# Patient Record
Sex: Male | Born: 1940 | Race: White | Hispanic: No | Marital: Married | Smoking: Former smoker
Health system: Southern US, Community
[De-identification: ages and names within clinical notes are randomized; demographics above are authoritative.]

## PROBLEM LIST (undated history)

## (undated) DIAGNOSIS — N183 Chronic kidney disease, stage 3 unspecified: Secondary | ICD-10-CM

## (undated) DIAGNOSIS — R519 Headache, unspecified: Secondary | ICD-10-CM

## (undated) DIAGNOSIS — M792 Neuralgia and neuritis, unspecified: Secondary | ICD-10-CM

## (undated) DIAGNOSIS — E785 Hyperlipidemia, unspecified: Secondary | ICD-10-CM

## (undated) DIAGNOSIS — I714 Abdominal aortic aneurysm, without rupture, unspecified: Secondary | ICD-10-CM

## (undated) DIAGNOSIS — I10 Essential (primary) hypertension: Secondary | ICD-10-CM

## (undated) DIAGNOSIS — C3490 Malignant neoplasm of unspecified part of unspecified bronchus or lung: Secondary | ICD-10-CM

## (undated) DIAGNOSIS — G44229 Chronic tension-type headache, not intractable: Secondary | ICD-10-CM

## (undated) DIAGNOSIS — R0600 Dyspnea, unspecified: Secondary | ICD-10-CM

## (undated) DIAGNOSIS — I723 Aneurysm of iliac artery: Secondary | ICD-10-CM

## (undated) DIAGNOSIS — Z951 Presence of aortocoronary bypass graft: Secondary | ICD-10-CM

## (undated) DIAGNOSIS — I729 Aneurysm of unspecified site: Secondary | ICD-10-CM

## (undated) DIAGNOSIS — E7800 Pure hypercholesterolemia, unspecified: Secondary | ICD-10-CM

## (undated) DIAGNOSIS — Z950 Presence of cardiac pacemaker: Secondary | ICD-10-CM

## (undated) DIAGNOSIS — S42402A Unspecified fracture of lower end of left humerus, initial encounter for closed fracture: Secondary | ICD-10-CM

## (undated) DIAGNOSIS — E538 Deficiency of other specified B group vitamins: Secondary | ICD-10-CM

## (undated) DIAGNOSIS — D649 Anemia, unspecified: Secondary | ICD-10-CM

## (undated) DIAGNOSIS — M1990 Unspecified osteoarthritis, unspecified site: Secondary | ICD-10-CM

## (undated) DIAGNOSIS — I441 Atrioventricular block, second degree: Secondary | ICD-10-CM

## (undated) DIAGNOSIS — I639 Cerebral infarction, unspecified: Secondary | ICD-10-CM

## (undated) DIAGNOSIS — M5136 Other intervertebral disc degeneration, lumbar region: Secondary | ICD-10-CM

## (undated) DIAGNOSIS — I519 Heart disease, unspecified: Secondary | ICD-10-CM

## (undated) DIAGNOSIS — Z860101 Personal history of adenomatous and serrated colon polyps: Secondary | ICD-10-CM

## (undated) DIAGNOSIS — I25810 Atherosclerosis of coronary artery bypass graft(s) without angina pectoris: Secondary | ICD-10-CM

## (undated) DIAGNOSIS — Z86010 Personal history of colonic polyps: Secondary | ICD-10-CM

## (undated) DIAGNOSIS — G459 Transient cerebral ischemic attack, unspecified: Secondary | ICD-10-CM

## (undated) DIAGNOSIS — M51369 Other intervertebral disc degeneration, lumbar region without mention of lumbar back pain or lower extremity pain: Secondary | ICD-10-CM

## (undated) DIAGNOSIS — I495 Sick sinus syndrome: Secondary | ICD-10-CM

## (undated) DIAGNOSIS — I2510 Atherosclerotic heart disease of native coronary artery without angina pectoris: Secondary | ICD-10-CM

## (undated) DIAGNOSIS — F419 Anxiety disorder, unspecified: Secondary | ICD-10-CM

## (undated) DIAGNOSIS — I38 Endocarditis, valve unspecified: Secondary | ICD-10-CM

## (undated) DIAGNOSIS — J449 Chronic obstructive pulmonary disease, unspecified: Secondary | ICD-10-CM

## (undated) DIAGNOSIS — Z872 Personal history of diseases of the skin and subcutaneous tissue: Secondary | ICD-10-CM

## (undated) DIAGNOSIS — I700 Atherosclerosis of aorta: Secondary | ICD-10-CM

## (undated) DIAGNOSIS — G3184 Mild cognitive impairment, so stated: Secondary | ICD-10-CM

## (undated) DIAGNOSIS — N261 Atrophy of kidney (terminal): Secondary | ICD-10-CM

## (undated) DIAGNOSIS — K219 Gastro-esophageal reflux disease without esophagitis: Secondary | ICD-10-CM

## (undated) DIAGNOSIS — Z902 Acquired absence of lung [part of]: Secondary | ICD-10-CM

## (undated) DIAGNOSIS — R51 Headache: Secondary | ICD-10-CM

## (undated) DIAGNOSIS — M4306 Spondylolysis, lumbar region: Secondary | ICD-10-CM

## (undated) DIAGNOSIS — C61 Malignant neoplasm of prostate: Secondary | ICD-10-CM

## (undated) DIAGNOSIS — Q383 Other congenital malformations of tongue: Secondary | ICD-10-CM

## (undated) DIAGNOSIS — G5710 Meralgia paresthetica, unspecified lower limb: Secondary | ICD-10-CM

## (undated) DIAGNOSIS — Z8719 Personal history of other diseases of the digestive system: Secondary | ICD-10-CM

## (undated) DIAGNOSIS — M5412 Radiculopathy, cervical region: Secondary | ICD-10-CM

## (undated) DIAGNOSIS — N200 Calculus of kidney: Secondary | ICD-10-CM

## (undated) HISTORY — DX: Anemia, unspecified: D64.9

## (undated) HISTORY — DX: Headache: R51

## (undated) HISTORY — PX: OTHER SURGICAL HISTORY: SHX169

## (undated) HISTORY — DX: Other intervertebral disc degeneration, lumbar region without mention of lumbar back pain or lower extremity pain: M51.369

## (undated) HISTORY — DX: Pure hypercholesterolemia, unspecified: E78.00

## (undated) HISTORY — DX: Neuralgia and neuritis, unspecified: M79.2

## (undated) HISTORY — DX: Atrioventricular block, second degree: I44.1

## (undated) HISTORY — PX: POSTERIOR LUMBAR FUSION: SHX6036

## (undated) HISTORY — PX: EYE SURGERY: SHX253

## (undated) HISTORY — DX: Gastro-esophageal reflux disease without esophagitis: K21.9

## (undated) HISTORY — PX: COLONOSCOPY: SHX174

## (undated) HISTORY — PX: CORONARY ARTERY BYPASS GRAFT: SHX141

## (undated) HISTORY — DX: Headache, unspecified: R51.9

## (undated) HISTORY — DX: Sick sinus syndrome: I49.5

## (undated) HISTORY — DX: Transient cerebral ischemic attack, unspecified: G45.9

## (undated) HISTORY — DX: Radiculopathy, cervical region: M54.12

## (undated) HISTORY — DX: Aneurysm of iliac artery: I72.3

## (undated) HISTORY — DX: Malignant neoplasm of unspecified part of unspecified bronchus or lung: C34.90

## (undated) HISTORY — DX: Atrophy of kidney (terminal): N26.1

## (undated) HISTORY — DX: Abdominal aortic aneurysm, without rupture: I71.4

## (undated) HISTORY — DX: Anxiety disorder, unspecified: F41.9

## (undated) HISTORY — PX: JOINT REPLACEMENT: SHX530

## (undated) HISTORY — DX: Malignant neoplasm of prostate: C61

## (undated) HISTORY — PX: PACEMAKER INSERTION: SHX728

## (undated) HISTORY — DX: Hyperlipidemia, unspecified: E78.5

## (undated) HISTORY — PX: PROSTATECTOMY: SHX69

## (undated) HISTORY — DX: Unspecified osteoarthritis, unspecified site: M19.90

## (undated) HISTORY — PX: INSERT / REPLACE / REMOVE PACEMAKER: SUR710

## (undated) HISTORY — DX: Meralgia paresthetica, unspecified lower limb: G57.10

## (undated) HISTORY — DX: Abdominal aortic aneurysm, without rupture, unspecified: I71.40

## (undated) HISTORY — PX: KNEE ARTHROSCOPY: SUR90

## (undated) HISTORY — PX: CATARACT EXTRACTION: SUR2

## (undated) HISTORY — PX: POLYPECTOMY: SHX149

## (undated) HISTORY — PX: TOTAL KNEE ARTHROPLASTY: SHX125

## (undated) HISTORY — DX: Heart disease, unspecified: I51.9

## (undated) HISTORY — DX: Cerebral infarction, unspecified: I63.9

## (undated) HISTORY — PX: TOTAL SHOULDER REPLACEMENT: SUR1217

## (undated) HISTORY — DX: Other intervertebral disc degeneration, lumbar region: M51.36

---

## 2004-04-29 ENCOUNTER — Other Ambulatory Visit: Payer: Self-pay

## 2004-05-01 DIAGNOSIS — Z951 Presence of aortocoronary bypass graft: Secondary | ICD-10-CM

## 2004-05-01 HISTORY — PX: CORONARY ARTERY BYPASS GRAFT: SHX141

## 2004-05-01 HISTORY — DX: Presence of aortocoronary bypass graft: Z95.1

## 2005-09-23 ENCOUNTER — Ambulatory Visit: Payer: Self-pay | Admitting: Internal Medicine

## 2006-11-03 ENCOUNTER — Ambulatory Visit: Payer: Self-pay | Admitting: Internal Medicine

## 2007-01-30 ENCOUNTER — Ambulatory Visit: Payer: Self-pay | Admitting: General Practice

## 2007-02-09 ENCOUNTER — Inpatient Hospital Stay: Payer: Self-pay | Admitting: General Practice

## 2007-02-13 ENCOUNTER — Encounter: Payer: Self-pay | Admitting: Internal Medicine

## 2007-03-06 ENCOUNTER — Encounter: Payer: Self-pay | Admitting: General Practice

## 2007-03-09 ENCOUNTER — Encounter: Payer: Self-pay | Admitting: General Practice

## 2007-03-09 ENCOUNTER — Encounter: Payer: Self-pay | Admitting: Internal Medicine

## 2007-04-09 ENCOUNTER — Encounter: Payer: Self-pay | Admitting: General Practice

## 2007-05-09 ENCOUNTER — Encounter: Payer: Self-pay | Admitting: General Practice

## 2007-06-09 ENCOUNTER — Encounter: Payer: Self-pay | Admitting: General Practice

## 2007-07-07 ENCOUNTER — Emergency Department: Payer: Self-pay | Admitting: Emergency Medicine

## 2008-01-26 ENCOUNTER — Emergency Department: Payer: Self-pay | Admitting: Unknown Physician Specialty

## 2008-02-01 ENCOUNTER — Ambulatory Visit: Payer: Self-pay | Admitting: General Practice

## 2008-02-20 ENCOUNTER — Encounter: Payer: Self-pay | Admitting: General Practice

## 2008-03-08 ENCOUNTER — Encounter: Payer: Self-pay | Admitting: General Practice

## 2008-04-23 ENCOUNTER — Ambulatory Visit: Payer: Self-pay | Admitting: Internal Medicine

## 2008-06-26 ENCOUNTER — Ambulatory Visit: Payer: Self-pay | Admitting: Unknown Physician Specialty

## 2009-09-17 ENCOUNTER — Ambulatory Visit: Payer: Self-pay | Admitting: Internal Medicine

## 2010-01-02 ENCOUNTER — Emergency Department: Payer: Self-pay | Admitting: Emergency Medicine

## 2010-01-14 ENCOUNTER — Ambulatory Visit: Payer: Self-pay | Admitting: Internal Medicine

## 2010-03-18 ENCOUNTER — Ambulatory Visit: Payer: Self-pay | Admitting: Internal Medicine

## 2010-10-26 ENCOUNTER — Inpatient Hospital Stay (HOSPITAL_COMMUNITY)
Admission: RE | Admit: 2010-10-26 | Discharge: 2010-10-27 | Payer: Self-pay | Source: Home / Self Care | Attending: Urology | Admitting: Urology

## 2010-10-26 ENCOUNTER — Encounter (INDEPENDENT_AMBULATORY_CARE_PROVIDER_SITE_OTHER): Payer: Self-pay | Admitting: Urology

## 2011-01-18 LAB — ABO/RH: ABO/RH(D): O POS

## 2011-01-18 LAB — BASIC METABOLIC PANEL
BUN: 20 mg/dL (ref 6–23)
CO2: 24 mEq/L (ref 19–32)
CO2: 25 mEq/L (ref 19–32)
Calcium: 8.9 mg/dL (ref 8.4–10.5)
Chloride: 103 mEq/L (ref 96–112)
Chloride: 106 mEq/L (ref 96–112)
Creatinine, Ser: 1.98 mg/dL — ABNORMAL HIGH (ref 0.4–1.5)
Creatinine, Ser: 2.02 mg/dL — ABNORMAL HIGH (ref 0.4–1.5)
GFR calc non Af Amer: 33 mL/min — ABNORMAL LOW (ref >60)
GFR calc non Af Amer: 36 mL/min — ABNORMAL LOW (ref >60)
Glucose, Bld: 107 mg/dL — ABNORMAL HIGH (ref 70–99)
Glucose, Bld: 169 mg/dL — ABNORMAL HIGH (ref 70–99)
Potassium: 4.3 mEq/L (ref 3.5–5.1)
Sodium: 138 mEq/L (ref 135–145)
Sodium: 140 mEq/L (ref 135–145)

## 2011-01-18 LAB — HEMOGLOBIN AND HEMATOCRIT, BLOOD
HCT: 33.5 % — ABNORMAL LOW (ref 39.0–52.0)
Hemoglobin: 12.6 g/dL — ABNORMAL LOW (ref 13.0–17.0)

## 2011-01-18 LAB — TYPE AND SCREEN
ABO/RH(D): O POS
Antibody Screen: NEGATIVE

## 2011-01-19 LAB — BASIC METABOLIC PANEL
CO2: 24 mEq/L (ref 19–32)
Calcium: 9.5 mg/dL (ref 8.4–10.5)
Chloride: 107 mEq/L (ref 96–112)
GFR calc Af Amer: 55 mL/min — ABNORMAL LOW (ref >60)
Sodium: 141 mEq/L (ref 135–145)

## 2011-01-19 LAB — CBC
MCH: 29.6 pg (ref 26.0–34.0)
RDW: 13.9 % (ref 11.5–15.5)

## 2011-01-19 LAB — SURGICAL PCR SCREEN
MRSA, PCR: NEGATIVE
Staphylococcus aureus: NEGATIVE

## 2011-04-22 ENCOUNTER — Ambulatory Visit: Payer: Self-pay | Admitting: Vascular Surgery

## 2011-07-15 ENCOUNTER — Ambulatory Visit: Payer: Self-pay | Admitting: General Practice

## 2011-07-29 ENCOUNTER — Inpatient Hospital Stay: Payer: Self-pay | Admitting: General Practice

## 2011-07-30 LAB — PATHOLOGY REPORT

## 2012-03-07 ENCOUNTER — Ambulatory Visit: Payer: Self-pay | Admitting: General Practice

## 2012-04-07 DIAGNOSIS — M5412 Radiculopathy, cervical region: Secondary | ICD-10-CM | POA: Insufficient documentation

## 2012-11-08 HISTORY — PX: LUNG REMOVAL, PARTIAL: SHX233

## 2012-11-16 ENCOUNTER — Ambulatory Visit: Payer: Self-pay | Admitting: Internal Medicine

## 2012-11-27 ENCOUNTER — Ambulatory Visit: Payer: Self-pay | Admitting: Nephrology

## 2012-12-09 HISTORY — PX: PACEMAKER INSERTION: SHX728

## 2012-12-20 ENCOUNTER — Ambulatory Visit: Payer: Self-pay | Admitting: Cardiology

## 2012-12-20 LAB — BASIC METABOLIC PANEL
BUN: 25 mg/dL — ABNORMAL HIGH (ref 7–18)
Chloride: 109 mmol/L — ABNORMAL HIGH (ref 98–107)
Co2: 26 mmol/L (ref 21–32)
Creatinine: 1.93 mg/dL — ABNORMAL HIGH (ref 0.60–1.30)
EGFR (Non-African Amer.): 34 — ABNORMAL LOW
Glucose: 92 mg/dL (ref 65–99)
Osmolality: 287 (ref 275–301)
Potassium: 4.7 mmol/L (ref 3.5–5.1)
Sodium: 142 mmol/L (ref 136–145)

## 2012-12-20 LAB — CBC WITH DIFFERENTIAL/PLATELET
Basophil %: 0.4 %
Eosinophil %: 0.7 %
MCHC: 33.3 g/dL (ref 32.0–36.0)
MCV: 91 fL (ref 80–100)
Monocyte #: 0.5 x10 3/mm (ref 0.2–1.0)
Neutrophil #: 5.9 10*3/uL (ref 1.4–6.5)
Platelet: 194 10*3/uL (ref 150–440)

## 2012-12-20 LAB — PROTIME-INR: Prothrombin Time: 12.6 secs (ref 11.5–14.7)

## 2012-12-26 ENCOUNTER — Ambulatory Visit: Payer: Self-pay | Admitting: Cardiology

## 2013-01-09 ENCOUNTER — Ambulatory Visit: Payer: Self-pay | Admitting: Internal Medicine

## 2013-01-10 ENCOUNTER — Ambulatory Visit: Payer: Self-pay | Admitting: Cardiothoracic Surgery

## 2013-01-16 ENCOUNTER — Ambulatory Visit: Payer: Self-pay | Admitting: Cardiothoracic Surgery

## 2013-01-17 ENCOUNTER — Ambulatory Visit: Payer: Self-pay | Admitting: Cardiothoracic Surgery

## 2013-01-17 LAB — PROTIME-INR: Prothrombin Time: 13.2 secs (ref 11.5–14.7)

## 2013-01-17 LAB — PLATELET COUNT: Platelet: 174 10*3/uL (ref 150–440)

## 2013-01-17 LAB — CREATININE, SERUM
EGFR (African American): 40 — ABNORMAL LOW
EGFR (Non-African Amer.): 35 — ABNORMAL LOW

## 2013-01-25 ENCOUNTER — Ambulatory Visit: Payer: Self-pay

## 2013-01-25 LAB — CBC WITH DIFFERENTIAL/PLATELET
Basophil %: 0.3 %
Eosinophil #: 0.1 10*3/uL (ref 0.0–0.7)
Eosinophil %: 1.5 %
HCT: 39.1 % — ABNORMAL LOW (ref 40.0–52.0)
HGB: 13 g/dL (ref 13.0–18.0)
MCH: 29.8 pg (ref 26.0–34.0)
MCHC: 33.3 g/dL (ref 32.0–36.0)
Monocyte #: 0.5 x10 3/mm (ref 0.2–1.0)
Neutrophil #: 5 10*3/uL (ref 1.4–6.5)
Platelet: 140 10*3/uL — ABNORMAL LOW (ref 150–440)
RDW: 14.1 % (ref 11.5–14.5)
WBC: 6.7 10*3/uL (ref 3.8–10.6)

## 2013-01-25 LAB — COMPREHENSIVE METABOLIC PANEL
Alkaline Phosphatase: 64 U/L (ref 50–136)
Bilirubin,Total: 0.4 mg/dL (ref 0.2–1.0)
Calcium, Total: 8.9 mg/dL (ref 8.5–10.1)
Co2: 28 mmol/L (ref 21–32)
Creatinine: 1.6 mg/dL — ABNORMAL HIGH (ref 0.60–1.30)
EGFR (African American): 49 — ABNORMAL LOW
Glucose: 115 mg/dL — ABNORMAL HIGH (ref 65–99)
Potassium: 4.4 mmol/L (ref 3.5–5.1)
SGPT (ALT): 24 U/L (ref 12–78)
Sodium: 139 mmol/L (ref 136–145)
Total Protein: 7.1 g/dL (ref 6.4–8.2)

## 2013-01-25 LAB — PROTIME-INR: Prothrombin Time: 12.9 secs (ref 11.5–14.7)

## 2013-01-25 LAB — APTT: Activated PTT: 28.1 secs (ref 23.6–35.9)

## 2013-01-31 ENCOUNTER — Inpatient Hospital Stay: Payer: Self-pay

## 2013-01-31 DIAGNOSIS — C3491 Malignant neoplasm of unspecified part of right bronchus or lung: Secondary | ICD-10-CM

## 2013-01-31 HISTORY — PX: LOBECTOMY: SHX5089

## 2013-01-31 HISTORY — PX: LUNG LOBECTOMY: SHX167

## 2013-01-31 HISTORY — DX: Malignant neoplasm of unspecified part of right bronchus or lung: C34.91

## 2013-01-31 LAB — CBC WITH DIFFERENTIAL/PLATELET
Eosinophil #: 0 10*3/uL (ref 0.0–0.7)
HCT: 36.2 % — ABNORMAL LOW (ref 40.0–52.0)
HGB: 11.9 g/dL — ABNORMAL LOW (ref 13.0–18.0)
RDW: 13.7 % (ref 11.5–14.5)
WBC: 13.8 10*3/uL — ABNORMAL HIGH (ref 3.8–10.6)

## 2013-02-01 LAB — CBC WITH DIFFERENTIAL/PLATELET
Basophil #: 0 10*3/uL (ref 0.0–0.1)
Eosinophil %: 0.2 %
HCT: 35.3 % — ABNORMAL LOW (ref 40.0–52.0)
Lymphocyte %: 12.7 %
MCH: 29.6 pg (ref 26.0–34.0)
MCHC: 34 g/dL (ref 32.0–36.0)
Neutrophil #: 6.9 10*3/uL — ABNORMAL HIGH (ref 1.4–6.5)
RBC: 4.05 10*6/uL — ABNORMAL LOW (ref 4.40–5.90)

## 2013-02-01 LAB — BASIC METABOLIC PANEL
Co2: 22 mmol/L (ref 21–32)
EGFR (Non-African Amer.): 41 — ABNORMAL LOW
Osmolality: 280 (ref 275–301)
Sodium: 138 mmol/L (ref 136–145)

## 2013-02-02 LAB — BASIC METABOLIC PANEL
Anion Gap: 6 — ABNORMAL LOW (ref 7–16)
BUN: 17 mg/dL (ref 7–18)
BUN: 18 mg/dL (ref 7–18)
Calcium, Total: 8.4 mg/dL — ABNORMAL LOW (ref 8.5–10.1)
Chloride: 106 mmol/L (ref 98–107)
Chloride: 105 mmol/L (ref 98–107)
Co2: 23 mmol/L (ref 21–32)
Creatinine: 1.62 mg/dL — ABNORMAL HIGH (ref 0.60–1.30)
EGFR (Non-African Amer.): 42 — ABNORMAL LOW
EGFR (Non-African Amer.): 45 — ABNORMAL LOW
Potassium: 4.2 mmol/L (ref 3.5–5.1)

## 2013-02-02 LAB — CBC WITH DIFFERENTIAL/PLATELET
Eosinophil #: 0.1 10*3/uL (ref 0.0–0.7)
HCT: 35.1 % — ABNORMAL LOW (ref 40.0–52.0)
MCH: 29.5 pg (ref 26.0–34.0)
Neutrophil %: 78.2 %
RDW: 13.9 % (ref 11.5–14.5)
WBC: 11.1 10*3/uL — ABNORMAL HIGH (ref 3.8–10.6)

## 2013-02-02 LAB — MAGNESIUM: Magnesium: 1.6 mg/dL — ABNORMAL LOW

## 2013-02-03 LAB — CBC WITH DIFFERENTIAL/PLATELET
Basophil #: 0 10*3/uL (ref 0.0–0.1)
Basophil %: 0.3 %
HCT: 33.9 % — ABNORMAL LOW (ref 40.0–52.0)
HGB: 11.4 g/dL — ABNORMAL LOW (ref 13.0–18.0)
MCH: 29.4 pg (ref 26.0–34.0)
MCHC: 33.6 g/dL (ref 32.0–36.0)
MCV: 88 fL (ref 80–100)
Monocyte #: 0.7 x10 3/mm (ref 0.2–1.0)
Neutrophil %: 79.3 %
Platelet: 146 10*3/uL — ABNORMAL LOW (ref 150–440)
RBC: 3.87 10*6/uL — ABNORMAL LOW (ref 4.40–5.90)
RDW: 13.9 % (ref 11.5–14.5)

## 2013-02-03 LAB — BASIC METABOLIC PANEL
Anion Gap: 6 — ABNORMAL LOW (ref 7–16)
BUN: 19 mg/dL — ABNORMAL HIGH (ref 7–18)
Calcium, Total: 8.4 mg/dL — ABNORMAL LOW (ref 8.5–10.1)
Creatinine: 1.42 mg/dL — ABNORMAL HIGH (ref 0.60–1.30)
Glucose: 109 mg/dL — ABNORMAL HIGH (ref 65–99)
Osmolality: 277 (ref 275–301)
Potassium: 4.1 mmol/L (ref 3.5–5.1)

## 2013-02-05 LAB — BASIC METABOLIC PANEL
Anion Gap: 6 — ABNORMAL LOW (ref 7–16)
Chloride: 106 mmol/L (ref 98–107)
Co2: 27 mmol/L (ref 21–32)
EGFR (Non-African Amer.): 56 — ABNORMAL LOW
Glucose: 105 mg/dL — ABNORMAL HIGH (ref 65–99)
Potassium: 3.5 mmol/L (ref 3.5–5.1)
Sodium: 139 mmol/L (ref 136–145)

## 2013-02-06 ENCOUNTER — Ambulatory Visit: Payer: Self-pay | Admitting: Cardiothoracic Surgery

## 2013-02-06 ENCOUNTER — Ambulatory Visit: Payer: Self-pay | Admitting: Oncology

## 2013-02-06 LAB — BASIC METABOLIC PANEL
BUN: 16 mg/dL (ref 7–18)
Chloride: 105 mmol/L (ref 98–107)
Creatinine: 1.26 mg/dL (ref 0.60–1.30)
EGFR (African American): >60
Glucose: 110 mg/dL — ABNORMAL HIGH (ref 65–99)
Osmolality: 279 (ref 275–301)
Potassium: 3.9 mmol/L (ref 3.5–5.1)

## 2013-02-06 LAB — CBC WITH DIFFERENTIAL/PLATELET
Basophil #: 0 10*3/uL (ref 0.0–0.1)
Basophil %: 0.5 %
HCT: 33.1 % — ABNORMAL LOW (ref 40.0–52.0)
HGB: 11.3 g/dL — ABNORMAL LOW (ref 13.0–18.0)
Lymphocyte %: 17.7 %
MCH: 29.9 pg (ref 26.0–34.0)
MCV: 88 fL (ref 80–100)
Monocyte #: 0.7 x10 3/mm (ref 0.2–1.0)
Monocyte %: 9.1 %
Neutrophil %: 69.9 %
Platelet: 189 10*3/uL (ref 150–440)
RDW: 14.1 % (ref 11.5–14.5)
WBC: 7.4 10*3/uL (ref 3.8–10.6)

## 2013-02-06 LAB — MAGNESIUM: Magnesium: 2 mg/dL

## 2013-03-08 ENCOUNTER — Ambulatory Visit: Payer: Self-pay | Admitting: Cardiothoracic Surgery

## 2013-04-23 LAB — COMPREHENSIVE METABOLIC PANEL
Anion Gap: 7 (ref 7–16)
Chloride: 107 mmol/L (ref 98–107)
Co2: 26 mmol/L (ref 21–32)
Creatinine: 1.74 mg/dL — ABNORMAL HIGH (ref 0.60–1.30)
Glucose: 88 mg/dL (ref 65–99)
Osmolality: 284 (ref 275–301)
Potassium: 4.2 mmol/L (ref 3.5–5.1)
Sodium: 140 mmol/L (ref 136–145)
Total Protein: 6.9 g/dL (ref 6.4–8.2)

## 2013-04-23 LAB — CBC
HCT: 38.1 % — ABNORMAL LOW (ref 40.0–52.0)
HGB: 12.7 g/dL — ABNORMAL LOW (ref 13.0–18.0)
MCH: 29.2 pg (ref 26.0–34.0)
MCHC: 33.3 g/dL (ref 32.0–36.0)
WBC: 6.9 10*3/uL (ref 3.8–10.6)

## 2013-04-23 LAB — TROPONIN I: Troponin-I: <0.02 ng/mL

## 2013-04-23 LAB — CK TOTAL AND CKMB (NOT AT ARMC)
CK, Total: 68 U/L (ref 35–232)
CK-MB: 3.2 ng/mL (ref 0.5–3.6)

## 2013-04-24 ENCOUNTER — Observation Stay: Payer: Self-pay | Admitting: Internal Medicine

## 2013-04-24 LAB — CK TOTAL AND CKMB (NOT AT ARMC)
CK, Total: 54 U/L (ref 35–232)
CK-MB: 2.1 ng/mL (ref 0.5–3.6)

## 2013-04-24 LAB — TROPONIN I: Troponin-I: <0.02 ng/mL

## 2013-06-27 ENCOUNTER — Ambulatory Visit: Payer: Self-pay | Admitting: Oncology

## 2013-06-27 LAB — COMPREHENSIVE METABOLIC PANEL
Alkaline Phosphatase: 61 U/L (ref 50–136)
Anion Gap: 3 — ABNORMAL LOW (ref 7–16)
BUN: 29 mg/dL — ABNORMAL HIGH (ref 7–18)
Calcium, Total: 9.2 mg/dL (ref 8.5–10.1)
Creatinine: 1.74 mg/dL — ABNORMAL HIGH (ref 0.60–1.30)
EGFR (African American): 44 — ABNORMAL LOW
Glucose: 74 mg/dL (ref 65–99)
SGOT(AST): 19 U/L (ref 15–37)
SGPT (ALT): 28 U/L (ref 12–78)
Sodium: 140 mmol/L (ref 136–145)

## 2013-06-27 LAB — CBC CANCER CENTER
Basophil #: 0 x10 3/mm (ref 0.0–0.1)
HCT: 38.5 % — ABNORMAL LOW (ref 40.0–52.0)
MCH: 30.5 pg (ref 26.0–34.0)
MCHC: 34.4 g/dL (ref 32.0–36.0)
Monocyte #: 0.6 x10 3/mm (ref 0.2–1.0)
Monocyte %: 11.5 %
Platelet: 139 x10 3/mm — ABNORMAL LOW (ref 150–440)
RBC: 4.35 10*6/uL — ABNORMAL LOW (ref 4.40–5.90)
RDW: 15 % — ABNORMAL HIGH (ref 11.5–14.5)
WBC: 5.2 x10 3/mm (ref 3.8–10.6)

## 2013-07-09 ENCOUNTER — Ambulatory Visit: Payer: Self-pay | Admitting: Oncology

## 2013-09-24 ENCOUNTER — Ambulatory Visit: Payer: Self-pay | Admitting: Oncology

## 2013-09-26 ENCOUNTER — Ambulatory Visit: Payer: Self-pay | Admitting: Oncology

## 2013-10-08 ENCOUNTER — Ambulatory Visit: Payer: Self-pay | Admitting: Oncology

## 2014-02-07 ENCOUNTER — Ambulatory Visit: Payer: Self-pay | Admitting: Unknown Physician Specialty

## 2014-02-08 LAB — PATHOLOGY REPORT

## 2014-03-26 ENCOUNTER — Ambulatory Visit: Payer: Self-pay | Admitting: Oncology

## 2014-03-27 LAB — CBC CANCER CENTER
Basophil #: 0 x10 3/mm (ref 0.0–0.1)
Basophil %: 0.4 %
Eosinophil #: 0.1 x10 3/mm (ref 0.0–0.7)
Eosinophil %: 1.9 %
HCT: 39.5 % — ABNORMAL LOW (ref 40.0–52.0)
HGB: 13.4 g/dL (ref 13.0–18.0)
Lymphocyte #: 1.3 x10 3/mm (ref 1.0–3.6)
Lymphocyte %: 26 %
MCH: 31 pg (ref 26.0–34.0)
MCHC: 34 g/dL (ref 32.0–36.0)
MCV: 91 fL (ref 80–100)
Monocyte #: 0.5 x10 3/mm (ref 0.2–1.0)
Monocyte %: 9.2 %
Neutrophil #: 3.2 x10 3/mm (ref 1.4–6.5)
Neutrophil %: 62.5 %
Platelet: 143 x10 3/mm — ABNORMAL LOW (ref 150–440)
RBC: 4.34 10*6/uL — ABNORMAL LOW (ref 4.40–5.90)
RDW: 13.7 % (ref 11.5–14.5)
WBC: 5.1 x10 3/mm (ref 3.8–10.6)

## 2014-03-27 LAB — COMPREHENSIVE METABOLIC PANEL
ALBUMIN: 3.6 g/dL (ref 3.4–5.0)
AST: 17 U/L (ref 15–37)
Alkaline Phosphatase: 63 U/L
Anion Gap: 6 — ABNORMAL LOW (ref 7–16)
BILIRUBIN TOTAL: 0.5 mg/dL (ref 0.2–1.0)
BUN: 20 mg/dL — AB (ref 7–18)
CHLORIDE: 107 mmol/L (ref 98–107)
Calcium, Total: 9.1 mg/dL (ref 8.5–10.1)
Co2: 30 mmol/L (ref 21–32)
Creatinine: 1.66 mg/dL — ABNORMAL HIGH (ref 0.60–1.30)
EGFR (African American): 47 — ABNORMAL LOW
EGFR (Non-African Amer.): 40 — ABNORMAL LOW
GLUCOSE: 102 mg/dL — AB (ref 65–99)
Osmolality: 288 (ref 275–301)
Potassium: 4.4 mmol/L (ref 3.5–5.1)
SGPT (ALT): 27 U/L (ref 12–78)
Sodium: 143 mmol/L (ref 136–145)
Total Protein: 6.7 g/dL (ref 6.4–8.2)

## 2014-04-08 ENCOUNTER — Ambulatory Visit: Payer: Self-pay | Admitting: Oncology

## 2014-08-01 DIAGNOSIS — M7530 Calcific tendinitis of unspecified shoulder: Secondary | ICD-10-CM | POA: Insufficient documentation

## 2014-10-09 ENCOUNTER — Ambulatory Visit: Payer: Self-pay | Admitting: Oncology

## 2014-10-09 LAB — COMPREHENSIVE METABOLIC PANEL
Albumin: 3.6 g/dL (ref 3.4–5.0)
Alkaline Phosphatase: 60 U/L
Anion Gap: 8 (ref 7–16)
BILIRUBIN TOTAL: 0.5 mg/dL (ref 0.2–1.0)
BUN: 21 mg/dL — ABNORMAL HIGH (ref 7–18)
CALCIUM: 9.1 mg/dL (ref 8.5–10.1)
CHLORIDE: 105 mmol/L (ref 98–107)
CO2: 29 mmol/L (ref 21–32)
Creatinine: 1.54 mg/dL — ABNORMAL HIGH (ref 0.60–1.30)
EGFR (Non-African Amer.): 47 — ABNORMAL LOW
GFR CALC AF AMER: 57 — AB
Glucose: 79 mg/dL (ref 65–99)
Osmolality: 285 (ref 275–301)
POTASSIUM: 4.5 mmol/L (ref 3.5–5.1)
SGOT(AST): 16 U/L (ref 15–37)
SGPT (ALT): 26 U/L
Sodium: 142 mmol/L (ref 136–145)
TOTAL PROTEIN: 6.6 g/dL (ref 6.4–8.2)

## 2014-10-09 LAB — CBC CANCER CENTER
BASOS PCT: 0.3 %
Basophil #: 0 x10 3/mm (ref 0.0–0.1)
Eosinophil #: 0.1 x10 3/mm (ref 0.0–0.7)
Eosinophil %: 2.3 %
HCT: 41.6 % (ref 40.0–52.0)
HGB: 13.7 g/dL (ref 13.0–18.0)
LYMPHS ABS: 1.2 x10 3/mm (ref 1.0–3.6)
LYMPHS PCT: 21.6 %
MCH: 30.8 pg (ref 26.0–34.0)
MCHC: 32.9 g/dL (ref 32.0–36.0)
MCV: 94 fL (ref 80–100)
Monocyte #: 0.6 x10 3/mm (ref 0.2–1.0)
Monocyte %: 10 %
NEUTROS ABS: 3.7 x10 3/mm (ref 1.4–6.5)
Neutrophil %: 65.8 %
Platelet: 147 x10 3/mm — ABNORMAL LOW (ref 150–440)
RBC: 4.44 10*6/uL (ref 4.40–5.90)
RDW: 13.8 % (ref 11.5–14.5)
WBC: 5.6 x10 3/mm (ref 3.8–10.6)

## 2014-10-16 ENCOUNTER — Ambulatory Visit: Payer: Self-pay | Admitting: Ophthalmology

## 2014-10-24 ENCOUNTER — Ambulatory Visit: Payer: Self-pay | Admitting: Oncology

## 2014-10-25 ENCOUNTER — Emergency Department: Payer: Self-pay | Admitting: Emergency Medicine

## 2014-10-25 LAB — CBC WITH DIFFERENTIAL/PLATELET
Basophil #: 0 10*3/uL (ref 0.0–0.1)
Basophil %: 0.3 %
EOS ABS: 0.2 10*3/uL (ref 0.0–0.7)
Eosinophil %: 2.7 %
HCT: 38.8 % — AB (ref 40.0–52.0)
HGB: 12.8 g/dL — AB (ref 13.0–18.0)
LYMPHS ABS: 0.9 10*3/uL — AB (ref 1.0–3.6)
Lymphocyte %: 16.3 %
MCH: 31.2 pg (ref 26.0–34.0)
MCHC: 33 g/dL (ref 32.0–36.0)
MCV: 95 fL (ref 80–100)
MONOS PCT: 8.8 %
Monocyte #: 0.5 x10 3/mm (ref 0.2–1.0)
Neutrophil #: 4.2 10*3/uL (ref 1.4–6.5)
Neutrophil %: 71.9 %
Platelet: 153 10*3/uL (ref 150–440)
RBC: 4.11 10*6/uL — ABNORMAL LOW (ref 4.40–5.90)
RDW: 13.5 % (ref 11.5–14.5)
WBC: 5.8 10*3/uL (ref 3.8–10.6)

## 2014-10-25 LAB — URINALYSIS, COMPLETE
BLOOD: NEGATIVE
Bacteria: NONE SEEN
Bilirubin,UR: NEGATIVE
Glucose,UR: NEGATIVE mg/dL (ref 0–75)
Ketone: NEGATIVE
Leukocyte Esterase: NEGATIVE
NITRITE: NEGATIVE
PROTEIN: NEGATIVE
Ph: 5 (ref 4.5–8.0)
SPECIFIC GRAVITY: 1.015 (ref 1.003–1.030)
Squamous Epithelial: 1
WBC UR: <1 /HPF (ref 0–5)

## 2014-10-25 LAB — COMPREHENSIVE METABOLIC PANEL
ALBUMIN: 3.2 g/dL — AB (ref 3.4–5.0)
Alkaline Phosphatase: 68 U/L
Anion Gap: 6 — ABNORMAL LOW (ref 7–16)
BILIRUBIN TOTAL: 0.3 mg/dL (ref 0.2–1.0)
BUN: 20 mg/dL — AB (ref 7–18)
CO2: 28 mmol/L (ref 21–32)
CREATININE: 1.64 mg/dL — AB (ref 0.60–1.30)
Calcium, Total: 8.6 mg/dL (ref 8.5–10.1)
Chloride: 108 mmol/L — ABNORMAL HIGH (ref 98–107)
EGFR (Non-African Amer.): 44 — ABNORMAL LOW
GFR CALC AF AMER: 53 — AB
Glucose: 108 mg/dL — ABNORMAL HIGH (ref 65–99)
Osmolality: 286 (ref 275–301)
Potassium: 4.1 mmol/L (ref 3.5–5.1)
SGOT(AST): 21 U/L (ref 15–37)
SGPT (ALT): 24 U/L
Sodium: 142 mmol/L (ref 136–145)
Total Protein: 6.2 g/dL — ABNORMAL LOW (ref 6.4–8.2)

## 2014-10-25 LAB — PROTIME-INR
INR: 1
Prothrombin Time: 13.1 secs (ref 11.5–14.7)

## 2014-10-25 LAB — TROPONIN I: Troponin-I: <0.02 ng/mL

## 2014-10-25 LAB — APTT: ACTIVATED PTT: 27.8 s (ref 23.6–35.9)

## 2014-11-04 ENCOUNTER — Ambulatory Visit: Payer: Self-pay | Admitting: Internal Medicine

## 2014-11-08 ENCOUNTER — Ambulatory Visit: Payer: Self-pay | Admitting: Oncology

## 2014-11-21 DIAGNOSIS — G939 Disorder of brain, unspecified: Secondary | ICD-10-CM | POA: Insufficient documentation

## 2014-11-21 DIAGNOSIS — I6782 Cerebral ischemia: Secondary | ICD-10-CM | POA: Insufficient documentation

## 2014-12-04 ENCOUNTER — Ambulatory Visit: Payer: Self-pay | Admitting: Ophthalmology

## 2014-12-12 ENCOUNTER — Encounter: Payer: Self-pay | Admitting: Neurology

## 2015-01-07 ENCOUNTER — Encounter
Admit: 2015-01-07 | Disposition: A | Discharge status: Home / Self Care | Payer: Self-pay | Attending: Neurology | Admitting: Neurology

## 2015-01-15 DIAGNOSIS — Z86010 Personal history of colonic polyps: Secondary | ICD-10-CM | POA: Insufficient documentation

## 2015-02-07 ENCOUNTER — Encounter
Admit: 2015-02-07 | Disposition: A | Discharge status: Home / Self Care | Payer: Self-pay | Attending: Neurology | Admitting: Neurology

## 2015-02-28 NOTE — H&P (Signed)
PATIENT NAME:  Rick Mcbride, Rick Mcbride MR#:  [id number] DATE OF BIRTH:  August 07, 1941  DATE OF ADMISSION:  04/24/2013  PRIMARY CARE PHYSICIAN: Fult[place] Reek.   REFERRING PHYSICIAN: Dr. Dahlia Client.   CHIEF COMPLAINT: Right arm pain.   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   The patient states had heavy work around the house today. Went to take a shovel. Started to experience heaviness in the right arm which has gradually worsened. At the same time also started to have some heaviness or numbness in the right leg. The patient came out of the shovel,  continued to have this weakness. Concerning this, EMS was called and he was brought to the Emergency Department. Work-up in the Emergency Department was with CT head without contrast, which was unremarkable. The patient has a history of CVA, which was found incidentally. Denies having any chest pain, palpitations, nausea, vomiting. Symptoms were completely resolved by the time the patient came to the Emergency Department.   PAST MEDICAL HISTORY: 1.  Osteoarthritis days.  2.  Anxiety disorder.  3.  Coronary artery disease, status post CABG.  4.  Biatrial lead replacements.  5.  Radical prostatectomy.  6.  Chronic kidney disease.  7.  Degenerative disk disease, with lumbar radiculopathy.  8.  Hyperlipidemia.  9.  Hypertension.  10.  Tension headaches.  11.   Meralgia paresthetica.   ALLERGIES: No known drug allergies.   HOME MEDICATIONS: 1.  Ambien 10 mg, once a day at bedtime.  2.  Vitamin B6, 1 tablet once daily.  3.  Tylenol 650 mg every 4 hours as needed.  4.  Trazodone 100 mg at bedtime.  5.  Demerol 50 mg every 6 hours as needed.  6.  Spectravite 1  tablet once a day.  7.  Refresh, 1 drop as needed.  8.  Protecta Vision 1 tablet 2 times a day.  9.  MiraLAX 17 grams as needed.  10.  Plavix 75 mg once a day.  11. Elavil 20 mg once a day.  12.  Niaspan ER 500 mg once a day.  13.  Mucinex 600 mg every 12 hours.  14.  Lisinopril 5 mg once a day.  15.  Cetirizine 10 mg once a day.  16.  Carisoprodol 250 mg 4 times a day as needed.  17.  Calcium 600, with vitamin D, 1 tablet once a day.  18.  Atorvastatin 40 mg once a day.   SOCIAL HISTORY: Smoked heavily in the past, 1 pack a day. Quit in 2013. Denies drinking alcohol or using illicit drugs. He is married. Lives with his wife.   FAMILY HISTORY: Father had a CVA, and died from a MI.   REVIEW OF SYSTEMS:  CONSTITUTIONAL: No generalized weakness, weight loss. EYES: No change in vision.  ENT: No change in hearing. No tinnitus.  RESPIRATORY: No cough, shortness of breath.  CARDIOVASCULAR: No chest pain, palpitations.  GASTROINTESTINAL: No nausea, vomiting, abdominal pain.  GENITOURINARY: No dysuria or hematuria.  SKIN: No rash or lesions.  HEMATOLOGIC: No easy bruising or bleeding.  MUSCULOSKELETAL: Has severe osteoarthritis.   NEUROLOGIC: No weakness or numbness at this time, however experienced right arm weakness and right arm heaviness.   PHYSICAL EXAMINATION: This is a  well-built,  well-nourished, age-appropriate male laying down in the bed, not in distress.  VITAL SIGNS: Temperature 98.7, pulse 85, blood pressure 138/72, respiratory rate of 18, oxygen saturations 100% on room air.  HEENT: Head normocephalic, atraumatic.  EYES: No scleral icterus. Conjunctivae normal. Pupils equal and reactive to light. Extraocular movements are intact. Mucous membranes moist.  NECK: Supple. No lymphadenopathy. No JVD. No carotid bruit. No thyromegaly.  CHEST: Has no focal tenderness. Bilaterally clear to auscultation.  HEART: S1, S2 regular. No murmurs are heard. No pedal edema. Pulses 2+.   ABDOMEN: Bowel sounds present. Soft, nontender, nondistended No hepatosplenomegaly.  SKIN: No rash or lesions.  MUSCULOSKELETAL: Has decreased range of motion in the left shoulder.  NEUROLOGIC: The patient is alert, oriented to place, person and time. Cranial nerves  II-XII intact. Motor 5/5 in upper and lower extremities.   LABS: CMP: BUN 28, creatinine of 1.74. The rest of the values are within normal limits.   CT head without contrast: Stable changes. Diffuse atrophy in the periventricular and subcortical white matter, consistent with chronic microvascular disease.   CBC is completely within normal limits.   EKG, 12-lead: Normal sinus rhythm, with no ST-T wave abnormalities.   ASSESSMENT AND PLAN: Rick Mcbride is a 74 year old male who comes to the Emergency Department with right arm weakness and heaviness.   1.  Transient ischemic attack: CT head shows chronic microvascular changes. Will do further workup considering the patient's risk factors and previous history of cerebrovascular accident. Will obtain MRI of the brain, echocardiogram and carotid Dopplers. The patient is on Plavix. Returns to add aspirin to his regimen. If workup is negative, the patient could be discharged home.  2. Hypertension: Currently well-controlled. Continue with home medications.   3.  Chronic kidney disease: patient's baseline. Will need close follow-up.  4.  Keep the patient on deep vein thrombosis prophylaxis with Lovenox.   Time spent: Forty-five minutes.    ____________________________ Monica Becton, MD pv:dm D: 04/24/2013 06:45:45 ET T: 04/24/2013 08:24:53 ET JOB#: [id number]  cc: Monica Becton, MD, <Dictator> Leonie Douglas. Doy Hutching, MD Monica Becton MD ELECTRONICALLY SIGNED 04/28/2013 14:34

## 2015-02-28 NOTE — Discharge Summary (Signed)
PATIENT NAME:  Rick Mcbride, Rick Mcbride MR#:  [id number] DATE OF BIRTH:  02/24/1941  DATE OF ADMISSION:  01/31/2013 DATE OF DISCHARGE:  02/06/2013  ADMITTING DIAGNOSIS:  Right lower lobe mass.   DISCHARGE DIAGNOSIS:  Squamous cell carcinoma of the lung.   HOSPITAL COURSE:  The patient is a 74 year old gentleman who was recently identified as having a right lower lobe mass highly suspicious for malignancy. He was brought to the operating room on January 31, 2013 at which time he underwent a right thoracotomy and right lower lobectomy. The final pathology did reveal a squamous cell carcinoma of the lung with no evidence of distant disease. The final stage was T1b N0 M0. The patient did well overnight in the Intensive Care Unit after surgery and he was subsequently transferred to the floor. His postoperative course was essentially unremarkable and he had his chest tubes removed on the day prior to his discharge. At the time of discharge, his wounds were healing as expected. He had no complicating features of his postoperative course. At the time of discharge, his medications included tramadol for pain as well as all of his usual home medications. These will be listed separately in his discharge summary.   The patient was scheduled to follow up with Dr. Genevive Bi in 1 week. He will have a chest x-ray made at that time.    ____________________________ Lew Dawes Genevive Bi, MD teo:si D: 02/15/2013 15:20:32 ET T: 02/15/2013 16:19:41 ET JOB#: [id number]  cc: Christia Reading E. Genevive Bi, MD, <Dictator> Louis Matte MD ELECTRONICALLY SIGNED 02/20/2013 11:45

## 2015-02-28 NOTE — Op Note (Signed)
PATIENT NAME:  Rick Mcbride, SIEFRING MR#:  [id number] DATE OF BIRTH:  01-26-1941  DATE OF PROCEDURE:  01/31/2013  SURGEON:  [place]  ASSISTANT:  Bronson Ing, MD  PREOPERATIVE DIAGNOSIS: Right lower lobe mass.  POSTOPERATIVE DIAGNOSIS:  Right lower lobe mass (frozen section consistent with squamous cell carcinoma).   OPERATIONS PERFORMED: 1.  Preoperative bronchoscopy to assess endobronchial anatomy.  2.  Right thoracotomy with right lower lobectomy.   INDICATIONS FOR PROCEDURE: The patient is a 74 year old gentleman with a recently discovered right lower lobe mass. This was PET positive and an attempt at a biopsy was unsuccessful. He was offered the above-named procedure after undergoing extensive evaluation. He gave his informed consent.   DESCRIPTION OF PROCEDURE: The patient was brought to the operating suite and placed in the supine position. General endotracheal anesthesia was given with a double-lumen tube. Preoperative bronchoscopy was carried out. This was normal to the subsegmental levels bilaterally. The patient was then turned for a right thoracotomy. All pressure points were carefully padded. The patient was prepped and draped in the usual sterile fashion. A posterolateral 5th interspace thoracotomy was performed. The latissimus muscle was divided but the serratus was spared. The chest was entered. There were some adhesions present throughout the chest and these were taken down using electrocautery. The inferior pulmonary ligament was freed up to the level of the inferior pulmonary vein. At that point we could then see that there was a palpable mass measuring approximately 2.5 cm in the lower lobe. This was wedged and sent for frozen section. Frozen section was consistent with squamous cell carcinoma. We elected to proceed on with a right lower lobectomy. The fissure between the lower lobe and the middle and upper lobes was incomplete. We began by identifying the pulmonary artery in the  depths of the fissure and then used a stapling device to complete the fissure posteriorly between the upper and lower lobes and anteriorly between the upper and middle lobes. Care was taken to avoid injury to any of the other vascular structures. The pulmonary artery consisted of 2 large branches. The superior segmental branch quickly bifurcated as did the basilar branch. These were taken separately with vascular staplers. The inferior pulmonary vein was encircled and also taken with a vascular stapler. The only remaining structure at this point was the bronchus. Because of its short length, it immediately bifurcated into the superior segmental bronchus and the basilar bronchi and these were taken separately, again with an endoscopic stapler. The middle lobe and upper lobe were nearly completely fused. We checked the bronchial stumps at 30 cm of water pressure and there was no air leak. There was some air leak along the fissures. 4 mL of ProGEL were used on these areas. The chest was drained with an angled and a straight 28-French chest tube brought out through separate stab wounds inferiorly. The subcarinal space was opened and a single lymph node was available. The pretracheal space was opened and despite our attempting to find any lymph nodes, there were none that could be identified. The chest was then closed in standard fashion. #2 Vicryl paracostal sutures approximated the ribs. The latissimus muscle was approximated with #2 Vicryl, the subcutaneous tissues with 2-0 Vicryl and the skin with skin clips. The prior access port was closed with 0 Vicryl with 2-0 Vicryl and nylon. The patient tolerated the procedure well, was awakened from general endotracheal anesthesia and was transported to the recovery room in stable condition.    ____________________________  Azure Barrales E. Genevive Bi, MD teo:ce D: 01/31/2013 18:30:04 ET T: 01/31/2013 18:49:08 ET JOB#: [id number]  cc: Christia Reading E. Genevive Bi, MD, <Dictator>  Dr. Fulton Reek  Dr. Nehemiah Massed, cardiology   Louis Matte MD ELECTRONICALLY SIGNED 02/13/2013 14:06

## 2015-02-28 NOTE — Op Note (Signed)
PATIENT NAME:  Rick Mcbride, Rick Mcbride MR#:  [id number] DATE OF BIRTH:  08/11/1941  DATE OF PROCEDURE:  12/26/2012  PRIMARY CARE PHYSICIAN:  [place]  PREPROCEDURE DIAGNOSIS:  Mobitz II second-degree AV block.  PROCEDURE:  Dual-chamber pacemaker implantation.  POSTPROCEDURE DIAGNOSIS:  Atrial sensing with ventricular pacing.  INDICATIONS:  The patient is a 74 year old gentleman who recently experienced a brief syncopal episode.  Holter monitor episodes of Mobitz II second-degree AV block.  Procedure, risks, benefits and alternatives of permanent pacemaker implantation were explained to the patient and informed written consent was obtained.  He was brought to the operating room in a fasting state. The left pectoral region was prepped and draped in the usual sterile manner. Anesthesia was obtained with 1% Xylocaine locally. A 6 cm incision was performed over the left pectoral region. Pacemaker pocket was generated by electrocautery and blunt dissection.  [employer]onic ventricular [phone]) and atrial ([id number]) leads were positioned at the right ventricular apex and right atrial appendage under fluoroscopic guidance. After proper thresholds were obtained, the leads were sutured in place. The pacemaker pocket was irrigated with gentamine solution. The leads were then connected to a dual chamber rate responsive pacemaker generator (Medtronic Adapta [id number]) and positioned into the pocket. The pocket was closed with 2-0 and 4-0 Vicryl, respectively. Steri-Strips and pressure dressing were applied.   ____________________________ Isaias Cowman, MD ap:ce D: 12/26/2012 13:36:00 ET T: 12/26/2012 14:00:37 ET JOB#: [id number]  cc: Isaias Cowman, MD, <Dictator> Isaias Cowman MD ELECTRONICALLY SIGNED 01/02/2013 15:00

## 2015-02-28 NOTE — Consult Note (Signed)
   Present Illness 74 y/o male with hx of CAD- s/p CABG, HTN,Hypelipidemia, Hx of AAA, Recent pacemaker for second degree Av Block - underwent Thoracotomy and Rt lower lobe resection for lung mass earlier today/\. C/o Chills and pain in Rt shoulder. Otherwise alert and awake and does not appear to be in distress    No Known Allergies:   Case History and Physical Exam:  Past Medical Health Coronary Artery Disease, Hypertension, Cancer, hyperlipidemia   Past Surgical History Coronary Artery Bypass Graft  pacemaker   HEENT PERLA   Chest/Lungs Clear  Rt chest tube. No air leak   Cardiovascular No Murmurs or Gallops   Abdomen Benign   Skin Warm   Nursing/Ancillary Notes: **Vital Signs.:   26-Mar-14 18:52  Vital Signs Type Admission  Pulse Pulse 74  Respirations Respirations 8  Systolic BP Systolic BP 97  Diastolic BP (mmHg) Diastolic BP (mmHg) 63  Mean BP 74  Pulse Ox % Pulse Ox % 100  Pulse Ox Activity Level  At rest  Oxygen Delivery 2L  Pulse Ox Heart Rate 74   XRay:    26-Mar-14 15:56, Chest Portable Single View  Chest Portable Single View   PRELIMINARY REPORT    The following is a PRELIMINARY Radiology report.  A final report will follow pending radiologist verification.      REASON FOR EXAM:    assess for pleural effusion  COMMENTS:       PROCEDURE: DXR- DXR PORTABLE CHEST SINGLE VIEW  - Jan 31 2013  3:56PM     RESULT:     Findings: Chest tube is identified within the right lung apex and right   lung base. There is no evidence of an appreciable pneumothorax. No focal   regions of consolidation or focal infiltrates appreciated. The cardiac   silhouette and visualized bony skeleton is unremarkable. There is volume   loss within the right hemithorax. Skin staples are identified on the   right.     IMPRESSION:  Post surgical changes in the right hemithorax without     evidence of acute cardiopulmonary abnormalities. There does not appear to   be an appreciable  pneumothorax.      Thank you for this opportunity to contribute to the care of your patient.         Dictated By: Mikki Santee, M.D., MD    Impression S/p Rt thoracotomy and Rt lower lobe resection for Lung mass. Pt is doing well post-op Coninue pain management, O2  Chest tube management as per Dr. Faith Rogue HTN: Hold Lisinopril at this time -Can restart in am -BP permitting  CKD: Continue to monitor renal function CAD- s/p CABG -Can resume Plavix,Aspirin and statins in am   Electronic Signatures: Tracie Harrier (MD)  (Signed 26-Mar-14 19:15)  Authored: General Aspect/Present Illness, Allergies, History and Physical Exam, Vital Signs, Radiology, Impression/Plan   Last Updated: 26-Mar-14 19:15 by Tracie Harrier (MD)

## 2015-04-04 ENCOUNTER — Ambulatory Visit
Admission: RE | Admit: 2015-04-04 | Discharge: 2015-04-04 | Disposition: A | Discharge status: Home / Self Care | Payer: PPO | Source: Ambulatory Visit | Attending: Oncology | Admitting: Oncology

## 2015-04-04 ENCOUNTER — Other Ambulatory Visit: Payer: Self-pay | Admitting: *Deleted

## 2015-04-04 ENCOUNTER — Ambulatory Visit
Admission: RE | Admit: 2015-04-04 | Discharge: 2015-04-04 | Disposition: A | Discharge status: Home / Self Care | Payer: PPO | Source: Ambulatory Visit | Attending: Family Medicine | Admitting: Family Medicine

## 2015-04-04 DIAGNOSIS — J439 Emphysema, unspecified: Secondary | ICD-10-CM | POA: Diagnosis not present

## 2015-04-04 DIAGNOSIS — J984 Other disorders of lung: Secondary | ICD-10-CM | POA: Diagnosis not present

## 2015-04-04 DIAGNOSIS — C3490 Malignant neoplasm of unspecified part of unspecified bronchus or lung: Secondary | ICD-10-CM

## 2015-04-04 DIAGNOSIS — Z08 Encounter for follow-up examination after completed treatment for malignant neoplasm: Secondary | ICD-10-CM | POA: Diagnosis present

## 2015-04-09 ENCOUNTER — Inpatient Hospital Stay (HOSPITAL_BASED_OUTPATIENT_CLINIC_OR_DEPARTMENT_OTHER): Payer: PPO | Admitting: Oncology

## 2015-04-09 ENCOUNTER — Inpatient Hospital Stay: Payer: PPO | Attending: Oncology

## 2015-04-09 ENCOUNTER — Encounter: Payer: Self-pay | Admitting: Oncology

## 2015-04-09 VITALS — BP 146/84 | HR 80 | Temp 95.2°F | Wt 202.6 lb

## 2015-04-09 DIAGNOSIS — C3431 Malignant neoplasm of lower lobe, right bronchus or lung: Secondary | ICD-10-CM

## 2015-04-09 DIAGNOSIS — Z79899 Other long term (current) drug therapy: Secondary | ICD-10-CM | POA: Insufficient documentation

## 2015-04-09 DIAGNOSIS — Z8546 Personal history of malignant neoplasm of prostate: Secondary | ICD-10-CM | POA: Diagnosis not present

## 2015-04-09 DIAGNOSIS — C3490 Malignant neoplasm of unspecified part of unspecified bronchus or lung: Secondary | ICD-10-CM

## 2015-04-09 LAB — COMPREHENSIVE METABOLIC PANEL
ALT: 19 U/L (ref 17–63)
AST: 19 U/L (ref 15–41)
Albumin: 3.9 g/dL (ref 3.5–5.0)
Alkaline Phosphatase: 53 U/L (ref 38–126)
Anion gap: 4 — ABNORMAL LOW (ref 5–15)
BUN: 25 mg/dL — AB (ref 6–20)
CO2: 28 mmol/L (ref 22–32)
CREATININE: 1.64 mg/dL — AB (ref 0.61–1.24)
Calcium: 9 mg/dL (ref 8.9–10.3)
Chloride: 104 mmol/L (ref 101–111)
GFR calc Af Amer: 46 mL/min — ABNORMAL LOW (ref >60)
GFR calc non Af Amer: 40 mL/min — ABNORMAL LOW (ref >60)
GLUCOSE: 121 mg/dL — AB (ref 65–99)
Potassium: 4.1 mmol/L (ref 3.5–5.1)
SODIUM: 136 mmol/L (ref 135–145)
TOTAL PROTEIN: 6.5 g/dL (ref 6.5–8.1)
Total Bilirubin: 0.7 mg/dL (ref 0.3–1.2)

## 2015-04-09 LAB — CBC WITH DIFFERENTIAL/PLATELET
BASOS ABS: 0 10*3/uL (ref 0–0.1)
Basophils Relative: 1 %
EOS ABS: 0.1 10*3/uL (ref 0–0.7)
Eosinophils Relative: 1 %
HCT: 41.3 % (ref 40.0–52.0)
Hemoglobin: 13.6 g/dL (ref 13.0–18.0)
LYMPHS ABS: 1.2 10*3/uL (ref 1.0–3.6)
LYMPHS PCT: 19 %
MCH: 30.8 pg (ref 26.0–34.0)
MCHC: 33 g/dL (ref 32.0–36.0)
MCV: 93.2 fL (ref 80.0–100.0)
MONOS PCT: 8 %
Monocytes Absolute: 0.5 10*3/uL (ref 0.2–1.0)
NEUTROS ABS: 4.5 10*3/uL (ref 1.4–6.5)
NEUTROS PCT: 71 %
Platelets: 136 10*3/uL — ABNORMAL LOW (ref 150–440)
RBC: 4.43 MIL/uL (ref 4.40–5.90)
RDW: 14 % (ref 11.5–14.5)
WBC: 6.2 10*3/uL (ref 3.8–10.6)

## 2015-04-09 NOTE — Progress Notes (Signed)
Patient former smoker.  Does have living will.

## 2015-04-12 ENCOUNTER — Encounter: Payer: Self-pay | Admitting: Oncology

## 2015-04-12 DIAGNOSIS — I10 Essential (primary) hypertension: Secondary | ICD-10-CM | POA: Insufficient documentation

## 2015-04-12 DIAGNOSIS — N261 Atrophy of kidney (terminal): Secondary | ICD-10-CM | POA: Insufficient documentation

## 2015-04-12 DIAGNOSIS — C3490 Malignant neoplasm of unspecified part of unspecified bronchus or lung: Secondary | ICD-10-CM | POA: Insufficient documentation

## 2015-04-12 DIAGNOSIS — D649 Anemia, unspecified: Secondary | ICD-10-CM | POA: Insufficient documentation

## 2015-04-12 DIAGNOSIS — C61 Malignant neoplasm of prostate: Secondary | ICD-10-CM | POA: Insufficient documentation

## 2015-04-12 DIAGNOSIS — K219 Gastro-esophageal reflux disease without esophagitis: Secondary | ICD-10-CM | POA: Insufficient documentation

## 2015-04-12 NOTE — Progress Notes (Signed)
[place] @ [facility] [phone]  Fax:([phone]) [place]: [birth year]  MR#: [id number]  UMP#:[id number]  Patient Care Team: Idelle Crouch, MD as PCP - General (Internal Medicine)  CHIEF COMPLAINT:  Chief Complaint  Patient presents with  . 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  All the margins are clear No blood vessel invasion     Cancer of lung   04/12/2015 Initial Diagnosis Cancer of lung    No flowsheet data found.  INTERVAL HISTORY: 74 year old gentleman in the history of carcinoma of right lower lobe lung status post resection came today for a follow-up.  Since last evaluation patient had developed right-sided stroke from which he is recovering. No cough or shortness of breath appetite remains stable  REVIEW OF SYSTEMS:   GENERAL:  Feels good.  Active.  No fevers, sweats or weight loss. PERFORMANCE STATUS (ECOG):  01 HEENT:  No visual changes, runny nose, sore throat, mouth sores or tenderness. Lungs: No shortness of breath or cough.  No hemoptysis. Cardiac:  No chest pain, palpitations, orthopnea, or PND. GI:  No nausea, vomiting, diarrhea, constipation, melena or hematochezia. GU:  No urgency, frequency, dysuria, or hematuria. Musculoskeletal:  No back pain.  No joint pain.  No muscle tenderness. Extremities:  No pain or swelling. Skin:  No rashes or skin changes. Neuro:  Patient developed stroke and was admitted in hospital from which patient is recovering Endocrine:  No diabetes, thyroid issues, hot flashes or night sweats. Psych:  No mood changes, depression or anxiety. Pain:  No focal pain. Review of systems:  All other systems reviewed and found to be negative. As per HPI. Otherwise, a complete review of systems is negatve.  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  All the margins are clear No blood vessel invasion ADVANCED DIRECTIVES:  Patient does have advanced health care directive  HEALTH MAINTENANCE: History  Substance Use Topics  . Smoking status: Former Research scientist (life sciences)  . Smokeless tobacco: Not on file  . Alcohol Use: Not on file      No Known Allergies  Current Outpatient Prescriptions  Medication Sig Dispense Refill  . atorvastatin (LIPITOR) 40 MG tablet Take by mouth.    . carisoprodol (SOMA) 350 MG tablet Take by mouth.    . clopidogrel (PLAVIX) 75 MG tablet Take by mouth.    Marland Kitchen lisinopril (PRINIVIL,ZESTRIL) 5 MG tablet Take by mouth.    . niacin (NIASPAN) 500 MG CR tablet Take by mouth.    Marland Kitchen omeprazole (PRILOSEC) 20 MG capsule Take by mouth.    . traMADol-acetaminophen (ULTRACET) 37.5-325 MG per tablet TAKE ONE TABLET EVERY 4 HOURS AS NEEDED FOR PAIN    . traZODone (DESYREL) 100 MG tablet Take by mouth.    . zolpidem (AMBIEN) 10 MG tablet Take by mouth.    Marland Kitchen aspirin EC 81 MG tablet Take by mouth.    . calcium citrate-vitamin D (CITRACAL+D) 315-200 MG-UNIT per tablet Take by mouth.    . clotrimazole-betamethasone (LOTRISONE) lotion Apply topically.    . Cyanocobalamin 1000 MCG TBCR Take by mouth.    . ferrous sulfate (SLOW FE) 160 (50 FE) MG TBCR SR tablet Take by mouth.    . Multiple Vitamins-Minerals (CENTRUM SILVER ULTRA MENS) TABS Take by mouth.    Marland Kitchen  Multiple Vitamins-Minerals (PRESERVISION/LUTEIN) CAPS Take by mouth.    . Omega-3 Fatty Acids (FISH OIL) 1200 MG CAPS Take by mouth.    . polyethylene glycol (MIRALAX / GLYCOLAX) packet Take by mouth.     No current facility-administered medications for this visit.  1 Significant History/PMH:   Right Shoulder Hemiarthroplasty: 28-Jul-2011   PVD - Peripheral Vascular Disease:    Hypertension:    tension headaches:    meralgia parensthetica:    hyperlipidemia:    degenerative disk disease with lumbar radiculopathy:    atrophic kidney:     atherosclerotic cardivascular disease:    arthritis:    anxiety disorder:    Cardiac Catherization: Jun 2006   MI - Myocardial Infarct: Jun 2006   Radical Prostatectomy: Dec 2011   knee surgery left knee: 2000   bilateral knee replacements: 09-Feb-2007   CABG: Jun 2006  Preventive Screening:  Has patient had any of the following test? Colonscopy  Prostate Exam   Last Colonoscopy: 2002   Last Prostate Exam: 2013   Smoking History: Smoking History quit in 2012; smoked occasional pipe; started smoking age 17-smoked 3/4 pack. day until 2012.  PFSH: Comments: Sister  had breast cancer..  Another sister had thyroid cancer.  Social History: negative alcohol, negative tobacco   OBJECTIVE:  Filed Vitals:   04/09/15 1157  BP: 146/84  Pulse: 80  Temp: 95.2 F (35.1 C)     There is no height on file to calculate BMI.    ECOG FS:1 - Symptomatic but completely ambulatory  PHYSICAL EXAM:General  status: Performance status is good.  Patient has not lost significant weight HEENT: No evidence of stomatitis. Sclera and conjunctivae :: No jaundice.   pale looking. Lungs: Air  entry equal on both sides.  No rhonchi.  No rales.  Cardiac: Heart sounds are normal.  No pericardial rub.  No murmur. Lymphatic system: Cervical, axillary, inguinal, lymph nodes not palpable GI: Abdomen is soft.  No ascites.  Liver spleen not palpable.  No tenderness.  Bowel sounds are within normal limit Lower extremity: No edema Neurological system: Higher functions, cranial nerves intact no evidence of peripheral neuropathy. Skin: No rash.  No ecchymosis.Marland Kitchen   LAB RESULTS:  Appointment on 04/09/2015  Component Date Value Ref Range Status  . WBC 04/09/2015 6.2  3.8 - 10.6 K/uL Final  . RBC 04/09/2015 4.43  4.40 - 5.90 MIL/uL Final  . Hemoglobin 04/09/2015 13.6  13.0 - 18.0 g/dL Final  . HCT 04/09/2015 41.3  40.0 - 52.0 % Final  . MCV 04/09/2015 93.2  80.0 - 100.0 fL Final  . MCH 04/09/2015 30.8   26.0 - 34.0 pg Final  . MCHC 04/09/2015 33.0  32.0 - 36.0 g/dL Final  . RDW 04/09/2015 14.0  11.5 - 14.5 % Final  . Platelets 04/09/2015 136* 150 - 440 K/uL Final  . Neutrophils Relative % 04/09/2015 71   Final  . Neutro Abs 04/09/2015 4.5  1.4 - 6.5 K/uL Final  . Lymphocytes Relative 04/09/2015 19   Final  . Lymphs Abs 04/09/2015 1.2  1.0 - 3.6 K/uL Final  . Monocytes Relative 04/09/2015 8   Final  . Monocytes Absolute 04/09/2015 0.5  0.2 - 1.0 K/uL Final  . Eosinophils Relative 04/09/2015 1   Final  . Eosinophils Absolute 04/09/2015 0.1  0 - 0.7 K/uL Final  . Basophils Relative 04/09/2015 1   Final  . Basophils Absolute 04/09/2015 0.0  0 - 0.1 K/uL Final  . Sodium 04/09/2015 136  135 - 145 mmol/L Final  . Potassium 04/09/2015 4.1  3.5 - 5.1 mmol/L Final  . Chloride 04/09/2015 104  101 - 111 mmol/L Final  . CO2 04/09/2015 28  22 - 32 mmol/L Final  . Glucose, Bld 04/09/2015 121* 65 - 99 mg/dL Final  . BUN 04/09/2015 25* 6 - 20 mg/dL Final  . Creatinine, Ser 04/09/2015 1.64* 0.61 - 1.24 mg/dL Final  . Calcium 04/09/2015 9.0  8.9 - 10.3 mg/dL Final  . Total Protein 04/09/2015 6.5  6.5 - 8.1 g/dL Final  . Albumin 04/09/2015 3.9  3.5 - 5.0 g/dL Final  . AST 04/09/2015 19  15 - 41 U/L Final  . ALT 04/09/2015 19  17 - 63 U/L Final  . Alkaline Phosphatase 04/09/2015 53  38 - 126 U/L Final  . Total Bilirubin 04/09/2015 0.7  0.3 - 1.2 mg/dL Final  . GFR calc non Af Amer 04/09/2015 40* >60 mL/min Final  . GFR calc Af Amer 04/09/2015 46* >60 mL/min Final   Comment: (NOTE) The eGFR has been calculated using the CKD EPI equation. This calculation has not been validated in all clinical situations. eGFR's persistently <60 mL/min signify possible Chronic Kidney Disease.   . Anion gap 04/09/2015 4* 5 - 15 Final    No results found for: PSA  STUDIES: Dg Chest 2 View  04/04/2015   CLINICAL DATA:  History of lung carcinoma  EXAM: CHEST  2 VIEW  COMPARISON:  Chest radiograph Mar 26, 2014;  PET-CT October 24, 2014  FINDINGS: There is underlying emphysematous change. There is scarring in the right base. There is no edema or consolidation. Heart size and pulmonary vascularity within normal limits. Pacemaker leads are attached to the right atrium and right ventricle. Patient is status post coronary artery bypass grafting. No adenopathy. There is a total shoulder replacement on the right. No blastic or lytic bone lesions are identified.  IMPRESSION: Scarring right base. Underlying emphysema. No edema or consolidation. No mass or adenopathy appreciable.   Electronically Signed   By: Lowella Grip III M.D.   On: 04/04/2015 10:22    ASSESSMENT: Carcinoma of lungs right lower lobe stage I status post resection and there is no evidence of recurrent or progressive disease on clinical examination  MEDICAL DECISION MAKING:  Lab data has been reviewed. Recent episode of stroke with mild neurological deficit Cancer of  prostate being followed by urologist Patient expressed understanding and was in agreement with this plan. He also understands that He can call clinic at any time with any questions, concerns, or complaints.    Cancer of lung   Staging form: Lung, AJCC 7th Edition     Clinical: T1, N0, M0 - Signed by Forest Gleason, MD on 04/12/2015   Forest Gleason, MD   04/12/2015 4:22 PM

## 2015-04-17 ENCOUNTER — Ambulatory Visit (INDEPENDENT_AMBULATORY_CARE_PROVIDER_SITE_OTHER): Payer: PPO | Admitting: Neurology

## 2015-04-17 ENCOUNTER — Encounter: Payer: Self-pay | Admitting: Neurology

## 2015-04-17 VITALS — BP 100/65 | HR 74 | Ht 71.0 in | Wt 204.8 lb

## 2015-04-17 DIAGNOSIS — I6782 Cerebral ischemia: Secondary | ICD-10-CM

## 2015-04-17 DIAGNOSIS — G939 Disorder of brain, unspecified: Secondary | ICD-10-CM

## 2015-04-17 NOTE — Patient Instructions (Signed)
Stroke Prevention Some medical conditions and behaviors are associated with an increased chance of having a stroke. You may prevent a stroke by making healthy choices and managing medical conditions. HOW CAN I REDUCE MY RISK OF HAVING A STROKE?   Stay physically active. Get at least 30 minutes of activity on most or all days.  Do not smoke. It may also be helpful to avoid exposure to secondhand smoke.  Limit alcohol use. Moderate alcohol use is considered to be:  No more than 2 drinks per day for men.  No more than 1 drink per day for nonpregnant women.  Eat healthy foods. This involves:  Eating 5 or more servings of fruits and vegetables a day.  Making dietary changes that address high blood pressure (hypertension), high cholesterol, diabetes, or obesity.  Manage your cholesterol levels.  Making food choices that are high in fiber and low in saturated fat, trans fat, and cholesterol may control cholesterol levels.  Take any prescribed medicines to control cholesterol as directed by your health care provider.  Manage your diabetes.  Controlling your carbohydrate and sugar intake is recommended to manage diabetes.  Take any prescribed medicines to control diabetes as directed by your health care provider.  Control your hypertension.  Making food choices that are low in salt (sodium), saturated fat, trans fat, and cholesterol is recommended to manage hypertension.  Take any prescribed medicines to control hypertension as directed by your health care provider.  Maintain a healthy weight.  Reducing calorie intake and making food choices that are low in sodium, saturated fat, trans fat, and cholesterol are recommended to manage weight.  Stop drug abuse.  Avoid taking birth control pills.  Talk to your health care provider about the risks of taking birth control pills if you are over 35 years old, smoke, get migraines, or have ever had a blood clot.  Get evaluated for sleep  disorders (sleep apnea).  Talk to your health care provider about getting a sleep evaluation if you snore a lot or have excessive sleepiness.  Take medicines only as directed by your health care provider.  For some people, aspirin or blood thinners (anticoagulants) are helpful in reducing the risk of forming abnormal blood clots that can lead to stroke. If you have the irregular heart rhythm of atrial fibrillation, you should be on a blood thinner unless there is a good reason you cannot take them.  Understand all your medicine instructions.  Make sure that other conditions (such as anemia or atherosclerosis) are addressed. SEEK IMMEDIATE MEDICAL CARE IF:   You have sudden weakness or numbness of the face, arm, or leg, especially on one side of the body.  Your face or eyelid droops to one side.  You have sudden confusion.  You have trouble speaking (aphasia) or understanding.  You have sudden trouble seeing in one or both eyes.  You have sudden trouble walking.  You have dizziness.  You have a loss of balance or coordination.  You have a sudden, severe headache with no known cause.  You have new chest pain or an irregular heartbeat. Any of these symptoms may represent a serious problem that is an emergency. Do not wait to see if the symptoms will go away. Get medical help at once. Call your local emergency services (911 in [place] Do not drive yourself to the hospital. Document Released: 12/02/2004 Document Revised: 03/11/2014 Document Reviewed: 04/27/2013 ExitCare Patient Information 2015 [employer]. This information is not intended to replace advice given   to you by your health care provider. Make sure you discuss any questions you have with your health care provider.  

## 2015-04-17 NOTE — Progress Notes (Signed)
Reason for visit: Confusion  Referring physician: Dr. Lorenda Hatchet Rick Mcbride. is a 74 y.o. male  History of present illness:  Rick Mcbride is a 74 year old right-handed white male with a history of onset of confusion that occurred on 10/25/2014. The patient was at the hospital visiting his son. The patient had sudden onset of confusion around 9 AM associated with some reports of dizziness. The patient went directly down to the emergency room, and blood work was done along with a CT scan and an EKG. He was unable to have a MRI study secondary to a cardiac pacemaker. The patient was not admitted to the hospital. He underwent a stroke workup that included a 2-D echocardiogram and a carotid Doppler study, and he was placed on aspirin therapy. I do not have the results of the actual workup, but the family indicates that the studies were unremarkable. The CT scan did not show evidence of a stroke. He had a CT scan as an outpatient as well that once again did not define a stroke episode. He was seen by a neurologist in the [place], [place] area. He was told that he likely had a small stroke event that was not apparent on the CT. Since December 2015, the patient has gradually improved with the memory, but he is not quite back to his usual cognitive baseline. The patient was seen by speech therapy for cognitive evaluation as well. The patient is back to driving this point, he was unable to do taxes this year. He still has some mild short-term memory issues. 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 He denies any visual changes. During the episode of confusion, he had some hoarseness of the voice, no definite aphasia or dysarthria. He denies any visual changes or visual loss. He is sent to this office for an evaluation. He does have evidence of a vitamin B12 deficiency, his most recent level of 282 was noted in April  2016.  Past Medical History  Diagnosis Date  . Heart disease   . GERD (gastroesophageal reflux disease)   . Hyperlipidemia   . Sleep apnea   . Hypertension   . Neuralgia   . Hypercholesteremia   . Sinoatrial node dysfunction   . DDD (degenerative disc disease), lumbar   . Headache   . Meralgia paresthetica   . Atrophic kidney   . Osteoarthritis   . Anxiety   . Anemia   . Iliac aneurysm   . Iliac aneurysm   . Second degree AV block   . Prostate cancer   . Lung cancer   . AAA (abdominal aortic aneurysm)   . Lung cancer   . TIA (transient ischemic attack)   . Cervical radiculopathy   . Stroke     Past Surgical History  Procedure Laterality Date  . Coronary atherosclerosis of autologous vein bypass graft    . Coronary artery bypass graft    . Knee arthroscopy    . Prostatectomy    . Total shoulder replacement    . Colonoscopy    . Polypectomy    . Pacemaker insertion    . Lobectomy    . Total knee arthroplasty Bilateral   . Cataract extraction      Family History  Problem Relation Age of Onset  . Heart attack Mother   . Heart attack Father   . Breast cancer Sister   .  Asthma Sister     Social history:  reports that he quit smoking about 11 years ago. He does not have any smokeless tobacco history on file. He reports that he does not drink alcohol or use illicit drugs.  Medications:  Prior to Admission medications   Medication Sig Start Date End Date Taking? Authorizing Provider  aspirin EC 81 MG tablet Take by mouth.   Yes Historical Provider, MD  atorvastatin (LIPITOR) 40 MG tablet Take by mouth. 10/30/14  Yes Historical Provider, MD  calcium citrate-vitamin D (CITRACAL+D) 315-200 MG-UNIT per tablet Take by mouth.   Yes Historical Provider, MD  carisoprodol (SOMA) 350 MG tablet Take by mouth. 10/30/14  Yes Historical Provider, MD  cetirizine (ZYRTEC) 10 MG tablet Take 10 mg by mouth daily.   Yes Historical Provider, MD  clopidogrel (PLAVIX) 75 MG tablet  Take by mouth. 10/30/14  Yes Historical Provider, MD  clotrimazole-betamethasone (LOTRISONE) lotion Apply topically.   Yes Historical Provider, MD  Cyanocobalamin 1000 MCG TBCR Take by mouth.   Yes Historical Provider, MD  ferrous sulfate (SLOW FE) 160 (50 FE) MG TBCR SR tablet Take by mouth.   Yes Historical Provider, MD  lisinopril (PRINIVIL,ZESTRIL) 5 MG tablet Take by mouth. 10/30/14  Yes Historical Provider, MD  Multiple Vitamins-Minerals (CENTRUM SILVER ULTRA MENS) TABS Take by mouth.   Yes Historical Provider, MD  Multiple Vitamins-Minerals (PRESERVISION/LUTEIN) CAPS Take by mouth.   Yes Historical Provider, MD  niacin (NIASPAN) 500 MG CR tablet Take by mouth. 10/30/14  Yes Historical Provider, MD  Omega-3 Fatty Acids (FISH OIL) 1200 MG CAPS Take by mouth.   Yes Historical Provider, MD  omeprazole (PRILOSEC) 20 MG capsule Take by mouth. 10/30/14  Yes Historical Provider, MD  polyethylene glycol (MIRALAX / GLYCOLAX) packet Take by mouth.   Yes Historical Provider, MD  traMADol-acetaminophen (ULTRACET) 37.5-325 MG per tablet TAKE ONE TABLET EVERY 4 HOURS AS NEEDED FOR PAIN 01/12/15  Yes Historical Provider, MD  traZODone (DESYREL) 100 MG tablet Take by mouth. 10/30/14  Yes Historical Provider, MD  zolpidem (AMBIEN) 10 MG tablet Take by mouth. 10/30/14  Yes Historical Provider, MD     No Known Allergies  ROS:  Out of a complete 14 system review of symptoms, the patient complains only of the following symptoms, and all other reviewed systems are negative.  Fatigue Snoring Easy bruising, easy bleeding Memory loss, confusion, numbness, weakness  Blood pressure 100/65, pulse 74, height '5\' 11"'$  (1.803 m), weight 204 lb 12.8 oz (92.897 kg).  Physical Exam  General: The patient is alert and cooperative at the time of the examination. The patient is moderately obese.  Eyes: Pupils are equal, round, and reactive to light. Discs are flat bilaterally.  Neck: The neck is supple, no carotid  bruits are noted.  Respiratory: The respiratory examination is clear.  Cardiovascular: The cardiovascular examination reveals a regular rate and rhythm, no obvious murmurs or rubs are noted.  Skin: Extremities are without significant edema.  Neurologic Exam  Mental status: The patient is alert and oriented x 3 at the time of the examination. The patient has apparent normal recent and remote memory, with an apparently normal attention span and concentration ability. Mini-Mental Status Examination done today shows a total score 27/30. The patient is able to name 8 animals in 30 seconds.  Cranial nerves: Facial symmetry is present. There is good sensation of the face to pinprick and soft touch bilaterally. The strength of the facial muscles and the muscles to head  turning and shoulder shrug are normal bilaterally. Speech is well enunciated, no aphasia or dysarthria is noted. Extraocular movements are full. Visual fields are full. The tongue is midline, and the patient has symmetric elevation of the soft palate. No obvious hearing deficits are noted.  Motor: The motor testing reveals 5 over 5 strength of all 4 extremities. Good symmetric motor tone is noted throughout.  Sensory: Sensory testing is intact to pinprick, soft touch, vibration sensation, and position sense on all 4 extremities, with except that there is a stocking pattern pinprick sensory deficit one half way up the legs below the knees bilaterally. No evidence of extinction is noted.  Coordination: Cerebellar testing reveals good finger-nose-finger and heel-to-shin bilaterally.  Gait and station: Gait is normal. Tandem gait is slightly unsteady. Romberg is negative. No drift is seen.  Reflexes: Deep tendon reflexes are symmetric and normal bilaterally. Toes are downgoing bilaterally.   Assessment/Plan:  1. Acute confusional episode  2. Vitamin B12 deficiency  The patient had a sudden onset event of confusion with gradual  improvement of memory over weeks or months following the event in December 2015. The patient did likely sustain a small stroke event that was never seen by CT scan evaluation. Small strokes affecting the medial dorsal thalamic nucleus may result in sudden memory changes. He will remain on aspirin at this point. I will try to obtain the results of the 2-D echocardiogram, carotid Doppler study, and CT brain scan evaluations done at [facility]. The patient will follow-up through this office if needed.  Jill Alexanders MD 04/17/2015 7:31 PM  [facility] [address] [place], [place] [address]  Phone [phone] Fax [phone]

## 2015-04-21 ENCOUNTER — Telehealth: Payer: Self-pay | Admitting: Neurology

## 2015-04-21 NOTE — Telephone Encounter (Signed)
I received a report of the CT of the brain done. This shows stable atrophy and chronic white matter disease, no acute changes were seen. The CT was done on 18th of December 2015. No report of the 2-D echocardiogram or carotid Doppler study was available. The study was done at [facility].

## 2015-05-27 ENCOUNTER — Encounter: Payer: Self-pay | Admitting: Physical Therapy

## 2015-05-27 ENCOUNTER — Ambulatory Visit: Payer: PPO | Attending: Internal Medicine | Admitting: Physical Therapy

## 2015-05-27 DIAGNOSIS — M6281 Muscle weakness (generalized): Secondary | ICD-10-CM | POA: Insufficient documentation

## 2015-05-27 DIAGNOSIS — M25512 Pain in left shoulder: Secondary | ICD-10-CM | POA: Diagnosis present

## 2015-05-28 NOTE — Therapy (Signed)
[facility] [address] [place], [address] Phone: [phone]   Fax:  [phone]  Physical Therapy Evaluation  Patient Details  Name: Rick Mcbride. MRN: [id number] Date of Birth: [birth year] Referring Provider:  Idelle Crouch, MD  Encounter Date: 05/27/2015      PT End of Session - 05/28/15 1100    Visit Number 1   Number of Visits 12   Date for PT Re-Evaluation 07/09/15   Authorization Type 1   Authorization Time Period 10   PT Start Time 1010   PT Stop Time 1100   PT Time Calculation (min) 50 min   Activity Tolerance Patient tolerated treatment well   Behavior During Therapy [facility] for tasks assessed/performed      Past Medical History  Diagnosis Date  . Heart disease   . GERD (gastroesophageal reflux disease)   . Hyperlipidemia   . Sleep apnea   . Hypertension   . Neuralgia   . Hypercholesteremia   . Sinoatrial node dysfunction   . DDD (degenerative disc disease), lumbar   . Headache   . Meralgia paresthetica   . Atrophic kidney   . Osteoarthritis   . Anxiety   . Anemia   . Iliac aneurysm   . Iliac aneurysm   . Second degree AV block   . Prostate cancer   . Lung cancer   . AAA (abdominal aortic aneurysm)   . Lung cancer   . TIA (transient ischemic attack)   . Cervical radiculopathy   . Stroke     Past Surgical History  Procedure Laterality Date  . Coronary atherosclerosis of autologous vein bypass graft    . Coronary artery bypass graft    . Knee arthroscopy    . Prostatectomy    . Total shoulder replacement    . Colonoscopy    . Polypectomy    . Pacemaker insertion    . Lobectomy    . Total knee arthroplasty Bilateral   . Cataract extraction      There were no vitals filed for this visit.  Visit Diagnosis:  Pain in left shoulder - Plan: PT plan of care cert/re-cert  Muscle weakness - Plan: PT plan of care cert/re-cert      Subjective Assessment -  05/27/15 1020    Subjective Patient reports he is having pain in left shoulder that is affecting his ability to sleep on left side and he cannot use his left arm like he would like to.    Pertinent History Patient reports history of left shoulder pain for ~ 2 years and has had to wait on treatment due to personal issues that prevented him being able to have treatemnt. He is anticipating  TSA in his future.    Limitations Lifting;House hold activities   Diagnostic tests X rays: "bone on bone" left shoulder per patient report    Patient Stated Goals Patient would like to decrease pain in left sholder and be able to use left arm with less difficulty and preparing for future TSA   Currently in Pain? Yes   Pain Score 0-No pain  increased pain with movement with elevation can go up to 10/10    Pain Location Shoulder   Pain Orientation Left   Pain Descriptors / Indicators Aching  sharp with movement   Pain Type Chronic pain   Pain Onset More than a month ago   Pain Frequency Constant  constant aching with intermittnet  pain sharp stabbing with movement    Aggravating Factors  movement, reaching, lifting   Pain Relieving Factors rest, medication   Multiple Pain Sites No            [facility] PT Assessment - 05/27/15 1033    Assessment   Medical Diagnosis M25.512 acute pain of left shoulder   Onset Date/Surgical Date 11/08/14   Hand Dominance Right   Next MD Visit unkown   Prior Therapy none   Precautions   Precautions None   Restrictions   Weight Bearing Restrictions No   Balance Screen   Has the patient fallen in the past 6 months No   Has the patient had a decrease in activity level because of a fear of falling?  No   Is the patient reluctant to leave their home because of a fear of falling?  No   Home [employer] residence   Living Arrangements Spouse/significant other   Home Access Level entry   Home Layout One level   Prior Function   Level of  [place]   Overall Cognitive Status Within Functional Limits for tasks assessed   Attention Focused   Memory Appears intact      Objective: AROM: left shoulder WFL's all planes of motion with pain with all movement Strength: grossly shoulder flexion, abduction, extension and rotations strong without pain at neutral positions, decreased motor control of periscapular muscles QuickDash: 40% self perceived disability      PT Education - 05/27/15 1100    Education provided Yes   Education Details Patient instructed in isometric exercises for left shoulder and lower trapezius control exercise with demonstration, verbal cuing and handout given   Person(s) Educated Patient   Methods Explanation;Demonstration;Verbal cues;Handout   Comprehension Verbalized understanding;Returned demonstration;Verbal cues required             PT Long Term Goals - 05/27/15 1100    PT LONG TERM GOAL #1   Title Patient will demonstrate improvement in functional use of left shoulder/UE with improved QuickDash score of 30% or better by 07/09/2015   Baseline current quick Dash score 40% impairment   Status New   PT LONG TERM GOAL #2   Title Patient will demonstrate independent home exericses and improved control of scapular musculature without cuing by 07/09/2015   Baseline limited to no knowledge of appropriate exercises to perform to improve strength/scontrol left shoulder   Status New               Plan - 05/27/15 1100    Clinical Impression Statement Patient is a 76 right hand dominant male who presents with chronic left shoulder pain that is limiting his daily personal care, household and community tasks. He is limited by pain primarily and demonstrates weakness in left shoulder stabilitzers and shoulder musculature. He has no knowledge of appropriate pain control strategies and progression of exercises to improve strength and functional use of left shoulder as he  prepares for future total shoulder replacement.    Pt will benefit from skilled therapeutic intervention in order to improve on the following deficits Decreased strength;Pain;Impaired UE functional use   Rehab Potential Fair   Clinical Impairments Affecting Rehab Potential (-) chronic condition, pain  (+) motivated   PT Frequency 2x / week   PT Duration 6 weeks   PT Treatment/Interventions Patient/family education;Moist Heat;Electrical Stimulation;Therapeutic exercise;Cryotherapy;Manual techniques   PT Next Visit Plan pain control, progressive strengthening and motor control exercises left  UE/shoulder   PT Home Exercise Plan scapular control exercise, isometric shoulder exercises at door   Consulted and Agree with Plan of Care Patient          G-Codes - 06/14/2015 1100    Functional Assessment Tool Used QuickDash, pain scale, clinical judgment   Functional Limitation Carrying, moving and handling objects   Carrying, Moving and Handling Objects Current Status (P1980) At least 40 percent but less than 60 percent impaired, limited or restricted   Carrying, Moving and Handling Objects Goal Status (I2179) At least 20 percent but less than 40 percent impaired, limited or restricted       Problem List Patient Active Problem List   Diagnosis Date Noted  . Absolute anemia 04/12/2015  . Atrophic kidney 04/12/2015  . Essential (primary) hypertension 04/12/2015  . Acid reflux 04/12/2015  . Cancer of lung 04/12/2015  . CA of prostate 04/12/2015  . H/O adenomatous polyp of colon 01/15/2015  . Temporary cerebral vascular dysfunction 11/21/2014  . Calcific shoulder tendinitis 08/01/2014  . Cervical nerve root disorder 04/07/2012    [name] PT 05/28/2015, 4:50 PM  [facility] [address], [place], [address] Phone: [phone]   Fax:  [phone]

## 2015-06-03 DIAGNOSIS — I10 Essential (primary) hypertension: Secondary | ICD-10-CM | POA: Insufficient documentation

## 2015-06-05 ENCOUNTER — Encounter: Payer: Self-pay | Admitting: Physical Therapy

## 2015-06-05 ENCOUNTER — Ambulatory Visit: Payer: PPO | Admitting: Physical Therapy

## 2015-06-05 DIAGNOSIS — M25512 Pain in left shoulder: Secondary | ICD-10-CM

## 2015-06-05 DIAGNOSIS — M6281 Muscle weakness (generalized): Secondary | ICD-10-CM

## 2015-06-06 NOTE — Therapy (Signed)
[facility] [address] [place], [address] Phone: [phone]   Fax:  [phone]  Physical Therapy Treatment  Patient Details  Name: Rick Mcbride. MRN: [id number] Date of Birth: Nov 04, 1941 Referring Provider:  Idelle Crouch, MD  Encounter Date: 06/05/2015      PT End of Session - 06/05/15 1715    Visit Number 2   Number of Visits 12   Date for PT Re-Evaluation 07/09/15   Authorization Type 2   Authorization Time Period 10   PT Start Time 1632   PT Stop Time 4010   PT Time Calculation (min) 43 min   Activity Tolerance Patient tolerated treatment well;Patient limited by pain   Behavior During Therapy [facility] for tasks assessed/performed      Past Medical History  Diagnosis Date  . Heart disease   . GERD (gastroesophageal reflux disease)   . Hyperlipidemia   . Sleep apnea   . Hypertension   . Neuralgia   . Hypercholesteremia   . Sinoatrial node dysfunction   . DDD (degenerative disc disease), lumbar   . Headache   . Meralgia paresthetica   . Atrophic kidney   . Osteoarthritis   . Anxiety   . Anemia   . Iliac aneurysm   . Iliac aneurysm   . Second degree AV block   . Prostate cancer   . Lung cancer   . AAA (abdominal aortic aneurysm)   . Lung cancer   . TIA (transient ischemic attack)   . Cervical radiculopathy   . Stroke     Past Surgical History  Procedure Laterality Date  . Coronary atherosclerosis of autologous vein bypass graft    . Coronary artery bypass graft    . Knee arthroscopy    . Prostatectomy    . Total shoulder replacement    . Colonoscopy    . Polypectomy    . Pacemaker insertion    . Lobectomy    . Total knee arthroplasty Bilateral   . Cataract extraction      There were no vitals filed for this visit.  Visit Diagnosis:  Pain in left shoulder  Muscle weakness      Subjective Assessment - 06/05/15 1633    Subjective Patient reports he is  still having pain in left shoulder that is affecting his ability to sleep on left side and he cannot use his left arm like he would like to due to pain. He is exercising as instructed with isometrics and scapular adduction with pain.Marland Kitchen He is scheduling an evaluation with the surgeon regarding possible surgery on shoulder in the near future.    Limitations Lifting;House hold activities   Patient Stated Goals Patient would like to decrease pain in left sholder and be able to use left arm with less difficulty and preparing for future TSA   Currently in Pain? Yes   Pain Score 0-No pain  increased pain with movement   Pain Location Shoulder   Pain Orientation Left   Pain Descriptors / Indicators Aching;Other (Comment)  sharp pain   Pain Type Chronic pain   Pain Onset More than a month ago   Pain Frequency Constant   Multiple Pain Sites No     Objective: Treatment: Exercise: supine lying AAROM left shoulder with rhythmic stabilization at 90 degrees elevation x 3 sets 10 reps, ER/IR at side, plane of scapula 3 sets x 10 reps, side lying ER with assistance and verbal cuing  for short arc pain free or mild pain in left shoulder, instructed in home exercises to perform with handout reviewed  Moist heat:  x 12 min. Applied with moist heat pack to left shoulder with patient seated in chair at end of session    Patient response to treatment: demonstrated good technique with verbal and tactile cuing for correct alignment of left shoulder for side lying exercises and supine stabilization, no adverse reaction to moist heat noted          PT Education - 06/05/15 1645    Education provided Yes   Education Details re assessed home exercises and added side lying ER and supine rhythmic stabilization exercise (rotation with arm at 90 degrees elevation) to home program   Person(s) Educated Patient   Methods Explanation;Demonstration;Verbal cues;Handout   Comprehension Verbalized understanding;Returned  demonstration;Verbal cues required             PT Long Term Goals - 05/27/15 1100    PT LONG TERM GOAL #1   Title Patient will demonstrate improvement in functional use of left shoulder/UE with improved QuickDash score of 30% or better by 07/09/2015   Baseline current quick Dash score 40% impairment   Status New   PT LONG TERM GOAL #2   Title Patient will demonstrate independent home exericses and improved control of scapular musculature without cuing by 07/09/2015   Baseline limited to no knowledge of appropriate exercises to perform to improve strength/scontrol left shoulder   Status New               Plan - 06/05/15 1715    Clinical Impression Statement Patient continues with primary limiting factor of pain in left shoulder limiting progression of exercise and limiting ability to perform ADL's with left UE without difficulty. He is going to see surgeon regarding possible TSA and will continue with physical therapy to address pain and weakness in order to strengthen stabilizers.   Pt will benefit from skilled therapeutic intervention in order to improve on the following deficits Decreased strength;Pain;Impaired UE functional use   Rehab Potential Fair   Clinical Impairments Affecting Rehab Potential (-) chronic condition, pain  (+) motivated   PT Frequency 2x / week   PT Duration 6 weeks   PT Treatment/Interventions Patient/family education;Moist Heat;Electrical Stimulation;Therapeutic exercise;Cryotherapy;Manual techniques   PT Next Visit Plan pain control, progressive strengthening and motor control exercises left UE/shoulder   PT Home Exercise Plan added ER and stabilization in supine         Problem List Patient Active Problem List   Diagnosis Date Noted  . Absolute anemia 04/12/2015  . Atrophic kidney 04/12/2015  . Essential (primary) hypertension 04/12/2015  . Acid reflux 04/12/2015  . Cancer of lung 04/12/2015  . CA of prostate 04/12/2015  . H/O adenomatous  polyp of colon 01/15/2015  . Temporary cerebral vascular dysfunction 11/21/2014  . Calcific shoulder tendinitis 08/01/2014  . Cervical nerve root disorder 04/07/2012    [name] PT 06/06/2015, 9:22 AM  [place] [address] [place], [address] Phone: [phone]   Fax:  [phone]

## 2015-06-10 ENCOUNTER — Encounter: Payer: PPO | Admitting: Physical Therapy

## 2015-06-10 ENCOUNTER — Ambulatory Visit: Payer: PPO | Attending: Internal Medicine | Admitting: Physical Therapy

## 2015-06-10 DIAGNOSIS — M6281 Muscle weakness (generalized): Secondary | ICD-10-CM | POA: Diagnosis present

## 2015-06-10 DIAGNOSIS — M25512 Pain in left shoulder: Secondary | ICD-10-CM | POA: Diagnosis present

## 2015-06-10 DIAGNOSIS — M19019 Primary osteoarthritis, unspecified shoulder: Secondary | ICD-10-CM | POA: Insufficient documentation

## 2015-06-11 NOTE — Therapy (Signed)
[facility] [address] [place], [address] Phone: [phone]   Fax:  [phone]  Physical Therapy Treatment  Patient Details  Name: Rick Mcbride. MRN: [id number] Date of Birth: 07/29/1941 Referring Provider:  Idelle Crouch, MD  Encounter Date: 06/10/2015      PT End of Session - 06/10/15 1715    Visit Number 3   Number of Visits 12   Date for PT Re-Evaluation 07/09/15   Authorization Type 3   Authorization Time Period 10   PT Start Time 1633   PT Stop Time 1710   PT Time Calculation (min) 37 min   Activity Tolerance Patient tolerated treatment well;Patient limited by pain   Behavior During Therapy [facility] for tasks assessed/performed      Past Medical History  Diagnosis Date  . Heart disease   . GERD (gastroesophageal reflux disease)   . Hyperlipidemia   . Sleep apnea   . Hypertension   . Neuralgia   . Hypercholesteremia   . Sinoatrial node dysfunction   . DDD (degenerative disc disease), lumbar   . Headache   . Meralgia paresthetica   . Atrophic kidney   . Osteoarthritis   . Anxiety   . Anemia   . Iliac aneurysm   . Iliac aneurysm   . Second degree AV block   . Prostate cancer   . Lung cancer   . AAA (abdominal aortic aneurysm)   . Lung cancer   . TIA (transient ischemic attack)   . Cervical radiculopathy   . Stroke     Past Surgical History  Procedure Laterality Date  . Coronary atherosclerosis of autologous vein bypass graft    . Coronary artery bypass graft    . Knee arthroscopy    . Prostatectomy    . Total shoulder replacement    . Colonoscopy    . Polypectomy    . Pacemaker insertion    . Lobectomy    . Total knee arthroplasty Bilateral   . Cataract extraction      There were no vitals filed for this visit.  Visit Diagnosis:  Pain in left shoulder  Muscle weakness      Subjective Assessment - 06/10/15 1642    Subjective still with left shoulder  pain with problem with sleeping   Limitations Lifting;House hold activities   Currently in Pain? Yes   Pain Score --  increases to a 16/60 with certain movements           [facility] Adult PT Treatment/Exercise - 06/10/15 1644    Exercises   Exercises Other Exercises: (2 sets): supine lying scapular adduction with manual resistance x 10, RS at 90 degrees flexion x 10, RS IR/ER with arm close to trunk light resistance 2 x 10, AAROM flexion/extension of elbow 1 x 10 with noted increased popping in left shoulder, forward elevation with assistance short arc x 10,  Sitting: chest press with graded manual resistance light resistance given 2 x 5 reps, scapular rows 2 x 5 reps with graded MR      Patient response to treatment: required guidance, verbal cuing and graded resistance to complete all exercises without exacerbation of pain.            PT Education - 06/10/15 1644    Education provided Yes   Education Details educated in exercises for stabilization in supine   Person(s) Educated Patient   Methods Explanation;Verbal cues   Comprehension Verbalized  understanding;Returned demonstration;Verbal cues required             PT Long Term Goals - 05/27/15 1100    PT LONG TERM GOAL #1   Title Patient will demonstrate improvement in functional use of left shoulder/UE with improved QuickDash score of 30% or better by 07/09/2015   Baseline current quick Dash score 40% impairment   Status New   PT LONG TERM GOAL #2   Title Patient will demonstrate independent home exericses and improved control of scapular musculature without cuing by 07/09/2015   Baseline limited to no knowledge of appropriate exercises to perform to improve strength/scontrol left shoulder   Status New               Plan - 06/10/15 1715    Clinical Impression Statement Patient progressing with exercises with guidance and verbal cuing.    Pt will benefit from skilled therapeutic intervention in order to improve  on the following deficits Decreased strength;Pain;Impaired UE functional use   Rehab Potential Fair   PT Frequency 2x / week   PT Duration 6 weeks   PT Treatment/Interventions Patient/family education;Moist Heat;Electrical Stimulation;Therapeutic exercise;Cryotherapy;Manual techniques   PT Next Visit Plan pain control, progressive strengthening and motor control exercises left UE/shoulder        Problem List Patient Active Problem List   Diagnosis Date Noted  . Absolute anemia 04/12/2015  . Atrophic kidney 04/12/2015  . Essential (primary) hypertension 04/12/2015  . Acid reflux 04/12/2015  . Cancer of lung 04/12/2015  . CA of prostate 04/12/2015  . H/O adenomatous polyp of colon 01/15/2015  . Temporary cerebral vascular dysfunction 11/21/2014  . Calcific shoulder tendinitis 08/01/2014  . Cervical nerve root disorder 04/07/2012    [name] PT 06/11/2015, 7:51 PM  [place] [facility] [address] [place], [address] Phone: [phone]   Fax:  [phone]

## 2015-06-12 ENCOUNTER — Encounter: Payer: PPO | Admitting: Physical Therapy

## 2015-06-12 ENCOUNTER — Ambulatory Visit: Payer: PPO | Admitting: Physical Therapy

## 2015-06-12 DIAGNOSIS — M6281 Muscle weakness (generalized): Secondary | ICD-10-CM

## 2015-06-12 DIAGNOSIS — M25512 Pain in left shoulder: Secondary | ICD-10-CM

## 2015-06-12 NOTE — Therapy (Signed)
[facility] [address], [place], [address] Phone: [phone]   Fax:  [phone]  Physical Therapy Treatment  Patient Details  Name: Rick Mcbride. MRN: [id number] Date of Birth: 1941-07-08 Referring Provider:  Idelle Crouch, MD  Encounter Date: 06/12/2015      PT End of Session - 06/12/15 1715    Visit Number 4   Number of Visits 12   Date for PT Re-Evaluation 07/09/15   Authorization Type 4   Authorization Time Period 10   PT Start Time 1633   PT Stop Time 1710   PT Time Calculation (min) 37 min   Activity Tolerance Patient tolerated treatment well;Patient limited by pain   Behavior During Therapy [facility] for tasks assessed/performed      Past Medical History  Diagnosis Date  . Heart disease   . GERD (gastroesophageal reflux disease)   . Hyperlipidemia   . Sleep apnea   . Hypertension   . Neuralgia   . Hypercholesteremia   . Sinoatrial node dysfunction   . DDD (degenerative disc disease), lumbar   . Headache   . Meralgia paresthetica   . Atrophic kidney   . Osteoarthritis   . Anxiety   . Anemia   . Iliac aneurysm   . Iliac aneurysm   . Second degree AV block   . Prostate cancer   . Lung cancer   . AAA (abdominal aortic aneurysm)   . Lung cancer   . TIA (transient ischemic attack)   . Cervical radiculopathy   . Stroke     Past Surgical History  Procedure Laterality Date  . Coronary atherosclerosis of autologous vein bypass graft    . Coronary artery bypass graft    . Knee arthroscopy    . Prostatectomy    . Total shoulder replacement    . Colonoscopy    . Polypectomy    . Pacemaker insertion    . Lobectomy    . Total knee arthroplasty Bilateral   . Cataract extraction      There were no vitals filed for this visit.  Visit Diagnosis:  Pain in left shoulder  Muscle weakness      Subjective Assessment - 06/12/15 1634    Subjective Patient reports being sore  today. He is exercising at home as instructed with 2# weight   Patient Stated Goals Patient would like to decrease pain in left sholder and be able to use left arm with less difficulty and preparing for future TSA   Currently in Pain? Yes   Pain Score 2    Pain Location Shoulder   Pain Orientation Left   Pain Descriptors / Indicators Aching   Pain Type Chronic pain   Pain Onset More than a month ago   Pain Frequency Constant   Multiple Pain Sites No           [facility] Adult PT Treatment/Exercise - 06/12/15 1638    Exercises   Exercises Other Exercises: (2 sets): supine lying scapular adduction with manual resistance x 10, RS at 90 degrees flexion x 10, RS IR/ER with arm close to trunk light resistance 2 x 10, forward elevation with assistance short arc x 10, 2 positions  Sitting: chest press with graded manual resistance light resistance given 2 x 5 reps, scapular rows 2 x 5 reps with graded MR       Patient response to treatment: required guidance, verbal cuing and graded resistance to  complete all exercises without exacerbation of pain. Patient demonstrated painful motion between 60 and 90 degrees of elevation         PT Education - 06/12/15 1715    Education provided Yes   Education Details continue with home exercises, limit ROM and weight exercises   Person(s) Educated Patient   Methods Explanation   Comprehension Verbalized understanding             PT Long Term Goals - 05/27/15 1100    PT LONG TERM GOAL #1   Title Patient will demonstrate improvement in functional use of left shoulder/UE with improved QuickDash score of 30% or better by 07/09/2015   Baseline current quick Dash score 40% impairment   Status New   PT LONG TERM GOAL #2   Title Patient will demonstrate independent home exericses and improved control of scapular musculature without cuing by 07/09/2015   Baseline limited to no knowledge of appropriate exercises to perform to improve strength/scontrol  left shoulder   Status New               Plan - 06/12/15 1715    Clinical Impression Statement Patient was able to perform exercises with assistance and verbal cuing to decrease pain and modifiy ROM to avoid increased pain in shoulder.    Pt will benefit from skilled therapeutic intervention in order to improve on the following deficits Decreased strength;Pain;Impaired UE functional use   Rehab Potential Fair   Clinical Impairments Affecting Rehab Potential (-) chronic condition, pain  (+) motivated   PT Frequency 2x / week   PT Duration 6 weeks   PT Treatment/Interventions Patient/family education;Moist Heat;Electrical Stimulation;Therapeutic exercise;Cryotherapy;Manual techniques   PT Next Visit Plan pain control, progressive strengthening and motor control exercises left UE/shoulder        Problem List Patient Active Problem List   Diagnosis Date Noted  . Absolute anemia 04/12/2015  . Atrophic kidney 04/12/2015  . Essential (primary) hypertension 04/12/2015  . Acid reflux 04/12/2015  . Cancer of lung 04/12/2015  . CA of prostate 04/12/2015  . H/O adenomatous polyp of colon 01/15/2015  . Temporary cerebral vascular dysfunction 11/21/2014  . Calcific shoulder tendinitis 08/01/2014  . Cervical nerve root disorder 04/07/2012    [name] PT 06/12/2015, 8:58 PM  [place] [facility] [address] [place], [address] Phone: [phone]   Fax:  [phone]

## 2015-06-13 ENCOUNTER — Other Ambulatory Visit: Payer: Self-pay | Admitting: Surgery

## 2015-06-13 DIAGNOSIS — M25512 Pain in left shoulder: Secondary | ICD-10-CM

## 2015-06-13 DIAGNOSIS — M19012 Primary osteoarthritis, left shoulder: Secondary | ICD-10-CM

## 2015-06-17 ENCOUNTER — Ambulatory Visit: Payer: PPO | Admitting: Physical Therapy

## 2015-06-17 ENCOUNTER — Encounter: Payer: PPO | Admitting: Physical Therapy

## 2015-06-17 ENCOUNTER — Encounter: Payer: Self-pay | Admitting: Physical Therapy

## 2015-06-17 DIAGNOSIS — M6281 Muscle weakness (generalized): Secondary | ICD-10-CM

## 2015-06-17 DIAGNOSIS — M25512 Pain in left shoulder: Secondary | ICD-10-CM | POA: Diagnosis not present

## 2015-06-17 NOTE — Therapy (Signed)
[place] [facility] [address], [place], [address] Phone: [phone]   Fax:  [phone]  Physical Therapy Treatment  Patient Details  Name: Rick Mcbride. MRN: [id number] Date of Birth: [birth year] Referring Provider:  Idelle Crouch, MD  Encounter Date: 06/17/2015      PT End of Session - 06/17/15 1522    Visit Number 5   Number of Visits 12   Date for PT Re-Evaluation 07/09/15   Authorization Type 5   Authorization Time Period 10   PT Start Time 1435   PT Stop Time 1515   PT Time Calculation (min) 40 min   Activity Tolerance Patient tolerated treatment well   Behavior During Therapy [facility] for tasks assessed/performed      Past Medical History  Diagnosis Date  . Heart disease   . GERD (gastroesophageal reflux disease)   . Hyperlipidemia   . Sleep apnea   . Hypertension   . Neuralgia   . Hypercholesteremia   . Sinoatrial node dysfunction   . DDD (degenerative disc disease), lumbar   . Headache   . Meralgia paresthetica   . Atrophic kidney   . Osteoarthritis   . Anxiety   . Anemia   . Iliac aneurysm   . Iliac aneurysm   . Second degree AV block   . Prostate cancer   . Lung cancer   . AAA (abdominal aortic aneurysm)   . Lung cancer   . TIA (transient ischemic attack)   . Cervical radiculopathy   . Stroke     Past Surgical History  Procedure Laterality Date  . Coronary atherosclerosis of autologous vein bypass graft    . Coronary artery bypass graft    . Knee arthroscopy    . Prostatectomy    . Total shoulder replacement    . Colonoscopy    . Polypectomy    . Pacemaker insertion    . Lobectomy    . Total knee arthroplasty Bilateral   . Cataract extraction      There were no vitals filed for this visit.  Visit Diagnosis:  Pain in left shoulder  Muscle weakness      Subjective Assessment - 06/17/15 1437    Subjective Patient is still having pain in left shoulder. He  is exercising at home as instructed. He is noticing less popping in shoulder and still has pain with certain movements (elevates to a 10/10). He is still having difficulty with sleeping due to pain as well.    Limitations Lifting;House hold activities   Patient Stated Goals Patient would like to decrease pain in left sholder and be able to use left arm with less difficulty and preparing for future TSA   Currently in Pain? Yes   Pain Score 2    Pain Location Shoulder   Pain Orientation Left   Pain Descriptors / Indicators Aching   Pain Type Chronic pain   Pain Onset More than a month ago             [facility] Adult PT Treatment/Exercise - 06/17/15 1439    Exercises   Exercises Other Exercises   Other Exercises         Modalities: moist heat therapy: unbilled   (2 sets): supine lying scapular adduction with manual resistance x 10, RS at 90 degrees flexion x 10, RS IR/ER with arm close to trunk light resistance 2 x 10, forward elevation with assistance short arc x 10,  2 positions  Sitting: chest press with graded manual resistance light resistance given 2 x 5 reps, scapular rows 2 x 5 reps with graded MR   Moist heat applied to left shoulder x 10 min with patient seated following exercises      Patient response to treatment: required guidance, verbal cuing and graded resistance to complete all exercises without exacerbation of pain. Patient demonstrated painful motion between 60 and 90 degrees of elevation             PT Education - 06/17/15 1515    Education provided Yes   Education Details instructed to exercise both UE's for improved control of left UE   Person(s) Educated Patient   Methods Explanation;Demonstration;Verbal cues   Comprehension Verbalized understanding;Returned demonstration;Verbal cues required             PT Long Term Goals - 05/27/15 1100    PT LONG TERM GOAL #1   Title Patient will demonstrate improvement in functional use of left  shoulder/UE with improved QuickDash score of 30% or better by 07/09/2015   Baseline current quick Dash score 40% impairment   Status New   PT LONG TERM GOAL #2   Title Patient will demonstrate independent home exericses and improved control of scapular musculature without cuing by 07/09/2015   Baseline limited to no knowledge of appropriate exercises to perform to improve strength/scontrol left shoulder   Status New               Plan - 06/17/15 1523    Clinical Impression Statement Patient demonstrates improved control and strength with left UE/shoulder without incresed pain during treatment session. He does have increased pain with pushing activities in sitting and elevation/depression exercises in sitting. He is going for CT scan this week and will then follow up with MD regarding possible surgery.    Pt will benefit from skilled therapeutic intervention in order to improve on the following deficits Decreased strength;Pain;Impaired UE functional use   Rehab Potential Fair   PT Frequency 2x / week   PT Duration 6 weeks   PT Treatment/Interventions Patient/family education;Moist Heat;Electrical Stimulation;Therapeutic exercise;Cryotherapy;Manual techniques   PT Next Visit Plan pain control, progressive strengthening and motor control exercises left UE/shoulder        Problem List Patient Active Problem List   Diagnosis Date Noted  . Absolute anemia 04/12/2015  . Atrophic kidney 04/12/2015  . Essential (primary) hypertension 04/12/2015  . Acid reflux 04/12/2015  . Cancer of lung 04/12/2015  . CA of prostate 04/12/2015  . H/O adenomatous polyp of colon 01/15/2015  . Temporary cerebral vascular dysfunction 11/21/2014  . Calcific shoulder tendinitis 08/01/2014  . Cervical nerve root disorder 04/07/2012    [name] PT 06/17/2015, 3:26 PM  [place] [facility] [address], [place], [address] Phone:  [phone]   Fax:  [phone]

## 2015-06-19 ENCOUNTER — Ambulatory Visit
Admission: RE | Admit: 2015-06-19 | Discharge: 2015-06-19 | Disposition: A | Discharge status: Home / Self Care | Payer: PPO | Source: Ambulatory Visit | Attending: Surgery | Admitting: Surgery

## 2015-06-19 ENCOUNTER — Encounter: Payer: PPO | Admitting: Physical Therapy

## 2015-06-19 DIAGNOSIS — M25512 Pain in left shoulder: Secondary | ICD-10-CM

## 2015-06-19 DIAGNOSIS — M19012 Primary osteoarthritis, left shoulder: Secondary | ICD-10-CM

## 2015-06-23 ENCOUNTER — Ambulatory Visit
Admission: RE | Admit: 2015-06-23 | Discharge: 2015-06-23 | Disposition: A | Discharge status: Home / Self Care | Payer: PPO | Source: Ambulatory Visit | Attending: Surgery | Admitting: Surgery

## 2015-06-23 ENCOUNTER — Encounter: Payer: PPO | Admitting: Physical Therapy

## 2015-06-23 DIAGNOSIS — M25512 Pain in left shoulder: Secondary | ICD-10-CM | POA: Diagnosis present

## 2015-06-23 MED ORDER — IOHEXOL 300 MG/ML  SOLN: 50.0000 mL | Freq: Once | INTRAMUSCULAR | Status: DC | PRN

## 2015-06-24 ENCOUNTER — Encounter: Payer: Self-pay | Admitting: Physical Therapy

## 2015-06-24 ENCOUNTER — Ambulatory Visit: Payer: PPO | Admitting: Physical Therapy

## 2015-06-24 DIAGNOSIS — M25512 Pain in left shoulder: Secondary | ICD-10-CM

## 2015-06-24 DIAGNOSIS — M6281 Muscle weakness (generalized): Secondary | ICD-10-CM

## 2015-06-25 NOTE — Therapy (Signed)
[place] [facility] [address] [place], [address] Phone: [phone]   Fax:  [phone]  Physical Therapy Treatment  Patient Details  Name: Rick Mcbride. MRN: [id number] Date of Birth: [birth year] Referring Provider:  Idelle Crouch, MD  Encounter Date: 06/24/2015      PT End of Session - 06/24/15 1648    Visit Number 6   Number of Visits 12   Date for PT Re-Evaluation 07/09/15   Authorization Type 6   Authorization Time Period 10   PT Start Time 1645   PT Stop Time 1720   PT Time Calculation (min) 35 min   Activity Tolerance Tolerated treatment well   Behavior During Therapy [facility] for tasks performed      Past Medical History  Diagnosis Date  . Heart disease   . GERD (gastroesophageal reflux disease)   . Hyperlipidemia   . Sleep apnea   . Hypertension   . Neuralgia   . Hypercholesteremia   . Sinoatrial node dysfunction   . DDD (degenerative disc disease), lumbar   . Headache   . Meralgia paresthetica   . Atrophic kidney   . Osteoarthritis   . Anxiety   . Anemia   . Iliac aneurysm   . Iliac aneurysm   . Second degree AV block   . Prostate cancer   . Lung cancer   . AAA (abdominal aortic aneurysm)   . Lung cancer   . TIA (transient ischemic attack)   . Cervical radiculopathy   . Stroke     Past Surgical History  Procedure Laterality Date  . Coronary atherosclerosis of autologous vein bypass graft    . Coronary artery bypass graft    . Knee arthroscopy    . Prostatectomy    . Total shoulder replacement    . Colonoscopy    . Polypectomy    . Pacemaker insertion    . Lobectomy    . Total knee arthroplasty Bilateral   . Cataract extraction      There were no vitals filed for this visit.  Visit Diagnosis:  Pain in left shoulder  Muscle weakness      Subjective Assessment - 06/24/15 1641    Subjective Patient is still having pain in left shoulder. He is exercising at  home as instructed. Had CT scan of left shoulder yesterday. He will follow up with MD this Friday to discuss surgery. He reports he is sore today in left shoulder because he had to stop his anti inflammatory medication prior to CT scan x 5 days.    Patient Stated Goals Patient would like to decrease pain in left sholder and be able to use left arm with less difficulty and preparing for future TSA   Currently in Pain? Yes   Pain Score 5    Pain Location Shoulder   Pain Orientation Left   Pain Descriptors / Indicators Aching   Pain Type Chronic pain   Pain Onset More than a month ago          [facility] Adult PT Treatment/Exercise - 06/24/15 1643    Exercises   Exercises Other Exercises   Other Exercises  Supine lying AAROM left shoulder with rhythmic stabilization at 90 degrees for forward elevation and extension x 3 sets, IR and ER with left UE at side in neutral position 3 sets x 10 reps, AAROM ER off trunk with guided motion in short arc with increased motion each  repetition with monitoring for pain, sitting isometric exercises forIR, ER, forward elevation all in neurtral positions x 10 reps each with 5 second holds followed by AAROM forward elevation to forehead (hands clasped) x 5 reps with verbal cuing for correct shoulder alignment and monitoring for pain    Moist heat applied to left shoulder x 10 min. Following exercises with patient seated in chair  Patient response to treatment: patient reported significant decreased in soreness in left shoulder and did not have any pain in shoulder at end of session with forward elevation in sitting with AAROM, He required verbal and tactile cuing to perform exercises with correct alignment of left shoulder and with monitoring for pain throughout session             PT Education - 06/24/15 1645    Education provided Yes   Education Details Reviewed home exercises: 3x/day, isometric exercises against door frame. every other day he is exercising  with weights in supine lying position with chest press and side lying and supine lying no weight for ER of shoulder.   Person(s) Educated Patient   Methods Explanation   Comprehension Verbalized understanding             PT Long Term Goals - 05/27/15 1100    PT LONG TERM GOAL #1   Title Patient will demonstrate improvement in functional use of left shoulder/UE with improved QuickDash score of 30% or better by 07/09/2015   Baseline current quick Dash score 40% impairment   Status New   PT LONG TERM GOAL #2   Title Patient will demonstrate independent home exericses and improved control of scapular musculature without cuing by 07/09/2015   Baseline limited to no knowledge of appropriate exercises to perform to improve strength/scontrol left shoulder   Status New               Plan - 06/24/15 1730    Clinical Impression Statement Patient demonstrated decreased pain and improved strength in left shoulder following session of isometric and rhythmic stabilization exercises. Much improved per patient report folllowing treatment.    Pt will benefit from skilled therapeutic intervention in order to improve on the following deficits Decreased strength;Pain;Impaired UE functional use   Rehab Potential Fair   PT Frequency 2x / week   PT Duration 6 weeks   PT Treatment/Interventions Patient/family education;Moist Heat;Electrical Stimulation;Therapeutic exercise;Cryotherapy;Manual techniques   PT Next Visit Plan pain control, progressive strengthening and motor control exercises left UE/shoulder        Problem List Patient Active Problem List   Diagnosis Date Noted  . Absolute anemia 04/12/2015  . Atrophic kidney 04/12/2015  . Essential (primary) hypertension 04/12/2015  . Acid reflux 04/12/2015  . Cancer of lung 04/12/2015  . CA of prostate 04/12/2015  . H/O adenomatous polyp of colon 01/15/2015  . Temporary cerebral vascular dysfunction 11/21/2014  . Calcific shoulder  tendinitis 08/01/2014  . Cervical nerve root disorder 04/07/2012    [name] PT 06/25/2015, 1:55 PM  [place] [facility] [address], [place], [address] Phone: [phone]   Fax:  [phone]

## 2015-06-26 ENCOUNTER — Encounter: Payer: PPO | Admitting: Physical Therapy

## 2015-06-30 ENCOUNTER — Encounter: Payer: PPO | Admitting: Physical Therapy

## 2015-07-01 ENCOUNTER — Encounter: Payer: Self-pay | Admitting: Physical Therapy

## 2015-07-01 ENCOUNTER — Ambulatory Visit: Payer: PPO | Admitting: Physical Therapy

## 2015-07-01 DIAGNOSIS — M6281 Muscle weakness (generalized): Secondary | ICD-10-CM

## 2015-07-01 DIAGNOSIS — M25512 Pain in left shoulder: Secondary | ICD-10-CM

## 2015-07-02 NOTE — Therapy (Signed)
[place] [facility] [address], [place], [address] Phone: [phone]   Fax:  [phone]  Physical Therapy Treatment/Discharge Summary  Patient Details  Name: Rick Mcbride. MRN: [id number] Date of Birth: [birth year] Referring Provider:  Idelle Crouch, MD  Encounter Date: 07/01/2015   Patient began physical therapy treatment 05/27/2015 and has attended 7 session through 07/01/2015 He has achieved goal of independence with home exercises and is currently preparing to have total shoulder arthroplasty next week 08/08/2015 Plan: discharge from physical therapy due to having surgery      PT End of Session - 07/01/15 1715    Visit Number 7   Number of Visits 12   Date for PT Re-Evaluation 07/09/15   Authorization Type 7   Authorization Time Period 10   PT Start Time 1623   PT Stop Time 1710   PT Time Calculation (min) 47 min   Activity Tolerance Patient tolerated treatment well   Behavior During Therapy [facility] for tasks assessed/performed      Past Medical History  Diagnosis Date  . Heart disease   . GERD (gastroesophageal reflux disease)   . Hyperlipidemia   . Sleep apnea   . Hypertension   . Neuralgia   . Hypercholesteremia   . Sinoatrial node dysfunction   . DDD (degenerative disc disease), lumbar   . Headache   . Meralgia paresthetica   . Atrophic kidney   . Osteoarthritis   . Anxiety   . Anemia   . Iliac aneurysm   . Iliac aneurysm   . Second degree AV block   . Prostate cancer   . Lung cancer   . AAA (abdominal aortic aneurysm)   . Lung cancer   . TIA (transient ischemic attack)   . Cervical radiculopathy   . Stroke     Past Surgical History  Procedure Laterality Date  . Coronary atherosclerosis of autologous vein bypass graft    . Coronary artery bypass graft    . Knee arthroscopy    . Prostatectomy    . Total shoulder replacement    . Colonoscopy    . Polypectomy    .  Pacemaker insertion    . Lobectomy    . Total knee arthroplasty Bilateral   . Cataract extraction      There were no vitals filed for this visit.  Visit Diagnosis:  Pain in left shoulder  Muscle weakness      Subjective Assessment - 07/01/15 1625    Subjective Patient reports he was sore following last session. Patient reports he is having pain in left shoulder that is affecting his ability to sleep on left side and he cannot use his left arm like he would like to. He is scheduled for TSA next week 07/08/2015.  Pain in shoulder is mild and constant 2-4/10     objective:  AROM: left shoulder WFL's all planes of motion with increased pain with ROM above shoulder level Strength/control: improved scapular control/strength as compared to initial assessment, gross strength in left shoulder at least 4-/5 all major muscle groups with pain in left shoulder       [facility] Adult PT Treatment/Exercise - 07/01/15 1657    Exercises   Exercises Other Exercises   Other Exercises   Supine lying AAROM left shoulder with rhythmic stabilization at 90 degrees for forward elevation and extension x 3 sets, IR and ER with left UE at side in neutral position 3 sets  x 10 reps, resistive (moderate manual) ER off trunk with guided motion in short arc with increased motion each repetition with monitoring for pain, sitting isometric exercises for IR, ER, forward elevation all in neurtral positions x 10 reps each with 5 second holds with cuing for correct shoulder alignment and monitoring for pain     moist heat applied to left shoulder x 15 min. Following exercise with patient seated in chair; no adverse effects noted with moist heat Patient response to treatment: able to tolerate increased intensity of exercise today with resistive exercises, continues with pain at primary limiting factor, required guidance and verbal cuing to complete exercises with good alignment of shoulder          PT Education -  07/15/2015 1700    Education provided Yes   Education Details Reviewed home exercises to be done as he prepares for surgery 08/08/2015 TSA left shoulder   Person(s) Educated Patient   Methods Explanation   Comprehension Verbalized understanding             PT Long Term Goals - 07-15-15 1722    PT LONG TERM GOAL #1   Title Patient will demonstrate improvement in functional use of left shoulder/UE with improved QuickDash score of 30% or better by 07/09/2015   Baseline current quick Dash score 40% impairment   Status Not Met   PT LONG TERM GOAL #2   Title Patient will demonstrate independent home exericses and improved control of scapular musculature without cuing by 07/09/2015   Baseline limited to no knowledge of appropriate exercises to perform to improve strength/scontrol left shoulder   Status Achieved               Plan - 15-Jul-2015 1715    Clinical Impression Statement Patient demonstrates good understanding of home exercises as he prepares for surgery (left TSA). He continues with pain and difficulty with sleeping. His current impairment level remains 40% due to continued pain in his shoulder. He should progress well following TSA.   Rehab Potential Fair   PT Frequency 2x / week   PT Duration 6 weeks   PT Treatment/Interventions Patient/family education;Moist Heat;Electrical Stimulation;Therapeutic exercise;Cryotherapy;Manual techniques          G-Codes - 07/15/2015 1727    Functional Assessment Tool Used QuickDash, pain scale, clinical judgment   Functional Limitation Carrying, moving and handling objects   Carrying, Moving and Handling Objects Goal Status ([id number]) At least 20 percent but less than 40 percent impaired, limited or restricted   Carrying, Moving and Handling Objects Discharge Status [phone]) At least 40 percent but less than 60 percent impaired, limited or restricted      Problem List Patient Active Problem List   Diagnosis Date Noted  . Absolute anemia  04/12/2015  . Atrophic kidney 04/12/2015  . Essential (primary) hypertension 04/12/2015  . Acid reflux 04/12/2015  . Cancer of lung 04/12/2015  . CA of prostate 04/12/2015  . H/O adenomatous polyp of colon 01/15/2015  . Temporary cerebral vascular dysfunction 11/21/2014  . Calcific shoulder tendinitis 08/01/2014  . Cervical nerve root disorder 04/07/2012    [name] PT 07/02/2015, 4:48 PM  [place] [facility] [address] [place], [address] Phone: [phone]   Fax:  [phone]

## 2015-07-03 ENCOUNTER — Encounter
Admission: RE | Admit: 2015-07-03 | Discharge: 2015-07-03 | Disposition: A | Discharge status: Home / Self Care | Payer: PPO | Source: Ambulatory Visit | Attending: Surgery | Admitting: Surgery

## 2015-07-03 ENCOUNTER — Encounter: Payer: PPO | Admitting: Physical Therapy

## 2015-07-03 DIAGNOSIS — Z01812 Encounter for preprocedural laboratory examination: Secondary | ICD-10-CM | POA: Insufficient documentation

## 2015-07-03 HISTORY — DX: Atherosclerotic heart disease of native coronary artery without angina pectoris: I25.10

## 2015-07-03 HISTORY — DX: Presence of cardiac pacemaker: Z95.0

## 2015-07-03 HISTORY — DX: Abdominal aortic aneurysm, without rupture: I71.4

## 2015-07-03 LAB — CBC
HCT: 42.7 % (ref 40.0–52.0)
Hemoglobin: 14.2 g/dL (ref 13.0–18.0)
MCH: 30.6 pg (ref 26.0–34.0)
MCHC: 33.3 g/dL (ref 32.0–36.0)
MCV: 91.8 fL (ref 80.0–100.0)
PLATELETS: 139 10*3/uL — AB (ref 150–440)
RBC: 4.65 MIL/uL (ref 4.40–5.90)
RDW: 13.2 % (ref 11.5–14.5)
WBC: 6.7 10*3/uL (ref 3.8–10.6)

## 2015-07-03 LAB — URINALYSIS COMPLETE WITH MICROSCOPIC (ARMC ONLY)
BACTERIA UA: NONE SEEN
Bilirubin Urine: NEGATIVE
GLUCOSE, UA: NEGATIVE mg/dL
HGB URINE DIPSTICK: NEGATIVE
Ketones, ur: NEGATIVE mg/dL
Leukocytes, UA: NEGATIVE
Nitrite: NEGATIVE
PROTEIN: NEGATIVE mg/dL
SQUAMOUS EPITHELIAL / LPF: NONE SEEN
Specific Gravity, Urine: 1.017 (ref 1.005–1.030)
pH: 7 (ref 5.0–8.0)

## 2015-07-03 LAB — TYPE AND SCREEN
ABO/RH(D): O POS
Antibody Screen: NEGATIVE

## 2015-07-03 LAB — APTT: APTT: 28 s (ref 24–36)

## 2015-07-03 LAB — BASIC METABOLIC PANEL
ANION GAP: 6 (ref 5–15)
BUN: 21 mg/dL — AB (ref 6–20)
CO2: 29 mmol/L (ref 22–32)
Calcium: 9.6 mg/dL (ref 8.9–10.3)
Chloride: 105 mmol/L (ref 101–111)
Creatinine, Ser: 1.72 mg/dL — ABNORMAL HIGH (ref 0.61–1.24)
GFR calc Af Amer: 43 mL/min — ABNORMAL LOW (ref >60)
GFR, EST NON AFRICAN AMERICAN: 37 mL/min — AB (ref >60)
Glucose, Bld: 88 mg/dL (ref 65–99)
POTASSIUM: 4.7 mmol/L (ref 3.5–5.1)
SODIUM: 140 mmol/L (ref 135–145)

## 2015-07-03 LAB — PROTIME-INR
INR: 1
PROTHROMBIN TIME: 13.4 s (ref 11.4–15.0)

## 2015-07-03 LAB — SURGICAL PCR SCREEN
MRSA, PCR: NEGATIVE
STAPHYLOCOCCUS AUREUS: NEGATIVE

## 2015-07-03 LAB — SEDIMENTATION RATE: SED RATE: 7 mm/h (ref 0–20)

## 2015-07-03 NOTE — Patient Instructions (Signed)
  Your procedure is scheduled TI:[id number] 1, 2016 (Thursday) Report to Day Surgery.[facility]) To find out your arrival time please call [phone] between 1PM - 3PM on(Wednesday) August 31, 2016x  Remember: Instructions that are not followed completely may result in serious medical risk, up to and including death, or upon the discretion of your surgeon and anesthesiologist your surgery may need to be rescheduled.    __x__ 1. Do not eat food or drink liquids after midnight. No gum chewing or hard candies.     ____ 2. No Alcohol for 24 hours before or after surgery.   ____ 3. Bring all medications with you on the day of surgery if instructed.    __x__ 4. Notify your doctor if there is any change in your medical condition     (cold, fever, infections).     Do not wear jewelry, make-up, hairpins, clips or nail polish.  Do not wear lotions, powders, or perfumes. You may wear deodorant.  Do not shave 48 hours prior to surgery. Men may shave face and neck.  Do not bring valuables to the hospital.    [facility] [place] is not responsible for any belongings or valuables.               Contacts, dentures or bridgework may not be worn into surgery.  Leave your suitcase in the car. After surgery it may be brought to your room.  For patients admitted to the hospital, discharge time is determined by your                treatment team.   Patients discharged the day of surgery will not be allowed to drive home.   Please read over the following fact sheets that you were given:   MRSA Information and Surgical Site Infection Prevention   ____ Take these medicines the morning of surgery with A SIP OF WATER:    1. Lisinopril  2. Omeprazole  3.   4.  5.  6.  ____ Fleet Enema (as directed)   _x___ Use CHG Soap as directed  ____ Use inhalers on the day of surgery  ____ Stop metformin 2 days prior to surgery    ____ Take 1/2 of usual insulin dose the night before surgery and none on the  morning of surgery.   _x___ Stop Coumadin/Plavix/aspirin on (ASK DR. KOWALSKI ABOUT STOPPING PLAVIX AND ASPIRIN)  ____ Stop Anti-inflammatories on    __x __ Stop supplements until after surgery. (STOP FISH OIL AND VITAMIN B-12 NOW)   ____ Bring C-Pap to the hospital.

## 2015-07-04 LAB — ABO/RH: ABO/RH(D): O POS

## 2015-07-05 LAB — URINE CULTURE: CULTURE: NO GROWTH

## 2015-07-08 ENCOUNTER — Encounter: Payer: PPO | Admitting: Physical Therapy

## 2015-07-10 ENCOUNTER — Inpatient Hospital Stay: Payer: PPO | Admitting: Anesthesiology

## 2015-07-10 ENCOUNTER — Inpatient Hospital Stay
Admission: RE | Admit: 2015-07-10 | Discharge: 2015-07-11 | DRG: 483 | Disposition: A | Discharge status: Home Health | Payer: PPO | Source: Ambulatory Visit | Attending: Surgery | Admitting: Surgery

## 2015-07-10 ENCOUNTER — Encounter
Admission: RE | Disposition: A | Discharge status: Home Health | Payer: Self-pay | Source: Ambulatory Visit | Attending: Surgery

## 2015-07-10 ENCOUNTER — Encounter: Payer: Self-pay | Admitting: *Deleted

## 2015-07-10 ENCOUNTER — Inpatient Hospital Stay: Payer: PPO

## 2015-07-10 DIAGNOSIS — I714 Abdominal aortic aneurysm, without rupture: Secondary | ICD-10-CM | POA: Diagnosis present

## 2015-07-10 DIAGNOSIS — Z85118 Personal history of other malignant neoplasm of bronchus and lung: Secondary | ICD-10-CM

## 2015-07-10 DIAGNOSIS — Z7902 Long term (current) use of antithrombotics/antiplatelets: Secondary | ICD-10-CM

## 2015-07-10 DIAGNOSIS — Z96612 Presence of left artificial shoulder joint: Secondary | ICD-10-CM

## 2015-07-10 DIAGNOSIS — Z950 Presence of cardiac pacemaker: Secondary | ICD-10-CM | POA: Diagnosis not present

## 2015-07-10 DIAGNOSIS — M19012 Primary osteoarthritis, left shoulder: Secondary | ICD-10-CM | POA: Diagnosis present

## 2015-07-10 DIAGNOSIS — Z96611 Presence of right artificial shoulder joint: Secondary | ICD-10-CM | POA: Diagnosis not present

## 2015-07-10 DIAGNOSIS — G4730 Sleep apnea, unspecified: Secondary | ICD-10-CM | POA: Diagnosis present

## 2015-07-10 DIAGNOSIS — K219 Gastro-esophageal reflux disease without esophagitis: Secondary | ICD-10-CM | POA: Diagnosis present

## 2015-07-10 DIAGNOSIS — Z96619 Presence of unspecified artificial shoulder joint: Secondary | ICD-10-CM

## 2015-07-10 DIAGNOSIS — I2510 Atherosclerotic heart disease of native coronary artery without angina pectoris: Secondary | ICD-10-CM | POA: Diagnosis present

## 2015-07-10 DIAGNOSIS — I441 Atrioventricular block, second degree: Secondary | ICD-10-CM | POA: Diagnosis present

## 2015-07-10 DIAGNOSIS — Z7982 Long term (current) use of aspirin: Secondary | ICD-10-CM

## 2015-07-10 DIAGNOSIS — F419 Anxiety disorder, unspecified: Secondary | ICD-10-CM | POA: Diagnosis present

## 2015-07-10 DIAGNOSIS — I10 Essential (primary) hypertension: Secondary | ICD-10-CM | POA: Diagnosis present

## 2015-07-10 DIAGNOSIS — E785 Hyperlipidemia, unspecified: Secondary | ICD-10-CM | POA: Diagnosis present

## 2015-07-10 DIAGNOSIS — I739 Peripheral vascular disease, unspecified: Secondary | ICD-10-CM | POA: Diagnosis present

## 2015-07-10 DIAGNOSIS — I495 Sick sinus syndrome: Secondary | ICD-10-CM | POA: Diagnosis present

## 2015-07-10 DIAGNOSIS — D649 Anemia, unspecified: Secondary | ICD-10-CM | POA: Diagnosis present

## 2015-07-10 DIAGNOSIS — Z8546 Personal history of malignant neoplasm of prostate: Secondary | ICD-10-CM | POA: Diagnosis not present

## 2015-07-10 DIAGNOSIS — E780 Pure hypercholesterolemia: Secondary | ICD-10-CM | POA: Diagnosis present

## 2015-07-10 DIAGNOSIS — Z8673 Personal history of transient ischemic attack (TIA), and cerebral infarction without residual deficits: Secondary | ICD-10-CM

## 2015-07-10 DIAGNOSIS — M5412 Radiculopathy, cervical region: Secondary | ICD-10-CM | POA: Diagnosis present

## 2015-07-10 DIAGNOSIS — N261 Atrophy of kidney (terminal): Secondary | ICD-10-CM | POA: Diagnosis present

## 2015-07-10 DIAGNOSIS — Z791 Long term (current) use of non-steroidal anti-inflammatories (NSAID): Secondary | ICD-10-CM

## 2015-07-10 DIAGNOSIS — Z86010 Personal history of colonic polyps: Secondary | ICD-10-CM | POA: Diagnosis not present

## 2015-07-10 DIAGNOSIS — M5136 Other intervertebral disc degeneration, lumbar region: Secondary | ICD-10-CM | POA: Diagnosis present

## 2015-07-10 DIAGNOSIS — Z79899 Other long term (current) drug therapy: Secondary | ICD-10-CM | POA: Diagnosis not present

## 2015-07-10 HISTORY — PX: TOTAL SHOULDER ARTHROPLASTY: SHX126

## 2015-07-10 SURGERY — ARTHROPLASTY, SHOULDER, TOTAL
Anesthesia: General | Laterality: Left

## 2015-07-10 MED ORDER — SODIUM CHLORIDE 0.9 % IV SOLN
INTRAVENOUS | Status: DC
  Administered 2015-07-10: 20:00:00 via INTRAVENOUS

## 2015-07-10 MED ORDER — HYDROMORPHONE HCL 1 MG/ML IJ SOLN
0.5000 mg | INTRAMUSCULAR | Status: DC | PRN
  Administered 2015-07-10 (×2): 0.5 mg via INTRAVENOUS

## 2015-07-10 MED ORDER — HYDROMORPHONE HCL 1 MG/ML IJ SOLN
INTRAMUSCULAR | Status: AC
  Administered 2015-07-10: 0.25 mg via INTRAVENOUS
  Filled 2015-07-10: qty 1

## 2015-07-10 MED ORDER — FENTANYL CITRATE (PF) 100 MCG/2ML IJ SOLN
25.0000 ug | INTRAMUSCULAR | Status: DC | PRN
  Administered 2015-07-10 (×4): 25 ug via INTRAVENOUS

## 2015-07-10 MED ORDER — OCUVITE-LUTEIN PO CAPS
1.0000 | ORAL_CAPSULE | Freq: Two times a day (BID) | ORAL | Status: DC
  Administered 2015-07-10 – 2015-07-11 (×2): 1 via ORAL
  Filled 2015-07-10 (×2): qty 1

## 2015-07-10 MED ORDER — LORATADINE 10 MG PO TABS
10.0000 mg | ORAL_TABLET | Freq: Every day | ORAL | Status: DC
  Administered 2015-07-11: 10 mg via ORAL
  Filled 2015-07-10: qty 1

## 2015-07-10 MED ORDER — ONDANSETRON HCL 4 MG/2ML IJ SOLN
INTRAMUSCULAR | Status: DC | PRN
  Administered 2015-07-10: 4 mg via INTRAVENOUS

## 2015-07-10 MED ORDER — NEOSTIGMINE METHYLSULFATE 10 MG/10ML IV SOLN
INTRAVENOUS | Status: DC | PRN
  Administered 2015-07-10: 3 mg via INTRAVENOUS

## 2015-07-10 MED ORDER — DEXAMETHASONE SODIUM PHOSPHATE 4 MG/ML IJ SOLN
INTRAMUSCULAR | Status: DC | PRN
  Administered 2015-07-10: 10 mg via INTRAVENOUS

## 2015-07-10 MED ORDER — HYDROMORPHONE HCL 1 MG/ML IJ SOLN
0.2500 mg | INTRAMUSCULAR | Status: AC | PRN
  Administered 2015-07-10 (×4): 0.25 mg via INTRAVENOUS

## 2015-07-10 MED ORDER — LACTATED RINGERS IV SOLN
INTRAVENOUS | Status: DC
  Administered 2015-07-10 (×2): via INTRAVENOUS

## 2015-07-10 MED ORDER — FENTANYL CITRATE (PF) 100 MCG/2ML IJ SOLN
INTRAMUSCULAR | Status: AC
  Administered 2015-07-10: 25 ug via INTRAVENOUS
  Filled 2015-07-10: qty 2

## 2015-07-10 MED ORDER — DIPHENHYDRAMINE HCL 12.5 MG/5ML PO ELIX: 12.5000 mg | ORAL_SOLUTION | ORAL | Status: DC | PRN

## 2015-07-10 MED ORDER — ONDANSETRON HCL 4 MG PO TABS: 4.0000 mg | ORAL_TABLET | Freq: Four times a day (QID) | ORAL | Status: DC | PRN

## 2015-07-10 MED ORDER — METOCLOPRAMIDE HCL 5 MG/ML IJ SOLN: 5.0000 mg | Freq: Three times a day (TID) | INTRAMUSCULAR | Status: DC | PRN

## 2015-07-10 MED ORDER — ENOXAPARIN SODIUM 30 MG/0.3ML ~~LOC~~ SOLN
30.0000 mg | Freq: Two times a day (BID) | SUBCUTANEOUS | Status: DC
  Administered 2015-07-11: 30 mg via SUBCUTANEOUS
  Filled 2015-07-10: qty 0.3

## 2015-07-10 MED ORDER — BUPIVACAINE-EPINEPHRINE (PF) 0.5% -1:200000 IJ SOLN
INTRAMUSCULAR | Status: DC | PRN
  Administered 2015-07-10: 30 mL

## 2015-07-10 MED ORDER — KCL IN DEXTROSE-NACL 20-5-0.9 MEQ/L-%-% IV SOLN
INTRAVENOUS | Status: DC
  Filled 2015-07-10 (×2): qty 1000

## 2015-07-10 MED ORDER — BUPIVACAINE LIPOSOME 1.3 % IJ SUSP
INTRAMUSCULAR | Status: AC
  Filled 2015-07-10: qty 20

## 2015-07-10 MED ORDER — HYDROMORPHONE HCL 1 MG/ML IJ SOLN: 0.2500 mg | INTRAMUSCULAR | Status: DC | PRN

## 2015-07-10 MED ORDER — ROCURONIUM BROMIDE 100 MG/10ML IV SOLN
INTRAVENOUS | Status: DC | PRN
  Administered 2015-07-10: 20 mg via INTRAVENOUS
  Administered 2015-07-10: 10 mg via INTRAVENOUS
  Administered 2015-07-10: 5 mg via INTRAVENOUS
  Administered 2015-07-10: 40 mg via INTRAVENOUS

## 2015-07-10 MED ORDER — METOCLOPRAMIDE HCL 10 MG PO TABS: 5.0000 mg | ORAL_TABLET | Freq: Three times a day (TID) | ORAL | Status: DC | PRN

## 2015-07-10 MED ORDER — MIDAZOLAM HCL 2 MG/2ML IJ SOLN
INTRAMUSCULAR | Status: DC | PRN
  Administered 2015-07-10: 2 mg via INTRAVENOUS

## 2015-07-10 MED ORDER — ONDANSETRON HCL 4 MG/2ML IJ SOLN: 4.0000 mg | Freq: Four times a day (QID) | INTRAMUSCULAR | Status: DC | PRN

## 2015-07-10 MED ORDER — MAGNESIUM HYDROXIDE 400 MG/5ML PO SUSP: 30.0000 mL | Freq: Every day | ORAL | Status: DC | PRN

## 2015-07-10 MED ORDER — ONDANSETRON HCL 4 MG/2ML IJ SOLN: 4.0000 mg | Freq: Once | INTRAMUSCULAR | Status: DC | PRN

## 2015-07-10 MED ORDER — FENTANYL CITRATE (PF) 100 MCG/2ML IJ SOLN
INTRAMUSCULAR | Status: DC | PRN
  Administered 2015-07-10: 100 ug via INTRAVENOUS

## 2015-07-10 MED ORDER — FERROUS SULFATE 325 (65 FE) MG PO TABS
325.0000 mg | ORAL_TABLET | Freq: Two times a day (BID) | ORAL | Status: DC
  Administered 2015-07-11: 325 mg via ORAL
  Filled 2015-07-10: qty 1

## 2015-07-10 MED ORDER — HYDROMORPHONE HCL 1 MG/ML IJ SOLN
0.2500 mg | INTRAMUSCULAR | Status: AC
  Administered 2015-07-10 (×8): 0.25 mg via INTRAVENOUS

## 2015-07-10 MED ORDER — BISACODYL 10 MG RE SUPP: 10.0000 mg | Freq: Every day | RECTAL | Status: DC | PRN

## 2015-07-10 MED ORDER — PHENYLEPHRINE HCL 10 MG/ML IJ SOLN
INTRAMUSCULAR | Status: AC
  Filled 2015-07-10: qty 1

## 2015-07-10 MED ORDER — CEFAZOLIN SODIUM-DEXTROSE 2-3 GM-% IV SOLR
2.0000 g | Freq: Four times a day (QID) | INTRAVENOUS | Status: AC
  Administered 2015-07-10 – 2015-07-11 (×3): 2 g via INTRAVENOUS
  Filled 2015-07-10 (×3): qty 50

## 2015-07-10 MED ORDER — NIACIN ER (ANTIHYPERLIPIDEMIC) 500 MG PO TBCR
500.0000 mg | EXTENDED_RELEASE_TABLET | Freq: Every day | ORAL | Status: DC
  Administered 2015-07-11: 500 mg via ORAL
  Filled 2015-07-10 (×2): qty 1

## 2015-07-10 MED ORDER — NEOMYCIN-POLYMYXIN B GU 40-200000 IR SOLN
Status: AC
  Filled 2015-07-10: qty 20

## 2015-07-10 MED ORDER — SODIUM CHLORIDE 0.9 % IJ SOLN
INTRAMUSCULAR | Status: AC
  Filled 2015-07-10: qty 50

## 2015-07-10 MED ORDER — ADULT MULTIVITAMIN W/MINERALS CH
1.0000 | ORAL_TABLET | Freq: Every day | ORAL | Status: DC
  Administered 2015-07-11: 1 via ORAL
  Filled 2015-07-10 (×2): qty 1

## 2015-07-10 MED ORDER — LISINOPRIL 5 MG PO TABS
5.0000 mg | ORAL_TABLET | Freq: Every day | ORAL | Status: DC
  Administered 2015-07-11: 5 mg via ORAL
  Filled 2015-07-10: qty 1

## 2015-07-10 MED ORDER — KETOROLAC TROMETHAMINE 30 MG/ML IJ SOLN
INTRAMUSCULAR | Status: DC | PRN
  Administered 2015-07-10: 30 mg via INTRAVENOUS

## 2015-07-10 MED ORDER — OMEGA-3-ACID ETHYL ESTERS 1 G PO CAPS
1.0000 g | ORAL_CAPSULE | Freq: Every day | ORAL | Status: DC
  Administered 2015-07-11: 1 g via ORAL
  Filled 2015-07-10: qty 1

## 2015-07-10 MED ORDER — BUPIVACAINE-EPINEPHRINE (PF) 0.5% -1:200000 IJ SOLN
INTRAMUSCULAR | Status: AC
  Filled 2015-07-10: qty 30

## 2015-07-10 MED ORDER — GLYCOPYRROLATE 0.2 MG/ML IJ SOLN
INTRAMUSCULAR | Status: DC | PRN
  Administered 2015-07-10: 0.6 mg via INTRAVENOUS

## 2015-07-10 MED ORDER — ATORVASTATIN CALCIUM 20 MG PO TABS
40.0000 mg | ORAL_TABLET | Freq: Every day | ORAL | Status: DC
  Administered 2015-07-10: 40 mg via ORAL
  Filled 2015-07-10 (×2): qty 1

## 2015-07-10 MED ORDER — SODIUM CHLORIDE 0.9 % IV SOLN
10000.0000 ug | INTRAVENOUS | Status: DC | PRN
  Administered 2015-07-10 (×2): .1 ug via INTRAVENOUS

## 2015-07-10 MED ORDER — TRAZODONE HCL 100 MG PO TABS
100.0000 mg | ORAL_TABLET | Freq: Every day | ORAL | Status: DC
  Administered 2015-07-10: 100 mg via ORAL
  Filled 2015-07-10: qty 1

## 2015-07-10 MED ORDER — NEOMYCIN-POLYMYXIN B GU 40-200000 IR SOLN
Status: DC | PRN
  Administered 2015-07-10: 16 mL

## 2015-07-10 MED ORDER — CLOPIDOGREL BISULFATE 75 MG PO TABS
75.0000 mg | ORAL_TABLET | Freq: Every day | ORAL | Status: DC
  Administered 2015-07-11: 75 mg via ORAL
  Filled 2015-07-10: qty 1

## 2015-07-10 MED ORDER — BUPIVACAINE LIPOSOME 1.3 % IJ SUSP
INTRAMUSCULAR | Status: DC | PRN
  Administered 2015-07-10: 20 mL

## 2015-07-10 MED ORDER — TRANEXAMIC ACID 1000 MG/10ML IV SOLN
INTRAVENOUS | Status: AC
  Filled 2015-07-10: qty 10

## 2015-07-10 MED ORDER — HYDROMORPHONE HCL 1 MG/ML IJ SOLN: 0.5000 mg | INTRAMUSCULAR | Status: DC | PRN

## 2015-07-10 MED ORDER — DOCUSATE SODIUM 100 MG PO CAPS
100.0000 mg | ORAL_CAPSULE | Freq: Two times a day (BID) | ORAL | Status: DC
Start: 2015-07-10 — End: 2015-07-11
  Administered 2015-07-10 – 2015-07-11 (×2): 100 mg via ORAL
  Filled 2015-07-10 (×2): qty 1

## 2015-07-10 MED ORDER — VITAMIN B-12 1000 MCG PO TABS
1000.0000 ug | ORAL_TABLET | Freq: Every day | ORAL | Status: DC
  Administered 2015-07-11: 1000 ug via ORAL
  Filled 2015-07-10: qty 1

## 2015-07-10 MED ORDER — OXYCODONE HCL 5 MG PO TABS
5.0000 mg | ORAL_TABLET | ORAL | Status: DC | PRN
  Administered 2015-07-10 – 2015-07-11 (×5): 5 mg via ORAL
  Filled 2015-07-10 (×5): qty 1

## 2015-07-10 MED ORDER — ZOLPIDEM TARTRATE 5 MG PO TABS
5.0000 mg | ORAL_TABLET | Freq: Every day | ORAL | Status: DC
  Administered 2015-07-10: 5 mg via ORAL
  Filled 2015-07-10: qty 1

## 2015-07-10 MED ORDER — CEFAZOLIN SODIUM-DEXTROSE 2-3 GM-% IV SOLR
INTRAVENOUS | Status: AC
  Filled 2015-07-10: qty 50

## 2015-07-10 MED ORDER — PANTOPRAZOLE SODIUM 40 MG PO TBEC
40.0000 mg | DELAYED_RELEASE_TABLET | Freq: Two times a day (BID) | ORAL | Status: DC
  Administered 2015-07-10 – 2015-07-11 (×2): 40 mg via ORAL
  Filled 2015-07-10 (×2): qty 1

## 2015-07-10 MED ORDER — PHENYLEPHRINE HCL 10 MG/ML IJ SOLN
INTRAMUSCULAR | Status: DC | PRN
  Administered 2015-07-10: 200 ug via INTRAVENOUS
  Administered 2015-07-10: 100 ug via INTRAVENOUS

## 2015-07-10 MED ORDER — ZOLPIDEM TARTRATE 10 MG PO TABS: 5.0000 mg | ORAL_TABLET | Freq: Every day | ORAL | Status: DC

## 2015-07-10 MED ORDER — FLEET ENEMA 7-19 GM/118ML RE ENEM: 1.0000 | ENEMA | Freq: Once | RECTAL | Status: DC | PRN

## 2015-07-10 MED ORDER — CEFAZOLIN SODIUM-DEXTROSE 2-3 GM-% IV SOLR
2.0000 g | Freq: Once | INTRAVENOUS | Status: AC
  Administered 2015-07-10: 2 g via INTRAVENOUS

## 2015-07-10 MED ORDER — ACETAMINOPHEN 325 MG PO TABS: 650.0000 mg | ORAL_TABLET | Freq: Four times a day (QID) | ORAL | Status: DC | PRN

## 2015-07-10 MED ORDER — HYDROMORPHONE HCL 1 MG/ML IJ SOLN
INTRAMUSCULAR | Status: AC
  Filled 2015-07-10: qty 1

## 2015-07-10 MED ORDER — TRANEXAMIC ACID 1000 MG/10ML IV SOLN
INTRAVENOUS | Status: DC | PRN
  Administered 2015-07-10: 1000 mg

## 2015-07-10 MED ORDER — LIDOCAINE HCL (CARDIAC) 20 MG/ML IV SOLN
INTRAVENOUS | Status: DC | PRN
  Administered 2015-07-10: 30 mg via INTRAVENOUS

## 2015-07-10 MED ORDER — PROPOFOL 10 MG/ML IV BOLUS
INTRAVENOUS | Status: DC | PRN
  Administered 2015-07-10: 20 mg via INTRAVENOUS
  Administered 2015-07-10: 150 mg via INTRAVENOUS

## 2015-07-10 MED ORDER — DONEPEZIL HCL 5 MG PO TABS
5.0000 mg | ORAL_TABLET | Freq: Every day | ORAL | Status: DC
  Administered 2015-07-10: 5 mg via ORAL
  Filled 2015-07-10: qty 1

## 2015-07-10 MED ORDER — ACETAMINOPHEN 650 MG RE SUPP: 650.0000 mg | Freq: Four times a day (QID) | RECTAL | Status: DC | PRN

## 2015-07-10 MED ORDER — ASPIRIN EC 81 MG PO TBEC
81.0000 mg | DELAYED_RELEASE_TABLET | Freq: Every day | ORAL | Status: DC
  Administered 2015-07-11: 81 mg via ORAL
  Filled 2015-07-10: qty 1

## 2015-07-10 MED ORDER — CALCIUM CARBONATE-VITAMIN D 500-200 MG-UNIT PO TABS
ORAL_TABLET | Freq: Every day | ORAL | Status: DC
  Administered 2015-07-11: 1 via ORAL
  Filled 2015-07-10: qty 1

## 2015-07-10 SURGICAL SUPPLY — 68 items
BAG DECANTER FOR FLEXI CONT (MISCELLANEOUS) ×2 IMPLANT
BLADE SAGITTAL WIDE XTHICK NO (BLADE) ×2 IMPLANT
BONE CEMENT PALACOSE (Orthopedic Implant) ×2 IMPLANT
BOWL CEMENT MIX W/ADAPTER (MISCELLANEOUS) ×2 IMPLANT
CANISTER SUCT 1200ML W/VALVE (MISCELLANEOUS) ×2 IMPLANT
CANISTER SUCT 3000ML PPV (MISCELLANEOUS) ×4 IMPLANT
CAPT SHLDR TOTAL 2 ×2 IMPLANT
CATH TRAY METER 16FR LF (MISCELLANEOUS) ×2 IMPLANT
CEMENT BONE PALACOSE (Orthopedic Implant) ×1 IMPLANT
CHLORAPREP W/TINT 26ML (MISCELLANEOUS) ×4 IMPLANT
DRAPE FLUOR MINI C-ARM 54X84 (DRAPES) ×2 IMPLANT
DRAPE IMP U-DRAPE 54X76 (DRAPES) ×4 IMPLANT
DRAPE INCISE IOBAN 66X45 STRL (DRAPES) ×4 IMPLANT
DRAPE INCISE IOBAN 66X60 STRL (DRAPES) ×2 IMPLANT
DRAPE SHEET LG 3/4 BI-LAMINATE (DRAPES) ×4 IMPLANT
DRAPE TABLE BACK 80X90 (DRAPES) ×2 IMPLANT
DRSG OPSITE POSTOP 4X8 (GAUZE/BANDAGES/DRESSINGS) ×2 IMPLANT
ELECT CAUTERY BLADE 6.4 (BLADE) ×2 IMPLANT
GAUZE PACK 2X3YD (MISCELLANEOUS) ×2 IMPLANT
GLOVE BIO SURGEON STRL SZ7.5 (GLOVE) ×4 IMPLANT
GLOVE BIO SURGEON STRL SZ8 (GLOVE) ×4 IMPLANT
GLOVE BIOGEL PI IND STRL 8 (GLOVE) ×1 IMPLANT
GLOVE BIOGEL PI INDICATOR 8 (GLOVE) ×1
GLOVE INDICATOR 8.0 STRL GRN (GLOVE) ×2 IMPLANT
GOWN STRL REUS W/ TWL LRG LVL3 (GOWN DISPOSABLE) ×2 IMPLANT
GOWN STRL REUS W/ TWL XL LVL3 (GOWN DISPOSABLE) ×1 IMPLANT
GOWN STRL REUS W/TWL LRG LVL3 (GOWN DISPOSABLE) ×2
GOWN STRL REUS W/TWL XL LVL3 (GOWN DISPOSABLE) ×1
HANDPIECE SUCTION TUBG SURGILV (MISCELLANEOUS) ×2 IMPLANT
HOOD PEEL AWAY FACE SHEILD DIS (HOOD) ×6 IMPLANT
KIT STABILIZATION SHOULDER (MISCELLANEOUS) ×2 IMPLANT
MASK FACE SPIDER DISP (MASK) ×2 IMPLANT
NDL MAYO CATGUT SZ1 (NEEDLE) ×2
NDL MAYO CATGUT SZ5 (NEEDLE) ×1
NDL SAFETY 22GX1.5 (NEEDLE) ×2 IMPLANT
NDL SUT 5 .5 CRC TPR PNT MAYO (NEEDLE) ×1 IMPLANT
NEEDLE 18GX1X1/2 (RX/OR ONLY) (NEEDLE) ×2 IMPLANT
NEEDLE HYPO 25X1 1.5 SAFETY (NEEDLE) ×2 IMPLANT
NEEDLE MAYO CATGUT SZ1 (NEEDLE) ×1 IMPLANT
NEEDLE MAYO CATGUT SZ4 (NEEDLE) ×2 IMPLANT
NEEDLE SPNL 20GX3.5 QUINCKE YW (NEEDLE) ×2 IMPLANT
NS IRRIG 1000ML POUR BTL (IV SOLUTION) ×2 IMPLANT
PACK ARTHROSCOPY SHOULDER (MISCELLANEOUS) ×2 IMPLANT
PIN HUMERAL STMN 3.2MMX9IN (INSTRUMENTS) ×2 IMPLANT
SLING ULTRA II M (MISCELLANEOUS) ×2 IMPLANT
SOL .9 NS 3000ML IRR  AL (IV SOLUTION) ×1
SOL .9 NS 3000ML IRR UROMATIC (IV SOLUTION) ×1 IMPLANT
SPONGE LAP 18X18 5 PK (GAUZE/BANDAGES/DRESSINGS) ×2 IMPLANT
STAPLER SKIN PROX 35W (STAPLE) ×2 IMPLANT
STEM MINI LONG 14MM 83MM (Stem) IMPLANT
STRAP SAFETY BODY (MISCELLANEOUS) ×2 IMPLANT
SUT ETHIBOND #5 BRAIDED 30INL (SUTURE) ×6 IMPLANT
SUT ETHIBOND 0 MO6 C/R (SUTURE) ×2 IMPLANT
SUT ETHIBOND NAB CT1 #1 30IN (SUTURE) ×2 IMPLANT
SUT FIBERWIRE #2 38 BLUE 1/2 (SUTURE) ×2
SUT FIBERWIRE #2 38 T-5 BLUE (SUTURE) ×2
SUT STEEL 5 (SUTURE) IMPLANT
SUT TICRON 2-0 30IN 311381 (SUTURE) ×10 IMPLANT
SUT VIC AB 0 CT1 36 (SUTURE) ×4 IMPLANT
SUT VIC AB 2-0 CT1 27 (SUTURE) ×2
SUT VIC AB 2-0 CT1 TAPERPNT 27 (SUTURE) ×2 IMPLANT
SUTURE FIBERWR #2 38 BLUE 1/2 (SUTURE) ×1 IMPLANT
SUTURE FIBERWR #2 38 T-5 BLUE (SUTURE) ×1 IMPLANT
SYR 30ML LL (SYRINGE) ×4 IMPLANT
SYR TB 1ML LUER SLIP (SYRINGE) ×2 IMPLANT
SYRINGE 10CC LL (SYRINGE) ×2 IMPLANT
SYRINGE IRR TOOMEY STRL 70CC (SYRINGE) ×2 IMPLANT
TUBE CONNECTING 6X3/16 (MISCELLANEOUS) ×2 IMPLANT

## 2015-07-10 NOTE — H&P (Signed)
Paper H&P to be scanned into permanent record. H&P reviewed. No changes. 

## 2015-07-10 NOTE — Progress Notes (Signed)
ANTICOAGULATION CONSULT NOTE - Initial Consult  Pharmacy Consult for Lovenox Indication: DVT  No Known Allergies  Patient Measurements: Height: '5\' 11"'$  (180.3 cm) Weight: 200 lb (90.719 kg) IBW/kg (Calculated) : 75.3 Heparin Dosing Weight:   Vital Signs: Temp: 98.6 F (37 C) (09/01 1711) Temp Source: Oral (09/01 1711) BP: 117/69 mmHg (09/01 1711) Pulse Rate: 88 (09/01 1711)  Labs: No results for input(s): HGB, HCT, PLT, APTT, LABPROT, INR, HEPARINUNFRC, CREATININE, CKTOTAL, CKMB, TROPONINI in the last 72 hours.  Estimated Creatinine Clearance: 43.4 mL/min (by C-G formula based on Cr of 1.72).   Medical History: Past Medical History  Diagnosis Date  . Heart disease   . GERD (gastroesophageal reflux disease)   . Hyperlipidemia   . Sleep apnea   . Hypertension   . Neuralgia   . Hypercholesteremia   . Sinoatrial node dysfunction   . DDD (degenerative disc disease), lumbar   . Headache   . Meralgia paresthetica   . Atrophic kidney   . Osteoarthritis   . Anxiety   . Anemia   . Iliac aneurysm   . Iliac aneurysm   . Second degree AV block   . Prostate cancer   . Lung cancer   . AAA (abdominal aortic aneurysm)   . Lung cancer   . TIA (transient ischemic attack)   . Cervical radiculopathy   . Stroke   . Coronary artery disease   . Presence of permanent cardiac pacemaker   . AAA (abdominal aortic aneurysm) without rupture     Medications:  Prescriptions prior to admission  Medication Sig Dispense Refill Last Dose  . aspirin EC 81 MG tablet Take 81 mg by mouth daily.    Past Week at Unknown time  . atorvastatin (LIPITOR) 40 MG tablet Take 40 mg by mouth daily at 6 PM.    07/09/2015 at Unknown time  . Calcium Carb-Cholecalciferol (CALCIUM 600 + D PO) Take 1 tablet by mouth daily.   07/09/2015 at Unknown time  . cetirizine (ZYRTEC) 10 MG tablet Take 10 mg by mouth daily.   Past Month at Unknown time  . clopidogrel (PLAVIX) 75 MG tablet Take 75 mg by mouth daily.     08/01/2014  . cyanocobalamin 1000 MCG tablet Take 100 mcg by mouth daily.     Marland Kitchen donepezil (ARICEPT) 5 MG tablet Take 5 mg by mouth at bedtime.   07/09/2015 at Unknown time  . ferrous sulfate (SLOW FE) 160 (50 FE) MG TBCR SR tablet Take 1 tablet by mouth 2 (two) times daily.    Past Week at Unknown time  . lisinopril (PRINIVIL,ZESTRIL) 5 MG tablet Take 5 mg by mouth daily.    07/10/2015 at Unknown time  . Multiple Vitamins-Minerals (CENTRUM SILVER ULTRA MENS) TABS Take 1 tablet by mouth daily.    Taking  . Multiple Vitamins-Minerals (PRESERVISION/LUTEIN) CAPS Take 1 capsule by mouth 2 (two) times daily.    Past Week at Unknown time  . niacin (NIASPAN) 500 MG CR tablet Take 500 mg by mouth daily.    Taking  . Omega-3 Fatty Acids (FISH OIL) 1200 MG CAPS Take 2 capsules by mouth 2 (two) times daily.    Past Week at Unknown time  . omeprazole (PRILOSEC) 20 MG capsule Take 20 mg by mouth daily.    07/09/2015 at Unknown time  . traMADol-acetaminophen (ULTRACET) 37.5-325 MG per tablet 1 tablet every 6 (six) hours as needed for moderate pain or severe pain.    07/09/2015 at Unknown time  .  traZODone (DESYREL) 100 MG tablet Take 100 mg by mouth at bedtime.    07/09/2015 at Unknown time  . vitamin B-12 (CYANOCOBALAMIN) 1000 MCG tablet Take 1,000 mcg by mouth daily.   Past Week at Unknown time  . zolpidem (AMBIEN) 10 MG tablet Take 10 mg by mouth at bedtime.    07/09/2015 at Unknown time    Assessment: S/P total shoulder arthroplasty CrCl = 43.4 ml/min  Goal of Therapy:  DVT prophylaxis   Plan:  Lovenox 40 mg SQ Q24H originally ordered.   Will adjust dose to Lovenox 30 mg SQ Q12H to start 9/2 @ 8:00.  Tarry Fountain D 07/10/2015,5:38 PM

## 2015-07-10 NOTE — Transfer of Care (Signed)
Immediate Anesthesia Transfer of Care Note  Patient: Rick Mcbride.  Procedure(s) Performed: Procedure(s): TOTAL SHOULDER ARTHROPLASTY (Left)  Patient Location: PACU  Anesthesia Type:General  Level of Consciousness: awake, oriented and patient cooperative  Airway & Oxygen Therapy: Patient Spontanous Breathing and Patient connected to face mask oxygen  Post-op Assessment: Report given to RN and Post -op Vital signs reviewed and stable  Post vital signs: Reviewed and stable  Last Vitals:  Filed Vitals:   07/10/15 1453  BP:   Pulse: 80  Temp: 37 C  Resp: 23    Complications: No apparent anesthesia complications

## 2015-07-10 NOTE — Anesthesia Procedure Notes (Signed)
Procedure Name: Intubation Date/Time: 07/10/2015 11:50 AM Performed by: Doreen Salvage Pre-anesthesia Checklist: Patient identified, Patient being monitored, Timeout performed, Emergency Drugs available and Suction available Patient Re-evaluated:Patient Re-evaluated prior to inductionOxygen Delivery Method: Circle system utilized Preoxygenation: Pre-oxygenation with 100% oxygen Intubation Type: IV induction Ventilation: Mask ventilation without difficulty Laryngoscope Size: Mac and 3 Grade View: Grade I Tube type: Oral Tube size: 7.5 mm Number of attempts: 1 Airway Equipment and Method: Stylet Placement Confirmation: ETT inserted through vocal cords under direct vision,  positive ETCO2 and breath sounds checked- equal and bilateral Secured at: 23 cm Tube secured with: Tape Dental Injury: Teeth and Oropharynx as per pre-operative assessment

## 2015-07-10 NOTE — Progress Notes (Signed)
Polar care applied.

## 2015-07-10 NOTE — Op Note (Signed)
07/10/2015  2:41 PM  Patient:   Rick Mcbride.  Pre-Op Diagnosis:   Degenerative joint disease, left shoulder.  Post-Op Diagnosis:   Same.  Procedure:   Left total shoulder arthroplasty.  Surgeon:   Pascal Lux, MD  Assistant:   Cameron Proud, PA-C  Anesthesia:   General endotracheal intubation and anesthesia.  Findings:   As above. The rotator cuff was in satisfactory condition.  Complications:   None  EBL:  150 cc  Fluids:   900 cc crystalloid  UOP:   50 cc  TT:   None  Drains:   None  Closure:   Staples  Implants:   Biomet Comprehensive system with a pressfit #15 mini-humeral stem, a 54 x 24 mm humeral head, and a medium cemented glenoid component with a Regenerex post.  Brief Clinical Note:   The patient is a 74 year old male with a 1+ year history of gradually worsening left shoulder pain. His symptoms have progressed despite medications, activity modification, etc. 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 The latter study also showed no evidence of significant rotator cuff tears. He is status post a prior right shoulder hemiarthroplasty, from which he has done quite well. The patient presents at this time for a left total shoulder arthroplasty.  Procedure:   The patient underwent placement of an interscalene block by the anesthesiologist in the preoperative holding area before he was brought into the operating room and lain in the supine position on the OR table. The patient then underwent general endotracheal intubation and anesthesia before being repositioned in the beach chair position using the beach chair positioner. A Foley catheter was inserted. The left shoulder and upper extremity were prepped with ChloraPrep solution before being draped sterilely. Preoperative antibiotics were administered. A standard anterior approach to the shoulder was  made through an approximately 4-5 inch incision. The incision was carried down through the subcutaneous tissues to expose the deltopectoral fascia. The interval between the deltoid and pectoralis muscles was identified and this plane developed, retracting the cephalic vein laterally with the deltoid muscle. The conjoined tendon was identified. The lateral margin was dissected and the Kolbel self-retraining retractor inserted. The "three sisters" were identified and cauterized. Bursal tissues were removed to improve visualization. The subscapularis tendon was released from its attachment to the lesser tuberosity 1 cm proximal to its insertion and several tagging sutures placed. The inferior capsule was released with care after identifying and protecting the axillary nerve. The proximal humeral cut was made at approximately 30 of retroversion using the extra-medullary guide. The humeral head piece was taken to the back table where it was measured and found to be optimally replicated by a 54 x 24 mm humeral head component.  Attention was directed to the glenoid. The labrum was debrided circumferentially before the center of the glenoid was marked with electrocautery. The small and medium sizers were positioned and it was elected to proceed with a medium glenoid component. The guidewire was drilled into the glenoid neck using the appropriate guide. After verifying its position, the glenoid was lightly reamed with the butterfly reamer before the centralizing Regenerex post reamer was used. The small peripheral peg guide was positioned and each of the three pegs drilled sequentially, leaving the preceding drill bit in place so as to minimize shifting of the guide. The bony surfaces were prepared for cementing by irrigating them thoroughly with bacitracin saline  solution using the jet lavage system, then packing the glenoid with a Neo-Synephrine soaked sponge. Meanwhile, cement was being mixed on the back table. When it  was ready, some cement was injected into each of the three peg holes using a Toomey syringe and additional cement applied to the posterior aspect of the glenoid component. The component was impacted into place and the excess cement was removed using a Soil scientist. Pressure was maintained on the glenoid until the cement hardened.  Attention was directed to the humeral side. The humeral canal was reamed sequentially beginning with the end-cutting reamer then progressing from a 4 mm reamer up to a 14 mm reamer. This provided excellent circumferential chatter. The canal was broached beginning with a #10 broach and progressing to a #14 broach. This was left in place and a trial reduction performed using the 54 x 24 mm trial humeral head. The arm demonstrated excellent range of motion as the hand could be brought across the chest to the opposite shoulder and brought to the top of the patient's head and to the patient's ear. The shoulder remained stable throughout this range of motion, and was stable with abduction and external rotation. The joint was dislocated and the trial components removed. The permanent #14 mini-stem was impacted into place with care taken to maintain the appropriate version. However, this was deemed to be loose, so the #15 mini humeral stem was selected and impacted into place with good stability. Again, care was taken to be sure that it was placed in approximately 30 of retroversion. The permanent 54 x 24 mm humeral head with the standard taper set in the "A" position was put together on the back table and impacted into place. The Morse taper locking mechanism was verified using manual distraction. The shoulder was relocated and again placed through a range of motion with the findings as described above.  The wound was copiously irrigated with bacitracin saline solution using the jet lavage system before a total of 20 cc of Exparel diluted out to 60 cc with normal saline and 30 cc of 0.5%  Sensorcaine with epinephrine was injected into the pericapsular and peri-incisional tissues to help with postoperative analgesia. The subscapularis tendon was reapproximated using #2 FiberWire interrupted sutures. The deltopectoral interval was closed using 2-0 Vicryl interrupted sutures before the subcutaneous tissues also were closed using 2-0 Vicryl interrupted sutures. The skin was closed using staples. Prior to closing the skin, 1 g of transexemic acid in 10 cc of normal saline was injected intra-articularly to help with postoperative bleeding. A sterile occlusive dressing was applied to the wound before the arm was placed into a shoulder immobilizer with an abduction pillow. A polar pack system also was applied to the shoulder. The patient was then transferred back to a hospital bed before being awakened, extubated, and returned to the recovery room in satisfactory condition after tolerating the procedure well.

## 2015-07-10 NOTE — Discharge Instructions (Signed)
Shoulder Joint Replacement, Care After Refer to this sheet in the next few weeks. These instructions provide you with information on caring for yourself after your procedure. Your health care provider may also give you more specific instructions. Your treatment has been planned according to current medical practices, but problems sometimes occur. Call your health care provider if you have any problems or questions after your procedure. WHAT TO EXPECT AFTER THE PROCEDURE After your procedure, your arm and shoulder will typically be stiff and bruised. This will improve over time. HOME CARE INSTRUCTIONS   You may resume your normal diet and activities as directed by your surgeon.  You should regain full use of your shoulder in 6 weeks.  Your arm will be in a sling. You will need to wear this for 4-6 weeks after surgery.  Wear the sling every night for at least the first month, or as instructed by your surgeon.  Do not use your arm to push yourself up in bed or from a chair. This requires too much muscle.  Follow the program of home exercises suggested. Do the exercises 4-5 times a day for a month or as directed.  Try not to overuse your shoulder. Overusing the shoulder is easy to do if this is the first time you have been pain free in a long time. Early overuse of the shoulder may result in later problems.  Do not lift anything heavier than a cup of coffee for the first 6 weeks after surgery.  Ask for help. Your health care provider may be able to suggest a clinic or agency for this if you do not have home support.  Do not participate in contact sports or do any heavy lifting (more than 10 lb [4.5 kg]) for at least 6 months, or as directed.  Apply ice to the injured area for the first 2 days after surgery:  Put ice in a plastic bag.  Place a towel between your skin and the bag.  Leave the ice on for 20 minutes, 2-3 times a day.  Change dressings if necessary or as directed.  Only  take over-the-counter or prescription medicines for pain, discomfort, or fever as directed by your health care provider.  Keep all follow-up appointments as directed. SEEK MEDICAL CARE IF:  You have redness, swelling, or increasing pain in the wound.  You see pus coming from the wound.  You have a fever.  You notice a bad smell coming from the wound or dressing.  The edges of the wound break open after sutures or staples have been removed.  You have increasing pain with movement of the shoulder. SEEK IMMEDIATE MEDICAL CARE IF:   You develop a rash.  You have chest pain or shortness of breath.  You have any reaction or side effects to medicine given. MAKE SURE YOU:  Understand these instructions.  Will watch your condition.  Will get help right away if you are not doing well or get worse. Document Released: 05/14/2005 Document Revised: 10/30/2013 Document Reviewed: 05/24/2013 [facility] Patient Information 2015 [place], [place]. This information is not intended to replace advice given to you by your health care provider. Make sure you discuss any questions you have with your health care provider.

## 2015-07-10 NOTE — Anesthesia Preprocedure Evaluation (Addendum)
Anesthesia Evaluation  Patient identified by MRN, date of birth, ID band Patient awake    Reviewed: Allergy & Precautions, NPO status , Patient's Chart, lab work & pertinent test results  History of Anesthesia Complications (+) PONV  Airway Mallampati: II  TM Distance: >3 FB Neck ROM: Full    Dental  (+) Upper Dentures, Lower Dentures   Pulmonary sleep apnea , former smoker,  breath sounds clear to auscultation Decreased breath sounds of right lung secondary to R lower lobectomy  + decreased breath sounds      Cardiovascular hypertension, + CAD and + Peripheral Vascular Disease Normal cardiovascular exam+ dysrhythmias + pacemaker     Neuro/Psych  Headaches, Anxiety Cervical nerve root disorder, neuralgia... Patient has paresthesias bilaterally on the ulnar aspect of both hands for the last several years TIA Neuromuscular disease CVA    GI/Hepatic GERD-  Medicated and Controlled,Colon polyp   Endo/Other  negative endocrine ROS  Renal/GU Renal diseaseAtrophic kidney  negative genitourinary   Musculoskeletal  (+) Arthritis -, Osteoarthritis,    Abdominal Normal abdominal exam  (+)   Peds negative pediatric ROS (+)  Hematology  (+) anemia ,   Anesthesia Other Findings Prostate CA,  Lung CA,   AAA,  Iliac aneurism  Reproductive/Obstetrics                           Anesthesia Physical Anesthesia Plan  ASA: IV  Anesthesia Plan: General   Post-op Pain Management:    Induction: Intravenous  Airway Management Planned: Oral ETT  Additional Equipment:   Intra-op Plan:   Post-operative Plan: Extubation in OR  Informed Consent: I have reviewed the patients History and Physical, chart, labs and discussed the procedure including the risks, benefits and alternatives for the proposed anesthesia with the patient or authorized representative who has indicated his/her understanding and acceptance.    Dental advisory given  Plan Discussed with: CRNA and Surgeon  Anesthesia Plan Comments: (Discussed with patient that we will not do interscalene block on this patient secondary to his previous lung CA and lobectomy)       Anesthesia Quick Evaluation

## 2015-07-11 ENCOUNTER — Encounter: Payer: Self-pay | Admitting: Surgery

## 2015-07-11 LAB — CBC WITH DIFFERENTIAL/PLATELET
BASOS PCT: 0 %
Basophils Absolute: 0 10*3/uL (ref 0–0.1)
EOS ABS: 0 10*3/uL (ref 0–0.7)
EOS PCT: 0 %
HCT: 38.3 % — ABNORMAL LOW (ref 40.0–52.0)
Hemoglobin: 12.7 g/dL — ABNORMAL LOW (ref 13.0–18.0)
Lymphocytes Relative: 8 %
Lymphs Abs: 0.8 10*3/uL — ABNORMAL LOW (ref 1.0–3.6)
MCH: 30.2 pg (ref 26.0–34.0)
MCHC: 33.1 g/dL (ref 32.0–36.0)
MCV: 91.2 fL (ref 80.0–100.0)
MONO ABS: 0.8 10*3/uL (ref 0.2–1.0)
MONOS PCT: 8 %
Neutro Abs: 9.1 10*3/uL — ABNORMAL HIGH (ref 1.4–6.5)
Neutrophils Relative %: 84 %
PLATELETS: 133 10*3/uL — AB (ref 150–440)
RBC: 4.2 MIL/uL — ABNORMAL LOW (ref 4.40–5.90)
RDW: 13.4 % (ref 11.5–14.5)
WBC: 10.7 10*3/uL — ABNORMAL HIGH (ref 3.8–10.6)

## 2015-07-11 LAB — BASIC METABOLIC PANEL
Anion gap: 6 (ref 5–15)
BUN: 26 mg/dL — ABNORMAL HIGH (ref 6–20)
CALCIUM: 8.9 mg/dL (ref 8.9–10.3)
CO2: 27 mmol/L (ref 22–32)
CREATININE: 1.76 mg/dL — AB (ref 0.61–1.24)
Chloride: 105 mmol/L (ref 101–111)
GFR calc Af Amer: 42 mL/min — ABNORMAL LOW (ref >60)
GFR calc non Af Amer: 36 mL/min — ABNORMAL LOW (ref >60)
Glucose, Bld: 113 mg/dL — ABNORMAL HIGH (ref 65–99)
Potassium: 4.6 mmol/L (ref 3.5–5.1)
Sodium: 138 mmol/L (ref 135–145)

## 2015-07-11 MED ORDER — OXYCODONE HCL 5 MG PO TABS: 5.0000 mg | ORAL_TABLET | ORAL | Status: DC | PRN

## 2015-07-11 NOTE — Progress Notes (Signed)
  Subjective: 1 Day Post-Op Procedure(s) (LRB): TOTAL SHOULDER ARTHROPLASTY (Left) Patient reports pain as mild.   Patient is well, and has had no acute complaints or problems Plan is to go Home after hospital stay. Negative for chest pain and shortness of breath Fever: no Gastrointestinal:Negative for nausea and vomiting  Objective: Vital signs in last 24 hours: Temp:  [97.6 F (36.4 C)-98.6 F (37 C)] 98.1 F (36.7 C) (09/02 0434) Pulse Rate:  [32-140] 63 (09/02 0434) Resp:  [0-27] 18 (09/02 0434) BP: (101-154)/(53-101) 106/57 mmHg (09/02 0434) SpO2:  [96 %-100 %] 97 % (09/02 0434) FiO2 (%):  [28 %] 28 % (09/01 1709) Weight:  [90.719 kg (200 lb)] 90.719 kg (200 lb) (09/01 0927)  Intake/Output from previous day:  Intake/Output Summary (Last 24 hours) at 07/11/15 0800 Last data filed at 07/11/15 9798  Gross per 24 hour  Intake   2297 ml  Output    675 ml  Net   1622 ml    Intake/Output this shift:    Labs:  Recent Labs  07/11/15 0441  HGB 12.7*    Recent Labs  07/11/15 0441  WBC 10.7*  RBC 4.20*  HCT 38.3*  PLT 133*    Recent Labs  07/11/15 0441  NA 138  K 4.6  CL 105  CO2 27  BUN 26*  CREATININE 1.76*  GLUCOSE 113*  CALCIUM 8.9   No results for input(s): LABPT, INR in the last 72 hours.   EXAM General - Patient is Alert, Appropriate and Oriented Extremity - Neurologically intact ABD soft Intact pulses distally Dorsiflexion/Plantar flexion intact Incision: dressing C/D/I No cellulitis present Dressing/Incision - clean, dry, no drainage Motor Function - intact, moving foot and toes well on exam.   Past Medical History  Diagnosis Date  . Heart disease   . GERD (gastroesophageal reflux disease)   . Hyperlipidemia   . Sleep apnea   . Hypertension   . Neuralgia   . Hypercholesteremia   . Sinoatrial node dysfunction   . DDD (degenerative disc disease), lumbar   . Headache   . Meralgia paresthetica   . Atrophic kidney   .  Osteoarthritis   . Anxiety   . Anemia   . Iliac aneurysm   . Iliac aneurysm   . Second degree AV block   . Prostate cancer   . Lung cancer   . AAA (abdominal aortic aneurysm)   . Lung cancer   . TIA (transient ischemic attack)   . Cervical radiculopathy   . Stroke   . Coronary artery disease   . Presence of permanent cardiac pacemaker   . AAA (abdominal aortic aneurysm) without rupture     Assessment/Plan: 1 Day Post-Op Procedure(s) (LRB): TOTAL SHOULDER ARTHROPLASTY (Left) Active Problems:   Status post total shoulder replacement  Estimated body mass index is 27.91 kg/(m^2) as calculated from the following:   Height as of this encounter: '5\' 11"'$  (1.803 m).   Weight as of this encounter: 90.719 kg (200 lb). Advance diet Up with therapy D/C IV fluids   Foley removed today. Pt is doing well with no complaints. Neurovascular intact to the left upper extremity. Plan on discharge home with home health PT today pending urinating, PT session and tolerating PO intake following d/c of IV fluids.  DVT Prophylaxis - Aspirin, Lovenox, Foot Pumps, TED hose and Plavix Non-weightbearing to left upper extremity.  Raquel Elihue Ebert, PA-C [facility] 07/11/2015, 8:00 AM

## 2015-07-11 NOTE — Care Management Note (Signed)
Case Management Note  Patient Details  Name: Rick Mcbride. MRN: [id number] Date of Birth: 12-18-1940  Subjective/Objective:                  Met with patient and his wife to discuss discharge planning. Patient eager to return home today. They has selected [facility] for home health PT. They deny any other RNCM needs.  Action/Plan: Referral called to [place]. No further RNCM needs.   Expected Discharge Date:  07/12/15               Expected Discharge Plan:     In-House Referral:     Discharge planning Services  CM Consult  Post Acute Care Choice:  Home Health Choice offered to:  Patient, Spouse  DME Arranged:  N/A DME Agency:     HH Arranged:  PT [place] Agency:  [place]  Status of Service:  Completed, signed off  Medicare Important Message Given:    Date Medicare IM Given:    Medicare IM give by:    Date Additional Medicare IM Given:    Additional Medicare Important Message give by:     If discussed at [place] of Stay Meetings, dates discussed:    Additional Comments:  Marshell Garfinkel, RN 07/11/2015, 9:40 AM

## 2015-07-11 NOTE — Progress Notes (Signed)
PT DISCHARGED HOME WITH POLAR CARE. I/S ON USE OF POLAR CARE, AND USE OF IMMOBILIZER. VERBALIZED UNDERSTANDING  OF D/C INSTRUCTIONS INCLUDING OXYCODONE ADM. LEFT VIA W/C WITH SPOUSE

## 2015-07-11 NOTE — Discharge Summary (Signed)
Physician Discharge Summary  Patient ID: Rick Mcbride. MRN: [id number] DOB/AGE: 12/12/1940 74 y.o.  Admit date: 07/10/2015 Discharge date: 07/11/2015  Admission Diagnoses:  OSTEOARTHRITIS LEFT SHOULDER Degenerative joint disease, left shoulder  Discharge Diagnoses: Patient Active Problem List   Diagnosis Date Noted  . Status post total shoulder replacement 07/10/2015  . Absolute anemia 04/12/2015  . Atrophic kidney 04/12/2015  . Essential (primary) hypertension 04/12/2015  . Acid reflux 04/12/2015  . Cancer of lung 04/12/2015  . CA of prostate 04/12/2015  . H/O adenomatous polyp of colon 01/15/2015  . Temporary cerebral vascular dysfunction 11/21/2014  . Calcific shoulder tendinitis 08/01/2014  . Cervical nerve root disorder 04/07/2012    Past Medical History  Diagnosis Date  . Heart disease   . GERD (gastroesophageal reflux disease)   . Hyperlipidemia   . Sleep apnea   . Hypertension   . Neuralgia   . Hypercholesteremia   . Sinoatrial node dysfunction   . DDD (degenerative disc disease), lumbar   . Headache   . Meralgia paresthetica   . Atrophic kidney   . Osteoarthritis   . Anxiety   . Anemia   . Iliac aneurysm   . Iliac aneurysm   . Second degree AV block   . Prostate cancer   . Lung cancer   . AAA (abdominal aortic aneurysm)   . Lung cancer   . TIA (transient ischemic attack)   . Cervical radiculopathy   . Stroke   . Coronary artery disease   . Presence of permanent cardiac pacemaker   . AAA (abdominal aortic aneurysm) without rupture      Transfusion: None   Consultants (if any):  None  Discharged Condition: Improved  Hospital Course: [name]. is an 74 y.o. male who was admitted 07/10/2015 with a diagnosis of Degenerative joint disease, left shoulder and went to the operating room on 07/10/2015 and underwent the above named procedures.    Surgeries: Procedure(s): TOTAL SHOULDER ARTHROPLASTY on 07/10/2015 Patient tolerated the  surgery well. Taken to PACU where she was stabilized and then transferred to the orthopedic floor.  Started on Lovenox 30 mg q 12 hrs. Foot pumps applied bilaterally at 80 mm. Heels elevated on bed with rolled towels. No evidence of DVT. Negative Homan. Physical therapy started on day #1 for gait training and transfer. OT started day #1 for ADL and assisted devices. Pt also taking Plavix 75 mg daily and ASA 81 mg daily.  Patients IV and Foley were d/c on POD1  Implants: [employer] Comprehensive system with a pressfit #15 mini-humeral stem, a 54 x 24 mm humeral head, and a medium cemented glenoid component with a Regenerex post.  He was given perioperative antibiotics:  Anti-infectives    Start     Dose/Rate Route Frequency Ordered Stop   07/10/15 1800  ceFAZolin (ANCEF) IVPB 2 g/50 mL premix     2 g 100 mL/hr over 30 Minutes Intravenous Every 6 hours 07/10/15 1708 07/11/15 0704   07/10/15 0837  ceFAZolin (ANCEF) 2-3 GM-% IVPB SOLR    Comments:  HENRY, LOIS: cabinet override      07/10/15 0837 07/10/15 2044   07/10/15 0330  ceFAZolin (ANCEF) IVPB 2 g/50 mL premix     2 g 100 mL/hr over 30 Minutes Intravenous  Once 07/10/15 0318 07/10/15 1157    .  He was given sequential compression devices, early ambulation, and Lovenox, ASA and Plavix for DVT prophylaxis.  He benefited maximally from the hospital stay  and there were no complications.    Recent vital signs:  Filed Vitals:   07/11/15 0434  BP: 106/57  Pulse: 63  Temp: 98.1 F (36.7 C)  Resp: 18    Recent laboratory studies:  Lab Results  Component Value Date   HGB 12.7* 07/11/2015   HGB 14.2 07/03/2015   HGB 13.6 04/09/2015   Lab Results  Component Value Date   WBC 10.7* 07/11/2015   PLT 133* 07/11/2015   Lab Results  Component Value Date   INR 1.00 07/03/2015   Lab Results  Component Value Date   NA 138 07/11/2015   K 4.6 07/11/2015   CL 105 07/11/2015   CO2 27 07/11/2015   BUN 26* 07/11/2015   CREATININE  1.76* 07/11/2015   GLUCOSE 113* 07/11/2015    Discharge Medications:     Medication List    TAKE these medications        aspirin EC 81 MG tablet  Take 81 mg by mouth daily.     atorvastatin 40 MG tablet  Commonly known as:  LIPITOR  Take 40 mg by mouth daily at 6 PM.     CALCIUM 600 + D PO  Take 1 tablet by mouth daily.     CENTRUM SILVER ULTRA MENS Tabs  Take 1 tablet by mouth daily.     PRESERVISION/LUTEIN Caps  Take 1 capsule by mouth 2 (two) times daily.     cetirizine 10 MG tablet  Commonly known as:  ZYRTEC  Take 10 mg by mouth daily.     clopidogrel 75 MG tablet  Commonly known as:  PLAVIX  Take 75 mg by mouth daily.     donepezil 5 MG tablet  Commonly known as:  ARICEPT  Take 5 mg by mouth at bedtime.     ferrous sulfate 160 (50 FE) MG Tbcr SR tablet  Commonly known as:  SLOW FE  Take 1 tablet by mouth 2 (two) times daily.     Fish Oil 1200 MG Caps  Take 2 capsules by mouth 2 (two) times daily.     lisinopril 5 MG tablet  Commonly known as:  PRINIVIL,ZESTRIL  Take 5 mg by mouth daily.     niacin 500 MG CR tablet  Commonly known as:  NIASPAN  Take 500 mg by mouth daily.     omeprazole 20 MG capsule  Commonly known as:  PRILOSEC  Take 20 mg by mouth daily.     oxyCODONE 5 MG immediate release tablet  Commonly known as:  Oxy IR/ROXICODONE  Take 1-2 tablets (5-10 mg total) by mouth every 4 (four) hours as needed for breakthrough pain.     traMADol-acetaminophen 37.5-325 MG per tablet  Commonly known as:  ULTRACET  1 tablet every 6 (six) hours as needed for moderate pain or severe pain.     traZODone 100 MG tablet  Commonly known as:  DESYREL  Take 100 mg by mouth at bedtime.     vitamin B-12 1000 MCG tablet  Commonly known as:  CYANOCOBALAMIN  Take 1,000 mcg by mouth daily.     cyanocobalamin 1000 MCG tablet  Take 100 mcg by mouth daily.     zolpidem 10 MG tablet  Commonly known as:  AMBIEN  Take 10 mg by mouth at bedtime.         Diagnostic Studies: Dg Arthro Shoulder Left  06/23/2015   CLINICAL DATA:  Left shoulder pain.  Limited range of motion.  EXAM: ARTHROGRAM OF  THE LEFT SHOULDER  FLUOROSCOPY TIME:  Radiation Exposure Index (as provided by the fluoroscopic device): 1.2 mGy  COMPARISON:  None.  FINDINGS: The risks and benefits of the procedure were discussed with the patient, and written informed consent was obtained. The patient stated no history of allergy to contrast media. A formal timeout procedure was performed with the patient according to departmental protocol.  The patient was placed supine on the fluoroscopy table and the left glenohumeral joint was identified under fluoroscopy. The skin overlying the left glenohumeral joint was subsequently cleaned with Chloraprep and a sterile drape was placed over the area of interest. 5 ml 1% Lidocaine was used to anesthetize the skin around the needle insertion site.  A 22 gauge spinal needle was inserted into the left glenohumeral joint under fluoroscopy. Position was confirmed with injection of less than 41m of iodinated contrast mixture (7 mL Omnipaque 300 and 20 mL sterile saline) under fluoroscopy.  12 ml of diluted contrast mixture mixture was subsequently injected into the left glenohumeral joint.  The needle was removed and hemostasis was achieved. The patient was subsequently transferred to MRI for imaging.  IMPRESSION: Successful left shoulder arthrogram prior to CT.   Electronically Signed   By: HKathreen Devoid  On: 06/23/2015 11:29   Dg Shoulder Left Port  07/10/2015   CLINICAL DATA:  Status post left shoulder replacement today. Postoperative film.  EXAM: LEFT SHOULDER - 1 VIEW  COMPARISON:  CT left shoulder 06/23/2015.  FINDINGS: Left shoulder arthroplasty is now in place. No fracture or other acute bony or joint abnormality is identified. Mild acromioclavicular degenerative change is seen. Surgical staples are noted.  IMPRESSION: Left shoulder place without evidence  of complication.   Electronically Signed   By: TInge RiseM.D.   On: 07/10/2015 16:15    Disposition: Pt has benefited greatly from his hospital stay.  Pt is stable and ready for discharge today pending urinating without the Foley, d/v of IV fluids and tolerating po intake and PT session.       Follow-up Information    Follow up with JCorky Mull MD In 10 days.   Specialty:  Surgery   Why:  For staple removal, For wound re-check   Contact information:   [phone]       Signed: JJudson RochPA-C 07/11/2015, 8:12 AM

## 2015-07-11 NOTE — Evaluation (Signed)
Physical Therapy Evaluation Patient Details Name: Rick Mcbride. MRN: [id number] DOB: 06-04-1941 Today's Date: 07/11/2015   History of Present Illness  Pt here with L  total shoulder replacement, had R hemi previously so feels relatively comfortable  Clinical Impression  Pt is able to ambulate well w/o AD and after some minimal warm up becomes confident and he and his wife report that he is not too far from his baseline.  He shows good safety and ability to negotiate up/down stairs.      Follow Up Recommendations Home health PT (when able to )    Equipment Recommendations       Recommendations for Other Services       Precautions / Restrictions Precautions Precautions: Fall      Mobility  Bed Mobility Overal bed mobility: Modified Independent             General bed mobility comments: pt does use rails to get to sitting EOB  Transfers Overall transfer level: Independent Equipment used: None             General transfer comment: Pt with some initial limited standing confidence, but once standing a few minutes he feels fine  Ambulation/Gait Ambulation/Gait assistance: Modified independent (Device/Increase time) Ambulation Distance (Feet): 300 Feet Assistive device: None       General Gait Details: Pt with some initial hesitancy and slower gait but ultimately warms up quickly and becomes comfortable and confident with ambualtion getting close to his baseline.   Stairs Stairs: Yes Stairs assistance: Min guard Stair Management:  (simulated wall) Number of Stairs: 4 General stair comments: Pt initially hesitant on first steps, but does do well and shows abillity to go up recioprocally.  No LOBs and generally good safety awareness.   Wheelchair Mobility    Modified Rankin (Stroke Patients Only)       Balance                                             Pertinent Vitals/Pain Pain Assessment: 0-10 Pain Score: 3  Pain Location:  surrounding L shoulder, incision site is "not too bad"    Home Living Family/patient expects to be discharged to:: Private residence Living Arrangements: Spouse/significant other Available Help at Discharge: Family Type of Home: House Home Access: Stairs to enter   [employer] of Steps: 2          Prior Function Level of Independence: Independent         Comments: Pt going to the gym, running errands, etc     Hand Dominance        Extremity/Trunk Assessment   Upper Extremity Assessment: LUE deficits/detail       LUE Deficits / Details: R shoulder with some limitations from previous sx but functional in limited elevation - L shoulder in immobilizer, NT   Lower Extremity Assessment: Overall WFL for tasks assessed         Communication   Communication: No difficulties  Cognition Arousal/Alertness: Suspect due to medications Behavior During Therapy: WFL for tasks assessed/performed                        General Comments      Exercises        Assessment/Plan    PT Assessment    PT Diagnosis Generalized weakness;Difficulty walking  PT Problem List    PT Treatment Interventions     PT Goals (Current goals can be found in the Care Plan section) Acute Rehab PT Goals Patient Stated Goal: "I want to go home ASAP" PT Goal Formulation: With patient/family Time For Goal Achievement: 07/18/15 Potential to Achieve Goals: Good    Frequency     Barriers to discharge        Co-evaluation               End of Session Equipment Utilized During Treatment: Gait belt Activity Tolerance: Patient tolerated treatment well Patient left: with bed alarm set           Time: 3202-3343 PT Time Calculation (min) (ACUTE ONLY): 21 min   Charges:   PT Evaluation $Initial PT Evaluation Tier I: 1 Procedure     PT G Codes:       Rick Mcbride, PT, DPT [phone]  Rick Mcbride 07/11/2015, 10:51 AM

## 2015-07-11 NOTE — Progress Notes (Signed)
Clinical Social Worker (CSW) received SNF consult. PT is recommending home health. RN Case Manager aware of above. Please reconsult if future social work needs arise. CSW signing off.   Ivah Girardot Morgan, LCSWA [phone] 

## 2015-07-15 ENCOUNTER — Encounter: Payer: Self-pay | Admitting: Surgery

## 2015-07-15 LAB — SURGICAL PATHOLOGY

## 2015-07-15 NOTE — Anesthesia Postprocedure Evaluation (Signed)
  Anesthesia Post-op Note  Patient: Rick Mcbride.  Procedure(s) Performed: Procedure(s): TOTAL SHOULDER ARTHROPLASTY (Left)  Anesthesia type:General  Patient location: PACU  Post pain: Pain level controlled  Post assessment: Post-op Vital signs reviewed, Patient's Cardiovascular Status Stable, Respiratory Function Stable, Patent Airway and No signs of Nausea or vomiting  Post vital signs: Reviewed and stable  Last Vitals:  Filed Vitals:   07/11/15 0914  BP: 96/49  Pulse: 73  Temp: 36.8 C  Resp: 18    Level of consciousness: awake, alert  and patient cooperative  Complications: No apparent anesthesia complications

## 2015-07-24 ENCOUNTER — Ambulatory Visit: Payer: PPO | Attending: Internal Medicine | Admitting: Physical Therapy

## 2015-07-24 ENCOUNTER — Encounter: Payer: Self-pay | Admitting: Physical Therapy

## 2015-07-24 DIAGNOSIS — M6281 Muscle weakness (generalized): Secondary | ICD-10-CM

## 2015-07-24 DIAGNOSIS — M25512 Pain in left shoulder: Secondary | ICD-10-CM | POA: Diagnosis present

## 2015-07-25 NOTE — Therapy (Signed)
[facility] [address] [place], [address] Phone: [phone]   Fax:  [phone]  Physical Therapy Evaluation  Patient Details  Name: Rick Mcbride. MRN: [id number] Date of Birth: Nov 25, 1940 Referring Provider:  [name], MD  Encounter Date: 07/24/2015      PT End of Session - 07/24/15 1652    Visit Number 1   Number of Visits 16   Date for PT Re-Evaluation 09/18/15   Authorization Type 1   Authorization Time Period 10   PT Start Time 1600   PT Stop Time 1645   PT Time Calculation (min) 45 min   Activity Tolerance Patient tolerated treatment well   Behavior During Therapy [facility] for tasks assessed/performed      Past Medical History  Diagnosis Date  . Heart disease   . GERD (gastroesophageal reflux disease)   . Hyperlipidemia   . Sleep apnea   . Hypertension   . Neuralgia   . Hypercholesteremia   . Sinoatrial node dysfunction   . DDD (degenerative disc disease), lumbar   . Headache   . Meralgia paresthetica   . Atrophic kidney   . Osteoarthritis   . Anxiety   . Anemia   . Iliac aneurysm   . Iliac aneurysm   . Second degree AV block   . Prostate cancer   . Lung cancer   . AAA (abdominal aortic aneurysm)   . Lung cancer   . TIA (transient ischemic attack)   . Cervical radiculopathy   . Stroke   . Coronary artery disease   . Presence of permanent cardiac pacemaker   . AAA (abdominal aortic aneurysm) without rupture     Past Surgical History  Procedure Laterality Date  . Coronary atherosclerosis of autologous vein bypass graft    . Coronary artery bypass graft    . Knee arthroscopy    . Prostatectomy    . Total shoulder replacement    . Colonoscopy    . Polypectomy    . Pacemaker insertion    . Lobectomy    . Total knee arthroplasty Bilateral   . Cataract extraction    . Joint replacement    . Eye surgery Bilateral     cataract extraction  . Total shoulder  arthroplasty Left 07/10/2015    Procedure: TOTAL SHOULDER ARTHROPLASTY;  Surgeon: [name], MD;  Location: [facility] ORS;  Service: Orthopedics;  Laterality: Left;    There were no vitals filed for this visit.  Visit Diagnosis:  Pain in left shoulder  Muscle weakness      Subjective Assessment - 07/24/15 1619    Subjective Patient reports he is controlling pain with Tramadol and is exercising at home as instructed.    Pertinent History Patient reports history of left shoulder pain for ~ 2 years and has had to wait on treatment due to personal issues that prevented him being able to have treatemnt. He underwent TSA 07/10/2015 and had HHPT x 1 week and is now refered to out patient PT.     Limitations Lifting;House hold activities   Patient Stated Goals Patient would like to decrease pain in left sholder and be able to use left arm with less difficulty and return to using left UE fully without difficulty for personal care and household chores   Currently in Pain? Yes   Pain Score 2    Pain Location Shoulder   Pain Orientation Left   Pain  Descriptors / Indicators Aching   Pain Type Surgical pain   Pain Onset 1 to 4 weeks ago   Multiple Pain Sites No            [facility] PT Assessment - 07/24/15 1648    Assessment   Medical Diagnosis s/p left TSA    Onset Date/Surgical Date 07/10/15   Hand Dominance Right   Next MD Visit 08/2015   Prior Therapy yes, pre surgery out patient PT and post surgery with home health PT   Precautions   Precautions Shoulder   Type of Shoulder Precautions lifting and ROM limitations per protocol, no active motion flexion, rotations x 4 weeks   Shoulder Interventions Shoulder sling/immobilizer   Restrictions   Weight Bearing Restrictions No   Other Position/Activity Restrictions --  no lifting , no active IR   Balance Screen   Has the patient fallen in the past 6 months No   Has the patient had a decrease in activity level because of a fear of falling?  No    Is the patient reluctant to leave their home because of a fear of falling?  No   Home [employer] residence   Living Arrangements Spouse/significant other   Prior Function   Level of [place] Retired     Objective: Observation: patient with sling in place left UE on arrival, well healing incision left shoulder AAROM: left shoulder in supine lying forward elevation to 90 degrees, ER to 10 degrees with arm at side, elbow, wrist and and hand WNL's  Right shoulder 0-150 degrees forward elevation, rotations and abduction WFL's  Treatment:  AAROM: (performed in supine lying position with left UE supported ) left shoulder forward elevation (in scapular plane) to 90 with repetitions guidance of therapist, ER AAROM to neutral degrees at side, scapular control in supine for retraction with tactile cues x 10 reps  Patient response to treatment: improved flexibility with decreased soreness with repetition of forward elevation and scapular retraction, requires assistance to perform all exercises with good shoulder /UE alignment        PT Education - 07/24/15 1651    Education provided Yes   Education Details home exercises and limits for ER/IR and flexion within tolerance (no active motion x 4 weeks), continue with pain control modalities/medication as instructed by MD   Person(s) Educated Patient   Methods Explanation   Comprehension Verbalized understanding             PT Long Term Goals - 07/24/15 1653    PT LONG TERM GOAL #1   Title Patient will demonstrate improvement in functional use of left shoulder/UE with improved QuickDash score of 50% or better by 08/23/2015   Baseline current quick Dash score 73% impairment   Status New   PT LONG TERM GOAL #2   Title Patient will demonstrate independent home exericses and improved control of scapular musculature without cuing by 09/18/2015   Baseline limited to no knowledge of  appropriate exercises to perform to improve strength/scontrol left shoulder s/p TSA   Status New   PT LONG TERM GOAL #3   Title Patient will demonstrate improved function with minimal to no limitations with left UE as demonstrated by quick Dash score of 30% or less by 09/18/2015   Baseline QuickDash score = 73%   Status New   PT LONG TERM GOAL #4   Title Patient will improve AROM left shoulder to 140 degrees flexion or  better by 09/18/2015 for improved overhead motion for dressing upper body, reaching into cabinets, hair care   Baseline AAROM 90 degrees forward elevation   Status New               Plan - August 08, 2015 1700    Clinical Impression Statement Patient is a 74 year old right hand dominant male who presents s/p left TSA 07/10/2015. He is limited in all personal care and household chores and has limitations of ROM and strength left UE. He should respond well to physical therapy intervention in order to progress exercises per protocol s/p TSA and regain full functional use of left UE. He has limited knowledge of apporpoirate progression of exercises to achieve goals.    Pt will benefit from skilled therapeutic intervention in order to improve on the following deficits Decreased strength;Pain;Decreased range of motion;Impaired UE functional use   Rehab Potential Good   Clinical Impairments Affecting Rehab Potential (+) acute condition s/p surgery, motivated, family support    PT Frequency 2x / week   PT Duration 8 weeks   PT Treatment/Interventions Therapeutic exercise;Moist Heat;Patient/family education;Manual techniques   PT Next Visit Plan pain control, progressive strengthening and motor control exercises left UE/shoulder per protocol guidelines   Consulted and Agree with Plan of Care Patient          G-Codes - 08/08/15 1720    Functional Assessment Tool Used QuickDash, pain scale, clinical judgment   Functional Limitation Carrying, moving and handling objects   Carrying,  Moving and Handling Objects Current Status (Q6834) At least 60 percent but less than 80 percent impaired, limited or restricted   Carrying, Moving and Handling Objects Goal Status (H9622) At least 20 percent but less than 40 percent impaired, limited or restricted       Problem List Patient Active Problem List   Diagnosis Date Noted  . Status post total shoulder replacement 07/10/2015  . Absolute anemia 04/12/2015  . Atrophic kidney 04/12/2015  . Essential (primary) hypertension 04/12/2015  . Acid reflux 04/12/2015  . Cancer of lung 04/12/2015  . CA of prostate 04/12/2015  . H/O adenomatous polyp of colon 01/15/2015  . Temporary cerebral vascular dysfunction 11/21/2014  . Calcific shoulder tendinitis 08/01/2014  . Cervical nerve root disorder 04/07/2012    [name] PT 07/25/2015, 3:52 PM  [place] [facility] [address] [place], [address] Phone: [phone]   Fax:  [phone]

## 2015-07-29 ENCOUNTER — Encounter: Payer: Self-pay | Admitting: Physical Therapy

## 2015-07-29 ENCOUNTER — Ambulatory Visit: Payer: PPO | Admitting: Physical Therapy

## 2015-07-29 DIAGNOSIS — M6281 Muscle weakness (generalized): Secondary | ICD-10-CM

## 2015-07-29 DIAGNOSIS — M25512 Pain in left shoulder: Secondary | ICD-10-CM | POA: Diagnosis not present

## 2015-07-29 NOTE — Patient Instructions (Signed)
Patient supine lying: performed assisted exercises for left shoulder PROM forward elevation, ER within

## 2015-07-29 NOTE — Therapy (Signed)
[place] [address] [place], [address] Phone: [phone]   Fax:  [phone]  Physical Therapy Treatment  Patient Details  Name: Rick Mcbride. MRN: [id number] Date of Birth: 05/27/1941 Referring Provider:  [name], MD  Encounter Date: 07/29/2015      PT End of Session - 07/29/15 1651    Visit Number 2   Number of Visits 16   Date for PT Re-Evaluation 09/18/15   Authorization Type 2   Authorization Time Period 10   PT Start Time 1613   PT Stop Time 1640   PT Time Calculation (min) 27 min   Activity Tolerance Patient tolerated treatment well   Behavior During Therapy [facility] for tasks assessed/performed      Past Medical History  Diagnosis Date  . Heart disease   . GERD (gastroesophageal reflux disease)   . Hyperlipidemia   . Sleep apnea   . Hypertension   . Neuralgia   . Hypercholesteremia   . Sinoatrial node dysfunction   . DDD (degenerative disc disease), lumbar   . Headache   . Meralgia paresthetica   . Atrophic kidney   . Osteoarthritis   . Anxiety   . Anemia   . Iliac aneurysm   . Iliac aneurysm   . Second degree AV block   . Prostate cancer   . Lung cancer   . AAA (abdominal aortic aneurysm)   . Lung cancer   . TIA (transient ischemic attack)   . Cervical radiculopathy   . Stroke   . Coronary artery disease   . Presence of permanent cardiac pacemaker   . AAA (abdominal aortic aneurysm) without rupture     Past Surgical History  Procedure Laterality Date  . Coronary atherosclerosis of autologous vein bypass graft    . Coronary artery bypass graft    . Knee arthroscopy    . Prostatectomy    . Total shoulder replacement    . Colonoscopy    . Polypectomy    . Pacemaker insertion    . Lobectomy    . Total knee arthroplasty Bilateral   . Cataract extraction    . Joint replacement    . Eye surgery Bilateral     cataract extraction  . Total shoulder  arthroplasty Left 07/10/2015    Procedure: TOTAL SHOULDER ARTHROPLASTY;  Surgeon: [name], MD;  Location: [facility] ORS;  Service: Orthopedics;  Laterality: Left;    There were no vitals filed for this visit.  Visit Diagnosis:  Pain in left shoulder  Muscle weakness      Subjective Assessment - 07/29/15 1618    Subjective Patient reports he was a little sore following last sessioin. He is exercising at home 1x/day and actively moving elbow to hand more often while sitting in chair.    Patient Stated Goals Patient would like to decrease pain in left sholder and be able to use left arm with less difficulty and return to using left UE fully without difficulty for personal care and household chores   Currently in Pain? Yes   Pain Score 2    Pain Location Shoulder   Pain Orientation Left   Pain Descriptors / Indicators Aching;Sore   Pain Type Surgical pain   Pain Frequency Intermittent   Multiple Pain Sites No      Objective: Wearing sling on arrival, repositioned so it supported elbow better       [facility] Adult PT Treatment/Exercise -  07/29/15 1657    Exercises   Exercises Other Exercises   Other Exercises  supine lying: PROM/AAROM left shoulder forward elevation to 90, ER to 10 degrees in scapular plane 2-3 sets of 5-10 reps to limit of motion within boundaries of protocol, scapular adduction with tactile and verbal cuing 4 sets of 5 reps AAROM/AROM with guidance of therapist       Patient response to treatment: mild discomfort at end range of forward elevation, required verbal/tactile cues to perform  All exercises and to keep within protocol guidelines            PT Education - 07/29/15 1650    Education provided Yes   Education Details instructed to continue with pendulums and pain control with ice, active elbow/wrist/hand exercises   Person(s) Educated Patient   Methods Explanation   Comprehension Verbalized understanding             PT Long Term Goals -  07/24/15 1653    PT LONG TERM GOAL #1   Title Patient will demonstrate improvement in functional use of left shoulder/UE with improved QuickDash score of 50% or better by 08/23/2015   Baseline current quick Dash score 73% impairment   Status New   PT LONG TERM GOAL #2   Title Patient will demonstrate independent home exericses and improved control of scapular musculature without cuing by 09/18/2015   Baseline limited to no knowledge of appropriate exercises to perform to improve strength/scontrol left shoulder s/p TSA   Status New   PT LONG TERM GOAL #3   Title Patient will demonstrate improved funciton with minimal to no limitations with left UE as demonstrated by quick Dash score of 30% or less by 09/18/2015   Baseline QuickDash score = 73%   Status New   PT LONG TERM GOAL #4   Title Patient will improve AROM left shoulder to 140 degrees flexion or better by 09/18/2015 for improved overhead motion for dressing upper body, reaching into cabinets, hair care   Baseline AAROM 90 degrees forward elevation   Status New               Plan - 07/29/15 1652    Clinical Impression Statement Patient progressing well with improving ROM into forward elevation and ER with mild discomfort. Requires assistance for all exercises to keep within protocol guidelines   Pt will benefit from skilled therapeutic intervention in order to improve on the following deficits Decreased strength;Pain;Decreased range of motion;Impaired UE functional use   Rehab Potential Good   PT Frequency 2x / week   PT Duration 8 weeks   PT Treatment/Interventions Therapeutic exercise;Moist Heat;Patient/family education;Manual techniques        Problem List Patient Active Problem List   Diagnosis Date Noted  . Status post total shoulder replacement 07/10/2015  . Absolute anemia 04/12/2015  . Atrophic kidney 04/12/2015  . Essential (primary) hypertension 04/12/2015  . Acid reflux 04/12/2015  . Cancer of lung  04/12/2015  . CA of prostate 04/12/2015  . H/O adenomatous polyp of colon 01/15/2015  . Temporary cerebral vascular dysfunction 11/21/2014  . Calcific shoulder tendinitis 08/01/2014  . Cervical nerve root disorder 04/07/2012    [name] PT 07/29/2015, 7:09 PM  [place] [facility] [address] [place], [address] Phone: [phone]   Fax:  [phone]

## 2015-07-31 ENCOUNTER — Encounter: Payer: Self-pay | Admitting: Physical Therapy

## 2015-07-31 ENCOUNTER — Ambulatory Visit: Payer: PPO | Admitting: Physical Therapy

## 2015-07-31 DIAGNOSIS — M25512 Pain in left shoulder: Secondary | ICD-10-CM

## 2015-07-31 DIAGNOSIS — M6281 Muscle weakness (generalized): Secondary | ICD-10-CM

## 2015-07-31 NOTE — Therapy (Signed)
[place] [facility] [address] [place], [address] Phone: [phone]   Fax:  [phone]  Physical Therapy Treatment  Patient Details  Name: Rick Mcbride. MRN: [id number] Date of Birth: [birth year] Referring Provider:  [name], MD  Encounter Date: 07/31/2015      PT End of Session - 07/31/15 1634    Visit Number 3   Number of Visits 16   Date for PT Re-Evaluation 09/18/15   Authorization Type 3   Authorization Time Period 10   PT Start Time 1603   PT Stop Time 1630   PT Time Calculation (min) 27 min   Activity Tolerance Patient tolerated treatment well   Behavior During Therapy [facility] for tasks assessed/performed      Past Medical History  Diagnosis Date  . Heart disease   . GERD (gastroesophageal reflux disease)   . Hyperlipidemia   . Sleep apnea   . Hypertension   . Neuralgia   . Hypercholesteremia   . Sinoatrial node dysfunction   . DDD (degenerative disc disease), lumbar   . Headache   . Meralgia paresthetica   . Atrophic kidney   . Osteoarthritis   . Anxiety   . Anemia   . Iliac aneurysm   . Iliac aneurysm   . Second degree AV block   . Prostate cancer   . Lung cancer   . AAA (abdominal aortic aneurysm)   . Lung cancer   . TIA (transient ischemic attack)   . Cervical radiculopathy   . Stroke   . Coronary artery disease   . Presence of permanent cardiac pacemaker   . AAA (abdominal aortic aneurysm) without rupture     Past Surgical History  Procedure Laterality Date  . Coronary atherosclerosis of autologous vein bypass graft    . Coronary artery bypass graft    . Knee arthroscopy    . Prostatectomy    . Total shoulder replacement    . Colonoscopy    . Polypectomy    . Pacemaker insertion    . Lobectomy    . Total knee arthroplasty Bilateral   . Cataract extraction    . Joint replacement    . Eye surgery Bilateral     cataract extraction  . Total shoulder  arthroplasty Left 07/10/2015    Procedure: TOTAL SHOULDER ARTHROPLASTY;  Surgeon: [name], MD;  Location: [facility] ORS;  Service: Orthopedics;  Laterality: Left;    There were no vitals filed for this visit.  Visit Diagnosis:  Pain in left shoulder  Muscle weakness      Subjective Assessment - 07/31/15 1604    Subjective Patient reports he has pain in the morning on waking (2/10) and he is controlling pain with medication. He reports he did well following the previous session and did not have any problems. He feels is is able to move more also.    Patient Stated Goals Patient would like to decrease pain in left sholder and be able to use left arm with less difficulty and return to using left UE fully without difficulty for personal care and household chores   Currently in Pain? No/denies   Multiple Pain Sites No        Objective: Observation: patient arrived in clinic with sling in place       [facility] Adult PT Treatment/Exercise - 07/31/15 1607    Exercises   Exercises Other Exercises   Other Exercises  supine lying:  PT performed PROM/AAROM left shoulder forward elevation to 90, ER to 10 degrees in scapular plane 2-3 sets of 5-10 reps to limit of motion within boundaries of protocol, scapular adduction with tactile and verbal cuing 4 sets of 5 reps AAROM/AROM with guidance of therapist      Patient response to treatment: improved AROM forward elevation 90 to 95 degrees, required assistance to raise letf UE due to boundaries and limits of protocol, improved flexibility with each repetition          PT Education - 07/31/15 1630    Education provided Yes   Education Details Patient was instructed in protocol and next progression of exercises and progress made so far   Person(s) Educated Patient   Methods Explanation   Comprehension Verbalized understanding             PT Long Term Goals - 07/24/15 1653    PT LONG TERM GOAL #1   Title Patient will demonstrate  improvement in functional use of left shoulder/UE with improved QuickDash score of 50% or better by 08/23/2015   Baseline current quick Dash score 73% impairment   Status New   PT LONG TERM GOAL #2   Title Patient will demonstrate independent home exericses and improved control of scapular musculature without cuing by 09/18/2015   Baseline limited to no knowledge of appropriate exercises to perform to improve strength/scontrol left shoulder s/p TSA   Status New   PT LONG TERM GOAL #3   Title Patient will demonstrate improved funciton with minimal to no limitations with left UE as demonstrated by quick Dash score of 30% or less by 09/18/2015   Baseline QuickDash score = 73%   Status New   PT LONG TERM GOAL #4   Title Patient will improve AROM left shoulder to 140 degrees flexion or better by 09/18/2015 for improved overhead motion for dressing upper body, reaching into cabinets, hair care   Baseline AAROM 90 degrees forward elevation   Status New               Plan - 07/31/15 1645    Clinical Impression Statement Patient is progressing with increased forward elevation to 95 degrees and decreased end range discomfort. He is aware of precautions and boundaries for using his left arm. He continues to require assistance to perform exercises correctly.    Pt will benefit from skilled therapeutic intervention in order to improve on the following deficits Decreased strength;Pain;Decreased range of motion;Impaired UE functional use   Rehab Potential Good   PT Frequency 2x / week   PT Duration 8 weeks   PT Treatment/Interventions Therapeutic exercise;Moist Heat;Patient/family education;Manual techniques   PT Next Visit Plan pain control, progressive strengthening and motor control exercises left UE/shoulder per protocol guidelines        Problem List Patient Active Problem List   Diagnosis Date Noted  . Status post total shoulder replacement 07/10/2015  . Absolute anemia 04/12/2015   . Atrophic kidney 04/12/2015  . Essential (primary) hypertension 04/12/2015  . Acid reflux 04/12/2015  . Cancer of lung 04/12/2015  . CA of prostate 04/12/2015  . H/O adenomatous polyp of colon 01/15/2015  . Temporary cerebral vascular dysfunction 11/21/2014  . Calcific shoulder tendinitis 08/01/2014  . Cervical nerve root disorder 04/07/2012    [name] PT 07/31/2015, 10:30 PM  [place] [facility] [address], [place], [address] Phone: [phone]   Fax:  [phone]

## 2015-08-04 ENCOUNTER — Encounter: Payer: Self-pay | Admitting: Physical Therapy

## 2015-08-04 ENCOUNTER — Ambulatory Visit: Payer: PPO | Admitting: Physical Therapy

## 2015-08-04 DIAGNOSIS — M25512 Pain in left shoulder: Secondary | ICD-10-CM

## 2015-08-04 DIAGNOSIS — M6281 Muscle weakness (generalized): Secondary | ICD-10-CM

## 2015-08-04 NOTE — Therapy (Signed)
[place] [facility] [address] [place], [address] Phone: [phone]   Fax:  [phone]  Physical Therapy Treatment  Patient Details  Name: Rick Mcbride. MRN: [id number] Date of Birth: 12-Feb-1941 Referring Zarek Relph:  [name], MD  Encounter Date: 08/04/2015      PT End of Session - 08/04/15 1700    Visit Number 4   Number of Visits 16   Date for PT Re-Evaluation 09/18/15   Authorization Type 4   Authorization Time Period 10   PT Start Time 1614   PT Stop Time 1645   PT Time Calculation (min) 31 min   Activity Tolerance Patient tolerated treatment well   Behavior During Therapy [facility] for tasks assessed/performed      Past Medical History  Diagnosis Date  . Heart disease   . GERD (gastroesophageal reflux disease)   . Hyperlipidemia   . Sleep apnea   . Hypertension   . Neuralgia   . Hypercholesteremia   . Sinoatrial node dysfunction   . DDD (degenerative disc disease), lumbar   . Headache   . Meralgia paresthetica   . Atrophic kidney   . Osteoarthritis   . Anxiety   . Anemia   . Iliac aneurysm   . Iliac aneurysm   . Second degree AV block   . Prostate cancer   . Lung cancer   . AAA (abdominal aortic aneurysm)   . Lung cancer   . TIA (transient ischemic attack)   . Cervical radiculopathy   . Stroke   . Coronary artery disease   . Presence of permanent cardiac pacemaker   . AAA (abdominal aortic aneurysm) without rupture     Past Surgical History  Procedure Laterality Date  . Coronary atherosclerosis of autologous vein bypass graft    . Coronary artery bypass graft    . Knee arthroscopy    . Prostatectomy    . Total shoulder replacement    . Colonoscopy    . Polypectomy    . Pacemaker insertion    . Lobectomy    . Total knee arthroplasty Bilateral   . Cataract extraction    . Joint replacement    . Eye surgery Bilateral     cataract extraction  . Total shoulder  arthroplasty Left 07/10/2015    Procedure: TOTAL SHOULDER ARTHROPLASTY;  Surgeon: [name], MD;  Location: [facility] ORS;  Service: Orthopedics;  Laterality: Left;    There were no vitals filed for this visit.  Visit Diagnosis:  Pain in left shoulder  Muscle weakness      Subjective Assessment - 08/04/15 1616    Subjective Patient reports he has pain in the morning on waking (2/10) and he is controlling pain with medication. He reports he did well following the previous session and did not have any problems. He feels is is able to move more also.    Patient Stated Goals Patient would like to decrease pain in left sholder and be able to use left arm with less difficulty and return to using left UE fully without difficulty for personal care and household chores   Currently in Pain? No/denies           Objective: Observation: sling in place left UE on arrival to PT cllinic, left shoulder with well healing incision, mild swelling in shoulder present       [facility] Adult PT Treatment/Exercise - 08/04/15 1618    Exercises  Exercises Other Exercises   Other Exercises  supine lyingsupine lying: PT performed PROM/AAROM left shoulder forward elevation to 100/110, ER 10-30 degrees in scapular plane 2-3 sets of 5-10 reps to limit of motion within boundaries of protocol, scapular adduction with tactile and verbal cuing 3 sets of 10 reps AAROM/AROM with guidance of therapist        Patient response to treatment: improved AAROM forward elevation 90 to 110 degrees, required assistance to raise letf UE due to boundaries and limits of protocol, improved flexibility with each repetition, improving scapular control noted with assistance and repetition, no increased pain in shoulder reported following session         PT Education - 08/04/15 1630    Education Details discussed progress and protocol and next visit exercises and goal for ROM as outlined in protocol   Person(s) Educated Patient    Methods Explanation   Comprehension Verbalized understanding             PT Long Term Goals - 07/24/15 1653    PT LONG TERM GOAL #1   Title Patient will demonstrate improvement in functional use of left shoulder/UE with improved QuickDash score of 50% or better by 08/23/2015   Baseline current quick Dash score 73% impairment   Status New   PT LONG TERM GOAL #2   Title Patient will demonstrate independent home exericses and improved control of scapular musculature without cuing by 09/18/2015   Baseline limited to no knowledge of appropriate exercises to perform to improve strength/scontrol left shoulder s/p TSA   Status New   PT LONG TERM GOAL #3   Title Patient will demonstrate improved funciton with minimal to no limitations with left UE as demonstrated by quick Dash score of 30% or less by 09/18/2015   Baseline QuickDash score = 73%   Status New   PT LONG TERM GOAL #4   Title Patient will improve AROM left shoulder to 140 degrees flexion or better by 09/18/2015 for improved overhead motion for dressing upper body, reaching into cabinets, hair care   Baseline AAROM 90 degrees forward elevation   Status New               Plan - 08/04/15 1700    Clinical Impression Statement Improved ROM to 110 degrees forward elevation, progressing with AAROM left UE with mild discomfort at end ranges of motion. Overall is progressing well through protocol s/p TSA.    Pt will benefit from skilled therapeutic intervention in order to improve on the following deficits Decreased strength;Pain;Decreased range of motion;Impaired UE functional use   Rehab Potential Good   PT Frequency 2x / week   PT Duration 8 weeks   PT Treatment/Interventions Therapeutic exercise;Moist Heat;Patient/family education;Manual techniques   PT Next Visit Plan pain control, progressive strengthening and motor control exercises left UE/shoulder per protocol guidelines        Problem List Patient Active Problem  List   Diagnosis Date Noted  . Status post total shoulder replacement 07/10/2015  . Absolute anemia 04/12/2015  . Atrophic kidney 04/12/2015  . Essential (primary) hypertension 04/12/2015  . Acid reflux 04/12/2015  . Cancer of lung 04/12/2015  . CA of prostate 04/12/2015  . H/O adenomatous polyp of colon 01/15/2015  . Temporary cerebral vascular dysfunction 11/21/2014  . Calcific shoulder tendinitis 08/01/2014  . Cervical nerve root disorder 04/07/2012    [name] PT 08/05/2015, 5:26 PM  [place] [facility] [address]  [place], [place], [address] Phone: [phone]   Fax:  [phone]

## 2015-08-06 ENCOUNTER — Encounter: Payer: Self-pay | Admitting: Physical Therapy

## 2015-08-06 ENCOUNTER — Ambulatory Visit: Payer: PPO | Admitting: Physical Therapy

## 2015-08-06 DIAGNOSIS — M25512 Pain in left shoulder: Secondary | ICD-10-CM | POA: Diagnosis not present

## 2015-08-06 DIAGNOSIS — M6281 Muscle weakness (generalized): Secondary | ICD-10-CM

## 2015-08-07 NOTE — Therapy (Signed)
[facility] [address] [place], [address] Phone: [phone]   Fax:  [phone]  Physical Therapy Treatment  Patient Details  Name: Rick Mcbride. MRN: [id number] Date of Birth: [birth year] Referring Provider:  [name], MD  Encounter Date: 08/06/2015      PT End of Session - 08/07/15 1313    Visit Number 5   Number of Visits 16   Date for PT Re-Evaluation 09/18/15   Authorization Type 5   Authorization Time Period 10   PT Start Time 1630   PT Stop Time 1700   PT Time Calculation (min) 30 min   Activity Tolerance Patient tolerated treatment well   Behavior During Therapy [facility] for tasks assessed/performed      Past Medical History  Diagnosis Date  . Heart disease   . GERD (gastroesophageal reflux disease)   . Hyperlipidemia   . Sleep apnea   . Hypertension   . Neuralgia   . Hypercholesteremia   . Sinoatrial node dysfunction   . DDD (degenerative disc disease), lumbar   . Headache   . Meralgia paresthetica   . Atrophic kidney   . Osteoarthritis   . Anxiety   . Anemia   . Iliac aneurysm   . Iliac aneurysm   . Second degree AV block   . Prostate cancer   . Lung cancer   . AAA (abdominal aortic aneurysm)   . Lung cancer   . TIA (transient ischemic attack)   . Cervical radiculopathy   . Stroke   . Coronary artery disease   . Presence of permanent cardiac pacemaker   . AAA (abdominal aortic aneurysm) without rupture     Past Surgical History  Procedure Laterality Date  . Coronary atherosclerosis of autologous vein bypass graft    . Coronary artery bypass graft    . Knee arthroscopy    . Prostatectomy    . Total shoulder replacement    . Colonoscopy    . Polypectomy    . Pacemaker insertion    . Lobectomy    . Total knee arthroplasty Bilateral   . Cataract extraction    . Joint replacement    . Eye surgery Bilateral     cataract extraction  . Total shoulder  arthroplasty Left 07/10/2015    Procedure: TOTAL SHOULDER ARTHROPLASTY;  Surgeon: [name], MD;  Location: [facility] ORS;  Service: Orthopedics;  Laterality: Left;    There were no vitals filed for this visit.  Visit Diagnosis:  Pain in left shoulder  Muscle weakness      Subjective Assessment - 08/06/15 1637    Subjective no pain reported since previous session, going without sling in the home.    Patient Stated Goals Patient would like to decrease pain in left sholder and be able to use left arm with less difficulty and return to using left UE fully without difficulty for personal care and household chores   Currently in Pain? Yes   Pain Score 2   has not taken any pain pill today   Pain Location Arm   Pain Orientation Left   Pain Descriptors / Indicators Aching   Pain Type Surgical pain   Pain Onset More than a month ago   Pain Frequency Intermittent        Objective: Observation: patient arrived in clinic with sling in place Palpation: + spasms and decreased soft tissue elasticity along upper trapezius and into cervical  spine bilaterally        [facility] Adult PT Treatment/Exercise - 08/06/15 1638    Exercises   Exercises Other Exercises   Other Exercises  supine lying: PT performed PROM/AAROM left shoulder forward elevation to 100/110, ER 10-30 degrees in scapular plane 2-3 sets of 5-10 reps to limit of motion within boundaries of protocol, scapular adduction with tactile and verbal cuing 3 sets of 10 reps, holding stabilization with contact guard of therapist at 90 degrees elevation and neutral ER/IR 3 sets x 5 reps, 5 second holds, standing AAROM with left UE on ball and cuing of therapist with tactile and verbal cues to keep shoulder back and down and control motion at shoulder: 3 sets x 10 reps AAROM/AROM with guidance of therapist     Manual therapy: STM cervical spine and along upper trapezius with patient supine lying pre (supine lying)  exercise and post  exercise  Patient response to treatment: Patient was able to perform all exercises with only mild discomfort at end range forward elevation and demonstrated decreased spasms and improved soft tissue elasticity in cervical spine and upper trapezius with STM          PT Education - 08/06/15 1700    Education provided Yes   Education Details reviewed protocol and progression of ROM and precautions of no lifting anything heavier than coffee cup   Person(s) Educated Patient   Methods Explanation   Comprehension Verbalized understanding             PT Long Term Goals - 07/24/15 1653    PT LONG TERM GOAL #1   Title Patient will demonstrate improvement in functional use of left shoulder/UE with improved QuickDash score of 50% or better by 08/23/2015   Baseline current quick Dash score 73% impairment   Status New   PT LONG TERM GOAL #2   Title Patient will demonstrate independent home exericses and improved control of scapular musculature without cuing by 09/18/2015   Baseline limited to no knowledge of appropriate exercises to perform to improve strength/scontrol left shoulder s/p TSA   Status New   PT LONG TERM GOAL #3   Title Patient will demonstrate improved funciton with minimal to no limitations with left UE as demonstrated by quick Dash score of 30% or less by 09/18/2015   Baseline QuickDash score = 73%   Status New   PT LONG TERM GOAL #4   Title Patient will improve AROM left shoulder to 140 degrees flexion or better by 09/18/2015 for improved overhead motion for dressing upper body, reaching into cabinets, hair care   Baseline AAROM 90 degrees forward elevation   Status New               Plan - 08/06/15 1700    Clinical Impression Statement Improved AAROM to 110 degrees forward elevation in scapular plane with decreased discomfort at end range. Progressing with protocol.    Pt will benefit from skilled therapeutic intervention in order to improve on the following  deficits Decreased strength;Pain;Decreased range of motion;Impaired UE functional use   Rehab Potential Good   PT Frequency 2x / week   PT Duration 8 weeks   PT Treatment/Interventions Therapeutic exercise;Moist Heat;Patient/family education;Manual techniques   PT Next Visit Plan pain control, progressive strengthening and motor control exercises left UE/shoulder per protocol guidelines        Problem List Patient Active Problem List   Diagnosis Date Noted  . Status post total shoulder replacement 07/10/2015  .  Absolute anemia 04/12/2015  . Atrophic kidney 04/12/2015  . Essential (primary) hypertension 04/12/2015  . Acid reflux 04/12/2015  . Cancer of lung 04/12/2015  . CA of prostate 04/12/2015  . H/O adenomatous polyp of colon 01/15/2015  . Temporary cerebral vascular dysfunction 11/21/2014  . Calcific shoulder tendinitis 08/01/2014  . Cervical nerve root disorder 04/07/2012    [name] PT 08/07/2015, 1:31 PM  [place] [facility] [address] [place], [address] Phone: [phone]   Fax:  [phone]

## 2015-08-11 ENCOUNTER — Ambulatory Visit: Payer: PPO | Attending: Internal Medicine | Admitting: Physical Therapy

## 2015-08-11 ENCOUNTER — Encounter: Payer: Self-pay | Admitting: Physical Therapy

## 2015-08-11 DIAGNOSIS — M25512 Pain in left shoulder: Secondary | ICD-10-CM

## 2015-08-11 DIAGNOSIS — M6281 Muscle weakness (generalized): Secondary | ICD-10-CM | POA: Insufficient documentation

## 2015-08-11 NOTE — Therapy (Signed)
[facility] [address], [place], [address] Phone: [phone]   Fax:  [phone]  Physical Therapy Treatment  Patient Details  Name: Rick Mcbride. MRN: [id number] Date of Birth: 1941-06-17 Referring Provider:  [name], MD  Encounter Date: 08/11/2015      PT End of Session - 08/11/15 1742    Visit Number 6   Number of Visits 16   Date for PT Re-Evaluation 09/18/15   Authorization Type 6   Authorization Time Period 10   PT Start Time 1616   PT Stop Time 1650   PT Time Calculation (min) 34 min   Activity Tolerance Patient tolerated treatment well   Behavior During Therapy [facility] for tasks assessed/performed      Past Medical History  Diagnosis Date  . Heart disease   . GERD (gastroesophageal reflux disease)   . Hyperlipidemia   . Sleep apnea   . Hypertension   . Neuralgia   . Hypercholesteremia   . Sinoatrial node dysfunction (HCC)   . DDD (degenerative disc disease), lumbar   . Headache   . Meralgia paresthetica   . Atrophic kidney   . Osteoarthritis   . Anxiety   . Anemia   . Iliac aneurysm ([place])   . Iliac aneurysm ([place])   . Second degree AV block   . Prostate cancer ([place])   . Lung cancer ([place])   . AAA (abdominal aortic aneurysm) ([place])   . Lung cancer ([place])   . TIA (transient ischemic attack)   . Cervical radiculopathy   . Stroke ([place])   . Coronary artery disease   . Presence of permanent cardiac pacemaker   . AAA (abdominal aortic aneurysm) without rupture [facility])     Past Surgical History  Procedure Laterality Date  . Coronary atherosclerosis of autologous vein bypass graft    . Coronary artery bypass graft    . Knee arthroscopy    . Prostatectomy    . Total shoulder replacement    . Colonoscopy    . Polypectomy    . Pacemaker insertion    . Lobectomy    . Total knee arthroplasty Bilateral   . Cataract extraction    . Joint replacement    . Eye surgery  Bilateral     cataract extraction  . Total shoulder arthroplasty Left 07/10/2015    Procedure: TOTAL SHOULDER ARTHROPLASTY;  Surgeon: [name], MD;  Location: [facility] ORS;  Service: Orthopedics;  Laterality: Left;    There were no vitals filed for this visit.  Visit Diagnosis:  Pain in left shoulder  Muscle weakness      Subjective Assessment - 08/11/15 1618    Subjective Patient reports he is still having pain in left shoulder and he is going without sling at home with sitting and sleeping.    Patient Stated Goals Patient would like to decrease pain in left sholder and be able to use left arm with less difficulty and return to using left UE fully without difficulty for personal care and household chores   Currently in Pain? Yes   Pain Score 2    Pain Location Shoulder   Pain Orientation Left   Pain Descriptors / Indicators Aching   Pain Type Surgical pain   Pain Onset More than a month ago  surgery 07/10/2015   Multiple Pain Sites No       Objective: Observation; patient arrived in clinic with sling in place  left UE at side of trunk Palpation: + spasms along upper trapezius left, and increased crepitus along left scapula initially during scapular adduction        [facility] Adult PT Treatment/Exercise - 08/11/15 1620    Exercises   Exercises Other Exercises   Other Exercises  supine lying lying: PT performed PROM/AAROM left shoulder forward elevation to 100/118, ER 10-30 degrees in scapular plane 2-3 sets of 5-10 reps to limit of motion within boundaries of protocol, scapular adduction with tactile and verbal cuing 3 sets of 10 reps, holding stabilization with contact guard of therapist at 90 degrees elevation and neutral ER/IR 3 sets x 5 reps, 5 second holds, standing AAROM with left UE on ball and cuing of therapist with tactile and verbal cues to keep shoulder back and down and control motion at shoulder: 3 sets x 10 reps AAROM/AROM with guidance of therapist        Manual therapy: STM performed by therapist for superficial release of upper trapezius in conjunction with exercises with patient supine lying: to decrease Trp and muscles tone/spasms to allow decreased hiking of shoulder with elevation of shoulder    Patient response to treatment: improved AAROM forward elevation 100 to 118 degrees, required assistance to raise letf UE due to boundaries and limits of protocol, improved flexibility with each repetition, improving scapular control noted with assistance and repetition,Decreased pain to 0/10 in left shoulder following treatment         PT Education - 08/11/15 1645    Education provided Yes   Education Details educated patient in performing scapular control adduction exerciess prior ot performing forward elevation exercises to avoid incresaed shoulder discomfort/pain   Person(s) Educated Patient   Methods Explanation;Demonstration;Verbal cues;Tactile cues   Comprehension Verbalized understanding;Returned demonstration;Verbal cues required;Tactile cues required             PT Long Term Goals - 07/24/15 1653    PT LONG TERM GOAL #1   Title Patient will demonstrate improvement in functional use of left shoulder/UE with improved QuickDash score of 50% or better by 08/23/2015   Baseline current quick Dash score 73% impairment   Status New   PT LONG TERM GOAL #2   Title Patient will demonstrate independent home exericses and improved control of scapular musculature without cuing by 09/18/2015   Baseline limited to no knowledge of appropriate exercises to perform to improve strength/scontrol left shoulder s/p TSA   Status New   PT LONG TERM GOAL #3   Title Patient will demonstrate improved funciton with minimal to no limitations with left UE as demonstrated by quick Dash score of 30% or less by 09/18/2015   Baseline QuickDash score = 73%   Status New   PT LONG TERM GOAL #4   Title Patient will improve AROM left shoulder to 140  degrees flexion or better by 09/18/2015 for improved overhead motion for dressing upper body, reaching into cabinets, hair care   Baseline AAROM 90 degrees forward elevation   Status New               Plan - 08/11/15 1744    Clinical Impression Statement Patient has achieved 118 degrees of shoulder elevation and is having less soreness in left shoulder. He reported no pain/soreness at end of session today. He is going to contact MD regarding wearing sling to sleep.    Pt will benefit from skilled therapeutic intervention in order to improve on the following deficits Decreased strength;Pain;Decreased range of motion;Impaired UE  functional use   Rehab Potential Good   PT Frequency 2x / week   PT Duration 8 weeks   PT Treatment/Interventions Therapeutic exercise;Moist Heat;Patient/family education;Manual techniques   PT Next Visit Plan pain control, progressive strengthening and motor control exercises left UE/shoulder per protocol guidelines        Problem List Patient Active Problem List   Diagnosis Date Noted  . Status post total shoulder replacement 07/10/2015  . Absolute anemia 04/12/2015  . Atrophic kidney 04/12/2015  . Essential (primary) hypertension 04/12/2015  . Acid reflux 04/12/2015  . Cancer of lung ([place]) 04/12/2015  . CA of prostate ([place]) 04/12/2015  . H/O adenomatous polyp of colon 01/15/2015  . Temporary cerebral vascular dysfunction 11/21/2014  . Calcific shoulder tendinitis 08/01/2014  . Cervical nerve root disorder 04/07/2012    [name] PT 08/11/2015, 5:46 PM  [facility] [address] [place], [address] Phone: [phone]   Fax:  [phone]

## 2015-08-13 ENCOUNTER — Ambulatory Visit: Payer: PPO | Admitting: Physical Therapy

## 2015-08-13 ENCOUNTER — Encounter: Payer: Self-pay | Admitting: Physical Therapy

## 2015-08-13 DIAGNOSIS — M6281 Muscle weakness (generalized): Secondary | ICD-10-CM

## 2015-08-13 DIAGNOSIS — M25512 Pain in left shoulder: Secondary | ICD-10-CM

## 2015-08-14 ENCOUNTER — Encounter: Payer: PPO | Admitting: Physical Therapy

## 2015-08-14 NOTE — Therapy (Signed)
[facility] [address], [place], [address] Phone: [phone]   Fax:  [phone]  Physical Therapy Treatment  Patient Details  Name: Rick Mcbride. MRN: [id number] Date of Birth: 1941/05/23 Referring Provider:  [name], MD  Encounter Date: 08/13/2015      PT End of Session - 08/13/15 1723    Visit Number 7   Number of Visits 16   Date for PT Re-Evaluation 09/18/15   Authorization Type 7   Authorization Time Period 10   PT Start Time 1713   PT Stop Time 1740   PT Time Calculation (min) 27 min   Activity Tolerance Patient tolerated treatment well   Behavior During Therapy [facility] for tasks assessed/performed      Past Medical History  Diagnosis Date  . Heart disease   . GERD (gastroesophageal reflux disease)   . Hyperlipidemia   . Sleep apnea   . Hypertension   . Neuralgia   . Hypercholesteremia   . Sinoatrial node dysfunction (HCC)   . DDD (degenerative disc disease), lumbar   . Headache   . Meralgia paresthetica   . Atrophic kidney   . Osteoarthritis   . Anxiety   . Anemia   . Iliac aneurysm ([place])   . Iliac aneurysm ([place])   . Second degree AV block   . Prostate cancer ([place])   . Lung cancer ([place])   . AAA (abdominal aortic aneurysm) ([place])   . Lung cancer ([place])   . TIA (transient ischemic attack)   . Cervical radiculopathy   . Stroke ([place])   . Coronary artery disease   . Presence of permanent cardiac pacemaker   . AAA (abdominal aortic aneurysm) without rupture [facility])     Past Surgical History  Procedure Laterality Date  . Coronary atherosclerosis of autologous vein bypass graft    . Coronary artery bypass graft    . Knee arthroscopy    . Prostatectomy    . Total shoulder replacement    . Colonoscopy    . Polypectomy    . Pacemaker insertion    . Lobectomy    . Total knee arthroplasty Bilateral   . Cataract extraction    . Joint replacement    . Eye surgery  Bilateral     cataract extraction  . Total shoulder arthroplasty Left 07/10/2015    Procedure: TOTAL SHOULDER ARTHROPLASTY;  Surgeon: [name], MD;  Location: [facility] ORS;  Service: Orthopedics;  Laterality: Left;    There were no vitals filed for this visit.  Visit Diagnosis:  Pain in left shoulder  Muscle weakness      Subjective Assessment - 08/13/15 1721    Subjective Spoke with MD and is ok to sleep without the sling. No problems reported from previous sesion. a little sore in shoulder on arrival. He is still using pain medication for pain control.    Patient Stated Goals Patient would like to decrease pain in left sholder and be able to use left arm with less difficulty and return to using left UE fully without difficulty for personal care and household chores   Currently in Pain? Yes   Pain Score 1   less than 2/10 and sore only   Pain Location Shoulder   Pain Orientation Left   Pain Descriptors / Indicators Aching   Pain Type Surgical pain   Pain Onset Other (comment)  surgical 07/10/2015   Multiple Pain Sites No  Objective: Palpation: left shoulder/upper trapezius with + spasms  Observation: wearing sling on left UE on arrival, Mild hiking of left shoulder noted AAROM: left shoulder in supine lying: forward elevation 118 degrees, ER to 30 abduction to 45 (all in plane of scapula)        [facility] Adult PT Treatment/Exercise - 08/13/15 1723    Exercises   Exercises Other Exercises   Other Exercises  supine lying lying: PT performed PROM/AAROM left shoulder forward elevation to118, ER 10-30 degrees in scapular plane 2-3 sets of 5-10 reps to limit of motion within boundaries of protocol, scapular adduction with tactile and verbal cuing 3 sets of 10 reps, holding stabilization with contact guard of therapist at 90 degrees elevation and neutral ER/IR 3 sets x 5 reps, 5 second holds, standing AAROM with left UE on ball and cuing of therapist with tactile and verbal cues to  keep shoulder back and down and control motion at shoulder: 3 sets x 10 reps AAROM/AROM with guidance of therapist       Manual therapy: STM performed by therapist for superficial release of upper trapezius and lateral pectoral muscle in conjunction with exercises with patient supine lying: to decrease Trp and muscles tone/spasms to allow decreased hiking of shoulder with elevation of shoulder and decreased pain end range    Patient response to treatment: improved AAROM forward elevation to 118 degrees with decreased pain/pull at end range and improved following scapular adduction and pectoral release, required assistance to raise letf UE due to boundaries and limits of protocol, improved flexibility with each repetition, improving scapular control noted with assistance and repetition,Decreased pain to 0/10 in left shoulder following treatment              PT Education - 08/13/15 1735    Education provided Yes   Education Details Re inforced home exercises and precautions for s/p 5-6 weeks post op TSA with verbal cues and demonstration   Person(s) Educated Patient   Methods Explanation;Demonstration;Verbal cues   Comprehension Verbalized understanding;Returned demonstration;Verbal cues required             PT Long Term Goals - 07/24/15 1653    PT LONG TERM GOAL #1   Title Patient will demonstrate improvement in functional use of left shoulder/UE with improved QuickDash score of 50% or better by 08/23/2015   Baseline current quick Dash score 73% impairment   Status New   PT LONG TERM GOAL #2   Title Patient will demonstrate independent home exericses and improved control of scapular musculature without cuing by 09/18/2015   Baseline limited to no knowledge of appropriate exercises to perform to improve strength/scontrol left shoulder s/p TSA   Status New   PT LONG TERM GOAL #3   Title Patient will demonstrate improved funciton with minimal to no limitations with left  UE as demonstrated by quick Dash score of 30% or less by 09/18/2015   Baseline QuickDash score = 73%   Status New   PT LONG TERM GOAL #4   Title Patient will improve AROM left shoulder to 140 degrees flexion or better by 09/18/2015 for improved overhead motion for dressing upper body, reaching into cabinets, hair care   Baseline AAROM 90 degrees forward elevation   Status New               Plan - 08/13/15 1740    Clinical Impression Statement Patient continues to progress with decreasing pain and improving ROM within guidelines of protocol. He is aware  of precautions and is compliant with home program.    Pt will benefit from skilled therapeutic intervention in order to improve on the following deficits Decreased strength;Pain;Decreased range of motion;Impaired UE functional use   Rehab Potential Good   PT Frequency 2x / week   PT Duration 8 weeks   PT Treatment/Interventions Therapeutic exercise;Moist Heat;Patient/family education;Manual techniques   PT Next Visit Plan pain control, progressive strengthening and motor control exercises left UE/shoulder per protocol guidelines        Problem List Patient Active Problem List   Diagnosis Date Noted  . Status post total shoulder replacement 07/10/2015  . Absolute anemia 04/12/2015  . Atrophic kidney 04/12/2015  . Essential (primary) hypertension 04/12/2015  . Acid reflux 04/12/2015  . Cancer of lung ([place]) 04/12/2015  . CA of prostate ([place]) 04/12/2015  . H/O adenomatous polyp of colon 01/15/2015  . Temporary cerebral vascular dysfunction 11/21/2014  . Calcific shoulder tendinitis 08/01/2014  . Cervical nerve root disorder 04/07/2012    [name] PT 08/14/2015, 4:40 PM  [facility] [address], [place], [address] Phone: [phone]   Fax:  [phone]

## 2015-08-18 ENCOUNTER — Ambulatory Visit: Payer: PPO | Admitting: Physical Therapy

## 2015-08-18 ENCOUNTER — Encounter: Payer: Self-pay | Admitting: Physical Therapy

## 2015-08-18 DIAGNOSIS — M6281 Muscle weakness (generalized): Secondary | ICD-10-CM

## 2015-08-18 DIAGNOSIS — M25512 Pain in left shoulder: Secondary | ICD-10-CM

## 2015-08-18 NOTE — Therapy (Signed)
[facility] [address] [place], [address] Phone: [phone]   Fax:  [phone]  Physical Therapy Treatment  Patient Details  Name: Rick Mcbride. MRN: [id number] Date of Birth: 1941-08-28 Referring Provider:  [name], MD  Encounter Date: 08/18/2015      PT End of Session - 08/18/15 1625    Visit Number 8   Number of Visits 16   Date for PT Re-Evaluation 09/18/15   Authorization Type 8   Authorization Time Period 10   PT Start Time 1615   PT Stop Time 1645   PT Time Calculation (min) 30 min   Activity Tolerance Patient tolerated treatment well   Behavior During Therapy [facility] for tasks assessed/performed      Past Medical History  Diagnosis Date  . Heart disease   . GERD (gastroesophageal reflux disease)   . Hyperlipidemia   . Sleep apnea   . Hypertension   . Neuralgia   . Hypercholesteremia   . Sinoatrial node dysfunction (HCC)   . DDD (degenerative disc disease), lumbar   . Headache   . Meralgia paresthetica   . Atrophic kidney   . Osteoarthritis   . Anxiety   . Anemia   . 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 Second degree AV block   . Prostate cancer ([place])   . Lung cancer ([place])   . AAA (abdominal aortic aneurysm) ([place])   . Lung cancer ([place])   . TIA (transient ischemic attack)   . Cervical radiculopathy   . Stroke ([place])   . Coronary artery disease   . Presence of permanent cardiac pacemaker   . AAA (abdominal aortic aneurysm) without rupture [facility])     Past Surgical History  Procedure Laterality Date  . Coronary atherosclerosis of autologous vein bypass graft    . Coronary artery bypass graft    . Knee arthroscopy    . Prostatectomy    . Total shoulder replacement    . Colonoscopy    . Polypectomy    . Pacemaker insertion    . Lobectomy    . Total knee arthroplasty Bilateral   . Cataract extraction    . Joint replacement    . Eye surgery  Bilateral     cataract extraction  . Total shoulder arthroplasty Left 07/10/2015    Procedure: TOTAL SHOULDER ARTHROPLASTY;  Surgeon: [name], MD;  Location: [facility] ORS;  Service: Orthopedics;  Laterality: Left;    There were no vitals filed for this visit.  Visit Diagnosis:  Pain in left shoulder  Muscle weakness      Subjective Assessment - 08/18/15 1624    Subjective Patient reports he is doing well without increased pain.    Patient Stated Goals Patient would like to decrease pain in left sholder and be able to use left arm with less difficulty and return to using left UE fully without difficulty for personal care and household chores   Currently in Pain? No/denies        Objective: Palpation: left shoulder/upper trapezius with + spasms  Observation: wearing sling on left UE on arrival, Mild hiking of left shoulder noted AAROM: left shoulder in supine lying: forward elevation 118 degrees, ER to 30 abduction to 45 (all in plane of scapula)        [facility] Adult PT Treatment/Exercise - 08/18/15 1625    Exercises   Exercises Other Exercises  Other Exercises  supine lying lying: PT performed PROM/AAROM left shoulder forward elevation to125/130, ER 30 degrees in scapular plane 2-3 sets of 5-10 reps to limit of motion within boundaries of protocol, scapular adduction with tactile and verbal cuing 2 sets of 10 reps, holding stabilization with contact guard of therapist at 90 degrees elevation and neutral ER/IR 3 sets x 5 reps, 5 second holds, standing AAROM with left UE on ball and cuing of therapist with tactile and verbal cues to keep shoulder back and down and control motion at shoulder: 3 sets x 10 reps, Instructed patient in AAROM for forward elevation in sitting and supine lying x 5 reps using right UE for assistance and 5 reps only to not fatigue and to perform with proper alignment,  elbow flexion and extension performed with light manual resistance through ROM with patient  supine lying 1 x 10     STM performed along upper trapezius muscles and posterior aspect left shoulder in conjunction with ROM exercises to assist with decreased hiking left shoulder during exercises   Patient response to treatment: decreased hiking left shoulder with sitting/standing exercises with tactile and verbal cuing, improved performance with repetition, patient returned demonstration of home exercises for AAROM with good technique and minimal cuing       PT Long Term Goals - 07/24/15 1653    PT LONG TERM GOAL #1   Title Patient will demonstrate improvement in functional use of left shoulder/UE with improved QuickDash score of 50% or better by 08/23/2015   Baseline current quick Dash score 73% impairment   Status New   PT LONG TERM GOAL #2   Title Patient will demonstrate independent home exericses and improved control of scapular musculature without cuing by 09/18/2015   Baseline limited to no knowledge of appropriate exercises to perform to improve strength/scontrol left shoulder s/p TSA   Status New   PT LONG TERM GOAL #3   Title Patient will demonstrate improved funciton with minimal to no limitations with left UE as demonstrated by quick Dash score of 30% or less by 09/18/2015   Baseline QuickDash score = 73%   Status New   PT LONG TERM GOAL #4   Title Patient will improve AROM left shoulder to 140 degrees flexion or better by 09/18/2015 for improved overhead motion for dressing upper body, reaching into cabinets, hair care   Baseline AAROM 90 degrees forward elevation   Status New               Plan - 08/18/15 1646    Clinical Impression Statement Patient is progressing with incresaed AAROM/PROM left shoulder to 130 degrees forward elevation in supine and able to perform AAROM to 90 in sitting with good shoulder alignment and minimal/no hiking of left shoulder.    Pt will benefit from skilled therapeutic intervention in order to improve on the following  deficits Decreased strength;Pain;Decreased range of motion;Impaired UE functional use   Rehab Potential Good   PT Frequency 2x / week   PT Duration 8 weeks   PT Treatment/Interventions Therapeutic exercise;Moist Heat;Patient/family education;Manual techniques   PT Next Visit Plan pain control, progressive strengthening and motor control exercises left UE/shoulder per protocol guidelines        Problem List Patient Active Problem List   Diagnosis Date Noted  . Status post total shoulder replacement 07/10/2015  . Absolute anemia 04/12/2015  . Atrophic kidney 04/12/2015  . Essential (primary) hypertension 04/12/2015  . Acid reflux 04/12/2015  . Cancer of  lung ([place]) 04/12/2015  . CA of prostate ([place]) 04/12/2015  . H/O adenomatous polyp of colon 01/15/2015  . Temporary cerebral vascular dysfunction 11/21/2014  . Calcific shoulder tendinitis 08/01/2014  . Cervical nerve root disorder 04/07/2012    [name] PT 08/18/2015, 4:58 PM  [facility] [address], [place], [address] Phone: [phone]   Fax:  [phone]

## 2015-08-20 ENCOUNTER — Encounter: Payer: Self-pay | Admitting: Physical Therapy

## 2015-08-20 ENCOUNTER — Ambulatory Visit: Payer: PPO | Admitting: Physical Therapy

## 2015-08-20 DIAGNOSIS — M6281 Muscle weakness (generalized): Secondary | ICD-10-CM

## 2015-08-20 DIAGNOSIS — M25512 Pain in left shoulder: Secondary | ICD-10-CM

## 2015-08-20 NOTE — Therapy (Signed)
[facility] [address] [place], [address] Phone: [phone]   Fax:  [phone]  Physical Therapy Treatment  Patient Details  Name: Rick Mcbride. MRN: [id number] Date of Birth: 01-Mar-1941 Referring Provider:  [name], MD  Encounter Date: 08/20/2015      PT End of Session - 08/20/15 1616    Visit Number 9   Number of Visits 16   Date for PT Re-Evaluation 09/18/15   Authorization Type 9   Authorization Time Period 10   PT Start Time 1613   PT Stop Time 1643   PT Time Calculation (min) 30 min   Activity Tolerance Patient tolerated treatment well   Behavior During Therapy [facility] for tasks assessed/performed      Past Medical History  Diagnosis Date  . Heart disease   . GERD (gastroesophageal reflux disease)   . Hyperlipidemia   . Sleep apnea   . Hypertension   . Neuralgia   . Hypercholesteremia   . Sinoatrial node dysfunction (HCC)   . DDD (degenerative disc disease), lumbar   . Headache   . Meralgia paresthetica   . Atrophic kidney   . Osteoarthritis   . Anxiety   . Anemia   . 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 Second degree AV block   . Prostate cancer ([place])   . Lung cancer ([place])   . AAA (abdominal aortic aneurysm) ([place])   . Lung cancer ([place])   . TIA (transient ischemic attack)   . Cervical radiculopathy   . Stroke ([place])   . Coronary artery disease   . Presence of permanent cardiac pacemaker   . AAA (abdominal aortic aneurysm) without rupture [facility])     Past Surgical History  Procedure Laterality Date  . Coronary atherosclerosis of autologous vein bypass graft    . Coronary artery bypass graft    . Knee arthroscopy    . Prostatectomy    . Total shoulder replacement    . Colonoscopy    . Polypectomy    . Pacemaker insertion    . Lobectomy    . Total knee arthroplasty Bilateral   . Cataract extraction    . Joint replacement    . Eye surgery  Bilateral     cataract extraction  . Total shoulder arthroplasty Left 07/10/2015    Procedure: TOTAL SHOULDER ARTHROPLASTY;  Surgeon: [name], MD;  Location: [facility] ORS;  Service: Orthopedics;  Laterality: Left;    There were no vitals filed for this visit.  Visit Diagnosis:  Pain in left shoulder  Muscle weakness      Subjective Assessment - 08/20/15 1616    Subjective Patient reports he is doing well without increased pain.    Patient Stated Goals Patient would like to decrease pain in left sholder and be able to use left arm with less difficulty and return to using left UE fully without difficulty for personal care and household chores   Currently in Pain? No/denies        Objective: Palpation: left shoulder/upper trapezius with + spasms; decreased as compared to previous session Observation: wearing sling on left UE on arrival, Mild hiking of left shoulder noted AAROM: left shoulder in supine lying: forward elevation 130 degrees, ER to 30 abduction to 45 (all in plane of scapula)          [facility] Adult PT Treatment/Exercise - 08/20/15 1616    Exercises  Exercises Other Exercises   Other Exercises  supine lying lying: PT performed PROM/AAROM left shoulder forward elevation to125/130, ER 30 degrees in scapular plane 2-3 sets of 5-10 reps to limit of motion within boundaries of protocol, scapular adduction with tactile and verbal cuing 2 sets of 10 reps, holding stabilization with contact guard of therapist at 90 degrees elevation and neutral ER/IR 3 sets x 5 reps, 5 second holds, standing AAROM with left UE on ball and cuing of therapist with tactile and verbal cues to keep shoulder back and down and control motion at shoulder: 3 sets x 10 reps, Instructed patient in AAROM for forward elevation in sitting and supine lying x 5 reps using right UE for assistance and 5 reps only to not fatigue and to perform with proper alignment, elbow flexion and extension performed with light  manual resistance through ROM with patient supine lying 1 x 10      STM performed along upper trapezius muscles and posterior aspect left shoulder in conjunction with ROM exercises to assist with decreased hiking left shoulder during exercises   Patient response to treatment: decreased hiking left shoulder with sitting/standing exercises with tactile and verbal cuing, improved performance with repetition, patient returned demonstration of home exercises for AAROM with good technique and minimal cuing, mild discomfort reported at end range flexion in supine lying             PT Education - 08/20/15 1833    Education provided Yes   Education Details re inforced precautions and progression of exercises for week 6-8 s/p TSA   Person(s) Educated Patient   Methods Explanation   Comprehension Verbalized understanding             PT Long Term Goals - 07/24/15 1653    PT LONG TERM GOAL #1   Title Patient will demonstrate improvement in functional use of left shoulder/UE with improved QuickDash score of 50% or better by 08/23/2015   Baseline current quick Dash score 73% impairment   Status New   PT LONG TERM GOAL #2   Title Patient will demonstrate independent home exericses and improved control of scapular musculature without cuing by 09/18/2015   Baseline limited to no knowledge of appropriate exercises to perform to improve strength/scontrol left shoulder s/p TSA   Status New   PT LONG TERM GOAL #3   Title Patient will demonstrate improved funciton with minimal to no limitations with left UE as demonstrated by quick Dash score of 30% or less by 09/18/2015   Baseline QuickDash score = 73%   Status New   PT LONG TERM GOAL #4   Title Patient will improve AROM left shoulder to 140 degrees flexion or better by 09/18/2015 for improved overhead motion for dressing upper body, reaching into cabinets, hair care   Baseline AAROM 90 degrees forward elevation   Status New                Plan - 08/20/15 1617    Clinical Impression Statement Patient is progressing with inreased AAROM left shoulder to 130 degrees forward elevation, 90 abduction, ER to 45 in scapular plane. He is observing precautions for lifting and actively moving left UE and is still in sling if outside the home. He is progressing well through protocol and should continue to progress well with guidance of physical therapy for appropriate exercise progression.   Pt will benefit from skilled therapeutic intervention in order to improve on the following deficits Decreased strength;Pain;Decreased range of  motion;Impaired UE functional use   PT Frequency 2x / week   PT Duration 8 weeks   PT Treatment/Interventions Therapeutic exercise;Moist Heat;Patient/family education;Manual techniques   PT Next Visit Plan pain control, progressive strengthening and motor control exercises left UE/shoulder per protocol guidelines        Problem List Patient Active Problem List   Diagnosis Date Noted  . Status post total shoulder replacement 07/10/2015  . Absolute anemia 04/12/2015  . Atrophic kidney 04/12/2015  . Essential (primary) hypertension 04/12/2015  . Acid reflux 04/12/2015  . Cancer of lung ([place]) 04/12/2015  . CA of prostate ([place]) 04/12/2015  . H/O adenomatous polyp of colon 01/15/2015  . Temporary cerebral vascular dysfunction 11/21/2014  . Calcific shoulder tendinitis 08/01/2014  . Cervical nerve root disorder 04/07/2012    [name] PT 08/21/2015, 9:32 AM  [facility] [address], [place], [address] Phone: [phone]   Fax:  [phone]

## 2015-08-22 DIAGNOSIS — Z9889 Other specified postprocedural states: Secondary | ICD-10-CM | POA: Insufficient documentation

## 2015-08-25 ENCOUNTER — Encounter: Payer: Self-pay | Admitting: Physical Therapy

## 2015-08-25 ENCOUNTER — Ambulatory Visit: Payer: PPO | Admitting: Physical Therapy

## 2015-08-25 DIAGNOSIS — M6281 Muscle weakness (generalized): Secondary | ICD-10-CM

## 2015-08-25 DIAGNOSIS — M25512 Pain in left shoulder: Secondary | ICD-10-CM | POA: Diagnosis not present

## 2015-08-25 NOTE — Therapy (Signed)
[facility] [address], [place], [address] Phone: [phone]   Fax:  [phone]  Physical Therapy Treatment  Patient Details  Name: Rick Mcbride. MRN: [id number] Date of Birth: 03-14-1941  Referring Provider: [name], MD  Encounter Date: 08/25/2015      PT End of Session - 08/25/15 1616    Visit Number 10   Number of Visits 16   Date for PT Re-Evaluation 09/18/15   Authorization Type 10   Authorization Time Period 10   PT Start Time 1611   PT Stop Time 1643   PT Time Calculation (min) 32 min   Activity Tolerance Patient tolerated treatment well   Behavior During Therapy [facility] for tasks assessed/performed      Past Medical History  Diagnosis Date  . Heart disease   . GERD (gastroesophageal reflux disease)   . Hyperlipidemia   . Sleep apnea   . Hypertension   . Neuralgia   . Hypercholesteremia   . Sinoatrial node dysfunction (HCC)   . DDD (degenerative disc disease), lumbar   . Headache   . Meralgia paresthetica   . Atrophic kidney   . Osteoarthritis   . Anxiety   . Anemia   . 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 Second degree AV block   . Prostate cancer ([place])   . Lung cancer ([place])   . AAA (abdominal aortic aneurysm) ([place])   . Lung cancer ([place])   . TIA (transient ischemic attack)   . Cervical radiculopathy   . Stroke ([place])   . Coronary artery disease   . Presence of permanent cardiac pacemaker   . AAA (abdominal aortic aneurysm) without rupture [facility])     Past Surgical History  Procedure Laterality Date  . Coronary atherosclerosis of autologous vein bypass graft    . Coronary artery bypass graft    . Knee arthroscopy    . Prostatectomy    . Total shoulder replacement    . Colonoscopy    . Polypectomy    . Pacemaker insertion    . Lobectomy    . Total knee arthroplasty Bilateral   . Cataract extraction    . Joint replacement    . Eye surgery  Bilateral     cataract extraction  . Total shoulder arthroplasty Left 07/10/2015    Procedure: TOTAL SHOULDER ARTHROPLASTY;  Surgeon: Rick Mull, MD;  Location: [facility] ORS;  Service: Orthopedics;  Laterality: Left;    There were no vitals filed for this visit.  Visit Diagnosis:  Pain in left shoulder  Muscle weakness      Subjective Assessment - 08/25/15 1611    Subjective Patient reports being seen by Dr. Roland Mcbride last week with good report and is conitnue with therapy and wearing sling outside in public.    Limitations Lifting;House hold activities   Patient Stated Goals Patient would like to decrease pain in left sholder and be able to use left arm with less difficulty and return to using left UE fully without difficulty for personal care and household chores   Pain Score --  2/10 aching   Pain Location Shoulder   Pain Orientation Left   Pain Descriptors / Indicators Aching    Pain Type Surgical pain   Pain Onset Other (comment)  surgical 07/10/2015   Pain Relieving Factors rest, medication   Effect of Pain on Daily Activities limited due to surgery and limitation  of protocol   Multiple Pain Sites No      Objective: Palpation: mild tenderness with spasms noted left upper trapezius muscle, decreased as compared to previous session AAROM: left shoulder forward elevation in supine lying: 130 degrees       [facility] Adult PT Treatment/Exercise - 08/25/15 1615    Exercises   Exercises Other Exercises: all performed with guidance, verbal/tactile cueing of physical therapist:    Other Exercises  shoulder forward elevation to125/130, ER 45 degrees in scapular plane 2-3 sets of 5-10 reps to limit of motion within boundaries of protocol, scapular adduction with tactile and verbal cuing 2 sets of 10 reps, holding stabilization with contact guard of therapist at 90 degrees elevation and neutral ER/IR 3 sets x 5 reps, 5 second holds, standing AAROM with left UE on ball and cuing of therapist  with tactile and verbal cues to keep shoulder back and down and control motion at shoulder: 3 sets x 10 reps,  AAROM for forward elevation in sitting and supine lying x 5 reps using right UE for assistance and 5 reps only to not fatigue and to perform with proper alignment, elbow flexion and extension performed with light manual resistance through ROM with patient supine lying 1 x 10, isometric flexion and ER performed in sitting with manual graded resistance of therapist and verbal cues x 10 reps      STM performed to left upper trapezius in conjunction with exercises to decreased spasms and prevent hiking of shoulder with exercises  Patient response to treatment: improved AAROM with decreased pain and improved flexibility noted in left shoulder with exercises and guidance of therapist. He required verbal cuing and tactile cues to perform with correct alignment of shoulder with standing and supine exercises. Good demonstration of exercises performed with repetition             PT Education - 08/25/15 1752    Education provided Yes   Education Details Re inforced home exercises for AAROM in sitting and supine lying    Person(s) Educated Patient   Methods Explanation;Verbal cues   Comprehension Verbalized understanding;Returned demonstration;Verbal cues required             PT Long Term Goals - 08/25/15 1756    PT LONG TERM GOAL #1   Title Patient will demonstrate improvement in functional use of left shoulder/UE with improved QuickDash score of 50% or better by 08/23/2015   Baseline current quick Dash score 73% impairment  (08/25/2015 55%)   Status Partially Met   PT LONG TERM GOAL #2   Title Patient will demonstrate independent home exericses and improved control of scapular musculature without cuing by 09/18/2015   Baseline limited to no knowledge of appropriate exercises to perform to improve strength/scontrol left shoulder s/p TSA   Status On-going   PT LONG TERM GOAL #3    Title Patient will demonstrate improved funciton with minimal to no limitations with left UE as demonstrated by quick Dash score of 30% or less by 09/18/2015   Baseline QuickDash score = 73%   Status On-going   PT LONG TERM GOAL #4   Title Patient will improve AROM left shoulder to 140 degrees flexion or better by 09/18/2015 for improved overhead motion for dressing upper body, reaching into cabinets, hair care   Baseline AAROM 90 degrees forward elevation   Status On-going               Plan - 08/25/15 1753    Clinical  Impression Statement Patient is progressing well towards goals with improving AAROM with decreasing pain. His shoulder AAROM forward elevation is 130, ER 45 degrees abduction 90 degrees. He is observing his precautions for s/p TSA x 6-8 weeks. His current impairment is 50% based on function, pain level, clinical judgment and he is progressing towards less than 20% mprovment by discharge from physical therapy .    Pt will benefit from skilled therapeutic intervention in order to improve on the following deficits Decreased strength;Pain;Decreased range of motion;Impaired UE functional use   Rehab Potential Good   PT Frequency 2x / week   PT Duration 8 weeks   PT Treatment/Interventions Therapeutic exercise;Moist Heat;Patient/family education;Manual techniques   PT Next Visit Plan pain control, progressive strengthening and motor control exercises left UE/shoulder per protocol guidelines          G-Codes - September 02, 2015 1757    Functional Assessment Tool Used QuickDash, pain scale, clinical judgment, ROM and strength deficits   Functional Limitation Carrying, moving and handling objects   Carrying, Moving and Handling Objects Current Status (A6825) At least 40 percent but less than 60 percent impaired, limited or restricted   Carrying, Moving and Handling Objects Goal Status (R4935) At least 20 percent but less than 40 percent impaired, limited or restricted      Problem  List Patient Active Problem List   Diagnosis Date Noted  . Status post total shoulder replacement 07/10/2015  . Absolute anemia 04/12/2015  . Atrophic kidney 04/12/2015  . Essential (primary) hypertension 04/12/2015  . Acid reflux 04/12/2015  . Cancer of lung ([place]) 04/12/2015  . CA of prostate ([place]) 04/12/2015  . H/O adenomatous polyp of colon 01/15/2015  . Temporary cerebral vascular dysfunction 11/21/2014  . Calcific shoulder tendinitis 08/01/2014  . Cervical nerve root disorder 04/07/2012    [name] PT 09-02-15, 6:01 PM  [place] [facility] [address] [place], [address] Phone: [phone]   Fax:  [phone]  Name: Quintez Maselli. MRN: [id number] Date of Birth: 1941/03/11

## 2015-08-27 ENCOUNTER — Ambulatory Visit: Payer: PPO | Admitting: Physical Therapy

## 2015-08-27 ENCOUNTER — Encounter: Payer: Self-pay | Admitting: Physical Therapy

## 2015-08-27 DIAGNOSIS — M25512 Pain in left shoulder: Secondary | ICD-10-CM | POA: Diagnosis not present

## 2015-08-27 DIAGNOSIS — M6281 Muscle weakness (generalized): Secondary | ICD-10-CM

## 2015-08-27 NOTE — Therapy (Signed)
[facility] [address], [place], [address] Phone: [phone]   Fax:  [phone]  Physical Therapy Treatment  Patient Details  Name: Rick Mcbride. MRN: [id number] Date of Birth: [birth year] Referring Provider: [name], MD  Encounter Date: 08/27/2015      PT End of Session - 08/27/15 1653    Visit Number 11   Number of Visits 16   Date for PT Re-Evaluation 09/18/15   Authorization Type 11   Authorization Time Period 20   PT Start Time 1623   PT Stop Time 1653   PT Time Calculation (min) 30 min   Activity Tolerance Patient tolerated treatment well   Behavior During Therapy [facility] for tasks assessed/performed      Past Medical History  Diagnosis Date  . Heart disease   . GERD (gastroesophageal reflux disease)   . Hyperlipidemia   . Sleep apnea   . Hypertension   . Neuralgia   . Hypercholesteremia   . Sinoatrial node dysfunction (HCC)   . DDD (degenerative disc disease), lumbar   . Headache   . Meralgia paresthetica   . Atrophic kidney   . Osteoarthritis   . Anxiety   . Anemia   . 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 Second degree AV block   . Prostate cancer ([place])   . Lung cancer ([place])   . AAA (abdominal aortic aneurysm) ([place])   . Lung cancer ([place])   . TIA (transient ischemic attack)   . Cervical radiculopathy   . Stroke ([place])   . Coronary artery disease   . Presence of permanent cardiac pacemaker   . AAA (abdominal aortic aneurysm) without rupture [facility])     Past Surgical History  Procedure Laterality Date  . Coronary atherosclerosis of autologous vein bypass graft    . Coronary artery bypass graft    . Knee arthroscopy    . Prostatectomy    . Total shoulder replacement    . Colonoscopy    . Polypectomy    . Pacemaker insertion    . Lobectomy    . Total knee arthroplasty Bilateral   . Cataract extraction    . Joint replacement    . Eye surgery  Bilateral     cataract extraction  . Total shoulder arthroplasty Left 07/10/2015    Procedure: TOTAL SHOULDER ARTHROPLASTY;  Surgeon: [name], MD;  Location: [facility] ORS;  Service: Orthopedics;  Laterality: Left;    There were no vitals filed for this visit.  Visit Diagnosis:  Pain in left shoulder  Muscle weakness      Subjective Assessment - 08/27/15 1632    Subjective Patient reports no increased symptoms left shoulder. He feels he is steadily improving and without complaints.    Patient Stated Goals Patient would like to decrease pain in left sholder and be able to use left arm with less difficulty and return to using left UE fully without difficulty for personal care and household chores   Currently in Pain? No/denies             [facility] Adult PT Treatment/Exercise - 08/27/15 1632    Exercises   Exercises Other Exercises   Other Exercises  all performed with guidance, verbal/tactile cueing of physical therapist:shoulder forward elevation to130, ER 45 degrees in scapular plane 2-3 sets of 5-10 reps to limit of motion within boundaries of protocol, scapular adduction with tactile and verbal  cuing 1 x 10 reps, rhythmic stabilization with contact guard of therapist at 90 degrees elevation and neutral ER/IR 3 sets x 5 reps, 5 second holds, standing AAROM with left UE on ball and cuing of therapist with verbal cues to keep shoulder back and down and control motion at shoulder: 3 sets x 10 reps, elbow flexion and extension performed with light manual resistance through ROM with patient supine lying 1 x 10, isometric flexion and ER performed in sitting with manual graded resistance of therapist and verbal cues x 10, side lying AAROM ER x 10 reps        Patient response to treatment: improved AAROM with decreased pain and improved flexibility noted in left shoulder with exercises and guidance of therapist with verbal cuing and tactile cues to perform with correct alignment of shoulder with  standing and supine exercises. Good demonstration of exercises performed with repetition         PT Education - 08/27/15 2252    Education provided Yes   Education Details Patient was instruted in proper control, alignment of left shoulder with all exercises   Person(s) Educated Patient   Methods Explanation;Demonstration;Verbal cues   Comprehension Verbalized understanding;Returned demonstration;Verbal cues required             PT Long Term Goals - 08/25/15 1756    PT LONG TERM GOAL #1   Title Patient will demonstrate improvement in functional use of left shoulder/UE with improved QuickDash score of 50% or better by 08/23/2015   Baseline current quick Dash score 73% impairment  (08/25/2015 55%)   Status Partially Met   PT LONG TERM GOAL #2   Title Patient will demonstrate independent home exericses and improved control of scapular musculature without cuing by 09/18/2015   Baseline limited to no knowledge of appropriate exercises to perform to improve strength/scontrol left shoulder s/p TSA   Status On-going   PT LONG TERM GOAL #3   Title Patient will demonstrate improved funciton with minimal to no limitations with left UE as demonstrated by quick Dash score of 30% or less by 09/18/2015   Baseline QuickDash score = 73%   Status On-going   PT LONG TERM GOAL #4   Title Patient will improve AROM left shoulder to 140 degrees flexion or better by 09/18/2015 for improved overhead motion for dressing upper body, reaching into cabinets, hair care   Baseline AAROM 90 degrees forward elevation   Status On-going               Plan - 08/27/15 1700    Clinical Impression Statement Patient is progressing well towards goals with improving with steady progress s/p TSA. He continues with weakness and requires cuing to perform exercises with proper technique and good alignment.    Pt will benefit from skilled therapeutic intervention in order to improve on the following deficits  Decreased strength;Pain;Decreased range of motion;Impaired UE functional use   Rehab Potential Good   PT Frequency 2x / week   PT Duration 8 weeks   PT Treatment/Interventions Therapeutic exercise;Moist Heat;Patient/family education;Manual techniques   PT Next Visit Plan pain control, progressive strengthening and motor control exercises left UE/shoulder per protocol guidelines        Problem List Patient Active Problem List   Diagnosis Date Noted  . Status post total shoulder replacement 07/10/2015  . Absolute anemia 04/12/2015  . Atrophic kidney 04/12/2015  . Essential (primary) hypertension 04/12/2015  . Acid reflux 04/12/2015  . Cancer of lung ([place]) 04/12/2015  .  CA of prostate ([place]) 04/12/2015  . H/O adenomatous polyp of colon 01/15/2015  . Temporary cerebral vascular dysfunction 11/21/2014  . Calcific shoulder tendinitis 08/01/2014  . Cervical nerve root disorder 04/07/2012    [name] PT 08/28/2015, 8:01 PM  [facility] [address] [place], [address] Phone: [phone]   Fax:  [phone]  Name: Rick Mcbride. MRN: [id number] Date of Birth: 1941-07-04

## 2015-09-01 ENCOUNTER — Encounter: Payer: PPO | Admitting: Physical Therapy

## 2015-09-03 ENCOUNTER — Ambulatory Visit: Payer: PPO | Admitting: Physical Therapy

## 2015-09-03 ENCOUNTER — Encounter: Payer: Self-pay | Admitting: Physical Therapy

## 2015-09-03 DIAGNOSIS — M6281 Muscle weakness (generalized): Secondary | ICD-10-CM

## 2015-09-03 DIAGNOSIS — M25512 Pain in left shoulder: Secondary | ICD-10-CM

## 2015-09-03 NOTE — Therapy (Signed)
[facility] [address] [place], [address] Phone: [phone]   Fax:  [phone]  Physical Therapy Treatment  Patient Details  Name: Rick Mcbride. MRN: [id number] Date of Birth: 01-Feb-1941 Referring Provider: Milagros Evener, MD  Encounter Date: 09/03/2015      PT End of Session - 09/03/15 1730    Visit Number 12   Number of Visits 16   Date for PT Re-Evaluation 09/18/15   Authorization Type 12   Authorization Time Period 20   PT Start Time 1613   PT Stop Time 1645   PT Time Calculation (min) 32 min   Activity Tolerance Patient tolerated treatment well   Behavior During Therapy [facility] for tasks assessed/performed      Past Medical History  Diagnosis Date  . Heart disease   . GERD (gastroesophageal reflux disease)   . Hyperlipidemia   . Sleep apnea   . Hypertension   . Neuralgia   . Hypercholesteremia   . Sinoatrial node dysfunction (HCC)   . DDD (degenerative disc disease), lumbar   . Headache   . Meralgia paresthetica   . Atrophic kidney   . Osteoarthritis   . Anxiety   . Anemia   . 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 Second degree AV block   . Prostate cancer ([place])   . Lung cancer ([place])   . AAA (abdominal aortic aneurysm) ([place])   . Lung cancer ([place])   . TIA (transient ischemic attack)   . Cervical radiculopathy   . Stroke ([place])   . Coronary artery disease   . Presence of permanent cardiac pacemaker   . AAA (abdominal aortic aneurysm) without rupture [facility])     Past Surgical History  Procedure Laterality Date  . Coronary atherosclerosis of autologous vein bypass graft    . Coronary artery bypass graft    . Knee arthroscopy    . Prostatectomy    . Total shoulder replacement    . Colonoscopy    . Polypectomy    . Pacemaker insertion    . Lobectomy    . Total knee arthroplasty Bilateral   . Cataract extraction    . Joint replacement    . Eye surgery  Bilateral     cataract extraction  . Total shoulder arthroplasty Left 07/10/2015    Procedure: TOTAL SHOULDER ARTHROPLASTY;  Surgeon: [name], MD;  Location: [facility] ORS;  Service: Orthopedics;  Laterality: Left;    There were no vitals filed for this visit.  Visit Diagnosis:  Pain in left shoulder  Muscle weakness      Subjective Assessment - 09/03/15 1614    Subjective Patient reports he is getting stronger with exercises in supine position and able to cross arm in front with less difficulty. He continues with weakness and some soreness at limits of ROM.    Patient Stated Goals Patient would like to decrease pain in left sholder and be able to use left arm with less difficulty and return to using left UE fully without difficulty for personal care and household chores   Currently in Pain? No/denies   Pain Score --  soreness/pain into upper arm not in joint anymore   Multiple Pain Sites No       Objective: AAROM: left shoulder (supine lying in scapular plane): forward elevation: 0-135, ER 0-45, IR across trunk Strength: at least 3/5 within available ranges left shoulder flexion, rotations; good  control of periscapular muscles in supine and standing Palpation; mild decreased soft tissue elasticity in lats, pectoral and upper trapezius muscles left shoulder girdle        [facility] Adult PT Treatment/Exercise - 09/03/15 1616    Exercises   Exercises Other Exercises   Other Exercises  all performed with guidance, verbal/tactile cueing of physical therapist:shoulder forward elevation to135, ER 45 degrees in scapular plane 2-3 sets of 5-10 reps to limit of motion within boundaries of protocol, scapular adduction with tactile and verbal cuing 1 x 10 reps, rhythmic stabilization with contact guard of therapist at 90 degrees elevation and neutral ER/IR 3 sets x 5 reps, 5 second holds, elbow flexion/extension with manual resistance (light) x 10 reps,  standing AAROM with left UE on ball and  cuing of therapist with verbal cues to keep shoulder back and down and control motion at shoulder: 3 sets x 15 reps, elbow flexion and extension performed with light manual resistance through ROM with patient supine lying 1 x 10, isometric flexion and ER performed in sitting with manual graded resistance of therapist and verbal cues x 10, side lying AAROM ER x 10 reps        Patient response to treatment: improved AAROM without increased pain and improved flexibility noted in left shoulder with exercises and guidance of therapist with verbal cuing and tactile cues to perform with correct alignment of shoulder with standing and supine exercises. Improved control with exercises performed with repetition           PT Education - 09/03/15 2239    Education provided Yes   Education Details continue with home program for [facility] and do not perform AROM until good alignment and control are achieved in left shoulder   Person(s) Educated Patient   Methods Explanation;Demonstration;Verbal cues   Comprehension Verbalized understanding;Returned demonstration;Verbal cues required             PT Long Term Goals - 08/25/15 1756    PT LONG TERM GOAL #1   Title Patient will demonstrate improvement in functional use of left shoulder/UE with improved QuickDash score of 50% or better by 08/23/2015   Baseline current quick Dash score 73% impairment  (08/25/2015 55%)   Status Partially Met   PT LONG TERM GOAL #2   Title Patient will demonstrate independent home exericses and improved control of scapular musculature without cuing by 09/18/2015   Baseline limited to no knowledge of appropriate exercises to perform to improve strength/scontrol left shoulder s/p TSA   Status On-going   PT LONG TERM GOAL #3   Title Patient will demonstrate improved funciton with minimal to no limitations with left UE as demonstrated by quick Dash score of 30% or less by 09/18/2015   Baseline QuickDash score = 73%   Status  On-going   PT LONG TERM GOAL #4   Title Patient will improve AROM left shoulder to 140 degrees flexion or better by 09/18/2015 for improved overhead motion for dressing upper body, reaching into cabinets, hair care   Baseline AAROM 90 degrees forward elevation   Status On-going               Plan - 09/03/15 1700    Clinical Impression Statement Patient is progressing well with improving ROM and scapular control as demostrated during treatment session with tactile and verbal cuing required for good shoulder alignment and to prevent hiking left shoulder with standing and supine exercises.    Pt will benefit from skilled therapeutic intervention  in order to improve on the following deficits Decreased strength;Pain;Decreased range of motion;Impaired UE functional use   Rehab Potential Good   PT Frequency 2x / week   PT Duration 8 weeks   PT Treatment/Interventions Therapeutic exercise;Moist Heat;Patient/family education;Manual techniques   PT Next Visit Plan pain control, progressive strengthening and motor control exercises left UE/shoulder per protocol guidelines        Problem List Patient Active Problem List   Diagnosis Date Noted  . Status post total shoulder replacement 07/10/2015  . Absolute anemia 04/12/2015  . Atrophic kidney 04/12/2015  . Essential (primary) hypertension 04/12/2015  . Acid reflux 04/12/2015  . Cancer of lung ([place]) 04/12/2015  . CA of prostate ([place]) 04/12/2015  . H/O adenomatous polyp of colon 01/15/2015  . Temporary cerebral vascular dysfunction 11/21/2014  . Calcific shoulder tendinitis 08/01/2014  . Cervical nerve root disorder 04/07/2012    [name] PT 09/04/2015, 3:54 PM  [facility] [address] [place], [address] Phone: [phone]   Fax:  [phone]  Name: Timmie Dugue. MRN: [id number] Date of Birth: [birth year]

## 2015-09-08 ENCOUNTER — Encounter: Payer: PPO | Admitting: Physical Therapy

## 2015-09-10 ENCOUNTER — Ambulatory Visit: Payer: PPO | Attending: Internal Medicine | Admitting: Physical Therapy

## 2015-09-10 ENCOUNTER — Encounter: Payer: Self-pay | Admitting: Physical Therapy

## 2015-09-10 DIAGNOSIS — M25512 Pain in left shoulder: Secondary | ICD-10-CM | POA: Diagnosis present

## 2015-09-10 DIAGNOSIS — M6281 Muscle weakness (generalized): Secondary | ICD-10-CM | POA: Diagnosis present

## 2015-09-10 NOTE — Therapy (Signed)
[facility] [address] [place], [address] Phone: [phone]   Fax:  [phone]  Physical Therapy Treatment  Patient Details  Name: Rick Mcbride. MRN: [id number] Date of Birth: [birth year] Referring Provider: Milagros Evener, MD  Encounter Date: 09/10/2015      PT End of Session - 09/10/15 1603    Visit Number 13   Number of Visits 16   Date for PT Re-Evaluation 09/18/15   Authorization Type 13   Authorization Time Period 20   PT Start Time 1603   PT Stop Time 1645   PT Time Calculation (min) 42 min   Activity Tolerance Patient tolerated treatment well   Behavior During Therapy [facility] for tasks assessed/performed      Past Medical History  Diagnosis Date  . Heart disease   . GERD (gastroesophageal reflux disease)   . Hyperlipidemia   . Sleep apnea   . Hypertension   . Neuralgia   . Hypercholesteremia   . Sinoatrial node dysfunction (HCC)   . DDD (degenerative disc disease), lumbar   . Headache   . Meralgia paresthetica   . Atrophic kidney   . Osteoarthritis   . Anxiety   . Anemia   . 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 Second degree AV block   . Prostate cancer ([place])   . Lung cancer ([place])   . AAA (abdominal aortic aneurysm) ([place])   . Lung cancer ([place])   . TIA (transient ischemic attack)   . Cervical radiculopathy   . Stroke ([place])   . Coronary artery disease   . Presence of permanent cardiac pacemaker   . AAA (abdominal aortic aneurysm) without rupture [facility])     Past Surgical History  Procedure Laterality Date  . Coronary atherosclerosis of autologous vein bypass graft    . Coronary artery bypass graft    . Knee arthroscopy    . Prostatectomy    . Total shoulder replacement    . Colonoscopy    . Polypectomy    . Pacemaker insertion    . Lobectomy    . Total knee arthroplasty Bilateral   . Cataract extraction    . Joint replacement    . Eye surgery  Bilateral     cataract extraction  . Total shoulder arthroplasty Left 07/10/2015    Procedure: TOTAL SHOULDER ARTHROPLASTY;  Surgeon: [name], MD;  Location: [facility] ORS;  Service: Orthopedics;  Laterality: Left;    There were no vitals filed for this visit.  Visit Diagnosis:  Pain in left shoulder  Muscle weakness      Subjective Assessment - 09/10/15 1609    Subjective Patient reports he is getting stronger with exercises in supine position and able to cross arm in front with less difficulty. He continues with weakness and some soreness at limits of ROM. He is using the arm more normally now. He is progressing well.    Patient Stated Goals Patient would like to decrease pain in left sholder and be able to use left arm with less difficulty and return to using left UE fully without difficulty for personal care and household chores   Currently in Pain? No/denies   Pain Score --  1-2 pain level with soreness and upper arm soreness   Pain Location Shoulder   Pain Orientation Left   Pain Descriptors / Indicators Sore   Pain Onset Other (comment)  surgery 07/10/2015  Pain Frequency Intermittent         Objective: AAROM: left shoulder (supine lying in scapular plane): forward elevation: 0-130, ER 0-60, IR across trunk Strength: at least 3/5 within available ranges left shoulder flexion, rotations; good control of periscapular muscles in supine and standing Palpation; mild decreased soft tissue elasticity in lats, pectoral and upper trapezius muscles left shoulder girdle        [facility] Adult PT Treatment/Exercise - 09/10/15 1613    Exercises   Exercises Other Exercises   Other Exercises  all performed with guidance, verbal/tactile cuing of physical therapist:shoulder forward elevation to130, ER 45 degrees in scapular plane 2-3 sets of 5-10 reps to limit of motion within boundaries of protocol, scapular adduction with tactile and verbal cuing 1 x 10 reps, rhythmic stabilization with  contact guard of therapist at 90 degrees elevation and neutral ER/IR 3 sets x 10 reps, 5 second holds, standing scapular rows with yellow tubing x 10 reps with demonstration and cuing of therapist  to keep shoulder back and down and control motion at shoulder:  elbow flexion and extension performed with light manual resistance of resistive band through ROM with patient in standing, isometric flexion at wall performed with assistance of therapist and verbal cues x 10, side lying AROM ER x  With towel between arm and trunk x 10 reps      STM performed to left upper trapezius muscle and latissimus muscle with patient supine lying in conjunction with exercises.     Patient response to treatment: improved AAROM without increased pain and improved flexibility noted in left shoulder with exercises and guidance of therapist with verbal cuing and tactile cues to perform with correct alignment of shoulder with standing and supine exercises. Improved control with exercises performed with repetition. Improved flexibility with forward elevation and rotations following STM to lats and upper trapezius muscle.         PT Long Term Goals - 08/25/15 1756    PT LONG TERM GOAL #1   Title Patient will demonstrate improvement in functional use of left shoulder/UE with improved QuickDash score of 50% or better by 08/23/2015   Baseline current quick Dash score 73% impairment  (08/25/2015 55%)   Status Partially Met   PT LONG TERM GOAL #2   Title Patient will demonstrate independent home exericses and improved control of scapular musculature without cuing by 09/18/2015   Baseline limited to no knowledge of appropriate exercises to perform to improve strength/scontrol left shoulder s/p TSA   Status On-going   PT LONG TERM GOAL #3   Title Patient will demonstrate improved funciton with minimal to no limitations with left UE as demonstrated by quick Dash score of 30% or less by 09/18/2015   Baseline QuickDash score =  73%   Status On-going   PT LONG TERM GOAL #4   Title Patient will improve AROM left shoulder to 140 degrees flexion or better by 09/18/2015 for improved overhead motion for dressing upper body, reaching into cabinets, hair care   Baseline AAROM 90 degrees forward elevation   Status On-going               Plan - 09/10/15 1752    Clinical Impression Statement Patient continues to demonstrate improvement in ROM and strength in left UE and improved functional use of left UE at home with personal care and light household tasks. He continues with weakness and muscle spasms and limited ROM in left shoulder and will benefit from continued physical  therpay intervention to achieve goals .    Pt will benefit from skilled therapeutic intervention in order to improve on the following deficits Decreased strength;Pain;Decreased range of motion;Impaired UE functional use   Rehab Potential Good   PT Frequency 2x / week   PT Duration 8 weeks   PT Treatment/Interventions Therapeutic exercise;Moist Heat;Patient/family education;Manual techniques   PT Next Visit Plan pain control, progressive strengthening and motor control exercises left UE/shoulder per protocol guidelines        Problem List Patient Active Problem List   Diagnosis Date Noted  . Status post total shoulder replacement 07/10/2015  . Absolute anemia 04/12/2015  . Atrophic kidney 04/12/2015  . Essential (primary) hypertension 04/12/2015  . Acid reflux 04/12/2015  . Cancer of lung ([place]) 04/12/2015  . CA of prostate ([place]) 04/12/2015  . H/O adenomatous polyp of colon 01/15/2015  . Temporary cerebral vascular dysfunction 11/21/2014  . Calcific shoulder tendinitis 08/01/2014  . Cervical nerve root disorder 04/07/2012    [name] PT 09/10/2015, 5:57 PM  [facility] [address] [place], [address] Phone: [phone]   Fax:  [phone]  Name: Rick Mcbride. MRN: [id number] Date of Birth: Dec 20, 1940

## 2015-09-15 ENCOUNTER — Encounter: Payer: PPO | Admitting: Physical Therapy

## 2015-09-17 ENCOUNTER — Ambulatory Visit: Payer: PPO | Admitting: Physical Therapy

## 2015-09-17 ENCOUNTER — Encounter: Payer: Self-pay | Admitting: Physical Therapy

## 2015-09-17 DIAGNOSIS — M25512 Pain in left shoulder: Secondary | ICD-10-CM | POA: Diagnosis not present

## 2015-09-17 DIAGNOSIS — M6281 Muscle weakness (generalized): Secondary | ICD-10-CM

## 2015-09-18 NOTE — Therapy (Signed)
[facility] [address], [place], [address] Phone: [phone]   Fax:  [phone]  Physical Therapy Treatment  Patient Details  Name: Rick Mcbride. MRN: [id number] Date of Birth: February 15, 1941 No Data Recorded  Encounter Date: 09/17/2015      PT End of Session - 09/17/15 1715    Visit Number 14   Number of Visits 16   Date for PT Re-Evaluation 09/18/15   Authorization Type 14   Authorization Time Period 20   PT Start Time 1615   PT Stop Time 1700   PT Time Calculation (min) 45 min   Activity Tolerance Patient tolerated treatment well   Behavior During Therapy WFL for tasks assessed/performed      Past Medical History  Diagnosis Date  . Heart disease   . GERD (gastroesophageal reflux disease)   . Hyperlipidemia   . Sleep apnea   . Hypertension   . Neuralgia   . Hypercholesteremia   . Sinoatrial node dysfunction (HCC)   . DDD (degenerative disc disease), lumbar   . Headache   . Meralgia paresthetica   . Atrophic kidney   . Osteoarthritis   . Anxiety   . Anemia   . 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 Second degree AV block   . Prostate cancer ([place])   . Lung cancer ([place])   . AAA (abdominal aortic aneurysm) ([place])   . Lung cancer ([place])   . TIA (transient ischemic attack)   . Cervical radiculopathy   . Stroke ([place])   . Coronary artery disease   . Presence of permanent cardiac pacemaker   . AAA (abdominal aortic aneurysm) without rupture [facility])     Past Surgical History  Procedure Laterality Date  . Coronary atherosclerosis of autologous vein bypass graft    . Coronary artery bypass graft    . Knee arthroscopy    . Prostatectomy    . Total shoulder replacement    . Colonoscopy    . Polypectomy    . Pacemaker insertion    . Lobectomy    . Total knee arthroplasty Bilateral   . Cataract extraction    . Joint replacement    . Eye surgery Bilateral     cataract  extraction  . Total shoulder arthroplasty Left 07/10/2015    Procedure: TOTAL SHOULDER ARTHROPLASTY;  Surgeon: [name], MD;  Location: [facility] ORS;  Service: Orthopedics;  Laterality: Left;    There were no vitals filed for this visit.  Visit Diagnosis:  Muscle weakness  Pain in left shoulder      Subjective Assessment - 09/17/15 1628    Subjective Paitent reports he is stiff in left shoulder and is progressing well with personal care activities and light activities around the home. He is now able to raise arm further with less stiffness. He is still weak and cannot perform all activities without difficulty and feels he needs to continue with therapy for proper guidance and instruction to progress to full function.   Limitations Lifting;House hold activities   Patient Stated Goals Patient would like to decrease pain in left sholder and be able to use left arm with less difficulty and return to using left UE fully without difficulty for personal care and household chores   Currently in Pain? No/denies          Objective: AAROM: left shoulder (supine lying in scapular plane): forward elevation: 0-140, ER  0-60, IR across trunk Strength: at least 3/5 within available ranges left shoulder flexion, rotations; good control of periscapular muscles in supine and standing Palpation; mild decreased soft tissue elasticity in lats, pectoral and upper trapezius muscles left shoulder girdle       [facility] Adult PT Treatment/Exercise - 09/17/15 1630    Exercises   Exercises Other Exercises   Other Exercises  all performed with guidance, verbal/tactile cuing of physical therapist: supine lying: shoulder forward elevation to140, ER 60 degrees in scapular plane 2-3 sets of 5-10 reps to limit of motion within boundaries of protocol, scapular adduction with tactile and verbal cuing 1 x 10 reps, rhythmic stabilization with contact guard of therapist at 90 degrees elevation and neutral ER/IR 3 sets x 10  reps, 5 second holds, standing scapular rows with green tubing x 10 reps, elbow flexion and extension performed with 2# weight in hand x 15 reps with guidance to perform within limits not to add strain to elbow,  Side lying ER with 2# weight and towel along trunk 10 reps, standing at wall forward elevation with towel, within pain free range x 10 reps, OMEGA: 10# with scapular rows x 10 reps, single arm with 5# x 10 reps, lat pull downs with 10# straight arm x 10 reps,        STM performed to left upper trapezius muscle and latissimus muscle with patient supine lying in conjunction with exercises.     Patient response to treatment: improved AAROM without increased pain and improved flexibility noted in left shoulder with exercises and guidance of therapist with verbal cuing and tactile cues to perform with correct alignment of shoulder with standing and supine exercises. Improved control with exercises performed with repetition. Improved flexibility with forward elevation and rotations following STM to lats and upper trapezius muscle. Verbalized good understanding of exercises to perform for home.           PT Education - 09/17/15 1503    Education provided Yes   Education Details progress with exercises using 1-2# weights for forward elevation in supine lying and continue with AROM in sitting and standing   Person(s) Educated Patient   Methods Explanation;Demonstration;Verbal cues   Comprehension Verbalized understanding;Returned demonstration;Verbal cues required             PT Long Term Goals - 09/17/15 1545    PT LONG TERM GOAL #1   Title Patient will demonstrate improvement in functional use of left shoulder/UE with improved QuickDash score of 50% or better by 08/23/2015   Baseline current quick Dash score 73% impairment  (08/25/2015 55%)   Status Achieved   PT LONG TERM GOAL #2   Title Patient will demonstrate independent home exericses and improved control of scapular  musculature without cuing by 09/18/2015   Baseline limited to no knowledge of appropriate exercises to perform to improve strength/scontrol left shoulder s/p TSA   Status Achieved   PT LONG TERM GOAL #3   Title Patient will demonstrate improved funciton with minimal to no limitations with left UE as demonstrated by quick Dash score of 30% or less by 09/18/2015   Baseline QuickDash score = 73%   Status On-going   PT LONG TERM GOAL #4   Title Patient will improve AROM left shoulder to 140 degrees flexion or better by 09/18/2015 for improved overhead motion for dressing upper body, reaching into cabinets, hair care   Baseline AAROM 90 degrees forward elevation   Status On-going  Plan - 09/17/15 1710    Clinical Impression Statement Patient is progressing well with exercises and ROM/strength s/p TSA. He is able to perform exercises for strength and ROM and will decrease physical therapy session to every other week.    Pt will benefit from skilled therapeutic intervention in order to improve on the following deficits Decreased strength;Pain;Decreased range of motion;Impaired UE functional use   Rehab Potential Good   PT Frequency 2x / week   PT Duration 8 weeks   PT Treatment/Interventions Therapeutic exercise;Moist Heat;Patient/family education;Manual techniques   PT Next Visit Plan pain control, progressive strengthening and motor control exercises left UE/shoulder per protocol guidelines        Problem List Patient Active Problem List   Diagnosis Date Noted  . Status post total shoulder replacement 07/10/2015  . Absolute anemia 04/12/2015  . Atrophic kidney 04/12/2015  . Essential (primary) hypertension 04/12/2015  . Acid reflux 04/12/2015  . Cancer of lung ([place]) 04/12/2015  . CA of prostate ([place]) 04/12/2015  . H/O adenomatous polyp of colon 01/15/2015  . Temporary cerebral vascular dysfunction 11/21/2014  . Calcific shoulder tendinitis 08/01/2014  . Cervical  nerve root disorder 04/07/2012    [name] PT 09/18/2015, 4:13 PM  [facility] [address], [place], [address] Phone: [phone]   Fax:  [phone]  Name: Rick Mcbride. MRN: [id number] Date of Birth: 30-Nov-1940

## 2015-09-24 ENCOUNTER — Ambulatory Visit: Payer: PPO | Admitting: Physical Therapy

## 2015-10-01 ENCOUNTER — Encounter: Payer: Self-pay | Admitting: Physical Therapy

## 2015-10-01 ENCOUNTER — Ambulatory Visit: Payer: PPO | Admitting: Physical Therapy

## 2015-10-01 DIAGNOSIS — M6281 Muscle weakness (generalized): Secondary | ICD-10-CM

## 2015-10-02 NOTE — Therapy (Addendum)
[place] [facility] [address] [place], [address] Phone: [phone]   Fax:  [phone]  Physical Therapy Discharge Summary  Patient Details  Name: Rick Mcbride. MRN: [id number] Date of Birth: [birth year] Referring Provider: [name], MD  Encounter Date: 10/01/2015  Patient began physical therapy on 07/24/2015 and has attended  14 visits through 09/17/2015. He has achieved all goals for improving ROM and strength for left UE  S/p TSA 07/10/15 and is ready for discharge to independent home program.  Past Medical History  Diagnosis Date  . Heart disease   . GERD (gastroesophageal reflux disease)   . Hyperlipidemia   . Sleep apnea   . Hypertension   . Neuralgia   . Hypercholesteremia   . Sinoatrial node dysfunction (HCC)   . DDD (degenerative disc disease), lumbar   . Headache   . Meralgia paresthetica   . Atrophic kidney   . Osteoarthritis   . Anxiety   . Anemia   . 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 Second degree AV block   . Prostate cancer ([place])   . Lung cancer ([place])   . AAA (abdominal aortic aneurysm) ([place])   . Lung cancer ([place])   . TIA (transient ischemic attack)   . Cervical radiculopathy   . Stroke ([place])   . Coronary artery disease   . Presence of permanent cardiac pacemaker   . AAA (abdominal aortic aneurysm) without rupture [facility])     Past Surgical History  Procedure Laterality Date  . Coronary atherosclerosis of autologous vein bypass graft    . Coronary artery bypass graft    . Knee arthroscopy    . Prostatectomy    . Total shoulder replacement    . Colonoscopy    . Polypectomy    . Pacemaker insertion    . Lobectomy    . Total knee arthroplasty Bilateral   . Cataract extraction    . Joint replacement    . Eye surgery Bilateral     cataract extraction  . Total shoulder arthroplasty Left 07/10/2015    Procedure: TOTAL SHOULDER ARTHROPLASTY;  Surgeon: [name], MD;  Location: [facility] ORS;  Service: Orthopedics;  Laterality: Left;    There were no vitals filed for this visit.  Visit Diagnosis:  Muscle weakness of left upper extremity      Subjective Assessment - 10/01/15 1410    Subjective Patient reports he is doing well and knows his exercises and feels he can continue with exercises on own and continue to improve with functional use left shoulder.    Limitations Lifting;House hold activities   Patient Stated Goals Patient would like to decrease pain in left sholder and be able to use left arm with less difficulty and return to using left UE fully without difficulty for personal care and household chores   Currently in Pain? No/denies   Pain Score --  mild soreness left upper arm/shoulder   Pain Onset Other (comment)  surgery TSA 07/10/15       Outcome measure; QuickDash: 12% self perceived impairment with left UE  Left shoulder: 140 degrees of AAROM forward elevation, 60 degrees of ER and active forward elevation of 120 degrees sitting. His strength is at least 3+/5 in left shoulder forward elevation, rotations.  Palpation: mild decreased soft tissue elasticity left latissimus muscles and subscapularis left shoulder, improved with self ROM exercises  PT Long Term Goals - 10/01/15 1430    PT LONG TERM GOAL #1   Title Patient will demonstrate improvement in functional use of left shoulder/UE with improved QuickDash score of 50% or better by 08/23/2015   Baseline current quick Dash score 73% impairment  (08/25/2015 55%)   Status Achieved   PT LONG TERM GOAL #2   Title Patient will demonstrate independent home exericses and improved control of scapular musculature without cuing by 09/18/2015   Baseline limited to no knowledge of appropriate exercises to perform to improve strength/scontrol left shoulder s/p TSA   Status Achieved   PT LONG TERM GOAL #3   Title Patient will demonstrate improved funciton with minimal to no  limitations with left UE as demonstrated by quick Dash score of 30% or less by 09/18/2015   Baseline QuickDash score = 73% (current impairment level 10/01/15 12%)   Status Achieved   PT LONG TERM GOAL #4   Title Patient will improve AROM left shoulder to 140 degrees flexion or better by 09/18/2015 for improved overhead motion for dressing upper body, reaching into cabinets, hair care   Baseline AAROM 90 degrees forward elevation (current AAROM 140, sitting AROM 120)   Status Achieved               Plan - 10/01/15 1420    Clinical Impression Statement Patient has achieved goals and feels he is ready to dishcare to independent home program and self management. He has up to 140 degrees of AAROM forward elevation, 60 degrees of ER and active forward elevation of 120 degrees sitting. His strength is at least 3+/5 in left shoulder forward elevation, rotations. His current impairment level is 15% or better based on ROM, Quick Dash score and strength and he should continue to imrpove with funciton, strength and ROM with home program and self management.   Pt will benefit from skilled therapeutic intervention in order to improve on the following deficits Decreased strength;Pain;Decreased range of motion;Impaired UE functional use   Rehab Potential Good   PT Frequency 2x / week   PT Duration 8 weeks   PT Next Visit Plan discharge from physical therapy    Consulted and Agree with Plan of Care Patient       Problem List Patient Active Problem List   Diagnosis Date Noted  . Status post total shoulder replacement 07/10/2015  . Absolute anemia 04/12/2015  . Atrophic kidney 04/12/2015  . Essential (primary) hypertension 04/12/2015  . Acid reflux 04/12/2015  . Cancer of lung ([place]) 04/12/2015  . CA of prostate ([place]) 04/12/2015  . H/O adenomatous polyp of colon 01/15/2015  . Temporary cerebral vascular dysfunction 11/21/2014  . Calcific shoulder tendinitis 08/01/2014  . Cervical nerve root  disorder 04/07/2012    [name] PT 10/02/2015, 7:42 AM  [place] [address], [place], [address] Phone: [phone]   Fax:  [phone]  Name: Rick Mcbride. MRN: [id number] Date of Birth: 09-Sep-1941

## 2015-10-07 ENCOUNTER — Ambulatory Visit
Admission: RE | Admit: 2015-10-07 | Discharge: 2015-10-07 | Disposition: A | Discharge status: Home / Self Care | Payer: PPO | Source: Ambulatory Visit | Attending: Oncology | Admitting: Oncology

## 2015-10-07 ENCOUNTER — Telehealth: Payer: Self-pay | Admitting: *Deleted

## 2015-10-07 DIAGNOSIS — J439 Emphysema, unspecified: Secondary | ICD-10-CM | POA: Diagnosis not present

## 2015-10-07 DIAGNOSIS — C3491 Malignant neoplasm of unspecified part of right bronchus or lung: Secondary | ICD-10-CM

## 2015-10-07 NOTE — Telephone Encounter (Signed)
Asking if he is to have a CXR before his appt Thursday

## 2015-10-07 NOTE — Telephone Encounter (Signed)
Order entered and patient notified.States will go for it before appt

## 2015-10-08 ENCOUNTER — Encounter: Payer: PPO | Admitting: Physical Therapy

## 2015-10-09 ENCOUNTER — Inpatient Hospital Stay: Payer: PPO | Attending: Oncology

## 2015-10-09 ENCOUNTER — Inpatient Hospital Stay (HOSPITAL_BASED_OUTPATIENT_CLINIC_OR_DEPARTMENT_OTHER): Payer: PPO | Admitting: Oncology

## 2015-10-09 ENCOUNTER — Encounter: Payer: Self-pay | Admitting: Oncology

## 2015-10-09 VITALS — BP 124/78 | HR 69 | Temp 99.3°F | Wt 221.8 lb

## 2015-10-09 DIAGNOSIS — Z872 Personal history of diseases of the skin and subcutaneous tissue: Secondary | ICD-10-CM | POA: Insufficient documentation

## 2015-10-09 DIAGNOSIS — Z87891 Personal history of nicotine dependence: Secondary | ICD-10-CM | POA: Insufficient documentation

## 2015-10-09 DIAGNOSIS — C3490 Malignant neoplasm of unspecified part of unspecified bronchus or lung: Secondary | ICD-10-CM

## 2015-10-09 DIAGNOSIS — Z7982 Long term (current) use of aspirin: Secondary | ICD-10-CM | POA: Diagnosis not present

## 2015-10-09 DIAGNOSIS — C61 Malignant neoplasm of prostate: Secondary | ICD-10-CM | POA: Insufficient documentation

## 2015-10-09 DIAGNOSIS — I495 Sick sinus syndrome: Secondary | ICD-10-CM | POA: Insufficient documentation

## 2015-10-09 DIAGNOSIS — J439 Emphysema, unspecified: Secondary | ICD-10-CM | POA: Insufficient documentation

## 2015-10-09 DIAGNOSIS — Z79899 Other long term (current) drug therapy: Secondary | ICD-10-CM | POA: Insufficient documentation

## 2015-10-09 DIAGNOSIS — C3431 Malignant neoplasm of lower lobe, right bronchus or lung: Secondary | ICD-10-CM | POA: Diagnosis present

## 2015-10-09 DIAGNOSIS — Z950 Presence of cardiac pacemaker: Secondary | ICD-10-CM | POA: Insufficient documentation

## 2015-10-09 DIAGNOSIS — J449 Chronic obstructive pulmonary disease, unspecified: Secondary | ICD-10-CM | POA: Insufficient documentation

## 2015-10-09 DIAGNOSIS — E7800 Pure hypercholesterolemia, unspecified: Secondary | ICD-10-CM | POA: Insufficient documentation

## 2015-10-09 LAB — CBC WITH DIFFERENTIAL/PLATELET
BASOS ABS: 0 10*3/uL (ref 0–0.1)
BASOS PCT: 1 %
EOS ABS: 0.1 10*3/uL (ref 0–0.7)
Eosinophils Relative: 3 %
HEMATOCRIT: 39.6 % — AB (ref 40.0–52.0)
HEMOGLOBIN: 13.2 g/dL (ref 13.0–18.0)
Lymphocytes Relative: 29 %
Lymphs Abs: 1.3 10*3/uL (ref 1.0–3.6)
MCH: 29.2 pg (ref 26.0–34.0)
MCHC: 33.2 g/dL (ref 32.0–36.0)
MCV: 88 fL (ref 80.0–100.0)
MONO ABS: 0.5 10*3/uL (ref 0.2–1.0)
Monocytes Relative: 12 %
NEUTROS ABS: 2.4 10*3/uL (ref 1.4–6.5)
NEUTROS PCT: 55 %
Platelets: 177 10*3/uL (ref 150–440)
RBC: 4.5 MIL/uL (ref 4.40–5.90)
RDW: 15 % — AB (ref 11.5–14.5)
WBC: 4.4 10*3/uL (ref 3.8–10.6)

## 2015-10-09 LAB — COMPREHENSIVE METABOLIC PANEL
ALBUMIN: 3.6 g/dL (ref 3.5–5.0)
ALT: 27 U/L (ref 17–63)
ANION GAP: 8 (ref 5–15)
AST: 45 U/L — AB (ref 15–41)
Alkaline Phosphatase: 74 U/L (ref 38–126)
BILIRUBIN TOTAL: 0.6 mg/dL (ref 0.3–1.2)
BUN: 19 mg/dL (ref 6–20)
CO2: 29 mmol/L (ref 22–32)
Calcium: 8.9 mg/dL (ref 8.9–10.3)
Chloride: 102 mmol/L (ref 101–111)
Creatinine, Ser: 1.59 mg/dL — ABNORMAL HIGH (ref 0.61–1.24)
GFR calc Af Amer: 48 mL/min — ABNORMAL LOW (ref >60)
GFR calc non Af Amer: 41 mL/min — ABNORMAL LOW (ref >60)
GLUCOSE: 107 mg/dL — AB (ref 65–99)
POTASSIUM: 4.3 mmol/L (ref 3.5–5.1)
SODIUM: 139 mmol/L (ref 135–145)
TOTAL PROTEIN: 6.4 g/dL — AB (ref 6.5–8.1)

## 2015-10-10 ENCOUNTER — Encounter: Payer: Self-pay | Admitting: Oncology

## 2015-10-10 NOTE — Progress Notes (Signed)
[place] [facility] [phone]  Fax:([phone] [place] OB: 1941-07-27  MR#: [id number]  JYN#:[id number]  Patient Care Team: Idelle Crouch, MD as PCP - General (Internal Medicine)  CHIEF COMPLAINT:  Chief Complaint  Patient presents with  . 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  All the margins are clear No blood vessel invasion     Cancer of lung ([place])   04/12/2015 Initial Diagnosis Cancer of lung    Oncology Flowsheet 07/10/2015  dexamethasone (DECADRON) IJ -  ondansetron (ZOFRAN) IV -    INTERVAL HISTORY: 74 year old gentleman in the history of carcinoma of right lower lobe lung status post resection came today for a follow-up.  Since last evaluation patient had developed right-sided stroke from which he is recovering. No cough or shortness of breath appetite remains stable  Patient is here for ongoing evaluation and follow-up regarding carcinoma of lung. Patient had a left shoulder surgery done. Pain has improved.  Had a chest x-ray which has been reviewed independently there is no evidence of recurrent or progressive disease  REVIEW OF SYSTEMS:   GENERAL:  Feels good.  Active.  No fevers, sweats or weight loss. PERFORMANCE STATUS (ECOG):  01 HEENT:  No visual changes, runny nose, sore throat, mouth sores or tenderness. Lungs: No shortness of breath or cough.  No hemoptysis. Cardiac:  No chest pain, palpitations, orthopnea, or PND. GI:  No nausea, vomiting, diarrhea, constipation, melena or hematochezia. GU:  No urgency, frequency, dysuria, or hematuria. Musculoskeletal:  No back pain.  No joint pain.  No muscle tenderness. Extremities:  No pain or swelling. Skin:  No rashes or skin changes. Neuro:  Patient developed stroke and was admitted in hospital from which patient is recovering Endocrine:  No diabetes, thyroid  issues, hot flashes or night sweats. Psych:  No mood changes, depression or anxiety. Pain:  No focal pain. Review of systems:  All other systems reviewed and found to be negative. As per HPI. Otherwise, a complete review of systems is negatve.  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  All the margins are clear No blood vessel invasion ADVANCED DIRECTIVES:  Patient does have advanced health care directive  HEALTH MAINTENANCE: Social History  Substance Use Topics  . Smoking status: Former Smoker -- 1.50 packs/day for 45 years    Quit date: 04/07/2004  . Smokeless tobacco: None  . Alcohol Use: No      No Known Allergies  Current Outpatient Prescriptions  Medication Sig Dispense Refill  . aspirin EC 81 MG tablet Take 81 mg by mouth daily.     Marland Kitchen atorvastatin (LIPITOR) 40 MG tablet Take 40 mg by mouth daily at 6 PM.     . Calcium Carb-Cholecalciferol (CALCIUM 600 + D PO) Take 1 tablet by mouth daily.    . cetirizine (ZYRTEC) 10 MG tablet Take 10 mg by mouth daily.    . clopidogrel (PLAVIX) 75 MG tablet Take 75 mg by mouth daily.     . cyanocobalamin 1000 MCG tablet Take 100 mcg by mouth daily.    Marland Kitchen donepezil (ARICEPT) 5 MG tablet Take 5 mg by mouth at bedtime.    . ferrous sulfate (SLOW FE) 160 (50 FE) MG TBCR SR tablet Take 1 tablet by mouth 2 (  two) times daily.     Marland Kitchen lisinopril (PRINIVIL,ZESTRIL) 5 MG tablet Take 5 mg by mouth daily.     . Multiple Vitamins-Minerals (CENTRUM SILVER ULTRA MENS) TABS Take 1 tablet by mouth daily.     . Multiple Vitamins-Minerals (PRESERVISION/LUTEIN) CAPS Take 1 capsule by mouth 2 (two) times daily.     . niacin (NIASPAN) 500 MG CR tablet Take 500 mg by mouth daily.     . Omega-3 Fatty Acids (FISH OIL) 1200 MG CAPS Take 2 capsules by mouth 2 (two) times daily.     Marland Kitchen omeprazole (PRILOSEC) 20 MG capsule Take 20 mg by mouth daily.     Marland Kitchen oxyCODONE (OXY IR/ROXICODONE) 5 MG  immediate release tablet Take 1-2 tablets (5-10 mg total) by mouth every 4 (four) hours as needed for breakthrough pain. 60 tablet 0  . traMADol-acetaminophen (ULTRACET) 37.5-325 MG per tablet 1 tablet every 6 (six) hours as needed for moderate pain or severe pain.     . traZODone (DESYREL) 100 MG tablet Take 100 mg by mouth at bedtime.     . vitamin B-12 (CYANOCOBALAMIN) 1000 MCG tablet Take 1,000 mcg by mouth daily.    Marland Kitchen zolpidem (AMBIEN) 10 MG tablet Take 10 mg by mouth at bedtime.      No current facility-administered medications for this visit.  1 Significant History/PMH:   Right Shoulder Hemiarthroplasty: 28-Jul-2011   PVD - Peripheral Vascular Disease:    Hypertension:    tension headaches:    meralgia parensthetica:    hyperlipidemia:    degenerative disk disease with lumbar radiculopathy:    atrophic kidney:    atherosclerotic cardivascular disease:    arthritis:    anxiety disorder:    Cardiac Catherization: Jun 2006   MI - Myocardial Infarct: Jun 2006   Radical Prostatectomy: Dec 2011   knee surgery left knee: 2000   bilateral knee replacements: 09-Feb-2007   CABG: Jun 2006  Preventive Screening:  Has patient had any of the following test? Colonscopy  Prostate Exam   Last Colonoscopy: 2002   Last Prostate Exam: 2013   Smoking History: Smoking History quit in 2012; smoked occasional pipe; started smoking age 75-smoked 3/4 pack. day until 2012.  PFSH: Comments: Sister  had breast cancer..  Another sister had thyroid cancer.  Social History: negative alcohol, negative tobacco   OBJECTIVE:  Filed Vitals:   10/09/15 1430  BP: 124/78  Pulse: 69  Temp: 99.3 F (37.4 C)     Body mass index is 30.95 kg/(m^2).    ECOG FS:1 - Symptomatic but completely ambulatory  PHYSICAL EXAM:General  status: Performance status is good.  Patient has not lost significant weight HEENT: No evidence of stomatitis. Sclera and conjunctivae :: No jaundice.   pale  looking. Lungs: Air  entry equal on both sides.  No rhonchi.  No rales.  Cardiac: Heart sounds are normal.  No pericardial rub.  No murmur. Lymphatic system: Cervical, axillary, inguinal, lymph nodes not palpable GI: Abdomen is soft.  No ascites.  Liver spleen not palpable.  No tenderness.  Bowel sounds are within normal limit Lower extremity: No edema Neurological system: Higher functions, cranial nerves intact no evidence of peripheral neuropathy. Skin: No rash.  No ecchymosis.Marland Kitchen   LAB RESULTS:  Appointment on 10/09/2015  Component Date Value Ref Range Status  . WBC 10/09/2015 4.4  3.8 - 10.6 K/uL Final  . RBC 10/09/2015 4.50  4.40 - 5.90 MIL/uL Final  . Hemoglobin 10/09/2015 13.2  13.0 - 18.0  g/dL Final  . HCT 10/09/2015 39.6* 40.0 - 52.0 % Final  . MCV 10/09/2015 88.0  80.0 - 100.0 fL Final  . MCH 10/09/2015 29.2  26.0 - 34.0 pg Final  . MCHC 10/09/2015 33.2  32.0 - 36.0 g/dL Final  . RDW 10/09/2015 15.0* 11.5 - 14.5 % Final  . Platelets 10/09/2015 177  150 - 440 K/uL Final  . Neutrophils Relative % 10/09/2015 55   Final  . Neutro Abs 10/09/2015 2.4  1.4 - 6.5 K/uL Final  . Lymphocytes Relative 10/09/2015 29   Final  . Lymphs Abs 10/09/2015 1.3  1.0 - 3.6 K/uL Final  . Monocytes Relative 10/09/2015 12   Final  . Monocytes Absolute 10/09/2015 0.5  0.2 - 1.0 K/uL Final  . Eosinophils Relative 10/09/2015 3   Final  . Eosinophils Absolute 10/09/2015 0.1  0 - 0.7 K/uL Final  . Basophils Relative 10/09/2015 1   Final  . Basophils Absolute 10/09/2015 0.0  0 - 0.1 K/uL Final  . Sodium 10/09/2015 139  135 - 145 mmol/L Final  . Potassium 10/09/2015 4.3  3.5 - 5.1 mmol/L Final  . Chloride 10/09/2015 102  101 - 111 mmol/L Final  . CO2 10/09/2015 29  22 - 32 mmol/L Final  . Glucose, Bld 10/09/2015 107* 65 - 99 mg/dL Final  . BUN 10/09/2015 19  6 - 20 mg/dL Final  . Creatinine, Ser 10/09/2015 1.59* 0.61 - 1.24 mg/dL Final  . Calcium 10/09/2015 8.9  8.9 - 10.3 mg/dL Final  . Total Protein  10/09/2015 6.4* 6.5 - 8.1 g/dL Final  . Albumin 10/09/2015 3.6  3.5 - 5.0 g/dL Final  . AST 10/09/2015 45* 15 - 41 U/L Final  . ALT 10/09/2015 27  17 - 63 U/L Final  . Alkaline Phosphatase 10/09/2015 74  38 - 126 U/L Final  . Total Bilirubin 10/09/2015 0.6  0.3 - 1.2 mg/dL Final  . GFR calc non Af Amer 10/09/2015 41* >60 mL/min Final  . GFR calc Af Amer 10/09/2015 48* >60 mL/min Final   Comment: (NOTE) The eGFR has been calculated using the CKD EPI equation. This calculation has not been validated in all clinical situations. eGFR's persistently <60 mL/min signify possible Chronic Kidney Disease.   . Anion gap 10/09/2015 8  5 - 15 Final      STUDIES: Dg Chest 2 View  10/07/2015  CLINICAL DATA:  Status post right thoracotomy for squamous cell carcinoma 1 year ago. Followup. EXAM: CHEST  2 VIEW COMPARISON:  Apr 04, 2015 FINDINGS: The heart size and mediastinal contours are stable. Cardiac pacemaker is unchanged. The heart size is enlarged. The aorta is tortuous. Chronic scar is identified in the right lung base unchanged. There is no focal infiltrate, pulmonary edema, or pleural effusion. The lungs are hyperinflated. No pulmonary mass is identified. The visualized skeletal structures are stable. IMPRESSION: No active cardiopulmonary disease. Underlying emphysema and stable right lung base scar. Electronically Signed   By: Abelardo Diesel M.D.   On: 10/07/2015 17:30    ASSESSMENT: Carcinoma of lungs right lower lobe stage I status post resection and there is no evidence of recurrent or progressive disease on clinical examination Chest x-rays been reviewed independently.  There is no active cardiopulmonary disease.  Underlying emphysema.  No evidence of any progressive metastases. MEDICAL DECISION MAKING:  Lab data has been reviewed. Recent  left   shoulder surgery Cancer of  prostate being followed by urologist    Cancer of lung  Staging form: Lung, AJCC 7th Edition     Clinical: T1,  N0, M0 - Signed by Forest Gleason, MD on 04/12/2015   Forest Gleason, MD   10/10/2015 5:28 PM

## 2015-10-15 ENCOUNTER — Encounter: Payer: PPO | Admitting: Physical Therapy

## 2015-10-17 ENCOUNTER — Encounter: Payer: Self-pay | Admitting: *Deleted

## 2015-10-20 ENCOUNTER — Encounter: Payer: Self-pay | Admitting: *Deleted

## 2015-10-20 ENCOUNTER — Ambulatory Visit
Admission: RE | Admit: 2015-10-20 | Discharge: 2015-10-20 | Disposition: A | Discharge status: Home / Self Care | Payer: PPO | Source: Ambulatory Visit | Attending: Unknown Physician Specialty | Admitting: Unknown Physician Specialty

## 2015-10-20 ENCOUNTER — Ambulatory Visit: Payer: PPO | Admitting: Anesthesiology

## 2015-10-20 ENCOUNTER — Encounter
Admission: RE | Disposition: A | Discharge status: Home / Self Care | Payer: Self-pay | Source: Ambulatory Visit | Attending: Unknown Physician Specialty

## 2015-10-20 DIAGNOSIS — K219 Gastro-esophageal reflux disease without esophagitis: Secondary | ICD-10-CM | POA: Diagnosis not present

## 2015-10-20 DIAGNOSIS — E785 Hyperlipidemia, unspecified: Secondary | ICD-10-CM | POA: Diagnosis not present

## 2015-10-20 DIAGNOSIS — I129 Hypertensive chronic kidney disease with stage 1 through stage 4 chronic kidney disease, or unspecified chronic kidney disease: Secondary | ICD-10-CM | POA: Diagnosis not present

## 2015-10-20 DIAGNOSIS — D649 Anemia, unspecified: Secondary | ICD-10-CM | POA: Insufficient documentation

## 2015-10-20 DIAGNOSIS — I2510 Atherosclerotic heart disease of native coronary artery without angina pectoris: Secondary | ICD-10-CM | POA: Insufficient documentation

## 2015-10-20 DIAGNOSIS — Z86010 Personal history of colonic polyps: Secondary | ICD-10-CM | POA: Diagnosis present

## 2015-10-20 DIAGNOSIS — G4730 Sleep apnea, unspecified: Secondary | ICD-10-CM | POA: Diagnosis not present

## 2015-10-20 DIAGNOSIS — F419 Anxiety disorder, unspecified: Secondary | ICD-10-CM | POA: Insufficient documentation

## 2015-10-20 DIAGNOSIS — N183 Chronic kidney disease, stage 3 (moderate): Secondary | ICD-10-CM | POA: Insufficient documentation

## 2015-10-20 DIAGNOSIS — Z8546 Personal history of malignant neoplasm of prostate: Secondary | ICD-10-CM | POA: Diagnosis not present

## 2015-10-20 DIAGNOSIS — K5730 Diverticulosis of large intestine without perforation or abscess without bleeding: Secondary | ICD-10-CM | POA: Insufficient documentation

## 2015-10-20 DIAGNOSIS — Z950 Presence of cardiac pacemaker: Secondary | ICD-10-CM | POA: Diagnosis not present

## 2015-10-20 DIAGNOSIS — Z1211 Encounter for screening for malignant neoplasm of colon: Secondary | ICD-10-CM | POA: Diagnosis present

## 2015-10-20 DIAGNOSIS — D123 Benign neoplasm of transverse colon: Secondary | ICD-10-CM | POA: Diagnosis not present

## 2015-10-20 DIAGNOSIS — K640 First degree hemorrhoids: Secondary | ICD-10-CM | POA: Insufficient documentation

## 2015-10-20 DIAGNOSIS — M1990 Unspecified osteoarthritis, unspecified site: Secondary | ICD-10-CM | POA: Insufficient documentation

## 2015-10-20 DIAGNOSIS — Z79899 Other long term (current) drug therapy: Secondary | ICD-10-CM | POA: Insufficient documentation

## 2015-10-20 DIAGNOSIS — Z902 Acquired absence of lung [part of]: Secondary | ICD-10-CM | POA: Insufficient documentation

## 2015-10-20 DIAGNOSIS — M792 Neuralgia and neuritis, unspecified: Secondary | ICD-10-CM | POA: Insufficient documentation

## 2015-10-20 DIAGNOSIS — Z951 Presence of aortocoronary bypass graft: Secondary | ICD-10-CM | POA: Insufficient documentation

## 2015-10-20 DIAGNOSIS — Z85118 Personal history of other malignant neoplasm of bronchus and lung: Secondary | ICD-10-CM | POA: Insufficient documentation

## 2015-10-20 DIAGNOSIS — J449 Chronic obstructive pulmonary disease, unspecified: Secondary | ICD-10-CM | POA: Diagnosis not present

## 2015-10-20 DIAGNOSIS — D122 Benign neoplasm of ascending colon: Secondary | ICD-10-CM | POA: Insufficient documentation

## 2015-10-20 DIAGNOSIS — Z87891 Personal history of nicotine dependence: Secondary | ICD-10-CM | POA: Diagnosis not present

## 2015-10-20 DIAGNOSIS — I714 Abdominal aortic aneurysm, without rupture: Secondary | ICD-10-CM | POA: Diagnosis not present

## 2015-10-20 DIAGNOSIS — Z8673 Personal history of transient ischemic attack (TIA), and cerebral infarction without residual deficits: Secondary | ICD-10-CM | POA: Insufficient documentation

## 2015-10-20 DIAGNOSIS — Z8249 Family history of ischemic heart disease and other diseases of the circulatory system: Secondary | ICD-10-CM | POA: Diagnosis not present

## 2015-10-20 DIAGNOSIS — Z7982 Long term (current) use of aspirin: Secondary | ICD-10-CM | POA: Insufficient documentation

## 2015-10-20 DIAGNOSIS — Z803 Family history of malignant neoplasm of breast: Secondary | ICD-10-CM | POA: Insufficient documentation

## 2015-10-20 HISTORY — DX: Chronic kidney disease, stage 3 (moderate): N18.3

## 2015-10-20 HISTORY — DX: Other intervertebral disc degeneration, lumbar region: M51.36

## 2015-10-20 HISTORY — DX: Personal history of colonic polyps: Z86.010

## 2015-10-20 HISTORY — DX: Spondylolysis, lumbar region: M43.06

## 2015-10-20 HISTORY — DX: Atherosclerosis of coronary artery bypass graft(s) without angina pectoris: I25.810

## 2015-10-20 HISTORY — DX: Personal history of adenomatous and serrated colon polyps: Z86.0101

## 2015-10-20 HISTORY — DX: Chronic kidney disease, stage 3 unspecified: N18.30

## 2015-10-20 HISTORY — DX: Other intervertebral disc degeneration, lumbar region without mention of lumbar back pain or lower extremity pain: M51.369

## 2015-10-20 HISTORY — DX: Chronic tension-type headache, not intractable: G44.229

## 2015-10-20 HISTORY — DX: Personal history of diseases of the skin and subcutaneous tissue: Z87.2

## 2015-10-20 HISTORY — PX: COLONOSCOPY WITH PROPOFOL: SHX5780

## 2015-10-20 HISTORY — DX: Personal history of other diseases of the digestive system: Z87.19

## 2015-10-20 HISTORY — DX: Unspecified fracture of lower end of left humerus, initial encounter for closed fracture: S42.402A

## 2015-10-20 HISTORY — DX: Chronic obstructive pulmonary disease, unspecified: J44.9

## 2015-10-20 SURGERY — COLONOSCOPY WITH PROPOFOL
Anesthesia: General

## 2015-10-20 MED ORDER — SODIUM CHLORIDE 0.9 % IV SOLN
INTRAVENOUS | Status: DC
  Administered 2015-10-20: 13:00:00 via INTRAVENOUS

## 2015-10-20 MED ORDER — PIPERACILLIN-TAZOBACTAM 3.375 G IVPB 30 MIN
3.3750 g | Freq: Once | INTRAVENOUS | Status: AC
Start: 2015-10-20 — End: 2015-10-20
  Administered 2015-10-20: 3.375 g via INTRAVENOUS
  Filled 2015-10-20: qty 50

## 2015-10-20 MED ORDER — SODIUM CHLORIDE 0.9 % IV SOLN: INTRAVENOUS | Status: DC

## 2015-10-20 MED ORDER — LIDOCAINE HCL (PF) 2 % IJ SOLN
INTRAMUSCULAR | Status: DC | PRN
  Administered 2015-10-20: 50 mg

## 2015-10-20 MED ORDER — PROPOFOL 500 MG/50ML IV EMUL
INTRAVENOUS | Status: DC | PRN
  Administered 2015-10-20: 100 ug/kg/min via INTRAVENOUS

## 2015-10-20 MED ORDER — PHENYLEPHRINE HCL 10 MG/ML IJ SOLN
INTRAMUSCULAR | Status: DC | PRN
  Administered 2015-10-20 (×2): 100 ug via INTRAVENOUS

## 2015-10-20 MED ORDER — FENTANYL CITRATE (PF) 100 MCG/2ML IJ SOLN
INTRAMUSCULAR | Status: DC | PRN
  Administered 2015-10-20: 50 ug via INTRAVENOUS

## 2015-10-20 MED ORDER — PROPOFOL 10 MG/ML IV BOLUS
INTRAVENOUS | Status: DC | PRN
  Administered 2015-10-20: 30 mg via INTRAVENOUS
  Administered 2015-10-20: 20 mg via INTRAVENOUS

## 2015-10-20 NOTE — Transfer of Care (Signed)
Immediate Anesthesia Transfer of Care Note  Patient: Rick Mcbride.  Procedure(s) Performed: Procedure(s): COLONOSCOPY WITH PROPOFOL (N/A)  Patient Location: PACU  Anesthesia Type:General  Level of Consciousness: sedated  Airway & Oxygen Therapy: Patient Spontanous Breathing and Patient connected to nasal cannula oxygen  Post-op Assessment: Report given to RN and Post -op Vital signs reviewed and stable  Post vital signs: Reviewed and stable  Last Vitals:  Filed Vitals:   10/20/15 1230  BP: 162/117  Pulse: 78  Temp: 37.3 C  Resp: 20    Complications: No apparent anesthesia complications

## 2015-10-20 NOTE — Anesthesia Preprocedure Evaluation (Addendum)
Anesthesia Evaluation  Patient identified by MRN, date of birth, ID band Patient awake    Reviewed: Allergy & Precautions, H&P , NPO status , Patient's Chart, lab work & pertinent test results  History of Anesthesia Complications Negative for: history of anesthetic complications  Airway Mallampati: III  TM Distance: >3 FB Neck ROM: full    Dental  (+) Poor Dentition, Missing, Upper Dentures, Lower Dentures   Pulmonary neg shortness of breath, sleep apnea , COPD, former smoker,    Pulmonary exam normal breath sounds clear to auscultation       Cardiovascular Exercise Tolerance: Good hypertension, (-) angina+ CAD, + CABG, + Peripheral Vascular Disease and +CHF (EF 40%)  (-) DOE Normal cardiovascular exam+ dysrhythmias + pacemaker + Valvular Problems/Murmurs MR  Rhythm:regular Rate:Normal     Neuro/Psych  Headaches, PSYCHIATRIC DISORDERS Anxiety TIA Neuromuscular disease CVA    GI/Hepatic Neg liver ROS, GERD  Controlled,  Endo/Other  negative endocrine ROS  Renal/GU Renal disease  negative genitourinary   Musculoskeletal  (+) Arthritis ,   Abdominal   Peds  Hematology negative hematology ROS (+)   Anesthesia Other Findings Past Medical History:   Heart disease                                                GERD (gastroesophageal reflux disease)                       Hyperlipidemia                                               Sleep apnea                                                  Hypertension                                                 Neuralgia                                                    Hypercholesteremia                                           Sinoatrial node dysfunction (HCC)                            DDD (degenerative disc disease), lumbar                      Meralgia paresthetica  Osteoarthritis                                               Anxiety                                                       Anemia                                                       Iliac aneurysm (HCC)                                         Iliac aneurysm (HCC)                                         Prostate cancer ([place])                                        Lung cancer ([place])                                            AAA (abdominal aortic aneurysm) ([place])                        Lung cancer ([place])                                            TIA (transient ischemic attack)                              Cervical radiculopathy                                       Stroke [facility])                                                 Coronary artery disease                                      Presence of permanent cardiac pacemaker                      AAA (abdominal aortic aneurysm) without ruptur*  Chronic airway obstruction (HCC)                             Atrophic kidney                                              Chronic kidney disease (CKD), stage III (moder*                Comment:followed by Dr. Johnny Bridge   Headache                                                     Chronic tension headaches                                    Coronary atherosclerosis of autologous vein by*              Degenerative disc disease, lumbar                              Comment:with lumbar radiculopathy   H/O adenomatous polyp of colon                               H/O hemorrhoids                                              H/O urticaria                                                Iliac aneurysm ([place])                                           Comment:followed by Dr. 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 Nehemiah Massed  Past Surgical History:   coronary atherosclerosis of autologous vein by*               KNEE ARTHROSCOPY                                              PROSTATECTOMY                                                 TOTAL SHOULDER REPLACEMENT                                    COLONOSCOPY                                                   POLYPECTOMY                                                   PACEMAKER INSERTION                                           LOBECTOMY                                        01/31/13        Comment:RLL w/squamous cell carcinoma lobectomy   TOTAL KNEE ARTHROPLASTY                         Bilateral              CATARACT EXTRACTION                                           JOINT REPLACEMENT                                             EYE SURGERY                                     Bilateral  Comment:cataract extraction   TOTAL SHOULDER ARTHROPLASTY                     Left 07/10/2015       Comment:Procedure: TOTAL SHOULDER ARTHROPLASTY;                Surgeon: [name], MD;  Location: [facility] ORS;              Service: Orthopedics;  Laterality: Left;   CORONARY ARTERY BYPASS GRAFT                                    Comment:triple   COLONOSCOPY                                                   PACEMAKER INSERTION                              12/2012         Comment:Dual chanber pacemaker generator  BMI    Body Mass Index   30.83 kg/m 2    Patient has cardiac clearance for this procedure.    Reproductive/Obstetrics negative OB ROS                            Anesthesia Physical Anesthesia Plan  ASA: IV  Anesthesia Plan: General   Post-op Pain Management:    Induction:   Airway Management Planned:   Additional Equipment:   Intra-op Plan:   Post-operative Plan:   Informed Consent: I have  reviewed the patients History and Physical, chart, labs and discussed the procedure including the risks, benefits and alternatives for the proposed anesthesia with the patient or authorized representative who has indicated his/her understanding and acceptance.   Dental Advisory Given  Plan Discussed with: Anesthesiologist, CRNA and Surgeon  Anesthesia Plan Comments:         Anesthesia Quick Evaluation

## 2015-10-20 NOTE — Op Note (Signed)
[facility] Gastroenterology Patient Name: Rick Mcbride Procedure Date: 10/20/2015 1:11 PM MRN: [id number] Account #: [id number] Date of Birth: Nov 13, 1940 Admit Type: Outpatient Age: 74 Room: [facility] ENDO ROOM 1 Gender: Male Note Status: Finalized Procedure:         Colonoscopy Indications:       High risk colon cancer surveillance: Personal history of                     colonic polyps Providers:         Manya Silvas, MD Referring MD:      Leonie Douglas. Doy Hutching, MD (Referring MD) Medicines:         Propofol per Anesthesia Complications:     No immediate complications. Procedure:         Pre-Anesthesia Assessment:                    - After reviewing the risks and benefits, the patient was                     deemed in satisfactory condition to undergo the procedure.                    After obtaining informed consent, the colonoscope was                     passed under direct vision. Throughout the procedure, the                     patient's blood pressure, pulse, and oxygen saturations                     were monitored continuously. The Colonoscope was                     introduced through the anus and advanced to the the cecum,                     identified by appendiceal orifice and ileocecal valve. The                     colonoscopy was performed without difficulty. The patient                     tolerated the procedure well. The quality of the bowel                     preparation was good. Findings:      A diminutive polyp was found in the proximal ascending colon. The polyp       was sessile. The polyp was removed with a cold snare. Resection and       retrieval were complete.      Three sessile polyps were found in the transverse colon. The polyps were       diminutive in size. These polyps were removed with a jumbo cold forceps.       Resection and retrieval were complete.      A small polyp was found in the transverse colon. The polyp was  sessile.       The polyp was removed with a hot snare. Resection and retrieval were       complete.      A few small-mouthed diverticula were found in the sigmoid colon.      Internal hemorrhoids  were found during endoscopy. The hemorrhoids were       small and Grade I (internal hemorrhoids that do not prolapse). Impression:        - One diminutive polyp in the proximal ascending colon.                     Resected and retrieved.                    - Three diminutive polyps in the transverse colon.                     Resected and retrieved.                    - One small polyp in the transverse colon. Resected and                     retrieved.                    - Diverticulosis in the sigmoid colon.                    - Internal hemorrhoids. Recommendation:    - Await pathology results. Manya Silvas, MD 10/20/2015 1:59:01 PM This report has been signed electronically. Number of Addenda: 0 Note Initiated On: 10/20/2015 1:11 PM Scope Withdrawal Time: 0 hours 26 minutes 54 seconds  Total Procedure Duration: 0 hours 38 minutes 1 second       [facility]

## 2015-10-20 NOTE — Anesthesia Postprocedure Evaluation (Signed)
Anesthesia Post Note  Patient: Rick Mcbride.  Procedure(s) Performed: Procedure(s) (LRB): COLONOSCOPY WITH PROPOFOL (N/A)  Patient location during evaluation: Endoscopy Anesthesia Type: General Level of consciousness: awake and alert Pain management: pain level controlled Vital Signs Assessment: post-procedure vital signs reviewed and stable Respiratory status: spontaneous breathing, nonlabored ventilation, respiratory function stable and patient connected to nasal cannula oxygen Cardiovascular status: blood pressure returned to baseline and stable Postop Assessment: no signs of nausea or vomiting Anesthetic complications: no    Last Vitals:  Filed Vitals:   10/20/15 1420 10/20/15 1430  BP: 124/78 123/64  Pulse: 63   Temp:    Resp: 15     Last Pain: There were no vitals filed for this visit.               Precious Haws Miran Kautzman

## 2015-10-20 NOTE — H&P (Signed)
Primary Care Physician:  Idelle Crouch, MD Primary Gastroenterologist:  Dr. Vira Agar  Pre-Procedure History & Physical: HPI:  Rick Mcbride. is a 75 y.o. male is here for an colonoscopy.   Past Medical History  Diagnosis Date  . Heart disease   . GERD (gastroesophageal reflux disease)   . Hyperlipidemia   . Sleep apnea   . Hypertension   . Neuralgia   . Hypercholesteremia   . Sinoatrial node dysfunction (HCC)   . DDD (degenerative disc disease), lumbar   . Meralgia paresthetica   . Osteoarthritis   . Anxiety   . Anemia   . Iliac aneurysm ([place])   . Iliac aneurysm ([place])   . Prostate cancer ([place])   . Lung cancer ([place])   . AAA (abdominal aortic aneurysm) ([place])   . Lung cancer ([place])   . TIA (transient ischemic attack)   . Cervical radiculopathy   . Stroke ([place])   . Coronary artery disease   . Presence of permanent cardiac pacemaker   . AAA (abdominal aortic aneurysm) without rupture ([place])   . Chronic airway obstruction ([place])   . Atrophic kidney   . Chronic kidney disease (CKD), stage III (moderate)     followed by Dr. Johnny Bridge  . Headache   . Chronic tension headaches   . Coronary atherosclerosis of autologous vein bypass graft   . Degenerative disc disease, lumbar     with lumbar radiculopathy  . H/O adenomatous polyp of colon   . H/O hemorrhoids   . H/O urticaria   . Iliac aneurysm ([place])     followed by Dr. Lucky Cowboy  . Elbow fracture, left   . Meralgia paresthetica   . Osteoarthritis     s/p L knee surgery  . Pars defect of lumbar spine     L5 bilat w/anteriolisthesis  . Second degree AV block     Followed by Dr. Nehemiah Massed    Past Surgical History  Procedure Laterality Date  . Coronary atherosclerosis of autologous vein bypass graft    . Knee arthroscopy    . Prostatectomy    . Total shoulder replacement    . Colonoscopy    . Polypectomy    . Pacemaker insertion    . Lobectomy  01/31/13    RLL w/squamous cell carcinoma lobectomy  . Total knee  arthroplasty Bilateral   . Cataract extraction    . Joint replacement    . Eye surgery Bilateral     cataract extraction  . Total shoulder arthroplasty Left 07/10/2015    Procedure: TOTAL SHOULDER ARTHROPLASTY;  Surgeon: [name], MD;  Location: [facility] ORS;  Service: Orthopedics;  Laterality: Left;  . Coronary artery bypass graft      triple  . Colonoscopy    . Pacemaker insertion  12/2012    Dual chanber pacemaker generator    Prior to Admission medications   Medication Sig Start Date End Date Taking? Authorizing Provider  aspirin EC 81 MG tablet Take 81 mg by mouth daily.    Yes Historical Provider, MD  carisoprodol (SOMA) 350 MG tablet Take 350 mg by mouth 4 (four) times daily as needed for muscle spasms.   Yes Historical Provider, MD  clopidogrel (PLAVIX) 75 MG tablet Take 75 mg by mouth daily.  10/30/14  Yes Historical Provider, MD  clotrimazole-betamethasone (LOTRISONE) cream Apply 1 application topically 2 (two) times daily.   Yes Historical Provider, MD  clotrimazole-betamethasone (LOTRISONE) lotion Apply topically 2 (two) times daily as  needed.   Yes Historical Provider, MD  docusate sodium (COLACE) 100 MG capsule Take 100 mg by mouth daily.   Yes Historical Provider, MD  atorvastatin (LIPITOR) 40 MG tablet Take 40 mg by mouth daily at 6 PM.  10/30/14   Historical Provider, MD  Calcium Carb-Cholecalciferol (CALCIUM 600 + D PO) Take 1 tablet by mouth daily.    Historical Provider, MD  cetirizine (ZYRTEC) 10 MG tablet Take 10 mg by mouth daily.    Historical Provider, MD  cyanocobalamin 1000 MCG tablet Take 100 mcg by mouth daily.    Historical Provider, MD  donepezil (ARICEPT) 5 MG tablet Take 5 mg by mouth at bedtime.    Historical Provider, MD  ferrous sulfate (SLOW FE) 160 (50 FE) MG TBCR SR tablet Take 1 tablet by mouth 2 (two) times daily.     Historical Provider, MD  lisinopril (PRINIVIL,ZESTRIL) 5 MG tablet Take 5 mg by mouth daily.  10/30/14   Historical Provider, MD   Multiple Vitamins-Minerals (CENTRUM SILVER ULTRA MENS) TABS Take 1 tablet by mouth daily.     Historical Provider, MD  Multiple Vitamins-Minerals (PRESERVISION/LUTEIN) CAPS Take 1 capsule by mouth 2 (two) times daily.     Historical Provider, MD  niacin (NIASPAN) 500 MG CR tablet Take 500 mg by mouth daily.  10/30/14   Historical Provider, MD  Omega-3 Fatty Acids (FISH OIL) 1200 MG CAPS Take 2 capsules by mouth 2 (two) times daily.     Historical Provider, MD  omeprazole (PRILOSEC) 20 MG capsule Take 20 mg by mouth daily.  10/30/14   Historical Provider, MD  oxyCODONE (OXY IR/ROXICODONE) 5 MG immediate release tablet Take 1-2 tablets (5-10 mg total) by mouth every 4 (four) hours as needed for breakthrough pain. 07/11/15   Lattie Corns, PA-C  traMADol-acetaminophen (ULTRACET) 37.5-325 MG per tablet 1 tablet every 6 (six) hours as needed for moderate pain or severe pain.  01/12/15   Historical Provider, MD  traZODone (DESYREL) 100 MG tablet Take 100 mg by mouth at bedtime.  10/30/14   Historical Provider, MD  vitamin B-12 (CYANOCOBALAMIN) 1000 MCG tablet Take 1,000 mcg by mouth daily.    Historical Provider, MD  zolpidem (AMBIEN) 10 MG tablet Take 10 mg by mouth at bedtime.  10/30/14   Historical Provider, MD    Allergies as of 08/21/2015  . (No Known Allergies)    Family History  Problem Relation Age of Onset  . Heart attack Mother   . Heart attack Father   . Breast cancer Sister   . Asthma Sister     Social History   Social History  . Marital Status: Married    Spouse Name: N/A  . Number of Children: 2  . Years of Education: 16   Occupational History  . retired     pt was an Chief Financial Officer   Social History Main Topics  . Smoking status: Former Smoker -- 1.50 packs/day for 45 years    Quit date: 04/07/2004  . Smokeless tobacco: Never Used  . Alcohol Use: No  . Drug Use: No  . Sexual Activity: Not on file   Other Topics Concern  . Not on file   Social History Narrative    Patient drinks 4-5 cups of caffeine daily.   Patient is right handed.    Review of Systems: See HPI, otherwise negative ROS  Physical Exam: BP 162/117 mmHg  Pulse 78  Temp(Src) 99.2 F (37.3 C) (Tympanic)  Resp 20  Ht 5'  11" (1.803 m)  Wt 100.245 kg (221 lb)  BMI 30.84 kg/m2  SpO2 99% General:   Alert,  pleasant and cooperative in NAD Head:  Normocephalic and atraumatic. Neck:  Supple; no masses or thyromegaly. Lungs:  Clear throughout to auscultation.    Heart:  Regular rate and rhythm. Abdomen:  Soft, nontender and nondistended. Normal bowel sounds, without guarding, and without rebound.   Neurologic:  Alert and  oriented x4;  grossly normal neurologically.  Impression/Plan: Rick Mcbride. is here for an colonoscopy to be performed for follow up previous large polyp removed piecemeal  Risks, benefits, limitations, and alternatives regarding  colonoscopy have been reviewed with the patient.  Questions have been answered.  All parties agreeable.   Gaylyn Cheers, MD  10/20/2015, 1:10 PM

## 2015-10-22 ENCOUNTER — Encounter: Payer: PPO | Admitting: Physical Therapy

## 2015-10-22 ENCOUNTER — Encounter: Payer: Self-pay | Admitting: Unknown Physician Specialty

## 2015-10-22 LAB — SURGICAL PATHOLOGY

## 2015-11-18 DIAGNOSIS — G459 Transient cerebral ischemic attack, unspecified: Secondary | ICD-10-CM | POA: Diagnosis not present

## 2015-11-18 DIAGNOSIS — I639 Cerebral infarction, unspecified: Secondary | ICD-10-CM | POA: Diagnosis not present

## 2015-11-18 DIAGNOSIS — I714 Abdominal aortic aneurysm, without rupture: Secondary | ICD-10-CM | POA: Diagnosis not present

## 2015-11-25 DIAGNOSIS — R05 Cough: Secondary | ICD-10-CM | POA: Diagnosis not present

## 2015-12-30 DIAGNOSIS — N183 Chronic kidney disease, stage 3 (moderate): Secondary | ICD-10-CM | POA: Diagnosis not present

## 2015-12-30 DIAGNOSIS — I25810 Atherosclerosis of coronary artery bypass graft(s) without angina pectoris: Secondary | ICD-10-CM | POA: Diagnosis not present

## 2015-12-30 DIAGNOSIS — I495 Sick sinus syndrome: Secondary | ICD-10-CM | POA: Diagnosis not present

## 2015-12-30 DIAGNOSIS — I519 Heart disease, unspecified: Secondary | ICD-10-CM | POA: Diagnosis not present

## 2015-12-30 DIAGNOSIS — E782 Mixed hyperlipidemia: Secondary | ICD-10-CM | POA: Diagnosis not present

## 2015-12-30 DIAGNOSIS — E7800 Pure hypercholesterolemia, unspecified: Secondary | ICD-10-CM | POA: Diagnosis not present

## 2015-12-30 DIAGNOSIS — K219 Gastro-esophageal reflux disease without esophagitis: Secondary | ICD-10-CM | POA: Diagnosis not present

## 2015-12-30 DIAGNOSIS — I10 Essential (primary) hypertension: Secondary | ICD-10-CM | POA: Diagnosis not present

## 2015-12-30 DIAGNOSIS — I441 Atrioventricular block, second degree: Secondary | ICD-10-CM | POA: Diagnosis not present

## 2016-01-06 DIAGNOSIS — M5137 Other intervertebral disc degeneration, lumbosacral region: Secondary | ICD-10-CM | POA: Diagnosis not present

## 2016-01-06 DIAGNOSIS — M5416 Radiculopathy, lumbar region: Secondary | ICD-10-CM | POA: Diagnosis not present

## 2016-01-06 DIAGNOSIS — Z7982 Long term (current) use of aspirin: Secondary | ICD-10-CM | POA: Diagnosis not present

## 2016-01-06 DIAGNOSIS — Z79899 Other long term (current) drug therapy: Secondary | ICD-10-CM | POA: Diagnosis not present

## 2016-01-08 DIAGNOSIS — J300 Vasomotor rhinitis: Secondary | ICD-10-CM | POA: Diagnosis not present

## 2016-01-08 DIAGNOSIS — G629 Polyneuropathy, unspecified: Secondary | ICD-10-CM | POA: Diagnosis not present

## 2016-01-08 DIAGNOSIS — M5137 Other intervertebral disc degeneration, lumbosacral region: Secondary | ICD-10-CM | POA: Diagnosis not present

## 2016-01-20 DIAGNOSIS — L02821 Furuncle of head [any part, except face]: Secondary | ICD-10-CM | POA: Diagnosis not present

## 2016-02-05 DIAGNOSIS — J069 Acute upper respiratory infection, unspecified: Secondary | ICD-10-CM | POA: Diagnosis not present

## 2016-02-09 DIAGNOSIS — H353132 Nonexudative age-related macular degeneration, bilateral, intermediate dry stage: Secondary | ICD-10-CM | POA: Diagnosis not present

## 2016-03-19 DIAGNOSIS — N2581 Secondary hyperparathyroidism of renal origin: Secondary | ICD-10-CM | POA: Diagnosis not present

## 2016-03-19 DIAGNOSIS — I129 Hypertensive chronic kidney disease with stage 1 through stage 4 chronic kidney disease, or unspecified chronic kidney disease: Secondary | ICD-10-CM | POA: Diagnosis not present

## 2016-03-19 DIAGNOSIS — D631 Anemia in chronic kidney disease: Secondary | ICD-10-CM | POA: Diagnosis not present

## 2016-03-19 DIAGNOSIS — N183 Chronic kidney disease, stage 3 (moderate): Secondary | ICD-10-CM | POA: Diagnosis not present

## 2016-03-19 DIAGNOSIS — N2889 Other specified disorders of kidney and ureter: Secondary | ICD-10-CM | POA: Diagnosis not present

## 2016-03-26 DIAGNOSIS — R809 Proteinuria, unspecified: Secondary | ICD-10-CM | POA: Diagnosis not present

## 2016-03-26 DIAGNOSIS — I129 Hypertensive chronic kidney disease with stage 1 through stage 4 chronic kidney disease, or unspecified chronic kidney disease: Secondary | ICD-10-CM | POA: Diagnosis not present

## 2016-03-26 DIAGNOSIS — N2581 Secondary hyperparathyroidism of renal origin: Secondary | ICD-10-CM | POA: Diagnosis not present

## 2016-03-26 DIAGNOSIS — N183 Chronic kidney disease, stage 3 (moderate): Secondary | ICD-10-CM | POA: Diagnosis not present

## 2016-04-23 DIAGNOSIS — Z125 Encounter for screening for malignant neoplasm of prostate: Secondary | ICD-10-CM | POA: Diagnosis not present

## 2016-04-23 DIAGNOSIS — R7309 Other abnormal glucose: Secondary | ICD-10-CM | POA: Diagnosis not present

## 2016-04-23 DIAGNOSIS — E782 Mixed hyperlipidemia: Secondary | ICD-10-CM | POA: Diagnosis not present

## 2016-04-23 DIAGNOSIS — Z79899 Other long term (current) drug therapy: Secondary | ICD-10-CM | POA: Diagnosis not present

## 2016-04-27 DIAGNOSIS — D6489 Other specified anemias: Secondary | ICD-10-CM | POA: Diagnosis not present

## 2016-04-27 DIAGNOSIS — I10 Essential (primary) hypertension: Secondary | ICD-10-CM | POA: Diagnosis not present

## 2016-04-27 DIAGNOSIS — R739 Hyperglycemia, unspecified: Secondary | ICD-10-CM | POA: Diagnosis not present

## 2016-04-27 DIAGNOSIS — E782 Mixed hyperlipidemia: Secondary | ICD-10-CM | POA: Diagnosis not present

## 2016-04-27 DIAGNOSIS — N183 Chronic kidney disease, stage 3 (moderate): Secondary | ICD-10-CM | POA: Diagnosis not present

## 2016-04-27 DIAGNOSIS — G894 Chronic pain syndrome: Secondary | ICD-10-CM | POA: Diagnosis not present

## 2016-04-27 DIAGNOSIS — Z79899 Other long term (current) drug therapy: Secondary | ICD-10-CM | POA: Diagnosis not present

## 2016-04-27 DIAGNOSIS — R413 Other amnesia: Secondary | ICD-10-CM | POA: Diagnosis not present

## 2016-05-19 ENCOUNTER — Other Ambulatory Visit: Payer: Self-pay | Admitting: Internal Medicine

## 2016-05-19 DIAGNOSIS — M5440 Lumbago with sciatica, unspecified side: Secondary | ICD-10-CM

## 2016-05-21 DIAGNOSIS — I639 Cerebral infarction, unspecified: Secondary | ICD-10-CM | POA: Diagnosis not present

## 2016-05-21 DIAGNOSIS — I714 Abdominal aortic aneurysm, without rupture: Secondary | ICD-10-CM | POA: Diagnosis not present

## 2016-05-21 DIAGNOSIS — G459 Transient cerebral ischemic attack, unspecified: Secondary | ICD-10-CM | POA: Diagnosis not present

## 2016-06-04 ENCOUNTER — Ambulatory Visit
Admission: RE | Admit: 2016-06-04 | Discharge: 2016-06-04 | Disposition: A | Discharge status: Home / Self Care | Payer: PPO | Source: Ambulatory Visit | Attending: Internal Medicine | Admitting: Internal Medicine

## 2016-06-04 DIAGNOSIS — M5136 Other intervertebral disc degeneration, lumbar region: Secondary | ICD-10-CM | POA: Diagnosis not present

## 2016-06-04 DIAGNOSIS — M5440 Lumbago with sciatica, unspecified side: Secondary | ICD-10-CM | POA: Insufficient documentation

## 2016-06-04 DIAGNOSIS — I714 Abdominal aortic aneurysm, without rupture: Secondary | ICD-10-CM | POA: Insufficient documentation

## 2016-06-29 DIAGNOSIS — I495 Sick sinus syndrome: Secondary | ICD-10-CM | POA: Diagnosis not present

## 2016-06-29 DIAGNOSIS — K219 Gastro-esophageal reflux disease without esophagitis: Secondary | ICD-10-CM | POA: Diagnosis not present

## 2016-06-29 DIAGNOSIS — E782 Mixed hyperlipidemia: Secondary | ICD-10-CM | POA: Diagnosis not present

## 2016-06-29 DIAGNOSIS — I10 Essential (primary) hypertension: Secondary | ICD-10-CM | POA: Diagnosis not present

## 2016-06-29 DIAGNOSIS — I441 Atrioventricular block, second degree: Secondary | ICD-10-CM | POA: Diagnosis not present

## 2016-06-29 DIAGNOSIS — I519 Heart disease, unspecified: Secondary | ICD-10-CM | POA: Diagnosis not present

## 2016-06-29 DIAGNOSIS — I25810 Atherosclerosis of coronary artery bypass graft(s) without angina pectoris: Secondary | ICD-10-CM | POA: Diagnosis not present

## 2016-06-29 DIAGNOSIS — N183 Chronic kidney disease, stage 3 (moderate): Secondary | ICD-10-CM | POA: Diagnosis not present

## 2016-07-02 DIAGNOSIS — M5137 Other intervertebral disc degeneration, lumbosacral region: Secondary | ICD-10-CM | POA: Diagnosis not present

## 2016-07-02 DIAGNOSIS — M5136 Other intervertebral disc degeneration, lumbar region: Secondary | ICD-10-CM | POA: Diagnosis not present

## 2016-07-02 DIAGNOSIS — M545 Low back pain: Secondary | ICD-10-CM | POA: Diagnosis not present

## 2016-07-02 DIAGNOSIS — M5416 Radiculopathy, lumbar region: Secondary | ICD-10-CM | POA: Diagnosis not present

## 2016-07-08 DIAGNOSIS — I442 Atrioventricular block, complete: Secondary | ICD-10-CM | POA: Diagnosis not present

## 2016-07-23 DIAGNOSIS — M5136 Other intervertebral disc degeneration, lumbar region: Secondary | ICD-10-CM | POA: Diagnosis not present

## 2016-07-23 DIAGNOSIS — M4806 Spinal stenosis, lumbar region: Secondary | ICD-10-CM | POA: Diagnosis not present

## 2016-07-23 DIAGNOSIS — M5416 Radiculopathy, lumbar region: Secondary | ICD-10-CM | POA: Diagnosis not present

## 2016-08-09 DIAGNOSIS — H43813 Vitreous degeneration, bilateral: Secondary | ICD-10-CM | POA: Diagnosis not present

## 2016-08-09 DIAGNOSIS — H353132 Nonexudative age-related macular degeneration, bilateral, intermediate dry stage: Secondary | ICD-10-CM | POA: Diagnosis not present

## 2016-08-13 DIAGNOSIS — M5136 Other intervertebral disc degeneration, lumbar region: Secondary | ICD-10-CM | POA: Diagnosis not present

## 2016-08-13 DIAGNOSIS — M48062 Spinal stenosis, lumbar region with neurogenic claudication: Secondary | ICD-10-CM | POA: Diagnosis not present

## 2016-08-13 DIAGNOSIS — M5416 Radiculopathy, lumbar region: Secondary | ICD-10-CM | POA: Diagnosis not present

## 2016-08-23 DIAGNOSIS — B354 Tinea corporis: Secondary | ICD-10-CM | POA: Diagnosis not present

## 2016-08-23 DIAGNOSIS — D225 Melanocytic nevi of trunk: Secondary | ICD-10-CM | POA: Diagnosis not present

## 2016-08-23 DIAGNOSIS — X32XXXA Exposure to sunlight, initial encounter: Secondary | ICD-10-CM | POA: Diagnosis not present

## 2016-08-23 DIAGNOSIS — Z85828 Personal history of other malignant neoplasm of skin: Secondary | ICD-10-CM | POA: Diagnosis not present

## 2016-08-23 DIAGNOSIS — B351 Tinea unguium: Secondary | ICD-10-CM | POA: Diagnosis not present

## 2016-08-23 DIAGNOSIS — L570 Actinic keratosis: Secondary | ICD-10-CM | POA: Diagnosis not present

## 2016-09-03 DIAGNOSIS — M5416 Radiculopathy, lumbar region: Secondary | ICD-10-CM | POA: Diagnosis not present

## 2016-09-03 DIAGNOSIS — M5136 Other intervertebral disc degeneration, lumbar region: Secondary | ICD-10-CM | POA: Diagnosis not present

## 2016-09-03 DIAGNOSIS — M48062 Spinal stenosis, lumbar region with neurogenic claudication: Secondary | ICD-10-CM | POA: Diagnosis not present

## 2016-09-29 DIAGNOSIS — N183 Chronic kidney disease, stage 3 (moderate): Secondary | ICD-10-CM | POA: Diagnosis not present

## 2016-09-29 DIAGNOSIS — N2581 Secondary hyperparathyroidism of renal origin: Secondary | ICD-10-CM | POA: Diagnosis not present

## 2016-09-29 DIAGNOSIS — N2889 Other specified disorders of kidney and ureter: Secondary | ICD-10-CM | POA: Diagnosis not present

## 2016-09-29 DIAGNOSIS — I129 Hypertensive chronic kidney disease with stage 1 through stage 4 chronic kidney disease, or unspecified chronic kidney disease: Secondary | ICD-10-CM | POA: Diagnosis not present

## 2016-09-29 DIAGNOSIS — D631 Anemia in chronic kidney disease: Secondary | ICD-10-CM | POA: Diagnosis not present

## 2016-10-05 DIAGNOSIS — R809 Proteinuria, unspecified: Secondary | ICD-10-CM | POA: Diagnosis not present

## 2016-10-05 DIAGNOSIS — I129 Hypertensive chronic kidney disease with stage 1 through stage 4 chronic kidney disease, or unspecified chronic kidney disease: Secondary | ICD-10-CM | POA: Diagnosis not present

## 2016-10-05 DIAGNOSIS — N2581 Secondary hyperparathyroidism of renal origin: Secondary | ICD-10-CM | POA: Diagnosis not present

## 2016-10-05 DIAGNOSIS — N183 Chronic kidney disease, stage 3 (moderate): Secondary | ICD-10-CM | POA: Diagnosis not present

## 2016-10-06 ENCOUNTER — Ambulatory Visit
Admission: RE | Admit: 2016-10-06 | Discharge: 2016-10-06 | Disposition: A | Discharge status: Home / Self Care | Payer: PPO | Source: Ambulatory Visit | Attending: Oncology | Admitting: Oncology

## 2016-10-06 ENCOUNTER — Other Ambulatory Visit: Payer: Self-pay | Admitting: *Deleted

## 2016-10-06 DIAGNOSIS — Z951 Presence of aortocoronary bypass graft: Secondary | ICD-10-CM | POA: Diagnosis not present

## 2016-10-06 DIAGNOSIS — Z85118 Personal history of other malignant neoplasm of bronchus and lung: Secondary | ICD-10-CM

## 2016-10-06 DIAGNOSIS — Z950 Presence of cardiac pacemaker: Secondary | ICD-10-CM | POA: Diagnosis not present

## 2016-10-06 DIAGNOSIS — C3490 Malignant neoplasm of unspecified part of unspecified bronchus or lung: Secondary | ICD-10-CM

## 2016-10-10 NOTE — Progress Notes (Signed)
[facility] day:  10/11/2016  Chief Complaint: Rick Mcbride. is a 75 y.o. 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  HPI:  The patient underwent robotic prostatectomy on 10/26/2010 at [facility] in [place], [place].  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  Tumor was limited to a few small foci involving both lobes.  Surgical margins were negative.  Pathologic stage was T2cNx.  PSA was < 0.01 on 04/23/2016.  Following Dr. Alinda Money, urologist, at [facility] in [place].  He has yearly physicals and CXR.  He states that Dr. Doy Hutching found a spot on the lung.  Chest CT on 11/16/2012 revealed a 1.4 x 1.9 cm subpleural spiculated nodular density.  PET scan on 01/16/2013 revealed abnormal localization within the right lower lobe concerning for underlying malignancy.  There was no mediastinal or hilar abnormal uptake to suggest metastatic disease  He underwent right lower lobe wedge resection on 01/31/2013 by Dr Nestor Lewandowsky.  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 The subcarinal lymph node was negative for malignancy.  Right lower lobe lobectomy revealed a focal residual squamous cell carcinoma (0.1 cm) adjacent to the staple line.  There was no pleural invasion. There was no lymphovascular invasion.  There were 3 lobar lymph nodes negative for malignancy. Pathologic stage was T1bN0.  Colonoscopy on 02/07/2014 revealed a hepatic flexure polyp.  Pathology noted a tubular adenoma negative for high-grade dysplasia or malignancy.  Colonoscopy on 10/20/2015 revealed multiple polyps in the transverse colon and proximal ascending colon. There were 2 tubular adenomas and 3 hyperplastic polyps. All polyps were negative for high-grade dysplasia or malignancy.   Next colonoscopy is planned in 2019 by Dr. Vira Agar.   He underwent left shoulder arthroplasty on 07/10/2015 by Dr. Milagros Evener for degenerative joint disease.  Left humeral head resection revealed osteoarthrosis, trilineage hematopoiesis, and no evidence of malignancy. Shoulder is doing well.  He has had a partial replacement on right.  PET scan on 10/24/2014 revealed right lower lobe surgical changes without evidence of hypermetabolic recurrent or metastatic disease. The lingula nodule been present and similar back to 09/24/2013.  He had a 4.5 x 4.3 cm infrarenal abdominal aortic aneurysm extension into the right common iliac artery unchanged.  CXR on 10/06/2016 revealed no active disease was pleural and parenchymal pulmonary scarring on the right.  Dr. Lucky Cowboy is monitoring the aneurysm.  Patient was last seen by Dr. Oliva Bustard on 10/09/2015.  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  He had presented with confusion.  He notes difficulty with his short term memory.  Symptomatically, he feels good except for his back.  He notes that the bottom of his vertebra are 30 out of alignment. He was referred to Dr. Arnoldo Morale, a neurosurgeon, in [place].  He went to Dr. Nila Nephew.  He received 3 injections without improvement. He denies any respiratory symptoms.   Past Medical History:  Diagnosis Date  . AAA (abdominal aortic aneurysm) ([place])   . AAA (abdominal aortic aneurysm) without rupture ([place])   . Anemia   . Anxiety   . Atrophic kidney   . Cervical radiculopathy   . Chronic airway obstruction ([place])   . Chronic kidney disease (CKD), stage III (moderate)    followed by Dr. Johnny Bridge  .  Chronic tension headaches   . Coronary artery disease   . Coronary atherosclerosis of autologous vein bypass graft   . DDD (degenerative disc disease), lumbar   . Degenerative disc disease, lumbar    with lumbar radiculopathy  . Elbow fracture, left   . GERD (gastroesophageal reflux disease)    . H/O adenomatous polyp of colon   . H/O hemorrhoids   . H/O urticaria   . Headache   . Heart disease   . Hypercholesteremia   . Hyperlipidemia   . 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 Lucky Cowboy  . Lung cancer ([place])   . Lung cancer ([place])   . Meralgia paresthetica   . Meralgia paresthetica   . Neuralgia   . Osteoarthritis   . Osteoarthritis    s/p L knee surgery  . 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 Presence of permanent cardiac pacemaker   . Prostate cancer ([place])   . Second degree AV block    Followed by Dr. Nehemiah Massed  . Sinoatrial node dysfunction (H[facility])   . Stroke ([place])   . TIA (transient ischemic attack)     Past Surgical History:  Procedure Laterality Date  . CATARACT EXTRACTION    . COLONOSCOPY    . COLONOSCOPY    . COLONOSCOPY WITH PROPOFOL N/A 10/20/2015   Procedure: COLONOSCOPY WITH PROPOFOL;  Surgeon: Manya Silvas, MD;  Location: [facility] ENDOSCOPY;  Service: Endoscopy;  Laterality: N/A;  . CORONARY ARTERY BYPASS GRAFT     triple  . coronary atherosclerosis of autologous vein bypass graft    . EYE SURGERY Bilateral    cataract extraction  . JOINT REPLACEMENT    . KNEE ARTHROSCOPY    . LOBECTOMY  01/31/13   RLL w/squamous cell carcinoma lobectomy  . LUNG REMOVAL, PARTIAL  2014   right lower lobe  . PACEMAKER INSERTION    . PACEMAKER INSERTION  12/2012   Dual chanber pacemaker generator  . POLYPECTOMY    . PROSTATECTOMY    . TOTAL KNEE ARTHROPLASTY Bilateral   . TOTAL SHOULDER ARTHROPLASTY Left 07/10/2015   Procedure: TOTAL SHOULDER ARTHROPLASTY;  Surgeon: [name], MD;  Location: [facility] ORS;  Service: Orthopedics;  Laterality: Left;  . TOTAL SHOULDER REPLACEMENT      Family History  Problem Relation Age of Onset  . Heart attack Mother   . Heart attack Father   . Breast cancer Sister   . Asthma Sister     Social History:  reports that he quit smoking about 12 years ago. He  has a 67.50 pack-year smoking history. He has never used smokeless tobacco. He reports that he does not drink alcohol or use drugs.  He has a handicapped son.  He lives in [place]. The patient is accompanied by his wife, [name], today.  Allergies: No Known Allergies  Current Medications: Current Outpatient Prescriptions  Medication Sig Dispense Refill  . aspirin EC 81 MG tablet Take 81 mg by mouth daily.     Marland Kitchen atorvastatin (LIPITOR) 40 MG tablet Take 40 mg by mouth daily at 6 PM.     . Calcium Carb-Cholecalciferol (CALCIUM 600 + D PO) Take 1 tablet by mouth daily.    . carisoprodol (SOMA) 350 MG tablet Take 350 mg by mouth 4 (four) times daily as needed for muscle spasms.    . cetirizine (ZYRTEC) 10 MG tablet Take 10  mg by mouth daily.    . clopidogrel (PLAVIX) 75 MG tablet Take 75 mg by mouth daily.     . clotrimazole-betamethasone (LOTRISONE) cream Apply 1 application topically 2 (two) times daily.    Marland Kitchen docusate sodium (COLACE) 100 MG capsule Take 100 mg by mouth daily.    Marland Kitchen donepezil (ARICEPT) 5 MG tablet Take 5 mg by mouth at bedtime.    . ferrous sulfate (SLOW FE) 160 (50 FE) MG TBCR SR tablet Take 1 tablet by mouth 2 (two) times daily.     Marland Kitchen lisinopril (PRINIVIL,ZESTRIL) 5 MG tablet Take 5 mg by mouth daily.     . Multiple Vitamins-Minerals (CENTRUM SILVER ULTRA MENS) TABS Take 1 tablet by mouth daily.     . Multiple Vitamins-Minerals (PRESERVISION/LUTEIN) CAPS Take 1 capsule by mouth 2 (two) times daily.     . niacin (NIASPAN) 500 MG CR tablet Take 500 mg by mouth daily.     . Omega-3 Fatty Acids (FISH OIL) 1200 MG CAPS Take 2 capsules by mouth 2 (two) times daily.     Marland Kitchen omeprazole (PRILOSEC) 20 MG capsule Take 20 mg by mouth daily.     . traMADol-acetaminophen (ULTRACET) 37.5-325 MG per tablet 1 tablet every 6 (six) hours as needed for moderate pain or severe pain.     . vitamin B-12 (CYANOCOBALAMIN) 1000 MCG tablet Take 1,000 mcg by mouth daily.    Marland Kitchen zolpidem (AMBIEN) 10 MG tablet Take  10 mg by mouth at bedtime.      No current facility-administered medications for this visit.     Review of Systems:  GENERAL:  Feels good except for back.  No fevers, sweats or weight loss. PERFORMANCE STATUS (ECOG):  1 HEENT:  Sinus issues.  No visual changes, runny nose, sore throat, mouth sores or tenderness. Lungs: No shortness of breath or cough.  No hemoptysis. Cardiac:  No chest pain, palpitations, orthopnea, or PND. GI:  No nausea, vomiting, diarrhea, constipation, melena or hematochezia. GU:  No urgency, frequency, dysuria, or hematuria. Musculoskeletal:  Back issues (see HPI).  No joint pain.  No muscle tenderness. Extremities:  No pain or swelling. Skin:  No rashes or skin changes. Neuro:  No headache, numbness or weakness, balance or coordination issues. Endocrine:  No diabetes, thyroid issues, hot flashes or night sweats. Psych:  No mood changes, depression or anxiety. Pain:  Back pain. Review of systems:  All other systems reviewed and found to be negative.  Physical Exam: Blood pressure 128/81, pulse 62, temperature 98.4 F (36.9 C), temperature source Tympanic, resp. rate 18, height _0  (1.803 m), weight 235 lb 3.7 oz (106.7 kg). GENERAL:  Well developed, well nourished, gentleman sitting comfortably in the exam room in no acute distress. MENTAL STATUS:  Alert and oriented to person, place and time. HEAD:  Short graying hair.  Male pattern baldness.  Normocephalic, atraumatic, face symmetric, no Cushingoid features. EYES:  Glasses.  Hazel eyes.  Pupils equal round and reactive to light and accomodation.  No conjunctivitis or scleral icterus. ENT:  Oropharynx clear without lesion.  Tongue normal.  Dentures.  Mucous membranes moist.  RESPIRATORY:  Clear to auscultation without rales, wheezes or rhonchi. CARDIOVASCULAR:  Regular rate and rhythm without murmur, rub or gallop. ABDOMEN:  Soft, non-tender, with active bowel sounds, and no hepatosplenomegaly.  No  masses. SKIN:  Fungal nails.  No rashes, ulcers or lesions. EXTREMITIES: No edema, no skin discoloration or tenderness.  No palpable cords. LYMPH NODES: No palpable cervical,  supraclavicular, axillary or inguinal adenopathy  NEUROLOGICAL: Unremarkable. PSYCH:  Appropriate.   Orders Only on 10/11/2016  Component Date Value Ref Range Status  . WBC 10/11/2016 6.3  3.8 - 10.6 K/uL Final  . RBC 10/11/2016 4.79  4.40 - 5.90 MIL/uL Final  . Hemoglobin 10/11/2016 14.2  13.0 - 18.0 g/dL Final  . HCT 10/11/2016 42.3  40.0 - 52.0 % Final  . MCV 10/11/2016 88.3  80.0 - 100.0 fL Final  . MCH 10/11/2016 29.6  26.0 - 34.0 pg Final  . MCHC 10/11/2016 33.5  32.0 - 36.0 g/dL Final  . RDW 10/11/2016 14.3  11.5 - 14.5 % Final  . Platelets 10/11/2016 177  150 - 440 K/uL Final  . Neutrophils Relative % 10/11/2016 67  % Final  . Neutro Abs 10/11/2016 4.3  1.4 - 6.5 K/uL Final  . Lymphocytes Relative 10/11/2016 21  % Final  . Lymphs Abs 10/11/2016 1.3  1.0 - 3.6 K/uL Final  . Monocytes Relative 10/11/2016 10  % Final  . Monocytes Absolute 10/11/2016 0.6  0.2 - 1.0 K/uL Final  . Eosinophils Relative 10/11/2016 2  % Final  . Eosinophils Absolute 10/11/2016 0.1  0 - 0.7 K/uL Final  . Basophils Relative 10/11/2016 0  % Final  . Basophils Absolute 10/11/2016 0.0  0 - 0.1 K/uL Final  . Sodium 10/11/2016 139  135 - 145 mmol/L Final  . Potassium 10/11/2016 4.6  3.5 - 5.1 mmol/L Final  . Chloride 10/11/2016 104  101 - 111 mmol/L Final  . CO2 10/11/2016 29  22 - 32 mmol/L Final  . Glucose, Bld 10/11/2016 81  65 - 99 mg/dL Final  . BUN 10/11/2016 22* 6 - 20 mg/dL Final  . Creatinine, Ser 10/11/2016 1.77* 0.61 - 1.24 mg/dL Final  . Calcium 10/11/2016 9.4  8.9 - 10.3 mg/dL Final  . Total Protein 10/11/2016 6.9  6.5 - 8.1 g/dL Final  . Albumin 10/11/2016 4.0  3.5 - 5.0 g/dL Final  . AST 10/11/2016 26  15 - 41 U/L Final  . ALT 10/11/2016 30  17 - 63 U/L Final  . Alkaline Phosphatase 10/11/2016 69  38 - 126 U/L  Final  . Total Bilirubin 10/11/2016 0.7  0.3 - 1.2 mg/dL Final  . GFR calc non Af Amer 10/11/2016 36* >60 mL/min Final  . GFR calc Af Amer 10/11/2016 42* >60 mL/min Final   Comment: (NOTE) The eGFR has been calculated using the CKD EPI equation. This calculation has not been validated in all clinical situations. eGFR's persistently <60 mL/min signify possible Chronic Kidney Disease.   Georgiann Hahn gap 10/11/2016 6  5 - 15 Final    Assessment:  Rick Mcbride. is a 75 y.o. male with stage I right lower lobe lung cancer (2014) and a history of prostate cancer (2011).    Chest CT on 11/16/2012 revealed a 1.4 x 1.9 cm subpleural spiculated nodular density.  PET scan on 01/16/2013 revealed abnormal localization within the right lower lobe concerning for underlying malignancy.  There was no mediastinal or hilar abnormal uptake to suggest metastatic disease  He underwent right lower lobe wedge resection on 01/31/2013.  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 The subcarinal lymph node was negative for malignancy.  Right lower lobe lobectomy revealed a focal residual squamous cell carcinoma (0.1 cm) adjacent to the staple line.  There was no pleural invasion. There was no lymphovascular invasion.  There were 3  lobar lymph nodes negative for malignancy. Pathologic stage was T1bN0.  PET scan on 10/24/2014 revealed right lower lobe surgical changes without evidence of hypermetabolic recurrent or metastatic disease. The lingula nodule been present and similar back to 09/24/2013.  He had a 4.5 x 4.3 cm infrarenal abdominal aortic aneurysm extension into the right common iliac artery unchanged.  CXR on 10/06/2016 revealed no active disease was pleural and parenchymal pulmonary scarring on the right.  Dr. Lucky Cowboy is monitoring the aneurysm.  He has a history of prostate cancer.  He underwent robotic prostatectomy on 10/26/2010 at [facility] in  [place], [place].  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  Tumor was limited to a few small foci involving both lobes.  Surgical margins were negative.  Pathologic stage was T2cNx.  PSA was < 0.01 on 04/23/2016.  He is followed by Dr. Alinda Money, urologist, at [facility] in [place].  Colonoscopy on 10/20/2015 revealed multiple polyps in the transverse colon and proximal ascending colon. There were 2 tubular adenomas and 3 hyperplastic polyps. All polyps were negative for high-grade dysplasia or malignancy.  Next colonoscopy is planned in 2019.   He underwent left shoulder arthroplasty on 07/10/2015 by Dr. Milagros Evener for degenerative joint disease.  Left humeral head resection revealed osteoarthrosis, trilineage hematopoiesis, and no evidence of malignancy.   Symptomatically, he feels good except for his back.  He has seen neurosurgery.  He has issues with short term memory following his CVA.  He denies any respiratory symptoms.  Plan: 1.  Discuss diagnosis, staging and management of lung cancer. Discuss recent CXR.  Discuss follow-up chest CT next year. 2.  Discuss prostate cancer and ongoing follow-up with urology. 3.  Labs today:  CBC with diff, CMP. 3.  Chest CT without contrast on 10/07/2017 5.  RTC after CT scan for MD assessment, labs (CBC with diff, CMP, CEA), and review of scan.   Lequita Asal, MD  10/11/2016, 2:49 PM

## 2016-10-11 ENCOUNTER — Ambulatory Visit: Payer: PPO | Admitting: Oncology

## 2016-10-11 ENCOUNTER — Inpatient Hospital Stay (HOSPITAL_BASED_OUTPATIENT_CLINIC_OR_DEPARTMENT_OTHER): Payer: PPO | Admitting: Hematology and Oncology

## 2016-10-11 ENCOUNTER — Encounter: Payer: Self-pay | Admitting: Hematology and Oncology

## 2016-10-11 ENCOUNTER — Other Ambulatory Visit: Payer: Self-pay

## 2016-10-11 ENCOUNTER — Inpatient Hospital Stay: Payer: PPO | Attending: Hematology and Oncology

## 2016-10-11 VITALS — BP 128/81 | HR 62 | Temp 98.4°F | Resp 18 | Ht 71.0 in | Wt 235.2 lb

## 2016-10-11 DIAGNOSIS — J439 Emphysema, unspecified: Secondary | ICD-10-CM | POA: Insufficient documentation

## 2016-10-11 DIAGNOSIS — Z8546 Personal history of malignant neoplasm of prostate: Secondary | ICD-10-CM

## 2016-10-11 DIAGNOSIS — I714 Abdominal aortic aneurysm, without rupture: Secondary | ICD-10-CM | POA: Diagnosis not present

## 2016-10-11 DIAGNOSIS — E7800 Pure hypercholesterolemia, unspecified: Secondary | ICD-10-CM | POA: Insufficient documentation

## 2016-10-11 DIAGNOSIS — N183 Chronic kidney disease, stage 3 (moderate): Secondary | ICD-10-CM | POA: Insufficient documentation

## 2016-10-11 DIAGNOSIS — I2510 Atherosclerotic heart disease of native coronary artery without angina pectoris: Secondary | ICD-10-CM | POA: Insufficient documentation

## 2016-10-11 DIAGNOSIS — Z87891 Personal history of nicotine dependence: Secondary | ICD-10-CM | POA: Insufficient documentation

## 2016-10-11 DIAGNOSIS — Z79899 Other long term (current) drug therapy: Secondary | ICD-10-CM | POA: Diagnosis not present

## 2016-10-11 DIAGNOSIS — Z950 Presence of cardiac pacemaker: Secondary | ICD-10-CM | POA: Insufficient documentation

## 2016-10-11 DIAGNOSIS — F419 Anxiety disorder, unspecified: Secondary | ICD-10-CM | POA: Diagnosis not present

## 2016-10-11 DIAGNOSIS — Z96653 Presence of artificial knee joint, bilateral: Secondary | ICD-10-CM | POA: Insufficient documentation

## 2016-10-11 DIAGNOSIS — Z85118 Personal history of other malignant neoplasm of bronchus and lung: Secondary | ICD-10-CM

## 2016-10-11 DIAGNOSIS — C3490 Malignant neoplasm of unspecified part of unspecified bronchus or lung: Secondary | ICD-10-CM

## 2016-10-11 DIAGNOSIS — K219 Gastro-esophageal reflux disease without esophagitis: Secondary | ICD-10-CM | POA: Insufficient documentation

## 2016-10-11 DIAGNOSIS — Z8673 Personal history of transient ischemic attack (TIA), and cerebral infarction without residual deficits: Secondary | ICD-10-CM | POA: Insufficient documentation

## 2016-10-11 DIAGNOSIS — E785 Hyperlipidemia, unspecified: Secondary | ICD-10-CM | POA: Diagnosis not present

## 2016-10-11 DIAGNOSIS — Z7982 Long term (current) use of aspirin: Secondary | ICD-10-CM | POA: Insufficient documentation

## 2016-10-11 DIAGNOSIS — C61 Malignant neoplasm of prostate: Secondary | ICD-10-CM

## 2016-10-11 LAB — CBC WITH DIFFERENTIAL/PLATELET
Basophils Absolute: 0 10*3/uL (ref 0–0.1)
Basophils Relative: 0 %
Eosinophils Absolute: 0.1 10*3/uL (ref 0–0.7)
Eosinophils Relative: 2 %
HCT: 42.3 % (ref 40.0–52.0)
Hemoglobin: 14.2 g/dL (ref 13.0–18.0)
Lymphocytes Relative: 21 %
Lymphs Abs: 1.3 10*3/uL (ref 1.0–3.6)
MCH: 29.6 pg (ref 26.0–34.0)
MCHC: 33.5 g/dL (ref 32.0–36.0)
MCV: 88.3 fL (ref 80.0–100.0)
Monocytes Absolute: 0.6 10*3/uL (ref 0.2–1.0)
Monocytes Relative: 10 %
Neutro Abs: 4.3 10*3/uL (ref 1.4–6.5)
Neutrophils Relative %: 67 %
Platelets: 177 10*3/uL (ref 150–440)
RBC: 4.79 MIL/uL (ref 4.40–5.90)
RDW: 14.3 % (ref 11.5–14.5)
WBC: 6.3 10*3/uL (ref 3.8–10.6)

## 2016-10-11 LAB — COMPREHENSIVE METABOLIC PANEL
ALT: 30 U/L (ref 17–63)
AST: 26 U/L (ref 15–41)
Albumin: 4 g/dL (ref 3.5–5.0)
Alkaline Phosphatase: 69 U/L (ref 38–126)
Anion gap: 6 (ref 5–15)
BUN: 22 mg/dL — ABNORMAL HIGH (ref 6–20)
CO2: 29 mmol/L (ref 22–32)
Calcium: 9.4 mg/dL (ref 8.9–10.3)
Chloride: 104 mmol/L (ref 101–111)
Creatinine, Ser: 1.77 mg/dL — ABNORMAL HIGH (ref 0.61–1.24)
GFR calc Af Amer: 42 mL/min — ABNORMAL LOW (ref >60)
GFR calc non Af Amer: 36 mL/min — ABNORMAL LOW (ref >60)
Glucose, Bld: 81 mg/dL (ref 65–99)
Potassium: 4.6 mmol/L (ref 3.5–5.1)
Sodium: 139 mmol/L (ref 135–145)
Total Bilirubin: 0.7 mg/dL (ref 0.3–1.2)
Total Protein: 6.9 g/dL (ref 6.5–8.1)

## 2016-10-11 NOTE — Progress Notes (Signed)
Same breathing as usual. Does feel like he has sinus drainage.. No concerns with lung cancer and surgery.  He has prostate cancer and it is checked with his urologist in [place].

## 2016-10-18 DIAGNOSIS — M5137 Other intervertebral disc degeneration, lumbosacral region: Secondary | ICD-10-CM | POA: Diagnosis not present

## 2016-10-22 DIAGNOSIS — M545 Low back pain: Secondary | ICD-10-CM | POA: Diagnosis not present

## 2016-10-22 DIAGNOSIS — M5416 Radiculopathy, lumbar region: Secondary | ICD-10-CM | POA: Diagnosis not present

## 2016-10-22 DIAGNOSIS — M4317 Spondylolisthesis, lumbosacral region: Secondary | ICD-10-CM | POA: Diagnosis not present

## 2016-10-22 DIAGNOSIS — I10 Essential (primary) hypertension: Secondary | ICD-10-CM | POA: Diagnosis not present

## 2016-11-03 ENCOUNTER — Other Ambulatory Visit: Payer: Self-pay | Admitting: Neurosurgery

## 2016-11-03 DIAGNOSIS — E782 Mixed hyperlipidemia: Secondary | ICD-10-CM | POA: Diagnosis not present

## 2016-11-03 DIAGNOSIS — I10 Essential (primary) hypertension: Secondary | ICD-10-CM | POA: Diagnosis not present

## 2016-11-03 DIAGNOSIS — M4316 Spondylolisthesis, lumbar region: Secondary | ICD-10-CM

## 2016-11-03 DIAGNOSIS — R739 Hyperglycemia, unspecified: Secondary | ICD-10-CM | POA: Diagnosis not present

## 2016-11-03 DIAGNOSIS — Z79899 Other long term (current) drug therapy: Secondary | ICD-10-CM | POA: Diagnosis not present

## 2016-11-10 DIAGNOSIS — N183 Chronic kidney disease, stage 3 (moderate): Secondary | ICD-10-CM | POA: Diagnosis not present

## 2016-11-10 DIAGNOSIS — R739 Hyperglycemia, unspecified: Secondary | ICD-10-CM | POA: Diagnosis not present

## 2016-11-10 DIAGNOSIS — K219 Gastro-esophageal reflux disease without esophagitis: Secondary | ICD-10-CM | POA: Diagnosis not present

## 2016-11-10 DIAGNOSIS — G894 Chronic pain syndrome: Secondary | ICD-10-CM | POA: Diagnosis not present

## 2016-11-10 DIAGNOSIS — Z79899 Other long term (current) drug therapy: Secondary | ICD-10-CM | POA: Diagnosis not present

## 2016-11-10 DIAGNOSIS — Z0000 Encounter for general adult medical examination without abnormal findings: Secondary | ICD-10-CM | POA: Diagnosis not present

## 2016-11-10 DIAGNOSIS — I10 Essential (primary) hypertension: Secondary | ICD-10-CM | POA: Diagnosis not present

## 2016-11-10 DIAGNOSIS — E782 Mixed hyperlipidemia: Secondary | ICD-10-CM | POA: Diagnosis not present

## 2016-11-10 DIAGNOSIS — C3490 Malignant neoplasm of unspecified part of unspecified bronchus or lung: Secondary | ICD-10-CM | POA: Diagnosis not present

## 2016-11-10 DIAGNOSIS — M5137 Other intervertebral disc degeneration, lumbosacral region: Secondary | ICD-10-CM | POA: Diagnosis not present

## 2016-11-10 DIAGNOSIS — I739 Peripheral vascular disease, unspecified: Secondary | ICD-10-CM | POA: Diagnosis not present

## 2016-11-16 ENCOUNTER — Ambulatory Visit
Admission: RE | Admit: 2016-11-16 | Discharge: 2016-11-16 | Disposition: A | Discharge status: Home / Self Care | Payer: PPO | Source: Ambulatory Visit | Attending: Neurosurgery | Admitting: Neurosurgery

## 2016-11-16 DIAGNOSIS — M4316 Spondylolisthesis, lumbar region: Secondary | ICD-10-CM

## 2016-11-16 DIAGNOSIS — M48061 Spinal stenosis, lumbar region without neurogenic claudication: Secondary | ICD-10-CM | POA: Diagnosis not present

## 2016-11-16 MED ORDER — IOPAMIDOL (ISOVUE-M 200) INJECTION 41%
15.0000 mL | Freq: Once | INTRAMUSCULAR | Status: AC
  Administered 2016-11-16: 15 mL via INTRATHECAL

## 2016-11-16 MED ORDER — DIAZEPAM 5 MG PO TABS
5.0000 mg | ORAL_TABLET | Freq: Once | ORAL | Status: AC
  Administered 2016-11-16: 5 mg via ORAL

## 2016-11-16 NOTE — Progress Notes (Signed)
Patient states he has been off Plavix for at least the past five days and Tramadol for at least the past two days.  jkl

## 2016-11-16 NOTE — Discharge Instructions (Signed)
Myelogram Discharge Instructions  1. Go home and rest quietly for the next 24 hours.  It is important to lie flat for the next 24 hours.  Get up only to go to the restroom.  You may lie in the bed or on a couch on your back, your stomach, your left side or your right side.  You may have one pillow under your head.  You may have pillows between your knees while you are on your side or under your knees while you are on your back.  2. DO NOT drive today.  Recline the seat as far back as it will go, while still wearing your seat belt, on the way home.  3. You may get up to go to the bathroom as needed.  You may sit up for 10 minutes to eat.  You may resume your normal diet and medications unless otherwise indicated.  Drink lots of extra fluids today and tomorrow.  4. The incidence of headache, nausea, or vomiting is about 5% (one in 20 patients).  If you develop a headache, lie flat and drink plenty of fluids until the headache goes away.  Caffeinated beverages may be helpful.  If you develop severe nausea and vomiting or a headache that does not go away with flat bed rest, call [phone].  5. You may resume normal activities after your 24 hours of bed rest is over; however, do not exert yourself strongly or do any heavy lifting tomorrow. If when you get up you have a headache when standing, go back to bed and force fluids for another 24 hours.  6. Call your physician for a follow-up appointment.  The results of your myelogram will be sent directly to your physician by the following day.  7. If you have any questions or if complications develop after you arrive home, please call [phone].  Discharge instructions have been explained to the patient.  The patient, or the person responsible for the patient, fully understands these instructions.       MAY RESUME PLAVIX TODAY.   May resume Tramadol on Jan. 10, 2018, after 10:30 am.

## 2016-11-19 ENCOUNTER — Telehealth: Payer: Self-pay

## 2016-11-19 NOTE — Telephone Encounter (Signed)
I spoke with patient after he had a myelogram here 11/16/16.  He states he stayed in bed like he "was supposed to" and denies any headache.  jkl

## 2016-11-22 ENCOUNTER — Other Ambulatory Visit (INDEPENDENT_AMBULATORY_CARE_PROVIDER_SITE_OTHER): Payer: Self-pay | Admitting: Vascular Surgery

## 2016-11-22 DIAGNOSIS — I714 Abdominal aortic aneurysm, without rupture, unspecified: Secondary | ICD-10-CM

## 2016-11-23 ENCOUNTER — Ambulatory Visit (INDEPENDENT_AMBULATORY_CARE_PROVIDER_SITE_OTHER): Payer: PPO | Admitting: Vascular Surgery

## 2016-11-23 ENCOUNTER — Encounter (INDEPENDENT_AMBULATORY_CARE_PROVIDER_SITE_OTHER): Payer: Self-pay | Admitting: Vascular Surgery

## 2016-11-23 ENCOUNTER — Ambulatory Visit (INDEPENDENT_AMBULATORY_CARE_PROVIDER_SITE_OTHER): Payer: PPO

## 2016-11-23 VITALS — BP 129/88 | HR 70 | Resp 16 | Ht 71.0 in | Wt 233.0 lb

## 2016-11-23 DIAGNOSIS — I714 Abdominal aortic aneurysm, without rupture, unspecified: Secondary | ICD-10-CM | POA: Insufficient documentation

## 2016-11-23 DIAGNOSIS — I10 Essential (primary) hypertension: Secondary | ICD-10-CM | POA: Diagnosis not present

## 2016-11-23 DIAGNOSIS — E7800 Pure hypercholesterolemia, unspecified: Secondary | ICD-10-CM | POA: Diagnosis not present

## 2016-11-23 NOTE — Assessment & Plan Note (Signed)
Recommend: The patient has an abdominal aortic aneurysm that is 5.0 cm by duplex scan and based on this study it appears that it is suitable for endovascular treatment. This has also grown significantly by about 7 mm since a duplex 6 months ago.  The patient is otherwise in reasonable health though he has bad back issues and is going to be back surgery in the future as well. It is unclear how much of a rolling aneurysm could be playing in his back pain..   Therefore, the patient should undergo endovascular repair of the AAA to prevent future leathal rupture.   Patient will require CT angiography of the abdomen and pelvis in order to appropriately plan repair of the AAA.  The risks and benefits as well as the alternative therapies was discussed in detail with the patient. All questions were answered. The patient agrees to move forward the AAA repair and therefore with CT scan.  The patient will follow up with me in the office after the CT scan to review the study.

## 2016-11-23 NOTE — Progress Notes (Signed)
MRN : [id number]  Rick Mcbride. is a 76 y.o. (03/26/1941) male who presents with chief complaint of  Chief Complaint  Patient presents with  . Re-evaluation    Follow up ultrasound  .  History of Present Illness: Patient returns today in follow up of His abdominal aortic aneurysm. He has been having more back pain and is scheduled to see a neurosurgeon tomorrow for further evaluation of this. The pain has been chronic and gradually worsening, without an acute exacerbation. He reports no signs of peripheral embolization and no abdominal pain. His aortic duplex today suggests significant increase in the size of abdominal aortic aneurysm now measuring 5.3 cm infrarenal diameter. This was previously 4.6 cm 6 months ago.  Current Outpatient Prescriptions  Medication Sig Dispense Refill  . aspirin EC 81 MG tablet Take 81 mg by mouth daily.     Marland Kitchen atorvastatin (LIPITOR) 40 MG tablet Take 40 mg by mouth daily at 6 PM.     . Calcium Carb-Cholecalciferol (CALCIUM 600 + D PO) Take 1 tablet by mouth daily.    . carisoprodol (SOMA) 350 MG tablet Take 350 mg by mouth 4 (four) times daily as needed for muscle spasms.    . cetirizine (ZYRTEC) 10 MG tablet Take 10 mg by mouth daily.    . clopidogrel (PLAVIX) 75 MG tablet Take 75 mg by mouth daily.     . clotrimazole-betamethasone (LOTRISONE) cream Apply 1 application topically 2 (two) times daily.    . cyclobenzaprine (FLEXERIL) 10 MG tablet Take 10 mg by mouth 3 (three) times daily as needed for muscle spasms.    Marland Kitchen docusate sodium (COLACE) 100 MG capsule Take 100 mg by mouth daily.    Marland Kitchen donepezil (ARICEPT) 5 MG tablet Take 5 mg by mouth at bedtime.    . ferrous sulfate (SLOW FE) 160 (50 FE) MG TBCR SR tablet Take 1 tablet by mouth 2 (two) times daily.     Marland Kitchen lisinopril (PRINIVIL,ZESTRIL) 5 MG tablet Take 5 mg by mouth daily.     . Multiple Vitamins-Minerals (CENTRUM SILVER ULTRA MENS) TABS Take 1 tablet by mouth daily.     . Multiple  Vitamins-Minerals (PRESERVISION/LUTEIN) CAPS Take 1 capsule by mouth 2 (two) times daily.     . niacin (NIASPAN) 500 MG CR tablet Take 500 mg by mouth daily.     . Omega-3 Fatty Acids (FISH OIL) 1200 MG CAPS Take 2 capsules by mouth 2 (two) times daily.     Marland Kitchen omeprazole (PRILOSEC) 20 MG capsule Take 20 mg by mouth daily.     . traMADol-acetaminophen (ULTRACET) 37.5-325 MG per tablet 1 tablet every 6 (six) hours as needed for moderate pain or severe pain.     . vitamin B-12 (CYANOCOBALAMIN) 1000 MCG tablet Take 1,000 mcg by mouth daily.    Marland Kitchen zolpidem (AMBIEN) 10 MG tablet Take 10 mg by mouth at bedtime.      No current facility-administered medications for this visit.     Past Medical History:  Diagnosis Date  . AAA (abdominal aortic aneurysm) ([place])   . AAA (abdominal aortic aneurysm) without rupture ([place])   . Anemia   . Anxiety   . Atrophic kidney   . Cervical radiculopathy   . Chronic airway obstruction ([employer])   . Chronic kidney disease (CKD), stage III (moderate)    followed by Dr. Johnny Bridge  . Chronic tension headaches   . Coronary artery disease   . Coronary atherosclerosis of autologous vein  bypass graft   . DDD (degenerative disc disease), lumbar   . Degenerative disc disease, lumbar    with lumbar radiculopathy  . Elbow fracture, left   . GERD (gastroesophageal reflux disease)   . H/O adenomatous polyp of colon   . H/O hemorrhoids   . H/O urticaria   . Headache   . Heart disease   . Hypercholesteremia   . Hyperlipidemia   . 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 Lucky Cowboy  . Lung cancer ([place])   . Lung cancer ([place])   . Meralgia paresthetica   . Meralgia paresthetica   . Neuralgia   . Osteoarthritis   . Osteoarthritis    s/p L knee surgery  . 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 Presence of permanent cardiac pacemaker   . Prostate cancer ([place])   . Second degree AV block    Followed by Dr.  Nehemiah Massed  . Sinoatrial node dysfunction ([facility])   . Stroke ([place])   . TIA (transient ischemic attack)     Past Surgical History:  Procedure Laterality Date  . CATARACT EXTRACTION    . COLONOSCOPY    . COLONOSCOPY    . COLONOSCOPY WITH PROPOFOL N/A 10/20/2015   Procedure: COLONOSCOPY WITH PROPOFOL;  Surgeon: Manya Silvas, MD;  Location: [facility]OSCOPY;  Service: Endoscopy;  Laterality: N/A;  . CORONARY ARTERY BYPASS GRAFT     triple  . coronary atherosclerosis of autologous vein bypass graft    . EYE SURGERY Bilateral    cataract extraction  . JOINT REPLACEMENT    . KNEE ARTHROSCOPY    . LOBECTOMY  01/31/13   RLL w/squamous cell carcinoma lobectomy  . LUNG REMOVAL, PARTIAL  2014   right lower lobe  . PACEMAKER INSERTION    . PACEMAKER INSERTION  12/2012   Dual chanber pacemaker generator  . POLYPECTOMY    . PROSTATECTOMY    . TOTAL KNEE ARTHROPLASTY Bilateral   . TOTAL SHOULDER ARTHROPLASTY Left 07/10/2015   Procedure: TOTAL SHOULDER ARTHROPLASTY;  Surgeon: [name], MD;  Location: [facility] ORS;  Service: Orthopedics;  Laterality: Left;  . TOTAL SHOULDER REPLACEMENT      Social History Social History  Substance Use Topics  . Smoking status: Former Smoker    Packs/day: 1.50    Years: 45.00    Quit date: 04/07/2004  . Smokeless tobacco: Never Used  . Alcohol use No   Married, wife accompanies him today   Family History Family History  Problem Relation Age of Onset  . Heart attack Mother   . Heart attack Father   . Breast cancer Sister   . Asthma Sister    No history of bleeding disorders or clotting disorders  No Known Allergies   REVIEW OF SYSTEMS (Negative unless checked)  Constitutional: [] Weight loss  [] Fever  [] Chills Cardiac: [] Chest pain   [] Chest pressure   [] Palpitations   [] Shortness of breath when laying flat   [] Shortness of breath at rest   [] Shortness of breath with exertion. Vascular:  [] Pain in legs with walking   [] Pain in legs at rest    [] Pain in legs when laying flat   [] Claudication   [] Pain in feet when walking  [] Pain in feet at rest  [] Pain in feet when laying flat   [] History of DVT   [] Phlebitis   [] Swelling in legs   [] Varicose veins   []   Non-healing ulcers Pulmonary:   [] Uses home oxygen   [] Productive cough   [] Hemoptysis   [] Wheeze  [] COPD   [] Asthma Neurologic:  [] Dizziness  [] Blackouts   [] Seizures   [] History of stroke   [] History of TIA  [] Aphasia   [] Temporary blindness   [] Dysphagia   [] Weakness or numbness in arms   [] Weakness or numbness in legs Musculoskeletal:  [x] Arthritis   [] Joint swelling   [x] Joint pain   [x] Low back pain Hematologic:  [] Easy bruising  [] Easy bleeding   [] Hypercoagulable state   [] Anemic   Gastrointestinal:  [] Blood in stool   [] Vomiting blood  [] Gastroesophageal reflux/heartburn   [] Abdominal pain Genitourinary:  [] Chronic kidney disease   [] Difficult urination  [] Frequent urination  [] Burning with urination   [] Hematuria Skin:  [] Rashes   [] Ulcers   [] Wounds Psychological:  [] History of anxiety   []  History of major depression.  Physical Examination  BP 129/88 (BP Location: Right Arm)   Pulse 70   Resp 16   Ht 5' 11"  (1.803 m)   Wt 233 lb (105.7 kg)   BMI 32.50 kg/m  Gen:  WD/WN, NAD Head: [place]/AT, No temporalis wasting. Ear/Nose/Throat: Hearing grossly intact, nares w/o erythema or drainage, trachea midline Eyes: Conjunctiva clear. Sclera non-icteric Neck: Supple.  No JVD.  Pulmonary:  Good air movement, no use of accessory muscles.  Cardiac: RRR, normal S1, S2 Vascular:  Vessel Right Left  Radial Palpable Palpable                                   Gastrointestinal: soft, non-tender/non-distended. Aortic impulse is enlarged Musculoskeletal: M/S 5/5 throughout.  No deformity or atrophy.  Neurologic: Sensation grossly intact in extremities.  Symmetrical.  Speech is fluent.  Psychiatric: Judgment intact, Mood & affect appropriate for pt's clinical  situation. Dermatologic: No rashes or ulcers noted.  No cellulitis or open wounds. Lymph : No Cervical, Axillary, or Inguinal lymphadenopathy.      Labs Recent Results (from the past 2160 hour(s))  CBC with Differential     Status: None   Collection Time: 10/11/16  1:50 PM  Result Value Ref Range   WBC 6.3 3.8 - 10.6 K/uL   RBC 4.79 4.40 - 5.90 MIL/uL   Hemoglobin 14.2 13.0 - 18.0 g/dL   HCT 42.3 40.0 - 52.0 %   MCV 88.3 80.0 - 100.0 fL   MCH 29.6 26.0 - 34.0 pg   MCHC 33.5 32.0 - 36.0 g/dL   RDW 14.3 11.5 - 14.5 %   Platelets 177 150 - 440 K/uL   Neutrophils Relative % 67 %   Neutro Abs 4.3 1.4 - 6.5 K/uL   Lymphocytes Relative 21 %   Lymphs Abs 1.3 1.0 - 3.6 K/uL   Monocytes Relative 10 %   Monocytes Absolute 0.6 0.2 - 1.0 K/uL   Eosinophils Relative 2 %   Eosinophils Absolute 0.1 0 - 0.7 K/uL   Basophils Relative 0 %   Basophils Absolute 0.0 0 - 0.1 K/uL  Comprehensive metabolic panel     Status: Abnormal   Collection Time: 10/11/16  1:50 PM  Result Value Ref Range   Sodium 139 135 - 145 mmol/L   Potassium 4.6 3.5 - 5.1 mmol/L   Chloride 104 101 - 111 mmol/L   CO2 29 22 - 32 mmol/L   Glucose, Bld 81 65 - 99 mg/dL   BUN 22 (H) 6 - 20 mg/dL  Creatinine, Ser 1.77 (H) 0.61 - 1.24 mg/dL   Calcium 9.4 8.9 - 10.3 mg/dL   Total Protein 6.9 6.5 - 8.1 g/dL   Albumin 4.0 3.5 - 5.0 g/dL   AST 26 15 - 41 U/L   ALT 30 17 - 63 U/L   Alkaline Phosphatase 69 38 - 126 U/L   Total Bilirubin 0.7 0.3 - 1.2 mg/dL   GFR calc non Af Amer 36 (L) >60 mL/min   GFR calc Af Amer 42 (L) >60 mL/min    Comment: (NOTE) The eGFR has been calculated using the CKD EPI equation. This calculation has not been validated in all clinical situations. eGFR's persistently <60 mL/min signify possible Chronic Kidney Disease.    Anion gap 6 5 - 15    Radiology Ct Lumbar Spine W Contrast  Result Date: 11/16/2016 CLINICAL DATA:  Low back pain. BILATERAL leg pain. Symptoms worse with standing and  walking. EXAM: LUMBAR MYELOGRAM FLUOROSCOPY TIME:  1 minutes and 27 seconds corresponding to a Dose Area Product of 525 Gy*m2 PROCEDURE: After thorough discussion of risks and benefits of the procedure including bleeding, infection, injury to nerves, blood vessels, adjacent structures as well as headache and CSF leak, written and oral informed consent was obtained. Consent was obtained by Dr. Rolla Flatten. Time out form was completed. Patient was positioned prone on the fluoroscopy table. Local anesthesia was provided with 1% lidocaine without epinephrine after prepped and draped in the usual sterile fashion. Puncture was performed at L3 using a 3 1/2 inch 22-gauge spinal needle via midline approach. Using a single pass through the dura, the needle was placed within the thecal sac, with return of clear CSF. 15 mL of Isovue-M 200 was injected into the thecal sac, with normal opacification of the nerve roots and cauda equina consistent with free flow within the subarachnoid space. I personally performed the lumbar puncture and administered the intrathecal contrast. I also personally supervised acquisition of the myelogram images. TECHNIQUE: Contiguous axial images were obtained through the Lumbar spine after the intrathecal infusion of infusion. Coronal and sagittal reconstructions were obtained of the axial image sets. COMPARISON:  Lumbar MRI 02/01/2008.  CT lumbar spine 06/04/2016. FINDINGS: LUMBAR MYELOGRAM FINDINGS: Good opacification lumbar subarachnoid space. Minimal subdural contrast injection site, slight mixed injection. With patient prone for myelography, there is 14 mm anterolisthesis L5 on S1. Severe disc space narrowing at this level. No S1 nerve root cut off. BILATERAL L5 nerve root effacement secondary to the grade 2 slip at L5-S1. Asymmetric loss of interspace height at L2-3 with moderate to severe stenosis and LEFT greater than RIGHT L3 nerve root impingement. Standing flexion extension radiographs  were performed. In upright neutral, the anterolisthesis at L5-S1 is 15 mm. In extension, the anterolisthesis at L5-S1 also measures 15 mm. In flexion, the L5-S1 anterolisthesis increases to 18 mm. Retrolisthesis at L1-2 and L2-3 is compensatory. With patient standing, the 4 mm retrolisthesis at L2-3 is stable between extension and flexion. Similarly, 6 mm retrolisthesis L1-2 the is stable between flexion extension. Standing AP view demonstrates mild degenerative scoliosis convex LEFT in the lower lumbar region and convex RIGHT in the upper lumbar region. CT LUMBAR MYELOGRAM FINDINGS: Segmentation: Normal. Alignment: Grade 2 anterolisthesis L5-S1 of 12 mm with patient recumbent. 3 mm retrolisthesis at L2-3 and 4 mm retrolisthesis L1-2 are compensatory. Vertebrae: No worrisome osseous lesion.Subchondral cyst formation and endplate sclerosis are observed multiple levels, probably worst at L4-5 and L5-S1. Conus medullaris: Normal in size. Slightly low termination  upper L2. Paraspinal tissues: Renal cystic disease with chunky calcification on the LEFT. This appearance is not typical of benign cystic disease, and CT abdomen pelvis with contrast recommended for further evaluation. Abdominal aortic aneurysm approximately 5 mm transverse diameter at the L3-4 level. Pacemaker. Disc levels: L1-L2: 4 mm retrolisthesis. Annular bulge. Facet arthropathy. No definite impingement. L2-L3: 3 mm retrolisthesis. Osseous ridging. Annular bulge. Posterior element hypertrophy. LEFT greater than RIGHT subarticular zone narrowing compresses the BILATERAL L3 nerve roots. Endplate spurring laterally could affect the LEFT L2 nerve root. L3-L4: Trace retrolisthesis. Annular bulge. Facet arthropathy. No impingement. L4-L5: Asymmetric loss of interspace height on the RIGHT. Advanced disc space narrowing. Osseous ridging with annular bulge. Facet arthropathy. No subarticular zone narrowing. BILATERAL foraminal narrowing could affect either L4  nerve root. Incidental intradural fatty infiltration of the filum terminale/focal lipoma 2 x 3 mm, non worrisome. L5-S1: BILATERAL L5 pars defects. Facet arthropathy is superimposed. 12 mm anterolisthesis. Severe disc space narrowing throughout. Mild stenosis. No subarticular zone narrowing but severe BILATERAL foraminal narrowing affects both L5 nerve roots. 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 Dynamic instability with patient standing, up to 18 mm anterolisthesis in flexion. Moderate stenosis at L2-3, LEFT greater than RIGHT, affecting the L3 nerve roots. CT LUMBAR MYELOGRAM IMPRESSION: At L5-S1, grade 2 anterolisthesis is accompanied by severe foraminal narrowing. BILATERAL L5 nerve root impingement is observed. No central canal stenosis or definite subarticular zone narrowing. Multifactorial stenosis at L2-3 related to 3 mm retrolisthesis, osseous ridging, and posterior element hypertrophy. LEFT greater than RIGHT subarticular zone narrowing affect the L3 nerve roots. 5 cm abdominal aortic aneurysm. Recommend followup by abdomen and pelvis CTA in 3-6 months, and vascular surgery referral/consultation if not already obtained. This recommendation follows ACR consensus guidelines: White Paper of the ACR Incidental Findings Committee II on Vascular Findings. J Am Coll Radiol 2013; 10:789-794. Complicated LEFT renal cystic disease. CT abdomen pelvis with contrast recommended for further evaluation. These results will be called to the ordering clinician or representative by the Radiologist Assistant, and communication documented in the PACS or zVision Dashboard. Electronically Signed   By: Staci Righter M.D.   On: 11/16/2016 12:28   Dg Myelography Lumbar Inj Lumbosacral  Result Date: 11/16/2016 CLINICAL DATA:  Low back pain. BILATERAL leg pain. Symptoms worse with standing and walking. EXAM: LUMBAR MYELOGRAM FLUOROSCOPY TIME:  1 minutes and 27  seconds corresponding to a Dose Area Product of 525 Gy*m2 PROCEDURE: After thorough discussion of risks and benefits of the procedure including bleeding, infection, injury to nerves, blood vessels, adjacent structures as well as headache and CSF leak, written and oral informed consent was obtained. Consent was obtained by Dr. Rolla Flatten. Time out form was completed. Patient was positioned prone on the fluoroscopy table. Local anesthesia was provided with 1% lidocaine without epinephrine after prepped and draped in the usual sterile fashion. Puncture was performed at L3 using a 3 1/2 inch 22-gauge spinal needle via midline approach. Using a single pass through the dura, the needle was placed within the thecal sac, with return of clear CSF. 15 mL of Isovue-M 200 was injected into the thecal sac, with normal opacification of the nerve roots and cauda equina consistent with free flow within the subarachnoid space. I personally performed the lumbar puncture and administered the intrathecal contrast. I also personally supervised acquisition of the myelogram images. TECHNIQUE: Contiguous axial images were obtained through the Lumbar spine after the intrathecal infusion of infusion. Coronal  and sagittal reconstructions were obtained of the axial image sets. COMPARISON:  Lumbar MRI 02/01/2008.  CT lumbar spine 06/04/2016. FINDINGS: LUMBAR MYELOGRAM FINDINGS: Good opacification lumbar subarachnoid space. Minimal subdural contrast injection site, slight mixed injection. With patient prone for myelography, there is 14 mm anterolisthesis L5 on S1. Severe disc space narrowing at this level. No S1 nerve root cut off. BILATERAL L5 nerve root effacement secondary to the grade 2 slip at L5-S1. Asymmetric loss of interspace height at L2-3 with moderate to severe stenosis and LEFT greater than RIGHT L3 nerve root impingement. Standing flexion extension radiographs were performed. In upright neutral, the anterolisthesis at L5-S1 is 15  mm. In extension, the anterolisthesis at L5-S1 also measures 15 mm. In flexion, the L5-S1 anterolisthesis increases to 18 mm. Retrolisthesis at L1-2 and L2-3 is compensatory. With patient standing, the 4 mm retrolisthesis at L2-3 is stable between extension and flexion. Similarly, 6 mm retrolisthesis L1-2 the is stable between flexion extension. Standing AP view demonstrates mild degenerative scoliosis convex LEFT in the lower lumbar region and convex RIGHT in the upper lumbar region. CT LUMBAR MYELOGRAM FINDINGS: Segmentation: Normal. Alignment: Grade 2 anterolisthesis L5-S1 of 12 mm with patient recumbent. 3 mm retrolisthesis at L2-3 and 4 mm retrolisthesis L1-2 are compensatory. Vertebrae: No worrisome osseous lesion.Subchondral cyst formation and endplate sclerosis are observed multiple levels, probably worst at L4-5 and L5-S1. Conus medullaris: Normal in size. Slightly low termination upper L2. Paraspinal tissues: Renal cystic disease with chunky calcification on the LEFT. This appearance is not typical of benign cystic disease, and CT abdomen pelvis with contrast recommended for further evaluation. Abdominal aortic aneurysm approximately 5 mm transverse diameter at the L3-4 level. Pacemaker. Disc levels: L1-L2: 4 mm retrolisthesis. Annular bulge. Facet arthropathy. No definite impingement. L2-L3: 3 mm retrolisthesis. Osseous ridging. Annular bulge. Posterior element hypertrophy. LEFT greater than RIGHT subarticular zone narrowing compresses the BILATERAL L3 nerve roots. Endplate spurring laterally could affect the LEFT L2 nerve root. L3-L4: Trace retrolisthesis. Annular bulge. Facet arthropathy. No impingement. L4-L5: Asymmetric loss of interspace height on the RIGHT. Advanced disc space narrowing. Osseous ridging with annular bulge. Facet arthropathy. No subarticular zone narrowing. BILATERAL foraminal narrowing could affect either L4 nerve root. Incidental intradural fatty infiltration of the filum  terminale/focal lipoma 2 x 3 mm, non worrisome. L5-S1: BILATERAL L5 pars defects. Facet arthropathy is superimposed. 12 mm anterolisthesis. Severe disc space narrowing throughout. Mild stenosis. No subarticular zone narrowing but severe BILATERAL foraminal narrowing affects both L5 nerve roots. 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 Dynamic instability with patient standing, up to 18 mm anterolisthesis in flexion. Moderate stenosis at L2-3, LEFT greater than RIGHT, affecting the L3 nerve roots. CT LUMBAR MYELOGRAM IMPRESSION: At L5-S1, grade 2 anterolisthesis is accompanied by severe foraminal narrowing. BILATERAL L5 nerve root impingement is observed. No central canal stenosis or definite subarticular zone narrowing. Multifactorial stenosis at L2-3 related to 3 mm retrolisthesis, osseous ridging, and posterior element hypertrophy. LEFT greater than RIGHT subarticular zone narrowing affect the L3 nerve roots. 5 cm abdominal aortic aneurysm. Recommend followup by abdomen and pelvis CTA in 3-6 months, and vascular surgery referral/consultation if not already obtained. This recommendation follows ACR consensus guidelines: White Paper of the ACR Incidental Findings Committee II on Vascular Findings. J Am Coll Radiol 2013; 10:789-794. Complicated LEFT renal cystic disease. CT abdomen pelvis with contrast recommended for further evaluation. These results will be called to the ordering clinician or representative by the Radiologist Assistant,  and communication documented in the PACS or zVision Dashboard. Electronically Signed   By: Staci Righter M.D.   On: 11/16/2016 12:28    Assessment/Plan  Pure hypercholesterolemia lipid control important in reducing the progression of atherosclerotic disease. Continue statin therapy   Benign essential HTN blood pressure control important in reducing the progression of atherosclerotic disease as well as  aneurysmal degeneration. On appropriate oral medications.   AAA (abdominal aortic aneurysm) without rupture ([facility]) Recommend: The patient has an abdominal aortic aneurysm that is 5.0 cm by duplex scan and based on this study it appears that it is suitable for endovascular treatment. This has also grown significantly by about 7 mm since a duplex 6 months ago.  The patient is otherwise in reasonable health though he has bad back issues and is going to be back surgery in the future as well. It is unclear how much of a rolling aneurysm could be playing in his back pain..   Therefore, the patient should undergo endovascular repair of the AAA to prevent future leathal rupture.   Patient will require CT angiography of the abdomen and pelvis in order to appropriately plan repair of the AAA.  The risks and benefits as well as the alternative therapies was discussed in detail with the patient. All questions were answered. The patient agrees to move forward the AAA repair and therefore with CT scan.  The patient will follow up with me in the office after the CT scan to review the study.    [name], MD  11/23/2016 9:44 AM    This note was created with Dragon medical transcription system.  Any errors from dictation are purely unintentional

## 2016-11-23 NOTE — Patient Instructions (Signed)
Abdominal Aortic Aneurysm Blood pumps away from the heart through tubes (blood vessels) called arteries. Aneurysms are weak or damaged places in the wall of an artery. It bulges out like a balloon. An abdominal aortic aneurysm happens in the main artery of the body (aorta). It can burst or tear, causing bleeding inside the body. This is an emergency. It needs treatment right away. What are the causes? The exact cause is unknown. Things that could cause this problem include:  Fat and other substances building up in the lining of a tube.  Swelling of the walls of a blood vessel.  Certain tissue diseases.  Belly (abdominal) trauma.  An infection in the main artery of the body.  What increases the risk? There are things that make it more likely for you to have an aneurysm. These include:  Being over the age of 76 years old.  Having high blood pressure (hypertension).  Being a male.  Being white.  Being very overweight (obese).  Having a family history of aneurysm.  Using tobacco products.  What are the signs or symptoms? Symptoms depend on the size of the aneurysm and how fast it grows. There may not be symptoms. If symptoms occur, they can include:  Pain (belly, side, lower back, or groin).  Feeling full after eating a small amount of food.  Feeling sick to your stomach (nauseous), throwing up (vomiting), or both.  Feeling a lump in your belly that feels like it is beating (pulsating).  Feeling like you will pass out (faint).  How is this treated?  Medicine to control blood pressure and pain.  Imaging tests to see if the aneurysm gets bigger.  Surgery. How is this prevented? To lessen your chance of getting this condition:  Stop smoking. Stop chewing tobacco.  Limit or avoid alcohol.  Keep your blood pressure, blood sugar, and cholesterol within normal limits.  Eat less salt.  Eat foods low in saturated fats and cholesterol. These are found in animal and  whole dairy products.  Eat more fiber. Fiber is found in whole grains, vegetables, and fruits.  Keep a healthy weight.  Stay active and exercise often.  This information is not intended to replace advice given to you by your health care provider. Make sure you discuss any questions you have with your health care provider. Document Released: 02/19/2013 Document Revised: 04/01/2016 Document Reviewed: 11/24/2012 Elsevier Interactive Patient Education  2017 [employer]  

## 2016-11-23 NOTE — Assessment & Plan Note (Addendum)
blood pressure control important in reducing the progression of atherosclerotic disease as well as aneurysmal degeneration. On appropriate oral medications.

## 2016-11-23 NOTE — Assessment & Plan Note (Signed)
lipid control important in reducing the progression of atherosclerotic disease. Continue statin therapy  

## 2016-11-29 DIAGNOSIS — M5417 Radiculopathy, lumbosacral region: Secondary | ICD-10-CM | POA: Diagnosis not present

## 2016-11-29 DIAGNOSIS — Z6832 Body mass index (BMI) 32.0-32.9, adult: Secondary | ICD-10-CM | POA: Diagnosis not present

## 2016-11-29 DIAGNOSIS — M4317 Spondylolisthesis, lumbosacral region: Secondary | ICD-10-CM | POA: Diagnosis not present

## 2016-11-29 DIAGNOSIS — R030 Elevated blood-pressure reading, without diagnosis of hypertension: Secondary | ICD-10-CM | POA: Diagnosis not present

## 2016-12-02 ENCOUNTER — Ambulatory Visit: Admission: RE | Admit: 2016-12-02 | Payer: PPO | Source: Ambulatory Visit

## 2016-12-09 ENCOUNTER — Ambulatory Visit
Admission: RE | Admit: 2016-12-09 | Discharge: 2016-12-09 | Disposition: A | Discharge status: Home / Self Care | Payer: PPO | Source: Ambulatory Visit | Attending: Vascular Surgery | Admitting: Vascular Surgery

## 2016-12-09 DIAGNOSIS — I714 Abdominal aortic aneurysm, without rupture, unspecified: Secondary | ICD-10-CM

## 2016-12-09 DIAGNOSIS — N200 Calculus of kidney: Secondary | ICD-10-CM | POA: Diagnosis not present

## 2016-12-09 LAB — POCT I-STAT CREATININE: CREATININE: 1.9 mg/dL — AB (ref 0.61–1.24)

## 2016-12-09 MED ORDER — IOPAMIDOL (ISOVUE-370) INJECTION 76%
80.0000 mL | Freq: Once | INTRAVENOUS | Status: AC | PRN
  Administered 2016-12-09: 80 mL via INTRAVENOUS

## 2016-12-21 ENCOUNTER — Ambulatory Visit (INDEPENDENT_AMBULATORY_CARE_PROVIDER_SITE_OTHER): Payer: PPO | Admitting: Vascular Surgery

## 2016-12-21 ENCOUNTER — Encounter (INDEPENDENT_AMBULATORY_CARE_PROVIDER_SITE_OTHER): Payer: Self-pay | Admitting: Vascular Surgery

## 2016-12-21 VITALS — BP 137/88 | HR 74 | Resp 16 | Ht 70.0 in | Wt 234.0 lb

## 2016-12-21 DIAGNOSIS — I714 Abdominal aortic aneurysm, without rupture, unspecified: Secondary | ICD-10-CM

## 2016-12-21 DIAGNOSIS — E7800 Pure hypercholesterolemia, unspecified: Secondary | ICD-10-CM

## 2016-12-21 DIAGNOSIS — I10 Essential (primary) hypertension: Secondary | ICD-10-CM

## 2016-12-21 NOTE — Progress Notes (Signed)
MRN : [id number]  Rick Mcbride. is a 76 y.o. (1941-02-19) male who presents with chief complaint of  Chief Complaint  Patient presents with  . Re-evaluation    CT results  .  History of Present Illness: Patient returns today in follow up of His abdominal aortic aneurysm. He continues to have severe low back pain and is awaiting having his aneurysm repaired before he can address his back surgery. He has had a CT scan which I have independently reviewed since his last visit. This demonstrates an approximately 5.2 cm infrarenal abdominal aortic aneurysm. 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 This traverses down into the right hypogastric artery. The left common iliac artery is mildly enlarged but not as large as the right. There is no evidence of extravasation or rupture.         Current Outpatient Prescriptions  Medication Sig Dispense Refill  . aspirin EC 81 MG tablet Take 81 mg by mouth daily.     Marland Kitchen atorvastatin (LIPITOR) 40 MG tablet Take 40 mg by mouth daily at 6 PM.     . Calcium Carb-Cholecalciferol (CALCIUM 600 + D PO) Take 1 tablet by mouth daily.    . carisoprodol (SOMA) 350 MG tablet Take 350 mg by mouth 4 (four) times daily as needed for muscle spasms.    . cetirizine (ZYRTEC) 10 MG tablet Take 10 mg by mouth daily.    . clopidogrel (PLAVIX) 75 MG tablet Take 75 mg by mouth daily.     . clotrimazole-betamethasone (LOTRISONE) cream Apply 1 application topically 2 (two) times daily.    . cyclobenzaprine (FLEXERIL) 10 MG tablet Take 10 mg by mouth 3 (three) times daily as needed for muscle spasms.    Marland Kitchen docusate sodium (COLACE) 100 MG capsule Take 100 mg by mouth daily.    Marland Kitchen donepezil (ARICEPT) 5 MG tablet Take 5 mg by mouth at bedtime.    . ferrous sulfate (SLOW FE) 160 (50 FE) MG TBCR SR tablet Take 1 tablet by mouth 2 (two) times daily.     Marland Kitchen lisinopril  (PRINIVIL,ZESTRIL) 5 MG tablet Take 5 mg by mouth daily.     . Multiple Vitamins-Minerals (CENTRUM SILVER ULTRA MENS) TABS Take 1 tablet by mouth daily.     . Multiple Vitamins-Minerals (PRESERVISION/LUTEIN) CAPS Take 1 capsule by mouth 2 (two) times daily.     . niacin (NIASPAN) 500 MG CR tablet Take 500 mg by mouth daily.     . Omega-3 Fatty Acids (FISH OIL) 1200 MG CAPS Take 2 capsules by mouth 2 (two) times daily.     Marland Kitchen omeprazole (PRILOSEC) 20 MG capsule Take 20 mg by mouth daily.     . traMADol-acetaminophen (ULTRACET) 37.5-325 MG per tablet 1 tablet every 6 (six) hours as needed for moderate pain or severe pain.     . vitamin B-12 (CYANOCOBALAMIN) 1000 MCG tablet Take 1,000 mcg by mouth daily.    Marland Kitchen zolpidem (AMBIEN) 10 MG tablet Take 10 mg by mouth at bedtime.      No current facility-administered medications for this visit.         Past Medical History:  Diagnosis Date  . AAA (abdominal aortic aneurysm) ([place])   . AAA (abdominal aortic aneurysm) without rupture ([place])   . Anemia   . Anxiety   . Atrophic kidney   . Cervical radiculopathy   . 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 Chronic kidney disease (CKD), stage III (moderate)    followed by Dr. Johnny Bridge  . Chronic tension headaches   . Coronary artery disease   . Coronary atherosclerosis of autologous vein bypass graft   . DDD (degenerative disc disease), lumbar   . Degenerative disc disease, lumbar    with lumbar radiculopathy  . Elbow fracture, left   . GERD (gastroesophageal reflux disease)   . H/O adenomatous polyp of colon   . H/O hemorrhoids   . H/O urticaria   . Headache   . Heart disease   . Hypercholesteremia   . Hyperlipidemia   . 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 Lucky Cowboy  . Lung cancer ([place])   . Lung cancer ([place])   . Meralgia paresthetica   . Meralgia paresthetica   . Neuralgia   . Osteoarthritis    . Osteoarthritis    s/p L knee surgery  . 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 Presence of permanent cardiac pacemaker   . Prostate cancer ([place])   . Second degree AV block    Followed by Dr. Nehemiah Massed  . Sinoatrial node dysfunction ([facility])   . Stroke ([place])   . TIA (transient ischemic attack)          Past Surgical History:  Procedure Laterality Date  . CATARACT EXTRACTION    . COLONOSCOPY    . COLONOSCOPY    . COLONOSCOPY WITH PROPOFOL N/A 10/20/2015   Procedure: COLONOSCOPY WITH PROPOFOL;  Surgeon: Manya Silvas, MD;  Location: [facility] ENDOSCOPY;  Service: Endoscopy;  Laterality: N/A;  . CORONARY ARTERY BYPASS GRAFT     triple  . coronary atherosclerosis of autologous vein bypass graft    . EYE SURGERY Bilateral    cataract extraction  . JOINT REPLACEMENT    . KNEE ARTHROSCOPY    . LOBECTOMY  01/31/13   RLL w/squamous cell carcinoma lobectomy  . LUNG REMOVAL, PARTIAL  2014   right lower lobe  . PACEMAKER INSERTION    . PACEMAKER INSERTION  12/2012   Dual chanber pacemaker generator  . POLYPECTOMY    . PROSTATECTOMY    . TOTAL KNEE ARTHROPLASTY Bilateral   . TOTAL SHOULDER ARTHROPLASTY Left 07/10/2015   Procedure: TOTAL SHOULDER ARTHROPLASTY;  Surgeon: [name], MD;  Location: [facility] ORS;  Service: Orthopedics;  Laterality: Left;  . TOTAL SHOULDER REPLACEMENT      Social History      Social History  Substance Use Topics  . Smoking status: Former Smoker    Packs/day: 1.50    Years: 45.00    Quit date: 04/07/2004  . Smokeless tobacco: Never Used  . Alcohol use No   Married, wife accompanies him today   Family History      Family History  Problem Relation Age of Onset  . Heart attack Mother   . Heart attack Father   . Breast cancer Sister   . Asthma Sister    No history of bleeding disorders or clotting disorders  No Known Allergies   REVIEW OF SYSTEMS (Negative  unless checked)  Constitutional: [] Weight loss  [] Fever  [] Chills Cardiac: [] Chest pain   [] Chest pressure   [] Palpitations   [] Shortness of breath when laying flat   [] Shortness of breath at rest   [] Shortness of breath with exertion. Vascular:  [] Pain in legs with walking   []   Pain in legs at rest   [] Pain in legs when laying flat   [] Claudication   [] Pain in feet when walking  [] Pain in feet at rest  [] Pain in feet when laying flat   [] History of DVT   [] Phlebitis   [] Swelling in legs   [] Varicose veins   [] Non-healing ulcers Pulmonary:   [] Uses home oxygen   [] Productive cough   [] Hemoptysis   [] Wheeze  [] COPD   [] Asthma Neurologic:  [] Dizziness  [] Blackouts   [] Seizures   [] History of stroke   [] History of TIA  [] Aphasia   [] Temporary blindness   [] Dysphagia   [] Weakness or numbness in arms   [] Weakness or numbness in legs Musculoskeletal:  [x] Arthritis   [] Joint swelling   [x] Joint pain   [x] Low back pain Hematologic:  [] Easy bruising  [] Easy bleeding   [] Hypercoagulable state   [] Anemic   Gastrointestinal:  [] Blood in stool   [] Vomiting blood  [] Gastroesophageal reflux/heartburn   [] Abdominal pain Genitourinary:  [] Chronic kidney disease   [] Difficult urination  [] Frequent urination  [] Burning with urination   [] Hematuria Skin:  [] Rashes   [] Ulcers   [] Wounds Psychological:  [] History of anxiety   []  History of major depression.  Physical Examination  BP 129/88 (BP Location: Right Arm)   Pulse 70   Resp 16   Ht 5' 11"  (1.803 m)   Wt 233 lb (105.7 kg)   BMI 32.50 kg/m  Gen:  WD/WN, NAD Head: Export/AT, No temporalis wasting. Ear/Nose/Throat: Hearing grossly intact, nares w/o erythema or drainage, trachea midline Eyes: Conjunctiva clear. Sclera non-icteric Neck: Supple.  No JVD.  Pulmonary:  Good air movement, no use of accessory muscles.  Cardiac: RRR, normal S1, S2 Vascular:  Vessel Right Left  Radial Palpable Palpable                                    Gastrointestinal: soft, non-tender/non-distended. Aortic impulse is enlarged Musculoskeletal: M/S 5/5 throughout.  No deformity or atrophy.  Neurologic: Sensation grossly intact in extremities.  Symmetrical.  Speech is fluent.  Psychiatric: Judgment intact, Mood & affect appropriate for pt's clinical situation. Dermatologic: No rashes or ulcers noted.  No cellulitis or open wounds. Lymph : No Cervical, Axillary, or Inguinal lymphadenopathy.       Labs Recent Results (from the past 2160 hour(s))  CBC with Differential     Status: None   Collection Time: 10/11/16  1:50 PM  Result Value Ref Range   WBC 6.3 3.8 - 10.6 K/uL   RBC 4.79 4.40 - 5.90 MIL/uL   Hemoglobin 14.2 13.0 - 18.0 g/dL   HCT 42.3 40.0 - 52.0 %   MCV 88.3 80.0 - 100.0 fL   MCH 29.6 26.0 - 34.0 pg   MCHC 33.5 32.0 - 36.0 g/dL   RDW 14.3 11.5 - 14.5 %   Platelets 177 150 - 440 K/uL   Neutrophils Relative % 67 %   Neutro Abs 4.3 1.4 - 6.5 K/uL   Lymphocytes Relative 21 %   Lymphs Abs 1.3 1.0 - 3.6 K/uL   Monocytes Relative 10 %   Monocytes Absolute 0.6 0.2 - 1.0 K/uL   Eosinophils Relative 2 %   Eosinophils Absolute 0.1 0 - 0.7 K/uL   Basophils Relative 0 %   Basophils Absolute 0.0 0 - 0.1 K/uL  Comprehensive metabolic panel     Status: Abnormal   Collection Time: 10/11/16  1:50 PM  Result Value Ref Range   Sodium 139 135 - 145 mmol/L   Potassium 4.6 3.5 - 5.1 mmol/L   Chloride 104 101 - 111 mmol/L   CO2 29 22 - 32 mmol/L   Glucose, Bld 81 65 - 99 mg/dL   BUN 22 (H) 6 - 20 mg/dL   Creatinine, Ser 1.77 (H) 0.61 - 1.24 mg/dL   Calcium 9.4 8.9 - 10.3 mg/dL   Total Protein 6.9 6.5 - 8.1 g/dL   Albumin 4.0 3.5 - 5.0 g/dL   AST 26 15 - 41 U/L   ALT 30 17 - 63 U/L   Alkaline Phosphatase 69 38 - 126 U/L   Total Bilirubin 0.7 0.3 - 1.2 mg/dL   GFR calc non Af Amer 36 (L) >60 mL/min   GFR calc Af Amer 42 (L) >60 mL/min    Comment: (NOTE) The eGFR has been calculated using the CKD EPI equation. This  calculation has not been validated in all clinical situations. eGFR's persistently <60 mL/min signify possible Chronic Kidney Disease.    Anion gap 6 5 - 15  I-STAT creatinine     Status: Abnormal   Collection Time: 12/09/16  1:59 PM  Result Value Ref Range   Creatinine, Ser 1.90 (H) 0.61 - 1.24 mg/dL    Radiology Ct Angio Abdomen Pelvis  W &/or Wo Contrast  Result Date: 12/10/2016 CLINICAL DATA:  Abdominal aortic aneurysm follow-up EXAM: CTA ABDOMEN AND PELVIS wITHOUT AND WITH CONTRAST TECHNIQUE: Multidetector CT imaging of the abdomen and pelvis was performed using the standard protocol during bolus administration of intravenous contrast. Multiplanar reconstructed images and MIPs were obtained and reviewed to evaluate the vascular anatomy. CONTRAST:  80 cc Isovue 370 COMPARISON:  11/27/2012 FINDINGS: VASCULAR Aorta: An abdominal aortic aneurysm is re- demonstrated. Maximal AP and transverse diameters are 5.2 and 5.1 cm. Previously, maximal diameter was 4.0 cm. There is chronic mural thrombus within the aneurysmal segment. The infrarenal neck is normal in caliber. The aneurysm extends into the right common iliac artery. Celiac: Patent.  Accessory left gastric artery anatomy. SMA: Patent. Renals: Single left renal artery is patent and in the expected takeoff position. There are 2 right renal arteries which are both patent. The accessory right renal artery is at the level of the mid right kidney. IMA: Patent. The origin of the IMA occurs through the chronic mural thrombus. It is diminutive. Inflow: Right common iliac artery is aneurysmal as a continuation of the aortic aneurysm. Maximal diameter is 4.0 cm. This occurs in the proximal right common iliac artery. The distal right common iliac artery, at the origin of the internal iliac artery is also aneurysmal with a maximal diameter of 4.0 cm. The right internal and external iliac artery lumens are patent. The right external iliac artery is normal in  caliber but somewhat tortuous. Left common iliac artery is only slightly ectatic with a maximal diameter of 15 mm. The lumen is patent. Left external iliac artery is patent. Left internal iliac artery is ectatic and patent with a maximal diameter of 1.2 cm. Proximal Outflow: The visualized femoral arteries are grossly patent. There is a small amount of smooth plaque in the proximal left superficial femoral artery. Veins: Portal, hepatic, splenic, renal veins are patent. Iliac venous system is non-opacified. Review of the MIP images confirms the above findings. NON-VASCULAR Lower chest: Scattered subsegmental atelectasis. Hepatobiliary: Mild diffuse hepatic steatosis.  Normal gallbladder. Pancreas: Unremarkable. Spleen: There are 2 nonspecific hypodensities in the spleen not previously visualized.  Previous imaging was noncontrast which may obscure the lesions. Adrenals/Urinary Tract: There are simple cysts in the kidneys. There is a 10 mm calculus in the upper pole of the left kidney. Vascular calcification in the central right kidney is noted. Atrophy of the kidneys. Stomach/Bowel: Stomach and duodenum are unremarkable. No evidence of small-bowel obstruction. No obvious mass in the colon. The appendix is normal. Lymphatic: No evidence of abnormal retroperitoneal adenopathy. Reproductive: Bladder is decompressed. Prostate is apparently absent. Other: No free-fluid. Musculoskeletal: No vertebral compression deformity. L5 pars defects with grade 1 L5-S1 spondylolisthesis. Superimposed degenerative changes are present. IMPRESSION: VASCULAR Abdominal aortic aneurysm is increased from a maximal diameter of 4 cm to 5.2 cm. There is aneurysmal dilatation of the right common iliac artery which measures up to 4.0 cm. The left common iliac artery is normal in caliber. NON-VASCULAR There are 2 new hypodensities in the spleen which are nonspecific. The patient has a history of malignancy or is at risk for malignancy, consider  PET-CT. Left nephrolithiasis. Electronically Signed   By: Marybelle Killings M.D.   On: 12/10/2016 11:07      Assessment/Plan   Pure hypercholesterolemia lipid control important in reducing the progression of atherosclerotic disease. Continue statin therapy   Benign essential HTN blood pressure control important in reducing the progression of atherosclerotic disease as well as aneurysmal degeneration. On appropriate oral medications.  AAA (abdominal aortic aneurysm) without rupture [facility]) He has had a CT scan which I have independently reviewed since his last visit. This demonstrates an approximately 5.2 cm infrarenal abdominal aortic aneurysm. 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 This traverses down into the right hypogastric artery. The left common iliac artery is mildly enlarged but not as large as the right. There is no evidence of extravasation or rupture. We had a long discussion today about the options for treatment. His aneurysm will be amenable to endovascular repair, but will require a preliminary coil embolization of the right hypogastric artery secondary to the aneurysm extending throughout the right common iliac artery and down into the hypogastric artery which would not allow successful sealing of the stent graft and normal tissue without embolization of the right hypogastric artery. His left hypogastric artery appears reasonably normal, so I do not think that there will be a major downfall to coil embolization of the right hypogastric artery. We would plan to perform this next week, and then either the week after or 2 weeks after the coil embolization he would have his endovascular aneurysm repair. I have discussed both procedures in detail including the risks and benefits. I discussed the differences between endovascular therapy and open therapy. He and his wife were strongly desirous of an  endovascular repair. All questions were answered and he agrees with our plan of care.    [name], MD  12/21/2016 11:02 AM    This note was created with Dragon medical transcription system.  Any errors from dictation are purely unintentional

## 2016-12-21 NOTE — Patient Instructions (Signed)
Abdominal Aortic Aneurysm Endograft Repair Abdominal aortic aneurysm endograft repair is a surgery to fix an aortic aneurysm in the abdominal area. An aneurysm is a weak or damaged part of an artery wall that bulges out from the normal force of blood pumping through the body. An abdominal aortic aneurysm is an aneurysm that happens in the lower part of the aorta, which is the main artery of the body. The repair is often done if the aneurysm gets so large that it might burst (rupture). A ruptured aneurysm would cause bleeding inside the body that could put a person's life in danger. Before that happens, this procedure is needed to fix the problem. The procedure may also be done if the aneurysm causes symptoms such as pain in the back, abdomen, or side. In this procedure, a tube made of fabric and metal mesh (endograft or stent-graft) is placed in the weak part of the aorta to repair it. Tell a health care provider about:  Any allergies you have.  All medicines you are taking, including vitamins, herbs, eye drops, creams, and over-the-counter medicines.  Any problems you or family members have had with anesthetic medicines.  Any blood disorders you have.  Any surgeries you have had.  Any medical conditions you have.  Whether you are pregnant or may be pregnant. What are the risks? Generally, this is a safe procedure. However, problems may occur, including:  Infection of the graft or incision area.  Bleeding during the procedure or from the incision site.  Allergic reactions to medicines.  Damage to other structures or organs.  Blood leaking out around the endograft.  The endograft moving from where it was placed during surgery.  Blood flow through the graft becoming blocked.  Blood clots.  Kidney problems.  Blood flow to the legs becoming blocked (rare).  Rupture of the aorta even after the endograft repair is a success (rare). What happens before the procedure? Staying  hydrated  Follow instructions from your health care provider about hydration, which may include:  Up to 2 hours before the procedure - you may continue to drink clear liquids, such as water, clear fruit juice, black coffee, and plain tea. Eating and drinking restrictions  Follow instructions from your health care provider about eating and drinking, which may include:  8 hours before the procedure - stop eating heavy meals or foods such as meat, fried foods, or fatty foods.  6 hours before the procedure - stop eating light meals or foods, such as toast or cereal.  6 hours before the procedure - stop drinking milk or drinks that contain milk.  2 hours before the procedure - stop drinking clear liquids. Medicines  Ask your health care provider about:  Changing or stopping your regular medicines. This is especially important if you are taking diabetes medicines or blood thinners.  Taking medicines such as aspirin and ibuprofen. These medicines can thin your blood. Do not take these medicines before your procedure if your health care provider instructs you not to.  You may be given antibiotic medicine to help prevent infection. General instructions  You may need to have blood tests, a test to check heart rhythm (electrocardiogram, or ECG), or a test to check blood flow (angiogram) before the surgery.  Imaging tests will be done to check the size and location of the aneurysm. These tests could include an ultrasound, a CT scan, or an MRI.  Do not use any products that contain nicotine or tobacco-such as cigarettes and e-cigarettes-for  as long as possible before the surgery. If you need help quitting, ask your health care provider.  Ask your health care provider how your surgical site will be marked or identified.  Plan to have someone take you home from the hospital or clinic. What happens during the procedure?  To reduce your risk of infection:  Your health care team will wash or  sanitize their hands.  Your skin will be washed with soap.  Hair may be removed from the surgical area.  An IV tube will be inserted into one of your veins.  You will be given one or more of the following:  A medicine to help you relax (sedative).  A medicine to numb the area (local anesthetic).  A medicine to make you fall asleep (general anesthetic).  A medicine that is injected into an area of your body to numb everything below the injection site (regional anesthetic).  During the surgery:  Small incisions or a puncture will be made on one or both sides of the groin. Long, thin tubes (catheters) will be passed through the opening, put into the artery in your thigh, and moved up into the aneurysm in the aorta.  The health care provider will use live X-ray pictures to guide the endograft through the catheterto the place where the aneurysm is.  The endograft will be released to seal off the aneurysm and to line the aorta. It will keep blood from flowing into the aneurysm and will help keep it from rupturing. The endograft will stay in place and will not be taken out.  X-rays will be used to check where the endograft is placed and to make sure that it is where it should be.  The catheter will be taken out, and the incision will be closed with stitches (sutures). The procedure may vary among health care providers and hospitals. What happens after the procedure?  Your blood pressure, heart rate, breathing rate, and blood oxygen level will be monitored until the medicines you were given have worn off.  You will need to lie flat for a number of hours. Bending your legs can cause them to bleed and swell.  You will then be urged to get up and move around a number of times each day and to slowly become more active.  You will be given medicines to control pain.  Certain tests may be done after your procedure to check how well the endograft is working and to check its placement.  Do  not drive for 24 hours if you received a sedative. This information is not intended to replace advice given to you by your health care provider. Make sure you discuss any questions you have with your health care provider. Document Released: 03/13/2009 Document Revised: 05/14/2016 Document Reviewed: 01/19/2016 Elsevier Interactive Patient Education  2017 [employer].

## 2016-12-21 NOTE — Assessment & Plan Note (Signed)
He has had a CT scan which I have independently reviewed since his last visit. This demonstrates an approximately 5.2 cm infrarenal abdominal aortic aneurysm. 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 This traverses down into the right hypogastric artery. The left common iliac artery is mildly enlarged but not as large as the right. There is no evidence of extravasation or rupture. We had a long discussion today about the options for treatment. His aneurysm will be amenable to endovascular repair, but will require a preliminary coil embolization of the right hypogastric artery secondary to the aneurysm extending throughout the right common iliac artery and down into the hypogastric artery which would not allow successful sealing of the stent graft and normal tissue without embolization of the right hypogastric artery. His left hypogastric artery appears reasonably normal, so I do not think that there will be a major downfall to coil embolization of the right hypogastric artery. We would plan to perform this next week, and then either the week after or 2 weeks after the coil embolization he would have his endovascular aneurysm repair. I have discussed both procedures in detail including the risks and benefits. I discussed the differences between endovascular therapy and open therapy. He and his wife were strongly desirous of an endovascular repair. All questions were answered and he agrees with our plan of care.

## 2016-12-23 ENCOUNTER — Encounter
Admission: RE | Admit: 2016-12-23 | Discharge: 2016-12-23 | Disposition: A | Discharge status: Home / Self Care | Payer: PPO | Source: Ambulatory Visit | Attending: Vascular Surgery | Admitting: Vascular Surgery

## 2016-12-23 ENCOUNTER — Other Ambulatory Visit (INDEPENDENT_AMBULATORY_CARE_PROVIDER_SITE_OTHER): Payer: Self-pay

## 2016-12-23 ENCOUNTER — Encounter (INDEPENDENT_AMBULATORY_CARE_PROVIDER_SITE_OTHER): Payer: Self-pay

## 2016-12-23 DIAGNOSIS — R9431 Abnormal electrocardiogram [ECG] [EKG]: Secondary | ICD-10-CM | POA: Insufficient documentation

## 2016-12-23 DIAGNOSIS — I252 Old myocardial infarction: Secondary | ICD-10-CM | POA: Diagnosis not present

## 2016-12-23 DIAGNOSIS — Z01812 Encounter for preprocedural laboratory examination: Secondary | ICD-10-CM | POA: Diagnosis not present

## 2016-12-23 DIAGNOSIS — Z01810 Encounter for preprocedural cardiovascular examination: Secondary | ICD-10-CM | POA: Diagnosis not present

## 2016-12-23 DIAGNOSIS — I714 Abdominal aortic aneurysm, without rupture, unspecified: Secondary | ICD-10-CM

## 2016-12-23 DIAGNOSIS — Z01818 Encounter for other preprocedural examination: Secondary | ICD-10-CM | POA: Diagnosis not present

## 2016-12-23 HISTORY — DX: Aneurysm of unspecified site: I72.9

## 2016-12-23 LAB — BASIC METABOLIC PANEL
ANION GAP: 6 (ref 5–15)
BUN: 19 mg/dL (ref 6–20)
CO2: 28 mmol/L (ref 22–32)
Calcium: 9.1 mg/dL (ref 8.9–10.3)
Chloride: 106 mmol/L (ref 101–111)
Creatinine, Ser: 1.63 mg/dL — ABNORMAL HIGH (ref 0.61–1.24)
GFR calc Af Amer: 46 mL/min — ABNORMAL LOW (ref >60)
GFR, EST NON AFRICAN AMERICAN: 39 mL/min — AB (ref >60)
Glucose, Bld: 87 mg/dL (ref 65–99)
POTASSIUM: 4.4 mmol/L (ref 3.5–5.1)
SODIUM: 140 mmol/L (ref 135–145)

## 2016-12-23 LAB — URINALYSIS, COMPLETE (UACMP) WITH MICROSCOPIC
Bilirubin Urine: NEGATIVE
Glucose, UA: NEGATIVE mg/dL
Hgb urine dipstick: NEGATIVE
Ketones, ur: NEGATIVE mg/dL
LEUKOCYTES UA: NEGATIVE
NITRITE: NEGATIVE
Protein, ur: NEGATIVE mg/dL
SPECIFIC GRAVITY, URINE: 1.023 (ref 1.005–1.030)
pH: 6 (ref 5.0–8.0)

## 2016-12-23 LAB — SURGICAL PCR SCREEN
MRSA, PCR: NEGATIVE
Staphylococcus aureus: NEGATIVE

## 2016-12-23 LAB — CBC
HEMATOCRIT: 42.2 % (ref 40.0–52.0)
HEMOGLOBIN: 14 g/dL (ref 13.0–18.0)
MCH: 29.4 pg (ref 26.0–34.0)
MCHC: 33.2 g/dL (ref 32.0–36.0)
MCV: 88.5 fL (ref 80.0–100.0)
Platelets: 177 10*3/uL (ref 150–440)
RBC: 4.77 MIL/uL (ref 4.40–5.90)
RDW: 14.5 % (ref 11.5–14.5)
WBC: 5.9 10*3/uL (ref 3.8–10.6)

## 2016-12-23 LAB — PROTIME-INR
INR: 1.01
Prothrombin Time: 13.3 seconds (ref 11.4–15.2)

## 2016-12-23 LAB — APTT: APTT: 30 s (ref 24–36)

## 2016-12-23 NOTE — Patient Instructions (Addendum)
  Your procedure is scheduled on: 12/27/16 Mon. '@7'$ :30am Report to [place] 1st floor  medical mall [facility] Entrance)  Remember: Instructions that are not followed completely may result in serious medical risk, up to and including death, or upon the discretion of your surgeon and anesthesiologist your surgery may need to be rescheduled.    _x___ 1. Do not eat food or drink liquids after midnight. No gum chewing or hard candies.     __x__ 2. No Alcohol for 24 hours before or after surgery.   __x__3. No Smoking for 24 prior to surgery.   ____  4. Bring all medications with you on the day of surgery if instructed.    __x__ 5. Notify your doctor if there is any change in your medical condition     (cold, fever, infections).     Do not wear jewelry, make-up, hairpins, clips or nail polish.  Do not wear lotions, powders, or perfumes. You may wear deodorant.  Do not shave 48 hours prior to surgery. Men may shave face and neck.  Do not bring valuables to the hospital.    [facility] is not responsible for any belongings or valuables.               Contacts, dentures or bridgework may not be worn into surgery.  Leave your suitcase in the car. After surgery it may be brought to your room.  For patients admitted to the hospital, discharge time is determined by your treatment team.   Patients discharged the day of surgery will not be allowed to drive home.  You will need someone to drive you home and stay with you the night of your procedure.    Please read over the following fact sheets that you were given:   [facility] Preparing for Surgery and or MRSA Information   _x___ Take these medicines the morning of surgery with A SIP OF WATER:    1. omeprazole (PRILOSEC)   2.lisinopril (PRINIVIL  3.  4.  5.  6.  ____Fleets enema or Magnesium Citrate as directed.   _x___ Use CHG Soap or sage wipes as directed on instruction sheet   ____ Use inhalers on the day of surgery and  bring to hospital day of surgery  ____ Stop metformin 2 days prior to surgery    ____ Take 1/2 of usual insulin dose the night before surgery and none on the morning of           surgery.   _x___  Do not stop aspirin or plavix for upcoming surgery on 12/27/16. Check with the nurses in [place] regarding the need to stop Plavix and or Aspirin for surgery on 01/05/17  x__ Stop Anti-inflammatories such as Advil, Aleve, Ibuprofen, Motrin, Naproxen,          Naprosyn, Goodies powders or aspirin products. Ok to take Tylenol.   _x___ Stop supplements until after surgery.  Stop fish oil today  ____ Bring C-Pap to the hospital.

## 2016-12-23 NOTE — Pre-Procedure Instructions (Signed)
Called anesthesia with EKG report, cardiac clearance requested.  Has appointment for cardiac clearance on 12/24/16 @ 12:45 with Dr Nehemiah Massed.  Dr Bunnie Domino office informed of need for clearance.

## 2016-12-24 DIAGNOSIS — I25810 Atherosclerosis of coronary artery bypass graft(s) without angina pectoris: Secondary | ICD-10-CM | POA: Diagnosis not present

## 2016-12-24 DIAGNOSIS — Z01818 Encounter for other preprocedural examination: Secondary | ICD-10-CM | POA: Diagnosis not present

## 2016-12-24 DIAGNOSIS — E782 Mixed hyperlipidemia: Secondary | ICD-10-CM | POA: Diagnosis not present

## 2016-12-24 DIAGNOSIS — I10 Essential (primary) hypertension: Secondary | ICD-10-CM | POA: Diagnosis not present

## 2016-12-24 DIAGNOSIS — I495 Sick sinus syndrome: Secondary | ICD-10-CM | POA: Diagnosis not present

## 2016-12-26 ENCOUNTER — Encounter: Payer: Self-pay | Admitting: Hematology and Oncology

## 2016-12-27 ENCOUNTER — Encounter
Admission: RE | Disposition: A | Discharge status: Home / Self Care | Payer: Self-pay | Source: Ambulatory Visit | Attending: Vascular Surgery

## 2016-12-27 ENCOUNTER — Ambulatory Visit
Admission: RE | Admit: 2016-12-27 | Discharge: 2016-12-27 | Disposition: A | Discharge status: Home / Self Care | Payer: PPO | Source: Ambulatory Visit | Attending: Vascular Surgery | Admitting: Vascular Surgery

## 2016-12-27 DIAGNOSIS — Z9079 Acquired absence of other genital organ(s): Secondary | ICD-10-CM | POA: Insufficient documentation

## 2016-12-27 DIAGNOSIS — Z87891 Personal history of nicotine dependence: Secondary | ICD-10-CM | POA: Diagnosis not present

## 2016-12-27 DIAGNOSIS — Z902 Acquired absence of lung [part of]: Secondary | ICD-10-CM | POA: Insufficient documentation

## 2016-12-27 DIAGNOSIS — E7800 Pure hypercholesterolemia, unspecified: Secondary | ICD-10-CM | POA: Diagnosis not present

## 2016-12-27 DIAGNOSIS — Z950 Presence of cardiac pacemaker: Secondary | ICD-10-CM | POA: Insufficient documentation

## 2016-12-27 DIAGNOSIS — I441 Atrioventricular block, second degree: Secondary | ICD-10-CM | POA: Insufficient documentation

## 2016-12-27 DIAGNOSIS — G5710 Meralgia paresthetica, unspecified lower limb: Secondary | ICD-10-CM | POA: Insufficient documentation

## 2016-12-27 DIAGNOSIS — J449 Chronic obstructive pulmonary disease, unspecified: Secondary | ICD-10-CM | POA: Insufficient documentation

## 2016-12-27 DIAGNOSIS — N261 Atrophy of kidney (terminal): Secondary | ICD-10-CM | POA: Diagnosis not present

## 2016-12-27 DIAGNOSIS — Z8249 Family history of ischemic heart disease and other diseases of the circulatory system: Secondary | ICD-10-CM | POA: Insufficient documentation

## 2016-12-27 DIAGNOSIS — Z8673 Personal history of transient ischemic attack (TIA), and cerebral infarction without residual deficits: Secondary | ICD-10-CM | POA: Insufficient documentation

## 2016-12-27 DIAGNOSIS — Z96653 Presence of artificial knee joint, bilateral: Secondary | ICD-10-CM | POA: Diagnosis not present

## 2016-12-27 DIAGNOSIS — Z85118 Personal history of other malignant neoplasm of bronchus and lung: Secondary | ICD-10-CM | POA: Diagnosis not present

## 2016-12-27 DIAGNOSIS — Z8546 Personal history of malignant neoplasm of prostate: Secondary | ICD-10-CM | POA: Insufficient documentation

## 2016-12-27 DIAGNOSIS — Z96612 Presence of left artificial shoulder joint: Secondary | ICD-10-CM | POA: Diagnosis not present

## 2016-12-27 DIAGNOSIS — I714 Abdominal aortic aneurysm, without rupture, unspecified: Secondary | ICD-10-CM

## 2016-12-27 DIAGNOSIS — I429 Cardiomyopathy, unspecified: Secondary | ICD-10-CM | POA: Diagnosis not present

## 2016-12-27 DIAGNOSIS — I723 Aneurysm of iliac artery: Secondary | ICD-10-CM | POA: Diagnosis not present

## 2016-12-27 DIAGNOSIS — Z7902 Long term (current) use of antithrombotics/antiplatelets: Secondary | ICD-10-CM | POA: Diagnosis not present

## 2016-12-27 DIAGNOSIS — I728 Aneurysm of other specified arteries: Secondary | ICD-10-CM | POA: Diagnosis not present

## 2016-12-27 DIAGNOSIS — E785 Hyperlipidemia, unspecified: Secondary | ICD-10-CM | POA: Insufficient documentation

## 2016-12-27 DIAGNOSIS — Z803 Family history of malignant neoplasm of breast: Secondary | ICD-10-CM | POA: Insufficient documentation

## 2016-12-27 DIAGNOSIS — N183 Chronic kidney disease, stage 3 (moderate): Secondary | ICD-10-CM | POA: Diagnosis not present

## 2016-12-27 DIAGNOSIS — K219 Gastro-esophageal reflux disease without esophagitis: Secondary | ICD-10-CM | POA: Diagnosis not present

## 2016-12-27 DIAGNOSIS — Z9842 Cataract extraction status, left eye: Secondary | ICD-10-CM | POA: Diagnosis not present

## 2016-12-27 DIAGNOSIS — Z9841 Cataract extraction status, right eye: Secondary | ICD-10-CM | POA: Insufficient documentation

## 2016-12-27 DIAGNOSIS — I129 Hypertensive chronic kidney disease with stage 1 through stage 4 chronic kidney disease, or unspecified chronic kidney disease: Secondary | ICD-10-CM | POA: Diagnosis not present

## 2016-12-27 DIAGNOSIS — M1990 Unspecified osteoarthritis, unspecified site: Secondary | ICD-10-CM | POA: Insufficient documentation

## 2016-12-27 DIAGNOSIS — Z86010 Personal history of colonic polyps: Secondary | ICD-10-CM | POA: Diagnosis not present

## 2016-12-27 DIAGNOSIS — Z825 Family history of asthma and other chronic lower respiratory diseases: Secondary | ICD-10-CM | POA: Insufficient documentation

## 2016-12-27 DIAGNOSIS — Z7982 Long term (current) use of aspirin: Secondary | ICD-10-CM | POA: Insufficient documentation

## 2016-12-27 HISTORY — PX: EMBOLIZATION: CATH118239

## 2016-12-27 SURGERY — EMBOLIZATION
Anesthesia: Moderate Sedation | Laterality: Right

## 2016-12-27 MED ORDER — CEFAZOLIN IN D5W 1 GM/50ML IV SOLN
INTRAVENOUS | Status: AC
  Filled 2016-12-27: qty 50

## 2016-12-27 MED ORDER — CEFAZOLIN IN D5W 1 GM/50ML IV SOLN: 1.0000 g | Freq: Once | INTRAVENOUS | Status: DC

## 2016-12-27 MED ORDER — SODIUM CHLORIDE 0.9 % IV SOLN: 500.0000 mL | Freq: Once | INTRAVENOUS | Status: DC | PRN

## 2016-12-27 MED ORDER — GUAIFENESIN-DM 100-10 MG/5ML PO SYRP: 15.0000 mL | ORAL_SOLUTION | ORAL | Status: DC | PRN

## 2016-12-27 MED ORDER — LIDOCAINE-EPINEPHRINE (PF) 2 %-1:200000 IJ SOLN
INTRAMUSCULAR | Status: AC
  Filled 2016-12-27: qty 20

## 2016-12-27 MED ORDER — METOPROLOL TARTRATE 5 MG/5ML IV SOLN: 2.0000 mg | INTRAVENOUS | Status: DC | PRN

## 2016-12-27 MED ORDER — CEFAZOLIN IN D5W 1 GM/50ML IV SOLN
1.0000 g | Freq: Once | INTRAVENOUS | Status: AC
  Administered 2016-12-27: 1 g via INTRAVENOUS

## 2016-12-27 MED ORDER — OXYCODONE-ACETAMINOPHEN 5-325 MG PO TABS: 1.0000 | ORAL_TABLET | ORAL | Status: DC | PRN

## 2016-12-27 MED ORDER — HEPARIN SODIUM (PORCINE) 1000 UNIT/ML IJ SOLN
INTRAMUSCULAR | Status: AC
  Filled 2016-12-27: qty 1

## 2016-12-27 MED ORDER — FENTANYL CITRATE (PF) 100 MCG/2ML IJ SOLN
INTRAMUSCULAR | Status: DC | PRN
  Administered 2016-12-27: 50 ug via INTRAVENOUS
  Administered 2016-12-27: 25 ug via INTRAVENOUS
  Administered 2016-12-27: 50 ug via INTRAVENOUS
  Administered 2016-12-27: 25 ug via INTRAVENOUS

## 2016-12-27 MED ORDER — MIDAZOLAM HCL 2 MG/2ML IJ SOLN
INTRAMUSCULAR | Status: DC | PRN
  Administered 2016-12-27 (×2): 1 mg via INTRAVENOUS
  Administered 2016-12-27: 2 mg via INTRAVENOUS
  Administered 2016-12-27 (×2): 1 mg via INTRAVENOUS

## 2016-12-27 MED ORDER — MIDAZOLAM HCL 2 MG/2ML IJ SOLN
INTRAMUSCULAR | Status: AC
  Filled 2016-12-27: qty 2

## 2016-12-27 MED ORDER — SODIUM CHLORIDE 0.9 % IV SOLN: INTRAVENOUS | Status: DC

## 2016-12-27 MED ORDER — MIDAZOLAM HCL 5 MG/5ML IJ SOLN
INTRAMUSCULAR | Status: AC
  Filled 2016-12-27: qty 5

## 2016-12-27 MED ORDER — ONDANSETRON HCL 4 MG/2ML IJ SOLN: 4.0000 mg | Freq: Four times a day (QID) | INTRAMUSCULAR | Status: DC | PRN

## 2016-12-27 MED ORDER — LABETALOL HCL 5 MG/ML IV SOLN: 10.0000 mg | INTRAVENOUS | Status: DC | PRN

## 2016-12-27 MED ORDER — PHENOL 1.4 % MT LIQD
1.0000 | OROMUCOSAL | Status: DC | PRN
  Filled 2016-12-27: qty 177

## 2016-12-27 MED ORDER — HEPARIN SODIUM (PORCINE) 1000 UNIT/ML IJ SOLN
INTRAMUSCULAR | Status: DC | PRN
  Administered 2016-12-27: 3000 [IU] via INTRAVENOUS

## 2016-12-27 MED ORDER — FENTANYL CITRATE (PF) 100 MCG/2ML IJ SOLN
INTRAMUSCULAR | Status: AC
  Filled 2016-12-27: qty 2

## 2016-12-27 MED ORDER — IOPAMIDOL (ISOVUE-300) INJECTION 61%
INTRAVENOUS | Status: DC | PRN
  Administered 2016-12-27: 60 mL via INTRAVENOUS

## 2016-12-27 MED ORDER — HYDRALAZINE HCL 20 MG/ML IJ SOLN: 5.0000 mg | INTRAMUSCULAR | Status: DC | PRN

## 2016-12-27 MED ORDER — HEPARIN (PORCINE) IN NACL 2-0.9 UNIT/ML-% IJ SOLN
INTRAMUSCULAR | Status: AC
  Filled 2016-12-27: qty 1000

## 2016-12-27 MED ORDER — ACETAMINOPHEN 325 MG PO TABS
325.0000 mg | ORAL_TABLET | ORAL | Status: DC | PRN
Start: 2016-12-27 — End: 2016-12-27

## 2016-12-27 MED ORDER — ACETAMINOPHEN 325 MG RE SUPP
325.0000 mg | RECTAL | Status: DC | PRN
  Filled 2016-12-27: qty 2

## 2016-12-27 MED ORDER — HYDROMORPHONE HCL 1 MG/ML IJ SOLN: 0.5000 mg | INTRAMUSCULAR | Status: DC | PRN

## 2016-12-27 SURGICAL SUPPLY — 15 items
CATH KA2 5FR 65CM (CATHETERS) ×4 IMPLANT
CATH LANTERN 025 115CM 45TIP (MISCELLANEOUS) ×2 IMPLANT
COIL 400 COMPLEX SOFT 10X35CM (Vascular Products) ×2 IMPLANT
COIL 400 COMPLEX STD 10X35CM (Vascular Products) ×4 IMPLANT
COIL 400 COMPLEX STD 7X25CM (Vascular Products) ×2 IMPLANT
COIL 400 COMPLEX STD 8X25CM (Vascular Products) ×2 IMPLANT
COIL 400 COMPLEX STD 9X30CM (Vascular Products) ×2 IMPLANT
DEVICE PRESTO INFLATION (MISCELLANEOUS) ×2 IMPLANT
DEVICE STARCLOSE SE CLOSURE (Vascular Products) ×2 IMPLANT
GLIDEWIRE ADV .035X180CM (WIRE) ×4 IMPLANT
HANDLE DETACHMENT COIL (MISCELLANEOUS) ×2 IMPLANT
PACK ANGIOGRAPHY (CUSTOM PROCEDURE TRAY) ×2 IMPLANT
SHEATH BRITE TIP 5FRX11 (SHEATH) ×2 IMPLANT
TOWEL OR 17X26 4PK STRL BLUE (TOWEL DISPOSABLE) ×2 IMPLANT
WIRE J 3MM .035X145CM (WIRE) ×2 IMPLANT

## 2016-12-27 NOTE — Pre-Procedure Instructions (Signed)
CLEARANCE BY DR [place] RISK 12/24/16

## 2016-12-27 NOTE — Op Note (Signed)
Rick Mcbride Percutaneous Study/Intervention Procedural Note   12/27/2016  Surgeon(s): [employer]  Assistants: none  Pre-operative Diagnosis: 1. 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 Ultrasound guidance for vascular access left femoral artery 2. Catheter placement into tertiary right hypogastric artery branches from left femoral approach 3. Aortogram and selective angiogram of the right hypogastric artery 4. Coil embolization of the right hypogastric artery using 6 Penumbra Ruby coils 5. 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 On the left side, the left common iliac artery was mildly aneurysmal but not extending down to the hypogastric artery. Coil embolization of the right hypogastric artery is necessary to allow successful stent graft placement. The patient is brought in for angiography for further evaluation and potential treatment. Risks and benefits are discussed and informed consent is obtained  Procedure: The patient was identified and appropriate procedural time out was performed. The patient was then placed supine on the table and prepped and draped in the usual sterile fashion.Moderate conscious sedation was administered during a face to face encounter with the patient throughout the procedure with my supervision of the RN administering medicines and monitoring the patient's vital signs, pulse oximetry, telemetry and mental status throughout from the  start of the procedure until the patient was taken to the recovery room.  Ultrasound was used to evaluate the left  common femoral artery. It was patent . A digital ultrasound image was acquired. A Seldinger needle was used to access the left  common femoral artery under direct ultrasound guidance and a permanent image was performed. A 0.035 J wire was advanced without resistance and a 5Fr sheath was placed. Pigtail catheter was placed into the aorta and an AP aortogram was performed. This demonstrated patent renal arteries with aneurysmal disease of the aorta and bilateral iliac arteries worse on the right extending down to the right hypogastric artery. The patient was given 3000 units of intravenous heparin during the procedure.  I then crossed the aortic bifurcation with the pigtail catheter.  I then advanced into the right hypogastric artery with an Advantage wire and a Kumpe catheter.  Selective imaging was performed in the right hypogastric artery and I advanced the artery beyond its initial branch. There were then 4-5 secondary branches and I advanced into the largest of these with a microcatheter and advanced beyond its branches and what would be a tertiary hypogastric artery branch. We gained good purchase and then slowly pulled back the microcatheter and began treatment starting in the secondary hypogastric artery branch that was the largest. I then proceeded with coil embolization.  Initially, I used one 7 and one 8 mm coil and began packing the secondary branch of the hypogastric artery and back into the primary right hypogastric artery just beyond its primary bifurcation into the larger branches. Then, I used one 9 mm coil and three 10 mm coils until the primary right hypogastric artery proximal to the initial branch about 2 cm beyond the origin of the hypogastric artery was successfully occluded on completion imaging with the catheter at the origin of the right hypogastric artery.  I  elected to  terminate the procedure. The diagnostic catheter was removed. StarClose closure device was deployed in usual fashion with excellent hemostatic result. The patient was taken to the recovery room in stable condition having tolerated the procedure well.     Findings: Successful occlusion of the right hypogastric artery with expected aortoiliac aneurysms.  Disposition: Patient was taken to the recovery room in stable condition having tolerated the procedure well.  Complications: None  Rick Pain, MD 12/27/2016 9:43 AM   This note was created with Dragon Medical transcription system. Any errors in dictation are purely unintentional.

## 2016-12-27 NOTE — H&P (Signed)
[facility] History & Physical Update  The patient was interviewed and re-examined.  The patient's previous History and Physical has been reviewed and is unchanged.  There is no change in the plan of care. We plan to proceed with the scheduled procedure.  [name], MD  12/27/2016, 8:13 AM

## 2016-12-27 NOTE — Discharge Instructions (Signed)

## 2016-12-27 NOTE — Progress Notes (Signed)
Procedure done, pt tolerated well. Vitals remained stable throughout entire procedure.will transfer pt to recovery to recover prior to discharge.

## 2016-12-27 NOTE — Progress Notes (Signed)
Pt here today for embolization per Dr Lucky Cowboy. Vitals stable, will monitor vitals as well as etco2 throughout entire procedure.

## 2016-12-28 ENCOUNTER — Encounter: Payer: Self-pay | Admitting: Vascular Surgery

## 2017-01-04 DIAGNOSIS — I442 Atrioventricular block, complete: Secondary | ICD-10-CM | POA: Diagnosis not present

## 2017-01-05 ENCOUNTER — Ambulatory Visit: Payer: PPO

## 2017-01-05 ENCOUNTER — Inpatient Hospital Stay
Admission: RE | Admit: 2017-01-05 | Discharge: 2017-01-06 | DRG: 269 | Disposition: A | Discharge status: Home / Self Care | Payer: PPO | Source: Ambulatory Visit | Attending: Vascular Surgery | Admitting: Vascular Surgery

## 2017-01-05 ENCOUNTER — Encounter
Admission: RE | Disposition: A | Discharge status: Home / Self Care | Payer: Self-pay | Source: Ambulatory Visit | Attending: Vascular Surgery

## 2017-01-05 ENCOUNTER — Ambulatory Visit: Payer: PPO | Admitting: Registered Nurse

## 2017-01-05 DIAGNOSIS — D649 Anemia, unspecified: Secondary | ICD-10-CM | POA: Diagnosis present

## 2017-01-05 DIAGNOSIS — N183 Chronic kidney disease, stage 3 (moderate): Secondary | ICD-10-CM | POA: Diagnosis present

## 2017-01-05 DIAGNOSIS — I25810 Atherosclerosis of coronary artery bypass graft(s) without angina pectoris: Secondary | ICD-10-CM | POA: Diagnosis not present

## 2017-01-05 DIAGNOSIS — Z7982 Long term (current) use of aspirin: Secondary | ICD-10-CM

## 2017-01-05 DIAGNOSIS — Z85118 Personal history of other malignant neoplasm of bronchus and lung: Secondary | ICD-10-CM

## 2017-01-05 DIAGNOSIS — Z8673 Personal history of transient ischemic attack (TIA), and cerebral infarction without residual deficits: Secondary | ICD-10-CM

## 2017-01-05 DIAGNOSIS — J449 Chronic obstructive pulmonary disease, unspecified: Secondary | ICD-10-CM | POA: Diagnosis not present

## 2017-01-05 DIAGNOSIS — E785 Hyperlipidemia, unspecified: Secondary | ICD-10-CM | POA: Diagnosis not present

## 2017-01-05 DIAGNOSIS — Z902 Acquired absence of lung [part of]: Secondary | ICD-10-CM | POA: Diagnosis not present

## 2017-01-05 DIAGNOSIS — E7800 Pure hypercholesterolemia, unspecified: Secondary | ICD-10-CM | POA: Diagnosis not present

## 2017-01-05 DIAGNOSIS — Z96612 Presence of left artificial shoulder joint: Secondary | ICD-10-CM | POA: Diagnosis not present

## 2017-01-05 DIAGNOSIS — Z7902 Long term (current) use of antithrombotics/antiplatelets: Secondary | ICD-10-CM

## 2017-01-05 DIAGNOSIS — Z8679 Personal history of other diseases of the circulatory system: Secondary | ICD-10-CM | POA: Diagnosis not present

## 2017-01-05 DIAGNOSIS — Z8249 Family history of ischemic heart disease and other diseases of the circulatory system: Secondary | ICD-10-CM | POA: Diagnosis not present

## 2017-01-05 DIAGNOSIS — Z9841 Cataract extraction status, right eye: Secondary | ICD-10-CM | POA: Diagnosis not present

## 2017-01-05 DIAGNOSIS — Z87891 Personal history of nicotine dependence: Secondary | ICD-10-CM

## 2017-01-05 DIAGNOSIS — Z8546 Personal history of malignant neoplasm of prostate: Secondary | ICD-10-CM | POA: Diagnosis not present

## 2017-01-05 DIAGNOSIS — I714 Abdominal aortic aneurysm, without rupture, unspecified: Secondary | ICD-10-CM

## 2017-01-05 DIAGNOSIS — Z9842 Cataract extraction status, left eye: Secondary | ICD-10-CM | POA: Diagnosis not present

## 2017-01-05 DIAGNOSIS — K219 Gastro-esophageal reflux disease without esophagitis: Secondary | ICD-10-CM | POA: Diagnosis present

## 2017-01-05 DIAGNOSIS — Z950 Presence of cardiac pacemaker: Secondary | ICD-10-CM | POA: Diagnosis not present

## 2017-01-05 DIAGNOSIS — I739 Peripheral vascular disease, unspecified: Secondary | ICD-10-CM | POA: Diagnosis not present

## 2017-01-05 DIAGNOSIS — Z79899 Other long term (current) drug therapy: Secondary | ICD-10-CM | POA: Diagnosis not present

## 2017-01-05 HISTORY — PX: ENDOVASCULAR REPAIR/STENT GRAFT: CATH118280

## 2017-01-05 LAB — MRSA PCR SCREENING: MRSA by PCR: NEGATIVE

## 2017-01-05 LAB — GLUCOSE, CAPILLARY: GLUCOSE-CAPILLARY: 92 mg/dL (ref 65–99)

## 2017-01-05 SURGERY — ENDOVASCULAR REPAIR/STENT GRAFT
Anesthesia: General

## 2017-01-05 MED ORDER — SODIUM CHLORIDE 0.9 % IJ SOLN
INTRAMUSCULAR | Status: AC
  Filled 2017-01-05: qty 10

## 2017-01-05 MED ORDER — SUCCINYLCHOLINE CHLORIDE 20 MG/ML IJ SOLN
INTRAMUSCULAR | Status: AC
  Filled 2017-01-05: qty 1

## 2017-01-05 MED ORDER — SEVOFLURANE IN SOLN
RESPIRATORY_TRACT | Status: AC
  Filled 2017-01-05: qty 250

## 2017-01-05 MED ORDER — HEPARIN SODIUM (PORCINE) 1000 UNIT/ML IJ SOLN
INTRAMUSCULAR | Status: DC | PRN
  Administered 2017-01-05: 5000 [IU] via INTRAVENOUS

## 2017-01-05 MED ORDER — ACETAMINOPHEN 650 MG RE SUPP: 325.0000 mg | RECTAL | Status: DC | PRN

## 2017-01-05 MED ORDER — ADULT MULTIVITAMIN W/MINERALS CH
1.0000 | ORAL_TABLET | Freq: Every day | ORAL | Status: DC
Start: 2017-01-05 — End: 2017-01-06
  Administered 2017-01-06: 1 via ORAL
  Filled 2017-01-05 (×3): qty 1

## 2017-01-05 MED ORDER — LORATADINE 10 MG PO TABS
10.0000 mg | ORAL_TABLET | Freq: Every day | ORAL | Status: DC
  Administered 2017-01-05 – 2017-01-06 (×2): 10 mg via ORAL
  Filled 2017-01-05 (×2): qty 1

## 2017-01-05 MED ORDER — PHENYLEPHRINE HCL 10 MG/ML IJ SOLN
INTRAMUSCULAR | Status: AC
Start: 2017-01-05 — End: 2017-01-05
  Filled 2017-01-05: qty 1

## 2017-01-05 MED ORDER — MIDAZOLAM HCL 2 MG/2ML IJ SOLN
INTRAMUSCULAR | Status: AC
  Filled 2017-01-05: qty 2

## 2017-01-05 MED ORDER — ZOLPIDEM TARTRATE 5 MG PO TABS
5.0000 mg | ORAL_TABLET | Freq: Every day | ORAL | Status: DC
  Administered 2017-01-05: 5 mg via ORAL
  Filled 2017-01-05: qty 1

## 2017-01-05 MED ORDER — GUAIFENESIN-DM 100-10 MG/5ML PO SYRP: 15.0000 mL | ORAL_SOLUTION | ORAL | Status: DC | PRN

## 2017-01-05 MED ORDER — SODIUM CHLORIDE 0.9 % IV SOLN: INTRAVENOUS | Status: DC

## 2017-01-05 MED ORDER — DONEPEZIL HCL 5 MG PO TABS
5.0000 mg | ORAL_TABLET | Freq: Every day | ORAL | Status: DC
  Administered 2017-01-05: 5 mg via ORAL
  Filled 2017-01-05: qty 1

## 2017-01-05 MED ORDER — SUGAMMADEX SODIUM 200 MG/2ML IV SOLN
INTRAVENOUS | Status: DC | PRN
  Administered 2017-01-05: 200 mg via INTRAVENOUS

## 2017-01-05 MED ORDER — HEPARIN SODIUM (PORCINE) 1000 UNIT/ML IJ SOLN
INTRAMUSCULAR | Status: AC
  Filled 2017-01-05: qty 1

## 2017-01-05 MED ORDER — ATORVASTATIN CALCIUM 20 MG PO TABS
40.0000 mg | ORAL_TABLET | Freq: Every day | ORAL | Status: DC
Start: 2017-01-05 — End: 2017-01-06
  Administered 2017-01-05: 40 mg via ORAL
  Filled 2017-01-05 (×2): qty 2

## 2017-01-05 MED ORDER — CYCLOBENZAPRINE HCL 10 MG PO TABS: 10.0000 mg | ORAL_TABLET | Freq: Three times a day (TID) | ORAL | Status: DC | PRN

## 2017-01-05 MED ORDER — CEFAZOLIN SODIUM-DEXTROSE 2-4 GM/100ML-% IV SOLN: 2.0000 g | Freq: Once | INTRAVENOUS | Status: DC

## 2017-01-05 MED ORDER — ROCURONIUM BROMIDE 50 MG/5ML IV SOLN
INTRAVENOUS | Status: AC
  Filled 2017-01-05: qty 1

## 2017-01-05 MED ORDER — SODIUM CHLORIDE 0.9 % IV SOLN: 500.0000 mL | Freq: Once | INTRAVENOUS | Status: DC | PRN

## 2017-01-05 MED ORDER — DOPAMINE-DEXTROSE 3.2-5 MG/ML-% IV SOLN: 3.0000 ug/kg/min | INTRAVENOUS | Status: DC

## 2017-01-05 MED ORDER — LABETALOL HCL 5 MG/ML IV SOLN: 10.0000 mg | INTRAVENOUS | Status: DC | PRN

## 2017-01-05 MED ORDER — NIACIN ER 500 MG PO TBCR
500.0000 mg | EXTENDED_RELEASE_TABLET | Freq: Every day | ORAL | Status: DC
  Administered 2017-01-05 – 2017-01-06 (×2): 500 mg via ORAL
  Filled 2017-01-05 (×2): qty 1

## 2017-01-05 MED ORDER — OXYCODONE-ACETAMINOPHEN 5-325 MG PO TABS
1.0000 | ORAL_TABLET | ORAL | Status: DC | PRN
  Administered 2017-01-05: 1 via ORAL
  Administered 2017-01-05: 2 via ORAL
  Administered 2017-01-05: 1 via ORAL
  Filled 2017-01-05: qty 2
  Filled 2017-01-05 (×2): qty 1

## 2017-01-05 MED ORDER — MAGNESIUM SULFATE 2 GM/50ML IV SOLN: 2.0000 g | Freq: Every day | INTRAVENOUS | Status: DC | PRN

## 2017-01-05 MED ORDER — DOCUSATE SODIUM 100 MG PO CAPS
100.0000 mg | ORAL_CAPSULE | Freq: Every day | ORAL | Status: DC
  Administered 2017-01-06: 100 mg via ORAL
  Filled 2017-01-05: qty 1

## 2017-01-05 MED ORDER — IOPAMIDOL (ISOVUE-300) INJECTION 61%
INTRAVENOUS | Status: DC | PRN
  Administered 2017-01-05: 50 mL via INTRAVENOUS

## 2017-01-05 MED ORDER — CEFAZOLIN SODIUM 1 G IJ SOLR
INTRAMUSCULAR | Status: AC
  Filled 2017-01-05: qty 20

## 2017-01-05 MED ORDER — ONDANSETRON HCL 4 MG/2ML IJ SOLN
INTRAMUSCULAR | Status: DC | PRN
  Administered 2017-01-05: 4 mg via INTRAVENOUS

## 2017-01-05 MED ORDER — SODIUM CHLORIDE 0.9 % IV SOLN
INTRAVENOUS | Status: DC
  Administered 2017-01-05: 07:00:00 via INTRAVENOUS

## 2017-01-05 MED ORDER — FAMOTIDINE IN NACL 20-0.9 MG/50ML-% IV SOLN
20.0000 mg | Freq: Two times a day (BID) | INTRAVENOUS | Status: DC
  Administered 2017-01-05 (×2): 20 mg via INTRAVENOUS
  Filled 2017-01-05 (×2): qty 50

## 2017-01-05 MED ORDER — OCUVITE-LUTEIN PO CAPS
1.0000 | ORAL_CAPSULE | Freq: Two times a day (BID) | ORAL | Status: DC
  Administered 2017-01-05 – 2017-01-06 (×2): 1 via ORAL
  Filled 2017-01-05 (×2): qty 1

## 2017-01-05 MED ORDER — PANTOPRAZOLE SODIUM 40 MG PO TBEC
40.0000 mg | DELAYED_RELEASE_TABLET | Freq: Every day | ORAL | Status: DC
  Administered 2017-01-06: 40 mg via ORAL
  Filled 2017-01-05: qty 1

## 2017-01-05 MED ORDER — ONDANSETRON HCL 4 MG/2ML IJ SOLN
INTRAMUSCULAR | Status: AC
  Filled 2017-01-05: qty 2

## 2017-01-05 MED ORDER — ALUM & MAG HYDROXIDE-SIMETH 200-200-20 MG/5ML PO SUSP: 15.0000 mL | ORAL | Status: DC | PRN

## 2017-01-05 MED ORDER — CEFAZOLIN SODIUM 1 G IJ SOLR
INTRAMUSCULAR | Status: DC | PRN
  Administered 2017-01-05: 2 g via INTRAMUSCULAR

## 2017-01-05 MED ORDER — METOPROLOL TARTRATE 5 MG/5ML IV SOLN: 2.0000 mg | INTRAVENOUS | Status: DC | PRN

## 2017-01-05 MED ORDER — ROCURONIUM BROMIDE 100 MG/10ML IV SOLN
INTRAVENOUS | Status: DC | PRN
  Administered 2017-01-05: 20 mg via INTRAVENOUS
  Administered 2017-01-05: 40 mg via INTRAVENOUS

## 2017-01-05 MED ORDER — PHENYLEPHRINE HCL 10 MG/ML IJ SOLN
INTRAMUSCULAR | Status: DC | PRN
  Administered 2017-01-05 (×2): 100 ug via INTRAVENOUS

## 2017-01-05 MED ORDER — FENTANYL CITRATE (PF) 100 MCG/2ML IJ SOLN
25.0000 ug | INTRAMUSCULAR | Status: DC | PRN
  Administered 2017-01-05: 25 ug via INTRAVENOUS

## 2017-01-05 MED ORDER — FENTANYL CITRATE (PF) 100 MCG/2ML IJ SOLN
INTRAMUSCULAR | Status: AC
  Filled 2017-01-05: qty 2

## 2017-01-05 MED ORDER — OMEGA-3-ACID ETHYL ESTERS 1 G PO CAPS
2.0000 g | ORAL_CAPSULE | Freq: Every day | ORAL | Status: DC
  Administered 2017-01-05 – 2017-01-06 (×2): 2 g via ORAL
  Filled 2017-01-05 (×2): qty 2

## 2017-01-05 MED ORDER — HYDRALAZINE HCL 20 MG/ML IJ SOLN: 5.0000 mg | INTRAMUSCULAR | Status: DC | PRN

## 2017-01-05 MED ORDER — PROPOFOL 10 MG/ML IV BOLUS
INTRAVENOUS | Status: AC
  Filled 2017-01-05: qty 20

## 2017-01-05 MED ORDER — ACETAMINOPHEN 325 MG PO TABS: 325.0000 mg | ORAL_TABLET | ORAL | Status: DC | PRN

## 2017-01-05 MED ORDER — EPHEDRINE SULFATE 50 MG/ML IJ SOLN
INTRAMUSCULAR | Status: DC | PRN
  Administered 2017-01-05: 10 mg via INTRAVENOUS

## 2017-01-05 MED ORDER — FERROUS SULFATE 325 (65 FE) MG PO TABS
325.0000 mg | ORAL_TABLET | Freq: Two times a day (BID) | ORAL | Status: DC
  Administered 2017-01-06: 325 mg via ORAL
  Filled 2017-01-05: qty 1

## 2017-01-05 MED ORDER — SODIUM CHLORIDE 0.9 % IV SOLN
INTRAVENOUS | Status: DC | PRN
  Administered 2017-01-05: 20 ug/min via INTRAVENOUS

## 2017-01-05 MED ORDER — EPHEDRINE SULFATE 50 MG/ML IJ SOLN
INTRAMUSCULAR | Status: AC
  Filled 2017-01-05: qty 1

## 2017-01-05 MED ORDER — PROPOFOL 10 MG/ML IV BOLUS
INTRAVENOUS | Status: DC | PRN
  Administered 2017-01-05: 150 mg via INTRAVENOUS

## 2017-01-05 MED ORDER — CLOPIDOGREL BISULFATE 75 MG PO TABS
75.0000 mg | ORAL_TABLET | Freq: Every day | ORAL | Status: DC
Start: 2017-01-05 — End: 2017-01-06
  Administered 2017-01-05 – 2017-01-06 (×2): 75 mg via ORAL
  Filled 2017-01-05 (×2): qty 1

## 2017-01-05 MED ORDER — LIDOCAINE HCL (CARDIAC) 20 MG/ML IV SOLN
INTRAVENOUS | Status: DC | PRN
  Administered 2017-01-05: 80 mg via INTRAVENOUS

## 2017-01-05 MED ORDER — DOCUSATE SODIUM 100 MG PO CAPS
100.0000 mg | ORAL_CAPSULE | Freq: Every day | ORAL | Status: DC
  Administered 2017-01-05 – 2017-01-06 (×2): 100 mg via ORAL
  Filled 2017-01-05 (×2): qty 1

## 2017-01-05 MED ORDER — FENTANYL CITRATE (PF) 100 MCG/2ML IJ SOLN
INTRAMUSCULAR | Status: DC | PRN
  Administered 2017-01-05: 50 ug via INTRAVENOUS
  Administered 2017-01-05: 100 ug via INTRAVENOUS

## 2017-01-05 MED ORDER — LISINOPRIL 5 MG PO TABS
5.0000 mg | ORAL_TABLET | Freq: Every day | ORAL | Status: DC
Start: 2017-01-06 — End: 2017-01-06
  Administered 2017-01-06: 5 mg via ORAL
  Filled 2017-01-05: qty 1

## 2017-01-05 MED ORDER — NITROGLYCERIN IN D5W 200-5 MCG/ML-% IV SOLN: 5.0000 ug/min | INTRAVENOUS | Status: DC

## 2017-01-05 MED ORDER — ONDANSETRON HCL 4 MG/2ML IJ SOLN: 4.0000 mg | Freq: Once | INTRAMUSCULAR | Status: DC | PRN

## 2017-01-05 MED ORDER — LIDOCAINE HCL (PF) 2 % IJ SOLN
INTRAMUSCULAR | Status: AC
Start: 2017-01-05 — End: 2017-01-05
  Filled 2017-01-05: qty 2

## 2017-01-05 MED ORDER — MORPHINE SULFATE (PF) 4 MG/ML IV SOLN
2.0000 mg | INTRAVENOUS | Status: DC | PRN
  Administered 2017-01-05: 4 mg via INTRAVENOUS
  Filled 2017-01-05: qty 1

## 2017-01-05 MED ORDER — MIDAZOLAM HCL 2 MG/2ML IJ SOLN
INTRAMUSCULAR | Status: DC | PRN
  Administered 2017-01-05: 1 mg via INTRAVENOUS

## 2017-01-05 MED ORDER — CEFAZOLIN SODIUM-DEXTROSE 2-3 GM-% IV SOLR
2.0000 g | Freq: Once | INTRAVENOUS | Status: DC
  Filled 2017-01-05: qty 50

## 2017-01-05 MED ORDER — ONDANSETRON HCL 4 MG/2ML IJ SOLN: 4.0000 mg | Freq: Four times a day (QID) | INTRAMUSCULAR | Status: DC | PRN

## 2017-01-05 MED ORDER — DEXTROSE 5 % IV SOLN
1.5000 g | Freq: Two times a day (BID) | INTRAVENOUS | Status: AC
  Administered 2017-01-05 – 2017-01-06 (×2): 1.5 g via INTRAVENOUS
  Filled 2017-01-05 (×2): qty 1.5

## 2017-01-05 MED ORDER — CARISOPRODOL 350 MG PO TABS
350.0000 mg | ORAL_TABLET | Freq: Four times a day (QID) | ORAL | Status: DC
  Administered 2017-01-05 – 2017-01-06 (×4): 350 mg via ORAL
  Filled 2017-01-05 (×4): qty 1

## 2017-01-05 MED ORDER — TRAMADOL-ACETAMINOPHEN 37.5-325 MG PO TABS: 1.0000 | ORAL_TABLET | Freq: Four times a day (QID) | ORAL | Status: DC | PRN

## 2017-01-05 MED ORDER — VITAMIN B-12 1000 MCG PO TABS
1000.0000 ug | ORAL_TABLET | Freq: Every day | ORAL | Status: DC
  Administered 2017-01-05 – 2017-01-06 (×2): 1000 ug via ORAL
  Filled 2017-01-05 (×2): qty 1

## 2017-01-05 MED ORDER — SUGAMMADEX SODIUM 200 MG/2ML IV SOLN
INTRAVENOUS | Status: AC
  Filled 2017-01-05: qty 2

## 2017-01-05 MED ORDER — ASPIRIN EC 81 MG PO TBEC
81.0000 mg | DELAYED_RELEASE_TABLET | Freq: Every day | ORAL | Status: DC
  Administered 2017-01-05 – 2017-01-06 (×2): 81 mg via ORAL
  Filled 2017-01-05 (×2): qty 1

## 2017-01-05 MED ORDER — PHENOL 1.4 % MT LIQD: 1.0000 | OROMUCOSAL | Status: DC | PRN

## 2017-01-05 MED ORDER — POTASSIUM CHLORIDE CRYS ER 20 MEQ PO TBCR: 20.0000 meq | EXTENDED_RELEASE_TABLET | Freq: Every day | ORAL | Status: DC | PRN

## 2017-01-05 SURGICAL SUPPLY — 49 items
BLADE SURG 15 STRL LF DISP TIS (BLADE) ×1 IMPLANT
BLADE SURG 15 STRL SS (BLADE) ×1
BLADE SURG SZ11 CARB STEEL (BLADE) ×2 IMPLANT
BRUSH SCRUB 4% CHG (MISCELLANEOUS) ×2 IMPLANT
CATH ACCU-VU SIZ PIG 5F 70CM (CATHETERS) ×2 IMPLANT
CATH BALLN CODA 9X100X32 (BALLOONS) ×2 IMPLANT
CATH BEACON 5.038 65CM KMP-01 (CATHETERS) ×2 IMPLANT
DERMABOND ADVANCED (GAUZE/BANDAGES/DRESSINGS) ×1
DERMABOND ADVANCED .7 DNX12 (GAUZE/BANDAGES/DRESSINGS) ×1 IMPLANT
DEVICE CLOSURE PERCLS PRGLD 6F (VASCULAR PRODUCTS) ×6 IMPLANT
DEVICE PRESTO INFLATION (MISCELLANEOUS) ×2 IMPLANT
DEVICE SAFEGUARD 24CM (GAUZE/BANDAGES/DRESSINGS) ×2 IMPLANT
DEVICE TORQUE (MISCELLANEOUS) ×2 IMPLANT
DRYSEAL FLEXSHEATH 12FR 33CM (SHEATH) ×1
DRYSEAL FLEXSHEATH 16FR 33CM (SHEATH) ×1
ELECT CAUTERY BLADE 6.4 (BLADE) ×2 IMPLANT
ELECT REM PT RETURN 9FT ADLT (ELECTROSURGICAL) ×2
ELECTRODE REM PT RTRN 9FT ADLT (ELECTROSURGICAL) ×1 IMPLANT
EXCLUDER TNK LEG 26MX12X18 (Endovascular Graft) ×1 IMPLANT
EXCLUDER TRUNK LEG 26MX12X18 (Endovascular Graft) ×2 IMPLANT
GLIDEWIRE STIFF .35X180X3 HYDR (WIRE) ×2 IMPLANT
GLOVE BIO SURGEON STRL SZ7 (GLOVE) ×4 IMPLANT
GLOVE BIO SURGEON STRL SZ8 (GLOVE) ×2 IMPLANT
GOWN STRL REUS W/ TWL LRG LVL3 (GOWN DISPOSABLE) ×2 IMPLANT
GOWN STRL REUS W/ TWL XL LVL3 (GOWN DISPOSABLE) ×2 IMPLANT
GOWN STRL REUS W/TWL LRG LVL3 (GOWN DISPOSABLE) ×2
GOWN STRL REUS W/TWL XL LVL3 (GOWN DISPOSABLE) ×2
GRAFT EXCLUDER LEG 12X10 (Endovascular Graft) ×2 IMPLANT
LEG CONTRALATERAL 16X16X13.5 (Endovascular Graft) ×1 IMPLANT
NEEDLE ENTRY 21GA 7CM ECHOTIP (NEEDLE) ×2 IMPLANT
PACK ANGIOGRAPHY (CUSTOM PROCEDURE TRAY) ×2 IMPLANT
PACK BASIN MAJOR ARMC (MISCELLANEOUS) ×2 IMPLANT
PERCLOSE PROGLIDE 6F (VASCULAR PRODUCTS) ×12
SET INTRO CAPELLA COAXIAL (SET/KITS/TRAYS/PACK) ×2 IMPLANT
SHEATH 9FRX11 (SHEATH) ×4 IMPLANT
SHEATH BRITE TIP 6FRX11 (SHEATH) ×4 IMPLANT
SHEATH DRYSEAL FLEX 12FR 33CM (SHEATH) ×1 IMPLANT
SHEATH DRYSEAL FLEX 16FR 33CM (SHEATH) ×1 IMPLANT
SPONGE XRAY 4X4 16PLY STRL (MISCELLANEOUS) ×8 IMPLANT
STENT GRAFT CONTRALAT 16X13.5 (Endovascular Graft) ×1 IMPLANT
SUT MNCRL 4-0 (SUTURE) ×1
SUT MNCRL 4-0 27XMFL (SUTURE) ×1
SUT PROLENE 6 0 BV (SUTURE) ×2 IMPLANT
SUTURE MNCRL 4-0 27XMF (SUTURE) ×1 IMPLANT
SYR 20CC LL (SYRINGE) ×2 IMPLANT
TOWEL OR 17X26 4PK STRL BLUE (TOWEL DISPOSABLE) ×2 IMPLANT
TUBING CONTRAST HIGH PRESS 72 (TUBING) ×2 IMPLANT
WIRE AMPLATZ SSTIFF .035X260CM (WIRE) ×4 IMPLANT
WIRE J 3MM .035X145CM (WIRE) ×4 IMPLANT

## 2017-01-05 NOTE — Op Note (Addendum)
OPERATIVE NOTE   PROCEDURE: 1. US guidance for vascular access, bilateral femoral arteries 2. Catheter placement into aorta from bilateral femoral approaches 3. Placement of a 26 mm x 18 cm length Gore Excluder Endoprosthesis main body right with a 16 mm diameter by 14 cm length left contralateral limb 4. Placement of a 12 mm diameter by 10 cm length right iliac extension limb into the right external iliac artery 5. ProGlide closure devices bilateral femoral arteries  PRE-OPERATIVE DIAGNOSIS: AAA  POST-OPERATIVE DIAGNOSIS: same  SURGEON: [name], MD   ANESTHESIA: Gen.  ASSISTANT: Jamesetta So, MD  ESTIMATED BLOOD LOSS: 100 cc  FINDING(S): 1.  AAA  SPECIMEN(S):  none  INDICATIONS:   Rick Mcbride. is a 76 y.o. male who presents with a 5 cm infrarenal abdominal aortic aneurysm. The anatomy was suitable for endovascular repair.  Risks and benefits of repair in an endovascular fashion were discussed and informed consent was obtained.  DESCRIPTION: After obtaining full informed written consent, the patient was brought back to the operating room and placed supine upon the operating table.  The patient received IV antibiotics prior to induction.  After obtaining adequate anesthesia, the patient was prepped and draped in the standard fashion for endovascular AAA repair.  We then began by gaining access to both femoral arteries with US guidance.  The femoral arteries were found to be patent and accessed without difficulty with a needle under ultrasound guidance without difficulty on each side and permanent images were recorded.  We then placed 2 proglide devices on each side in a pre-close fashion and placed 8 French sheaths. The patient was then given 5000 units of intravenous heparin. The Pigtail catheter was placed into the aorta from the right side. Using this image, we selected a 26 mm diameter 18 cm length Main body device on the right.  Over a stiff wire, an 16 French  sheath was placed. The main body was then placed through the 16 French sheath on the right. A Kumpe catheter was placed up the left side and a magnified image at the renal arteries was performed. The main body was then deployed just below the lowest renal artery. The Kumpe catheter was used to cannulate the contralateral gate without difficulty and successful cannulation was confirmed by twirling the pigtail catheter in the main body. We then placed a stiff wire and a retrograde arteriogram was performed through the left femoral sheath. We upsized to the 12 [place] sheath on the left for the contralateral limb and a 16 mm diameter by 14 cm length left iliac limb was selected and deployed. The main body deployment was then completed. Based off the angiographic findings, extension limbs were necessary.  He is aneurysmal disease extended to the iliac bifurcation on the right and we had already performed coil embolization right hypogastric artery. To cover his aneurysmal disease extension to the right external iliac artery was required. A 12 mm diameter by 10 cm length right iliac extension limb was deployed to the mid right external iliac artery. All junction points and seals zones were treated with the compliant balloon. The pigtail catheter was then replaced and a completion angiogram was performed.  No Endoleak was detected on completion angiography. The renal arteries were found to be widely patent. The left hypogastric artery was found to be widely patent. At this point we elected to terminate the procedure. We secured the pro glide devices for hemostasis on the femoral arteries. The skin incision was closed  with a 4-0 Monocryl. Dermabond and pressure dressing were placed. The patient was taken to the recovery room in stable condition having tolerated the procedure well.  COMPLICATIONS: none  CONDITION: stable  [name]  01/05/2017, 10:05 AM   This note was created with Dragon Medical transcription  system. Any errors in dictation are purely unintentional.

## 2017-01-05 NOTE — Anesthesia Procedure Notes (Signed)
Procedure Name: Intubation Date/Time: 01/05/2017 8:00 AM Performed by: Hedda Slade Pre-anesthesia Checklist: Patient identified, Patient being monitored, Timeout performed, Emergency Drugs available and Suction available Patient Re-evaluated:Patient Re-evaluated prior to inductionOxygen Delivery Method: Circle system utilized Preoxygenation: Pre-oxygenation with 100% oxygen Intubation Type: IV induction Ventilation: Mask ventilation without difficulty Laryngoscope Size: Mac and 4 Grade View: Grade I Tube type: Oral Tube size: 7.5 mm Number of attempts: 1 Airway Equipment and Method: Stylet Placement Confirmation: ETT inserted through vocal cords under direct vision,  positive ETCO2 and breath sounds checked- equal and bilateral Secured at: 22 cm Tube secured with: Tape Dental Injury: Teeth and Oropharynx as per pre-operative assessment

## 2017-01-05 NOTE — Transfer of Care (Signed)
Immediate Anesthesia Transfer of Care Note  Patient: Rick Mcbride.  Procedure(s) Performed: Procedure(s): Endovascular Repair/Stent Graft (N/A)  Patient Location: PACU  Anesthesia Type:General  Level of Consciousness: awake  Airway & Oxygen Therapy: Patient Spontanous Breathing and Patient connected to face mask oxygen  Post-op Assessment: Report given to RN and Post -op Vital signs reviewed and stable  Post vital signs: Reviewed and stable  Last Vitals:  Vitals:   01/05/17 0637  BP: (!) 143/81  Pulse: 80  Resp: 14  Temp: 36.9 C    Last Pain:  Vitals:   01/05/17 0637  PainSc: 7          Complications: No apparent anesthesia complications

## 2017-01-05 NOTE — Anesthesia Preprocedure Evaluation (Signed)
Anesthesia Evaluation  Patient identified by MRN, date of birth, ID band Patient awake    Reviewed: Allergy & Precautions, H&P , NPO status , Patient's Chart, lab work & pertinent test results, reviewed documented beta blocker date and time   History of Anesthesia Complications Negative for: history of anesthetic complications  Airway Mallampati: II  TM Distance: >3 FB Neck ROM: full    Dental  (+) Edentulous Upper, Edentulous Lower, Upper Dentures, Lower Dentures   Pulmonary neg shortness of breath, neg sleep apnea, COPD, neg recent URI, former smoker,           Cardiovascular Exercise Tolerance: Good hypertension, On Medications (-) angina+ CAD, + CABG and + Peripheral Vascular Disease  (-) Past MI and (-) Cardiac Stents + dysrhythmias + pacemaker (-) Valvular Problems/Murmurs     Neuro/Psych neg Seizures TIA Neuromuscular disease CVA, No Residual Symptoms negative psych ROS   GI/Hepatic Neg liver ROS, GERD  ,  Endo/Other  negative endocrine ROS  Renal/GU CRFRenal disease  negative genitourinary   Musculoskeletal   Abdominal   Peds  Hematology  (+) anemia ,   Anesthesia Other Findings Past Medical History: No date: AAA (abdominal aortic aneurysm) (HCC) No date: AAA (abdominal aortic aneurysm) without ruptur* No date: Anemia No date: Aneurysm ([place])     Comment: abd aortic No date: Anxiety No date: Atrophic kidney No date: Cervical radiculopathy No date: Chronic airway obstruction (HCC) No date: Chronic kidney disease (CKD), stage III ([place]er*     Comment: followed by Dr. Johnny Bridge No date: Chronic tension headaches No date: Coronary artery disease No date: Coronary atherosclerosis of autologous vein by* No date: DDD (degenerative disc disease), lumbar No date: Degenerative disc disease, lumbar     Comment: with lumbar radiculopathy No date: Elbow fracture, left No date: GERD (gastroesophageal reflux  disease) No date: H/O adenomatous polyp of colon No date: H/O hemorrhoids No date: H/O urticaria No date: Headache No date: Heart disease No date: Hypercholesteremia No date: Hyperlipidemia No date: Iliac aneurysm (HCC) No date: Iliac aneurysm (HCC) No date: Iliac aneurysm ([place])     Comment: followed by Dr. 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 Nehemiah Massed No date: Sinoatrial node dysfunction ([place]) No date: Stroke ([place]) No date: TIA (transient ischemic attack)   Reproductive/Obstetrics negative OB ROS                             Anesthesia Physical Anesthesia Plan  ASA: III  Anesthesia Plan: General   Post-op Pain Management:    Induction:   Airway Management Planned:   Additional Equipment:   Intra-op Plan:   Post-operative Plan:   Informed Consent: I have reviewed the patients History and Physical, chart, labs and discussed the procedure including the risks, benefits and alternatives for the proposed anesthesia with the patient or authorized representative who has indicated his/her understanding and acceptance.   Dental Advisory Given  Plan Discussed with: Anesthesiologist, CRNA and Surgeon  Anesthesia Plan Comments:         Anesthesia Quick Evaluation

## 2017-01-05 NOTE — Progress Notes (Signed)
Paged Dolores Hoose regarding patient heart rate in the 40's and what appears to be 2nd degree heart block at times. He is not concerned since patient is not dizzy and blood pressure wnl. No additional orders given.

## 2017-01-05 NOTE — H&P (Signed)
[facility] History & Physical Update  The patient was interviewed and re-examined.  The patient's previous History and Physical has been reviewed and is unchanged.  There is no change in the plan of care. We plan to proceed with the scheduled procedure.  [name], MD  01/05/2017, 7:37 AM

## 2017-01-05 NOTE — Anesthesia Post-op Follow-up Note (Cosign Needed)
Anesthesia QCDR form completed.        

## 2017-01-05 NOTE — Progress Notes (Signed)
Paged Dr Lucky Cowboy regarding no orders on how to manage PAD's. States that they will be removed tomorrow and to assess site every q2. He will place orders.

## 2017-01-06 ENCOUNTER — Encounter: Payer: Self-pay | Admitting: Vascular Surgery

## 2017-01-06 LAB — PREPARE RBC (CROSSMATCH)

## 2017-01-06 LAB — BASIC METABOLIC PANEL
Anion gap: 8 (ref 5–15)
BUN: 19 mg/dL (ref 6–20)
CHLORIDE: 106 mmol/L (ref 101–111)
CO2: 26 mmol/L (ref 22–32)
CREATININE: 2.1 mg/dL — AB (ref 0.61–1.24)
Calcium: 8.6 mg/dL — ABNORMAL LOW (ref 8.9–10.3)
GFR calc non Af Amer: 29 mL/min — ABNORMAL LOW (ref >60)
GFR, EST AFRICAN AMERICAN: 34 mL/min — AB (ref >60)
GLUCOSE: 101 mg/dL — AB (ref 65–99)
Potassium: 4 mmol/L (ref 3.5–5.1)
Sodium: 140 mmol/L (ref 135–145)

## 2017-01-06 LAB — CBC
HEMATOCRIT: 34.8 % — AB (ref 40.0–52.0)
HEMOGLOBIN: 12.1 g/dL — AB (ref 13.0–18.0)
MCH: 30.4 pg (ref 26.0–34.0)
MCHC: 34.6 g/dL (ref 32.0–36.0)
MCV: 87.7 fL (ref 80.0–100.0)
Platelets: 121 10*3/uL — ABNORMAL LOW (ref 150–440)
RBC: 3.97 MIL/uL — ABNORMAL LOW (ref 4.40–5.90)
RDW: 14.5 % (ref 11.5–14.5)
WBC: 6.5 10*3/uL (ref 3.8–10.6)

## 2017-01-06 MED ORDER — OXYCODONE-ACETAMINOPHEN 5-325 MG PO TABS: 1.0000 | ORAL_TABLET | ORAL | 0 refills | Status: DC | PRN

## 2017-01-06 MED ORDER — FAMOTIDINE 20 MG PO TABS: 20.0000 mg | ORAL_TABLET | Freq: Two times a day (BID) | ORAL | Status: DC

## 2017-01-06 NOTE — Anesthesia Postprocedure Evaluation (Signed)
Anesthesia Post Note  Patient: Rick Mcbride.  Procedure(s) Performed: Procedure(s) (LRB): Endovascular Repair/Stent Graft (N/A)  Patient location during evaluation: ICU Anesthesia Type: General Level of consciousness: awake and alert Pain management: pain level controlled Vital Signs Assessment: post-procedure vital signs reviewed and stable Respiratory status: spontaneous breathing, nonlabored ventilation, respiratory function stable and patient connected to nasal cannula oxygen Cardiovascular status: blood pressure returned to baseline and stable Postop Assessment: no signs of nausea or vomiting Anesthetic complications: no     Last Vitals:  Vitals:   01/06/17 0500 01/06/17 0600  BP: 115/70 127/67  Pulse: 83 74  Resp: 15 11  Temp:      Last Pain:  Vitals:   01/06/17 0300  TempSrc: Oral  PainSc: 3                  Alison Stalling

## 2017-01-06 NOTE — Progress Notes (Signed)
CONCERNING: IV to Oral Route Change Policy  RECOMMENDATION: This patient is receiving famotidine by the intravenous route.  Based on criteria approved by the Pharmacy and Therapeutics Committee, the intravenous medication(s) is/are being converted to the equivalent oral dose form(s).   DESCRIPTION: These criteria include:  The patient is eating (either orally or via tube) and/or has been taking other orally administered medications for a least 24 hours  The patient has no evidence of active gastrointestinal bleeding or impaired GI absorption (gastrectomy, short bowel, patient on TNA or NPO).  If you have questions about this conversion, please contact the Pharmacy Department  '[]'$   [phone] )  [facility] '[x]'$   [phone] )  [facility] '[]'$   [phone] )  [facility] '[]'$   [phone] )  [facility] '[]'$   [phone] )  [place], [facility] 01/06/2017 8:52 AM

## 2017-01-06 NOTE — Progress Notes (Addendum)
Patient sitting up in chair. Dr. Lucky Cowboy aware that BP was in 140's and after medication given systolic in the 84'X. Patient asymptomatic. Right and left groin remain 1 with dressings removed by Dr. Lucky Cowboy. Patient assisted with getting him dressed. Patient does have urine leakage from his penis, this is normal for him per pt and wife. Discharge instructions given to patient and wife. All questions answered. No further needs. D/C via w/c with NT at side. Information verbalized back.  Wife aware of BP and what signs and symptoms require medical attention. D/C at 1155.  Wife states patient had on nice black coat when he arrived at cath lab.  Wife and Richarda Osmond Control and instrumentation engineer) check with cath lab and NT, RN, and wife double checked room with no coat found. Wife given meal card and number to patient relations.

## 2017-01-06 NOTE — Progress Notes (Signed)
[facility]  Daily Progress Note   Subjective  - 1 Day Post-Op  Doing well.  Had back pain which is chronic.  No major events overnight.  Good UOP.  Cr bumped slightly.    Objective Vitals:   01/06/17 0600 01/06/17 0700 01/06/17 0800 01/06/17 0841  BP: 127/67 128/74 (!) 149/72 (!) 149/72  Pulse: 74 73 75   Resp: '11 14 14   '$ Temp:      TempSrc:      SpO2: 99% 97% 100%   Weight:      Height:        Intake/Output Summary (Last 24 hours) at 01/06/17 1020 Last data filed at 01/06/17 1018  Gross per 24 hour  Intake              700 ml  Output             1400 ml  Net             -700 ml    PULM  CTAB CV  RRR VASC  Access sites C/D/I. Feet warm.  Laboratory CBC    Component Value Date/Time   WBC 6.5 01/06/2017 0550   HGB 12.1 (L) 01/06/2017 0550   HGB 12.8 (L) 10/25/2014 1101   HCT 34.8 (L) 01/06/2017 0550   HCT 38.8 (L) 10/25/2014 1101   PLT 121 (L) 01/06/2017 0550   PLT 153 10/25/2014 1101    BMET    Component Value Date/Time   NA 140 01/06/2017 0550   NA 142 10/25/2014 1101   K 4.0 01/06/2017 0550   K 4.1 10/25/2014 1101   CL 106 01/06/2017 0550   CL 108 (H) 10/25/2014 1101   CO2 26 01/06/2017 0550   CO2 28 10/25/2014 1101   GLUCOSE 101 (H) 01/06/2017 0550   GLUCOSE 108 (H) 10/25/2014 1101   BUN 19 01/06/2017 0550   BUN 20 (H) 10/25/2014 1101   CREATININE 2.10 (H) 01/06/2017 0550   CREATININE 1.64 (H) 10/25/2014 1101   CALCIUM 8.6 (L) 01/06/2017 0550   CALCIUM 8.6 10/25/2014 1101   GFRNONAA 29 (L) 01/06/2017 0550   GFRNONAA 44 (L) 10/25/2014 1101   GFRNONAA 40 (L) 03/27/2014 1052   GFRAA 34 (L) 01/06/2017 0550   GFRAA 53 (L) 10/25/2014 1101   GFRAA 47 (L) 03/27/2014 1052    Assessment/Planning: POD #1 s/p EVAR   Doing well  Plan discharge today.  RTC in 2-3 weeks.    [name]  01/06/2017, 10:20 AM

## 2017-01-06 NOTE — Discharge Summary (Signed)
[facility]    Discharge Summary    Patient ID:  Rick Mcbride. MRN: [id number] DOB/AGE: 07/11/1941 76 y.o.  Admit date: 01/05/2017 Discharge date: 01/06/2017 Date of Surgery: 01/05/2017 Surgeon: Surgeon(s): Algernon Huxley, MD  Admission Diagnosis: AAA (abdominal aortic aneurysm) without rupture [facility]) [I71.4]  Discharge Diagnoses:  AAA (abdominal aortic aneurysm) without rupture [facility]) [I71.4]  Secondary Diagnoses: Past Medical History:  Diagnosis Date  . AAA (abdominal aortic aneurysm) ([place])   . AAA (abdominal aortic aneurysm) without rupture ([place])   . Anemia   . Aneurysm ([place])    abd aortic  . Anxiety   . Atrophic kidney   . Cervical radiculopathy   . Chronic airway obstruction ([place])   . Chronic kidney disease (CKD), stage III (moderate)    followed by Dr. Johnny Bridge  . Chronic tension headaches   . Coronary artery disease   . Coronary atherosclerosis of autologous vein bypass graft   . DDD (degenerative disc disease), lumbar   . Degenerative disc disease, lumbar    with lumbar radiculopathy  . Elbow fracture, left   . GERD (gastroesophageal reflux disease)   . H/O adenomatous polyp of colon   . H/O hemorrhoids   . H/O urticaria   . Headache   . Heart disease   . Hypercholesteremia   . Hyperlipidemia   . Iliac aneurysm ([place])   . Iliac aneurysm ([place])   . Iliac aneurysm ([place])    followed by Dr. Lucky Cowboy  . Lung cancer ([place])   . Lung cancer ([place])   . Meralgia paresthetica   . Meralgia paresthetica   . Neuralgia   . Osteoarthritis   . Osteoarthritis    s/p L knee surgery  . 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 Presence of permanent cardiac pacemaker   . Prostate cancer ([place])   . Second degree AV block    Followed by Dr. Nehemiah Massed  . Sinoatrial node dysfunction (HCC)   . Stroke ([place])   . TIA (transient ischemic attack)     Procedure(s): Endovascular Repair/Stent Graft  Discharged Condition: good  HPI:   Patient with >5 cm AAA.  Here for elective repair  Hospital Course:  Rick Mcbride. is a 76 y.o. male is S/P Neither Procedure(s): Endovascular Repair/Stent Graft Extubated: POD # 0 Physical exam: feet warm, groins C/D/I Post-op wounds clean, dry, intact or healing well Pt. Ambulating, voiding and taking PO diet without difficulty. Pt pain controlled with PO pain meds. Labs as below Complications:none  Consults:    Significant Diagnostic Studies: CBC Lab Results  Component Value Date   WBC 6.5 01/06/2017   HGB 12.1 (L) 01/06/2017   HCT 34.8 (L) 01/06/2017   MCV 87.7 01/06/2017   PLT 121 (L) 01/06/2017    BMET    Component Value Date/Time   NA 140 01/06/2017 0550   NA 142 10/25/2014 1101   K 4.0 01/06/2017 0550   K 4.1 10/25/2014 1101   CL 106 01/06/2017 0550   CL 108 (H) 10/25/2014 1101   CO2 26 01/06/2017 0550   CO2 28 10/25/2014 1101   GLUCOSE 101 (H) 01/06/2017 0550   GLUCOSE 108 (H) 10/25/2014 1101   BUN 19 01/06/2017 0550   BUN 20 (H) 10/25/2014 1101   CREATININE 2.10 (H) 01/06/2017 0550   CREATININE 1.64 (H) 10/25/2014 1101   CALCIUM 8.6 (L) 01/06/2017 0550   CALCIUM 8.6 10/25/2014 1101   GFRNONAA 29 (L)  01/06/2017 0550   GFRNONAA 44 (L) 10/25/2014 1101   GFRNONAA 40 (L) 03/27/2014 1052   GFRAA 34 (L) 01/06/2017 0550   GFRAA 53 (L) 10/25/2014 1101   GFRAA 47 (L) 03/27/2014 1052   COAG Lab Results  Component Value Date   INR 1.01 12/23/2016   INR 1.00 07/03/2015   INR 1.0 10/25/2014     Disposition:  Discharge to :Home Discharge Instructions    Call MD for:  redness, tenderness, or signs of infection (pain, swelling, bleeding, redness, odor or green/yellow discharge around incision site)    Complete by:  As directed    Call MD for:  severe or increased pain, loss or decreased feeling  in affected limb(s)    Complete by:  As directed    Call MD for:  temperature >100.5    Complete by:  As directed    Driving Restrictions    Complete  by:  As directed    No driving for 48 hours   Lifting restrictions    Complete by:  As directed    No lifting for 48 hours   No dressing needed    Complete by:  As directed    Replace only if drainage present   Resume previous diet    Complete by:  As directed      Allergies as of 01/06/2017   No Known Allergies     Medication List    TAKE these medications   aspirin EC 81 MG tablet Take 81 mg by mouth daily.   atorvastatin 40 MG tablet Commonly known as:  LIPITOR Take 40 mg by mouth daily at 6 PM.   CALCIUM 600 + D PO Take 1 tablet by mouth daily.   carisoprodol 350 MG tablet Commonly known as:  SOMA Take 350 mg by mouth 4 (four) times daily as needed for muscle spasms.   CENTRUM SILVER ULTRA MENS Tabs Take 1 tablet by mouth daily.   PRESERVISION/LUTEIN Caps Take 1 capsule by mouth 2 (two) times daily.   cetirizine 10 MG tablet Commonly known as:  ZYRTEC Take 10 mg by mouth daily.   clopidogrel 75 MG tablet Commonly known as:  PLAVIX Take 75 mg by mouth daily.   clotrimazole-betamethasone cream Commonly known as:  LOTRISONE Apply 1 application topically 2 (two) times daily.   cyclobenzaprine 10 MG tablet Commonly known as:  FLEXERIL Take 10 mg by mouth 3 (three) times daily as needed for muscle spasms.   docusate sodium 100 MG capsule Commonly known as:  COLACE Take 100 mg by mouth daily.   donepezil 5 MG tablet Commonly known as:  ARICEPT Take 5 mg by mouth at bedtime.   ferrous sulfate 160 (50 Fe) MG Tbcr SR tablet Commonly known as:  SLOW FE Take 1 tablet by mouth 2 (two) times daily.   Fish Oil 1200 MG Caps Take 2 capsules by mouth 2 (two) times daily.   lisinopril 5 MG tablet Commonly known as:  PRINIVIL,ZESTRIL Take 5 mg by mouth daily.   niacin 500 MG CR tablet Commonly known as:  NIASPAN Take 500 mg by mouth daily.   omeprazole 20 MG capsule Commonly known as:  PRILOSEC Take 20 mg by mouth daily.   oxyCODONE-acetaminophen 5-325  MG tablet Commonly known as:  PERCOCET/ROXICET Take 1-2 tablets by mouth every 4 (four) hours as needed for moderate pain.   traMADol-acetaminophen 37.5-325 MG tablet Commonly known as:  ULTRACET 1 tablet every 6 (six) hours as needed for moderate  pain or severe pain.   vitamin B-12 1000 MCG tablet Commonly known as:  CYANOCOBALAMIN Take 1,000 mcg by mouth daily.   zolpidem 10 MG tablet Commonly known as:  AMBIEN Take 10 mg by mouth at bedtime.      Verbal and written Discharge instructions given to the patient. Wound care per Discharge AVS Follow-up Information    [name], MD Follow up in 3 week(s).   Specialties:  Vascular Surgery, Radiology, Interventional Cardiology Why:  with [facility] Contact information: [address] [place] [address] [phone]           Signed: [name], MD  01/06/2017, 10:30 AM

## 2017-01-07 LAB — TYPE AND SCREEN
ABO/RH(D): O POS
ANTIBODY SCREEN: NEGATIVE
UNIT DIVISION: 0
Unit division: 0

## 2017-01-07 LAB — BPAM RBC
BLOOD PRODUCT EXPIRATION DATE: 201803272359
Blood Product Expiration Date: 201803272359
Unit Type and Rh: 5100
Unit Type and Rh: 5100

## 2017-01-26 ENCOUNTER — Observation Stay
Admission: EM | Admit: 2017-01-26 | Discharge: 2017-01-28 | Disposition: A | Discharge status: Home / Self Care | Payer: PPO | Attending: Internal Medicine | Admitting: Internal Medicine

## 2017-01-26 ENCOUNTER — Emergency Department: Payer: PPO

## 2017-01-26 DIAGNOSIS — K219 Gastro-esophageal reflux disease without esophagitis: Secondary | ICD-10-CM | POA: Diagnosis not present

## 2017-01-26 DIAGNOSIS — N390 Urinary tract infection, site not specified: Principal | ICD-10-CM | POA: Diagnosis present

## 2017-01-26 DIAGNOSIS — E7800 Pure hypercholesterolemia, unspecified: Secondary | ICD-10-CM | POA: Insufficient documentation

## 2017-01-26 DIAGNOSIS — J449 Chronic obstructive pulmonary disease, unspecified: Secondary | ICD-10-CM | POA: Diagnosis not present

## 2017-01-26 DIAGNOSIS — Z85118 Personal history of other malignant neoplasm of bronchus and lung: Secondary | ICD-10-CM | POA: Insufficient documentation

## 2017-01-26 DIAGNOSIS — N261 Atrophy of kidney (terminal): Secondary | ICD-10-CM | POA: Insufficient documentation

## 2017-01-26 DIAGNOSIS — Z9849 Cataract extraction status, unspecified eye: Secondary | ICD-10-CM | POA: Insufficient documentation

## 2017-01-26 DIAGNOSIS — R778 Other specified abnormalities of plasma proteins: Secondary | ICD-10-CM | POA: Diagnosis not present

## 2017-01-26 DIAGNOSIS — I495 Sick sinus syndrome: Secondary | ICD-10-CM | POA: Insufficient documentation

## 2017-01-26 DIAGNOSIS — N183 Chronic kidney disease, stage 3 (moderate): Secondary | ICD-10-CM | POA: Diagnosis not present

## 2017-01-26 DIAGNOSIS — I441 Atrioventricular block, second degree: Secondary | ICD-10-CM | POA: Insufficient documentation

## 2017-01-26 DIAGNOSIS — B9620 Unspecified Escherichia coli [E. coli] as the cause of diseases classified elsewhere: Secondary | ICD-10-CM | POA: Diagnosis not present

## 2017-01-26 DIAGNOSIS — M5116 Intervertebral disc disorders with radiculopathy, lumbar region: Secondary | ICD-10-CM | POA: Insufficient documentation

## 2017-01-26 DIAGNOSIS — N179 Acute kidney failure, unspecified: Secondary | ICD-10-CM | POA: Insufficient documentation

## 2017-01-26 DIAGNOSIS — L509 Urticaria, unspecified: Secondary | ICD-10-CM | POA: Diagnosis not present

## 2017-01-26 DIAGNOSIS — G8929 Other chronic pain: Secondary | ICD-10-CM | POA: Insufficient documentation

## 2017-01-26 DIAGNOSIS — Z803 Family history of malignant neoplasm of breast: Secondary | ICD-10-CM | POA: Insufficient documentation

## 2017-01-26 DIAGNOSIS — N3289 Other specified disorders of bladder: Secondary | ICD-10-CM | POA: Insufficient documentation

## 2017-01-26 DIAGNOSIS — Z96653 Presence of artificial knee joint, bilateral: Secondary | ICD-10-CM | POA: Insufficient documentation

## 2017-01-26 DIAGNOSIS — I2510 Atherosclerotic heart disease of native coronary artery without angina pectoris: Secondary | ICD-10-CM | POA: Insufficient documentation

## 2017-01-26 DIAGNOSIS — Z8249 Family history of ischemic heart disease and other diseases of the circulatory system: Secondary | ICD-10-CM | POA: Insufficient documentation

## 2017-01-26 DIAGNOSIS — Z96612 Presence of left artificial shoulder joint: Secondary | ICD-10-CM | POA: Insufficient documentation

## 2017-01-26 DIAGNOSIS — Z8673 Personal history of transient ischemic attack (TIA), and cerebral infarction without residual deficits: Secondary | ICD-10-CM | POA: Insufficient documentation

## 2017-01-26 DIAGNOSIS — Z86010 Personal history of colonic polyps: Secondary | ICD-10-CM | POA: Diagnosis not present

## 2017-01-26 DIAGNOSIS — I723 Aneurysm of iliac artery: Secondary | ICD-10-CM | POA: Insufficient documentation

## 2017-01-26 DIAGNOSIS — A498 Other bacterial infections of unspecified site: Secondary | ICD-10-CM

## 2017-01-26 DIAGNOSIS — I10 Essential (primary) hypertension: Secondary | ICD-10-CM

## 2017-01-26 DIAGNOSIS — Z7982 Long term (current) use of aspirin: Secondary | ICD-10-CM | POA: Insufficient documentation

## 2017-01-26 DIAGNOSIS — N189 Chronic kidney disease, unspecified: Secondary | ICD-10-CM

## 2017-01-26 DIAGNOSIS — Z87891 Personal history of nicotine dependence: Secondary | ICD-10-CM | POA: Insufficient documentation

## 2017-01-26 DIAGNOSIS — R7989 Other specified abnormal findings of blood chemistry: Secondary | ICD-10-CM

## 2017-01-26 DIAGNOSIS — F419 Anxiety disorder, unspecified: Secondary | ICD-10-CM | POA: Insufficient documentation

## 2017-01-26 DIAGNOSIS — R531 Weakness: Secondary | ICD-10-CM | POA: Insufficient documentation

## 2017-01-26 DIAGNOSIS — Z950 Presence of cardiac pacemaker: Secondary | ICD-10-CM | POA: Diagnosis not present

## 2017-01-26 DIAGNOSIS — N289 Disorder of kidney and ureter, unspecified: Secondary | ICD-10-CM | POA: Diagnosis not present

## 2017-01-26 DIAGNOSIS — E785 Hyperlipidemia, unspecified: Secondary | ICD-10-CM | POA: Insufficient documentation

## 2017-01-26 DIAGNOSIS — D649 Anemia, unspecified: Secondary | ICD-10-CM | POA: Insufficient documentation

## 2017-01-26 DIAGNOSIS — E860 Dehydration: Secondary | ICD-10-CM | POA: Diagnosis not present

## 2017-01-26 DIAGNOSIS — G44229 Chronic tension-type headache, not intractable: Secondary | ICD-10-CM | POA: Insufficient documentation

## 2017-01-26 DIAGNOSIS — G5710 Meralgia paresthetica, unspecified lower limb: Secondary | ICD-10-CM | POA: Insufficient documentation

## 2017-01-26 DIAGNOSIS — I129 Hypertensive chronic kidney disease with stage 1 through stage 4 chronic kidney disease, or unspecified chronic kidney disease: Secondary | ICD-10-CM | POA: Diagnosis not present

## 2017-01-26 DIAGNOSIS — Z8546 Personal history of malignant neoplasm of prostate: Secondary | ICD-10-CM | POA: Insufficient documentation

## 2017-01-26 DIAGNOSIS — I714 Abdominal aortic aneurysm, without rupture: Secondary | ICD-10-CM | POA: Insufficient documentation

## 2017-01-26 DIAGNOSIS — I700 Atherosclerosis of aorta: Secondary | ICD-10-CM | POA: Insufficient documentation

## 2017-01-26 DIAGNOSIS — N281 Cyst of kidney, acquired: Secondary | ICD-10-CM | POA: Insufficient documentation

## 2017-01-26 DIAGNOSIS — N400 Enlarged prostate without lower urinary tract symptoms: Secondary | ICD-10-CM | POA: Insufficient documentation

## 2017-01-26 DIAGNOSIS — M1990 Unspecified osteoarthritis, unspecified site: Secondary | ICD-10-CM | POA: Diagnosis not present

## 2017-01-26 DIAGNOSIS — Z7902 Long term (current) use of antithrombotics/antiplatelets: Secondary | ICD-10-CM | POA: Insufficient documentation

## 2017-01-26 DIAGNOSIS — I25810 Atherosclerosis of coronary artery bypass graft(s) without angina pectoris: Secondary | ICD-10-CM | POA: Insufficient documentation

## 2017-01-26 DIAGNOSIS — Z8679 Personal history of other diseases of the circulatory system: Secondary | ICD-10-CM | POA: Insufficient documentation

## 2017-01-26 DIAGNOSIS — M7530 Calcific tendinitis of unspecified shoulder: Secondary | ICD-10-CM | POA: Insufficient documentation

## 2017-01-26 DIAGNOSIS — M5412 Radiculopathy, cervical region: Secondary | ICD-10-CM | POA: Insufficient documentation

## 2017-01-26 DIAGNOSIS — Z79899 Other long term (current) drug therapy: Secondary | ICD-10-CM | POA: Insufficient documentation

## 2017-01-26 DIAGNOSIS — Z825 Family history of asthma and other chronic lower respiratory diseases: Secondary | ICD-10-CM | POA: Insufficient documentation

## 2017-01-26 DIAGNOSIS — N19 Unspecified kidney failure: Secondary | ICD-10-CM

## 2017-01-26 LAB — BASIC METABOLIC PANEL
ANION GAP: 9 (ref 5–15)
BUN: 43 mg/dL — ABNORMAL HIGH (ref 6–20)
CALCIUM: 9.3 mg/dL (ref 8.9–10.3)
CO2: 20 mmol/L — ABNORMAL LOW (ref 22–32)
Chloride: 107 mmol/L (ref 101–111)
Creatinine, Ser: 2.82 mg/dL — ABNORMAL HIGH (ref 0.61–1.24)
GFR calc Af Amer: 23 mL/min — ABNORMAL LOW (ref >60)
GFR, EST NON AFRICAN AMERICAN: 20 mL/min — AB (ref >60)
GLUCOSE: 153 mg/dL — AB (ref 65–99)
Potassium: 4.3 mmol/L (ref 3.5–5.1)
Sodium: 136 mmol/L (ref 135–145)

## 2017-01-26 LAB — CBC
HCT: 36.9 % — ABNORMAL LOW (ref 40.0–52.0)
HEMOGLOBIN: 12.6 g/dL — AB (ref 13.0–18.0)
MCH: 29.7 pg (ref 26.0–34.0)
MCHC: 34.1 g/dL (ref 32.0–36.0)
MCV: 87.1 fL (ref 80.0–100.0)
Platelets: 171 10*3/uL (ref 150–440)
RBC: 4.23 MIL/uL — AB (ref 4.40–5.90)
RDW: 14.7 % — ABNORMAL HIGH (ref 11.5–14.5)
WBC: 9.2 10*3/uL (ref 3.8–10.6)

## 2017-01-26 LAB — URINALYSIS, COMPLETE (UACMP) WITH MICROSCOPIC
BACTERIA UA: NONE SEEN
Bilirubin Urine: NEGATIVE
GLUCOSE, UA: NEGATIVE mg/dL
HGB URINE DIPSTICK: NEGATIVE
Ketones, ur: NEGATIVE mg/dL
NITRITE: NEGATIVE
PH: 5 (ref 5.0–8.0)
Protein, ur: 100 mg/dL — AB
Specific Gravity, Urine: 1.025 (ref 1.005–1.030)
Squamous Epithelial / LPF: NONE SEEN

## 2017-01-26 LAB — TROPONIN I: TROPONIN I: 0.03 ng/mL — AB (ref <0.03)

## 2017-01-26 MED ORDER — NIACIN ER (ANTIHYPERLIPIDEMIC) 500 MG PO TBCR
500.0000 mg | EXTENDED_RELEASE_TABLET | Freq: Every day | ORAL | Status: DC
  Administered 2017-01-27 – 2017-01-28 (×2): 500 mg via ORAL
  Filled 2017-01-26 (×4): qty 1

## 2017-01-26 MED ORDER — ACETAMINOPHEN 650 MG RE SUPP: 650.0000 mg | Freq: Four times a day (QID) | RECTAL | Status: DC | PRN

## 2017-01-26 MED ORDER — SODIUM CHLORIDE 0.9 % IV BOLUS (SEPSIS): 1000.0000 mL | Freq: Once | INTRAVENOUS | Status: DC

## 2017-01-26 MED ORDER — CEFTRIAXONE SODIUM 1 G IJ SOLR
1.0000 g | INTRAMUSCULAR | Status: DC
  Administered 2017-01-27: 1 g via INTRAVENOUS
  Filled 2017-01-26: qty 0
  Filled 2017-01-26: qty 10

## 2017-01-26 MED ORDER — ASPIRIN EC 81 MG PO TBEC
81.0000 mg | DELAYED_RELEASE_TABLET | Freq: Every day | ORAL | Status: DC
  Administered 2017-01-27 – 2017-01-28 (×2): 81 mg via ORAL
  Filled 2017-01-26 (×2): qty 1

## 2017-01-26 MED ORDER — CLOPIDOGREL BISULFATE 75 MG PO TABS
75.0000 mg | ORAL_TABLET | Freq: Every day | ORAL | Status: DC
  Administered 2017-01-27 – 2017-01-28 (×2): 75 mg via ORAL
  Filled 2017-01-26 (×2): qty 1

## 2017-01-26 MED ORDER — CARISOPRODOL 350 MG PO TABS
350.0000 mg | ORAL_TABLET | Freq: Four times a day (QID) | ORAL | Status: DC | PRN
  Administered 2017-01-27: 350 mg via ORAL
  Filled 2017-01-26: qty 1

## 2017-01-26 MED ORDER — SODIUM CHLORIDE 0.9 % IV BOLUS (SEPSIS)
1000.0000 mL | Freq: Once | INTRAVENOUS | Status: AC
  Administered 2017-01-26: 1000 mL via INTRAVENOUS

## 2017-01-26 MED ORDER — CYCLOBENZAPRINE HCL 10 MG PO TABS
10.0000 mg | ORAL_TABLET | Freq: Every day | ORAL | Status: DC
  Administered 2017-01-26 – 2017-01-27 (×2): 10 mg via ORAL
  Filled 2017-01-26 (×2): qty 1

## 2017-01-26 MED ORDER — DONEPEZIL HCL 5 MG PO TABS
5.0000 mg | ORAL_TABLET | Freq: Every day | ORAL | Status: DC
  Administered 2017-01-26 – 2017-01-27 (×2): 5 mg via ORAL
  Filled 2017-01-26 (×2): qty 1

## 2017-01-26 MED ORDER — DOCUSATE SODIUM 100 MG PO CAPS
100.0000 mg | ORAL_CAPSULE | Freq: Every day | ORAL | Status: DC
  Administered 2017-01-27 – 2017-01-28 (×2): 100 mg via ORAL
  Filled 2017-01-26 (×2): qty 1

## 2017-01-26 MED ORDER — DEXTROSE 5 % IV SOLN: 1.0000 g | Freq: Once | INTRAVENOUS | Status: DC

## 2017-01-26 MED ORDER — ATORVASTATIN CALCIUM 20 MG PO TABS
40.0000 mg | ORAL_TABLET | Freq: Every day | ORAL | Status: DC
  Administered 2017-01-26 – 2017-01-27 (×2): 40 mg via ORAL
  Filled 2017-01-26 (×2): qty 2

## 2017-01-26 MED ORDER — LISINOPRIL 5 MG PO TABS: 5.0000 mg | ORAL_TABLET | Freq: Every day | ORAL | Status: DC

## 2017-01-26 MED ORDER — PANTOPRAZOLE SODIUM 40 MG PO TBEC
40.0000 mg | DELAYED_RELEASE_TABLET | Freq: Every day | ORAL | Status: DC
  Administered 2017-01-27 – 2017-01-28 (×2): 40 mg via ORAL
  Filled 2017-01-26 (×2): qty 1

## 2017-01-26 MED ORDER — ACETAMINOPHEN 325 MG PO TABS: 650.0000 mg | ORAL_TABLET | Freq: Four times a day (QID) | ORAL | Status: DC | PRN

## 2017-01-26 MED ORDER — OCUVITE-LUTEIN PO CAPS
1.0000 | ORAL_CAPSULE | Freq: Two times a day (BID) | ORAL | Status: DC
  Administered 2017-01-26 – 2017-01-28 (×4): 1 via ORAL
  Filled 2017-01-26 (×4): qty 1

## 2017-01-26 MED ORDER — LORATADINE 10 MG PO TABS
10.0000 mg | ORAL_TABLET | Freq: Every day | ORAL | Status: DC
  Administered 2017-01-27 – 2017-01-28 (×2): 10 mg via ORAL
  Filled 2017-01-26 (×2): qty 1

## 2017-01-26 MED ORDER — CEFTRIAXONE SODIUM-DEXTROSE 1-3.74 GM-% IV SOLR
1.0000 g | Freq: Once | INTRAVENOUS | Status: AC
  Administered 2017-01-26: 1 g via INTRAVENOUS
  Filled 2017-01-26: qty 50

## 2017-01-26 MED ORDER — ZOLPIDEM TARTRATE 5 MG PO TABS
5.0000 mg | ORAL_TABLET | Freq: Every day | ORAL | Status: DC
  Administered 2017-01-26 – 2017-01-27 (×2): 5 mg via ORAL
  Filled 2017-01-26 (×2): qty 1

## 2017-01-26 MED ORDER — HEPARIN SODIUM (PORCINE) 5000 UNIT/ML IJ SOLN
5000.0000 [IU] | Freq: Three times a day (TID) | INTRAMUSCULAR | Status: DC
  Administered 2017-01-26 – 2017-01-28 (×5): 5000 [IU] via SUBCUTANEOUS
  Filled 2017-01-26 (×5): qty 1

## 2017-01-26 MED ORDER — ADULT MULTIVITAMIN W/MINERALS CH
1.0000 | ORAL_TABLET | Freq: Every day | ORAL | Status: DC
  Administered 2017-01-27 – 2017-01-28 (×2): 1 via ORAL
  Filled 2017-01-26 (×2): qty 1

## 2017-01-26 MED ORDER — SODIUM CHLORIDE 0.9 % IV SOLN
INTRAVENOUS | Status: DC
  Administered 2017-01-26 – 2017-01-27 (×2): via INTRAVENOUS

## 2017-01-26 MED ORDER — OXYCODONE-ACETAMINOPHEN 5-325 MG PO TABS
1.0000 | ORAL_TABLET | ORAL | Status: DC | PRN
  Administered 2017-01-26: 20:00:00 1 via ORAL
  Administered 2017-01-27 – 2017-01-28 (×6): 2 via ORAL
  Filled 2017-01-26 (×2): qty 2
  Filled 2017-01-26: qty 1
  Filled 2017-01-26 (×4): qty 2

## 2017-01-26 MED ORDER — ONDANSETRON HCL 4 MG/2ML IJ SOLN: 4.0000 mg | Freq: Four times a day (QID) | INTRAMUSCULAR | Status: DC | PRN

## 2017-01-26 MED ORDER — DEXTROSE 5 % IV SOLN: 1.0000 g | INTRAVENOUS | Status: DC

## 2017-01-26 MED ORDER — ONDANSETRON HCL 4 MG PO TABS: 4.0000 mg | ORAL_TABLET | Freq: Four times a day (QID) | ORAL | Status: DC | PRN

## 2017-01-26 NOTE — ED Notes (Signed)
Pt taken to XRAY

## 2017-01-26 NOTE — H&P (Signed)
Rick Mcbride NAME: Rick Mcbride    MR#:  [id number]  DATE OF BIRTH:  Oct 29, 1941  DATE OF ADMISSION:  01/26/2017  PRIMARY CARE PHYSICIAN: Idelle Crouch, MD   REQUESTING/REFERRING PHYSICIAN: Dr. Harvest Dark  CHIEF COMPLAINT:   Chief Complaint  Patient presents with  . Weakness    HISTORY OF PRESENT ILLNESS:  Rick Mcbride  is a 76 y.o. male with a known history of Abdominal aortic aneurysm status post recent repair, chronic kidney disease stage III, coronary artery disease status post bypass, degenerative disc disease with chronic back pain, history of GERD, prostate cancer, osteoarthritis who presented to the hospital due to generalized weakness and difficulty ambulating. Patient just 2 weeks ago had abdominal aortic aneurysm repair and has been doing well but over the past 2-3 days he has been having significant weakness and difficulty ambulating. He does have chronic back pain and degenerative disc disease in his back for which he needs neurosurgical intervention but that is to be planned as outpatient.  As per the wife patient has had significant weakness and could not even get up from his chair today and therefore came to the ER for further evaluation. Patient was noted to be in acute on chronic kidney injury and also noted to have a urinary tract infection hospitalist services were contacted further treatment and evaluation.  PAST MEDICAL HISTORY:   Past Medical History:  Diagnosis Date  . AAA (abdominal aortic aneurysm) ([place])   . AAA (abdominal aortic aneurysm) without rupture ([place])   . Anemia   . Aneurysm ([place])    abd aortic  . Anxiety   . Atrophic kidney   . Cervical radiculopathy   . Chronic airway obstruction ([place])   . Chronic kidney disease (CKD), stage III (moderate)    followed by Dr. Johnny Bridge  . Chronic tension headaches   . Coronary artery disease   . Coronary atherosclerosis of autologous vein bypass  graft   . DDD (degenerative disc disease), lumbar   . Degenerative disc disease, lumbar    with lumbar radiculopathy  . Elbow fracture, left   . GERD (gastroesophageal reflux disease)   . H/O adenomatous polyp of colon   . H/O hemorrhoids   . H/O urticaria   . Headache   . Heart disease   . Hypercholesteremia   . Hyperlipidemia   . 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 Lucky Cowboy  . Lung cancer ([place])   . Lung cancer ([place])   . Meralgia paresthetica   . Meralgia paresthetica   . Neuralgia   . Osteoarthritis   . Osteoarthritis    s/p L knee surgery  . 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 Presence of permanent cardiac pacemaker   . Prostate cancer ([place])   . Second degree AV block    Followed by Dr. Nehemiah Massed  . Sinoatrial node dysfunction (HCC)   . Stroke ([place])   . TIA (transient ischemic attack)     PAST SURGICAL HISTORY:   Past Surgical History:  Procedure Laterality Date  . CATARACT EXTRACTION    . COLONOSCOPY    . COLONOSCOPY    . COLONOSCOPY WITH PROPOFOL N/A 10/20/2015   Procedure: COLONOSCOPY WITH PROPOFOL;  Surgeon: Manya Silvas, MD;  Location: [facility] ENDOSCOPY;  Service: Endoscopy;  Laterality: N/A;  . CORONARY ARTERY BYPASS GRAFT  triple  . coronary atherosclerosis of autologous vein bypass graft    . EMBOLIZATION Right 12/27/2016   Procedure: Embolization;  Surgeon: Algernon Huxley, MD;  Location: [place] CV LAB;  Service: Cardiovascular;  Laterality: Right;  . ENDOVASCULAR REPAIR/STENT GRAFT N/A 01/05/2017   Procedure: Endovascular Repair/Stent Graft;  Surgeon: Algernon Huxley, MD;  Location: [place] CV LAB;  Service: Cardiovascular;  Laterality: N/A;  . EYE SURGERY Bilateral    cataract extraction  . JOINT REPLACEMENT     shoulder and knees  . KNEE ARTHROSCOPY    . LOBECTOMY  01/31/13   RLL w/squamous cell carcinoma lobectomy  . LUNG REMOVAL, PARTIAL  2014   right lower lobe   . PACEMAKER INSERTION    . PACEMAKER INSERTION  12/2012   Dual chanber pacemaker generator  . POLYPECTOMY    . PROSTATECTOMY    . TOTAL KNEE ARTHROPLASTY Bilateral   . TOTAL SHOULDER ARTHROPLASTY Left 07/10/2015   Procedure: TOTAL SHOULDER ARTHROPLASTY;  Surgeon: [name], MD;  Location: [facility] ORS;  Service: Orthopedics;  Laterality: Left;  . TOTAL SHOULDER REPLACEMENT      SOCIAL HISTORY:   Social History  Substance Use Topics  . Smoking status: Former Smoker    Packs/day: 1.50    Years: 45.00    Quit date: 04/07/2004  . Smokeless tobacco: Never Used  . Alcohol use No    FAMILY HISTORY:   Family History  Problem Relation Age of Onset  . Heart attack Mother   . Heart attack Father   . Breast cancer Sister   . Asthma Sister     DRUG ALLERGIES:  No Known Allergies  REVIEW OF SYSTEMS:   Review of Systems  Constitutional: Negative for fever and weight loss.  HENT: Negative for congestion, nosebleeds and tinnitus.   Eyes: Negative for blurred vision, double vision and redness.  Respiratory: Negative for cough, hemoptysis and shortness of breath.   Cardiovascular: Negative for chest pain, orthopnea, leg swelling and PND.  Gastrointestinal: Negative for abdominal pain, diarrhea, melena, nausea and vomiting.  Genitourinary: Negative for dysuria, hematuria and urgency.  Musculoskeletal: Negative for falls and joint pain.  Neurological: Positive for weakness. Negative for dizziness, tingling, sensory change, focal weakness, seizures and headaches.  Endo/Heme/Allergies: Negative for polydipsia. Does not bruise/bleed easily.  Psychiatric/Behavioral: Negative for depression and memory loss. The patient is not nervous/anxious.     MEDICATIONS AT HOME:   Prior to Admission medications   Medication Sig Start Date End Date Taking? Authorizing Provider  aspirin EC 81 MG tablet Take 81 mg by mouth daily.    Yes Historical Provider, MD  atorvastatin (LIPITOR) 40 MG tablet Take  40 mg by mouth daily at 6 PM.  10/30/14  Yes Historical Provider, MD  Calcium Carb-Cholecalciferol (CALCIUM 600 + D PO) Take 1 tablet by mouth daily.   Yes Historical Provider, MD  carisoprodol (SOMA) 350 MG tablet Take 350 mg by mouth 4 (four) times daily as needed for muscle spasms.   Yes Historical Provider, MD  cetirizine (ZYRTEC) 10 MG tablet Take 10 mg by mouth daily.   Yes Historical Provider, MD  clopidogrel (PLAVIX) 75 MG tablet Take 75 mg by mouth daily.  10/30/14  Yes Historical Provider, MD  clotrimazole-betamethasone (LOTRISONE) cream Apply 1 application topically 2 (two) times daily.   Yes Historical Provider, MD  cyclobenzaprine (FLEXERIL) 10 MG tablet Take 10 mg by mouth at bedtime.    Yes Historical Provider, MD  docusate sodium (COLACE)  100 MG capsule Take 100 mg by mouth daily.   Yes Historical Provider, MD  donepezil (ARICEPT) 5 MG tablet Take 5 mg by mouth at bedtime.   Yes Historical Provider, MD  ferrous sulfate (SLOW FE) 160 (50 FE) MG TBCR SR tablet Take 1 tablet by mouth 2 (two) times daily.    Yes Historical Provider, MD  lisinopril (PRINIVIL,ZESTRIL) 5 MG tablet Take 5 mg by mouth daily.  10/30/14  Yes Historical Provider, MD  Multiple Vitamins-Minerals (CENTRUM SILVER ULTRA MENS) TABS Take 1 tablet by mouth daily.    Yes Historical Provider, MD  Multiple Vitamins-Minerals (PRESERVISION/LUTEIN) CAPS Take 1 capsule by mouth 2 (two) times daily.    Yes Historical Provider, MD  niacin (NIASPAN) 500 MG CR tablet Take 500 mg by mouth daily.  10/30/14  Yes Historical Provider, MD  Omega-3 Fatty Acids (FISH OIL) 1200 MG CAPS Take 2 capsules by mouth daily.    Yes Historical Provider, MD  omeprazole (PRILOSEC) 20 MG capsule Take 20 mg by mouth daily.  10/30/14  Yes Historical Provider, MD  oxyCODONE-acetaminophen (PERCOCET/ROXICET) 5-325 MG tablet Take 1-2 tablets by mouth every 4 (four) hours as needed for moderate pain. 01/06/17  Yes Algernon Huxley, MD  traMADol-acetaminophen  (ULTRACET) 37.5-325 MG per tablet 1 tablet every 6 (six) hours as needed for moderate pain or severe pain.  01/12/15  Yes Historical Provider, MD  vitamin B-12 (CYANOCOBALAMIN) 1000 MCG tablet Take 1,000 mcg by mouth daily.   Yes Historical Provider, MD  zolpidem (AMBIEN) 10 MG tablet Take 10 mg by mouth at bedtime.  10/30/14  Yes Historical Provider, MD      VITAL SIGNS:  Blood pressure 118/72, pulse 68, temperature 98 F (36.7 C), temperature source Oral, resp. rate 15, height '5\' 11"'$  (1.803 m), weight 104.3 kg (230 lb), SpO2 100 %.  PHYSICAL EXAMINATION:  Physical Exam  GENERAL:  76 y.o.-year-old patient lying in the bed with no acute distress.  EYES: Pupils equal, round, reactive to light and accommodation. No scleral icterus. Extraocular muscles intact.  HEENT: Head atraumatic, normocephalic. Oropharynx and nasopharynx clear. No oropharyngeal erythema, moist oral mucosa  NECK:  Supple, no jugular venous distention. No thyroid enlargement, no tenderness.  LUNGS: Normal breath sounds bilaterally, no wheezing, rales, rhonchi. No use of accessory muscles of respiration.  CARDIOVASCULAR: S1, S2 RRR. No murmurs, rubs, gallops, clicks.  ABDOMEN: Soft, nontender, nondistended. Bowel sounds present. No organomegaly or mass.  EXTREMITIES: No pedal edema, cyanosis, or clubbing. + 2 pedal & radial pulses b/l.   NEUROLOGIC: Cranial nerves II through XII are intact. No focal Motor or sensory deficits appreciated b/l. Globally weak. PSYCHIATRIC: The patient is alert and oriented x 3. Good affect.  SKIN: No obvious rash, lesion, or ulcer.   LABORATORY PANEL:   CBC  Recent Labs Lab 01/26/17 1012  WBC 9.2  HGB 12.6*  HCT 36.9*  PLT 171   ------------------------------------------------------------------------------------------------------------------  Chemistries   Recent Labs Lab 01/26/17 1012  NA 136  K 4.3  CL 107  CO2 20*  GLUCOSE 153*  BUN 43*  CREATININE 2.82*  CALCIUM 9.3    ------------------------------------------------------------------------------------------------------------------  Cardiac Enzymes  Recent Labs Lab 01/26/17 1012  TROPONINI 0.03*   ------------------------------------------------------------------------------------------------------------------  RADIOLOGY:  Dg Chest 2 View  Result Date: 01/26/2017 CLINICAL DATA:  Lower extremity weakness.  Cardiac arrhythmia. EXAM: CHEST  2 VIEW COMPARISON:  October 06, 2016 FINDINGS: There is postoperative change on the right with volume loss the right base. There is scarring  in the right base with chronic blunting of the right costophrenic angle. There is milder scarring in the left base. There is no edema or consolidation. Heart is upper normal in size with pulmonary vascularity within normal limits. Pacemaker leads are attached to the right atrium and right ventricle, unchanged. Patient is status post coronary artery bypass grafting. There is atherosclerotic calcification in the aorta. No adenopathy. There are total shoulder replacements bilaterally. IMPRESSION: Postoperative change with scarring and volume loss the right. Slight scarring left base. No edema or consolidation. Stable cardiac silhouette. There is aortic atherosclerosis. No change in pacemaker lead positioning. Electronically Signed   By: Lowella Grip III M.D.   On: 01/26/2017 10:57     IMPRESSION AND PLAN:   76 year old male with past medical history of Abdominal aortic aneurysm status post recent repair, chronic kidney disease stage III, coronary artery disease status post bypass, degenerative disc disease with chronic back pain, history of GERD, prostate cancer, osteoarthritis presented to the hospital due to generalized weakness and difficulty walking.  1. Generalized weakness/difficulty walking-etiology unclear suspected to be underlying UTI with underlying chronic back pain and degenerative disc disease. -We'll treat UTI with  IV ceftriaxone, we'll get a physical therapy evaluation to assess mobility.  2. UTI - IV Ceftriaxone and follow urine cultures.   3. Acute on chronic renal failure-patient's baseline creatinine is around 1.8 1.9. Currently elevated 2.8. -We'll hydrate with IV fluids and follow BUN/creatinine.  4. Essential hypertension-continue lisinopril  5. History of peripheral vascular disease-continue aspirin, Plavix, statin.  6. Dementia-continue Aricept.  All the records are reviewed and case discussed with ED provider. Management plans discussed with the patient, family and they are in agreement.  CODE STATUS: Full code  TOTAL TIME TAKING CARE OF THIS PATIENT: 40 minutes.    Henreitta Leber M.D on 01/26/2017 at 3:13 PM  Between 7am to 6pm - Pager - [phone]  After 6pm go to [email or web address] - password [email or web address] [facility]  Office  [phone]  CC: Primary care physician; Idelle Crouch, MD

## 2017-01-26 NOTE — ED Provider Notes (Signed)
[facility] Emergency Department Provider Note  Time seen: 10:33 AM  I have reviewed the triage vital signs and the nursing notes.   HISTORY  Chief Complaint Weakness    HPI Rick Mcbride Rick Mcbride. is a 76 y.o. male with multiple medical problems including recent AAA repair 2 weeks ago, chronic back pain, presents to the emergency department with generalized weakness. According to the patient he had been doing well since his intravascular AAA repair 2 weeks ago, however for the past 3 days he has felt extremely weak this has progressively worsened, today the patient felt he could not get out of bed due to generalized fatigue and weakness. Denies any focal deficits. Denies any fever, nausea, vomiting, diarrhea, dysuria, hematuria, cough, congestion. Denies headache.  Past Medical History:  Diagnosis Date  . AAA (abdominal aortic aneurysm) ([place])   . AAA (abdominal aortic aneurysm) without rupture ([place])   . Anemia   . Aneurysm ([place])    abd aortic  . Anxiety   . Atrophic kidney   . Cervical radiculopathy   . Chronic airway obstruction ([place])   . Chronic kidney disease (CKD), stage III (moderate)    followed by Dr. Johnny Bridge  . Chronic tension headaches   . Coronary artery disease   . Coronary atherosclerosis of autologous vein bypass graft   . DDD (degenerative disc disease), lumbar   . Degenerative disc disease, lumbar    with lumbar radiculopathy  . Elbow fracture, left   . GERD (gastroesophageal reflux disease)   . H/O adenomatous polyp of colon   . H/O hemorrhoids   . H/O urticaria   . Headache   . Heart disease   . Hypercholesteremia   . Hyperlipidemia   . 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 Lucky Cowboy  . Lung cancer ([place])   . Lung cancer ([place])   . Meralgia paresthetica   . Meralgia paresthetica   . Neuralgia   . Osteoarthritis   . Osteoarthritis    s/p L knee surgery  . 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 Presence of permanent cardiac pacemaker   . Prostate cancer ([place])   . Second degree AV block    Followed by Dr. Nehemiah Massed  . Sinoatrial node dysfunction (HCC)   . Stroke ([place])   . TIA (transient ischemic attack)     Patient Active Problem List   Diagnosis Date Noted  . AAA (abdominal aortic aneurysm) without rupture ([place]) 11/23/2016  . Chronic obstructive pulmonary disease ([place]) 10/09/2015  . Personal history of diseases of skin or subcutaneous tissue 10/09/2015  . Malignant neoplasm of prostate ([place]) 10/09/2015  . Pure hypercholesterolemia 10/09/2015  . Sinoatrial node dysfunction ([place]) 10/09/2015  . History of surgical procedure 08/22/2015  . Status post total shoulder replacement 07/10/2015  . Arthritis of shoulder region, degenerative 06/10/2015  . Benign essential HTN 06/03/2015  . Absolute anemia 04/12/2015  . Atrophic kidney 04/12/2015  . Essential (primary) hypertension 04/12/2015  . Acid reflux 04/12/2015  . Cancer of lung ([place]) 04/12/2015  . CA of prostate ([place]) 04/12/2015  . H/O adenomatous polyp of colon 01/15/2015  . Temporary cerebral vascular dysfunction 11/21/2014  . Calcific shoulder tendinitis 08/01/2014  . Cervical nerve root disorder 04/07/2012    Past Surgical History:  Procedure Laterality Date  . CATARACT EXTRACTION    . COLONOSCOPY    . COLONOSCOPY    .  COLONOSCOPY WITH PROPOFOL N/A 10/20/2015   Procedure: COLONOSCOPY WITH PROPOFOL;  Surgeon: Manya Silvas, MD;  Location: [facility] ENDOSCOPY;  Service: Endoscopy;  Laterality: N/A;  . CORONARY ARTERY BYPASS GRAFT     triple  . coronary atherosclerosis of autologous vein bypass graft    . EMBOLIZATION Right 12/27/2016   Procedure: Embolization;  Surgeon: Algernon Huxley, MD;  Location: [place] CV LAB;  Service: Cardiovascular;  Laterality: Right;  . ENDOVASCULAR REPAIR/STENT GRAFT N/A 01/05/2017   Procedure: Endovascular Repair/Stent Graft;  Surgeon: Algernon Huxley, MD;  Location: [place] CV LAB;  Service: Cardiovascular;  Laterality: N/A;  . EYE SURGERY Bilateral    cataract extraction  . JOINT REPLACEMENT     shoulder and knees  . KNEE ARTHROSCOPY    . LOBECTOMY  01/31/13   RLL w/squamous cell carcinoma lobectomy  . LUNG REMOVAL, PARTIAL  2014   right lower lobe  . PACEMAKER INSERTION    . PACEMAKER INSERTION  12/2012   Dual chanber pacemaker generator  . POLYPECTOMY    . PROSTATECTOMY    . TOTAL KNEE ARTHROPLASTY Bilateral   . TOTAL SHOULDER ARTHROPLASTY Left 07/10/2015   Procedure: TOTAL SHOULDER ARTHROPLASTY;  Surgeon: [name], MD;  Location: [facility] ORS;  Service: Orthopedics;  Laterality: Left;  . TOTAL SHOULDER REPLACEMENT      Prior to Admission medications   Medication Sig Start Date End Date Taking? Authorizing Provider  aspirin EC 81 MG tablet Take 81 mg by mouth daily.     Historical Provider, MD  atorvastatin (LIPITOR) 40 MG tablet Take 40 mg by mouth daily at 6 PM.  10/30/14   Historical Provider, MD  Calcium Carb-Cholecalciferol (CALCIUM 600 + D PO) Take 1 tablet by mouth daily.    Historical Provider, MD  carisoprodol (SOMA) 350 MG tablet Take 350 mg by mouth 4 (four) times daily as needed for muscle spasms.    Historical Provider, MD  cetirizine (ZYRTEC) 10 MG tablet Take 10 mg by mouth daily.    Historical Provider, MD  clopidogrel (PLAVIX) 75 MG tablet Take 75 mg by mouth daily.  10/30/14   Historical Provider, MD  clotrimazole-betamethasone (LOTRISONE) cream Apply 1 application topically 2 (two) times daily.    Historical Provider, MD  cyclobenzaprine (FLEXERIL) 10 MG tablet Take 10 mg by mouth 3 (three) times daily as needed for muscle spasms.    Historical Provider, MD  docusate sodium (COLACE) 100 MG capsule Take 100 mg by mouth daily.    Historical Provider, MD  donepezil (ARICEPT) 5 MG tablet Take 5 mg by mouth at bedtime.    Historical Provider, MD  ferrous sulfate (SLOW FE) 160 (50 FE) MG TBCR SR tablet Take  1 tablet by mouth 2 (two) times daily.     Historical Provider, MD  lisinopril (PRINIVIL,ZESTRIL) 5 MG tablet Take 5 mg by mouth daily.  10/30/14   Historical Provider, MD  Multiple Vitamins-Minerals (CENTRUM SILVER ULTRA MENS) TABS Take 1 tablet by mouth daily.     Historical Provider, MD  Multiple Vitamins-Minerals (PRESERVISION/LUTEIN) CAPS Take 1 capsule by mouth 2 (two) times daily.     Historical Provider, MD  niacin (NIASPAN) 500 MG CR tablet Take 500 mg by mouth daily.  10/30/14   Historical Provider, MD  Omega-3 Fatty Acids (FISH OIL) 1200 MG CAPS Take 2 capsules by mouth 2 (two) times daily.     Historical Provider, MD  omeprazole (PRILOSEC) 20 MG capsule Take 20 mg by mouth  daily.  10/30/14   Historical Provider, MD  oxyCODONE-acetaminophen (PERCOCET/ROXICET) 5-325 MG tablet Take 1-2 tablets by mouth every 4 (four) hours as needed for moderate pain. 01/06/17   Algernon Huxley, MD  traMADol-acetaminophen (ULTRACET) 37.5-325 MG per tablet 1 tablet every 6 (six) hours as needed for moderate pain or severe pain.  01/12/15   Historical Provider, MD  vitamin B-12 (CYANOCOBALAMIN) 1000 MCG tablet Take 1,000 mcg by mouth daily.    Historical Provider, MD  zolpidem (AMBIEN) 10 MG tablet Take 10 mg by mouth at bedtime.  10/30/14   Historical Provider, MD    No Known Allergies  Family History  Problem Relation Age of Onset  . Heart attack Mother   . Heart attack Father   . Breast cancer Sister   . Asthma Sister     Social History Social History  Substance Use Topics  . Smoking status: Former Smoker    Packs/day: 1.50    Years: 45.00    Quit date: 04/07/2004  . Smokeless tobacco: Never Used  . Alcohol use No    Review of Systems Constitutional: Negative for fever Cardiovascular: Negative for chest pain. Respiratory: Negative for shortness of breath. Gastrointestinal: Negative for abdominal pain, vomiting and diarrhea. Musculoskeletal: Negative for back pain. Neurological: Negative for  headaches, focal weakness or numbness. Positive for generalized weakness 10-point ROS otherwise negative.  ____________________________________________   PHYSICAL EXAM:  VITAL SIGNS: ED Triage Vitals [01/26/17 1011]  Enc Vitals Group     BP 117/65     Pulse Rate 95     Resp 18     Temp 98 F (36.7 C)     Temp Source Oral     SpO2 96 %     Weight 230 lb (104.3 kg)     Height '5\' 11"'$  (1.803 m)     Head Circumference      Peak Flow      Pain Score 5     Pain Loc      Pain Edu?      Excl. in [place]?     Constitutional: Alert and oriented. Well appearing and in no distress. Eyes: Normal exam ENT   Head: Normocephalic and atraumatic.   Mouth/Throat: Mucous membranes are moist. Cardiovascular: Normal rate, regular rhythm. No murmur Respiratory: Normal respiratory effort without tachypnea nor retractions. Breath sounds are clear  Gastrointestinal: Soft and nontender. No distention.  Musculoskeletal: Nontender with normal range of motion in all extremities. Intravascular insertion sites in bilateral femoral arteries appear well, no hematoma ecchymosis or tenderness. Neurologic:  Normal speech and language. No gross focal neurologic deficits  Skin:  Skin is warm, dry and intact.  Psychiatric: Mood and affect are normal.   ____________________________________________    EKG  EKG reviewed and interpreted by myself shows normal sinus rhythm at 97 bpm, narrow QRS, normal axis, normal intervals, nonspecific ST changes. No ST elevation.  ____________________________________________    RADIOLOGY  IMPRESSION: Postoperative change with scarring and volume loss the right. Slight scarring left base. No edema or consolidation. Stable cardiac silhouette. There is aortic atherosclerosis. No change in pacemaker lead positioning.  ____________________________________________   INITIAL IMPRESSION / ASSESSMENT AND PLAN / ED COURSE  Pertinent labs & imaging results that were  available during my care of the patient were reviewed by me and considered in my medical decision making (see chart for details).  Patient presents for generalized weakness 2 weeks after AAA repair. Negative review of systems, reassuring vitals. We will IV hydrate,  check labs, urinalysis, chest x-ray and closely monitor in the emergency department.  Labs are consistent with acute on chronic renal insufficiency. Urinalysis is resulted showing too numerous to count white blood cells. Consistent with likely urinary tract infection. I sent a urine culture. Given the acute on chronic renal insufficiency we will continue IV hydration for the patient. Patient feels he is too weak to go home, states he is too weak to get out of bed and feels like his legs are coming give out under him causing him to fall. Wife agrees that he is too weak to go home. Given his dehydration with acute on chronic renal insufficiency along with urinary tract infection we will continue to IV antibiotics, IV fluids and admitted to the hospital.   ____________________________________________   FINAL CLINICAL IMPRESSION(S) / ED DIAGNOSES  Generalized weakness Urinary tract infection Acute on chronic renal insufficiency   Harvest Dark, MD 01/26/17 1421

## 2017-01-26 NOTE — ED Triage Notes (Signed)
Pt had aortic repair the first of march and was doing well until the past week and is gotten so weak he is not able to ambulate.the patient is a/ox3.[place] Kitchen

## 2017-01-27 ENCOUNTER — Observation Stay: Payer: PPO

## 2017-01-27 DIAGNOSIS — R531 Weakness: Secondary | ICD-10-CM | POA: Diagnosis not present

## 2017-01-27 DIAGNOSIS — N390 Urinary tract infection, site not specified: Secondary | ICD-10-CM | POA: Diagnosis not present

## 2017-01-27 DIAGNOSIS — R748 Abnormal levels of other serum enzymes: Secondary | ICD-10-CM | POA: Diagnosis not present

## 2017-01-27 DIAGNOSIS — N19 Unspecified kidney failure: Secondary | ICD-10-CM | POA: Diagnosis not present

## 2017-01-27 DIAGNOSIS — N183 Chronic kidney disease, stage 3 (moderate): Secondary | ICD-10-CM | POA: Diagnosis not present

## 2017-01-27 DIAGNOSIS — I129 Hypertensive chronic kidney disease with stage 1 through stage 4 chronic kidney disease, or unspecified chronic kidney disease: Secondary | ICD-10-CM | POA: Diagnosis not present

## 2017-01-27 DIAGNOSIS — N179 Acute kidney failure, unspecified: Secondary | ICD-10-CM | POA: Diagnosis not present

## 2017-01-27 LAB — BASIC METABOLIC PANEL
ANION GAP: 6 (ref 5–15)
BUN: 34 mg/dL — ABNORMAL HIGH (ref 6–20)
CALCIUM: 8.5 mg/dL — AB (ref 8.9–10.3)
CO2: 23 mmol/L (ref 22–32)
Chloride: 108 mmol/L (ref 101–111)
Creatinine, Ser: 2.41 mg/dL — ABNORMAL HIGH (ref 0.61–1.24)
GFR, EST AFRICAN AMERICAN: 28 mL/min — AB (ref >60)
GFR, EST NON AFRICAN AMERICAN: 25 mL/min — AB (ref >60)
Glucose, Bld: 92 mg/dL (ref 65–99)
Potassium: 4.2 mmol/L (ref 3.5–5.1)
Sodium: 137 mmol/L (ref 135–145)

## 2017-01-27 LAB — CBC
HCT: 30.6 % — ABNORMAL LOW (ref 40.0–52.0)
HEMOGLOBIN: 10.2 g/dL — AB (ref 13.0–18.0)
MCH: 29.3 pg (ref 26.0–34.0)
MCHC: 33.3 g/dL (ref 32.0–36.0)
MCV: 87.9 fL (ref 80.0–100.0)
PLATELETS: 134 10*3/uL — AB (ref 150–440)
RBC: 3.48 MIL/uL — ABNORMAL LOW (ref 4.40–5.90)
RDW: 15 % — ABNORMAL HIGH (ref 11.5–14.5)
WBC: 4.6 10*3/uL (ref 3.8–10.6)

## 2017-01-27 MED ORDER — AMLODIPINE BESYLATE 5 MG PO TABS
5.0000 mg | ORAL_TABLET | Freq: Every day | ORAL | Status: DC
  Administered 2017-01-27 – 2017-01-28 (×2): 5 mg via ORAL
  Filled 2017-01-27 (×2): qty 1

## 2017-01-27 NOTE — Care Management (Signed)
Admitted to this facility with the diagnosis of UTI under observation status. Discharged from this facility 12/28/16 following a AAA 12/27/16. Lives with wife, Remo Lipps [phone]). Last seen Dr. Doy Hutching 3 months ago. Home Health per [facility] 8-10 years ago. [place] following knee replacement several years ago. No home oxygen. Wheelchair and rolling walker in the home. No falls. Good appetite. Prescriptions are filled at [facility]. Takes care of all basic activities of daily living himself, drives. Wife will transport Shelbie Ammons RN MSN CCM Care Management

## 2017-01-27 NOTE — Progress Notes (Signed)
[place] [facility] [place] NAME: Rick Mcbride    MR#:  [id number]  DATE OF BIRTH:  1941-01-08  SUBJECTIVE:  CHIEF COMPLAINT:   Chief Complaint  Patient presents with  . Weakness  The patient is 76 year old Caucasian male with past medical history significant for the history of recent abdominal aneurysm repair, chronic kidney disease stage III, coronary artery disease, status post bypass surgery, degenerative disease with chronic back pain, gastroesophageal reflux disease, prostate cancer, osteoarthritis, who presented to the hospital with complaints of weakness, difficulty ambulating. He was unable even to get up from his chair today and therefore was brought to emergency room for further evaluation and treatment. He was noted to have acute on chronic renal injury and urinary tract infection and admitted to the hospital. He feels much more improved today after IV fluid administration. His kidney function improved as well. Urine culture revealed more than 100,000 colony-forming units of Escherichia coli  Review of Systems  Constitutional: Negative for chills, fever and weight loss.  HENT: Negative for congestion.   Eyes: Negative for blurred vision and double vision.  Respiratory: Negative for cough, sputum production, shortness of breath and wheezing.   Cardiovascular: Negative for chest pain, palpitations, orthopnea, leg swelling and PND.  Gastrointestinal: Negative for abdominal pain, blood in stool, constipation, diarrhea, nausea and vomiting.  Genitourinary: Negative for dysuria, frequency, hematuria and urgency.  Musculoskeletal: Positive for back pain. Negative for falls.  Neurological: Negative for dizziness, tremors, focal weakness and headaches.  Endo/Heme/Allergies: Does not bruise/bleed easily.  Psychiatric/Behavioral: Negative for depression. The patient does not have insomnia.     VITAL SIGNS: Blood pressure 135/71, pulse  72, temperature 98.5 F (36.9 C), temperature source Oral, resp. rate 16, height '5\' 11"'$  (1.803 m), weight 105.2 kg (232 lb), SpO2 100 %.  PHYSICAL EXAMINATION:   GENERAL:  76 y.o.-year-old patient lying in the bed with no acute distress.  EYES: Pupils equal, round, reactive to light and accommodation. No scleral icterus. Extraocular muscles intact.  HEENT: Head atraumatic, normocephalic. Oropharynx and nasopharynx clear.  NECK:  Supple, no jugular venous distention. No thyroid enlargement, no tenderness.  LUNGS: Normal breath sounds bilaterally, no wheezing, rales,rhonchi or crepitation. No use of accessory muscles of respiration.  CARDIOVASCULAR: S1, S2 normal. No murmurs, rubs, or gallops.  ABDOMEN: Soft, nontender, nondistended. Bowel sounds present. No organomegaly or mass.  EXTREMITIES: No pedal edema, cyanosis, or clubbing.  NEUROLOGIC: Cranial nerves II through XII are intact. Muscle strength 5/5 in all extremities. Sensation intact. Gait not checked.  PSYCHIATRIC: The patient is alert and oriented x 3.  SKIN: No obvious rash, lesion, or ulcer.   ORDERS/RESULTS REVIEWED:   CBC  Recent Labs Lab 01/26/17 1012 01/27/17 0601  WBC 9.2 4.6  HGB 12.6* 10.2*  HCT 36.9* 30.6*  PLT 171 134*  MCV 87.1 87.9  MCH 29.7 29.3  MCHC 34.1 33.3  RDW 14.7* 15.0*   ------------------------------------------------------------------------------------------------------------------  Chemistries   Recent Labs Lab 01/26/17 1012 01/27/17 0601  NA 136 137  K 4.3 4.2  CL 107 108  CO2 20* 23  GLUCOSE 153* 92  BUN 43* 34*  CREATININE 2.82* 2.41*  CALCIUM 9.3 8.5*   ------------------------------------------------------------------------------------------------------------------ estimated creatinine clearance is 32.2 mL/min (A) (by C-G formula based on SCr of 2.41 mg/dL  (H)). ------------------------------------------------------------------------------------------------------------------ No results for input(s): TSH, T4TOTAL, T3FREE, THYROIDAB in the last 72 hours.  Invalid input(s): FREET3  Cardiac Enzymes  Recent Labs Lab 01/26/17 1012  TROPONINI 0.03*   ------------------------------------------------------------------------------------------------------------------ Invalid input(s): POCBNP ---------------------------------------------------------------------------------------------------------------  RADIOLOGY: Dg Chest 2 View  Result Date: 01/26/2017 CLINICAL DATA:  Lower extremity weakness.  Cardiac arrhythmia. EXAM: CHEST  2 VIEW COMPARISON:  October 06, 2016 FINDINGS: There is postoperative change on the right with volume loss the right base. There is scarring in the right base with chronic blunting of the right costophrenic angle. There is milder scarring in the left base. There is no edema or consolidation. Heart is upper normal in size with pulmonary vascularity within normal limits. Pacemaker leads are attached to the right atrium and right ventricle, unchanged. Patient is status post coronary artery bypass grafting. There is atherosclerotic calcification in the aorta. No adenopathy. There are total shoulder replacements bilaterally. IMPRESSION: Postoperative change with scarring and volume loss the right. Slight scarring left base. No edema or consolidation. Stable cardiac silhouette. There is aortic atherosclerosis. No change in pacemaker lead positioning. Electronically Signed   By: Lowella Grip III M.D.   On: 01/26/2017 10:57   US Renal  Result Date: 01/27/2017 CLINICAL DATA:  Renal failure. EXAM: RENAL / URINARY TRACT ULTRASOUND COMPLETE COMPARISON:  12/09/2016 FINDINGS: Right Kidney: Length: 12.6 cm. Echogenicity within normal limits. No mass or hydronephrosis visualized. Left Kidney: Length: 12.0 cm. Echogenicity within normal  limits. No mass or hydronephrosis visualized. Upper pole cyst is noted measuring 5 cm in maximum dimension. No internal septation or mural nodularity. Bladder: Appears normal for degree of bladder distention. IMPRESSION: 1. No hydronephrosis. 2. Left kidney cyst Electronically Signed   By: Kerby Moors M.D.   On: 01/27/2017 09:38    EKG:  Orders placed or performed during the hospital encounter of 01/26/17  . ED EKG  . ED EKG    ASSESSMENT AND PLAN:  Active Problems:   UTI (urinary tract infection)  #1. Urinary tract infection due to Escherichia coli, sensitivities are pending, continue Rocephin, awaiting for sensitivities, likely discharge home on Keflex tomorrow in the morning #2. Acute on chronic renal failure, improving with IV fluids, follow closely, nephrology consultation was obtained, discussed with nephrologist. Holding ACE inhibitor #3. Elevated troponin, likely demand ischemia, getting an echo #4. Generalized weakness, physical therapist, consultation is obtained, no PT follow-up was recommended Management plans discussed with the patient, family and they are in agreement. #5. Essential hypertension, now on Norvasc, follow blood pressure readings and advanced medications as needed, holding ACE inhibitor  DRUG ALLERGIES: No Known Allergies  CODE STATUS:     Code Status Orders        Start     Ordered   01/26/17 1732  Full code  Continuous     01/26/17 1731    Code Status History    Date Active Date Inactive Code Status Order ID Comments User Context   01/05/2017 11:11 AM 01/06/2017  3:06 PM Full Code [id number]  Algernon Huxley, MD Inpatient   12/27/2016  9:48 AM 12/27/2016  2:52 PM Full Code [id number]  Algernon Huxley, MD Inpatient   07/10/2015  5:08 PM 07/11/2015  1:18 PM Full Code [id number]  [name], MD Inpatient    Advance Directive Documentation     Most Recent Value  Type of Advance Directive  Healthcare Power of [place], Living will  Pre-existing out of facility DNR  order (yellow form or pink MOST form)  -  "MOST" Form in Place?  -      TOTAL TIME TAKING CARE OF THIS PATIENT: 40 minutes.  Discussed with patient's wife, nephrologist,  Dr. Rolly Pancake M.D on 01/27/2017 at 3:55 PM  Between 7am to 6pm - Pager - [phone]  After 6pm go to [email or web address] - password [email or web address] [facility]  Office  [phone]  CC: Primary care physician; Idelle Crouch, MD

## 2017-01-27 NOTE — Evaluation (Signed)
Physical Therapy Evaluation Patient Details Name: Rick Mcbride. MRN: [id number] DOB: [birth year] Today's Date: 01/27/2017   History of Present Illness  Pt admitted for UTI. Pt with complaints of weakness with difficulty ambulating. Pt with history including AAA s/p repair, CKD, CAD s/p bypass, prostate cancer, and chronic back pain. Pt reports he recently had vascular Sx recently to improve blood flow and he attributes that to his UTI.  Clinical Impression  Pt is a pleasant 76 year old male who was admitted for UTI with complaints of weakness. Pt performs bed mobility with cga, transfers with independence, and ambulation with mod independence able to hold onto IV pole. Pt demonstrates all bed mobility/transfers/ambulation at baseline level. Pt does not require any further PT needs at this time. Pt will be dc in house and does not require follow up. RN aware. Will dc current orders.       Follow Up Recommendations No PT follow up    Equipment Recommendations  None recommended by PT    Recommendations for Other Services       Precautions / Restrictions Precautions Precautions: Fall Restrictions Weight Bearing Restrictions: No      Mobility  Bed Mobility Overal bed mobility: Needs Assistance Bed Mobility: Supine to Sit     Supine to sit: Min guard     General bed mobility comments: slight assist for trunk support while transitioning to EOB. Once seated, able to sit with upright posture  Transfers Overall transfer level: Independent Equipment used: None             General transfer comment: transfers performed with upright posture. Slight post leaning once standing, however easily corrected with cues  Ambulation/Gait Ambulation/Gait assistance: Modified independent (Device/Increase time) Ambulation Distance (Feet): 200 Feet Assistive device:  (IV pole) Gait Pattern/deviations: Step-through pattern     General Gait Details: Pt ambulated around RN station  with reciprocal gait pattern and WNL gait speed. Pt able to perform mod DGI to assess balance. No fatigue or SOB symptoms noted with ambulation  Stairs            Wheelchair Mobility    Modified Rankin (Stroke Patients Only)       Balance Overall balance assessment: Independent                               Standardized Balance Assessment Standardized Balance Assessment : Dynamic Gait Index   Dynamic Gait Index Level Surface: Normal Change in Gait Speed: Normal Gait with Horizontal Head Turns: Normal Gait with Vertical Head Turns: Normal       Pertinent Vitals/Pain Pain Assessment: No/denies pain    Home Living Family/patient expects to be discharged to:: Private residence Living Arrangements: Spouse/significant other Available Help at Discharge: Family Type of Home: House Home Access: Stairs to enter Entrance Stairs-Rails: None Technical brewer of Steps: 3 Home Layout: One level Home Equipment: Environmental consultant - 2 wheels      Prior Function Level of Independence: Independent         Comments: has RW if needed, however has not recently needed RW for mobility     Hand Dominance        Extremity/Trunk Assessment   Upper Extremity Assessment Upper Extremity Assessment: Overall WFL for tasks assessed    Lower Extremity Assessment Lower Extremity Assessment: Overall WFL for tasks assessed       Communication   Communication: No difficulties  Cognition Arousal/Alertness: Awake/alert Behavior  During Therapy: WFL for tasks assessed/performed Overall Cognitive Status: Within Functional Limits for tasks assessed                      General Comments      Exercises     Assessment/Plan    PT Assessment Patent does not need any further PT services  PT Problem List         PT Treatment Interventions      PT Goals (Current goals can be found in the Care Plan section)  Acute Rehab PT Goals Patient Stated Goal: to go  home PT Goal Formulation: All assessment and education complete, DC therapy Time For Goal Achievement: 01/27/17 Potential to Achieve Goals: Good    Frequency     Barriers to discharge        Co-evaluation               End of Session Equipment Utilized During Treatment: Gait belt Activity Tolerance: Patient tolerated treatment well Patient left: in bed;with bed alarm set (With MD present) Nurse Communication: Mobility status PT Visit Diagnosis: Unsteadiness on feet (R26.81);Difficulty in walking, not elsewhere classified (R26.2)    Functional Assessment Tool Used: AM-PAC 6 Clicks Basic Mobility Functional Limitation: Mobility: Walking and moving around Mobility: Walking and Moving Around Current Status (Y0737): At least 1 percent but less than 20 percent impaired, limited or restricted Mobility: Walking and Moving Around Goal Status [phone]): At least 1 percent but less than 20 percent impaired, limited or restricted Mobility: Walking and Moving Around Discharge Status [phone]): At least 1 percent but less than 20 percent impaired, limited or restricted    Time: 6270-3500 PT Time Calculation (min) (ACUTE ONLY): 10 min   Charges:   PT Evaluation $PT Eval Low Complexity: 1 Procedure     PT G Codes:   PT G-Codes **NOT FOR INPATIENT CLASS** Functional Assessment Tool Used: AM-PAC 6 Clicks Basic Mobility Functional Limitation: Mobility: Walking and moving around Mobility: Walking and Moving Around Current Status (X3818): At least 1 percent but less than 20 percent impaired, limited or restricted Mobility: Walking and Moving Around Goal Status [phone]): At least 1 percent but less than 20 percent impaired, limited or restricted Mobility: Walking and Moving Around Discharge Status [phone]): At least 1 percent but less than 20 percent impaired, limited or restricted     Demara Lover 01/27/2017, 11:35 AM Greggory Stallion, PT, DPT [phone]

## 2017-01-27 NOTE — Progress Notes (Signed)
[facility]  ROUNDING NOTE   Subjective:   [name]. admitted to [facility] on 01/26/2017 for Dehydration [E86.0] Weakness [R53.1] Renal insufficiency [N28.9] Urinary tract infection without hematuria, site unspecified [N39.0]   Patient had AAA repair on 2/28 by Dr. Lucky Cowboy. Creatinine of 2.1 then. Admitted with 2.82, started on IV fluids and creatinine improved to 2.41.   Objective:  Vital signs in last 24 hours:  Temp:  [97.8 F (36.6 C)-98.5 F (36.9 C)] 98.5 F (36.9 C) (03/22 1217) Pulse Rate:  [71-105] 72 (03/22 1217) Resp:  [15-21] 16 (03/22 1217) BP: (118-170)/(71-85) 135/71 (03/22 1217) SpO2:  [94 %-100 %] 100 % (03/22 1217) Weight:  [105.2 kg (232 lb)] 105.2 kg (232 lb) (03/21 1734)  Weight change:  Filed Weights   01/26/17 1011 01/26/17 1734  Weight: 104.3 kg (230 lb) 105.2 kg (232 lb)    Intake/Output: I/O last 3 completed shifts: In: 692.5 [I.V.:642.5; IV Piggyback:50] Out: -    Intake/Output this shift:  Total I/O In: 1489.9 [P.O.:240; I.V.:1249.9] Out: 200 [Urine:200]  Physical Exam: General: NAD,   Head: Normocephalic, atraumatic. Moist oral mucosal membranes  Eyes: Anicteric, PERRL  Neck: Supple, trachea midline  Lungs:  Clear to auscultation  Heart: Regular rate and rhythm  Abdomen:  Soft, nontender,   Extremities: no peripheral edema.  Neurologic: Nonfocal, moving all four extremities  Skin: No lesions       Basic Metabolic Panel:  Recent Labs Lab 01/26/17 1012 01/27/17 0601  NA 136 137  K 4.3 4.2  CL 107 108  CO2 20* 23  GLUCOSE 153* 92  BUN 43* 34*  CREATININE 2.82* 2.41*  CALCIUM 9.3 8.5*    Liver Function Tests: No results for input(s): AST, ALT, ALKPHOS, BILITOT, PROT, ALBUMIN in the last 168 hours. No results for input(s): LIPASE, AMYLASE in the last 168 hours. No results for input(s): AMMONIA in the last 168 hours.  CBC:  Recent Labs Lab 01/26/17 1012 01/27/17 0601  WBC 9.2 4.6  HGB 12.6*  10.2*  HCT 36.9* 30.6*  MCV 87.1 87.9  PLT 171 134*    Cardiac Enzymes:  Recent Labs Lab 01/26/17 1012  TROPONINI 0.03*    BNP: Invalid input(s): POCBNP  CBG: No results for input(s): GLUCAP in the last 168 hours.  Microbiology: Results for orders placed or performed during the hospital encounter of 01/05/17  MRSA PCR Screening     Status: None   Collection Time: 01/05/17 11:36 AM  Result Value Ref Range Status   MRSA by PCR NEGATIVE NEGATIVE Final    Comment:        The GeneXpert MRSA Assay (FDA approved for NASAL specimens only), is one component of a comprehensive MRSA colonization surveillance program. It is not intended to diagnose MRSA infection nor to guide or monitor treatment for MRSA infections.     Coagulation Studies: No results for input(s): LABPROT, INR in the last 72 hours.  Urinalysis:  Recent Labs  01/26/17 1303  COLORURINE YELLOW*  LABSPEC 1.025  PHURINE 5.0  GLUCOSEU NEGATIVE  HGBUR NEGATIVE  BILIRUBINUR NEGATIVE  KETONESUR NEGATIVE  PROTEINUR 100*  NITRITE NEGATIVE  LEUKOCYTESUR LARGE*      Imaging: Dg Chest 2 View  Result Date: 01/26/2017 CLINICAL DATA:  Lower extremity weakness.  Cardiac arrhythmia. EXAM: CHEST  2 VIEW COMPARISON:  October 06, 2016 FINDINGS: There is postoperative change on the right with volume loss the right base. There is scarring in the right base with chronic blunting  of the right costophrenic angle. There is milder scarring in the left base. There is no edema or consolidation. Heart is upper normal in size with pulmonary vascularity within normal limits. Pacemaker leads are attached to the right atrium and right ventricle, unchanged. Patient is status post coronary artery bypass grafting. There is atherosclerotic calcification in the aorta. No adenopathy. There are total shoulder replacements bilaterally. IMPRESSION: Postoperative change with scarring and volume loss the right. Slight scarring left base. No  edema or consolidation. Stable cardiac silhouette. There is aortic atherosclerosis. No change in pacemaker lead positioning. Electronically Signed   By: Lowella Grip III M.D.   On: 01/26/2017 10:57   US Renal  Result Date: 01/27/2017 CLINICAL DATA:  Renal failure. EXAM: RENAL / URINARY TRACT ULTRASOUND COMPLETE COMPARISON:  12/09/2016 FINDINGS: Right Kidney: Length: 12.6 cm. Echogenicity within normal limits. No mass or hydronephrosis visualized. Left Kidney: Length: 12.0 cm. Echogenicity within normal limits. No mass or hydronephrosis visualized. Upper pole cyst is noted measuring 5 cm in maximum dimension. No internal septation or mural nodularity. Bladder: Appears normal for degree of bladder distention. IMPRESSION: 1. No hydronephrosis. 2. Left kidney cyst Electronically Signed   By: Kerby Moors M.D.   On: 01/27/2017 09:38     Medications:    . amLODipine  5 mg Oral Daily  . aspirin EC  81 mg Oral Daily  . atorvastatin  40 mg Oral q1800  . cefTRIAXone (ROCEPHIN) IVPB 1 gram/50 mL D5W  1 g Intravenous Q24H  . clopidogrel  75 mg Oral Daily  . cyclobenzaprine  10 mg Oral QHS  . docusate sodium  100 mg Oral Daily  . donepezil  5 mg Oral QHS  . heparin  5,000 Units Subcutaneous Q8H  . loratadine  10 mg Oral Daily  . multivitamin with minerals  1 tablet Oral Daily  . multivitamin-lutein  1 capsule Oral BID  . niacin  500 mg Oral Daily  . pantoprazole  40 mg Oral Daily  . sodium chloride  1,000 mL Intravenous Once  . zolpidem  5 mg Oral QHS   acetaminophen **OR** acetaminophen, carisoprodol, ondansetron **OR** ondansetron (ZOFRAN) IV, oxyCODONE-acetaminophen  Assessment/ Plan:  Mr. Aedyn Kempfer Axton Cihlar. is a 76 y.o. white male with hypertension, hyperlipidemia, benign prostatic hypertrophy, prostate cancer status post resection, coronary artery disease status post CABG, abdominal aortic aneurysm, osteoarthitis, CVA and meralgia parestheitca    1.Acute Renal Failrue on Chronic  Kidney Disease stage III with proteinuria: Chronic kidney disease secondary to long time use of nabumetone, a NSAID and vascular disease Acute renal failure secondary to recent surgery and prerenal azotemia.  Baseline creatinine of 2.1, GFR of 29 01/06/17 - holding lisinopril - Encourage PO intake  2. Hypertension: well controlled   - amlodipine  3. Urinary Tract Infection: empiric ceftriaxone.    LOS: 0 Altha Sweitzer 3/22/20181:35 PM

## 2017-01-28 ENCOUNTER — Observation Stay: Admit: 2017-01-28 | Payer: PPO

## 2017-01-28 ENCOUNTER — Encounter (INDEPENDENT_AMBULATORY_CARE_PROVIDER_SITE_OTHER): Payer: PPO | Admitting: Vascular Surgery

## 2017-01-28 DIAGNOSIS — R7989 Other specified abnormal findings of blood chemistry: Secondary | ICD-10-CM

## 2017-01-28 DIAGNOSIS — N189 Chronic kidney disease, unspecified: Secondary | ICD-10-CM

## 2017-01-28 DIAGNOSIS — N390 Urinary tract infection, site not specified: Secondary | ICD-10-CM | POA: Diagnosis not present

## 2017-01-28 DIAGNOSIS — R748 Abnormal levels of other serum enzymes: Secondary | ICD-10-CM | POA: Diagnosis not present

## 2017-01-28 DIAGNOSIS — R531 Weakness: Secondary | ICD-10-CM | POA: Diagnosis not present

## 2017-01-28 DIAGNOSIS — R778 Other specified abnormalities of plasma proteins: Secondary | ICD-10-CM

## 2017-01-28 DIAGNOSIS — A498 Other bacterial infections of unspecified site: Secondary | ICD-10-CM

## 2017-01-28 DIAGNOSIS — N179 Acute kidney failure, unspecified: Secondary | ICD-10-CM | POA: Diagnosis not present

## 2017-01-28 DIAGNOSIS — I10 Essential (primary) hypertension: Secondary | ICD-10-CM

## 2017-01-28 LAB — CBC
HCT: 33.5 % — ABNORMAL LOW (ref 40.0–52.0)
HEMOGLOBIN: 11.5 g/dL — AB (ref 13.0–18.0)
MCH: 29.8 pg (ref 26.0–34.0)
MCHC: 34.2 g/dL (ref 32.0–36.0)
MCV: 87.1 fL (ref 80.0–100.0)
Platelets: 147 10*3/uL — ABNORMAL LOW (ref 150–440)
RBC: 3.84 MIL/uL — ABNORMAL LOW (ref 4.40–5.90)
RDW: 14.6 % — ABNORMAL HIGH (ref 11.5–14.5)
WBC: 4.3 10*3/uL (ref 3.8–10.6)

## 2017-01-28 LAB — URINE CULTURE

## 2017-01-28 LAB — CREATININE, SERUM
Creatinine, Ser: 2.01 mg/dL — ABNORMAL HIGH (ref 0.61–1.24)
GFR calc Af Amer: 35 mL/min — ABNORMAL LOW (ref >60)
GFR, EST NON AFRICAN AMERICAN: 31 mL/min — AB (ref >60)

## 2017-01-28 MED ORDER — OXYCODONE-ACETAMINOPHEN 5-325 MG PO TABS: 1.0000 | ORAL_TABLET | ORAL | 0 refills | Status: DC | PRN

## 2017-01-28 MED ORDER — AMLODIPINE BESYLATE 5 MG PO TABS: 5.0000 mg | ORAL_TABLET | Freq: Every day | ORAL | 0 refills | Status: DC

## 2017-01-28 MED ORDER — CEPHALEXIN 500 MG PO CAPS: 500.0000 mg | ORAL_CAPSULE | Freq: Two times a day (BID) | ORAL | 0 refills | Status: DC

## 2017-01-28 NOTE — Progress Notes (Signed)
MD order received to discharge pt home today; verbally reviewed AVS with pt and pt's spouse; gave Rx to pt; no questions voiced at this time; pt discharged via wheelchair by auxillary to the visitor's entrance

## 2017-01-28 NOTE — Discharge Summary (Signed)
[facility] [place] NAME: Rick Mcbride    MR#:  [id number]  DATE OF BIRTH:  [birth year]  DATE OF ADMISSION:  01/26/2017 ADMITTING PHYSICIAN: Henreitta Leber, MD  DATE OF DISCHARGE: 01/28/2017 10:30 AM  PRIMARY CARE PHYSICIAN: SPARKS,JEFFREY D, MD     ADMISSION DIAGNOSIS:  Dehydration [E86.0] Weakness [R53.1] Renal insufficiency [N28.9] Urinary tract infection without hematuria, site unspecified [N39.0]  DISCHARGE DIAGNOSIS:  Principal Problem:   UTI (urinary tract infection) Active Problems:   Acute on chronic renal failure (HCC)   Escherichia coli (E. coli) infection   Elevated troponin   Generalized weakness   Essential hypertension   SECONDARY DIAGNOSIS:   Past Medical History:  Diagnosis Date  . AAA (abdominal aortic aneurysm) ([place])   . AAA (abdominal aortic aneurysm) without rupture ([place])   . Anemia   . Aneurysm ([place])    abd aortic  . Anxiety   . Atrophic kidney   . Cervical radiculopathy   . Chronic airway obstruction ([place])   . Chronic kidney disease (CKD), stage III (moderate)    followed by Dr. Johnny Bridge  . Chronic tension headaches   . Coronary artery disease   . Coronary atherosclerosis of autologous vein bypass graft   . DDD (degenerative disc disease), lumbar   . Degenerative disc disease, lumbar    with lumbar radiculopathy  . Elbow fracture, left   . GERD (gastroesophageal reflux disease)   . H/O adenomatous polyp of colon   . H/O hemorrhoids   . H/O urticaria   . Headache   . Heart disease   . Hypercholesteremia   . Hyperlipidemia   . 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 Lucky Cowboy  . Lung cancer ([place])   . Lung cancer ([place])   . Meralgia paresthetica   . Meralgia paresthetica   . Neuralgia   . Osteoarthritis   . Osteoarthritis    s/p L knee surgery  . 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 Presence of  permanent cardiac pacemaker   . Prostate cancer ([place])   . Second degree AV block    Followed by Dr. Nehemiah Massed  . Sinoatrial node dysfunction (HCC)   . Stroke ([place])   . TIA (transient ischemic attack)     .pro HOSPITAL COURSE:   The patient is 76 year old Caucasian male with past medical history significant for the history of recent abdominal aneurysm repair, chronic kidney disease stage III, coronary artery disease, status post bypass surgery, degenerative disease with chronic back pain, gastroesophageal reflux disease, prostate cancer, osteoarthritis, who presented to the hospital with complaints of weakness, difficulty ambulating. He was unable even to get up from his chair and therefore was brought to emergency room for further evaluation and treatment. He was noted to have acute on chronic renal injury and urinary tract infection and admitted to the hospital. He was initiated on Rocephin and IV fluids, improved clinically.  Urine culture revealed more than 100,000 colony-forming units of Escherichia coli, pansensitive. He was evaluated by physical therapist and recommended no physical therapist follow-up. He was seen by nephrologist, ACE inhibitor was stopped, patient was given IV fluids and his kidney function improved. He was recommended outpatient follow-up with primary care physician and nephrologist. Discussion by problem #1. Urinary tract infection due to Escherichia coli, pansensitive, discharge home on Keflex today to complete course #  2. Acute on chronic renal failure, improved with IV fluids, nephrology consultation is appreciated. Holding ACE inhibitor. Renal ultrasound was unremarkable. No hydronephrosis was noted.  #3. Elevated troponin, likely demand ischemia, may get an echo as outpatient, no chest pains were documented.  #4. Generalized weakness, physical therapist consultation was obtained, no PT follow-up was recommended #5. Essential hypertension, continue Norvasc, , holding  ACE inhibitor, may restart ACE inhibitor as per nephrology's recommendations #6. Recent abdominal aneurysm operation, patient is to make an appointment with Dr. Lucky Cowboy within next 1 week DISCHARGE CONDITIONS:   Stable  CONSULTS OBTAINED:  Treatment Team:  Lavonia Dana, MD  DRUG ALLERGIES:  No Known Allergies  DISCHARGE MEDICATIONS:   Discharge Medication List as of 01/28/2017 10:06 AM    START taking these medications   Details  amLODipine (NORVASC) 5 MG tablet Take 1 tablet (5 mg total) by mouth daily., Starting Sat 01/29/2017, Normal    cephALEXin (KEFLEX) 500 MG capsule Take 1 capsule (500 mg total) by mouth 2 (two) times daily., Starting Fri 01/28/2017, Normal      CONTINUE these medications which have CHANGED   Details  oxyCODONE-acetaminophen (PERCOCET/ROXICET) 5-325 MG tablet Take 1-2 tablets by mouth every 4 (four) hours as needed for moderate pain., Starting Fri 01/28/2017, Print      CONTINUE these medications which have NOT CHANGED   Details  aspirin EC 81 MG tablet Take 81 mg by mouth daily. , Until Discontinued, Historical Med    atorvastatin (LIPITOR) 40 MG tablet Take 40 mg by mouth daily at 6 PM. , Starting 10/30/2014, Until Discontinued, Historical Med    Calcium Carb-Cholecalciferol (CALCIUM 600 + D PO) Take 1 tablet by mouth daily., Until Discontinued, Historical Med    carisoprodol (SOMA) 350 MG tablet Take 350 mg by mouth 4 (four) times daily as needed for muscle spasms., Historical Med    cetirizine (ZYRTEC) 10 MG tablet Take 10 mg by mouth daily., Until Discontinued, Historical Med    clopidogrel (PLAVIX) 75 MG tablet Take 75 mg by mouth daily. , Starting 10/30/2014, Until Discontinued, Historical Med    clotrimazole-betamethasone (LOTRISONE) cream Apply 1 application topically 2 (two) times daily., Historical Med    cyclobenzaprine (FLEXERIL) 10 MG tablet Take 10 mg by mouth at bedtime. , Historical Med    docusate sodium (COLACE) 100 MG capsule Take  100 mg by mouth daily., Historical Med    donepezil (ARICEPT) 5 MG tablet Take 5 mg by mouth at bedtime., Until Discontinued, Historical Med    ferrous sulfate (SLOW FE) 160 (50 FE) MG TBCR SR tablet Take 1 tablet by mouth 2 (two) times daily. , Until Discontinued, Historical Med    Multiple Vitamins-Minerals (CENTRUM SILVER ULTRA MENS) TABS Take 1 tablet by mouth daily. , Until Discontinued, Historical Med    Multiple Vitamins-Minerals (PRESERVISION/LUTEIN) CAPS Take 1 capsule by mouth 2 (two) times daily. , Until Discontinued, Historical Med    niacin (NIASPAN) 500 MG CR tablet Take 500 mg by mouth daily. , Starting 10/30/2014, Until Discontinued, Historical Med    Omega-3 Fatty Acids (FISH OIL) 1200 MG CAPS Take 2 capsules by mouth daily. , Historical Med    omeprazole (PRILOSEC) 20 MG capsule Take 20 mg by mouth daily. , Starting 10/30/2014, Until Discontinued, Historical Med    traMADol-acetaminophen (ULTRACET) 37.5-325 MG per tablet 1 tablet every 6 (six) hours as needed for moderate pain or severe pain. , Starting 01/12/2015, Until Discontinued, Historical Med    vitamin B-12 (CYANOCOBALAMIN) 1000 MCG  tablet Take 1,000 mcg by mouth daily., Historical Med    zolpidem (AMBIEN) 10 MG tablet Take 10 mg by mouth at bedtime. , Starting 10/30/2014, Until Discontinued, Historical Med      STOP taking these medications     lisinopril (PRINIVIL,ZESTRIL) 5 MG tablet          DISCHARGE INSTRUCTIONS:    The patient is to follow-up with primary care physician as outpatient  If you experience worsening of your admission symptoms, develop shortness of breath, life threatening emergency, suicidal or homicidal thoughts you must seek medical attention immediately by calling 911 or calling your MD immediately  if symptoms less severe.  You Must read complete instructions/literature along with all the possible adverse reactions/side effects for all the Medicines you take and that have been  prescribed to you. Take any new Medicines after you have completely understood and accept all the possible adverse reactions/side effects.   Please note  You were cared for by a hospitalist during your hospital stay. If you have any questions about your discharge medications or the care you received while you were in the hospital after you are discharged, you can call the unit and asked to speak with the hospitalist on call if the hospitalist that took care of you is not available. Once you are discharged, your primary care physician will handle any further medical issues. Please note that NO REFILLS for any discharge medications will be authorized once you are discharged, as it is imperative that you return to your primary care physician (or establish a relationship with a primary care physician if you do not have one) for your aftercare needs so that they can reassess your need for medications and monitor your lab values.    Today   CHIEF COMPLAINT:   Chief Complaint  Patient presents with  . Weakness    HISTORY OF PRESENT ILLNESS:  Rick Mcbride  is a 76 y.o. male with a known history of recent abdominal aneurysm repair, chronic kidney disease stage III, coronary artery disease, status post bypass surgery, degenerative disease with chronic back pain, gastroesophageal reflux disease, prostate cancer, osteoarthritis, who presented to the hospital with complaints of weakness, difficulty ambulating. He was unable even to get up from his chair and therefore was brought to emergency room for further evaluation and treatment. He was noted to have acute on chronic renal injury and urinary tract infection and admitted to the hospital. He was initiated on Rocephin and IV fluids, improved clinically.  Urine culture revealed more than 100,000 colony-forming units of Escherichia coli, pansensitive. He was evaluated by physical therapist and recommended no physical therapist follow-up. He was seen by  nephrologist, ACE inhibitor was stopped, patient was given IV fluids and his kidney function improved. He was recommended outpatient follow-up with primary care physician and nephrologist. Discussion by problem #1. Urinary tract infection due to Escherichia coli, pansensitive, discharge home on Keflex today to complete course #2. Acute on chronic renal failure, improved with IV fluids, nephrology consultation is appreciated. Holding ACE inhibitor. Renal ultrasound was unremarkable. No hydronephrosis was noted.  #3. Elevated troponin, likely demand ischemia, may get an echo as outpatient, no chest pains were documented.  #4. Generalized weakness, physical therapist consultation was obtained, no PT follow-up was recommended #5. Essential hypertension, continue Norvasc, , holding ACE inhibitor, may restart ACE inhibitor as per nephrology's recommendations #6. Recent abdominal aneurysm operation, patient is to make an appointment with Dr. Lucky Cowboy within next 1 week   VITAL SIGNS:  Blood pressure 134/69, pulse 76, temperature 98.1 F (36.7 C), temperature source Oral, resp. rate 19, height '5\' 11"'$  (1.803 m), weight 105.2 kg (232 lb), SpO2 98 %.  I/O:   Intake/Output Summary (Last 24 hours) at 01/28/17 1654 Last data filed at 01/28/17 1046  Gross per 24 hour  Intake              390 ml  Output                0 ml  Net              390 ml    PHYSICAL EXAMINATION:  GENERAL:  76 y.o.-year-old patient lying in the bed with no acute distress.  EYES: Pupils equal, round, reactive to light and accommodation. No scleral icterus. Extraocular muscles intact.  HEENT: Head atraumatic, normocephalic. Oropharynx and nasopharynx clear.  NECK:  Supple, no jugular venous distention. No thyroid enlargement, no tenderness.  LUNGS: Normal breath sounds bilaterally, no wheezing, rales,rhonchi or crepitation. No use of accessory muscles of respiration.  CARDIOVASCULAR: S1, S2 normal. No murmurs, rubs, or gallops.    ABDOMEN: Soft, non-tender, non-distended. Bowel sounds present. No organomegaly or mass.  EXTREMITIES: No pedal edema, cyanosis, or clubbing.  NEUROLOGIC: Cranial nerves II through XII are intact. Muscle strength 5/5 in all extremities. Sensation intact. Gait not checked.  PSYCHIATRIC: The patient is alert and oriented x 3.  SKIN: No obvious rash, lesion, or ulcer.   DATA REVIEW:   CBC  Recent Labs Lab 01/28/17 0406  WBC 4.3  HGB 11.5*  HCT 33.5*  PLT 147*    Chemistries   Recent Labs Lab 01/27/17 0601 01/28/17 0406  NA 137  --   K 4.2  --   CL 108  --   CO2 23  --   GLUCOSE 92  --   BUN 34*  --   CREATININE 2.41* 2.01*  CALCIUM 8.5*  --     Cardiac Enzymes  Recent Labs Lab 01/26/17 1012  TROPONINI 0.03*    Microbiology Results  Results for orders placed or performed during the hospital encounter of 01/26/17  Urine culture     Status: Abnormal   Collection Time: 01/26/17  1:03 PM  Result Value Ref Range Status   Specimen Description URINE, RANDOM  Final   Special Requests NONE  Final   Culture >=100,000 COLONIES/mL ESCHERICHIA COLI (A)  Final   Report Status 01/28/2017 FINAL  Final   Organism ID, Bacteria ESCHERICHIA COLI (A)  Final      Susceptibility   Escherichia coli - MIC*    AMPICILLIN <=2 SENSITIVE Sensitive     CEFAZOLIN <=4 SENSITIVE Sensitive     CEFTRIAXONE <=1 SENSITIVE Sensitive     CIPROFLOXACIN <=0.25 SENSITIVE Sensitive     GENTAMICIN <=1 SENSITIVE Sensitive     IMIPENEM <=0.25 SENSITIVE Sensitive     NITROFURANTOIN <=16 SENSITIVE Sensitive     TRIMETH/SULFA <=20 SENSITIVE Sensitive     AMPICILLIN/SULBACTAM <=2 SENSITIVE Sensitive     PIP/TAZO <=4 SENSITIVE Sensitive     Extended ESBL NEGATIVE Sensitive     * >=100,000 COLONIES/mL ESCHERICHIA COLI    RADIOLOGY:  US Renal  Result Date: 01/27/2017 CLINICAL DATA:  Renal failure. EXAM: RENAL / URINARY TRACT ULTRASOUND COMPLETE COMPARISON:  12/09/2016 FINDINGS: Right Kidney: Length:  12.6 cm. Echogenicity within normal limits. No mass or hydronephrosis visualized. Left Kidney: Length: 12.0 cm. Echogenicity within normal limits. No mass or hydronephrosis visualized. Upper pole cyst is  noted measuring 5 cm in maximum dimension. No internal septation or mural nodularity. Bladder: Appears normal for degree of bladder distention. IMPRESSION: 1. No hydronephrosis. 2. Left kidney cyst Electronically Signed   By: Kerby Moors M.D.   On: 01/27/2017 09:38    EKG:   Orders placed or performed during the hospital encounter of 01/26/17  . ED EKG  . ED EKG      Management plans discussed with the patient, family and they are in agreement.  CODE STATUS:  Code Status History    Date Active Date Inactive Code Status Order ID Comments User Context   01/26/2017  5:31 PM 01/28/2017  3:19 PM Full Code [id number]  Henreitta Leber, MD Inpatient   01/05/2017 11:11 AM 01/06/2017  3:06 PM Full Code [id number]  Algernon Huxley, MD Inpatient   12/27/2016  9:48 AM 12/27/2016  2:52 PM Full Code [id number]  Algernon Huxley, MD Inpatient   07/10/2015  5:08 PM 07/11/2015  1:18 PM Full Code [id number]  [name], MD Inpatient    Advance Directive Documentation     Most Recent Value  Type of Advance Directive  Healthcare Power of [place], Living will  Pre-existing out of facility DNR order (yellow form or pink MOST form)  -  "MOST" Form in Place?  -      TOTAL TIME TAKING CARE OF THIS PATIENT: 40 minutes.    Theodoro Grist M.D on 01/28/2017 at 4:54 PM  Between 7am to 6pm - Pager - [phone]  After 6pm go to [email or web address] - password [email or web address] [facility]  Office  [phone]  CC: Primary care physician; Idelle Crouch, MD

## 2017-01-28 NOTE — Plan of Care (Signed)
Problem: Education: Goal: Knowledge of [facility] Education information/materials will improve Outcome: Progressing Possible d/c home today, POC reviewed with pt and wife.   Problem: Pain Managment: Goal: General experience of comfort will improve Outcome: Progressing Pt with c/o chronic back pain, percocet available prn.  Problem: Activity: Goal: Risk for activity intolerance will decrease Outcome: Progressing Pt independent, gait steady.

## 2017-01-28 NOTE — Care Management (Signed)
Discharge to home today per Dr. Ether Griffins. No follow-up needs identified. Wife will transport Rick Ammons RN MSN CCM Care Management

## 2017-01-31 MED FILL — Dextrose Inj 5%: INTRAVENOUS | Qty: 50 | Status: AC

## 2017-01-31 MED FILL — Ceftriaxone Sodium For Inj 10 GM: INTRAVENOUS | Qty: 1 | Status: AC

## 2017-02-01 ENCOUNTER — Encounter (INDEPENDENT_AMBULATORY_CARE_PROVIDER_SITE_OTHER): Payer: Self-pay | Admitting: Vascular Surgery

## 2017-02-01 ENCOUNTER — Ambulatory Visit (INDEPENDENT_AMBULATORY_CARE_PROVIDER_SITE_OTHER): Payer: PPO | Admitting: Vascular Surgery

## 2017-02-01 VITALS — BP 153/97 | HR 92 | Resp 17 | Ht 71.0 in | Wt 229.0 lb

## 2017-02-01 DIAGNOSIS — M545 Low back pain, unspecified: Secondary | ICD-10-CM | POA: Insufficient documentation

## 2017-02-01 DIAGNOSIS — I714 Abdominal aortic aneurysm, without rupture, unspecified: Secondary | ICD-10-CM

## 2017-02-01 DIAGNOSIS — G8929 Other chronic pain: Secondary | ICD-10-CM

## 2017-02-01 DIAGNOSIS — I10 Essential (primary) hypertension: Secondary | ICD-10-CM

## 2017-02-01 NOTE — Assessment & Plan Note (Signed)
He is now almost one month status post abdominal aortic aneurysm and iliac artery aneurysm repairs in an endovascular fashion. He is doing well. He would be cleared for any further surgery including back surgery 1 month after his surgery from me which would be next week. He will return in 3 months with a duplex for follow-up.

## 2017-02-01 NOTE — Assessment & Plan Note (Signed)
blood pressure control important in reducing the progression of atherosclerotic disease. On appropriate oral medications.  

## 2017-02-01 NOTE — Progress Notes (Signed)
Patient ID: Rick Mcbride., male   DOB: September 10, 1941, 76 y.o.   MRN: [id number]  Chief Complaint  Patient presents with  . Routine Post Op    AAA follow up    HPI Rick Mcbride. is a 76 y.o. male.  Patient returns almost 4 weeks status post endovascular abdominal aortic aneurysm repair. Did well initially and did not have any in-hospital problems. He did have a return admission for acute kidney injury with a UTI. The dye load may have contributed to his renal insufficiency, and having to dilate over a week's band certainly would put him at risk of that. His renal function improved and he was treated for his UTI successfully and he is back to his baseline. He is eager to have his back surgery.   Past Medical History:  Diagnosis Date  . AAA (abdominal aortic aneurysm) ([place])   . AAA (abdominal aortic aneurysm) without rupture ([place])   . Anemia   . Aneurysm ([place])    abd aortic  . Anxiety   . Atrophic kidney   . Cervical radiculopathy   . Chronic airway obstruction ([place])   . Chronic kidney disease (CKD), stage III (moderate)    followed by Dr. Johnny Mcbride  . Chronic tension headaches   . Coronary artery disease   . Coronary atherosclerosis of autologous vein bypass graft   . DDD (degenerative disc disease), lumbar   . Degenerative disc disease, lumbar    with lumbar radiculopathy  . Elbow fracture, left   . GERD (gastroesophageal reflux disease)   . H/O adenomatous polyp of colon   . H/O hemorrhoids   . H/O urticaria   . Headache   . Heart disease   . Hypercholesteremia   . Hyperlipidemia   . 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 Lucky Mcbride  . Lung cancer ([place])   . Lung cancer ([place])   . Meralgia paresthetica   . Meralgia paresthetica   . Neuralgia   . Osteoarthritis   . Osteoarthritis    s/p L knee surgery  . 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 Presence of permanent cardiac pacemaker   .  Prostate cancer ([place])   . Second degree AV block    Followed by Dr. Nehemiah Mcbride  . Sinoatrial node dysfunction (HCC)   . Stroke ([place])   . TIA (transient ischemic attack)     Past Surgical History:  Procedure Laterality Date  . CATARACT EXTRACTION    . COLONOSCOPY    . COLONOSCOPY    . COLONOSCOPY WITH PROPOFOL N/A 10/20/2015   Procedure: COLONOSCOPY WITH PROPOFOL;  Surgeon: Manya Silvas, MD;  Location[facility] ENDOSCOPY;  Service: Endoscopy;  Laterality: N/A;  . CORONARY ARTERY BYPASS GRAFT     triple  . coronary atherosclerosis of autologous vein bypass graft    . EMBOLIZATION Right 12/27/2016   Procedure: Embolization;  Surgeon: Algernon Huxley, MD;  Location: [facility] CV LAB;  Service: Cardiovascular;  Laterality: Right;  . ENDOVASCULAR REPAIR/STENT GRAFT N/A 01/05/2017   Procedure: Endovascular Repair/Stent Graft;  Surgeon: Algernon Huxley, MD;  Location: [facility] CV LAB;  Service: Cardiovascular;  Laterality: N/A;  . EYE SURGERY Bilateral    cataract extraction  . JOINT REPLACEMENT     shoulder and knees  . KNEE ARTHROSCOPY    . LOBECTOMY  01/31/13   RLL w/squamous  cell carcinoma lobectomy  . LUNG REMOVAL, PARTIAL  2014   right lower lobe  . PACEMAKER INSERTION    . PACEMAKER INSERTION  12/2012   Dual chanber pacemaker generator  . POLYPECTOMY    . PROSTATECTOMY    . TOTAL KNEE ARTHROPLASTY Bilateral   . TOTAL SHOULDER ARTHROPLASTY Left 07/10/2015   Procedure: TOTAL SHOULDER ARTHROPLASTY;  Surgeon: [name], MD;  Location: [facility] ORS;  Service: Orthopedics;  Laterality: Left;  . TOTAL SHOULDER REPLACEMENT        No Known Allergies  Current Outpatient Prescriptions  Medication Sig Dispense Refill  . amLODipine (NORVASC) 5 MG tablet Take 1 tablet (5 mg total) by mouth daily. 7 tablet 0  . aspirin EC 81 MG tablet Take 81 mg by mouth daily.     Marland Kitchen atorvastatin (LIPITOR) 40 MG tablet Take 40 mg by mouth daily at 6 PM.     . Calcium Carb-Cholecalciferol (CALCIUM 600 + D  PO) Take 1 tablet by mouth daily.    . carisoprodol (SOMA) 350 MG tablet Take 350 mg by mouth 4 (four) times daily as needed for muscle spasms.    . cephALEXin (KEFLEX) 500 MG capsule Take 1 capsule (500 mg total) by mouth 2 (two) times daily. 10 capsule 0  . cetirizine (ZYRTEC) 10 MG tablet Take 10 mg by mouth daily.    . clopidogrel (PLAVIX) 75 MG tablet Take 75 mg by mouth daily.     . clotrimazole-betamethasone (LOTRISONE) cream Apply 1 application topically 2 (two) times daily.    . cyclobenzaprine (FLEXERIL) 10 MG tablet Take 10 mg by mouth at bedtime.     . docusate sodium (COLACE) 100 MG capsule Take 100 mg by mouth daily.    Marland Kitchen donepezil (ARICEPT) 5 MG tablet Take 5 mg by mouth at bedtime.    . ferrous sulfate (SLOW FE) 160 (50 FE) MG TBCR SR tablet Take 1 tablet by mouth 2 (two) times daily.     Marland Kitchen lisinopril (PRINIVIL,ZESTRIL) 5 MG tablet     . Multiple Vitamins-Minerals (CENTRUM SILVER 50+MEN PO) Take by mouth.    . Multiple Vitamins-Minerals (CENTRUM SILVER ULTRA MENS) TABS Take 1 tablet by mouth daily.     . Multiple Vitamins-Minerals (PRESERVISION/LUTEIN) CAPS Take 1 capsule by mouth 2 (two) times daily.     . niacin (NIASPAN) 500 MG CR tablet Take 500 mg by mouth daily.     . Omega-3 Fatty Acids (FISH OIL) 1200 MG CAPS Take 2 capsules by mouth daily.     Marland Kitchen omeprazole (PRILOSEC) 20 MG capsule Take 20 mg by mouth daily.     Marland Kitchen oxyCODONE-acetaminophen (PERCOCET/ROXICET) 5-325 MG tablet Take 1-2 tablets by mouth every 4 (four) hours as needed for moderate pain. 15 tablet 0  . traMADol-acetaminophen (ULTRACET) 37.5-325 MG per tablet 1 tablet every 6 (six) hours as needed for moderate pain or severe pain.     . traZODone (DESYREL) 100 MG tablet Take by mouth.    . vitamin B-12 (CYANOCOBALAMIN) 1000 MCG tablet Take 1,000 mcg by mouth daily.    Marland Kitchen zolpidem (AMBIEN) 10 MG tablet Take 10 mg by mouth at bedtime.      No current facility-administered medications for this visit.          Physical Exam BP (!) 153/97 (BP Location: Right Arm)   Pulse 92   Resp 17   Ht '5\' 11"'$  (1.803 m)   Wt 229 lb (103.9 kg)   BMI 31.94 kg/m  Gen:  WD/WN, NAD Skin: incision C/D/I     Assessment/Plan:  Essential hypertension blood pressure control important in reducing the progression of atherosclerotic disease. On appropriate oral medications.   Low back pain He is now almost one month status post abdominal aortic aneurysm and iliac artery aneurysm repairs in an endovascular fashion. He is doing well. He would be cleared for any further surgery including back surgery 1 month after his surgery from me which would be next week. He will return in 3 months with a duplex for follow-up.  AAA (abdominal aortic aneurysm) without rupture [facility]) He is now almost one month status post abdominal aortic aneurysm and iliac artery aneurysm repairs in an endovascular fashion. He is doing well. He would be cleared for any further surgery including back surgery 1 month after his surgery from me which would be next week. He will return in 3 months with a duplex for follow-up.      [name] 02/01/2017, 11:16 AM   This note was created with Dragon medical transcription system.  Any errors from dictation are unintentional.

## 2017-02-02 ENCOUNTER — Telehealth (INDEPENDENT_AMBULATORY_CARE_PROVIDER_SITE_OTHER): Payer: Self-pay | Admitting: Vascular Surgery

## 2017-02-02 NOTE — Telephone Encounter (Signed)
Wife called to check on whether or not JD has sent the clearance for patient to have sgy. She states that JD told her he would send it yesterday and she does not understand why it has not been sent. I spoke to Mickel Baas and she had no knowledge of the patient or situation. I advised the patients wife that I would enter a message to have it researched. She states she would like to have it sent tomorrow. I informed her that JD is not in the office on Thursdays and that we are closed on Friday as it is a holiday for [place].

## 2017-02-07 NOTE — Telephone Encounter (Signed)
Patient's wife [name]) called again and left a message wanting to know if form has been filled out and faxed to Dr. Arnoldo Morale yet. [phone]

## 2017-02-08 ENCOUNTER — Other Ambulatory Visit: Payer: Self-pay | Admitting: Neurosurgery

## 2017-02-08 NOTE — Telephone Encounter (Signed)
All information was sent to the PCP office and patient notified.

## 2017-02-09 DIAGNOSIS — R8290 Unspecified abnormal findings in urine: Secondary | ICD-10-CM | POA: Diagnosis not present

## 2017-02-09 DIAGNOSIS — Z79899 Other long term (current) drug therapy: Secondary | ICD-10-CM | POA: Diagnosis not present

## 2017-02-15 DIAGNOSIS — H353132 Nonexudative age-related macular degeneration, bilateral, intermediate dry stage: Secondary | ICD-10-CM | POA: Diagnosis not present

## 2017-02-16 DIAGNOSIS — C3490 Malignant neoplasm of unspecified part of unspecified bronchus or lung: Secondary | ICD-10-CM | POA: Diagnosis not present

## 2017-02-16 DIAGNOSIS — N183 Chronic kidney disease, stage 3 (moderate): Secondary | ICD-10-CM | POA: Diagnosis not present

## 2017-02-16 DIAGNOSIS — I10 Essential (primary) hypertension: Secondary | ICD-10-CM | POA: Diagnosis not present

## 2017-02-16 DIAGNOSIS — Z79899 Other long term (current) drug therapy: Secondary | ICD-10-CM | POA: Diagnosis not present

## 2017-02-16 DIAGNOSIS — N390 Urinary tract infection, site not specified: Secondary | ICD-10-CM | POA: Diagnosis not present

## 2017-02-16 DIAGNOSIS — E782 Mixed hyperlipidemia: Secondary | ICD-10-CM | POA: Diagnosis not present

## 2017-02-23 ENCOUNTER — Encounter (INDEPENDENT_AMBULATORY_CARE_PROVIDER_SITE_OTHER): Payer: PPO | Admitting: Vascular Surgery

## 2017-02-23 ENCOUNTER — Other Ambulatory Visit (INDEPENDENT_AMBULATORY_CARE_PROVIDER_SITE_OTHER): Payer: PPO

## 2017-03-02 DIAGNOSIS — N183 Chronic kidney disease, stage 3 (moderate): Secondary | ICD-10-CM | POA: Diagnosis not present

## 2017-03-02 DIAGNOSIS — R809 Proteinuria, unspecified: Secondary | ICD-10-CM | POA: Diagnosis not present

## 2017-03-02 DIAGNOSIS — I129 Hypertensive chronic kidney disease with stage 1 through stage 4 chronic kidney disease, or unspecified chronic kidney disease: Secondary | ICD-10-CM | POA: Diagnosis not present

## 2017-03-02 DIAGNOSIS — N179 Acute kidney failure, unspecified: Secondary | ICD-10-CM | POA: Diagnosis not present

## 2017-03-02 DIAGNOSIS — N390 Urinary tract infection, site not specified: Secondary | ICD-10-CM | POA: Diagnosis not present

## 2017-03-02 DIAGNOSIS — Z79899 Other long term (current) drug therapy: Secondary | ICD-10-CM | POA: Diagnosis not present

## 2017-03-03 ENCOUNTER — Encounter (HOSPITAL_COMMUNITY): Payer: Self-pay

## 2017-03-03 ENCOUNTER — Encounter (HOSPITAL_COMMUNITY)
Admission: RE | Admit: 2017-03-03 | Discharge: 2017-03-03 | Disposition: A | Discharge status: Home / Self Care | Payer: PPO | Source: Ambulatory Visit | Attending: Neurosurgery | Admitting: Neurosurgery

## 2017-03-03 DIAGNOSIS — M5412 Radiculopathy, cervical region: Secondary | ICD-10-CM | POA: Insufficient documentation

## 2017-03-03 DIAGNOSIS — I10 Essential (primary) hypertension: Secondary | ICD-10-CM | POA: Diagnosis not present

## 2017-03-03 DIAGNOSIS — C3490 Malignant neoplasm of unspecified part of unspecified bronchus or lung: Secondary | ICD-10-CM | POA: Diagnosis not present

## 2017-03-03 DIAGNOSIS — N261 Atrophy of kidney (terminal): Secondary | ICD-10-CM | POA: Insufficient documentation

## 2017-03-03 DIAGNOSIS — K219 Gastro-esophageal reflux disease without esophagitis: Secondary | ICD-10-CM | POA: Diagnosis not present

## 2017-03-03 DIAGNOSIS — E7800 Pure hypercholesterolemia, unspecified: Secondary | ICD-10-CM | POA: Diagnosis not present

## 2017-03-03 DIAGNOSIS — D649 Anemia, unspecified: Secondary | ICD-10-CM | POA: Insufficient documentation

## 2017-03-03 DIAGNOSIS — I495 Sick sinus syndrome: Secondary | ICD-10-CM | POA: Insufficient documentation

## 2017-03-03 DIAGNOSIS — C61 Malignant neoplasm of prostate: Secondary | ICD-10-CM | POA: Insufficient documentation

## 2017-03-03 DIAGNOSIS — G939 Disorder of brain, unspecified: Secondary | ICD-10-CM | POA: Diagnosis not present

## 2017-03-03 DIAGNOSIS — M545 Low back pain: Secondary | ICD-10-CM | POA: Insufficient documentation

## 2017-03-03 DIAGNOSIS — Z01812 Encounter for preprocedural laboratory examination: Secondary | ICD-10-CM | POA: Diagnosis not present

## 2017-03-03 DIAGNOSIS — M4317 Spondylolisthesis, lumbosacral region: Secondary | ICD-10-CM | POA: Diagnosis not present

## 2017-03-03 DIAGNOSIS — Z87440 Personal history of urinary (tract) infections: Secondary | ICD-10-CM | POA: Diagnosis not present

## 2017-03-03 DIAGNOSIS — J449 Chronic obstructive pulmonary disease, unspecified: Secondary | ICD-10-CM | POA: Diagnosis not present

## 2017-03-03 DIAGNOSIS — N179 Acute kidney failure, unspecified: Secondary | ICD-10-CM | POA: Insufficient documentation

## 2017-03-03 DIAGNOSIS — M7530 Calcific tendinitis of unspecified shoulder: Secondary | ICD-10-CM | POA: Insufficient documentation

## 2017-03-03 DIAGNOSIS — M19019 Primary osteoarthritis, unspecified shoulder: Secondary | ICD-10-CM | POA: Insufficient documentation

## 2017-03-03 HISTORY — DX: Dyspnea, unspecified: R06.00

## 2017-03-03 HISTORY — DX: Acquired absence of lung (part of): Z90.2

## 2017-03-03 LAB — TYPE AND SCREEN
ABO/RH(D): O POS
ANTIBODY SCREEN: NEGATIVE

## 2017-03-03 LAB — SURGICAL PCR SCREEN
MRSA, PCR: NEGATIVE
Staphylococcus aureus: NEGATIVE

## 2017-03-03 LAB — ABO/RH: ABO/RH(D): O POS

## 2017-03-03 NOTE — Progress Notes (Addendum)
PCP - Fulton Reek  Cardiologist - Serafina Royals  Chest x-ray - 01/26/17 EKG - 01/26/17 Stress Test - 06/16/15 ECHO - 06/16/15 Cardiac Cath - 01/05/17   Faxed patient's cardiologist requesting records as well as Plavix and aspirin instructions.  Called Dr. Arnoldo Morale office because patient had a question regarding a brace fitting.  Patient was confused about needing a brace and fitting.  Spoke with Lexine Baton from Dr. Wynonia Sours I notified her that we did not have a clearance note from Dr. Nehemiah Massed and missing Aspirin and Plavix instructions.  She stated that she would fax over the information that she had from the patient's cardiologist office  Patient has a pacemaker devce     Patient denies shortness of breath, fever, cough and chest pain at PAT appointment   Patient verbalized understanding of instructions that was given to them at the PAT appointment. Patient expressed that there were no further questions.  Patient was also instructed that they will need to review over the PAT instructions again at home before the surgery.

## 2017-03-03 NOTE — Pre-Procedure Instructions (Signed)
Rick T Carrithers Jr.  03/03/2017      [facility] - [place], [place] - [address] [address] Phone: [phone] Fax: [phone]    Your procedure is scheduled on May 9th at 1:46  Report to [place] at 1145 A.M.  Call this number if you have problems the morning of surgery:  [phone]   Remember:  Do not eat food or drink liquids after midnight.   Take these medicines the morning of surgery with A SIP OF WATER amLODipine (NORVASC), Chlorpheniramine Maleate, omeprazole (PRILOSEC), oxyCODONE-acetaminophen (PERCOCET/ROXICET) if needed,   7 days prior to surgery STOP taking any Aspirin, Aleve, Naproxen, Ibuprofen, Motrin, Advil, Goody's, BC's, all herbal medications, fish oil, and all vitamins  FOLLOW INSTRUCTIONS FROM YOUR PHYSICIAN ABOUT PLAVIX   Do not wear jewelry.  Do not wear lotions, powders, or cologne, or deoderant.   Men may shave face and neck.  Do not bring valuables to the hospital.  [facility] is not responsible for any belongings or valuables.  Contacts, dentures or bridgework may not be worn into surgery.  Leave your suitcase in the car.  After surgery it may be brought to your room.  For patients admitted to the hospital, discharge time will be determined by your treatment team.  Patients discharged the day of surgery will not be allowed to drive home.    Special instructions:   [facility]- Preparing For Surgery  Before surgery, you can play an important role. Because skin is not sterile, your skin needs to be as free of germs as possible. You can reduce the number of germs on your skin by washing with CHG (chlorahexidine gluconate) Soap before surgery.  CHG is an antiseptic cleaner which kills germs and bonds with the skin to continue killing germs even after washing.  Please do not use if you have an allergy to CHG or antibacterial soaps. If your skin becomes reddened/irritated stop  using the CHG.  Do not shave (including legs and underarms) for at least 48 hours prior to first CHG shower. It is OK to shave your face.  Please follow these instructions carefully.   1. Shower the NIGHT BEFORE SURGERY and the MORNING OF SURGERY with CHG.   2. If you chose to wash your hair, wash your hair first as usual with your normal shampoo.  3. After you shampoo, rinse your hair and body thoroughly to remove the shampoo.  4. Use CHG as you would any other liquid soap. You can apply CHG directly to the skin and wash gently with a scrungie or a clean washcloth.   5. Apply the CHG Soap to your body ONLY FROM THE NECK DOWN.  Do not use on open wounds or open sores. Avoid contact with your eyes, ears, mouth and genitals (private parts). Wash genitals (private parts) with your normal soap.  6. Wash thoroughly, paying special attention to the area where your surgery will be performed.  7. Thoroughly rinse your body with warm water from the neck down.  8. DO NOT shower/wash with your normal soap after using and rinsing off the CHG Soap.  9. Pat yourself dry with a CLEAN TOWEL.   10. Wear CLEAN PAJAMAS   11. Place CLEAN SHEETS on your bed the night of your first shower and DO NOT SLEEP WITH PETS.    Day of Surgery: Do not apply any deodorants/lotions. Please wear clean clothes to the hospital/surgery  center.      Please read over the following fact sheets that you were given.

## 2017-03-03 NOTE — Progress Notes (Signed)
Rick Mcbride from [employer] notified about upcoming surgery with date, arrival time, and surgery time

## 2017-03-04 DIAGNOSIS — G894 Chronic pain syndrome: Secondary | ICD-10-CM | POA: Diagnosis not present

## 2017-03-04 DIAGNOSIS — N183 Chronic kidney disease, stage 3 (moderate): Secondary | ICD-10-CM | POA: Diagnosis not present

## 2017-03-04 DIAGNOSIS — I10 Essential (primary) hypertension: Secondary | ICD-10-CM | POA: Diagnosis not present

## 2017-03-07 NOTE — Progress Notes (Addendum)
Anesthesia Chart Review: Patient is a 76 year old male scheduled for L5-S1 PLIF on 03/16/17 by Dr. Arnoldo Morale.  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 Nestor Lewandowsky), dyspnea, chronic tension headaches, bilateral TKA '08, right shoulder hemiarthroplasty 07/29/11, left total shoulder arthroplasty 07/10/15. Hospitalization 01/26/17-01/28/17 for acute on chronic kidney disease in the setting of E.coli UTI and dehydration and recent EVAR AAA. Troponin minimally elevated at 0.03, felt likely demand ischemia, consider out-patient echo. No chest pain. (It looks like Cr trends were ~ 1.6-1.9 prior to EVAR, and was 2.1 after EVAR and peaked at 2.82 during 01/26/17 hospitalization.)  - PCP is Dr. Fulton Reek, last visit 03/04/17 for follow-up HTN and CKD3. Lisinopril had been discontinued, amlodipine resumed. He had labs repeated on 03/02/17 which showed Cr 2.2, slightly down from 2.3. He felt patient was okay to proceed with surgery. Patient denied CP and SOB. - Vascular surgeon is Dr. [name], last visit 02/01/17. He cleared patient for back surgery from a vascular standpoint. - Cardiologist is Dr. Serafina Royals, last visit 12/24/16 for follow-up and pre-operative evaluation prior to EVAR AAA which was done on 01/05/17. He also signed a letter of cardiac clearance for this procedure. He did not specifically address ASA/Plavix instructions, but for shoulder surgery in 2016 he recommended holding Plavix for 7 days and ASA for 3 days prior to surgery and for EVAR in 2018 holding Plavix for 3 days. Nicki to follow-up with Dr. Alveria Apley office and Dr. Arnoldo Morale clarify. (Update: Per Nicki at Dr. Arnoldo Morale' office, patient to  hold ASA 7 days and Plavix 5 days prior to surgery. She has notified patient.) He had normal PPM function as of 01/04/17. - Nephrologist is Dr. Lavonia Dana. - Neurologist Dr. Gurney Maxin.  Meds include amlodipine, aspirin 81 mg, Lipitor, Plavix, Aricept, ferrous sulfate, fish oil, Prilosec, Percocet, Ultracet, Ambien.  BP 133/62   Pulse 77   Temp 36.9 C   Resp 20   Ht '5\' 11"'$  (1.803 m)   Wt 232 lb (105.2 kg)   SpO2 100%   BMI 32.36 kg/m   EKG 01/26/17: SR with first degree AV block. ST/T wave abnormality, consider inferior ischemia, ST/T wave abnormality, consider anterolateral ischemia. Interpreting phcardiologist Dr. Rockey Situ noted changes from prior tracing. (He had negative T wave in anterolateral and inferior leads on 01/26/17, inferolateral on 12/23/16, in V3-4 on 12/24/16 tracing from [facility], and more non-specific anterolateral and inferior T wave inversion on 11/04/14 tracing.)  Echo 06/26/15 ([facility]; Care Everywhere): INTERPRETATION NORMAL LEFT VENTRICULAR SYSTOLIC FUNCTION WITH AN ESTIMATED EF = 40 % NORMAL RIGHT VENTRICULAR SYSTOLIC FUNCTION MODERATE TRICUSPID AND MITRAL VALVE INSUFFICIENCY NO VALVULAR STENOSIS MILD BIATRIAL ENLARGEMENT MILD RV ENLARGEMENT PACER WIRE NOTED  Exercise Stress Test 06/26/15 [facility]): Conclusions: METS 10.4. MHR 153, which represents 104% of the maximal, age predicted HR. Indeterminate treadmill ECG due to abnormal baseline ECG changes.  (Dr. Nehemiah Massed wrote, "...patient has had a recent treadmill EKG with good exercise tolerance and no evidence of chest discomfort or heart failure type symptoms at peak stress. At the patient did have an indeterminate EKG due to baseline EKG changes but no current anginal symptoms." He cleared patient for shoulder surgery that was done on 9/0/16.)  Carotid U/S  11/05/14 ([facility]; as outlined by neurologist Dr. Melrose Nakayama 11/21/14): IMPRESSION: Less than 50% stenosis in the right and left internal carotid  arteries.   Renal U/S 01/27/17: IMPRESSION: 1. No hydronephrosis. 2. Left kidney cyst.  CXR 01/26/17: IMPRESSION: Postoperative change with scarring and volume loss the right. Slight scarring left base. No edema or consolidation. Stable cardiac silhouette. There is aortic atherosclerosis. No change in pacemaker lead positioning.  Labs from 03/02/17 noted in [facility]). WBC 6.4, H/H 12.6/38.6. Glucose 110. Sodium 142. Potassium 4.8. BUN/Cr 25, creatinine 2.2. AST 15, ALT 15. A1c 5.9 on 11/03/16. T&S done at PAT.   I reviewed available information with anesthesiologist Dr. Kalman Shan yesterday including history, recent hospitalizations, multiple EKGs, and recent physician visits. Patient has clearance notes/letters from cardiology, vascular surgery, and IM. He has not been re-evaluated by cardiology since his EVAR or acute on chronic KD hospitalizations, but was seen by his PCP on 03/04/17, and Dr. Doy Hutching felt patient was okay to proceed. If no acute changes then it is anticipated that he can proceed as planned. I did order a repeat EKG on the day of surgery, since last tracing tracing was done in the setting of acute illness. (Perioperative PPM programming RX form re-faxed to [facility]. I've asked nursing staff to follow-up.)  George Hugh [facility] Short Stay Center/Anesthesiology Phone [phone] 03/08/2017 3:13 PM

## 2017-03-16 ENCOUNTER — Inpatient Hospital Stay (HOSPITAL_COMMUNITY)
Admission: RE | Admit: 2017-03-16 | Discharge: 2017-03-19 | DRG: 460 | Disposition: A | Discharge status: Home / Self Care | Payer: PPO | Source: Ambulatory Visit | Attending: Neurosurgery | Admitting: Neurosurgery

## 2017-03-16 ENCOUNTER — Inpatient Hospital Stay (HOSPITAL_COMMUNITY): Payer: PPO

## 2017-03-16 ENCOUNTER — Inpatient Hospital Stay (HOSPITAL_COMMUNITY): Payer: PPO | Admitting: Emergency Medicine

## 2017-03-16 ENCOUNTER — Encounter (HOSPITAL_COMMUNITY): Payer: Self-pay | Admitting: *Deleted

## 2017-03-16 ENCOUNTER — Inpatient Hospital Stay (HOSPITAL_COMMUNITY)
Admission: RE | Disposition: A | Discharge status: Home / Self Care | Payer: Self-pay | Source: Ambulatory Visit | Attending: Neurosurgery

## 2017-03-16 ENCOUNTER — Inpatient Hospital Stay (HOSPITAL_COMMUNITY): Payer: PPO | Admitting: Vascular Surgery

## 2017-03-16 DIAGNOSIS — Z959 Presence of cardiac and vascular implant and graft, unspecified: Secondary | ICD-10-CM

## 2017-03-16 DIAGNOSIS — K08109 Complete loss of teeth, unspecified cause, unspecified class: Secondary | ICD-10-CM | POA: Diagnosis present

## 2017-03-16 DIAGNOSIS — E669 Obesity, unspecified: Secondary | ICD-10-CM | POA: Diagnosis not present

## 2017-03-16 DIAGNOSIS — E7800 Pure hypercholesterolemia, unspecified: Secondary | ICD-10-CM | POA: Diagnosis present

## 2017-03-16 DIAGNOSIS — Z87891 Personal history of nicotine dependence: Secondary | ICD-10-CM | POA: Diagnosis not present

## 2017-03-16 DIAGNOSIS — M4317 Spondylolisthesis, lumbosacral region: Secondary | ICD-10-CM | POA: Diagnosis present

## 2017-03-16 DIAGNOSIS — Z9841 Cataract extraction status, right eye: Secondary | ICD-10-CM

## 2017-03-16 DIAGNOSIS — Z7982 Long term (current) use of aspirin: Secondary | ICD-10-CM

## 2017-03-16 DIAGNOSIS — Z8546 Personal history of malignant neoplasm of prostate: Secondary | ICD-10-CM

## 2017-03-16 DIAGNOSIS — Z5189 Encounter for other specified aftercare: Secondary | ICD-10-CM | POA: Diagnosis not present

## 2017-03-16 DIAGNOSIS — E785 Hyperlipidemia, unspecified: Secondary | ICD-10-CM | POA: Diagnosis present

## 2017-03-16 DIAGNOSIS — M5137 Other intervertebral disc degeneration, lumbosacral region: Secondary | ICD-10-CM | POA: Diagnosis not present

## 2017-03-16 DIAGNOSIS — K219 Gastro-esophageal reflux disease without esophagitis: Secondary | ICD-10-CM | POA: Diagnosis not present

## 2017-03-16 DIAGNOSIS — Z9842 Cataract extraction status, left eye: Secondary | ICD-10-CM

## 2017-03-16 DIAGNOSIS — Z8673 Personal history of transient ischemic attack (TIA), and cerebral infarction without residual deficits: Secondary | ICD-10-CM | POA: Diagnosis not present

## 2017-03-16 DIAGNOSIS — E58 Dietary calcium deficiency: Secondary | ICD-10-CM | POA: Diagnosis not present

## 2017-03-16 DIAGNOSIS — M5116 Intervertebral disc disorders with radiculopathy, lumbar region: Secondary | ICD-10-CM | POA: Diagnosis present

## 2017-03-16 DIAGNOSIS — J449 Chronic obstructive pulmonary disease, unspecified: Secondary | ICD-10-CM | POA: Diagnosis not present

## 2017-03-16 DIAGNOSIS — M48061 Spinal stenosis, lumbar region without neurogenic claudication: Principal | ICD-10-CM | POA: Diagnosis present

## 2017-03-16 DIAGNOSIS — I10 Essential (primary) hypertension: Secondary | ICD-10-CM | POA: Diagnosis not present

## 2017-03-16 DIAGNOSIS — M549 Dorsalgia, unspecified: Secondary | ICD-10-CM | POA: Diagnosis not present

## 2017-03-16 DIAGNOSIS — N183 Chronic kidney disease, stage 3 (moderate): Secondary | ICD-10-CM | POA: Diagnosis not present

## 2017-03-16 DIAGNOSIS — E539 Vitamin B deficiency, unspecified: Secondary | ICD-10-CM | POA: Diagnosis not present

## 2017-03-16 DIAGNOSIS — Z981 Arthrodesis status: Secondary | ICD-10-CM | POA: Diagnosis not present

## 2017-03-16 DIAGNOSIS — E611 Iron deficiency: Secondary | ICD-10-CM | POA: Diagnosis not present

## 2017-03-16 DIAGNOSIS — Z79899 Other long term (current) drug therapy: Secondary | ICD-10-CM

## 2017-03-16 DIAGNOSIS — Z902 Acquired absence of lung [part of]: Secondary | ICD-10-CM | POA: Diagnosis not present

## 2017-03-16 DIAGNOSIS — Z96612 Presence of left artificial shoulder joint: Secondary | ICD-10-CM | POA: Diagnosis not present

## 2017-03-16 DIAGNOSIS — Z85118 Personal history of other malignant neoplasm of bronchus and lung: Secondary | ICD-10-CM

## 2017-03-16 DIAGNOSIS — I25810 Atherosclerosis of coronary artery bypass graft(s) without angina pectoris: Secondary | ICD-10-CM | POA: Diagnosis present

## 2017-03-16 DIAGNOSIS — Z950 Presence of cardiac pacemaker: Secondary | ICD-10-CM

## 2017-03-16 DIAGNOSIS — G4700 Insomnia, unspecified: Secondary | ICD-10-CM | POA: Diagnosis not present

## 2017-03-16 DIAGNOSIS — M9669 Fracture of other bone following insertion of orthopedic implant, joint prosthesis, or bone plate: Secondary | ICD-10-CM | POA: Diagnosis not present

## 2017-03-16 DIAGNOSIS — M4807 Spinal stenosis, lumbosacral region: Secondary | ICD-10-CM | POA: Diagnosis not present

## 2017-03-16 DIAGNOSIS — Z96653 Presence of artificial knee joint, bilateral: Secondary | ICD-10-CM | POA: Diagnosis present

## 2017-03-16 DIAGNOSIS — Z951 Presence of aortocoronary bypass graft: Secondary | ICD-10-CM

## 2017-03-16 DIAGNOSIS — Z419 Encounter for procedure for purposes other than remedying health state, unspecified: Secondary | ICD-10-CM

## 2017-03-16 DIAGNOSIS — Z6833 Body mass index (BMI) 33.0-33.9, adult: Secondary | ICD-10-CM

## 2017-03-16 DIAGNOSIS — M4320 Fusion of spine, site unspecified: Secondary | ICD-10-CM | POA: Diagnosis not present

## 2017-03-16 DIAGNOSIS — M62838 Other muscle spasm: Secondary | ICD-10-CM | POA: Diagnosis not present

## 2017-03-16 DIAGNOSIS — J302 Other seasonal allergic rhinitis: Secondary | ICD-10-CM | POA: Diagnosis not present

## 2017-03-16 DIAGNOSIS — R52 Pain, unspecified: Secondary | ICD-10-CM | POA: Diagnosis not present

## 2017-03-16 DIAGNOSIS — M545 Low back pain: Secondary | ICD-10-CM | POA: Diagnosis not present

## 2017-03-16 DIAGNOSIS — M6281 Muscle weakness (generalized): Secondary | ICD-10-CM | POA: Diagnosis not present

## 2017-03-16 DIAGNOSIS — M79606 Pain in leg, unspecified: Secondary | ICD-10-CM | POA: Diagnosis not present

## 2017-03-16 DIAGNOSIS — J209 Acute bronchitis, unspecified: Secondary | ICD-10-CM | POA: Diagnosis not present

## 2017-03-16 SURGERY — POSTERIOR LUMBAR FUSION 1 LEVEL
Anesthesia: General

## 2017-03-16 MED ORDER — 0.9 % SODIUM CHLORIDE (POUR BTL) OPTIME
TOPICAL | Status: DC | PRN
  Administered 2017-03-16: 1000 mL

## 2017-03-16 MED ORDER — DOCUSATE SODIUM 100 MG PO CAPS
100.0000 mg | ORAL_CAPSULE | Freq: Two times a day (BID) | ORAL | Status: DC
  Administered 2017-03-16 – 2017-03-19 (×6): 100 mg via ORAL
  Filled 2017-03-16 (×6): qty 1

## 2017-03-16 MED ORDER — NEOSTIGMINE METHYLSULFATE 10 MG/10ML IV SOLN
INTRAVENOUS | Status: DC | PRN
  Administered 2017-03-16: 4 mg via INTRAVENOUS

## 2017-03-16 MED ORDER — OXYCODONE HCL 5 MG PO TABS
5.0000 mg | ORAL_TABLET | Freq: Once | ORAL | Status: AC | PRN
  Administered 2017-03-16: 5 mg via ORAL

## 2017-03-16 MED ORDER — CEFAZOLIN SODIUM-DEXTROSE 2-4 GM/100ML-% IV SOLN
INTRAVENOUS | Status: AC
  Filled 2017-03-16: qty 100

## 2017-03-16 MED ORDER — VANCOMYCIN HCL 1000 MG IV SOLR
INTRAVENOUS | Status: AC
  Filled 2017-03-16: qty 1000

## 2017-03-16 MED ORDER — PROPOFOL 10 MG/ML IV BOLUS
INTRAVENOUS | Status: AC
  Filled 2017-03-16: qty 20

## 2017-03-16 MED ORDER — FERROUS SULFATE 325 (65 FE) MG PO TABS
325.0000 mg | ORAL_TABLET | Freq: Every day | ORAL | Status: DC
  Administered 2017-03-17 – 2017-03-19 (×3): 325 mg via ORAL
  Filled 2017-03-16 (×3): qty 1

## 2017-03-16 MED ORDER — PHENOL 1.4 % MT LIQD: 1.0000 | OROMUCOSAL | Status: DC | PRN

## 2017-03-16 MED ORDER — BACITRACIN ZINC 500 UNIT/GM EX OINT
TOPICAL_OINTMENT | CUTANEOUS | Status: DC | PRN
  Administered 2017-03-16: 1 via TOPICAL

## 2017-03-16 MED ORDER — DONEPEZIL HCL 5 MG PO TABS
5.0000 mg | ORAL_TABLET | Freq: Every day | ORAL | Status: DC
  Administered 2017-03-16 – 2017-03-18 (×3): 5 mg via ORAL
  Filled 2017-03-16 (×4): qty 1

## 2017-03-16 MED ORDER — TRAMADOL-ACETAMINOPHEN 37.5-325 MG PO TABS
1.0000 | ORAL_TABLET | ORAL | Status: DC | PRN
  Administered 2017-03-19: 1 via ORAL
  Filled 2017-03-16 (×2): qty 1

## 2017-03-16 MED ORDER — SURGIFOAM 100 EX MISC
CUTANEOUS | Status: DC | PRN
  Administered 2017-03-16: 15:00:00 via TOPICAL

## 2017-03-16 MED ORDER — VANCOMYCIN HCL 1000 MG IV SOLR
INTRAVENOUS | Status: DC | PRN
  Administered 2017-03-16: 1000 mg via TOPICAL

## 2017-03-16 MED ORDER — ACETAMINOPHEN 650 MG RE SUPP: 650.0000 mg | RECTAL | Status: DC | PRN

## 2017-03-16 MED ORDER — ATORVASTATIN CALCIUM 40 MG PO TABS
40.0000 mg | ORAL_TABLET | Freq: Every day | ORAL | Status: DC
  Administered 2017-03-16 – 2017-03-18 (×3): 40 mg via ORAL
  Filled 2017-03-16: qty 1
  Filled 2017-03-16: qty 2
  Filled 2017-03-16: qty 1

## 2017-03-16 MED ORDER — BACITRACIN ZINC 500 UNIT/GM EX OINT
TOPICAL_OINTMENT | CUTANEOUS | Status: AC
  Filled 2017-03-16: qty 28.35

## 2017-03-16 MED ORDER — LACTATED RINGERS IV SOLN
INTRAVENOUS | Status: DC | PRN
  Administered 2017-03-16: 12:00:00 via INTRAVENOUS

## 2017-03-16 MED ORDER — PROPOFOL 10 MG/ML IV BOLUS
INTRAVENOUS | Status: DC | PRN
  Administered 2017-03-16: 30 mg via INTRAVENOUS
  Administered 2017-03-16: 70 mg via INTRAVENOUS

## 2017-03-16 MED ORDER — ONDANSETRON HCL 4 MG/2ML IJ SOLN: 4.0000 mg | Freq: Once | INTRAMUSCULAR | Status: DC | PRN

## 2017-03-16 MED ORDER — ONDANSETRON HCL 4 MG/2ML IJ SOLN: 4.0000 mg | Freq: Four times a day (QID) | INTRAMUSCULAR | Status: DC | PRN

## 2017-03-16 MED ORDER — BUPIVACAINE-EPINEPHRINE (PF) 0.5% -1:200000 IJ SOLN
INTRAMUSCULAR | Status: AC
  Filled 2017-03-16: qty 30

## 2017-03-16 MED ORDER — PANTOPRAZOLE SODIUM 40 MG PO TBEC
40.0000 mg | DELAYED_RELEASE_TABLET | Freq: Every day | ORAL | Status: DC
  Administered 2017-03-17 – 2017-03-19 (×3): 40 mg via ORAL
  Filled 2017-03-16 (×3): qty 1

## 2017-03-16 MED ORDER — GLYCOPYRROLATE 0.2 MG/ML IJ SOLN
INTRAMUSCULAR | Status: DC | PRN
  Administered 2017-03-16: 0.6 mg via INTRAVENOUS

## 2017-03-16 MED ORDER — FENTANYL CITRATE (PF) 250 MCG/5ML IJ SOLN
INTRAMUSCULAR | Status: AC
  Filled 2017-03-16: qty 5

## 2017-03-16 MED ORDER — BUPIVACAINE-EPINEPHRINE (PF) 0.5% -1:200000 IJ SOLN
INTRAMUSCULAR | Status: DC | PRN
  Administered 2017-03-16: 10 mL

## 2017-03-16 MED ORDER — OXYCODONE-ACETAMINOPHEN 5-325 MG PO TABS
1.0000 | ORAL_TABLET | ORAL | Status: DC | PRN
  Administered 2017-03-16 – 2017-03-19 (×11): 2 via ORAL
  Filled 2017-03-16 (×11): qty 2

## 2017-03-16 MED ORDER — SODIUM CHLORIDE 0.9 % IR SOLN
Status: DC | PRN
  Administered 2017-03-16: 15:00:00

## 2017-03-16 MED ORDER — THROMBIN 5000 UNITS EX SOLR
CUTANEOUS | Status: AC
  Filled 2017-03-16: qty 5000

## 2017-03-16 MED ORDER — FENTANYL CITRATE (PF) 100 MCG/2ML IJ SOLN
INTRAMUSCULAR | Status: AC
  Filled 2017-03-16: qty 2

## 2017-03-16 MED ORDER — FENTANYL CITRATE (PF) 100 MCG/2ML IJ SOLN
25.0000 ug | INTRAMUSCULAR | Status: DC | PRN
  Administered 2017-03-16 (×2): 50 ug via INTRAVENOUS

## 2017-03-16 MED ORDER — SODIUM CHLORIDE 0.9 % IV SOLN
INTRAVENOUS | Status: DC
  Administered 2017-03-16: 13:00:00 via INTRAVENOUS

## 2017-03-16 MED ORDER — CEFAZOLIN SODIUM-DEXTROSE 2-4 GM/100ML-% IV SOLN
2.0000 g | INTRAVENOUS | Status: AC
  Administered 2017-03-16 (×2): 2 g via INTRAVENOUS

## 2017-03-16 MED ORDER — BUPIVACAINE LIPOSOME 1.3 % IJ SUSP
20.0000 mL | INTRAMUSCULAR | Status: AC
  Filled 2017-03-16: qty 20

## 2017-03-16 MED ORDER — CYCLOBENZAPRINE HCL 10 MG PO TABS
10.0000 mg | ORAL_TABLET | Freq: Three times a day (TID) | ORAL | Status: DC | PRN
  Administered 2017-03-16 – 2017-03-19 (×4): 10 mg via ORAL
  Filled 2017-03-16 (×4): qty 1

## 2017-03-16 MED ORDER — THROMBIN 20000 UNITS EX SOLR
CUTANEOUS | Status: AC
  Filled 2017-03-16: qty 20000

## 2017-03-16 MED ORDER — OXYCODONE HCL 5 MG PO TABS
ORAL_TABLET | ORAL | Status: AC
  Filled 2017-03-16: qty 1

## 2017-03-16 MED ORDER — ACETAMINOPHEN 325 MG PO TABS: 650.0000 mg | ORAL_TABLET | ORAL | Status: DC | PRN

## 2017-03-16 MED ORDER — CHLORHEXIDINE GLUCONATE CLOTH 2 % EX PADS: 6.0000 | MEDICATED_PAD | Freq: Once | CUTANEOUS | Status: DC

## 2017-03-16 MED ORDER — ADULT MULTIVITAMIN W/MINERALS CH
1.0000 | ORAL_TABLET | Freq: Every day | ORAL | Status: DC
  Administered 2017-03-17 – 2017-03-19 (×4): 1 via ORAL
  Filled 2017-03-16 (×3): qty 1

## 2017-03-16 MED ORDER — ONDANSETRON HCL 4 MG PO TABS: 4.0000 mg | ORAL_TABLET | Freq: Four times a day (QID) | ORAL | Status: DC | PRN

## 2017-03-16 MED ORDER — ZOLPIDEM TARTRATE 5 MG PO TABS
5.0000 mg | ORAL_TABLET | Freq: Every day | ORAL | Status: DC
Start: 2017-03-16 — End: 2017-03-19
  Administered 2017-03-17 – 2017-03-18 (×2): 5 mg via ORAL
  Filled 2017-03-16 (×2): qty 1

## 2017-03-16 MED ORDER — OXYCODONE HCL 5 MG/5ML PO SOLN: 5.0000 mg | Freq: Once | ORAL | Status: AC | PRN

## 2017-03-16 MED ORDER — SODIUM CHLORIDE 0.9% FLUSH: 3.0000 mL | INTRAVENOUS | Status: DC | PRN

## 2017-03-16 MED ORDER — THROMBIN 5000 UNITS EX SOLR
OROMUCOSAL | Status: DC | PRN
  Administered 2017-03-16: 15:00:00 via TOPICAL

## 2017-03-16 MED ORDER — ONDANSETRON HCL 4 MG/2ML IJ SOLN
INTRAMUSCULAR | Status: DC | PRN
  Administered 2017-03-16: 4 mg via INTRAVENOUS

## 2017-03-16 MED ORDER — SODIUM CHLORIDE 0.9 % IV SOLN
Freq: Once | INTRAVENOUS | Status: AC
  Administered 2017-03-16: 13:00:00 via INTRAVENOUS

## 2017-03-16 MED ORDER — FENTANYL CITRATE (PF) 100 MCG/2ML IJ SOLN
INTRAMUSCULAR | Status: DC | PRN
  Administered 2017-03-16 (×4): 50 ug via INTRAVENOUS
  Administered 2017-03-16 (×2): 100 ug via INTRAVENOUS
  Administered 2017-03-16 (×2): 50 ug via INTRAVENOUS

## 2017-03-16 MED ORDER — AMLODIPINE BESYLATE 5 MG PO TABS
5.0000 mg | ORAL_TABLET | Freq: Every day | ORAL | Status: DC
Start: 2017-03-17 — End: 2017-03-19
  Administered 2017-03-17 – 2017-03-19 (×4): 5 mg via ORAL
  Filled 2017-03-16 (×3): qty 1

## 2017-03-16 MED ORDER — ROCURONIUM BROMIDE 10 MG/ML (PF) SYRINGE
PREFILLED_SYRINGE | INTRAVENOUS | Status: DC | PRN
  Administered 2017-03-16: 70 mg via INTRAVENOUS
  Administered 2017-03-16: 10 mg via INTRAVENOUS

## 2017-03-16 MED ORDER — SODIUM CHLORIDE 0.9 % IV SOLN: 250.0000 mL | INTRAVENOUS | Status: DC

## 2017-03-16 MED ORDER — MENTHOL 3 MG MT LOZG: 1.0000 | LOZENGE | OROMUCOSAL | Status: DC | PRN

## 2017-03-16 MED ORDER — LIDOCAINE 2% (20 MG/ML) 5 ML SYRINGE
INTRAMUSCULAR | Status: DC | PRN
  Administered 2017-03-16: 60 mg via INTRAVENOUS

## 2017-03-16 MED ORDER — MORPHINE SULFATE (PF) 4 MG/ML IV SOLN
4.0000 mg | INTRAVENOUS | Status: DC | PRN
  Administered 2017-03-16 – 2017-03-17 (×2): 4 mg via INTRAVENOUS
  Filled 2017-03-16 (×2): qty 1

## 2017-03-16 MED ORDER — BISACODYL 10 MG RE SUPP: 10.0000 mg | Freq: Every day | RECTAL | Status: DC | PRN

## 2017-03-16 MED ORDER — CEFAZOLIN SODIUM-DEXTROSE 2-4 GM/100ML-% IV SOLN
2.0000 g | Freq: Three times a day (TID) | INTRAVENOUS | Status: AC
  Administered 2017-03-16 – 2017-03-17 (×2): 2 g via INTRAVENOUS
  Filled 2017-03-16 (×2): qty 100

## 2017-03-16 MED ORDER — SODIUM CHLORIDE 0.9% FLUSH
3.0000 mL | Freq: Two times a day (BID) | INTRAVENOUS | Status: DC
  Administered 2017-03-17 – 2017-03-18 (×3): 3 mL via INTRAVENOUS

## 2017-03-16 SURGICAL SUPPLY — 64 items
BAG DECANTER FOR FLEXI CONT (MISCELLANEOUS) ×2 IMPLANT
BENZOIN TINCTURE PRP APPL 2/3 (GAUZE/BANDAGES/DRESSINGS) ×2 IMPLANT
BLADE CLIPPER SURG (BLADE) IMPLANT
BUR MATCHSTICK NEURO 3.0 LAGG (BURR) ×2 IMPLANT
BUR PRECISION FLUTE 6.0 (BURR) ×2 IMPLANT
CANISTER SUCT 3000ML PPV (MISCELLANEOUS) ×2 IMPLANT
CAP REVERE LOCKING (Cap) ×8 IMPLANT
CARTRIDGE OIL MAESTRO DRILL (MISCELLANEOUS) ×1 IMPLANT
CONT SPEC 4OZ CLIKSEAL STRL BL (MISCELLANEOUS) ×2 IMPLANT
COVER BACK TABLE 60X90IN (DRAPES) ×2 IMPLANT
DIFFUSER DRILL AIR PNEUMATIC (MISCELLANEOUS) ×2 IMPLANT
DRAPE C-ARM 42X72 X-RAY (DRAPES) ×8 IMPLANT
DRAPE HALF SHEET 40X57 (DRAPES) ×6 IMPLANT
DRAPE LAPAROTOMY 100X72X124 (DRAPES) ×2 IMPLANT
DRAPE POUCH INSTRU U-SHP 10X18 (DRAPES) ×2 IMPLANT
DRAPE SURG 17X23 STRL (DRAPES) ×8 IMPLANT
ELECT BLADE 4.0 EZ CLEAN MEGAD (MISCELLANEOUS) ×2
ELECT REM PT RETURN 9FT ADLT (ELECTROSURGICAL) ×2
ELECTRODE BLDE 4.0 EZ CLN MEGD (MISCELLANEOUS) ×1 IMPLANT
ELECTRODE REM PT RTRN 9FT ADLT (ELECTROSURGICAL) ×1 IMPLANT
EVACUATOR 1/8 PVC DRAIN (DRAIN) IMPLANT
GAUZE SPONGE 4X4 12PLY STRL (GAUZE/BANDAGES/DRESSINGS) ×2 IMPLANT
GAUZE SPONGE 4X4 16PLY XRAY LF (GAUZE/BANDAGES/DRESSINGS) IMPLANT
GLOVE BIO SURGEON STRL SZ8 (GLOVE) ×4 IMPLANT
GLOVE BIO SURGEON STRL SZ8.5 (GLOVE) ×4 IMPLANT
GLOVE BIOGEL PI IND STRL 7.0 (GLOVE) ×1 IMPLANT
GLOVE BIOGEL PI IND STRL 7.5 (GLOVE) ×1 IMPLANT
GLOVE BIOGEL PI INDICATOR 7.0 (GLOVE) ×1
GLOVE BIOGEL PI INDICATOR 7.5 (GLOVE) ×1
GLOVE EXAM NITRILE LRG STRL (GLOVE) IMPLANT
GLOVE EXAM NITRILE XL STR (GLOVE) IMPLANT
GLOVE EXAM NITRILE XS STR PU (GLOVE) IMPLANT
GLOVE SURG SS PI 7.0 STRL IVOR (GLOVE) ×4 IMPLANT
GOWN STRL REUS W/ TWL LRG LVL3 (GOWN DISPOSABLE) ×1 IMPLANT
GOWN STRL REUS W/ TWL XL LVL3 (GOWN DISPOSABLE) ×2 IMPLANT
GOWN STRL REUS W/TWL 2XL LVL3 (GOWN DISPOSABLE) IMPLANT
GOWN STRL REUS W/TWL LRG LVL3 (GOWN DISPOSABLE) ×1
GOWN STRL REUS W/TWL XL LVL3 (GOWN DISPOSABLE) ×2
KIT BASIN OR (CUSTOM PROCEDURE TRAY) ×2 IMPLANT
KIT ROOM TURNOVER OR (KITS) ×2 IMPLANT
NEEDLE HYPO 21X1.5 SAFETY (NEEDLE) ×2 IMPLANT
NEEDLE HYPO 22GX1.5 SAFETY (NEEDLE) ×2 IMPLANT
NS IRRIG 1000ML POUR BTL (IV SOLUTION) ×2 IMPLANT
OIL CARTRIDGE MAESTRO DRILL (MISCELLANEOUS) ×2
PACK LAMINECTOMY NEURO (CUSTOM PROCEDURE TRAY) ×2 IMPLANT
PAD ARMBOARD 7.5X6 YLW CONV (MISCELLANEOUS) ×10 IMPLANT
PATTIES SURGICAL .5 X1 (DISPOSABLE) IMPLANT
PATTIES SURGICAL 1X1 (DISPOSABLE) ×2 IMPLANT
ROD REVERE 6.35 40MM (Rod) ×4 IMPLANT
SCREW 6.5X60 (Screw) ×4 IMPLANT
SCREW 7.5X45MM (Screw) ×2 IMPLANT
SCREW REVERE 6.35 7.5X40 (Screw) ×2 IMPLANT
SPONGE LAP 4X18 X RAY DECT (DISPOSABLE) IMPLANT
SPONGE NEURO XRAY DETECT 1X3 (DISPOSABLE) IMPLANT
SPONGE SURGIFOAM ABS GEL 100 (HEMOSTASIS) ×2 IMPLANT
STRIP CLOSURE SKIN 1/2X4 (GAUZE/BANDAGES/DRESSINGS) ×2 IMPLANT
SUT VIC AB 1 CT1 18XBRD ANBCTR (SUTURE) ×2 IMPLANT
SUT VIC AB 1 CT1 8-18 (SUTURE) ×2
SUT VIC AB 2-0 CP2 18 (SUTURE) ×4 IMPLANT
TAPE CLOTH SURG 4X10 WHT LF (GAUZE/BANDAGES/DRESSINGS) ×2 IMPLANT
TOWEL GREEN STERILE (TOWEL DISPOSABLE) ×2 IMPLANT
TOWEL GREEN STERILE FF (TOWEL DISPOSABLE) ×2 IMPLANT
TRAY FOLEY W/METER SILVER 16FR (SET/KITS/TRAYS/PACK) ×2 IMPLANT
WATER STERILE IRR 1000ML POUR (IV SOLUTION) ×2 IMPLANT

## 2017-03-16 NOTE — Progress Notes (Signed)
[employer] rep travis contacted about patients surgery for today at 140  [phone]

## 2017-03-16 NOTE — Anesthesia Postprocedure Evaluation (Signed)
Anesthesia Post Note  Patient: Maze T Lytle Malburg.  Procedure(s) Performed: Procedure(s) (LRB): POSTERIOR LUMBAR INTERBODY FUSION, INTERBODY PROSTHESIS, POSTERIOR LATERAL ARTHRODESIS, POSTERIOR NON-SEGMENTAL INSTRUMENTATION LUMBAR FIVE- SACRAL ONE (N/A)  Patient location during evaluation: PACU Anesthesia Type: General Level of consciousness: awake and alert Pain management: pain level controlled Vital Signs Assessment: post-procedure vital signs reviewed and stable Respiratory status: spontaneous breathing, nonlabored ventilation, respiratory function stable and patient connected to nasal cannula oxygen Cardiovascular status: blood pressure returned to baseline and stable Postop Assessment: no signs of nausea or vomiting Anesthetic complications: no       Last Vitals:  Vitals:   03/16/17 2000 03/16/17 2041  BP: (!) 144/84 (!) 149/79  Pulse: 82 91  Resp: 14 17  Temp: 36.5 C 36.6 C    Last Pain:  Vitals:   03/16/17 2128  TempSrc:   PainSc: [place]

## 2017-03-16 NOTE — Progress Notes (Signed)
Patient ID: Rick Mcbride., male   DOB: Oct 07, 1941, 76 y.o.   MRN: [id number] Subjective:  The patient is alert and pleasant. He is in no apparent distress.  Objective: Vital signs in last 24 hours: Temp:  [97.5 F (36.4 C)-98 F (36.7 C)] 97.5 F (36.4 C) (05/09 1905) Pulse Rate:  [72-90] 90 (05/09 1905) Resp:  [10-20] 10 (05/09 1905) BP: (130-164)/(77-99) 130/77 (05/09 1905) SpO2:  [100 %] 100 % (05/09 1905)  Intake/Output from previous day: No intake/output data recorded. Intake/Output this shift: No intake/output data recorded.  Physical exam the patient is alert and pleasant. His strength is grossly normal and spinal gastrocnemius and dorsiflexors.  Lab Results: No results for input(s): WBC, HGB, HCT, PLT in the last 72 hours. BMET No results for input(s): NA, K, CL, CO2, GLUCOSE, BUN, CREATININE, CALCIUM in the last 72 hours.  Studies/Results: Dg Lumbar Spine 1 View  Result Date: 03/16/2017 CLINICAL DATA:  L5-S1 posterior fusion. EXAM: LUMBAR SPINE - 1 VIEW COMPARISON:  CT scan of November 16, 2016. FINDINGS: Single intraoperative cross-table lateral projection of the lumbar spine demonstrates a surgical probe directed toward the posterior elements of L5. IMPRESSION: Surgical localization as described above. Electronically Signed   By: Marijo Conception, M.D.   On: 03/16/2017 16:01    Assessment/Plan: The patient is doing well. I spoke with his family.  LOS: 0 days     Sunny Aguon D 03/16/2017, 7:17 PM

## 2017-03-16 NOTE — H&P (Signed)
Subjective: The patient is a 76 year old white male who has complained of back, buttock and leg pain consistent with neurogenic claudication. He has failed medical management and was worked up with lumbar x-rays in the lumbar MRI. This demonstrated the patient had an L5-S1 spondylolisthesis with spinal stenosis. I discussed the various treatment options with the patient including surgery. He has weighed the risks, benefits, and alternatives to surgery and decided to proceed with an L5-S1 decompression, instrumentation, and fusion.   Past Medical History:  Diagnosis Date  . AAA (abdominal aortic aneurysm) ([place])   . AAA (abdominal aortic aneurysm) without rupture ([place])   . Anemia   . Aneurysm ([place])    abd aortic  . Anxiety   . Atrophic kidney   . Cervical radiculopathy   . Chronic airway obstruction (HCC)    not aware of this  . Chronic kidney disease (CKD), stage III (moderate)    followed by Dr. Johnny Bridge  . Chronic tension headaches   . Coronary artery disease   . Coronary atherosclerosis of autologous vein bypass graft   . DDD (degenerative disc disease), lumbar   . Degenerative disc disease, lumbar    with lumbar radiculopathy  . Dyspnea   . Elbow fracture, left   . GERD (gastroesophageal reflux disease)   . H/O adenomatous polyp of colon   . H/O hemorrhoids   . H/O urticaria   . Headache   . Heart disease   . Hypercholesteremia   . Hyperlipidemia   . 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 Lucky Cowboy  . Lung cancer ([place])   . Lung cancer ([place])   . Meralgia paresthetica   . Meralgia paresthetica   . Neuralgia   . Osteoarthritis   . Osteoarthritis    s/p L knee surgery  . 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 Presence of permanent cardiac pacemaker   . Prostate cancer ([place])   . Second degree AV block    Followed by Dr. Nehemiah Massed  . Sinoatrial node dysfunction (HCC)   . Status post partial lobectomy of  lung    bottom right   . Stroke ([place])   . TIA (transient ischemic attack)     Past Surgical History:  Procedure Laterality Date  . CATARACT EXTRACTION    . COLONOSCOPY    . COLONOSCOPY    . COLONOSCOPY WITH PROPOFOL N/A 10/20/2015   Procedure: COLONOSCOPY WITH PROPOFOL;  Surgeon: Manya Silvas, MD;  Location: [facility] ENDOSCOPY;  Service: Endoscopy;  Laterality: N/A;  . CORONARY ARTERY BYPASS GRAFT     triple  . coronary atherosclerosis of autologous vein bypass graft    . EMBOLIZATION Right 12/27/2016   Procedure: Embolization;  Surgeon: Algernon Huxley, MD;  Location: [place] CV LAB;  Service: Cardiovascular;  Laterality: Right;  . ENDOVASCULAR REPAIR/STENT GRAFT N/A 01/05/2017   Procedure: Endovascular Repair/Stent Graft;  Surgeon: Algernon Huxley, MD;  Location: [place] CV LAB;  Service: Cardiovascular;  Laterality: N/A;  . EYE SURGERY Bilateral    cataract extraction  . JOINT REPLACEMENT     shoulder and knees  . KNEE ARTHROSCOPY    . LOBECTOMY  01/31/13   RLL w/squamous cell carcinoma lobectomy  . LUNG REMOVAL, PARTIAL  2014   right lower lobe  . PACEMAKER INSERTION    . PACEMAKER INSERTION  12/2012   Dual chanber pacemaker generator  .  partial shoulder replacement Right   . POLYPECTOMY    . PROSTATECTOMY    . TOTAL KNEE ARTHROPLASTY Bilateral   . TOTAL SHOULDER ARTHROPLASTY Left 07/10/2015   Procedure: TOTAL SHOULDER ARTHROPLASTY;  Surgeon: [name], MD;  Location: [facility] ORS;  Service: Orthopedics;  Laterality: Left;  . TOTAL SHOULDER REPLACEMENT      Allergies  Allergen Reactions  . No Known Allergies     Social History  Substance Use Topics  . Smoking status: Former Smoker    Packs/day: 1.50    Years: 45.00    Quit date: 04/07/2004  . Smokeless tobacco: Never Used  . Alcohol use No    Family History  Problem Relation Age of Onset  . Heart attack Mother   . Heart attack Father   . Breast cancer Sister   . Asthma Sister    Prior to Admission  medications   Medication Sig Start Date End Date Taking? Authorizing Provider  amLODipine (NORVASC) 5 MG tablet Take 1 tablet (5 mg total) by mouth daily. 01/29/17  Yes Theodoro Grist, MD  aspirin EC 81 MG tablet Take 81 mg by mouth daily.    Yes [provider]  atorvastatin (LIPITOR) 40 MG tablet Take 40 mg by mouth at bedtime.  10/30/14  Yes [provider]  Calcium Carb-Cholecalciferol (CALCIUM 600 + D PO) Take 1 tablet by mouth daily.   Yes [provider]  Chlorpheniramine Maleate (ALLERGY RELIEF PO) Take 1 tablet by mouth daily.   Yes [provider]  clopidogrel (PLAVIX) 75 MG tablet Take 75 mg by mouth daily.  10/30/14  Yes [provider]  docusate sodium (COLACE) 100 MG capsule Take 100 mg by mouth daily.   Yes [provider]  donepezil (ARICEPT) 5 MG tablet Take 5 mg by mouth at bedtime.   Yes [provider]  ferrous sulfate (SLOW FE) 160 (50 FE) MG TBCR SR tablet Take 1 tablet by mouth daily.    Yes [provider]  Multiple Vitamins-Minerals (CENTRUM SILVER ULTRA MENS) TABS Take 1 tablet by mouth daily.    Yes [provider]  Multiple Vitamins-Minerals (PRESERVISION/LUTEIN) CAPS Take 1 capsule by mouth 2 (two) times daily.    Yes [provider]  Omega-3 Fatty Acids (FISH OIL) 1200 MG CAPS Take 2 capsules by mouth daily.    Yes [provider]  omeprazole (PRILOSEC) 20 MG capsule Take 20 mg by mouth daily.  10/30/14  Yes [provider]  oxyCODONE-acetaminophen (PERCOCET/ROXICET) 5-325 MG tablet Take 1-2 tablets by mouth every 4 (four) hours as needed for moderate pain. Patient taking differently: Take 1 tablet by mouth every 4 (four) hours as needed for moderate pain.  01/28/17  Yes Theodoro Grist, MD  traMADol-acetaminophen (ULTRACET) 37.5-325 MG per tablet Take 1 tablet by mouth every 4 (four) hours as needed for moderate pain or severe pain.  01/12/15  Yes [provider]  vitamin B-12 (CYANOCOBALAMIN) 1000 MCG tablet Take 1,000 mcg by mouth daily.   Yes [provider]  zolpidem (AMBIEN) 10 MG tablet Take 10 mg by mouth at bedtime.  10/30/14  Yes [provider]  cephALEXin (KEFLEX) 500 MG capsule Take 1 capsule (500 mg total) by mouth 2 (two) times daily. Patient not taking: Reported on 02/28/2017 01/28/17   Theodoro Grist, MD  zolpidem (AMBIEN) 5 MG tablet Take 5 mg by mouth at bedtime.    [provider]     Review of Systems  Positive ROS: As above  All other systems have been reviewed and were otherwise negative with the exception of those mentioned in the HPI and as above.  Objective: Vital signs in last 24 hours: Temp:  [98 F (36.7 C)] 98 F (36.7 C) (05/09 1202) Pulse Rate:  [72] 72 (05/09 1202) Resp:  [20] 20 (05/09 1202) BP: (164)/(99) 164/99 (05/09 1202) SpO2:  [100 %] 100 % (05/09 1202)  General Appearance: Alert Head: Normocephalic, without obvious abnormality, atraumatic Eyes: PERRL, conjunctiva/corneas clear, EOM's intact,    Ears: Normal  Throat: Normal  Neck: Supple, Back: unremarkable Lungs: Clear to auscultation bilaterally, respirations unlabored Heart: Regular rate and rhythm, no murmur, rub or gallop Abdomen: Soft, non-tender Extremities: Extremities normal, atraumatic, no cyanosis or edema Skin: unremarkable  NEUROLOGIC:   Mental status: alert and oriented,Motor Exam - grossly normal Sensory Exam - grossly normal Reflexes:  Coordination - grossly normal Gait - grossly normal Balance - grossly normal Cranial Nerves: I: smell Not tested  II: visual acuity  OS: Normal  OD: Normal   II: visual fields Full to confrontation  II: pupils Equal, round, reactive to light  III,VII: ptosis None  III,IV,VI: extraocular muscles  Full ROM  V: mastication Normal  V: facial light touch sensation  Normal  V,VII: corneal reflex  Present  VII: facial muscle function - upper  Normal   VII: facial muscle function - lower Normal  VIII: hearing Not tested  IX: soft palate elevation  Normal  IX,X: gag reflex Present  XI: trapezius strength  5/5  XI: sternocleidomastoid strength 5/5  XI: neck flexion strength  5/5  XII: tongue strength  Normal    Data Review Lab Results  Component Value Date   WBC 4.3 01/28/2017   HGB 11.5 (L) 01/28/2017   HCT 33.5 (L) 01/28/2017   MCV 87.1 01/28/2017   PLT 147 (L) 01/28/2017   Lab Results  Component Value Date   NA 137 01/27/2017   K 4.2 01/27/2017   CL 108 01/27/2017   CO2 23 01/27/2017   BUN 34 (H) 01/27/2017   CREATININE 2.01 (H) 01/28/2017   GLUCOSE 92 01/27/2017   Lab Results  Component Value Date   INR 1.01 12/23/2016    Assessment/Plan: L5-S1 spondylolisthesis, spinal stenosis, degenerative disc disease, lumbago, lumbar radiculopathy, neurogenic claudication: I have discussed the situation with the patient and his family. I have reviewed his imaging size with him and pointed out the abnormalities. We have discussed the various treatment options including surgery. I have described the surgical treatment option of an L5-S1 decompression, instrumentation, and fusion. I have shown them surgical models. We have discussed the risks, benefits, alternatives, expected postoperative course, and likelihood of achieving her goals with surgery. I have answered all the patient's questions. He has decided to proceed with surgery.   Chino Sardo D 03/16/2017 2:20 PM

## 2017-03-16 NOTE — Progress Notes (Signed)
Last CBC and BMET in [place] done on 03/02/17.

## 2017-03-16 NOTE — Anesthesia Procedure Notes (Signed)
Procedure Name: Intubation Date/Time: 03/16/2017 2:40 PM Performed by: Everlean Cherry A Pre-anesthesia Checklist: Patient identified, Emergency Drugs available, Suction available and Patient being monitored Patient Re-evaluated:Patient Re-evaluated prior to inductionOxygen Delivery Method: Circle system utilized Preoxygenation: Pre-oxygenation with 100% oxygen Intubation Type: IV induction Ventilation: Mask ventilation without difficulty and Oral airway inserted - appropriate to patient size Laryngoscope Size: Sabra Heck and 2 Grade View: Grade I Tube type: Oral Tube size: 7.5 mm Number of attempts: 1 Airway Equipment and Method: Stylet Placement Confirmation: ETT inserted through vocal cords under direct vision,  breath sounds checked- equal and bilateral and positive ETCO2 Secured at: 23 cm Tube secured with: Tape Dental Injury: Teeth and Oropharynx as per pre-operative assessment

## 2017-03-16 NOTE — Progress Notes (Signed)
Orthopedic Tech Progress Note Patient Details:  Rick Mcbride 30-Aug-1941 [id number] Patient has brace Patient ID: Rick Eth., male   DOB: Aug 26, 1941, 76 y.o.   MRN: [id number]   Rick Mcbride 03/16/2017, 10:24 PM

## 2017-03-16 NOTE — Op Note (Signed)
Brief history: Patient is a 76 year old white male who has complained of back, buttock and leg pain consistent with neurogenic claudication. He has failed medical management. He was worked up with a lumbar myelo CT and x-rays. This demonstrated multilevel degenerative changes. He had a spondylolisthesis at L5-S1 with severe foraminal stenosis. I discussed the various treatment options with the patient including surgery. He has weighed the risks, benefits, and alternatives to surgery and decided proceed with an L5-S1 decompression, instrumentation, and fusion.  Preoperative diagnosis: L5-S1 spondylolisthesis, Degenerative disc disease, spinal stenosis compressing both the L5 and the S1 nerve roots; lumbago; lumbar radiculopathy  Postoperative diagnosis: The same   Procedure: L5 Gill procedure/laminectomy/foraminotomies to decompress the bilateral L5 and S1 nerve roots(the work required to do this was in addition to the work required to do the posterior lumbar interbody fusion because of the patient's spinal stenosis, facet arthropathy. Etc. requiring a wide decompression of the nerve roots.); posterior nonsegmental instrumentation from L5 to S1 with globus titanium pedicle screws and rods; posterior lateral arthrodesis at L5-S1 with local morselized autograft bone and Kinnex bone graft extender.  Surgeon: Dr. Earle Gell  Asst.: Dr. Cyndy Freeze  Anesthesia: Gen. endotracheal  Estimated blood loss: 200 mL  Drains: None  Complications: None  Description of procedure: The patient was brought to the operating room by the anesthesia team. General endotracheal anesthesia was induced. The patient was turned to the prone position on the Wilson frame. The patient's lumbosacral region was then prepared with Betadine scrub and Betadine solution. Sterile drapes were applied.  I then injected the area to be incised with Marcaine with epinephrine solution. I then used the scalpel to make a linear midline incision  over the L5-S1 interspace. I then used electrocautery to perform a bilateral subperiosteal dissection exposing the spinous process and lamina of L4, L5 and the upper sacrum. We then obtained intraoperative radiograph to confirm our location. We then inserted the Verstrac retractor to provide exposure.  I began the decompression by incising the interspinous ligament at L45 and L5-S1. Used Advertising account executive punches to remove the L5 spinous process and the L5 lamina and facet.We then used the Kerrison punches to remove the ligamentum flavum at L4-5 and L5-S1. We performed wide foraminotomies about the bilateral L5-S1 nerve roots completing the decompression.  We inspected the intervertebral disc at L5-S1. It was a significant spondylolisthesis and no way to perform a posterior lumbar and by fusion.  We now turned attention to the instrumentation. Under fluoroscopic guidance we cannulated the bilateral L5 and S1 pedicles with the bone probe. We then removed the bone probe. We then tapped the pedicle with a 5.5 millimeter tap. We then removed the tap. We probed inside the tapped pedicle with a ball probe to rule out cortical breaches. We then inserted a 6.5 x 50 and 7.5 x 45 and 40 millimeter pedicle screw into the L5 and S1 pedicles bilaterally under fluoroscopic guidance. The left L5 pedicle cracked and we had to remove the inferior aspect of it. We then connected the unilateral pedicle screws with a lordotic rod. We compressed the construct and secured the rod in place with the caps. We then tightened the caps appropriately. This completed the instrumentation from L5-S1. We inspected the bilateral L5 and S1 nerve roots and noted they were well decompressed.  We now turned our attention to the posterior lateral arthrodesis at L5-S1 bilaterally. We used the high-speed drill to decorticate the remainder of the facets,transverse process at L5-S1 bilaterally.  We then applied a combination of local  morselized autograft bone and Kinnex bone graft extender over these decorticated posterior lateral structures. This completed the posterior lateral arthrodesis.  We then obtained hemostasis using bipolar electrocautery. We irrigated the wound out with bacitracin solution. We inspected the thecal sac and nerve roots and noted they were well decompressed. We then removed the retractor. We placed vancomycin powder in the wound. We reapproximated patient's thoracolumbar fascia with interrupted #1 Vicryl suture. We reapproximated patient's subcutaneous tissue with interrupted 2-0 Vicryl suture. The reapproximated patient's skin with Steri-Strips and benzoin. The wound was then coated with bacitracin ointment. A sterile dressing was applied. The drapes were removed. The patient was subsequently returned to the supine position where they were extubated by the anesthesia team. He was then transported to the post anesthesia care unit in stable condition. All sponge instrument and needle counts were reportedly correct at the end of this case.

## 2017-03-16 NOTE — Transfer of Care (Signed)
Immediate Anesthesia Transfer of Care Note  Patient: Rick Mcbride.  Procedure(s) Performed: Procedure(s) with comments: POSTERIOR LUMBAR INTERBODY FUSION, INTERBODY PROSTHESIS, POSTERIOR LATERAL ARTHRODESIS, POSTERIOR NON-SEGMENTAL INSTRUMENTATION LUMBAR FIVE- SACRAL ONE (N/A) - POSTERIOR LUMBAR INTERBODY FUSION, INTERBODY PROSTHESIS, POSTERIOR LATERAL ARTHRODESIS, POSTERIOR NON-SEGMENTAL INSTRUMENTATION LUMBAR 5- SACRAL 1  Patient Location: PACU  Anesthesia Type:General  Level of Consciousness: awake, alert  and oriented  Airway & Oxygen Therapy: Patient Spontanous Breathing and Patient connected to nasal cannula oxygen  Post-op Assessment: Report given to RN and Post -op Vital signs reviewed and stable  Post vital signs: Reviewed and stable  Last Vitals:  Vitals:   03/16/17 1202 03/16/17 1905  BP: (!) 164/99   Pulse: 72   Resp: 20   Temp: 36.7 C (P) 36.4 C    Last Pain:  Vitals:   03/16/17 1226  TempSrc:   PainSc: 7       Patients Stated Pain Goal: 5 (09/81/19 1478)  Complications: No apparent anesthesia complications

## 2017-03-16 NOTE — Anesthesia Preprocedure Evaluation (Signed)
Anesthesia Evaluation  Patient identified by MRN, date of birth, ID band Patient awake    Reviewed: Allergy & Precautions, NPO status , Patient's Chart, lab work & pertinent test results  Airway Mallampati: II  TM Distance: >3 FB     Dental  (+) Edentulous Upper, Edentulous Lower   Pulmonary former smoker,    breath sounds clear to auscultation       Cardiovascular  Rhythm:Regular Rate:Normal     Neuro/Psych    GI/Hepatic   Endo/Other    Renal/GU      Musculoskeletal   Abdominal (+) + obese,   Peds  Hematology   Anesthesia Other Findings   Reproductive/Obstetrics                             Anesthesia Physical Anesthesia Plan  ASA: III  Anesthesia Plan: General   Post-op Pain Management:    Induction: Intravenous  Airway Management Planned: Oral ETT  Additional Equipment:   Intra-op Plan:   Post-operative Plan: Extubation in OR  Informed Consent: I have reviewed the patients History and Physical, chart, labs and discussed the procedure including the risks, benefits and alternatives for the proposed anesthesia with the patient or authorized representative who has indicated his/her understanding and acceptance.     Plan Discussed with:   Anesthesia Plan Comments:         Anesthesia Quick Evaluation

## 2017-03-17 LAB — CBC
HEMATOCRIT: 32.2 % — AB (ref 39.0–52.0)
Hemoglobin: 10.3 g/dL — ABNORMAL LOW (ref 13.0–17.0)
MCH: 28.2 pg (ref 26.0–34.0)
MCHC: 32 g/dL (ref 30.0–36.0)
MCV: 88.2 fL (ref 78.0–100.0)
Platelets: 163 10*3/uL (ref 150–400)
RBC: 3.65 MIL/uL — ABNORMAL LOW (ref 4.22–5.81)
RDW: 14.4 % (ref 11.5–15.5)
WBC: 7.2 10*3/uL (ref 4.0–10.5)

## 2017-03-17 LAB — BASIC METABOLIC PANEL
ANION GAP: 6 (ref 5–15)
BUN: 20 mg/dL (ref 6–20)
CHLORIDE: 107 mmol/L (ref 101–111)
CO2: 25 mmol/L (ref 22–32)
CREATININE: 2.09 mg/dL — AB (ref 0.61–1.24)
Calcium: 8.5 mg/dL — ABNORMAL LOW (ref 8.9–10.3)
GFR calc non Af Amer: 29 mL/min — ABNORMAL LOW (ref >60)
GFR, EST AFRICAN AMERICAN: 34 mL/min — AB (ref >60)
Glucose, Bld: 106 mg/dL — ABNORMAL HIGH (ref 65–99)
Potassium: 4.4 mmol/L (ref 3.5–5.1)
Sodium: 138 mmol/L (ref 135–145)

## 2017-03-17 MED ORDER — GABAPENTIN 600 MG PO TABS
300.0000 mg | ORAL_TABLET | Freq: Three times a day (TID) | ORAL | Status: DC
  Administered 2017-03-17 – 2017-03-19 (×6): 300 mg via ORAL
  Filled 2017-03-17 (×6): qty 1

## 2017-03-17 MED ORDER — DEXAMETHASONE SODIUM PHOSPHATE 10 MG/ML IJ SOLN
10.0000 mg | Freq: Four times a day (QID) | INTRAMUSCULAR | Status: AC
  Administered 2017-03-17 – 2017-03-18 (×4): 10 mg via INTRAVENOUS
  Filled 2017-03-17 (×4): qty 1

## 2017-03-17 MED FILL — Sodium Chloride IV Soln 0.9%: INTRAVENOUS | Qty: 1000 | Status: AC

## 2017-03-17 MED FILL — Heparin Sodium (Porcine) Inj 1000 Unit/ML: INTRAMUSCULAR | Qty: 30 | Status: AC

## 2017-03-17 NOTE — Evaluation (Signed)
Physical Therapy Evaluation Patient Details Name: Rick Mcbride. MRN: [id number] DOB: 1941/05/01 Today's Date: 03/17/2017   History of Present Illness  76 y.o. M S/P L5-S1 PLIF. PMH including recent UTI, AAA repair, CKD, CAD w/ bypass, prostate cancer, and chronic back pain. Pt reports he recently had vascular Sx recently to improve blood flow and he attributes that to his UTI.  Clinical Impression  Pt presents to PT with decreased tolerance for functional mobility due to pain, decreased strength and decreased range of motion from above procedure.  He was educated on back precautions and practicing frequent mobility as a means to control pain. This session was limited due to pt's reported severe R leg pain.  He required +2 Mod A to transfer sit > stand and was unable to take steps to the chair. Pt and wife seemed concerned about pt's bladder control and recent hospitalization for UTI.  PTA, he was independent with all mobility and self care, and lives with his wife who is available 24/7.  Recommending SNF in order to increase pt's activity tolerance to maximize return to PLOF.  Will continue to follow acutely    Follow Up Recommendations SNF;Supervision/Assistance - 24 hour    Equipment Recommendations  None recommended by PT    Recommendations for Other Services OT consult     Precautions / Restrictions Precautions Precautions: Back;Fall Precaution Booklet Issued: Yes (comment) Precaution Comments: Pt educated on back precautions and handout reviewed. Required Braces or Orthoses: Spinal Brace Spinal Brace: Lumbar corset;Applied in sitting position Restrictions Weight Bearing Restrictions: No      Mobility  Bed Mobility Overal bed mobility: Needs Assistance Bed Mobility: Rolling;Sidelying to Sit;Sit to Sidelying Rolling: Min guard Sidelying to sit: Mod assist     Sit to sidelying: Mod assist General bed mobility comments: Pt used bedrail and heavy cueing for rolling.   Heavy cueing for S/L > sit to push with bottom arm and physical assist required to elevate trunk.  With sit > S/L, pt required heavy cueing to maintain precautions and physical assist to elevate legs into bed.  Heavy cueing then required to position pt higher in bed and roll to supine without twisting.  Transfers Overall transfer level: Needs assistance Equipment used: Rolling walker (2 wheeled) Transfers: Sit to/from Stand Sit to Stand: Mod assist;+2 physical assistance;From elevated surface         General transfer comment: Pt requiring encouragement and cueing for pursed lip breathing.  +2 Mod A to stand with heavy cueing for upright posture and WB through both legs.  Pt with L lateral lean and reports of severe pain in R leg with standing.  Two standing trials completed, first ~30 seconds and second ~20 seconds.  Ambulation/Gait             General Gait Details: pt unable  Stairs            Wheelchair Mobility    Modified Rankin (Stroke Patients Only)       Balance Overall balance assessment: Needs assistance Sitting-balance support: Feet supported;Single extremity supported Sitting balance-Leahy Scale: Fair Sitting balance - Comments: Pt reports he needs to hold bedrail in sitting due to pain but able to maintain balance without support.   Standing balance support: Bilateral upper extremity supported Standing balance-Leahy Scale: Poor Standing balance comment: L lateral lean due to R LE pain. +2 Mod A required to maintain standing.  Pertinent Vitals/Pain Pain Assessment: Faces Faces Pain Scale: Hurts whole lot Pain Location: R leg worse than back/incision site Pain Descriptors / Indicators: Aching;Sharp;Operative site guarding Pain Intervention(s): Limited activity within patient's tolerance;Monitored during session;Repositioned;Relaxation    Home Living Family/patient expects to be discharged to:: Private  residence Living Arrangements: Spouse/significant other Available Help at Discharge: Family;Available 24 hours/day Type of Home: House Home Access: Level entry     Home Layout: One level Home Equipment: Walker - 2 wheels;Shower seat - built in;Wheelchair - manual;Grab bars - tub/shower;Grab bars - toilet      Prior Function Level of Independence: Independent         Comments: Pt ambulated w/o AD PTA     Hand Dominance        Extremity/Trunk Assessment   Upper Extremity Assessment Upper Extremity Assessment: Overall WFL for tasks assessed    Lower Extremity Assessment Lower Extremity Assessment: Generalized weakness;RLE deficits/detail RLE Deficits / Details: Pt reports severe pain in R leg and reduced ability to control RLE: Unable to fully assess due to pain (Simultaneous filing. User may not have seen previous data.) RLE Coordination: decreased gross motor    Cervical / Trunk Assessment Cervical / Trunk Assessment: Normal  Communication   Communication: No difficulties  Cognition Arousal/Alertness: Awake/alert Behavior During Therapy: Anxious Overall Cognitive Status: Within Functional Limits for tasks assessed                                        General Comments General comments (skin integrity, edema, etc.): Wife present this session.  Pt and wife seem concerned with pt's urinary incontinence.    Exercises     Assessment/Plan    PT Assessment Patient needs continued PT services  PT Problem List Decreased strength;Decreased activity tolerance;Decreased balance;Decreased mobility;Decreased knowledge of use of DME;Decreased safety awareness;Decreased knowledge of precautions;Pain       PT Treatment Interventions DME instruction;Gait training;Functional mobility training;Therapeutic activities;Balance training;Patient/family education    PT Goals (Current goals can be found in the Care Plan section)  Acute Rehab PT Goals Patient  Stated Goal: pt did not state a goal PT Goal Formulation: With patient/family Time For Goal Achievement: 03/31/17 Potential to Achieve Goals: Good    Frequency Min 5X/week   Barriers to discharge        Co-evaluation               AM-PAC PT "6 Clicks" Daily Activity  Outcome Measure Difficulty turning over in bed (including adjusting bedclothes, sheets and blankets)?: Total Difficulty moving from lying on back to sitting on the side of the bed? : Total Difficulty sitting down on and standing up from a chair with arms (e.g., wheelchair, bedside commode, etc,.)?: Total Help needed moving to and from a bed to chair (including a wheelchair)?: Total Help needed walking in hospital room?: Total Help needed climbing 3-5 steps with a railing? : Total 6 Click Score: 6    End of Session Equipment Utilized During Treatment: Gait belt;Back brace Activity Tolerance: Patient limited by pain Patient left: in bed;with call bell/phone within reach;with family/visitor present Nurse Communication: Mobility status PT Visit Diagnosis: Other abnormalities of gait and mobility (R26.89);Muscle weakness (generalized) (M62.81);Pain Pain - Right/Left: Right Pain - part of body: Leg (Back)    Time: 3976-7341 PT Time Calculation (min) (ACUTE ONLY): 50 min   Charges:   PT Evaluation $PT Eval Moderate Complexity: 1  Procedure PT Treatments $Gait Training: 8-22 mins $Therapeutic Activity: 8-22 mins   PT G Codes:        Gaetano Net SPT  Gaetano Net 03/17/2017, 9:56 AM

## 2017-03-17 NOTE — Progress Notes (Signed)
Patient is having difficulty with holding urine. Dribbles mostly when standing. Request to have condom cath to protect dignity. Condom cath applied at this time.

## 2017-03-17 NOTE — Progress Notes (Signed)
Pt received alert, verbal with no noted distress. Surgical gauze dressing site dry and intact. Pt oriented to room. Safety measures in place. Call bell within reach. Family at bedside. Will continue to monitor.

## 2017-03-17 NOTE — Progress Notes (Signed)
Patient is complaining of severe pain radiating from his right hip to feet. Patient stated this is new. Support provided, pain medication given and repositioned as needed. Patient decline ambulating due to severe pain. Awaiting MD to assess.

## 2017-03-17 NOTE — Progress Notes (Signed)
Patient is transferred from unit 3C06 to unit 5C06 at this time. Alert and in stable condition. Report given to receiving nurse Eustaquio Boyden, RN with all questions answered. Transported via bed with wife and all belongings at side.

## 2017-03-17 NOTE — Progress Notes (Signed)
Patient ID: Rick Mcbride., male   DOB: 1941/01/24, 76 y.o.   MRN: [id number] Subjective:  The patient is alert and pleasant. His wife is at the bedside. He complains of right leg pain which occurs when he sits or stands. He is perfectly comfortable in bed. His back is probably sore.  Objective: Vital signs in last 24 hours: Temp:  [97.5 F (36.4 C)-99.5 F (37.5 C)] 99.5 F (37.5 C) (05/10 1228) Pulse Rate:  [75-91] 75 (05/10 1228) Resp:  [10-18] 18 (05/10 1228) BP: (122-153)/(62-84) 126/72 (05/10 1228) SpO2:  [97 %-100 %] 100 % (05/10 1228)  Intake/Output from previous day: 05/09 0701 - 05/10 0700 In: 1900 [I.V.:1900] Out: 950 [Urine:810; Blood:140] Intake/Output this shift: Total I/O In: 240 [P.O.:240] Out: -   Physical exam the patient is alert and oriented 3. His strength is grossly normal his bowel gastrocnemius and right EHL. He has some slight left EHL weakness.  Lab Results:  Recent Labs  03/17/17 0321  WBC 7.2  HGB 10.3*  HCT 32.2*  PLT 163   BMET  Recent Labs  03/17/17 0321  NA 138  K 4.4  CL 107  CO2 25  GLUCOSE 106*  BUN 20  CREATININE 2.09*  CALCIUM 8.5*    Studies/Results: Dg Lumbar Spine Complete  Result Date: 03/16/2017 CLINICAL DATA:  L5-S1 PLIF EXAM: DG C-ARM 61-120 MIN; LUMBAR SPINE - COMPLETE 4+ VIEW COMPARISON:  03/16/2017 FINDINGS: A posterior retractor is in place. Transpedicular screws have been placed at L5 and S1. Partially imaged vascular stents and coils. IMPRESSION: Intraoperative images during posterior fusion of L5-S1. Electronically Signed   By: Nolon Nations M.D.   On: 03/16/2017 19:55   Dg Lumbar Spine 1 View  Result Date: 03/16/2017 CLINICAL DATA:  L5-S1 posterior fusion. EXAM: LUMBAR SPINE - 1 VIEW COMPARISON:  CT scan of November 16, 2016. FINDINGS: Single intraoperative cross-table lateral projection of the lumbar spine demonstrates a surgical probe directed toward the posterior elements of L5. IMPRESSION: Surgical  localization as described above. Electronically Signed   By: Marijo Conception, M.D.   On: 03/16/2017 16:01   Dg C-arm 1-60 Min  Result Date: 03/16/2017 CLINICAL DATA:  L5-S1 PLIF EXAM: DG C-ARM 61-120 MIN; LUMBAR SPINE - COMPLETE 4+ VIEW COMPARISON:  03/16/2017 FINDINGS: A posterior retractor is in place. Transpedicular screws have been placed at L5 and S1. Partially imaged vascular stents and coils. IMPRESSION: Intraoperative images during posterior fusion of L5-S1. Electronically Signed   By: Nolon Nations M.D.   On: 03/16/2017 19:55    Assessment/Plan: Postop day #1: I will add Neurontin and Decadron to see we can help him with his radicular symptoms. We will continue to attempt to mobilize him with physical therapy.  LOS: 1 day     Blaize Nipper D 03/17/2017, 2:15 PM

## 2017-03-18 NOTE — Progress Notes (Signed)
Patient ID: Rick Mcbride., male   DOB: 28-Aug-1941, 76 y.o.   MRN: [id number] Subjective:  The patient is alert and pleasant. His wife is at the bedside. He looks and feels "much better".  Objective: Vital signs in last 24 hours: Temp:  [97.9 F (36.6 C)-99.5 F (37.5 C)] 97.9 F (36.6 C) (05/11 0501) Pulse Rate:  [72-82] 72 (05/11 0501) Resp:  [18] 18 (05/11 0501) BP: (110-137)/(57-91) 113/91 (05/11 0501) SpO2:  [94 %-100 %] 94 % (05/11 0501) Weight:  [109.9 kg (242 lb 3.2 oz)] 109.9 kg (242 lb 3.2 oz) (05/10 1648)  Intake/Output from previous day: 05/10 0701 - 05/11 0700 In: 240 [P.O.:240] Out: 250 [Urine:250] Intake/Output this shift: No intake/output data recorded.  Physical exam patient is alert and pleasant. His strength is normal except his left EHL is 4+ over 5.  Lab Results:  Recent Labs  03/17/17 0321  WBC 7.2  HGB 10.3*  HCT 32.2*  PLT 163   BMET  Recent Labs  03/17/17 0321  NA 138  K 4.4  CL 107  CO2 25  GLUCOSE 106*  BUN 20  CREATININE 2.09*  CALCIUM 8.5*    Studies/Results: Dg Lumbar Spine Complete  Result Date: 03/16/2017 CLINICAL DATA:  L5-S1 PLIF EXAM: DG C-ARM 61-120 MIN; LUMBAR SPINE - COMPLETE 4+ VIEW COMPARISON:  03/16/2017 FINDINGS: A posterior retractor is in place. Transpedicular screws have been placed at L5 and S1. Partially imaged vascular stents and coils. IMPRESSION: Intraoperative images during posterior fusion of L5-S1. Electronically Signed   By: Nolon Nations M.D.   On: 03/16/2017 19:55   Dg Lumbar Spine 1 View  Result Date: 03/16/2017 CLINICAL DATA:  L5-S1 posterior fusion. EXAM: LUMBAR SPINE - 1 VIEW COMPARISON:  CT scan of November 16, 2016. FINDINGS: Single intraoperative cross-table lateral projection of the lumbar spine demonstrates a surgical probe directed toward the posterior elements of L5. IMPRESSION: Surgical localization as described above. Electronically Signed   By: Marijo Conception, M.D.   On: 03/16/2017  16:01   Dg C-arm 1-60 Min  Result Date: 03/16/2017 CLINICAL DATA:  L5-S1 PLIF EXAM: DG C-ARM 61-120 MIN; LUMBAR SPINE - COMPLETE 4+ VIEW COMPARISON:  03/16/2017 FINDINGS: A posterior retractor is in place. Transpedicular screws have been placed at L5 and S1. Partially imaged vascular stents and coils. IMPRESSION: Intraoperative images during posterior fusion of L5-S1. Electronically Signed   By: Nolon Nations M.D.   On: 03/16/2017 19:55    Assessment/Plan: Postop day #2: The patient is doing much better today. Arrangements are being made for him to go to a skilled nursing facility. I gave the patient and his wife discharge instructions and answered all their questions. We will see how he does when he mobilizes today.  LOS: 2 days     Gorge Almanza D 03/18/2017, 7:54 AM

## 2017-03-18 NOTE — Clinical Social Work Note (Signed)
Clinical Social Work Assessment  Patient Details  Name: Rick Mcbride. MRN: [id number] Date of Birth: Sep 03, 1941  Date of referral:  03/18/17               Reason for consult:  Facility Placement                Permission sought to share information with:  Facility Sport and exercise psychologist, Family Supports Permission granted to share information::  Yes, Verbal Permission Granted  Name::     Rick Mcbride::  SNFs  Relationship::  SPouse  Contact Information:  [phone]  Housing/Transportation Living arrangements for the past 2 months:  Single Family Home Source of Information:  Patient, Spouse Patient Interpreter Needed:  None Criminal Activity/Legal Involvement Pertinent to Current Situation/Hospitalization:  No - Comment as needed Significant Relationships:  Spouse, Adult Children Lives with:  Spouse Do you feel safe going back to the place where you live?  No Need for family participation in patient care:  No (Coment)  Care giving concerns:  CSW received consult for possible SNF placement at time of discharge. CSW spoke with patient and patient's spouse regarding PT recommendation of SNF placement at time of discharge. Patient reported that patient's spouse is currently unable to care for patient at their home given patient's current physical needs and fall risk. Patient expressed understanding of PT recommendation and is agreeable to SNF placement at time of discharge. CSW to continue to follow and assist with discharge planning needs.   Social Worker assessment / plan:  CSW spoke with patient concerning possibility of rehab at [facility] before returning home.  Employment status:  Retired Nurse, adult PT Recommendations:  [place] / Referral to community resources:  [place]  Patient/Family's Response to care:  Patient recognizes need for rehab before returning home and is agreeable to a SNF in [place]. Patient reported preference for [facility].  Patient/Family's Understanding of and Emotional Response to Diagnosis, Current Treatment, and Prognosis:  Patient/family is realistic regarding therapy needs and expressed being hopeful for SNF placement. Patient expressed understanding of CSW role and discharge process as well as his medical condition. He reports being in a lot of pain. No questions/concerns about plan or treatment.    Emotional Assessment Appearance:  Appears stated age Attitude/Demeanor/Rapport:  Other (Appropriate) Affect (typically observed):  Accepting, Appropriate Orientation:  Oriented to Self, Oriented to Situation, Oriented to Place, Oriented to  Time Alcohol / Substance use:  Not Applicable Psych involvement (Current and /or in the community):  No (Comment)  Discharge Needs  Concerns to be addressed:  Care Coordination Readmission within the last 30 days:  No Current discharge risk:  None Barriers to Discharge:  Continued Medical Work up   [employer], [place] 03/18/2017, 8:56 AM

## 2017-03-18 NOTE — Care Management Note (Signed)
Case Management Note  Patient Details  Name: Rick Mcbride. MRN: [id number] Date of Birth: [birth year]  Subjective/Objective:     Pt underwent: POSTERIOR LUMBAR INTERBODY FUSION, INTERBODY PROSTHESIS, POSTERIOR LATERAL ARTHRODESIS, POSTERIOR NON-SEGMENTAL INSTRUMENTATION LUMBAR FIVE- SACRAL ONE. He is from home with his spouse.                Action/Plan: PT recommending SNF. CSW following and plan is for [facility] when medically ready. No further needs per CM.   Expected Discharge Date:                  Expected Discharge Plan:  Skilled Nursing Facility  In-House Referral:  Clinical Social Work  Discharge planning Services     Post Acute Care Choice:    Choice offered to:     DME Arranged:    DME Agency:     HH Arranged:    [place] Agency:     Status of Service:  In process, will continue to follow  If discussed at Long Length of Stay Meetings, dates discussed:    Additional Comments:  Pollie Friar, RN 03/18/2017, 1:12 PM

## 2017-03-18 NOTE — Progress Notes (Signed)
Physical Therapy Treatment Patient Details Name: Rick Mcbride. MRN: [id number] DOB: [birth year] Today's Date: 03/18/2017    History of Present Illness 76 y.o. M S/P L5-S1 PLIF. PMH including recent UTI, AAA repair, CKD, CAD w/ bypass, prostate cancer, and chronic back pain. Pt reports he recently had vascular Sx recently to improve blood flow and he attributes that to his UTI.    PT Comments    Pt progressing towards physical therapy goals. Was able to perform transfers and ambulation with +2 assist this session - initially for physical assist and later in session for safety/chair follow. Pt with continued difficulty with RLE control, and noted slight foot drop this session. Discussed d/c planning with pt and wife at length at end of session and wife reports concern with the amount of physical assistance required at this time. PT recommendation for continued therapy at the SNF level remains appropriate at this time. Will continue to follow and progress as able per POC.    Follow Up Recommendations  SNF;Supervision/Assistance - 24 hour     Equipment Recommendations  None recommended by PT    Recommendations for Other Services       Precautions / Restrictions Precautions Precautions: Back;Fall Precaution Booklet Issued: Yes (comment) Precaution Comments: Pt educated on back precautions and handout reviewed. Required Braces or Orthoses: Spinal Brace Spinal Brace: Lumbar corset;Applied in sitting position Restrictions Weight Bearing Restrictions: No    Mobility  Bed Mobility Overal bed mobility: Needs Assistance Bed Mobility: Rolling;Sidelying to Sit Rolling: Supervision Sidelying to sit: Min guard       General bed mobility comments: HOB slightly elevated and railing utilized. Pt was able to transition to EOB with proper log roll technique and close guard for safety and trunk support as pt achieved full sitting.   Transfers Overall transfer level: Needs  assistance Equipment used: Rolling walker (2 wheeled) Transfers: Sit to/from Stand Sit to Stand: Min assist;+2 physical assistance;+2 safety/equipment         General transfer comment: Increased time required to get in proper position at EOB before initiating sit<>stand. VC's for hand placement on seated surface for safety. Initially pt required +2 assist to power-up to full standing position, but on second attempt later in session pt was able to complete with +2 for safety only.   Ambulation/Gait Ambulation/Gait assistance: Min assist;+2 safety/equipment Ambulation Distance (Feet): 50 Feet (x2) Assistive device: Rolling walker (2 wheeled) Gait Pattern/deviations: Step-to pattern;Step-through pattern;Decreased stride length;Trunk flexed;Narrow base of support Gait velocity: Decreased Gait velocity interpretation: Below normal speed for age/gender General Gait Details: Pre-gait activity initially prior to stepping away from the bed. Pt with difficulty weight shifting and coordiantion of advancement of LE's initially. VC's for improved posture, sequencing, and general safety with the RW. Pt with narrow BOS and difficulty advancing LE's due to knee buckle. Slight foot drop noted. Chair follow utilized for safety.    Stairs            Wheelchair Mobility    Modified Rankin (Stroke Patients Only)       Balance Overall balance assessment: Needs assistance Sitting-balance support: Feet supported;Single extremity supported Sitting balance-Leahy Scale: Fair     Standing balance support: Bilateral upper extremity supported Standing balance-Leahy Scale: Poor Standing balance comment: L lateral lean due to R LE pain.                             Cognition Arousal/Alertness: Awake/alert Behavior During  Therapy: Anxious Overall Cognitive Status: Within Functional Limits for tasks assessed                                        Exercises      General  Comments        Pertinent Vitals/Pain Pain Assessment: 0-10 Pain Score: 6  Pain Location: R leg worse than back/incision site Pain Descriptors / Indicators: Aching;Sharp;Operative site guarding Pain Intervention(s): Limited activity within patient's tolerance;Monitored during session;Repositioned    Home Living                      Prior Function            PT Goals (current goals can now be found in the care plan section) Acute Rehab PT Goals Patient Stated Goal: pt did not state a goal PT Goal Formulation: With patient/family Time For Goal Achievement: 03/31/17 Potential to Achieve Goals: Good Progress towards PT goals: Progressing toward goals    Frequency    Min 5X/week      PT Plan Current plan remains appropriate    Co-evaluation              AM-PAC PT "6 Clicks" Daily Activity  Outcome Measure  Difficulty turning over in bed (including adjusting bedclothes, sheets and blankets)?: Total Difficulty moving from lying on back to sitting on the side of the bed? : Total Difficulty sitting down on and standing up from a chair with arms (e.g., wheelchair, bedside commode, etc,.)?: Total Help needed moving to and from a bed to chair (including a wheelchair)?: Total Help needed walking in hospital room?: Total Help needed climbing 3-5 steps with a railing? : Total 6 Click Score: 6    End of Session Equipment Utilized During Treatment: Gait belt;Back brace Activity Tolerance: Patient limited by pain Patient left: in chair;with call bell/phone within reach;with chair alarm set;with family/visitor present Nurse Communication: Mobility status PT Visit Diagnosis: Other abnormalities of gait and mobility (R26.89);Muscle weakness (generalized) (M62.81);Pain Pain - Right/Left: Right Pain - part of body: Leg (and back)     Time: 0355-9741 PT Time Calculation (min) (ACUTE ONLY): 53 min  Charges:  $Gait Training: 23-37 mins $Neuromuscular Re-education:  23-37 mins                    G Codes:       Rolinda Roan, PT, DPT Acute Rehabilitation Services Pager: [place] 03/18/2017, 12:12 PM

## 2017-03-18 NOTE — NC FL2 (Signed)
[place] LEVEL OF CARE SCREENING TOOL     IDENTIFICATION  Patient Name: Rick Mcbride. Birthdate: 1940/12/07 Sex: male Admission Date (Current Location): 03/16/2017  [facility] and [place] Number:  Engineering geologist and Address:  [facility] [place], [place] [address] [place], [place] [address]      Provider Number: [id number]  Attending Physician Name and Address:  Newman Pies, MD  Relative Name and Phone Number:  [name] spouse [phone]    Current Level of Care: Hospital Recommended Level of Care: [place] Prior Approval Number:    Date Approved/Denied:   PASRR Number: [id number]  Discharge Plan: SNF    Current Diagnoses: Patient Active Problem List   Diagnosis Date Noted  . Spondylolisthesis of lumbosacral region 03/16/2017  . Low back pain 02/01/2017  . Escherichia coli (E. coli) infection 01/28/2017  . Acute on chronic renal failure ([place]) 01/28/2017  . Elevated troponin 01/28/2017  . Generalized weakness 01/28/2017  . Essential hypertension 01/28/2017  . UTI (urinary tract infection) 01/26/2017  . AAA (abdominal aortic aneurysm) without rupture ([place]) 11/23/2016  . Chronic obstructive pulmonary disease ([place]) 10/09/2015  . Personal history of diseases of skin or subcutaneous tissue 10/09/2015  . Malignant neoplasm of prostate ([place]) 10/09/2015  . Pure hypercholesterolemia 10/09/2015  . Sinoatrial node dysfunction ([place]) 10/09/2015  . History of surgical procedure 08/22/2015  . Status post total shoulder replacement 07/10/2015  . Arthritis of shoulder region, degenerative 06/10/2015  . Benign essential HTN 06/03/2015  . Absolute anemia 04/12/2015  . Atrophic kidney 04/12/2015  . Essential (primary) hypertension 04/12/2015  . Acid reflux 04/12/2015  . Cancer of lung ([place]) 04/12/2015  . CA of prostate ([place]) 04/12/2015  . H/O adenomatous polyp of colon 01/15/2015  . Temporary cerebral vascular dysfunction  11/21/2014  . Calcific shoulder tendinitis 08/01/2014  . Cervical nerve root disorder 04/07/2012    Orientation RESPIRATION BLADDER Height & Weight     Self, Time, Situation, Place  Normal Continent Weight: 242 lb 3.2 oz (109.9 kg) Height:  '5\' 11"'$  (180.3 cm)  BEHAVIORAL SYMPTOMS/MOOD NEUROLOGICAL BOWEL NUTRITION STATUS      Continent Diet (please see DC summary)  AMBULATORY STATUS COMMUNICATION OF NEEDS Skin   Extensive Assist Verbally Surgical wounds (closed incision on back)                       Personal Care Assistance Level of Assistance  Bathing, Feeding, Dressing Bathing Assistance: Maximum assistance Feeding assistance: Independent Dressing Assistance: Maximum assistance     Functional Limitations Info             SPECIAL CARE FACTORS FREQUENCY  PT (By licensed PT)     PT Frequency: 5x/week              Contractures      Additional Factors Info  Code Status, Allergies, Psychotropic Code Status Info: Full Allergies Info: NKA Psychotropic Info: Ambien         Current Medications (03/18/2017):  This is the current hospital active medication list Current Facility-Administered Medications  Medication Dose Route Frequency Provider Last Rate Last Dose  . 0.9 %  sodium chloride infusion   Intravenous Continuous Albertha Ghee, MD 10 mL/hr at 03/16/17 1231    . 0.9 %  sodium chloride infusion  250 mL Intravenous Continuous Newman Pies, MD      . acetaminophen (TYLENOL) tablet 650 mg  650 mg Oral Q4H PRN Newman Pies, MD  Or  . acetaminophen (TYLENOL) suppository 650 mg  650 mg Rectal Q4H PRN Newman Pies, MD      . amLODipine (NORVASC) tablet 5 mg  5 mg Oral Daily Newman Pies, MD   5 mg at 03/18/17 0855  . atorvastatin (LIPITOR) tablet 40 mg  40 mg Oral QHS Newman Pies, MD   40 mg at 03/17/17 2207  . bisacodyl (DULCOLAX) suppository 10 mg  10 mg Rectal Daily PRN Newman Pies, MD      . cyclobenzaprine (FLEXERIL) tablet  10 mg  10 mg Oral TID PRN Newman Pies, MD   10 mg at 03/17/17 0914  . docusate sodium (COLACE) capsule 100 mg  100 mg Oral BID Newman Pies, MD   100 mg at 03/18/17 0855  . donepezil (ARICEPT) tablet 5 mg  5 mg Oral QHS Newman Pies, MD   5 mg at 03/17/17 2207  . ferrous sulfate tablet 325 mg  325 mg Oral Q breakfast Newman Pies, MD   325 mg at 03/18/17 0854  . gabapentin (NEURONTIN) tablet 300 mg  300 mg Oral TID Newman Pies, MD   300 mg at 03/18/17 0854  . menthol-cetylpyridinium (CEPACOL) lozenge 3 mg  1 lozenge Oral PRN Newman Pies, MD       Or  . phenol [facility]) mouth spray 1 spray  1 spray Mouth/Throat PRN Newman Pies, MD      . morphine 4 MG/ML injection 4 mg  4 mg Intravenous Q2H PRN Newman Pies, MD   4 mg at 03/17/17 1251  . multivitamin with minerals tablet 1 tablet  1 tablet Oral Daily Newman Pies, MD   1 tablet at 03/18/17 0855  . ondansetron (ZOFRAN) tablet 4 mg  4 mg Oral Q6H PRN Newman Pies, MD       Or  . ondansetron [facility]) injection 4 mg  4 mg Intravenous Q6H PRN Newman Pies, MD      . oxyCODONE-acetaminophen (PERCOCET/ROXICET) 5-325 MG per tablet 1-2 tablet  1-2 tablet Oral Q4H PRN Newman Pies, MD   2 tablet at 03/18/17 0531  . pantoprazole (PROTONIX) EC tablet 40 mg  40 mg Oral Daily Newman Pies, MD   40 mg at 03/18/17 0854  . sodium chloride flush (NS) 0.9 % injection 3 mL  3 mL Intravenous Q12H Newman Pies, MD   3 mL at 03/18/17 0018  . sodium chloride flush (NS) 0.9 % injection 3 mL  3 mL Intravenous PRN Newman Pies, MD      . traMADol-acetaminophen [facility]) 37.5-325 MG per tablet 1 tablet  1 tablet Oral Q4H PRN Newman Pies, MD      . zolpidem Lorrin Mais) tablet 5 mg  5 mg Oral QHS Newman Pies, MD   5 mg at 03/17/17 2207     Discharge Medications: Please see discharge summary for a list of discharge medications.  Relevant Imaging Results:  Relevant Lab Results:   Additional  Information SNN: [id number]  Estanislado Emms, LCSW

## 2017-03-19 DIAGNOSIS — M4317 Spondylolisthesis, lumbosacral region: Secondary | ICD-10-CM | POA: Diagnosis not present

## 2017-03-19 DIAGNOSIS — J302 Other seasonal allergic rhinitis: Secondary | ICD-10-CM | POA: Diagnosis not present

## 2017-03-19 DIAGNOSIS — R52 Pain, unspecified: Secondary | ICD-10-CM | POA: Diagnosis not present

## 2017-03-19 DIAGNOSIS — E539 Vitamin B deficiency, unspecified: Secondary | ICD-10-CM | POA: Diagnosis not present

## 2017-03-19 DIAGNOSIS — M62838 Other muscle spasm: Secondary | ICD-10-CM | POA: Diagnosis not present

## 2017-03-19 DIAGNOSIS — E58 Dietary calcium deficiency: Secondary | ICD-10-CM | POA: Diagnosis not present

## 2017-03-19 DIAGNOSIS — E611 Iron deficiency: Secondary | ICD-10-CM | POA: Diagnosis not present

## 2017-03-19 DIAGNOSIS — J209 Acute bronchitis, unspecified: Secondary | ICD-10-CM | POA: Diagnosis not present

## 2017-03-19 DIAGNOSIS — K219 Gastro-esophageal reflux disease without esophagitis: Secondary | ICD-10-CM | POA: Diagnosis not present

## 2017-03-19 DIAGNOSIS — E785 Hyperlipidemia, unspecified: Secondary | ICD-10-CM | POA: Diagnosis not present

## 2017-03-19 DIAGNOSIS — M6281 Muscle weakness (generalized): Secondary | ICD-10-CM | POA: Diagnosis not present

## 2017-03-19 DIAGNOSIS — I10 Essential (primary) hypertension: Secondary | ICD-10-CM | POA: Diagnosis not present

## 2017-03-19 DIAGNOSIS — G4700 Insomnia, unspecified: Secondary | ICD-10-CM | POA: Diagnosis not present

## 2017-03-19 DIAGNOSIS — M549 Dorsalgia, unspecified: Secondary | ICD-10-CM | POA: Diagnosis not present

## 2017-03-19 DIAGNOSIS — M4320 Fusion of spine, site unspecified: Secondary | ICD-10-CM | POA: Diagnosis not present

## 2017-03-19 DIAGNOSIS — Z5189 Encounter for other specified aftercare: Secondary | ICD-10-CM | POA: Diagnosis not present

## 2017-03-19 MED ORDER — CYCLOBENZAPRINE HCL 10 MG PO TABS: 10.0000 mg | ORAL_TABLET | Freq: Three times a day (TID) | ORAL | 0 refills | Status: DC | PRN

## 2017-03-19 MED ORDER — GABAPENTIN 600 MG PO TABS: 300.0000 mg | ORAL_TABLET | Freq: Three times a day (TID) | ORAL | Status: DC

## 2017-03-19 MED ORDER — OXYCODONE-ACETAMINOPHEN 7.5-325 MG PO TABS: ORAL_TABLET | ORAL | 0 refills | Status: DC

## 2017-03-19 NOTE — Progress Notes (Signed)
Patient left with all belongings via [place], with no complaints.  Call report to facility attempted.

## 2017-03-19 NOTE — Progress Notes (Addendum)
LCSW covering for the weekend:  Discharge to SNF: [facility]  Patient will be discharged today per MD. Patient will go to facility by EMS in which LCSW has arranged. Patient going to room 714  RN to call report to:  [phone],  Ask for RN 700 hall Call and spoke with Broadus John with [facility] and notified of discharge.  Family to be updated regarding plan.  [name] contacted: spouse via phone. Insurance ha also been called and notified of discharge to begin authorization for placement.  Lane Hacker, MSW Clinical Social Work: [employer] Coverage for :  [phone]

## 2017-03-19 NOTE — Discharge Summary (Signed)
Physician Discharge Summary  Patient ID: Rick Mcbride. MRN: [id number] DOB/AGE: 02/06/1941 76 y.o.  Admit date: 03/16/2017 Discharge date: 03/19/2017  Admission Diagnoses:  Spondylolisthesis of lumbosacral region  Discharge Diagnoses:  Same Active Problems:   Spondylolisthesis of lumbosacral region   Discharged Condition: Stable  Hospital Course:  Rick Mcbride. is a 76 y.o. male who was admitted for the below procedure. There were no post operative complications. At time of discharge, pain was well controlled, ambulating with Pt/OT, tolerating po, voiding normal. Ready for discharge to SNF.  Treatments: Surgery - L5 Gill procedure/laminectomy/foraminotomies to decompress the bilateral L5 and S1 nerve roots(the work required to do this was in addition to the work required to do the posterior lumbar interbody fusion because of the patient's spinal stenosis, facet arthropathy. Etc. requiring a wide decompression of the nerve roots.); posterior nonsegmental instrumentation from L5 to S1 with globus titanium pedicle screws and rods; posterior lateral arthrodesis at L5-S1 with local morselized autograft bone and Kinnex bone graft extender.  Discharge Exam: Blood pressure 128/61, pulse 63, temperature 97.6 F (36.4 C), temperature source Oral, resp. rate 18, height 5\' 11"  (1.803 m), weight 109.9 kg (242 lb 3.2 oz), SpO2 100 %. Awake, alert, oriented Speech fluent, appropriate CN grossly intact 5/5 BUE/BLE with exception of left RHL 4+/5 Wound c/d/i  Disposition: SNF  Discharge Instructions    Call MD for:  difficulty breathing, headache or visual disturbances    Complete by:  As directed    Call MD for:  persistant dizziness or light-headedness    Complete by:  As directed    Call MD for:  redness, tenderness, or signs of infection (pain, swelling, redness, odor or green/yellow discharge around incision site)    Complete by:  As directed    Call MD for:  severe  uncontrolled pain    Complete by:  As directed    Call MD for:  temperature >100.4    Complete by:  As directed    Diet general    Complete by:  As directed    Driving Restrictions    Complete by:  As directed    Do not drive until given clearance.   Increase activity slowly    Complete by:  As directed    Lifting restrictions    Complete by:  As directed    Do not lift anything >10lbs. Avoid bending and twisting in awkward positions. Avoid bending at the back.     Allergies as of 03/19/2017      Reactions   No Known Allergies       Medication List    STOP taking these medications   oxyCODONE-acetaminophen 5-325 MG tablet Commonly known as:  PERCOCET/ROXICET Replaced by:  oxyCODONE-acetaminophen 7.5-325 MG tablet   traMADol-acetaminophen 37.5-325 MG tablet Commonly known as:  ULTRACET     TAKE these medications   ALLERGY RELIEF PO Take 1 tablet by mouth daily.   amLODipine 5 MG tablet Commonly known as:  NORVASC Take 1 tablet (5 mg total) by mouth daily.   aspirin EC 81 MG tablet Take 81 mg by mouth daily.   atorvastatin 40 MG tablet Commonly known as:  LIPITOR Take 40 mg by mouth at bedtime.   CALCIUM 600 + D PO Take 1 tablet by mouth daily.   CENTRUM SILVER ULTRA MENS Tabs Take 1 tablet by mouth daily.   PRESERVISION/LUTEIN Caps Take 1 capsule by mouth 2 (two) times daily.   clopidogrel 75 MG tablet  Commonly known as:  PLAVIX Take 75 mg by mouth daily.   cyclobenzaprine 10 MG tablet Commonly known as:  FLEXERIL Take 1 tablet (10 mg total) by mouth 3 (three) times daily as needed for muscle spasms.   docusate sodium 100 MG capsule Commonly known as:  COLACE Take 100 mg by mouth daily.   donepezil 5 MG tablet Commonly known as:  ARICEPT Take 5 mg by mouth at bedtime.   ferrous sulfate 160 (50 Fe) MG Tbcr SR tablet Commonly known as:  SLOW FE Take 1 tablet by mouth daily.   Fish Oil 1200 MG Caps Take 2 capsules by mouth daily.   omeprazole  20 MG capsule Commonly known as:  PRILOSEC Take 20 mg by mouth daily.   oxyCODONE-acetaminophen 7.5-325 MG tablet Commonly known as:  PERCOCET Take 1 tablet by mouth every 4 hours as needed for pain. Replaces:  oxyCODONE-acetaminophen 5-325 MG tablet   vitamin B-12 1000 MCG tablet Commonly known as:  CYANOCOBALAMIN Take 1,000 mcg by mouth daily.   zolpidem 10 MG tablet Commonly known as:  AMBIEN Take 10 mg by mouth at bedtime.      Follow-up Information    Tressie Stalker, MD Follow up.   Specialty:  Neurosurgery Why:  Call to schedule your first post operative visit Contact information: [address] [place] [address] [phone]           Signed: Alyson Ingles 03/19/2017, 8:54 AM

## 2017-03-19 NOTE — Progress Notes (Signed)
Pt seen and examined.  No issues overnight. Pain well controlled. Ambulating with minimal difficulties.  EXAM: Temp:  [97.3 F (36.3 C)-98.3 F (36.8 C)] 97.6 F (36.4 C) (05/12 0437) Pulse Rate:  [63-84] 63 (05/12 0437) Resp:  [16-18] 18 (05/12 0437) BP: (103-160)/(59-76) 128/61 (05/12 0437) SpO2:  [96 %-100 %] 100 % (05/12 0437) Intake/Output      05/11 0701 - 05/12 0700 05/12 0701 - 05/13 0700   P.O. 100    I.V. (mL/kg)     Total Intake(mL/kg) 100 (0.9)    Urine (mL/kg/hr) 1400 (0.5)    Total Output 1400     Net -1300           Awake and alert Follows commands throughout Full strength  Stable. Cleared for discharge. Dispo planning for SNF Continue current care Discharge orders in place & signed should bed be available today

## 2017-03-19 NOTE — Clinical Social Work Placement (Signed)
   CLINICAL SOCIAL WORK PLACEMENT  NOTE  Date:  03/19/2017  Patient Details  Name: Rick Mcbride. MRN: [id number] Date of Birth: 06-09-1941  Clinical Social Work is seeking post-discharge placement for this patient at the [place] level of care (*CSW will initial, date and re-position this form in  chart as items are completed):  Yes   Patient/family provided with [facility] Work Department's list of facilities offering this level of care within the geographic area requested by the patient (or if unable, by the patient's family).  Yes   Patient/family informed of their freedom to choose among providers that offer the needed level of care, that participate in Medicare, Medicaid or managed care program needed by the patient, have an available bed and are willing to accept the patient.  Yes   Patient/family informed of [facility]'s ownership interest in [facility] and [facility], as well as of the fact that they are under no obligation to receive care at these facilities.  PASRR submitted to EDS on       PASRR number received on       Existing PASRR number confirmed on 03/18/17     FL2 transmitted to all facilities in geographic area requested by pt/family on 03/18/17     FL2 transmitted to all facilities within larger geographic area on       Patient informed that his/her managed care company has contracts with or will negotiate with certain facilities, including the following:        Yes   Patient/family informed of bed offers received.  Patient chooses bed at [facility]     Physician recommends and patient chooses bed at [facility] [place] [facility]    Patient to be transferred to [facility] [place] on 03/19/17.  Patient to be transferred to facility by EMS     Patient family notified on 03/19/17 of transfer.  Name of family member notified:  family     PHYSICIAN Please sign FL2     Additional Comment:     _______________________________________________ Lilly [place], LCSW 03/19/2017, 9:23 AM

## 2017-03-19 NOTE — Discharge Instructions (Signed)
Spinal Fusion, Care After °These instructions give you information about caring for yourself after your procedure. Your doctor may also give you more specific instructions. Call your doctor if you have any problems or questions after your procedure. °Follow these instructions at home: °Medicines  °· Take over-the-counter and prescription medicines only as told by your doctor. These include any medicines for pain. °· Do not drive for 24 hours if you received a sedative. °· Do not drive or use heavy machinery while taking prescription pain medicine. °· If you were prescribed an antibiotic medicine, take it as told by your doctor. Do not stop taking the antibiotic even if you start to feel better. °Surgical Cut (Incision) Care  °· Follow instructions from your doctor about how to take care of your surgical cut. Make sure you: °¨ Wash your hands with soap and water before you change your bandage (dressing). If you cannot use soap and water, use hand sanitizer. °¨ Change your bandage as told by your doctor. °¨ Leave stitches (sutures), skin glue, or skin tape (adhesive) strips in place. They may need to stay in place for 2 weeks or longer. If tape strips get loose and curl up, you may trim the loose edges. Do not remove tape strips completely unless your doctor says it is okay. °· Keep your surgical cut clean and dry. Do not take baths, swim, or use a hot tub until your doctor says it is okay. °· Check your surgical cut and the area around it every day for: °¨ Redness. °¨ Swelling. °¨ Fluid. °Physical Activity  °· Return to your normal activities as told by your doctor. Ask your doctor what activities are safe for you. Rest and protect your back as much as you can. °· Follow instructions from your doctor about how to move. Use good posture to help your spine heal. °· Do not lift anything that is heavier than 8 lb (3.6 kg) or as told by your doctor until he or she says that it is safe. Do not lift anything over your  head. °· Do not twist or bend at the waist until your doctor says it is okay. °· Avoid pushing or pulling motions. °· Do not sit or lie down in the same position for long periods of time. °· Do not start to exercise until your doctor says it is okay. Ask your doctor what kinds of exercise you can do to make your back stronger. °General instructions  °· If you were given a brace, use it as told by your doctor. °· Wear compression stockings as told by your doctor. °· Do not use tobacco products. These include cigarettes, chewing tobacco, or e-cigarettes. If you need help quitting, ask your doctor. °· Keep all follow-up visits as told by your doctor. This is important. This includes any visits with your physical therapist, if this applies. °Contact a doctor if: °· Your pain gets worse. °· Your medicine does not help your pain. °· Your legs or feet become painful or swollen. °· Your surgical cut is red, swollen, or painful. °· You have fluid, blood, or pus coming from your surgical cut. °· You feel sick to your stomach (nauseous). °· You throw up (vomit). °· Your have weakness or loss of feeling (numbness) in your legs that is new or getting worse. °· You have a fever. °· You have trouble controlling when you pee (urinate) or poop (have a bowel movement). °Get help right away if: °· Your pain is very   bad. °· You have chest pain. °· You have trouble breathing. °· You start to have a cough. °These symptoms may be an emergency. Do not wait to see if the symptoms will go away. Get medical help right away. Call your local emergency services (911 in the [place] Do not drive yourself to the hospital. °This information is not intended to replace advice given to you by your health care provider. Make sure you discuss any questions you have with your health care provider. °Document Released: 02/18/2011 Document Revised: 06/22/2016 Document Reviewed: 04/09/2015 °Elsevier Interactive Patient Education © 2017 [employer] ° °

## 2017-03-19 NOTE — Progress Notes (Signed)
Attempt call report to [facility].

## 2017-03-22 DIAGNOSIS — Z8739 Personal history of other diseases of the musculoskeletal system and connective tissue: Secondary | ICD-10-CM | POA: Diagnosis not present

## 2017-03-22 DIAGNOSIS — K5900 Constipation, unspecified: Secondary | ICD-10-CM | POA: Diagnosis not present

## 2017-03-22 DIAGNOSIS — I10 Essential (primary) hypertension: Secondary | ICD-10-CM | POA: Diagnosis not present

## 2017-03-22 DIAGNOSIS — E785 Hyperlipidemia, unspecified: Secondary | ICD-10-CM | POA: Diagnosis not present

## 2017-03-22 DIAGNOSIS — Z8673 Personal history of transient ischemic attack (TIA), and cerebral infarction without residual deficits: Secondary | ICD-10-CM | POA: Diagnosis not present

## 2017-03-22 DIAGNOSIS — G8918 Other acute postprocedural pain: Secondary | ICD-10-CM | POA: Diagnosis not present

## 2017-03-26 ENCOUNTER — Encounter: Payer: Self-pay | Admitting: Emergency Medicine

## 2017-03-26 ENCOUNTER — Emergency Department: Payer: PPO

## 2017-03-26 ENCOUNTER — Emergency Department
Admission: EM | Admit: 2017-03-26 | Discharge: 2017-03-26 | Disposition: A | Discharge status: Home / Self Care | Payer: PPO | Attending: Emergency Medicine | Admitting: Emergency Medicine

## 2017-03-26 DIAGNOSIS — G459 Transient cerebral ischemic attack, unspecified: Secondary | ICD-10-CM | POA: Diagnosis not present

## 2017-03-26 DIAGNOSIS — I2510 Atherosclerotic heart disease of native coronary artery without angina pectoris: Secondary | ICD-10-CM | POA: Diagnosis not present

## 2017-03-26 DIAGNOSIS — Z85118 Personal history of other malignant neoplasm of bronchus and lung: Secondary | ICD-10-CM | POA: Diagnosis not present

## 2017-03-26 DIAGNOSIS — R509 Fever, unspecified: Secondary | ICD-10-CM | POA: Diagnosis not present

## 2017-03-26 DIAGNOSIS — Z7982 Long term (current) use of aspirin: Secondary | ICD-10-CM | POA: Insufficient documentation

## 2017-03-26 DIAGNOSIS — R05 Cough: Secondary | ICD-10-CM | POA: Diagnosis not present

## 2017-03-26 DIAGNOSIS — J449 Chronic obstructive pulmonary disease, unspecified: Secondary | ICD-10-CM | POA: Diagnosis not present

## 2017-03-26 DIAGNOSIS — F05 Delirium due to known physiological condition: Secondary | ICD-10-CM | POA: Diagnosis not present

## 2017-03-26 DIAGNOSIS — R4182 Altered mental status, unspecified: Secondary | ICD-10-CM | POA: Diagnosis not present

## 2017-03-26 DIAGNOSIS — N183 Chronic kidney disease, stage 3 (moderate): Secondary | ICD-10-CM | POA: Insufficient documentation

## 2017-03-26 DIAGNOSIS — Z743 Need for continuous supervision: Secondary | ICD-10-CM | POA: Diagnosis not present

## 2017-03-26 DIAGNOSIS — Z79899 Other long term (current) drug therapy: Secondary | ICD-10-CM | POA: Insufficient documentation

## 2017-03-26 DIAGNOSIS — R0600 Dyspnea, unspecified: Secondary | ICD-10-CM | POA: Insufficient documentation

## 2017-03-26 DIAGNOSIS — Z87891 Personal history of nicotine dependence: Secondary | ICD-10-CM | POA: Diagnosis not present

## 2017-03-26 DIAGNOSIS — S0990XA Unspecified injury of head, initial encounter: Secondary | ICD-10-CM | POA: Diagnosis not present

## 2017-03-26 DIAGNOSIS — I129 Hypertensive chronic kidney disease with stage 1 through stage 4 chronic kidney disease, or unspecified chronic kidney disease: Secondary | ICD-10-CM | POA: Insufficient documentation

## 2017-03-26 LAB — COMPREHENSIVE METABOLIC PANEL
ALT: 13 U/L — AB (ref 17–63)
AST: 19 U/L (ref 15–41)
Albumin: 3.4 g/dL — ABNORMAL LOW (ref 3.5–5.0)
Alkaline Phosphatase: 60 U/L (ref 38–126)
Anion gap: 10 (ref 5–15)
BUN: 30 mg/dL — AB (ref 6–20)
CHLORIDE: 101 mmol/L (ref 101–111)
CO2: 26 mmol/L (ref 22–32)
CREATININE: 2.14 mg/dL — AB (ref 0.61–1.24)
Calcium: 8.9 mg/dL (ref 8.9–10.3)
GFR calc Af Amer: 33 mL/min — ABNORMAL LOW (ref >60)
GFR calc non Af Amer: 28 mL/min — ABNORMAL LOW (ref >60)
Glucose, Bld: 124 mg/dL — ABNORMAL HIGH (ref 65–99)
POTASSIUM: 4.1 mmol/L (ref 3.5–5.1)
SODIUM: 137 mmol/L (ref 135–145)
Total Bilirubin: 0.9 mg/dL (ref 0.3–1.2)
Total Protein: 6.6 g/dL (ref 6.5–8.1)

## 2017-03-26 LAB — URINALYSIS, COMPLETE (UACMP) WITH MICROSCOPIC
Bacteria, UA: NONE SEEN
Bilirubin Urine: NEGATIVE
Glucose, UA: NEGATIVE mg/dL
HGB URINE DIPSTICK: NEGATIVE
Ketones, ur: NEGATIVE mg/dL
Leukocytes, UA: NEGATIVE
NITRITE: NEGATIVE
PH: 6 (ref 5.0–8.0)
Protein, ur: NEGATIVE mg/dL
SPECIFIC GRAVITY, URINE: 1.011 (ref 1.005–1.030)

## 2017-03-26 LAB — CBC WITH DIFFERENTIAL/PLATELET
BASOS PCT: 0 %
Basophils Absolute: 0 10*3/uL (ref 0–0.1)
EOS ABS: 0.1 10*3/uL (ref 0–0.7)
Eosinophils Relative: 1 %
HEMATOCRIT: 33.5 % — AB (ref 40.0–52.0)
HEMOGLOBIN: 11.3 g/dL — AB (ref 13.0–18.0)
LYMPHS ABS: 0.7 10*3/uL — AB (ref 1.0–3.6)
Lymphocytes Relative: 8 %
MCH: 29.1 pg (ref 26.0–34.0)
MCHC: 33.8 g/dL (ref 32.0–36.0)
MCV: 86.1 fL (ref 80.0–100.0)
MONOS PCT: 9 %
Monocytes Absolute: 0.9 10*3/uL (ref 0.2–1.0)
NEUTROS ABS: 8.2 10*3/uL — AB (ref 1.4–6.5)
NEUTROS PCT: 82 %
Platelets: 223 10*3/uL (ref 150–440)
RBC: 3.89 MIL/uL — AB (ref 4.40–5.90)
RDW: 14.7 % — ABNORMAL HIGH (ref 11.5–14.5)
WBC: 10 10*3/uL (ref 3.8–10.6)

## 2017-03-26 LAB — PROTIME-INR
INR: 0.94
PROTHROMBIN TIME: 12.6 s (ref 11.4–15.2)

## 2017-03-26 LAB — PROCALCITONIN

## 2017-03-26 LAB — LACTIC ACID, PLASMA: LACTIC ACID, VENOUS: 1.3 mmol/L (ref 0.5–1.9)

## 2017-03-26 LAB — BRAIN NATRIURETIC PEPTIDE: B NATRIURETIC PEPTIDE 5: 572 pg/mL — AB (ref 0.0–100.0)

## 2017-03-26 LAB — TROPONIN I
TROPONIN I: 0.03 ng/mL — AB (ref <0.03)
Troponin I: 0.03 ng/mL (ref <0.03)

## 2017-03-26 LAB — LIPASE, BLOOD: Lipase: 18 U/L (ref 11–51)

## 2017-03-26 MED ORDER — OXYCODONE-ACETAMINOPHEN 5-325 MG PO TABS
2.0000 | ORAL_TABLET | Freq: Once | ORAL | Status: AC
  Administered 2017-03-26: 2 via ORAL
  Filled 2017-03-26: qty 2

## 2017-03-26 MED ORDER — SODIUM CHLORIDE 0.9 % IV BOLUS (SEPSIS)
500.0000 mL | Freq: Once | INTRAVENOUS | Status: AC
  Administered 2017-03-26: 500 mL via INTRAVENOUS

## 2017-03-26 NOTE — ED Notes (Signed)
Dining services called for tray.

## 2017-03-26 NOTE — ED Provider Notes (Signed)
[facility] Provider Note   CSN: [id number] Arrival date & time: 03/26/17  0510     History   Chief Complaint Chief Complaint  Patient presents with  . Altered Mental Status    HPI Vuong T Mavryk Pino. is a 76 y.o. male.  HPI  Past Medical History:  Diagnosis Date  . AAA (abdominal aortic aneurysm) ([place])   . AAA (abdominal aortic aneurysm) without rupture ([place])   . Anemia   . Aneurysm ([place])    abd aortic  . Anxiety   . Atrophic kidney   . Cervical radiculopathy   . Chronic airway obstruction (HCC)    not aware of this  . Chronic kidney disease (CKD), stage III (moderate)    followed by Dr. Johnny Bridge  . Chronic tension headaches   . Coronary artery disease   . Coronary atherosclerosis of autologous vein bypass graft   . DDD (degenerative disc disease), lumbar   . Degenerative disc disease, lumbar    with lumbar radiculopathy  . Dyspnea   . Elbow fracture, left   . GERD (gastroesophageal reflux disease)   . H/O adenomatous polyp of colon   . H/O hemorrhoids   . H/O urticaria   . Headache   . Heart disease   . Hypercholesteremia   . Hyperlipidemia   . 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 Lucky Cowboy  . Lung cancer ([place])   . Lung cancer ([place])   . Meralgia paresthetica   . Meralgia paresthetica   . Neuralgia   . Osteoarthritis   . Osteoarthritis    s/p L knee surgery  . 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 Presence of permanent cardiac pacemaker   . Prostate cancer ([place])   . Second degree AV block    Followed by Dr. Nehemiah Massed  . Sinoatrial node dysfunction (HCC)   . Status post partial lobectomy of lung    bottom right   . Stroke ([place])   . TIA (transient ischemic attack)     Patient Active Problem List   Diagnosis Date Noted  . Spondylolisthesis of lumbosacral region 03/16/2017  . Low back pain 02/01/2017  . Escherichia coli (E. coli) infection 01/28/2017  .  Acute on chronic renal failure ([place]) 01/28/2017  . Elevated troponin 01/28/2017  . Generalized weakness 01/28/2017  . Essential hypertension 01/28/2017  . UTI (urinary tract infection) 01/26/2017  . AAA (abdominal aortic aneurysm) without rupture ([place]) 11/23/2016  . Chronic obstructive pulmonary disease ([place]) 10/09/2015  . Personal history of diseases of skin or subcutaneous tissue 10/09/2015  . Malignant neoplasm of prostate ([place]) 10/09/2015  . Pure hypercholesterolemia 10/09/2015  . Sinoatrial node dysfunction ([place]) 10/09/2015  . History of surgical procedure 08/22/2015  . Status post total shoulder replacement 07/10/2015  . Arthritis of shoulder region, degenerative 06/10/2015  . Benign essential HTN 06/03/2015  . Absolute anemia 04/12/2015  . Atrophic kidney 04/12/2015  . Essential (primary) hypertension 04/12/2015  . Acid reflux 04/12/2015  . Cancer of lung ([place]) 04/12/2015  . CA of prostate ([place]) 04/12/2015  . H/O adenomatous polyp of colon 01/15/2015  . Temporary cerebral vascular dysfunction 11/21/2014  . Calcific shoulder tendinitis 08/01/2014  . Cervical nerve root disorder 04/07/2012    Past Surgical History:  Procedure Laterality Date  . CATARACT EXTRACTION    . COLONOSCOPY    . COLONOSCOPY    . COLONOSCOPY WITH PROPOFOL  N/A 10/20/2015   Procedure: COLONOSCOPY WITH PROPOFOL;  Surgeon: Manya Silvas, MD;  Location: [facility] ENDOSCOPY;  Service: Endoscopy;  Laterality: N/A;  . CORONARY ARTERY BYPASS GRAFT     triple  . coronary atherosclerosis of autologous vein bypass graft    . EMBOLIZATION Right 12/27/2016   Procedure: Embolization;  Surgeon: Algernon Huxley, MD;  Location: [facility] CV LAB;  Service: Cardiovascular;  Laterality: Right;  . ENDOVASCULAR REPAIR/STENT GRAFT N/A 01/05/2017   Procedure: Endovascular Repair/Stent Graft;  Surgeon: Algernon Huxley, MD;  Location: [facility];  Service: Cardiovascular;  Laterality: N/A;  . EYE SURGERY Bilateral     cataract extraction  . JOINT REPLACEMENT     shoulder and knees  . KNEE ARTHROSCOPY    . LOBECTOMY  01/31/13   RLL w/squamous cell carcinoma lobectomy  . LUNG REMOVAL, PARTIAL  2014   right lower lobe  . PACEMAKER INSERTION    . PACEMAKER INSERTION  12/2012   Dual chanber pacemaker generator  . partial shoulder replacement Right   . POLYPECTOMY    . PROSTATECTOMY    . TOTAL KNEE ARTHROPLASTY Bilateral   . TOTAL SHOULDER ARTHROPLASTY Left 07/10/2015   Procedure: TOTAL SHOULDER ARTHROPLASTY;  Surgeon: [name], MD;  Location: [facility] ORS;  Service: Orthopedics;  Laterality: Left;  . TOTAL SHOULDER REPLACEMENT         Home Medications    Prior to Admission medications   Medication Sig Start Date End Date Taking? Authorizing Provider  amLODipine (NORVASC) 5 MG tablet Take 1 tablet (5 mg total) by mouth daily. 01/29/17  Yes Theodoro Grist, MD  amoxicillin (AMOXIL) 500 MG tablet Take 500 mg by mouth 2 (two) times daily. 03/25/17 03/31/17 Yes [provider]  aspirin EC 81 MG tablet Take 81 mg by mouth daily.    Yes [provider]  atorvastatin (LIPITOR) 40 MG tablet Take 40 mg by mouth at bedtime.  10/30/14  Yes [provider]  bisacodyl (DULCOLAX) 10 MG suppository Place 10 mg rectally daily as needed for moderate constipation.   Yes [provider]  Calcium Carbonate-Vitamin D3 (CALCIUM 600-D) 600-400 MG-UNIT TABS Take 1 tablet by mouth daily.   Yes [provider]  cetirizine (ZYRTEC) 10 MG tablet Take 10 mg by mouth daily.   Yes [provider]  clopidogrel (PLAVIX) 75 MG tablet Take 75 mg by mouth daily.  10/30/14  Yes [provider]  cyclobenzaprine (FLEXERIL) 10 MG tablet Take 1 tablet (10 mg total) by mouth 3 (three) times daily as needed for muscle spasms. 03/19/17  Yes Costella, Vista Mink, PA-C  docusate sodium (COLACE) 100 MG capsule Take 100 mg by mouth daily.   Yes [provider]  donepezil (ARICEPT) 5 MG  tablet Take 5 mg by mouth at bedtime.   Yes [provider]  ferrous sulfate (SLOW FE) 160 (50 FE) MG TBCR SR tablet Take 1 tablet by mouth daily.    Yes [provider]  Multiple Vitamin (MULTIVITAMIN WITH MINERALS) TABS tablet Take 1 tablet by mouth 2 (two) times daily.   Yes [provider]  Multiple Vitamins-Minerals (PRESERVISION/LUTEIN) CAPS Take 1 capsule by mouth 2 (two) times daily.    Yes [provider]  Omega-3 Fatty Acids (FISH OIL) 1200 MG CAPS Take 2 capsules by mouth daily.    Yes [provider]  omeprazole (PRILOSEC) 20 MG capsule Take 20 mg by mouth daily.  10/30/14  Yes [provider]  oxyCODONE (OXYCONTIN) 15 mg 12 hr tablet Take 15 mg by mouth every 12 (twelve) hours.   Yes [provider]  oxyCODONE-acetaminophen (PERCOCET) 7.5-325 MG tablet Take 1 tablet by mouth every 4 hours as needed for pain. 03/19/17  Yes Costella, Vista Mink, PA-C  predniSONE (DELTASONE) 20 MG tablet Take 40 mg by mouth daily with breakfast.   Yes [provider]  senna-docusate (SENOKOT-S) 8.6-50 MG tablet Take 2 tablets by mouth daily.   Yes [provider]  vitamin B-12 (CYANOCOBALAMIN) 1000 MCG tablet Take 1,000 mcg by mouth daily.   Yes [provider]  zolpidem (AMBIEN) 10 MG tablet Take 10 mg by mouth at bedtime.  10/30/14  Yes [provider]    Family History Family History  Problem Relation Age of Onset  . Heart attack Mother   . Heart attack Father   . Breast cancer Sister   . Asthma Sister     Social History Social History  Substance Use Topics  . Smoking status: Former Smoker    Packs/day: 1.50    Years: 45.00    Quit date: 04/07/2004  . Smokeless tobacco: Never Used  . Alcohol use No     Allergies   Patient has no known allergies.   Review of Systems Review of Systems   Physical Exam Updated Vital Signs BP 111/62   Pulse 83   Temp 98.3 F (36.8 C) (Oral)   Resp  19   Ht '5\' 11"'$  (1.803 m)   Wt 242 lb (109.8 kg)   SpO2 97%   BMI 33.75 kg/m   Physical Exam   ED Treatments / Results  Labs (all labs ordered are listed, but only abnormal results are displayed) Labs Reviewed  COMPREHENSIVE METABOLIC PANEL - Abnormal; Notable for the following:       Result Value   Glucose, Bld 124 (*)    BUN 30 (*)    Creatinine, Ser 2.14 (*)    Albumin 3.4 (*)    ALT 13 (*)    GFR calc non Af Amer 28 (*)    GFR calc Af Amer 33 (*)    All other components within normal limits  BRAIN NATRIURETIC PEPTIDE - Abnormal; Notable for the following:    B Natriuretic Peptide 572.0 (*)    All other components within normal limits  TROPONIN I - Abnormal; Notable for the following:    Troponin I 0.03 (*)    All other components within normal limits  CBC WITH DIFFERENTIAL/PLATELET - Abnormal; Notable for the following:    RBC 3.89 (*)    Hemoglobin 11.3 (*)    HCT 33.5 (*)    RDW 14.7 (*)    Neutro Abs 8.2 (*)    Lymphs Abs 0.7 (*)    All other components within normal limits  URINALYSIS, COMPLETE (UACMP) WITH MICROSCOPIC - Abnormal; Notable for the following:    Color, Urine YELLOW (*)    APPearance CLEAR (*)    Squamous Epithelial / LPF 0-5 (*)    All other components within normal limits  TROPONIN I - Abnormal; Notable for the following:    Troponin I 0.03 (*)    All other components within normal limits  CULTURE, BLOOD (ROUTINE X 2)  CULTURE, BLOOD (ROUTINE X 2)  URINE CULTURE  LACTIC ACID, PLASMA  LIPASE, BLOOD  PROCALCITONIN  PROTIME-INR    EKG  EKG Interpretation None       Radiology Ct Head Wo Contrast  Result Date:  03/26/2017 CLINICAL DATA:  Fall, confusion.  Possible TIA. EXAM: CT HEAD WITHOUT CONTRAST TECHNIQUE: Contiguous axial images were obtained from the base of the skull through the vertex without intravenous contrast. COMPARISON:  Head CTA 11/04/2014 FINDINGS: Brain: Generalized atrophy, stable from prior exam. Chronic small vessel  ischemia with mild progression. Unchanged small focal right parietal encephalomalacia with adjacent calcification. Remote lacunar infarct of left basal ganglia. Remote lacunar infarcts in the right cerebellum. No hemorrhage or evidence of acute ischemia. No mass effect or midline shift. Vascular: Atherosclerosis of skullbase vasculature without hyperdense vessel or abnormal calcification. Skull: No skull fracture or focal lesion. Sinuses/Orbits: Trace fluid in left side of sphenoid sinus. Remaining paranasal sinuses are well-aerated. Mastoid air cells are clear. Visualized orbits are unremarkable. Post bilateral cataract resection. Other: None. IMPRESSION: 1.  No acute intracranial abnormality. 2. Stable atrophy. Mild progression of chronic small vessel ischemia. Remote lacunar infarcts in left basal ganglia and right cerebellum. Electronically Signed   By: Jeb Levering M.D.   On: 03/26/2017 06:29   Dg Chest Port 1 View  Result Date: 03/26/2017 CLINICAL DATA:  Cough and fever EXAM: PORTABLE CHEST 1 VIEW COMPARISON:  Chest radiograph 01/26/2017 FINDINGS: There are median sternotomy wires and CABG markers again seen. There is a left chest wall AICD with leads in unchanged position. The cardiomediastinal silhouette remains enlarged. There is a small right pleural effusion and associated atelectasis. No focal consolidation. No pulmonary edema. IMPRESSION: Unchanged cardiomegaly with small right pleural effusion and associated atelectasis. Electronically Signed   By: Ulyses Jarred M.D.   On: 03/26/2017 05:58    Procedures Procedures (including critical care time)  Medications Ordered in ED Medications  sodium chloride 0.9 % bolus 500 mL (0 mLs Intravenous Stopped 03/26/17 0707)  oxyCODONE-acetaminophen (PERCOCET/ROXICET) 5-325 MG per tablet 2 tablet (2 tablets Oral Given 03/26/17 0743)     Initial Impression / Assessment and Plan / ED Course  I have reviewed the triage vital signs and the nursing  notes.  Pertinent labs & imaging results that were available during my care of the patient were reviewed by me and considered in my medical decision making (see chart for details).  Clinical Course as of Mar 26 1046  Sat Mar 26, 2017  2130 The patient has an unchanged chest x-ray from prior.  He has a slight elevation of BNP but is in no acute distress at this time and a chest x-ray was reassuring, I think of anything he needs some fluids because of some constipation since the surgery and some decreased oral intake.  His CT head is unremarkable and his labs are all reassuring with a baseline creatinine at this time.  I did the patient and his wife also about the troponin is 0.03.  We agreed on the plan for a second troponin and discharge with outpatient follow-up if it is unremarkable.At this time we are awaiting the results of the patient's urinalysis, repeat troponin, and pro-calcitonin which I suspect will be normal.  I am transferring emergency department care to Dr. Cinda Quest who will follow up on these results and discharge or otherwise disposition as appropriate.  [CF]  0730 Urinalysis is unremarkable with no evidence of acute infection  [CF]    Clinical Course User Index [CF] Hinda Kehr, MD    Patient's troponin at 10:30 which is when it was finally done is unchanged. Urinalysis is normal Signed. [employer] notices this note above 2 lines are the only part of the chart that I'm  responsible for the rest of it is Dr. Karma Greaser for some reason I cannot open a new note.  Final Clinical Impressions(s) / ED Diagnoses   Final diagnoses:  Altered mental status, unspecified altered mental status type    New Prescriptions New Prescriptions   No medications on file     Nena Polio, MD 03/26/17 1049

## 2017-03-26 NOTE — ED Notes (Signed)
Pt resting quietly on stretcher. Respirations equal and unlabored.  Family at bedside. Plan of care discussed with family.

## 2017-03-26 NOTE — ED Notes (Signed)
MD Karma Greaser made aware of pt's troponin 0.03.

## 2017-03-26 NOTE — Discharge Instructions (Signed)
As we discussed, your workup today was reassuring.  Though we do not know exactly what is causing your symptoms, it appears that you have no emergent medical condition at this time and that you are safe to go home and follow up as recommended in this paperwork.  There is no evidence to suggest you suffered from a TIA or stroke, and you have no evidence of an acute bacterial infection.  Please continue with all of your current medications as scheduled (including your morning medications).  Please return immediately to the Emergency Department if you develop any new or worsening symptoms that concern you.

## 2017-03-26 NOTE — ED Notes (Signed)
EDP discussing DC plans.

## 2017-03-26 NOTE — ED Provider Notes (Signed)
[facility] Emergency Department Provider Note  ____________________________________________   First MD Initiated Contact with Patient 03/26/17 [id number]     (approximate)  I have reviewed the triage vital signs and the nursing notes.   HISTORY  Chief Complaint Altered Mental Status    HPI Rick Mcbride. is a 77 y.o. male who is currently at [facility] for rehabilitation after lumbar spine surgery.  He presents by EMS for evaluation of acute onset confusion.  He does have a history of TIA and is on Plavix.  He was normal at 2 AM according to the facility but just prior to arrival he was found sitting on the floor.  He reports that he got up but felt dizzy and lowered himself to the ground.  Apparently he had some confusion about the situation and there was concern at the facility that he may be having a TIA.  EMS was called and found that he had a temperature of 100.4 oral.  He received 300 mL of normal saline by IV in route to the hospital.  Upon arrival to the emergency department he is alert and oriented 3 and states that his only complaint is the low back pain that has been consistent since his surgery.  He is in no acute distress and denies chest pain, shortness of breath, nausea, vomiting, abdominal pain, dysuria.  He was recently started on amoxicillin and prednisone for a cough that has developed over the last few days.  His symptoms were reportedly acute in onset, moderate, and nothing in particular made it better or worse but he is now at his baseline.   Past Medical History:  Diagnosis Date  . AAA (abdominal aortic aneurysm) ([place])   . AAA (abdominal aortic aneurysm) without rupture ([place])   . Anemia   . Aneurysm ([place])    abd aortic  . Anxiety   . Atrophic kidney   . Cervical radiculopathy   . Chronic airway obstruction (HCC)    not aware of this  . Chronic kidney disease (CKD), stage III (moderate)    followed by Dr. Johnny Bridge  . Chronic  tension headaches   . Coronary artery disease   . Coronary atherosclerosis of autologous vein bypass graft   . DDD (degenerative disc disease), lumbar   . Degenerative disc disease, lumbar    with lumbar radiculopathy  . Dyspnea   . Elbow fracture, left   . GERD (gastroesophageal reflux disease)   . H/O adenomatous polyp of colon   . H/O hemorrhoids   . H/O urticaria   . Headache   . Heart disease   . Hypercholesteremia   . Hyperlipidemia   . 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 Lucky Cowboy  . Lung cancer ([place])   . Lung cancer ([place])   . Meralgia paresthetica   . Meralgia paresthetica   . Neuralgia   . Osteoarthritis   . Osteoarthritis    s/p L knee surgery  . 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 Presence of permanent cardiac pacemaker   . Prostate cancer ([place])   . Second degree AV block    Followed by Dr. Nehemiah Massed  . Sinoatrial node dysfunction (HCC)   . Status post partial lobectomy of lung    bottom right   . Stroke ([place])   . TIA (transient ischemic attack)     Patient Active  Problem List   Diagnosis Date Noted  . Spondylolisthesis of lumbosacral region 03/16/2017  . Low back pain 02/01/2017  . Escherichia coli (E. coli) infection 01/28/2017  . Acute on chronic renal failure ([place]) 01/28/2017  . Elevated troponin 01/28/2017  . Generalized weakness 01/28/2017  . Essential hypertension 01/28/2017  . UTI (urinary tract infection) 01/26/2017  . AAA (abdominal aortic aneurysm) without rupture ([place]) 11/23/2016  . Chronic obstructive pulmonary disease ([place]) 10/09/2015  . Personal history of diseases of skin or subcutaneous tissue 10/09/2015  . Malignant neoplasm of prostate ([place]) 10/09/2015  . Pure hypercholesterolemia 10/09/2015  . Sinoatrial node dysfunction ([place]) 10/09/2015  . History of surgical procedure 08/22/2015  . Status post total shoulder replacement 07/10/2015  . Arthritis of  shoulder region, degenerative 06/10/2015  . Benign essential HTN 06/03/2015  . Absolute anemia 04/12/2015  . Atrophic kidney 04/12/2015  . Essential (primary) hypertension 04/12/2015  . Acid reflux 04/12/2015  . Cancer of lung ([place]) 04/12/2015  . CA of prostate ([place]) 04/12/2015  . H/O adenomatous polyp of colon 01/15/2015  . Temporary cerebral vascular dysfunction 11/21/2014  . Calcific shoulder tendinitis 08/01/2014  . Cervical nerve root disorder 04/07/2012    Past Surgical History:  Procedure Laterality Date  . CATARACT EXTRACTION    . COLONOSCOPY    . COLONOSCOPY    . COLONOSCOPY WITH PROPOFOL N/A 10/20/2015   Procedure: COLONOSCOPY WITH PROPOFOL;  Surgeon: Manya Silvas, MD;  [facility];  Service: Endoscopy;  Laterality: N/A;  . CORONARY ARTERY BYPASS GRAFT     triple  . coronary atherosclerosis of autologous vein bypass graft    . EMBOLIZATION Right 12/27/2016   Procedure: Embolization;  Surgeon: Algernon Huxley, MD;  Location: [facility];  Service: Cardiovascular;  Laterality: Right;  . ENDOVASCULAR REPAIR/STENT GRAFT N/A 01/05/2017   Procedure: Endovascular Repair/Stent Graft;  Surgeon: Algernon Huxley, MD;  Location: [facility];  Service: Cardiovascular;  Laterality: N/A;  . EYE SURGERY Bilateral    cataract extraction  . JOINT REPLACEMENT     shoulder and knees  . KNEE ARTHROSCOPY    . LOBECTOMY  01/31/13   RLL w/squamous cell carcinoma lobectomy  . LUNG REMOVAL, PARTIAL  2014   right lower lobe  . PACEMAKER INSERTION    . PACEMAKER INSERTION  12/2012   Dual chanber pacemaker generator  . partial shoulder replacement Right   . POLYPECTOMY    . PROSTATECTOMY    . TOTAL KNEE ARTHROPLASTY Bilateral   . TOTAL SHOULDER ARTHROPLASTY Left 07/10/2015   Procedure: TOTAL SHOULDER ARTHROPLASTY;  Surgeon: [name], MD;  Location: [facility] ORS;  Service: Orthopedics;  Laterality: Left;  . TOTAL SHOULDER REPLACEMENT      Prior to Admission  medications   Medication Sig Start Date End Date Taking? Authorizing Provider  amLODipine (NORVASC) 5 MG tablet Take 1 tablet (5 mg total) by mouth daily. 01/29/17  Yes Theodoro Grist, MD  amoxicillin (AMOXIL) 500 MG tablet Take 500 mg by mouth 2 (two) times daily. 03/25/17 03/31/17 Yes [provider]  aspirin EC 81 MG tablet Take 81 mg by mouth daily.    Yes [provider]  atorvastatin (LIPITOR) 40 MG tablet Take 40 mg by mouth at bedtime.  10/30/14  Yes [provider]  bisacodyl (DULCOLAX) 10 MG suppository Place 10 mg rectally daily as needed for moderate constipation.   Yes [provider]  Calcium Carbonate-Vitamin D3 (CALCIUM 600-D) 600-400 MG-UNIT TABS Take 1 tablet  by mouth daily.   Yes [provider]  cetirizine (ZYRTEC) 10 MG tablet Take 10 mg by mouth daily.   Yes [provider]  clopidogrel (PLAVIX) 75 MG tablet Take 75 mg by mouth daily.  10/30/14  Yes [provider]  cyclobenzaprine (FLEXERIL) 10 MG tablet Take 1 tablet (10 mg total) by mouth 3 (three) times daily as needed for muscle spasms. 03/19/17  Yes Costella, Vista Mink, PA-C  docusate sodium (COLACE) 100 MG capsule Take 100 mg by mouth daily.   Yes [provider]  donepezil (ARICEPT) 5 MG tablet Take 5 mg by mouth at bedtime.   Yes [provider]  ferrous sulfate (SLOW FE) 160 (50 FE) MG TBCR SR tablet Take 1 tablet by mouth daily.    Yes [provider]  Multiple Vitamin (MULTIVITAMIN WITH MINERALS) TABS tablet Take 1 tablet by mouth 2 (two) times daily.   Yes [provider]  Multiple Vitamins-Minerals (PRESERVISION/LUTEIN) CAPS Take 1 capsule by mouth 2 (two) times daily.    Yes [provider]  Omega-3 Fatty Acids (FISH OIL) 1200 MG CAPS Take 2 capsules by mouth daily.    Yes [provider]  omeprazole (PRILOSEC) 20 MG capsule Take 20 mg by mouth daily.  10/30/14  Yes [provider]  oxyCODONE  (OXYCONTIN) 15 mg 12 hr tablet Take 15 mg by mouth every 12 (twelve) hours.   Yes [provider]  oxyCODONE-acetaminophen (PERCOCET) 7.5-325 MG tablet Take 1 tablet by mouth every 4 hours as needed for pain. 03/19/17  Yes Costella, Vista Mink, PA-C  predniSONE (DELTASONE) 20 MG tablet Take 40 mg by mouth daily with breakfast.   Yes [provider]  senna-docusate (SENOKOT-S) 8.6-50 MG tablet Take 2 tablets by mouth daily.   Yes [provider]  vitamin B-12 (CYANOCOBALAMIN) 1000 MCG tablet Take 1,000 mcg by mouth daily.   Yes [provider]  zolpidem (AMBIEN) 10 MG tablet Take 10 mg by mouth at bedtime.  10/30/14  Yes [provider]    Allergies Patient has no known allergies.  Family History  Problem Relation Age of Onset  . Heart attack Mother   . Heart attack Father   . Breast cancer Sister   . Asthma Sister     Social History Social History  Substance Use Topics  . Smoking status: Former Smoker    Packs/day: 1.50    Years: 45.00    Quit date: 04/07/2004  . Smokeless tobacco: Never Used  . Alcohol use No    Review of Systems Constitutional: No fever/chills although EMS reports a temperature of 100.4 Eyes: No visual changes. ENT: No sore throat. Cardiovascular: Denies chest pain. Respiratory: Denies shortness of breath, recent cough and recently started on amoxicillin and prednisone Gastrointestinal: No abdominal pain.  No nausea, no vomiting.  No diarrhea.  No constipation. Genitourinary: Negative for dysuria. Musculoskeletal: Negative for neck pain.  Lower back pain since his lumbar surgery Integumentary: Negative for rash. Neurological: Negative for headaches, focal weakness or numbness.   ____________________________________________   PHYSICAL EXAM:  VITAL SIGNS: ED Triage Vitals [03/26/17 0515]  Enc Vitals Group     BP      Pulse      Resp      Temp      Temp src      SpO2      Weight 242 lb (109.8 kg)      Height '5\' 11"'$  (1.803 m)  Head Circumference      [facility]      Pain Score 3     Pain Loc      Pain Edu?      Excl. in [place]?     Constitutional: Alert and oriented. Well appearing and in no acute distress. Eyes: Conjunctivae are normal.  Head: Atraumatic. Nose: No congestion/rhinnorhea. Mouth/Throat: Mucous membranes are moist. Neck: No stridor.  No meningeal signs.   Cardiovascular: Normal rate, regular rhythm. Good peripheral circulation. Grossly normal heart sounds. Respiratory: Normal respiratory effort.  No retractions. Lungs CTAB. Gastrointestinal: Soft and nontender. No distention.  Musculoskeletal: Well-appearing surgical wounds on his lumbar spine.  No surrounding erythema, induration, or fluctuance to suggest either cellulitis or an abscess.  No lower extremity tenderness nor edema. No gross deformities of extremities. Neurologic:  Normal speech and language. No gross focal neurologic deficits are appreciated.  He has no gross cranial nerve deficits, no dysmetria, equal grip strength bilaterally, and normal upper and lower muscle strength, although doing leg raises causes him some low back discomfort.  He is alert and oriented 3. Skin:  Skin is warm, dry and intact. No rash noted. Psychiatric: Mood and affect are normal. Speech and behavior are normal.   ____________________________________________   LABS (all labs ordered are listed, but only abnormal results are displayed)  Labs Reviewed  COMPREHENSIVE METABOLIC PANEL - Abnormal; Notable for the following:       Result Value   Glucose, Bld 124 (*)    BUN 30 (*)    Creatinine, Ser 2.14 (*)    Albumin 3.4 (*)    ALT 13 (*)    GFR calc non Af Amer 28 (*)    GFR calc Af Amer 33 (*)    All other components within normal limits  BRAIN NATRIURETIC PEPTIDE - Abnormal; Notable for the following:    B Natriuretic Peptide 572.0 (*)    All other components within normal limits  TROPONIN I - Abnormal; Notable for the  following:    Troponin I 0.03 (*)    All other components within normal limits  CBC WITH DIFFERENTIAL/PLATELET - Abnormal; Notable for the following:    RBC 3.89 (*)    Hemoglobin 11.3 (*)    HCT 33.5 (*)    RDW 14.7 (*)    Neutro Abs 8.2 (*)    Lymphs Abs 0.7 (*)    All other components within normal limits  URINALYSIS, COMPLETE (UACMP) WITH MICROSCOPIC - Abnormal; Notable for the following:    Color, Urine YELLOW (*)    APPearance CLEAR (*)    Squamous Epithelial / LPF 0-5 (*)    All other components within normal limits  CULTURE, BLOOD (ROUTINE X 2)  CULTURE, BLOOD (ROUTINE X 2)  URINE CULTURE  LACTIC ACID, PLASMA  LIPASE, BLOOD  PROCALCITONIN  PROTIME-INR  TROPONIN I   ____________________________________________  EKG  ED ECG REPORT I, Janyla Biscoe, the attending physician, personally viewed and interpreted this ECG.  Date: 03/26/2017 EKG Time: 5:19 AM Rate: 93 Rhythm: Patient has a pacemaker but also has some native beats QRS Axis: Right axis deviation Intervals: Prolonged PR interval at 270 ms ST/T Wave abnormalities: Non-specific ST segment / T-wave changes, but no evidence of acute ischemia. Conduction Disturbances: none Narrative Interpretation: unremarkable  ____________________________________________  RADIOLOGY   Ct Head Wo Contrast  Result Date: 03/26/2017 CLINICAL DATA:  Fall, confusion.  Possible TIA. EXAM: CT HEAD WITHOUT CONTRAST TECHNIQUE: Contiguous axial images were obtained from the  base of the skull through the vertex without intravenous contrast. COMPARISON:  Head CTA 11/04/2014 FINDINGS: Brain: Generalized atrophy, stable from prior exam. Chronic small vessel ischemia with mild progression. Unchanged small focal right parietal encephalomalacia with adjacent calcification. Remote lacunar infarct of left basal ganglia. Remote lacunar infarcts in the right cerebellum. No hemorrhage or evidence of acute ischemia. No mass effect or midline shift.  Vascular: Atherosclerosis of skullbase vasculature without hyperdense vessel or abnormal calcification. Skull: No skull fracture or focal lesion. Sinuses/Orbits: Trace fluid in left side of sphenoid sinus. Remaining paranasal sinuses are well-aerated. Mastoid air cells are clear. Visualized orbits are unremarkable. Post bilateral cataract resection. Other: None. IMPRESSION: 1.  No acute intracranial abnormality. 2. Stable atrophy. Mild progression of chronic small vessel ischemia. Remote lacunar infarcts in left basal ganglia and right cerebellum. Electronically Signed   By: Jeb Levering M.D.   On: 03/26/2017 06:29   Dg Chest Port 1 View  Result Date: 03/26/2017 CLINICAL DATA:  Cough and fever EXAM: PORTABLE CHEST 1 VIEW COMPARISON:  Chest radiograph 01/26/2017 FINDINGS: There are median sternotomy wires and CABG markers again seen. There is a left chest wall AICD with leads in unchanged position. The cardiomediastinal silhouette remains enlarged. There is a small right pleural effusion and associated atelectasis. No focal consolidation. No pulmonary edema. IMPRESSION: Unchanged cardiomegaly with small right pleural effusion and associated atelectasis. Electronically Signed   By: Ulyses Jarred M.D.   On: 03/26/2017 05:58    ____________________________________________   PROCEDURES  Critical Care performed: No   Procedure(s) performed:   Procedures   ____________________________________________   INITIAL IMPRESSION / ASSESSMENT AND PLAN / ED COURSE  Pertinent labs & imaging results that were available during my care of the patient were reviewed by me and considered in my medical decision making (see chart for details).  The patient reportedly had a fever of 100.4 by EMS but he is afebrile for [place] for treatment.  He is already on antibiotics for a cough and we will check a chest x-ray.  I will obtain a CT head without contrast given his history of TIA but I think this is unlikely.  He is  neurologically intact.  He may be experiencing some mild delirium due to the prednisone, his recent cough, medication changes, or simply due to the fact that [facility] is at [facility] and recovering from spinal surgery.  His surgical wounds are well appearing with no evidence of surrounding infection and an appropriate level of tenderness to palpation.  I will evaluate him broadly for evidence of infection or sepsis but I do think this is unlikely and will not give him any empiric antibiotics until we receive some laboratory results.  The patient is in agreement with this plan and is in no distress at this time.   Clinical Course as of Mar 26 732  Sat Mar 26, 2017  7741 The patient has an unchanged chest x-ray from prior.  He has a slight elevation of BNP but is in no acute distress at this time and a chest x-ray was reassuring, I think of anything he needs some fluids because of some constipation since the surgery and some decreased oral intake.  His CT head is unremarkable and his labs are all reassuring with a baseline creatinine at this time.  I did the patient and his wife also about the troponin is 0.03.  We agreed on the plan for a second troponin and discharge with outpatient follow-up if it is unremarkable.At  this time we are awaiting the results of the patient's urinalysis, repeat troponin, and pro-calcitonin which I suspect will be normal.  I am transferring emergency department care to Dr. Cinda Quest who will follow up on these results and discharge or otherwise disposition as appropriate.  [CF]  0730 Urinalysis is unremarkable with no evidence of acute infection  [CF]    Clinical Course User Index [CF] Hinda Kehr, MD    ____________________________________________  FINAL CLINICAL IMPRESSION(S) / ED DIAGNOSES  Final diagnoses:  Altered mental status, unspecified altered mental status type     MEDICATIONS GIVEN DURING THIS VISIT:  Medications  sodium chloride 0.9 % bolus 500 mL (0  mLs Intravenous Stopped 03/26/17 0707)     NEW OUTPATIENT MEDICATIONS STARTED DURING THIS VISIT:  New Prescriptions   No medications on file    Modified Medications   No medications on file    Discontinued Medications   CALCIUM CARB-CHOLECALCIFEROL (CALCIUM 600 + D PO)    Take 1 tablet by mouth daily.   CHLORPHENIRAMINE MALEATE (ALLERGY RELIEF PO)    Take 1 tablet by mouth daily.   MULTIPLE VITAMINS-MINERALS (CENTRUM SILVER ULTRA MENS) TABS    Take 1 tablet by mouth daily.      Note:  This document was prepared using Dragon voice recognition software and may include unintentional dictation errors.    Hinda Kehr, MD 03/26/17 [phone]

## 2017-03-26 NOTE — ED Notes (Signed)
Updated family on transportation status. Informed them that we do not know what time EMS will arrive for transport.

## 2017-03-26 NOTE — ED Triage Notes (Signed)
Patient brought in by ems from [facility]. Per facility patient became confused this morning. Patient has a history of TIA. Last know well 02:00 this am. Per ems patient has recently started on amoxicillin and prednisone for wheezing. Temperature by ems 100.4, bp 150/80, hr 90 and fsbs 142.

## 2017-03-27 LAB — URINE CULTURE

## 2017-03-29 DIAGNOSIS — N183 Chronic kidney disease, stage 3 (moderate): Secondary | ICD-10-CM | POA: Diagnosis not present

## 2017-03-29 DIAGNOSIS — Z8739 Personal history of other diseases of the musculoskeletal system and connective tissue: Secondary | ICD-10-CM | POA: Diagnosis not present

## 2017-03-29 DIAGNOSIS — I10 Essential (primary) hypertension: Secondary | ICD-10-CM | POA: Diagnosis not present

## 2017-03-29 DIAGNOSIS — E785 Hyperlipidemia, unspecified: Secondary | ICD-10-CM | POA: Diagnosis not present

## 2017-03-29 DIAGNOSIS — G8918 Other acute postprocedural pain: Secondary | ICD-10-CM | POA: Diagnosis not present

## 2017-03-29 DIAGNOSIS — K5900 Constipation, unspecified: Secondary | ICD-10-CM | POA: Diagnosis not present

## 2017-03-29 DIAGNOSIS — Z8673 Personal history of transient ischemic attack (TIA), and cerebral infarction without residual deficits: Secondary | ICD-10-CM | POA: Diagnosis not present

## 2017-03-31 LAB — CULTURE, BLOOD (ROUTINE X 2)
CULTURE: NO GROWTH
CULTURE: NO GROWTH
SPECIAL REQUESTS: ADEQUATE

## 2017-04-05 DIAGNOSIS — M4317 Spondylolisthesis, lumbosacral region: Secondary | ICD-10-CM | POA: Diagnosis not present

## 2017-04-12 ENCOUNTER — Encounter: Payer: Self-pay | Admitting: Physical Therapy

## 2017-04-12 ENCOUNTER — Ambulatory Visit: Payer: PPO | Attending: Neurosurgery | Admitting: Physical Therapy

## 2017-04-12 DIAGNOSIS — M545 Low back pain, unspecified: Secondary | ICD-10-CM

## 2017-04-12 DIAGNOSIS — M6281 Muscle weakness (generalized): Secondary | ICD-10-CM | POA: Diagnosis not present

## 2017-04-12 DIAGNOSIS — R262 Difficulty in walking, not elsewhere classified: Secondary | ICD-10-CM

## 2017-04-13 ENCOUNTER — Ambulatory Visit: Payer: PPO | Admitting: Physical Therapy

## 2017-04-13 NOTE — Therapy (Signed)
[facility] [address] [place], [address] Phone: [phone]   Fax:  [phone]  Physical Therapy Evaluation  Patient Details  Name: Rick Mcbride. MRN: [id number] Date of Birth: 03/29/1941 Referring Provider: Ophelia Charter MD  Encounter Date: 04/12/2017      PT End of Session - 04/12/17 1130    Visit Number 1   Number of Visits 16   Date for PT Re-Evaluation 06/07/17   Authorization Type 1   Authorization Time Period 10 (G code)   PT Start Time 1037   PT Stop Time 1130   PT Time Calculation (min) 53 min   Activity Tolerance Patient tolerated treatment well   Behavior During Therapy [facility] for tasks assessed/performed      Past Medical History:  Diagnosis Date  . AAA (abdominal aortic aneurysm) ([place])   . AAA (abdominal aortic aneurysm) without rupture ([place])   . Anemia   . Aneurysm ([place])    abd aortic  . Anxiety   . Atrophic kidney   . Cervical radiculopathy   . Chronic airway obstruction (HCC)    not aware of this  . Chronic kidney disease (CKD), stage III (moderate)    followed by Dr. Johnny Bridge  . Chronic tension headaches   . Coronary artery disease   . Coronary atherosclerosis of autologous vein bypass graft   . DDD (degenerative disc disease), lumbar   . Degenerative disc disease, lumbar    with lumbar radiculopathy  . Dyspnea   . Elbow fracture, left   . GERD (gastroesophageal reflux disease)   . H/O adenomatous polyp of colon   . H/O hemorrhoids   . H/O urticaria   . Headache   . Heart disease   . Hypercholesteremia   . Hyperlipidemia   . 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 Lucky Cowboy  . Lung cancer ([place])   . Lung cancer ([place])   . Meralgia paresthetica   . Meralgia paresthetica   . Neuralgia   . Osteoarthritis   . Osteoarthritis    s/p L knee surgery  . 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 Presence of permanent cardiac pacemaker   . Prostate cancer ([place])   . Second degree AV block    Followed by Dr. Nehemiah Massed  . Sinoatrial node dysfunction (HCC)   . Status post partial lobectomy of lung    bottom right   . Stroke ([place])   . TIA (transient ischemic attack)     Past Surgical History:  Procedure Laterality Date  . CATARACT EXTRACTION    . COLONOSCOPY    . COLONOSCOPY    . COLONOSCOPY WITH PROPOFOL N/A 10/20/2015   Procedure: COLONOSCOPY WITH PROPOFOL;  Surgeon: Manya Silvas, MD;  Location: [facility] ENDOSCOPY;  Service: Endoscopy;  Laterality: N/A;  . CORONARY ARTERY BYPASS GRAFT     triple  . coronary atherosclerosis of autologous vein bypass graft    . EMBOLIZATION Right 12/27/2016   Procedure: Embolization;  Surgeon: Algernon Huxley, MD;  Location: [place] CV LAB;  Service: Cardiovascular;  Laterality: Right;  . ENDOVASCULAR REPAIR/STENT GRAFT N/A 01/05/2017   Procedure: Endovascular Repair/Stent Graft;  Surgeon: Algernon Huxley, MD;  Location: [place] LAB;  Service: Cardiovascular;  Laterality: N/A;  . EYE SURGERY Bilateral    cataract extraction  . JOINT REPLACEMENT  shoulder and knees  . KNEE ARTHROSCOPY    . LOBECTOMY  01/31/13   RLL w/squamous cell carcinoma lobectomy  . LUNG REMOVAL, PARTIAL  2014   right lower lobe  . PACEMAKER INSERTION    . PACEMAKER INSERTION  12/2012   Dual chanber pacemaker generator  . partial shoulder replacement Right   . POLYPECTOMY    . PROSTATECTOMY    . TOTAL KNEE ARTHROPLASTY Bilateral   . TOTAL SHOULDER ARTHROPLASTY Left 07/10/2015   Procedure: TOTAL SHOULDER ARTHROPLASTY;  Surgeon: [name], MD;  Location: [facility] ORS;  Service: Orthopedics;  Laterality: Left;  . TOTAL SHOULDER REPLACEMENT      There were no vitals filed for this visit.       Subjective Assessment - 04/12/17 1130    Subjective Patient reports he is s/p lumbar surgery 03/16/2017 with limitations of ROM, decreased function with ADLs,  difficulty with walking, decreased strength.   Pertinent History Patient has had back pain for years with multiple episodes of exacerbation of symptoms. He reports that most recent episode began summer 2017 following painting a 6' fence and the pain got progressively worse with weakness in LEs. He then has MRI and then surgery 03/16/2017. He has history of TIA with residual short term memory dysfunction and bilateral shoulder surgeries, bilateral TKR and aortic anuerysm and pacemaker.    Limitations Sitting;Lifting;Walking;Standing;House hold activities   How long can you sit comfortably? >30 min.   How long can you stand comfortably? no longer than 10 min   How long can you walk comfortably? walking with rolling walker short distances   Patient Stated Goals improve strength, walk without AD, return to prior level of function   Currently in Pain? Yes   Pain Score 4    Pain Location Back   Pain Orientation Lower   Pain Descriptors / Indicators Aching   Pain Type Acute pain;Surgical pain  03/16/2017   Pain Onset 1 to 4 weeks ago  03/16/2017   Pain Frequency Intermittent   Aggravating Factors  standing, walking   Pain Relieving Factors rest, medication   Effect of Pain on Daily Activities limited due to recent surgery, precautions            [facility] PT Assessment - 04/12/17 1047      Assessment   Medical Diagnosis Spondylolithesis, lumbosacral region:  s/p lumbar fusion 03/16/17   Referring Provider Ophelia Charter MD   Onset Date/Surgical Date 03/16/17   Hand Dominance Right   Next MD Visit unknown   Prior Therapy yes; skilled therapy x 2 weeks post op     Precautions   Precautions Back;Fall   Type of back Precautions lifting and ROM limitations per protocol    --   Required Braces or Orthoses Spinal Brace   Spinal Brace Lumbar corset;Applied in standing position     Restrictions   Weight Bearing Restrictions No   Other Position/Activity Restrictions      Balance Screen   Has  the patient fallen in the past 6 months No   Has the patient had a decrease in activity level because of a fear of falling?  No   Is the patient reluctant to leave their home because of a fear of falling?  No     Home Environment   Living Environment Private residence   Living Arrangements Spouse/significant other   Type of [place] Access Level entry   [place] One level  [place] - 2 wheels;Wheelchair - manual;Grab bars - toilet;Grab bars - tub/shower;Toilet riser;Shower seat - built in;Cane - single point     Prior Function   Level of Independence Independent   Vocation Retired   Leisure exercise, gardening, watch TV, family     Cognition   Overall Cognitive Status Within Functional Limits for tasks assessed   Memory Impaired   Memory Impairment Decreased short term memory   Decreased Short Term Memory Verbal complex     Observation/Other Assessments   Observations ambulating with rolling walker into clinic   Skin Integrity incision well healed lumbar spine   Modified [place] 58%     Sensation   Light Touch Appears Intact  patient reports numbness in left LE, improving since surgery     Posture/Postural Control   Posture/Postural Control Postural limitations; guarded posture with spasms in lumbar spine; limited assessment due to recent surgery     ROM and strength assessments deferred at this time due to patient not having back brace on (left it at home)   Objective measurements completed on examination: See above findings.          PT Education - 04/12/17 1126    Education provided Yes   Education Details POC, exercises for LE's, posture, use of back brace   Person(s) Educated Patient   Methods Explanation;Demonstration;Verbal cues;Handout   Comprehension Verbalized understanding;Returned demonstration;Verbal cues required             PT Long Term Goals - 04/12/17 1132      PT LONG TERM GOAL #1   Title  Patient  will demonstrate improved function with daily tasks and decreased back pain as indicated by MODI score of 40% or better by 05/09/2017    Baseline MODI 58%   Status New     PT LONG TERM GOAL #2   Title  Patient will demonstrate improved function with progressing towards prior level of funciton with daily tasks and decreased back pain as indicated by MODI score of 20% or better by 06/07/2017    Baseline MDI 58%   Status New     PT LONG TERM GOAL #3   Title Patient will demonstrate improved posture awareness and pain control strategies to allow patient to sit/stand for >30 min. by 05/22/2017   Baseline unable to sit or stand >10 - 30 min. without increased pain   Status New     PT LONG TERM GOAL #4   Title Patient will be independent with home program for posture awareness, pain control, progressive exercises to allow patient to transition to self management once discharged from physical therapy 06/07/2017   Baseline limited knowledge of exercises and progrression without guidance and instruction   Status New                Plan - 04/12/17 1130    Clinical Impression Statement Patient is a 76 year old right hand dominant male who presents s/p spinal surgery with fusion with difficulty walking, decreased strength in core, LE's and decreased function with daily task He has MODI score of 58% indicating moderate to severe self perceived disabilty. He has limited knoweldge of appropriate exercises, progression and will benefit from physical therapy intervention in order to return to prior level of function.    History and Personal Factors relevant to plan of care: long history of back pain with episodes throughout his life. history of TIA 2016, bilateral TKA, bilateral TSA, aortic anuerysm, pacemaker and other comorbidities which  will contribute to length of healing time.    Clinical Presentation Evolving   Clinical Presentation due to: s/p spinal fusion with numbness in left LE, weakness    Clinical Decision Making Moderate   Rehab Potential Good   Clinical Impairments Affecting Rehab Potential (+) acute condition s/p surgery, motivated, family support (-)co morbidities; shoulder surgeries, TKR bilateral, aortic anuerysm, pacemaker, CA   PT Frequency 2x / week   PT Duration 8 weeks   PT Treatment/Interventions Therapeutic exercise;Moist Heat;Patient/family education;Manual techniques;Gait training   PT Next Visit Plan progressive exercise, manual STM as needed   PT Home Exercise Plan ROM LE's seated ball rolling under foot, hip adduction, glute sets   Consulted and Agree with Plan of Care Patient      Patient will benefit from skilled therapeutic intervention in order to improve the following deficits and impairments:  Decreased strength, Decreased activity tolerance, Impaired perceived functional ability, Pain, Increased muscle spasms, Decreased endurance, Decreased range of motion, Difficulty walking  Visit Diagnosis: Muscle weakness (generalized) - Plan: PT plan of care cert/re-cert  Difficulty in walking, not elsewhere classified - Plan: PT plan of care cert/re-cert  Acute low back pain without sciatica, unspecified back pain laterality - Plan: PT plan of care cert/re-cert      G-Codes - 09/03/24 1130    Functional Assessment Tool Used (Outpatient Only) MODI, pain, strenth, ROM, clinical judgment   Functional Limitation Mobility: Walking and moving around   Mobility: Walking and Moving Around Current Status (D6644) At least 40 percent but less than 60 percent impaired, limited or restricted   Mobility: Walking and Moving Around Goal Status (I3474) At least 1 percent but less than 20 percent impaired, limited or restricted       Problem List Patient Active Problem List   Diagnosis Date Noted  . Spondylolisthesis of lumbosacral region 03/16/2017  . Low back pain 02/01/2017  . Escherichia coli (E. coli) infection 01/28/2017  . Acute on chronic renal failure ([place])  01/28/2017  . Elevated troponin 01/28/2017  . Generalized weakness 01/28/2017  . Essential hypertension 01/28/2017  . UTI (urinary tract infection) 01/26/2017  . AAA (abdominal aortic aneurysm) without rupture ([place]) 11/23/2016  . Chronic obstructive pulmonary disease ([place]) 10/09/2015  . Personal history of diseases of skin or subcutaneous tissue 10/09/2015  . Malignant neoplasm of prostate ([place]) 10/09/2015  . Pure hypercholesterolemia 10/09/2015  . Sinoatrial node dysfunction ([place]) 10/09/2015  . History of surgical procedure 08/22/2015  . Status post total shoulder replacement 07/10/2015  . Arthritis of shoulder region, degenerative 06/10/2015  . Benign essential HTN 06/03/2015  . Absolute anemia 04/12/2015  . Atrophic kidney 04/12/2015  . Essential (primary) hypertension 04/12/2015  . Acid reflux 04/12/2015  . Cancer of lung ([place]) 04/12/2015  . CA of prostate ([place]) 04/12/2015  . H/O adenomatous polyp of colon 01/15/2015  . Temporary cerebral vascular dysfunction 11/21/2014  . Calcific shoulder tendinitis 08/01/2014  . Cervical nerve root disorder 04/07/2012    [name] PT 04/13/2017, 5:25 PM  [facility] [address], [place], [address] Phone: [phone]   Fax:  [phone]  Name: Rick Mcbride. MRN: [id number] Date of Birth: Oct 16, 1941

## 2017-04-18 ENCOUNTER — Ambulatory Visit: Payer: PPO | Admitting: Physical Therapy

## 2017-04-18 ENCOUNTER — Encounter: Payer: Self-pay | Admitting: Physical Therapy

## 2017-04-18 ENCOUNTER — Encounter: Payer: PPO | Admitting: Physical Therapy

## 2017-04-18 DIAGNOSIS — I10 Essential (primary) hypertension: Secondary | ICD-10-CM | POA: Diagnosis not present

## 2017-04-18 DIAGNOSIS — Z79899 Other long term (current) drug therapy: Secondary | ICD-10-CM | POA: Diagnosis not present

## 2017-04-18 DIAGNOSIS — R262 Difficulty in walking, not elsewhere classified: Secondary | ICD-10-CM

## 2017-04-18 DIAGNOSIS — M5137 Other intervertebral disc degeneration, lumbosacral region: Secondary | ICD-10-CM | POA: Diagnosis not present

## 2017-04-18 DIAGNOSIS — N183 Chronic kidney disease, stage 3 (moderate): Secondary | ICD-10-CM | POA: Diagnosis not present

## 2017-04-18 DIAGNOSIS — R0602 Shortness of breath: Secondary | ICD-10-CM | POA: Diagnosis not present

## 2017-04-18 DIAGNOSIS — M545 Low back pain, unspecified: Secondary | ICD-10-CM

## 2017-04-18 DIAGNOSIS — M6281 Muscle weakness (generalized): Secondary | ICD-10-CM

## 2017-04-18 DIAGNOSIS — Z125 Encounter for screening for malignant neoplasm of prostate: Secondary | ICD-10-CM | POA: Diagnosis not present

## 2017-04-18 DIAGNOSIS — E782 Mixed hyperlipidemia: Secondary | ICD-10-CM | POA: Diagnosis not present

## 2017-04-18 DIAGNOSIS — R739 Hyperglycemia, unspecified: Secondary | ICD-10-CM | POA: Diagnosis not present

## 2017-04-18 NOTE — Therapy (Signed)
[facility] [address] [place], [address] Phone: [phone]   Fax:  [phone]  Physical Therapy Treatment  Patient Details  Name: Rick Mcbride. MRN: [id number] Date of Birth: [birth year] Referring Provider: Ophelia Charter MD  Encounter Date: 04/18/2017      PT End of Session - 04/18/17 0908    Visit Number 2   Number of Visits 16   Date for PT Re-Evaluation 06/07/17   Authorization Type 2   Authorization Time Period 10 (G code)   PT Start Time 0902   PT Stop Time 0945   PT Time Calculation (min) 43 min   Activity Tolerance Patient tolerated treatment well   Behavior During Therapy [facility] for tasks assessed/performed      Past Medical History:  Diagnosis Date  . AAA (abdominal aortic aneurysm) ([place])   . AAA (abdominal aortic aneurysm) without rupture ([place])   . Anemia   . Aneurysm ([place])    abd aortic  . Anxiety   . Atrophic kidney   . Cervical radiculopathy   . Chronic airway obstruction (HCC)    not aware of this  . Chronic kidney disease (CKD), stage III (moderate)    followed by Dr. Johnny Bridge  . Chronic tension headaches   . Coronary artery disease   . Coronary atherosclerosis of autologous vein bypass graft   . DDD (degenerative disc disease), lumbar   . Degenerative disc disease, lumbar    with lumbar radiculopathy  . Dyspnea   . Elbow fracture, left   . GERD (gastroesophageal reflux disease)   . H/O adenomatous polyp of colon   . H/O hemorrhoids   . H/O urticaria   . Headache   . Heart disease   . Hypercholesteremia   . Hyperlipidemia   . 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 Lucky Cowboy  . Lung cancer ([place])   . Lung cancer ([place])   . Meralgia paresthetica   . Meralgia paresthetica   . Neuralgia   . Osteoarthritis   . Osteoarthritis    s/p L knee surgery  . 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 Presence of permanent cardiac pacemaker   . Prostate cancer ([place])   . Second degree AV block    Followed by Dr. Nehemiah Massed  . Sinoatrial node dysfunction (HCC)   . Status post partial lobectomy of lung    bottom right   . Stroke ([place])   . TIA (transient ischemic attack)     Past Surgical History:  Procedure Laterality Date  . CATARACT EXTRACTION    . COLONOSCOPY    . COLONOSCOPY    . COLONOSCOPY WITH PROPOFOL N/A 10/20/2015   Procedure: COLONOSCOPY WITH PROPOFOL;  Surgeon: Manya Silvas, MD;  Location: [facility] ENDOSCOPY;  Service: Endoscopy;  Laterality: N/A;  . CORONARY ARTERY BYPASS GRAFT     triple  . coronary atherosclerosis of autologous vein bypass graft    . EMBOLIZATION Right 12/27/2016   Procedure: Embolization;  Surgeon: Algernon Huxley, MD;  Location: [place] CV LAB;  Service: Cardiovascular;  Laterality: Right;  . ENDOVASCULAR REPAIR/STENT GRAFT N/A 01/05/2017   Procedure: Endovascular Repair/Stent Graft;  Surgeon: Algernon Huxley, MD;  Location: [place] CV LAB;  Service: Cardiovascular;  Laterality: N/A;  . EYE SURGERY Bilateral    cataract extraction  . JOINT REPLACEMENT  shoulder and knees  . KNEE ARTHROSCOPY    . LOBECTOMY  01/31/13   RLL w/squamous cell carcinoma lobectomy  . LUNG REMOVAL, PARTIAL  2014   right lower lobe  . PACEMAKER INSERTION    . PACEMAKER INSERTION  12/2012   Dual chanber pacemaker generator  . partial shoulder replacement Right   . POLYPECTOMY    . PROSTATECTOMY    . TOTAL KNEE ARTHROPLASTY Bilateral   . TOTAL SHOULDER ARTHROPLASTY Left 07/10/2015   Procedure: TOTAL SHOULDER ARTHROPLASTY;  Surgeon: [name], MD;  Location: [facility] ORS;  Service: Orthopedics;  Laterality: Left;  . TOTAL SHOULDER REPLACEMENT      There were no vitals filed for this visit.      Subjective Assessment - 04/18/17 0905    Subjective Patient reports he is controlling pain with less medication and he is improving with strength and endurance.     Pertinent History Patient has had back pain for years with multiple episodes of exacerbation of symptoms. He reports that most recent episode began summer 2017 following painting a 6' fence and the pain got progressively worse with weakness in LEs. He then has MRI and then surgery 03/16/2017. He has history of TIA with residueal short term memory diysfunction and bilateral shoulder surgeries, bilateral TKR and aortic anuerysm and pacemaker.    Limitations Sitting;Lifting;Walking;Standing;House hold activities   How long can you sit comfortably? >30 min.   How long can you stand comfortably? no longer than 10 min   How long can you walk comfortably? walking with rolling walker short distances   Patient Stated Goals improve strength, walk without AD, return to prior level of function   Currently in Pain? Yes   Pain Score 4   on medication and with moving (no pain with resting and lying in bed)    Pain Location Back   Pain Descriptors / Indicators Aching   Pain Type Acute pain  03/16/2017   Pain Onset More than a month ago  03/16/2017   Pain Frequency Intermittent     Objective: Gait; ambulating independently with rolling walker (front wheeled) and back brace support in place; steppage gait left LE; verbally cued patient to slow pace to improve motor control left LE  Treatment: Therapeutic exercise: patient performed with VC, tactile cues and demonstration of therapist: back brace in place throughout session: Walking x 3 min. With walker; (attempted 36min. And had to rest after 3) with close supervision of therapist with cuing to control left LE placement and slow cadence to improve endurance Balance system for weight shifting forward/back and side to side x 2 min. Each with UE support for balance and with VC and close supervision for safety patient verbalized feeling tired/fatigued following exercise Sitting: on treatment table with back unsupported back brace in place for all exercises Roll ball  under each foot with 3# cuff weight at ankle 30 reps each Knee extension through partial ROM x 15 Green resistive band knee flexion x 20 reps each LE  Patient response to treatment: patient demonstrated improved technique with exercises with moderate cuing, verbal and demonstration for correct alignment.  Improved motor control with repetition and cuing; fatigue limited exercises performed and he required rest breaks between exercises of walking, standing.            PT Education - 04/18/17 0907    Education provided Yes   Education Details HEP stabilization exercises; modified exercises due to decreased endurance   Person(s) Educated Patient  Methods Explanation;Demonstration;Verbal cues   Comprehension Returned demonstration;Verbalized understanding;Verbal cues required             PT Long Term Goals - 04/12/17 1132      PT LONG TERM GOAL #1   Title  Patient will demonstrate improved function with daily tasks and decreased back pain as indicated by MODI score of 40% or better by 05/09/2017    Baseline MODI 58%   Status New     PT LONG TERM GOAL #2   Title  Patient will demonstrate improved function with progressing towards prior level of funciton with daily tasks and decreased back pain as indicated by MODI score of 20% or better by 06/07/2017    Baseline MDI 58%   Status New     PT LONG TERM GOAL #3   Title Patient will demonstrate improved posture awareness and pain control strategies to allow patient to sit/stand for >30 min. by 05/22/2017   Baseline unable to sit or stand >10 - 30 min. without increased pain   Status New     PT LONG TERM GOAL #4   Title Patient will be independent with home program for posture awareness, pain control, progressive exercises to allow patient to transition to self management once discharged from physical therapy 06/07/2017   Baseline limited knowledge of exercises and progrression without guidance and instruction   Status New                Plan - 04/18/17 0950    Clinical Impression Statement Patient demonstrated decreased endurance with exercises and required short rest periods between exercises and following 3 minutes of walking. He will benefit from continued physical therapy intervention to address strength and endurance to improve ambulation for home and in community.    Rehab Potential Good   Clinical Impairments Affecting Rehab Potential (+) acute condition s/p surgery, motivated, family support (-)co morbidities; shoulder surgeries, TKR bilateral, aortic anuerysm, pacemaker, CA   PT Frequency 2x / week   PT Duration 8 weeks   PT Treatment/Interventions Therapeutic exercise;Moist Heat;Patient/family education;Manual techniques;Gait training   PT Next Visit Plan progressive exercise, manual STM as needed   PT Home Exercise Plan ROM LE's seated ball rolling under foot, hip adduction, glute sets      Patient will benefit from skilled therapeutic intervention in order to improve the following deficits and impairments:  Decreased strength, Decreased activity tolerance, Impaired perceived functional ability, Pain, Increased muscle spasms, Decreased endurance, Decreased range of motion, Difficulty walking  Visit Diagnosis: Muscle weakness (generalized)  Difficulty in walking, not elsewhere classified  Acute low back pain without sciatica, unspecified back pain laterality     Problem List Patient Active Problem List   Diagnosis Date Noted  . Spondylolisthesis of lumbosacral region 03/16/2017  . Low back pain 02/01/2017  . Escherichia coli (E. coli) infection 01/28/2017  . Acute on chronic renal failure ([place]) 01/28/2017  . Elevated troponin 01/28/2017  . Generalized weakness 01/28/2017  . Essential hypertension 01/28/2017  . UTI (urinary tract infection) 01/26/2017  . AAA (abdominal aortic aneurysm) without rupture ([place]) 11/23/2016  . Chronic obstructive pulmonary disease ([place]) 10/09/2015  .  Personal history of diseases of skin or subcutaneous tissue 10/09/2015  . Malignant neoplasm of prostate ([place]) 10/09/2015  . Pure hypercholesterolemia 10/09/2015  . Sinoatrial node dysfunction ([place]) 10/09/2015  . History of surgical procedure 08/22/2015  . Status post total shoulder replacement 07/10/2015  . Arthritis of shoulder region, degenerative 06/10/2015  . Benign essential HTN 06/03/2015  .  Absolute anemia 04/12/2015  . Atrophic kidney 04/12/2015  . Essential (primary) hypertension 04/12/2015  . Acid reflux 04/12/2015  . Cancer of lung ([place]) 04/12/2015  . CA of prostate ([place]) 04/12/2015  . H/O adenomatous polyp of colon 01/15/2015  . Temporary cerebral vascular dysfunction 11/21/2014  . Calcific shoulder tendinitis 08/01/2014  . Cervical nerve root disorder 04/07/2012    [name] PT 04/18/2017, 12:50 PM  [facility] [address] [place], [address] Phone: [phone]   Fax:  [phone]  Name: Rick Mcbride. MRN: [id number] Date of Birth: 1941-07-13

## 2017-04-19 ENCOUNTER — Ambulatory Visit: Payer: PPO | Admitting: Physical Therapy

## 2017-04-20 ENCOUNTER — Encounter: Payer: Self-pay | Admitting: Physical Therapy

## 2017-04-20 ENCOUNTER — Encounter: Payer: PPO | Admitting: Physical Therapy

## 2017-04-20 ENCOUNTER — Ambulatory Visit: Payer: PPO | Admitting: Physical Therapy

## 2017-04-20 DIAGNOSIS — M6281 Muscle weakness (generalized): Secondary | ICD-10-CM

## 2017-04-20 DIAGNOSIS — M545 Low back pain, unspecified: Secondary | ICD-10-CM

## 2017-04-20 DIAGNOSIS — R262 Difficulty in walking, not elsewhere classified: Secondary | ICD-10-CM

## 2017-04-21 ENCOUNTER — Encounter: Payer: PPO | Admitting: Physical Therapy

## 2017-04-21 DIAGNOSIS — I10 Essential (primary) hypertension: Secondary | ICD-10-CM | POA: Diagnosis not present

## 2017-04-21 DIAGNOSIS — I441 Atrioventricular block, second degree: Secondary | ICD-10-CM | POA: Diagnosis not present

## 2017-04-21 DIAGNOSIS — I714 Abdominal aortic aneurysm, without rupture: Secondary | ICD-10-CM | POA: Diagnosis not present

## 2017-04-21 DIAGNOSIS — I25810 Atherosclerosis of coronary artery bypass graft(s) without angina pectoris: Secondary | ICD-10-CM | POA: Diagnosis not present

## 2017-04-21 NOTE — Therapy (Signed)
[facility] [address] [place], [address] Phone: [phone]   Fax:  [phone]  Physical Therapy Treatment  Patient Details  Name: Rick Mcbride. MRN: [id number] Date of Birth: 09-Dec-1940 Referring Provider: Ophelia Charter MD  Encounter Date: 04/20/2017      PT End of Session - 04/20/17 1400    Visit Number 3   Number of Visits 16   Date for PT Re-Evaluation 06/07/17   Authorization Type 3   Authorization Time Period 10 (G code)   PT Start Time 1302   PT Stop Time 1345   PT Time Calculation (min) 43 min   Activity Tolerance Patient tolerated treatment well   Behavior During Therapy [facility] for tasks assessed/performed      Past Medical History:  Diagnosis Date  . AAA (abdominal aortic aneurysm) ([place])   . AAA (abdominal aortic aneurysm) without rupture ([place])   . Anemia   . Aneurysm ([place])    abd aortic  . Anxiety   . Atrophic kidney   . Cervical radiculopathy   . Chronic airway obstruction (HCC)    not aware of this  . Chronic kidney disease (CKD), stage III (moderate)    followed by Dr. Johnny Bridge  . Chronic tension headaches   . Coronary artery disease   . Coronary atherosclerosis of autologous vein bypass graft   . DDD (degenerative disc disease), lumbar   . Degenerative disc disease, lumbar    with lumbar radiculopathy  . Dyspnea   . Elbow fracture, left   . GERD (gastroesophageal reflux disease)   . H/O adenomatous polyp of colon   . H/O hemorrhoids   . H/O urticaria   . Headache   . Heart disease   . Hypercholesteremia   . Hyperlipidemia   . 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 Lucky Cowboy  . Lung cancer ([place])   . Lung cancer ([place])   . Meralgia paresthetica   . Meralgia paresthetica   . Neuralgia   . Osteoarthritis   . Osteoarthritis    s/p L knee surgery  . 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 Presence of permanent cardiac pacemaker   . Prostate cancer ([place])   . Second degree AV block    Followed by Dr. Nehemiah Massed  . Sinoatrial node dysfunction (HCC)   . Status post partial lobectomy of lung    bottom right   . Stroke ([place])   . TIA (transient ischemic attack)     Past Surgical History:  Procedure Laterality Date  . CATARACT EXTRACTION    . COLONOSCOPY    . COLONOSCOPY    . COLONOSCOPY WITH PROPOFOL N/A 10/20/2015   Procedure: COLONOSCOPY WITH PROPOFOL;  Surgeon: Manya Silvas, MD;  Location: [facility] ENDOSCOPY;  Service: Endoscopy;  Laterality: N/A;  . CORONARY ARTERY BYPASS GRAFT     triple  . coronary atherosclerosis of autologous vein bypass graft    . EMBOLIZATION Right 12/27/2016   Procedure: Embolization;  Surgeon: Algernon Huxley, MD;  Location: [facility];  Service: Cardiovascular;  Laterality: Right;  . ENDOVASCULAR REPAIR/STENT GRAFT N/A 01/05/2017   Procedure: Endovascular Repair/Stent Graft;  Surgeon: Algernon Huxley, MD;  Location: [facility];  Service: Cardiovascular;  Laterality: N/A;  . EYE SURGERY Bilateral    cataract extraction  . JOINT REPLACEMENT  shoulder and knees  . KNEE ARTHROSCOPY    . LOBECTOMY  01/31/13   RLL w/squamous cell carcinoma lobectomy  . LUNG REMOVAL, PARTIAL  2014   right lower lobe  . PACEMAKER INSERTION    . PACEMAKER INSERTION  12/2012   Dual chanber pacemaker generator  . partial shoulder replacement Right   . POLYPECTOMY    . PROSTATECTOMY    . TOTAL KNEE ARTHROPLASTY Bilateral   . TOTAL SHOULDER ARTHROPLASTY Left 07/10/2015   Procedure: TOTAL SHOULDER ARTHROPLASTY;  Surgeon: [name], MD;  Location: [facility] ORS;  Service: Orthopedics;  Laterality: Left;  . TOTAL SHOULDER REPLACEMENT      There were no vitals filed for this visit.      Subjective Assessment - 04/20/17 1306    Subjective Patient reports he is sore from previous session and is still having increased soreness in mid back above surgery.    Pertinent History Patient has had back pain for years with multiple episodes of exacerbation of symptoms. He reports that most recent episode began summer 2017 following painting a 6' fence and the pain got progressively worse with weakness in LEs. He then has MRI and then surgery 03/16/2017. He has history of TIA with residueal short term memory diysfunction and bilateral shoulder surgeries, bilateral TKR and aortic anuerysm and pacemaker.    Limitations Sitting;Lifting;Walking;Standing;House hold activities   How long can you sit comfortably? >30 min.   How long can you stand comfortably? no longer than 10 min   How long can you walk comfortably? walking with rolling walker short distances   Patient Stated Goals improve strength, walk without AD, return to prior level of function   Currently in Pain? Yes   Pain Score 3    Pain Location Back   Pain Orientation Lower   Pain Descriptors / Indicators Aching   Pain Type Acute pain;Surgical pain  03/16/2017   Pain Onset More than a month ago  03/16/2017   Pain Frequency Intermittent      Objective: Gait; ambulating independently with rolling walker (front wheeled) and back brace support in place; steppage gait left LE; verbally cued patient to slow pace to improve motor control left LE  Treatment: Therapeutic exercise: patient performed with VC, tactile cues and demonstration of therapist: back brace in place throughout session: Balance system for weight shifting forward/back and side to side x 2 min. Each with UE support for balance and with VC and close supervision for safety  Sitting: in chair with back supported and back brace in place for all exercises (moist heat applied to mid/upper back to assist with pain, soreness; no adverse reactions noted) Roll ball under each foot  30 reps each Knee extension through partial ROM 2 x 15 Green resistive band knee flexion  2 x 15 reps each LE Hip abduction with resistive band x 15 reps both and 15  reps single leg with guided ROM Ankle DF/PF and eversion each LE with alternating feet 15 reps each with VC and demonstration; difficulty with left foot eversion     Patient response to treatment: Patient demonstrated improved technique and motor control with exercises with moderate assistance, verbal cues. No increased back pain reported throughout session.            PT Education - 04/20/17 1345    Education provided Yes   Education Details HEP added ankle exercises    Person(s) Educated Patient   Methods Explanation;Demonstration;Verbal cues;Handout   Comprehension Verbalized understanding;Returned demonstration;Verbal cues  required             PT Long Term Goals - 04/12/17 1132      PT LONG TERM GOAL #1   Title  Patient will demonstrate improved function with daily tasks and decreased back pain as indicated by MODI score of 40% or better by 05/09/2017    Baseline MODI 58%   Status New     PT LONG TERM GOAL #2   Title  Patient will demonstrate improved function with progressing towards prior level of funciton with daily tasks and decreased back pain as indicated by MODI score of 20% or better by 06/07/2017    Baseline MDI 58%   Status New     PT LONG TERM GOAL #3   Title Patient will demonstrate improved posture awareness and pain control strategies to allow patient to sit/stand for >30 min. by 05/22/2017   Baseline unable to sit or stand >10 - 30 min. without increased pain   Status New     PT LONG TERM GOAL #4   Title Patient will be independent with home program for posture awareness, pain control, progressive exercises to allow patient to transition to self management once discharged from physical therapy 06/07/2017   Baseline limited knowledge of exercises and progrression without guidance and instruction   Status New               Plan - 04/20/17 1345    Clinical Impression Statement Patient continues with weakness in core and left LE which limits  walking and exercise progression. He was able to perform exercise without increased pain today and reported decreased soreness in back with moist heat. He will benefit from continued physical therapy intervention to achieve goals.    Rehab Potential Good   Clinical Impairments Affecting Rehab Potential (+) acute condition s/p surgery, motivated, family support (-)co morbidities; shoulder surgeries, TKR bilateral, aortic anuerysm, pacemaker, CA   PT Frequency 2x / week   PT Duration 8 weeks   PT Treatment/Interventions Therapeutic exercise;Moist Heat;Patient/family education;Manual techniques;Gait training   PT Next Visit Plan progressive exercise, manual STM as needed   PT Home Exercise Plan ROM LE's seated ball rolling under foot, hip adduction, glute sets      Patient will benefit from skilled therapeutic intervention in order to improve the following deficits and impairments:  Decreased strength, Decreased activity tolerance, Impaired perceived functional ability, Pain, Increased muscle spasms, Decreased endurance, Decreased range of motion, Difficulty walking  Visit Diagnosis: Muscle weakness (generalized)  Difficulty in walking, not elsewhere classified  Acute low back pain without sciatica, unspecified back pain laterality     Problem List Patient Active Problem List   Diagnosis Date Noted  . Spondylolisthesis of lumbosacral region 03/16/2017  . Low back pain 02/01/2017  . Escherichia coli (E. coli) infection 01/28/2017  . Acute on chronic renal failure ([place]) 01/28/2017  . Elevated troponin 01/28/2017  . Generalized weakness 01/28/2017  . Essential hypertension 01/28/2017  . UTI (urinary tract infection) 01/26/2017  . AAA (abdominal aortic aneurysm) without rupture ([place]) 11/23/2016  . Chronic obstructive pulmonary disease ([place]) 10/09/2015  . Personal history of diseases of skin or subcutaneous tissue 10/09/2015  . Malignant neoplasm of prostate ([place]) 10/09/2015  . Pure  hypercholesterolemia 10/09/2015  . Sinoatrial node dysfunction ([place]) 10/09/2015  . History of surgical procedure 08/22/2015  . Status post total shoulder replacement 07/10/2015  . Arthritis of shoulder region, degenerative 06/10/2015  . Benign essential HTN 06/03/2015  . Absolute anemia 04/12/2015  .  Atrophic kidney 04/12/2015  . Essential (primary) hypertension 04/12/2015  . Acid reflux 04/12/2015  . Cancer of lung ([place]) 04/12/2015  . CA of prostate ([place]) 04/12/2015  . H/O adenomatous polyp of colon 01/15/2015  . Temporary cerebral vascular dysfunction 11/21/2014  . Calcific shoulder tendinitis 08/01/2014  . Cervical nerve root disorder 04/07/2012    Rick Mcbride PT 04/21/2017, 3:33 PM  [facility] [address], [place], [address] Phone: [phone]   Fax:  [phone]  Name: Rick Mcbride. MRN: [id number] Date of Birth: 1941-04-07

## 2017-04-25 ENCOUNTER — Encounter: Payer: PPO | Admitting: Physical Therapy

## 2017-04-26 ENCOUNTER — Encounter: Payer: Self-pay | Admitting: Physical Therapy

## 2017-04-26 ENCOUNTER — Ambulatory Visit: Payer: PPO | Admitting: Physical Therapy

## 2017-04-26 DIAGNOSIS — M6281 Muscle weakness (generalized): Secondary | ICD-10-CM

## 2017-04-26 DIAGNOSIS — R262 Difficulty in walking, not elsewhere classified: Secondary | ICD-10-CM

## 2017-04-26 DIAGNOSIS — M545 Low back pain, unspecified: Secondary | ICD-10-CM

## 2017-04-26 NOTE — Therapy (Signed)
Rick Mcbride [address], [place], [address] Phone: [phone]   Fax:  [phone]  Physical Therapy Treatment  Patient Details  Name: Rick Mcbride. MRN: [id number] Date of Birth: [birth year] Referring Provider: Ophelia Charter MD  Encounter Date: 04/26/2017      PT End of Session - 04/26/17 1558    Visit Number 4   Number of Visits 16   Date for PT Re-Evaluation 06/07/17   Authorization Type 4   Authorization Time Period 10 (G code)   PT Start Time 1500   PT Stop Time 1540   PT Time Calculation (min) 40 min   Activity Tolerance Patient tolerated treatment well   Behavior During Therapy Rick Mcbride for tasks assessed/performed      Past Medical History:  Diagnosis Date  . AAA (abdominal aortic aneurysm) ([place])   . AAA (abdominal aortic aneurysm) without rupture ([place])   . Anemia   . Aneurysm ([place])    abd aortic  . Anxiety   . Atrophic kidney   . Cervical radiculopathy   . Chronic airway obstruction (HCC)    not aware of this  . Chronic kidney disease (CKD), stage III (moderate)    followed by Dr. Johnny Bridge  . Chronic tension headaches   . Coronary artery disease   . Coronary atherosclerosis of autologous vein bypass graft   . DDD (degenerative disc disease), lumbar   . Degenerative disc disease, lumbar    with lumbar radiculopathy  . Dyspnea   . Elbow fracture, left   . GERD (gastroesophageal reflux disease)   . H/O adenomatous polyp of colon   . H/O hemorrhoids   . H/O urticaria   . Headache   . Heart disease   . Hypercholesteremia   . Hyperlipidemia   . 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 Lucky Cowboy  . Lung cancer ([place])   . Lung cancer ([place])   . Meralgia paresthetica   . Meralgia paresthetica   . Neuralgia   . Osteoarthritis   . Osteoarthritis    s/p L knee surgery  . 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 Presence of permanent cardiac pacemaker   . Prostate cancer ([place])   . Second degree AV block    Followed by Dr. Nehemiah Massed  . Sinoatrial node dysfunction (HCC)   . Status post partial lobectomy of lung    bottom right   . Stroke ([place])   . TIA (transient ischemic attack)     Past Surgical History:  Procedure Laterality Date  . CATARACT EXTRACTION    . COLONOSCOPY    . COLONOSCOPY    . COLONOSCOPY WITH PROPOFOL N/A 10/20/2015   Procedure: COLONOSCOPY WITH PROPOFOL;  Surgeon: Manya Silvas, MD;  Location: [facility];  Service: Endoscopy;  Laterality: N/A;  . CORONARY ARTERY BYPASS GRAFT     triple  . coronary atherosclerosis of autologous vein bypass graft    . EMBOLIZATION Right 12/27/2016   Procedure: Embolization;  Surgeon: Algernon Huxley, MD;  Location: [facility];  Service: Cardiovascular;  Laterality: Right;  . ENDOVASCULAR REPAIR/STENT GRAFT N/A 01/05/2017   Procedure: Endovascular Repair/Stent Graft;  Surgeon: Algernon Huxley, MD;  Location: [facility];  Service: Cardiovascular;  Laterality: N/A;  . EYE SURGERY Bilateral    cataract extraction  . JOINT REPLACEMENT  shoulder and knees  . KNEE ARTHROSCOPY    . LOBECTOMY  01/31/13   RLL w/squamous cell carcinoma lobectomy  . LUNG REMOVAL, PARTIAL  2014   right lower lobe  . PACEMAKER INSERTION    . PACEMAKER INSERTION  12/2012   Dual chanber pacemaker generator  . partial shoulder replacement Right   . POLYPECTOMY    . PROSTATECTOMY    . TOTAL KNEE ARTHROPLASTY Bilateral   . TOTAL SHOULDER ARTHROPLASTY Left 07/10/2015   Procedure: TOTAL SHOULDER ARTHROPLASTY;  Surgeon: [name], MD;  Location: [facility] ORS;  Service: Orthopedics;  Laterality: Left;  . TOTAL SHOULDER REPLACEMENT      There were no vitals filed for this visit.      Subjective Assessment - 04/26/17 1503    Subjective Patient reports he is 2-3/10 back pain today and had increased pain in his left leg last evening following increased  activity during the day   Pertinent History Patient has had back pain for years with multiple episodes of exacerbation of symptoms. He reports that most recent episode began summer 2017 following painting a 6' fence and the pain got progressively worse with weakness in LEs. He then has MRI and then surgery 03/16/2017. He has history of TIA with residueal short term memory diysfunction and bilateral shoulder surgeries, bilateral TKR and aortic anuerysm and pacemaker.    Limitations Sitting;Lifting;Walking;Standing;House hold activities   How long can you sit comfortably? >30 min.   How long can you stand comfortably? no longer than 10 min   How long can you walk comfortably? walking with rolling walker short distances   Patient Stated Goals improve strength, walk without AD, return to prior level of function   Currently in Pain? Yes   Pain Score 3    Pain Location Back   Pain Descriptors / Indicators Aching   Pain Type Acute pain;Surgical pain  03/16/2017   Pain Onset More than a month ago  03/16/2017   Pain Frequency Intermittent      Objective: Gait; ambulating independently with rollator and back brace support in place; verbally cued patient to slow pace to improve motor control left LE  Treatment: Therapeutic exercise: patient performed with VC, tactile cues and demonstration of therapist: back brace in place throughout session: Sitting: in chair, balance pad on chair to increase height with back supported and back brace in place for all exercises  Rocker board under both feet: forward and back and side to side x 25 reps Knee extension through partial ROM with 2# weight on ankles 2 x 15 Red resistive band knee flexion  2 x 15 reps each LE Hip abduction with resistive band x 15 reps both and 15 reps single leg with guided ROM Resistive DF alternating each foot with red resistive band 3 x 5 reps with fatigue noted left foot Standing weight shift onto balance stone x 10 each LE with using UE  for support/balance and close supervision of therapist for safety UE exercises seated in chaire with red resistive band: with assistance of therapist palloff press forward x 15 Bilateral scapular retraction x 20 Single arm row x 15 reps each UE   Patient response to treatment: Patient demonstrated improved technique and motor control with exercises with moderate cuing and assistance of therapist for correct technique and alignment. Decreased endurance with standing exercises due to decreased sensation in feet.       PT Education - 04/26/17 1532    Education provided Yes   Education  Details HEP added single arm row, bilateral scapular retraciton with red band, palloff press red band   Person(s) Educated Patient   Methods Explanation;Demonstration;Verbal cues;Handout   Comprehension Verbalized understanding;Returned demonstration;Verbal cues required             PT Long Term Goals - 04/12/17 1132      PT LONG TERM GOAL #1   Title  Patient will demonstrate improved function with daily tasks and decreased back pain as indicated by MODI score of 40% or better by 05/09/2017    Baseline MODI 58%   Status New     PT LONG TERM GOAL #2   Title  Patient will demonstrate improved function with progressing towards prior level of funciton with daily tasks and decreased back pain as indicated by MODI score of 20% or better by 06/07/2017    Baseline MDI 58%   Status New     PT LONG TERM GOAL #3   Title Patient will demonstrate improved posture awareness and pain control strategies to allow patient to sit/stand for >30 min. by 05/22/2017   Baseline unable to sit or stand >10 - 30 min. without increased pain   Status New     PT LONG TERM GOAL #4   Title Patient will be independent with home program for posture awareness, pain control, progressive exercises to allow patient to transition to self management once discharged from physical therapy 06/07/2017   Baseline limited knowledge of exercises  and progrression without guidance and instruction   Status New               Plan - 04/26/17 1559    Clinical Impression Statement Patient demonstrated improved endurance and strength with exercises as demonstrated by less assistance and rest periods during exercise sessions. He continues with limitations with ambulation and decreased strength and should continue to improve with additional physical therapy intervention.    Rehab Potential Good   Clinical Impairments Affecting Rehab Potential (+) acute condition s/p surgery, motivated, family support (-)co morbidities; shoulder surgeries, TKR bilateral, aortic anuerysm, pacemaker, CA   PT Frequency 2x / week   PT Duration 8 weeks   PT Treatment/Interventions Therapeutic exercise;Moist Heat;Patient/family education;Manual techniques;Gait training   PT Next Visit Plan progressive exercise, manual STM as needed   PT Home Exercise Plan ROM LE's seated ball rolling under foot, hip adduction, glute sets      Patient will benefit from skilled therapeutic intervention in order to improve the following deficits and impairments:  Decreased strength, Decreased activity tolerance, Impaired perceived functional ability, Pain, Increased muscle spasms, Decreased endurance, Decreased range of motion, Difficulty walking  Visit Diagnosis: Muscle weakness (generalized)  Difficulty in walking, not elsewhere classified  Acute low back pain without sciatica, unspecified back pain laterality     Problem List Patient Active Problem List   Diagnosis Date Noted  . Spondylolisthesis of lumbosacral region 03/16/2017  . Low back pain 02/01/2017  . Escherichia coli (E. coli) infection 01/28/2017  . Acute on chronic renal failure ([place]) 01/28/2017  . Elevated troponin 01/28/2017  . Generalized weakness 01/28/2017  . Essential hypertension 01/28/2017  . UTI (urinary tract infection) 01/26/2017  . AAA (abdominal aortic aneurysm) without rupture ([place])  11/23/2016  . Chronic obstructive pulmonary disease ([place]) 10/09/2015  . Personal history of diseases of skin or subcutaneous tissue 10/09/2015  . Malignant neoplasm of prostate ([place]) 10/09/2015  . Pure hypercholesterolemia 10/09/2015  . Sinoatrial node dysfunction ([place]) 10/09/2015  . History of surgical procedure 08/22/2015  .  Status post total shoulder replacement 07/10/2015  . Arthritis of shoulder region, degenerative 06/10/2015  . Benign essential HTN 06/03/2015  . Absolute anemia 04/12/2015  . Atrophic kidney 04/12/2015  . Essential (primary) hypertension 04/12/2015  . Acid reflux 04/12/2015  . Cancer of lung ([place]) 04/12/2015  . CA of prostate ([place]) 04/12/2015  . H/O adenomatous polyp of colon 01/15/2015  . Temporary cerebral vascular dysfunction 11/21/2014  . Calcific shoulder tendinitis 08/01/2014  . Cervical nerve root disorder 04/07/2012    Rick Mcbride PT 04/26/2017, 4:10 PM  [facility] [address] [place], [address] Phone: [phone]   Fax:  [phone]  Name: Rick Mcbride. MRN: [id number] Date of Birth: 11-24-1940

## 2017-04-27 ENCOUNTER — Encounter: Payer: PPO | Admitting: Physical Therapy

## 2017-04-28 ENCOUNTER — Ambulatory Visit: Payer: PPO | Admitting: Physical Therapy

## 2017-04-28 ENCOUNTER — Encounter: Payer: Self-pay | Admitting: Physical Therapy

## 2017-04-28 DIAGNOSIS — M6281 Muscle weakness (generalized): Secondary | ICD-10-CM

## 2017-04-28 DIAGNOSIS — M545 Low back pain, unspecified: Secondary | ICD-10-CM

## 2017-04-28 DIAGNOSIS — R262 Difficulty in walking, not elsewhere classified: Secondary | ICD-10-CM

## 2017-04-28 NOTE — Therapy (Signed)
[facility] [address], [place], [address] Phone: [phone]   Fax:  [phone]  Physical Therapy Treatment  Patient Details  Name: Rick Mcbride. MRN: [id number] Date of Birth: [birth year] Referring Provider: Ophelia Charter MD  Encounter Date: 04/28/2017      PT End of Session - 04/28/17 1506    Visit Number 5   Number of Visits 16   Date for PT Re-Evaluation 06/07/17   Authorization Type 5   Authorization Time Period 10 (G code)   PT Start Time 1458   PT Stop Time 1530   PT Time Calculation (min) 32 min   Activity Tolerance Patient tolerated treatment well   Behavior During Therapy [facility] for tasks assessed/performed      Past Medical History:  Diagnosis Date  . AAA (abdominal aortic aneurysm) ([place])   . AAA (abdominal aortic aneurysm) without rupture ([place])   . Anemia   . Aneurysm ([place])    abd aortic  . Anxiety   . Atrophic kidney   . Cervical radiculopathy   . Chronic airway obstruction (HCC)    not aware of this  . Chronic kidney disease (CKD), stage III (moderate)    followed by Dr. Johnny Bridge  . Chronic tension headaches   . Coronary artery disease   . Coronary atherosclerosis of autologous vein bypass graft   . DDD (degenerative disc disease), lumbar   . Degenerative disc disease, lumbar    with lumbar radiculopathy  . Dyspnea   . Elbow fracture, left   . GERD (gastroesophageal reflux disease)   . H/O adenomatous polyp of colon   . H/O hemorrhoids   . H/O urticaria   . Headache   . Heart disease   . Hypercholesteremia   . Hyperlipidemia   . 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 Lucky Cowboy  . Lung cancer ([place])   . Lung cancer ([place])   . Meralgia paresthetica   . Meralgia paresthetica   . Neuralgia   . Osteoarthritis   . Osteoarthritis    s/p L knee surgery  . 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 Presence of permanent cardiac pacemaker   . Prostate cancer ([place])   . Second degree AV block    Followed by Dr. Nehemiah Massed  . Sinoatrial node dysfunction (HCC)   . Status post partial lobectomy of lung    bottom right   . Stroke ([place])   . TIA (transient ischemic attack)     Past Surgical History:  Procedure Laterality Date  . CATARACT EXTRACTION    . COLONOSCOPY    . COLONOSCOPY    . COLONOSCOPY WITH PROPOFOL N/A 10/20/2015   Procedure: COLONOSCOPY WITH PROPOFOL;  Surgeon: Manya Silvas, MD;  Location: [facility] ENDOSCOPY;  Service: Endoscopy;  Laterality: N/A;  . CORONARY ARTERY BYPASS GRAFT     triple  . coronary atherosclerosis of autologous vein bypass graft    . EMBOLIZATION Right 12/27/2016   Procedure: Embolization;  Surgeon: Algernon Huxley, MD;  Location: [facility];  Service: Cardiovascular;  Laterality: Right;  . ENDOVASCULAR REPAIR/STENT GRAFT N/A 01/05/2017   Procedure: Endovascular Repair/Stent Graft;  Surgeon: Algernon Huxley, MD;  Location: [facility];  Service: Cardiovascular;  Laterality: N/A;  . EYE SURGERY Bilateral    cataract extraction  . JOINT REPLACEMENT  shoulder and knees  . KNEE ARTHROSCOPY    . LOBECTOMY  01/31/13   RLL w/squamous cell carcinoma lobectomy  . LUNG REMOVAL, PARTIAL  2014   right lower lobe  . PACEMAKER INSERTION    . PACEMAKER INSERTION  12/2012   Dual chanber pacemaker generator  . partial shoulder replacement Right   . POLYPECTOMY    . PROSTATECTOMY    . TOTAL KNEE ARTHROPLASTY Bilateral   . TOTAL SHOULDER ARTHROPLASTY Left 07/10/2015   Procedure: TOTAL SHOULDER ARTHROPLASTY;  Surgeon: [name], MD;  Location: [facility] ORS;  Service: Orthopedics;  Laterality: Left;  . TOTAL SHOULDER REPLACEMENT      There were no vitals filed for this visit.      Subjective Assessment - 04/28/17 1502    Subjective Patient reports he is still having pain in left lower back and decreased sensation in both feet, about the same for  the past week.    Pertinent History Patient has had back pain for years with multiple episodes of exacerbation of symptoms. He reports that most recent episode began summer 2017 following painting a 6' fence and the pain got progressively worse with weakness in LEs. He then has MRI and then surgery 03/16/2017. He has history of TIA with residueal short term memory diysfunction and bilateral shoulder surgeries, bilateral TKR and aortic anuerysm and pacemaker.    Limitations Sitting;Lifting;Walking;Standing;House hold activities   How long can you sit comfortably? >30 min.   How long can you stand comfortably? no longer than 10 min   How long can you walk comfortably? walking with rolling walker short distances   Patient Stated Goals improve strength, walk without AD, return to prior level of function   Currently in Pain? Yes   Pain Score 2    Pain Location Back   Pain Orientation Lower   Pain Descriptors / Indicators Aching   Pain Type Acute pain;Surgical pain  03/16/2017   Pain Onset More than a month ago  03/16/2017   Pain Frequency Intermittent         Objective: Gait; ambulating independently with rollator and back brace support in place, more erect posture and increased cadence as compared to previous session  Treatment: Therapeutic exercise: patient performed with VC, tactile cues and demonstration of therapist: back brace in place throughout session: Sitting: in chair, with back supported andback brace in place for all exercises  Rocker board under both feet: forward and back and side to side x 2 min. Knee extension through partial ROM with 2# weight on ankles 2x 15 Red resistive band knee flexion 2 x 15reps each LE Hip abduction with green resistive band x 15 reps both and 15 reps single leg with guided ROM Resistive DF alternating each foot with red resistive band 2x 15 reps with  Mild fatigue noted left foot  UE exercises seated in chaire with green resistive band: with  assistance of therapist palloff press forward x 15 one time holding still isometric and 1x moving through motion (all UE exercises) Bilateral scapular retraction x 20 Single arm row x 15 reps each UE   Patient response to treatment: Patient demonstrated improved technique, motor control with good alignment with minimal VC and demonstration.           PT Education - 04/28/17 1505    Education provided Yes   Education Details HEP reassessed using green and blue resistive bands   Person(s) Educated Patient   Methods Explanation;Demonstration;Verbal cues   Comprehension Returned  demonstration;Verbalized understanding;Verbal cues required             PT Long Term Goals - 04/12/17 1132      PT LONG TERM GOAL #1   Title  Patient will demonstrate improved function with daily tasks and decreased back pain as indicated by MODI score of 40% or better by 05/09/2017    Baseline MODI 58%   Status New     PT LONG TERM GOAL #2   Title  Patient will demonstrate improved function with progressing towards prior level of funciton with daily tasks and decreased back pain as indicated by MODI score of 20% or better by 06/07/2017    Baseline MDI 58%   Status New     PT LONG TERM GOAL #3   Title Patient will demonstrate improved posture awareness and pain control strategies to allow patient to sit/stand for >30 min. by 05/22/2017   Baseline unable to sit or stand >10 - 30 min. without increased pain   Status New     PT LONG TERM GOAL #4   Title Patient will be independent with home program for posture awareness, pain control, progressive exercises to allow patient to transition to self management once discharged from physical therapy 06/07/2017   Baseline limited knowledge of exercises and progrression without guidance and instruction   Status New               Plan - 04/28/17 1506    Clinical Impression Statement patient demonstrates good progress towards goals. He is performing  exercises with increased resistance, repetitions and with improving endurance. He will benefit from additional physical therapy intervention to address limitations and achieve all goals.    Rehab Potential Good   Clinical Impairments Affecting Rehab Potential (+) acute condition s/p surgery, motivated, family support (-)co morbidities; shoulder surgeries, TKR bilateral, aortic anuerysm, pacemaker, CA   PT Frequency 2x / week   PT Duration 8 weeks   PT Treatment/Interventions Therapeutic exercise;Moist Heat;Patient/family education;Manual techniques;Gait training   PT Next Visit Plan progressive exercise, manual STM as needed   PT Home Exercise Plan ROM LE's seated ball rolling under foot, hip adduction, glute sets      Patient will benefit from skilled therapeutic intervention in order to improve the following deficits and impairments:  Decreased strength, Decreased activity tolerance, Impaired perceived functional ability, Pain, Increased muscle spasms, Decreased endurance, Decreased range of motion, Difficulty walking  Visit Diagnosis: Muscle weakness (generalized)  Difficulty in walking, not elsewhere classified  Acute low back pain without sciatica, unspecified back pain laterality     Problem List Patient Active Problem List   Diagnosis Date Noted  . Spondylolisthesis of lumbosacral region 03/16/2017  . Low back pain 02/01/2017  . Escherichia coli (E. coli) infection 01/28/2017  . Acute on chronic renal failure ([place]) 01/28/2017  . Elevated troponin 01/28/2017  . Generalized weakness 01/28/2017  . Essential hypertension 01/28/2017  . UTI (urinary tract infection) 01/26/2017  . AAA (abdominal aortic aneurysm) without rupture ([place]) 11/23/2016  . Chronic obstructive pulmonary disease ([place]) 10/09/2015  . Personal history of diseases of skin or subcutaneous tissue 10/09/2015  . Malignant neoplasm of prostate ([place]) 10/09/2015  . Pure hypercholesterolemia 10/09/2015  . Sinoatrial  node dysfunction ([place]) 10/09/2015  . History of surgical procedure 08/22/2015  . Status post total shoulder replacement 07/10/2015  . Arthritis of shoulder region, degenerative 06/10/2015  . Benign essential HTN 06/03/2015  . Absolute anemia 04/12/2015  . Atrophic kidney 04/12/2015  . Essential (primary)  hypertension 04/12/2015  . Acid reflux 04/12/2015  . Cancer of lung ([place]) 04/12/2015  . CA of prostate ([place]) 04/12/2015  . H/O adenomatous polyp of colon 01/15/2015  . Temporary cerebral vascular dysfunction 11/21/2014  . Calcific shoulder tendinitis 08/01/2014  . Cervical nerve root disorder 04/07/2012    Rick Mcbride PT 04/28/2017, 3:38 PM  [facility] [address] [place], [address] Phone: [phone]   Fax:  [phone]  Name: Rick Mcbride. MRN: [id number] Date of Birth: 11/28/1940

## 2017-05-02 ENCOUNTER — Encounter: Payer: PPO | Admitting: Physical Therapy

## 2017-05-03 ENCOUNTER — Encounter: Payer: Self-pay | Admitting: Physical Therapy

## 2017-05-03 ENCOUNTER — Ambulatory Visit: Payer: PPO | Admitting: Physical Therapy

## 2017-05-03 DIAGNOSIS — M545 Low back pain, unspecified: Secondary | ICD-10-CM

## 2017-05-03 DIAGNOSIS — R262 Difficulty in walking, not elsewhere classified: Secondary | ICD-10-CM

## 2017-05-03 DIAGNOSIS — M6281 Muscle weakness (generalized): Secondary | ICD-10-CM | POA: Diagnosis not present

## 2017-05-03 NOTE — Therapy (Signed)
[facility] [address], [place], [address] Phone: [phone]   Fax:  [phone]  Physical Therapy Treatment  Patient Details  Name: Rick Mcbride. MRN: [id number] Date of Birth: [birth year] Referring Provider: Ophelia Charter MD  Encounter Date: 05/03/2017      PT End of Session - 05/03/17 1545    Visit Number 5   Number of Visits 16   Date for PT Re-Evaluation 06/07/17   Authorization Type 5   Authorization Time Period 10 (G code)   PT Start Time 1501   PT Stop Time 1544   PT Time Calculation (min) 43 min   Activity Tolerance Patient tolerated treatment well   Behavior During Therapy [facility] for tasks assessed/performed      Past Medical History:  Diagnosis Date  . AAA (abdominal aortic aneurysm) ([place])   . AAA (abdominal aortic aneurysm) without rupture ([place])   . Anemia   . Aneurysm ([place])    abd aortic  . Anxiety   . Atrophic kidney   . Cervical radiculopathy   . Chronic airway obstruction (HCC)    not aware of this  . Chronic kidney disease (CKD), stage III (moderate)    followed by Dr. Johnny Bridge  . Chronic tension headaches   . Coronary artery disease   . Coronary atherosclerosis of autologous vein bypass graft   . DDD (degenerative disc disease), lumbar   . Degenerative disc disease, lumbar    with lumbar radiculopathy  . Dyspnea   . Elbow fracture, left   . GERD (gastroesophageal reflux disease)   . H/O adenomatous polyp of colon   . H/O hemorrhoids   . H/O urticaria   . Headache   . Heart disease   . Hypercholesteremia   . Hyperlipidemia   . 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 Lucky Cowboy  . Lung cancer ([place])   . Lung cancer ([place])   . Meralgia paresthetica   . Meralgia paresthetica   . Neuralgia   . Osteoarthritis   . Osteoarthritis    s/p L knee surgery  . 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 Presence of permanent cardiac pacemaker   . Prostate cancer ([place])   . Second degree AV block    Followed by Dr. Nehemiah Massed  . Sinoatrial node dysfunction (HCC)   . Status post partial lobectomy of lung    bottom right   . Stroke ([address])   . TIA (transient ischemic attack)     Past Surgical History:  Procedure Laterality Date  . CATARACT EXTRACTION    . COLONOSCOPY    . COLONOSCOPY    . COLONOSCOPY WITH PROPOFOL N/A 10/20/2015   Procedure: COLONOSCOPY WITH PROPOFOL;  Surgeon: Manya Silvas, MD;  Location: [facility] ENDOSCOPY;  Service: Endoscopy;  Laterality: N/A;  . CORONARY ARTERY BYPASS GRAFT     triple  . coronary atherosclerosis of autologous vein bypass graft    . EMBOLIZATION Right 12/27/2016   Procedure: Embolization;  Surgeon: Algernon Huxley, MD;  Location: [facility];  Service: Cardiovascular;  Laterality: Right;  . ENDOVASCULAR REPAIR/STENT GRAFT N/A 01/05/2017   Procedure: Endovascular Repair/Stent Graft;  Surgeon: Algernon Huxley, MD;  Location: [facility];  Service: Cardiovascular;  Laterality: N/A;  . EYE SURGERY Bilateral    cataract extraction  . JOINT REPLACEMENT  shoulder and knees  . KNEE ARTHROSCOPY    . LOBECTOMY  01/31/13   RLL w/squamous cell carcinoma lobectomy  . LUNG REMOVAL, PARTIAL  2014   right lower lobe  . PACEMAKER INSERTION    . PACEMAKER INSERTION  12/2012   Dual chanber pacemaker generator  . partial shoulder replacement Right   . POLYPECTOMY    . PROSTATECTOMY    . TOTAL KNEE ARTHROPLASTY Bilateral   . TOTAL SHOULDER ARTHROPLASTY Left 07/10/2015   Procedure: TOTAL SHOULDER ARTHROPLASTY;  Surgeon: [name], MD;  Location: [facility] ORS;  Service: Orthopedics;  Laterality: Left;  . TOTAL SHOULDER REPLACEMENT      There were no vitals filed for this visit.      Subjective Assessment - 05/03/17 1503    Subjective Patient reports he is still having left leg symptoms and his left leg is smaller than right.    Pertinent History  Patient has had back pain for years with multiple episodes of exacerbation of symptoms. He reports that most recent episode began summer 2017 following painting a 6' fence and the pain got progressively worse with weakness in LEs. He then has MRI and then surgery 03/16/2017. He has history of TIA with residueal short term memory diysfunction and bilateral shoulder surgeries, bilateral TKR and aortic anuerysm and pacemaker.    Limitations Sitting;Lifting;Walking;Standing;House hold activities   How long can you sit comfortably? >30 min.   How long can you stand comfortably? no longer than 10 min   How long can you walk comfortably? walking with rolling walker short distances   Patient Stated Goals improve strength, walk without AD, return to prior level of function   Currently in Pain? Yes   Pain Score 3    Pain Location Back   Pain Orientation Lower   Pain Descriptors / Indicators Aching   Pain Type Acute pain;Surgical pain  03/16/2017   Pain Onset More than a month ago  03/16/2017   Pain Frequency Intermittent        Objective: Gait; ambulating independently with rollatorand back brace support in place  Treatment: Therapeutic exercise: patient performed with VC, tactile cues and demonstration of therapist: back brace in place throughout session: Sitting: in chair, with back supported andback brace in place for all exercises  Knee extension through partial ROM with 3# weight on ankles2x 15 (one set tapping balance stones) green resistive band knee flexion 2 x 15reps each LE Hip abduction with green resistive band x 15 reps both and 15 reps single leg with guided ROM Resistive DF alternating each foot with red resistive band 2 x 15 reps with  Mild fatigue noted left foot during second set  UE exercises seated in chaire with green resistive band: with assistance of therapist palloff press forward (red and green band together) x 15 one time holding still isometric and 1x moving through  motion  Bilateral scapular retraction x 20 Single arm row x 15 reps each UE  Standing at counter for support: Walk side stepping x 1 min. With VC for hip, knee alignment and correct technique Standing raise LE and tap balance stones each 15x Toe raises 2 x 5 reps, heel raises 2 x 8 reps  Patient response to treatment: Patient with moderate fatigue following standing exercises and required close supervision and UE support for safety, balance. Improved motor control with repetition.              PT Education - 05/03/17 1520    Education  provided Yes   Education Details exercise instruction for correct technique, posture   Person(s) Educated Patient   Methods Explanation;Demonstration;Verbal cues   Comprehension Verbalized understanding;Returned demonstration;Verbal cues required             PT Long Term Goals - 04/12/17 1132      PT LONG TERM GOAL #1   Title  Patient will demonstrate improved function with daily tasks and decreased back pain as indicated by MODI score of 40% or better by 05/09/2017    Baseline MODI 58%   Status New     PT LONG TERM GOAL #2   Title  Patient will demonstrate improved function with progressing towards prior level of funciton with daily tasks and decreased back pain as indicated by MODI score of 20% or better by 06/07/2017    Baseline MDI 58%   Status New     PT LONG TERM GOAL #3   Title Patient will demonstrate improved posture awareness and pain control strategies to allow patient to sit/stand for >30 min. by 05/22/2017   Baseline unable to sit or stand >10 - 30 min. without increased pain   Status New     PT LONG TERM GOAL #4   Title Patient will be independent with home program for posture awareness, pain control, progressive exercises to allow patient to transition to self management once discharged from physical therapy 06/07/2017   Baseline limited knowledge of exercises and progrression without guidance and instruction   Status New                Plan - 05/03/17 1545    Clinical Impression Statement Patient demonstrated improved strength and endurance with exercises today. moderate fatigue noted at end of session due to recent surgery and continued healing along with other co morbidities. Patient demonstrates steady progress towards goals. Patient will benefit from continued physical therapy intervention to address limitations and achieve goals.    Rehab Potential Good   Clinical Impairments Affecting Rehab Potential (+) acute condition s/p surgery, motivated, family support (-)co morbidities; shoulder surgeries, TKR bilateral, aortic anuerysm, pacemaker, CA   PT Frequency 2x / week   PT Duration 8 weeks   PT Treatment/Interventions Therapeutic exercise;Moist Heat;Patient/family education;Manual techniques;Gait training   PT Next Visit Plan progressive exercise, manual STM as needed   PT Home Exercise Plan ROM LE's seated ball rolling under foot, hip adduction, glute sets, core stabilization with resistive bands for scapular rows, palloff press      Patient will benefit from skilled therapeutic intervention in order to improve the following deficits and impairments:  Decreased strength, Decreased activity tolerance, Impaired perceived functional ability, Pain, Increased muscle spasms, Decreased endurance, Decreased range of motion, Difficulty walking  Visit Diagnosis: Muscle weakness (generalized)  Difficulty in walking, not elsewhere classified  Acute low back pain without sciatica, unspecified back pain laterality     Problem List Patient Active Problem List   Diagnosis Date Noted  . Spondylolisthesis of lumbosacral region 03/16/2017  . Low back pain 02/01/2017  . Escherichia coli (E. coli) infection 01/28/2017  . Acute on chronic renal failure ([place]) 01/28/2017  . Elevated troponin 01/28/2017  . Generalized weakness 01/28/2017  . Essential hypertension 01/28/2017  . UTI (urinary tract infection)  01/26/2017  . AAA (abdominal aortic aneurysm) without rupture ([place]) 11/23/2016  . Chronic obstructive pulmonary disease ([place]) 10/09/2015  . Personal history of diseases of skin or subcutaneous tissue 10/09/2015  . Malignant neoplasm of prostate ([place]) 10/09/2015  . Pure hypercholesterolemia  10/09/2015  . Sinoatrial node dysfunction ([place]) 10/09/2015  . History of surgical procedure 08/22/2015  . Status post total shoulder replacement 07/10/2015  . Arthritis of shoulder region, degenerative 06/10/2015  . Benign essential HTN 06/03/2015  . Absolute anemia 04/12/2015  . Atrophic kidney 04/12/2015  . Essential (primary) hypertension 04/12/2015  . Acid reflux 04/12/2015  . Cancer of lung ([place]) 04/12/2015  . CA of prostate ([place]) 04/12/2015  . H/O adenomatous polyp of colon 01/15/2015  . Temporary cerebral vascular dysfunction 11/21/2014  . Calcific shoulder tendinitis 08/01/2014  . Cervical nerve root disorder 04/07/2012    [name] PT 05/03/2017, 6:33 PM  [facility] [address], [place], [address] Phone: [phone]   Fax:  [phone]  Name: Wen Merced. MRN: [id number] Date of Birth: 09-18-1941

## 2017-05-04 ENCOUNTER — Encounter: Payer: PPO | Admitting: Physical Therapy

## 2017-05-05 ENCOUNTER — Encounter: Payer: Self-pay | Admitting: Physical Therapy

## 2017-05-05 ENCOUNTER — Ambulatory Visit: Payer: PPO | Admitting: Physical Therapy

## 2017-05-05 DIAGNOSIS — M6281 Muscle weakness (generalized): Secondary | ICD-10-CM | POA: Diagnosis not present

## 2017-05-05 DIAGNOSIS — M545 Low back pain, unspecified: Secondary | ICD-10-CM

## 2017-05-05 DIAGNOSIS — R262 Difficulty in walking, not elsewhere classified: Secondary | ICD-10-CM

## 2017-05-05 NOTE — Therapy (Signed)
[facility] [address] [place], [address] Phone: [phone]   Fax:  [phone]  Physical Therapy Treatment  Patient Details  Name: Rick Mcbride. MRN: [id number] Date of Birth: 1940/11/11 Referring Provider: Ophelia Charter MD  Encounter Date: 05/05/2017      PT End of Session - 05/05/17 1631    Visit Number 6   Number of Visits 16   Date for PT Re-Evaluation 06/07/17   Authorization Type 6   Authorization Time Period 10 (G code)   PT Start Time 6834   PT Stop Time 1625   PT Time Calculation (min) 34 min   Activity Tolerance Patient tolerated treatment well   Behavior During Therapy [facility] for tasks assessed/performed      Past Medical History:  Diagnosis Date  . AAA (abdominal aortic aneurysm) ([place])   . AAA (abdominal aortic aneurysm) without rupture ([place])   . Anemia   . Aneurysm ([place])    abd aortic  . Anxiety   . Atrophic kidney   . Cervical radiculopathy   . Chronic airway obstruction (HCC)    not aware of this  . Chronic kidney disease (CKD), stage III (moderate)    followed by Dr. Johnny Bridge  . Chronic tension headaches   . Coronary artery disease   . Coronary atherosclerosis of autologous vein bypass graft   . DDD (degenerative disc disease), lumbar   . Degenerative disc disease, lumbar    with lumbar radiculopathy  . Dyspnea   . Elbow fracture, left   . GERD (gastroesophageal reflux disease)   . H/O adenomatous polyp of colon   . H/O hemorrhoids   . H/O urticaria   . Headache   . Heart disease   . Hypercholesteremia   . Hyperlipidemia   . 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 Lucky Cowboy  . Lung cancer ([place])   . Lung cancer ([place])   . Meralgia paresthetica   . Meralgia paresthetica   . Neuralgia   . Osteoarthritis   . Osteoarthritis    s/p L knee surgery  . 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 Presence of permanent cardiac pacemaker   . Prostate cancer ([place])   . Second degree AV block    Followed by Dr. Nehemiah Massed  . Sinoatrial node dysfunction (HCC)   . Status post partial lobectomy of lung    bottom right   . Stroke ([place])   . TIA (transient ischemic attack)     Past Surgical History:  Procedure Laterality Date  . CATARACT EXTRACTION    . COLONOSCOPY    . COLONOSCOPY    . COLONOSCOPY WITH PROPOFOL N/A 10/20/2015   Procedure: COLONOSCOPY WITH PROPOFOL;  Surgeon: Manya Silvas, MD;  Location: [facility] ENDOSCOPY;  Service: Endoscopy;  Laterality: N/A;  . CORONARY ARTERY BYPASS GRAFT     triple  . coronary atherosclerosis of autologous vein bypass graft    . EMBOLIZATION Right 12/27/2016   Procedure: Embolization;  Surgeon: Algernon Huxley, MD;  Location: [facility];  Service: Cardiovascular;  Laterality: Right;  . ENDOVASCULAR REPAIR/STENT GRAFT N/A 01/05/2017   Procedure: Endovascular Repair/Stent Graft;  Surgeon: Algernon Huxley, MD;  Location: [facility];  Service: Cardiovascular;  Laterality: N/A;  . EYE SURGERY Bilateral    cataract extraction  . JOINT REPLACEMENT  shoulder and knees  . KNEE ARTHROSCOPY    . LOBECTOMY  01/31/13   RLL w/squamous cell carcinoma lobectomy  . LUNG REMOVAL, PARTIAL  2014   right lower lobe  . PACEMAKER INSERTION    . PACEMAKER INSERTION  12/2012   Dual chanber pacemaker generator  . partial shoulder replacement Right   . POLYPECTOMY    . PROSTATECTOMY    . TOTAL KNEE ARTHROPLASTY Bilateral   . TOTAL SHOULDER ARTHROPLASTY Left 07/10/2015   Procedure: TOTAL SHOULDER ARTHROPLASTY;  Surgeon: [name], MD;  Location: [facility] ORS;  Service: Orthopedics;  Laterality: Left;  . TOTAL SHOULDER REPLACEMENT      There were no vitals filed for this visit.      Subjective Assessment - 05/05/17 1552    Subjective Patient reports weakness in left LE and is tired today and feeling soreness in lower back. He reports he has been  with increased soreness since following previous session.    Pertinent History Patient has had back pain for years with multiple episodes of exacerbation of symptoms. He reports that most recent episode began summer 2017 following painting a 6' fence and the pain got progressively worse with weakness in LEs. He then has MRI and then surgery 03/16/2017. He has history of TIA with residueal short term memory diysfunction and bilateral shoulder surgeries, bilateral TKR and aortic anuerysm and pacemaker.    Limitations Sitting;Lifting;Walking;Standing;House hold activities   How long can you sit comfortably? >30 min.   How long can you stand comfortably? no longer than 10 min   How long can you walk comfortably? walking with rolling walker short distances   Patient Stated Goals improve strength, walk without AD, return to prior level of function   Currently in Pain? No/denies  no pain on arrival, pain goes to 4-5/10 with movement   Pain Onset --  03/16/2017        Objective: Gait; ambulating independently with rollatorand back brace support in place, slow cadence, guarded posture  Treatment: Therapeutic exercise: patient performed with VC, tactile cues and demonstration of therapist: back brace in place throughout session: Sitting: in chair, with back supported andback brace in place for all exercises  Knee extension through partial ROM with 2# weight on ankles2x 15 (one set tapping balance stones) red resistive band knee flexion 2 x 10reps each LE Hip abduction with green resistive band x 10 reps both and 15 reps single leg with guided ROM red + green abduction x 12 with faigue Resistive DF alternating each foot with red resistive band 2 x 15reps with Mild fatigue noted left foot during second set  UE exercises seated in chair with greenresistive band: with assistance of therapist palloff press forward ( green band) x 15 one time holding still isometric and 1x moving through motion   Bilateral scapular retraction x 20 Single arm row x 15 reps each UE  Standing at counter for support: Walk side stepping x 1 min. With VC for hip, knee alignment and correct technique Standing raise LE and tap balance stones each 15x   Patient response to treatment: Patient required rest periods between exercises, able to complete with minimal VC and assistance. Patient with moderate fatigue following standing exercises and required close supervision and UE support for safety, balance. Improved motor control with repetition.          PT Education - 05/05/17 1630    Education provided Yes   Education Details exercise instruction for sequence, technique  Person(s) Educated Patient   Methods Explanation;Demonstration;Verbal cues   Comprehension Verbalized understanding;Returned demonstration;Verbal cues required             PT Long Term Goals - 04/12/17 1132      PT LONG TERM GOAL #1   Title  Patient will demonstrate improved function with daily tasks and decreased back pain as indicated by MODI score of 40% or better by 05/09/2017    Baseline MODI 58%   Status New     PT LONG TERM GOAL #2   Title  Patient will demonstrate improved function with progressing towards prior level of funciton with daily tasks and decreased back pain as indicated by MODI score of 20% or better by 06/07/2017    Baseline MDI 58%   Status New     PT LONG TERM GOAL #3   Title Patient will demonstrate improved posture awareness and pain control strategies to allow patient to sit/stand for >30 min. by 05/22/2017   Baseline unable to sit or stand >10 - 30 min. without increased pain   Status New     PT LONG TERM GOAL #4   Title Patient will be independent with home program for posture awareness, pain control, progressive exercises to allow patient to transition to self management once discharged from physical therapy 06/07/2017   Baseline limited knowledge of exercises and progrression without  guidance and instruction   Status New               Plan - 05/05/17 1630    Clinical Impression Statement Patient demonstrates improving strength and ability to walk with less difficulty. Patient demonstrates steady progress towards goals with improvement noted in ROM, strength in left LE with exercises. Improved gait pattern demonstrated following modification of exercise intensity and repetitions today.  Patient will benefit from continued physical therapy intervention to address limitations and achieve goals.    Rehab Potential Good   Clinical Impairments Affecting Rehab Potential (+) acute condition s/p surgery, motivated, family support (-)co morbidities; shoulder surgeries, TKR bilateral, aortic anuerysm, pacemaker, CA   PT Frequency 2x / week   PT Duration 8 weeks   PT Treatment/Interventions Therapeutic exercise;Moist Heat;Patient/family education;Manual techniques;Gait training   PT Next Visit Plan progressive exercise, manual STM as needed   PT Home Exercise Plan ROM LE's seated ball rolling under foot, hip adduction, glute sets, core stabilization with resistive bands for scapular rows, palloff press      Patient will benefit from skilled therapeutic intervention in order to improve the following deficits and impairments:  Decreased strength, Decreased activity tolerance, Impaired perceived functional ability, Pain, Increased muscle spasms, Decreased endurance, Decreased range of motion, Difficulty walking  Visit Diagnosis: Muscle weakness (generalized)  Difficulty in walking, not elsewhere classified  Acute low back pain without sciatica, unspecified back pain laterality     Problem List Patient Active Problem List   Diagnosis Date Noted  . Spondylolisthesis of lumbosacral region 03/16/2017  . Low back pain 02/01/2017  . Escherichia coli (E. coli) infection 01/28/2017  . Acute on chronic renal failure ([place]) 01/28/2017  . Elevated troponin 01/28/2017  .  Generalized weakness 01/28/2017  . Essential hypertension 01/28/2017  . UTI (urinary tract infection) 01/26/2017  . AAA (abdominal aortic aneurysm) without rupture ([place]) 11/23/2016  . Chronic obstructive pulmonary disease ([place]) 10/09/2015  . Personal history of diseases of skin or subcutaneous tissue 10/09/2015  . Malignant neoplasm of prostate ([place]) 10/09/2015  . Pure hypercholesterolemia 10/09/2015  . Sinoatrial node dysfunction (HCC)  10/09/2015  . History of surgical procedure 08/22/2015  . Status post total shoulder replacement 07/10/2015  . Arthritis of shoulder region, degenerative 06/10/2015  . Benign essential HTN 06/03/2015  . Absolute anemia 04/12/2015  . Atrophic kidney 04/12/2015  . Essential (primary) hypertension 04/12/2015  . Acid reflux 04/12/2015  . Cancer of lung ([place]) 04/12/2015  . CA of prostate ([place]) 04/12/2015  . H/O adenomatous polyp of colon 01/15/2015  . Temporary cerebral vascular dysfunction 11/21/2014  . Calcific shoulder tendinitis 08/01/2014  . Cervical nerve root disorder 04/07/2012    [name] PT 05/06/2017, 11:32 AM  [facility] [address], [place], [address] Phone: [phone]   Fax:  [phone]  Name: Rick Mcbride. MRN: [id number] Date of Birth: 01/09/1941

## 2017-05-06 ENCOUNTER — Ambulatory Visit (INDEPENDENT_AMBULATORY_CARE_PROVIDER_SITE_OTHER): Payer: PPO | Admitting: Vascular Surgery

## 2017-05-06 ENCOUNTER — Encounter (INDEPENDENT_AMBULATORY_CARE_PROVIDER_SITE_OTHER): Payer: Self-pay | Admitting: Vascular Surgery

## 2017-05-06 ENCOUNTER — Ambulatory Visit (INDEPENDENT_AMBULATORY_CARE_PROVIDER_SITE_OTHER): Payer: PPO

## 2017-05-06 VITALS — BP 136/88 | HR 81 | Resp 16 | Wt 224.0 lb

## 2017-05-06 DIAGNOSIS — E7800 Pure hypercholesterolemia, unspecified: Secondary | ICD-10-CM | POA: Diagnosis not present

## 2017-05-06 DIAGNOSIS — I714 Abdominal aortic aneurysm, without rupture, unspecified: Secondary | ICD-10-CM

## 2017-05-06 DIAGNOSIS — I10 Essential (primary) hypertension: Secondary | ICD-10-CM

## 2017-05-06 NOTE — Assessment & Plan Note (Signed)
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 Doing well after surgery. We discussed that over 95% of type II endoleak's are benign and do not require treatment. Plan to recheck in 6 months with duplex.

## 2017-05-06 NOTE — Progress Notes (Signed)
MRN : [id number]  Rick Mcbride. is a 76 y.o. (15-Dec-1940) male who presents with chief complaint of  Chief Complaint  Patient presents with  . Re-evaluation  .  History of Present Illness: Patient returns today in follow up of AAA. He is about 3-4 months status post endovascular repair of his aneurysm. He has also undergone back surgery and has rehabilitated from that reasonably well. He denies any aneurysm related symptoms. Specifically, the patient denies new back or abdominal pain, or signs of peripheral embolization. 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  Current Outpatient Prescriptions  Medication Sig Dispense Refill  . amLODipine (NORVASC) 5 MG tablet Take 1 tablet (5 mg total) by mouth daily. 7 tablet 0  . aspirin EC 81 MG tablet Take 81 mg by mouth daily.     Marland Kitchen atorvastatin (LIPITOR) 40 MG tablet Take 40 mg by mouth at bedtime.     . bisacodyl (DULCOLAX) 10 MG suppository Place 10 mg rectally daily as needed for moderate constipation.    . Calcium Carbonate-Vitamin D3 (CALCIUM 600-D) 600-400 MG-UNIT TABS Take 1 tablet by mouth daily.    . cetirizine (ZYRTEC) 10 MG tablet Take 10 mg by mouth daily.    . clopidogrel (PLAVIX) 75 MG tablet Take 75 mg by mouth daily.     . cyclobenzaprine (FLEXERIL) 10 MG tablet Take 1 tablet (10 mg total) by mouth 3 (three) times daily as needed for muscle spasms. 30 tablet 0  . docusate sodium (COLACE) 100 MG capsule Take 100 mg by mouth daily.    Marland Kitchen donepezil (ARICEPT) 5 MG tablet Take 5 mg by mouth at bedtime.    . ferrous sulfate (SLOW FE) 160 (50 FE) MG TBCR SR tablet Take 1 tablet by mouth daily.     . Multiple Vitamin (MULTIVITAMIN WITH MINERALS) TABS tablet Take 1 tablet by mouth 2 (two) times daily.    . Multiple  Vitamins-Minerals (PRESERVISION/LUTEIN) CAPS Take 1 capsule by mouth 2 (two) times daily.     . Omega-3 Fatty Acids (FISH OIL) 1200 MG CAPS Take 2 capsules by mouth daily.     Marland Kitchen omeprazole (PRILOSEC) 20 MG capsule Take 20 mg by mouth daily.     Marland Kitchen oxyCODONE (OXYCONTIN) 15 mg 12 hr tablet Take 15 mg by mouth every 12 (twelve) hours.    . predniSONE (DELTASONE) 20 MG tablet Take 40 mg by mouth daily with breakfast.    . senna-docusate (SENOKOT-S) 8.6-50 MG tablet Take 2 tablets by mouth daily.    . traMADol-acetaminophen (ULTRACET) 37.5-325 MG tablet     . vitamin B-12 (CYANOCOBALAMIN) 1000 MCG tablet Take 1,000 mcg by mouth daily.    Marland Kitchen zolpidem (AMBIEN) 10 MG tablet Take 10 mg by mouth at bedtime.     Marland Kitchen oxyCODONE-acetaminophen (PERCOCET) 7.5-325 MG tablet Take 1 tablet by mouth every 4 hours as needed for pain. (Patient not taking: Reported on 05/06/2017) 60 tablet 0   No current facility-administered medications for this visit.     Past Medical History:  Diagnosis Date  . AAA (abdominal aortic aneurysm) ([place])   . AAA (abdominal aortic aneurysm) without rupture ([place])   . Anemia   . Aneurysm ([place])    abd aortic  . Anxiety   .  Atrophic kidney   . Cervical radiculopathy   . Chronic airway obstruction (HCC)    not aware of this  . Chronic kidney disease (CKD), stage III (moderate)    followed by Dr. Johnny Bridge  . Chronic tension headaches   . Coronary artery disease   . Coronary atherosclerosis of autologous vein bypass graft   . DDD (degenerative disc disease), lumbar   . Degenerative disc disease, lumbar    with lumbar radiculopathy  . Dyspnea   . Elbow fracture, left   . GERD (gastroesophageal reflux disease)   . H/O adenomatous polyp of colon   . H/O hemorrhoids   . H/O urticaria   . Headache   . Heart disease   . Hypercholesteremia   . Hyperlipidemia   . 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 Lucky Cowboy  . Lung cancer ([place])   .  Lung cancer ([place])   . Meralgia paresthetica   . Meralgia paresthetica   . Neuralgia   . Osteoarthritis   . Osteoarthritis    s/p L knee surgery  . 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 Presence of permanent cardiac pacemaker   . Prostate cancer ([place])   . Second degree AV block    Followed by Dr. Nehemiah Massed  . Sinoatrial node dysfunction (HCC)   . Status post partial lobectomy of lung    bottom right   . Stroke ([place])   . TIA (transient ischemic attack)     Past Surgical History:  Procedure Laterality Date  . CATARACT EXTRACTION    . COLONOSCOPY    . COLONOSCOPY    . COLONOSCOPY WITH PROPOFOL N/A 10/20/2015   Procedure: COLONOSCOPY WITH PROPOFOL;  Surgeon: Manya Silvas, MD;  Location: [facility] ENDOSCOPY;  Service: Endoscopy;  Laterality: N/A;  . CORONARY ARTERY BYPASS GRAFT     triple  . coronary atherosclerosis of autologous vein bypass graft    . EMBOLIZATION Right 12/27/2016   Procedure: Embolization;  Surgeon: Algernon Huxley, MD;  Location: [place] CV LAB;  Service: Cardiovascular;  Laterality: Right;  . ENDOVASCULAR REPAIR/STENT GRAFT N/A 01/05/2017   Procedure: Endovascular Repair/Stent Graft;  Surgeon: Algernon Huxley, MD;  Location: [place] [facility];  Service: Cardiovascular;  Laterality: N/A;  . EYE SURGERY Bilateral    cataract extraction  . JOINT REPLACEMENT     shoulder and knees  . KNEE ARTHROSCOPY    . LOBECTOMY  01/31/13   RLL w/squamous cell carcinoma lobectomy  . LUNG REMOVAL, PARTIAL  2014   right lower lobe  . PACEMAKER INSERTION    . PACEMAKER INSERTION  12/2012   Dual chanber pacemaker generator  . partial shoulder replacement Right   . POLYPECTOMY    . PROSTATECTOMY    . TOTAL KNEE ARTHROPLASTY Bilateral   . TOTAL SHOULDER ARTHROPLASTY Left 07/10/2015   Procedure: TOTAL SHOULDER ARTHROPLASTY;  Surgeon: [name], MD;  Location: [facility] ORS;  Service: Orthopedics;  Laterality: Left;  . TOTAL SHOULDER REPLACEMENT      Social  History Social History  Substance Use Topics  . Smoking status: Former Smoker    Packs/day: 1.50    Years: 45.00    Quit date: 04/07/2004  . Smokeless tobacco: Never Used  . Alcohol use No    Family History Family History  Problem Relation Age of Onset  . Heart attack Mother   . Heart  attack Father   . Breast cancer Sister   . Asthma Sister     No Known Allergies   REVIEW OF SYSTEMS (Negative unless checked)  Constitutional: []Weight loss  []Fever  []Chills Cardiac: []Chest pain   []Chest pressure   []Palpitations   []Shortness of breath when laying flat   []Shortness of breath at rest   []Shortness of breath with exertion. Vascular:  []Pain in legs with walking   []Pain in legs at rest   []Pain in legs when laying flat   [x]Claudication   []Pain in feet when walking  []Pain in feet at rest  []Pain in feet when laying flat   []History of DVT   []Phlebitis   []Swelling in legs   []Varicose veins   []Non-healing ulcers Pulmonary:   []Uses home oxygen   []Productive cough   []Hemoptysis   []Wheeze  []COPD   []Asthma Neurologic:  []Dizziness  []Blackouts   []Seizures   []History of stroke   []History of TIA  []Aphasia   []Temporary blindness   []Dysphagia   []Weakness or numbness in arms   []Weakness or numbness in legs Musculoskeletal:  [x]Arthritis   []Joint swelling   []Joint pain   [x]Low back pain Hematologic:  []Easy bruising  []Easy bleeding   []Hypercoagulable state   []Anemic   Gastrointestinal:  []Blood in stool   []Vomiting blood  []Gastroesophageal reflux/heartburn   []Abdominal pain Genitourinary:  []Chronic kidney disease   []Difficult urination  [x]Frequent urination  []Burning with urination   []Hematuria Skin:  []Rashes   []Ulcers   []Wounds Psychological:  []History of anxiety   [] History of major depression.  Physical Examination  BP 136/88   Pulse 81   Resp 16   Wt 101.6 kg (224 lb)   BMI 31.24 kg/m  Gen:  WD/WN, NAD Head: [place]/AT, No temporalis  wasting. Ear/Nose/Throat: Hearing grossly intact, nares w/o erythema or drainage, trachea midline Eyes: Conjunctiva clear. Sclera non-icteric Neck: Supple.  No JVD.  Pulmonary:  Good air movement, no use of accessory muscles.  Cardiac: RRR, normal S1, S2 Vascular:  Vessel Right Left  Radial Palpable Palpable                                   Gastrointestinal: Wearing a large back brace  Musculoskeletal: M/S 5/5 throughout.  No deformity or atrophy.  Neurologic: Sensation grossly intact in extremities.  Symmetrical.  Speech is fluent.  Psychiatric: Judgment intact, Mood & affect appropriate for pt's clinical situation. Dermatologic: No rashes or ulcers noted.  No cellulitis or open wounds.       Labs Recent Results (from the past 2160 hour(s))  Type and screen     Status: None   Collection Time: 03/03/17  1:40 PM  Result Value Ref Range   ABO/RH(D) O POS    Antibody Screen NEG    Sample Expiration 03/17/2017    Extend sample reason NO TRANSFUSIONS OR PREGNANCY IN THE PAST 3 MONTHS   ABO/Rh     Status: None   Collection Time: 03/03/17  1:40 PM  Result Value Ref Range   ABO/RH(D) O POS   Surgical pcr screen     Status: None   Collection Time: 03/03/17  2:37 PM  Result Value Ref Range   MRSA, PCR NEGATIVE NEGATIVE   Staphylococcus aureus NEGATIVE NEGATIVE    Comment:        The Xpert  SA Assay (FDA approved for NASAL specimens in patients over 59 years of age), is one component of a comprehensive surveillance program.  Test performance has been validated by [facility] for patients greater than or equal to 75 year old. It is not intended to diagnose infection nor to guide or monitor treatment.   CBC     Status: Abnormal   Collection Time: 03/17/17  3:21 AM  Result Value Ref Range   WBC 7.2 4.0 - 10.5 K/uL   RBC 3.65 (L) 4.22 - 5.81 MIL/uL   Hemoglobin 10.3 (L) 13.0 - 17.0 g/dL   HCT 32.2 (L) 39.0 - 52.0 %   MCV 88.2 78.0 - 100.0 fL   MCH 28.2 26.0 - 34.0  pg   MCHC 32.0 30.0 - 36.0 g/dL   RDW 14.4 11.5 - 15.5 %   Platelets 163 150 - 400 K/uL  Basic Metabolic Panel     Status: Abnormal   Collection Time: 03/17/17  3:21 AM  Result Value Ref Range   Sodium 138 135 - 145 mmol/L   Potassium 4.4 3.5 - 5.1 mmol/L   Chloride 107 101 - 111 mmol/L   CO2 25 22 - 32 mmol/L   Glucose, Bld 106 (H) 65 - 99 mg/dL   BUN 20 6 - 20 mg/dL   Creatinine, Ser 2.09 (H) 0.61 - 1.24 mg/dL   Calcium 8.5 (L) 8.9 - 10.3 mg/dL   GFR calc non Af Amer 29 (L) >60 mL/min   GFR calc Af Amer 34 (L) >60 mL/min    Comment: (NOTE) The eGFR has been calculated using the CKD EPI equation. This calculation has not been validated in all clinical situations. eGFR's persistently <60 mL/min signify possible Chronic Kidney Disease.    Anion gap 6 5 - 15  Lactic acid, plasma     Status: None   Collection Time: 03/26/17  5:19 AM  Result Value Ref Range   Lactic Acid, Venous 1.3 0.5 - 1.9 mmol/L  Comprehensive metabolic panel     Status: Abnormal   Collection Time: 03/26/17  5:19 AM  Result Value Ref Range   Sodium 137 135 - 145 mmol/L   Potassium 4.1 3.5 - 5.1 mmol/L   Chloride 101 101 - 111 mmol/L   CO2 26 22 - 32 mmol/L   Glucose, Bld 124 (H) 65 - 99 mg/dL   BUN 30 (H) 6 - 20 mg/dL   Creatinine, Ser 2.14 (H) 0.61 - 1.24 mg/dL   Calcium 8.9 8.9 - 10.3 mg/dL   Total Protein 6.6 6.5 - 8.1 g/dL   Albumin 3.4 (L) 3.5 - 5.0 g/dL   AST 19 15 - 41 U/L   ALT 13 (L) 17 - 63 U/L   Alkaline Phosphatase 60 38 - 126 U/L   Total Bilirubin 0.9 0.3 - 1.2 mg/dL   GFR calc non Af Amer 28 (L) >60 mL/min   GFR calc Af Amer 33 (L) >60 mL/min    Comment: (NOTE) The eGFR has been calculated using the CKD EPI equation. This calculation has not been validated in all clinical situations. eGFR's persistently <60 mL/min signify possible Chronic Kidney Disease.    Anion gap 10 5 - 15  Lipase, blood     Status: None   Collection Time: 03/26/17  5:19 AM  Result Value Ref Range   Lipase 18  11 - 51 U/L  Brain natriuretic peptide - IF patient is dyspneic     Status: Abnormal   Collection Time:  03/26/17  5:19 AM  Result Value Ref Range   B Natriuretic Peptide 572.0 (H) 0.0 - 100.0 pg/mL  Troponin I     Status: Abnormal   Collection Time: 03/26/17  5:19 AM  Result Value Ref Range   Troponin I 0.03 (HH) <0.03 ng/mL    Comment: CRITICAL RESULT CALLED TO, READ BACK BY AND VERIFIED WITH REBECCA LYNN 03/26/17 0628 SGD   CBC WITH DIFFERENTIAL     Status: Abnormal   Collection Time: 03/26/17  5:19 AM  Result Value Ref Range   WBC 10.0 3.8 - 10.6 K/uL   RBC 3.89 (L) 4.40 - 5.90 MIL/uL   Hemoglobin 11.3 (L) 13.0 - 18.0 g/dL   HCT 33.5 (L) 40.0 - 52.0 %   MCV 86.1 80.0 - 100.0 fL   MCH 29.1 26.0 - 34.0 pg   MCHC 33.8 32.0 - 36.0 g/dL   RDW 14.7 (H) 11.5 - 14.5 %   Platelets 223 150 - 440 K/uL   Neutrophils Relative % 82 %   Neutro Abs 8.2 (H) 1.4 - 6.5 K/uL   Lymphocytes Relative 8 %   Lymphs Abs 0.7 (L) 1.0 - 3.6 K/uL   Monocytes Relative 9 %   Monocytes Absolute 0.9 0.2 - 1.0 K/uL   Eosinophils Relative 1 %   Eosinophils Absolute 0.1 0 - 0.7 K/uL   Basophils Relative 0 %   Basophils Absolute 0.0 0 - 0.1 K/uL  Procalcitonin     Status: None   Collection Time: 03/26/17  5:19 AM  Result Value Ref Range   Procalcitonin <0.10 ng/mL    Comment:        Interpretation: PCT (Procalcitonin) <= 0.5 ng/mL: Systemic infection (sepsis) is not likely. Local bacterial infection is possible. (NOTE)         ICU PCT Algorithm               Non ICU PCT Algorithm    ----------------------------     ------------------------------         PCT < 0.25 ng/mL                 PCT < 0.1 ng/mL     Stopping of antibiotics            Stopping of antibiotics       strongly encouraged.               strongly encouraged.    ----------------------------     ------------------------------       PCT level decrease by               PCT < 0.25 ng/mL       >= 80% from peak PCT       OR PCT 0.25 - 0.5  ng/mL          Stopping of antibiotics                                             encouraged.     Stopping of antibiotics           encouraged.    ----------------------------     ------------------------------       PCT level decrease by              PCT >= 0.25 ng/mL       < 80% from peak PCT  AND PCT >= 0.5 ng/mL            Continuin g antibiotics                                              encouraged.       Continuing antibiotics            encouraged.    ----------------------------     ------------------------------     PCT level increase compared          PCT > 0.5 ng/mL         with peak PCT AND          PCT >= 0.5 ng/mL             Escalation of antibiotics                                          strongly encouraged.      Escalation of antibiotics        strongly encouraged.   Protime-INR     Status: None   Collection Time: 03/26/17  5:19 AM  Result Value Ref Range   Prothrombin Time 12.6 11.4 - 15.2 seconds   INR 0.94   Blood Culture (routine x 2)     Status: None   Collection Time: 03/26/17  5:19 AM  Result Value Ref Range   Specimen Description BLOOD LEFT ANTECUBITAL    Special Requests      BOTTLES DRAWN AEROBIC AND ANAEROBIC Blood Culture results may not be optimal due to an excessive volume of blood received in culture bottles   Culture NO GROWTH 5 DAYS    Report Status 03/31/2017 FINAL   Urinalysis, Complete w Microscopic     Status: Abnormal   Collection Time: 03/26/17  5:19 AM  Result Value Ref Range   Color, Urine YELLOW (A) YELLOW   APPearance CLEAR (A) CLEAR   Specific Gravity, Urine 1.011 1.005 - 1.030   pH 6.0 5.0 - 8.0   Glucose, UA NEGATIVE NEGATIVE mg/dL   Hgb urine dipstick NEGATIVE NEGATIVE   Bilirubin Urine NEGATIVE NEGATIVE   Ketones, ur NEGATIVE NEGATIVE mg/dL   Protein, ur NEGATIVE NEGATIVE mg/dL   Nitrite NEGATIVE NEGATIVE   Leukocytes, UA NEGATIVE NEGATIVE   RBC / HPF 0-5 0 - 5 RBC/hpf   WBC, UA 0-5 0 - 5 WBC/hpf   Bacteria,  UA NONE SEEN NONE SEEN   Squamous Epithelial / LPF 0-5 (A) NONE SEEN   Mucous PRESENT   Urine culture     Status: Abnormal   Collection Time: 03/26/17  5:19 AM  Result Value Ref Range   Specimen Description URINE, RANDOM    Special Requests NONE    Culture MULTIPLE SPECIES PRESENT, SUGGEST RECOLLECTION (A)    Report Status 03/27/2017 FINAL   Blood Culture (routine x 2)     Status: None   Collection Time: 03/26/17  5:20 AM  Result Value Ref Range   Specimen Description BLOOD RIGHT ANTECUBITAL    Special Requests      BOTTLES DRAWN AEROBIC AND ANAEROBIC Blood Culture adequate volume   Culture NO GROWTH 5 DAYS    Report Status 03/31/2017 FINAL   Troponin I     Status: Abnormal  Collection Time: 03/26/17  9:03 AM  Result Value Ref Range   Troponin I 0.03 (HH) <0.03 ng/mL    Comment: CRITICAL VALUE NOTED. VALUE IS CONSISTENT WITH PREVIOUSLY REPORTED/CALLED VALUE [facility]    Radiology No results found.    Assessment/Plan  Benign essential HTN blood pressure control important in reducing the progression of atherosclerotic disease and aneurysmal disease. On appropriate oral medications.   AAA (abdominal aortic aneurysm) without rupture (HCC)  His duplex today shows a patent stent graft with what appears to be a small type II endoleak. 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 Doing well after surgery. We discussed that over 95% of type II endoleak's are benign and do not require treatment. Plan to recheck in 6 months with duplex.    [name], MD  05/06/2017 10:21 AM    This note was created with Dragon medical transcription system.  Any errors from dictation are purely unintentional

## 2017-05-06 NOTE — Assessment & Plan Note (Signed)
blood pressure control important in reducing the progression of atherosclerotic disease and aneurysmal disease. On appropriate oral medications.  

## 2017-05-06 NOTE — Patient Instructions (Signed)
Abdominal Aortic Aneurysm Blood pumps away from the heart through tubes (blood vessels) called arteries. Aneurysms are weak or damaged places in the wall of an artery. It bulges out like a balloon. An abdominal aortic aneurysm happens in the main artery of the body (aorta). It can burst or tear, causing bleeding inside the body. This is an emergency. It needs treatment right away. What are the causes? The exact cause is unknown. Things that could cause this problem include:  Fat and other substances building up in the lining of a tube.  Swelling of the walls of a blood vessel.  Certain tissue diseases.  Belly (abdominal) trauma.  An infection in the main artery of the body.  What increases the risk? There are things that make it more likely for you to have an aneurysm. These include:  Being over the age of 76 years old.  Having high blood pressure (hypertension).  Being a male.  Being white.  Being very overweight (obese).  Having a family history of aneurysm.  Using tobacco products.  What are the signs or symptoms? Symptoms depend on the size of the aneurysm and how fast it grows. There may not be symptoms. If symptoms occur, they can include:  Pain (belly, side, lower back, or groin).  Feeling full after eating a small amount of food.  Feeling sick to your stomach (nauseous), throwing up (vomiting), or both.  Feeling a lump in your belly that feels like it is beating (pulsating).  Feeling like you will pass out (faint).  How is this treated?  Medicine to control blood pressure and pain.  Imaging tests to see if the aneurysm gets bigger.  Surgery. How is this prevented? To lessen your chance of getting this condition:  Stop smoking. Stop chewing tobacco.  Limit or avoid alcohol.  Keep your blood pressure, blood sugar, and cholesterol within normal limits.  Eat less salt.  Eat foods low in saturated fats and cholesterol. These are found in animal and  whole dairy products.  Eat more fiber. Fiber is found in whole grains, vegetables, and fruits.  Keep a healthy weight.  Stay active and exercise often.  This information is not intended to replace advice given to you by your health care provider. Make sure you discuss any questions you have with your health care provider. Document Released: 02/19/2013 Document Revised: 04/01/2016 Document Reviewed: 11/24/2012 Elsevier Interactive Patient Education  2017 [employer]  

## 2017-05-10 ENCOUNTER — Ambulatory Visit: Payer: PPO | Attending: Neurosurgery | Admitting: Physical Therapy

## 2017-05-10 ENCOUNTER — Encounter: Payer: Self-pay | Admitting: Physical Therapy

## 2017-05-10 DIAGNOSIS — M545 Low back pain, unspecified: Secondary | ICD-10-CM

## 2017-05-10 DIAGNOSIS — R262 Difficulty in walking, not elsewhere classified: Secondary | ICD-10-CM

## 2017-05-10 DIAGNOSIS — M6281 Muscle weakness (generalized): Secondary | ICD-10-CM

## 2017-05-10 NOTE — Therapy (Signed)
[facility] [address], [place], [address] Phone: [phone]   Fax:  [phone]  Physical Therapy Treatment  Patient Details  Name: Rick Mcbride. MRN: [id number] Date of Birth: 02-20-1941 Referring Provider: Ophelia Charter MD  Encounter Date: 05/10/2017      PT End of Session - 05/10/17 1622    Visit Number 7   Number of Visits 16   Date for PT Re-Evaluation 06/07/17   Authorization Type 7   Authorization Time Period 10 (G code)   PT Start Time 2482   PT Stop Time 1555   PT Time Calculation (min) 40 min   Activity Tolerance Patient tolerated treatment well   Behavior During Therapy [facility] for tasks assessed/performed      Past Medical History:  Diagnosis Date  . AAA (abdominal aortic aneurysm) ([place])   . AAA (abdominal aortic aneurysm) without rupture ([place])   . Anemia   . Aneurysm ([place])    abd aortic  . Anxiety   . Atrophic kidney   . Cervical radiculopathy   . Chronic airway obstruction (HCC)    not aware of this  . Chronic kidney disease (CKD), stage III (moderate)    followed by Dr. Johnny Bridge  . Chronic tension headaches   . Coronary artery disease   . Coronary atherosclerosis of autologous vein bypass graft   . DDD (degenerative disc disease), lumbar   . Degenerative disc disease, lumbar    with lumbar radiculopathy  . Dyspnea   . Elbow fracture, left   . GERD (gastroesophageal reflux disease)   . H/O adenomatous polyp of colon   . H/O hemorrhoids   . H/O urticaria   . Headache   . Heart disease   . Hypercholesteremia   . Hyperlipidemia   . 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 Lucky Cowboy  . Lung cancer ([place])   . Lung cancer ([place])   . Meralgia paresthetica   . Meralgia paresthetica   . Neuralgia   . Osteoarthritis   . Osteoarthritis    s/p L knee surgery  . 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 Presence of permanent cardiac pacemaker   . Prostate cancer ([place])   . Second degree AV block    Followed by Dr. Nehemiah Massed  . Sinoatrial node dysfunction (HCC)   . Status post partial lobectomy of lung    bottom right   . Stroke ([place])   . TIA (transient ischemic attack)     Past Surgical History:  Procedure Laterality Date  . CATARACT EXTRACTION    . COLONOSCOPY    . COLONOSCOPY    . COLONOSCOPY WITH PROPOFOL N/A 10/20/2015   Procedure: COLONOSCOPY WITH PROPOFOL;  Surgeon: Manya Silvas, MD;  [facility] END[facility];  Service: Endoscopy;  Laterality: N/A;  . CORONARY ARTERY BYPASS GRAFT     triple  . coronary atherosclerosis of autologous vein bypass graft    . EMBOLIZATION Right 12/27/2016   Procedure: Embolization;  Surgeon: Algernon Huxley, MD;  Location: [place] [facility];  Service: Cardiovascular;  Laterality: Right;  . ENDOVASCULAR REPAIR/STENT GRAFT N/A 01/05/2017   Procedure: Endovascular Repair/Stent Graft;  Surgeon: Algernon Huxley, MD;  Location: [place] [facility];  Service: Cardiovascular;  Laterality: N/A;  . EYE SURGERY Bilateral    cataract extraction  . JOINT REPLACEMENT  shoulder and knees  . KNEE ARTHROSCOPY    . LOBECTOMY  01/31/13   RLL w/squamous cell carcinoma lobectomy  . LUNG REMOVAL, PARTIAL  2014   right lower lobe  . PACEMAKER INSERTION    . PACEMAKER INSERTION  12/2012   Dual chanber pacemaker generator  . partial shoulder replacement Right   . POLYPECTOMY    . PROSTATECTOMY    . TOTAL KNEE ARTHROPLASTY Bilateral   . TOTAL SHOULDER ARTHROPLASTY Left 07/10/2015   Procedure: TOTAL SHOULDER ARTHROPLASTY;  Surgeon: [name], MD;  Location: [facility] ORS;  Service: Orthopedics;  Laterality: Left;  . TOTAL SHOULDER REPLACEMENT      There were no vitals filed for this visit.      Subjective Assessment - 05/10/17 1523    Subjective Patient with lower back pain today and fatigued from doing a bit much over the weekend (birthday party).     Pertinent History Patient has had back pain for years with multiple episodes of exacerbation of symptoms. He reports that most recent episode began summer 2017 following painting a 6' fence and the pain got progressively worse with weakness in LEs. He then has MRI and then surgery 03/16/2017. He has history of TIA with residueal short term memory diysfunction and bilateral shoulder surgeries, bilateral TKR and aortic anuerysm and pacemaker.    Limitations Sitting;Lifting;Walking;Standing;House hold activities   How long can you sit comfortably? >30 min.   How long can you stand comfortably? no longer than 10 min   How long can you walk comfortably? walking with rolling walker short distances   Patient Stated Goals improve strength, walk without AD, return to prior level of function   Currently in Pain? Yes   Pain Score 5    Pain Location Back   Pain Orientation Lower;Left   Pain Descriptors / Indicators Tiring   Pain Type Acute pain;Surgical pain  03/16/2017   Pain Onset More than a month ago  03/16/2017   Pain Frequency Intermittent          Objective: Gait; ambulating independently with rollatorand back brace support in place, slow cadence, guarded posture  Treatment: Therapeutic exercise: patient performed with VC, tactile cues and demonstration of therapist: back brace in place throughout session: Sitting: in chair, with back supported andback brace in place for all exercises  Knee extension through partial ROM with 21/2# weight on ankles2x 15 (one set tapping balance stones) greenresistive band knee flexion 2 x 15reps each LE Hip abduction with green + red resistive band x 10 reps both and 15 reps green band  single leg with guided ROM Resistive DF alternating each foot with red resistive band 1 x 15reps with Mild fatigue noted left foot during second set  UE exercises seated in chair with resistive band: with assistance of therapist palloff press forward ( green +red   band)x 15  Bilateral scapular retraction x 20 green + red band Single arm row x 15 reps each UE green + red band  Standing at counter for support: Walk side stepping x 1 min. With VC for hip, knee alignment and correct technique, on airex balance beam, contact guard for safety and using UE support on counter for balance, safety Step ups onto airex balance pad leading 10x with each LE with VC and contact guard for safety, UE's on counter for support/balance   Patient response to treatment: Patient improved motor control, strength and endurance with repetition. He required minimal rest periods between exercises, sets. Moderate fatigue  reported following all exercises.         PT Education - 05/10/17 1540    Education provided Yes   Education Details exercise instruciton, technique, sequencing   Person(s) Educated Patient   Methods Explanation;Demonstration;Verbal cues   Comprehension Returned demonstration;Verbal cues required;Verbalized understanding             PT Long Term Goals - 04/12/17 1132      PT LONG TERM GOAL #1   Title  Patient will demonstrate improved function with daily tasks and decreased back pain as indicated by MODI score of 40% or better by 05/09/2017    Baseline MODI 58%   Status New     PT LONG TERM GOAL #2   Title  Patient will demonstrate improved function with progressing towards prior level of funciton with daily tasks and decreased back pain as indicated by MODI score of 20% or better by 06/07/2017    Baseline MDI 58%   Status New     PT LONG TERM GOAL #3   Title Patient will demonstrate improved posture awareness and pain control strategies to allow patient to sit/stand for >30 min. by 05/22/2017   Baseline unable to sit or stand >10 - 30 min. without increased pain   Status New     PT LONG TERM GOAL #4   Title Patient will be independent with home program for posture awareness, pain control, progressive exercises to allow patient to transition  to self management once discharged from physical therapy 06/07/2017   Baseline limited knowledge of exercises and progrression without guidance and instruction   Status New               Plan - 05/10/17 1623    Clinical Impression Statement Patient demonstrates improving endurance, strength with increased intensity and duration of exercises with minimal rest periods between sets of exercises. Patient demonstrates steady progress towards goals with improvement noted in ROM, strength, endurance. He continues with weakness and difficulty with walking, requires rollator for support. Patient will benefit from continued physical therapy intervention to address limitations and achieve goals.    Rehab Potential Good   Clinical Impairments Affecting Rehab Potential (+) acute condition s/p surgery, motivated, family support (-)co morbidities; shoulder surgeries, TKR bilateral, aortic anuerysm, pacemaker, CA   PT Frequency 2x / week   PT Duration 8 weeks   PT Treatment/Interventions Therapeutic exercise;Moist Heat;Patient/family education;Manual techniques;Gait training   PT Next Visit Plan progressive exercise, manual STM as needed   PT Home Exercise Plan ROM LE's seated ball rolling under foot, hip adduction, glute sets, core stabilization with resistive bands for scapular rows, palloff press      Patient will benefit from skilled therapeutic intervention in order to improve the following deficits and impairments:  Decreased strength, Decreased activity tolerance, Impaired perceived functional ability, Pain, Increased muscle spasms, Decreased endurance, Decreased range of motion, Difficulty walking  Visit Diagnosis: Muscle weakness (generalized)  Difficulty in walking, not elsewhere classified  Acute low back pain without sciatica, unspecified back pain laterality     Problem List Patient Active Problem List   Diagnosis Date Noted  . Spondylolisthesis of lumbosacral region 03/16/2017   . Low back pain 02/01/2017  . Escherichia coli (E. coli) infection 01/28/2017  . Acute on chronic renal failure ([place]) 01/28/2017  . Elevated troponin 01/28/2017  . Generalized weakness 01/28/2017  . Essential hypertension 01/28/2017  . UTI (urinary tract infection) 01/26/2017  . AAA (abdominal aortic aneurysm) without rupture ([place]) 11/23/2016  .  Chronic obstructive pulmonary disease ([place]) 10/09/2015  . Personal history of diseases of skin or subcutaneous tissue 10/09/2015  . Malignant neoplasm of prostate ([place]) 10/09/2015  . Pure hypercholesterolemia 10/09/2015  . Sinoatrial node dysfunction ([place]) 10/09/2015  . History of surgical procedure 08/22/2015  . Status post total shoulder replacement 07/10/2015  . Arthritis of shoulder region, degenerative 06/10/2015  . Benign essential HTN 06/03/2015  . Absolute anemia 04/12/2015  . Atrophic kidney 04/12/2015  . Essential (primary) hypertension 04/12/2015  . Acid reflux 04/12/2015  . Cancer of lung ([place]) 04/12/2015  . CA of prostate ([place]) 04/12/2015  . H/O adenomatous polyp of colon 01/15/2015  . Temporary cerebral vascular dysfunction 11/21/2014  . Calcific shoulder tendinitis 08/01/2014  . Cervical nerve root disorder 04/07/2012    Rick Mcbride PT 05/10/2017, 4:31 PM  [facility] [address], [place], [address] Phone: [phone]   Fax:  [phone]  Name: Rick Mcbride. MRN: [id number] Date of Birth: Mar 08, 1941

## 2017-05-12 ENCOUNTER — Encounter: Payer: Self-pay | Admitting: Physical Therapy

## 2017-05-12 ENCOUNTER — Ambulatory Visit: Payer: PPO | Admitting: Physical Therapy

## 2017-05-12 DIAGNOSIS — M545 Low back pain, unspecified: Secondary | ICD-10-CM

## 2017-05-12 DIAGNOSIS — R262 Difficulty in walking, not elsewhere classified: Secondary | ICD-10-CM

## 2017-05-12 DIAGNOSIS — M6281 Muscle weakness (generalized): Secondary | ICD-10-CM | POA: Diagnosis not present

## 2017-05-12 NOTE — Therapy (Signed)
[facility] [address] [place], [address] Phone: [phone]   Fax:  [phone]  Physical Therapy Treatment  Patient Details  Name: Rick Mcbride. MRN: [id number] Date of Birth: May 17, 1941 Referring Provider: Ophelia Charter MD  Encounter Date: 05/12/2017      PT End of Session - 05/12/17 1551    Visit Number 8   Number of Visits 16   Date for PT Re-Evaluation 06/07/17   Authorization Type 8   Authorization Time Period 10 (G code)   PT Start Time 1508   PT Stop Time 1550   PT Time Calculation (min) 42 min   Activity Tolerance Patient tolerated treatment well   Behavior During Therapy [facility] for tasks assessed/performed      Past Medical History:  Diagnosis Date  . AAA (abdominal aortic aneurysm) ([place])   . AAA (abdominal aortic aneurysm) without rupture ([place])   . Anemia   . Aneurysm ([place])    abd aortic  . Anxiety   . Atrophic kidney   . Cervical radiculopathy   . Chronic airway obstruction (HCC)    not aware of this  . Chronic kidney disease (CKD), stage III (moderate)    followed by Dr. Johnny Bridge  . Chronic tension headaches   . Coronary artery disease   . Coronary atherosclerosis of autologous vein bypass graft   . DDD (degenerative disc disease), lumbar   . Degenerative disc disease, lumbar    with lumbar radiculopathy  . Dyspnea   . Elbow fracture, left   . GERD (gastroesophageal reflux disease)   . H/O adenomatous polyp of colon   . H/O hemorrhoids   . H/O urticaria   . Headache   . Heart disease   . Hypercholesteremia   . Hyperlipidemia   . 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 Lucky Cowboy  . Lung cancer ([place])   . Lung cancer ([place])   . Meralgia paresthetica   . Meralgia paresthetica   . Neuralgia   . Osteoarthritis   . Osteoarthritis    s/p L knee surgery  . 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 Presence of permanent cardiac pacemaker   . Prostate cancer ([place])   . Second degree AV block    Followed by Dr. Nehemiah Massed  . Sinoatrial node dysfunction (HCC)   . Status post partial lobectomy of lung    bottom right   . Stroke ([place])   . TIA (transient ischemic attack)     Past Surgical History:  Procedure Laterality Date  . CATARACT EXTRACTION    . COLONOSCOPY    . COLONOSCOPY    . COLONOSCOPY WITH PROPOFOL N/A 10/20/2015   Procedure: COLONOSCOPY WITH PROPOFOL;  Surgeon: Manya Silvas, MD;  Location: [facility] ENDOSCOPY;  Service: Endoscopy;  Laterality: N/A;  . CORONARY ARTERY BYPASS GRAFT     triple  . coronary atherosclerosis of autologous vein bypass graft    . EMBOLIZATION Right 12/27/2016   Procedure: Embolization;  Surgeon: Algernon Huxley, MD;  Location: [place];  Service: Cardiovascular;  Laterality: Right;  . ENDOVASCULAR REPAIR/STENT GRAFT N/A 01/05/2017   Procedure: Endovascular Repair/Stent Graft;  Surgeon: Algernon Huxley, MD;  Location: [place] [facility];  Service: Cardiovascular;  Laterality: N/A;  . EYE SURGERY Bilateral    cataract extraction  . JOINT REPLACEMENT  shoulder and knees  . KNEE ARTHROSCOPY    . LOBECTOMY  01/31/13   RLL w/squamous cell carcinoma lobectomy  . LUNG REMOVAL, PARTIAL  2014   right lower lobe  . PACEMAKER INSERTION    . PACEMAKER INSERTION  12/2012   Dual chanber pacemaker generator  . partial shoulder replacement Right   . POLYPECTOMY    . PROSTATECTOMY    . TOTAL KNEE ARTHROPLASTY Bilateral   . TOTAL SHOULDER ARTHROPLASTY Left 07/10/2015   Procedure: TOTAL SHOULDER ARTHROPLASTY;  Surgeon: [name], MD;  Location: [facility] ORS;  Service: Orthopedics;  Laterality: Left;  . TOTAL SHOULDER REPLACEMENT      There were no vitals filed for this visit.      Subjective Assessment - 05/12/17 1509    Subjective Patient reports he had tightness in his left leg last night. He reports improvement in the numbness in his feet as  well. He is ambulating with walker for confidence and safety and is not using back brace at home   Pertinent History Patient has had back pain for years with multiple episodes of exacerbation of symptoms. He reports that most recent episode began summer 2017 following painting a 6' fence and the pain got progressively worse with weakness in LEs. He then has MRI and then surgery 03/16/2017. He has history of TIA with residueal short term memory diysfunction and bilateral shoulder surgeries, bilateral TKR and aortic anuerysm and pacemaker.    Limitations Sitting;Lifting;Walking;Standing;House hold activities   How long can you sit comfortably? >30 min.   How long can you stand comfortably? no longer than 10 min   How long can you walk comfortably? walking with rolling walker short distances   Patient Stated Goals improve strength, walk without AD, return to prior level of function   Currently in Pain? Yes   Pain Score 3    Pain Location Back   Pain Orientation Lower;Left   Pain Descriptors / Indicators Sore   Pain Type Acute pain;Surgical pain  03/16/2017   Pain Onset More than a month ago  03/16/2017   Pain Frequency Intermittent       Objective: Gait; ambulating independently with rollatorand back brace support in place, more erect posture and improved cadence as compared to previous session Strength left LE hip flexion 4-/5, knee extension, flexion 4-/5, ankle DF 3+/5   Treatment: Therapeutic exercise: patient performed with VC, tactile cues and demonstration of therapist: back brace in place throughout session: Sitting: in chair, with back supported andback brace in place for all exercises Rocker board forward and back and side to side   Knee extension through partial ROM with 21/2# weight on ankles2x 15  greenresistive band knee flexion 2 x 15reps each LE Hip abduction with green + red resistive band x 10reps both  Resistive DF alternating each foot with green resistive band 1 x  20reps with mild fatigue noted left foot   UE exercises seated in chair with resistive band: with assistance of therapist palloff press forward ( green +red  band)x 15  Bilateral scapular retraction x 20 green + red band Single arm row x 15 reps each UE green + red band  Standing at counter for support: Walk side stepping x 1 1/2 min. with VC for hip, knee alignment and correct technique, on airex balance beam, contact guard for safety and using UE support on counter for balance, safety Step ups forward and lateral onto airex balance beam leading 10x with each LE with VC  and contact guard for safety, UE's on counter for support/balance  Patient response to treatment: Patient demonstrated improved technique and improved motor control with moderate VC and repeated demonstration. Patient demonstrated improved endurance with minimal rest periods between exercises. Moderate fatigue reported following all exercises.         PT Education - 05/12/17 1520    Education provided Yes   Education Details instruction in proper technqiue and alignment with exercises   Person(s) Educated Patient   Methods Explanation;Demonstration;Verbal cues   Comprehension Verbalized understanding;Returned demonstration             PT Long Term Goals - 04/12/17 1132      PT LONG TERM GOAL #1   Title  Patient will demonstrate improved function with daily tasks and decreased back pain as indicated by MODI score of 40% or better by 05/09/2017    Baseline MODI 58%   Status New     PT LONG TERM GOAL #2   Title  Patient will demonstrate improved function with progressing towards prior level of funciton with daily tasks and decreased back pain as indicated by MODI score of 20% or better by 06/07/2017    Baseline MDI 58%   Status New     PT LONG TERM GOAL #3   Title Patient will demonstrate improved posture awareness and pain control strategies to allow patient to sit/stand for >30 min. by 05/22/2017    Baseline unable to sit or stand >10 - 30 min. without increased pain   Status New     PT LONG TERM GOAL #4   Title Patient will be independent with home program for posture awareness, pain control, progressive exercises to allow patient to transition to self management once discharged from physical therapy 06/07/2017   Baseline limited knowledge of exercises and progrression without guidance and instruction   Status New               Plan - 05/12/17 1555    Clinical Impression Statement Patient demonstrates improving strength and endurance with current treatment. Patient demonstrates steady progress towards goals with improvement noted in  strength, endurance. Improved gait pattern demonstrated with improved posture and cadence and ability to control left LE, foot DF to clear foot.. Patient will benefit from continued physical therapy intervention to address limitations and achieve goals.    Rehab Potential Good   Clinical Impairments Affecting Rehab Potential (+) acute condition s/p surgery, motivated, family support (-)co morbidities; shoulder surgeries, TKR bilateral, aortic anuerysm, pacemaker, CA   PT Frequency 2x / week   PT Duration 8 weeks   PT Treatment/Interventions Therapeutic exercise;Moist Heat;Patient/family education;Manual techniques;Gait training   PT Next Visit Plan progressive exercise, manual STM as needed   PT Home Exercise Plan ROM LE's seated ball rolling under foot, hip adduction, glute sets, core stabilization with resistive bands for scapular rows, palloff press      Patient will benefit from skilled therapeutic intervention in order to improve the following deficits and impairments:  Decreased strength, Decreased activity tolerance, Impaired perceived functional ability, Pain, Increased muscle spasms, Decreased endurance, Decreased range of motion, Difficulty walking  Visit Diagnosis: Muscle weakness (generalized)  Difficulty in walking, not elsewhere  classified  Acute low back pain without sciatica, unspecified back pain laterality     Problem List Patient Active Problem List   Diagnosis Date Noted  . Spondylolisthesis of lumbosacral region 03/16/2017  . Low back pain 02/01/2017  . Escherichia coli (E. coli) infection 01/28/2017  . Acute  on chronic renal failure ([place]) 01/28/2017  . Elevated troponin 01/28/2017  . Generalized weakness 01/28/2017  . Essential hypertension 01/28/2017  . UTI (urinary tract infection) 01/26/2017  . AAA (abdominal aortic aneurysm) without rupture ([place]) 11/23/2016  . Chronic obstructive pulmonary disease ([place]) 10/09/2015  . Personal history of diseases of skin or subcutaneous tissue 10/09/2015  . Malignant neoplasm of prostate ([place]) 10/09/2015  . Pure hypercholesterolemia 10/09/2015  . Sinoatrial node dysfunction ([place]) 10/09/2015  . History of surgical procedure 08/22/2015  . Status post total shoulder replacement 07/10/2015  . Arthritis of shoulder region, degenerative 06/10/2015  . Benign essential HTN 06/03/2015  . Absolute anemia 04/12/2015  . Atrophic kidney 04/12/2015  . Essential (primary) hypertension 04/12/2015  . Acid reflux 04/12/2015  . Cancer of lung ([place]) 04/12/2015  . CA of prostate ([place]) 04/12/2015  . H/O adenomatous polyp of colon 01/15/2015  . Temporary cerebral vascular dysfunction 11/21/2014  . Calcific shoulder tendinitis 08/01/2014  . Cervical nerve root disorder 04/07/2012    [name] PT 05/13/2017, 8:54 AM  [facility] [address] [place], [address] Phone: [phone]   Fax:  [phone]  Name: Corion Sherrod. MRN: [id number] Date of Birth: 1941-05-01

## 2017-05-17 ENCOUNTER — Ambulatory Visit: Payer: PPO | Admitting: Physical Therapy

## 2017-05-17 ENCOUNTER — Encounter: Payer: Self-pay | Admitting: Physical Therapy

## 2017-05-17 DIAGNOSIS — M545 Low back pain, unspecified: Secondary | ICD-10-CM

## 2017-05-17 DIAGNOSIS — R262 Difficulty in walking, not elsewhere classified: Secondary | ICD-10-CM

## 2017-05-17 DIAGNOSIS — M6281 Muscle weakness (generalized): Secondary | ICD-10-CM | POA: Diagnosis not present

## 2017-05-17 NOTE — Therapy (Signed)
[facility] [address], [place], [address] Phone: [phone]   Fax:  [phone]  Physical Therapy Treatment  Patient Details  Name: Rick Mcbride. MRN: [id number] Date of Birth: 1941/04/28 Referring Provider: Ophelia Charter MD  Encounter Date: 05/17/2017      PT End of Session - 05/17/17 1600    Visit Number 9   Number of Visits 16   Date for PT Re-Evaluation 06/07/17   Authorization Type 9   Authorization Time Period 10 (G code)   PT Start Time 1508   PT Stop Time 1555   PT Time Calculation (min) 47 min   Activity Tolerance Patient tolerated treatment well   Behavior During Therapy [facility] for tasks assessed/performed      Past Medical History:  Diagnosis Date  . AAA (abdominal aortic aneurysm) ([place])   . AAA (abdominal aortic aneurysm) without rupture ([place])   . Anemia   . Aneurysm ([place])    abd aortic  . Anxiety   . Atrophic kidney   . Cervical radiculopathy   . Chronic airway obstruction (HCC)    not aware of this  . Chronic kidney disease (CKD), stage III (moderate)    followed by Dr. Johnny Bridge  . Chronic tension headaches   . Coronary artery disease   . Coronary atherosclerosis of autologous vein bypass graft   . DDD (degenerative disc disease), lumbar   . Degenerative disc disease, lumbar    with lumbar radiculopathy  . Dyspnea   . Elbow fracture, left   . GERD (gastroesophageal reflux disease)   . H/O adenomatous polyp of colon   . H/O hemorrhoids   . H/O urticaria   . Headache   . Heart disease   . Hypercholesteremia   . Hyperlipidemia   . 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 Lucky Cowboy  . Lung cancer ([place])   . Lung cancer ([place])   . Meralgia paresthetica   . Meralgia paresthetica   . Neuralgia   . Osteoarthritis   . Osteoarthritis    s/p L knee surgery  . 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 Presence of permanent cardiac pacemaker   . Prostate cancer ([place])   . Second degree AV block    Followed by Dr. Nehemiah Massed  . Sinoatrial node dysfunction (HCC)   . Status post partial lobectomy of lung    bottom right   . Stroke ([place])   . TIA (transient ischemic attack)     Past Surgical History:  Procedure Laterality Date  . CATARACT EXTRACTION    . COLONOSCOPY    . COLONOSCOPY    . COLONOSCOPY WITH PROPOFOL N/A 10/20/2015   Procedure: COLONOSCOPY WITH PROPOFOL;  Surgeon: Manya Silvas, MD;  Location: [facility] ENDOSCOPY;  Service: Endoscopy;  Laterality: N/A;  . CORONARY ARTERY BYPASS GRAFT     triple  . coronary atherosclerosis of autologous vein bypass graft    . EMBOLIZATION Right 12/27/2016   Procedure: Embolization;  Surgeon: Algernon Huxley, MD;  Location: [place] CV [facility];  Service: Cardiovascular;  Laterality: Right;  . ENDOVASCULAR REPAIR/STENT GRAFT N/A 01/05/2017   Procedure: Endovascular Repair/Stent Graft;  Surgeon: Algernon Huxley, MD;  Location: [place] CV LAB;  Service: Cardiovascular;  Laterality: N/A;  . EYE SURGERY Bilateral    cataract extraction  . JOINT REPLACEMENT  shoulder and knees  . KNEE ARTHROSCOPY    . LOBECTOMY  01/31/13   RLL w/squamous cell carcinoma lobectomy  . LUNG REMOVAL, PARTIAL  2014   right lower lobe  . PACEMAKER INSERTION    . PACEMAKER INSERTION  12/2012   Dual chanber pacemaker generator  . partial shoulder replacement Right   . POLYPECTOMY    . PROSTATECTOMY    . TOTAL KNEE ARTHROPLASTY Bilateral   . TOTAL SHOULDER ARTHROPLASTY Left 07/10/2015   Procedure: TOTAL SHOULDER ARTHROPLASTY;  Surgeon: [name], MD;  Location: [facility] ORS;  Service: Orthopedics;  Laterality: Left;  . TOTAL SHOULDER REPLACEMENT      There were no vitals filed for this visit.      Subjective Assessment - 05/17/17 1517    Subjective Patient reports soreness in left calf following previous session and is still a little sore today. wife requests a  look at incision.    Pertinent History Patient has had back pain for years with multiple episodes of exacerbation of symptoms. He reports that most recent episode began summer 2017 following painting a 6' fence and the pain got progressively worse with weakness in LEs. He then has MRI and then surgery 03/16/2017. He has history of TIA with residueal short term memory diysfunction and bilateral shoulder surgeries, bilateral TKR and aortic anuerysm and pacemaker.    Limitations Sitting;Lifting;Walking;Standing;House hold activities   How long can you sit comfortably? >30 min.   How long can you stand comfortably? no longer than 10 min   How long can you walk comfortably? walking with rolling walker short distances   Patient Stated Goals improve strength, walk without AD, return to prior level of function   Currently in Pain? Yes   Pain Score 3    Pain Location Back   Pain Orientation Lower;Left   Pain Descriptors / Indicators Sore   Pain Type Acute pain;Surgical pain  03/16/2017   Pain Onset More than a month ago  03/16/2017   Pain Frequency Intermittent      Objective: Gait; ambulating independently with rollatorand back brace support in place, more erect posture and improved cadence as compared to previous session Observation: lower back incision well healed  Treatment: Therapeutic exercise: patient performed with VC, tactile cues and demonstration of therapist: back brace in place throughout session: Sitting: in chair, with back supported andback brace in place for all exercises Rocker board forward and back and side to side   Knee extension through partial ROM with 2# weight on ankles2x 15  greenresistive band knee flexion 2 x 20reps each LE Hip abduction with green + redresistive band x 10reps both  Resistive DF alternating each foot with green resistive band 3x 10reps with moderate fatigue noted left foot   UE exercises seated in chair with resistive band: with assistance  of therapist palloff press forward ( green +red band)x 20  Bilateral scapular retraction x 20 green + red band Single arm row x 10 reps each UE doubled green + red band Straight arm pull downs with green band x 20 Standing at counter for support: Walk side stepping x 1 1/2 min. with VC for hip, knee alignment and correct technique, on airex balance beam, contact guard for safety and using UE support on counter for balance, safety Step ups forward onto airex balance beam leading 15x with each LE with VC and contact guard for safety, UE's on counter for support/balance Instructed in use of SPC for balance during ambulation: patient performed  with close supervision x 1 20' with VC for proper sequence and turning with cane  Patient response to treatment: patient demonstrated improved technique with exercises with minimal VC and repeated demonstration for correct alignment. Improved motor control with repetition and cuing. Improved gait pattern with SPC following instruction and demonstration. Moderate fatigue reported with all exercises.      PT Education - 05/17/17 1521    Education provided Yes   Education Details instruct in proper technique and alignment with exercises   Methods Explanation;Demonstration;Verbal cues   Comprehension Verbalized understanding;Returned demonstration;Verbal cues required             PT Long Term Goals - 04/12/17 1132      PT LONG TERM GOAL #1   Title  Patient will demonstrate improved function with daily tasks and decreased back pain as indicated by MODI score of 40% or better by 05/09/2017    Baseline MODI 58%   Status New     PT LONG TERM GOAL #2   Title  Patient will demonstrate improved function with progressing towards prior level of funciton with daily tasks and decreased back pain as indicated by MODI score of 20% or better by 06/07/2017    Baseline MDI 58%   Status New     PT LONG TERM GOAL #3   Title Patient will demonstrate improved  posture awareness and pain control strategies to allow patient to sit/stand for >30 min. by 05/22/2017   Baseline unable to sit or stand >10 - 30 min. without increased pain   Status New     PT LONG TERM GOAL #4   Title Patient will be independent with home program for posture awareness, pain control, progressive exercises to allow patient to transition to self management once discharged from physical therapy 06/07/2017   Baseline limited knowledge of exercises and progrression without guidance and instruction   Status New               Plan - 05/17/17 1600    Clinical Impression Statement Patient demonstrates improvement with ambulation and is now able to use cane for balance for short distances in clinic. He is improving strength and endurance as demonstrates during exercises. Patient demonstrates steady progress towards goals with improvement noted in ROM, strength, endurance. Patient will benefit from continued physical therapy intervention to address limitations and achieve goals.    Rehab Potential Good   Clinical Impairments Affecting Rehab Potential (+) acute condition s/p surgery, motivated, family support (-)co morbidities; shoulder surgeries, TKR bilateral, aortic anuerysm, pacemaker, CA   PT Frequency 2x / week   PT Duration 8 weeks   PT Treatment/Interventions Therapeutic exercise;Moist Heat;Patient/family education;Manual techniques;Gait training   PT Next Visit Plan progressive exercise, manual STM as needed   PT Home Exercise Plan ROM LE's seated ball rolling under foot, hip adduction, glute sets, core stabilization with resistive bands for scapular rows, palloff press      Patient will benefit from skilled therapeutic intervention in order to improve the following deficits and impairments:  Decreased strength, Decreased activity tolerance, Impaired perceived functional ability, Pain, Increased muscle spasms, Decreased endurance, Decreased range of motion, Difficulty  walking  Visit Diagnosis: Muscle weakness (generalized)  Difficulty in walking, not elsewhere classified  Acute low back pain without sciatica, unspecified back pain laterality     Problem List Patient Active Problem List   Diagnosis Date Noted  . Spondylolisthesis of lumbosacral region 03/16/2017  . Low back pain 02/01/2017  . Escherichia coli (E.  coli) infection 01/28/2017  . Acute on chronic renal failure ([place]) 01/28/2017  . Elevated troponin 01/28/2017  . Generalized weakness 01/28/2017  . Essential hypertension 01/28/2017  . UTI (urinary tract infection) 01/26/2017  . AAA (abdominal aortic aneurysm) without rupture ([place]) 11/23/2016  . Chronic obstructive pulmonary disease ([place]) 10/09/2015  . Personal history of diseases of skin or subcutaneous tissue 10/09/2015  . Malignant neoplasm of prostate ([place]) 10/09/2015  . Pure hypercholesterolemia 10/09/2015  . Sinoatrial node dysfunction ([place]) 10/09/2015  . History of surgical procedure 08/22/2015  . Status post total shoulder replacement 07/10/2015  . Arthritis of shoulder region, degenerative 06/10/2015  . Benign essential HTN 06/03/2015  . Absolute anemia 04/12/2015  . Atrophic kidney 04/12/2015  . Essential (primary) hypertension 04/12/2015  . Acid reflux 04/12/2015  . Cancer of lung ([place]) 04/12/2015  . CA of prostate ([place]) 04/12/2015  . H/O adenomatous polyp of colon 01/15/2015  . Temporary cerebral vascular dysfunction 11/21/2014  . Calcific shoulder tendinitis 08/01/2014  . Cervical nerve root disorder 04/07/2012    [name] PT 05/18/2017, 7:58 AM  [facility] [address] [place], [address] Phone: [phone]   Fax:  [phone]  Name: Rick Mcbride. MRN: [id number] Date of Birth: 02-Jun-1941

## 2017-05-19 ENCOUNTER — Encounter: Payer: Self-pay | Admitting: Physical Therapy

## 2017-05-19 ENCOUNTER — Ambulatory Visit: Payer: PPO | Admitting: Physical Therapy

## 2017-05-19 DIAGNOSIS — M6281 Muscle weakness (generalized): Secondary | ICD-10-CM

## 2017-05-19 DIAGNOSIS — M545 Low back pain, unspecified: Secondary | ICD-10-CM

## 2017-05-19 DIAGNOSIS — R262 Difficulty in walking, not elsewhere classified: Secondary | ICD-10-CM

## 2017-05-19 NOTE — Therapy (Signed)
[facility] [address] [place], [address] Phone: [phone]   Fax:  [phone]  Physical Therapy Treatment  Patient Details  Name: Rick Mcbride. MRN: [id number] Date of Birth: [birth year] Referring Provider: Ophelia Charter MD  Encounter Date: 05/19/2017      PT End of Session - 05/19/17 1548    Visit Number 10   Number of Visits 16   Date for PT Re-Evaluation 06/07/17   Authorization Type 10   Authorization Time Period 10 (G code)   PT Start Time 1515   PT Stop Time 1548   PT Time Calculation (min) 33 min   Activity Tolerance Patient tolerated treatment well   Behavior During Therapy [facility] for tasks assessed/performed      Past Medical History:  Diagnosis Date  . AAA (abdominal aortic aneurysm) ([place])   . AAA (abdominal aortic aneurysm) without rupture ([place])   . Anemia   . Aneurysm ([place])    abd aortic  . Anxiety   . Atrophic kidney   . Cervical radiculopathy   . Chronic airway obstruction (HCC)    not aware of this  . Chronic kidney disease (CKD), stage III (moderate)    followed by Dr. Johnny Bridge  . Chronic tension headaches   . Coronary artery disease   . Coronary atherosclerosis of autologous vein bypass graft   . DDD (degenerative disc disease), lumbar   . Degenerative disc disease, lumbar    with lumbar radiculopathy  . Dyspnea   . Elbow fracture, left   . GERD (gastroesophageal reflux disease)   . H/O adenomatous polyp of colon   . H/O hemorrhoids   . H/O urticaria   . Headache   . Heart disease   . Hypercholesteremia   . Hyperlipidemia   . 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 Lucky Cowboy  . Lung cancer ([place])   . Lung cancer ([place])   . Meralgia paresthetica   . Meralgia paresthetica   . Neuralgia   . Osteoarthritis   . Osteoarthritis    s/p L knee surgery  . 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 Presence of permanent cardiac pacemaker   . Prostate cancer ([place])   . Second degree AV block    Followed by Dr. Nehemiah Massed  . Sinoatrial node dysfunction (HCC)   . Status post partial lobectomy of lung    bottom right   . Stroke ([place])   . TIA (transient ischemic attack)     Past Surgical History:  Procedure Laterality Date  . CATARACT EXTRACTION    . COLONOSCOPY    . COLONOSCOPY    . COLONOSCOPY WITH PROPOFOL N/A 10/20/2015   Procedure: COLONOSCOPY WITH PROPOFOL;  Surgeon: Manya Silvas, MD;  Location: [facility] ENDOSCOPY;  Service: Endoscopy;  Laterality: N/A;  . CORONARY ARTERY BYPASS GRAFT     triple  . coronary atherosclerosis of autologous vein bypass graft    . EMBOLIZATION Right 12/27/2016   Procedure: Embolization;  Surgeon: Algernon Huxley, MD;  Location: [facility] [facility] LAB;  Service: Cardiovascular;  Laterality: Right;  . ENDOVASCULAR REPAIR/STENT GRAFT N/A 01/05/2017   Procedure: Endovascular Repair/Stent Graft;  Surgeon: Algernon Huxley, MD;  Location: [facility] CV LAB;  Service: Cardiovascular;  Laterality: N/A;  . EYE SURGERY Bilateral    cataract extraction  . JOINT REPLACEMENT  shoulder and knees  . KNEE ARTHROSCOPY    . LOBECTOMY  01/31/13   RLL w/squamous cell carcinoma lobectomy  . LUNG REMOVAL, PARTIAL  2014   right lower lobe  . PACEMAKER INSERTION    . PACEMAKER INSERTION  12/2012   Dual chanber pacemaker generator  . partial shoulder replacement Right   . POLYPECTOMY    . PROSTATECTOMY    . TOTAL KNEE ARTHROPLASTY Bilateral   . TOTAL SHOULDER ARTHROPLASTY Left 07/10/2015   Procedure: TOTAL SHOULDER ARTHROPLASTY;  Surgeon: [name], MD;  Location: [facility] ORS;  Service: Orthopedics;  Laterality: Left;  . TOTAL SHOULDER REPLACEMENT      There were no vitals filed for this visit.      Subjective Assessment - 05/19/17 1518    Subjective Aching in lower back today. He reports he is using cane at home now.    Pertinent History Patient has had back pain  for years with multiple episodes of exacerbation of symptoms. He reports that most recent episode began summer 2017 following painting a 6' fence and the pain got progressively worse with weakness in LEs. He then has MRI and then surgery 03/16/2017. He has history of TIA with residueal short term memory diysfunction and bilateral shoulder surgeries, bilateral TKR and aortic anuerysm and pacemaker.    Limitations Sitting;Lifting;Walking;Standing;House hold activities   How long can you sit comfortably? >30 min.   How long can you stand comfortably? no longer than 10 min   How long can you walk comfortably? walking with rolling walker short distances   Patient Stated Goals improve strength, walk without AD, return to prior level of function   Currently in Pain? Yes   Pain Score 3    Pain Location Back   Pain Orientation Lower   Pain Descriptors / Indicators Aching   Pain Type Acute pain;Surgical pain  03/16/2017   Pain Onset More than a month ago  03/16/2017       Objective: Gait; ambulating independently with rollatorand back brace support in place, more erect posture and improved cadence as compared to previous session Observation: lower back incision well healed  Treatment: Therapeutic exercise: patient performed with VC, tactile cues and demonstration of therapist: Sitting: in chair, with back supported and moist heat applied to lower back during all sitting exercises: 15 min. (unbillled) no adverse reaction noted; goal: pain control Rocker board forward and back and side to side  Knee extension through partial ROM with 3# weight on ankles2x 15  greenresistive band knee flexion 2 x 20reps each LE Hip abduction with green + redresistive band x 10reps both  Resistive DF alternating each foot with greenresistive band 3x 10reps with moderate fatigue noted left foot   UE exercises seated in chair with resistive band: with assistance of therapist palloff press forward ( green  +red band)x 20  Bilateral scapular retraction x 20 green + red band Single arm row x 10 reps each UE doubled green + red band Straight arm pull downs with green band x 20  Standing at counter for support: close supervision and contact guard required for safety with all exercises Walk side stepping x 1 min. with VC for hip, knee alignment and correct technique, on airex balance beam, contact guard for safety and using UE support on counter for balance, safety Step ups forward onto airex balance padleading 15x with each LE with VC and contact guard for safety, UE's on counter for support/balance  Patient response to treatment: Patient demonstrated  improved technique with exercises with minimal VC and repetition. Moderate fatigue noted with all exercises.          PT Education - 2017-06-01 1600    Education provided Yes   Education Details instruction in proper technqiue and alignemnt with all exercises   Person(s) Educated Patient   Methods Explanation;Demonstration;Verbal cues   Comprehension Verbalized understanding;Returned demonstration;Verbal cues required             PT Long Term Goals - 04/12/17 1132      PT LONG TERM GOAL #1   Title  Patient will demonstrate improved function with daily tasks and decreased back pain as indicated by MODI score of 40% or better by 05/09/2017    Baseline MODI 58%   Status New     PT LONG TERM GOAL #2   Title  Patient will demonstrate improved function with progressing towards prior level of funciton with daily tasks and decreased back pain as indicated by MODI score of 20% or better by 06/07/2017    Baseline MDI 58%   Status New     PT LONG TERM GOAL #3   Title Patient will demonstrate improved posture awareness and pain control strategies to allow patient to sit/stand for >30 min. by 05/22/2017   Baseline unable to sit or stand >10 - 30 min. without increased pain   Status New     PT LONG TERM GOAL #4   Title Patient will be  independent with home program for posture awareness, pain control, progressive exercises to allow patient to transition to self management once discharged from physical therapy 06/07/2017   Baseline limited knowledge of exercises and progrression without guidance and instruction   Status New               Plan - June 01, 2017 1600    Clinical Impression Statement Patient demonstrates improvement with ambulation and demonstrated moderate fatigue with exercises. Patient demonstrates steady progress towards goals with improvement noted in strength, endurance. Patient will benefit from continued physical therapy intervention to address limitations and achieve goals.    Rehab Potential Good   Clinical Impairments Affecting Rehab Potential (+) acute condition s/p surgery, motivated, family support (-)co morbidities; shoulder surgeries, TKR bilateral, aortic anuerysm, pacemaker, CA   PT Frequency 2x / week   PT Duration 8 weeks   PT Treatment/Interventions Therapeutic exercise;Moist Heat;Patient/family education;Manual techniques;Gait training   PT Next Visit Plan progressive exercise, manual STM as needed   PT Home Exercise Plan ROM LE's seated ball rolling under foot, hip adduction, glute sets, core stabilization with resistive bands for scapular rows, palloff press      Patient will benefit from skilled therapeutic intervention in order to improve the following deficits and impairments:  Decreased strength, Decreased activity tolerance, Impaired perceived functional ability, Pain, Increased muscle spasms, Decreased endurance, Decreased range of motion, Difficulty walking  Visit Diagnosis: Muscle weakness (generalized)  Difficulty in walking, not elsewhere classified  Acute low back pain without sciatica, unspecified back pain laterality       G-Codes - 06/01/2017 1600    Functional Assessment Tool Used (Outpatient Only) MODI, pain, strenth, ROM, clinical judgment   Functional Limitation  Mobility: Walking and moving around   Mobility: Walking and Moving Around Current Status (F7902) At least 40 percent but less than 60 percent impaired, limited or restricted   Mobility: Walking and Moving Around Goal Status (I0973) At least 1 percent but less than 20 percent impaired, limited or restricted  Problem List Patient Active Problem List   Diagnosis Date Noted  . Spondylolisthesis of lumbosacral region 03/16/2017  . Low back pain 02/01/2017  . Escherichia coli (E. coli) infection 01/28/2017  . Acute on chronic renal failure ([place]) 01/28/2017  . Elevated troponin 01/28/2017  . Generalized weakness 01/28/2017  . Essential hypertension 01/28/2017  . UTI (urinary tract infection) 01/26/2017  . AAA (abdominal aortic aneurysm) without rupture ([place]) 11/23/2016  . Chronic obstructive pulmonary disease ([place]) 10/09/2015  . Personal history of diseases of skin or subcutaneous tissue 10/09/2015  . Malignant neoplasm of prostate ([place]) 10/09/2015  . Pure hypercholesterolemia 10/09/2015  . Sinoatrial node dysfunction ([place]) 10/09/2015  . History of surgical procedure 08/22/2015  . Status post total shoulder replacement 07/10/2015  . Arthritis of shoulder region, degenerative 06/10/2015  . Benign essential HTN 06/03/2015  . Absolute anemia 04/12/2015  . Atrophic kidney 04/12/2015  . Essential (primary) hypertension 04/12/2015  . Acid reflux 04/12/2015  . Cancer of lung ([place]) 04/12/2015  . CA of prostate ([place]) 04/12/2015  . H/O adenomatous polyp of colon 01/15/2015  . Temporary cerebral vascular dysfunction 11/21/2014  . Calcific shoulder tendinitis 08/01/2014  . Cervical nerve root disorder 04/07/2012    [name] PT 05/20/2017, 10:51 AM  [facility] [address], [place], [address] Phone: [phone]   Fax:  [phone]  Name: Rick Mcbride. MRN: [id number] Date of Birth: 07-27-1941

## 2017-05-24 ENCOUNTER — Ambulatory Visit: Payer: PPO | Admitting: Physical Therapy

## 2017-05-24 DIAGNOSIS — S52022A Displaced fracture of olecranon process without intraarticular extension of left ulna, initial encounter for closed fracture: Secondary | ICD-10-CM | POA: Diagnosis not present

## 2017-05-24 DIAGNOSIS — S52122A Displaced fracture of head of left radius, initial encounter for closed fracture: Secondary | ICD-10-CM | POA: Diagnosis not present

## 2017-05-24 DIAGNOSIS — I2510 Atherosclerotic heart disease of native coronary artery without angina pectoris: Secondary | ICD-10-CM | POA: Diagnosis not present

## 2017-05-24 DIAGNOSIS — I10 Essential (primary) hypertension: Secondary | ICD-10-CM | POA: Diagnosis not present

## 2017-05-24 DIAGNOSIS — Z9660 Presence of unspecified orthopedic joint implant: Secondary | ICD-10-CM | POA: Diagnosis not present

## 2017-05-24 DIAGNOSIS — Z8673 Personal history of transient ischemic attack (TIA), and cerebral infarction without residual deficits: Secondary | ICD-10-CM | POA: Diagnosis not present

## 2017-05-24 DIAGNOSIS — E782 Mixed hyperlipidemia: Secondary | ICD-10-CM | POA: Diagnosis not present

## 2017-05-24 DIAGNOSIS — Z79899 Other long term (current) drug therapy: Secondary | ICD-10-CM | POA: Diagnosis not present

## 2017-05-24 DIAGNOSIS — N183 Chronic kidney disease, stage 3 (moderate): Secondary | ICD-10-CM | POA: Diagnosis not present

## 2017-05-24 DIAGNOSIS — D649 Anemia, unspecified: Secondary | ICD-10-CM | POA: Diagnosis not present

## 2017-05-24 DIAGNOSIS — W19XXXA Unspecified fall, initial encounter: Secondary | ICD-10-CM | POA: Diagnosis not present

## 2017-05-24 DIAGNOSIS — S42402A Unspecified fracture of lower end of left humerus, initial encounter for closed fracture: Secondary | ICD-10-CM | POA: Diagnosis not present

## 2017-05-24 DIAGNOSIS — R739 Hyperglycemia, unspecified: Secondary | ICD-10-CM | POA: Diagnosis not present

## 2017-05-26 ENCOUNTER — Encounter: Payer: Self-pay | Admitting: Physical Therapy

## 2017-05-26 ENCOUNTER — Ambulatory Visit: Payer: PPO | Admitting: Physical Therapy

## 2017-05-26 DIAGNOSIS — M545 Low back pain, unspecified: Secondary | ICD-10-CM

## 2017-05-26 DIAGNOSIS — R262 Difficulty in walking, not elsewhere classified: Secondary | ICD-10-CM

## 2017-05-26 DIAGNOSIS — M6281 Muscle weakness (generalized): Secondary | ICD-10-CM

## 2017-05-26 NOTE — Therapy (Signed)
[facility] [address], [place], [address] Phone: [phone]   Fax:  [phone]  Physical Therapy Treatment  Patient Details  Name: Rick Mcbride. MRN: [id number] Date of Birth: 1940/12/04 Referring Provider: Ophelia Charter MD  Encounter Date: 05/26/2017      PT End of Session - 05/26/17 1624    Visit Number 11   Number of Visits 16   Date for PT Re-Evaluation 06/07/17   Authorization Type 11   Authorization Time Period 20 (G code)   PT Start Time 1512   PT Stop Time 1605   PT Time Calculation (min) 53 min   Activity Tolerance Patient tolerated treatment well   Behavior During Therapy [facility] for tasks assessed/performed      Past Medical History:  Diagnosis Date  . AAA (abdominal aortic aneurysm) ([place])   . AAA (abdominal aortic aneurysm) without rupture ([place])   . Anemia   . Aneurysm ([place])    abd aortic  . Anxiety   . Atrophic kidney   . Cervical radiculopathy   . Chronic airway obstruction (HCC)    not aware of this  . Chronic kidney disease (CKD), stage III (moderate)    followed by Dr. Johnny Bridge  . Chronic tension headaches   . Coronary artery disease   . Coronary atherosclerosis of autologous vein bypass graft   . DDD (degenerative disc disease), lumbar   . Degenerative disc disease, lumbar    with lumbar radiculopathy  . Dyspnea   . Elbow fracture, left   . GERD (gastroesophageal reflux disease)   . H/O adenomatous polyp of colon   . H/O hemorrhoids   . H/O urticaria   . Headache   . Heart disease   . Hypercholesteremia   . Hyperlipidemia   . 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 Lucky Cowboy  . Lung cancer ([place])   . Lung cancer ([place])   . Meralgia paresthetica   . Meralgia paresthetica   . Neuralgia   . Osteoarthritis   . Osteoarthritis    s/p L knee surgery  . 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 Presence of permanent cardiac pacemaker   . Prostate cancer ([place])   . Second degree AV block    Followed by Dr. Nehemiah Massed  . Sinoatrial node dysfunction (HCC)   . Status post partial lobectomy of lung    bottom right   . Stroke ([place])   . TIA (transient ischemic attack)     Past Surgical History:  Procedure Laterality Date  . CATARACT EXTRACTION    . COLONOSCOPY    . COLONOSCOPY    . COLONOSCOPY WITH PROPOFOL N/A 10/20/2015   Procedure: COLONOSCOPY WITH PROPOFOL;  Surgeon: Manya Silvas, MD;  Location: [facility] ENDOSCOPY;  Service: Endoscopy;  Laterality: N/A;  . CORONARY ARTERY BYPASS GRAFT     triple  . coronary atherosclerosis of autologous vein bypass graft    . EMBOLIZATION Right 12/27/2016   Procedure: Embolization;  Surgeon: Algernon Huxley, MD;  Location: [place] CV LAB;  Service: Cardiovascular;  Laterality: Right;  . ENDOVASCULAR REPAIR/STENT GRAFT N/A 01/05/2017   Procedure: Endovascular Repair/Stent Graft;  Surgeon: Algernon Huxley, MD;  Location: [place] CV LAB;  Service: Cardiovascular;  Laterality: N/A;  . EYE SURGERY Bilateral    cataract extraction  . JOINT REPLACEMENT  shoulder and knees  . KNEE ARTHROSCOPY    . LOBECTOMY  01/31/13   RLL w/squamous cell carcinoma lobectomy  . LUNG REMOVAL, PARTIAL  2014   right lower lobe  . PACEMAKER INSERTION    . PACEMAKER INSERTION  12/2012   Dual chanber pacemaker generator  . partial shoulder replacement Right   . POLYPECTOMY    . PROSTATECTOMY    . TOTAL KNEE ARTHROPLASTY Bilateral   . TOTAL SHOULDER ARTHROPLASTY Left 07/10/2015   Procedure: TOTAL SHOULDER ARTHROPLASTY;  Surgeon: [name], MD;  Location: [facility] ORS;  Service: Orthopedics;  Laterality: Left;  . TOTAL SHOULDER REPLACEMENT      There were no vitals filed for this visit.      Subjective Assessment - 05/26/17 1515    Subjective Patient and wife report that on 05/24/2017 patient had a near fall while  patient was seated in seat of rolling  walker (wilfe was pushing him), into hospital down a ramp when going to visit a sick friend at the hospital [facility]. He fell back and hit his left arm/elbow and right hand abrasion. He was seen in the ER and released with soft cast placed on left UE.    Pertinent History Patient has had back pain for years with multiple episodes of exacerbation of symptoms. He reports that most recent episode began summer 2017 following painting a 6' fence and the pain got progressively worse with weakness in LEs. He then has MRI and then surgery 03/16/2017. He has history of TIA with residueal short term memory diysfunction and bilateral shoulder surgeries, bilateral TKR and aortic anuerysm and pacemaker.    Limitations Sitting;Lifting;Walking;Standing;House hold activities   How long can you sit comfortably? >30 min.   How long can you stand comfortably? no longer than 10 min   How long can you walk comfortably? walking with rolling walker short distances   Diagnostic tests X rays of left UE see report   Patient Stated Goals improve strength, walk without AD, return to prior level of function   Currently in Pain? No/denies   Pain Onset --  03/16/2017     Objective:  Patient arrived in clinic with sling and swath left UE with soft cast brace on left UE, ambulating with rolling walker   Treatment: Therapeutic exercise: patient performed with VC, tactile cues and demonstration of therapist: Sitting: in chair, with back supported and moist heat applied to lower back during all sitting exercises: 15 min. (unbillled) no adverse reaction noted; goal: pain control Rocker board forward and back and side to side  Knee extension through partial ROM with 3# weight on ankles2x 15 greenresistive band knee flexion 2 x 20reps each LE Hip abduction with green + redresistive band x 10reps both  Resistive DF alternating each foot with green and redresistive bands 3x 10reps with moderatefatigue noted left  foot  Patient response to treatment: patient demonstrated improved technique with exercises with minimal VC for correct alignment.          PT Education - 05/26/17 1623    Education provided Yes   Education Details re positioned sling and swath for patient   Person(s) Educated Patient   Methods Explanation   Comprehension Verbalized understanding             PT Long Term Goals - 04/12/17 1132      PT LONG TERM GOAL #1   Title  Patient will demonstrate improved function with daily tasks and decreased back pain as  indicated by MODI score of 40% or better by 05/09/2017    Baseline MODI 58%   Status New     PT LONG TERM GOAL #2   Title  Patient will demonstrate improved function with progressing towards prior level of funciton with daily tasks and decreased back pain as indicated by MODI score of 20% or better by 06/07/2017    Baseline MDI 58%   Status New     PT LONG TERM GOAL #3   Title Patient will demonstrate improved posture awareness and pain control strategies to allow patient to sit/stand for >30 min. by 05/22/2017   Baseline unable to sit or stand >10 - 30 min. without increased pain   Status New     PT LONG TERM GOAL #4   Title Patient will be independent with home program for posture awareness, pain control, progressive exercises to allow patient to transition to self management once discharged from physical therapy 06/07/2017   Baseline limited knowledge of exercises and progrression without guidance and instruction   Status New               Plan - 05/26/17 1624    Clinical Impression Statement Session was taken up largely with history of recent fall 05/26/2017 and then with limited exercises focused on LE strengthening in sitting due to sling and swath on left UE that limited balance and standing exercises.    Rehab Potential Good   Clinical Impairments Affecting Rehab Potential (+) acute condition s/p surgery, motivated, family support (-)co morbidities;  shoulder surgeries, TKR bilateral, aortic anuerysm, pacemaker, CA   PT Frequency 2x / week   PT Duration 8 weeks   PT Treatment/Interventions Therapeutic exercise;Moist Heat;Patient/family education;Manual techniques;Gait training   PT Next Visit Plan progressive exercise, manual STM as needed   PT Home Exercise Plan ROM LE's seated ball rolling under foot, hip adduction, glute sets, core stabilization with resistive bands for scapular rows, palloff press      Patient will benefit from skilled therapeutic intervention in order to improve the following deficits and impairments:  Decreased strength, Decreased activity tolerance, Impaired perceived functional ability, Pain, Increased muscle spasms, Decreased endurance, Decreased range of motion, Difficulty walking  Visit Diagnosis: Muscle weakness (generalized)  Difficulty in walking, not elsewhere classified  Acute low back pain without sciatica, unspecified back pain laterality     Problem List Patient Active Problem List   Diagnosis Date Noted  . Spondylolisthesis of lumbosacral region 03/16/2017  . Low back pain 02/01/2017  . Escherichia coli (E. coli) infection 01/28/2017  . Acute on chronic renal failure ([place]) 01/28/2017  . Elevated troponin 01/28/2017  . Generalized weakness 01/28/2017  . Essential hypertension 01/28/2017  . UTI (urinary tract infection) 01/26/2017  . AAA (abdominal aortic aneurysm) without rupture ([place]) 11/23/2016  . Chronic obstructive pulmonary disease ([place]) 10/09/2015  . Personal history of diseases of skin or subcutaneous tissue 10/09/2015  . Malignant neoplasm of prostate ([place]) 10/09/2015  . Pure hypercholesterolemia 10/09/2015  . Sinoatrial node dysfunction ([place]) 10/09/2015  . History of surgical procedure 08/22/2015  . Status post total shoulder replacement 07/10/2015  . Arthritis of shoulder region, degenerative 06/10/2015  . Benign essential HTN 06/03/2015  . Absolute anemia 04/12/2015  .  Atrophic kidney 04/12/2015  . Essential (primary) hypertension 04/12/2015  . Acid reflux 04/12/2015  . Cancer of lung ([place]) 04/12/2015  . CA of prostate ([place]) 04/12/2015  . H/O adenomatous polyp of colon 01/15/2015  . Temporary cerebral vascular dysfunction 11/21/2014  .  Calcific shoulder tendinitis 08/01/2014  . Cervical nerve root disorder 04/07/2012    [name] PT 05/27/2017, 7:24 AM  [facility] [address] [place], [address] Phone: [phone]   Fax:  [phone]  Name: Rick Mcbride. MRN: [id number] Date of Birth: 11/09/1940

## 2017-05-27 DIAGNOSIS — M25522 Pain in left elbow: Secondary | ICD-10-CM | POA: Diagnosis not present

## 2017-05-27 DIAGNOSIS — S52022A Displaced fracture of olecranon process without intraarticular extension of left ulna, initial encounter for closed fracture: Secondary | ICD-10-CM | POA: Diagnosis not present

## 2017-05-31 ENCOUNTER — Ambulatory Visit: Payer: PPO | Admitting: Physical Therapy

## 2017-05-31 ENCOUNTER — Encounter: Payer: Self-pay | Admitting: Physical Therapy

## 2017-05-31 DIAGNOSIS — M545 Low back pain, unspecified: Secondary | ICD-10-CM

## 2017-05-31 DIAGNOSIS — M6281 Muscle weakness (generalized): Secondary | ICD-10-CM

## 2017-05-31 DIAGNOSIS — R262 Difficulty in walking, not elsewhere classified: Secondary | ICD-10-CM

## 2017-05-31 NOTE — Therapy (Signed)
[facility] [address] [place], [address] Phone: [phone]   Fax:  [phone]  Physical Therapy Treatment  Patient Details  Name: Rick Mcbride. MRN: [id number] Date of Birth: 04-14-1941 Referring Provider: Ophelia Charter MD  Encounter Date: 05/31/2017      PT End of Session - 05/31/17 1531    Visit Number 12   Number of Visits 16   Date for PT Re-Evaluation 06/07/17   Authorization Type 12   Authorization Time Period 20 (G code)   PT Start Time 1517   PT Stop Time 1603   PT Time Calculation (min) 46 min   Activity Tolerance Patient tolerated treatment well   Behavior During Therapy [facility] for tasks assessed/performed      Past Medical History:  Diagnosis Date  . AAA (abdominal aortic aneurysm) ([place])   . AAA (abdominal aortic aneurysm) without rupture ([place])   . Anemia   . Aneurysm ([place])    abd aortic  . Anxiety   . Atrophic kidney   . Cervical radiculopathy   . Chronic airway obstruction (HCC)    not aware of this  . Chronic kidney disease (CKD), stage III (moderate)    followed by Dr. Johnny Bridge  . Chronic tension headaches   . Coronary artery disease   . Coronary atherosclerosis of autologous vein bypass graft   . DDD (degenerative disc disease), lumbar   . Degenerative disc disease, lumbar    with lumbar radiculopathy  . Dyspnea   . Elbow fracture, left   . GERD (gastroesophageal reflux disease)   . H/O adenomatous polyp of colon   . H/O hemorrhoids   . H/O urticaria   . Headache   . Heart disease   . Hypercholesteremia   . Hyperlipidemia   . 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 Lucky Cowboy  . Lung cancer ([place])   . Lung cancer ([place])   . Meralgia paresthetica   . Meralgia paresthetica   . Neuralgia   . Osteoarthritis   . Osteoarthritis    s/p L knee surgery  . 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 Presence of permanent cardiac pacemaker   . Prostate cancer ([place])   . Second degree AV block    Followed by Dr. Nehemiah Massed  . Sinoatrial node dysfunction (HCC)   . Status post partial lobectomy of lung    bottom right   . Stroke ([place])   . TIA (transient ischemic attack)     Past Surgical History:  Procedure Laterality Date  . CATARACT EXTRACTION    . COLONOSCOPY    . COLONOSCOPY    . COLONOSCOPY WITH PROPOFOL N/A 10/20/2015   Procedure: COLONOSCOPY WITH PROPOFOL;  Surgeon: Manya Silvas, MD;  Location: [facility] ENDOSCOPY;  Service: Endoscopy;  Laterality: N/A;  . CORONARY ARTERY BYPASS GRAFT     triple  . coronary atherosclerosis of autologous vein bypass graft    . EMBOLIZATION Right 12/27/2016   Procedure: Embolization;  Surgeon: Algernon Huxley, MD;  Location: [facility] CV LAB;  Service: Cardiovascular;  Laterality: Right;  . ENDOVASCULAR REPAIR/STENT GRAFT N/A 01/05/2017   Procedure: Endovascular Repair/Stent Graft;  Surgeon: Algernon Huxley, MD;  Location: [facility] LAB;  Service: Cardiovascular;  Laterality: N/A;  . EYE SURGERY Bilateral    cataract extraction  . JOINT REPLACEMENT  shoulder and knees  . KNEE ARTHROSCOPY    . LOBECTOMY  01/31/13   RLL w/squamous cell carcinoma lobectomy  . LUNG REMOVAL, PARTIAL  2014   right lower lobe  . PACEMAKER INSERTION    . PACEMAKER INSERTION  12/2012   Dual chanber pacemaker generator  . partial shoulder replacement Right   . POLYPECTOMY    . PROSTATECTOMY    . TOTAL KNEE ARTHROPLASTY Bilateral   . TOTAL SHOULDER ARTHROPLASTY Left 07/10/2015   Procedure: TOTAL SHOULDER ARTHROPLASTY;  Surgeon: [name], MD;  Location: [facility] ORS;  Service: Orthopedics;  Laterality: Left;  . TOTAL SHOULDER REPLACEMENT      There were no vitals filed for this visit.      Subjective Assessment - 05/31/17 1524    Subjective Patient reports he is being followed by [name] for fracture of left elbow and is seeing continued improvement in  left LE.    Pertinent History Patient has had back pain for years with multiple episodes of exacerbation of symptoms. He reports that most recent episode began summer 2017 following painting a 6' fence and the pain got progressively worse with weakness in LEs. He then has MRI and then surgery 03/16/2017. He has history of TIA with residueal short term memory diysfunction and bilateral shoulder surgeries, bilateral TKR and aortic anuerysm and pacemaker.    Limitations Sitting;Lifting;Walking;Standing;House hold activities   How long can you sit comfortably? >30 min.   How long can you stand comfortably? no longer than 10 min   How long can you walk comfortably? walking with rolling walker short distances   Diagnostic tests X rays of left UE see report   Patient Stated Goals improve strength, walk without AD, return to prior level of function   Currently in Pain? No/denies  soreness in lower back near incision   Pain Onset --  03/16/2017        Objective:  Patient arrived in clinic with sling and swath left UE with soft cast brace on left UE, ambulating with rolling walker   Treatment: Therapeutic exercise: patient performed with VC, tactile cues and demonstration of therapist: Sitting: in chair, with back supported   Knee extension ROM with 3# weight on ankles2x 15 green+ red resistive band knee flexion 2 x 25reps each LE Hip abduction with green + redresistive band x 10reps both  Hip flexion x 15 with 3# weight on thighs, 25x without weight Standing step ups onto 4" step leading 2 x 5 reps with each LE, close supervision, contact guard x 1 for safety  PF off of balance stones in sitting with 3# weights on thighs Resistive DF alternating each foot with green resistive band 2 x 15reps with moderatefatigue noted left foot  Patient response to treatment: Patient demonstrated improved technique with repetition and VC. Mild fatigue noted with all exercises. No increased pain reported  throughout exercises.            PT Education - 05/31/17 1529    Education provided Yes   Education Details instruction in exercises for LE's   Person(s) Educated Patient   Methods Explanation;Demonstration;Verbal cues   Comprehension Verbalized understanding;Returned demonstration;Verbal cues required             PT Long Term Goals - 04/12/17 1132      PT LONG TERM GOAL #1   Title  Patient will demonstrate improved function with daily tasks and decreased back pain as indicated by MODI score of 40% or better  by 05/09/2017    Baseline MODI 58%   Status New     PT LONG TERM GOAL #2   Title  Patient will demonstrate improved function with progressing towards prior level of funciton with daily tasks and decreased back pain as indicated by MODI score of 20% or better by 06/07/2017    Baseline MDI 58%   Status New     PT LONG TERM GOAL #3   Title Patient will demonstrate improved posture awareness and pain control strategies to allow patient to sit/stand for >30 min. by 05/22/2017   Baseline unable to sit or stand >10 - 30 min. without increased pain   Status New     PT LONG TERM GOAL #4   Title Patient will be independent with home program for posture awareness, pain control, progressive exercises to allow patient to transition to self management once discharged from physical therapy 06/07/2017   Baseline limited knowledge of exercises and progrression without guidance and instruction   Status New               Plan - 05/31/17 1533    Clinical Impression Statement Patient demonstrated improvement with technique and endurance with exercises. He continues with limitations with ambulation, strength and endurance s/p back surgery and will benefit from additional physical therapy intervention.    Rehab Potential Good   Clinical Impairments Affecting Rehab Potential (+) acute condition s/p surgery, motivated, family support (-)co morbidities; shoulder surgeries, TKR  bilateral, aortic anuerysm, pacemaker, CA   PT Frequency 2x / week   PT Duration 8 weeks   PT Treatment/Interventions Therapeutic exercise;Moist Heat;Patient/family education;Manual techniques;Gait training   PT Next Visit Plan progressive exercise, manual STM as needed   PT Home Exercise Plan ROM LE's seated ball rolling under foot, hip adduction, glute sets, core stabilization with resistive bands for scapular rows, palloff press      Patient will benefit from skilled therapeutic intervention in order to improve the following deficits and impairments:  Decreased strength, Decreased activity tolerance, Impaired perceived functional ability, Pain, Increased muscle spasms, Decreased endurance, Decreased range of motion, Difficulty walking  Visit Diagnosis: Muscle weakness (generalized)  Difficulty in walking, not elsewhere classified  Acute low back pain without sciatica, unspecified back pain laterality     Problem List Patient Active Problem List   Diagnosis Date Noted  . Spondylolisthesis of lumbosacral region 03/16/2017  . Low back pain 02/01/2017  . Escherichia coli (E. coli) infection 01/28/2017  . Acute on chronic renal failure ([place]) 01/28/2017  . Elevated troponin 01/28/2017  . Generalized weakness 01/28/2017  . Essential hypertension 01/28/2017  . UTI (urinary tract infection) 01/26/2017  . AAA (abdominal aortic aneurysm) without rupture ([place]) 11/23/2016  . Chronic obstructive pulmonary disease ([place]) 10/09/2015  . Personal history of diseases of skin or subcutaneous tissue 10/09/2015  . Malignant neoplasm of prostate ([place]) 10/09/2015  . Pure hypercholesterolemia 10/09/2015  . Sinoatrial node dysfunction ([place]) 10/09/2015  . History of surgical procedure 08/22/2015  . Status post total shoulder replacement 07/10/2015  . Arthritis of shoulder region, degenerative 06/10/2015  . Benign essential HTN 06/03/2015  . Absolute anemia 04/12/2015  . Atrophic kidney 04/12/2015   . Essential (primary) hypertension 04/12/2015  . Acid reflux 04/12/2015  . Cancer of lung ([place]) 04/12/2015  . CA of prostate ([place]) 04/12/2015  . H/O adenomatous polyp of colon 01/15/2015  . Temporary cerebral vascular dysfunction 11/21/2014  . Calcific shoulder tendinitis 08/01/2014  . Cervical nerve root disorder 04/07/2012  [name] PT 05/31/2017, 4:10 PM  [facility] [address], [place], [address] Phone: [phone]   Fax:  [phone]  Name: Rick Mcbride. MRN: [id number] Date of Birth: [birth year]

## 2017-06-02 ENCOUNTER — Ambulatory Visit: Payer: PPO | Admitting: Physical Therapy

## 2017-06-02 ENCOUNTER — Encounter: Payer: Self-pay | Admitting: Physical Therapy

## 2017-06-02 DIAGNOSIS — R262 Difficulty in walking, not elsewhere classified: Secondary | ICD-10-CM

## 2017-06-02 DIAGNOSIS — M545 Low back pain, unspecified: Secondary | ICD-10-CM

## 2017-06-02 DIAGNOSIS — M6281 Muscle weakness (generalized): Secondary | ICD-10-CM

## 2017-06-02 NOTE — Therapy (Signed)
[facility] [address], [place], [address] Phone: [phone]   Fax:  [phone]  Physical Therapy Treatment  Patient Details  Name: Rick Mcbride. MRN: [id number] Date of Birth: 04/06/1941 Referring Provider: Ophelia Charter MD  Encounter Date: 06/02/2017      PT End of Session - 06/02/17 1000    Visit Number 13   Number of Visits 16   Date for PT Re-Evaluation 06/07/17   Authorization Type 13   Authorization Time Period 20 (G code)   PT Start Time 0903   PT Stop Time 0945   PT Time Calculation (min) 42 min   Activity Tolerance Patient tolerated treatment well   Behavior During Therapy [facility] for tasks assessed/performed      Past Medical History:  Diagnosis Date  . AAA (abdominal aortic aneurysm) ([place])   . AAA (abdominal aortic aneurysm) without rupture ([place])   . Anemia   . Aneurysm ([place])    abd aortic  . Anxiety   . Atrophic kidney   . Cervical radiculopathy   . Chronic airway obstruction (HCC)    not aware of this  . Chronic kidney disease (CKD), stage III (moderate)    followed by Dr. Johnny Bridge  . Chronic tension headaches   . Coronary artery disease   . Coronary atherosclerosis of autologous vein bypass graft   . DDD (degenerative disc disease), lumbar   . Degenerative disc disease, lumbar    with lumbar radiculopathy  . Dyspnea   . Elbow fracture, left   . GERD (gastroesophageal reflux disease)   . H/O adenomatous polyp of colon   . H/O hemorrhoids   . H/O urticaria   . Headache   . Heart disease   . Hypercholesteremia   . Hyperlipidemia   . 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 Lucky Cowboy  . Lung cancer ([place])   . Lung cancer ([place])   . Meralgia paresthetica   . Meralgia paresthetica   . Neuralgia   . Osteoarthritis   . Osteoarthritis    s/p L knee surgery  . 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 Presence of permanent cardiac pacemaker   . Prostate cancer ([place])   . Second degree AV block    Followed by Dr. Nehemiah Massed  . Sinoatrial node dysfunction (HCC)   . Status post partial lobectomy of lung    bottom right   . Stroke ([place])   . TIA (transient ischemic attack)     Past Surgical History:  Procedure Laterality Date  . CATARACT EXTRACTION    . COLONOSCOPY    . COLONOSCOPY    . COLONOSCOPY WITH PROPOFOL N/A 10/20/2015   Procedure: COLONOSCOPY WITH PROPOFOL;  Surgeon: Manya Silvas, MD;  Location[facility] ENDOSCOPY;  Service: Endoscopy;  Laterality: N/A;  . CORONARY ARTERY BYPASS GRAFT     triple  . coronary atherosclerosis of autologous vein bypass graft    . EMBOLIZATION Right 12/27/2016   Procedure: Embolization;  Surgeon: Algernon Huxley, MD;  Location: [facility];  Service: Cardiovascular;  Laterality: Right;  . ENDOVASCULAR REPAIR/STENT GRAFT N/A 01/05/2017   Procedure: Endovascular Repair/Stent Graft;  Surgeon: Algernon Huxley, MD;  Location: [facility];  Service: Cardiovascular;  Laterality: N/A;  . EYE SURGERY Bilateral    cataract extraction  . JOINT REPLACEMENT  shoulder and knees  . KNEE ARTHROSCOPY    . LOBECTOMY  01/31/13   RLL w/squamous cell carcinoma lobectomy  . LUNG REMOVAL, PARTIAL  2014   right lower lobe  . PACEMAKER INSERTION    . PACEMAKER INSERTION  12/2012   Dual chanber pacemaker generator  . partial shoulder replacement Right   . POLYPECTOMY    . PROSTATECTOMY    . TOTAL KNEE ARTHROPLASTY Bilateral   . TOTAL SHOULDER ARTHROPLASTY Left 07/10/2015   Procedure: TOTAL SHOULDER ARTHROPLASTY;  Surgeon: [name], MD;  Location: [facility] ORS;  Service: Orthopedics;  Laterality: Left;  . TOTAL SHOULDER REPLACEMENT      There were no vitals filed for this visit.      Subjective Assessment - 06/02/17 0909    Subjective Patient reports he is doing better with legs feeling stronger and not having as much swelling. He is also keeping  arm supported on pillows as instructed to control swelling in left hand. He continues with soreness in lower back, unchavged from  s/p surgery.   Pertinent History Patient has had back pain for years with multiple episodes of exacerbation of symptoms. He reports that most recent episode began summer 2017 following painting a 6' fence and the pain got progressively worse with weakness in LEs. He then has MRI and then surgery 03/16/2017. He has history of TIA with residueal short term memory diysfunction and bilateral shoulder surgeries, bilateral TKR and aortic anuerysm and pacemaker.    Limitations Sitting;Lifting;Walking;Standing;House hold activities   How long can you sit comfortably? >30 min.   How long can you stand comfortably? no longer than 10 min   How long can you walk comfortably? walking with rolling walker short distances   Diagnostic tests X rays of left UE see report   Patient Stated Goals improve strength, walk without AD, return to prior level of function   Currently in Pain? No/denies   Pain Onset --  03/16/2017        Objective: Patient arrived in clinic with sling left UE with soft cast with ace wrap in place on left UE, ambulating with SPC with good balance and gait pattern  Treatment: Therapeutic exercise: patient performed with VC, tactile cues and demonstration of therapist: Sitting: in chair, with back supported   Single arm row right with doubled red resistive band x 15 reps Single arm pull down right  with green resistive band x 20 reps Knee extension ROM with 3# weight on ankles2x 15 green+ red resistive band knee flexion 2 x 25reps each LE Hip abduction with green + redresistive band x 10reps both and single leg x 10 reps each Hip flexion x 15 with 3# weight on thighs Standing step ups onto 4" step leading 2 x 5 reps with each LE, close supervision, contact guard x 1 for safety, using right UE for support/balance  PF off of balance stones in sitting with  3# weights on thighs Resistive DF alternating each foot with green resistive band2 x 15reps with moderatefatigue noted left foot  Patient response to treatment: Patient demonstrated improved technique with repetition and VC. Mild fatigue noted with all exercises. No increased pain reported throughout session. Minimal rest periods required between exercises. SPO2/HR at end of session 98%/ 75 bpm         PT Education - 06/02/17 1000    Education provided Yes   Education Details reassessed home program to continue at home; without resistance in sitting   Person(s) Educated  Patient   Methods Explanation   Comprehension Verbalized understanding             PT Long Term Goals - 04/12/17 1132      PT LONG TERM GOAL #1   Title  Patient will demonstrate improved function with daily tasks and decreased back pain as indicated by MODI score of 40% or better by 05/09/2017    Baseline MODI 58%   Status New     PT LONG TERM GOAL #2   Title  Patient will demonstrate improved function with progressing towards prior level of funciton with daily tasks and decreased back pain as indicated by MODI score of 20% or better by 06/07/2017    Baseline MDI 58%   Status New     PT LONG TERM GOAL #3   Title Patient will demonstrate improved posture awareness and pain control strategies to allow patient to sit/stand for >30 min. by 05/22/2017   Baseline unable to sit or stand >10 - 30 min. without increased pain   Status New     PT LONG TERM GOAL #4   Title Patient will be independent with home program for posture awareness, pain control, progressive exercises to allow patient to transition to self management once discharged from physical therapy 06/07/2017   Baseline limited knowledge of exercises and progrression without guidance and instruction   Status New               Plan - 06/02/17 0949    Clinical Impression Statement Patient demonstrates improvement in strength and endurance with  exercises with minimal cuing and able to perform standing exercises with less difficulty. He continues iwih decreased strength and endurance for daily activities and has soreness in lower back as he continues to heal from surgery. He will benefit from continued physical therapy intervention to achieve goals.    Rehab Potential Good   Clinical Impairments Affecting Rehab Potential (+) acute condition s/p surgery, motivated, family support (-)co morbidities; shoulder surgeries, TKR bilateral, aortic anuerysm, pacemaker, CA   PT Frequency 2x / week   PT Duration 8 weeks   PT Treatment/Interventions Therapeutic exercise;Moist Heat;Patient/family education;Manual techniques;Gait training   PT Next Visit Plan progressive exercise; re assess MODI; re certification   PT Home Exercise Plan ROM LE's seated ball rolling under foot, hip adduction, glute sets, core stabilization with resistive bands for scapular row right UE only,       Patient will benefit from skilled therapeutic intervention in order to improve the following deficits and impairments:  Decreased strength, Decreased activity tolerance, Impaired perceived functional ability, Pain, Increased muscle spasms, Decreased endurance, Decreased range of motion, Difficulty walking  Visit Diagnosis: Muscle weakness (generalized)  Difficulty in walking, not elsewhere classified  Acute low back pain without sciatica, unspecified back pain laterality     Problem List Patient Active Problem List   Diagnosis Date Noted  . Spondylolisthesis of lumbosacral region 03/16/2017  . Low back pain 02/01/2017  . Escherichia coli (E. coli) infection 01/28/2017  . Acute on chronic renal failure ([place]) 01/28/2017  . Elevated troponin 01/28/2017  . Generalized weakness 01/28/2017  . Essential hypertension 01/28/2017  . UTI (urinary tract infection) 01/26/2017  . AAA (abdominal aortic aneurysm) without rupture ([place]) 11/23/2016  . Chronic obstructive pulmonary  disease ([place]) 10/09/2015  . Personal history of diseases of skin or subcutaneous tissue 10/09/2015  . Malignant neoplasm of prostate ([place]) 10/09/2015  . Pure hypercholesterolemia 10/09/2015  . Sinoatrial node dysfunction ([place]) 10/09/2015  .  History of surgical procedure 08/22/2015  . Status post total shoulder replacement 07/10/2015  . Arthritis of shoulder region, degenerative 06/10/2015  . Benign essential HTN 06/03/2015  . Absolute anemia 04/12/2015  . Atrophic kidney 04/12/2015  . Essential (primary) hypertension 04/12/2015  . Acid reflux 04/12/2015  . Cancer of lung ([place]) 04/12/2015  . CA of prostate ([place]) 04/12/2015  . H/O adenomatous polyp of colon 01/15/2015  . Temporary cerebral vascular dysfunction 11/21/2014  . Calcific shoulder tendinitis 08/01/2014  . Cervical nerve root disorder 04/07/2012    [name] PT 06/03/2017, 11:35 AM  [facility] [address], [place], [address] Phone: [phone]   Fax:  [phone]  Name: Jerrelle Michelsen. MRN: [id number] Date of Birth: 1941-02-03

## 2017-06-06 ENCOUNTER — Observation Stay
Admission: EM | Admit: 2017-06-06 | Discharge: 2017-06-08 | Disposition: A | Discharge status: Home / Self Care | Payer: PPO | Attending: Internal Medicine | Admitting: Internal Medicine

## 2017-06-06 DIAGNOSIS — J449 Chronic obstructive pulmonary disease, unspecified: Secondary | ICD-10-CM | POA: Diagnosis not present

## 2017-06-06 DIAGNOSIS — N183 Chronic kidney disease, stage 3 (moderate): Secondary | ICD-10-CM | POA: Diagnosis not present

## 2017-06-06 DIAGNOSIS — I2510 Atherosclerotic heart disease of native coronary artery without angina pectoris: Secondary | ICD-10-CM | POA: Insufficient documentation

## 2017-06-06 DIAGNOSIS — I714 Abdominal aortic aneurysm, without rupture: Secondary | ICD-10-CM | POA: Insufficient documentation

## 2017-06-06 DIAGNOSIS — M5136 Other intervertebral disc degeneration, lumbar region: Secondary | ICD-10-CM | POA: Insufficient documentation

## 2017-06-06 DIAGNOSIS — F419 Anxiety disorder, unspecified: Secondary | ICD-10-CM | POA: Diagnosis not present

## 2017-06-06 DIAGNOSIS — I129 Hypertensive chronic kidney disease with stage 1 through stage 4 chronic kidney disease, or unspecified chronic kidney disease: Secondary | ICD-10-CM | POA: Diagnosis not present

## 2017-06-06 DIAGNOSIS — Z950 Presence of cardiac pacemaker: Secondary | ICD-10-CM | POA: Diagnosis not present

## 2017-06-06 DIAGNOSIS — M5412 Radiculopathy, cervical region: Secondary | ICD-10-CM | POA: Insufficient documentation

## 2017-06-06 DIAGNOSIS — E7800 Pure hypercholesterolemia, unspecified: Secondary | ICD-10-CM | POA: Diagnosis not present

## 2017-06-06 DIAGNOSIS — Z7982 Long term (current) use of aspirin: Secondary | ICD-10-CM | POA: Diagnosis not present

## 2017-06-06 DIAGNOSIS — Z981 Arthrodesis status: Secondary | ICD-10-CM | POA: Insufficient documentation

## 2017-06-06 DIAGNOSIS — R531 Weakness: Secondary | ICD-10-CM

## 2017-06-06 DIAGNOSIS — Z8673 Personal history of transient ischemic attack (TIA), and cerebral infarction without residual deficits: Secondary | ICD-10-CM | POA: Diagnosis not present

## 2017-06-06 DIAGNOSIS — E860 Dehydration: Secondary | ICD-10-CM | POA: Insufficient documentation

## 2017-06-06 DIAGNOSIS — Z79899 Other long term (current) drug therapy: Secondary | ICD-10-CM | POA: Diagnosis not present

## 2017-06-06 DIAGNOSIS — S52022A Displaced fracture of olecranon process without intraarticular extension of left ulna, initial encounter for closed fracture: Secondary | ICD-10-CM | POA: Diagnosis not present

## 2017-06-06 DIAGNOSIS — Z7902 Long term (current) use of antithrombotics/antiplatelets: Secondary | ICD-10-CM | POA: Insufficient documentation

## 2017-06-06 DIAGNOSIS — K219 Gastro-esophageal reflux disease without esophagitis: Secondary | ICD-10-CM | POA: Insufficient documentation

## 2017-06-06 LAB — CBC
HCT: 37.6 % — ABNORMAL LOW (ref 40.0–52.0)
Hemoglobin: 12.9 g/dL — ABNORMAL LOW (ref 13.0–18.0)
MCH: 28.7 pg (ref 26.0–34.0)
MCHC: 34.4 g/dL (ref 32.0–36.0)
MCV: 83.3 fL (ref 80.0–100.0)
PLATELETS: 173 10*3/uL (ref 150–440)
RBC: 4.51 MIL/uL (ref 4.40–5.90)
RDW: 15.5 % — AB (ref 11.5–14.5)
WBC: 7.4 10*3/uL (ref 3.8–10.6)

## 2017-06-06 LAB — TROPONIN I

## 2017-06-06 LAB — BASIC METABOLIC PANEL
Anion gap: 10 (ref 5–15)
BUN: 33 mg/dL — AB (ref 6–20)
CALCIUM: 9.1 mg/dL (ref 8.9–10.3)
CO2: 23 mmol/L (ref 22–32)
CREATININE: 2.09 mg/dL — AB (ref 0.61–1.24)
Chloride: 107 mmol/L (ref 101–111)
GFR calc non Af Amer: 29 mL/min — ABNORMAL LOW (ref >60)
GFR, EST AFRICAN AMERICAN: 34 mL/min — AB (ref >60)
Glucose, Bld: 107 mg/dL — ABNORMAL HIGH (ref 65–99)
Potassium: 4.3 mmol/L (ref 3.5–5.1)
SODIUM: 140 mmol/L (ref 135–145)

## 2017-06-06 MED ORDER — SODIUM CHLORIDE 0.9 % IV BOLUS (SEPSIS)
500.0000 mL | Freq: Once | INTRAVENOUS | Status: AC
  Administered 2017-06-06: 500 mL via INTRAVENOUS

## 2017-06-06 NOTE — ED Provider Notes (Signed)
[facility] Emergency Department Provider Note   First MD Initiated Contact with Patient 06/06/17 2308     (approximate)  I have reviewed the triage vital signs and the nursing notes.   HISTORY  Chief Complaint Weakness    HPI Rick T Nilton Lave. is a 76 y.o. male with below list of chronic medical conditions presents to the emergency department with acute onset of generalized weakness with onset this afternoon. Patient denies any headache no nausea or vomiting no diarrhea or constipation. Patient's wife at bedside states that the patient had similar symptoms last time he had a urinary tract infection in addition she believes that he has not been consuming an adequate amount of water.   Past Medical History:  Diagnosis Date  . AAA (abdominal aortic aneurysm) ([place])   . AAA (abdominal aortic aneurysm) without rupture ([place])   . Anemia   . Aneurysm ([place])    abd aortic  . Anxiety   . Atrophic kidney   . Cervical radiculopathy   . Chronic airway obstruction (HCC)    not aware of this  . Chronic kidney disease (CKD), stage III (moderate)    followed by Dr. Johnny Bridge  . Chronic tension headaches   . Coronary artery disease   . Coronary atherosclerosis of autologous vein bypass graft   . DDD (degenerative disc disease), lumbar   . Degenerative disc disease, lumbar    with lumbar radiculopathy  . Dyspnea   . Elbow fracture, left   . GERD (gastroesophageal reflux disease)   . H/O adenomatous polyp of colon   . H/O hemorrhoids   . H/O urticaria   . Headache   . Heart disease   . Hypercholesteremia   . Hyperlipidemia   . 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 Lucky Cowboy  . Lung cancer ([place])   . Lung cancer ([place])   . Meralgia paresthetica   . Meralgia paresthetica   . Neuralgia   . Osteoarthritis   . Osteoarthritis    s/p L knee surgery  . 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  Presence of permanent cardiac pacemaker   . Prostate cancer ([place])   . Second degree AV block    Followed by Dr. Nehemiah Massed  . Sinoatrial node dysfunction (HCC)   . Status post partial lobectomy of lung    bottom right   . Stroke ([place])   . TIA (transient ischemic attack)     Patient Active Problem List   Diagnosis Date Noted  . Weakness 06/07/2017  . Spondylolisthesis of lumbosacral region 03/16/2017  . Low back pain 02/01/2017  . Escherichia coli (E. coli) infection 01/28/2017  . Acute on chronic renal failure ([place]) 01/28/2017  . Elevated troponin 01/28/2017  . Generalized weakness 01/28/2017  . Essential hypertension 01/28/2017  . UTI (urinary tract infection) 01/26/2017  . AAA (abdominal aortic aneurysm) without rupture ([place]) 11/23/2016  . Chronic obstructive pulmonary disease ([place]) 10/09/2015  . Personal history of diseases of skin or subcutaneous tissue 10/09/2015  . Malignant neoplasm of prostate ([place]) 10/09/2015  . Pure hypercholesterolemia 10/09/2015  . Sinoatrial node dysfunction ([place]) 10/09/2015  . History of surgical procedure 08/22/2015  . Status post total shoulder replacement 07/10/2015  . Arthritis of shoulder region, degenerative 06/10/2015  . Benign essential HTN 06/03/2015  . Absolute anemia 04/12/2015  . Atrophic kidney 04/12/2015  . Essential (primary) hypertension  04/12/2015  . Acid reflux 04/12/2015  . Cancer of lung ([place]) 04/12/2015  . CA of prostate ([place]) 04/12/2015  . H/O adenomatous polyp of colon 01/15/2015  . Temporary cerebral vascular dysfunction 11/21/2014  . Calcific shoulder tendinitis 08/01/2014  . Cervical nerve root disorder 04/07/2012    Past Surgical History:  Procedure Laterality Date  . CATARACT EXTRACTION    . COLONOSCOPY    . COLONOSCOPY    . COLONOSCOPY WITH PROPOFOL N/A 10/20/2015   Procedure: COLONOSCOPY WITH PROPOFOL;  Surgeon: Manya Silvas, MD;  Location: [facility] ENDOSCOPY;  Service: Endoscopy;  Laterality: N/A;  .  CORONARY ARTERY BYPASS GRAFT     triple  . coronary atherosclerosis of autologous vein bypass graft    . EMBOLIZATION Right 12/27/2016   Procedure: Embolization;  Surgeon: Algernon Huxley, MD;  Location: [facility] CV LAB;  Service: Cardiovascular;  Laterality: Right;  . ENDOVASCULAR REPAIR/STENT GRAFT N/A 01/05/2017   Procedure: Endovascular Repair/Stent Graft;  Surgeon: Algernon Huxley, MD;  Location: [place] [facility];  Service: Cardiovascular;  Laterality: N/A;  . EYE SURGERY Bilateral    cataract extraction  . JOINT REPLACEMENT     shoulder and knees  . KNEE ARTHROSCOPY    . LOBECTOMY  01/31/13   RLL w/squamous cell carcinoma lobectomy  . LUNG REMOVAL, PARTIAL  2014   right lower lobe  . PACEMAKER INSERTION    . PACEMAKER INSERTION  12/2012   Dual chanber pacemaker generator  . partial shoulder replacement Right   . POLYPECTOMY    . PROSTATECTOMY    . TOTAL KNEE ARTHROPLASTY Bilateral   . TOTAL SHOULDER ARTHROPLASTY Left 07/10/2015   Procedure: TOTAL SHOULDER ARTHROPLASTY;  Surgeon: [name], MD;  Location: [facility] ORS;  Service: Orthopedics;  Laterality: Left;  . TOTAL SHOULDER REPLACEMENT      Prior to Admission medications   Medication Sig Start Date End Date Taking? Authorizing Provider  amLODipine (NORVASC) 5 MG tablet Take 1 tablet (5 mg total) by mouth daily. 01/29/17  Yes Theodoro Grist, MD  aspirin EC 81 MG tablet Take 81 mg by mouth daily.    Yes [provider]  atorvastatin (LIPITOR) 40 MG tablet Take 40 mg by mouth at bedtime.  10/30/14  Yes [provider]  Calcium Carbonate-Vitamin D3 (CALCIUM 600-D) 600-400 MG-UNIT TABS Take 1 tablet by mouth daily.   Yes [provider]  carisoprodol (SOMA) 350 MG tablet Take 350 mg by mouth 4 (four) times daily as needed for muscle spasms.   Yes [provider]  cetirizine (ZYRTEC) 10 MG tablet Take 10 mg by mouth daily.   Yes [provider]  clopidogrel (PLAVIX) 75 MG tablet Take 75 mg  by mouth daily.  10/30/14  Yes [provider]  clotrimazole-betamethasone (LOTRISONE) cream Apply 1 application topically 2 (two) times daily as needed (rash).   Yes [provider]  docusate sodium (COLACE) 100 MG capsule Take 100 mg by mouth daily.   Yes [provider]  donepezil (ARICEPT) 5 MG tablet Take 5 mg by mouth at bedtime.   Yes [provider]  ferrous sulfate (SLOW FE) 160 (50 FE) MG TBCR SR tablet Take 1 tablet by mouth daily.    Yes [provider]  metoprolol succinate (TOPROL-XL) 25 MG 24 hr tablet Take 25 mg by mouth daily. 05/24/17  Yes [provider]  Multiple Vitamin (MULTIVITAMIN WITH MINERALS) TABS tablet Take 1 tablet by mouth 2 (two) times daily.  Yes [provider]  Multiple Vitamins-Minerals (PRESERVISION/LUTEIN) CAPS Take 1 capsule by mouth 2 (two) times daily.    Yes [provider]  niacin (NIASPAN) 500 MG CR tablet Take 500 mg by mouth at bedtime.   Yes [provider]  Omega-3 Fatty Acids (FISH OIL) 1200 MG CAPS Take 2 capsules by mouth 2 (two) times daily.    Yes [provider]  omeprazole (PRILOSEC) 20 MG capsule Take 20 mg by mouth daily.  10/30/14  Yes [provider]  oxyCODONE (OXY IR/ROXICODONE) 5 MG immediate release tablet Take 5 mg by mouth every 4 (four) hours as needed for severe pain.   Yes [provider]  polyethylene glycol (MIRALAX / GLYCOLAX) packet Take 17 g by mouth daily.   Yes [provider]  traMADol-acetaminophen (ULTRACET) 37.5-325 MG tablet Take 1 tablet by mouth 2 (two) times daily.  03/04/17  Yes [provider]  traMADol-acetaminophen (ULTRACET) 37.5-325 MG tablet Take 2 tablets by mouth every 6 (six) hours as needed for moderate pain.   Yes [provider]  vitamin B-12 (CYANOCOBALAMIN) 1000 MCG tablet Take 1,000 mcg by mouth daily.   Yes [provider]  zolpidem (AMBIEN) 10 MG tablet Take 10  mg by mouth at bedtime as needed for sleep.  10/30/14  Yes [provider]    Allergies No known drug allergies  Family History  Problem Relation Age of Onset  . Heart attack Mother   . Heart attack Father   . Breast cancer Sister   . Asthma Sister     Social History Social History  Substance Use Topics  . Smoking status: Former Smoker    Packs/day: 1.50    Years: 45.00    Quit date: 04/07/2004  . Smokeless tobacco: Never Used  . Alcohol use No    Review of Systems Constitutional: No fever/chills Eyes: No visual changes. ENT: No sore throat. Cardiovascular: Denies chest pain. Respiratory: Denies shortness of breath. Gastrointestinal: No abdominal pain.  No nausea, no vomiting.  No diarrhea.  No constipation. Genitourinary: Negative for dysuria. Musculoskeletal: Negative for neck pain.  Negative for back pain. Integumentary: Negative for rash. Neurological: Negative for headaches, focal weakness or numbness. Positive for generalized weakness   ____________________________________________   PHYSICAL EXAM:  VITAL SIGNS: ED Triage Vitals  Enc Vitals Group     BP 06/06/17 2244 (!) 146/96     Pulse Rate 06/06/17 2244 93     Resp 06/06/17 2244 (!) 29     Temp 06/06/17 2244 98.9 F (37.2 C)     Temp Source 06/06/17 2244 Oral     SpO2 06/06/17 2244 98 %     Weight 06/06/17 2245 104.3 kg (230 lb)     Height 06/06/17 2245 1.803 m (5\' 11" )     Head Circumference --      Peak Flow --      Pain Score --      Pain Loc --      Pain Edu? --      Excl. in [place]? --     Constitutional: Alert and oriented. Well appearing and in no acute distress. Eyes: Conjunctivae are normal. PERRL. EOMI. Head: Atraumatic. Mouth/Throat: Mucous membranes are moist.  Oropharynx non-erythematous. Neck: No stridor.  No meningeal signs.   Cardiovascular: Normal rate, regular rhythm. Good peripheral circulation. Grossly normal heart sounds. Respiratory: Normal respiratory effort.  No  retractions. Lungs CTAB. Gastrointestinal: Soft and nontender. No distention.  Musculoskeletal: No lower extremity  tenderness nor edema. No gross deformities of extremities. Neurologic:  Normal speech and language. No gross focal neurologic deficits are appreciated.  Skin:  Skin is warm, dry and intact. No rash noted. Psychiatric: Mood and affect are normal. Speech and behavior are normal.  ____________________________________________   LABS (all labs ordered are listed, but only abnormal results are displayed)  Labs Reviewed  BASIC METABOLIC PANEL - Abnormal; Notable for the following:       Result Value   Glucose, Bld 107 (*)    BUN 33 (*)    Creatinine, Ser 2.09 (*)    GFR calc non Af Amer 29 (*)    GFR calc Af Amer 34 (*)    All other components within normal limits  CBC - Abnormal; Notable for the following:    Hemoglobin 12.9 (*)    HCT 37.6 (*)    RDW 15.5 (*)    All other components within normal limits  URINALYSIS, COMPLETE (UACMP) WITH MICROSCOPIC - Abnormal; Notable for the following:    Color, Urine YELLOW (*)    APPearance CLEAR (*)    Squamous Epithelial / LPF 0-5 (*)    All other components within normal limits  TROPONIN I  TROPONIN I  TSH  HEMOGLOBIN A1C  CBG MONITORING, ED   ____________________________________________  EKG  ED ECG REPORT I, Pe Ell N Dijuan Sleeth, the attending physician, personally viewed and interpreted this ECG.   Date: 06/06/2017  EKG Time: 10:43 PM  Rate: 91  Rhythm: Normal sinus rhythm  Axis: Normal  Intervals: Normal  ST&T Change: None   Procedures   ____________________________________________   INITIAL IMPRESSION / ASSESSMENT AND PLAN / ED COURSE  Pertinent labs & imaging results that were available during my care of the patient were reviewed by me and considered in my medical decision making (see chart for details).  76 year old male presenting to the emergency department with acute onset of generalized weakness.  Patient unable to ambulate at this time laboratory data unremarkable. Given patient's inability to ambulate patient discussed with Dr. Marcille Blanco for hospital admission for further evaluation and management      ____________________________________________  FINAL CLINICAL IMPRESSION(S) / ED DIAGNOSES  Generalized weakness  MEDICATIONS GIVEN DURING THIS VISIT:  Medications  aspirin EC tablet 81 mg (not administered)  atorvastatin (LIPITOR) tablet 40 mg (not administered)  clopidogrel (PLAVIX) tablet 75 mg (not administered)  pantoprazole (PROTONIX) EC tablet 40 mg (not administered)  multivitamin-lutein (OCUVITE-LUTEIN) capsule 1 capsule (not administered)  zolpidem (AMBIEN) tablet 5 mg (not administered)  donepezil (ARICEPT) tablet 5 mg (not administered)  vitamin B-12 (CYANOCOBALAMIN) tablet 1,000 mcg (not administered)  amLODipine (NORVASC) tablet 5 mg (not administered)  cyclobenzaprine (FLEXERIL) tablet 10 mg (not administered)  bisacodyl (DULCOLAX) suppository 10 mg (not administered)  loratadine (CLARITIN) tablet 10 mg (not administered)  multivitamin with minerals tablet 1 tablet (not administered)  oxyCODONE (OXYCONTIN) 12 hr tablet 15 mg (not administered)  predniSONE (DELTASONE) tablet 40 mg (not administered)  senna-docusate (Senokot-S) tablet 2 tablet (not administered)  oxyCODONE-acetaminophen (PERCOCET/ROXICET) 5-325 MG per tablet 1-2 tablet (not administered)  heparin injection 5,000 Units (5,000 Units Subcutaneous Given 06/07/17 0500)  0.9 %  sodium chloride infusion ( Intravenous New Bag/Given 06/07/17 0447)  acetaminophen (TYLENOL) tablet 650 mg (not administered)    Or  acetaminophen (TYLENOL) suppository 650 mg (not administered)  ondansetron (ZOFRAN) tablet 4 mg (not administered)    Or  ondansetron (ZOFRAN) injection 4 mg (not administered)  docusate sodium (COLACE) capsule 100 mg (not  administered)  calcium-vitamin D (OSCAL WITH D) 500-200 MG-UNIT per tablet  1 tablet (not administered)  sodium chloride 0.9 % bolus 500 mL (0 mLs Intravenous Stopped 06/07/17 0129)     NEW OUTPATIENT MEDICATIONS STARTED DURING THIS VISIT:  Current Discharge Medication List      Current Discharge Medication List      Current Discharge Medication List    STOP taking these medications     bisacodyl (DULCOLAX) 10 MG suppository Comments:  Reason for Stopping:       cyclobenzaprine (FLEXERIL) 10 MG tablet Comments:  Reason for Stopping:       oxyCODONE (OXYCONTIN) 15 mg 12 hr tablet Comments:  Reason for Stopping:       predniSONE (DELTASONE) 20 MG tablet Comments:  Reason for Stopping:       senna-docusate (SENOKOT-S) 8.6-50 MG tablet Comments:  Reason for Stopping:           Note:  This document was prepared using Dragon voice recognition software and may include unintentional dictation errors.    Gregor Hams, MD 06/07/17 [phone]

## 2017-06-06 NOTE — ED Triage Notes (Signed)
Pt came in via [facility] from home with c/o weakness.  Pt has previous history of UTI with similar symptoms.  Pt has recent elbow break.  Pt also reports having more stress and having a lot of things to do in the past few weeks.  Wife reports that pt is not drinking as much fluids as he normally does either.  Pt went to [name] earlier and states that he felt fine before, but started to feel weaker afterwards.  Pt is A&Ox4, but wife reports some short term memory issues due to past stroke.

## 2017-06-07 ENCOUNTER — Ambulatory Visit: Payer: PPO | Admitting: Physical Therapy

## 2017-06-07 DIAGNOSIS — I10 Essential (primary) hypertension: Secondary | ICD-10-CM | POA: Diagnosis not present

## 2017-06-07 DIAGNOSIS — R531 Weakness: Secondary | ICD-10-CM | POA: Diagnosis not present

## 2017-06-07 DIAGNOSIS — I2510 Atherosclerotic heart disease of native coronary artery without angina pectoris: Secondary | ICD-10-CM | POA: Diagnosis not present

## 2017-06-07 DIAGNOSIS — I714 Abdominal aortic aneurysm, without rupture: Secondary | ICD-10-CM | POA: Diagnosis not present

## 2017-06-07 LAB — URINALYSIS, COMPLETE (UACMP) WITH MICROSCOPIC
Bacteria, UA: NONE SEEN
Bilirubin Urine: NEGATIVE
GLUCOSE, UA: NEGATIVE mg/dL
HGB URINE DIPSTICK: NEGATIVE
KETONES UR: NEGATIVE mg/dL
LEUKOCYTES UA: NEGATIVE
Nitrite: NEGATIVE
PH: 5 (ref 5.0–8.0)
Protein, ur: NEGATIVE mg/dL
Specific Gravity, Urine: 1.016 (ref 1.005–1.030)

## 2017-06-07 LAB — TSH: TSH: 3.077 u[IU]/mL (ref 0.350–4.500)

## 2017-06-07 LAB — TROPONIN I: Troponin I: <0.03 ng/mL (ref <0.03)

## 2017-06-07 MED ORDER — ACETAMINOPHEN 650 MG RE SUPP: 650.0000 mg | Freq: Four times a day (QID) | RECTAL | Status: DC | PRN

## 2017-06-07 MED ORDER — DOCUSATE SODIUM 100 MG PO CAPS
100.0000 mg | ORAL_CAPSULE | Freq: Two times a day (BID) | ORAL | Status: DC
  Administered 2017-06-07 – 2017-06-08 (×3): 100 mg via ORAL
  Filled 2017-06-07 (×3): qty 1

## 2017-06-07 MED ORDER — CLOPIDOGREL BISULFATE 75 MG PO TABS
75.0000 mg | ORAL_TABLET | Freq: Every day | ORAL | Status: DC
  Administered 2017-06-07 – 2017-06-08 (×2): 75 mg via ORAL
  Filled 2017-06-07 (×2): qty 1

## 2017-06-07 MED ORDER — OCUVITE-LUTEIN PO CAPS
1.0000 | ORAL_CAPSULE | Freq: Two times a day (BID) | ORAL | Status: DC
  Administered 2017-06-07 – 2017-06-08 (×3): 1 via ORAL
  Filled 2017-06-07 (×4): qty 1

## 2017-06-07 MED ORDER — ONDANSETRON HCL 4 MG PO TABS: 4.0000 mg | ORAL_TABLET | Freq: Four times a day (QID) | ORAL | Status: DC | PRN

## 2017-06-07 MED ORDER — PANTOPRAZOLE SODIUM 40 MG PO TBEC
40.0000 mg | DELAYED_RELEASE_TABLET | Freq: Every day | ORAL | Status: DC
  Administered 2017-06-07 – 2017-06-08 (×2): 40 mg via ORAL
  Filled 2017-06-07 (×2): qty 1

## 2017-06-07 MED ORDER — ADULT MULTIVITAMIN W/MINERALS CH
1.0000 | ORAL_TABLET | Freq: Two times a day (BID) | ORAL | Status: DC
  Administered 2017-06-07 – 2017-06-08 (×3): 1 via ORAL
  Filled 2017-06-07 (×3): qty 1

## 2017-06-07 MED ORDER — CALCIUM CARBONATE-VITAMIN D 500-200 MG-UNIT PO TABS
1.0000 | ORAL_TABLET | Freq: Every day | ORAL | Status: DC
  Administered 2017-06-07 – 2017-06-08 (×2): 1 via ORAL
  Filled 2017-06-07 (×2): qty 1

## 2017-06-07 MED ORDER — SENNOSIDES-DOCUSATE SODIUM 8.6-50 MG PO TABS
2.0000 | ORAL_TABLET | Freq: Every day | ORAL | Status: DC
  Administered 2017-06-07 – 2017-06-08 (×2): 2 via ORAL
  Filled 2017-06-07 (×2): qty 2

## 2017-06-07 MED ORDER — ONDANSETRON HCL 4 MG/2ML IJ SOLN: 4.0000 mg | Freq: Four times a day (QID) | INTRAMUSCULAR | Status: DC | PRN

## 2017-06-07 MED ORDER — VITAMIN B-12 1000 MCG PO TABS
1000.0000 ug | ORAL_TABLET | Freq: Every day | ORAL | Status: DC
  Administered 2017-06-07 – 2017-06-08 (×2): 1000 ug via ORAL
  Filled 2017-06-07 (×2): qty 1

## 2017-06-07 MED ORDER — DONEPEZIL HCL 5 MG PO TABS
5.0000 mg | ORAL_TABLET | Freq: Every day | ORAL | Status: DC
  Administered 2017-06-07: 5 mg via ORAL
  Filled 2017-06-07 (×2): qty 1

## 2017-06-07 MED ORDER — CYCLOBENZAPRINE HCL 10 MG PO TABS: 10.0000 mg | ORAL_TABLET | Freq: Three times a day (TID) | ORAL | Status: DC | PRN

## 2017-06-07 MED ORDER — SODIUM CHLORIDE 0.9 % IV SOLN
INTRAVENOUS | Status: AC
  Administered 2017-06-07: 05:00:00 via INTRAVENOUS

## 2017-06-07 MED ORDER — LORATADINE 10 MG PO TABS
10.0000 mg | ORAL_TABLET | Freq: Every day | ORAL | Status: DC
  Administered 2017-06-07 – 2017-06-08 (×2): 10 mg via ORAL
  Filled 2017-06-07 (×2): qty 1

## 2017-06-07 MED ORDER — HEPARIN SODIUM (PORCINE) 5000 UNIT/ML IJ SOLN
5000.0000 [IU] | Freq: Three times a day (TID) | INTRAMUSCULAR | Status: DC
  Administered 2017-06-07 – 2017-06-08 (×4): 5000 [IU] via SUBCUTANEOUS
  Filled 2017-06-07 (×4): qty 1

## 2017-06-07 MED ORDER — ASPIRIN EC 81 MG PO TBEC
81.0000 mg | DELAYED_RELEASE_TABLET | Freq: Every day | ORAL | Status: DC
  Administered 2017-06-07 – 2017-06-08 (×2): 81 mg via ORAL
  Filled 2017-06-07 (×2): qty 1

## 2017-06-07 MED ORDER — BISACODYL 10 MG RE SUPP: 10.0000 mg | Freq: Every day | RECTAL | Status: DC | PRN

## 2017-06-07 MED ORDER — ACETAMINOPHEN 325 MG PO TABS
650.0000 mg | ORAL_TABLET | Freq: Four times a day (QID) | ORAL | Status: DC | PRN
  Administered 2017-06-07: 650 mg via ORAL
  Filled 2017-06-07: qty 2

## 2017-06-07 MED ORDER — OXYCODONE HCL ER 15 MG PO T12A: 15.0000 mg | EXTENDED_RELEASE_TABLET | Freq: Two times a day (BID) | ORAL | Status: DC

## 2017-06-07 MED ORDER — PREDNISONE 20 MG PO TABS
40.0000 mg | ORAL_TABLET | Freq: Every day | ORAL | Status: DC
  Administered 2017-06-07: 40 mg via ORAL
  Filled 2017-06-07: qty 2

## 2017-06-07 MED ORDER — AMLODIPINE BESYLATE 5 MG PO TABS
5.0000 mg | ORAL_TABLET | Freq: Every day | ORAL | Status: DC
  Administered 2017-06-07 – 2017-06-08 (×2): 5 mg via ORAL
  Filled 2017-06-07 (×2): qty 1

## 2017-06-07 MED ORDER — ATORVASTATIN CALCIUM 20 MG PO TABS
40.0000 mg | ORAL_TABLET | Freq: Every day | ORAL | Status: DC
  Administered 2017-06-07: 40 mg via ORAL
  Filled 2017-06-07: qty 2

## 2017-06-07 MED ORDER — ZOLPIDEM TARTRATE 5 MG PO TABS
5.0000 mg | ORAL_TABLET | Freq: Every evening | ORAL | Status: DC | PRN
Start: 2017-06-07 — End: 2017-06-08
  Administered 2017-06-07: 5 mg via ORAL
  Filled 2017-06-07: qty 1

## 2017-06-07 MED ORDER — METOPROLOL SUCCINATE ER 25 MG PO TB24
25.0000 mg | ORAL_TABLET | Freq: Every day | ORAL | Status: DC
  Administered 2017-06-07 – 2017-06-08 (×2): 25 mg via ORAL
  Filled 2017-06-07 (×2): qty 1

## 2017-06-07 MED ORDER — OXYCODONE-ACETAMINOPHEN 5-325 MG PO TABS
1.0000 | ORAL_TABLET | Freq: Four times a day (QID) | ORAL | Status: DC | PRN
  Administered 2017-06-07: 1 via ORAL
  Administered 2017-06-07 – 2017-06-08 (×3): 2 via ORAL
  Filled 2017-06-07 (×3): qty 2
  Filled 2017-06-07: qty 1

## 2017-06-07 MED ORDER — ZOLPIDEM TARTRATE 5 MG PO TABS: 5.0000 mg | ORAL_TABLET | Freq: Every day | ORAL | Status: DC

## 2017-06-07 MED ORDER — CALCIUM CARBONATE-VITAMIN D3 600-400 MG-UNIT PO TABS: 1.0000 | ORAL_TABLET | Freq: Every day | ORAL | Status: DC

## 2017-06-07 NOTE — Plan of Care (Signed)
Problem: Safety: Goal: Ability to remain free from injury will improve Outcome: Progressing Pt able to ring for assistance. Bed alarm activated.  Problem: Pain Managment: Goal: General experience of comfort will improve Outcome: Progressing Pain control with oral pain medication  Problem: Skin Integrity: Goal: Risk for impaired skin integrity will decrease Outcome: Progressing Surgical dressing dry and intact.

## 2017-06-07 NOTE — Progress Notes (Signed)
Pt arrived to floor alert and oriented. Medicated for pain x1. Pt able to use urinal with wife assistance. Iv infusing without difficulty.

## 2017-06-07 NOTE — Evaluation (Signed)
Occupational Therapy Evaluation Patient Details Name: Rick Mcbride. MRN: [id number] DOB: Mar 31, 1941 Today's Date: 06/07/2017    History of Present Illness Pt is a 76 y.o. Male who was admitted to [facility] with weakness. Pt. PMHx includes: recent elbow fx, recent spinal fusion (03/2017), recent AAA repair (12/2016), recent embolization (12/2016), stroke, anemia, anxiety, cervical radiculopathy, degenerative disc disease, SA node dysfunction, second degree AV block, pacemaker, lung cancer, partial lobectomy, prostate cancer, UTI, CKD, CAD, and COPD.   Clinical Impression   Pt. is a 76 y.o. Male who has had multiple surgeries in the past 3 months, and has had a recent elbow fracture 2 weeks ago. Pt. Presents with limited ability to engage his LUE into ADL tasks. Pt.'s wife has been assisting pt. At home, and plans to continue to assist him with his ADL/self-care care needs. Pt. has A/E from previous orthopedic surgeries, and is aware of how to use them if needed. Pt. Is using his right hand during ADL tasks. Pt. Plans to return home with his wife. No additional OT services are indicated at this time. Pt. And wife are in agreement as they have a system, and equipment in place that works for them.    Follow Up Recommendations  No OT follow up    Equipment Recommendations       Recommendations for Other Services       Precautions / Restrictions Precautions Precautions: Fall;ICD/Pacemaker Restrictions Weight Bearing Restrictions: Yes LUE Weight Bearing: Non weight bearing                                                    ADL either performed or assessed with clinical judgement   ADL Overall ADL's : Needs assistance/impaired Eating/Feeding: Set up   Grooming: Set up   Upper Body Bathing: Minimal assistance   Lower Body Bathing: Moderate assistance   Upper Body Dressing : Minimal assistance   Lower Body Dressing: Moderate assistance                Functional mobility during ADLs: Min guard General ADL Comments: Pt. family reports pt. has equipment from previous surgeries.     Vision Baseline Vision/History: Wears glasses Patient Visual Report: No change from baseline       Perception     Praxis      Pertinent Vitals/Pain Pain Assessment: No/denies pain     Hand Dominance Right   Extremity/Trunk Assessment Upper Extremity Assessment Upper Extremity Assessment: Generalized weakness (RUE WFL.) LUE Deficits / Details: Left elbow cast LUE: Unable to fully assess due to immobilization           Communication Communication Communication: No difficulties   Cognition Arousal/Alertness: Awake/alert Behavior During Therapy: WFL for tasks assessed/performed Overall Cognitive Status: Within Functional Limits for tasks assessed                                     General Comments       Exercises     Shoulder Instructions      Home Living Family/patient expects to be discharged to:: Private residence Living Arrangements: Spouse/significant other Available Help at Discharge: Family;Available 24 hours/day Type of Home: House       Home Layout: One level     Bathroom  Shower/Tub: Occupational psychologist: Handicapped height     Home Equipment: Environmental consultant - 2 wheels;Walker - 4 wheels;Cane - single point;Shower seat - built in;Grab bars - toilet;Grab bars - tub/shower;Wheelchair - manual          Prior Functioning/Environment Level of Independence: Independent with assistive device(s)        Comments: Pt.'s wife has recently been assisting pt. with daily ADL care since recent elbow fracture, and multiple surgeries over past several months.        OT Problem List: Decreased strength;Decreased range of motion;Impaired UE functional use;Decreased activity tolerance      OT Treatment/Interventions:      OT Goals(Current goals can be found in the care plan section) Acute Rehab OT  Goals Patient Stated Goal: To return home OT Goal Formulation: With patient Potential to Achieve Goals: Good  OT Frequency:     Barriers to D/C:            Co-evaluation              AM-PAC PT "6 Clicks" Daily Activity     Outcome Measure Help from another person eating meals?: A Little Help from another person taking care of personal grooming?: A Little Help from another person toileting, which includes using toliet, bedpan, or urinal?: A Lot Help from another person bathing (including washing, rinsing, drying)?: A Lot Help from another person to put on and taking off regular upper body clothing?: A Little Help from another person to put on and taking off regular lower body clothing?: A Lot 6 Click Score: 15   End of Session    Activity Tolerance: Patient tolerated treatment well Patient left: in chair;with call bell/phone within reach;with chair alarm set  OT Visit Diagnosis: Muscle weakness (generalized) (M62.81)                Time: 3154-0086 OT Time Calculation (min): 24 min Charges:  OT General Charges $OT Visit: 1 Procedure OT Evaluation $OT Eval Moderate Complexity: 1 Procedure G-Codes: OT G-codes **NOT FOR INPATIENT CLASS** Functional Assessment Tool Used: Clinical judgement Functional Limitation: Self care Self Care Current Status (P6195): At least 40 percent but less than 60 percent impaired, limited or restricted Self Care Goal Status (K9326): At least 1 percent but less than 20 percent impaired, limited or restricted   Harrel Carina, MS, OTR/L   Harrel Carina, MS, OTR/L 06/07/2017, 3:53 PM

## 2017-06-07 NOTE — Progress Notes (Signed)
Patient alert and oriented. Complaining of moderate pain in lower back relieved by pain medication and repositioning. Patient states that he is not feeling as weak as before. Up in chair. Oriented to call bell system/  Deri Fuelling, RN

## 2017-06-07 NOTE — Evaluation (Signed)
Physical Therapy Evaluation Patient Details Name: Rick Mcbride. MRN: [id number] DOB: [birth year] Today's Date: 06/07/2017   History of Present Illness  Pt is a 76 y.o. M presented to ED 06/06/2017 with complaints of weakness; admitted due to weakness. PMH: recent elbow fx, recent spinal fusion (03/2017), recent AAA repair (12/2016), recent embolization (12/2016), stroke, anemia, anxiety, cervical radiculopathy, degenerative disc disease, SA node dysfunction, second degree AV block, pacemaker, lung cancer, partial lobectomy, prostate cancer, UTI, CKD, CAD, COPD.    Clinical Impression  Prior to admission pt reports in home ambulation with no AD with furniture near by to hold on to; pt reports independence with functional mobility and ADLs; pt reports use of AD for ambulation in public. Pt reports 24/7 availability from family upon discharge. Pt currently requires min assist for bed mobility and CGA with SPC for tranfers and short ambulation. Pt reports 3/10 lower back pain beginning and end of session (requesting pain meds; nursing notified). Pt would continue to benefit from PT to address balance, strength, and functional mobility deficits (see below). Recommended discharge to home with outpatient PT.    Follow Up Recommendations Outpatient PT    Equipment Recommendations       Recommendations for Other Services       Precautions / Restrictions Precautions Precautions: Fall;ICD/Pacemaker Restrictions Weight Bearing Restrictions: Yes LUE Weight Bearing: Non weight bearing (pt reports he is not supposed to put weight through his L elbow (assumed non-weightbearing this session))      Mobility  Bed Mobility Overal bed mobility: Needs Assistance Bed Mobility: Supine to Sit     Supine to sit: Min assist;HOB elevated     General bed mobility comments: pt requires min assist for trunk to perform supine to sit with HOB elevated  Transfers Overall transfer level: Needs  assistance Equipment used: Straight cane Transfers: Sit to/from Stand Sit to Stand: Min guard         General transfer comment: pt able to perform sit to stand with CGA and SPC for safety; vc's for B LE placement  Ambulation/Gait Ambulation/Gait assistance: Min guard Ambulation Distance (Feet): 20 Feet     Gait velocity: decreased   General Gait Details: pt able to ambulate 20 ft in room with CGA and SPC for safety; pt demonstrates with short shuffling gait stating that he is hesitant to take large steps due to uncertainty; pt demonstrates larger B step length end of session; vc's for use of [facility]  Stairs            Wheelchair Mobility    Modified Rankin (Stroke Patients Only)       Balance Overall balance assessment: Needs assistance Sitting-balance support: No upper extremity supported;Feet supported Sitting balance-Leahy Scale: Fair Sitting balance - Comments: pt able to perform sitting balance with no UE support with B LE supported with no overt loss of balance noted   Standing balance support: Single extremity supported Standing balance-Leahy Scale: Poor Standing balance comment: pt demonstrates mild unsteadiness with standing balance; pt able to momentarily lift cane off ground with CGA with no overt loss of balance noted                             Pertinent Vitals/Pain Pain Assessment: 0-10 Pain Score: 3  Pain Location: pt reports 3/10 low back pain beginning and end of session Pain Descriptors / Indicators: Aching;Sore Pain Intervention(s): Limited activity within patient's tolerance;Monitored during session;Repositioned;Patient requesting pain meds-RN  notified;RN gave pain meds during session  HR - 82 to 100 bpm throughout session via telemetry O2 - 92 to 99% on RA throughout session via telemetry    Home Living Family/patient expects to be discharged to:: Private residence Living Arrangements: Spouse/significant other Available Help at  Discharge: Family;Available 24 hours/day Type of Home: House Home Access:  (1/2 step to enter door)     Home Layout: One level Home Equipment: Silt - 2 wheels;Walker - 4 wheels;Cane - single point;Shower seat - built in;Grab bars - toilet;Grab bars - tub/shower;Wheelchair - manual      Prior Function Level of Independence: Independent with assistive device(s)         Comments: pt able to perform household ambulation with no AD with furniture to hold on to; requires AD for public ambulation     Hand Dominance        Extremity/Trunk Assessment   Upper Extremity Assessment Upper Extremity Assessment: Generalized weakness;LUE deficits/detail LUE Deficits / Details: unable to fully assess due to L elbow fracture/cast LUE: Unable to fully assess due to immobilization    Lower Extremity Assessment Lower Extremity Assessment: Generalized weakness;RLE deficits/detail RLE Deficits / Details: decreased sensation at level L4/L5 compared to L LE; per pt this is baseline since his LE and back surgeries RLE Sensation: decreased light touch    Cervical / Trunk Assessment Cervical / Trunk Assessment: Normal  Communication   Communication: No difficulties  Cognition Arousal/Alertness: Awake/alert Behavior During Therapy: WFL for tasks assessed/performed Overall Cognitive Status: Within Functional Limits for tasks assessed                                        General Comments      Exercises Total Joint Exercises Marching in Standing: AROM;Strengthening;Both;10 reps;Standing (small standing marches) Other Exercises Other Exercises: B LE weight shifts in standing x10 (5 each LE)   Assessment/Plan    PT Assessment Patient needs continued PT services  PT Problem List Decreased strength;Decreased activity tolerance;Decreased balance;Decreased mobility;Decreased knowledge of use of DME;Pain       PT Treatment Interventions DME instruction;Gait  training;Functional mobility training;Stair training;Therapeutic activities;Therapeutic exercise;Balance training;Patient/family education    PT Goals (Current goals can be found in the Care Plan section)  Acute Rehab PT Goals Patient Stated Goal: to increase strength and return home PT Goal Formulation: With patient/family Time For Goal Achievement: 06/21/17 Potential to Achieve Goals: Good    Frequency Min 2X/week   Barriers to discharge        Co-evaluation               AM-PAC PT "6 Clicks" Daily Activity  Outcome Measure Difficulty turning over in bed (including adjusting bedclothes, sheets and blankets)?: A Little Difficulty moving from lying on back to sitting on the side of the bed? : Total Difficulty sitting down on and standing up from a chair with arms (e.g., wheelchair, bedside commode, etc,.)?: A Little Help needed moving to and from a bed to chair (including a wheelchair)?: A Little Help needed walking in hospital room?: A Little Help needed climbing 3-5 steps with a railing? : A Lot 6 Click Score: 15    End of Session Equipment Utilized During Treatment: Gait belt Activity Tolerance: Patient tolerated treatment well Patient left: in chair;with call bell/phone within reach;with chair alarm set;with family/visitor present Nurse Communication: Mobility status;Patient requests pain meds PT Visit Diagnosis:  Unsteadiness on feet (R26.81);Other abnormalities of gait and mobility (R26.89);Muscle weakness (generalized) (M62.81)    Time: 6016-5800 PT Time Calculation (min) (ACUTE ONLY): 41 min   Charges:         PT G CodesLaqueta Carina, SPT 07/02/2017,11:03 AM [phone]

## 2017-06-07 NOTE — Progress Notes (Addendum)
[name] [place] NAME: Rick Mcbride    MR#:  [id number]  DATE OF BIRTH:  1941-02-19  SUBJECTIVE:  CHIEF COMPLAINT:   Chief Complaint  Patient presents with  . Weakness   - Recent back surgery 2 months ago, also had a recent left elbow fracture from trauma and in splint -Admitted with extreme weakness and inability to walk. Feels better this morning except for low back pain.  REVIEW OF SYSTEMS:  Review of Systems  Constitutional: Positive for malaise/fatigue. Negative for chills and fever.  HENT: Negative for congestion, ear discharge, hearing loss and nosebleeds.   Eyes: Negative for blurred vision and double vision.  Respiratory: Negative for cough, shortness of breath and wheezing.   Cardiovascular: Negative for chest pain, palpitations and leg swelling.  Gastrointestinal: Negative for abdominal pain, constipation, diarrhea, nausea and vomiting.  Genitourinary: Negative for dysuria.  Musculoskeletal: Positive for back pain, joint pain and myalgias.  Neurological: Positive for weakness. Negative for dizziness, speech change, focal weakness, seizures and headaches.    DRUG ALLERGIES:  No Known Allergies  VITALS:  Blood pressure 140/71, pulse 74, temperature 98.9 F (37.2 C), temperature source Oral, resp. rate 16, height 5\' 11"  (1.803 m), weight 104.8 kg (231 lb), SpO2 98 %.  PHYSICAL EXAMINATION:  Physical Exam  GENERAL:  76 y.o.-year-old Obese patient lying in the bed with no acute distress.  EYES: Pupils equal, round, reactive to light and accommodation. No scleral icterus. Extraocular muscles intact.  HEENT: Head atraumatic, normocephalic. Oropharynx and nasopharynx clear.  NECK:  Supple, no jugular venous distention. No thyroid enlargement, no tenderness.  LUNGS: Normal breath sounds bilaterally, no wheezing, rales,rhonchi or crepitation. No use of accessory muscles of respiration.  CARDIOVASCULAR: S1, S2 normal. No   rubs, or gallops. 2/6 systolic murmur present ABDOMEN: Soft, nontender, nondistended. Bowel sounds present. No organomegaly or mass.  EXTREMITIES: No pedal edema, cyanosis, or clubbing. Low back pain present NEUROLOGIC: Cranial nerves II through XII are intact. Muscle strength 5/5 in both upper extremities, 4/5 in lower extremities, limited due to back pain. Sensation intact. Gait not checked.  PSYCHIATRIC: The patient is alert and oriented x 3.  SKIN: No obvious rash, lesion, or ulcer.    LABORATORY PANEL:   CBC  Recent Labs Lab 06/06/17 2253  WBC 7.4  HGB 12.9*  HCT 37.6*  PLT 173   ------------------------------------------------------------------------------------------------------------------  Chemistries   Recent Labs Lab 06/06/17 2253  NA 140  K 4.3  CL 107  CO2 23  GLUCOSE 107*  BUN 33*  CREATININE 2.09*  CALCIUM 9.1   ------------------------------------------------------------------------------------------------------------------  Cardiac Enzymes  Recent Labs Lab 06/07/17 0150  TROPONINI <0.03   ------------------------------------------------------------------------------------------------------------------  RADIOLOGY:  No results found.  EKG:   Orders placed or performed during the hospital encounter of 06/06/17  . EKG 12-Lead  . EKG 12-Lead  . ED EKG  . ED EKG    ASSESSMENT AND PLAN:   76 year old male with multiple medical problems including recent endovascular AAA repair in this ear, spinal fusion in May 2018, lung cancer status post lobectomy, history of stroke, CAD, CK D and left elbow fracture status post repair 2 weeks ago presents to hospital secondary to extreme weakness.  #1 generalized weakness-secondary to dehydration. -Patient has had a lot going on this year especially in the last couple of months. He still recovering from his lower back surgery. Complained of significant pain last night and inability to move. -Urine analysis  negative  for infection. Continue IV fluids for today. -PT/OT consults today.  #2 spinal fusion surgery-continue physical therapy. -Pain management  #3 history of CVA-no residual neurological deficits at this time. Continue aspirin, Plavix and statin.  #4 hypertension-continue home medications. Patient on Norvasc and metoprolol  #5 GERD-Protonix  #6 DVT prophylaxis-on subcutaneous heparin   All the records are reviewed and case discussed with Care Management/Social Workerr. Management plans discussed with the patient, family and they are in agreement.  CODE STATUS: Full code  TOTAL TIME TAKING CARE OF THIS PATIENT: 38 minutes.   POSSIBLE D/C tomorrow, DEPENDING ON CLINICAL CONDITION.   Gladstone Lighter M.D on 06/07/2017 at 1:41 PM  Between 7am to 6pm - Pager - [phone]  After 6pm go to [email or web address] - password [email or web address] [facility]  Office  [phone]  CC: Primary care physician; Idelle Crouch, MD

## 2017-06-07 NOTE — Care Management (Signed)
Met with patient and his wife at bedside to discuss discharge planning. PT recommending OP PT. He is already active with OP PT at [facility] on [address]. No further needs identified.

## 2017-06-07 NOTE — Care Management Obs Status (Signed)
[place] NOTIFICATION   Patient Details  Name: Rick Mcbride. MRN: [id number] Date of Birth: [birth year]   Medicare Observation Status Notification Given:  Yes    Jolly Mango, RN 06/07/2017, 8:22 AM

## 2017-06-07 NOTE — H&P (Signed)
Rick Mcbride. is an 76 y.o. male.   Chief Complaint: Weakness HPI: The patient with past medical history of elbow fracture, recent spinal fusion, endovascular AAA repair, lung cancer status post lobectomy as well as stroke, coronary artery disease and chronic kidney disease presents to the emergency department due to weakness. The patient saw his orthopedic surgeon today and had been ambulating with his cane or walker when he abruptly felt too weak to stand. He denies acute pain. He admits to being more active than usual due to his doctor's appointments as well as traveling to visit hospitalized friends. Multiple attempts to ambulate in the emergency department were unsuccessful which prompted the emergency department staff to call the hospitalist service for admission.  Past Medical History:  Diagnosis Date  . AAA (abdominal aortic aneurysm) ([place])   . AAA (abdominal aortic aneurysm) without rupture ([place])   . Anemia   . Aneurysm ([place])    abd aortic  . Anxiety   . Atrophic kidney   . Cervical radiculopathy   . Chronic airway obstruction (HCC)    not aware of this  . Chronic kidney disease (CKD), stage III (moderate)    followed by Dr. Johnny Bridge  . Chronic tension headaches   . Coronary artery disease   . Coronary atherosclerosis of autologous vein bypass graft   . DDD (degenerative disc disease), lumbar   . Degenerative disc disease, lumbar    with lumbar radiculopathy  . Dyspnea   . Elbow fracture, left   . GERD (gastroesophageal reflux disease)   . H/O adenomatous polyp of colon   . H/O hemorrhoids   . H/O urticaria   . Headache   . Heart disease   . Hypercholesteremia   . Hyperlipidemia   . 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 Lucky Cowboy  . Lung cancer ([place])   . Lung cancer ([place])   . Meralgia paresthetica   . Meralgia paresthetica   . Neuralgia   . Osteoarthritis   . Osteoarthritis    s/p L knee surgery  . 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 Presence of permanent cardiac pacemaker   . Prostate cancer ([place])   . Second degree AV block    Followed by Dr. Nehemiah Massed  . Sinoatrial node dysfunction (HCC)   . Status post partial lobectomy of lung    bottom right   . Stroke ([place])   . TIA (transient ischemic attack)     Past Surgical History:  Procedure Laterality Date  . CATARACT EXTRACTION    . COLONOSCOPY    . COLONOSCOPY    . COLONOSCOPY WITH PROPOFOL N/A 10/20/2015   Procedure: COLONOSCOPY WITH PROPOFOL;  Surgeon: Manya Silvas, MD;  Location: [facility] ENDOSCOPY;  Service: Endoscopy;  Laterality: N/A;  . CORONARY ARTERY BYPASS GRAFT     triple  . coronary atherosclerosis of autologous vein bypass graft    . EMBOLIZATION Right 12/27/2016   Procedure: Embolization;  Surgeon: Algernon Huxley, MD;  Location: [facility] CV LAB;  Service: Cardiovascular;  Laterality: Right;  . ENDOVASCULAR REPAIR/STENT GRAFT N/A 01/05/2017   Procedure: Endovascular Repair/Stent Graft;  Surgeon: Algernon Huxley, MD;  Location: [facility] CV LAB;  Service: Cardiovascular;  Laterality: N/A;  . EYE SURGERY Bilateral    cataract extraction  . JOINT REPLACEMENT     shoulder and knees  . KNEE ARTHROSCOPY    .  LOBECTOMY  01/31/13   RLL w/squamous cell carcinoma lobectomy  . LUNG REMOVAL, PARTIAL  2014   right lower lobe  . PACEMAKER INSERTION    . PACEMAKER INSERTION  12/2012   Dual chanber pacemaker generator  . partial shoulder replacement Right   . POLYPECTOMY    . PROSTATECTOMY    . TOTAL KNEE ARTHROPLASTY Bilateral   . TOTAL SHOULDER ARTHROPLASTY Left 07/10/2015   Procedure: TOTAL SHOULDER ARTHROPLASTY;  Surgeon: [name], MD;  Location: [facility] ORS;  Service: Orthopedics;  Laterality: Left;  . TOTAL SHOULDER REPLACEMENT      Family History  Problem Relation Age of Onset  . Heart attack Mother   . Heart attack Father   . Breast cancer Sister   . Asthma Sister    Social History:  reports  that he quit smoking about 13 years ago. He has a 67.50 pack-year smoking history. He has never used smokeless tobacco. He reports that he does not drink alcohol or use drugs.  Allergies: No Known Allergies  Medications Prior to Admission  Medication Sig Dispense Refill  . amLODipine (NORVASC) 5 MG tablet Take 1 tablet (5 mg total) by mouth daily. 7 tablet 0  . aspirin EC 81 MG tablet Take 81 mg by mouth daily.     Marland Kitchen atorvastatin (LIPITOR) 40 MG tablet Take 40 mg by mouth at bedtime.     . Calcium Carbonate-Vitamin D3 (CALCIUM 600-D) 600-400 MG-UNIT TABS Take 1 tablet by mouth daily.    . carisoprodol (SOMA) 350 MG tablet Take 350 mg by mouth 4 (four) times daily as needed for muscle spasms.    . cetirizine (ZYRTEC) 10 MG tablet Take 10 mg by mouth daily.    . clopidogrel (PLAVIX) 75 MG tablet Take 75 mg by mouth daily.     . clotrimazole-betamethasone (LOTRISONE) cream Apply 1 application topically 2 (two) times daily as needed (rash).    Marland Kitchen docusate sodium (COLACE) 100 MG capsule Take 100 mg by mouth daily.    Marland Kitchen donepezil (ARICEPT) 5 MG tablet Take 5 mg by mouth at bedtime.    . ferrous sulfate (SLOW FE) 160 (50 FE) MG TBCR SR tablet Take 1 tablet by mouth daily.     . metoprolol succinate (TOPROL-XL) 25 MG 24 hr tablet Take 25 mg by mouth daily.    . Multiple Vitamin (MULTIVITAMIN WITH MINERALS) TABS tablet Take 1 tablet by mouth 2 (two) times daily.    . Multiple Vitamins-Minerals (PRESERVISION/LUTEIN) CAPS Take 1 capsule by mouth 2 (two) times daily.     . niacin (NIASPAN) 500 MG CR tablet Take 500 mg by mouth at bedtime.    . Omega-3 Fatty Acids (FISH OIL) 1200 MG CAPS Take 2 capsules by mouth 2 (two) times daily.     Marland Kitchen omeprazole (PRILOSEC) 20 MG capsule Take 20 mg by mouth daily.     Marland Kitchen oxyCODONE (OXY IR/ROXICODONE) 5 MG immediate release tablet Take 5 mg by mouth every 4 (four) hours as needed for severe pain.    . polyethylene glycol (MIRALAX / GLYCOLAX) packet Take 17 g by mouth  daily.    . traMADol-acetaminophen (ULTRACET) 37.5-325 MG tablet Take 1 tablet by mouth 2 (two) times daily.     . traMADol-acetaminophen (ULTRACET) 37.5-325 MG tablet Take 2 tablets by mouth every 6 (six) hours as needed for moderate pain.    . vitamin B-12 (CYANOCOBALAMIN) 1000 MCG tablet Take 1,000 mcg by mouth daily.    Marland Kitchen zolpidem (AMBIEN)  10 MG tablet Take 10 mg by mouth at bedtime as needed for sleep.       Results for orders placed or performed during the hospital encounter of 06/06/17 (from the past 48 hour(s))  Basic metabolic panel     Status: Abnormal   Collection Time: 06/06/17 10:53 PM  Result Value Ref Range   Sodium 140 135 - 145 mmol/L   Potassium 4.3 3.5 - 5.1 mmol/L   Chloride 107 101 - 111 mmol/L   CO2 23 22 - 32 mmol/L   Glucose, Bld 107 (H) 65 - 99 mg/dL   BUN 33 (H) 6 - 20 mg/dL   Creatinine, Ser 2.09 (H) 0.61 - 1.24 mg/dL   Calcium 9.1 8.9 - 10.3 mg/dL   GFR calc non Af Amer 29 (L) >60 mL/min   GFR calc Af Amer 34 (L) >60 mL/min    Comment: (NOTE) The eGFR has been calculated using the CKD EPI equation. This calculation has not been validated in all clinical situations. eGFR's persistently <60 mL/min signify possible Chronic Kidney Disease.    Anion gap 10 5 - 15  CBC     Status: Abnormal   Collection Time: 06/06/17 10:53 PM  Result Value Ref Range   WBC 7.4 3.8 - 10.6 K/uL   RBC 4.51 4.40 - 5.90 MIL/uL   Hemoglobin 12.9 (L) 13.0 - 18.0 g/dL   HCT 37.6 (L) 40.0 - 52.0 %   MCV 83.3 80.0 - 100.0 fL   MCH 28.7 26.0 - 34.0 pg   MCHC 34.4 32.0 - 36.0 g/dL   RDW 15.5 (H) 11.5 - 14.5 %   Platelets 173 150 - 440 K/uL  Troponin I     Status: None   Collection Time: 06/06/17 10:53 PM  Result Value Ref Range   Troponin I <0.03 <0.03 ng/mL  Urinalysis, Complete w Microscopic     Status: Abnormal   Collection Time: 06/06/17 10:54 PM  Result Value Ref Range   Color, Urine YELLOW (A) YELLOW   APPearance CLEAR (A) CLEAR   Specific Gravity, Urine 1.016 1.005 -  1.030   pH 5.0 5.0 - 8.0   Glucose, UA NEGATIVE NEGATIVE mg/dL   Hgb urine dipstick NEGATIVE NEGATIVE   Bilirubin Urine NEGATIVE NEGATIVE   Ketones, ur NEGATIVE NEGATIVE mg/dL   Protein, ur NEGATIVE NEGATIVE mg/dL   Nitrite NEGATIVE NEGATIVE   Leukocytes, UA NEGATIVE NEGATIVE   RBC / HPF 0-5 0 - 5 RBC/hpf   WBC, UA 0-5 0 - 5 WBC/hpf   Bacteria, UA NONE SEEN NONE SEEN   Squamous Epithelial / LPF 0-5 (A) NONE SEEN  Troponin I     Status: None   Collection Time: 06/07/17  1:50 AM  Result Value Ref Range   Troponin I <0.03 <0.03 ng/mL   No results found.  Review of Systems  Constitutional: Negative for chills and fever.  HENT: Negative for sore throat and tinnitus.   Eyes: Negative for blurred vision and redness.  Respiratory: Negative for cough and shortness of breath.   Cardiovascular: Negative for chest pain, palpitations, orthopnea and PND.  Gastrointestinal: Negative for abdominal pain, diarrhea, nausea and vomiting.  Genitourinary: Negative for dysuria, frequency and urgency.  Musculoskeletal: Positive for joint pain (left elbow). Negative for myalgias.  Skin: Negative for rash.       No lesions  Neurological: Negative for speech change, focal weakness and weakness.  Endo/Heme/Allergies: Does not bruise/bleed easily.       No temperature intolerance  Psychiatric/Behavioral: Negative for depression and suicidal ideas.    Blood pressure (!) 147/76, pulse 80, temperature 98.9 F (37.2 C), temperature source Oral, resp. rate 19, height 5' 11" (1.803 m), weight 104.3 kg (230 lb), SpO2 100 %. Physical Exam  Vitals reviewed. Constitutional: He is oriented to person, place, and time. He appears well-developed and well-nourished. No distress.  HENT:  Head: Normocephalic and atraumatic.  Mouth/Throat: Oropharynx is clear and moist.  Eyes: Pupils are equal, round, and reactive to light. Conjunctivae and EOM are normal. No scleral icterus.  Neck: Normal range of motion. Neck  supple. No JVD present. No tracheal deviation present. No thyromegaly present.  Cardiovascular: Normal rate, regular rhythm and normal heart sounds.  Exam reveals no gallop and no friction rub.   No murmur heard. Respiratory: Effort normal and breath sounds normal. No respiratory distress.  GI: Soft. Bowel sounds are normal. He exhibits no distension. There is no tenderness.  Genitourinary:  Genitourinary Comments: Deferred  Musculoskeletal: Normal range of motion. He exhibits no edema.  Lymphadenopathy:    He has no cervical adenopathy.  Neurological: He is alert and oriented to person, place, and time. No cranial nerve deficit.  Skin: Skin is warm and dry. No rash noted. No erythema.  Psychiatric: He has a normal mood and affect. His behavior is normal. Judgment and thought content normal.     Assessment/Plan This is a 76 year old male admitted for generalized weakness. 1. Weakness: The patient is unable to ambulate. PT/OT eval. Hydrate with intravenous fluid 2. Essential hypertension: Elevated; continue amlodipine 3. CAD: Stable; continue aspirin and Plavix. 4. AAA: Status post repair. Stable. 5. Elbow fracture: Immobilized; manage pain. I have tried to alter his narcotic regimen in order to improve his alertness and energy. 6. Hyperlipidemia: Continue statin therapy 7. Mild cognitive delay: Continue Aricept 8. DVT prophylaxis: Heparin 9. GI prophylaxis: Pantoprazole per home regimen The patient is a full code. Time spent on admission orders and patient care approximately 45 minutes  Harrie Foreman, MD 06/07/2017, 4:59 AM

## 2017-06-08 DIAGNOSIS — R531 Weakness: Secondary | ICD-10-CM | POA: Diagnosis not present

## 2017-06-08 DIAGNOSIS — I714 Abdominal aortic aneurysm, without rupture: Secondary | ICD-10-CM | POA: Diagnosis not present

## 2017-06-08 DIAGNOSIS — I2510 Atherosclerotic heart disease of native coronary artery without angina pectoris: Secondary | ICD-10-CM | POA: Diagnosis not present

## 2017-06-08 DIAGNOSIS — I10 Essential (primary) hypertension: Secondary | ICD-10-CM | POA: Diagnosis not present

## 2017-06-08 LAB — HEMOGLOBIN A1C
Hgb A1c MFr Bld: 5.5 % (ref 4.8–5.6)
Mean Plasma Glucose: 111 mg/dL

## 2017-06-08 NOTE — Discharge Planning (Signed)
Patient IV removed.  RN assessment and VS revealed stability for DC to home.  DC papers given, explained and educated.  No scripts needed at this time.  Informed of suggested FU appts and appts made.  Once ready, patient will be wheeld to front and family transporting home via car.

## 2017-06-08 NOTE — Discharge Summary (Signed)
[facility] [place] NAME: Rick Mcbride    MR#:  [id number]  DATE OF BIRTH:  Feb 14, 1941  DATE OF ADMISSION:  06/06/2017   ADMITTING PHYSICIAN: Harrie Foreman, MD  DATE OF DISCHARGE: 06/08/17  PRIMARY CARE PHYSICIAN: Idelle Crouch, MD   ADMISSION DIAGNOSIS:   Weakness  DISCHARGE DIAGNOSIS:   Active Problems:   Weakness   SECONDARY DIAGNOSIS:   Past Medical History:  Diagnosis Date  . AAA (abdominal aortic aneurysm) ([place])   . AAA (abdominal aortic aneurysm) without rupture ([place])   . Anemia   . Aneurysm ([place])    abd aortic  . Anxiety   . Atrophic kidney   . Cervical radiculopathy   . Chronic airway obstruction (HCC)    not aware of this  . Chronic kidney disease (CKD), stage III (moderate)    followed by Dr. Johnny Bridge  . Chronic tension headaches   . Coronary artery disease   . Coronary atherosclerosis of autologous vein bypass graft   . DDD (degenerative disc disease), lumbar   . Degenerative disc disease, lumbar    with lumbar radiculopathy  . Dyspnea   . Elbow fracture, left   . GERD (gastroesophageal reflux disease)   . H/O adenomatous polyp of colon   . H/O hemorrhoids   . H/O urticaria   . Headache   . Heart disease   . Hypercholesteremia   . Hyperlipidemia   . 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 Lucky Cowboy  . Lung cancer ([place])   . Lung cancer ([place])   . Meralgia paresthetica   . Meralgia paresthetica   . Neuralgia   . Osteoarthritis   . Osteoarthritis    s/p L knee surgery  . 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 Presence of permanent cardiac pacemaker   . Prostate cancer ([place])   . Second degree AV block    Followed by Dr. Nehemiah Massed  . Sinoatrial node dysfunction (HCC)   . Status post partial lobectomy of lung    bottom right   . Stroke ([place])   . TIA (transient ischemic attack)     HOSPITAL COURSE:   76 year old  male with multiple medical problems including recent endovascular AAA repair in this ear, spinal fusion in May 2018, lung cancer status post lobectomy, history of stroke, CAD, CKD and left elbow fracture status post repair 2 weeks ago presents to hospital secondary to extreme weakness.  #1 generalized weakness-secondary to dehydration. -Patient has had a lot going on this year especially in the last couple of months. He still recovering from his lower back surgery. Complained of significant pain and inability to move prior to admission. -Urine analysis negative for infection. Received IV fluids in the hospital -Worked with physical therapy and had significant improvement and is back to his baseline at this time  #2 spinal fusion surgery-continue physical therapy. Recommended outpatient physical therapy -Pain management-continue to take the pain medications from home  #3 history of CVA-no residual neurological deficits at this time. Continue aspirin, Plavix and statin.  #4 hypertension-continue home medications. Patient on Norvasc and metoprolol  #5 GERD-on Prilosec  Appears very well and would like to be discharged today  DISCHARGE CONDITIONS:   Guarded CONSULTS OBTAINED:   None  DRUG ALLERGIES:   No Known Allergies DISCHARGE MEDICATIONS:   Allergies as of 06/08/2017  No Known Allergies     Medication List    TAKE these medications   amLODipine 5 MG tablet Commonly known as:  NORVASC Take 1 tablet (5 mg total) by mouth daily.   aspirin EC 81 MG tablet Take 81 mg by mouth daily.   atorvastatin 40 MG tablet Commonly known as:  LIPITOR Take 40 mg by mouth at bedtime.   CALCIUM 600-D 600-400 MG-UNIT Tabs Generic drug:  Calcium Carbonate-Vitamin D3 Take 1 tablet by mouth daily.   carisoprodol 350 MG tablet Commonly known as:  SOMA Take 350 mg by mouth 4 (four) times daily as needed for muscle spasms.   cetirizine 10 MG tablet Commonly known as:  ZYRTEC Take 10  mg by mouth daily.   clopidogrel 75 MG tablet Commonly known as:  PLAVIX Take 75 mg by mouth daily.   clotrimazole-betamethasone cream Commonly known as:  LOTRISONE Apply 1 application topically 2 (two) times daily as needed (rash).   docusate sodium 100 MG capsule Commonly known as:  COLACE Take 100 mg by mouth daily.   donepezil 5 MG tablet Commonly known as:  ARICEPT Take 5 mg by mouth at bedtime.   ferrous sulfate 160 (50 Fe) MG Tbcr SR tablet Commonly known as:  SLOW FE Take 1 tablet by mouth daily.   Fish Oil 1200 MG Caps Take 2 capsules by mouth 2 (two) times daily.   metoprolol succinate 25 MG 24 hr tablet Commonly known as:  TOPROL-XL Take 25 mg by mouth daily.   multivitamin with minerals Tabs tablet Take 1 tablet by mouth 2 (two) times daily.   niacin 500 MG CR tablet Commonly known as:  NIASPAN Take 500 mg by mouth at bedtime.   omeprazole 20 MG capsule Commonly known as:  PRILOSEC Take 20 mg by mouth daily.   oxyCODONE 5 MG immediate release tablet Commonly known as:  Oxy IR/ROXICODONE Take 5 mg by mouth every 4 (four) hours as needed for severe pain.   polyethylene glycol packet Commonly known as:  MIRALAX / GLYCOLAX Take 17 g by mouth daily.   PRESERVISION/LUTEIN Caps Take 1 capsule by mouth 2 (two) times daily.   traMADol-acetaminophen 37.5-325 MG tablet Commonly known as:  ULTRACET Take 2 tablets by mouth every 6 (six) hours as needed for moderate pain.   traMADol-acetaminophen 37.5-325 MG tablet Commonly known as:  ULTRACET Take 1 tablet by mouth 2 (two) times daily.   vitamin B-12 1000 MCG tablet Commonly known as:  CYANOCOBALAMIN Take 1,000 mcg by mouth daily.   zolpidem 10 MG tablet Commonly known as:  AMBIEN Take 10 mg by mouth at bedtime as needed for sleep.        DISCHARGE INSTRUCTIONS:   1. Orthopedics follow-up in 1-2 weeks as prior schedule 2. PCP follow-up in 2-3 weeks  DIET:   Cardiac diet  ACTIVITY:    Activity as tolerated  OXYGEN:   Home Oxygen: No.  Oxygen Delivery: room air  DISCHARGE LOCATION:   home   If you experience worsening of your admission symptoms, develop shortness of breath, life threatening emergency, suicidal or homicidal thoughts you must seek medical attention immediately by calling 911 or calling your MD immediately  if symptoms less severe.  You Must read complete instructions/literature along with all the possible adverse reactions/side effects for all the Medicines you take and that have been prescribed to you. Take any new Medicines after you have completely understood and accpet all the possible adverse reactions/side effects.  Please note  You were cared for by a hospitalist during your hospital stay. If you have any questions about your discharge medications or the care you received while you were in the hospital after you are discharged, you can call the unit and asked to speak with the hospitalist on call if the hospitalist that took care of you is not available. Once you are discharged, your primary care physician will handle any further medical issues. Please note that NO REFILLS for any discharge medications will be authorized once you are discharged, as it is imperative that you return to your primary care physician (or establish a relationship with a primary care physician if you do not have one) for your aftercare needs so that they can reassess your need for medications and monitor your lab values.    On the day of Discharge:  VITAL SIGNS:   Blood pressure 138/68, pulse 68, temperature 99.1 F (37.3 C), temperature source Oral, resp. rate 19, height 5\' 11"  (1.803 m), weight 104.8 kg (231 lb), SpO2 97 %.  PHYSICAL EXAMINATION:    GENERAL:  76 y.o.-year-old Obese patient lying in the bed with no acute distress.  EYES: Pupils equal, round, reactive to light and accommodation. No scleral icterus. Extraocular muscles intact.  HEENT: Head  atraumatic, normocephalic. Oropharynx and nasopharynx clear.  NECK:  Supple, no jugular venous distention. No thyroid enlargement, no tenderness.  LUNGS: Normal breath sounds bilaterally, no wheezing, rales,rhonchi or crepitation. No use of accessory muscles of respiration.  CARDIOVASCULAR: S1, S2 normal. No  rubs, or gallops. 2/6 systolic murmur present ABDOMEN: Soft, nontender, nondistended. Bowel sounds present. No organomegaly or mass.  EXTREMITIES: No pedal edema, cyanosis, or clubbing. Low back pain present NEUROLOGIC: Cranial nerves II through XII are intact. Muscle strength 5/5 in both upper extremities, 5/5 in lower extremities, limited due to back pain but much improved today. Sensation intact. Gait not checked.  PSYCHIATRIC: The patient is alert and oriented x 3.  SKIN: No obvious rash, lesion, or ulcer.   DATA REVIEW:   CBC  Recent Labs Lab 06/06/17 2253  WBC 7.4  HGB 12.9*  HCT 37.6*  PLT 173    Chemistries   Recent Labs Lab 06/06/17 2253  NA 140  K 4.3  CL 107  CO2 23  GLUCOSE 107*  BUN 33*  CREATININE 2.09*  CALCIUM 9.1     Microbiology Results  Results for orders placed or performed during the hospital encounter of 03/26/17  Blood Culture (routine x 2)     Status: None   Collection Time: 03/26/17  5:19 AM  Result Value Ref Range Status   Specimen Description BLOOD LEFT ANTECUBITAL  Final   Special Requests   Final    BOTTLES DRAWN AEROBIC AND ANAEROBIC Blood Culture results may not be optimal due to an excessive volume of blood received in culture bottles   Culture NO GROWTH 5 DAYS  Final   Report Status 03/31/2017 FINAL  Final  Urine culture     Status: Abnormal   Collection Time: 03/26/17  5:19 AM  Result Value Ref Range Status   Specimen Description URINE, RANDOM  Final   Special Requests NONE  Final   Culture MULTIPLE SPECIES PRESENT, SUGGEST RECOLLECTION (A)  Final   Report Status 03/27/2017 FINAL  Final  Blood Culture (routine x 2)      Status: None   Collection Time: 03/26/17  5:20 AM  Result Value Ref Range Status   Specimen Description BLOOD RIGHT ANTECUBITAL  Final   Special Requests   Final    BOTTLES DRAWN AEROBIC AND ANAEROBIC Blood Culture adequate volume   Culture NO GROWTH 5 DAYS  Final   Report Status 03/31/2017 FINAL  Final    RADIOLOGY:  No results found.   Management plans discussed with the patient, family and they are in agreement.  CODE STATUS:     Code Status Orders        Start     Ordered   06/07/17 0433  Full code  Continuous     06/07/17 0432    Code Status History    Date Active Date Inactive Code Status Order ID Comments User Context   03/16/2017  7:30 PM 03/19/2017  5:56 PM Full Code [id number]  Newman Pies, MD Inpatient   01/26/2017  5:31 PM 01/28/2017  3:19 PM Full Code [id number]  Henreitta Leber, MD Inpatient   01/05/2017 11:11 AM 01/06/2017  3:06 PM Full Code [id number]  Algernon Huxley, MD Inpatient   12/27/2016  9:48 AM 12/27/2016  2:52 PM Full Code [id number]  Algernon Huxley, MD Inpatient   07/10/2015  5:08 PM 07/11/2015  1:18 PM Full Code [id number]  Poggi, Marshall Cork, MD Inpatient      TOTAL TIME TAKING CARE OF THIS PATIENT: 37 minutes.    Malon Siddall M.D on 06/08/2017 at 1:08 PM  Between 7am to 6pm - Pager - [phone]  After 6pm go to [email or web address] - Proofreader  [facility]  Office  [phone]  CC: Primary care physician; Idelle Crouch, MD   Note: This dictation was prepared with Dragon dictation along with smaller phrase technology. Any transcriptional errors that result from this process are unintentional.

## 2017-06-15 ENCOUNTER — Ambulatory Visit: Payer: PPO | Attending: Neurosurgery | Admitting: Physical Therapy

## 2017-06-15 ENCOUNTER — Encounter: Payer: Self-pay | Admitting: Physical Therapy

## 2017-06-15 DIAGNOSIS — M545 Low back pain, unspecified: Secondary | ICD-10-CM

## 2017-06-15 DIAGNOSIS — M6281 Muscle weakness (generalized): Secondary | ICD-10-CM | POA: Insufficient documentation

## 2017-06-15 DIAGNOSIS — M62830 Muscle spasm of back: Secondary | ICD-10-CM | POA: Insufficient documentation

## 2017-06-15 DIAGNOSIS — R262 Difficulty in walking, not elsewhere classified: Secondary | ICD-10-CM

## 2017-06-15 NOTE — Therapy (Signed)
[facility] [address] [place], [address] Phone: [phone]   Fax:  [phone]  Physical Therapy Treatment  Patient Details  Name: Rick Mcbride. MRN: [id number] Date of Birth: 1941/01/06 Referring Provider: Ophelia Charter MD  Encounter Date: 06/15/2017      PT End of Session - 06/15/17 1035    Visit Number 14   Number of Visits 16   Date for PT Re-Evaluation 07/13/17   Authorization Type 14   Authorization Time Period 20 (G code)   PT Start Time 1030   PT Stop Time 1115   PT Time Calculation (min) 45 min   Activity Tolerance Patient tolerated treatment well   Behavior During Therapy [facility] for tasks assessed/performed      Past Medical History:  Diagnosis Date  . AAA (abdominal aortic aneurysm) ([place])   . AAA (abdominal aortic aneurysm) without rupture ([place])   . Anemia   . Aneurysm ([place])    abd aortic  . Anxiety   . Atrophic kidney   . Cervical radiculopathy   . Chronic airway obstruction (HCC)    not aware of this  . Chronic kidney disease (CKD), stage III (moderate)    followed by Dr. Johnny Bridge  . Chronic tension headaches   . Coronary artery disease   . Coronary atherosclerosis of autologous vein bypass graft   . DDD (degenerative disc disease), lumbar   . Degenerative disc disease, lumbar    with lumbar radiculopathy  . Dyspnea   . Elbow fracture, left   . GERD (gastroesophageal reflux disease)   . H/O adenomatous polyp of colon   . H/O hemorrhoids   . H/O urticaria   . Headache   . Heart disease   . Hypercholesteremia   . Hyperlipidemia   . 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 Lucky Cowboy  . Lung cancer ([place])   . Lung cancer ([place])   . Meralgia paresthetica   . Meralgia paresthetica   . Neuralgia   . Osteoarthritis   . Osteoarthritis    s/p L knee surgery  . 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 Presence of permanent cardiac pacemaker   . Prostate cancer ([place])   . Second degree AV block    Followed by Dr. Nehemiah Massed  . Sinoatrial node dysfunction (HCC)   . Status post partial lobectomy of lung    bottom right   . Stroke ([place])   . TIA (transient ischemic attack)     Past Surgical History:  Procedure Laterality Date  . CATARACT EXTRACTION    . COLONOSCOPY    . COLONOSCOPY    . COLONOSCOPY WITH PROPOFOL N/A 10/20/2015   Procedure: COLONOSCOPY WITH PROPOFOL;  Surgeon: Manya Silvas, MD;  Location: [facility] ENDOSCOPY;  Service: Endoscopy;  Laterality: N/A;  . CORONARY ARTERY BYPASS GRAFT     triple  . coronary atherosclerosis of autologous vein bypass graft    . EMBOLIZATION Right 12/27/2016   Procedure: Embolization;  Surgeon: Algernon Huxley, MD;  Location: [facility] CV LAB;  Service: Cardiovascular;  Laterality: Right;  . ENDOVASCULAR REPAIR/STENT GRAFT N/A 01/05/2017   Procedure: Endovascular Repair/Stent Graft;  Surgeon: Algernon Huxley, MD;  Location: [facility] CV LAB;  Service: Cardiovascular;  Laterality: N/A;  . EYE SURGERY Bilateral    cataract extraction  . JOINT REPLACEMENT  shoulder and knees  . KNEE ARTHROSCOPY    . LOBECTOMY  01/31/13   RLL w/squamous cell carcinoma lobectomy  . LUNG REMOVAL, PARTIAL  2014   right lower lobe  . PACEMAKER INSERTION    . PACEMAKER INSERTION  12/2012   Dual chanber pacemaker generator  . partial shoulder replacement Right   . POLYPECTOMY    . PROSTATECTOMY    . TOTAL KNEE ARTHROPLASTY Bilateral   . TOTAL SHOULDER ARTHROPLASTY Left 07/10/2015   Procedure: TOTAL SHOULDER ARTHROPLASTY;  Surgeon: [name], MD;  Location: [facility] ORS;  Service: Orthopedics;  Laterality: Left;  . TOTAL SHOULDER REPLACEMENT      There were no vitals filed for this visit.      Subjective Assessment - 06/15/17 1032    Subjective Patient reports he is feeling better today and did have episode of dehydration and was hospitalized 06/07/2017. He is  feeling much better today.    Pertinent History Patient has had back pain for years with multiple episodes of exacerbation of symptoms. He reports that most recent episode began summer 2017 following painting a 6' fence and the pain got progressively worse with weakness in LEs. He then has MRI and then surgery 03/16/2017. He has history of TIA with residueal short term memory diysfunction and bilateral shoulder surgeries, bilateral TKR and aortic anuerysm and pacemaker.    Limitations Sitting;Lifting;Walking;Standing;House hold activities   How long can you sit comfortably? >30 min.   How long can you stand comfortably? no longer than 10 min   How long can you walk comfortably? walking with rolling walker short distances   Diagnostic tests X rays of left UE see report   Patient Stated Goals improve strength, walk without AD, return to prior level of function   Currently in Pain? Yes   Pain Score 2    Pain Location Arm   Pain Orientation Left   Pain Descriptors / Indicators Sore   Pain Type Acute pain  from 05/24/2017    Pain Onset --  03/16/2017   Pain Frequency Intermittent      Objective: Patient arrived in clinic with sling left UE with soft cast/splint with ace wrap in place on left UE, ambulating with SPC with good balance and gait pattern; back brace in place Outcome measures: MODI 40% (was 58%, significant improvement) Strength: left LE hip flexion 4-/5, knee extension 4/5, flexion 4/5, ankle DF 4-/5, right LE ankle DF 5/5, knee extension 5/5, flexion 5/5 hip flexion 4/5   Treatment: Therapeutic exercise: patient performed with VC, tactile cues and demonstration of therapist: Sitting: in chair, with back supported  Knee extension with 3# weights x 15 each LE Knee extension tap balance stones with heel x 25x Knee flexion with red resistive band 2 x 15 reps with assistance Single arm row right with doubled green resistive band x 15 reps Single arm pull down right  with green  resistive band x 20 reps Knee extension ROM with 3# weight on ankles2x 15 Hip abduction with green + redresistive band x 20reps both  Hip flexion x 15 with red resistive band Standing step ups onto 4" step leading 2 x 5 reps with each LE, close supervision, contact guard x 1 for safety, using right UE for support/balance  PF off of balance stones in sitting with 3# weights on ankles; 1 x 15 reps; no weight x 20 reps Resistive DF both feet with red resistive band1 x 20reps with mild fatigue noted left foot  Patient response to treatment: Patient demonstrated good technique with exercises with minimal VC and demonstration. Mild fatigue noted with standing exercises. SPO2/ HR 98%/ 70 bpm at end of session. No increased pain in back with exercises.           PT Education - 06/15/17 1034    Education provided Yes   Education Details instruction in proper technique, exercise, positioning   Person(s) Educated Patient   Methods Explanation;Demonstration;Verbal cues   Comprehension Verbalized understanding;Returned demonstration;Verbal cues required             PT Long Term Goals - 06/15/17 1200      PT LONG TERM GOAL #1   Title  Patient will demonstrate improved function with daily tasks and decreased back pain as indicated by MODI score of 40% or better    Baseline MODI 58%; 06/15/2017 much improved 40%   Status Achieved     PT LONG TERM GOAL #2   Title  Patient will demonstrate improved function with progressing towards prior level of funciton with daily tasks and decreased back pain as indicated by MODI score of 20% or better    Baseline MODI 58%   Status Revised   Target Date 07/13/17     PT LONG TERM GOAL #3   Title Patient will demonstrate improved posture awareness and pain control strategies to allow patient to stand for >30 min.    Baseline unable to stand >15 - 30 min. without increased pain in lower back   Status Revised   Target Date 07/13/17     PT LONG TERM  GOAL #4   Title Patient will be independent with home program for posture awareness, pain control, progressive exercises to allow patient to transition to self management once discharged from physical therapy    Baseline limited knowledge of exercises and progrression without guidance and instruction   Status Revised   Target Date 07/13/17               Plan - 06/15/17 1045    Clinical Impression Statement Patient demonstrates steady improvement with strength in LE's and continues with weakness and decreased endurance for standing and walking activties. He has had complications of fracture left elbow 05/24/17, dehydration with hospitalization 06/07/17 and continues with decreased endurance for standing and daily activities. He will benefit from continued physical therapy intervention to address limitations, strengthen core and LE's in order to improve function with daily tasks and community ambulation.    Rehab Potential Good   Clinical Impairments Affecting Rehab Potential (+) acute condition s/p surgery, motivated, family support (-)co morbidities; shoulder surgeries, TKR bilateral, aortic anuerysm, pacemaker, CA   PT Frequency 2x / week   PT Duration 8 weeks   PT Treatment/Interventions Therapeutic exercise;Moist Heat;Patient/family education;Manual techniques;Gait training   PT Next Visit Plan progressive exercise   PT Home Exercise Plan ROM LE's seated ball rolling under foot, hip adduction, glute sets, core stabilization with resistive bands for scapular row right UE only,       Patient will benefit from skilled therapeutic intervention in order to improve the following deficits and impairments:  Decreased strength, Decreased activity tolerance, Impaired perceived functional ability, Pain, Increased muscle spasms, Decreased endurance, Decreased range of motion, Difficulty walking  Visit Diagnosis: Muscle weakness (generalized) - Plan: PT plan of care cert/re-cert  Difficulty in  walking, not elsewhere classified - Plan: PT plan of care cert/re-cert  Acute low back pain without sciatica, unspecified back pain laterality - Plan: PT plan of  care cert/re-cert     Problem List Patient Active Problem List   Diagnosis Date Noted  . Weakness 06/07/2017  . Spondylolisthesis of lumbosacral region 03/16/2017  . Low back pain 02/01/2017  . Escherichia coli (E. coli) infection 01/28/2017  . Acute on chronic renal failure ([place]) 01/28/2017  . Elevated troponin 01/28/2017  . Generalized weakness 01/28/2017  . Essential hypertension 01/28/2017  . UTI (urinary tract infection) 01/26/2017  . AAA (abdominal aortic aneurysm) without rupture ([place]) 11/23/2016  . Chronic obstructive pulmonary disease ([place]) 10/09/2015  . Personal history of diseases of skin or subcutaneous tissue 10/09/2015  . Malignant neoplasm of prostate ([place]) 10/09/2015  . Pure hypercholesterolemia 10/09/2015  . Sinoatrial node dysfunction ([place]) 10/09/2015  . History of surgical procedure 08/22/2015  . Status post total shoulder replacement 07/10/2015  . Arthritis of shoulder region, degenerative 06/10/2015  . Benign essential HTN 06/03/2015  . Absolute anemia 04/12/2015  . Atrophic kidney 04/12/2015  . Essential (primary) hypertension 04/12/2015  . Acid reflux 04/12/2015  . Cancer of lung ([place]) 04/12/2015  . CA of prostate ([place]) 04/12/2015  . H/O adenomatous polyp of colon 01/15/2015  . Temporary cerebral vascular dysfunction 11/21/2014  . Calcific shoulder tendinitis 08/01/2014  . Cervical nerve root disorder 04/07/2012    [name] PT 06/16/2017, 5:08 PM  [facility] [address] [place], [address] Phone: [phone]   Fax:  [phone]  Name: Rick Mcbride. MRN: [id number] Date of Birth: [birth year]

## 2017-06-21 ENCOUNTER — Ambulatory Visit: Payer: PPO | Admitting: Physical Therapy

## 2017-06-21 ENCOUNTER — Encounter: Payer: Self-pay | Admitting: Physical Therapy

## 2017-06-21 DIAGNOSIS — M6281 Muscle weakness (generalized): Secondary | ICD-10-CM | POA: Diagnosis not present

## 2017-06-21 DIAGNOSIS — R262 Difficulty in walking, not elsewhere classified: Secondary | ICD-10-CM

## 2017-06-21 NOTE — Therapy (Signed)
[facility] [address] [place], [address] Phone: [phone]   Fax:  [phone]  Physical Therapy Treatment  Patient Details  Name: Rick Mcbride. MRN: [id number] Date of Birth: 03-Jan-1941 Referring Provider: Ophelia Charter MD  Encounter Date: 06/21/2017      PT End of Session - 06/21/17 1600    Visit Number 15   Number of Visits 16   Date for PT Re-Evaluation 07/13/17   Authorization Type 15   Authorization Time Period 20 (G code)   PT Start Time 1516   PT Stop Time 1600   PT Time Calculation (min) 44 min   Activity Tolerance Patient tolerated treatment well   Behavior During Therapy [facility] for tasks assessed/performed      Past Medical History:  Diagnosis Date  . AAA (abdominal aortic aneurysm) ([place])   . AAA (abdominal aortic aneurysm) without rupture ([place])   . Anemia   . Aneurysm ([place])    abd aortic  . Anxiety   . Atrophic kidney   . Cervical radiculopathy   . Chronic airway obstruction (HCC)    not aware of this  . Chronic kidney disease (CKD), stage III (moderate)    followed by Dr. Johnny Bridge  . Chronic tension headaches   . Coronary artery disease   . Coronary atherosclerosis of autologous vein bypass graft   . DDD (degenerative disc disease), lumbar   . Degenerative disc disease, lumbar    with lumbar radiculopathy  . Dyspnea   . Elbow fracture, left   . GERD (gastroesophageal reflux disease)   . H/O adenomatous polyp of colon   . H/O hemorrhoids   . H/O urticaria   . Headache   . Heart disease   . Hypercholesteremia   . Hyperlipidemia   . 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 Lucky Cowboy  . Lung cancer ([place])   . Lung cancer ([place])   . Meralgia paresthetica   . Meralgia paresthetica   . Neuralgia   . Osteoarthritis   . Osteoarthritis    s/p L knee surgery  . 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 Presence of permanent cardiac pacemaker   . Prostate cancer ([place])   . Second degree AV block    Followed by Dr. Nehemiah Massed  . Sinoatrial node dysfunction (HCC)   . Status post partial lobectomy of lung    bottom right   . Stroke ([place])   . TIA (transient ischemic attack)     Past Surgical History:  Procedure Laterality Date  . CATARACT EXTRACTION    . COLONOSCOPY    . COLONOSCOPY    . COLONOSCOPY WITH PROPOFOL N/A 10/20/2015   Procedure: COLONOSCOPY WITH PROPOFOL;  Surgeon: Manya Silvas, MD;  Location: [facility] ENDOSCOPY;  Service: Endoscopy;  Laterality: N/A;  . CORONARY ARTERY BYPASS GRAFT     triple  . coronary atherosclerosis of autologous vein bypass graft    . EMBOLIZATION Right 12/27/2016   Procedure: Embolization;  Surgeon: Algernon Huxley, MD;  Location: [facility];  Service: Cardiovascular;  Laterality: Right;  . ENDOVASCULAR REPAIR/STENT GRAFT N/A 01/05/2017   Procedure: Endovascular Repair/Stent Graft;  Surgeon: Algernon Huxley, MD;  Location: [facility];  Service: Cardiovascular;  Laterality: N/A;  . EYE SURGERY Bilateral    cataract extraction  . JOINT REPLACEMENT  shoulder and knees  . KNEE ARTHROSCOPY    . LOBECTOMY  01/31/13   RLL w/squamous cell carcinoma lobectomy  . LUNG REMOVAL, PARTIAL  2014   right lower lobe  . PACEMAKER INSERTION    . PACEMAKER INSERTION  12/2012   Dual chanber pacemaker generator  . partial shoulder replacement Right   . POLYPECTOMY    . PROSTATECTOMY    . TOTAL KNEE ARTHROPLASTY Bilateral   . TOTAL SHOULDER ARTHROPLASTY Left 07/10/2015   Procedure: TOTAL SHOULDER ARTHROPLASTY;  Surgeon: [name], MD;  Location: [facility] ORS;  Service: Orthopedics;  Laterality: Left;  . TOTAL SHOULDER REPLACEMENT      There were no vitals filed for this visit.      Subjective Assessment - 06/21/17 1520    Subjective Patient reports he is still feeling weak and noticed increased fatigue x 2 days and increased soreness and numbness  in both feet. this has resolved as of today    Pertinent History Patient has had back pain for years with multiple episodes of exacerbation of symptoms. He reports that most recent episode began summer 2017 following painting a 6' fence and the pain got progressively worse with weakness in LEs. He then has MRI and then surgery 03/16/2017. He has history of TIA with residueal short term memory diysfunction and bilateral shoulder surgeries, bilateral TKR and aortic anuerysm and pacemaker.    Limitations Sitting;Lifting;Walking;Standing;House hold activities   How long can you sit comfortably? >30 min.   How long can you stand comfortably? no longer than 10 min   How long can you walk comfortably? walking with rolling walker short distances   Diagnostic tests X rays of left UE see report   Patient Stated Goals improve strength, walk without AD, return to prior level of function   Currently in Pain? Yes   Pain Score 2    Pain Location Back   Pain Orientation Left   Pain Descriptors / Indicators Sore   Pain Type Acute pain   Pain Onset More than a month ago  03/16/2017   Pain Frequency Intermittent       Objective: Patient arrived in clinic with sling left UE with soft cast/splint with ace wrap in placeon left UE, ambulating with SPC with good balance and gait pattern; no back brace in use  Treatment: Therapeutic exercise: patient performed with VC, tactile cues and demonstration of therapist: Sitting: in chair, with back supported  Knee extension with 3# weights 2  x 15 each LE Knee flexion with green resistive band 2 x 15 reps with assistance Single arm row right with doubled green resistive band x 15 reps Single arm pull down right with green resistive band x 20 reps Hip abduction with green + redresistive band x 20reps both  Hip flexion x 15 actively Standing step ups onto 4" step leading 2 x 5 reps with each LE, close supervision, contact guard x 1 for safety, using right UE for  support/balance  PF off of balance stones in sitting with 6# weights on thighs; 2 x 15 reps Resistive DF both feet with red + green resistive band 1 x 20reps with mild fatigue noted left foot  Patient response to treatment: Patient demonstrated good technique with exercises with minimal VC and demonstration. Mild fatigue noted with standing exercises. SPO2/ HR 97%/ 68 bpm at end of session. No increased pain in back with exercises.           PT Education - 06/21/17 1528  Education provided Yes   Education Details instruction in proper technique, exercise, postioning   Person(s) Educated Patient   Methods Demonstration;Explanation;Verbal cues   Comprehension Verbalized understanding;Returned demonstration;Verbal cues required             PT Long Term Goals - 06/15/17 1200      PT LONG TERM GOAL #1   Title  Patient will demonstrate improved function with daily tasks and decreased back pain as indicated by MODI score of 40% or better    Baseline MODI 58%; 06/15/2017 much improved 40%   Status Achieved     PT LONG TERM GOAL #2   Title  Patient will demonstrate improved function with progressing towards prior level of funciton with daily tasks and decreased back pain as indicated by MODI score of 20% or better    Baseline MODI 58%   Status Revised   Target Date 07/13/17     PT LONG TERM GOAL #3   Title Patient will demonstrate improved posture awareness and pain control strategies to allow patient to stand for >30 min.    Baseline unable to stand >15 - 30 min. without increased pain in lower back   Status Revised   Target Date 07/13/17     PT LONG TERM GOAL #4   Title Patient will be independent with home program for posture awareness, pain control, progressive exercises to allow patient to transition to self management once discharged from physical therapy    Baseline limited knowledge of exercises and progrression without guidance and instruction   Status Revised    Target Date 07/13/17               Plan - 06/21/17 1600    Clinical Impression Statement Patient demonstrates steady iimprovement with strength  in LE's and conitnues with decresaed endurance as primary limiting factor to full function. He should continue to improve with additional physical therapy intervention.    Rehab Potential Good   Clinical Impairments Affecting Rehab Potential (+) acute condition s/p surgery, motivated, family support (-)co morbidities; shoulder surgeries, TKR bilateral, aortic anuerysm, pacemaker, CA   PT Frequency 2x / week   PT Duration 8 weeks   PT Treatment/Interventions Therapeutic exercise;Moist Heat;Patient/family education;Manual techniques;Gait training   PT Next Visit Plan progressive exercise   PT Home Exercise Plan ROM LE's seated ball rolling under foot, hip adduction, glute sets, core stabilization with resistive bands for scapular row right UE only,       Patient will benefit from skilled therapeutic intervention in order to improve the following deficits and impairments:  Decreased strength, Decreased activity tolerance, Impaired perceived functional ability, Pain, Increased muscle spasms, Decreased endurance, Decreased range of motion, Difficulty walking  Visit Diagnosis: Muscle weakness (generalized)  Difficulty in walking, not elsewhere classified     Problem List Patient Active Problem List   Diagnosis Date Noted  . Weakness 06/07/2017  . Spondylolisthesis of lumbosacral region 03/16/2017  . Low back pain 02/01/2017  . Escherichia coli (E. coli) infection 01/28/2017  . Acute on chronic renal failure ([place]) 01/28/2017  . Elevated troponin 01/28/2017  . Generalized weakness 01/28/2017  . Essential hypertension 01/28/2017  . UTI (urinary tract infection) 01/26/2017  . AAA (abdominal aortic aneurysm) without rupture ([place]) 11/23/2016  . Chronic obstructive pulmonary disease ([place]) 10/09/2015  . Personal history of diseases of skin  or subcutaneous tissue 10/09/2015  . Malignant neoplasm of prostate ([place]) 10/09/2015  . Pure hypercholesterolemia 10/09/2015  . Sinoatrial node dysfunction ([place]) 10/09/2015  .  History of surgical procedure 08/22/2015  . Status post total shoulder replacement 07/10/2015  . Arthritis of shoulder region, degenerative 06/10/2015  . Benign essential HTN 06/03/2015  . Absolute anemia 04/12/2015  . Atrophic kidney 04/12/2015  . Essential (primary) hypertension 04/12/2015  . Acid reflux 04/12/2015  . Cancer of lung ([place]) 04/12/2015  . CA of prostate ([place]) 04/12/2015  . H/O adenomatous polyp of colon 01/15/2015  . Temporary cerebral vascular dysfunction 11/21/2014  . Calcific shoulder tendinitis 08/01/2014  . Cervical nerve root disorder 04/07/2012    [name] PT 06/22/2017, 5:55 PM  [facility] [address] [place], [address] Phone: [phone]   Fax:  [phone]  Name: Rick Mcbride. MRN: [id number] Date of Birth: 05/14/1941

## 2017-06-23 ENCOUNTER — Encounter: Payer: Self-pay | Admitting: Physical Therapy

## 2017-06-23 ENCOUNTER — Ambulatory Visit: Payer: PPO | Admitting: Physical Therapy

## 2017-06-23 DIAGNOSIS — M6281 Muscle weakness (generalized): Secondary | ICD-10-CM

## 2017-06-23 DIAGNOSIS — M545 Low back pain, unspecified: Secondary | ICD-10-CM

## 2017-06-23 DIAGNOSIS — I442 Atrioventricular block, complete: Secondary | ICD-10-CM | POA: Diagnosis not present

## 2017-06-23 DIAGNOSIS — R262 Difficulty in walking, not elsewhere classified: Secondary | ICD-10-CM

## 2017-06-23 NOTE — Therapy (Signed)
[facility] [address], [place], [address] Phone: [phone]   Fax:  [phone]  Physical Therapy Treatment  Patient Details  Name: Rick Mcbride. MRN: [id number] Date of Birth: 1940-11-23 Referring Provider: Ophelia Charter MD  Encounter Date: 06/23/2017      PT End of Session - 06/23/17 1522    Visit Number 16   Number of Visits 28   Date for PT Re-Evaluation 07/13/17   Authorization Type 16   Authorization Time Period 20 (G code)   PT Start Time 0630   PT Stop Time 1503   PT Time Calculation (min) 30 min   Activity Tolerance Patient tolerated treatment well   Behavior During Therapy [facility] for tasks assessed/performed      Past Medical History:  Diagnosis Date  . AAA (abdominal aortic aneurysm) ([place])   . AAA (abdominal aortic aneurysm) without rupture ([place])   . Anemia   . Aneurysm ([place])    abd aortic  . Anxiety   . Atrophic kidney   . Cervical radiculopathy   . Chronic airway obstruction (HCC)    not aware of this  . Chronic kidney disease (CKD), stage III (moderate)    followed by Dr. Johnny Bridge  . Chronic tension headaches   . Coronary artery disease   . Coronary atherosclerosis of autologous vein bypass graft   . DDD (degenerative disc disease), lumbar   . Degenerative disc disease, lumbar    with lumbar radiculopathy  . Dyspnea   . Elbow fracture, left   . GERD (gastroesophageal reflux disease)   . H/O adenomatous polyp of colon   . H/O hemorrhoids   . H/O urticaria   . Headache   . Heart disease   . Hypercholesteremia   . Hyperlipidemia   . 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 Lucky Cowboy  . Lung cancer ([place])   . Lung cancer ([place])   . Meralgia paresthetica   . Meralgia paresthetica   . Neuralgia   . Osteoarthritis   . Osteoarthritis    s/p L knee surgery  . 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 Presence of permanent cardiac pacemaker   . Prostate cancer ([place])   . Second degree AV block    Followed by Dr. Nehemiah Massed  . Sinoatrial node dysfunction (HCC)   . Status post partial lobectomy of lung    bottom right   . Stroke ([place])   . TIA (transient ischemic attack)     Past Surgical History:  Procedure Laterality Date  . CATARACT EXTRACTION    . COLONOSCOPY    . COLONOSCOPY    . COLONOSCOPY WITH PROPOFOL N/A 10/20/2015   Procedure: COLONOSCOPY WITH PROPOFOL;  Surgeon: Manya Silvas, MD;  Location: [facility] ENDOSCOPY;  Service: Endoscopy;  Laterality: N/A;  . CORONARY ARTERY BYPASS GRAFT     triple  . coronary atherosclerosis of autologous vein bypass graft    . EMBOLIZATION Right 12/27/2016   Procedure: Embolization;  Surgeon: Algernon Huxley, MD;  Location: [facility] LAB;  Service: Cardiovascular;  Laterality: Right;  . ENDOVASCULAR REPAIR/STENT GRAFT N/A 01/05/2017   Procedure: Endovascular Repair/Stent Graft;  Surgeon: Algernon Huxley, MD;  Location: [facility];  Service: Cardiovascular;  Laterality: N/A;  . EYE SURGERY Bilateral    cataract extraction  . JOINT REPLACEMENT  shoulder and knees  . KNEE ARTHROSCOPY    . LOBECTOMY  01/31/13   RLL w/squamous cell carcinoma lobectomy  . LUNG REMOVAL, PARTIAL  2014   right lower lobe  . PACEMAKER INSERTION    . PACEMAKER INSERTION  12/2012   Dual chanber pacemaker generator  . partial shoulder replacement Right   . POLYPECTOMY    . PROSTATECTOMY    . TOTAL KNEE ARTHROPLASTY Bilateral   . TOTAL SHOULDER ARTHROPLASTY Left 07/10/2015   Procedure: TOTAL SHOULDER ARTHROPLASTY;  Surgeon: [name], MD;  Location: [facility] ORS;  Service: Orthopedics;  Laterality: Left;  . TOTAL SHOULDER REPLACEMENT      There were no vitals filed for this visit.      Subjective Assessment - 06/23/17 1438    Subjective Patient  reports soreness in left leg following previous session, better today and overall is noticing improving  strength in both legs and back with walking. He is working on building up his endurance.    Pertinent History Patient has had back pain for years with multiple episodes of exacerbation of symptoms. He reports that most recent episode began summer 2017 following painting a 6' fence and the pain got progressively worse with weakness in LEs. He then has MRI and then surgery 03/16/2017. He has history of TIA with residueal short term memory diysfunction and bilateral shoulder surgeries, bilateral TKR and aortic anuerysm and pacemaker.    Limitations Sitting;Lifting;Walking;Standing;House hold activities   How long can you sit comfortably? >30 min.   How long can you stand comfortably? no longer than 10 min   How long can you walk comfortably? walking with rolling walker short distances   Diagnostic tests X rays of left UE see report   Patient Stated Goals improve strength, walk without AD, return to prior level of function   Currently in Pain? No/denies   Pain Onset --  03/16/2017      Objective: Patient arrived in clinic with sling left UE with soft cast/splintwith ace wrap in placeon left UE, ambulating with SPC with good balance and gait pattern; no back brace in use  Treatment: Therapeutic exercise: patient performed with VC, tactile cues and demonstration of therapist: Sitting: in chair, with back supported and airex pad in chair Knee extension with 4# weights 2  x 15 each LE Knee flexion with green resistive band 1 x 15 reps with assistance Single arm row right with doubled greenresistive band 2 x 15 reps Single arm pull down right with green resistive band 2 x 20 reps Hip abduction with green + redresistive band x 20reps both  Hip flexion in standing x 5 reps each LE Side stepping along counter x 2 min.  Standing step ups onto 4" step leading 2 x 5 reps with each LE, close supervision, contact guard x 1 for safety, using right UE for support/balance  PF off of balance stones in  sitting with 6# weights on thighs; 1 x 25 reps Resistive DF both feetwith red+ green resistive band 1 x 20reps    Patient response to treatment: Patient demonstrated good technique with exercises with minimal VC and demonstration. Mild fatigue noted with standing exercises. SPO2/ HR 97%/ 67 bpm at end of session. No increased pain in back with exercises with modification of placing airex pad in chair for knee extension exercises.         PT Education - 06/23/17 1500    Education provided Yes   Education Details Instruction in proper technique,  intensity, alignment.    Person(s) Educated Patient   Methods Explanation;Demonstration;Verbal cues   Comprehension Verbalized understanding;Returned demonstration;Verbal cues required             PT Long Term Goals - 06/15/17 1200      PT LONG TERM GOAL #1   Title  Patient will demonstrate improved function with daily tasks and decreased back pain as indicated by MODI score of 40% or better    Baseline MODI 58%; 06/15/2017 much improved 40%   Status Achieved     PT LONG TERM GOAL #2   Title  Patient will demonstrate improved function with progressing towards prior level of funciton with daily tasks and decreased back pain as indicated by MODI score of 20% or better    Baseline MODI 58%   Status Revised   Target Date 07/13/17     PT LONG TERM GOAL #3   Title Patient will demonstrate improved posture awareness and pain control strategies to allow patient to stand for >30 min.    Baseline unable to stand >15 - 30 min. without increased pain in lower back   Status Revised   Target Date 07/13/17     PT LONG TERM GOAL #4   Title Patient will be independent with home program for posture awareness, pain control, progressive exercises to allow patient to transition to self management once discharged from physical therapy    Baseline limited knowledge of exercises and progrression without guidance and instruction   Status Revised    Target Date 07/13/17               Plan - 06/23/17 1510    Clinical Impression Statement Patient is progressing with goals with improvement noted with strength, endurance during exercises and with daily activities. He continues to require guidance, VC to perform exercises with good technique and will benefit from continued physical therapy intervention.    Rehab Potential Good   Clinical Impairments Affecting Rehab Potential (+) acute condition s/p surgery, motivated, family support (-)co morbidities; shoulder surgeries, TKR bilateral, aortic anuerysm, pacemaker, CA   PT Frequency 2x / week   PT Duration 8 weeks   PT Treatment/Interventions Therapeutic exercise;Moist Heat;Patient/family education;Manual techniques;Gait training   PT Next Visit Plan progressive exercise   PT Home Exercise Plan ROM LE's seated ball rolling under foot, hip adduction, glute sets, core stabilization with resistive bands for scapular row right UE only,       Patient will benefit from skilled therapeutic intervention in order to improve the following deficits and impairments:  Decreased strength, Decreased activity tolerance, Impaired perceived functional ability, Pain, Increased muscle spasms, Decreased endurance, Decreased range of motion, Difficulty walking  Visit Diagnosis: Muscle weakness (generalized)  Difficulty in walking, not elsewhere classified  Acute low back pain without sciatica, unspecified back pain laterality     Problem List Patient Active Problem List   Diagnosis Date Noted  . Weakness 06/07/2017  . Spondylolisthesis of lumbosacral region 03/16/2017  . Low back pain 02/01/2017  . Escherichia coli (E. coli) infection 01/28/2017  . Acute on chronic renal failure ([place]) 01/28/2017  . Elevated troponin 01/28/2017  . Generalized weakness 01/28/2017  . Essential hypertension 01/28/2017  . UTI (urinary tract infection) 01/26/2017  . AAA (abdominal aortic aneurysm) without rupture  ([place]) 11/23/2016  . Chronic obstructive pulmonary disease ([place]) 10/09/2015  . Personal history of diseases of skin or subcutaneous tissue 10/09/2015  . Malignant neoplasm of prostate ([place]) 10/09/2015  . Pure hypercholesterolemia 10/09/2015  .  Sinoatrial node dysfunction ([place]) 10/09/2015  . History of surgical procedure 08/22/2015  . Status post total shoulder replacement 07/10/2015  . Arthritis of shoulder region, degenerative 06/10/2015  . Benign essential HTN 06/03/2015  . Absolute anemia 04/12/2015  . Atrophic kidney 04/12/2015  . Essential (primary) hypertension 04/12/2015  . Acid reflux 04/12/2015  . Cancer of lung ([place]) 04/12/2015  . CA of prostate ([place]) 04/12/2015  . H/O adenomatous polyp of colon 01/15/2015  . Temporary cerebral vascular dysfunction 11/21/2014  . Calcific shoulder tendinitis 08/01/2014  . Cervical nerve root disorder 04/07/2012    Rick Mcbride PT 06/24/2017, 7:32 PM  [facility] [address], [place], [address] Phone: [phone]   Fax:  [phone]  Name: Rick Mcbride. MRN: [id number] Date of Birth: [birth year]

## 2017-06-24 DIAGNOSIS — S52022A Displaced fracture of olecranon process without intraarticular extension of left ulna, initial encounter for closed fracture: Secondary | ICD-10-CM | POA: Diagnosis not present

## 2017-06-28 ENCOUNTER — Ambulatory Visit: Payer: PPO | Admitting: Physical Therapy

## 2017-06-28 ENCOUNTER — Encounter: Payer: Self-pay | Admitting: Physical Therapy

## 2017-06-28 DIAGNOSIS — R262 Difficulty in walking, not elsewhere classified: Secondary | ICD-10-CM

## 2017-06-28 DIAGNOSIS — M545 Low back pain, unspecified: Secondary | ICD-10-CM

## 2017-06-28 DIAGNOSIS — M6281 Muscle weakness (generalized): Secondary | ICD-10-CM

## 2017-06-28 NOTE — Therapy (Signed)
[facility] [address] [place], [address] Phone: [phone]   Fax:  [phone]  Physical Therapy Treatment  Patient Details  Name: Rick Mcbride. MRN: [id number] Date of Birth: [birth year] Referring Provider: Ophelia Charter MD  Encounter Date: 06/28/2017      PT End of Session - 06/28/17 1521    Visit Number 17   Number of Visits 28   Date for PT Re-Evaluation 07/13/17   Authorization Type 17   Authorization Time Period 20 (G code)   PT Start Time 1516   PT Stop Time 1600   PT Time Calculation (min) 44 min   Activity Tolerance Patient tolerated treatment well   Behavior During Therapy [facility] for tasks assessed/performed      Past Medical History:  Diagnosis Date  . AAA (abdominal aortic aneurysm) ([place])   . AAA (abdominal aortic aneurysm) without rupture ([place])   . Anemia   . Aneurysm ([place])    abd aortic  . Anxiety   . Atrophic kidney   . Cervical radiculopathy   . Chronic airway obstruction (HCC)    not aware of this  . Chronic kidney disease (CKD), stage III (moderate)    followed by Dr. Johnny Bridge  . Chronic tension headaches   . Coronary artery disease   . Coronary atherosclerosis of autologous vein bypass graft   . DDD (degenerative disc disease), lumbar   . Degenerative disc disease, lumbar    with lumbar radiculopathy  . Dyspnea   . Elbow fracture, left   . GERD (gastroesophageal reflux disease)   . H/O adenomatous polyp of colon   . H/O hemorrhoids   . H/O urticaria   . Headache   . Heart disease   . Hypercholesteremia   . Hyperlipidemia   . 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 Lucky Cowboy  . Lung cancer ([place])   . Lung cancer ([place])   . Meralgia paresthetica   . Meralgia paresthetica   . Neuralgia   . Osteoarthritis   . Osteoarthritis    s/p L knee surgery  . 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 Presence of permanent cardiac pacemaker   . Prostate cancer ([place])   . Second degree AV block    Followed by Dr. Nehemiah Massed  . Sinoatrial node dysfunction (HCC)   . Status post partial lobectomy of lung    bottom right   . Stroke ([place])   . TIA (transient ischemic attack)     Past Surgical History:  Procedure Laterality Date  . CATARACT EXTRACTION    . COLONOSCOPY    . COLONOSCOPY    . COLONOSCOPY WITH PROPOFOL N/A 10/20/2015   Procedure: COLONOSCOPY WITH PROPOFOL;  Surgeon: Manya Silvas, MD;  Location[facility] ENDOSCOPY;  Service: Endoscopy;  Laterality: N/A;  . CORONARY ARTERY BYPASS GRAFT     triple  . coronary atherosclerosis of autologous vein bypass graft    . EMBOLIZATION Right 12/27/2016   Procedure: Embolization;  Surgeon: Algernon Huxley, MD;  Location: [place] [facility];  Service: Cardiovascular;  Laterality: Right;  . ENDOVASCULAR REPAIR/STENT GRAFT N/A 01/05/2017   Procedure: Endovascular Repair/Stent Graft;  Surgeon: Algernon Huxley, MD;  Location: [place] [facility];  Service: Cardiovascular;  Laterality: N/A;  . EYE SURGERY Bilateral    cataract extraction  . JOINT REPLACEMENT  shoulder and knees  . KNEE ARTHROSCOPY    . LOBECTOMY  01/31/13   RLL w/squamous cell carcinoma lobectomy  . LUNG REMOVAL, PARTIAL  2014   right lower lobe  . PACEMAKER INSERTION    . PACEMAKER INSERTION  12/2012   Dual chanber pacemaker generator  . partial shoulder replacement Right   . POLYPECTOMY    . PROSTATECTOMY    . TOTAL KNEE ARTHROPLASTY Bilateral   . TOTAL SHOULDER ARTHROPLASTY Left 07/10/2015   Procedure: TOTAL SHOULDER ARTHROPLASTY;  Surgeon: [name], MD;  Location: [facility] ORS;  Service: Orthopedics;  Laterality: Left;  . TOTAL SHOULDER REPLACEMENT      There were no vitals filed for this visit.      Subjective Assessment - 06/28/17 1518    Subjective Patient reports soreness in legs and is overall improving.    Pertinent History Patient has had back pain for years  with multiple episodes of exacerbation of symptoms. He reports that most recent episode began summer 2017 following painting a 6' fence and the pain got progressively worse with weakness in LEs. He then has MRI and then surgery 03/16/2017. He has history of TIA with residueal short term memory diysfunction and bilateral shoulder surgeries, bilateral TKR and aortic anuerysm and pacemaker.    Limitations Sitting;Lifting;Walking;Standing;House hold activities   How long can you sit comfortably? >30 min.   How long can you stand comfortably? no longer than 10 min   How long can you walk comfortably? walking with rolling walker short distances   Diagnostic tests X rays of left UE see report   Patient Stated Goals improve strength, walk without AD, return to prior level of function   Currently in Pain? No/denies   Pain Onset --  03/16/2017      Objective: Patient arrived in clinic with hinged braceon left UE, ambulating with SPC with good balance and gait pattern  Treatment: Therapeutic exercise: patient performed with VC, tactile cues and demonstration of therapist: Sitting: in chair, with back supported and airex pad in chair Knee extension with 4# weights 2 x 15 each LE Knee flexion with greenresistive band 2 x 15 reps with assistance Single arm row right with doubled greenresistive band 2 x 15 reps Single arm pull down right with green resistive band 2 x 20 reps Hip abduction with green + redresistive band x 20reps both  Hip flexion in standing with weight shift  x 5 reps each LE with tapping heel on balance beam (airex)  Side stepping along counter x 2 min.  Standing step ups onto 4" step leading 2 x 5 reps with each LE, close supervision, contact guard x 1 for safety, using right UE for support/balance  Standing tap step alternating feet and using right UE for support: 1 min. With VC PF off of balance stones in sitting with 8# weights on thighs; 2 x 15 reps Resistive DF both  feetwith red+ green resistive band 1 x 20reps   Patient response to treatment: Patient demonstrated good technique with exercises with minimal VC and demonstration. Patient demonstrated good technique with exercises with minimal VC and demonstration. Mild pain, fatigue with exercises in standing.         PT Education - 06/28/17 1521    Education provided Yes   Education Details Instruction in proper technique, intensity, alignment   Person(s) Educated Patient   Methods Explanation;Demonstration;Verbal cues   Comprehension Verbalized understanding;Returned demonstration;Verbal cues required  PT Long Term Goals - 06/15/17 1200      PT LONG TERM GOAL #1   Title  Patient will demonstrate improved function with daily tasks and decreased back pain as indicated by MODI score of 40% or better    Baseline MODI 58%; 06/15/2017 much improved 40%   Status Achieved     PT LONG TERM GOAL #2   Title  Patient will demonstrate improved function with progressing towards prior level of funciton with daily tasks and decreased back pain as indicated by MODI score of 20% or better    Baseline MODI 58%   Status Revised   Target Date 07/13/17     PT LONG TERM GOAL #3   Title Patient will demonstrate improved posture awareness and pain control strategies to allow patient to stand for >30 min.    Baseline unable to stand >15 - 30 min. without increased pain in lower back   Status Revised   Target Date 07/13/17     PT LONG TERM GOAL #4   Title Patient will be independent with home program for posture awareness, pain control, progressive exercises to allow patient to transition to self management once discharged from physical therapy    Baseline limited knowledge of exercises and progrression without guidance and instruction   Status Revised   Target Date 07/13/17               Plan - 06/28/17 1522    Clinical Impression Statement Patient is progressing with goals with  improvement noted with strength, endurance during exercises and with daily activties. He continues with weakness in LE's and should continue to improve with additional physical therapy intervention.    Rehab Potential Good   Clinical Impairments Affecting Rehab Potential (+) acute condition s/p surgery, motivated, family support (-)co morbidities; shoulder surgeries, TKR bilateral, aortic anuerysm, pacemaker, CA   PT Frequency 2x / week   PT Duration 8 weeks   PT Treatment/Interventions Therapeutic exercise;Moist Heat;Patient/family education;Manual techniques;Gait training   PT Next Visit Plan progressive exercise   PT Home Exercise Plan ROM LE's seated ball rolling under foot, hip adduction, glute sets, core stabilization with resistive bands for scapular row right UE only,       Patient will benefit from skilled therapeutic intervention in order to improve the following deficits and impairments:  Decreased strength, Decreased activity tolerance, Impaired perceived functional ability, Pain, Increased muscle spasms, Decreased endurance, Decreased range of motion, Difficulty walking  Visit Diagnosis: Muscle weakness (generalized)  Difficulty in walking, not elsewhere classified  Acute low back pain without sciatica, unspecified back pain laterality  Muscle weakness of left upper extremity     Problem List Patient Active Problem List   Diagnosis Date Noted  . Weakness 06/07/2017  . Spondylolisthesis of lumbosacral region 03/16/2017  . Low back pain 02/01/2017  . Escherichia coli (E. coli) infection 01/28/2017  . Acute on chronic renal failure ([place]) 01/28/2017  . Elevated troponin 01/28/2017  . Generalized weakness 01/28/2017  . Essential hypertension 01/28/2017  . UTI (urinary tract infection) 01/26/2017  . AAA (abdominal aortic aneurysm) without rupture ([place]) 11/23/2016  . Chronic obstructive pulmonary disease ([place]) 10/09/2015  . Personal history of diseases of skin or  subcutaneous tissue 10/09/2015  . Malignant neoplasm of prostate ([place]) 10/09/2015  . Pure hypercholesterolemia 10/09/2015  . Sinoatrial node dysfunction ([place]) 10/09/2015  . History of surgical procedure 08/22/2015  . Status post total shoulder replacement 07/10/2015  . Arthritis of shoulder region, degenerative 06/10/2015  .  Benign essential HTN 06/03/2015  . Absolute anemia 04/12/2015  . Atrophic kidney 04/12/2015  . Essential (primary) hypertension 04/12/2015  . Acid reflux 04/12/2015  . Cancer of lung ([place]) 04/12/2015  . CA of prostate ([place]) 04/12/2015  . H/O adenomatous polyp of colon 01/15/2015  . Temporary cerebral vascular dysfunction 11/21/2014  . Calcific shoulder tendinitis 08/01/2014  . Cervical nerve root disorder 04/07/2012    Rick Mcbride PT 06/29/2017, 5:19 PM  [facility] [address] [place], [address] Phone: [phone]   Fax:  [phone]  Name: Rick Mcbride. MRN: [id number] Date of Birth: 09-Oct-1941

## 2017-07-01 DIAGNOSIS — Z8546 Personal history of malignant neoplasm of prostate: Secondary | ICD-10-CM | POA: Diagnosis not present

## 2017-07-05 ENCOUNTER — Encounter: Payer: Self-pay | Admitting: Physical Therapy

## 2017-07-05 ENCOUNTER — Ambulatory Visit: Payer: PPO | Admitting: Physical Therapy

## 2017-07-05 DIAGNOSIS — M545 Low back pain, unspecified: Secondary | ICD-10-CM

## 2017-07-05 DIAGNOSIS — M6281 Muscle weakness (generalized): Secondary | ICD-10-CM | POA: Diagnosis not present

## 2017-07-05 DIAGNOSIS — R262 Difficulty in walking, not elsewhere classified: Secondary | ICD-10-CM

## 2017-07-05 NOTE — Therapy (Signed)
[facility] [address] [place], [address] Phone: [phone]   Fax:  [phone]  Physical Therapy Treatment  Patient Details  Name: Rick Mcbride. MRN: [id number] Date of Birth: [birth year] Referring Provider: Ophelia Charter MD  Encounter Date: 07/05/2017      PT End of Session - 07/05/17 1706    Visit Number 18   Number of Visits 28   Date for PT Re-Evaluation 07/13/17   Authorization Type 18   Authorization Time Period 20 (G code)   PT Start Time 1616   PT Stop Time 1646   PT Time Calculation (min) 30 min   Activity Tolerance Patient tolerated treatment well   Behavior During Therapy [facility] for tasks assessed/performed      Past Medical History:  Diagnosis Date  . AAA (abdominal aortic aneurysm) ([place])   . AAA (abdominal aortic aneurysm) without rupture ([place])   . Anemia   . Aneurysm ([place])    abd aortic  . Anxiety   . Atrophic kidney   . Cervical radiculopathy   . Chronic airway obstruction (HCC)    not aware of this  . Chronic kidney disease (CKD), stage III (moderate)    followed by Dr. Johnny Bridge  . Chronic tension headaches   . Coronary artery disease   . Coronary atherosclerosis of autologous vein bypass graft   . DDD (degenerative disc disease), lumbar   . Degenerative disc disease, lumbar    with lumbar radiculopathy  . Dyspnea   . Elbow fracture, left   . GERD (gastroesophageal reflux disease)   . H/O adenomatous polyp of colon   . H/O hemorrhoids   . H/O urticaria   . Headache   . Heart disease   . Hypercholesteremia   . Hyperlipidemia   . 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 Lucky Cowboy  . Lung cancer ([place])   . Lung cancer ([place])   . Meralgia paresthetica   . Meralgia paresthetica   . Neuralgia   . Osteoarthritis   . Osteoarthritis    s/p L knee surgery  . 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 Presence of permanent cardiac pacemaker   . Prostate cancer ([place])   . Second degree AV block    Followed by Dr. Nehemiah Massed  . Sinoatrial node dysfunction (HCC)   . Status post partial lobectomy of lung    bottom right   . Stroke ([place])   . TIA (transient ischemic attack)     Past Surgical History:  Procedure Laterality Date  . CATARACT EXTRACTION    . COLONOSCOPY    . COLONOSCOPY    . COLONOSCOPY WITH PROPOFOL N/A 10/20/2015   Procedure: COLONOSCOPY WITH PROPOFOL;  Surgeon: Manya Silvas, MD;  Location: [facility] ENDOSCOPY;  Service: Endoscopy;  Laterality: N/A;  . CORONARY ARTERY BYPASS GRAFT     triple  . coronary atherosclerosis of autologous vein bypass graft    . EMBOLIZATION Right 12/27/2016   Procedure: Embolization;  Surgeon: Algernon Huxley, MD;  Location: [facility];  Service: Cardiovascular;  Laterality: Right;  . ENDOVASCULAR REPAIR/STENT GRAFT N/A 01/05/2017   Procedure: Endovascular Repair/Stent Graft;  Surgeon: Algernon Huxley, MD;  Location: [facility];  Service: Cardiovascular;  Laterality: N/A;  . EYE SURGERY Bilateral    cataract extraction  . JOINT REPLACEMENT  shoulder and knees  . KNEE ARTHROSCOPY    . LOBECTOMY  01/31/13   RLL w/squamous cell carcinoma lobectomy  . LUNG REMOVAL, PARTIAL  2014   right lower lobe  . PACEMAKER INSERTION    . PACEMAKER INSERTION  12/2012   Dual chanber pacemaker generator  . partial shoulder replacement Right   . POLYPECTOMY    . PROSTATECTOMY    . TOTAL KNEE ARTHROPLASTY Bilateral   . TOTAL SHOULDER ARTHROPLASTY Left 07/10/2015   Procedure: TOTAL SHOULDER ARTHROPLASTY;  Surgeon: [name], MD;  Location: [facility] ORS;  Service: Orthopedics;  Laterality: Left;  . TOTAL SHOULDER REPLACEMENT      There were no vitals filed for this visit.      Subjective Assessment - 07/05/17 1617    Subjective legs improving with improvment in feeling in feet, less numblike feeling.   Pertinent History Patient has had back  pain for years with multiple episodes of exacerbation of symptoms. He reports that most recent episode began summer 2017 following painting a 6' fence and the pain got progressively worse with weakness in LEs. He then has MRI and then surgery 03/16/2017. He has history of TIA with residueal short term memory diysfunction and bilateral shoulder surgeries, bilateral TKR and aortic anuerysm and pacemaker.    Limitations Sitting;Lifting;Walking;Standing;House hold activities   How long can you sit comfortably? >30 min.   How long can you stand comfortably? no longer than 10 min   How long can you walk comfortably? walking with rolling walker short distances   Diagnostic tests X rays of left UE see report   Patient Stated Goals improve strength, walk without AD, return to prior level of function   Currently in Pain? No/denies   Pain Onset --  03/16/2017        Objective: Patient arrived in clinic with hinged braceon left UE, ambulating with SPC with good balance and gait pattern  Treatment: Therapeutic exercise: patient performed with VC, tactile cues and demonstration of therapist: Sitting: in chair, with back supported and pillow in chairto elevate patient  Knee extension with 3# weights 2 x 15 each LE Knee flexion with greenresistive band 2x 15 reps with assistance Single arm row right with doubled greenresistive band 2x 15 reps Single arm pull down right with green resistive band 2x 20 reps Right elbow extension and flexion x 15 reps with green resistive band and cuing of therapist Hip abduction with green + redresistive band x 20reps both  Hip flexion in standing with weight shift  x 5 reps each LE with tapping heel on balance beam (airex): modified hip flexion on left to perform through short arc to not activate hip hiking  PF off of balance stones in sitting with 6# weights on thighs; 2 x 15 reps Resistive DF both feetwith red+ green resistive band 1 x 20reps   Not  performed today due to patient going to mall to walk following PT session: (Side stepping along counter x 2 min.  Standing step ups onto 4" step leading 2 x 5 reps with each LE, close supervision, contact guard x 1 for safety, using right UE for support/balance; Standing tap step alternating feet and using right UE for support: 1 min. With VC)  Patient response to treatment: Patient performed exercises with mild fatigue at end of repetitions with all exercises. He required cuing to perform with correct technique with standing exercises to recruit the appropriate muscles ie hip flexors not hip hikers.  PT Education - 07/05/17 1705    Education provided Yes   Education Details exercise instruction for correct technique   Person(s) Educated Patient   Methods Explanation;Demonstration;Verbal cues   Comprehension Verbalized understanding;Returned demonstration;Verbal cues required             PT Long Term Goals - 06/15/17 1200      PT LONG TERM GOAL #1   Title  Patient will demonstrate improved function with daily tasks and decreased back pain as indicated by MODI score of 40% or better    Baseline MODI 58%; 06/15/2017 much improved 40%   Status Achieved     PT LONG TERM GOAL #2   Title  Patient will demonstrate improved function with progressing towards prior level of funciton with daily tasks and decreased back pain as indicated by MODI score of 20% or better    Baseline MODI 58%   Status Revised   Target Date 07/13/17     PT LONG TERM GOAL #3   Title Patient will demonstrate improved posture awareness and pain control strategies to allow patient to stand for >30 min.    Baseline unable to stand >15 - 30 min. without increased pain in lower back   Status Revised   Target Date 07/13/17     PT LONG TERM GOAL #4   Title Patient will be independent with home program for posture awareness, pain control, progressive exercises to allow patient to transition to self  management once discharged from physical therapy    Baseline limited knowledge of exercises and progrression without guidance and instruction   Status Revised   Target Date 07/13/17               Plan - 07/05/17 1706    Clinical Impression Statement Patient demonstrates steady improvement with strength in LE's and endurance. limited treatemnt session to strengthening as patient is going to walk in mall following session. He continues to require guidance and cuing to perform exercises with appropriate technique and intensity.   Rehab Potential Good   Clinical Impairments Affecting Rehab Potential (+) acute condition s/p surgery, motivated, family support (-)co morbidities; shoulder surgeries, TKR bilateral, aortic anuerysm, pacemaker, CA   PT Frequency 2x / week   PT Duration 8 weeks   PT Treatment/Interventions Therapeutic exercise;Moist Heat;Patient/family education;Manual techniques;Gait training   PT Next Visit Plan progressive exercise   PT Home Exercise Plan ROM LE's seated ball rolling under foot, hip adduction, glute sets, core stabilization with resistive bands for scapular row right UE only,       Patient will benefit from skilled therapeutic intervention in order to improve the following deficits and impairments:  Decreased strength, Decreased activity tolerance, Impaired perceived functional ability, Pain, Increased muscle spasms, Decreased endurance, Decreased range of motion, Difficulty walking  Visit Diagnosis: Muscle weakness (generalized)  Difficulty in walking, not elsewhere classified  Acute low back pain without sciatica, unspecified back pain laterality     Problem List Patient Active Problem List   Diagnosis Date Noted  . Weakness 06/07/2017  . Spondylolisthesis of lumbosacral region 03/16/2017  . Low back pain 02/01/2017  . Escherichia coli (E. coli) infection 01/28/2017  . Acute on chronic renal failure ([place]) 01/28/2017  . Elevated troponin  01/28/2017  . Generalized weakness 01/28/2017  . Essential hypertension 01/28/2017  . UTI (urinary tract infection) 01/26/2017  . AAA (abdominal aortic aneurysm) without rupture ([place]) 11/23/2016  . Chronic obstructive pulmonary disease ([place]) 10/09/2015  . Personal history of diseases of skin  or subcutaneous tissue 10/09/2015  . Malignant neoplasm of prostate ([place]) 10/09/2015  . Pure hypercholesterolemia 10/09/2015  . Sinoatrial node dysfunction ([place]) 10/09/2015  . History of surgical procedure 08/22/2015  . Status post total shoulder replacement 07/10/2015  . Arthritis of shoulder region, degenerative 06/10/2015  . Benign essential HTN 06/03/2015  . Absolute anemia 04/12/2015  . Atrophic kidney 04/12/2015  . Essential (primary) hypertension 04/12/2015  . Acid reflux 04/12/2015  . Cancer of lung ([place]) 04/12/2015  . CA of prostate ([place]) 04/12/2015  . H/O adenomatous polyp of colon 01/15/2015  . Temporary cerebral vascular dysfunction 11/21/2014  . Calcific shoulder tendinitis 08/01/2014  . Cervical nerve root disorder 04/07/2012    [name] PT 07/05/2017, 5:08 PM  [facility] [address], [place], [address] Phone: [phone]   Fax:  [phone]  Name: Rick Mcbride. MRN: [id number] Date of Birth: 1941/01/22

## 2017-07-06 DIAGNOSIS — N183 Chronic kidney disease, stage 3 (moderate): Secondary | ICD-10-CM | POA: Diagnosis not present

## 2017-07-06 DIAGNOSIS — Z0000 Encounter for general adult medical examination without abnormal findings: Secondary | ICD-10-CM | POA: Diagnosis not present

## 2017-07-06 DIAGNOSIS — E782 Mixed hyperlipidemia: Secondary | ICD-10-CM | POA: Diagnosis not present

## 2017-07-06 DIAGNOSIS — R739 Hyperglycemia, unspecified: Secondary | ICD-10-CM | POA: Diagnosis not present

## 2017-07-06 DIAGNOSIS — C3490 Malignant neoplasm of unspecified part of unspecified bronchus or lung: Secondary | ICD-10-CM | POA: Diagnosis not present

## 2017-07-06 DIAGNOSIS — I10 Essential (primary) hypertension: Secondary | ICD-10-CM | POA: Diagnosis not present

## 2017-07-06 DIAGNOSIS — Z79899 Other long term (current) drug therapy: Secondary | ICD-10-CM | POA: Diagnosis not present

## 2017-07-06 DIAGNOSIS — I714 Abdominal aortic aneurysm, without rupture: Secondary | ICD-10-CM | POA: Diagnosis not present

## 2017-07-07 ENCOUNTER — Encounter: Payer: Self-pay | Admitting: Physical Therapy

## 2017-07-07 ENCOUNTER — Ambulatory Visit: Payer: PPO | Admitting: Physical Therapy

## 2017-07-07 DIAGNOSIS — M6281 Muscle weakness (generalized): Secondary | ICD-10-CM

## 2017-07-07 DIAGNOSIS — M545 Low back pain, unspecified: Secondary | ICD-10-CM

## 2017-07-07 DIAGNOSIS — R262 Difficulty in walking, not elsewhere classified: Secondary | ICD-10-CM

## 2017-07-07 DIAGNOSIS — M62830 Muscle spasm of back: Secondary | ICD-10-CM

## 2017-07-07 NOTE — Therapy (Signed)
[place] [facility] [address], [place], [address] Phone: [phone]   Fax:  [phone]  Physical Therapy Treatment  Patient Details  Name: Rick Mcbride. MRN: [id number] Date of Birth: 1940-12-10 Referring Provider: Ophelia Charter MD  Encounter Date: 07/07/2017      PT End of Session - 07/07/17 1700    Visit Number 19   Number of Visits 28   Date for PT Re-Evaluation 07/13/17   Authorization Type 19   Authorization Time Period 20 (G code)   PT Start Time 1601   PT Stop Time 1645   PT Time Calculation (min) 44 min   Activity Tolerance Patient tolerated treatment well   Behavior During Therapy [facility] for tasks assessed/performed      Past Medical History:  Diagnosis Date  . AAA (abdominal aortic aneurysm) ([place])   . AAA (abdominal aortic aneurysm) without rupture ([place])   . Anemia   . Aneurysm ([place])    abd aortic  . Anxiety   . Atrophic kidney   . Cervical radiculopathy   . Chronic airway obstruction (HCC)    not aware of this  . Chronic kidney disease (CKD), stage III (moderate)    followed by Dr. Johnny Bridge  . Chronic tension headaches   . Coronary artery disease   . Coronary atherosclerosis of autologous vein bypass graft   . DDD (degenerative disc disease), lumbar   . Degenerative disc disease, lumbar    with lumbar radiculopathy  . Dyspnea   . Elbow fracture, left   . GERD (gastroesophageal reflux disease)   . H/O adenomatous polyp of colon   . H/O hemorrhoids   . H/O urticaria   . Headache   . Heart disease   . Hypercholesteremia   . Hyperlipidemia   . 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 Lucky Cowboy  . Lung cancer ([place])   . Lung cancer ([place])   . Meralgia paresthetica   . Meralgia paresthetica   . Neuralgia   . Osteoarthritis   . Osteoarthritis    s/p L knee surgery  . 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 Presence of permanent cardiac pacemaker   . Prostate cancer ([place])   . Second degree AV block    Followed by Dr. Nehemiah Massed  . Sinoatrial node dysfunction (HCC)   . Status post partial lobectomy of lung    bottom right   . Stroke ([place])   . TIA (transient ischemic attack)     Past Surgical History:  Procedure Laterality Date  . CATARACT EXTRACTION    . COLONOSCOPY    . COLONOSCOPY    . COLONOSCOPY WITH PROPOFOL N/A 10/20/2015   Procedure: COLONOSCOPY WITH PROPOFOL;  Surgeon: Manya Silvas, MD;  Location: [facility] ENDOSCOPY;  Service: Endoscopy;  Laterality: N/A;  . CORONARY ARTERY BYPASS GRAFT     triple  . coronary atherosclerosis of autologous vein bypass graft    . EMBOLIZATION Right 12/27/2016   Procedure: Embolization;  Surgeon: Algernon Huxley, MD;  Location: [place] CV [facility];  Service: Cardiovascular;  Laterality: Right;  . ENDOVASCULAR REPAIR/STENT GRAFT N/A 01/05/2017   Procedure: Endovascular Repair/Stent Graft;  Surgeon: Algernon Huxley, MD;  Location: [place];  Service: Cardiovascular;  Laterality: N/A;  . EYE SURGERY Bilateral    cataract extraction  . JOINT REPLACEMENT  shoulder and knees  . KNEE ARTHROSCOPY    . LOBECTOMY  01/31/13   RLL w/squamous cell carcinoma lobectomy  . LUNG REMOVAL, PARTIAL  2014   right lower lobe  . PACEMAKER INSERTION    . PACEMAKER INSERTION  12/2012   Dual chanber pacemaker generator  . partial shoulder replacement Right   . POLYPECTOMY    . PROSTATECTOMY    . TOTAL KNEE ARTHROPLASTY Bilateral   . TOTAL SHOULDER ARTHROPLASTY Left 07/10/2015   Procedure: TOTAL SHOULDER ARTHROPLASTY;  Surgeon: [name], MD;  Location: [facility] ORS;  Service: Orthopedics;  Laterality: Left;  . TOTAL SHOULDER REPLACEMENT      There were no vitals filed for this visit.      Subjective Assessment - 07/07/17 1616    Subjective spasms left side lumbar spine   Pertinent History Patient has had back pain for years with multiple episodes of  exacerbation of symptoms. He reports that most recent episode began summer 2017 following painting a 6' fence and the pain got progressively worse with weakness in LEs. He then has MRI and then surgery 03/16/2017. He has history of TIA with residueal short term memory diysfunction and bilateral shoulder surgeries, bilateral TKR and aortic anuerysm and pacemaker.    Limitations Sitting;Lifting;Walking;Standing;House hold activities   How long can you sit comfortably? >30 min.   How long can you stand comfortably? no longer than 10 min   How long can you walk comfortably? walking with rolling walker short distances   Diagnostic tests X rays of left UE see report   Patient Stated Goals improve strength, walk without AD, return to prior level of function   Currently in Pain? No/denies   Pain Onset --  03/16/2017         Objective: Patient arrived in clinic with hinged braceon left UE, ambulating with SPC with good balance and gait pattern  Treatment: Manual therapy: 10 min. STM performed with superficial techniques to lumbar paraspinal muscles with concentration left lower back with patient seated in chair; goal pain, spams  Therapeutic exercise: patient performed with VC, tactile cues and demonstration of therapist: Sitting: in chair, with back supported and pillow in chairto elevate patient  Knee extension with 4# weights 2 x 15 each LE Knee flexion with greenresistive band 2x 20 reps with assistance Single arm row right with doubled greenresistive band 2x 15 reps Single arm pull down right with green resistive band 2x 20 reps Right elbow extension and flexion x 20 reps with green resistive band and cuing of therapist Hip abduction with green + redresistive band x 20reps both  PF off of balance stones in sitting with 8# weights on thighs; 2x 15 reps Resistive DF both feetwith red+ green resistive band 1 x 25 reps   Side stepping along counter x 2 min.  Standing step ups  onto 7" balance stones  leading x 10 reps with each LE, close supervision, contact guard x 1 for safety, using right UE for support/balance; Standing tap step alternating feet and using right UE for support: x 10 reps With VC)  Patient response to treatment: Patient performed exercises with mild fatigue at end of repetitions with all exercises. He required close supervision for safety with standing exercises             PT Education - 07/07/17 1700    Education provided Yes   Education Details eercise instruction   Person(s) Educated Patient   Methods Explanation;Verbal cues   Comprehension  Verbalized understanding;Verbal cues required             PT Long Term Goals - 06/15/17 1200      PT LONG TERM GOAL #1   Title  Patient will demonstrate improved function with daily tasks and decreased back pain as indicated by MODI score of 40% or better    Baseline MODI 58%; 06/15/2017 much improved 40%   Status Achieved     PT LONG TERM GOAL #2   Title  Patient will demonstrate improved function with progressing towards prior level of funciton with daily tasks and decreased back pain as indicated by MODI score of 20% or better    Baseline MODI 58%   Status Revised   Target Date 07/13/17     PT LONG TERM GOAL #3   Title Patient will demonstrate improved posture awareness and pain control strategies to allow patient to stand for >30 min.    Baseline unable to stand >15 - 30 min. without increased pain in lower back   Status Revised   Target Date 07/13/17     PT LONG TERM GOAL #4   Title Patient will be independent with home program for posture awareness, pain control, progressive exercises to allow patient to transition to self management once discharged from physical therapy    Baseline limited knowledge of exercises and progrression without guidance and instruction   Status Revised   Target Date 07/13/17               Plan - 07/07/17 1700    Clinical Impression  Statement Patient required minimal cuing to perform exercises, improved soft tissue elasticty and decreased pain in lower back following STM. Patient will benefit form additional physical therapy intervention to achieve maximal functional return s/p back surgery.    Rehab Potential Good   Clinical Impairments Affecting Rehab Potential (+) acute condition s/p surgery, motivated, family support (-)co morbidities; shoulder surgeries, TKR bilateral, aortic anuerysm, pacemaker, CA   PT Frequency 2x / week   PT Duration 8 weeks   PT Treatment/Interventions Therapeutic exercise;Moist Heat;Patient/family education;Manual techniques;Gait training   PT Next Visit Plan progressive exercise   PT Home Exercise Plan ROM LE's seated ball rolling under foot, hip adduction, glute sets, core stabilization with resistive bands for scapular row right UE only,       Patient will benefit from skilled therapeutic intervention in order to improve the following deficits and impairments:  Decreased strength, Decreased activity tolerance, Impaired perceived functional ability, Pain, Increased muscle spasms, Decreased endurance, Decreased range of motion, Difficulty walking  Visit Diagnosis: Muscle weakness (generalized)  Difficulty in walking, not elsewhere classified  Acute low back pain without sciatica, unspecified back pain laterality  Muscle spasm of back     Problem List Patient Active Problem List   Diagnosis Date Noted  . Weakness 06/07/2017  . Spondylolisthesis of lumbosacral region 03/16/2017  . Low back pain 02/01/2017  . Escherichia coli (E. coli) infection 01/28/2017  . Acute on chronic renal failure ([place]) 01/28/2017  . Elevated troponin 01/28/2017  . Generalized weakness 01/28/2017  . Essential hypertension 01/28/2017  . UTI (urinary tract infection) 01/26/2017  . AAA (abdominal aortic aneurysm) without rupture ([place]) 11/23/2016  . Chronic obstructive pulmonary disease ([place]) 10/09/2015  .  Personal history of diseases of skin or subcutaneous tissue 10/09/2015  . Malignant neoplasm of prostate ([place]) 10/09/2015  . Pure hypercholesterolemia 10/09/2015  . Sinoatrial node dysfunction ([place]) 10/09/2015  . History of surgical procedure 08/22/2015  .  Status post total shoulder replacement 07/10/2015  . Arthritis of shoulder region, degenerative 06/10/2015  . Benign essential HTN 06/03/2015  . Absolute anemia 04/12/2015  . Atrophic kidney 04/12/2015  . Essential (primary) hypertension 04/12/2015  . Acid reflux 04/12/2015  . Cancer of lung ([place]) 04/12/2015  . CA of prostate ([place]) 04/12/2015  . H/O adenomatous polyp of colon 01/15/2015  . Temporary cerebral vascular dysfunction 11/21/2014  . Calcific shoulder tendinitis 08/01/2014  . Cervical nerve root disorder 04/07/2012    Rick Mcbride PT 07/08/2017, 10:14 AM  [place] [facility] [address], [place], [address] Phone: [phone]   Fax:  [phone]  Name: Rick Mcbride. MRN: [id number] Date of Birth: 1941/05/31

## 2017-07-12 ENCOUNTER — Ambulatory Visit: Payer: PPO | Admitting: Physical Therapy

## 2017-07-14 ENCOUNTER — Ambulatory Visit: Payer: PPO | Attending: Neurosurgery | Admitting: Physical Therapy

## 2017-07-14 ENCOUNTER — Encounter: Payer: Self-pay | Admitting: Physical Therapy

## 2017-07-14 DIAGNOSIS — R262 Difficulty in walking, not elsewhere classified: Secondary | ICD-10-CM | POA: Insufficient documentation

## 2017-07-14 DIAGNOSIS — M6281 Muscle weakness (generalized): Secondary | ICD-10-CM | POA: Diagnosis not present

## 2017-07-14 DIAGNOSIS — M545 Low back pain, unspecified: Secondary | ICD-10-CM

## 2017-07-14 DIAGNOSIS — M62830 Muscle spasm of back: Secondary | ICD-10-CM | POA: Diagnosis not present

## 2017-07-14 NOTE — Therapy (Signed)
[place] [address], [place], [address] Phone: [phone]   Fax:  [phone]  Physical Therapy Treatment  Patient Details  Name: Rick Mcbride. MRN: [id number] Date of Birth: [birth year] Referring Provider: Ophelia Charter MD  Encounter Date: 07/14/2017      PT End of Session - 07/14/17 1543    Visit Number 20   Number of Visits 28   Date for PT Re-Evaluation 08/11/17   Authorization Type 20   Authorization Time Period 20 (G code)   PT Start Time 1534   PT Stop Time 1615   PT Time Calculation (min) 41 min   Activity Tolerance Patient tolerated treatment well   Behavior During Therapy [facility] for tasks assessed/performed      Past Medical History:  Diagnosis Date  . AAA (abdominal aortic aneurysm) ([place])   . AAA (abdominal aortic aneurysm) without rupture ([place])   . Anemia   . Aneurysm ([place])    abd aortic  . Anxiety   . Atrophic kidney   . Cervical radiculopathy   . Chronic airway obstruction (HCC)    not aware of this  . Chronic kidney disease (CKD), stage III (moderate)    followed by Dr. Johnny Bridge  . Chronic tension headaches   . Coronary artery disease   . Coronary atherosclerosis of autologous vein bypass graft   . DDD (degenerative disc disease), lumbar   . Degenerative disc disease, lumbar    with lumbar radiculopathy  . Dyspnea   . Elbow fracture, left   . GERD (gastroesophageal reflux disease)   . H/O adenomatous polyp of colon   . H/O hemorrhoids   . H/O urticaria   . Headache   . Heart disease   . Hypercholesteremia   . Hyperlipidemia   . 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 Lucky Cowboy  . Lung cancer ([place])   . Lung cancer ([place])   . Meralgia paresthetica   . Meralgia paresthetica   . Neuralgia   . Osteoarthritis   . Osteoarthritis    s/p L knee surgery  . 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 Presence of permanent cardiac pacemaker   . Prostate cancer ([place])   . Second degree AV block    Followed by Dr. Nehemiah Massed  . Sinoatrial node dysfunction (HCC)   . Status post partial lobectomy of lung    bottom right   . Stroke ([place])   . TIA (transient ischemic attack)     Past Surgical History:  Procedure Laterality Date  . CATARACT EXTRACTION    . COLONOSCOPY    . COLONOSCOPY    . COLONOSCOPY WITH PROPOFOL N/A 10/20/2015   Procedure: COLONOSCOPY WITH PROPOFOL;  Surgeon: Manya Silvas, MD;  Location: [facility] ENDOSCOPY;  Service: Endoscopy;  Laterality: N/A;  . CORONARY ARTERY BYPASS GRAFT     triple  . coronary atherosclerosis of autologous vein bypass graft    . EMBOLIZATION Right 12/27/2016   Procedure: Embolization;  Surgeon: Algernon Huxley, MD;  Location: [facility];  Service: Cardiovascular;  Laterality: Right;  . ENDOVASCULAR REPAIR/STENT GRAFT N/A 01/05/2017   Procedure: Endovascular Repair/Stent Graft;  Surgeon: Algernon Huxley, MD;  Location: [place] [facility];  Service: Cardiovascular;  Laterality: N/A;  . EYE SURGERY Bilateral    cataract extraction  . JOINT REPLACEMENT  shoulder and knees  . KNEE ARTHROSCOPY    . LOBECTOMY  01/31/13   RLL w/squamous cell carcinoma lobectomy  . LUNG REMOVAL, PARTIAL  2014   right lower lobe  . PACEMAKER INSERTION    . PACEMAKER INSERTION  12/2012   Dual chanber pacemaker generator  . partial shoulder replacement Right   . POLYPECTOMY    . PROSTATECTOMY    . TOTAL KNEE ARTHROPLASTY Bilateral   . TOTAL SHOULDER ARTHROPLASTY Left 07/10/2015   Procedure: TOTAL SHOULDER ARTHROPLASTY;  Surgeon: [name], MD;  Location: [facility] ORS;  Service: Orthopedics;  Laterality: Left;  . TOTAL SHOULDER REPLACEMENT      There were no vitals filed for this visit.      Subjective Assessment - 07/14/17 1539    Subjective pain right side lumbar spine around to right hip   Pertinent History Patient has had back pain for years with  multiple episodes of exacerbation of symptoms. He reports that most recent episode began summer 2017 following painting a 6' fence and the pain got progressively worse with weakness in LEs. He then has MRI and then surgery 03/16/2017. He has history of TIA with residueal short term memory diysfunction and bilateral shoulder surgeries, bilateral TKR and aortic anuerysm and pacemaker.    Limitations Sitting;Lifting;Walking;Standing;House hold activities   How long can you sit comfortably? >30 min.   How long can you stand comfortably? no longer than 10 min   How long can you walk comfortably? walking with rolling walker short distances   Diagnostic tests X rays of left UE see report   Patient Stated Goals improve strength, walk without AD, return to prior level of function   Currently in Pain? Yes   Pain Score 4    Pain Location Back   Pain Orientation Right   Pain Descriptors / Indicators Aching;Sore   Pain Type Acute pain   Pain Onset More than a month ago  03/16/2017   Pain Frequency Intermittent         Objective: Patient is ambulating with and without SPC with slow cadence  Treatment: Manual therapy: 10 min. STM performed with superficial techniques to lumbar paraspinal muscles with concentration right lower back with patient seated in chair; goal pain, spams  Therapeutic exercise: patient performed with VC, tactile cues and demonstration of therapist: Sitting: in chair, with back supported and pillowin chairto elevate patient  Knee extension with 4# weights 2 x 15 each LE Knee flexion with greenresistive band 2x 20 reps with assistance Hip abduction with green + redresistive band x 20reps both  PF off of balance stones in sitting with 13# weights on thighs; 2x 20 reps Resistive DF both feetwith red+ green resistive band 2 x 2o reps  Single arm row right with doubled greenresistive band 2x 15 reps Single arm pull down right with green resistive band 2x 20  reps Right elbow extension and flexion x 20 reps with green resistive band and cuing of therapist  Standing step ups onto 7" balance stones  leading x 10 reps with each LE, close supervision, contact guard x 1 for safety, using right UE for support/balance; Standing tap step alternating feet and using right UE for support: x 10 reps With VC)  Patient response to treatment: Patient demonstrated improved technique with exercises and able to complete all exercises with minimal fatigue. Patient required close supervision for standing exercises         PT Education - 07/14/17 1542    Education  provided Yes   Education Details exercise instruction   Person(s) Educated Patient   Methods Explanation;Verbal cues   Comprehension Verbalized understanding             PT Long Term Goals - 07-17-2017 1620      PT LONG TERM GOAL #1   Title  Patient will demonstrate improved function with daily tasks and decreased back pain as indicated by MODI score of 40% or better    Baseline MODI 58%; 06/15/2017 much improved 40%   Status Achieved     PT LONG TERM GOAL #2   Title  Patient will demonstrate improved function with progressing towards prior level of funciton with daily tasks and decreased back pain as indicated by MODI score of 20% or better    Baseline MODI 58%; 07/13/17 30%   Status Revised   Target Date 08/11/17     PT LONG TERM GOAL #3   Title Patient will demonstrate improved posture awareness and pain control strategies to allow patient to stand for >30 min. and walk for longer distances with less rest periods   Baseline unable to stand >15 - 30 min. without increased pain in lower back   Status Revised   Target Date 08/11/17     PT LONG TERM GOAL #4   Title Patient will be independent with home program for posture awareness, pain control, progressive exercises to allow patient to transition to self management once discharged from physical therapy    Baseline limited knowledge of  exercises and progrression without guidance and instruction   Status Revised   Target Date 08/11/17               Plan - 07/17/2017 1616    Clinical Impression Statement Patient is progressing with strength and endurance with treatment. He continues with decreased endurance and strength in both LE's which limits his function in the community. He will benefit from continued physical therapy intervention to further improve strength and allow transition to home program and self managemnt to continue improvement once discharged from physical therapy.    Rehab Potential Good   Clinical Impairments Affecting Rehab Potential (+) acute condition s/p surgery, motivated, family support (-)co morbidities; shoulder surgeries, TKR bilateral, aortic anuerysm, pacemaker, CA   PT Frequency 1x / week   PT Duration 4 weeks   PT Treatment/Interventions Therapeutic exercise;Moist Heat;Patient/family education;Manual techniques;Gait training   PT Next Visit Plan progressive exercise   PT Home Exercise Plan ROM LE's seated ball rolling under foot, hip adduction, glute sets, core stabilization with resistive bands for scapular row right UE only,    Consulted and Agree with Plan of Care Patient      Patient will benefit from skilled therapeutic intervention in order to improve the following deficits and impairments:  Decreased strength, Decreased activity tolerance, Impaired perceived functional ability, Pain, Increased muscle spasms, Decreased endurance, Decreased range of motion, Difficulty walking  Visit Diagnosis: Acute low back pain without sciatica, unspecified back pain laterality - Plan: PT plan of care cert/re-cert  Difficulty in walking, not elsewhere classified - Plan: PT plan of care cert/re-cert  Muscle weakness (generalized) - Plan: PT plan of care cert/re-cert  Muscle spasm of back - Plan: PT plan of care cert/re-cert       G-Codes - 07/17/17 1700    Functional Assessment Tool Used  (Outpatient Only) MODI, pain, strenth, ROM, clinical judgment   Functional Limitation Mobility: Walking and moving around   Mobility: Walking and Moving Around Current Status (  [id number]) At least 20 percent but less than 40 percent impaired, limited or restricted   Mobility: Walking and Moving Around Goal Status [phone]) At least 1 percent but less than 20 percent impaired, limited or restricted      Problem List Patient Active Problem List   Diagnosis Date Noted  . Weakness 06/07/2017  . Spondylolisthesis of lumbosacral region 03/16/2017  . Low back pain 02/01/2017  . Escherichia coli (E. coli) infection 01/28/2017  . Acute on chronic renal failure ([place]) 01/28/2017  . Elevated troponin 01/28/2017  . Generalized weakness 01/28/2017  . Essential hypertension 01/28/2017  . UTI (urinary tract infection) 01/26/2017  . AAA (abdominal aortic aneurysm) without rupture ([place]) 11/23/2016  . Chronic obstructive pulmonary disease ([place]) 10/09/2015  . Personal history of diseases of skin or subcutaneous tissue 10/09/2015  . Malignant neoplasm of prostate ([place]) 10/09/2015  . Pure hypercholesterolemia 10/09/2015  . Sinoatrial node dysfunction ([place]) 10/09/2015  . History of surgical procedure 08/22/2015  . Status post total shoulder replacement 07/10/2015  . Arthritis of shoulder region, degenerative 06/10/2015  . Benign essential HTN 06/03/2015  . Absolute anemia 04/12/2015  . Atrophic kidney 04/12/2015  . Essential (primary) hypertension 04/12/2015  . Acid reflux 04/12/2015  . Cancer of lung ([place]) 04/12/2015  . CA of prostate ([place]) 04/12/2015  . H/O adenomatous polyp of colon 01/15/2015  . Temporary cerebral vascular dysfunction 11/21/2014  . Calcific shoulder tendinitis 08/01/2014  . Cervical nerve root disorder 04/07/2012    [name] PT 07/15/2017, 9:11 PM  [place] [facility] [address] [place], [address] Phone:  [phone]   Fax:  [phone]  Name: Rick Mcbride. MRN: [id number] Date of Birth: [birth year]

## 2017-07-19 ENCOUNTER — Encounter: Payer: PPO | Admitting: Physical Therapy

## 2017-07-21 ENCOUNTER — Ambulatory Visit: Payer: PPO | Admitting: Physical Therapy

## 2017-07-21 ENCOUNTER — Encounter: Payer: Self-pay | Admitting: Physical Therapy

## 2017-07-21 DIAGNOSIS — M62830 Muscle spasm of back: Secondary | ICD-10-CM

## 2017-07-21 DIAGNOSIS — R262 Difficulty in walking, not elsewhere classified: Secondary | ICD-10-CM

## 2017-07-21 DIAGNOSIS — M545 Low back pain, unspecified: Secondary | ICD-10-CM

## 2017-07-21 DIAGNOSIS — M6281 Muscle weakness (generalized): Secondary | ICD-10-CM

## 2017-07-21 NOTE — Therapy (Signed)
[facility] [address] [place], [address] Phone: [phone]   Fax:  [phone]  Physical Therapy Treatment  Patient Details  Name: Rick Mcbride. MRN: [id number] Date of Birth: 10/30/1941 Referring Provider: Ophelia Charter MD  Encounter Date: 07/21/2017      PT End of Session - 07/21/17 1556    Visit Number 21   Number of Visits 28   Date for PT Re-Evaluation 08/11/17   Authorization Type 21   Authorization Time Period 20 (G code)   PT Start Time 1502   PT Stop Time 1545   PT Time Calculation (min) 43 min   Activity Tolerance Patient tolerated treatment well   Behavior During Therapy [facility] for tasks assessed/performed      Past Medical History:  Diagnosis Date  . AAA (abdominal aortic aneurysm) ([place])   . AAA (abdominal aortic aneurysm) without rupture ([place])   . Anemia   . Aneurysm ([place])    abd aortic  . Anxiety   . Atrophic kidney   . Cervical radiculopathy   . Chronic airway obstruction (HCC)    not aware of this  . Chronic kidney disease (CKD), stage III (moderate)    followed by Dr. Johnny Bridge  . Chronic tension headaches   . Coronary artery disease   . Coronary atherosclerosis of autologous vein bypass graft   . DDD (degenerative disc disease), lumbar   . Degenerative disc disease, lumbar    with lumbar radiculopathy  . Dyspnea   . Elbow fracture, left   . GERD (gastroesophageal reflux disease)   . H/O adenomatous polyp of colon   . H/O hemorrhoids   . H/O urticaria   . Headache   . Heart disease   . Hypercholesteremia   . Hyperlipidemia   . 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 Lucky Cowboy  . Lung cancer ([place])   . Lung cancer ([place])   . Meralgia paresthetica   . Meralgia paresthetica   . Neuralgia   . Osteoarthritis   . Osteoarthritis    s/p L knee surgery  . 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 Presence of permanent cardiac pacemaker   . Prostate cancer ([place])   . Second degree AV block    Followed by Dr. Nehemiah Massed  . Sinoatrial node dysfunction (HCC)   . Status post partial lobectomy of lung    bottom right   . Stroke ([place])   . TIA (transient ischemic attack)     Past Surgical History:  Procedure Laterality Date  . CATARACT EXTRACTION    . COLONOSCOPY    . COLONOSCOPY    . COLONOSCOPY WITH PROPOFOL N/A 10/20/2015   Procedure: COLONOSCOPY WITH PROPOFOL;  Surgeon: Manya Silvas, MD;  Location[facility] ENDOSCOPY;  Service: Endoscopy;  Laterality: N/A;  . CORONARY ARTERY BYPASS GRAFT     triple  . coronary atherosclerosis of autologous vein bypass graft    . EMBOLIZATION Right 12/27/2016   Procedure: Embolization;  Surgeon: Algernon Huxley, MD;  Location: [place];  Service: Cardiovascular;  Laterality: Right;  . ENDOVASCULAR REPAIR/STENT GRAFT N/A 01/05/2017   Procedure: Endovascular Repair/Stent Graft;  Surgeon: Algernon Huxley, MD;  Location: [facility];  Service: Cardiovascular;  Laterality: N/A;  . EYE SURGERY Bilateral    cataract extraction  . JOINT REPLACEMENT  shoulder and knees  . KNEE ARTHROSCOPY    . LOBECTOMY  01/31/13   RLL w/squamous cell carcinoma lobectomy  . LUNG REMOVAL, PARTIAL  2014   right lower lobe  . PACEMAKER INSERTION    . PACEMAKER INSERTION  12/2012   Dual chanber pacemaker generator  . partial shoulder replacement Right   . POLYPECTOMY    . PROSTATECTOMY    . TOTAL KNEE ARTHROPLASTY Bilateral   . TOTAL SHOULDER ARTHROPLASTY Left 07/10/2015   Procedure: TOTAL SHOULDER ARTHROPLASTY;  Surgeon: [name], MD;  Location: [facility] ORS;  Service: Orthopedics;  Laterality: Left;  . TOTAL SHOULDER REPLACEMENT      There were no vitals filed for this visit.      Subjective Assessment - 07/21/17 1512    Subjective Patient reports he is not having right hip pain today and is having mid to upper back soreness   Pertinent History  Patient has had back pain for years with multiple episodes of exacerbation of symptoms. He reports that most recent episode began summer 2017 following painting a 6' fence and the pain got progressively worse with weakness in LEs. He then has MRI and then surgery 03/16/2017. He has history of TIA with residueal short term memory diysfunction and bilateral shoulder surgeries, bilateral TKR and aortic anuerysm and pacemaker.    Limitations Sitting;Lifting;Walking;Standing;House hold activities   How long can you sit comfortably? >30 min.   How long can you stand comfortably? no longer than 10 min   How long can you walk comfortably? walking with rolling walker short distances   Diagnostic tests X rays of left UE see report   Patient Stated Goals improve strength, walk without AD, return to prior level of function   Currently in Pain? Yes   Pain Score 4    Pain Location Back   Pain Orientation Mid;Upper   Pain Descriptors / Indicators Aching;Sore   Pain Type Acute pain   Pain Onset More than a month ago  03/16/2017   Pain Frequency Intermittent         Objective: Patient is ambulating with and without SPC with slow cadence Palpation: increased spasms along right lumbar paraspinal muscles  Treatment: Manual therapy: 10 min. STM performed with superficial techniques to lumbar paraspinal muscles with concentration right lower back with patient seated in chair; goal pain, spams  Therapeutic exercise: patient performed with VC, tactile cues and demonstration of therapist: Sitting: in chair, with back supported and pillowin chairto elevate patient  Knee extension with 3# weights 2 x 15 each LE Knee flexion with greenresistive band 2x 20reps with assistance Hip abduction with green + redresistive band x 20reps both  PF off of balance stones in sitting with 13# weights on thighs; 2x 20 reps Resistive DF both feetwith red+ green resistive band 2 x 20 reps  Single arm row right with  doubled greenresistive band 2x 15 reps Single arm pull down right with green resistive band 2x 20 reps Right elbow extension and flexion x 20reps with green resistive band and cuing of therapist  Standing step ups onto 7" balance stonesleading x 10reps with each LE, close supervision, contact guard x 1 for safety, using right UE for support/balance; Standing tap step alternating feet and using right UE for support: x 10 repsWith VC Walk forward and backwards along counter with close supervision using UE as needed for safety/balance x 1-2 min.  Patient response to treatment: Patient demonstrated improved technique with exercises and able to complete  all exercises with minimal fatigue. Patient required close supervision for standing exercises, improved balance/control with repetition. Improved soft tissue mobility with decreased spasms by 50% following STM.         PT Education - 07/21/17 1556    Education provided Yes   Education Details exercise insruction   Person(s) Educated Patient   Methods Explanation;Verbal cues   Comprehension Verbalized understanding;Verbal cues required             PT Long Term Goals - 07/14/17 1620      PT LONG TERM GOAL #1   Title  Patient will demonstrate improved function with daily tasks and decreased back pain as indicated by MODI score of 40% or better    Baseline MODI 58%; 06/15/2017 much improved 40%   Status Achieved     PT LONG TERM GOAL #2   Title  Patient will demonstrate improved function with progressing towards prior level of funciton with daily tasks and decreased back pain as indicated by MODI score of 20% or better    Baseline MODI 58%; 07/13/17 30%   Status Revised   Target Date 08/11/17     PT LONG TERM GOAL #3   Title Patient will demonstrate improved posture awareness and pain control strategies to allow patient to stand for >30 min. and walk for longer distances with less rest periods   Baseline unable to stand >15 -  30 min. without increased pain in lower back   Status Revised   Target Date 08/11/17     PT LONG TERM GOAL #4   Title Patient will be independent with home program for posture awareness, pain control, progressive exercises to allow patient to transition to self management once discharged from physical therapy    Baseline limited knowledge of exercises and progrression without guidance and instruction   Status Revised   Target Date 08/11/17               Plan - 07/21/17 1557    Clinical Impression Statement Patient is progressing well towards goals with decreasing pain in right hip and decreased spasms in back. Spasms may be due to walking more and having brace on left UE. symptoms should improve when brace is discontinued and gait pattern normalizes.    Rehab Potential Good   Clinical Impairments Affecting Rehab Potential (+) acute condition s/p surgery, motivated, family support (-)co morbidities; shoulder surgeries, TKR bilateral, aortic anuerysm, pacemaker, CA   PT Frequency 1x / week   PT Duration 4 weeks   PT Treatment/Interventions Therapeutic exercise;Moist Heat;Patient/family education;Manual techniques;Gait training   PT Next Visit Plan progressive exercise   PT Home Exercise Plan ROM LE's seated ball rolling under foot, hip adduction, glute sets, core stabilization with resistive bands for scapular row right UE only,       Patient will benefit from skilled therapeutic intervention in order to improve the following deficits and impairments:  Decreased strength, Decreased activity tolerance, Impaired perceived functional ability, Pain, Increased muscle spasms, Decreased endurance, Decreased range of motion, Difficulty walking  Visit Diagnosis: Acute low back pain without sciatica, unspecified back pain laterality  Difficulty in walking, not elsewhere classified  Muscle weakness (generalized)  Muscle spasm of back     Problem List Patient Active Problem List    Diagnosis Date Noted  . Weakness 06/07/2017  . Spondylolisthesis of lumbosacral region 03/16/2017  . Low back pain 02/01/2017  . Escherichia coli (E. coli) infection 01/28/2017  . Acute on chronic renal failure ([place]) 01/28/2017  .  Elevated troponin 01/28/2017  . Generalized weakness 01/28/2017  . Essential hypertension 01/28/2017  . UTI (urinary tract infection) 01/26/2017  . AAA (abdominal aortic aneurysm) without rupture ([place]) 11/23/2016  . Chronic obstructive pulmonary disease ([place]) 10/09/2015  . Personal history of diseases of skin or subcutaneous tissue 10/09/2015  . Malignant neoplasm of prostate ([place]) 10/09/2015  . Pure hypercholesterolemia 10/09/2015  . Sinoatrial node dysfunction ([place]) 10/09/2015  . History of surgical procedure 08/22/2015  . Status post total shoulder replacement 07/10/2015  . Arthritis of shoulder region, degenerative 06/10/2015  . Benign essential HTN 06/03/2015  . Absolute anemia 04/12/2015  . Atrophic kidney 04/12/2015  . Essential (primary) hypertension 04/12/2015  . Acid reflux 04/12/2015  . Cancer of lung ([place]) 04/12/2015  . CA of prostate ([place]) 04/12/2015  . H/O adenomatous polyp of colon 01/15/2015  . Temporary cerebral vascular dysfunction 11/21/2014  . Calcific shoulder tendinitis 08/01/2014  . Cervical nerve root disorder 04/07/2012    Rick Mcbride PT 07/22/2017, 10:13 AM  [facility] [address], [place], [address] Phone: [phone]   Fax:  [phone]  Name: Rick Mcbride. MRN: [id number] Date of Birth: July 09, 1941

## 2017-07-25 DIAGNOSIS — S52022A Displaced fracture of olecranon process without intraarticular extension of left ulna, initial encounter for closed fracture: Secondary | ICD-10-CM | POA: Diagnosis not present

## 2017-07-26 ENCOUNTER — Encounter: Payer: PPO | Admitting: Physical Therapy

## 2017-07-28 ENCOUNTER — Encounter: Payer: Self-pay | Admitting: Physical Therapy

## 2017-07-28 ENCOUNTER — Ambulatory Visit: Payer: PPO | Admitting: Physical Therapy

## 2017-07-28 DIAGNOSIS — M545 Low back pain, unspecified: Secondary | ICD-10-CM

## 2017-07-28 DIAGNOSIS — M6281 Muscle weakness (generalized): Secondary | ICD-10-CM

## 2017-07-28 DIAGNOSIS — R262 Difficulty in walking, not elsewhere classified: Secondary | ICD-10-CM

## 2017-07-29 NOTE — Therapy (Signed)
[facility] [address] [place], [address] Phone: [phone]   Fax:  [phone]  Physical Therapy Treatment  Patient Details  Name: Rick Mcbride. MRN: [id number] Date of Birth: Oct 19, 1941 Referring Provider: Ophelia Charter MD  Encounter Date: 07/28/2017      PT End of Session - 07/28/17 1540    Visit Number 22   Number of Visits 28   Date for PT Re-Evaluation 08/11/17   Authorization Type 22   Authorization Time Period 20 (G code)   PT Start Time 0539   PT Stop Time 1615   PT Time Calculation (min) 40 min   Activity Tolerance Patient tolerated treatment well   Behavior During Therapy [facility] for tasks assessed/performed      Past Medical History:  Diagnosis Date  . AAA (abdominal aortic aneurysm) ([place])   . AAA (abdominal aortic aneurysm) without rupture ([place])   . Anemia   . Aneurysm ([place])    abd aortic  . Anxiety   . Atrophic kidney   . Cervical radiculopathy   . Chronic airway obstruction (HCC)    not aware of this  . Chronic kidney disease (CKD), stage III (moderate)    followed by Dr. Johnny Bridge  . Chronic tension headaches   . Coronary artery disease   . Coronary atherosclerosis of autologous vein bypass graft   . DDD (degenerative disc disease), lumbar   . Degenerative disc disease, lumbar    with lumbar radiculopathy  . Dyspnea   . Elbow fracture, left   . GERD (gastroesophageal reflux disease)   . H/O adenomatous polyp of colon   . H/O hemorrhoids   . H/O urticaria   . Headache   . Heart disease   . Hypercholesteremia   . Hyperlipidemia   . 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 Lucky Cowboy  . Lung cancer ([place])   . Lung cancer ([place])   . Meralgia paresthetica   . Meralgia paresthetica   . Neuralgia   . Osteoarthritis   . Osteoarthritis    s/p L knee surgery  . 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 Presence of permanent cardiac pacemaker   . Prostate cancer ([place])   . Second degree AV block    Followed by Dr. Nehemiah Massed  . Sinoatrial node dysfunction (HCC)   . Status post partial lobectomy of lung    bottom right   . Stroke ([place])   . TIA (transient ischemic attack)     Past Surgical History:  Procedure Laterality Date  . CATARACT EXTRACTION    . COLONOSCOPY    . COLONOSCOPY    . COLONOSCOPY WITH PROPOFOL N/A 10/20/2015   Procedure: COLONOSCOPY WITH PROPOFOL;  Surgeon: Manya Silvas, MD;  Location: [facility] ENDOSCOPY;  Service: Endoscopy;  Laterality: N/A;  . CORONARY ARTERY BYPASS GRAFT     triple  . coronary atherosclerosis of autologous vein bypass graft    . EMBOLIZATION Right 12/27/2016   Procedure: Embolization;  Surgeon: Algernon Huxley, MD;  Location: [place] [facility];  Service: Cardiovascular;  Laterality: Right;  . ENDOVASCULAR REPAIR/STENT GRAFT N/A 01/05/2017   Procedure: Endovascular Repair/Stent Graft;  Surgeon: Algernon Huxley, MD;  Location: [place] [facility];  Service: Cardiovascular;  Laterality: N/A;  . EYE SURGERY Bilateral    cataract extraction  . JOINT REPLACEMENT  shoulder and knees  . KNEE ARTHROSCOPY    . LOBECTOMY  01/31/13   RLL w/squamous cell carcinoma lobectomy  . LUNG REMOVAL, PARTIAL  2014   right lower lobe  . PACEMAKER INSERTION    . PACEMAKER INSERTION  12/2012   Dual chanber pacemaker generator  . partial shoulder replacement Right   . POLYPECTOMY    . PROSTATECTOMY    . TOTAL KNEE ARTHROPLASTY Bilateral   . TOTAL SHOULDER ARTHROPLASTY Left 07/10/2015   Procedure: TOTAL SHOULDER ARTHROPLASTY;  Surgeon: [name], MD;  Location: [facility] ORS;  Service: Orthopedics;  Laterality: Left;  . TOTAL SHOULDER REPLACEMENT      There were no vitals filed for this visit.      Subjective Assessment - 07/28/17 1537    Subjective Patient is progressing well with improved strength and increased endurance with walking and moving about. He is  exercising at [employer] and stores.    Pertinent History Patient has had back pain for years with multiple episodes of exacerbation of symptoms. He reports that most recent episode began summer 2017 following painting a 6' fence and the pain got progressively worse with weakness in LEs. He then has MRI and then surgery 03/16/2017. He has history of TIA with residueal short term memory diysfunction and bilateral shoulder surgeries, bilateral TKR and aortic anuerysm and pacemaker.    Limitations Sitting;Lifting;Walking;Standing;House hold activities   How long can you sit comfortably? >30 min.   How long can you stand comfortably? no longer than 10 min   How long can you walk comfortably? walking with rolling walker short distances   Diagnostic tests X rays of left UE see report   Patient Stated Goals improve strength, walk without AD, return to prior level of function   Currently in Pain? Other (Comment)  mild soreness in back    Pain Onset --  03/16/2017      Objective: Patient is ambulating with and without SPC with slightly increased BOS, carrying SPC, no brace on left UE (removed this week)  Treatment: Therapeutic exercise: patient performed with VC, tactile cues and demonstration of therapist: Sitting: in chair, with back supported and pillowin chairto elevate patient  Knee extension with 5# weights 2 x 15 each LE Knee flexion with greenresistive band 2x 20reps with assistance Hip abduction with green + redresistive band x 20reps   PF off of balance stones in sitting with 20# weights on thighs; 2x 15reps Resistive DF both feetwith red+ green resistive band 2x 20reps  Bilateral scapular rows right with greenresistive band x 15 reps Right elbow extension and flexion x 20reps with green resistive band and cuing of therapist Left elbow extension AROM with hold at end range extension x 5 seconds  Standing tap step alternating feet and using right UE for support: x 10  repsWith VC Walk forward and backwards along counter with close supervision using UE as needed for safety/balance x 1-2 min. Standing hip extension and abduction with 2# weights on ankles 3 x 5 reps with UE support on counter for balance/safety  Patient response to treatment: patient demonstrated improved technique with exercises with minimal VC for correct alignment. He required UE support with standing exercises for safety. Improved motor control with repetition and cuing.             PT Education - 07/28/17 1539    Education provided Yes   Education Details exercise instruction   Person(s) Educated Patient   Methods Explanation;Verbal cues  Comprehension Verbalized understanding;Verbal cues required             PT Long Term Goals - 07/14/17 1620      PT LONG TERM GOAL #1   Title  Patient will demonstrate improved function with daily tasks and decreased back pain as indicated by MODI score of 40% or better    Baseline MODI 58%; 06/15/2017 much improved 40%   Status Achieved     PT LONG TERM GOAL #2   Title  Patient will demonstrate improved function with progressing towards prior level of funciton with daily tasks and decreased back pain as indicated by MODI score of 20% or better    Baseline MODI 58%; 07/13/17 30%   Status Revised   Target Date 08/11/17     PT LONG TERM GOAL #3   Title Patient will demonstrate improved posture awareness and pain control strategies to allow patient to stand for >30 min. and walk for longer distances with less rest periods   Baseline unable to stand >15 - 30 min. without increased pain in lower back   Status Revised   Target Date 08/11/17     PT LONG TERM GOAL #4   Title Patient will be independent with home program for posture awareness, pain control, progressive exercises to allow patient to transition to self management once discharged from physical therapy    Baseline limited knowledge of exercises and progrression without  guidance and instruction   Status Revised   Target Date 08/11/17               Plan - 07/28/17 1541    Clinical Impression Statement Patient demonstrates steady progress towards goals with improvement noted in ROM, strength, endurance. Improved gait pattern demonstrated with decreased use of AD. Patient will benefit from continued physical therapy intervention to address limitations and achieve goals.    Rehab Potential Good   Clinical Impairments Affecting Rehab Potential (+) acute condition s/p surgery, motivated, family support (-)co morbidities; shoulder surgeries, TKR bilateral, aortic anuerysm, pacemaker, CA   PT Frequency 1x / week   PT Duration 4 weeks   PT Treatment/Interventions Therapeutic exercise;Moist Heat;Patient/family education;Manual techniques;Gait training   PT Next Visit Plan progressive exercise   PT Home Exercise Plan ROM LE's seated ball rolling under foot, hip adduction, glute sets, core stabilization with resistive bands for scapular row right UE only,       Patient will benefit from skilled therapeutic intervention in order to improve the following deficits and impairments:  Decreased strength, Decreased activity tolerance, Impaired perceived functional ability, Pain, Increased muscle spasms, Decreased endurance, Decreased range of motion, Difficulty walking  Visit Diagnosis: Acute low back pain without sciatica, unspecified back pain laterality  Difficulty in walking, not elsewhere classified  Muscle weakness (generalized)     Problem List Patient Active Problem List   Diagnosis Date Noted  . Weakness 06/07/2017  . Spondylolisthesis of lumbosacral region 03/16/2017  . Low back pain 02/01/2017  . Escherichia coli (E. coli) infection 01/28/2017  . Acute on chronic renal failure ([place]) 01/28/2017  . Elevated troponin 01/28/2017  . Generalized weakness 01/28/2017  . Essential hypertension 01/28/2017  . UTI (urinary tract infection) 01/26/2017  .  AAA (abdominal aortic aneurysm) without rupture ([place]) 11/23/2016  . Chronic obstructive pulmonary disease ([place]) 10/09/2015  . Personal history of diseases of skin or subcutaneous tissue 10/09/2015  . Malignant neoplasm of prostate ([place]) 10/09/2015  . Pure hypercholesterolemia 10/09/2015  . Sinoatrial node dysfunction ([place]) 10/09/2015  .  History of surgical procedure 08/22/2015  . Status post total shoulder replacement 07/10/2015  . Arthritis of shoulder region, degenerative 06/10/2015  . Benign essential HTN 06/03/2015  . Absolute anemia 04/12/2015  . Atrophic kidney 04/12/2015  . Essential (primary) hypertension 04/12/2015  . Acid reflux 04/12/2015  . Cancer of lung ([place]) 04/12/2015  . CA of prostate ([place]) 04/12/2015  . H/O adenomatous polyp of colon 01/15/2015  . Temporary cerebral vascular dysfunction 11/21/2014  . Calcific shoulder tendinitis 08/01/2014  . Cervical nerve root disorder 04/07/2012    [name] PT 07/29/2017, 10:35 AM  [facility] [address], [place], [address] Phone: [phone]   Fax:  [phone]  Name: Rick Mcbride. MRN: [id number] Date of Birth: Nov 12, 1940

## 2017-08-02 ENCOUNTER — Encounter: Payer: PPO | Admitting: Physical Therapy

## 2017-08-04 ENCOUNTER — Encounter: Payer: Self-pay | Admitting: Physical Therapy

## 2017-08-04 ENCOUNTER — Ambulatory Visit: Payer: PPO | Admitting: Physical Therapy

## 2017-08-04 DIAGNOSIS — M545 Low back pain, unspecified: Secondary | ICD-10-CM

## 2017-08-04 DIAGNOSIS — R262 Difficulty in walking, not elsewhere classified: Secondary | ICD-10-CM

## 2017-08-04 DIAGNOSIS — M6281 Muscle weakness (generalized): Secondary | ICD-10-CM

## 2017-08-04 NOTE — Therapy (Signed)
[facility] [address], [place], [address] Phone: [phone]   Fax:  [phone]  Physical Therapy Treatment  Patient Details  Name: Rick Mcbride. MRN: [id number] Date of Birth: 01-05-1941 Referring Provider: Ophelia Charter MD  Encounter Date: 08/04/2017      PT End of Session - 08/04/17 1538    Visit Number 23   Number of Visits 28   Date for PT Re-Evaluation 08/11/17   Authorization Type 23   Authorization Time Period 20 (G code)   PT Start Time 1526   PT Stop Time 1606   PT Time Calculation (min) 40 min   Activity Tolerance Patient tolerated treatment well   Behavior During Therapy [facility] for tasks assessed/performed      Past Medical History:  Diagnosis Date  . AAA (abdominal aortic aneurysm) ([place])   . AAA (abdominal aortic aneurysm) without rupture ([place])   . Anemia   . Aneurysm ([place])    abd aortic  . Anxiety   . Atrophic kidney   . Cervical radiculopathy   . Chronic airway obstruction (HCC)    not aware of this  . Chronic kidney disease (CKD), stage III (moderate)    followed by Dr. Johnny Bridge  . Chronic tension headaches   . Coronary artery disease   . Coronary atherosclerosis of autologous vein bypass graft   . DDD (degenerative disc disease), lumbar   . Degenerative disc disease, lumbar    with lumbar radiculopathy  . Dyspnea   . Elbow fracture, left   . GERD (gastroesophageal reflux disease)   . H/O adenomatous polyp of colon   . H/O hemorrhoids   . H/O urticaria   . Headache   . Heart disease   . Hypercholesteremia   . Hyperlipidemia   . 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 Lucky Cowboy  . Lung cancer ([place])   . Lung cancer ([place])   . Meralgia paresthetica   . Meralgia paresthetica   . Neuralgia   . Osteoarthritis   . Osteoarthritis    s/p L knee surgery  . 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 Presence of permanent cardiac pacemaker   . Prostate cancer ([place])   . Second degree AV block    Followed by Dr. Nehemiah Massed  . Sinoatrial node dysfunction (HCC)   . Status post partial lobectomy of lung    bottom right   . Stroke ([place])   . TIA (transient ischemic attack)     Past Surgical History:  Procedure Laterality Date  . CATARACT EXTRACTION    . COLONOSCOPY    . COLONOSCOPY    . COLONOSCOPY WITH PROPOFOL N/A 10/20/2015   Procedure: COLONOSCOPY WITH PROPOFOL;  Surgeon: Manya Silvas, MD;  Location: [facility] ENDOSCOPY;  Service: Endoscopy;  Laterality: N/A;  . CORONARY ARTERY BYPASS GRAFT     triple  . coronary atherosclerosis of autologous vein bypass graft    . EMBOLIZATION Right 12/27/2016   Procedure: Embolization;  Surgeon: Algernon Huxley, MD;  Location: [place] CV LAB;  Service: Cardiovascular;  Laterality: Right;  . ENDOVASCULAR REPAIR/STENT GRAFT N/A 01/05/2017   Procedure: Endovascular Repair/Stent Graft;  Surgeon: Algernon Huxley, MD;  Location: [place] CV LAB;  Service: Cardiovascular;  Laterality: N/A;  . EYE SURGERY Bilateral    cataract extraction  . JOINT REPLACEMENT  shoulder and knees  . KNEE ARTHROSCOPY    . LOBECTOMY  01/31/13   RLL w/squamous cell carcinoma lobectomy  . LUNG REMOVAL, PARTIAL  2014   right lower lobe  . PACEMAKER INSERTION    . PACEMAKER INSERTION  12/2012   Dual chanber pacemaker generator  . partial shoulder replacement Right   . POLYPECTOMY    . PROSTATECTOMY    . TOTAL KNEE ARTHROPLASTY Bilateral   . TOTAL SHOULDER ARTHROPLASTY Left 07/10/2015   Procedure: TOTAL SHOULDER ARTHROPLASTY;  Surgeon: [name], MD;  Location: [facility] ORS;  Service: Orthopedics;  Laterality: Left;  . TOTAL SHOULDER REPLACEMENT      There were no vitals filed for this visit.      Subjective Assessment - 08/04/17 1527    Subjective Patient is progressing well with improved strength and using cane only outside the home for safety.    Pertinent  History Patient has had back pain for years with multiple episodes of exacerbation of symptoms. He reports that most recent episode began summer 2017 following painting a 6' fence and the pain got progressively worse with weakness in LEs. He then has MRI and then surgery 03/16/2017. He has history of TIA with residueal short term memory diysfunction and bilateral shoulder surgeries, bilateral TKR and aortic anuerysm and pacemaker.    Limitations Sitting;Lifting;Walking;Standing;House hold activities   How long can you sit comfortably? >30 min.   How long can you stand comfortably? no longer than 10 min   How long can you walk comfortably? walking with rolling walker short distances   Diagnostic tests X rays of left UE see report   Patient Stated Goals improve strength, walk without AD, return to prior level of function   Pain Onset --  03/16/2017        Objective: Patient is ambulating with and without SPC with slightly increased BOS, carrying SPC, good cadence  Treatment: Therapeutic exercise: patient performed with VC, tactile cues and demonstration of therapist: Sitting: in chair, with back supported and pillowin chairto elevate patient  Knee extension with 5# weights 2 x 15 each LE Knee flexion with greenresistive band 2x 20reps with assistance Hip abduction with green + redresistive band x 20reps   PF off of balance stones in sitting with 20# weights on thighs; 2x 15reps Resistive DF both feetwith red+ green resistive band 2x 20reps  Bilateral scapular rows right with greenresistive band x 15 reps Right elbow extension and flexion x 20reps with green resistive band; left UE with red resistive band and cuing of therapist Single arm row green resistive band 2 x 15 reps each UE  Walking with 3# dumbbells x 2 min. Walk side stepping along balance beam airex x 2 min.  Patient response to treatment: patient demonstrated improved technique with exercises with minimal  VC for correct alignment. He required UE support with standing exercises for safety. Improved motor control with repetition and cuing.         PT Education - 08/04/17 1535    Education provided Yes   Education Details exercise instruction   Person(s) Educated Patient   Methods Explanation;Verbal cues   Comprehension Verbalized understanding;Verbal cues required             PT Long Term Goals - 07/14/17 1620      PT LONG TERM GOAL #1   Title  Patient will demonstrate improved function with daily tasks and decreased back pain as indicated by MODI score of 40% or better  Baseline MODI 58%; 06/15/2017 much improved 40%   Status Achieved     PT LONG TERM GOAL #2   Title  Patient will demonstrate improved function with progressing towards prior level of funciton with daily tasks and decreased back pain as indicated by MODI score of 20% or better    Baseline MODI 58%; 07/13/17 30%   Status Revised   Target Date 08/11/17     PT LONG TERM GOAL #3   Title Patient will demonstrate improved posture awareness and pain control strategies to allow patient to stand for >30 min. and walk for longer distances with less rest periods   Baseline unable to stand >15 - 30 min. without increased pain in lower back   Status Revised   Target Date 08/11/17     PT LONG TERM GOAL #4   Title Patient will be independent with home program for posture awareness, pain control, progressive exercises to allow patient to transition to self management once discharged from physical therapy    Baseline limited knowledge of exercises and progrression without guidance and instruction   Status Revised   Target Date 08/11/17               Plan - 08/04/17 1630    Clinical Impression Statement Patient demonstrates steady improvement with goals with improvement in strength and endurance. He continues with limitations with prolonged standing and walking activities and will require continued physical therapy  intervention to achieve goals.   Rehab Potential Good   Clinical Impairments Affecting Rehab Potential (+) acute condition s/p surgery, motivated, family support (-)co morbidities; shoulder surgeries, TKR bilateral, aortic anuerysm, pacemaker, CA   PT Frequency 1x / week   PT Duration 4 weeks   PT Treatment/Interventions Therapeutic exercise;Moist Heat;Patient/family education;Manual techniques;Gait training   PT Next Visit Plan progressive exercise   PT Home Exercise Plan ROM LE's seated ball rolling under foot, hip adduction, glute sets, core stabilization with resistive bands for scapular row right UE only,       Patient will benefit from skilled therapeutic intervention in order to improve the following deficits and impairments:  Decreased strength, Decreased activity tolerance, Impaired perceived functional ability, Pain, Increased muscle spasms, Decreased endurance, Decreased range of motion, Difficulty walking  Visit Diagnosis: Acute low back pain without sciatica, unspecified back pain laterality  Difficulty in walking, not elsewhere classified  Muscle weakness (generalized)     Problem List Patient Active Problem List   Diagnosis Date Noted  . Weakness 06/07/2017  . Spondylolisthesis of lumbosacral region 03/16/2017  . Low back pain 02/01/2017  . Escherichia coli (E. coli) infection 01/28/2017  . Acute on chronic renal failure ([place]) 01/28/2017  . Elevated troponin 01/28/2017  . Generalized weakness 01/28/2017  . Essential hypertension 01/28/2017  . UTI (urinary tract infection) 01/26/2017  . AAA (abdominal aortic aneurysm) without rupture ([place]) 11/23/2016  . Chronic obstructive pulmonary disease ([place]) 10/09/2015  . Personal history of diseases of skin or subcutaneous tissue 10/09/2015  . Malignant neoplasm of prostate ([place]) 10/09/2015  . Pure hypercholesterolemia 10/09/2015  . Sinoatrial node dysfunction ([place]) 10/09/2015  . History of surgical procedure 08/22/2015   . Status post total shoulder replacement 07/10/2015  . Arthritis of shoulder region, degenerative 06/10/2015  . Benign essential HTN 06/03/2015  . Absolute anemia 04/12/2015  . Atrophic kidney 04/12/2015  . Essential (primary) hypertension 04/12/2015  . Acid reflux 04/12/2015  . Cancer of lung ([place]) 04/12/2015  . CA of prostate ([place]) 04/12/2015  . H/O adenomatous polyp  of colon 01/15/2015  . Temporary cerebral vascular dysfunction 11/21/2014  . Calcific shoulder tendinitis 08/01/2014  . Cervical nerve root disorder 04/07/2012    [name] PT 08/05/2017, 8:36 PM  [facility] [address] [place], [address] Phone: [phone]   Fax:  [phone]  Name: Rick Mcbride. MRN: [id number] Date of Birth: Feb 19, 1941

## 2017-08-09 ENCOUNTER — Encounter: Payer: PPO | Admitting: Physical Therapy

## 2017-08-09 DIAGNOSIS — N183 Chronic kidney disease, stage 3 (moderate): Secondary | ICD-10-CM | POA: Diagnosis not present

## 2017-08-09 DIAGNOSIS — I129 Hypertensive chronic kidney disease with stage 1 through stage 4 chronic kidney disease, or unspecified chronic kidney disease: Secondary | ICD-10-CM | POA: Diagnosis not present

## 2017-08-09 DIAGNOSIS — R809 Proteinuria, unspecified: Secondary | ICD-10-CM | POA: Diagnosis not present

## 2017-08-11 ENCOUNTER — Encounter: Payer: Self-pay | Admitting: Physical Therapy

## 2017-08-11 ENCOUNTER — Ambulatory Visit: Payer: PPO | Attending: Neurosurgery | Admitting: Physical Therapy

## 2017-08-11 DIAGNOSIS — M545 Low back pain, unspecified: Secondary | ICD-10-CM

## 2017-08-11 DIAGNOSIS — M6281 Muscle weakness (generalized): Secondary | ICD-10-CM

## 2017-08-11 DIAGNOSIS — R262 Difficulty in walking, not elsewhere classified: Secondary | ICD-10-CM | POA: Diagnosis not present

## 2017-08-11 NOTE — Therapy (Signed)
[facility] [address], [place], [address] Phone: [phone]   Fax:  [phone]  Physical Therapy Treatment/Discharge Summary  Patient Details  Name: Rick Mcbride. MRN: [id number] Date of Birth: 1941-05-17 Referring Provider: Ophelia Charter MD  Encounter Date: 08/11/2017   Patient began physical therapy on 04/12/2017 and has attended 24 sessions through 08/11/2017. He has achieved goals and is independent in home program for continued self management of pain/symptoms and exercises as instructed. Plan discharge from physical therapy at this time.        PT End of Session - 08/11/17 1544    Visit Number 24   Number of Visits 28   Date for PT Re-Evaluation 08/11/17   Authorization Type 24   Authorization Time Period 20 (G code)   PT Start Time 1530   PT Stop Time 1618   PT Time Calculation (min) 48 min   Activity Tolerance Patient tolerated treatment well   Behavior During Therapy WFL for tasks assessed/performed      Past Medical History:  Diagnosis Date  . AAA (abdominal aortic aneurysm) ([place])   . AAA (abdominal aortic aneurysm) without rupture ([place])   . Anemia   . Aneurysm ([place])    abd aortic  . Anxiety   . Atrophic kidney   . Cervical radiculopathy   . Chronic airway obstruction (HCC)    not aware of this  . Chronic kidney disease (CKD), stage III (moderate) (HCC)    followed by Dr. Johnny Bridge  . Chronic tension headaches   . Coronary artery disease   . Coronary atherosclerosis of autologous vein bypass graft   . DDD (degenerative disc disease), lumbar   . Degenerative disc disease, lumbar    with lumbar radiculopathy  . Dyspnea   . Elbow fracture, left   . GERD (gastroesophageal reflux disease)   . H/O adenomatous polyp of colon   . H/O hemorrhoids   . H/O urticaria   . Headache   . Heart disease   . Hypercholesteremia   . Hyperlipidemia   . 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 Lucky Cowboy  . Lung cancer ([place])   . Lung cancer ([place])   . Meralgia paresthetica   . Meralgia paresthetica   . Neuralgia   . Osteoarthritis   . Osteoarthritis    s/p L knee surgery  . 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 Presence of permanent cardiac pacemaker   . Prostate cancer ([place])   . Second degree AV block    Followed by Dr. Nehemiah Massed  . Sinoatrial node dysfunction (HCC)   . Status post partial lobectomy of lung    bottom right   . Stroke ([place])   . TIA (transient ischemic attack)     Past Surgical History:  Procedure Laterality Date  . CATARACT EXTRACTION    . COLONOSCOPY    . COLONOSCOPY    . COLONOSCOPY WITH PROPOFOL N/A 10/20/2015   Procedure: COLONOSCOPY WITH PROPOFOL;  Surgeon: Manya Silvas, MD;  Location: [facility] ENDOSCOPY;  Service: Endoscopy;  Laterality: N/A;  . CORONARY ARTERY BYPASS GRAFT     triple  . coronary atherosclerosis of autologous vein bypass graft    . EMBOLIZATION Right 12/27/2016   Procedure: Embolization;  Surgeon: Algernon Huxley, MD;  Location: [facility] LAB;  Service: Cardiovascular;  Laterality: Right;  . ENDOVASCULAR REPAIR/STENT GRAFT N/A 01/05/2017   Procedure: Endovascular Repair/Stent Graft;  Surgeon: Algernon Huxley, MD;  Location: [facility] LAB;  Service: Cardiovascular;  Laterality: N/A;  . EYE SURGERY Bilateral    cataract extraction  . JOINT REPLACEMENT     shoulder and knees  . KNEE ARTHROSCOPY    . LOBECTOMY  01/31/13   RLL w/squamous cell carcinoma lobectomy  . LUNG REMOVAL, PARTIAL  2014   right lower lobe  . PACEMAKER INSERTION    . PACEMAKER INSERTION  12/2012   Dual chanber pacemaker generator  . partial shoulder replacement Right   . POLYPECTOMY    . PROSTATECTOMY    . TOTAL KNEE ARTHROPLASTY Bilateral   . TOTAL SHOULDER ARTHROPLASTY Left 07/10/2015   Procedure: TOTAL SHOULDER ARTHROPLASTY;  Surgeon: [name], MD;  Location:  [facility] ORS;  Service: Orthopedics;  Laterality: Left;  . TOTAL SHOULDER REPLACEMENT      There were no vitals filed for this visit.      Subjective Assessment - 08/11/17 1539    Subjective Patient is progressing well with improved strength and using cane only outside the home for safety. He reports he is ready to continue indpendent home program and eager to return to gym and be able to walk in thr community.    Pertinent History Patient has had back pain for years with multiple episodes of exacerbation of symptoms. He reports that most recent episode began summer 2017 following painting a 6' fence and the pain got progressively worse with weakness in LEs. He then has MRI and then surgery 03/16/2017. He has history of TIA with residueal short term memory diysfunction and bilateral shoulder surgeries, bilateral TKR and aortic anuerysm and pacemaker.    Limitations Sitting;Lifting;Walking;Standing;House hold activities   How long can you sit comfortably? >30 min.   How long can you stand comfortably? no longer than 10 min   How long can you walk comfortably? walking with rolling walker short distances   Diagnostic tests X rays of left UE see report   Patient Stated Goals improve strength, walk without AD, return to prior level of function   Currently in Pain? Other (Comment)  lower back soreness and bilateral feet numbness    Pain Onset --  03/16/2017       Objective: Patient is ambulating with and without SPC with slightly increased BOS, carrying SPC, good cadence Outcome measures: 10Mw 1 m/s; TUG 13.4 seconds (both WFL for low fall risk and functional community ambulation) MODI 18% (significant improvement) Strength: both LE's hip flexion, knee extension, flexion, ankle DF at least 4/5, PF at least 4-/5  Treatment: Therapeutic exercise: patient performed with VC, tactile cues and demonstration of therapist: Sitting: in chair, with back supported and pillowin chairto elevate patient   Knee flexion with greenresistive band 2x 20reps with assistance Hip abduction with green + redresistive band x 20reps  Resistive DF both feetwith red+ green resistive band 2x 20reps  Bilateral scapular rowsright with greenresistive band x 15 reps Single arm row green resistive band 2 x 15 reps each UE  Walking with 3# dumbbells x 2 min. Walk side stepping along balance beam airex x 2 min.  10MW 10 seconds TUG 13.44 seconds  Re assessed home exercises with handout  Patient response to treatment: Patient demonstrated improved technique with all exercises with minimal to no cuing. Patient verbalized good understanding of home exercises. patient demonstrated improved technique with exercises with minimal VC for  correct alignment.         PT Education - 08/22/17 1542    Education provided Yes   Education Details home exercises   Person(s) Educated Patient   Methods Explanation;Demonstration;Verbal cues;Handout   Comprehension Verbalized understanding;Returned demonstration;Verbal cues required             PT Long Term Goals - 2017-08-22 1630      PT LONG TERM GOAL #1   Title  Patient will demonstrate improved function with daily tasks and decreased back pain as indicated by MODI score of 40% or better    Baseline MODI 58%; 06/15/2017 much improved 40%   Status Achieved     PT LONG TERM GOAL #2   Title  Patient will demonstrate improved function with progressing towards prior level of funciton with daily tasks and decreased back pain as indicated by MODI score of 20% or better    Baseline MODI 58%; 07/13/17 30%; 08/22/17 18%   Status Achieved     PT LONG TERM GOAL #3   Title Patient will demonstrate improved posture awareness and pain control strategies to allow patient to stand for >30 min. and walk for longer distances with less rest periods   Baseline unable to stand >15 - 30 min. without increased pain in lower back; able to walk for 30 for exercise with  minimal rest periods   Status Achieved     PT LONG TERM GOAL #4   Title Patient will be independent with home program for posture awareness, pain control, progressive exercises to allow patient to transition to self management once discharged from physical therapy    Baseline limited knowledge of exercises and progrression without guidance and instruction   Status Achieved               Plan - 08-22-17 1631    Clinical Impression Statement Patient has achieved goals and verbalizes good understanding of home exercises and safety awareness with walking in and outside the home. He continues to heal from back surgery and left elbow fracture and should continue to progress with self management and support of spouse.    Rehab Potential Good   Clinical Impairments Affecting Rehab Potential (+) acute condition s/p surgery, motivated, family support (-)co morbidities; shoulder surgeries, TKR bilateral, aortic anuerysm, pacemaker, CA   PT Frequency 1x / week   PT Duration 4 weeks   PT Treatment/Interventions Therapeutic exercise;Moist Heat;Patient/family education;Manual techniques;Gait training   PT Next Visit Plan discharge    PT Home Exercise Plan ROM LE's seated ball rolling under foot, hip adduction, glute sets, core stabilization with resistive bands for scapular row right UE only,    Consulted and Agree with Plan of Care Patient      Patient will benefit from skilled therapeutic intervention in order to improve the following deficits and impairments:  Decreased strength, Decreased activity tolerance, Impaired perceived functional ability, Pain, Increased muscle spasms, Decreased endurance, Decreased range of motion, Difficulty walking  Visit Diagnosis: Acute low back pain without sciatica, unspecified back pain laterality  Difficulty in walking, not elsewhere classified  Muscle weakness (generalized)       G-Codes - 2017-08-22 1620    Functional Assessment Tool Used (Outpatient  Only) MODI, pain, strenth, ROM, 10MW, TUG, clinical judgment   Functional Limitation Mobility: Walking and moving around   Mobility: Walking and Moving Around Goal Status [phone]) At least 1 percent but less than 20 percent impaired, limited or restricted   Mobility: Walking and Moving Around Discharge  Status [phone]) At least 1 percent but less than 20 percent impaired, limited or restricted      Problem List Patient Active Problem List   Diagnosis Date Noted  . Weakness 06/07/2017  . Spondylolisthesis of lumbosacral region 03/16/2017  . Low back pain 02/01/2017  . Escherichia coli (E. coli) infection 01/28/2017  . Acute on chronic renal failure ([place]) 01/28/2017  . Elevated troponin 01/28/2017  . Generalized weakness 01/28/2017  . Essential hypertension 01/28/2017  . UTI (urinary tract infection) 01/26/2017  . AAA (abdominal aortic aneurysm) without rupture ([place]) 11/23/2016  . Chronic obstructive pulmonary disease ([place]) 10/09/2015  . Personal history of diseases of skin or subcutaneous tissue 10/09/2015  . Malignant neoplasm of prostate ([place]) 10/09/2015  . Pure hypercholesterolemia 10/09/2015  . Sinoatrial node dysfunction ([place]) 10/09/2015  . History of surgical procedure 08/22/2015  . Status post total shoulder replacement 07/10/2015  . Arthritis of shoulder region, degenerative 06/10/2015  . Benign essential HTN 06/03/2015  . Absolute anemia 04/12/2015  . Atrophic kidney 04/12/2015  . Essential (primary) hypertension 04/12/2015  . Acid reflux 04/12/2015  . Cancer of lung ([place]) 04/12/2015  . CA of prostate ([place]) 04/12/2015  . H/O adenomatous polyp of colon 01/15/2015  . Temporary cerebral vascular dysfunction 11/21/2014  . Calcific shoulder tendinitis 08/01/2014  . Cervical nerve root disorder 04/07/2012    [name] PT 08/12/2017, 7:59 PM  [facility] [address], [place], [address] Phone:  [phone]   Fax:  [phone]  Name: Urbano Milhouse. MRN: [id number] Date of Birth: 1941/07/19

## 2017-08-16 ENCOUNTER — Encounter: Payer: PPO | Admitting: Physical Therapy

## 2017-08-16 DIAGNOSIS — R030 Elevated blood-pressure reading, without diagnosis of hypertension: Secondary | ICD-10-CM | POA: Diagnosis not present

## 2017-08-16 DIAGNOSIS — Z6832 Body mass index (BMI) 32.0-32.9, adult: Secondary | ICD-10-CM | POA: Diagnosis not present

## 2017-08-16 DIAGNOSIS — M4317 Spondylolisthesis, lumbosacral region: Secondary | ICD-10-CM | POA: Diagnosis not present

## 2017-08-18 ENCOUNTER — Ambulatory Visit: Payer: PPO | Admitting: Physical Therapy

## 2017-08-18 DIAGNOSIS — H353132 Nonexudative age-related macular degeneration, bilateral, intermediate dry stage: Secondary | ICD-10-CM | POA: Diagnosis not present

## 2017-08-22 DIAGNOSIS — D225 Melanocytic nevi of trunk: Secondary | ICD-10-CM | POA: Diagnosis not present

## 2017-08-22 DIAGNOSIS — Z85828 Personal history of other malignant neoplasm of skin: Secondary | ICD-10-CM | POA: Diagnosis not present

## 2017-08-22 DIAGNOSIS — L570 Actinic keratosis: Secondary | ICD-10-CM | POA: Diagnosis not present

## 2017-08-22 DIAGNOSIS — L820 Inflamed seborrheic keratosis: Secondary | ICD-10-CM | POA: Diagnosis not present

## 2017-08-22 DIAGNOSIS — L538 Other specified erythematous conditions: Secondary | ICD-10-CM | POA: Diagnosis not present

## 2017-08-22 DIAGNOSIS — D2272 Melanocytic nevi of left lower limb, including hip: Secondary | ICD-10-CM | POA: Diagnosis not present

## 2017-08-22 DIAGNOSIS — X32XXXA Exposure to sunlight, initial encounter: Secondary | ICD-10-CM | POA: Diagnosis not present

## 2017-08-22 DIAGNOSIS — D2261 Melanocytic nevi of right upper limb, including shoulder: Secondary | ICD-10-CM | POA: Diagnosis not present

## 2017-08-23 ENCOUNTER — Encounter: Payer: PPO | Admitting: Physical Therapy

## 2017-08-25 ENCOUNTER — Ambulatory Visit: Payer: PPO | Admitting: Physical Therapy

## 2017-08-30 ENCOUNTER — Encounter: Payer: PPO | Admitting: Physical Therapy

## 2017-09-01 ENCOUNTER — Ambulatory Visit: Payer: PPO | Admitting: Physical Therapy

## 2017-09-06 ENCOUNTER — Encounter: Payer: PPO | Admitting: Physical Therapy

## 2017-09-08 ENCOUNTER — Encounter: Payer: PPO | Admitting: Physical Therapy

## 2017-09-13 ENCOUNTER — Encounter: Payer: PPO | Admitting: Physical Therapy

## 2017-09-15 ENCOUNTER — Encounter: Payer: PPO | Admitting: Physical Therapy

## 2017-10-06 DIAGNOSIS — M5137 Other intervertebral disc degeneration, lumbosacral region: Secondary | ICD-10-CM | POA: Diagnosis not present

## 2017-10-06 DIAGNOSIS — R739 Hyperglycemia, unspecified: Secondary | ICD-10-CM | POA: Diagnosis not present

## 2017-10-06 DIAGNOSIS — E782 Mixed hyperlipidemia: Secondary | ICD-10-CM | POA: Diagnosis not present

## 2017-10-06 DIAGNOSIS — I10 Essential (primary) hypertension: Secondary | ICD-10-CM | POA: Diagnosis not present

## 2017-10-06 DIAGNOSIS — C3490 Malignant neoplasm of unspecified part of unspecified bronchus or lung: Secondary | ICD-10-CM | POA: Diagnosis not present

## 2017-10-06 DIAGNOSIS — N183 Chronic kidney disease, stage 3 (moderate): Secondary | ICD-10-CM | POA: Diagnosis not present

## 2017-10-06 DIAGNOSIS — Z79899 Other long term (current) drug therapy: Secondary | ICD-10-CM | POA: Diagnosis not present

## 2017-10-07 ENCOUNTER — Ambulatory Visit
Admission: RE | Admit: 2017-10-07 | Discharge: 2017-10-07 | Disposition: A | Discharge status: Home / Self Care | Payer: PPO | Source: Ambulatory Visit | Attending: Hematology and Oncology | Admitting: Hematology and Oncology

## 2017-10-07 DIAGNOSIS — Z902 Acquired absence of lung [part of]: Secondary | ICD-10-CM | POA: Diagnosis not present

## 2017-10-07 DIAGNOSIS — J432 Centrilobular emphysema: Secondary | ICD-10-CM | POA: Insufficient documentation

## 2017-10-07 DIAGNOSIS — C61 Malignant neoplasm of prostate: Secondary | ICD-10-CM

## 2017-10-07 DIAGNOSIS — R911 Solitary pulmonary nodule: Secondary | ICD-10-CM | POA: Diagnosis not present

## 2017-10-07 DIAGNOSIS — I700 Atherosclerosis of aorta: Secondary | ICD-10-CM | POA: Insufficient documentation

## 2017-10-07 DIAGNOSIS — C3490 Malignant neoplasm of unspecified part of unspecified bronchus or lung: Secondary | ICD-10-CM | POA: Diagnosis not present

## 2017-10-07 DIAGNOSIS — J438 Other emphysema: Secondary | ICD-10-CM | POA: Insufficient documentation

## 2017-10-07 DIAGNOSIS — I2510 Atherosclerotic heart disease of native coronary artery without angina pectoris: Secondary | ICD-10-CM | POA: Insufficient documentation

## 2017-10-11 ENCOUNTER — Other Ambulatory Visit: Payer: Self-pay

## 2017-10-11 ENCOUNTER — Inpatient Hospital Stay: Payer: PPO | Attending: Hematology and Oncology

## 2017-10-11 ENCOUNTER — Inpatient Hospital Stay (HOSPITAL_BASED_OUTPATIENT_CLINIC_OR_DEPARTMENT_OTHER): Payer: PPO | Admitting: Hematology and Oncology

## 2017-10-11 ENCOUNTER — Other Ambulatory Visit: Payer: PPO

## 2017-10-11 ENCOUNTER — Ambulatory Visit: Payer: PPO | Admitting: Hematology and Oncology

## 2017-10-11 VITALS — BP 143/88 | HR 74 | Temp 97.0°F | Resp 20 | Wt 232.1 lb

## 2017-10-11 DIAGNOSIS — I714 Abdominal aortic aneurysm, without rupture: Secondary | ICD-10-CM | POA: Diagnosis not present

## 2017-10-11 DIAGNOSIS — Z950 Presence of cardiac pacemaker: Secondary | ICD-10-CM | POA: Insufficient documentation

## 2017-10-11 DIAGNOSIS — Z8546 Personal history of malignant neoplasm of prostate: Secondary | ICD-10-CM | POA: Insufficient documentation

## 2017-10-11 DIAGNOSIS — M5412 Radiculopathy, cervical region: Secondary | ICD-10-CM | POA: Insufficient documentation

## 2017-10-11 DIAGNOSIS — Z7982 Long term (current) use of aspirin: Secondary | ICD-10-CM | POA: Diagnosis not present

## 2017-10-11 DIAGNOSIS — Z96612 Presence of left artificial shoulder joint: Secondary | ICD-10-CM | POA: Insufficient documentation

## 2017-10-11 DIAGNOSIS — E7800 Pure hypercholesterolemia, unspecified: Secondary | ICD-10-CM | POA: Insufficient documentation

## 2017-10-11 DIAGNOSIS — F419 Anxiety disorder, unspecified: Secondary | ICD-10-CM | POA: Diagnosis not present

## 2017-10-11 DIAGNOSIS — Z87891 Personal history of nicotine dependence: Secondary | ICD-10-CM | POA: Diagnosis not present

## 2017-10-11 DIAGNOSIS — K219 Gastro-esophageal reflux disease without esophagitis: Secondary | ICD-10-CM | POA: Insufficient documentation

## 2017-10-11 DIAGNOSIS — C61 Malignant neoplasm of prostate: Secondary | ICD-10-CM

## 2017-10-11 DIAGNOSIS — Z951 Presence of aortocoronary bypass graft: Secondary | ICD-10-CM | POA: Diagnosis not present

## 2017-10-11 DIAGNOSIS — N183 Chronic kidney disease, stage 3 (moderate): Secondary | ICD-10-CM | POA: Insufficient documentation

## 2017-10-11 DIAGNOSIS — Z8673 Personal history of transient ischemic attack (TIA), and cerebral infarction without residual deficits: Secondary | ICD-10-CM | POA: Insufficient documentation

## 2017-10-11 DIAGNOSIS — C3490 Malignant neoplasm of unspecified part of unspecified bronchus or lung: Secondary | ICD-10-CM

## 2017-10-11 DIAGNOSIS — I2510 Atherosclerotic heart disease of native coronary artery without angina pectoris: Secondary | ICD-10-CM | POA: Diagnosis not present

## 2017-10-11 DIAGNOSIS — C3431 Malignant neoplasm of lower lobe, right bronchus or lung: Secondary | ICD-10-CM

## 2017-10-11 DIAGNOSIS — Z79899 Other long term (current) drug therapy: Secondary | ICD-10-CM | POA: Diagnosis not present

## 2017-10-11 DIAGNOSIS — M5136 Other intervertebral disc degeneration, lumbar region: Secondary | ICD-10-CM | POA: Diagnosis not present

## 2017-10-11 DIAGNOSIS — J449 Chronic obstructive pulmonary disease, unspecified: Secondary | ICD-10-CM | POA: Insufficient documentation

## 2017-10-11 LAB — CBC WITH DIFFERENTIAL/PLATELET
Basophils Absolute: 0 10*3/uL (ref 0–0.1)
Basophils Relative: 1 %
Eosinophils Absolute: 0.1 10*3/uL (ref 0–0.7)
Eosinophils Relative: 2 %
HCT: 42.9 % (ref 40.0–52.0)
Hemoglobin: 14.4 g/dL (ref 13.0–18.0)
Lymphocytes Relative: 18 %
Lymphs Abs: 1.2 10*3/uL (ref 1.0–3.6)
MCH: 29.5 pg (ref 26.0–34.0)
MCHC: 33.5 g/dL (ref 32.0–36.0)
MCV: 87.8 fL (ref 80.0–100.0)
Monocytes Absolute: 0.6 10*3/uL (ref 0.2–1.0)
Monocytes Relative: 9 %
Neutro Abs: 4.7 10*3/uL (ref 1.4–6.5)
Neutrophils Relative %: 70 %
Platelets: 190 10*3/uL (ref 150–440)
RBC: 4.89 MIL/uL (ref 4.40–5.90)
RDW: 15.5 % — ABNORMAL HIGH (ref 11.5–14.5)
WBC: 6.7 10*3/uL (ref 3.8–10.6)

## 2017-10-11 LAB — COMPREHENSIVE METABOLIC PANEL
ALT: 20 U/L (ref 17–63)
AST: 23 U/L (ref 15–41)
Albumin: 4.2 g/dL (ref 3.5–5.0)
Alkaline Phosphatase: 73 U/L (ref 38–126)
Anion gap: 9 (ref 5–15)
BUN: 28 mg/dL — ABNORMAL HIGH (ref 6–20)
CO2: 27 mmol/L (ref 22–32)
Calcium: 9.6 mg/dL (ref 8.9–10.3)
Chloride: 102 mmol/L (ref 101–111)
Creatinine, Ser: 2.1 mg/dL — ABNORMAL HIGH (ref 0.61–1.24)
GFR calc Af Amer: 34 mL/min — ABNORMAL LOW (ref >60)
GFR calc non Af Amer: 29 mL/min — ABNORMAL LOW (ref >60)
Glucose, Bld: 113 mg/dL — ABNORMAL HIGH (ref 65–99)
Potassium: 4.5 mmol/L (ref 3.5–5.1)
Sodium: 138 mmol/L (ref 135–145)
Total Bilirubin: 0.7 mg/dL (ref 0.3–1.2)
Total Protein: 7.6 g/dL (ref 6.5–8.1)

## 2017-10-11 NOTE — Progress Notes (Signed)
[facility] day:  10/11/2017  Chief Complaint: Rolfe T Nafis Farnan. is a 76 y.o. male with stage I right lower lobe lung cancer (2014) and a history of prostate cancer (2011) who is seen for review of interval CT scan and 1 year assessment.  HPI:  The patient was last seen in the medical oncology clinic on 10/11/2016.  At that time, he was seen for initial assessment.  Symptomatically, he felt good except for his back.  He had seen neurosurgery.  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  He denied any respiratory symptoms.  Exam was unremarkable.  Chest CT was scheduled in 1 year.  He was admitted to [facility] from 06/06/2017 - 06/08/2017 with weakness and dehydration.  He was noted to have had a left elbow fracture status post repair 2 weeks prior.  He had also undergone posterior lumbar interbody fusion on 03/16/2017.  He was  Noted to be still recovering from his lower back surgery.  He complained of significant pain and inability to move prior to admission.  Urinalysis was negative for infection.  He received IV fluids in the hospital.  He worked with physical therapy and had significant improvement.  Chest CT on 10/07/2017 revealed right lower lobectomy.  There was no evidence for recurrent or metastatic disease. There was a 4 mm left lower lobe pulmonary nodule.  No follow-up was needed if patient was at low-risk.  Non-contrast chest CT was recommended in 12 months if patient was high-risk.  During the interim, patient has done well overall. He continues to have lower back pain. Patient has had back surgery (Dr. Arnoldo Morale) and vascular stent placement (Dr. Lucky Cowboy) since his last visit. Patient denies any increased shortness of breath. 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 Patient has no B symptoms or interval infections.  Patient eating well, with no demonstrated weight loss.   Colonoscopy is up to date. He states, "Dr. Doy Hutching keeps on [place] about  that".    Past Medical History:  Diagnosis Date  . AAA (abdominal aortic aneurysm) ([place])   . AAA (abdominal aortic aneurysm) without rupture ([place])   . Anemia   . Aneurysm ([place])    abd aortic  . Anxiety   . Atrophic kidney   . Cervical radiculopathy   . Chronic airway obstruction (HCC)    not aware of this  . Chronic kidney disease (CKD), stage III (moderate) (HCC)    followed by Dr. Johnny Bridge  . Chronic tension headaches   . Coronary artery disease   . Coronary atherosclerosis of autologous vein bypass graft   . DDD (degenerative disc disease), lumbar   . Degenerative disc disease, lumbar    with lumbar radiculopathy  . Dyspnea   . Elbow fracture, left   . GERD (gastroesophageal reflux disease)   . H/O adenomatous polyp of colon   . H/O hemorrhoids   . H/O urticaria   . Headache   . Heart disease   . Hypercholesteremia   . Hyperlipidemia   . 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 Lucky Cowboy  . Lung cancer ([place])   . Lung cancer ([place])   . Meralgia paresthetica   . Meralgia paresthetica   . Neuralgia   . Osteoarthritis   . Osteoarthritis    s/p L knee surgery  . 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 Presence of permanent cardiac pacemaker   . Prostate cancer ([place])   . Second degree AV block    Followed by Dr. Nehemiah Massed  . Sinoatrial node dysfunction (HCC)   . Status post partial lobectomy of lung    bottom right   . Stroke ([place])   . TIA (transient ischemic attack)     Past Surgical History:  Procedure Laterality Date  . CATARACT EXTRACTION    . COLONOSCOPY    . COLONOSCOPY    . COLONOSCOPY WITH PROPOFOL N/A 10/20/2015   Procedure: COLONOSCOPY WITH PROPOFOL;  Surgeon: Manya Silvas, MD;  Location[facility] ENDOSCOPY;  Service: Endoscopy;  Laterality: N/A;  . CORONARY ARTERY BYPASS GRAFT     triple  . coronary atherosclerosis of autologous vein bypass graft    . EMBOLIZATION Right 12/27/2016    Procedure: Embolization;  Surgeon: Algernon Huxley, MD;  Location: [facility] LAB;  Service: Cardiovascular;  Laterality: Right;  . ENDOVASCULAR REPAIR/STENT GRAFT N/A 01/05/2017   Procedure: Endovascular Repair/Stent Graft;  Surgeon: Algernon Huxley, MD;  Location: [facility];  Service: Cardiovascular;  Laterality: N/A;  . EYE SURGERY Bilateral    cataract extraction  . JOINT REPLACEMENT     shoulder and knees  . KNEE ARTHROSCOPY    . LOBECTOMY  01/31/13   RLL w/squamous cell carcinoma lobectomy  . LUNG REMOVAL, PARTIAL  2014   right lower lobe  . PACEMAKER INSERTION    . PACEMAKER INSERTION  12/2012   Dual chanber pacemaker generator  . partial shoulder replacement Right   . POLYPECTOMY    . PROSTATECTOMY    . TOTAL KNEE ARTHROPLASTY Bilateral   . TOTAL SHOULDER ARTHROPLASTY Left 07/10/2015   Procedure: TOTAL SHOULDER ARTHROPLASTY;  Surgeon: [name], MD;  Location: [facility] ORS;  Service: Orthopedics;  Laterality: Left;  . TOTAL SHOULDER REPLACEMENT      Family History  Problem Relation Age of Onset  . Heart attack Mother   . Heart attack Father   . Breast cancer Sister   . Asthma Sister     Social History:  reports that he quit smoking about 13 years ago. He has a 67.50 pack-year smoking history. he has never used smokeless tobacco. He reports that he does not drink alcohol or use drugs.  He has a handicapped son.  He lives in [place]. The patient is accompanied by his wife, [name], today.  Allergies: No Known Allergies  Current Medications: Current Outpatient Medications  Medication Sig Dispense Refill  . amLODipine (NORVASC) 5 MG tablet Take 1 tablet (5 mg total) by mouth daily. 7 tablet 0  . aspirin EC 81 MG tablet Take 81 mg by mouth daily.     Marland Kitchen atorvastatin (LIPITOR) 40 MG tablet Take 40 mg by mouth at bedtime.     . Calcium Carbonate-Vitamin D3 (CALCIUM 600-D) 600-400 MG-UNIT TABS Take 1 tablet by mouth daily.    . carisoprodol (SOMA) 350 MG tablet Take 350 mg  by mouth 4 (four) times daily as needed for muscle spasms.    . cetirizine (ZYRTEC) 10 MG tablet Take 10 mg by mouth daily.    . clopidogrel (PLAVIX) 75 MG tablet Take 75 mg by mouth daily.     . clotrimazole-betamethasone (LOTRISONE) cream Apply 1 application topically 2 (two) times daily as needed (rash).    Marland Kitchen docusate sodium (COLACE) 100 MG capsule Take 100 mg by mouth daily.    Marland Kitchen donepezil (ARICEPT) 5 MG  tablet Take 5 mg by mouth at bedtime.    . ferrous sulfate (SLOW FE) 160 (50 FE) MG TBCR SR tablet Take 1 tablet by mouth daily.     . metoprolol succinate (TOPROL-XL) 25 MG 24 hr tablet Take 25 mg by mouth daily.    . Multiple Vitamin (MULTIVITAMIN WITH MINERALS) TABS tablet Take 1 tablet by mouth 2 (two) times daily.    . Multiple Vitamins-Minerals (PRESERVISION/LUTEIN) CAPS Take 1 capsule by mouth 2 (two) times daily.     . Omega-3 Fatty Acids (FISH OIL) 1200 MG CAPS Take 2 capsules by mouth 2 (two) times daily.     Marland Kitchen omeprazole (PRILOSEC) 20 MG capsule Take 20 mg by mouth daily.     . traMADol-acetaminophen (ULTRACET) 37.5-325 MG tablet Take 1 tablet by mouth 2 (two) times daily.     . vitamin B-12 (CYANOCOBALAMIN) 1000 MCG tablet Take 1,000 mcg by mouth daily.    Marland Kitchen zolpidem (AMBIEN) 10 MG tablet Take 10 mg by mouth at bedtime as needed for sleep.      No current facility-administered medications for this visit.     Review of Systems:  GENERAL:  Feels good except for back.  No fevers, sweats or weight loss. Weight up 8 pounds.  PERFORMANCE STATUS (ECOG):  1 HEENT:  No visual changes, runny nose, sore throat, mouth sores or tenderness. Lungs: No shortness of breath or cough.  No hemoptysis. Cardiac:  No chest pain, palpitations, orthopnea, or PND. GI:  Eating well.  No nausea, vomiting, diarrhea, constipation, melena or hematochezia. GU:  No urgency, frequency, dysuria, or hematuria. Musculoskeletal:  Back issues.  No joint pain.  No muscle tenderness. Extremities:  No pain or  swelling. Skin:  No rashes or skin changes. Neuro:  Neuropathy in feet.  No headache, numbness or weakness, balance or coordination issues. Endocrine:  No diabetes, thyroid issues, hot flashes or night sweats. Psych:  No mood changes, depression or anxiety. Pain:  Back pain; 2/10. Review of systems:  All other systems reviewed and found to be negative.  Physical Exam: Blood pressure (!) 143/88, pulse 74, temperature (!) 97 F (36.1 C), temperature source Tympanic, resp. rate 20, weight 232 lb 2 oz (105.3 kg). GENERAL:  Well developed, well nourished, gentleman sitting comfortably in the exam room in no acute distress. MENTAL STATUS:  Alert and oriented to person, place and time. HEAD:  Short graying hair.  Male pattern baldness.  Normocephalic, atraumatic, face symmetric, no Cushingoid features. EYES:  Glasses.  Brown/hazel eyes.  Pupils equal round and reactive to light and accomodation.  No conjunctivitis or scleral icterus. ENT:  Oropharynx clear without lesion.  Tongue normal.  Dentures.  Mucous membranes moist.  RESPIRATORY:  Clear to auscultation without rales, wheezes or rhonchi. CARDIOVASCULAR:  Regular rate and rhythm without murmur, rub or gallop. ABDOMEN:  Soft, non-tender, with active bowel sounds, and no hepatosplenomegaly.  No masses. SKIN:  Fungal nails.  No rashes, ulcers or lesions. EXTREMITIES: No edema, no skin discoloration or tenderness.  No palpable cords. LYMPH NODES: No palpable cervical, supraclavicular, axillary or inguinal adenopathy  NEUROLOGICAL: Unremarkable. PSYCH:  Appropriate.   Appointment on 10/11/2017  Component Date Value Ref Range Status  . Sodium 10/11/2017 138  135 - 145 mmol/L Final  . Potassium 10/11/2017 4.5  3.5 - 5.1 mmol/L Final  . Chloride 10/11/2017 102  101 - 111 mmol/L Final  . CO2 10/11/2017 27  22 - 32 mmol/L Final  . Glucose, Bld 10/11/2017 113*  65 - 99 mg/dL Final  . BUN 10/11/2017 28* 6 - 20 mg/dL Final  . Creatinine, Ser  10/11/2017 2.10* 0.61 - 1.24 mg/dL Final  . Calcium 10/11/2017 9.6  8.9 - 10.3 mg/dL Final  . Total Protein 10/11/2017 7.6  6.5 - 8.1 g/dL Final  . Albumin 10/11/2017 4.2  3.5 - 5.0 g/dL Final  . AST 10/11/2017 23  15 - 41 U/L Final  . ALT 10/11/2017 20  17 - 63 U/L Final  . Alkaline Phosphatase 10/11/2017 73  38 - 126 U/L Final  . Total Bilirubin 10/11/2017 0.7  0.3 - 1.2 mg/dL Final  . GFR calc non Af Amer 10/11/2017 29* >60 mL/min Final  . GFR calc Af Amer 10/11/2017 34* >60 mL/min Final   Comment: (NOTE) The eGFR has been calculated using the CKD EPI equation. This calculation has not been validated in all clinical situations. eGFR's persistently <60 mL/min signify possible Chronic Kidney Disease.   . Anion gap 10/11/2017 9  5 - 15 Final  . WBC 10/11/2017 6.7  3.8 - 10.6 K/uL Final  . RBC 10/11/2017 4.89  4.40 - 5.90 MIL/uL Final  . Hemoglobin 10/11/2017 14.4  13.0 - 18.0 g/dL Final  . HCT 10/11/2017 42.9  40.0 - 52.0 % Final  . MCV 10/11/2017 87.8  80.0 - 100.0 fL Final  . MCH 10/11/2017 29.5  26.0 - 34.0 pg Final  . MCHC 10/11/2017 33.5  32.0 - 36.0 g/dL Final  . RDW 10/11/2017 15.5* 11.5 - 14.5 % Final  . Platelets 10/11/2017 190  150 - 440 K/uL Final  . Neutrophils Relative % 10/11/2017 70  % Final  . Neutro Abs 10/11/2017 4.7  1.4 - 6.5 K/uL Final  . Lymphocytes Relative 10/11/2017 18  % Final  . Lymphs Abs 10/11/2017 1.2  1.0 - 3.6 K/uL Final  . Monocytes Relative 10/11/2017 9  % Final  . Monocytes Absolute 10/11/2017 0.6  0.2 - 1.0 K/uL Final  . Eosinophils Relative 10/11/2017 2  % Final  . Eosinophils Absolute 10/11/2017 0.1  0 - 0.7 K/uL Final  . Basophils Relative 10/11/2017 1  % Final  . Basophils Absolute 10/11/2017 0.0  0 - 0.1 K/uL Final    Assessment:  Wisam T Ryer Asato. is a 76 y.o. male with stage I right lower lobe lung cancer (2014) and a history of prostate cancer (2011).    Chest CT on 11/16/2012 revealed a 1.4 x 1.9 cm subpleural spiculated nodular  density.  PET scan on 01/16/2013 revealed abnormal localization within the right lower lobe concerning for underlying malignancy.  There was no mediastinal or hilar abnormal uptake to suggest metastatic disease  He underwent right lower lobe wedge resection on 01/31/2013.  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 The subcarinal lymph node was negative for malignancy.  Right lower lobe lobectomy revealed a focal residual squamous cell carcinoma (0.1 cm) adjacent to the staple line.  There was no pleural invasion. There was no lymphovascular invasion.  There were 3 lobar lymph nodes negative for malignancy. Pathologic stage was T1bN0.  PET scan on 10/24/2014 revealed right lower lobe surgical changes without evidence of hypermetabolic recurrent or metastatic disease. The lingula nodule been present and similar back to 09/24/2013.  He had a 4.5 x 4.3 cm infrarenal abdominal aortic aneurysm extension into the right common iliac artery unchanged.  CXR on 10/06/2016 revealed no active disease was pleural and parenchymal pulmonary scarring on the  right.  Dr. Lucky Cowboy is monitoring the aneurysm.  Chest CT on 10/07/2017 revealed no evidence for recurrent or metastatic disease. There was a 4 mm left lower lobe pulmonary nodule.   He has a history of prostate cancer.  He underwent robotic prostatectomy on 10/26/2010 at [facility] in [place], [place].  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  Tumor was limited to a few small foci involving both lobes.  Surgical margins were negative.  Pathologic stage was T2cNx.  PSA was < 0.01 on 04/23/2016.  He is followed by Dr. Alinda Money, urologist, at [facility] in [place].  Colonoscopy on 10/20/2015 revealed multiple polyps in the transverse colon and proximal ascending colon. There were 2 tubular adenomas and 3 hyperplastic polyps. All polyps were negative  for high-grade dysplasia or malignancy.  Next colonoscopy is planned in 2019.   He underwent left shoulder arthroplasty on 07/10/2015 by Dr. Milagros Evener for degenerative joint disease.  Left humeral head resection revealed osteoarthrosis, trilineage hematopoiesis, and no evidence of malignancy.   Symptomatically, he feels good except for his back.  He is s/p surgery.  He has also had vascular stents placed.  He is scheduled to follow up with Dr. Lucky Cowboy next month.  He denies any respiratory symptoms. Exam is stable. Creatinine is elevated to 2.10.  Plan: 1.  Labs today:  CBC with diff, CMP, CEA. 2.  Review interval chest CT- no evidence of recurrent disease. 3.  Discuss follow-up chest CT next year. Chest CT without contrast on 10/06/2018 4.  RTC after CT scan for MD assessment, labs (CBC with diff, CMP, CEA), and review of scan.   Honor Loh, NP  10/11/2017, 12:15 PM   I saw and evaluated the patient, participating in the key portions of the service and reviewing pertinent diagnostic studies and records.  I reviewed the nurse practitioner's note and agree with the findings and the plan.  The assessment and plan were discussed with the patient. Several questions were asked by the patient and answered.   Nolon Stalls, MD 10/11/2017,12:15 PM

## 2017-10-11 NOTE — Progress Notes (Signed)
Patient here for one year follow up regarding lung cancer.  Patient had CT and is also here for results.  Patient former patient of Dr. Oliva Bustard.  Has seen Dr. Mike Gip one time last year.

## 2017-10-12 LAB — CEA: CEA: 2.1 ng/mL (ref 0.0–4.7)

## 2017-10-13 DIAGNOSIS — I495 Sick sinus syndrome: Secondary | ICD-10-CM | POA: Diagnosis not present

## 2017-10-13 DIAGNOSIS — I714 Abdominal aortic aneurysm, without rupture: Secondary | ICD-10-CM | POA: Diagnosis not present

## 2017-10-13 DIAGNOSIS — I10 Essential (primary) hypertension: Secondary | ICD-10-CM | POA: Diagnosis not present

## 2017-10-13 DIAGNOSIS — E782 Mixed hyperlipidemia: Secondary | ICD-10-CM | POA: Diagnosis not present

## 2017-10-13 DIAGNOSIS — N183 Chronic kidney disease, stage 3 (moderate): Secondary | ICD-10-CM | POA: Diagnosis not present

## 2017-10-13 DIAGNOSIS — I441 Atrioventricular block, second degree: Secondary | ICD-10-CM | POA: Diagnosis not present

## 2017-10-13 DIAGNOSIS — I25810 Atherosclerosis of coronary artery bypass graft(s) without angina pectoris: Secondary | ICD-10-CM | POA: Diagnosis not present

## 2017-10-23 ENCOUNTER — Encounter: Payer: Self-pay | Admitting: Hematology and Oncology

## 2017-11-04 ENCOUNTER — Ambulatory Visit (INDEPENDENT_AMBULATORY_CARE_PROVIDER_SITE_OTHER): Payer: PPO | Admitting: Vascular Surgery

## 2017-11-04 ENCOUNTER — Ambulatory Visit (INDEPENDENT_AMBULATORY_CARE_PROVIDER_SITE_OTHER): Payer: PPO

## 2017-11-04 ENCOUNTER — Encounter (INDEPENDENT_AMBULATORY_CARE_PROVIDER_SITE_OTHER): Payer: Self-pay | Admitting: Vascular Surgery

## 2017-11-04 VITALS — BP 148/80 | HR 64 | Resp 16 | Wt 235.4 lb

## 2017-11-04 DIAGNOSIS — I714 Abdominal aortic aneurysm, without rupture, unspecified: Secondary | ICD-10-CM

## 2017-11-04 DIAGNOSIS — E7800 Pure hypercholesterolemia, unspecified: Secondary | ICD-10-CM

## 2017-11-04 DIAGNOSIS — I10 Essential (primary) hypertension: Secondary | ICD-10-CM

## 2017-11-04 NOTE — Progress Notes (Signed)
Subjective:    Patient ID: Rick Mcbride., male    DOB: [birth year], 76 y.o.   MRN: [id number] Chief Complaint  Patient presents with  . Follow-up    41mo EVAR   The patient presents for AAA follow up. He is s/p endovascular AAA repair on 01/05/17. He underwent an aortic duplex which was notable for an abdominal aortic aneurysm measuring 5.0cm AP x 5.2cm transverse (previous 5.0cm x 5.2cm). Type 2 endoleak noted. He denies any symptoms such as back pain, pulsatile abdominal masses or thrombosis in his extremities. His hypertension is adequately controlled and he has remained abstinent of tobacco use.   Review of Systems  Constitutional: Negative.   HENT: Negative.   Eyes: Negative.   Respiratory: Negative.   Cardiovascular: Negative.   Gastrointestinal: Negative.   Endocrine: Negative.   Genitourinary: Negative.   Musculoskeletal: Negative.   Skin: Negative.   Allergic/Immunologic: Negative.   Neurological: Negative.   Hematological: Negative.   Psychiatric/Behavioral: Negative.       Objective:   Physical Exam  Constitutional: He is oriented to person, place, and time. He appears well-developed and well-nourished.  HENT:  Head: Normocephalic and atraumatic.  Eyes: Conjunctivae are normal. Pupils are equal, round, and reactive to light.  Neck: Normal range of motion.  Cardiovascular: Normal rate, regular rhythm, normal heart sounds and intact distal pulses.  Pulses:      Radial pulses are 2+ on the right side, and 2+ on the left side.       Dorsalis pedis pulses are 2+ on the right side, and 2+ on the left side.       Posterior tibial pulses are 2+ on the right side, and 2+ on the left side.  Pulmonary/Chest: Effort normal and breath sounds normal.  Musculoskeletal: Normal range of motion. He exhibits no edema.  Neurological: He is alert and oriented to person, place, and time.  Skin: Skin is warm and dry. He is not diaphoretic.  Psychiatric: He has a normal mood and  affect. His behavior is normal. Judgment and thought content normal.  Vitals reviewed.  BP (!) 148/80 (BP Location: Right Arm)   Pulse 64   Resp 16   Wt 235 lb 6.4 oz (106.8 kg)   BMI 32.83 kg/m   Past Medical History:  Diagnosis Date  . AAA (abdominal aortic aneurysm) ([place])   . AAA (abdominal aortic aneurysm) without rupture ([place])   . Anemia   . Aneurysm ([place])    abd aortic  . Anxiety   . Atrophic kidney   . Cervical radiculopathy   . Chronic airway obstruction (HCC)    not aware of this  . Chronic kidney disease (CKD), stage III (moderate) (HCC)    followed by Dr. Johnny Bridge  . Chronic tension headaches   . Coronary artery disease   . Coronary atherosclerosis of autologous vein bypass graft   . DDD (degenerative disc disease), lumbar   . Degenerative disc disease, lumbar    with lumbar radiculopathy  . Dyspnea   . Elbow fracture, left   . GERD (gastroesophageal reflux disease)   . H/O adenomatous polyp of colon   . H/O hemorrhoids   . H/O urticaria   . Headache   . Heart disease   . Hypercholesteremia   . Hyperlipidemia   . 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 Lucky Cowboy  . Lung cancer ([place])   .  Lung cancer ([place])   . Meralgia paresthetica   . Meralgia paresthetica   . Neuralgia   . Osteoarthritis   . Osteoarthritis    s/p L knee surgery  . 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 Presence of permanent cardiac pacemaker   . Prostate cancer ([place])   . Second degree AV block    Followed by Dr. Nehemiah Massed  . Sinoatrial node dysfunction (HCC)   . Status post partial lobectomy of lung    bottom right   . Stroke ([place])   . TIA (transient ischemic attack)    Social History   Socioeconomic History  . Marital status: Married    Spouse name: Not on file  . Number of children: 2  . Years of education: 52  . Highest education level: Not on file  Social Needs  . Financial resource strain: Not on  file  . Food insecurity - worry: Not on file  . Food insecurity - inability: Not on file  . Transportation needs - medical: Not on file  . Transportation needs - non-medical: Not on file  Occupational History  . 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 Smoking status: Former Smoker    Packs/day: 1.50    Years: 45.00    Pack years: 67.50    Last attempt to quit: 04/07/2004    Years since quitting: 13.5  . Smokeless tobacco: Never Used  Substance and Sexual Activity  . Alcohol use: No  . Drug use: No  . Sexual activity: Not on file  Other Topics Concern  . Not on file  Social History Narrative   Patient drinks 4-5 cups of caffeine daily.   Patient is right handed.   Past Surgical History:  Procedure Laterality Date  . CATARACT EXTRACTION    . COLONOSCOPY    . COLONOSCOPY    . COLONOSCOPY WITH PROPOFOL N/A 10/20/2015   Procedure: COLONOSCOPY WITH PROPOFOL;  Surgeon: Manya Silvas, MD;  Location: [facility] ENDOSCOPY;  Service: Endoscopy;  Laterality: N/A;  . CORONARY ARTERY BYPASS GRAFT     triple  . coronary atherosclerosis of autologous vein bypass graft    . EMBOLIZATION Right 12/27/2016   Procedure: Embolization;  Surgeon: Algernon Huxley, MD;  Location: [place] [facility];  Service: Cardiovascular;  Laterality: Right;  . ENDOVASCULAR REPAIR/STENT GRAFT N/A 01/05/2017   Procedure: Endovascular Repair/Stent Graft;  Surgeon: Algernon Huxley, MD;  Location: [place] [facility];  Service: Cardiovascular;  Laterality: N/A;  . EYE SURGERY Bilateral    cataract extraction  . JOINT REPLACEMENT     shoulder and knees  . KNEE ARTHROSCOPY    . LOBECTOMY  01/31/13   RLL w/squamous cell carcinoma lobectomy  . LUNG REMOVAL, PARTIAL  2014   right lower lobe  . PACEMAKER INSERTION    . PACEMAKER INSERTION  12/2012   Dual chanber pacemaker generator  . partial shoulder replacement Right   . POLYPECTOMY    . PROSTATECTOMY    . TOTAL KNEE ARTHROPLASTY Bilateral   .  TOTAL SHOULDER ARTHROPLASTY Left 07/10/2015   Procedure: TOTAL SHOULDER ARTHROPLASTY;  Surgeon: [name], MD;  Location: [facility] ORS;  Service: Orthopedics;  Laterality: Left;  . TOTAL SHOULDER REPLACEMENT     Family History  Problem Relation Age of Onset  . Heart attack Mother   . Heart attack Father   . Breast cancer Sister   . Asthma  Sister    No Known Allergies     Assessment & Plan:  The patient presents for AAA follow up. He is s/p endovascular AAA repair on 01/05/17. He underwent an aortic duplex which was notable for an abdominal aortic aneurysm measuring 5.0cm AP x 5.2cm transverse (previous 5.0cm x 5.2cm). Type 2 endoleak noted. He denies any symptoms such as back pain, pulsatile abdominal masses or thrombosis in his extremities. His hypertension is adequately controlled and he has remained abstinent of tobacco use.  1. AAA (abdominal aortic aneurysm) without rupture ([place]) -  Studies reviewed with patient. Duplex stable with type two endoleak, physical exam unremarkable. No surgery or intervention at this time. The patient has a stable asymptomatic abdominal aortic aneurysm. I have reviewed the natural history of abdominal aortic aneurysms and the need for continued surveillance with ultrasound or CT scan is mandatory.  I have discussed with the patient at length the risk factors for and pathogenesis of atherosclerotic disease and encouraged a healthy diet, regular exercise regimen and blood pressure / glucose control. The patient is to follow up in one year for a EVAR with ABI's. Patient was instructed to contact our office with problems in the interim such back pain, pulsatile abdominal masses or thrombosis in her extremities, extremity pain or development of ulcerations.    - VAS [place] ABI WITH/WO TBI; Future - VAS [place] EVAR DUPLEX; Future  2. Pure hypercholesterolemia - Stable Encouraged good control as its slows the progression of atherosclerotic disease  3. Essential  hypertension - Stable Encouraged good control as its slows the progression of atherosclerotic disease  Current Outpatient Medications on File Prior to Visit  Medication Sig Dispense Refill  . amLODipine (NORVASC) 5 MG tablet Take 1 tablet (5 mg total) by mouth daily. 7 tablet 0  . aspirin EC 81 MG tablet Take 81 mg by mouth daily.     Marland Kitchen atorvastatin (LIPITOR) 40 MG tablet Take 40 mg by mouth at bedtime.     . Calcium Carbonate-Vitamin D3 (CALCIUM 600-D) 600-400 MG-UNIT TABS Take 1 tablet by mouth daily.    . carisoprodol (SOMA) 350 MG tablet Take 350 mg by mouth 4 (four) times daily as needed for muscle spasms.    . cetirizine (ZYRTEC) 10 MG tablet Take 10 mg by mouth daily.    . clopidogrel (PLAVIX) 75 MG tablet Take 75 mg by mouth daily.     . clotrimazole-betamethasone (LOTRISONE) cream Apply 1 application topically 2 (two) times daily as needed (rash).    Marland Kitchen docusate sodium (COLACE) 100 MG capsule Take 100 mg by mouth daily.    Marland Kitchen donepezil (ARICEPT) 5 MG tablet Take 5 mg by mouth at bedtime.    . ferrous sulfate (SLOW FE) 160 (50 FE) MG TBCR SR tablet Take 1 tablet by mouth daily.     . metoprolol succinate (TOPROL-XL) 25 MG 24 hr tablet Take 25 mg by mouth daily.    . Multiple Vitamin (MULTIVITAMIN WITH MINERALS) TABS tablet Take 1 tablet by mouth 2 (two) times daily.    . Multiple Vitamins-Minerals (PRESERVISION/LUTEIN) CAPS Take 1 capsule by mouth 2 (two) times daily.     . Omega-3 Fatty Acids (FISH OIL) 1200 MG CAPS Take 2 capsules by mouth 2 (two) times daily.     Marland Kitchen omeprazole (PRILOSEC) 20 MG capsule Take 20 mg by mouth daily.     . traMADol-acetaminophen (ULTRACET) 37.5-325 MG tablet Take 1 tablet by mouth 2 (two) times daily.     Marland Kitchen  vitamin B-12 (CYANOCOBALAMIN) 1000 MCG tablet Take 1,000 mcg by mouth daily.    Marland Kitchen zolpidem (AMBIEN) 10 MG tablet Take 10 mg by mouth at bedtime as needed for sleep.      No current facility-administered medications on file prior to visit.     There  are no Patient Instructions on file for this visit. No Follow-up on file.   Normalee Sistare A Shemeika Starzyk, PA-C

## 2017-11-09 DIAGNOSIS — N183 Chronic kidney disease, stage 3 (moderate): Secondary | ICD-10-CM | POA: Diagnosis not present

## 2017-11-09 DIAGNOSIS — I129 Hypertensive chronic kidney disease with stage 1 through stage 4 chronic kidney disease, or unspecified chronic kidney disease: Secondary | ICD-10-CM | POA: Diagnosis not present

## 2017-11-09 DIAGNOSIS — R809 Proteinuria, unspecified: Secondary | ICD-10-CM | POA: Diagnosis not present

## 2017-11-09 DIAGNOSIS — N2581 Secondary hyperparathyroidism of renal origin: Secondary | ICD-10-CM | POA: Diagnosis not present

## 2017-12-20 DIAGNOSIS — I441 Atrioventricular block, second degree: Secondary | ICD-10-CM | POA: Diagnosis not present

## 2017-12-26 DIAGNOSIS — L218 Other seborrheic dermatitis: Secondary | ICD-10-CM | POA: Diagnosis not present

## 2017-12-26 DIAGNOSIS — L820 Inflamed seborrheic keratosis: Secondary | ICD-10-CM | POA: Diagnosis not present

## 2018-01-05 DIAGNOSIS — Z125 Encounter for screening for malignant neoplasm of prostate: Secondary | ICD-10-CM | POA: Diagnosis not present

## 2018-01-05 DIAGNOSIS — R739 Hyperglycemia, unspecified: Secondary | ICD-10-CM | POA: Diagnosis not present

## 2018-01-05 DIAGNOSIS — C3490 Malignant neoplasm of unspecified part of unspecified bronchus or lung: Secondary | ICD-10-CM | POA: Diagnosis not present

## 2018-01-05 DIAGNOSIS — I10 Essential (primary) hypertension: Secondary | ICD-10-CM | POA: Diagnosis not present

## 2018-01-05 DIAGNOSIS — I714 Abdominal aortic aneurysm, without rupture: Secondary | ICD-10-CM | POA: Diagnosis not present

## 2018-01-05 DIAGNOSIS — E7800 Pure hypercholesterolemia, unspecified: Secondary | ICD-10-CM | POA: Diagnosis not present

## 2018-01-05 DIAGNOSIS — Z0000 Encounter for general adult medical examination without abnormal findings: Secondary | ICD-10-CM | POA: Diagnosis not present

## 2018-01-05 DIAGNOSIS — Z79899 Other long term (current) drug therapy: Secondary | ICD-10-CM | POA: Diagnosis not present

## 2018-02-14 DIAGNOSIS — Z6832 Body mass index (BMI) 32.0-32.9, adult: Secondary | ICD-10-CM | POA: Diagnosis not present

## 2018-02-14 DIAGNOSIS — R030 Elevated blood-pressure reading, without diagnosis of hypertension: Secondary | ICD-10-CM | POA: Diagnosis not present

## 2018-02-14 DIAGNOSIS — M4317 Spondylolisthesis, lumbosacral region: Secondary | ICD-10-CM | POA: Diagnosis not present

## 2018-02-22 DIAGNOSIS — H6121 Impacted cerumen, right ear: Secondary | ICD-10-CM | POA: Diagnosis not present

## 2018-02-22 DIAGNOSIS — J300 Vasomotor rhinitis: Secondary | ICD-10-CM | POA: Diagnosis not present

## 2018-02-23 DIAGNOSIS — H353132 Nonexudative age-related macular degeneration, bilateral, intermediate dry stage: Secondary | ICD-10-CM | POA: Diagnosis not present

## 2018-03-21 DIAGNOSIS — J309 Allergic rhinitis, unspecified: Secondary | ICD-10-CM | POA: Diagnosis not present

## 2018-03-21 DIAGNOSIS — J300 Vasomotor rhinitis: Secondary | ICD-10-CM | POA: Diagnosis not present

## 2018-03-30 DIAGNOSIS — E7800 Pure hypercholesterolemia, unspecified: Secondary | ICD-10-CM | POA: Diagnosis not present

## 2018-03-30 DIAGNOSIS — Z79899 Other long term (current) drug therapy: Secondary | ICD-10-CM | POA: Diagnosis not present

## 2018-03-30 DIAGNOSIS — I10 Essential (primary) hypertension: Secondary | ICD-10-CM | POA: Diagnosis not present

## 2018-03-30 DIAGNOSIS — R739 Hyperglycemia, unspecified: Secondary | ICD-10-CM | POA: Diagnosis not present

## 2018-03-30 DIAGNOSIS — Z125 Encounter for screening for malignant neoplasm of prostate: Secondary | ICD-10-CM | POA: Diagnosis not present

## 2018-04-06 DIAGNOSIS — C3490 Malignant neoplasm of unspecified part of unspecified bronchus or lung: Secondary | ICD-10-CM | POA: Diagnosis not present

## 2018-04-06 DIAGNOSIS — I10 Essential (primary) hypertension: Secondary | ICD-10-CM | POA: Diagnosis not present

## 2018-04-06 DIAGNOSIS — R739 Hyperglycemia, unspecified: Secondary | ICD-10-CM | POA: Diagnosis not present

## 2018-04-06 DIAGNOSIS — N183 Chronic kidney disease, stage 3 (moderate): Secondary | ICD-10-CM | POA: Diagnosis not present

## 2018-04-06 DIAGNOSIS — Z0000 Encounter for general adult medical examination without abnormal findings: Secondary | ICD-10-CM | POA: Diagnosis not present

## 2018-04-06 DIAGNOSIS — Z79899 Other long term (current) drug therapy: Secondary | ICD-10-CM | POA: Diagnosis not present

## 2018-04-06 DIAGNOSIS — E782 Mixed hyperlipidemia: Secondary | ICD-10-CM | POA: Diagnosis not present

## 2018-04-10 DIAGNOSIS — E7800 Pure hypercholesterolemia, unspecified: Secondary | ICD-10-CM | POA: Diagnosis not present

## 2018-04-10 DIAGNOSIS — I714 Abdominal aortic aneurysm, without rupture: Secondary | ICD-10-CM | POA: Diagnosis not present

## 2018-04-10 DIAGNOSIS — I10 Essential (primary) hypertension: Secondary | ICD-10-CM | POA: Diagnosis not present

## 2018-04-10 DIAGNOSIS — I495 Sick sinus syndrome: Secondary | ICD-10-CM | POA: Diagnosis not present

## 2018-04-10 DIAGNOSIS — I25810 Atherosclerosis of coronary artery bypass graft(s) without angina pectoris: Secondary | ICD-10-CM | POA: Diagnosis not present

## 2018-05-15 DIAGNOSIS — N2581 Secondary hyperparathyroidism of renal origin: Secondary | ICD-10-CM | POA: Diagnosis not present

## 2018-05-15 DIAGNOSIS — I129 Hypertensive chronic kidney disease with stage 1 through stage 4 chronic kidney disease, or unspecified chronic kidney disease: Secondary | ICD-10-CM | POA: Diagnosis not present

## 2018-05-15 DIAGNOSIS — N183 Chronic kidney disease, stage 3 (moderate): Secondary | ICD-10-CM | POA: Diagnosis not present

## 2018-05-15 DIAGNOSIS — R809 Proteinuria, unspecified: Secondary | ICD-10-CM | POA: Diagnosis not present

## 2018-05-17 ENCOUNTER — Encounter: Payer: Self-pay | Admitting: Dietician

## 2018-05-17 ENCOUNTER — Encounter: Payer: PPO | Attending: Internal Medicine | Admitting: Dietician

## 2018-05-17 VITALS — Ht 71.0 in | Wt 232.1 lb

## 2018-05-17 DIAGNOSIS — Z713 Dietary counseling and surveillance: Secondary | ICD-10-CM | POA: Insufficient documentation

## 2018-05-17 DIAGNOSIS — E782 Mixed hyperlipidemia: Secondary | ICD-10-CM | POA: Insufficient documentation

## 2018-05-17 DIAGNOSIS — I10 Essential (primary) hypertension: Secondary | ICD-10-CM | POA: Diagnosis not present

## 2018-05-17 DIAGNOSIS — Z6832 Body mass index (BMI) 32.0-32.9, adult: Secondary | ICD-10-CM | POA: Insufficient documentation

## 2018-05-17 DIAGNOSIS — R739 Hyperglycemia, unspecified: Secondary | ICD-10-CM | POA: Insufficient documentation

## 2018-05-17 NOTE — Patient Instructions (Addendum)
   Frozen meal brands: Healthy choice, Eating well, Lean cuisine. Add extra servings of vegetables to the meal if you'd like  Guidelines for sodium in frozen meals: 600mg , less than 300mg  is ideal for all foods *per serving  When reading a nutrition label, try to choose foods that are 10g of fat or less per serving. Mostly coming from unsaturated fats Also choose foods lower in sodium and added sugar  Less than 10g of fat per serving is considered a low sugar option  High fiber foods: 4g or more per serving. Choose 100% whole grain when possible. Eats foods with the skins and include fruits and vegetables regularly  Continue to increase your daily fluid intake- great job!

## 2018-05-17 NOTE — Progress Notes (Signed)
Medical Nutrition Therapy: Visit start time: 1330  end time: 1430  Assessment:  Diagnosis: HTN, HLD Past medical history: CKD stage III, GERD, Anemia, Heart Disease, Lung Cancer Psychosocial issues/ stress concerns: h/o anxiety  Preferred learning method:  . Visual  Current weight: 232.1#  Height: 5\' 11"  Medications, supplements: Atorvastatin, Ca + Vit D, Fish oil, Vit B12, Iron, Omeprazole, Atorvastatin, MVI, see chart for full list  Progress and evaluation: Pt would like to know if his diet can be improved in order to lose weight and decrease chronic back pain. He has been less mobile in the past couple of years d/t multiple health complications which now limit his mobility and have increased his pain. He is not able to exercise like he used to. His wife plans and cooks meals though states she prefers not to cook. They often resort to frozen meals for dinner and may not have lunch. They do not fry foods and have cut out soft drinks and most snack foods. She does not add salt to meals when cooking and does buy some lower sodium food options but does not necessarily pay attention to sodium content in purchased prepared meals. Per pt, he does not always remember to drink fluids throughout the day which has resulted in two dehydration related hospital admissions. His current fluid goal is 16oz 4x/day.    Physical activity: gym twice per week for 1 hour; leg extensions and treadmill incline walking. Can't do upper body at this time d/t limited mobility  Dietary Intake:  Usual eating pattern includes 2 meals and 0-1 snacks per day. Dining out frequency: 3-6 meals per week.  Breakfast: Cereal (Mini Wheats and Special K mixed) with 2% milk + orange juice + banana, goes out every Wednesday with a friend (biscuit, eggs, sausage, toast) Snack: Nabs or Vanilla wafers occasionally Lunch: Typically skip. Sandwich on wheat bread (sandwich meat like chicken + cheese or banana + peanut butter) Snack: not  usually Supper: Grilled chicken, Wednesday nights salmon / [place] salad and bring home leftovers, liver, frozen meals ([place] "for two" meals), salads, Chinese frozen meals, brown rice, baked potatoes or sweet potatoes, eggs on Sunday nights with veggie sausage and slice of toast Snack: not usually Beverages: coffee, water, orange juice, tea with zero-calorie sweetener occasionally  Nutrition Care Education: Topics covered: dietary sources of fiber, how to identify high fiber/ low salt/ low fat/ low sugar foods using a food label, increasing fluid intake, portion control and the plate method, balance of physical activity and diet for weight reduction/ improved back pain, reducing portion sizes Basic nutrition: basic food groups, appropriate nutrient balance, appropriate meal and snack schedule, general nutrition guidelines    Weight control: benefits of weight control, determining reasonable weight goal, behavioral changes for weight loss Advanced nutrition: cooking techniques, dining out, food label reading Hypertension: identifying high sodium foods, identifying food sources of Calcium, potassium, magnesium Hyperlipidemia:  healthy and unhealthy fats, role of fiber food sources of folate, Vitamin B12 Other lifestyle changes: benefits of making changes, increasing motivation, readiness for change, identifying habits that need to change  Nutritional Diagnosis:  Superior-3.3 Overweight/obesity As related to decreased physical activity.  As evidenced by patient report of joint pain, back pain and limited upper body mobility limiting his ability to perform ADLs and exercise. NI-5.6.3 Inappropriate intake of fats (specify): saturated and trans As related to HLD.  As evidenced by frequent intake of pre-packaged frozen meals, breakfast meats and other high-fat meats/biscuits / TG 212.  Intervention: Discussion  as noted above. He and his wife will work on being more conscious of the types of foods they are  buying, particularly in regards to sodium and fat content. They will try to choose frozen meals that are more health-conscious and will also try to increase their daily intake of fruits and vegetables. His wife is planning on providing snacks like yogurt with fruit and re-starting their weekly salmon dinner, as he has started to select [place] salads instead of salmon for this meal. They are going to try sweet potatoes too.  Education Materials given:  . General diet guidelines for Cholesterol-lowering/ Heart health . Meet the Fats handout . Food lists/ Planning A Balanced Meal . Goals/ instructions  Learner/ who was taught:  . Patient  . Spouse/ partner: Wife  Level of understanding: Marland Kitchen Verbalizes/ demonstrates competency  Demonstrated degree of understanding via:   Teach back Learning barriers: . None: wife . Cognitive limitations: patient with h/o TIA with short-term memory loss  Willingness to learn/ readiness for change: . Acceptance, ready for change  Monitoring and Evaluation:  Dietary intake, exercise, lipid labs, and body weight      follow up: prn

## 2018-06-13 IMAGING — DX DG CHEST 1V PORT
1 series · 1 of 1 positions shown · non-contrast
Comparison: Chest radiograph 01/26/2017

CLINICAL DATA: Cough and fever

EXAM:
PORTABLE CHEST 1 VIEW

[chest ap]
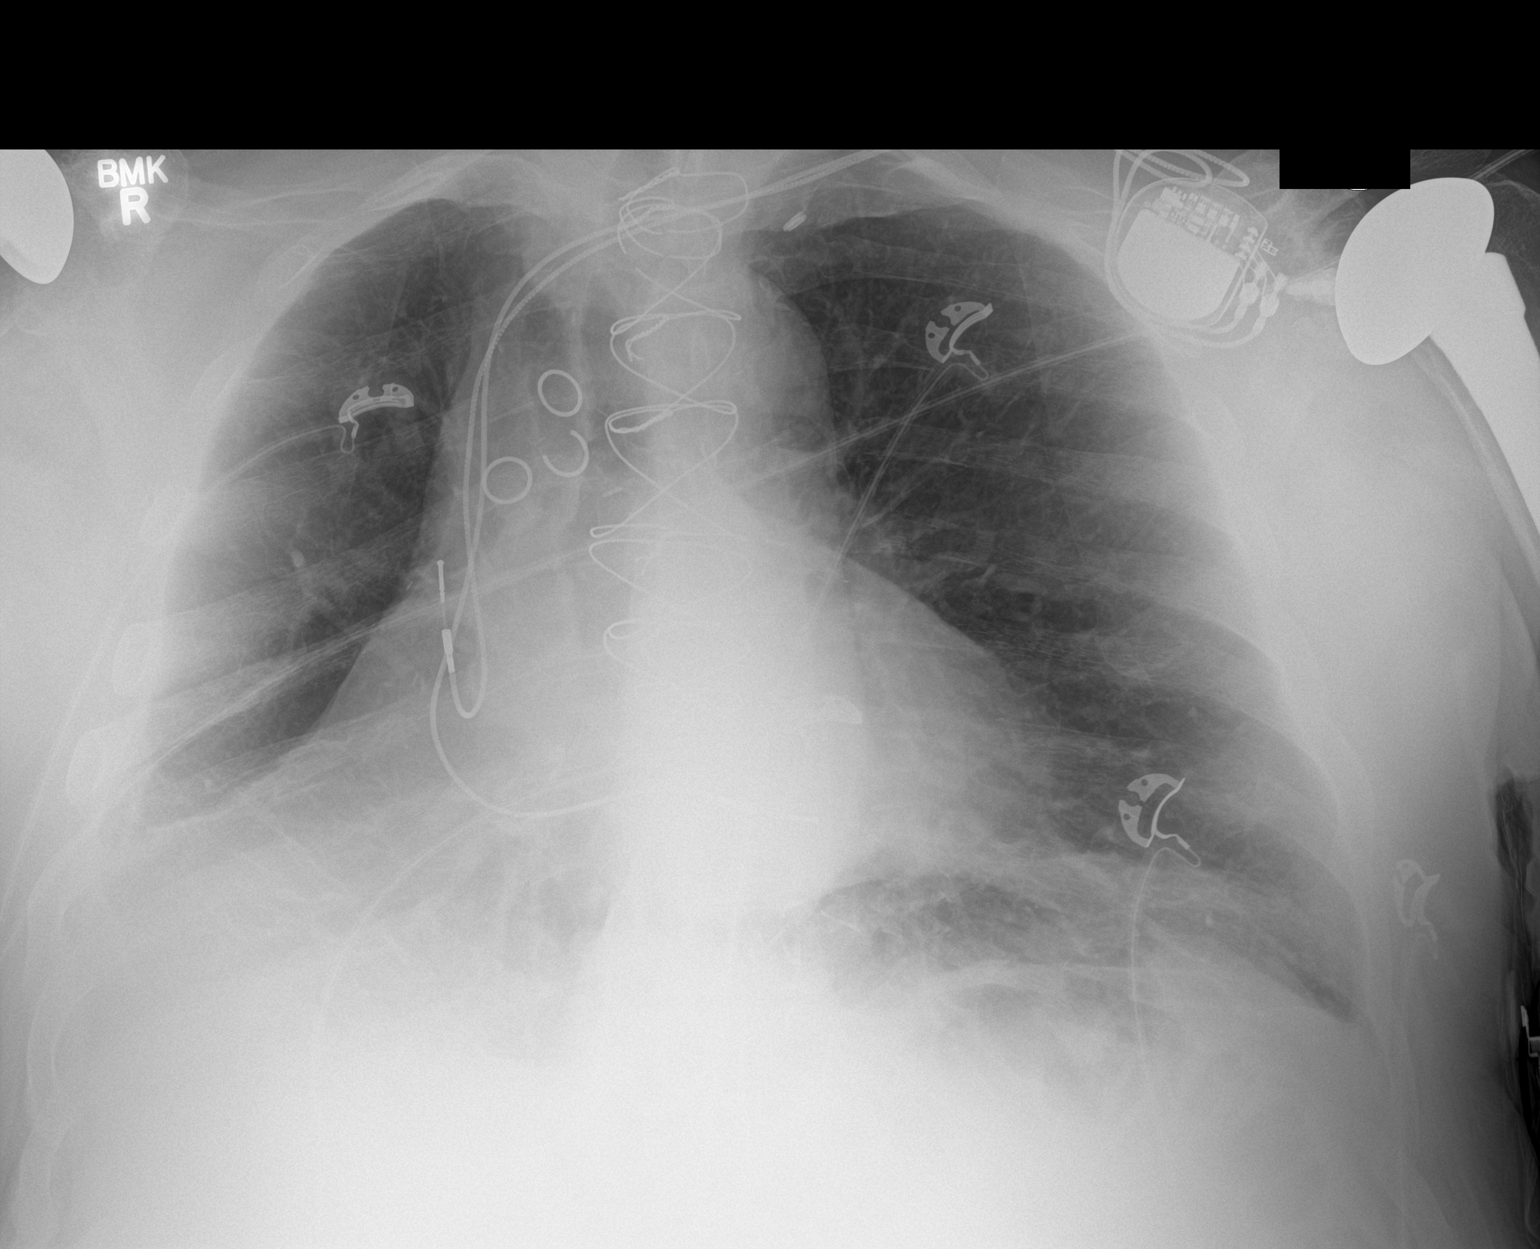

[1 of 1 positions shown; findings below may reference images not displayed]

FINDINGS: There are median sternotomy wires and CABG markers again seen. There
is a left chest wall AICD with leads in unchanged position. The
cardiomediastinal silhouette remains enlarged. There is a small
right pleural effusion and associated atelectasis. No focal
consolidation. No pulmonary edema.
IMPRESSION: Unchanged cardiomegaly with small right pleural effusion and
associated atelectasis.

## 2018-06-13 IMAGING — CT CT HEAD W/O CM
3 series · 15 of 47 positions shown, 18 images · non-contrast
Comparison: Head CTA 11/04/2014

CLINICAL DATA: Fall, confusion.  Possible TIA.

EXAM:
CT HEAD WITHOUT CONTRAST
TECHNIQUE: Contiguous axial images were obtained from the base of the skull
through the vertex without intravenous contrast.

[Series 3: head wo · axial · 0.44mm/px · z∈[-44,+81]mm · 9 of 31 slices shown, 12 images]
[im 3/31  brain]
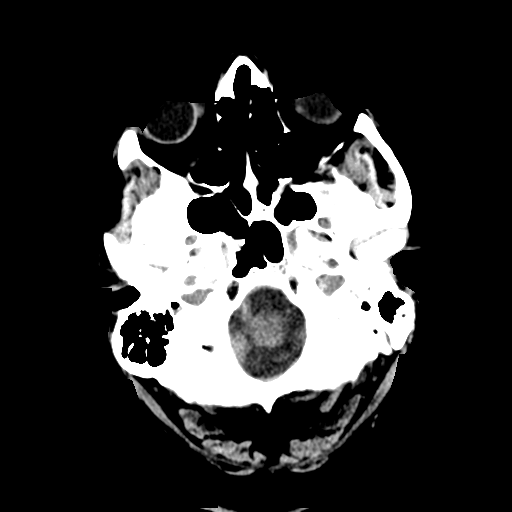
[im 3/31  bone]
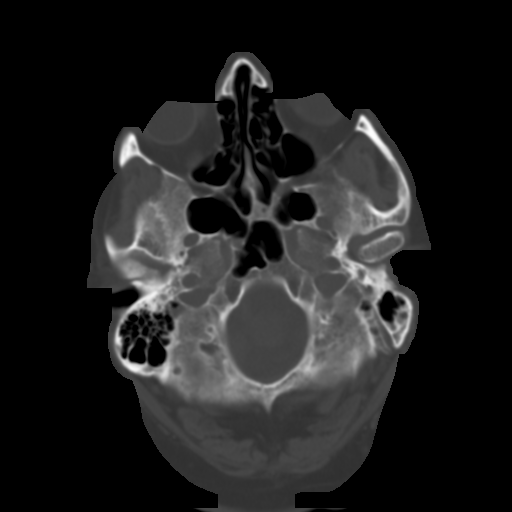
[im 6/31  brain]
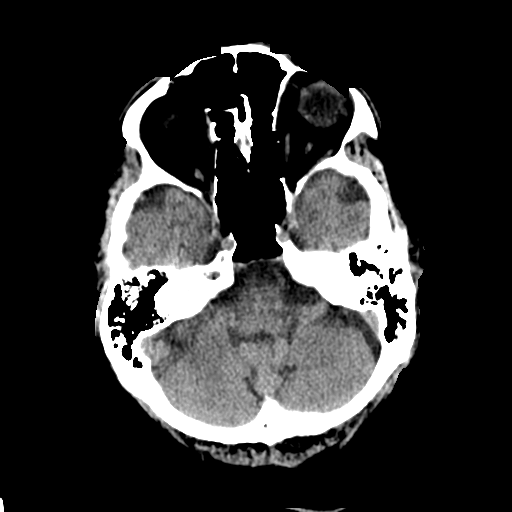
[im 9/31  brain]
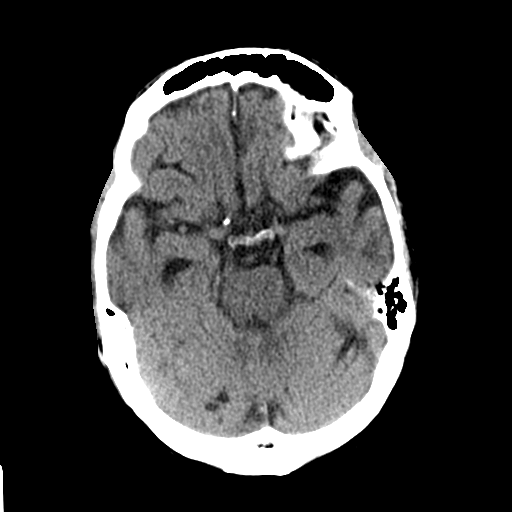
[im 12/31  brain]
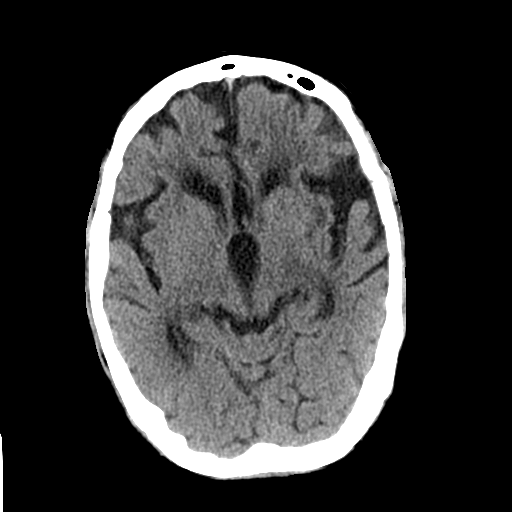
[im 16/31  brain]
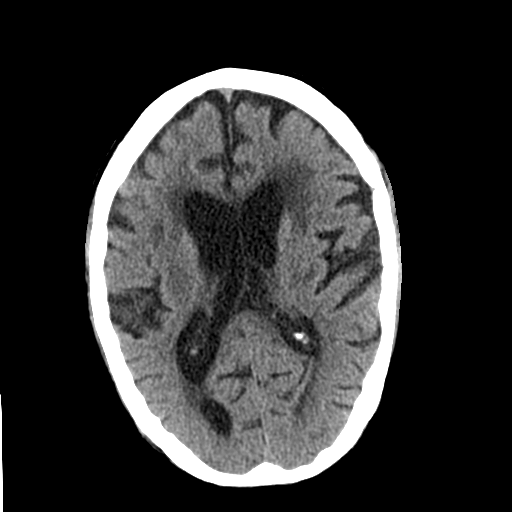
[im 16/31  bone]
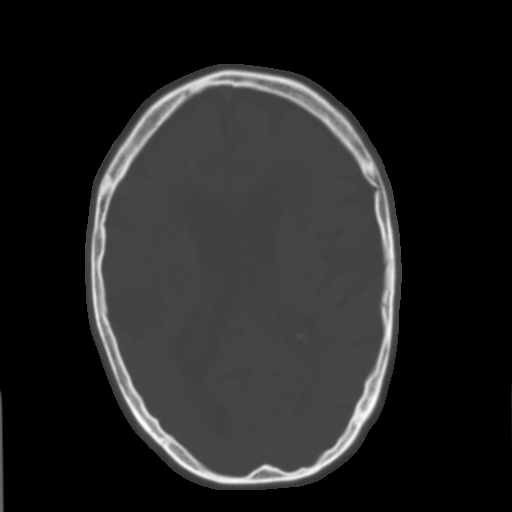
[im 19/31  brain]
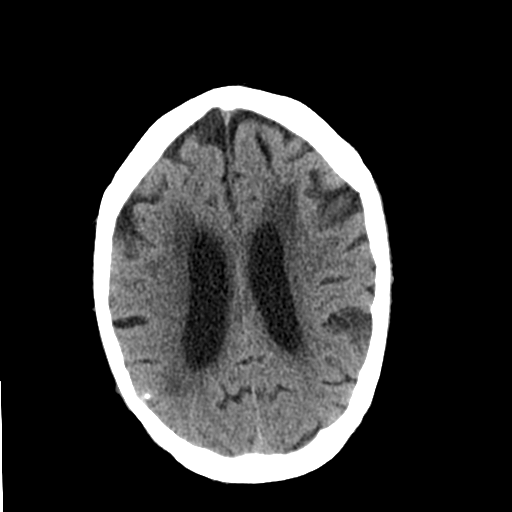
[im 22/31  brain]
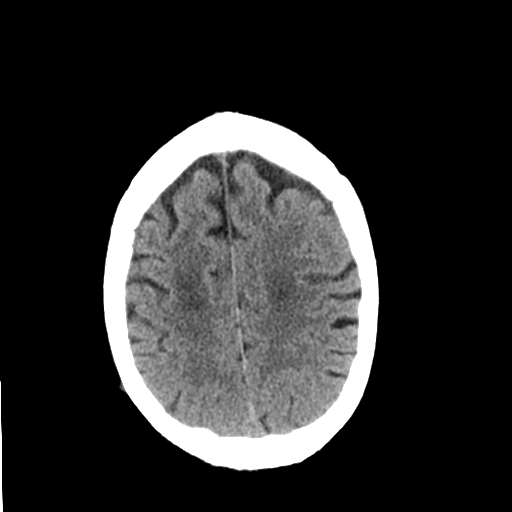
[im 25/31  brain]
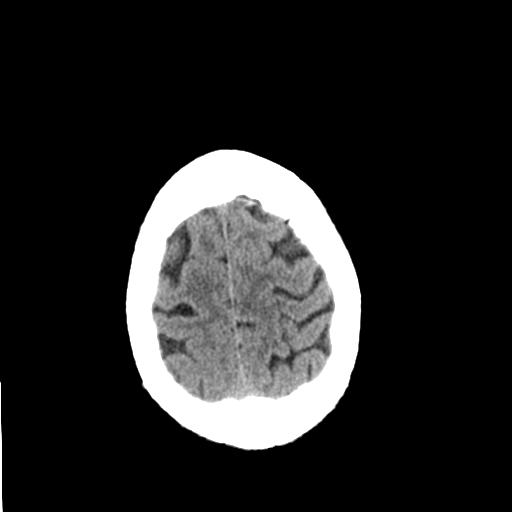
[im 28/31  brain]
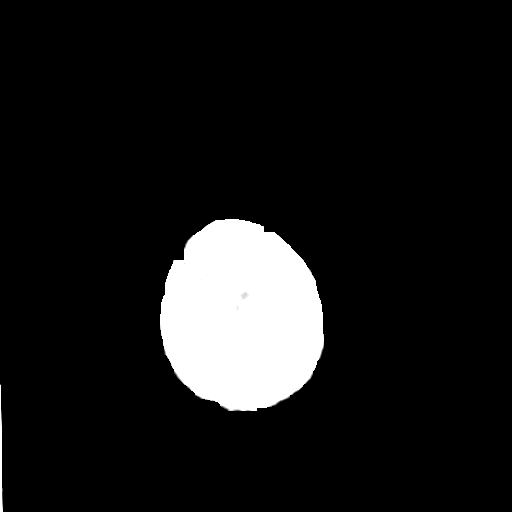
[im 28/31  bone]
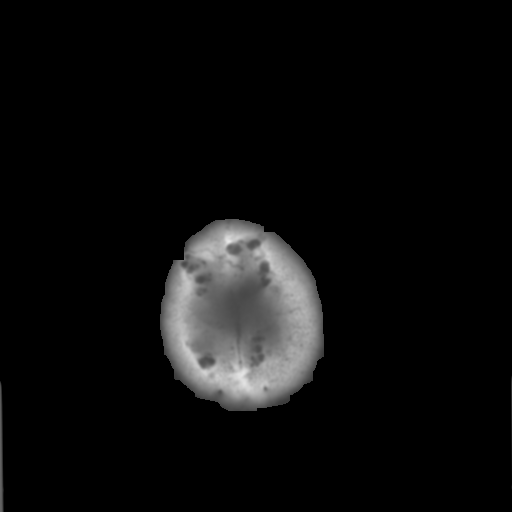

[Series 4: coronal soft tissue · coronal · 0.29mm/px · 3 of 62 slices shown]
[im 21/62  brain]
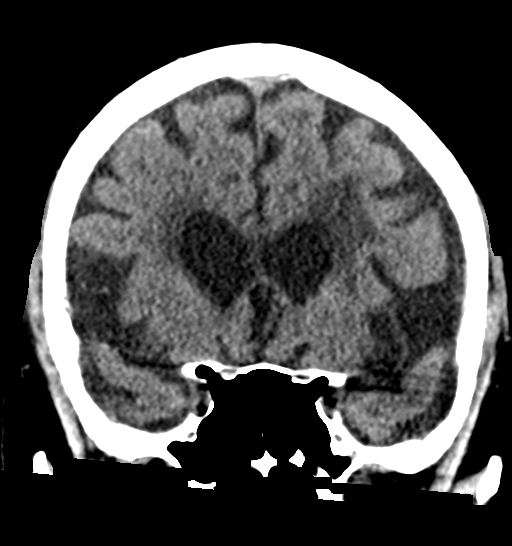
[im 28/62  brain]
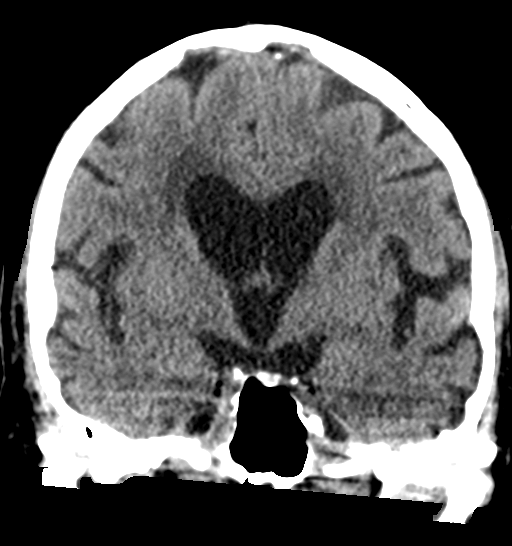
[im 34/62  brain]
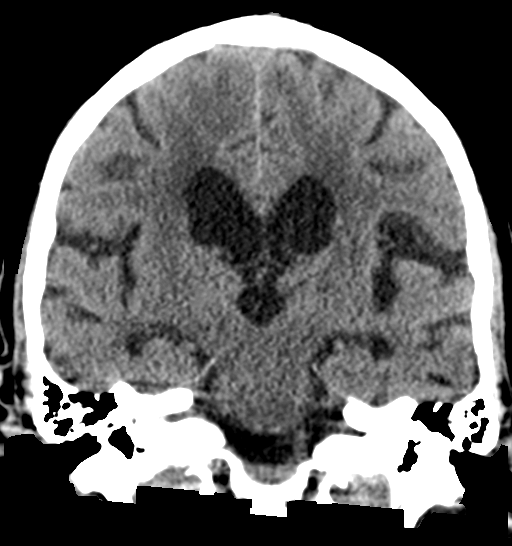

[Series 5: sagittal soft tissue · sagittal · 0.32mm/px · 3 of 50 slices shown]
[im 17/50  brain]
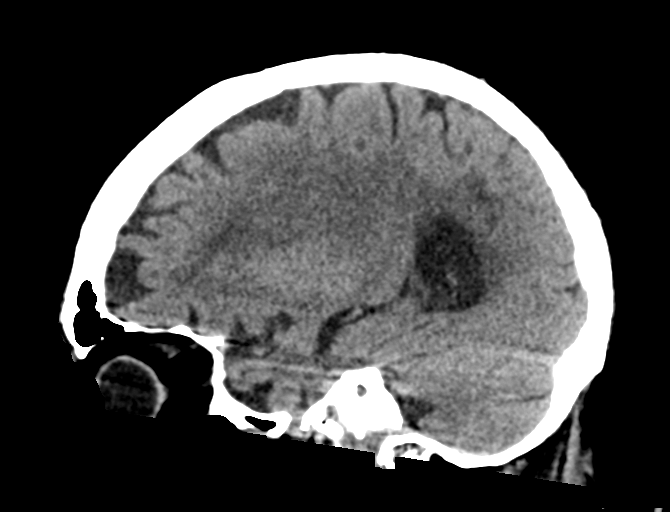
[im 25/50  brain]
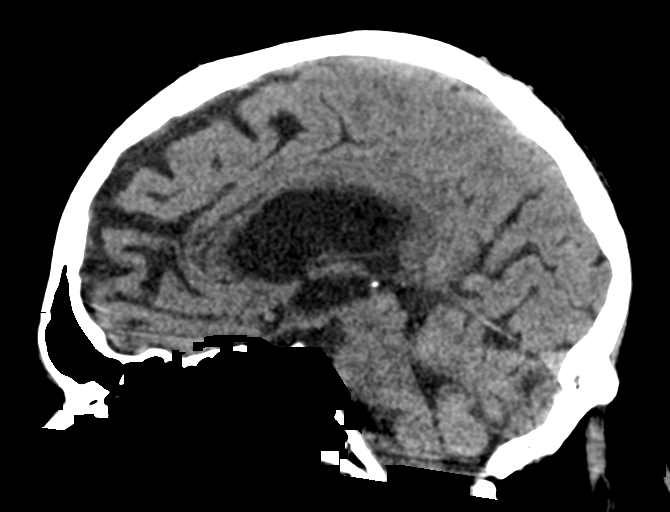
[im 33/50  brain]
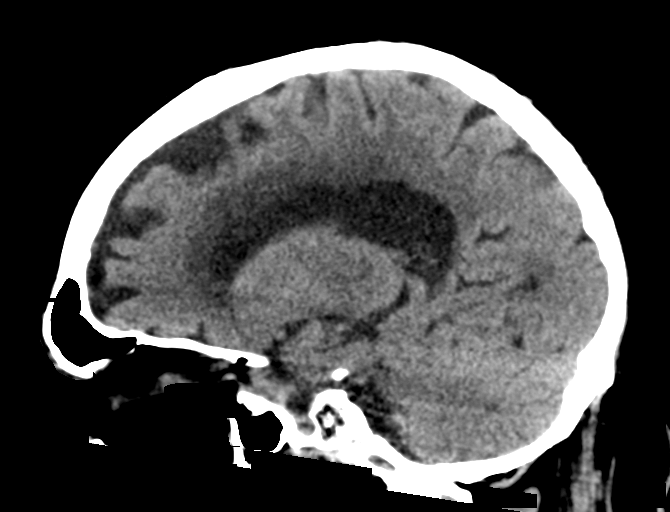

[15 of 47 positions shown; findings below may reference images not displayed]

FINDINGS: Brain: Generalized atrophy, stable from prior exam. Chronic small
vessel ischemia with mild progression. Unchanged small focal right
parietal encephalomalacia with adjacent calcification. Remote
lacunar infarct of left basal ganglia. Remote lacunar infarcts in
the right cerebellum. No hemorrhage or evidence of acute ischemia.
No mass effect or midline shift.

Vascular: Atherosclerosis of skullbase vasculature without
hyperdense vessel or abnormal calcification.

Skull: No skull fracture or focal lesion.

Sinuses/Orbits: Trace fluid in left side of sphenoid sinus.
Remaining paranasal sinuses are well-aerated. Mastoid air cells are
clear. Visualized orbits are unremarkable. Post bilateral cataract
resection.

Other: None.
IMPRESSION: 1.  No acute intracranial abnormality.
2. Stable atrophy. Mild progression of chronic small vessel
ischemia. Remote lacunar infarcts in left basal ganglia and right
cerebellum.

## 2018-06-27 DIAGNOSIS — I441 Atrioventricular block, second degree: Secondary | ICD-10-CM | POA: Diagnosis not present

## 2018-07-06 DIAGNOSIS — Z79899 Other long term (current) drug therapy: Secondary | ICD-10-CM | POA: Diagnosis not present

## 2018-07-06 DIAGNOSIS — R739 Hyperglycemia, unspecified: Secondary | ICD-10-CM | POA: Diagnosis not present

## 2018-07-11 DIAGNOSIS — Z8546 Personal history of malignant neoplasm of prostate: Secondary | ICD-10-CM | POA: Diagnosis not present

## 2018-07-13 DIAGNOSIS — N183 Chronic kidney disease, stage 3 (moderate): Secondary | ICD-10-CM | POA: Diagnosis not present

## 2018-07-13 DIAGNOSIS — I10 Essential (primary) hypertension: Secondary | ICD-10-CM | POA: Diagnosis not present

## 2018-07-13 DIAGNOSIS — D649 Anemia, unspecified: Secondary | ICD-10-CM | POA: Diagnosis not present

## 2018-07-13 DIAGNOSIS — Z79899 Other long term (current) drug therapy: Secondary | ICD-10-CM | POA: Diagnosis not present

## 2018-07-13 DIAGNOSIS — E782 Mixed hyperlipidemia: Secondary | ICD-10-CM | POA: Diagnosis not present

## 2018-07-13 DIAGNOSIS — R739 Hyperglycemia, unspecified: Secondary | ICD-10-CM | POA: Diagnosis not present

## 2018-07-17 DIAGNOSIS — J019 Acute sinusitis, unspecified: Secondary | ICD-10-CM | POA: Diagnosis not present

## 2018-08-15 DIAGNOSIS — M4317 Spondylolisthesis, lumbosacral region: Secondary | ICD-10-CM | POA: Diagnosis not present

## 2018-08-21 DIAGNOSIS — D2262 Melanocytic nevi of left upper limb, including shoulder: Secondary | ICD-10-CM | POA: Diagnosis not present

## 2018-08-21 DIAGNOSIS — L821 Other seborrheic keratosis: Secondary | ICD-10-CM | POA: Diagnosis not present

## 2018-08-21 DIAGNOSIS — L820 Inflamed seborrheic keratosis: Secondary | ICD-10-CM | POA: Diagnosis not present

## 2018-08-21 DIAGNOSIS — D485 Neoplasm of uncertain behavior of skin: Secondary | ICD-10-CM | POA: Diagnosis not present

## 2018-08-21 DIAGNOSIS — D2261 Melanocytic nevi of right upper limb, including shoulder: Secondary | ICD-10-CM | POA: Diagnosis not present

## 2018-08-21 DIAGNOSIS — Z85828 Personal history of other malignant neoplasm of skin: Secondary | ICD-10-CM | POA: Diagnosis not present

## 2018-08-21 DIAGNOSIS — D225 Melanocytic nevi of trunk: Secondary | ICD-10-CM | POA: Diagnosis not present

## 2018-08-21 DIAGNOSIS — D2272 Melanocytic nevi of left lower limb, including hip: Secondary | ICD-10-CM | POA: Diagnosis not present

## 2018-08-21 DIAGNOSIS — D2271 Melanocytic nevi of right lower limb, including hip: Secondary | ICD-10-CM | POA: Diagnosis not present

## 2018-08-21 DIAGNOSIS — L570 Actinic keratosis: Secondary | ICD-10-CM | POA: Diagnosis not present

## 2018-08-21 DIAGNOSIS — Z08 Encounter for follow-up examination after completed treatment for malignant neoplasm: Secondary | ICD-10-CM | POA: Diagnosis not present

## 2018-08-25 DIAGNOSIS — H353132 Nonexudative age-related macular degeneration, bilateral, intermediate dry stage: Secondary | ICD-10-CM | POA: Diagnosis not present

## 2018-09-01 DIAGNOSIS — L570 Actinic keratosis: Secondary | ICD-10-CM | POA: Diagnosis not present

## 2018-10-06 ENCOUNTER — Ambulatory Visit
Admission: RE | Admit: 2018-10-06 | Discharge: 2018-10-06 | Disposition: A | Discharge status: Home / Self Care | Payer: PPO | Source: Ambulatory Visit | Attending: Urgent Care | Admitting: Urgent Care

## 2018-10-06 ENCOUNTER — Ambulatory Visit: Payer: PPO

## 2018-10-06 DIAGNOSIS — C3490 Malignant neoplasm of unspecified part of unspecified bronchus or lung: Secondary | ICD-10-CM | POA: Diagnosis not present

## 2018-10-06 DIAGNOSIS — J984 Other disorders of lung: Secondary | ICD-10-CM | POA: Diagnosis not present

## 2018-10-10 DIAGNOSIS — I10 Essential (primary) hypertension: Secondary | ICD-10-CM | POA: Diagnosis not present

## 2018-10-10 DIAGNOSIS — Z79899 Other long term (current) drug therapy: Secondary | ICD-10-CM | POA: Diagnosis not present

## 2018-10-10 DIAGNOSIS — N183 Chronic kidney disease, stage 3 (moderate): Secondary | ICD-10-CM | POA: Diagnosis not present

## 2018-10-10 DIAGNOSIS — J40 Bronchitis, not specified as acute or chronic: Secondary | ICD-10-CM | POA: Diagnosis not present

## 2018-10-10 DIAGNOSIS — E782 Mixed hyperlipidemia: Secondary | ICD-10-CM | POA: Diagnosis not present

## 2018-10-10 DIAGNOSIS — R739 Hyperglycemia, unspecified: Secondary | ICD-10-CM | POA: Diagnosis not present

## 2018-10-11 ENCOUNTER — Other Ambulatory Visit: Payer: Self-pay

## 2018-10-11 DIAGNOSIS — C3490 Malignant neoplasm of unspecified part of unspecified bronchus or lung: Secondary | ICD-10-CM

## 2018-10-12 ENCOUNTER — Inpatient Hospital Stay (HOSPITAL_BASED_OUTPATIENT_CLINIC_OR_DEPARTMENT_OTHER): Payer: PPO | Admitting: Hematology and Oncology

## 2018-10-12 ENCOUNTER — Inpatient Hospital Stay: Payer: PPO | Attending: Hematology and Oncology

## 2018-10-12 ENCOUNTER — Encounter: Payer: Self-pay | Admitting: Hematology and Oncology

## 2018-10-12 VITALS — BP 126/81 | HR 67 | Temp 97.6°F | Resp 20 | Wt 227.0 lb

## 2018-10-12 DIAGNOSIS — Z87891 Personal history of nicotine dependence: Secondary | ICD-10-CM

## 2018-10-12 DIAGNOSIS — Z79899 Other long term (current) drug therapy: Secondary | ICD-10-CM | POA: Insufficient documentation

## 2018-10-12 DIAGNOSIS — Z8546 Personal history of malignant neoplasm of prostate: Secondary | ICD-10-CM | POA: Insufficient documentation

## 2018-10-12 DIAGNOSIS — Z902 Acquired absence of lung [part of]: Secondary | ICD-10-CM | POA: Insufficient documentation

## 2018-10-12 DIAGNOSIS — C3490 Malignant neoplasm of unspecified part of unspecified bronchus or lung: Secondary | ICD-10-CM

## 2018-10-12 DIAGNOSIS — D696 Thrombocytopenia, unspecified: Secondary | ICD-10-CM

## 2018-10-12 DIAGNOSIS — Z85118 Personal history of other malignant neoplasm of bronchus and lung: Secondary | ICD-10-CM | POA: Diagnosis not present

## 2018-10-12 LAB — CBC WITH DIFFERENTIAL/PLATELET
Abs Immature Granulocytes: 0.03 10*3/uL (ref 0.00–0.07)
Basophils Absolute: 0 10*3/uL (ref 0.0–0.1)
Basophils Relative: 0 %
Eosinophils Absolute: 0.1 10*3/uL (ref 0.0–0.5)
Eosinophils Relative: 2 %
HCT: 40.2 % (ref 39.0–52.0)
Hemoglobin: 13 g/dL (ref 13.0–17.0)
Immature Granulocytes: 0 %
Lymphocytes Relative: 12 %
Lymphs Abs: 0.9 10*3/uL (ref 0.7–4.0)
MCH: 29.3 pg (ref 26.0–34.0)
MCHC: 32.3 g/dL (ref 30.0–36.0)
MCV: 90.7 fL (ref 80.0–100.0)
Monocytes Absolute: 0.8 10*3/uL (ref 0.1–1.0)
Monocytes Relative: 10 %
Neutro Abs: 6.2 10*3/uL (ref 1.7–7.7)
Neutrophils Relative %: 76 %
Platelets: 140 10*3/uL — ABNORMAL LOW (ref 150–400)
RBC: 4.43 MIL/uL (ref 4.22–5.81)
RDW: 14 % (ref 11.5–15.5)
WBC: 8.1 10*3/uL (ref 4.0–10.5)
nRBC: 0 % (ref 0.0–0.2)

## 2018-10-12 LAB — COMPREHENSIVE METABOLIC PANEL
ALT: 16 U/L (ref 0–44)
AST: 16 U/L (ref 15–41)
Albumin: 3.8 g/dL (ref 3.5–5.0)
Alkaline Phosphatase: 63 U/L (ref 38–126)
Anion gap: 8 (ref 5–15)
BUN: 24 mg/dL — ABNORMAL HIGH (ref 8–23)
CO2: 22 mmol/L (ref 22–32)
Calcium: 8.8 mg/dL — ABNORMAL LOW (ref 8.9–10.3)
Chloride: 108 mmol/L (ref 98–111)
Creatinine, Ser: 2.03 mg/dL — ABNORMAL HIGH (ref 0.61–1.24)
GFR calc Af Amer: 36 mL/min — ABNORMAL LOW (ref >60)
GFR calc non Af Amer: 31 mL/min — ABNORMAL LOW (ref >60)
Glucose, Bld: 127 mg/dL — ABNORMAL HIGH (ref 70–99)
Potassium: 3.9 mmol/L (ref 3.5–5.1)
Sodium: 138 mmol/L (ref 135–145)
Total Bilirubin: 0.7 mg/dL (ref 0.3–1.2)
Total Protein: 6.7 g/dL (ref 6.5–8.1)

## 2018-10-12 NOTE — Progress Notes (Signed)
Rick Mcbride:  10/12/2018  Chief Complaint: Rick Mcbride. is a 77 y.o. male with stage I right lower lobe lung cancer (2014) and a history of prostate cancer (2011) who is seen for review of interval CT scan and 1 year assessment.  HPI:  The patient was last seen in the medical oncology clinic on 10/11/2017.  At that time, he felt good except for his back.  He was s/p surgery.  He had vascular stents placed.  He denied any respiratory symptoms. Exam was stable. Creatinine was elevated to 2.10.  Chest CT on 10/06/2018 revealed s/p right lower lobectomy. There were no findings to suggest locally recurrent or metastatic disease.  There was diffuse bronchial wall thickening with moderate centrilobular and paraseptal emphysema; imaging findings suggestive of underlying COPD.  There was aortic atherosclerosis, in addition to left main and 3 vessel coronary artery disease. He is s/p median sternotomy for CABG.  During the interim, he has done well.  He notes being seen in the walk in clinic on [place] for cough and congestion.  He was prescribed doxycycline, Mucinex, and tessalon perles.  He denies any fever.     Past Medical History:  Diagnosis Date  . AAA (abdominal aortic aneurysm) ([place])   . AAA (abdominal aortic aneurysm) without rupture ([place])   . Anemia   . Aneurysm ([place])    abd aortic  . Anxiety   . Atrophic kidney   . Cervical radiculopathy   . Chronic airway obstruction (HCC)    not aware of this  . Chronic kidney disease (CKD), stage III (moderate) (HCC)    followed by Dr. Johnny Bridge  . Chronic tension headaches   . Coronary artery disease   . Coronary atherosclerosis of autologous vein bypass graft   . DDD (degenerative disc disease), lumbar   . Degenerative disc disease, lumbar    with lumbar radiculopathy  . Dyspnea   . Elbow fracture, left   . GERD (gastroesophageal reflux disease)   . H/O adenomatous polyp of colon   . H/O  hemorrhoids   . H/O urticaria   . Headache   . Heart disease   . Hypercholesteremia   . Hyperlipidemia   . 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 Lucky Cowboy  . Lung cancer ([place])   . Lung cancer ([place])   . Meralgia paresthetica   . Meralgia paresthetica   . Neuralgia   . Osteoarthritis   . Osteoarthritis    s/p L knee surgery  . 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 Presence of permanent cardiac pacemaker   . Prostate cancer ([place])   . Second degree AV block    Followed by Dr. Nehemiah Massed  . Sinoatrial node dysfunction (HCC)   . Status post partial lobectomy of lung    bottom right   . Stroke ([place])   . TIA (transient ischemic attack)     Past Surgical History:  Procedure Laterality Date  . CATARACT EXTRACTION    . COLONOSCOPY    . COLONOSCOPY    . COLONOSCOPY WITH PROPOFOL N/A 10/20/2015   Procedure: COLONOSCOPY WITH PROPOFOL;  Surgeon: Manya Silvas, MD;  Location: [facility];  Service: Endoscopy;  Laterality: N/A;  . CORONARY ARTERY BYPASS GRAFT     triple  . coronary atherosclerosis of autologous vein bypass graft    .  EMBOLIZATION Right 12/27/2016   Procedure: Embolization;  Surgeon: Algernon Huxley, MD;  Location: [facility];  Service: Cardiovascular;  Laterality: Right;  . ENDOVASCULAR REPAIR/STENT GRAFT N/A 01/05/2017   Procedure: Endovascular Repair/Stent Graft;  Surgeon: Algernon Huxley, MD;  Location: [facility];  Service: Cardiovascular;  Laterality: N/A;  . EYE SURGERY Bilateral    cataract extraction  . JOINT REPLACEMENT     shoulder and knees  . KNEE ARTHROSCOPY    . LOBECTOMY  01/31/13   RLL w/squamous cell carcinoma lobectomy  . LUNG REMOVAL, PARTIAL  2014   right lower lobe  . PACEMAKER INSERTION    . PACEMAKER INSERTION  12/2012   Dual chanber pacemaker generator  . partial shoulder replacement Right   . POLYPECTOMY    . PROSTATECTOMY    . TOTAL KNEE ARTHROPLASTY  Bilateral   . TOTAL SHOULDER ARTHROPLASTY Left 07/10/2015   Procedure: TOTAL SHOULDER ARTHROPLASTY;  Surgeon: [name], MD;  Location: [facility];  Service: Orthopedics;  Laterality: Left;  . TOTAL SHOULDER REPLACEMENT      Family History  Problem Relation Age of Onset  . Heart attack Mother   . Heart attack Father   . Breast cancer Sister   . Asthma Sister     Social History:  reports that he quit smoking about 14 years ago. He has a 67.50 pack-year smoking history. He has never used smokeless tobacco. He reports that he does not drink alcohol or use drugs.  He has a handicapped son.  He lives in [place]. The patient is accompanied by his wife, Rick Mcbride, today.  Allergies: No Known Allergies  Current Medications: Current Outpatient Medications  Medication Sig Dispense Refill  . acetaminophen (TYLENOL) 500 MG tablet Take 500 mg by mouth every 6 (six) hours as needed.    Marland Kitchen amLODipine (NORVASC) 5 MG tablet Take 1 tablet (5 mg total) by mouth daily. 7 tablet 0  . aspirin EC 81 MG tablet Take 81 mg by mouth daily.     Marland Kitchen atorvastatin (LIPITOR) 40 MG tablet Take 40 mg by mouth at bedtime.     . benzonatate (TESSALON) 100 MG capsule Take by mouth 3 (three) times daily as needed for cough.    . Calcium Carbonate-Vitamin D3 (CALCIUM 600-D) 600-400 MG-UNIT TABS Take 1 tablet by mouth daily.    . carisoprodol (SOMA) 350 MG tablet Take 350 mg by mouth 4 (four) times daily as needed for muscle spasms.    . cetirizine (ZYRTEC) 10 MG tablet Take 10 mg by mouth daily.    . clopidogrel (PLAVIX) 75 MG tablet Take 75 mg by mouth daily.     . clotrimazole-betamethasone (LOTRISONE) cream Apply 1 application topically 2 (two) times daily as needed (rash).    Marland Kitchen docusate sodium (COLACE) 100 MG capsule Take 100 mg by mouth daily.    Marland Kitchen donepezil (ARICEPT) 5 MG tablet Take 5 mg by mouth at bedtime.    Marland Kitchen doxycycline (VIBRAMYCIN) 100 MG capsule     . ferrous sulfate (SLOW FE) 160 (50 FE) MG TBCR SR tablet Take 1  tablet by mouth daily.     . fluticasone (FLONASE) 50 MCG/ACT nasal spray USE 2 SPRAYS IN BOTH NOSTRILS ONCE DAILY    . guaiFENesin (MUCINEX) 600 MG 12 hr tablet Take by mouth 2 (two) times daily.    . Melatonin 10 MG TABS Take by mouth.    . metoprolol succinate (TOPROL-XL) 25 MG 24 hr tablet Take  25 mg by mouth daily.    . Multiple Vitamin (MULTIVITAMIN WITH MINERALS) TABS tablet Take 1 tablet by mouth 2 (two) times daily.    . Multiple Vitamins-Minerals (PRESERVISION/LUTEIN) CAPS Take 1 capsule by mouth 2 (two) times daily.     . Omega-3 Fatty Acids (FISH OIL) 1200 MG CAPS Take 2 capsules by mouth 2 (two) times daily.     Marland Kitchen omeprazole (PRILOSEC) 20 MG capsule Take 20 mg by mouth daily.     . vitamin B-12 (CYANOCOBALAMIN) 1000 MCG tablet Take 1,000 mcg by mouth daily.    Marland Kitchen zolpidem (AMBIEN) 10 MG tablet Take 10 mg by mouth at bedtime as needed for sleep.     . traMADol-acetaminophen (ULTRACET) 37.5-325 MG tablet Take 1 tablet by mouth 2 (two) times daily.      No current facility-administered medications for this visit.     Review of Systems:  GENERAL:  Feels "ok". No fevers, sweats.  Weight down 5 pounds. PERFORMANCE STATUS (ECOG):  1 HEENT:  Runny nose.  No visual changes, sore throat, mouth sores or tenderness. Lungs: No shortness of breath. Cough, improved.  No hemoptysis. Cardiac:  No chest pain, palpitations, orthopnea, or PND. GI:  No nausea, vomiting, diarrhea, constipation, melena or hematochezia. GU:  No urgency, frequency, dysuria, or hematuria. Musculoskeletal:  No back pain.  No joint pain.  No muscle tenderness. Extremities:  No pain or swelling. Skin:  No rashes or skin changes. Neuro:  No headache, numbness or weakness, balance or coordination issues. Endocrine:  No diabetes, thyroid issues, hot flashes or night sweats. Psych:  No mood changes, depression or anxiety. Pain:  No focal pain. Review of systems:  All other systems reviewed and found to be negative.    Physical Exam: Blood pressure 126/81, pulse 67, temperature 97.6 F (36.4 C), temperature source Tympanic, resp. rate 20, weight 227 lb (103 kg), SpO2 95 %. GENERAL:  Well developed, well nourished, gentleman sitting comfortably in the exam room in no acute distress. MENTAL STATUS:  Alert and oriented to person, place and time. HEAD:  Wearing a cap.  Short graying hair.  Male pattern baldness.  Normocephalic, atraumatic, face symmetric, no Cushingoid features. EYES:  Glasses.  Hazel eyes.  Pupils equal round and reactive to light and accomodation.  No conjunctivitis or scleral icterus. ENT:  Oropharynx clear without lesion.  Tongue normal.  Dentures.  Mucous membranes moist.  RESPIRATORY:  Coarse upper airway sounds.  No rales, wheezes or rhonchi. CARDIOVASCULAR:  Regular rate and rhythm without murmur, rub or gallop. ABDOMEN:  Soft, non-tender, with active bowel sounds, and no hepatosplenomegaly.  No masses. SKIN:  No rashes, ulcers or lesions. EXTREMITIES: No edema, no skin discoloration or tenderness.  No palpable cords. LYMPH NODES: No palpable cervical, supraclavicular, axillary or inguinal adenopathy  NEUROLOGICAL: Unremarkable. PSYCH:  Appropriate.    Appointment on 10/12/2018  Component Date Value Ref Range Status  . Sodium 10/12/2018 138  135 - 145 mmol/L Final  . Potassium 10/12/2018 3.9  3.5 - 5.1 mmol/L Final  . Chloride 10/12/2018 108  98 - 111 mmol/L Final  . CO2 10/12/2018 22  22 - 32 mmol/L Final  . Glucose, Bld 10/12/2018 127* 70 - 99 mg/dL Final  . BUN 10/12/2018 24* 8 - 23 mg/dL Final  . Creatinine, Ser 10/12/2018 2.03* 0.61 - 1.24 mg/dL Final  . Calcium 10/12/2018 8.8* 8.9 - 10.3 mg/dL Final  . Total Protein 10/12/2018 6.7  6.5 - 8.1 g/dL Final  . Albumin  10/12/2018 3.8  3.5 - 5.0 g/dL Final  . AST 10/12/2018 16  15 - 41 U/L Final  . ALT 10/12/2018 16  0 - 44 U/L Final  . Alkaline Phosphatase 10/12/2018 63  38 - 126 U/L Final  . Total Bilirubin 10/12/2018 0.7   0.3 - 1.2 mg/dL Final  . GFR calc non Af Amer 10/12/2018 31* >60 mL/min Final  . GFR calc Af Amer 10/12/2018 36* >60 mL/min Final  . Anion gap 10/12/2018 8  5 - 15 Final   Performed at [facility], [address] [place], [place] [address]  . WBC 10/12/2018 8.1  4.0 - 10.5 K/uL Final  . RBC 10/12/2018 4.43  4.22 - 5.81 MIL/uL Final  . Hemoglobin 10/12/2018 13.0  13.0 - 17.0 g/dL Final  . HCT 10/12/2018 40.2  39.0 - 52.0 % Final  . MCV 10/12/2018 90.7  80.0 - 100.0 fL Final  . MCH 10/12/2018 29.3  26.0 - 34.0 pg Final  . MCHC 10/12/2018 32.3  30.0 - 36.0 g/dL Final  . RDW 10/12/2018 14.0  11.5 - 15.5 % Final  . Platelets 10/12/2018 140* 150 - 400 K/uL Final  . nRBC 10/12/2018 0.0  0.0 - 0.2 % Final  . Neutrophils Relative % 10/12/2018 76  % Final  . Neutro Abs 10/12/2018 6.2  1.7 - 7.7 K/uL Final  . Lymphocytes Relative 10/12/2018 12  % Final  . Lymphs Abs 10/12/2018 0.9  0.7 - 4.0 K/uL Final  . Monocytes Relative 10/12/2018 10  % Final  . Monocytes Absolute 10/12/2018 0.8  0.1 - 1.0 K/uL Final  . Eosinophils Relative 10/12/2018 2  % Final  . Eosinophils Absolute 10/12/2018 0.1  0.0 - 0.5 K/uL Final  . Basophils Relative 10/12/2018 0  % Final  . Basophils Absolute 10/12/2018 0.0  0.0 - 0.1 K/uL Final  . Immature Granulocytes 10/12/2018 0  % Final  . Abs Immature Granulocytes 10/12/2018 0.03  0.00 - 0.07 K/uL Final   Performed at [facility], [address] [place], [place] [address]    Assessment:  Rick T Khiem Gargis. is a 77 y.o. male with stage I right lower lobe lung cancer (2014) and a history of prostate cancer (2011).    Chest CT on 11/16/2012 revealed a 1.4 x 1.9 cm subpleural spiculated nodular density.  PET scan on 01/16/2013 revealed abnormal localization within the right lower lobe concerning for underlying malignancy.  There was no mediastinal or hilar abnormal uptake to suggest metastatic disease  He underwent right lower lobe wedge resection on  01/31/2013.  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 The subcarinal lymph node was negative for malignancy.  Right lower lobe lobectomy revealed a focal residual squamous cell carcinoma (0.1 cm) adjacent to the staple line.  There was no pleural invasion. There was no lymphovascular invasion.  There were 3 lobar lymph nodes negative for malignancy. Pathologic stage was T1bN0.  PET scan on 10/24/2014 revealed right lower lobe surgical changes without evidence of hypermetabolic recurrent or metastatic disease. The lingula nodule been present and similar back to 09/24/2013.  He had a 4.5 x 4.3 cm infrarenal abdominal aortic aneurysm extension into the right common iliac artery unchanged.  CXR on 10/06/2016 revealed no active disease was pleural and parenchymal pulmonary scarring on the right.  Dr. Lucky Cowboy is monitoring the aneurysm.  Chest CT on 10/07/2017 revealed no evidence for recurrent or metastatic disease. There was a 4  mm left lower lobe pulmonary nodule.  Chest CT on 10/06/2018 revealed no findings to suggest locally recurrent or metastatic disease.   He has a history of prostate cancer.  He underwent robotic prostatectomy on 10/26/2010 at [facility] in [place], [place].  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  Tumor was limited to a few small foci involving both lobes.  Surgical margins were negative.  Pathologic stage was T2cNx.  PSA was < 0.01 on 04/23/2016.  He is followed by Dr. Alinda Money, urologist, at [facility] in [place].  Colonoscopy on 10/20/2015 revealed multiple polyps in the transverse colon and proximal ascending colon. There were 2 tubular adenomas and 3 hyperplastic polyps. All polyps were negative for high-grade dysplasia or malignancy.  Next colonoscopy is planned in 2019.   He underwent left shoulder arthroplasty on 07/10/2015 by Dr. Milagros Evener for  degenerative joint disease.  Left humeral head resection revealed osteoarthrosis, trilineage hematopoiesis, and no evidence of malignancy.   Symptomatically, he has an improving upper airway infection.  Exam reveals coarse upper airway sounds.  Platelet count is 140,000.  Plan: 1.  Labs today:  CBC with diff, CMP, CEA. 2.  Stage I right lower lobe lung cancer:  Clinically doing well.  Review interval chest CT.  Chest CT personally reviewed.  Agree with radiology interpretation.  CEA 2.5.  Discuss follow-up.  Patient feels comfortable following up with Dr. Doy Hutching as he is > 5 years since diagnosis.  Discuss plan for follow-up low dose chest CT yearly. 3.  Prostate cancer:  Patient notes PSA is not detectable.  Continue monitoring. 4.  Thrombocytopenia:  Platelet count 140,000.  Etiology likely secondary to recent illness.  Anticipate follow-up CBC in next month. 5.  RTC prn.   Lequita Asal, MD  10/12/2018, 2:21 PM

## 2018-10-12 NOTE — Progress Notes (Signed)
Pt in for follow up seen in walk in med clinic 10/10/18 for chest congestion and cough.

## 2018-10-13 LAB — CEA: CEA: 2.5 ng/mL (ref 0.0–4.7)

## 2018-10-16 DIAGNOSIS — E782 Mixed hyperlipidemia: Secondary | ICD-10-CM | POA: Diagnosis not present

## 2018-10-16 DIAGNOSIS — I441 Atrioventricular block, second degree: Secondary | ICD-10-CM | POA: Diagnosis not present

## 2018-10-16 DIAGNOSIS — I25810 Atherosclerosis of coronary artery bypass graft(s) without angina pectoris: Secondary | ICD-10-CM | POA: Diagnosis not present

## 2018-10-16 DIAGNOSIS — I714 Abdominal aortic aneurysm, without rupture: Secondary | ICD-10-CM | POA: Diagnosis not present

## 2018-10-17 DIAGNOSIS — N183 Chronic kidney disease, stage 3 (moderate): Secondary | ICD-10-CM | POA: Diagnosis not present

## 2018-10-17 DIAGNOSIS — C3490 Malignant neoplasm of unspecified part of unspecified bronchus or lung: Secondary | ICD-10-CM | POA: Diagnosis not present

## 2018-10-17 DIAGNOSIS — Z79899 Other long term (current) drug therapy: Secondary | ICD-10-CM | POA: Diagnosis not present

## 2018-10-17 DIAGNOSIS — E782 Mixed hyperlipidemia: Secondary | ICD-10-CM | POA: Diagnosis not present

## 2018-10-17 DIAGNOSIS — I10 Essential (primary) hypertension: Secondary | ICD-10-CM | POA: Diagnosis not present

## 2018-11-03 ENCOUNTER — Ambulatory Visit (INDEPENDENT_AMBULATORY_CARE_PROVIDER_SITE_OTHER): Payer: PPO

## 2018-11-03 ENCOUNTER — Ambulatory Visit (INDEPENDENT_AMBULATORY_CARE_PROVIDER_SITE_OTHER): Payer: PPO | Admitting: Vascular Surgery

## 2018-11-03 ENCOUNTER — Encounter (INDEPENDENT_AMBULATORY_CARE_PROVIDER_SITE_OTHER): Payer: PPO

## 2018-11-03 ENCOUNTER — Encounter (INDEPENDENT_AMBULATORY_CARE_PROVIDER_SITE_OTHER): Payer: Self-pay | Admitting: Vascular Surgery

## 2018-11-03 ENCOUNTER — Other Ambulatory Visit: Payer: Self-pay

## 2018-11-03 VITALS — BP 132/76 | HR 68 | Resp 16 | Ht 71.0 in | Wt 219.0 lb

## 2018-11-03 DIAGNOSIS — I714 Abdominal aortic aneurysm, without rupture, unspecified: Secondary | ICD-10-CM

## 2018-11-03 DIAGNOSIS — Z87891 Personal history of nicotine dependence: Secondary | ICD-10-CM

## 2018-11-03 DIAGNOSIS — M545 Low back pain, unspecified: Secondary | ICD-10-CM

## 2018-11-03 DIAGNOSIS — G8929 Other chronic pain: Secondary | ICD-10-CM

## 2018-11-03 DIAGNOSIS — I10 Essential (primary) hypertension: Secondary | ICD-10-CM

## 2018-11-03 NOTE — Assessment & Plan Note (Signed)
Has had back surgery after his aneurysm repair which may have stabilized his symptoms somewhat, but he still remains symptomatic

## 2018-11-03 NOTE — Patient Instructions (Signed)
Abdominal Aortic Aneurysm    An aneurysm is a bulge in one of the blood vessels that carry blood away from the heart (artery). It happens when blood pushes up against a weak or damaged place in the wall of an artery. An abdominal aortic aneurysm happens in the main artery of the body (aorta).  Some aneurysms may not cause problems. If it grows, it can burst or tear, causing bleeding inside the body. This is an emergency. It needs to be treated right away.  What are the causes?  The exact cause of this condition is not known.  What increases the risk?  The following may make you more likely to get this condition:   Being a male who is 60 years of age or older.   Being white (Caucasian).   Using tobacco.   Having a family history of aneurysms.   Having the following conditions:  ? Hardening of the arteries (arteriosclerosis).  ? Inflammation of the walls of an artery (arteritis).  ? Certain genetic conditions.  ? Being very overweight (obesity).  ? An infection in the wall of the aorta (infectious aortitis).  ? High cholesterol.  ? High blood pressure (hypertension).  What are the signs or symptoms?  Symptoms depend on the size of the aneurysm and how fast it is growing. Most grow slowly and do not cause any symptoms. If symptoms do occur, they may include:   Pain in the belly (abdomen), side, or back.   Feeling full after eating only small amounts of food.   Feeling a throbbing lump in the belly.  Symptoms that the aneurysm has burst (ruptured) include:   Sudden, very bad pain in the belly, side, or back.   Feeling sick to your stomach (nauseous).   Throwing up (vomiting).   Feeling light-headed or passing out.  How is this treated?  Treatment for this condition depends on:   The size of the aneurysm.   How fast it is growing.   Your age.   Your risk of having it burst.  If your aneurysm is smaller than 2 inches (5 cm), your doctor may manage it by:   Checking it often to see if it is getting bigger.  You may have an imaging test (ultrasound) to check it every 3-6 months, every year, or every few years.   Giving you medicines to:  ? Control blood pressure.  ? Treat pain.  ? Fight infection.  If your aneurysm is larger than 2 inches (5 cm), you may need surgery to fix it.  Follow these instructions at home:  Lifestyle   Do not use any products that have nicotine or tobacco in them. This includes cigarettes, e-cigarettes, and chewing tobacco. If you need help quitting, ask your doctor.   Get regular exercise. Ask your doctor what types of exercise are best for you.  Eating and drinking   Eat a heart-healthy diet. This includes eating plenty of:  ? Fresh fruits and vegetables.  ? Whole grains.  ? Low-fat (lean) protein.  ? Low-fat dairy products.   Avoid foods that are high in saturated fat and cholesterol. These foods include red meat and some dairy products.   Do not drink alcohol if:  ? Your doctor tells you not to drink.  ? You are pregnant, may be pregnant, or are planning to become pregnant.   If you drink alcohol:  ? Limit how much you use to:   0-1 drink a day for women.     0-2 drinks a day for men.  ? Be aware of how much alcohol is in your drink. In the [place] one drink equals any of these:   One typical bottle of beer (12 oz).   One-half glass of wine (5 oz).   One shot of hard liquor (1 oz).  General instructions   Take over-the-counter and prescription medicines only as told by your doctor.   Keep your blood pressure within normal limits. Ask your doctor what your blood pressure should be.   Have your blood sugar (glucose) level and cholesterol levels checked regularly. Keep your blood sugar level and cholesterol levels within normal limits.   Avoid heavy lifting and activities that take a lot of effort. Ask your doctor what activities are safe for you.   Keep all follow-up visits as told by your doctor. This is important.  ? Talk to your doctor about regular screenings to see if the  aneurysm is getting bigger.  Contact a doctor if you:   Have pain in your belly, side, or back.   Have a throbbing feeling in your belly.   Have a family history of aneurysms.  Get help right away if you:   Have sudden, bad pain in your belly, side, or back.   Feel sick to your stomach.   Throw up.   Have trouble pooping (constipation).   Have trouble peeing (urinating).   Feel light-headed.   Have a fast heart rate when you stand.   Have sweaty skin that is cold to the touch (clammy).   Have shortness of breath.   Have a fever.  These symptoms may be an emergency. Do not wait to see if the symptoms will go away. Get medical help right away. Call your local emergency services (911 in the [place] Do not drive yourself to the hospital.  Summary   An aneurysm is a bulge in one of the blood vessels that carry blood away from the heart (artery). Some aneurysms may not cause problems.   You may need to have yours checked often. If it grows, it can burst or tear. This causes bleeding inside the body. It needs to be treated right away.   Follow instructions from your doctor about healthy lifestyle changes.   Keep all follow-up visits as told by your doctor. This is important.  This information is not intended to replace advice given to you by your health care provider. Make sure you discuss any questions you have with your health care provider.  Document Released: 02/19/2013 Document Revised: 06/03/2018 Document Reviewed: 06/03/2018  Elsevier Interactive Patient Education  2019 [employer]

## 2018-11-03 NOTE — Progress Notes (Signed)
MRN : [id number]  Rick Mcbride. is a 77 y.o. (February 09, 1941) male who presents with chief complaint of  Chief Complaint  Patient presents with  . AAA  .  History of Present Illness: Patient returns today in follow up of his abdominal aortic aneurysm.  He is not quite 2 years status post endovascular abdominal aortic aneurysm repair.  He has done well from this and has had no major complications following his repair.  He has what appears to be a small type II endoleak which is stable and actually smaller than his last visit and his aneurysm sac remains the same at approximately 5.2 cm in maximal diameter.  Current Outpatient Medications  Medication Sig Dispense Refill  . acetaminophen (TYLENOL) 500 MG tablet Take 500 mg by mouth every 6 (six) hours as needed.    Marland Kitchen amLODipine (NORVASC) 5 MG tablet Take 1 tablet (5 mg total) by mouth daily. 7 tablet 0  . aspirin EC 81 MG tablet Take 81 mg by mouth daily.     Marland Kitchen atorvastatin (LIPITOR) 40 MG tablet Take 40 mg by mouth at bedtime.     . Calcium Carbonate-Vitamin D3 (CALCIUM 600-D) 600-400 MG-UNIT TABS Take 1 tablet by mouth daily.    . carisoprodol (SOMA) 350 MG tablet Take 350 mg by mouth 4 (four) times daily as needed for muscle spasms.    . cetirizine (ZYRTEC) 10 MG tablet Take 10 mg by mouth daily.    . clopidogrel (PLAVIX) 75 MG tablet Take 75 mg by mouth daily.     . clotrimazole-betamethasone (LOTRISONE) cream Apply 1 application topically 2 (two) times daily as needed (rash).    Marland Kitchen docusate sodium (COLACE) 100 MG capsule Take 100 mg by mouth daily.    Marland Kitchen donepezil (ARICEPT) 5 MG tablet Take 5 mg by mouth at bedtime.    Marland Kitchen ezetimibe (ZETIA) 10 MG tablet Take 10 mg by mouth daily.    . ferrous sulfate (SLOW FE) 160 (50 FE) MG TBCR SR tablet Take 1 tablet by mouth daily.     . fluticasone (FLONASE) 50 MCG/ACT nasal spray USE 2 SPRAYS IN BOTH NOSTRILS ONCE DAILY    . Melatonin 10 MG TABS Take by mouth.    . metoprolol succinate  (TOPROL-XL) 25 MG 24 hr tablet Take 25 mg by mouth daily.    . Multiple Vitamin (MULTIVITAMIN WITH MINERALS) TABS tablet Take 1 tablet by mouth 2 (two) times daily.    . Multiple Vitamins-Minerals (PRESERVISION/LUTEIN) CAPS Take 1 capsule by mouth 2 (two) times daily.     Marland Kitchen omeprazole (PRILOSEC) 20 MG capsule Take 20 mg by mouth daily.     . traMADol-acetaminophen (ULTRACET) 37.5-325 MG tablet Take 1 tablet by mouth 2 (two) times daily.     . vitamin B-12 (CYANOCOBALAMIN) 1000 MCG tablet Take 1,000 mcg by mouth daily.    Marland Kitchen zolpidem (AMBIEN) 10 MG tablet Take 10 mg by mouth at bedtime as needed for sleep.     Marland Kitchen guaiFENesin (MUCINEX) 600 MG 12 hr tablet Take by mouth 2 (two) times daily.     No current facility-administered medications for this visit.     Past Medical History:  Diagnosis Date  . AAA (abdominal aortic aneurysm) ([place])   . AAA (abdominal aortic aneurysm) without rupture ([place])   . Anemia   . Aneurysm ([place])    abd aortic  . Anxiety   . Atrophic kidney   . Cervical radiculopathy   . 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 Chronic kidney disease (CKD), stage III (moderate) (HCC)    followed by Dr. Johnny Bridge  . Chronic tension headaches   . Coronary artery disease   . Coronary atherosclerosis of autologous vein bypass graft   . DDD (degenerative disc disease), lumbar   . Degenerative disc disease, lumbar    with lumbar radiculopathy  . Dyspnea   . Elbow fracture, left   . GERD (gastroesophageal reflux disease)   . H/O adenomatous polyp of colon   . H/O hemorrhoids   . H/O urticaria   . Headache   . Heart disease   . Hypercholesteremia   . Hyperlipidemia   . 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 Lucky Cowboy  . Lung cancer ([place])   . Lung cancer ([place])   . Meralgia paresthetica   . Meralgia paresthetica   . Neuralgia   . Osteoarthritis   . Osteoarthritis    s/p L knee surgery  . 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 Presence of permanent cardiac pacemaker   . Prostate cancer ([place])   . Second degree AV block    Followed by Dr. Nehemiah Massed  . Sinoatrial node dysfunction (HCC)   . Status post partial lobectomy of lung    bottom right   . Stroke ([place])   . TIA (transient ischemic attack)     Past Surgical History:  Procedure Laterality Date  . CATARACT EXTRACTION    . COLONOSCOPY    . COLONOSCOPY    . COLONOSCOPY WITH PROPOFOL N/A 10/20/2015   Procedure: COLONOSCOPY WITH PROPOFOL;  Surgeon: Manya Silvas, MD;  Location: [facility] ENDOSCOPY;  Service: Endoscopy;  Laterality: N/A;  . CORONARY ARTERY BYPASS GRAFT     triple  . coronary atherosclerosis of autologous vein bypass graft    . EMBOLIZATION Right 12/27/2016   Procedure: Embolization;  Surgeon: Algernon Huxley, MD;  Location: [facility];  Service: Cardiovascular;  Laterality: Right;  . ENDOVASCULAR REPAIR/STENT GRAFT N/A 01/05/2017   Procedure: Endovascular Repair/Stent Graft;  Surgeon: Algernon Huxley, MD;  Location: [facility];  Service: Cardiovascular;  Laterality: N/A;  . EYE SURGERY Bilateral    cataract extraction  . JOINT REPLACEMENT     shoulder and knees  . KNEE ARTHROSCOPY    . LOBECTOMY  01/31/13   RLL w/squamous cell carcinoma lobectomy  . LUNG REMOVAL, PARTIAL  2014   right lower lobe  . PACEMAKER INSERTION    . PACEMAKER INSERTION  12/2012   Dual chanber pacemaker generator  . partial shoulder replacement Right   . POLYPECTOMY    . PROSTATECTOMY    . TOTAL KNEE ARTHROPLASTY Bilateral   . TOTAL SHOULDER ARTHROPLASTY Left 07/10/2015   Procedure: TOTAL SHOULDER ARTHROPLASTY;  Surgeon: [name], MD;  Location: [facility] ORS;  Service: Orthopedics;  Laterality: Left;  . TOTAL SHOULDER REPLACEMENT      Social History  Substance Use Topics  . Smoking status: Former Smoker    Packs/day: 1.50    Years: 45.00    Quit date: 04/07/2004  . Smokeless tobacco: Never Used  .  Alcohol use No    Family History      Family History  Problem Relation Age of Onset  . Heart attack Mother   . Heart attack Father   . Breast cancer Sister   .  Asthma Sister     No Known Allergies   REVIEW OF SYSTEMS (Negative unless checked)  Constitutional: [] ?Weight loss  [] ?Fever  [] ?Chills Cardiac: [] ?Chest pain   [] ?Chest pressure   [] ?Palpitations   [] ?Shortness of breath when laying flat   [] ?Shortness of breath at rest   [] ?Shortness of breath with exertion. Vascular:  [] ?Pain in legs with walking   [] ?Pain in legs at rest   [] ?Pain in legs when laying flat   [x] ?Claudication   [] ?Pain in feet when walking  [] ?Pain in feet at rest  [] ?Pain in feet when laying flat   [] ?History of DVT   [] ?Phlebitis   [] ?Swelling in legs   [] ?Varicose veins   [] ?Non-healing ulcers Pulmonary:   [] ?Uses home oxygen   [] ?Productive cough   [] ?Hemoptysis   [] ?Wheeze  [] ?COPD   [] ?Asthma Neurologic:  [] ?Dizziness  [] ?Blackouts   [] ?Seizures   [] ?History of stroke   [] ?History of TIA  [] ?Aphasia   [] ?Temporary blindness   [] ?Dysphagia   [] ?Weakness or numbness in arms   [] ?Weakness or numbness in legs Musculoskeletal:  [x] ?Arthritis   [] ?Joint swelling   [] ?Joint pain   [x] ?Low back pain Hematologic:  [] ?Easy bruising  [] ?Easy bleeding   [] ?Hypercoagulable state   [] ?Anemic   Gastrointestinal:  [] ?Blood in stool   [] ?Vomiting blood  [] ?Gastroesophageal reflux/heartburn   [] ?Abdominal pain Genitourinary:  [] ?Chronic kidney disease   [] ?Difficult urination  [x] ?Frequent urination  [] ?Burning with urination   [] ?Hematuria Skin:  [] ?Rashes   [] ?Ulcers   [] ?Wounds Psychological:  [] ?History of anxiety   [] ? History of major depression.   Physical Examination  BP 132/76 (BP Location: Right Arm, Patient Position: Sitting)   Pulse 68   Resp 16   Ht 5\' 11"  (1.803 m)   Wt 219 lb (99.3 kg)   BMI 30.54 kg/m  Gen:  WD/WN, NAD Head: [place]/AT, No temporalis wasting. Ear/Nose/Throat: Hearing  grossly intact, nares w/o erythema or drainage Eyes: Conjunctiva clear. Sclera non-icteric Neck: Supple.  Trachea midline Pulmonary:  Good air movement, no use of accessory muscles.  Cardiac: RRR, no JVD Vascular:  Vessel Right Left  Radial Palpable Palpable                          PT Palpable Palpable  DP Palpable Palpable   Gastrointestinal: soft, non-tender/non-distended.  Musculoskeletal: M/S 5/5 throughout.  No deformity or atrophy. No edema. Neurologic: Sensation grossly intact in extremities.  Symmetrical.  Speech is fluent.  Psychiatric: Judgment intact, Mood & affect appropriate for pt's clinical situation. Dermatologic: No rashes or ulcers noted.  No cellulitis or open wounds.       Labs Recent Results (from the past 2160 hour(s))  Comprehensive metabolic panel     Status: Abnormal   Collection Time: 10/12/18  1:41 PM  Result Value Ref Range   Sodium 138 135 - 145 mmol/L   Potassium 3.9 3.5 - 5.1 mmol/L   Chloride 108 98 - 111 mmol/L   CO2 22 22 - 32 mmol/L   Glucose, Bld 127 (H) 70 - 99 mg/dL   BUN 24 (H) 8 - 23 mg/dL   Creatinine, Ser 2.03 (H) 0.61 - 1.24 mg/dL   Calcium 8.8 (L) 8.9 - 10.3 mg/dL   Total Protein 6.7 6.5 - 8.1 g/dL   Albumin 3.8 3.5 - 5.0 g/dL   AST 16 15 - 41 U/L   ALT 16 0 - 44 U/L   Alkaline Phosphatase 63 38 -  126 U/L   Total Bilirubin 0.7 0.3 - 1.2 mg/dL   GFR calc non Af Amer 31 (L) >60 mL/min   GFR calc Af Amer 36 (L) >60 mL/min   Anion gap 8 5 - 15    Comment: Performed at [facility], [address] [place], [place] [address]  CEA     Status: None   Collection Time: 10/12/18  1:41 PM  Result Value Ref Range   CEA 2.5 0.0 - 4.7 ng/mL    Comment: (NOTE)                             Nonsmokers          <3.9                             Smokers             <5.6 [employer] Electrochemiluminescence Immunoassay (ECLIA) Values obtained with different assay methods or kits cannot be used interchangeably.  Results  cannot be interpreted as absolute evidence of the presence or absence of malignant disease. Performed At: [facility] [place], [place] [id number] Rush Farmer MD BZ:[id number]   CBC with Differential/Platelet     Status: Abnormal   Collection Time: 10/12/18  1:41 PM  Result Value Ref Range   WBC 8.1 4.0 - 10.5 K/uL   RBC 4.43 4.22 - 5.81 MIL/uL   Hemoglobin 13.0 13.0 - 17.0 g/dL   HCT 40.2 39.0 - 52.0 %   MCV 90.7 80.0 - 100.0 fL   MCH 29.3 26.0 - 34.0 pg   MCHC 32.3 30.0 - 36.0 g/dL   RDW 14.0 11.5 - 15.5 %   Platelets 140 (L) 150 - 400 K/uL   nRBC 0.0 0.0 - 0.2 %   Neutrophils Relative % 76 %   Neutro Abs 6.2 1.7 - 7.7 K/uL   Lymphocytes Relative 12 %   Lymphs Abs 0.9 0.7 - 4.0 K/uL   Monocytes Relative 10 %   Monocytes Absolute 0.8 0.1 - 1.0 K/uL   Eosinophils Relative 2 %   Eosinophils Absolute 0.1 0.0 - 0.5 K/uL   Basophils Relative 0 %   Basophils Absolute 0.0 0.0 - 0.1 K/uL   Immature Granulocytes 0 %   Abs Immature Granulocytes 0.03 0.00 - 0.07 K/uL    Comment: Performed at [facility], [address] [place], [place] [address]    Radiology Ct Chest Wo Contrast  Result Date: 10/06/2018 CLINICAL DATA:  77 year old male with history of lung cancer status post right lower lobectomy. Evaluate for recurrent or metastatic disease. EXAM: CT CHEST WITHOUT CONTRAST TECHNIQUE: Multidetector CT imaging of the chest was performed following the standard protocol without IV contrast. COMPARISON:  Chest CT 10/07/2017. FINDINGS: Cardiovascular: Heart size is borderline enlarged. There is no significant pericardial fluid, thickening or pericardial calcification. There is aortic atherosclerosis, as well as atherosclerosis of the great vessels of the mediastinum and the coronary arteries, including calcified atherosclerotic plaque in the left main, left anterior descending, left circumflex and right coronary arteries. Status post median sternotomy for  CABG. Mediastinum/Nodes: No pathologically enlarged mediastinal or hilar lymph nodes. Please note that accurate exclusion of hilar adenopathy is limited on noncontrast CT scans. Esophagus is unremarkable in appearance. No axillary lymphadenopathy. Lungs/Pleura: Status post right lower lobectomy. Compensatory hyperexpansion of the right middle and upper lobes. 4 mm  left lower lobe nodule (axial image 101 of series 3), unchanged dating back to 09/24/2013, definitively benign. No other larger more suspicious appearing pulmonary nodules or masses are noted. No acute consolidative airspace disease. No pleural effusions. Mild diffuse bronchial wall thickening with moderate centrilobular and paraseptal emphysema. Upper Abdomen: Aortic atherosclerosis. Musculoskeletal: Median sternotomy wires. Status post bilateral shoulder arthroplasty there are no aggressive appearing lytic or blastic lesions noted in the visualized portions of the skeleton. IMPRESSION: 1. Status post right lower lobectomy. No findings to suggest locally recurrent or metastatic disease. 2. Diffuse bronchial wall thickening with moderate centrilobular and paraseptal emphysema; imaging findings suggestive of underlying COPD. 3. Aortic atherosclerosis, in addition to left main and 3 vessel coronary artery disease. Status post median sternotomy for CABG. Aortic Atherosclerosis (ICD10-I70.0) and Emphysema (ICD10-J43.9). Electronically Signed   By: Vinnie Langton M.D.   On: 10/06/2018 13:35    Assessment/Plan  Low back pain Has had back surgery after his aneurysm repair which may have stabilized his symptoms somewhat, but he still remains symptomatic  Essential (primary) hypertension blood pressure control important in reducing the progression of atherosclerotic disease and aneurysmal degeneration. On appropriate oral medications.   AAA (abdominal aortic aneurysm) without rupture ([place]) He has what appears to be a small type II endoleak which is  stable and actually smaller than his last visit and his aneurysm sac remains the same at approximately 5.2 cm in maximal diameter. Overall doing well.  We will continue annual surveillance follow-ups with duplex at this point.  Should he have any issues or problems in the interim, he will contact us.    [name], MD  11/03/2018 9:50 AM    This note was created with Dragon medical transcription system.  Any errors from dictation are purely unintentional

## 2018-11-03 NOTE — Assessment & Plan Note (Signed)
blood pressure control important in reducing the progression of atherosclerotic disease and aneurysmal degeneration. On appropriate oral medications.  

## 2018-11-03 NOTE — Assessment & Plan Note (Signed)
He has what appears to be a small type II endoleak which is stable and actually smaller than his last visit and his aneurysm sac remains the same at approximately 5.2 cm in maximal diameter. Overall doing well.  We will continue annual surveillance follow-ups with duplex at this point.  Should he have any issues or problems in the interim, he will contact us.

## 2018-11-09 DIAGNOSIS — I129 Hypertensive chronic kidney disease with stage 1 through stage 4 chronic kidney disease, or unspecified chronic kidney disease: Secondary | ICD-10-CM | POA: Diagnosis not present

## 2018-11-09 DIAGNOSIS — N2581 Secondary hyperparathyroidism of renal origin: Secondary | ICD-10-CM | POA: Diagnosis not present

## 2018-11-09 DIAGNOSIS — N183 Chronic kidney disease, stage 3 (moderate): Secondary | ICD-10-CM | POA: Diagnosis not present

## 2018-11-09 DIAGNOSIS — D631 Anemia in chronic kidney disease: Secondary | ICD-10-CM | POA: Diagnosis not present

## 2018-11-13 DIAGNOSIS — N2581 Secondary hyperparathyroidism of renal origin: Secondary | ICD-10-CM | POA: Diagnosis not present

## 2018-11-13 DIAGNOSIS — I129 Hypertensive chronic kidney disease with stage 1 through stage 4 chronic kidney disease, or unspecified chronic kidney disease: Secondary | ICD-10-CM | POA: Diagnosis not present

## 2018-11-13 DIAGNOSIS — R809 Proteinuria, unspecified: Secondary | ICD-10-CM | POA: Diagnosis not present

## 2018-11-13 DIAGNOSIS — D631 Anemia in chronic kidney disease: Secondary | ICD-10-CM | POA: Diagnosis not present

## 2018-11-13 DIAGNOSIS — N183 Chronic kidney disease, stage 3 (moderate): Secondary | ICD-10-CM | POA: Diagnosis not present

## 2018-11-30 DIAGNOSIS — I129 Hypertensive chronic kidney disease with stage 1 through stage 4 chronic kidney disease, or unspecified chronic kidney disease: Secondary | ICD-10-CM | POA: Diagnosis not present

## 2018-11-30 DIAGNOSIS — N183 Chronic kidney disease, stage 3 (moderate): Secondary | ICD-10-CM | POA: Diagnosis not present

## 2018-11-30 DIAGNOSIS — N2889 Other specified disorders of kidney and ureter: Secondary | ICD-10-CM | POA: Diagnosis not present

## 2018-11-30 DIAGNOSIS — D631 Anemia in chronic kidney disease: Secondary | ICD-10-CM | POA: Diagnosis not present

## 2018-12-25 DIAGNOSIS — Z96611 Presence of right artificial shoulder joint: Secondary | ICD-10-CM | POA: Diagnosis not present

## 2018-12-25 DIAGNOSIS — M19011 Primary osteoarthritis, right shoulder: Secondary | ICD-10-CM | POA: Diagnosis not present

## 2018-12-28 DIAGNOSIS — I441 Atrioventricular block, second degree: Secondary | ICD-10-CM | POA: Diagnosis not present

## 2019-01-10 DIAGNOSIS — E782 Mixed hyperlipidemia: Secondary | ICD-10-CM | POA: Diagnosis not present

## 2019-01-10 DIAGNOSIS — Z79899 Other long term (current) drug therapy: Secondary | ICD-10-CM | POA: Diagnosis not present

## 2019-01-10 DIAGNOSIS — I10 Essential (primary) hypertension: Secondary | ICD-10-CM | POA: Diagnosis not present

## 2019-01-17 DIAGNOSIS — N183 Chronic kidney disease, stage 3 (moderate): Secondary | ICD-10-CM | POA: Diagnosis not present

## 2019-01-17 DIAGNOSIS — E782 Mixed hyperlipidemia: Secondary | ICD-10-CM | POA: Diagnosis not present

## 2019-01-17 DIAGNOSIS — I10 Essential (primary) hypertension: Secondary | ICD-10-CM | POA: Diagnosis not present

## 2019-01-17 DIAGNOSIS — I25810 Atherosclerosis of coronary artery bypass graft(s) without angina pectoris: Secondary | ICD-10-CM | POA: Diagnosis not present

## 2019-01-17 DIAGNOSIS — Z0000 Encounter for general adult medical examination without abnormal findings: Secondary | ICD-10-CM | POA: Diagnosis not present

## 2019-01-22 ENCOUNTER — Inpatient Hospital Stay: Admission: RE | Admit: 2019-01-22 | Payer: PPO | Source: Ambulatory Visit

## 2019-01-30 ENCOUNTER — Encounter: Admission: RE | Payer: Self-pay | Source: Home / Self Care

## 2019-01-30 ENCOUNTER — Inpatient Hospital Stay: Admission: RE | Admit: 2019-01-30 | Payer: PPO | Source: Home / Self Care | Admitting: Surgery

## 2019-01-30 SURGERY — ARTHROPLASTY, SHOULDER, TOTAL, REVERSE
Anesthesia: Choice | Laterality: Right

## 2019-02-13 DIAGNOSIS — Z7901 Long term (current) use of anticoagulants: Secondary | ICD-10-CM | POA: Diagnosis not present

## 2019-02-13 DIAGNOSIS — M5137 Other intervertebral disc degeneration, lumbosacral region: Secondary | ICD-10-CM | POA: Diagnosis not present

## 2019-02-13 DIAGNOSIS — Z86711 Personal history of pulmonary embolism: Secondary | ICD-10-CM | POA: Diagnosis not present

## 2019-02-19 DIAGNOSIS — N183 Chronic kidney disease, stage 3 (moderate): Secondary | ICD-10-CM | POA: Diagnosis not present

## 2019-02-19 DIAGNOSIS — Z951 Presence of aortocoronary bypass graft: Secondary | ICD-10-CM | POA: Diagnosis not present

## 2019-02-19 DIAGNOSIS — Z902 Acquired absence of lung [part of]: Secondary | ICD-10-CM | POA: Diagnosis not present

## 2019-02-19 DIAGNOSIS — Z79891 Long term (current) use of opiate analgesic: Secondary | ICD-10-CM | POA: Diagnosis not present

## 2019-02-19 DIAGNOSIS — Z8673 Personal history of transient ischemic attack (TIA), and cerebral infarction without residual deficits: Secondary | ICD-10-CM | POA: Diagnosis not present

## 2019-02-19 DIAGNOSIS — Z96612 Presence of left artificial shoulder joint: Secondary | ICD-10-CM | POA: Diagnosis not present

## 2019-02-19 DIAGNOSIS — I129 Hypertensive chronic kidney disease with stage 1 through stage 4 chronic kidney disease, or unspecified chronic kidney disease: Secondary | ICD-10-CM | POA: Diagnosis not present

## 2019-02-19 DIAGNOSIS — I714 Abdominal aortic aneurysm, without rupture: Secondary | ICD-10-CM | POA: Diagnosis not present

## 2019-02-19 DIAGNOSIS — Z7902 Long term (current) use of antithrombotics/antiplatelets: Secondary | ICD-10-CM | POA: Diagnosis not present

## 2019-02-19 DIAGNOSIS — K219 Gastro-esophageal reflux disease without esophagitis: Secondary | ICD-10-CM | POA: Diagnosis not present

## 2019-02-19 DIAGNOSIS — Z8546 Personal history of malignant neoplasm of prostate: Secondary | ICD-10-CM | POA: Diagnosis not present

## 2019-02-19 DIAGNOSIS — E782 Mixed hyperlipidemia: Secondary | ICD-10-CM | POA: Diagnosis not present

## 2019-02-19 DIAGNOSIS — Z96653 Presence of artificial knee joint, bilateral: Secondary | ICD-10-CM | POA: Diagnosis not present

## 2019-02-19 DIAGNOSIS — Z96611 Presence of right artificial shoulder joint: Secondary | ICD-10-CM | POA: Diagnosis not present

## 2019-02-19 DIAGNOSIS — I25729 Atherosclerosis of autologous artery coronary artery bypass graft(s) with unspecified angina pectoris: Secondary | ICD-10-CM | POA: Diagnosis not present

## 2019-02-19 DIAGNOSIS — M5137 Other intervertebral disc degeneration, lumbosacral region: Secondary | ICD-10-CM | POA: Diagnosis not present

## 2019-02-19 DIAGNOSIS — Z85118 Personal history of other malignant neoplasm of bronchus and lung: Secondary | ICD-10-CM | POA: Diagnosis not present

## 2019-03-06 DIAGNOSIS — N183 Chronic kidney disease, stage 3 (moderate): Secondary | ICD-10-CM | POA: Diagnosis not present

## 2019-03-06 DIAGNOSIS — I25729 Atherosclerosis of autologous artery coronary artery bypass graft(s) with unspecified angina pectoris: Secondary | ICD-10-CM | POA: Diagnosis not present

## 2019-03-06 DIAGNOSIS — K219 Gastro-esophageal reflux disease without esophagitis: Secondary | ICD-10-CM | POA: Diagnosis not present

## 2019-03-06 DIAGNOSIS — M5137 Other intervertebral disc degeneration, lumbosacral region: Secondary | ICD-10-CM | POA: Diagnosis not present

## 2019-03-06 DIAGNOSIS — I129 Hypertensive chronic kidney disease with stage 1 through stage 4 chronic kidney disease, or unspecified chronic kidney disease: Secondary | ICD-10-CM | POA: Diagnosis not present

## 2019-03-06 DIAGNOSIS — Z85118 Personal history of other malignant neoplasm of bronchus and lung: Secondary | ICD-10-CM | POA: Diagnosis not present

## 2019-03-06 DIAGNOSIS — E782 Mixed hyperlipidemia: Secondary | ICD-10-CM | POA: Diagnosis not present

## 2019-03-06 DIAGNOSIS — I714 Abdominal aortic aneurysm, without rupture: Secondary | ICD-10-CM | POA: Diagnosis not present

## 2019-03-15 DIAGNOSIS — Z7902 Long term (current) use of antithrombotics/antiplatelets: Secondary | ICD-10-CM | POA: Diagnosis not present

## 2019-03-15 DIAGNOSIS — I25729 Atherosclerosis of autologous artery coronary artery bypass graft(s) with unspecified angina pectoris: Secondary | ICD-10-CM | POA: Diagnosis not present

## 2019-03-15 DIAGNOSIS — Z96653 Presence of artificial knee joint, bilateral: Secondary | ICD-10-CM | POA: Diagnosis not present

## 2019-03-15 DIAGNOSIS — N183 Chronic kidney disease, stage 3 (moderate): Secondary | ICD-10-CM | POA: Diagnosis not present

## 2019-03-15 DIAGNOSIS — Z79891 Long term (current) use of opiate analgesic: Secondary | ICD-10-CM | POA: Diagnosis not present

## 2019-03-15 DIAGNOSIS — Z96611 Presence of right artificial shoulder joint: Secondary | ICD-10-CM | POA: Diagnosis not present

## 2019-03-15 DIAGNOSIS — M5137 Other intervertebral disc degeneration, lumbosacral region: Secondary | ICD-10-CM | POA: Diagnosis not present

## 2019-03-15 DIAGNOSIS — I714 Abdominal aortic aneurysm, without rupture: Secondary | ICD-10-CM | POA: Diagnosis not present

## 2019-03-15 DIAGNOSIS — Z85118 Personal history of other malignant neoplasm of bronchus and lung: Secondary | ICD-10-CM | POA: Diagnosis not present

## 2019-03-15 DIAGNOSIS — Z902 Acquired absence of lung [part of]: Secondary | ICD-10-CM | POA: Diagnosis not present

## 2019-03-15 DIAGNOSIS — Z8546 Personal history of malignant neoplasm of prostate: Secondary | ICD-10-CM | POA: Diagnosis not present

## 2019-03-15 DIAGNOSIS — Z8673 Personal history of transient ischemic attack (TIA), and cerebral infarction without residual deficits: Secondary | ICD-10-CM | POA: Diagnosis not present

## 2019-03-15 DIAGNOSIS — Z951 Presence of aortocoronary bypass graft: Secondary | ICD-10-CM | POA: Diagnosis not present

## 2019-03-15 DIAGNOSIS — Z96612 Presence of left artificial shoulder joint: Secondary | ICD-10-CM | POA: Diagnosis not present

## 2019-03-15 DIAGNOSIS — E782 Mixed hyperlipidemia: Secondary | ICD-10-CM | POA: Diagnosis not present

## 2019-03-15 DIAGNOSIS — I129 Hypertensive chronic kidney disease with stage 1 through stage 4 chronic kidney disease, or unspecified chronic kidney disease: Secondary | ICD-10-CM | POA: Diagnosis not present

## 2019-03-15 DIAGNOSIS — K219 Gastro-esophageal reflux disease without esophagitis: Secondary | ICD-10-CM | POA: Diagnosis not present

## 2019-04-09 DIAGNOSIS — E7800 Pure hypercholesterolemia, unspecified: Secondary | ICD-10-CM | POA: Diagnosis not present

## 2019-04-09 DIAGNOSIS — I714 Abdominal aortic aneurysm, without rupture: Secondary | ICD-10-CM | POA: Diagnosis not present

## 2019-04-09 DIAGNOSIS — I25810 Atherosclerosis of coronary artery bypass graft(s) without angina pectoris: Secondary | ICD-10-CM | POA: Diagnosis not present

## 2019-04-09 DIAGNOSIS — I10 Essential (primary) hypertension: Secondary | ICD-10-CM | POA: Diagnosis not present

## 2019-04-17 DIAGNOSIS — I441 Atrioventricular block, second degree: Secondary | ICD-10-CM | POA: Diagnosis not present

## 2019-04-19 DIAGNOSIS — R739 Hyperglycemia, unspecified: Secondary | ICD-10-CM | POA: Diagnosis not present

## 2019-04-19 DIAGNOSIS — R8290 Unspecified abnormal findings in urine: Secondary | ICD-10-CM | POA: Diagnosis not present

## 2019-04-19 DIAGNOSIS — Z125 Encounter for screening for malignant neoplasm of prostate: Secondary | ICD-10-CM | POA: Diagnosis not present

## 2019-04-19 DIAGNOSIS — I25810 Atherosclerosis of coronary artery bypass graft(s) without angina pectoris: Secondary | ICD-10-CM | POA: Diagnosis not present

## 2019-04-19 DIAGNOSIS — Z872 Personal history of diseases of the skin and subcutaneous tissue: Secondary | ICD-10-CM | POA: Diagnosis not present

## 2019-04-19 DIAGNOSIS — I10 Essential (primary) hypertension: Secondary | ICD-10-CM | POA: Diagnosis not present

## 2019-04-19 DIAGNOSIS — E782 Mixed hyperlipidemia: Secondary | ICD-10-CM | POA: Diagnosis not present

## 2019-04-19 DIAGNOSIS — N183 Chronic kidney disease, stage 3 (moderate): Secondary | ICD-10-CM | POA: Diagnosis not present

## 2019-04-19 DIAGNOSIS — Z79899 Other long term (current) drug therapy: Secondary | ICD-10-CM | POA: Diagnosis not present

## 2019-05-16 DIAGNOSIS — I129 Hypertensive chronic kidney disease with stage 1 through stage 4 chronic kidney disease, or unspecified chronic kidney disease: Secondary | ICD-10-CM | POA: Diagnosis not present

## 2019-05-16 DIAGNOSIS — N2581 Secondary hyperparathyroidism of renal origin: Secondary | ICD-10-CM | POA: Diagnosis not present

## 2019-05-16 DIAGNOSIS — N183 Chronic kidney disease, stage 3 (moderate): Secondary | ICD-10-CM | POA: Diagnosis not present

## 2019-05-16 DIAGNOSIS — N2889 Other specified disorders of kidney and ureter: Secondary | ICD-10-CM | POA: Diagnosis not present

## 2019-05-21 DIAGNOSIS — I129 Hypertensive chronic kidney disease with stage 1 through stage 4 chronic kidney disease, or unspecified chronic kidney disease: Secondary | ICD-10-CM | POA: Diagnosis not present

## 2019-05-21 DIAGNOSIS — N2581 Secondary hyperparathyroidism of renal origin: Secondary | ICD-10-CM | POA: Diagnosis not present

## 2019-05-21 DIAGNOSIS — N184 Chronic kidney disease, stage 4 (severe): Secondary | ICD-10-CM | POA: Diagnosis not present

## 2019-05-21 DIAGNOSIS — R809 Proteinuria, unspecified: Secondary | ICD-10-CM | POA: Diagnosis not present

## 2019-06-05 DIAGNOSIS — Z6829 Body mass index (BMI) 29.0-29.9, adult: Secondary | ICD-10-CM | POA: Diagnosis not present

## 2019-06-05 DIAGNOSIS — I10 Essential (primary) hypertension: Secondary | ICD-10-CM | POA: Diagnosis not present

## 2019-06-05 DIAGNOSIS — M4317 Spondylolisthesis, lumbosacral region: Secondary | ICD-10-CM | POA: Diagnosis not present

## 2019-06-12 DIAGNOSIS — H353132 Nonexudative age-related macular degeneration, bilateral, intermediate dry stage: Secondary | ICD-10-CM | POA: Diagnosis not present

## 2019-07-10 DIAGNOSIS — C61 Malignant neoplasm of prostate: Secondary | ICD-10-CM | POA: Diagnosis not present

## 2019-07-10 DIAGNOSIS — R8271 Bacteriuria: Secondary | ICD-10-CM | POA: Diagnosis not present

## 2019-07-10 DIAGNOSIS — R3121 Asymptomatic microscopic hematuria: Secondary | ICD-10-CM | POA: Diagnosis not present

## 2019-08-22 DIAGNOSIS — D2271 Melanocytic nevi of right lower limb, including hip: Secondary | ICD-10-CM | POA: Diagnosis not present

## 2019-08-22 DIAGNOSIS — Z85828 Personal history of other malignant neoplasm of skin: Secondary | ICD-10-CM | POA: Diagnosis not present

## 2019-08-22 DIAGNOSIS — D2272 Melanocytic nevi of left lower limb, including hip: Secondary | ICD-10-CM | POA: Diagnosis not present

## 2019-08-22 DIAGNOSIS — L821 Other seborrheic keratosis: Secondary | ICD-10-CM | POA: Diagnosis not present

## 2019-08-22 DIAGNOSIS — Z08 Encounter for follow-up examination after completed treatment for malignant neoplasm: Secondary | ICD-10-CM | POA: Diagnosis not present

## 2019-09-05 DIAGNOSIS — E782 Mixed hyperlipidemia: Secondary | ICD-10-CM | POA: Diagnosis not present

## 2019-09-05 DIAGNOSIS — G894 Chronic pain syndrome: Secondary | ICD-10-CM | POA: Diagnosis not present

## 2019-09-05 DIAGNOSIS — Z0000 Encounter for general adult medical examination without abnormal findings: Secondary | ICD-10-CM | POA: Diagnosis not present

## 2019-09-05 DIAGNOSIS — I714 Abdominal aortic aneurysm, without rupture: Secondary | ICD-10-CM | POA: Diagnosis not present

## 2019-09-05 DIAGNOSIS — I10 Essential (primary) hypertension: Secondary | ICD-10-CM | POA: Diagnosis not present

## 2019-09-05 DIAGNOSIS — N1832 Chronic kidney disease, stage 3b: Secondary | ICD-10-CM | POA: Diagnosis not present

## 2019-09-05 DIAGNOSIS — R739 Hyperglycemia, unspecified: Secondary | ICD-10-CM | POA: Diagnosis not present

## 2019-09-05 DIAGNOSIS — M5137 Other intervertebral disc degeneration, lumbosacral region: Secondary | ICD-10-CM | POA: Diagnosis not present

## 2019-09-05 DIAGNOSIS — C3490 Malignant neoplasm of unspecified part of unspecified bronchus or lung: Secondary | ICD-10-CM | POA: Diagnosis not present

## 2019-09-05 DIAGNOSIS — I25810 Atherosclerosis of coronary artery bypass graft(s) without angina pectoris: Secondary | ICD-10-CM | POA: Diagnosis not present

## 2019-09-05 DIAGNOSIS — Z79899 Other long term (current) drug therapy: Secondary | ICD-10-CM | POA: Diagnosis not present

## 2019-09-06 ENCOUNTER — Other Ambulatory Visit: Payer: Self-pay | Admitting: Internal Medicine

## 2019-09-06 DIAGNOSIS — C3490 Malignant neoplasm of unspecified part of unspecified bronchus or lung: Secondary | ICD-10-CM

## 2019-09-18 ENCOUNTER — Other Ambulatory Visit: Payer: Self-pay

## 2019-09-18 ENCOUNTER — Ambulatory Visit
Admission: RE | Admit: 2019-09-18 | Discharge: 2019-09-18 | Disposition: A | Discharge status: Home / Self Care | Payer: PPO | Source: Ambulatory Visit | Attending: Internal Medicine | Admitting: Internal Medicine

## 2019-09-18 DIAGNOSIS — C3490 Malignant neoplasm of unspecified part of unspecified bronchus or lung: Secondary | ICD-10-CM | POA: Diagnosis not present

## 2019-09-18 DIAGNOSIS — C3431 Malignant neoplasm of lower lobe, right bronchus or lung: Secondary | ICD-10-CM | POA: Diagnosis not present

## 2019-09-25 DIAGNOSIS — R001 Bradycardia, unspecified: Secondary | ICD-10-CM | POA: Diagnosis not present

## 2019-10-09 DIAGNOSIS — I10 Essential (primary) hypertension: Secondary | ICD-10-CM | POA: Diagnosis not present

## 2019-10-09 DIAGNOSIS — I712 Thoracic aortic aneurysm, without rupture, unspecified: Secondary | ICD-10-CM | POA: Insufficient documentation

## 2019-10-09 DIAGNOSIS — I441 Atrioventricular block, second degree: Secondary | ICD-10-CM | POA: Diagnosis not present

## 2019-10-09 DIAGNOSIS — I714 Abdominal aortic aneurysm, without rupture: Secondary | ICD-10-CM | POA: Diagnosis not present

## 2019-10-09 DIAGNOSIS — I25810 Atherosclerosis of coronary artery bypass graft(s) without angina pectoris: Secondary | ICD-10-CM | POA: Diagnosis not present

## 2019-10-09 DIAGNOSIS — E7800 Pure hypercholesterolemia, unspecified: Secondary | ICD-10-CM | POA: Diagnosis not present

## 2019-10-09 DIAGNOSIS — E782 Mixed hyperlipidemia: Secondary | ICD-10-CM | POA: Diagnosis not present

## 2019-10-18 ENCOUNTER — Encounter: Payer: Self-pay | Admitting: Anesthesiology

## 2019-10-18 ENCOUNTER — Telehealth: Payer: Self-pay

## 2019-10-18 ENCOUNTER — Ambulatory Visit: Payer: PPO | Attending: Anesthesiology | Admitting: Anesthesiology

## 2019-10-18 ENCOUNTER — Other Ambulatory Visit: Payer: Self-pay

## 2019-10-18 DIAGNOSIS — M961 Postlaminectomy syndrome, not elsewhere classified: Secondary | ICD-10-CM

## 2019-10-18 DIAGNOSIS — M79605 Pain in left leg: Secondary | ICD-10-CM | POA: Diagnosis not present

## 2019-10-18 DIAGNOSIS — R531 Weakness: Secondary | ICD-10-CM | POA: Diagnosis not present

## 2019-10-18 DIAGNOSIS — M48062 Spinal stenosis, lumbar region with neurogenic claudication: Secondary | ICD-10-CM | POA: Diagnosis not present

## 2019-10-18 DIAGNOSIS — M5432 Sciatica, left side: Secondary | ICD-10-CM

## 2019-10-18 DIAGNOSIS — M5136 Other intervertebral disc degeneration, lumbar region: Secondary | ICD-10-CM

## 2019-10-18 DIAGNOSIS — M25562 Pain in left knee: Secondary | ICD-10-CM

## 2019-10-18 DIAGNOSIS — R29898 Other symptoms and signs involving the musculoskeletal system: Secondary | ICD-10-CM

## 2019-10-18 NOTE — Progress Notes (Signed)
Virtual Visit via Telephone Note  I connected with Rick Mcbride. on 10/18/19 at 11:45 AM EST by telephone and verified that I am speaking with the correct person using two identifiers.  Location: Patient: Home Provider: Pain clinic   I discussed the limitations, risks, security and privacy concerns of performing an evaluation and management service by telephone and the availability of in person appointments. I also discussed with the patient that there may be a patient responsible charge related to this service. The patient expressed understanding and agreed to proceed.   History of Present Illness: I spoke with [name] today for his virtual conference via telephone as he was unable to do the video component.  He is a new patient previously unknown to me with a longstanding history of multiple medical problems and chronic low back pain status post a lumbar fusion.  Prior to the fusion he had lower extremity pain with sciatica-like symptoms and had previous epidurals for that.  Following the surgery he has had recurrence of low back pain and recurrent left lower extremity pain greater than right lower extremity pain.  The pain can be quite troubling with associated weakness and spasming.  He gets numbness affecting the left greater than right leg but both legs frequently get weak.  He has difficulty standing for any prolonged period of time and this causes worsening of his symptoms.  He has to sit down and gets pain relief from that.  In the past he has been on opioid medications with side effects and he does not like these and desires not to proceed with opioid management if possible.  He does have tramadol and he takes this sparingly.  He is due to see Dr. Nehemiah Massed for a stress test here in the next week and then Dr. Arnoldo Morale in January for evaluation of his low back pain and repeat spinal films to confirm the status of his fusion.  He describes a pain in the low back that is gnawing aching  with associated radiation into the hips buttocks and low back and posterior legs with numbness and tingling affecting the left greater than right leg with associated weakness that is chronically present.  His bowel and bladder function have been relatively stable with no significant changes there.  He is taking Plavix secondary to severe peripheral vascular disease and coronary artery disease.  Social history.  As per chart  Family history: As per chart  Review of systems: Heart: No angina or current shortness of breath Pulmonary: No wheezing but occasional cough and dyspnea on exertion Musculoskeletal: As above GI: No hematemesis or hematochezia or diarrhea    Observations/Objective: Current Outpatient Medications:  .  acetaminophen (TYLENOL) 500 MG tablet, Take 1,000 mg by mouth 2 (two) times daily. , Disp: , Rfl:  .  amLODipine (NORVASC) 5 MG tablet, Take 1 tablet (5 mg total) by mouth daily. (Patient not taking: Reported on 01/17/2019), Disp: 7 tablet, Rfl: 0 .  aspirin EC 81 MG tablet, Take 81 mg by mouth daily. , Disp: , Rfl:  .  atorvastatin (LIPITOR) 20 MG tablet, Take 40 mg by mouth at bedtime., Disp: , Rfl:  .  Calcium Carbonate-Vitamin D3 (CALCIUM 600-D) 600-400 MG-UNIT TABS, Take 1 tablet by mouth daily at 12 noon. , Disp: , Rfl:  .  carisoprodol (SOMA) 350 MG tablet, Take 350 mg by mouth every 6 (six) hours as needed for muscle spasms. , Disp: , Rfl:  .  Chlorpheniramine Maleate (CHLORPHEN SR) 12  MG TBCR, Take 12 mg by mouth daily., Disp: , Rfl:  .  clopidogrel (PLAVIX) 75 MG tablet, Take 75 mg by mouth daily. , Disp: , Rfl:  .  donepezil (ARICEPT) 5 MG tablet, Take 5 mg by mouth at bedtime., Disp: , Rfl:  .  ezetimibe (ZETIA) 10 MG tablet, Take 10 mg by mouth daily., Disp: , Rfl:  .  ferrous sulfate (SLOW FE) 160 (50 FE) MG TBCR SR tablet, Take 1 tablet by mouth daily. , Disp: , Rfl:  .  fluticasone (FLONASE) 50 MCG/ACT nasal spray, Place 2 sprays into both nostrils 2 (two)  times daily. , Disp: , Rfl:  .  losartan (COZAAR) 100 MG tablet, Take 100 mg by mouth daily., Disp: , Rfl:  .  Melatonin 10 MG TABS, Take 10 mg by mouth at bedtime. , Disp: , Rfl:  .  metoprolol succinate (TOPROL-XL) 25 MG 24 hr tablet, Take 25 mg by mouth daily., Disp: , Rfl:  .  Multiple Vitamin (MULTIVITAMIN WITH MINERALS) TABS tablet, Take 1 tablet by mouth 2 (two) times daily., Disp: , Rfl:  .  Multiple Vitamins-Minerals (PRESERVISION/LUTEIN) CAPS, Take 1 capsule by mouth 2 (two) times daily. , Disp: , Rfl:  .  omeprazole (PRILOSEC) 20 MG capsule, Take 20 mg by mouth daily. , Disp: , Rfl:  .  senna (SENOKOT) 8.6 MG tablet, Take 1 tablet by mouth daily., Disp: , Rfl:  .  vitamin B-12 (CYANOCOBALAMIN) 1000 MCG tablet, Take 1,000 mcg by mouth daily., Disp: , Rfl:  .  zolpidem (AMBIEN) 10 MG tablet, Take 10 mg by mouth at bedtime as needed for sleep. , Disp: , Rfl:   Physical exam deferred secondary to virtual visit with Covid crisis Assessment and Plan:  1. DDD (degenerative disc disease), lumbar   2. Failed back surgical syndrome   3. Arthralgia of left lower leg   4. Weakness of both legs   5. Sciatica of left side   6. Spinal stenosis, lumbar region, with neurogenic claudication   Based on our discussion today I am requesting that the patient follow-up with Dr. Nehemiah Massed and gain confirmation whether he can discontinue his Plavix.  This would need to be done 7 to 10 days prior to a caudal epidural steroid based on our discussion and review of his radiographic findings.  Considering the severity of his symptoms and the chronicity and age I am hopeful that he can gain some lower extremity relief of symptoms and perhaps some improvement in his low back pain.  Ultimately he may be a candidate for a Duragesic low-dose patch.  Furthermore I have encouraged him to follow-up with Dr. Arnoldo Morale for further radiographic evaluation of his fusion.  We will schedule return to clinic in 1 month with a caudal  epidural steroid at that time.  Of gone over the risks and benefits of the procedure with him in detail today.  I have answered his questions to the best possible. Follow Up Instructions:    I discussed the assessment and treatment plan with the patient. The patient was provided an opportunity to ask questions and all were answered. The patient agreed with the plan and demonstrated an understanding of the instructions.   The patient was advised to call back or seek an in-person evaluation if the symptoms worsen or if the condition fails to improve as anticipated.  I provided 30 minutes of non-face-to-face time during this encounter.   Molli Barrows, MD

## 2019-10-18 NOTE — Telephone Encounter (Signed)
This patient needs clearance to stop his plavix, per Dr. Andree Elk orders. He doesn't need prior auth for epidural, so when yall get the clearance please let us know to schedule.  Thank you!

## 2019-10-18 NOTE — Telephone Encounter (Signed)
Clearance sent to Dr Serafina Royals to stop Plavix x7 days

## 2019-10-24 ENCOUNTER — Telehealth: Payer: Self-pay | Admitting: *Deleted

## 2019-10-24 DIAGNOSIS — I10 Essential (primary) hypertension: Secondary | ICD-10-CM | POA: Diagnosis not present

## 2019-10-24 DIAGNOSIS — I441 Atrioventricular block, second degree: Secondary | ICD-10-CM | POA: Diagnosis not present

## 2019-10-24 DIAGNOSIS — I714 Abdominal aortic aneurysm, without rupture: Secondary | ICD-10-CM | POA: Diagnosis not present

## 2019-10-24 DIAGNOSIS — I712 Thoracic aortic aneurysm, without rupture: Secondary | ICD-10-CM | POA: Diagnosis not present

## 2019-10-24 DIAGNOSIS — I25810 Atherosclerosis of coronary artery bypass graft(s) without angina pectoris: Secondary | ICD-10-CM | POA: Diagnosis not present

## 2019-10-24 NOTE — Telephone Encounter (Signed)
Patient wife called back and states that we should be getting a fax from the cardiologist regarding blood thinner.

## 2019-10-24 NOTE — Telephone Encounter (Signed)
Attempted to call patient back,. No answer.

## 2019-10-29 ENCOUNTER — Telehealth: Payer: Self-pay

## 2019-10-29 NOTE — Telephone Encounter (Signed)
Called patient/wife and notified them that we received approval to stop Plavix 7 days prior to procedure. Pt with understanding.

## 2019-10-29 NOTE — Telephone Encounter (Signed)
Called pt to inform him that Dr Nehemiah Massed approved for him to stop his plavix 7days before his procedure. Patient/wife with understanding.    Blanch Media, Can you see if insurance will approve this and give pt a call to make appointment. Dr Andree Elk has in his notes for a caudal epidural.

## 2019-10-29 NOTE — Telephone Encounter (Signed)
When I talked with the wife she understood that it would be after the New Year etc. So its Ok to add them. Thanks

## 2019-10-29 NOTE — Telephone Encounter (Signed)
No authorization is reqd but Dr. Andree Elk doesn't have any openings until after the New year. Unless he wants to add him on somewhere. Please advise if he wants to get him in sooner.

## 2019-11-06 ENCOUNTER — Ambulatory Visit (INDEPENDENT_AMBULATORY_CARE_PROVIDER_SITE_OTHER): Payer: PPO

## 2019-11-06 ENCOUNTER — Other Ambulatory Visit: Payer: Self-pay

## 2019-11-06 ENCOUNTER — Encounter (INDEPENDENT_AMBULATORY_CARE_PROVIDER_SITE_OTHER): Payer: Self-pay | Admitting: Vascular Surgery

## 2019-11-06 ENCOUNTER — Ambulatory Visit (INDEPENDENT_AMBULATORY_CARE_PROVIDER_SITE_OTHER): Payer: PPO | Admitting: Vascular Surgery

## 2019-11-06 VITALS — BP 162/78 | HR 77 | Resp 17 | Ht 71.0 in | Wt 217.0 lb

## 2019-11-06 DIAGNOSIS — I714 Abdominal aortic aneurysm, without rupture, unspecified: Secondary | ICD-10-CM

## 2019-11-06 DIAGNOSIS — M545 Low back pain, unspecified: Secondary | ICD-10-CM

## 2019-11-06 DIAGNOSIS — I10 Essential (primary) hypertension: Secondary | ICD-10-CM | POA: Diagnosis not present

## 2019-11-06 DIAGNOSIS — G8929 Other chronic pain: Secondary | ICD-10-CM

## 2019-11-06 NOTE — Assessment & Plan Note (Signed)
His aortic duplex today shows essentially no change in his abdominal aortic aneurysm diameter of 6.4 cm with a patent stent graft without endoleak seen today. Doing well status post repair.  Continue to follow on an annual basis.

## 2019-11-06 NOTE — Progress Notes (Signed)
MRN : [id number]  Rick Mcbride. is a 78 y.o. (05-12-1941) male who presents with chief complaint of  Chief Complaint  Patient presents with  . Follow-up    ultrasound  .  History of Present Illness: Patient returns today in follow up of his abdominal aortic aneurysm.  This was repaired not quite 3 years ago with a stent graft and he has done reasonably well.  He continues to have problems with back pain which has been a chronic issue.  He has had back surgery since his aneurysm repair with little improvement and more nerve damage in his legs.  He is scheduled to see the pain clinic next week.  No signs of peripheral embolization and no abdominal pain.  His aortic duplex today shows essentially no change in his abdominal aortic aneurysm diameter of 6.4 cm with a patent stent graft without endoleak seen today.  Current Outpatient Medications  Medication Sig Dispense Refill  . acetaminophen (TYLENOL) 500 MG tablet Take 1,000 mg by mouth 2 (two) times daily.     Marland Kitchen aspirin EC 81 MG tablet Take 81 mg by mouth daily.     Marland Kitchen atorvastatin (LIPITOR) 20 MG tablet Take 40 mg by mouth at bedtime.    . Calcium Carbonate-Vitamin D3 (CALCIUM 600-D) 600-400 MG-UNIT TABS Take 1 tablet by mouth daily at 12 noon.     . carisoprodol (SOMA) 350 MG tablet Take 350 mg by mouth every 6 (six) hours as needed for muscle spasms.     . Chlorpheniramine Maleate (CHLORPHEN SR) 12 MG TBCR Take 12 mg by mouth daily.    . clopidogrel (PLAVIX) 75 MG tablet Take 75 mg by mouth daily.     Marland Kitchen donepezil (ARICEPT) 5 MG tablet Take 5 mg by mouth at bedtime.    . ferrous sulfate (SLOW FE) 160 (50 FE) MG TBCR SR tablet Take 1 tablet by mouth daily.     . fluticasone (FLONASE) 50 MCG/ACT nasal spray Place 2 sprays into both nostrils 2 (two) times daily.     Marland Kitchen losartan (COZAAR) 100 MG tablet Take 100 mg by mouth daily.    . Melatonin 10 MG TABS Take 10 mg by mouth at bedtime.     . metoprolol succinate (TOPROL-XL) 25 MG 24 hr  tablet Take 25 mg by mouth daily.    . Multiple Vitamin (MULTIVITAMIN WITH MINERALS) TABS tablet Take 1 tablet by mouth 2 (two) times daily.    . Multiple Vitamins-Minerals (PRESERVISION/LUTEIN) CAPS Take 1 capsule by mouth 2 (two) times daily.     Marland Kitchen omeprazole (PRILOSEC) 20 MG capsule Take 20 mg by mouth daily.     Marland Kitchen senna (SENOKOT) 8.6 MG tablet Take 1 tablet by mouth daily.    . vitamin B-12 (CYANOCOBALAMIN) 1000 MCG tablet Take 1,000 mcg by mouth daily.    Marland Kitchen zolpidem (AMBIEN) 10 MG tablet Take 10 mg by mouth at bedtime as needed for sleep.     Marland Kitchen amLODipine (NORVASC) 5 MG tablet Take 1 tablet (5 mg total) by mouth daily. (Patient not taking: Reported on 01/17/2019) 7 tablet 0  . ezetimibe (ZETIA) 10 MG tablet Take 10 mg by mouth daily.     No current facility-administered medications for this visit.    Past Medical History:  Diagnosis Date  . AAA (abdominal aortic aneurysm) ([place])   . AAA (abdominal aortic aneurysm) without rupture ([place])   . Anemia   . Aneurysm ([place])    abd aortic  .  Anxiety   . Atrophic kidney   . Cervical radiculopathy   . Chronic airway obstruction (HCC)    not aware of this  . Chronic kidney disease (CKD), stage III (moderate)    followed by Dr. Johnny Bridge  . Chronic tension headaches   . Coronary artery disease   . Coronary atherosclerosis of autologous vein bypass graft   . DDD (degenerative disc disease), lumbar   . Degenerative disc disease, lumbar    with lumbar radiculopathy  . Dyspnea   . Elbow fracture, left   . GERD (gastroesophageal reflux disease)   . H/O adenomatous polyp of colon   . H/O hemorrhoids   . H/O urticaria   . Headache   . Heart disease   . Hypercholesteremia   . Hyperlipidemia   . 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 Lucky Cowboy  . Lung cancer ([place])   . Lung cancer ([place])   . Meralgia paresthetica   . Meralgia paresthetica   . Neuralgia   . Osteoarthritis   . Osteoarthritis     s/p L knee surgery  . 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 Presence of permanent cardiac pacemaker   . Prostate cancer ([place])   . Second degree AV block    Followed by Dr. Nehemiah Massed  . Sinoatrial node dysfunction (HCC)   . Status post partial lobectomy of lung    bottom right   . Stroke ([place])   . TIA (transient ischemic attack)     Past Surgical History:  Procedure Laterality Date  . CATARACT EXTRACTION    . COLONOSCOPY    . COLONOSCOPY    . COLONOSCOPY WITH PROPOFOL N/A 10/20/2015   Procedure: COLONOSCOPY WITH PROPOFOL;  Surgeon: Manya Silvas, MD;  Location[facility] ENDOSCOPY;  Service: Endoscopy;  Laterality: N/A;  . CORONARY ARTERY BYPASS GRAFT     triple  . coronary atherosclerosis of autologous vein bypass graft    . EMBOLIZATION Right 12/27/2016   Procedure: Embolization;  Surgeon: Algernon Huxley, MD;  Location: [facility] CV LAB;  Service: Cardiovascular;  Laterality: Right;  . ENDOVASCULAR REPAIR/STENT GRAFT N/A 01/05/2017   Procedure: Endovascular Repair/Stent Graft;  Surgeon: Algernon Huxley, MD;  Location: [place] [facility];  Service: Cardiovascular;  Laterality: N/A;  . EYE SURGERY Bilateral    cataract extraction  . JOINT REPLACEMENT     shoulder and knees  . KNEE ARTHROSCOPY    . LOBECTOMY  01/31/13   RLL w/squamous cell carcinoma lobectomy  . LUNG REMOVAL, PARTIAL  2014   right lower lobe  . PACEMAKER INSERTION    . PACEMAKER INSERTION  12/2012   Dual chanber pacemaker generator  . partial shoulder replacement Right   . POLYPECTOMY    . PROSTATECTOMY    . TOTAL KNEE ARTHROPLASTY Bilateral   . TOTAL SHOULDER ARTHROPLASTY Left 07/10/2015   Procedure: TOTAL SHOULDER ARTHROPLASTY;  Surgeon: [name], MD;  Location: [facility] ORS;  Service: Orthopedics;  Laterality: Left;  . TOTAL SHOULDER REPLACEMENT       Social History   Tobacco Use  . Smoking status: Former Smoker    Packs/day: 1.50    Years: 45.00    Pack years: 67.50    Quit  date: 04/07/2004    Years since quitting: 15.5  . Smokeless tobacco: Never Used  Substance Use Topics  . Alcohol use: No  .  Drug use: No    Family History  Problem Relation Age of Onset  . Heart attack Mother   . Heart attack Father   . Breast cancer Sister   . Asthma Sister     No Known Allergies   REVIEW OF SYSTEMS(Negative unless checked)  Constitutional: [] ??Weight loss[] ??Fever[] ??Chills Cardiac:[] ??Chest pain[] ??Chest pressure[] ??Palpitations [] ??Shortness of breath when laying flat [] ??Shortness of breath at rest [] ??Shortness of breath with exertion. Vascular: [] ??Pain in legs with walking[] ??Pain in legsat rest[] ??Pain in legs when laying flat [x] ??Claudication [] ??Pain in feet when walking [] ??Pain in feet at rest [] ??Pain in feet when laying flat [] ??History of DVT [] ??Phlebitis [] ??Swelling in legs [] ??Varicose veins [] ??Non-healing ulcers Pulmonary: [] ??Uses home oxygen [] ??Productive cough[] ??Hemoptysis [] ??Wheeze [] ??COPD [] ??Asthma Neurologic: [] ??Dizziness [] ??Blackouts [] ??Seizures [] ??History of stroke [] ??History of TIA[] ??Aphasia [] ??Temporary blindness[] ??Dysphagia [] ??Weaknessor numbness in arms [] ??Weakness or numbnessin legs Musculoskeletal: [x] ??Arthritis [] ??Joint swelling [] ??Joint pain [x] ??Low back pain Hematologic:[] ??Easy bruising[] ??Easy bleeding [] ??Hypercoagulable state [] ??Anemic  Gastrointestinal:[] ??Blood in stool[] ??Vomiting blood[] ??Gastroesophageal reflux/heartburn[] ??Abdominal pain Genitourinary: [] ??Chronic kidney disease [] ??Difficulturination [x] ??Frequenturination [] ??Burning with urination[] ??Hematuria Skin: [] ??Rashes [] ??Ulcers [] ??Wounds Psychological: [] ??History of anxiety[] ??History of major depression.  Physical Examination  BP (!) 162/78 (BP Location: Right Arm)   Pulse 77   Resp 17   Ht 5\' 11"  (1.803 m)    Wt 217 lb (98.4 kg)   BMI 30.27 kg/m  Gen:  WD/WN, NAD Head: [place]/AT, No temporalis wasting. Ear/Nose/Throat: Hearing grossly intact, nares w/o erythema or drainage Eyes: Conjunctiva clear. Sclera non-icteric Neck: Supple.  Trachea midline Pulmonary:  Good air movement, no use of accessory muscles.  Cardiac: RRR, no JVD Vascular:  Vessel Right Left  Radial Palpable Palpable                                   Gastrointestinal: soft, non-tender/non-distended. No increased aortic impulse Musculoskeletal: M/S 5/5 throughout.  No deformity or atrophy.  Mild right lower extremity edema. Neurologic: Sensation grossly intact in extremities.  Symmetrical.  Speech is fluent.  Psychiatric: Judgment intact, Mood & affect appropriate for pt's clinical situation. Dermatologic: No rashes or ulcers noted.  No cellulitis or open wounds.       Labs No results found for this or any previous visit (from the past 2160 hour(s)).  Radiology No results found.  Assessment/Plan Low back pain Has had back surgery after his aneurysm repair which may have stabilized his symptoms somewhat, but he still remains symptomatic  Essential (primary) hypertension blood pressure control important in reducing the progression of atherosclerotic disease and aneurysmal degeneration. On appropriate oral medications.  AAA (abdominal aortic aneurysm) without rupture (HCC) His aortic duplex today shows essentially no change in his abdominal aortic aneurysm diameter of 6.4 cm with a patent stent graft without endoleak seen today. Doing well status post repair.  Continue to follow on an annual basis.    [name], MD  11/06/2019 9:02 AM    This note was created with Dragon medical transcription system.  Any errors from dictation are purely unintentional

## 2019-11-06 NOTE — Patient Instructions (Signed)
Abdominal Aortic Aneurysm  An aneurysm is a bulge in an artery. It happens when blood pushes against a weakened or damaged artery wall. An abdominal aortic aneurysm (AAA) is an aneurysm that occurs in the lower part of the aorta, which is the main artery of the body. The aorta supplies blood from the heart to the rest of the body. Some aneurysms may not cause symptoms. However, an AAA can cause two serious problems:  It can enlarge and burst (rupture).  It can cause blood to flow between the layers of the wall of the aorta through a tear (aortic dissection). Both of these problems are medical emergencies. They can cause bleeding inside the body. If they are not diagnosed and treated right away, they can be life-threatening. What are the causes? The exact cause of this condition is not known. What increases the risk? The following factors may make you more likely to develop this condition:  Being a male 66 years of age or older.  Being Caucasian.  Using tobacco or having a history of tobacco use.  Having a family history of aneurysms.  Having any of the following conditions: ? Hardening of the arteries (arteriosclerosis). ? Inflammation of the walls of an artery (arteritis). ? Certain genetic conditions. ? Obesity. ? An infection in the wall of the aorta (infectious aortitis) caused by bacteria. ? High cholesterol. ? High blood pressure (hypertension). What are the signs or symptoms? Symptoms of this condition vary depending on the size of the aneurysm and how fast it is growing. Most aneurysms grow slowly and do not cause any symptoms. When symptoms do occur, they may include:  Pain in the abdomen, side, or lower back.  Feeling full after eating only small amounts of food.  Feeling a pulsating lump in the abdomen. Symptoms that the aneurysm has ruptured include:  A sudden onset of severe pain in the abdomen, side, or back.  Nausea or vomiting.  Feeling light-headed or  passing out. How is this diagnosed? This condition may be diagnosed with:  A physical exam to check for throbbing and to listen to blood flow in your abdomen.  Tests, such as: ? Ultrasound. ? X-rays. ? CT scan. ? MRI. ? Tests to check your arteries for damage or blockage (angiogram). Because most unruptured AAAs cause no symptoms, they are often found during exams for other conditions. How is this treated? Treatment for this condition depends on:  The size of the aneurysm.  How fast the aneurysm is growing.  Your age.  Risk factors for rupture. If your aneurysm is smaller than 2 inches (5 cm), your health care provider may manage it by:  Checking (monitoring) it regularly to see if it is getting bigger. Depending on the size of the aneurysm, how fast it is growing, and your other risk factors, you may have an ultrasound to monitor it every 3-6 months, every year, or every few years.  Giving you medicines to control blood pressure, treat pain, or fight infection. If your aneurysm is larger than 2 inches (5 cm), your health care provider may do surgery to repair it. Follow these instructions at home: Lifestyle  Do not use any products that contain nicotine or tobacco, such as cigarettes, e-cigarettes, and chewing tobacco. If you need help quitting, ask your health care provider.  Stay physically active and exercise regularly. Talk with your health care provider about how often you should exercise and which types of exercise are safe for you. Eating and drinking  Eat a heart-healthy diet. This includes plenty of fresh fruits and vegetables, whole grains, low-fat (lean) protein, and low-fat dairy products.  Avoid foods that are high in saturated fat and cholesterol, such as red meat and some dairy products.  Do not drink alcohol if: ? Your health care provider tells you not to drink. ? You are pregnant, may be pregnant, or are planning to become pregnant.  If you drink  alcohol: ? Limit how much you use to:  0-1 drink a day for women.  0-2 drinks a day for men. ? Be aware of how much alcohol is in your drink. In the [place] one drink equals one typical bottle of beer (12 oz), one-half glass of wine (5 oz), or one shot of hard liquor (1 oz). General instructions  Take over-the-counter and prescription medicines only as told by your health care provider.  Keep your blood pressure within a normal range. Check it regularly, and ask your health care provider what your target blood pressure should be.  Have your blood sugar (glucose) level and cholesterol levels checked regularly. Follow your health care provider's instructions on how to keep levels within normal limits.  Avoid heavy lifting and activities that take a lot of effort (are strenuous). Ask your health care provider what activities are safe for you.  Keep all follow-up visits as told by your health care provider. This is important. Contact a health care provider if you:  Have pain in your abdomen, side, or back.  Have a throbbing feeling in your abdomen. Get help right away if you:  Have sudden, severe pain in your abdomen, side, or back.  Experience nausea or vomiting.  Have constipation or problems urinating.  Feel light-headed.  Have a rapid heart rate when you stand.  Have sweaty, clammy skin.  Have shortness of breath.  Have a fever. These symptoms may represent a serious problem that is an emergency. Do not wait to see if the symptoms will go away. Get medical help right away. Call your local emergency services (911 in the [place] Do not drive yourself to the hospital. Summary  An aneurysm is a bulge in an artery. It happens when blood pushes against a weakened or damaged artery wall.  Being older, male, Caucasian, having a history of tobacco use, and a family history of aneurysms can increase the risk.  These problems can cause bleeding inside the body and can be  life-threatening. Get medical help right away. This information is not intended to replace advice given to you by your health care provider. Make sure you discuss any questions you have with your health care provider. Document Released: 08/04/2005 Document Revised: 02/12/2019 Document Reviewed: 06/03/2018 Elsevier Patient Education  2020 [employer].

## 2019-11-12 ENCOUNTER — Other Ambulatory Visit: Payer: Self-pay | Admitting: Anesthesiology

## 2019-11-12 ENCOUNTER — Encounter: Payer: Self-pay | Admitting: Anesthesiology

## 2019-11-12 ENCOUNTER — Other Ambulatory Visit: Payer: Self-pay

## 2019-11-12 ENCOUNTER — Ambulatory Visit (HOSPITAL_BASED_OUTPATIENT_CLINIC_OR_DEPARTMENT_OTHER): Payer: PPO | Admitting: Anesthesiology

## 2019-11-12 ENCOUNTER — Ambulatory Visit
Admission: RE | Admit: 2019-11-12 | Discharge: 2019-11-12 | Disposition: A | Discharge status: Home / Self Care | Payer: PPO | Source: Ambulatory Visit | Attending: Anesthesiology | Admitting: Anesthesiology

## 2019-11-12 VITALS — BP 167/103 | HR 60 | Temp 98.3°F | Resp 16 | Ht 71.5 in | Wt 217.0 lb

## 2019-11-12 DIAGNOSIS — G8929 Other chronic pain: Secondary | ICD-10-CM | POA: Insufficient documentation

## 2019-11-12 DIAGNOSIS — R29898 Other symptoms and signs involving the musculoskeletal system: Secondary | ICD-10-CM

## 2019-11-12 DIAGNOSIS — M25511 Pain in right shoulder: Secondary | ICD-10-CM | POA: Diagnosis not present

## 2019-11-12 DIAGNOSIS — M5432 Sciatica, left side: Secondary | ICD-10-CM | POA: Insufficient documentation

## 2019-11-12 DIAGNOSIS — R52 Pain, unspecified: Secondary | ICD-10-CM

## 2019-11-12 DIAGNOSIS — M48062 Spinal stenosis, lumbar region with neurogenic claudication: Secondary | ICD-10-CM

## 2019-11-12 DIAGNOSIS — M961 Postlaminectomy syndrome, not elsewhere classified: Secondary | ICD-10-CM | POA: Diagnosis not present

## 2019-11-12 DIAGNOSIS — M25562 Pain in left knee: Secondary | ICD-10-CM | POA: Insufficient documentation

## 2019-11-12 DIAGNOSIS — M5136 Other intervertebral disc degeneration, lumbar region: Secondary | ICD-10-CM | POA: Insufficient documentation

## 2019-11-12 DIAGNOSIS — M51369 Other intervertebral disc degeneration, lumbar region without mention of lumbar back pain or lower extremity pain: Secondary | ICD-10-CM

## 2019-11-12 HISTORY — DX: Other chronic pain: G89.29

## 2019-11-12 MED ORDER — IOHEXOL 180 MG/ML  SOLN
INTRAMUSCULAR | Status: AC
  Filled 2019-11-12: qty 20

## 2019-11-12 MED ORDER — SODIUM CHLORIDE 0.9% FLUSH
10.0000 mL | Freq: Once | INTRAVENOUS | Status: AC
  Administered 2019-11-12: 10 mL

## 2019-11-12 MED ORDER — SODIUM CHLORIDE (PF) 0.9 % IJ SOLN
INTRAMUSCULAR | Status: AC
  Filled 2019-11-12: qty 10

## 2019-11-12 MED ORDER — LIDOCAINE HCL (PF) 1 % IJ SOLN
5.0000 mL | Freq: Once | INTRAMUSCULAR | Status: AC
  Administered 2019-11-12: 5 mL via SUBCUTANEOUS

## 2019-11-12 MED ORDER — IOPAMIDOL (ISOVUE-M 200) INJECTION 41%
20.0000 mL | Freq: Once | INTRAMUSCULAR | Status: DC | PRN
  Administered 2019-11-12: 20 mL

## 2019-11-12 MED ORDER — TRIAMCINOLONE ACETONIDE 40 MG/ML IJ SUSP
INTRAMUSCULAR | Status: AC
  Filled 2019-11-12: qty 1

## 2019-11-12 MED ORDER — ROPIVACAINE HCL 2 MG/ML IJ SOLN
INTRAMUSCULAR | Status: AC
  Filled 2019-11-12: qty 10

## 2019-11-12 MED ORDER — TRIAMCINOLONE ACETONIDE 40 MG/ML IJ SUSP
40.0000 mg | Freq: Once | INTRAMUSCULAR | Status: AC
  Administered 2019-11-12: 40 mg

## 2019-11-12 MED ORDER — ROPIVACAINE HCL 2 MG/ML IJ SOLN
10.0000 mL | Freq: Once | INTRAMUSCULAR | Status: AC
  Administered 2019-11-12: 10 mL via EPIDURAL

## 2019-11-12 MED ORDER — LIDOCAINE HCL 2 % IJ SOLN
INTRAMUSCULAR | Status: AC
  Filled 2019-11-12: qty 20

## 2019-11-12 NOTE — Progress Notes (Signed)
Subjective:  Patient ID: Rick Eth., male    DOB: 1941-04-30  Age: 79 y.o. 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 1 with no sedation  HPI Rick Mcbride. presents for procedure today.  He has a longstanding history of low back pain and had a previous back fusion in May 2018.  He was doing reasonably well other than some persistent chronic weakness and numbness to the lower extremities until the Covid crisis this past spring.  He had managed to stay reasonably active and was beginning to exercise via treadmill about that time however the gyms closed and quarantine hip.  Since that time he has had worsening low back pain with intermittent calf cramping.  He has chronic numbness affecting the left internal leg up to the knee and less so on the right.  He has chronic weakness affecting the lower extremities which has been a long ongoing problem.  He states that the pain that he experiences is dull aching chronic gnawing primarily located in the low back with occasional radiation in the posterior legs.  He previously had back surgery that helped somewhat with the severity of his low back pain.  Otherwise the pain is present throughout much of the day and he continues to have unremitting pain that keeps him up at night and limits his activity level.  Outpatient Medications Prior to Visit  Medication Sig Dispense Refill  . acetaminophen (TYLENOL) 500 MG tablet Take 1,000 mg by mouth 2 (two) times daily.     Marland Kitchen aspirin EC 81 MG tablet Take 81 mg by mouth daily.     Marland Kitchen atorvastatin (LIPITOR) 20 MG tablet Take 40 mg by mouth at bedtime.    . Calcium Carbonate-Vitamin D3 (CALCIUM 600-D)  600-400 MG-UNIT TABS Take 1 tablet by mouth daily at 12 noon.     . carisoprodol (SOMA) 350 MG tablet Take 350 mg by mouth every 6 (six) hours as needed for muscle spasms.     . cetirizine (ZYRTEC) 10 MG tablet Take 10 mg by mouth daily.    . clopidogrel (PLAVIX) 75 MG tablet Take 75 mg by mouth daily.     . clotrimazole (LOTRIMIN) 1 % cream Apply 1 application topically 2 (two) times daily.    Marland Kitchen donepezil (ARICEPT) 5 MG tablet Take 5 mg by mouth at bedtime.    . ferrous sulfate (SLOW FE) 160 (50 FE) MG TBCR SR tablet Take 1 tablet by mouth daily.     . fluticasone (FLONASE) 50 MCG/ACT nasal spray Place 2 sprays into both nostrils 2 (two) times daily.     Marland Kitchen losartan (COZAAR) 100 MG tablet Take 100 mg by mouth daily.    . Melatonin 10 MG TABS Take 10 mg by mouth at bedtime.     . metoprolol succinate (TOPROL-XL) 25 MG 24 hr tablet Take 25 mg by mouth daily.    . Multiple Vitamin (MULTIVITAMIN WITH MINERALS) TABS tablet Take 1 tablet by mouth 2 (two) times daily.    . Multiple Vitamins-Minerals (PRESERVISION/LUTEIN) CAPS Take 1 capsule by mouth 2 (two) times daily.     . pantoprazole (PROTONIX) 40 MG tablet Take 40 mg by mouth daily.    Marland Kitchen  senna (SENOKOT) 8.6 MG tablet Take 1 tablet by mouth daily.    . vitamin B-12 (CYANOCOBALAMIN) 1000 MCG tablet Take 1,000 mcg by mouth daily.    Marland Kitchen zolpidem (AMBIEN) 10 MG tablet Take 10 mg by mouth at bedtime as needed for sleep.     Marland Kitchen amLODipine (NORVASC) 5 MG tablet Take 1 tablet (5 mg total) by mouth daily. (Patient not taking: Reported on 01/17/2019) 7 tablet 0  . Chlorpheniramine Maleate (CHLORPHEN SR) 12 MG TBCR Take 12 mg by mouth daily.    Marland Kitchen ezetimibe (ZETIA) 10 MG tablet Take 10 mg by mouth daily.    Marland Kitchen omeprazole (PRILOSEC) 20 MG capsule Take 20 mg by mouth daily.      No facility-administered medications prior to visit.    Review of Systems CNS: No confusion or sedation Cardiac: No angina or palpitations GI: No abdominal pain or  constipation Constitutional: No nausea vomiting fevers or chills  Objective:  BP (!) 178/73 (BP Location: Right Arm, Patient Position: Sitting, Cuff Size: Large)   Pulse 60   Temp 98.3 F (36.8 C) (Temporal)   Resp 16   Ht 5' 11.5" (1.816 m)   Wt 217 lb (98.4 kg)   SpO2 100%   BMI 29.84 kg/m    BP Readings from Last 3 Encounters:  11/12/19 (!) 178/73  11/06/19 (!) 162/78  11/03/18 132/76     Wt Readings from Last 3 Encounters:  11/12/19 217 lb (98.4 kg)  11/06/19 217 lb (98.4 kg)  11/03/18 219 lb (99.3 kg)     Physical Exam Pt is alert and oriented PERRL EOMI HEART IS RRR no murmur or rub LCTA no wheezing or rales MUSCULOSKELETAL reveals some paraspinous muscle tenderness but no overt trigger points in the lumbar region.  He has a well-healed midline scar approximately 5 to 7 cm in length vertical.  He has limited pain on extension with left and right lateral rotation.  He has difficulty going from seated to standing and has significant problems with balance and walking.  Labs  Lab Results  Component Value Date   HGBA1C 5.5 06/06/2017   Lab Results  Component Value Date   CREATININE 2.03 (H) 10/12/2018    -------------------------------------------------------------------------------------------------------------------- Lab Results  Component Value Date   WBC 8.1 10/12/2018   HGB 13.0 10/12/2018   HCT 40.2 10/12/2018   PLT 140 (L) 10/12/2018   GLUCOSE 127 (H) 10/12/2018   ALT 16 10/12/2018   AST 16 10/12/2018   NA 138 10/12/2018   K 3.9 10/12/2018   CL 108 10/12/2018   CREATININE 2.03 (H) 10/12/2018   BUN 24 (H) 10/12/2018   CO2 22 10/12/2018   TSH 3.077 06/06/2017   INR 0.94 03/26/2017   HGBA1C 5.5 06/06/2017    --------------------------------------------------------------------------------------------------------------------- No results found.   Assessment & Plan:   Rick Mcbride was seen today for back pain, shoulder pain, peripheral neuropathy  and hand pain.  Diagnoses and all orders for this visit:  Weakness of both legs  DDD (degenerative disc disease), lumbar -     Lumbar Epidural Injection -     Lumbar Epidural Injection; Future  Failed back surgical syndrome -     Lumbar Epidural Injection  Sciatica of left side -     Lumbar Epidural Injection -     Lumbar Epidural Injection; Future  Spinal stenosis, lumbar region, with neurogenic claudication -     Lumbar Epidural Injection -     Lumbar Epidural Injection; Future  Arthralgia of left  lower leg  Chronic right shoulder pain  Other orders -     triamcinolone acetonide (KENALOG-40) injection 40 mg -     sodium chloride flush (NS) 0.9 % injection 10 mL -     ropivacaine (PF) 2 mg/mL (0.2%) (NAROPIN) injection 10 mL -     lidocaine (PF) (XYLOCAINE) 1 % injection 5 mL -     iopamidol (ISOVUE-M) 41 % intrathecal injection 20 mL        ----------------------------------------------------------------------------------------------------------------------  Problem List Items Addressed This Visit      Unprioritized   Chronic right shoulder pain   Relevant Medications   triamcinolone acetonide (KENALOG-40) injection 40 mg (Start on 11/12/2019  1:30 PM)   ropivacaine (PF) 2 mg/mL (0.2%) (NAROPIN) injection 10 mL (Start on 11/12/2019  1:30 PM)   lidocaine (PF) (XYLOCAINE) 1 % injection 5 mL (Start on 11/12/2019  1:30 PM)    Other Visit Diagnoses    Weakness of both legs    -  Primary   DDD (degenerative disc disease), lumbar       Relevant Medications   triamcinolone acetonide (KENALOG-40) injection 40 mg (Start on 11/12/2019  1:30 PM)   Other Relevant Orders   Lumbar Epidural Injection   Failed back surgical syndrome       Relevant Medications   triamcinolone acetonide (KENALOG-40) injection 40 mg (Start on 11/12/2019  1:30 PM)   Sciatica of left side       Relevant Orders   Lumbar Epidural Injection   Spinal stenosis, lumbar region, with neurogenic claudication        Relevant Medications   triamcinolone acetonide (KENALOG-40) injection 40 mg (Start on 11/12/2019  1:30 PM)   ropivacaine (PF) 2 mg/mL (0.2%) (NAROPIN) injection 10 mL (Start on 11/12/2019  1:30 PM)   lidocaine (PF) (XYLOCAINE) 1 % injection 5 mL (Start on 11/12/2019  1:30 PM)   Other Relevant Orders   Lumbar Epidural Injection   Arthralgia of left lower leg            ----------------------------------------------------------------------------------------------------------------------  1. DDD (degenerative disc disease), lumbar We have had a long discussion regarding the chronicity of his back pain and I feel he would be a candidate for a caudal epidural steroid injection.  We have gone over the risks and benefits of the procedure with him in detail.  I have answered his questions today.  I feel this gives him a reasonable chance to get back some of the pain control prior to the Covid crisis.  We have had a very earnest discussion about the probability that we he will have perpetual low back pain.  At present I want him return to clinic in 1 month for reevaluation possible repeat injection. - Lumbar Epidural Injection - Lumbar Epidural Injection; Future  2. Failed back surgical syndrome Continue core stretching strengthening exercises as best maintain.  Ultimately he may be a candidate for low-dose Duragesic patch or return to his tramadol. - Lumbar Epidural Injection  3. Sciatica of left side As above - Lumbar Epidural Injection - Lumbar Epidural Injection; Future  4. Spinal stenosis, lumbar region, with neurogenic claudication  - Lumbar Epidural Injection - Lumbar Epidural Injection; Future  5. Weakness of both legs   6. Arthralgia of left lower leg   7. Chronic right shoulder pain     ----------------------------------------------------------------------------------------------------------------------  I am having Rick Mcbride. maintain his aspirin EC,  clopidogrel, ferrous sulfate, omeprazole, PreserVision/Lutein, zolpidem, donepezil, vitamin B-12, amLODipine, Calcium Carbonate-Vitamin D3, multivitamin  with minerals, metoprolol succinate, carisoprodol, fluticasone, Melatonin, acetaminophen, ezetimibe, atorvastatin, losartan, Chlorpheniramine Maleate, senna, pantoprazole, cetirizine, and clotrimazole.   Meds ordered this encounter  Medications  . triamcinolone acetonide (KENALOG-40) injection 40 mg  . sodium chloride flush (NS) 0.9 % injection 10 mL  . ropivacaine (PF) 2 mg/mL (0.2%) (NAROPIN) injection 10 mL  . lidocaine (PF) (XYLOCAINE) 1 % injection 5 mL  . iopamidol (ISOVUE-M) 41 % intrathecal injection 20 mL   Patient's Medications  New Prescriptions   No medications on file  Previous Medications   ACETAMINOPHEN (TYLENOL) 500 MG TABLET    Take 1,000 mg by mouth 2 (two) times daily.    AMLODIPINE (NORVASC) 5 MG TABLET    Take 1 tablet (5 mg total) by mouth daily.   ASPIRIN EC 81 MG TABLET    Take 81 mg by mouth daily.    ATORVASTATIN (LIPITOR) 20 MG TABLET    Take 40 mg by mouth at bedtime.   CALCIUM CARBONATE-VITAMIN D3 (CALCIUM 600-D) 600-400 MG-UNIT TABS    Take 1 tablet by mouth daily at 12 noon.    CARISOPRODOL (SOMA) 350 MG TABLET    Take 350 mg by mouth every 6 (six) hours as needed for muscle spasms.    CETIRIZINE (ZYRTEC) 10 MG TABLET    Take 10 mg by mouth daily.   CHLORPHENIRAMINE MALEATE (CHLORPHEN SR) 12 MG TBCR    Take 12 mg by mouth daily.   CLOPIDOGREL (PLAVIX) 75 MG TABLET    Take 75 mg by mouth daily.    CLOTRIMAZOLE (LOTRIMIN) 1 % CREAM    Apply 1 application topically 2 (two) times daily.   DONEPEZIL (ARICEPT) 5 MG TABLET    Take 5 mg by mouth at bedtime.   EZETIMIBE (ZETIA) 10 MG TABLET    Take 10 mg by mouth daily.   FERROUS SULFATE (SLOW FE) 160 (50 FE) MG TBCR SR TABLET    Take 1 tablet by mouth daily.    FLUTICASONE (FLONASE) 50 MCG/ACT NASAL SPRAY    Place 2 sprays into both nostrils 2 (two) times daily.     LOSARTAN (COZAAR) 100 MG TABLET    Take 100 mg by mouth daily.   MELATONIN 10 MG TABS    Take 10 mg by mouth at bedtime.    METOPROLOL SUCCINATE (TOPROL-XL) 25 MG 24 HR TABLET    Take 25 mg by mouth daily.   MULTIPLE VITAMIN (MULTIVITAMIN WITH MINERALS) TABS TABLET    Take 1 tablet by mouth 2 (two) times daily.   MULTIPLE VITAMINS-MINERALS (PRESERVISION/LUTEIN) CAPS    Take 1 capsule by mouth 2 (two) times daily.    OMEPRAZOLE (PRILOSEC) 20 MG CAPSULE    Take 20 mg by mouth daily.    PANTOPRAZOLE (PROTONIX) 40 MG TABLET    Take 40 mg by mouth daily.   SENNA (SENOKOT) 8.6 MG TABLET    Take 1 tablet by mouth daily.   VITAMIN B-12 (CYANOCOBALAMIN) 1000 MCG TABLET    Take 1,000 mcg by mouth daily.   ZOLPIDEM (AMBIEN) 10 MG TABLET    Take 10 mg by mouth at bedtime as needed for sleep.   Modified Medications   No medications on file  Discontinued Medications   No medications on file   ----------------------------------------------------------------------------------------------------------------------  Follow-up: Return in about 1 month (around 12/13/2019) for evaluation, procedure.  After informed consent was obtained and the risks benefits reviewed patient chose to pursue a caudal epidural steroid injection today. With the patient in the  prone position and an IV in place, sedation was with IV versed. Using AP and lateral fluoroscopic guidance I identified the area overlying the sacral hiatus. This area was broadly prepped with DuraPrep 3 and we utilized strict aseptic technique during the procedure. 1% lidocaine was infiltrated 2 cc using a 25-gauge needle overlying the sacral cornu and then using lateral fluoroscopic guidance I advanced an 18-gauge Touhy needle approximately 2 cm through the sacral hiatus. Confirmation was with 2 cc of contrast dye yielding good epidural spread and no evidence of IV or subarachnoid uptake. This was followed by an injection of 5 cc of saline mixed with 1 cc of  ropivacaine 0.2% and 40 mg of triamcinolone. This was tolerated without difficulty the patient was convalesced and discharged home in stable condition for follow-up as mentioned. Colman Cater, MD

## 2019-11-12 NOTE — Patient Instructions (Signed)
Pain Management Discharge Instructions  General Discharge Instructions :  If you need to reach your doctor call: Monday-Friday 8:00 am - 4:00 pm at [phone] or toll free [phone].  After clinic hours [phone] to have operator reach doctor.  Bring all of your medication bottles to all your appointments in the pain clinic.  To cancel or reschedule your appointment with Pain Management please remember to call 24 hours in advance to avoid a fee.  Refer to the educational materials which you have been given on: General Risks, I had my Procedure. Discharge Instructions, Post Sedation.  Post Procedure Instructions:  The drugs you were given will stay in your system until tomorrow, so for the next 24 hours you should not drive, make any legal decisions or drink any alcoholic beverages.  You may eat anything you prefer, but it is better to start with liquids then soups and crackers, and gradually work up to solid foods.  Please notify your doctor immediately if you have any unusual bleeding, trouble breathing or pain that is not related to your normal pain.  Depending on the type of procedure that was done, some parts of your body may feel week and/or numb.  This usually clears up by tonight or the next day.  Walk with the use of an assistive device or accompanied by an adult for the 24 hours.  You may use ice on the affected area for the first 24 hours.  Put ice in a Ziploc bag and cover with a towel and place against area 15 minutes on 15 minutes off.  You may switch to heat after 24 hours.Epidural Steroid Injection Patient Information  Description: The epidural space surrounds the nerves as they exit the spinal cord.  In some patients, the nerves can be compressed and inflamed by a bulging disc or a tight spinal canal (spinal stenosis).  By injecting steroids into the epidural space, we can bring irritated nerves into direct contact with a potentially helpful medication.  These  steroids act directly on the irritated nerves and can reduce swelling and inflammation which often leads to decreased pain.  Epidural steroids may be injected anywhere along the spine and from the neck to the low back depending upon the location of your pain.   After numbing the skin with local anesthetic (like Novocaine), a small needle is passed into the epidural space slowly.  You may experience a sensation of pressure while this is being done.  The entire block usually last less than 10 minutes.  Conditions which may be treated by epidural steroids:   Low back and leg pain  Neck and arm pain  Spinal stenosis  Post-laminectomy syndrome  Herpes zoster (shingles) pain  Pain from compression fractures  Preparation for the injection:  1. Do not eat any solid food or dairy products within 8 hours of your appointment.  2. You may drink clear liquids up to 3 hours before appointment.  Clear liquids include water, black coffee, juice or soda.  No milk or cream please. 3. You may take your regular medication, including pain medications, with a sip of water before your appointment  Diabetics should hold regular insulin (if taken separately) and take 1/2 normal NPH dos the morning of the procedure.  Carry some sugar containing items with you to your appointment. 4. A driver must accompany you and be prepared to drive you home after your procedure.  5. Bring all your current medications with your. 6. An IV may be inserted and   sedation may be given at the discretion of the physician.   7. A blood pressure cuff, EKG and other monitors will often be applied during the procedure.  Some patients may need to have extra oxygen administered for a short period. 8. You will be asked to provide medical information, including your allergies, prior to the procedure.  We must know immediately if you are taking blood thinners (like Coumadin/Warfarin)  Or if you are allergic to IV iodine contrast (dye). We must  know if you could possible be pregnant.  Possible side-effects:  Bleeding from needle site  Infection (rare, may require surgery)  Nerve injury (rare)  Numbness & tingling (temporary)  Difficulty urinating (rare, temporary)  Spinal headache ( a headache worse with upright posture)  Light -headedness (temporary)  Pain at injection site (several days)  Decreased blood pressure (temporary)  Weakness in arm/leg (temporary)  Pressure sensation in back/neck (temporary)  Call if you experience:  Fever/chills associated with headache or increased back/neck pain.  Headache worsened by an upright position.  New onset weakness or numbness of an extremity below the injection site  Hives or difficulty breathing (go to the emergency room)  Inflammation or drainage at the infection site  Severe back/neck pain  Any new symptoms which are concerning to you  Please note:  Although the local anesthetic injected can often make your back or neck feel good for several hours after the injection, the pain will likely return.  It takes 3-7 days for steroids to work in the epidural space.  You may not notice any pain relief for at least that one week.  If effective, we will often do a series of three injections spaced 3-6 weeks apart to maximally decrease your pain.  After the initial series, we generally will wait several months before considering a repeat injection of the same type.  If you have any questions, please call [phone] [facility] 

## 2019-11-13 ENCOUNTER — Telehealth: Payer: Self-pay

## 2019-11-13 NOTE — Telephone Encounter (Signed)
Post procedure phone call.   

## 2019-11-22 DIAGNOSIS — I129 Hypertensive chronic kidney disease with stage 1 through stage 4 chronic kidney disease, or unspecified chronic kidney disease: Secondary | ICD-10-CM | POA: Diagnosis not present

## 2019-11-22 DIAGNOSIS — N2581 Secondary hyperparathyroidism of renal origin: Secondary | ICD-10-CM | POA: Diagnosis not present

## 2019-11-22 DIAGNOSIS — N2889 Other specified disorders of kidney and ureter: Secondary | ICD-10-CM | POA: Diagnosis not present

## 2019-11-22 DIAGNOSIS — N183 Chronic kidney disease, stage 3 unspecified: Secondary | ICD-10-CM | POA: Diagnosis not present

## 2019-11-22 DIAGNOSIS — D631 Anemia in chronic kidney disease: Secondary | ICD-10-CM | POA: Diagnosis not present

## 2019-11-29 DIAGNOSIS — N184 Chronic kidney disease, stage 4 (severe): Secondary | ICD-10-CM | POA: Diagnosis not present

## 2019-11-29 DIAGNOSIS — R319 Hematuria, unspecified: Secondary | ICD-10-CM | POA: Diagnosis not present

## 2019-11-29 DIAGNOSIS — D631 Anemia in chronic kidney disease: Secondary | ICD-10-CM | POA: Insufficient documentation

## 2019-11-29 DIAGNOSIS — I129 Hypertensive chronic kidney disease with stage 1 through stage 4 chronic kidney disease, or unspecified chronic kidney disease: Secondary | ICD-10-CM | POA: Insufficient documentation

## 2019-11-29 DIAGNOSIS — N2581 Secondary hyperparathyroidism of renal origin: Secondary | ICD-10-CM | POA: Insufficient documentation

## 2019-11-29 DIAGNOSIS — R809 Proteinuria, unspecified: Secondary | ICD-10-CM | POA: Diagnosis not present

## 2019-11-30 ENCOUNTER — Telehealth: Payer: Self-pay

## 2019-11-30 NOTE — Telephone Encounter (Signed)
Patients wife called and had questions about covid vacination.  Attempted to call patient back but the phone rang with no answering machine.

## 2019-11-30 NOTE — Telephone Encounter (Signed)
I rescheduled Rick Mcbride procedure from the 8th to the 24th because of getting his covid vaccine.  Will you please make sure that his insurance approval is good?  Thanks

## 2019-11-30 NOTE — Telephone Encounter (Signed)
Spoke with patients wife.  Cancelled appointment on 12-17-2019 due to patient getting covid vaccine.  Rescheduled to 01-02-2020 at 1300.  Pre procedure instructions given.  Instructed to stop plavix 7 days prior to procedure.  Message sent to insurance clerk to verify approval.   Patiens wife states  Understanding and read back all of instructions.

## 2019-12-03 NOTE — Telephone Encounter (Signed)
No auth rqd Thanks

## 2019-12-04 DIAGNOSIS — I10 Essential (primary) hypertension: Secondary | ICD-10-CM | POA: Diagnosis not present

## 2019-12-04 DIAGNOSIS — Z6829 Body mass index (BMI) 29.0-29.9, adult: Secondary | ICD-10-CM | POA: Diagnosis not present

## 2019-12-04 DIAGNOSIS — M4317 Spondylolisthesis, lumbosacral region: Secondary | ICD-10-CM | POA: Diagnosis not present

## 2019-12-08 ENCOUNTER — Ambulatory Visit: Payer: PPO

## 2019-12-13 ENCOUNTER — Ambulatory Visit: Payer: PPO

## 2019-12-13 ENCOUNTER — Ambulatory Visit: Payer: PPO | Admitting: Anesthesiology

## 2019-12-14 ENCOUNTER — Ambulatory Visit: Payer: PPO | Attending: Internal Medicine

## 2019-12-14 DIAGNOSIS — Z23 Encounter for immunization: Secondary | ICD-10-CM

## 2019-12-14 NOTE — Progress Notes (Signed)
   [id number]  Name:  Rick Mcbride.    MRN: [id number] DOB: Apr 29, 1941  12/14/2019  [name] was observed post Covid-19 immunization for 15 minutes without incidence. He was provided with Vaccine Information Sheet and instruction to access the V-Safe system.   [name] was instructed to call 911 with any severe reactions post vaccine: Marland Kitchen Difficulty breathing  . Swelling of your face and throat  . A fast heartbeat  . A bad rash all over your body  . Dizziness and weakness    Immunizations Administered    Name Date Dose VIS Date Route   Pfizer COVID-19 Vaccine 12/14/2019 10:16 AM 0.3 mL 10/19/2019 Intramuscular   Manufacturer: [place]   Lot: [id number]   [place]: [id number]

## 2019-12-17 ENCOUNTER — Ambulatory Visit: Payer: PPO | Admitting: Anesthesiology

## 2019-12-17 ENCOUNTER — Telehealth: Payer: Self-pay | Admitting: Anesthesiology

## 2019-12-17 NOTE — Telephone Encounter (Signed)
Noted! Thank you

## 2019-12-17 NOTE — Telephone Encounter (Signed)
Patients wife called stating she and patient had CVaccines on 12-14-19 and will be getting 2nd shot on 01-08-20. Requested  to reschedule injections. Moved procedure appt to 01-28-20. They wanted Dr. Andree Elk to know the last injection did help for about 10 days.

## 2020-01-02 ENCOUNTER — Ambulatory Visit: Payer: PPO | Admitting: Anesthesiology

## 2020-01-08 ENCOUNTER — Ambulatory Visit: Payer: PPO | Attending: Internal Medicine

## 2020-01-08 DIAGNOSIS — Z23 Encounter for immunization: Secondary | ICD-10-CM | POA: Insufficient documentation

## 2020-01-08 NOTE — Progress Notes (Signed)
   [id number]  Name:  Rick Mcbride.    MRN: [id number] DOB: November 21, 1940  01/08/2020  Mr. Frazzini was observed post Covid-19 immunization for 15 minutes without incident. He was provided with Vaccine Information Sheet and instruction to access the V-Safe system.   Mr. Sprinkle was instructed to call 911 with any severe reactions post vaccine: Marland Kitchen Difficulty breathing  . Swelling of face and throat  . A fast heartbeat  . A bad rash all over body  . Dizziness and weakness   Immunizations Administered    Name Date Dose VIS Date Route   Pfizer COVID-19 Vaccine 01/08/2020 10:18 AM 0.3 mL 10/19/2019 Intramuscular   Manufacturer: [place]   Lot: [id number]   [place]: [id number]

## 2020-01-09 DIAGNOSIS — I25810 Atherosclerosis of coronary artery bypass graft(s) without angina pectoris: Secondary | ICD-10-CM | POA: Diagnosis not present

## 2020-01-09 DIAGNOSIS — E782 Mixed hyperlipidemia: Secondary | ICD-10-CM | POA: Diagnosis not present

## 2020-01-09 DIAGNOSIS — Z0000 Encounter for general adult medical examination without abnormal findings: Secondary | ICD-10-CM | POA: Diagnosis not present

## 2020-01-09 DIAGNOSIS — I10 Essential (primary) hypertension: Secondary | ICD-10-CM | POA: Diagnosis not present

## 2020-01-09 DIAGNOSIS — N1832 Chronic kidney disease, stage 3b: Secondary | ICD-10-CM | POA: Diagnosis not present

## 2020-01-09 DIAGNOSIS — M25511 Pain in right shoulder: Secondary | ICD-10-CM | POA: Diagnosis not present

## 2020-01-09 DIAGNOSIS — R739 Hyperglycemia, unspecified: Secondary | ICD-10-CM | POA: Diagnosis not present

## 2020-01-09 DIAGNOSIS — Z79899 Other long term (current) drug therapy: Secondary | ICD-10-CM | POA: Diagnosis not present

## 2020-01-09 DIAGNOSIS — G8929 Other chronic pain: Secondary | ICD-10-CM | POA: Diagnosis not present

## 2020-01-09 DIAGNOSIS — R8290 Unspecified abnormal findings in urine: Secondary | ICD-10-CM | POA: Diagnosis not present

## 2020-01-25 DIAGNOSIS — Z96611 Presence of right artificial shoulder joint: Secondary | ICD-10-CM | POA: Insufficient documentation

## 2020-01-25 DIAGNOSIS — M7581 Other shoulder lesions, right shoulder: Secondary | ICD-10-CM | POA: Insufficient documentation

## 2020-01-25 DIAGNOSIS — M19011 Primary osteoarthritis, right shoulder: Secondary | ICD-10-CM | POA: Diagnosis not present

## 2020-01-28 ENCOUNTER — Other Ambulatory Visit: Payer: Self-pay

## 2020-01-28 ENCOUNTER — Ambulatory Visit
Admission: RE | Admit: 2020-01-28 | Discharge: 2020-01-28 | Disposition: A | Discharge status: Home / Self Care | Payer: PPO | Source: Ambulatory Visit | Attending: Anesthesiology | Admitting: Anesthesiology

## 2020-01-28 ENCOUNTER — Ambulatory Visit (HOSPITAL_BASED_OUTPATIENT_CLINIC_OR_DEPARTMENT_OTHER): Payer: PPO | Admitting: Anesthesiology

## 2020-01-28 ENCOUNTER — Other Ambulatory Visit: Payer: Self-pay | Admitting: Anesthesiology

## 2020-01-28 ENCOUNTER — Encounter: Payer: Self-pay | Admitting: Anesthesiology

## 2020-01-28 VITALS — BP 175/83 | HR 58 | Temp 98.2°F | Resp 20 | Ht 71.0 in | Wt 213.0 lb

## 2020-01-28 DIAGNOSIS — M5136 Other intervertebral disc degeneration, lumbar region: Secondary | ICD-10-CM

## 2020-01-28 DIAGNOSIS — M48062 Spinal stenosis, lumbar region with neurogenic claudication: Secondary | ICD-10-CM

## 2020-01-28 DIAGNOSIS — G8929 Other chronic pain: Secondary | ICD-10-CM | POA: Diagnosis not present

## 2020-01-28 DIAGNOSIS — R52 Pain, unspecified: Secondary | ICD-10-CM

## 2020-01-28 DIAGNOSIS — M25511 Pain in right shoulder: Secondary | ICD-10-CM | POA: Insufficient documentation

## 2020-01-28 DIAGNOSIS — M25562 Pain in left knee: Secondary | ICD-10-CM | POA: Insufficient documentation

## 2020-01-28 DIAGNOSIS — R29898 Other symptoms and signs involving the musculoskeletal system: Secondary | ICD-10-CM | POA: Insufficient documentation

## 2020-01-28 DIAGNOSIS — M5432 Sciatica, left side: Secondary | ICD-10-CM

## 2020-01-28 DIAGNOSIS — M961 Postlaminectomy syndrome, not elsewhere classified: Secondary | ICD-10-CM

## 2020-01-28 MED ORDER — TRAMADOL HCL 50 MG PO TABS: 50.0000 mg | ORAL_TABLET | Freq: Two times a day (BID) | ORAL | 1 refills | Status: DC

## 2020-01-28 MED ORDER — METHOCARBAMOL 500 MG PO TABS: 500.0000 mg | ORAL_TABLET | Freq: Two times a day (BID) | ORAL | 3 refills | Status: AC

## 2020-01-28 NOTE — Progress Notes (Signed)
Safety precautions to be maintained throughout the outpatient stay will include: orient to surroundings, keep bed in low position, maintain call bell within reach at all times, provide assistance with transfer out of bed and ambulation.  

## 2020-01-29 NOTE — Progress Notes (Signed)
Subjective:  Patient ID: Rick Mcbride., male    DOB: 1941/03/18  Age: 79 y.o. MRN: [id number]  CC: Back Pain (low)   Procedure: None  HPI [name]. presents for reevaluation.  He was last seen several weeks ago and had his first caudal epidural steroid injection.  He mentions that he had some transient left lateral leg pain and right foot pain following the injection and these were temporary with complete resolution within 5 to 7 days following the procedure.  At this point he is having some intermittent back pain which primarily is worsened with extension and left lateral rotation.  However if he does not do this he does not experience any considerable low back pain.  He feels that his pain is primarily most apparent when he has been standing or walking for any period of time.  When it comes on and if he sits down shortly thereafter the pain will resolve.  He continues to have some numbness and tingling at the base of both feet which has been chronic.  He occasionally has give way weakness and this is particularly troublesome when he has been up walking for any period of time.  Bowel bladder function has been stable.  He does take tramadol for the pain occasionally and this is worked well for him but occasionally makes him feel swimmy headed.  Otherwise he is doing reasonably well at this time.  Outpatient Medications Prior to Visit  Medication Sig Dispense Refill  . acetaminophen (TYLENOL) 500 MG tablet Take 1,000 mg by mouth 2 (two) times daily.     Marland Kitchen ascorbic acid (VITAMIN C) 1000 MG tablet Take by mouth.    Marland Kitchen aspirin EC 81 MG tablet Take 81 mg by mouth daily.     Marland Kitchen atorvastatin (LIPITOR) 20 MG tablet Take 40 mg by mouth at bedtime.    . Calcium Carbonate-Vitamin D 600-200 MG-UNIT CAPS Take by mouth.    . Calcium Carbonate-Vitamin D3 (CALCIUM 600-D) 600-400 MG-UNIT TABS Take 1 tablet by mouth daily at 12 noon.     . carisoprodol (SOMA) 350 MG tablet Take 350 mg by mouth  every 6 (six) hours as needed for muscle spasms.     . cetirizine (ZYRTEC) 10 MG tablet Take 10 mg by mouth daily.    . clopidogrel (PLAVIX) 75 MG tablet Take 75 mg by mouth daily.     . clotrimazole (LOTRIMIN) 1 % cream Apply 1 application topically 2 (two) times daily.    Mariane Baumgarten Sodium (DSS) 100 MG CAPS Take by mouth.    . donepezil (ARICEPT) 5 MG tablet Take 5 mg by mouth at bedtime.    . ferrous sulfate (SLOW FE) 160 (50 FE) MG TBCR SR tablet Take 1 tablet by mouth daily.     . fluticasone (FLONASE) 50 MCG/ACT nasal spray Place 2 sprays into both nostrils 2 (two) times daily.     Marland Kitchen losartan (COZAAR) 100 MG tablet Take 100 mg by mouth daily.    . Melatonin 10 MG TABS Take 10 mg by mouth at bedtime.     . metoprolol succinate (TOPROL-XL) 25 MG 24 hr tablet Take 25 mg by mouth daily.    . Multiple Vitamin (MULTIVITAMIN WITH MINERALS) TABS tablet Take 1 tablet by mouth 2 (two) times daily.    . Multiple Vitamins-Minerals (PRESERVISION/LUTEIN) CAPS Take 1 capsule by mouth 2 (two) times daily.     . pantoprazole (PROTONIX) 40 MG tablet Take 40 mg  by mouth daily.    Marland Kitchen senna (SENOKOT) 8.6 MG tablet Take 1 tablet by mouth daily.    . vitamin B-12 (CYANOCOBALAMIN) 1000 MCG tablet Take 1,000 mcg by mouth daily.    Marland Kitchen zolpidem (AMBIEN) 10 MG tablet Take 10 mg by mouth at bedtime as needed for sleep.     Marland Kitchen amLODipine (NORVASC) 5 MG tablet Take 1 tablet (5 mg total) by mouth daily. (Patient not taking: Reported on 01/17/2019) 7 tablet 0  . Chlorpheniramine Maleate (CHLORPHEN SR) 12 MG TBCR Take 12 mg by mouth daily.    Marland Kitchen ezetimibe (ZETIA) 10 MG tablet Take 10 mg by mouth daily.    Marland Kitchen omeprazole (PRILOSEC) 20 MG capsule Take 20 mg by mouth daily.      No facility-administered medications prior to visit.    Review of Systems CNS: No confusion or sedation Cardiac: No angina or palpitations GI: No abdominal pain or constipation Constitutional: No nausea vomiting fevers or chills  Objective:  BP  (!) 175/83   Pulse (!) 58   Temp 98.2 F (36.8 C)   Resp 20   Ht 5\' 11"  (1.803 m)   Wt 213 lb (96.6 kg)   SpO2 100%   BMI 29.71 kg/m    BP Readings from Last 3 Encounters:  01/28/20 (!) 175/83  11/12/19 (!) 167/103  11/06/19 (!) 162/78     Wt Readings from Last 3 Encounters:  01/28/20 213 lb (96.6 kg)  11/12/19 217 lb (98.4 kg)  11/06/19 217 lb (98.4 kg)     Physical Exam Pt is alert and oriented PERRL EOMI HEART IS RRR no murmur or rub LCTA no wheezing or rales MUSCULOSKELETAL reveals some paraspinous muscle tenderness and a well-healed scar in the midline of the lumbar region.  He does have pain with standing and left lateral rotation as compared to right lateral rotation of this does precipitate some of his primary pain complaint.  Otherwise he does have difficulty ambulating and with standing greater than a minute or 2 he feels that in clinic today he begins to get weak and he did notice some trouble getting back to the chair.  His muscle tone and bulk to the lower extremities is as baseline.  Labs  Lab Results  Component Value Date   HGBA1C 5.5 06/06/2017   Lab Results  Component Value Date   CREATININE 2.03 (H) 10/12/2018    -------------------------------------------------------------------------------------------------------------------- Lab Results  Component Value Date   WBC 8.1 10/12/2018   HGB 13.0 10/12/2018   HCT 40.2 10/12/2018   PLT 140 (L) 10/12/2018   GLUCOSE 127 (H) 10/12/2018   ALT 16 10/12/2018   AST 16 10/12/2018   NA 138 10/12/2018   K 3.9 10/12/2018   CL 108 10/12/2018   CREATININE 2.03 (H) 10/12/2018   BUN 24 (H) 10/12/2018   CO2 22 10/12/2018   TSH 3.077 06/06/2017   INR 0.94 03/26/2017   HGBA1C 5.5 06/06/2017    --------------------------------------------------------------------------------------------------------------------- No results found.   Assessment & Plan:   Sherman was seen today for back pain.  Diagnoses  and all orders for this visit:  DDD (degenerative disc disease), lumbar -     Ambulatory referral to Physical Therapy  Failed back surgical syndrome -     Ambulatory referral to Physical Therapy  Sciatica of left side -     Ambulatory referral to Physical Therapy  Arthralgia of left lower leg  Weakness of both legs  Spinal stenosis, lumbar region, with neurogenic claudication -  Ambulatory referral to Physical Therapy  Chronic right shoulder pain  Other orders -     traMADol (ULTRAM) 50 MG tablet; Take 1 tablet (50 mg total) by mouth 2 (two) times daily. -     methocarbamol (ROBAXIN) 500 MG tablet; Take 1 tablet (500 mg total) by mouth 2 (two) times daily.        ----------------------------------------------------------------------------------------------------------------------  Problem List Items Addressed This Visit      Unprioritized   Chronic right shoulder pain   Relevant Medications   traMADol (ULTRAM) 50 MG tablet   methocarbamol (ROBAXIN) 500 MG tablet    Other Visit Diagnoses    DDD (degenerative disc disease), lumbar    -  Primary   Relevant Medications   traMADol (ULTRAM) 50 MG tablet   methocarbamol (ROBAXIN) 500 MG tablet   Other Relevant Orders   Ambulatory referral to Physical Therapy   Failed back surgical syndrome       Relevant Medications   traMADol (ULTRAM) 50 MG tablet   methocarbamol (ROBAXIN) 500 MG tablet   Other Relevant Orders   Ambulatory referral to Physical Therapy   Sciatica of left side       Relevant Medications   methocarbamol (ROBAXIN) 500 MG tablet   Other Relevant Orders   Ambulatory referral to Physical Therapy   Arthralgia of left lower leg       Weakness of both legs       Spinal stenosis, lumbar region, with neurogenic claudication       Relevant Medications   traMADol (ULTRAM) 50 MG tablet   methocarbamol (ROBAXIN) 500 MG tablet   Other Relevant Orders   Ambulatory referral to Physical Therapy         ----------------------------------------------------------------------------------------------------------------------  1. DDD (degenerative disc disease), lumbar We going to defer on any repeat injections today.  I will reserve these for any recurrence of sciatica-like symptoms should they recur.  I am also going to send him to physical therapy to assist with evaluation and management.  It does appear that he does have significant component of musculoskeletal pain on top of the baseline spinal stenosis symptoms. - Ambulatory referral to Physical Therapy  2. Failed back surgical syndrome I talked about several options regarding further interventional therapy including facet blocks and possible repeat caudal if sciatica symptoms present.  Present I do not think that they are indicated and we will reinitiate the tramadol for him.  Hopefully the side effects from this will abate and have encouraged him to use this primarily at nighttime the first week or 2.  He may be a candidate for a low-dose Duragesic patch if necessary. - Ambulatory referral to Physical Therapy  3. Sciatica of left side Continue with core stretching exercises as reviewed today and via physical therapy - Ambulatory referral to Physical Therapy  4. Arthralgia of left lower leg   5. Weakness of both legs This is of a chronic nature.  This is most likely secondary to severe degenerative lumbar disease and spinal stenosis and is been deemed nonsurgical  6. Spinal stenosis, lumbar region, with neurogenic claudication As above - Ambulatory referral to Physical Therapy  7. Chronic right shoulder pain     ----------------------------------------------------------------------------------------------------------------------  I am having Cash T. Schenk Jr. start on traMADol and methocarbamol. I am also having him maintain his aspirin EC, clopidogrel, ferrous sulfate, omeprazole, PreserVision/Lutein, zolpidem,  donepezil, vitamin B-12, amLODipine, Calcium Carbonate-Vitamin D3, multivitamin with minerals, metoprolol succinate, carisoprodol, fluticasone, Melatonin, acetaminophen, ezetimibe, atorvastatin, losartan, Chlorpheniramine Maleate,  senna, pantoprazole, cetirizine, clotrimazole, ascorbic acid, Calcium Carbonate-Vitamin D, and DSS.   Meds ordered this encounter  Medications  . traMADol (ULTRAM) 50 MG tablet    Sig: Take 1 tablet (50 mg total) by mouth 2 (two) times daily.    Dispense:  60 tablet    Refill:  1  . methocarbamol (ROBAXIN) 500 MG tablet    Sig: Take 1 tablet (500 mg total) by mouth 2 (two) times daily.    Dispense:  6060 tablet    Refill:  3   Patient's Medications  New Prescriptions   METHOCARBAMOL (ROBAXIN) 500 MG TABLET    Take 1 tablet (500 mg total) by mouth 2 (two) times daily.   TRAMADOL (ULTRAM) 50 MG TABLET    Take 1 tablet (50 mg total) by mouth 2 (two) times daily.  Previous Medications   ACETAMINOPHEN (TYLENOL) 500 MG TABLET    Take 1,000 mg by mouth 2 (two) times daily.    AMLODIPINE (NORVASC) 5 MG TABLET    Take 1 tablet (5 mg total) by mouth daily.   ASCORBIC ACID (VITAMIN C) 1000 MG TABLET    Take by mouth.   ASPIRIN EC 81 MG TABLET    Take 81 mg by mouth daily.    ATORVASTATIN (LIPITOR) 20 MG TABLET    Take 40 mg by mouth at bedtime.   CALCIUM CARBONATE-VITAMIN D 600-200 MG-UNIT CAPS    Take by mouth.   CALCIUM CARBONATE-VITAMIN D3 (CALCIUM 600-D) 600-400 MG-UNIT TABS    Take 1 tablet by mouth daily at 12 noon.    CARISOPRODOL (SOMA) 350 MG TABLET    Take 350 mg by mouth every 6 (six) hours as needed for muscle spasms.    CETIRIZINE (ZYRTEC) 10 MG TABLET    Take 10 mg by mouth daily.   CHLORPHENIRAMINE MALEATE (CHLORPHEN SR) 12 MG TBCR    Take 12 mg by mouth daily.   CLOPIDOGREL (PLAVIX) 75 MG TABLET    Take 75 mg by mouth daily.    CLOTRIMAZOLE (LOTRIMIN) 1 % CREAM    Apply 1 application topically 2 (two) times daily.   DOCUSATE SODIUM (DSS) 100 MG CAPS     Take by mouth.   DONEPEZIL (ARICEPT) 5 MG TABLET    Take 5 mg by mouth at bedtime.   EZETIMIBE (ZETIA) 10 MG TABLET    Take 10 mg by mouth daily.   FERROUS SULFATE (SLOW FE) 160 (50 FE) MG TBCR SR TABLET    Take 1 tablet by mouth daily.    FLUTICASONE (FLONASE) 50 MCG/ACT NASAL SPRAY    Place 2 sprays into both nostrils 2 (two) times daily.    LOSARTAN (COZAAR) 100 MG TABLET    Take 100 mg by mouth daily.   MELATONIN 10 MG TABS    Take 10 mg by mouth at bedtime.    METOPROLOL SUCCINATE (TOPROL-XL) 25 MG 24 HR TABLET    Take 25 mg by mouth daily.   MULTIPLE VITAMIN (MULTIVITAMIN WITH MINERALS) TABS TABLET    Take 1 tablet by mouth 2 (two) times daily.   MULTIPLE VITAMINS-MINERALS (PRESERVISION/LUTEIN) CAPS    Take 1 capsule by mouth 2 (two) times daily.    OMEPRAZOLE (PRILOSEC) 20 MG CAPSULE    Take 20 mg by mouth daily.    PANTOPRAZOLE (PROTONIX) 40 MG TABLET    Take 40 mg by mouth daily.   SENNA (SENOKOT) 8.6 MG TABLET    Take 1 tablet by mouth daily.  VITAMIN B-12 (CYANOCOBALAMIN) 1000 MCG TABLET    Take 1,000 mcg by mouth daily.   ZOLPIDEM (AMBIEN) 10 MG TABLET    Take 10 mg by mouth at bedtime as needed for sleep.   Modified Medications   No medications on file  Discontinued Medications   No medications on file   ----------------------------------------------------------------------------------------------------------------------  Follow-up: Return in about 1 month (around 02/28/2020) for evaluation, med refill.  I am going to refill his tramadol for twice daily dosing.  I am also referring him to physical therapy for 3-week course of treatment and future management with return to clinic in approximately 1 month.    Molli Barrows, MD

## 2020-01-31 DIAGNOSIS — H353132 Nonexudative age-related macular degeneration, bilateral, intermediate dry stage: Secondary | ICD-10-CM | POA: Diagnosis not present

## 2020-02-05 DIAGNOSIS — M545 Low back pain: Secondary | ICD-10-CM | POA: Diagnosis not present

## 2020-02-11 DIAGNOSIS — M545 Low back pain: Secondary | ICD-10-CM | POA: Diagnosis not present

## 2020-02-14 DIAGNOSIS — M545 Low back pain: Secondary | ICD-10-CM | POA: Diagnosis not present

## 2020-02-19 DIAGNOSIS — M545 Low back pain: Secondary | ICD-10-CM | POA: Diagnosis not present

## 2020-02-20 DIAGNOSIS — I129 Hypertensive chronic kidney disease with stage 1 through stage 4 chronic kidney disease, or unspecified chronic kidney disease: Secondary | ICD-10-CM | POA: Diagnosis not present

## 2020-02-20 DIAGNOSIS — D631 Anemia in chronic kidney disease: Secondary | ICD-10-CM | POA: Diagnosis not present

## 2020-02-20 DIAGNOSIS — N184 Chronic kidney disease, stage 4 (severe): Secondary | ICD-10-CM | POA: Diagnosis not present

## 2020-02-20 DIAGNOSIS — R809 Proteinuria, unspecified: Secondary | ICD-10-CM | POA: Diagnosis not present

## 2020-02-20 DIAGNOSIS — N2581 Secondary hyperparathyroidism of renal origin: Secondary | ICD-10-CM | POA: Diagnosis not present

## 2020-02-20 DIAGNOSIS — R319 Hematuria, unspecified: Secondary | ICD-10-CM | POA: Diagnosis not present

## 2020-02-22 DIAGNOSIS — M545 Low back pain: Secondary | ICD-10-CM | POA: Diagnosis not present

## 2020-02-25 ENCOUNTER — Encounter: Payer: Self-pay | Admitting: Anesthesiology

## 2020-02-25 ENCOUNTER — Other Ambulatory Visit: Payer: Self-pay

## 2020-02-25 ENCOUNTER — Ambulatory Visit: Payer: PPO | Attending: Anesthesiology | Admitting: Anesthesiology

## 2020-02-25 DIAGNOSIS — M48062 Spinal stenosis, lumbar region with neurogenic claudication: Secondary | ICD-10-CM | POA: Diagnosis not present

## 2020-02-25 DIAGNOSIS — M5432 Sciatica, left side: Secondary | ICD-10-CM | POA: Diagnosis not present

## 2020-02-25 DIAGNOSIS — M961 Postlaminectomy syndrome, not elsewhere classified: Secondary | ICD-10-CM | POA: Diagnosis not present

## 2020-02-25 DIAGNOSIS — R29898 Other symptoms and signs involving the musculoskeletal system: Secondary | ICD-10-CM | POA: Diagnosis not present

## 2020-02-25 DIAGNOSIS — M545 Low back pain: Secondary | ICD-10-CM | POA: Diagnosis not present

## 2020-02-25 DIAGNOSIS — M5136 Other intervertebral disc degeneration, lumbar region: Secondary | ICD-10-CM

## 2020-02-25 DIAGNOSIS — M51369 Other intervertebral disc degeneration, lumbar region without mention of lumbar back pain or lower extremity pain: Secondary | ICD-10-CM

## 2020-02-25 MED ORDER — TRAMADOL HCL 50 MG PO TABS: 50.0000 mg | ORAL_TABLET | Freq: Four times a day (QID) | ORAL | 2 refills | Status: AC | PRN

## 2020-02-26 DIAGNOSIS — I441 Atrioventricular block, second degree: Secondary | ICD-10-CM | POA: Diagnosis not present

## 2020-02-27 DIAGNOSIS — D631 Anemia in chronic kidney disease: Secondary | ICD-10-CM | POA: Diagnosis not present

## 2020-02-27 DIAGNOSIS — I129 Hypertensive chronic kidney disease with stage 1 through stage 4 chronic kidney disease, or unspecified chronic kidney disease: Secondary | ICD-10-CM | POA: Diagnosis not present

## 2020-02-27 DIAGNOSIS — M545 Low back pain: Secondary | ICD-10-CM | POA: Diagnosis not present

## 2020-02-27 DIAGNOSIS — R809 Proteinuria, unspecified: Secondary | ICD-10-CM | POA: Diagnosis not present

## 2020-02-27 DIAGNOSIS — N2581 Secondary hyperparathyroidism of renal origin: Secondary | ICD-10-CM | POA: Diagnosis not present

## 2020-02-27 DIAGNOSIS — N184 Chronic kidney disease, stage 4 (severe): Secondary | ICD-10-CM | POA: Diagnosis not present

## 2020-02-27 DIAGNOSIS — R319 Hematuria, unspecified: Secondary | ICD-10-CM | POA: Diagnosis not present

## 2020-02-27 NOTE — Progress Notes (Signed)
Virtual Visit via Telephone Note  I connected with Rick Mcbride. on 02/27/20 at  1:00 PM EDT by telephone and verified that I am speaking with the correct person using two identifiers.  Location: Patient: Home Provider: Pain control center   I discussed the limitations, risks, security and privacy concerns of performing an evaluation and management service by telephone and the availability of in person appointments. I also discussed with the patient that there may be a patient responsible charge related to this service. The patient expressed understanding and agreed to proceed.   History of Present Illness: I spoke with her Rick Mcbride today via telephone as he was unable to do the video portion of the virtual conference.  He reports that his pain is similar in nature to what was previously reported.  It is mainly a pain that is worse with prolonged standing and ambulation.  Generally when he is quiet or recumbent the pain is better.  He occasionally takes the tramadol which does help with his pain relief.  He still has chronic numbness in both legs.  This is worse with standing and associated with weakness.  The tramadol does seem to give him good relief when he does have a pain control crisis.  Otherwise he is in his usual state of health.  Bowel and bladder function been stable.  He feels that he was making good progress with the physical therapy and would like to continue with that if possible.     Observations/Objective:  Current Outpatient Medications:  .  acetaminophen (TYLENOL) 500 MG tablet, Take 1,000 mg by mouth 2 (two) times daily. , Disp: , Rfl:  .  amLODipine (NORVASC) 5 MG tablet, Take 1 tablet (5 mg total) by mouth daily. (Patient not taking: Reported on 01/17/2019), Disp: 7 tablet, Rfl: 0 .  ascorbic acid (VITAMIN C) 1000 MG tablet, Take by mouth., Disp: , Rfl:  .  aspirin EC 81 MG tablet, Take 81 mg by mouth daily. , Disp: , Rfl:  .  atorvastatin (LIPITOR) 20 MG tablet, Take  40 mg by mouth at bedtime., Disp: , Rfl:  .  Calcium Carbonate-Vitamin D 600-200 MG-UNIT CAPS, Take by mouth., Disp: , Rfl:  .  Calcium Carbonate-Vitamin D3 (CALCIUM 600-D) 600-400 MG-UNIT TABS, Take 1 tablet by mouth daily at 12 noon. , Disp: , Rfl:  .  carisoprodol (SOMA) 350 MG tablet, Take 350 mg by mouth every 6 (six) hours as needed for muscle spasms. , Disp: , Rfl:  .  cetirizine (ZYRTEC) 10 MG tablet, Take 10 mg by mouth daily., Disp: , Rfl:  .  Chlorpheniramine Maleate (CHLORPHEN SR) 12 MG TBCR, Take 12 mg by mouth daily., Disp: , Rfl:  .  clopidogrel (PLAVIX) 75 MG tablet, Take 75 mg by mouth daily. , Disp: , Rfl:  .  clotrimazole (LOTRIMIN) 1 % cream, Apply 1 application topically 2 (two) times daily., Disp: , Rfl:  .  Docusate Sodium (DSS) 100 MG CAPS, Take by mouth., Disp: , Rfl:  .  donepezil (ARICEPT) 5 MG tablet, Take 5 mg by mouth at bedtime., Disp: , Rfl:  .  ezetimibe (ZETIA) 10 MG tablet, Take 10 mg by mouth daily., Disp: , Rfl:  .  ferrous sulfate (SLOW FE) 160 (50 FE) MG TBCR SR tablet, Take 1 tablet by mouth daily. , Disp: , Rfl:  .  fluticasone (FLONASE) 50 MCG/ACT nasal spray, Place 2 sprays into both nostrils 2 (two) times daily. , Disp: , Rfl:  .  losartan (COZAAR) 100 MG tablet, Take 100 mg by mouth daily., Disp: , Rfl:  .  Melatonin 10 MG TABS, Take 10 mg by mouth at bedtime. , Disp: , Rfl:  .  methocarbamol (ROBAXIN) 500 MG tablet, Take 1 tablet (500 mg total) by mouth 2 (two) times daily., Disp: 6060 tablet, Rfl: 3 .  metoprolol succinate (TOPROL-XL) 25 MG 24 hr tablet, Take 25 mg by mouth daily., Disp: , Rfl:  .  Multiple Vitamin (MULTIVITAMIN WITH MINERALS) TABS tablet, Take 1 tablet by mouth 2 (two) times daily., Disp: , Rfl:  .  Multiple Vitamins-Minerals (PRESERVISION/LUTEIN) CAPS, Take 1 capsule by mouth 2 (two) times daily. , Disp: , Rfl:  .  omeprazole (PRILOSEC) 20 MG capsule, Take 20 mg by mouth daily. , Disp: , Rfl:  .  pantoprazole (PROTONIX) 40 MG  tablet, Take 40 mg by mouth daily., Disp: , Rfl:  .  senna (SENOKOT) 8.6 MG tablet, Take 1 tablet by mouth daily., Disp: , Rfl:  .  traMADol (ULTRAM) 50 MG tablet, Take 1 tablet (50 mg total) by mouth every 6 (six) hours as needed., Disp: 90 tablet, Rfl: 2 .  vitamin B-12 (CYANOCOBALAMIN) 1000 MCG tablet, Take 1,000 mcg by mouth daily., Disp: , Rfl:  .  zolpidem (AMBIEN) 10 MG tablet, Take 10 mg by mouth at bedtime as needed for sleep. , Disp: , Rfl:   Assessment and Plan: 1. DDD (degenerative disc disease), lumbar   2. Failed back surgical syndrome   3. Sciatica of left side   4. Weakness of both legs   5. Spinal stenosis, lumbar region, with neurogenic claudication   Based on our discussion today I think is appropriate to continue with the tramadol as this is working well for him without any side effects.  In the meantime I think the physical therapy was helping him as well and I would like him to continue this for the next few weeks to get a home regimen established.  He is nonsurgical candidate at this point and there is very little else that I can offer him from an interventional standpoint.  He could be a candidate for chronic opioid maintenance therapy if the tramadol is insufficient.  We have gone over this in the past.  I have him scheduled for return to clinic in 2 months.  Follow Up Instructions:    I discussed the assessment and treatment plan with the patient. The patient was provided an opportunity to ask questions and all were answered. The patient agreed with the plan and demonstrated an understanding of the instructions.   The patient was advised to call back or seek an in-person evaluation if the symptoms worsen or if the condition fails to improve as anticipated.  I provided 25 minutes of non-face-to-face time during this encounter.   Molli Barrows, MD

## 2020-03-05 DIAGNOSIS — M545 Low back pain: Secondary | ICD-10-CM | POA: Diagnosis not present

## 2020-03-07 DIAGNOSIS — M545 Low back pain: Secondary | ICD-10-CM | POA: Diagnosis not present

## 2020-04-14 DIAGNOSIS — M19011 Primary osteoarthritis, right shoulder: Secondary | ICD-10-CM | POA: Diagnosis not present

## 2020-04-14 DIAGNOSIS — M7581 Other shoulder lesions, right shoulder: Secondary | ICD-10-CM | POA: Diagnosis not present

## 2020-04-23 DIAGNOSIS — I25810 Atherosclerosis of coronary artery bypass graft(s) without angina pectoris: Secondary | ICD-10-CM | POA: Diagnosis not present

## 2020-04-23 DIAGNOSIS — I10 Essential (primary) hypertension: Secondary | ICD-10-CM | POA: Diagnosis not present

## 2020-04-23 DIAGNOSIS — I495 Sick sinus syndrome: Secondary | ICD-10-CM | POA: Diagnosis not present

## 2020-04-23 DIAGNOSIS — I712 Thoracic aortic aneurysm, without rupture: Secondary | ICD-10-CM | POA: Diagnosis not present

## 2020-04-23 DIAGNOSIS — I714 Abdominal aortic aneurysm, without rupture: Secondary | ICD-10-CM | POA: Diagnosis not present

## 2020-04-23 DIAGNOSIS — E782 Mixed hyperlipidemia: Secondary | ICD-10-CM | POA: Diagnosis not present

## 2020-04-23 DIAGNOSIS — I441 Atrioventricular block, second degree: Secondary | ICD-10-CM | POA: Diagnosis not present

## 2020-05-17 ENCOUNTER — Other Ambulatory Visit: Payer: Self-pay | Admitting: Anesthesiology

## 2020-05-21 ENCOUNTER — Telehealth: Payer: Self-pay | Admitting: Anesthesiology

## 2020-05-21 NOTE — Telephone Encounter (Addendum)
Patient's wife called stating patient is out of medications and they seemed to help him ease his pain.they did not understand about calling back to schedule appt. I have them set up for July 26 for med mgmt appt. Please ask Dr. Andree Elk if he will send in 1 month of meds for patient on Monday AM. Or if one of the other physicians will send in today or tomorrow. Please let patient know.

## 2020-05-22 MED ORDER — METHOCARBAMOL 500 MG PO TABS: 500.0000 mg | ORAL_TABLET | Freq: Two times a day (BID) | ORAL | 0 refills | Status: DC | PRN

## 2020-05-22 NOTE — Telephone Encounter (Signed)
Patient notified

## 2020-05-22 NOTE — Telephone Encounter (Signed)
He is a patient of Dr. Andree Elk. Will you please send a script for 1 month for Methocarbamol 500 mg, two times per day? [facility]. Last prescribed 01-28-20 wit 4 RF.

## 2020-05-27 DIAGNOSIS — I495 Sick sinus syndrome: Secondary | ICD-10-CM | POA: Diagnosis not present

## 2020-05-29 DIAGNOSIS — Z125 Encounter for screening for malignant neoplasm of prostate: Secondary | ICD-10-CM | POA: Diagnosis not present

## 2020-05-29 DIAGNOSIS — R739 Hyperglycemia, unspecified: Secondary | ICD-10-CM | POA: Diagnosis not present

## 2020-05-29 DIAGNOSIS — R8290 Unspecified abnormal findings in urine: Secondary | ICD-10-CM | POA: Diagnosis not present

## 2020-05-29 DIAGNOSIS — N1832 Chronic kidney disease, stage 3b: Secondary | ICD-10-CM | POA: Diagnosis not present

## 2020-05-29 DIAGNOSIS — I25810 Atherosclerosis of coronary artery bypass graft(s) without angina pectoris: Secondary | ICD-10-CM | POA: Diagnosis not present

## 2020-05-29 DIAGNOSIS — Z79899 Other long term (current) drug therapy: Secondary | ICD-10-CM | POA: Diagnosis not present

## 2020-05-29 DIAGNOSIS — I10 Essential (primary) hypertension: Secondary | ICD-10-CM | POA: Diagnosis not present

## 2020-05-29 DIAGNOSIS — E782 Mixed hyperlipidemia: Secondary | ICD-10-CM | POA: Diagnosis not present

## 2020-05-30 DIAGNOSIS — R8290 Unspecified abnormal findings in urine: Secondary | ICD-10-CM | POA: Diagnosis not present

## 2020-06-02 ENCOUNTER — Other Ambulatory Visit: Payer: Self-pay

## 2020-06-02 ENCOUNTER — Ambulatory Visit: Payer: PPO | Attending: Anesthesiology | Admitting: Anesthesiology

## 2020-06-02 ENCOUNTER — Encounter: Payer: Self-pay | Admitting: Anesthesiology

## 2020-06-02 VITALS — BP 147/76 | HR 68 | Temp 97.3°F | Resp 16 | Ht 71.0 in | Wt 210.0 lb

## 2020-06-02 DIAGNOSIS — M51369 Other intervertebral disc degeneration, lumbar region without mention of lumbar back pain or lower extremity pain: Secondary | ICD-10-CM

## 2020-06-02 DIAGNOSIS — M48062 Spinal stenosis, lumbar region with neurogenic claudication: Secondary | ICD-10-CM

## 2020-06-02 DIAGNOSIS — M25562 Pain in left knee: Secondary | ICD-10-CM | POA: Diagnosis not present

## 2020-06-02 DIAGNOSIS — M5432 Sciatica, left side: Secondary | ICD-10-CM | POA: Diagnosis not present

## 2020-06-02 DIAGNOSIS — M5136 Other intervertebral disc degeneration, lumbar region: Secondary | ICD-10-CM

## 2020-06-02 DIAGNOSIS — G8929 Other chronic pain: Secondary | ICD-10-CM

## 2020-06-02 DIAGNOSIS — M25511 Pain in right shoulder: Secondary | ICD-10-CM | POA: Diagnosis not present

## 2020-06-02 DIAGNOSIS — M961 Postlaminectomy syndrome, not elsewhere classified: Secondary | ICD-10-CM

## 2020-06-02 DIAGNOSIS — R29898 Other symptoms and signs involving the musculoskeletal system: Secondary | ICD-10-CM

## 2020-06-02 MED ORDER — TRAMADOL HCL 50 MG PO TABS: 50.0000 mg | ORAL_TABLET | Freq: Four times a day (QID) | ORAL | 2 refills | Status: AC

## 2020-06-02 MED ORDER — METHOCARBAMOL 500 MG PO TABS: 500.0000 mg | ORAL_TABLET | Freq: Two times a day (BID) | ORAL | 5 refills | Status: AC | PRN

## 2020-06-02 NOTE — Progress Notes (Signed)
Nursing Pain Medication Assessment:  Safety precautions to be maintained throughout the outpatient stay will include: orient to surroundings, keep bed in low position, maintain call bell within reach at all times, provide assistance with transfer out of bed and ambulation.  Medication Inspection Compliance: Mr. Withem did not comply with our request to bring his pills to be counted. He was reminded that bringing the medication bottles, even when empty, is a requirement.  Medication: None brought in. Pill/Patch Count: None available to be counted. Bottle Appearance: No container available. Did not bring bottle(s) to appointment. Filled Date: N/A Last Medication intake:  Today

## 2020-06-02 NOTE — Progress Notes (Signed)
Subjective:  Patient ID: Rick Mcbride., male    DOB: 01/23/1941  Age: 79 y.o. MRN: [id number]  CC: No chief complaint on file.   Procedure: None  HPI Rick Mcbride. presents for reevaluation.  He was last seen a few months ago and has continued to take his tramadol effectively.  He takes a half of a tablet approximately every 4 hours.  He does have some breakthrough pain at night.  He is having no side effects with the tramadol.  He uses it for his wrist and elbow pain as well as his knee and hip pain.  He occasionally has some low back pain which this also seems to be a remedy for.  He has recently talked to Dr. Doy Hutching about taking Celexa for assistance with his pain condition.  Otherwise the quality characteristic and distribution of the pain that he has reported historically has been stable in nature.  He has been using his Robaxin for muscle spasming and this continues to help but he is out of this at this time.  Outpatient Medications Prior to Visit  Medication Sig Dispense Refill  . acetaminophen (TYLENOL) 500 MG tablet Take 1,000 mg by mouth 2 (two) times daily.     Marland Kitchen ascorbic acid (VITAMIN C) 1000 MG tablet Take by mouth.    Marland Kitchen aspirin EC 81 MG tablet Take 81 mg by mouth daily.     Marland Kitchen atorvastatin (LIPITOR) 20 MG tablet Take 40 mg by mouth at bedtime.    . Calcium Carbonate-Vitamin D 600-200 MG-UNIT CAPS Take by mouth.    . Calcium Carbonate-Vitamin D3 (CALCIUM 600-D) 600-400 MG-UNIT TABS Take 1 tablet by mouth daily at 12 noon.     . cetirizine (ZYRTEC) 10 MG tablet Take 10 mg by mouth daily.    . clopidogrel (PLAVIX) 75 MG tablet Take 75 mg by mouth daily.     . clotrimazole (LOTRIMIN) 1 % cream Apply 1 application topically 2 (two) times daily.    Mariane Baumgarten Sodium (DSS) 100 MG CAPS Take by mouth.    . donepezil (ARICEPT) 5 MG tablet Take 5 mg by mouth at bedtime.    . ferrous sulfate (SLOW FE) 160 (50 FE) MG TBCR SR tablet Take 1 tablet by mouth in the morning and  at bedtime.     . fluticasone (FLONASE) 50 MCG/ACT nasal spray Place 2 sprays into both nostrils 2 (two) times daily.     Marland Kitchen losartan (COZAAR) 100 MG tablet Take 100 mg by mouth daily.    . Melatonin 10 MG TABS Take 10 mg by mouth at bedtime.     . metoprolol succinate (TOPROL-XL) 25 MG 24 hr tablet Take 25 mg by mouth daily.    . Multiple Vitamin (MULTIVITAMIN WITH MINERALS) TABS tablet Take 1 tablet by mouth daily.     . Multiple Vitamins-Minerals (PRESERVISION/LUTEIN) CAPS Take 1 capsule by mouth 2 (two) times daily.     . pantoprazole (PROTONIX) 40 MG tablet Take 40 mg by mouth daily.    Marland Kitchen senna (SENOKOT) 8.6 MG tablet Take 1 tablet by mouth daily.    . vitamin B-12 (CYANOCOBALAMIN) 1000 MCG tablet Take 1,000 mcg by mouth daily.    Marland Kitchen zolpidem (AMBIEN) 10 MG tablet Take 10 mg by mouth at bedtime as needed for sleep.     . methocarbamol (ROBAXIN) 500 MG tablet Take 1 tablet (500 mg total) by mouth 2 (two) times daily as needed for muscle spasms. 60 tablet  0  . traMADol (ULTRAM) 50 MG tablet Take by mouth 3 (three) times daily.    Marland Kitchen amLODipine (NORVASC) 5 MG tablet Take 1 tablet (5 mg total) by mouth daily. (Patient not taking: Reported on 01/17/2019) 7 tablet 0  . carisoprodol (SOMA) 350 MG tablet Take 350 mg by mouth every 6 (six) hours as needed for muscle spasms.  (Patient not taking: Reported on 06/02/2020)    . Chlorpheniramine Maleate (CHLORPHEN SR) 12 MG TBCR Take 12 mg by mouth daily. (Patient not taking: Reported on 06/02/2020)    . ezetimibe (ZETIA) 10 MG tablet Take 10 mg by mouth daily.    Marland Kitchen omeprazole (PRILOSEC) 20 MG capsule Take 20 mg by mouth daily.  (Patient not taking: Reported on 06/02/2020)     No facility-administered medications prior to visit.    Review of Systems CNS: No confusion or sedation Cardiac: No angina or palpitations GI: No abdominal pain or constipation Constitutional: No nausea vomiting fevers or chills  Objective:  BP (!) 147/76   Pulse 68   Temp (!)  97.3 F (36.3 C) (Temporal)   Resp 16   Ht 5\' 11"  (1.803 m)   Wt (!) 210 lb (95.3 kg)   SpO2 100%   BMI 29.29 kg/m    BP Readings from Last 3 Encounters:  06/02/20 (!) 147/76  01/28/20 (!) 175/83  11/12/19 (!) 167/103     Wt Readings from Last 3 Encounters:  06/02/20 (!) 210 lb (95.3 kg)  01/28/20 213 lb (96.6 kg)  11/12/19 217 lb (98.4 kg)     Physical Exam Pt is alert and oriented PERRL EOMI HEART IS RRR no murmur or rub LCTA no wheezing or rales MUSCULOSKELETAL reveals a moderately antalgic gait as he ambulates.  His muscle tone and bulk is at baseline.  Labs  Lab Results  Component Value Date   HGBA1C 5.5 06/06/2017   Lab Results  Component Value Date   CREATININE 2.03 (H) 10/12/2018    -------------------------------------------------------------------------------------------------------------------- Lab Results  Component Value Date   WBC 8.1 10/12/2018   HGB 13.0 10/12/2018   HCT 40.2 10/12/2018   PLT 140 (L) 10/12/2018   GLUCOSE 127 (H) 10/12/2018   ALT 16 10/12/2018   AST 16 10/12/2018   NA 138 10/12/2018   K 3.9 10/12/2018   CL 108 10/12/2018   CREATININE 2.03 (H) 10/12/2018   BUN 24 (H) 10/12/2018   CO2 22 10/12/2018   TSH 3.077 06/06/2017   INR 0.94 03/26/2017   HGBA1C 5.5 06/06/2017    --------------------------------------------------------------------------------------------------------------------- No results found.   Assessment & Plan:   Diagnoses and all orders for this visit:  DDD (degenerative disc disease), lumbar  Failed back surgical syndrome  Sciatica of left side  Weakness of both legs  Spinal stenosis, lumbar region, with neurogenic claudication  Arthralgia of left lower leg  Chronic right shoulder pain  Other orders -     methocarbamol (ROBAXIN) 500 MG tablet; Take 1 tablet (500 mg total) by mouth 2 (two) times daily as needed for muscle spasms. -     traMADol (ULTRAM) 50 MG tablet; Take 1 tablet (50 mg  total) by mouth 4 (four) times daily.        ----------------------------------------------------------------------------------------------------------------------  Problem List Items Addressed This Visit      Unprioritized   Chronic right shoulder pain   Relevant Medications   methocarbamol (ROBAXIN) 500 MG tablet   traMADol (ULTRAM) 50 MG tablet    Other Visit Diagnoses  DDD (degenerative disc disease), lumbar    -  Primary   Relevant Medications   methocarbamol (ROBAXIN) 500 MG tablet   traMADol (ULTRAM) 50 MG tablet   Failed back surgical syndrome       Relevant Medications   methocarbamol (ROBAXIN) 500 MG tablet   traMADol (ULTRAM) 50 MG tablet   Sciatica of left side       Relevant Medications   methocarbamol (ROBAXIN) 500 MG tablet   Weakness of both legs       Spinal stenosis, lumbar region, with neurogenic claudication       Relevant Medications   methocarbamol (ROBAXIN) 500 MG tablet   traMADol (ULTRAM) 50 MG tablet   Arthralgia of left lower leg            ----------------------------------------------------------------------------------------------------------------------  1. DDD (degenerative disc disease), lumbar We will defer on any repeat interventional injections.  I want him to continue with core stretching strengthening exercises as reviewed today.  2. Failed back surgical syndrome He can continue with the tramadol and I am going to give him enough medication to utilize for 4 times daily dosing if needed.  We talked about his SSRI possibly enabling him to wean some of the tramadol as well.  3. Sciatica of left side As above  4. Weakness of both legs Chronic  5. Spinal stenosis, lumbar region, with neurogenic claudication As above  6. Arthralgia of left lower leg I have mentioned that a rheumatoid work-up might be indicated with Dr. Doy Hutching.  7. Chronic right shoulder pain As  above    ----------------------------------------------------------------------------------------------------------------------  I have changed Smitty T. Touchet Jr.'s traMADol. I am also having him maintain his aspirin EC, clopidogrel, ferrous sulfate, omeprazole, PreserVision/Lutein, zolpidem, donepezil, vitamin B-12, amLODipine, Calcium Carbonate-Vitamin D3, multivitamin with minerals, metoprolol succinate, carisoprodol, fluticasone, Melatonin, acetaminophen, ezetimibe, atorvastatin, losartan, Chlorpheniramine Maleate, senna, pantoprazole, cetirizine, clotrimazole, ascorbic acid, Calcium Carbonate-Vitamin D, DSS, and methocarbamol.   Meds ordered this encounter  Medications  . methocarbamol (ROBAXIN) 500 MG tablet    Sig: Take 1 tablet (500 mg total) by mouth 2 (two) times daily as needed for muscle spasms.    Dispense:  60 tablet    Refill:  5    Do not place this medication, or any other prescription from our practice, on "Automatic Refill". Patient may have prescription filled one day early if pharmacy is closed on scheduled refill date.  . traMADol (ULTRAM) 50 MG tablet    Sig: Take 1 tablet (50 mg total) by mouth 4 (four) times daily.    Dispense:  120 tablet    Refill:  2   Patient's Medications  New Prescriptions   No medications on file  Previous Medications   ACETAMINOPHEN (TYLENOL) 500 MG TABLET    Take 1,000 mg by mouth 2 (two) times daily.    AMLODIPINE (NORVASC) 5 MG TABLET    Take 1 tablet (5 mg total) by mouth daily.   ASCORBIC ACID (VITAMIN C) 1000 MG TABLET    Take by mouth.   ASPIRIN EC 81 MG TABLET    Take 81 mg by mouth daily.    ATORVASTATIN (LIPITOR) 20 MG TABLET    Take 40 mg by mouth at bedtime.   CALCIUM CARBONATE-VITAMIN D 600-200 MG-UNIT CAPS    Take by mouth.   CALCIUM CARBONATE-VITAMIN D3 (CALCIUM 600-D) 600-400 MG-UNIT TABS    Take 1 tablet by mouth daily at 12 noon.    CARISOPRODOL (SOMA) 350 MG TABLET  Take 350 mg by mouth every 6 (six) hours as  needed for muscle spasms.    CETIRIZINE (ZYRTEC) 10 MG TABLET    Take 10 mg by mouth daily.   CHLORPHENIRAMINE MALEATE (CHLORPHEN SR) 12 MG TBCR    Take 12 mg by mouth daily.   CLOPIDOGREL (PLAVIX) 75 MG TABLET    Take 75 mg by mouth daily.    CLOTRIMAZOLE (LOTRIMIN) 1 % CREAM    Apply 1 application topically 2 (two) times daily.   DOCUSATE SODIUM (DSS) 100 MG CAPS    Take by mouth.   DONEPEZIL (ARICEPT) 5 MG TABLET    Take 5 mg by mouth at bedtime.   EZETIMIBE (ZETIA) 10 MG TABLET    Take 10 mg by mouth daily.   FERROUS SULFATE (SLOW FE) 160 (50 FE) MG TBCR SR TABLET    Take 1 tablet by mouth in the morning and at bedtime.    FLUTICASONE (FLONASE) 50 MCG/ACT NASAL SPRAY    Place 2 sprays into both nostrils 2 (two) times daily.    LOSARTAN (COZAAR) 100 MG TABLET    Take 100 mg by mouth daily.   MELATONIN 10 MG TABS    Take 10 mg by mouth at bedtime.    METOPROLOL SUCCINATE (TOPROL-XL) 25 MG 24 HR TABLET    Take 25 mg by mouth daily.   MULTIPLE VITAMIN (MULTIVITAMIN WITH MINERALS) TABS TABLET    Take 1 tablet by mouth daily.    MULTIPLE VITAMINS-MINERALS (PRESERVISION/LUTEIN) CAPS    Take 1 capsule by mouth 2 (two) times daily.    OMEPRAZOLE (PRILOSEC) 20 MG CAPSULE    Take 20 mg by mouth daily.    PANTOPRAZOLE (PROTONIX) 40 MG TABLET    Take 40 mg by mouth daily.   SENNA (SENOKOT) 8.6 MG TABLET    Take 1 tablet by mouth daily.   VITAMIN B-12 (CYANOCOBALAMIN) 1000 MCG TABLET    Take 1,000 mcg by mouth daily.   ZOLPIDEM (AMBIEN) 10 MG TABLET    Take 10 mg by mouth at bedtime as needed for sleep.   Modified Medications   Modified Medication Previous Medication   METHOCARBAMOL (ROBAXIN) 500 MG TABLET methocarbamol (ROBAXIN) 500 MG tablet      Take 1 tablet (500 mg total) by mouth 2 (two) times daily as needed for muscle spasms.    Take 1 tablet (500 mg total) by mouth 2 (two) times daily as needed for muscle spasms.   TRAMADOL (ULTRAM) 50 MG TABLET traMADol (ULTRAM) 50 MG tablet      Take 1  tablet (50 mg total) by mouth 4 (four) times daily.    Take by mouth 3 (three) times daily.  Discontinued Medications   No medications on file   ----------------------------------------------------------------------------------------------------------------------  Follow-up:  Return to clinic in 2 months  Molli Barrows, MD

## 2020-07-18 DIAGNOSIS — M7581 Other shoulder lesions, right shoulder: Secondary | ICD-10-CM | POA: Diagnosis not present

## 2020-07-18 DIAGNOSIS — M19011 Primary osteoarthritis, right shoulder: Secondary | ICD-10-CM | POA: Diagnosis not present

## 2020-07-22 DIAGNOSIS — C61 Malignant neoplasm of prostate: Secondary | ICD-10-CM | POA: Diagnosis not present

## 2020-07-22 DIAGNOSIS — R8271 Bacteriuria: Secondary | ICD-10-CM | POA: Diagnosis not present

## 2020-07-22 DIAGNOSIS — R3121 Asymptomatic microscopic hematuria: Secondary | ICD-10-CM | POA: Diagnosis not present

## 2020-08-18 ENCOUNTER — Ambulatory Visit: Payer: PPO | Attending: Internal Medicine

## 2020-08-18 DIAGNOSIS — Z23 Encounter for immunization: Secondary | ICD-10-CM

## 2020-08-18 NOTE — Progress Notes (Signed)
   [id number]  Name:  Rick Mcbride.    MRN: [id number] DOB: Jul 21, 1941  08/18/2020  [name] was observed post Covid-19 immunization for 15 minutes without incident. He was provided with Vaccine Information Sheet and instruction to access the V-Safe system.   [name] was instructed to call 911 with any severe reactions post vaccine: Marland Kitchen Difficulty breathing  . Swelling of face and throat  . A fast heartbeat  . A bad rash all over body  . Dizziness and weakness

## 2020-08-21 DIAGNOSIS — R809 Proteinuria, unspecified: Secondary | ICD-10-CM | POA: Diagnosis not present

## 2020-08-21 DIAGNOSIS — N184 Chronic kidney disease, stage 4 (severe): Secondary | ICD-10-CM | POA: Diagnosis not present

## 2020-08-21 DIAGNOSIS — I129 Hypertensive chronic kidney disease with stage 1 through stage 4 chronic kidney disease, or unspecified chronic kidney disease: Secondary | ICD-10-CM | POA: Diagnosis not present

## 2020-08-21 DIAGNOSIS — N2581 Secondary hyperparathyroidism of renal origin: Secondary | ICD-10-CM | POA: Diagnosis not present

## 2020-08-21 DIAGNOSIS — R319 Hematuria, unspecified: Secondary | ICD-10-CM | POA: Diagnosis not present

## 2020-08-21 DIAGNOSIS — D631 Anemia in chronic kidney disease: Secondary | ICD-10-CM | POA: Diagnosis not present

## 2020-08-25 DIAGNOSIS — R8271 Bacteriuria: Secondary | ICD-10-CM | POA: Diagnosis not present

## 2020-08-26 DIAGNOSIS — L821 Other seborrheic keratosis: Secondary | ICD-10-CM | POA: Diagnosis not present

## 2020-08-26 DIAGNOSIS — D225 Melanocytic nevi of trunk: Secondary | ICD-10-CM | POA: Diagnosis not present

## 2020-08-26 DIAGNOSIS — B352 Tinea manuum: Secondary | ICD-10-CM | POA: Diagnosis not present

## 2020-08-26 DIAGNOSIS — Z08 Encounter for follow-up examination after completed treatment for malignant neoplasm: Secondary | ICD-10-CM | POA: Diagnosis not present

## 2020-08-26 DIAGNOSIS — D2261 Melanocytic nevi of right upper limb, including shoulder: Secondary | ICD-10-CM | POA: Diagnosis not present

## 2020-08-26 DIAGNOSIS — Z85828 Personal history of other malignant neoplasm of skin: Secondary | ICD-10-CM | POA: Diagnosis not present

## 2020-08-26 DIAGNOSIS — I495 Sick sinus syndrome: Secondary | ICD-10-CM | POA: Diagnosis not present

## 2020-08-26 DIAGNOSIS — B353 Tinea pedis: Secondary | ICD-10-CM | POA: Diagnosis not present

## 2020-08-28 DIAGNOSIS — D631 Anemia in chronic kidney disease: Secondary | ICD-10-CM | POA: Diagnosis not present

## 2020-08-28 DIAGNOSIS — I129 Hypertensive chronic kidney disease with stage 1 through stage 4 chronic kidney disease, or unspecified chronic kidney disease: Secondary | ICD-10-CM | POA: Diagnosis not present

## 2020-08-28 DIAGNOSIS — N184 Chronic kidney disease, stage 4 (severe): Secondary | ICD-10-CM | POA: Diagnosis not present

## 2020-08-28 DIAGNOSIS — R809 Proteinuria, unspecified: Secondary | ICD-10-CM | POA: Diagnosis not present

## 2020-08-28 DIAGNOSIS — N2581 Secondary hyperparathyroidism of renal origin: Secondary | ICD-10-CM | POA: Diagnosis not present

## 2020-08-28 DIAGNOSIS — R311 Benign essential microscopic hematuria: Secondary | ICD-10-CM | POA: Diagnosis not present

## 2020-08-29 DIAGNOSIS — H353132 Nonexudative age-related macular degeneration, bilateral, intermediate dry stage: Secondary | ICD-10-CM | POA: Diagnosis not present

## 2020-09-05 ENCOUNTER — Ambulatory Visit: Payer: PPO

## 2020-09-27 ENCOUNTER — Other Ambulatory Visit: Payer: Self-pay | Admitting: Anesthesiology

## 2020-10-15 DIAGNOSIS — R739 Hyperglycemia, unspecified: Secondary | ICD-10-CM | POA: Diagnosis not present

## 2020-10-15 DIAGNOSIS — J449 Chronic obstructive pulmonary disease, unspecified: Secondary | ICD-10-CM | POA: Diagnosis not present

## 2020-10-15 DIAGNOSIS — I700 Atherosclerosis of aorta: Secondary | ICD-10-CM | POA: Insufficient documentation

## 2020-10-15 DIAGNOSIS — I25810 Atherosclerosis of coronary artery bypass graft(s) without angina pectoris: Secondary | ICD-10-CM | POA: Diagnosis not present

## 2020-10-15 DIAGNOSIS — I10 Essential (primary) hypertension: Secondary | ICD-10-CM | POA: Diagnosis not present

## 2020-10-15 DIAGNOSIS — Z125 Encounter for screening for malignant neoplasm of prostate: Secondary | ICD-10-CM | POA: Diagnosis not present

## 2020-10-15 DIAGNOSIS — E782 Mixed hyperlipidemia: Secondary | ICD-10-CM | POA: Diagnosis not present

## 2020-10-15 DIAGNOSIS — K635 Polyp of colon: Secondary | ICD-10-CM | POA: Diagnosis not present

## 2020-10-15 DIAGNOSIS — Z0000 Encounter for general adult medical examination without abnormal findings: Secondary | ICD-10-CM | POA: Diagnosis not present

## 2020-10-15 DIAGNOSIS — Z79899 Other long term (current) drug therapy: Secondary | ICD-10-CM | POA: Diagnosis not present

## 2020-10-17 DIAGNOSIS — M7581 Other shoulder lesions, right shoulder: Secondary | ICD-10-CM | POA: Diagnosis not present

## 2020-10-17 DIAGNOSIS — M19011 Primary osteoarthritis, right shoulder: Secondary | ICD-10-CM | POA: Diagnosis not present

## 2020-10-17 DIAGNOSIS — Z96611 Presence of right artificial shoulder joint: Secondary | ICD-10-CM | POA: Diagnosis not present

## 2020-10-23 DIAGNOSIS — I10 Essential (primary) hypertension: Secondary | ICD-10-CM | POA: Diagnosis not present

## 2020-10-23 DIAGNOSIS — E782 Mixed hyperlipidemia: Secondary | ICD-10-CM | POA: Diagnosis not present

## 2020-10-23 DIAGNOSIS — I700 Atherosclerosis of aorta: Secondary | ICD-10-CM | POA: Diagnosis not present

## 2020-10-23 DIAGNOSIS — I25810 Atherosclerosis of coronary artery bypass graft(s) without angina pectoris: Secondary | ICD-10-CM | POA: Diagnosis not present

## 2020-10-23 DIAGNOSIS — I712 Thoracic aortic aneurysm, without rupture: Secondary | ICD-10-CM | POA: Diagnosis not present

## 2020-10-23 DIAGNOSIS — I714 Abdominal aortic aneurysm, without rupture: Secondary | ICD-10-CM | POA: Diagnosis not present

## 2020-10-23 DIAGNOSIS — I495 Sick sinus syndrome: Secondary | ICD-10-CM | POA: Diagnosis not present

## 2020-10-23 DIAGNOSIS — I441 Atrioventricular block, second degree: Secondary | ICD-10-CM | POA: Diagnosis not present

## 2020-10-24 ENCOUNTER — Other Ambulatory Visit: Payer: Self-pay | Admitting: Cardiology

## 2020-10-24 DIAGNOSIS — I712 Thoracic aortic aneurysm, without rupture, unspecified: Secondary | ICD-10-CM

## 2020-10-27 ENCOUNTER — Other Ambulatory Visit: Payer: Self-pay | Admitting: Anesthesiology

## 2020-10-28 ENCOUNTER — Ambulatory Visit: Payer: PPO | Attending: Anesthesiology | Admitting: Anesthesiology

## 2020-10-28 ENCOUNTER — Other Ambulatory Visit: Payer: Self-pay

## 2020-10-28 ENCOUNTER — Encounter: Payer: Self-pay | Admitting: Anesthesiology

## 2020-10-28 DIAGNOSIS — M961 Postlaminectomy syndrome, not elsewhere classified: Secondary | ICD-10-CM

## 2020-10-28 DIAGNOSIS — M5136 Other intervertebral disc degeneration, lumbar region: Secondary | ICD-10-CM

## 2020-10-28 DIAGNOSIS — R29898 Other symptoms and signs involving the musculoskeletal system: Secondary | ICD-10-CM | POA: Diagnosis not present

## 2020-10-28 DIAGNOSIS — M25562 Pain in left knee: Secondary | ICD-10-CM | POA: Diagnosis not present

## 2020-10-28 DIAGNOSIS — G8929 Other chronic pain: Secondary | ICD-10-CM

## 2020-10-28 DIAGNOSIS — M5432 Sciatica, left side: Secondary | ICD-10-CM

## 2020-10-28 DIAGNOSIS — M48062 Spinal stenosis, lumbar region with neurogenic claudication: Secondary | ICD-10-CM | POA: Diagnosis not present

## 2020-10-28 DIAGNOSIS — M25511 Pain in right shoulder: Secondary | ICD-10-CM | POA: Diagnosis not present

## 2020-10-28 MED ORDER — METHOCARBAMOL 500 MG PO TABS: 500.0000 mg | ORAL_TABLET | Freq: Two times a day (BID) | ORAL | 5 refills | Status: AC | PRN

## 2020-10-28 MED ORDER — TRAMADOL HCL 50 MG PO TABS: 50.0000 mg | ORAL_TABLET | Freq: Four times a day (QID) | ORAL | 2 refills | Status: AC | PRN

## 2020-10-28 NOTE — Progress Notes (Signed)
Virtual Visit via Telephone Note  I connected with Rick Mcbride. on 10/28/20 at  3:00 PM EST by telephone and verified that I am speaking with the correct person using two identifiers.  Location: Patient: Home Provider: [facility]   I discussed the limitations, risks, security and privacy concerns of performing an evaluation and management service by telephone and the availability of in person appointments. I also discussed with the patient that there may be a patient responsible charge related to this service. The patient expressed understanding and agreed to proceed.   History of Present Illness:   I spoke with Rick Mcbride today via telephone as he was unable to do the video portion of the virtual conference.  He reports that his condition is stable and his low back pain is manageable.  The quality characteristic and distribution of this have been stable in nature with no reported changes recently.  He is taking tramadol 50 mg tablets and is using half of these approximately every 4 hours and this method keeps his pain under good control and keeps him functional.  No side effects reported and he continues to derive good functional benefit from this.  He is taking Robaxin 500 mg twice a day and doing well.  He is trying to do stretching strengthening exercises as best he can and stay active.  This is a problem with Covid he reports.  Otherwise he is in his usual state of health with no changes in lower extremity strength or function or bowel or bladder function. Observations/Objective:  Current Outpatient Medications:  .  acetaminophen (TYLENOL) 500 MG tablet, Take 1,000 mg by mouth 2 (two) times daily. , Disp: , Rfl:  .  amLODipine (NORVASC) 5 MG tablet, Take 1 tablet (5 mg total) by mouth daily. (Patient not taking: Reported on 01/17/2019), Disp: 7 tablet, Rfl: 0 .  ascorbic acid (VITAMIN C) 1000 MG tablet, Take by mouth., Disp: , Rfl:  .  aspirin EC 81 MG tablet, Take 81 mg by  mouth daily. , Disp: , Rfl:  .  atorvastatin (LIPITOR) 20 MG tablet, Take 40 mg by mouth at bedtime., Disp: , Rfl:  .  Calcium Carbonate-Vitamin D 600-200 MG-UNIT CAPS, Take by mouth., Disp: , Rfl:  .  Calcium Carbonate-Vitamin D3 (CALCIUM 600-D) 600-400 MG-UNIT TABS, Take 1 tablet by mouth daily at 12 noon. , Disp: , Rfl:  .  carisoprodol (SOMA) 350 MG tablet, Take 350 mg by mouth every 6 (six) hours as needed for muscle spasms.  (Patient not taking: Reported on 06/02/2020), Disp: , Rfl:  .  cetirizine (ZYRTEC) 10 MG tablet, Take 10 mg by mouth daily., Disp: , Rfl:  .  Chlorpheniramine Maleate (CHLORPHEN SR) 12 MG TBCR, Take 12 mg by mouth daily. (Patient not taking: Reported on 06/02/2020), Disp: , Rfl:  .  clopidogrel (PLAVIX) 75 MG tablet, Take 75 mg by mouth daily. , Disp: , Rfl:  .  clotrimazole (LOTRIMIN) 1 % cream, Apply 1 application topically 2 (two) times daily., Disp: , Rfl:  .  Docusate Sodium (DSS) 100 MG CAPS, Take by mouth., Disp: , Rfl:  .  donepezil (ARICEPT) 5 MG tablet, Take 5 mg by mouth at bedtime., Disp: , Rfl:  .  ezetimibe (ZETIA) 10 MG tablet, Take 10 mg by mouth daily., Disp: , Rfl:  .  ferrous sulfate (SLOW FE) 160 (50 FE) MG TBCR SR tablet, Take 1 tablet by mouth in the morning and at bedtime. , Disp: ,  Rfl:  .  fluticasone (FLONASE) 50 MCG/ACT nasal spray, Place 2 sprays into both nostrils 2 (two) times daily. , Disp: , Rfl:  .  losartan (COZAAR) 100 MG tablet, Take 100 mg by mouth daily., Disp: , Rfl:  .  Melatonin 10 MG TABS, Take 10 mg by mouth at bedtime. , Disp: , Rfl:  .  methocarbamol (ROBAXIN) 500 MG tablet, Take 1 tablet (500 mg total) by mouth 2 (two) times daily as needed for muscle spasms., Disp: 60 tablet, Rfl: 5 .  metoprolol succinate (TOPROL-XL) 25 MG 24 hr tablet, Take 25 mg by mouth daily., Disp: , Rfl:  .  Multiple Vitamin (MULTIVITAMIN WITH MINERALS) TABS tablet, Take 1 tablet by mouth daily. , Disp: , Rfl:  .  Multiple Vitamins-Minerals  (PRESERVISION/LUTEIN) CAPS, Take 1 capsule by mouth 2 (two) times daily. , Disp: , Rfl:  .  omeprazole (PRILOSEC) 20 MG capsule, Take 20 mg by mouth daily.  (Patient not taking: Reported on 06/02/2020), Disp: , Rfl:  .  pantoprazole (PROTONIX) 40 MG tablet, Take 40 mg by mouth daily., Disp: , Rfl:  .  senna (SENOKOT) 8.6 MG tablet, Take 1 tablet by mouth daily., Disp: , Rfl:  .  traMADol (ULTRAM) 50 MG tablet, Take 1 tablet (50 mg total) by mouth every 6 (six) hours as needed., Disp: 120 tablet, Rfl: 2 .  vitamin B-12 (CYANOCOBALAMIN) 1000 MCG tablet, Take 1,000 mcg by mouth daily., Disp: , Rfl:  .  zolpidem (AMBIEN) 10 MG tablet, Take 10 mg by mouth at bedtime as needed for sleep. , Disp: , Rfl:   Assessment and Plan: 1. DDD (degenerative disc disease), lumbar   2. Failed back surgical syndrome   3. Sciatica of left side   4. Weakness of both legs   5. Spinal stenosis, lumbar region, with neurogenic claudication   6. Arthralgia of left lower leg   7. Chronic right shoulder pain   Based on our discussion today upon review of the [facility] practitioner database information going to refill his tramadol for the next 3 months in addition to his Robaxin.  No other changes are made in his medication regimen.  I encouraged him to stay active and continue with his stretching strengthening exercises with a schedule return in 3 months.  He is instructed to continue follow-up with his primary care physicians for his baseline medical care.  Follow Up Instructions:    I discussed the assessment and treatment plan with the patient. The patient was provided an opportunity to ask questions and all were answered. The patient agreed with the plan and demonstrated an understanding of the instructions.   The patient was advised to call back or seek an in-person evaluation if the symptoms worsen or if the condition fails to improve as anticipated.  I provided 30 minutes of non-face-to-face time during this  encounter.   Molli Barrows, MD

## 2020-11-04 ENCOUNTER — Encounter (INDEPENDENT_AMBULATORY_CARE_PROVIDER_SITE_OTHER): Payer: Self-pay | Admitting: Vascular Surgery

## 2020-11-04 ENCOUNTER — Ambulatory Visit (INDEPENDENT_AMBULATORY_CARE_PROVIDER_SITE_OTHER): Payer: PPO

## 2020-11-04 ENCOUNTER — Ambulatory Visit (INDEPENDENT_AMBULATORY_CARE_PROVIDER_SITE_OTHER): Payer: PPO | Admitting: Vascular Surgery

## 2020-11-04 ENCOUNTER — Other Ambulatory Visit: Payer: Self-pay

## 2020-11-04 ENCOUNTER — Ambulatory Visit
Admission: RE | Admit: 2020-11-04 | Discharge: 2020-11-04 | Disposition: A | Discharge status: Home / Self Care | Payer: PPO | Source: Ambulatory Visit | Attending: Cardiology | Admitting: Cardiology

## 2020-11-04 VITALS — BP 138/84 | HR 78 | Resp 17 | Ht 69.0 in | Wt 218.0 lb

## 2020-11-04 DIAGNOSIS — G8929 Other chronic pain: Secondary | ICD-10-CM | POA: Diagnosis not present

## 2020-11-04 DIAGNOSIS — I10 Essential (primary) hypertension: Secondary | ICD-10-CM

## 2020-11-04 DIAGNOSIS — I714 Abdominal aortic aneurysm, without rupture, unspecified: Secondary | ICD-10-CM

## 2020-11-04 DIAGNOSIS — M545 Low back pain, unspecified: Secondary | ICD-10-CM

## 2020-11-04 DIAGNOSIS — I712 Thoracic aortic aneurysm, without rupture, unspecified: Secondary | ICD-10-CM

## 2020-11-04 DIAGNOSIS — I119 Hypertensive heart disease without heart failure: Secondary | ICD-10-CM | POA: Diagnosis not present

## 2020-11-04 DIAGNOSIS — Z85118 Personal history of other malignant neoplasm of bronchus and lung: Secondary | ICD-10-CM | POA: Diagnosis not present

## 2020-11-04 DIAGNOSIS — J432 Centrilobular emphysema: Secondary | ICD-10-CM | POA: Diagnosis not present

## 2020-11-04 NOTE — Assessment & Plan Note (Signed)
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  No evidence of endoleak was seen on duplex. No changes.  Continue to follow this on an annual basis.

## 2020-11-04 NOTE — Progress Notes (Signed)
MRN : [id number]  Rick Mcbride. is a 79 y.o. (10/11/1941) male who presents with chief complaint of  Chief Complaint  Patient presents with  . AAA  . Follow-up    ultrasound  .  History of Present Illness: Patient returns today in follow up of his aneurysm.  He is almost 4 years status post endovascular abdominal aortic aneurysm repair.  He is doing well.  He has had chronic low back issues but nothing new or worrisome.  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  No evidence of endoleak was seen on duplex.  Current Outpatient Medications  Medication Sig Dispense Refill  . acetaminophen (TYLENOL) 500 MG tablet Take 1,000 mg by mouth 2 (two) times daily.     Marland Kitchen amLODipine (NORVASC) 5 MG tablet Take 1 tablet (5 mg total) by mouth daily. 7 tablet 0  . ascorbic acid (VITAMIN C) 1000 MG tablet Take by mouth.    Marland Kitchen aspirin EC 81 MG tablet Take 81 mg by mouth daily.     Marland Kitchen atorvastatin (LIPITOR) 20 MG tablet Take 40 mg by mouth at bedtime.    . Calcium Carb-Cholecalciferol (CALCIUM CARBONATE-VITAMIN D3) 600-400 MG-UNIT TABS Take 1 tablet by mouth daily at 12 noon.     . Calcium Carbonate-Vitamin D 600-200 MG-UNIT CAPS Take by mouth.    . carisoprodol (SOMA) 350 MG tablet Take 350 mg by mouth every 6 (six) hours as needed for muscle spasms.    . cetirizine (ZYRTEC) 10 MG tablet Take 10 mg by mouth daily.    . Chlorpheniramine Maleate 12 MG TBCR Take 12 mg by mouth daily.    . clopidogrel (PLAVIX) 75 MG tablet Take 75 mg by mouth daily.     . clotrimazole (LOTRIMIN) 1 % cream Apply 1 application topically 2 (two) times daily.    Mariane Baumgarten Sodium (DSS) 100 MG CAPS Take by mouth.    . donepezil (ARICEPT) 5 MG tablet Take 5 mg by mouth at bedtime.    Marland Kitchen econazole nitrate 1 % cream Apply topically 3 (three) times daily.    . ferrous sulfate (SLOW FE) 160 (50 FE) MG TBCR SR tablet Take 1 tablet by mouth in the morning and at  bedtime.     . fluticasone (FLONASE) 50 MCG/ACT nasal spray Place 2 sprays into both nostrils 2 (two) times daily.     Marland Kitchen losartan (COZAAR) 100 MG tablet Take 100 mg by mouth daily.    . Melatonin 10 MG TABS Take 10 mg by mouth at bedtime.     . methocarbamol (ROBAXIN) 500 MG tablet Take 1 tablet (500 mg total) by mouth 2 (two) times daily as needed for muscle spasms. 60 tablet 5  . metoprolol succinate (TOPROL-XL) 25 MG 24 hr tablet Take 25 mg by mouth daily.    . Multiple Vitamin (MULTIVITAMIN WITH MINERALS) TABS tablet Take 1 tablet by mouth daily.     . Multiple Vitamins-Minerals (PRESERVISION/LUTEIN) CAPS Take 1 capsule by mouth 2 (two) times daily.     . pantoprazole (PROTONIX) 40 MG tablet Take 40 mg by mouth daily.    . traMADol (ULTRAM) 50 MG tablet Take 1 tablet (50 mg total) by mouth every 6 (six) hours as needed. 120 tablet 2  . vitamin B-12 (CYANOCOBALAMIN) 1000 MCG tablet Take 1,000 mcg by mouth daily.    Marland Kitchen zolpidem (AMBIEN) 10 MG tablet Take 10 mg by  mouth at bedtime as needed for sleep.     Marland Kitchen econazole nitrate 1 % cream Apply topically 3 (three) times daily.    Marland Kitchen econazole nitrate 1 % cream Apply topically 3 (three) times daily.    Marland Kitchen econazole nitrate 1 % cream Apply topically 3 (three) times daily.    Marland Kitchen econazole nitrate 1 % cream Apply topically 3 (three) times daily.    Marland Kitchen ezetimibe (ZETIA) 10 MG tablet Take 10 mg by mouth daily.    Marland Kitchen omeprazole (PRILOSEC) 20 MG capsule Take 20 mg by mouth daily.  (Patient not taking: No sig reported)    . senna (SENOKOT) 8.6 MG tablet Take 1 tablet by mouth daily.     No current facility-administered medications for this visit.    Past Medical History:  Diagnosis Date  . AAA (abdominal aortic aneurysm) ([place])   . AAA (abdominal aortic aneurysm) without rupture ([place])   . Anemia   . Aneurysm ([place])    abd aortic  . Anxiety   . Atrophic kidney   . Cervical radiculopathy   . Chronic airway obstruction (HCC)    not aware of this  .  Chronic kidney disease (CKD), stage III (moderate) (HCC)    followed by Dr. Johnny Bridge  . Chronic right shoulder pain 11/12/2019  . Chronic tension headaches   . Coronary artery disease   . Coronary atherosclerosis of autologous vein bypass graft   . DDD (degenerative disc disease), lumbar   . Degenerative disc disease, lumbar    with lumbar radiculopathy  . Dyspnea   . Elbow fracture, left   . GERD (gastroesophageal reflux disease)   . H/O adenomatous polyp of colon   . H/O hemorrhoids   . H/O urticaria   . Headache   . Heart disease   . Hypercholesteremia   . Hyperlipidemia   . 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 Lucky Cowboy  . Lung cancer ([place])   . Lung cancer ([place])   . Meralgia paresthetica   . Meralgia paresthetica   . Neuralgia   . Osteoarthritis   . Osteoarthritis    s/p L knee surgery  . 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 Presence of permanent cardiac pacemaker   . Prostate cancer ([place])   . Second degree AV block    Followed by Dr. Nehemiah Massed  . Sinoatrial node dysfunction (HCC)   . Status post partial lobectomy of lung    bottom right   . Stroke ([place])   . TIA (transient ischemic attack)     Past Surgical History:  Procedure Laterality Date  . CATARACT EXTRACTION    . COLONOSCOPY    . COLONOSCOPY    . COLONOSCOPY WITH PROPOFOL N/A 10/20/2015   Procedure: COLONOSCOPY WITH PROPOFOL;  Surgeon: Manya Silvas, MD;  Location: [facility] ENDOSCOPY;  Service: Endoscopy;  Laterality: N/A;  . CORONARY ARTERY BYPASS GRAFT     triple  . coronary atherosclerosis of autologous vein bypass graft    . EMBOLIZATION Right 12/27/2016   Procedure: Embolization;  Surgeon: Algernon Huxley, MD;  Location: [place] [facility];  Service: Cardiovascular;  Laterality: Right;  . ENDOVASCULAR REPAIR/STENT GRAFT N/A 01/05/2017   Procedure: Endovascular Repair/Stent Graft;  Surgeon: Algernon Huxley, MD;  Location: [place] [facility]  [facility];  Service: Cardiovascular;  Laterality: N/A;  . EYE SURGERY Bilateral    cataract extraction  .  JOINT REPLACEMENT     shoulder and knees  . KNEE ARTHROSCOPY    . LOBECTOMY  01/31/13   RLL w/squamous cell carcinoma lobectomy  . LUNG REMOVAL, PARTIAL  2014   right lower lobe  . PACEMAKER INSERTION    . PACEMAKER INSERTION  12/2012   Dual chanber pacemaker generator  . partial shoulder replacement Right   . POLYPECTOMY    . PROSTATECTOMY    . TOTAL KNEE ARTHROPLASTY Bilateral   . TOTAL SHOULDER ARTHROPLASTY Left 07/10/2015   Procedure: TOTAL SHOULDER ARTHROPLASTY;  Surgeon: [name], MD;  Location: [facility] ORS;  Service: Orthopedics;  Laterality: Left;  . TOTAL SHOULDER REPLACEMENT       Social History   Tobacco Use  . Smoking status: Former Smoker    Packs/day: 1.50    Years: 45.00    Pack years: 67.50    Quit date: 04/07/2004    Years since quitting: 16.5  . Smokeless tobacco: Never Used  Vaping Use  . Vaping Use: Never used  Substance Use Topics  . Alcohol use: No  . Drug use: No      Family History  Problem Relation Age of Onset  . Heart attack Mother   . Heart attack Father   . Breast cancer Sister   . Asthma Sister      No Known Allergies  REVIEW OF SYSTEMS(Negative unless checked)  Constitutional: [] ???Weight loss[] ???Fever[] ???Chills Cardiac:[] ???Chest pain[] ???Chest pressure[] ???Palpitations [] ???Shortness of breath when laying flat [] ???Shortness of breath at rest [] ???Shortness of breath with exertion. Vascular: [] ???Pain in legs with walking[] ???Pain in legsat rest[] ???Pain in legs when laying flat [x] ???Claudication [] ???Pain in feet when walking [] ???Pain in feet at rest [] ???Pain in feet when laying flat [] ???History of DVT [] ???Phlebitis [] ???Swelling in legs [] ???Varicose veins [] ???Non-healing ulcers Pulmonary: [] ???Uses home oxygen [] ???Productive cough[] ???Hemoptysis [] ???Wheeze [] ???COPD  [] ???Asthma Neurologic: [] ???Dizziness [] ???Blackouts [] ???Seizures [] ???History of stroke [] ???History of TIA[] ???Aphasia [] ???Temporary blindness[] ???Dysphagia [] ???Weaknessor numbness in arms [] ???Weakness or numbnessin legs Musculoskeletal: [x] ???Arthritis [] ???Joint swelling [] ???Joint pain [x] ???Low back pain Hematologic:[] ???Easy bruising[] ???Easy bleeding [] ???Hypercoagulable state [] ???Anemic  Gastrointestinal:[] ???Blood in stool[] ???Vomiting blood[] ???Gastroesophageal reflux/heartburn[] ???Abdominal pain Genitourinary: [] ???Chronic kidney disease [] ???Difficulturination [x] ???Frequenturination [] ???Burning with urination[] ???Hematuria Skin: [] ???Rashes [] ???Ulcers [] ???Wounds Psychological: [] ???History of anxiety[] ???History of major depression.   Physical Examination  BP 138/84 (BP Location: Right Arm)   Pulse 78   Resp 17   Ht 5\' 9"  (1.753 m)   Wt 218 lb (98.9 kg)   BMI 32.19 kg/m  Gen:  WD/WN, NAD Head: [place]/AT, No temporalis wasting. Ear/Nose/Throat: Hearing grossly intact, nares w/o erythema or drainage Eyes: Conjunctiva clear. Sclera non-icteric Neck: Supple.  Trachea midline Pulmonary:  Good air movement, no use of accessory muscles.  Cardiac: RRR, no JVD Vascular:  Vessel Right Left  Radial Palpable Palpable       Musculoskeletal: M/S 5/5 throughout.  No deformity or atrophy. No edema. Neurologic: Sensation grossly intact in extremities.  Symmetrical.  Speech is fluent.  Psychiatric: Judgment intact, Mood & affect appropriate for pt's clinical situation. Dermatologic: No rashes or ulcers noted.  No cellulitis or open wounds.       Labs No results found for this or any previous visit (from the past 2160 hour(s)).  Radiology No results found.  Assessment/Plan Low back pain Has had back surgery after his aneurysm repair which may have stabilized his symptoms somewhat, but he still  remains symptomatic  Essential (primary) hypertension blood pressure control important in reducing the progression of atherosclerotic diseaseand aneurysmal degeneration. On appropriate oral medications.  AAA (abdominal aortic aneurysm) without  rupture ([place])  His duplex today shows a slight decrease in the aortic sac size now measuring 6.2 cm in maximal diameter.  No evidence of endoleak was seen on duplex. No changes.  Continue to follow this on an annual basis.    [name], MD  11/04/2020 10:17 AM    This note was created with Dragon medical transcription system.  Any errors from dictation are purely unintentional

## 2020-11-13 DIAGNOSIS — Z86010 Personal history of colonic polyps: Secondary | ICD-10-CM | POA: Diagnosis not present

## 2020-11-14 ENCOUNTER — Other Ambulatory Visit: Payer: Self-pay | Admitting: General Surgery

## 2020-11-14 NOTE — Progress Notes (Signed)
Subjective:     Patient ID: Rick Mcbride is a 80 y.o. male.  HPI  The following portions of the patient's history were reviewed and updated as appropriate.  This a new patient is here today for: office visit. He is here to discuss having a colonoscopy, last in 2016. He denies any GI issues, bowels move q 2-3 days with the use of Sen[place]ot. He denies any bleeding. He is here with his wife, [name].  The patient's wife reported that the patient had a CVA when his aspirin dose was held for procedure and he developed short-term memory loss after this event.  They have stop Plavix several times in the past for procedures without incident.  Review of Systems  Constitutional: Negative for chills and fever.  Respiratory: Negative for cough.        Chief Complaint  Patient presents with  . Follow-up     BP 122/68   Pulse 72   Temp 36.8 C (98.3 F)   Ht 177.8 cm (5\' 10" )   Wt 99.8 kg (220 lb)   SpO2 100%   BMI 31.57 kg/m   Past Medical History:  Diagnosis Date  . AAA (abdominal aortic aneurysm) (CMS-HCC)   . Anemia    Anemia of chronic disease  . Atrophic kidney   . Chronic kidney disease    stage III,  followed by Dr. Johnny Bridge.  . Chronic tension headaches   . Coronary atherosclerosis of autologous vein bypass graft   . DDD (degenerative disc disease)    with lumbar radiculopathy  . GERD (gastroesophageal reflux disease)   . H/O adenomatous polyp of colon 01/15/2015  . Heart disease    abdominal aortic aneurysm  . History of anxiety   . History of hemorrhoids   . History of urticaria   . Hypertension   . Iliac aneurysm (CMS-HCC)    followed by Dr. Lucky Cowboy  . Left elbow fracture   . Lung cancer (CMS-HCC)    S/P right upper lobectomy.  . Meralgia paresthetica    History of   . Neuralgia, neuritis, and radiculitis, unspecified   . Osteoarthritis    S/P left knee surgery  . Other and unspecified hyperlipidemia   . Pars defect of lumbar  spine    L5 bilateral with anteriolisthesis.  . Prostate cancer (CMS-HCC)   . Pure hypercholesterolemia   . Second degree AV block    followed by Dr. Nehemiah Massed  . Second degree AV block   . Sinoatrial node dysfunction (CMS-HCC)   . TIA (transient ischemic attack)           Past Surgical History:  Procedure Laterality Date  . CATARACT EXTRACTION Bilateral 12/2014  . COLONOSCOPY  06/26/2008   FH Colon Polyps (Mother)  . COLONOSCOPY  02/07/2014   31mm Adenomatous Polyp, FH Colon Polyps (Mother): CBF 02/2015  . COLONOSCOPY  10/20/2015   Adenomatous Polyps, FH Colon Polyps (Mother): CBF 10/2020  . CORONARY ARTERY BYPASS GRAFT    . INSERTION DUAL CHAMBER PACEMAKER GENERATOR  February 2014  . KNEE ARTHROSCOPY Bilateral   . Left total shoulder arthroplasty Left 07/10/2015   [name]  . 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 Nestor Lewandowsky.    Marland Kitchen POLYPECTOMY    . prostatectomy    . REPLACEMENT TOTAL KNEE BILATERAL  2008  . Right shoulder hemiarthroplasty  07/2011   Dr. Marry Guan  . shoulder scope Right 2009  . triple coronary   June 2005  artery bypass       Social History          Socioeconomic History  . Marital status: Married    Spouse name: Not on file  . Number of children: Not on file  . Years of education: Not on file  . Highest education level: Not on file  Occupational History  . Not on file  Tobacco Use  . Smoking status: Former Smoker    Packs/day: 1.50    Years: 45.00    Pack years: 67.50    Types: Pipe    Quit date: 04/07/2004    Years since quitting: 16.6  . Smokeless tobacco: Never Used  Vaping Use  . Vaping Use: Never used  Substance and Sexual Activity  . Alcohol use: Never  . Drug use: No  . Sexual activity: Yes    Partners: Female  Other Topics Concern  . Not on file  Social History Narrative  . Not on file   Social Determinants of Health   Financial  Resource Strain: Not on file  Food Insecurity: Not on file  Transportation Needs: Not on file       No Known Allergies  Current Medications        Current Outpatient Medications  Medication Sig Dispense Refill  . acetaminophen (TYLENOL) 500 MG tablet Take 1,000 mg by mouth every 8 (eight) hours as needed for Pain    . ascorbic acid, vitamin C, (VITAMIN C) 1000 MG tablet Take 1,000 mg by mouth once daily    . aspirin 81 MG EC tablet Take 81 mg by mouth once daily.    Marland Kitchen atorvastatin (LIPITOR) 20 MG tablet TAKE TWO TABLETS EVERY EVENING 60 tablet 5  . calcium carbonate-vitamin D3 600 mg calcium- 200 unit Cap Take by mouth Take 1 capsule by mouth every morning    . cetirizine (ZYRTEC) 10 MG tablet Take 1 tablet by mouth once daily    . clopidogreL (PLAVIX) 75 mg tablet TAKE 1 TABLET BY MOUTH DAILY 30 tablet 11  . clotrimazole-betamethasone (LOTRISONE) 1-0.05 % lotion Apply topically 2 (two) times daily as needed.    . cyanocobalamin, vitamin B-12, (VITAMIN B-12) 1,000 mcg TbER Take 1 tablet by mouth once daily    . donepeziL (ARICEPT) 5 MG tablet TAKE 1 TABLET BY MOUTH EVERY EVENING 30 tablet 5  . ezetimibe (ZETIA) 10 mg tablet TAKE ONE TABLET BY MOUTH EVERY DAY 30 tablet 11  . ferrous sulfate, dried 160 mg (50 mg iron) TbER Take by mouth Take 1 tablet by mouth 1 (one) time each day    . ipratropium (ATROVENT) 21 mcg (0.03 %) nasal spray TAKE 2 SPRAYS INTO EACH NOSTRIL TWICE DAILY AS NEEDED 30 mL 5  . losartan (COZAAR) 100 MG tablet Take 1 tablet by mouth once daily    . melatonin 10 mg Tab Take 1 tablet by mouth nightly    . methocarbamoL (ROBAXIN) 500 MG tablet Take 500 mg by mouth as needed    . metoprolol succinate (TOPROL-XL) 25 MG XL tablet TAKE ONE TABLET EVERY DAY 30 tablet 11  . MULTIVITAMIN W-MINERALS/LUTEIN (CENTRUM SILVER ORAL) Take 1 tablet by mouth once daily       . pantoprazole (PROTONIX) 40 MG DR tablet Take 1 tablet (40 mg total) by mouth once  daily 30 tablet 11  . sennosides (SENOKOT) 8.6 mg tablet Take by mouth Take 1 tablet by mouth daily    . traMADoL (ULTRAM) 50 mg tablet Take 50 mg  by mouth every 6 (six) hours as needed    . vit C-vit E-copper-ZnOx-lutein (PRESERVISION LUTEIN) 226-200-5-0.8 mg-unit-mg-mg Take 1 capsule by mouth 2 (two) times daily      . zolpidem (AMBIEN) 10 mg tablet TAKE 1 TABLET BY MOUTH NIGHTLY 30 tablet 5   No current facility-administered medications for this visit.           Family History  Problem Relation Age of Onset  . Heart disease Mother   . Myocardial Infarction (Heart attack) Mother   . Coronary Artery Disease (Blocked arteries around heart) Mother   . Colon polyps Mother   . Heart disease Father   . Myocardial Infarction (Heart attack) Father   . Coronary Artery Disease (Blocked arteries around heart) Father   . No Known Problems Daughter   . Mental retardation Son   . Breast cancer Other   . Colon cancer Neg Hx         Objective:   Physical Exam Constitutional:      Appearance: Normal appearance.  Cardiovascular:     Rate and Rhythm: Normal rate and regular rhythm.     Pulses: Normal pulses.     Heart sounds: Normal heart sounds.  Pulmonary:     Effort: Pulmonary effort is normal.     Breath sounds: Normal breath sounds.  Musculoskeletal:     Cervical back: Neck supple.  Skin:    General: Skin is warm and dry.  Neurological:     Mental Status: He is alert and oriented to person, place, and time.  Psychiatric:        Behavior: Behavior normal.    Labs and Radiology:   October 20, 2015 colonoscopy:  DIAGNOSIS:  A. COLON POLYP, PROXIMAL ASCENDING; COLD SNARE:  - HYPERPLASTIC POLYP.  - NEGATIVE FOR DYSPLASIA AND MALIGNANCY.   B. COLON POLYP, TRANSVERSE; COLD BIOPSY:  - TUBULAR ADENOMA.  - NEGATIVE FOR HIGH-GRADE DYSPLASIA AND MALIGNANCY.   C. COLON POLYP, TRANSVERSE; COLD BIOPSY:  - TUBULAR ADENOMA.  - NEGATIVE FOR HIGH-GRADE  DYSPLASIA AND MALIGNANCY.   D. COLON POLYP, TRANSVERSE; HOT SNARE:  - HYPERPLASTIC POLYP.  - NEGATIVE FOR DYSPLASIA AND MALIGNANCY.   E. COLON POLYP, TRANSVERSE; COLD BIOPSY:  - HYPERPLASTIC POLYP.       Assessment:     Candidate for repeat colonoscopy.    Plan:     Indications for the procedure based on 2 adenomatous polyps in 2016 were reviewed.  If he has a clear exam, he may not require additional colonoscopy exams.      Patient to be scheduled for a colonoscopy on 11-28-20 at [facility].   The patient has been asked to hold Plavix 5 days prior to procedure and continue his aspirin.   He will make use of Miralax and Dulcolax prep as instructed.   Entered by Karie Fetch, RN, acting as a scribe for Dr. Hervey Ard, MD.  The documentation recorded by the scribe accurately reflects the service I personally performed and the decisions made by me.   Robert Bellow, MD FACS

## 2020-11-19 DIAGNOSIS — Z79899 Other long term (current) drug therapy: Secondary | ICD-10-CM | POA: Diagnosis not present

## 2020-11-19 DIAGNOSIS — R739 Hyperglycemia, unspecified: Secondary | ICD-10-CM | POA: Diagnosis not present

## 2020-11-19 DIAGNOSIS — I10 Essential (primary) hypertension: Secondary | ICD-10-CM | POA: Diagnosis not present

## 2020-11-19 DIAGNOSIS — R8290 Unspecified abnormal findings in urine: Secondary | ICD-10-CM | POA: Diagnosis not present

## 2020-11-19 DIAGNOSIS — E782 Mixed hyperlipidemia: Secondary | ICD-10-CM | POA: Diagnosis not present

## 2020-11-19 DIAGNOSIS — Z125 Encounter for screening for malignant neoplasm of prostate: Secondary | ICD-10-CM | POA: Diagnosis not present

## 2020-11-26 ENCOUNTER — Other Ambulatory Visit: Payer: Self-pay

## 2020-11-26 ENCOUNTER — Other Ambulatory Visit
Admission: RE | Admit: 2020-11-26 | Discharge: 2020-11-26 | Disposition: A | Discharge status: Home / Self Care | Payer: PPO | Source: Ambulatory Visit | Attending: General Surgery | Admitting: General Surgery

## 2020-11-26 DIAGNOSIS — Z01818 Encounter for other preprocedural examination: Secondary | ICD-10-CM | POA: Diagnosis not present

## 2020-11-26 DIAGNOSIS — Z20822 Contact with and (suspected) exposure to covid-19: Secondary | ICD-10-CM | POA: Insufficient documentation

## 2020-11-26 LAB — SARS CORONAVIRUS 2 (TAT 6-24 HRS): SARS Coronavirus 2: NEGATIVE

## 2020-11-27 ENCOUNTER — Encounter: Payer: Self-pay | Admitting: General Surgery

## 2020-11-28 ENCOUNTER — Encounter
Admission: RE | Disposition: A | Discharge status: Home / Self Care | Payer: Self-pay | Source: Ambulatory Visit | Attending: General Surgery

## 2020-11-28 ENCOUNTER — Ambulatory Visit: Payer: PPO | Admitting: Anesthesiology

## 2020-11-28 ENCOUNTER — Encounter: Payer: Self-pay | Admitting: General Surgery

## 2020-11-28 ENCOUNTER — Other Ambulatory Visit: Payer: Self-pay

## 2020-11-28 ENCOUNTER — Ambulatory Visit: Admit: 2020-11-28 | Payer: PPO | Admitting: General Surgery

## 2020-11-28 ENCOUNTER — Ambulatory Visit
Admission: RE | Admit: 2020-11-28 | Discharge: 2020-11-28 | Disposition: A | Discharge status: Home / Self Care | Payer: PPO | Source: Ambulatory Visit | Attending: General Surgery | Admitting: General Surgery

## 2020-11-28 DIAGNOSIS — Z950 Presence of cardiac pacemaker: Secondary | ICD-10-CM | POA: Diagnosis not present

## 2020-11-28 DIAGNOSIS — Z1211 Encounter for screening for malignant neoplasm of colon: Secondary | ICD-10-CM | POA: Insufficient documentation

## 2020-11-28 DIAGNOSIS — Z96653 Presence of artificial knee joint, bilateral: Secondary | ICD-10-CM | POA: Insufficient documentation

## 2020-11-28 DIAGNOSIS — Z8673 Personal history of transient ischemic attack (TIA), and cerebral infarction without residual deficits: Secondary | ICD-10-CM | POA: Diagnosis not present

## 2020-11-28 DIAGNOSIS — Z87891 Personal history of nicotine dependence: Secondary | ICD-10-CM | POA: Diagnosis not present

## 2020-11-28 DIAGNOSIS — Z8546 Personal history of malignant neoplasm of prostate: Secondary | ICD-10-CM | POA: Insufficient documentation

## 2020-11-28 DIAGNOSIS — Z86010 Personal history of colonic polyps: Secondary | ICD-10-CM | POA: Diagnosis not present

## 2020-11-28 DIAGNOSIS — Z96612 Presence of left artificial shoulder joint: Secondary | ICD-10-CM | POA: Diagnosis not present

## 2020-11-28 DIAGNOSIS — Z79899 Other long term (current) drug therapy: Secondary | ICD-10-CM | POA: Diagnosis not present

## 2020-11-28 DIAGNOSIS — Z7902 Long term (current) use of antithrombotics/antiplatelets: Secondary | ICD-10-CM | POA: Diagnosis not present

## 2020-11-28 DIAGNOSIS — Z7982 Long term (current) use of aspirin: Secondary | ICD-10-CM | POA: Diagnosis not present

## 2020-11-28 HISTORY — PX: COLONOSCOPY WITH PROPOFOL: SHX5780

## 2020-11-28 SURGERY — COLONOSCOPY WITH PROPOFOL
Anesthesia: General

## 2020-11-28 MED ORDER — PROPOFOL 10 MG/ML IV BOLUS
INTRAVENOUS | Status: DC | PRN
  Administered 2020-11-28: 10 mg via INTRAVENOUS
  Administered 2020-11-28 (×2): 20 mg via INTRAVENOUS

## 2020-11-28 MED ORDER — SODIUM CHLORIDE 0.9 % IV SOLN
INTRAVENOUS | Status: DC
  Administered 2020-11-28: 1000 mL via INTRAVENOUS

## 2020-11-28 MED ORDER — LIDOCAINE HCL (PF) 2 % IJ SOLN
INTRAMUSCULAR | Status: AC
  Filled 2020-11-28: qty 5

## 2020-11-28 MED ORDER — PROPOFOL 500 MG/50ML IV EMUL
INTRAVENOUS | Status: DC | PRN
  Administered 2020-11-28: 25 ug/kg/min via INTRAVENOUS

## 2020-11-28 MED ORDER — FENTANYL CITRATE (PF) 100 MCG/2ML IJ SOLN
INTRAMUSCULAR | Status: AC
  Filled 2020-11-28: qty 6

## 2020-11-28 MED ORDER — MIDAZOLAM HCL 2 MG/2ML IJ SOLN
INTRAMUSCULAR | Status: AC
  Filled 2020-11-28: qty 2

## 2020-11-28 MED ORDER — FENTANYL CITRATE (PF) 100 MCG/2ML IJ SOLN
INTRAMUSCULAR | Status: DC | PRN
  Administered 2020-11-28 (×4): 25 ug via INTRAVENOUS

## 2020-11-28 MED ORDER — LIDOCAINE HCL (PF) 2 % IJ SOLN
INTRAMUSCULAR | Status: DC | PRN
  Administered 2020-11-28: 80 mg

## 2020-11-28 NOTE — Anesthesia Preprocedure Evaluation (Signed)
Anesthesia Evaluation  Patient identified by MRN, date of birth, ID band Patient awake    Reviewed: Allergy & Precautions, H&P , NPO status , Patient's Chart, lab work & pertinent test results  History of Anesthesia Complications Negative for: history of anesthetic complications  Airway Mallampati: III  TM Distance: >3 FB Neck ROM: full    Dental  (+) Upper Dentures, Lower Dentures   Pulmonary neg shortness of breath, sleep apnea , COPD, former smoker,    Pulmonary exam normal        Cardiovascular Exercise Tolerance: Good hypertension, (-) angina+ CAD, + CABG, + Peripheral Vascular Disease and +CHF (EF 40%)  (-) DOE Normal cardiovascular exam+ dysrhythmias + pacemaker + Valvular Problems/Murmurs MR      Neuro/Psych  Headaches, PSYCHIATRIC DISORDERS Anxiety TIA Neuromuscular disease CVA    GI/Hepatic Neg liver ROS, GERD  Controlled,  Endo/Other  negative endocrine ROS  Renal/GU Renal disease  negative genitourinary   Musculoskeletal  (+) Arthritis ,   Abdominal   Peds  Hematology negative hematology ROS (+)   Anesthesia Other Findings Past Medical History:   Heart disease                                                GERD (gastroesophageal reflux disease)                       Hyperlipidemia                                               Sleep apnea                                                  Hypertension                                                 Neuralgia                                                    Hypercholesteremia                                           Sinoatrial node dysfunction (HCC)                            DDD (degenerative disc disease), lumbar                      Meralgia paresthetica  Osteoarthritis                                               Anxiety                                                      Anemia                                                        Iliac aneurysm (HCC)                                         Iliac aneurysm (HCC)                                         Prostate cancer ([place])                                        Lung cancer ([place])                                            AAA (abdominal aortic aneurysm) ([place])                        Lung cancer ([place])                                            TIA (transient ischemic attack)                              Cervical radiculopathy                                       Stroke [facility])                                                 Coronary artery disease                                      Presence of permanent cardiac pacemaker                      AAA (abdominal aortic aneurysm) without ruptur*  Chronic airway obstruction (HCC)                             Atrophic kidney                                              Chronic kidney disease (CKD), stage III (moder*                Comment:followed by Dr. Johnny Bridge   Headache                                                     Chronic tension headaches                                    Coronary atherosclerosis of autologous vein by*              Degenerative disc disease, lumbar                              Comment:with lumbar radiculopathy   H/O adenomatous polyp of colon                               H/O hemorrhoids                                              H/O urticaria                                                Iliac aneurysm ([place])                                           Comment:followed by Dr. 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 Nehemiah Massed  Past Surgical History:   coronary atherosclerosis of autologous vein by*               KNEE ARTHROSCOPY                                              PROSTATECTOMY                                                 TOTAL SHOULDER REPLACEMENT                                    COLONOSCOPY                                                   POLYPECTOMY                                                   PACEMAKER INSERTION                                           LOBECTOMY                                        01/31/13        Comment:RLL w/squamous cell carcinoma lobectomy   TOTAL KNEE ARTHROPLASTY                         Bilateral              CATARACT EXTRACTION                                           JOINT REPLACEMENT                                             EYE SURGERY                                     Bilateral  Comment:cataract extraction   TOTAL SHOULDER ARTHROPLASTY                     Left 07/10/2015       Comment:Procedure: TOTAL SHOULDER ARTHROPLASTY;                Surgeon: [name], MD;  Location: [facility] ORS;              Service: Orthopedics;  Laterality: Left;   CORONARY ARTERY BYPASS GRAFT                                    Comment:triple   COLONOSCOPY                                                   PACEMAKER INSERTION                              12/2012         Comment:Dual chanber pacemaker generator  BMI    Body Mass Index: 29.90 kg/m      Reproductive/Obstetrics negative OB ROS                             Anesthesia Physical  Anesthesia Plan  ASA: IV  Anesthesia Plan: General   Post-op Pain Management:    Induction: Intravenous  PONV Risk Score and Plan: Propofol infusion and TIVA  Airway Management Planned: Natural Airway and Nasal Cannula  Additional Equipment:   Intra-op Plan:   Post-operative Plan:   Informed Consent: I have reviewed the patients History and  Physical, chart, labs and discussed the procedure including the risks, benefits and alternatives for the proposed anesthesia with the patient or authorized representative who has indicated his/her understanding and acceptance.     Dental Advisory Given  Plan Discussed with: Anesthesiologist, CRNA and Surgeon  Anesthesia Plan Comments: (Patient consented for risks of anesthesia including but not limited to:  - adverse reactions to medications - risk of airway placement if required - damage to eyes, teeth, lips or other oral mucosa - nerve damage due to positioning  - sore throat or hoarseness - Damage to heart, brain, nerves, lungs, other parts of body or loss of life  Patient voiced understanding.)        Anesthesia Quick Evaluation

## 2020-11-28 NOTE — Transfer of Care (Signed)
Immediate Anesthesia Transfer of Care Note  Patient: Rick Mcbride.  Procedure(s) Performed: COLONOSCOPY WITH PROPOFOL (N/A )  Patient Location: PACU  Anesthesia Type:General  Level of Consciousness: sedated  Airway & Oxygen Therapy: Patient Spontanous Breathing and Patient connected to nasal cannula oxygen  Post-op Assessment: Report given to RN and Post -op Vital signs reviewed and stable  Post vital signs: Reviewed and stable  Last Vitals:  Vitals Value Taken Time  BP 148/86 11/28/20 1017  Temp 36.1 C 11/28/20 1017  Pulse 60 11/28/20 1018  Resp 14 11/28/20 1018  SpO2 100 % 11/28/20 1018  Vitals shown include unvalidated device data.  Last Pain:  Vitals:   11/28/20 1017  TempSrc: Temporal  PainSc:          Complications: No complications documented.

## 2020-11-28 NOTE — Anesthesia Postprocedure Evaluation (Signed)
Anesthesia Post Note  Patient: Rick Mcbride.  Procedure(s) Performed: COLONOSCOPY WITH PROPOFOL (N/A )  Patient location during evaluation: Endoscopy Anesthesia Type: General Level of consciousness: awake and alert Pain management: pain level controlled Vital Signs Assessment: post-procedure vital signs reviewed and stable Respiratory status: spontaneous breathing, nonlabored ventilation, respiratory function stable and patient connected to nasal cannula oxygen Cardiovascular status: blood pressure returned to baseline and stable Postop Assessment: no apparent nausea or vomiting Anesthetic complications: no   No complications documented.   Last Vitals:  Vitals:   11/28/20 1027 11/28/20 1037  BP: (!) 170/90 (!) 156/71  Pulse: 62 61  Resp: 15 13  Temp:    SpO2: 100% 100%    Last Pain:  Vitals:   11/28/20 1037  TempSrc:   PainSc: 0-No pain                 Precious Haws Rhilee Currin

## 2020-11-28 NOTE — H&P (Signed)
Rick Mcbride [id number] [birth year]     HPI:  Healthy 80 y/o male with two adenomatous polyps of the transverse colon in 2015. For repeat colonoscopy. Tolerated the prep well, but did not feel it cleaned him out as well as in the past. No formed stool, just tinted liquid with flakes.   Medications Prior to Admission  Medication Sig Dispense Refill Last Dose  . amLODipine (NORVASC) 5 MG tablet Take 1 tablet (5 mg total) by mouth daily. 7 tablet 0 11/28/2020 at 0630  . ascorbic acid (VITAMIN C) 1000 MG tablet Take by mouth.   11/27/2020 at Unknown time  . aspirin EC 81 MG tablet Take 81 mg by mouth daily.    11/28/2020 at 0630  . atorvastatin (LIPITOR) 20 MG tablet Take 40 mg by mouth at bedtime.   11/27/2020 at Unknown time  . Calcium Carb-Cholecalciferol (CALCIUM CARBONATE-VITAMIN D3) 600-400 MG-UNIT TABS Take 1 tablet by mouth daily at 12 noon.    11/27/2020 at Unknown time  . Calcium Carbonate-Vitamin D 600-200 MG-UNIT CAPS Take by mouth.   11/27/2020 at Unknown time  . carisoprodol (SOMA) 350 MG tablet Take 350 mg by mouth every 6 (six) hours as needed for muscle spasms.   Past Week at Unknown time  . cetirizine (ZYRTEC) 10 MG tablet Take 10 mg by mouth daily.   11/27/2020 at Unknown time  . clotrimazole (LOTRIMIN) 1 % cream Apply 1 application topically 2 (two) times daily.   11/27/2020 at Unknown time  . Docusate Sodium (DSS) 100 MG CAPS Take by mouth.   11/27/2020 at Unknown time  . donepezil (ARICEPT) 5 MG tablet Take 5 mg by mouth at bedtime.   11/27/2020 at Unknown time  . econazole nitrate 1 % cream Apply topically 3 (three) times daily.   11/27/2020 at Unknown time  . ferrous sulfate (SLOW FE) 160 (50 FE) MG TBCR SR tablet Take 1 tablet by mouth in the morning and at bedtime.    11/27/2020 at Unknown time  . fluticasone (FLONASE) 50 MCG/ACT nasal spray Place 2 sprays into both nostrils 2 (two) times daily.    11/28/2020 at 0630  . losartan (COZAAR) 100 MG tablet Take 100 mg by mouth  daily.   11/28/2020 at 0630  . Melatonin 10 MG TABS Take 10 mg by mouth at bedtime.    11/27/2020 at Unknown time  . metoprolol succinate (TOPROL-XL) 25 MG 24 hr tablet Take 25 mg by mouth daily.   11/28/2020 at 0630  . Multiple Vitamin (MULTIVITAMIN WITH MINERALS) TABS tablet Take 1 tablet by mouth daily.    11/27/2020 at Unknown time  . Multiple Vitamins-Minerals (PRESERVISION/LUTEIN) CAPS Take 1 capsule by mouth 2 (two) times daily.    11/27/2020 at Unknown time  . pantoprazole (PROTONIX) 40 MG tablet Take 40 mg by mouth daily.   11/27/2020 at Unknown time  . senna (SENOKOT) 8.6 MG tablet Take 1 tablet by mouth daily.   Past Week at Unknown time  . vitamin B-12 (CYANOCOBALAMIN) 1000 MCG tablet Take 1,000 mcg by mouth daily.   11/27/2020 at Unknown time  . zolpidem (AMBIEN) 10 MG tablet Take 10 mg by mouth at bedtime as needed for sleep.    11/27/2020 at Unknown time  . acetaminophen (TYLENOL) 500 MG tablet Take 1,000 mg by mouth 2 (two) times daily.      . Chlorpheniramine Maleate 12 MG TBCR Take 12 mg by mouth daily.     . clopidogrel (PLAVIX) 75 MG tablet  Take 75 mg by mouth daily.    11/20/2020  . ezetimibe (ZETIA) 10 MG tablet Take 10 mg by mouth daily.     Marland Kitchen omeprazole (PRILOSEC) 20 MG capsule Take 20 mg by mouth daily.  (Patient not taking: No sig reported)      No Known Allergies Past Medical History:  Diagnosis Date  . AAA (abdominal aortic aneurysm) ([place])   . AAA (abdominal aortic aneurysm) without rupture ([place])   . Anemia   . Aneurysm ([place])    abd aortic  . Anxiety   . Atrophic kidney   . Cervical radiculopathy   . Chronic airway obstruction (HCC)    not aware of this  . Chronic kidney disease (CKD), stage III (moderate) (HCC)    followed by Dr. Johnny Bridge  . Chronic right shoulder pain 11/12/2019  . Chronic tension headaches   . Coronary artery disease   . Coronary atherosclerosis of autologous vein bypass graft   . DDD (degenerative disc disease), lumbar   . Degenerative disc  disease, lumbar    with lumbar radiculopathy  . Dyspnea   . Elbow fracture, left   . GERD (gastroesophageal reflux disease)   . H/O adenomatous polyp of colon   . H/O hemorrhoids   . H/O urticaria   . Headache   . Heart disease   . Hypercholesteremia   . Hyperlipidemia   . Iliac aneurysm ([place])   . Iliac aneurysm ([place])   . Iliac aneurysm ([place])    followed by Dr. Lucky Cowboy  . Lung cancer ([place])   . Lung cancer ([place])   . Meralgia paresthetica   . Meralgia paresthetica   . Neuralgia   . Osteoarthritis   . Osteoarthritis    s/p L knee surgery  . 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 Presence of permanent cardiac pacemaker   . Prostate cancer ([place])   . Second degree AV block    Followed by Dr. Nehemiah Massed  . Sinoatrial node dysfunction (HCC)   . Status post partial lobectomy of lung    bottom right   . Stroke ([place])   . TIA (transient ischemic attack)    Past Surgical History:  Procedure Laterality Date  . CATARACT EXTRACTION    . COLONOSCOPY    . COLONOSCOPY    . COLONOSCOPY WITH PROPOFOL N/A 10/20/2015   Procedure: COLONOSCOPY WITH PROPOFOL;  Surgeon: Manya Silvas, MD;  [facility] ENDOSCOPY;  Service: Endoscopy;  Laterality: N/A;  . CORONARY ARTERY BYPASS GRAFT     triple  . coronary atherosclerosis of autologous vein bypass graft    . EMBOLIZATION Right 12/27/2016   Procedure: Embolization;  Surgeon: Algernon Huxley, MD;  Location: [place] [facility] LAB;  Service: Cardiovascular;  Laterality: Right;  . ENDOVASCULAR REPAIR/STENT GRAFT N/A 01/05/2017   Procedure: Endovascular Repair/Stent Graft;  Surgeon: Algernon Huxley, MD;  Location: [place] [facility];  Service: Cardiovascular;  Laterality: N/A;  . EYE SURGERY Bilateral    cataract extraction  . INSERT / REPLACE / REMOVE PACEMAKER    . JOINT REPLACEMENT     shoulder and knees  . KNEE ARTHROSCOPY    . LOBECTOMY  01/31/13   RLL w/squamous cell carcinoma lobectomy  . LUNG REMOVAL, PARTIAL  2014    right lower lobe  . PACEMAKER INSERTION    . PACEMAKER INSERTION  12/2012   Dual chanber pacemaker generator  . partial shoulder replacement Right   . POLYPECTOMY    .  PROSTATECTOMY    . TOTAL KNEE ARTHROPLASTY Bilateral   . TOTAL SHOULDER ARTHROPLASTY Left 07/10/2015   Procedure: TOTAL SHOULDER ARTHROPLASTY;  Surgeon: [name], MD;  Location: [facility] ORS;  Service: Orthopedics;  Laterality: Left;  . TOTAL SHOULDER REPLACEMENT     Social History   Socioeconomic History  . Marital status: Married    Spouse name: Not on file  . Number of children: 2  . Years of education: 49  . Highest education level: Not on file  Occupational History  . 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 Smoking status: Former Smoker    Packs/day: 1.50    Years: 45.00    Pack years: 67.50    Quit date: 04/07/2004    Years since quitting: 16.6  . Smokeless tobacco: Never Used  Vaping Use  . Vaping Use: Never used  Substance and Sexual Activity  . Alcohol use: No  . Drug use: No  . Sexual activity: Not on file  Other Topics Concern  . Not on file  Social History Narrative   Patient drinks 4-5 cups of caffeine daily.   Patient is right handed.   Social Determinants of Health   Financial Resource Strain: Not on file  Food Insecurity: Not on file  Transportation Needs: Not on file  Physical Activity: Not on file  Stress: Not on file  Social Connections: Not on file  Intimate Partner Violence: Not on file   Social History   Social History Narrative   Patient drinks 4-5 cups of caffeine daily.   Patient is right handed.     ROS: Negative.     PE: HEENT: Negative. Lungs: Clear. Cardio: RR.  Assessment/Plan:  Proceed with planned endoscopy.   Forest Gleason [facility] 11/28/2020

## 2020-11-28 NOTE — Op Note (Signed)
Preoperative diagnosis: Past history of colonic polyps.  Postoperative diagnosis: Normal colonoscopy.  Operative procedure: Colonoscopy.  Operating surgeon: Hervey Ard, MD.  Anesthesia: Monitored anesthesia care.  Clinical note: This 80 year old male has previously had colonic polyps.  On his last exam in 2016 he had 2 small polyps in the transverse colon and 3 hyperplastic polyps.  He was considered a candidate for repeat exam.  Operative note: Due to malfunction of the probation system a dictated note is required.  A photo of the ileocecal valve and appendiceal orifice is in the media section of [facility].  The endoscope was advanced from the anus to the cecum.  There is a fairly tortuous colon especially in the transverse colon requiring manual pressure for advancement.  Good visualization was noted.  No polyps were identified within the colon.  Impression the patient tolerated the procedure well.  Based on his age and general health additional colonoscopies are not felt indicated.

## 2020-11-28 NOTE — Anesthesia Preprocedure Evaluation (Signed)
Anesthesia Evaluation  Patient identified by MRN, date of birth, ID band Patient awake    Reviewed: Allergy & Precautions, H&P , NPO status , Patient's Chart, lab work & pertinent test results  History of Anesthesia Complications Negative for: history of anesthetic complications  Airway Mallampati: III  TM Distance: >3 FB Neck ROM: full    Dental  (+) Upper Dentures, Lower Dentures   Pulmonary neg shortness of breath, sleep apnea , COPD, former smoker,    Pulmonary exam normal        Cardiovascular Exercise Tolerance: Good hypertension, (-) angina+ CAD, + CABG, + Peripheral Vascular Disease and +CHF (EF 40%)  (-) DOE Normal cardiovascular exam+ dysrhythmias + pacemaker + Valvular Problems/Murmurs MR      Neuro/Psych  Headaches, PSYCHIATRIC DISORDERS Anxiety TIA Neuromuscular disease CVA    GI/Hepatic Neg liver ROS, GERD  Controlled,  Endo/Other  negative endocrine ROS  Renal/GU Renal disease  negative genitourinary   Musculoskeletal  (+) Arthritis ,   Abdominal   Peds  Hematology negative hematology ROS (+)   Anesthesia Other Findings Past Medical History:   Heart disease                                                GERD (gastroesophageal reflux disease)                       Hyperlipidemia                                               Sleep apnea                                                  Hypertension                                                 Neuralgia                                                    Hypercholesteremia                                           Sinoatrial node dysfunction (HCC)                            DDD (degenerative disc disease), lumbar                      Meralgia paresthetica  Osteoarthritis                                               Anxiety                                                      Anemia                                                        Iliac aneurysm (HCC)                                         Iliac aneurysm (HCC)                                         Prostate cancer ([place])                                        Lung cancer ([place])                                            AAA (abdominal aortic aneurysm) ([place])                        Lung cancer ([place])                                            TIA (transient ischemic attack)                              Cervical radiculopathy                                       Stroke [facility])                                                 Coronary artery disease                                      Presence of permanent cardiac pacemaker                      AAA (abdominal aortic aneurysm) without ruptur*  Chronic airway obstruction (HCC)                             Atrophic kidney                                              Chronic kidney disease (CKD), stage III (moder*                Comment:followed by Dr. Johnny Bridge   Headache                                                     Chronic tension headaches                                    Coronary atherosclerosis of autologous vein by*              Degenerative disc disease, lumbar                              Comment:with lumbar radiculopathy   H/O adenomatous polyp of colon                               H/O hemorrhoids                                              H/O urticaria                                                Iliac aneurysm ([place])                                           Comment:followed by Dr. 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 Nehemiah Massed  Past Surgical History:   coronary atherosclerosis of autologous vein by*               KNEE ARTHROSCOPY                                              PROSTATECTOMY                                                 TOTAL SHOULDER REPLACEMENT                                    COLONOSCOPY                                                   POLYPECTOMY                                                   PACEMAKER INSERTION                                           LOBECTOMY                                        01/31/13        Comment:RLL w/squamous cell carcinoma lobectomy   TOTAL KNEE ARTHROPLASTY                         Bilateral              CATARACT EXTRACTION                                           JOINT REPLACEMENT                                             EYE SURGERY                                     Bilateral  Comment:cataract extraction   TOTAL SHOULDER ARTHROPLASTY                     Left 07/10/2015       Comment:Procedure: TOTAL SHOULDER ARTHROPLASTY;                Surgeon: [name], MD;  Location: [facility] ORS;              Service: Orthopedics;  Laterality: Left;   CORONARY ARTERY BYPASS GRAFT                                    Comment:triple   COLONOSCOPY                                                   PACEMAKER INSERTION                              12/2012         Comment:Dual chanber pacemaker generator  BMI    Body Mass Index: 29.90 kg/m      Reproductive/Obstetrics negative OB ROS                             Anesthesia Physical  Anesthesia Plan  ASA: IV  Anesthesia Plan: General   Post-op Pain Management:    Induction: Intravenous  PONV Risk Score and Plan: Propofol infusion and TIVA  Airway Management Planned: Natural Airway and Nasal Cannula  Additional Equipment:   Intra-op Plan:   Post-operative Plan:   Informed Consent: I have reviewed the patients History and  Physical, chart, labs and discussed the procedure including the risks, benefits and alternatives for the proposed anesthesia with the patient or authorized representative who has indicated his/her understanding and acceptance.     Dental Advisory Given  Plan Discussed with: Anesthesiologist, CRNA and Surgeon  Anesthesia Plan Comments: (Patient consented for risks of anesthesia including but not limited to:  - adverse reactions to medications - risk of airway placement if required - damage to eyes, teeth, lips or other oral mucosa - nerve damage due to positioning  - sore throat or hoarseness - Damage to heart, brain, nerves, lungs, other parts of body or loss of life  Patient voiced understanding.)        Anesthesia Quick Evaluation

## 2020-12-01 ENCOUNTER — Encounter: Payer: Self-pay | Admitting: General Surgery

## 2020-12-25 DIAGNOSIS — I441 Atrioventricular block, second degree: Secondary | ICD-10-CM | POA: Diagnosis not present

## 2020-12-25 DIAGNOSIS — E7800 Pure hypercholesterolemia, unspecified: Secondary | ICD-10-CM | POA: Diagnosis not present

## 2020-12-25 DIAGNOSIS — I712 Thoracic aortic aneurysm, without rupture: Secondary | ICD-10-CM | POA: Diagnosis not present

## 2020-12-25 DIAGNOSIS — I714 Abdominal aortic aneurysm, without rupture: Secondary | ICD-10-CM | POA: Diagnosis not present

## 2020-12-25 DIAGNOSIS — I25810 Atherosclerosis of coronary artery bypass graft(s) without angina pectoris: Secondary | ICD-10-CM | POA: Diagnosis not present

## 2020-12-25 DIAGNOSIS — E782 Mixed hyperlipidemia: Secondary | ICD-10-CM | POA: Diagnosis not present

## 2020-12-25 DIAGNOSIS — I495 Sick sinus syndrome: Secondary | ICD-10-CM | POA: Diagnosis not present

## 2020-12-25 DIAGNOSIS — R0989 Other specified symptoms and signs involving the circulatory and respiratory systems: Secondary | ICD-10-CM | POA: Diagnosis not present

## 2020-12-25 DIAGNOSIS — I10 Essential (primary) hypertension: Secondary | ICD-10-CM | POA: Diagnosis not present

## 2020-12-25 DIAGNOSIS — I700 Atherosclerosis of aorta: Secondary | ICD-10-CM | POA: Diagnosis not present

## 2020-12-31 DIAGNOSIS — R7989 Other specified abnormal findings of blood chemistry: Secondary | ICD-10-CM | POA: Diagnosis not present

## 2021-01-06 DIAGNOSIS — N390 Urinary tract infection, site not specified: Secondary | ICD-10-CM | POA: Diagnosis not present

## 2021-01-06 DIAGNOSIS — G629 Polyneuropathy, unspecified: Secondary | ICD-10-CM | POA: Diagnosis not present

## 2021-01-06 DIAGNOSIS — R531 Weakness: Secondary | ICD-10-CM | POA: Diagnosis not present

## 2021-01-10 ENCOUNTER — Other Ambulatory Visit: Payer: Self-pay | Admitting: Anesthesiology

## 2021-01-16 DIAGNOSIS — M7581 Other shoulder lesions, right shoulder: Secondary | ICD-10-CM | POA: Diagnosis not present

## 2021-01-16 DIAGNOSIS — Z96611 Presence of right artificial shoulder joint: Secondary | ICD-10-CM | POA: Diagnosis not present

## 2021-01-16 DIAGNOSIS — M19011 Primary osteoarthritis, right shoulder: Secondary | ICD-10-CM | POA: Diagnosis not present

## 2021-01-28 DIAGNOSIS — M545 Low back pain, unspecified: Secondary | ICD-10-CM | POA: Diagnosis not present

## 2021-01-28 DIAGNOSIS — M25552 Pain in left hip: Secondary | ICD-10-CM | POA: Diagnosis not present

## 2021-01-28 DIAGNOSIS — M5137 Other intervertebral disc degeneration, lumbosacral region: Secondary | ICD-10-CM | POA: Diagnosis not present

## 2021-01-28 DIAGNOSIS — M79605 Pain in left leg: Secondary | ICD-10-CM | POA: Diagnosis not present

## 2021-01-28 DIAGNOSIS — R200 Anesthesia of skin: Secondary | ICD-10-CM | POA: Diagnosis not present

## 2021-01-28 DIAGNOSIS — I708 Atherosclerosis of other arteries: Secondary | ICD-10-CM | POA: Diagnosis not present

## 2021-01-28 DIAGNOSIS — Z96642 Presence of left artificial hip joint: Secondary | ICD-10-CM | POA: Diagnosis not present

## 2021-01-28 DIAGNOSIS — M5440 Lumbago with sciatica, unspecified side: Secondary | ICD-10-CM | POA: Diagnosis not present

## 2021-02-11 DIAGNOSIS — I25810 Atherosclerosis of coronary artery bypass graft(s) without angina pectoris: Secondary | ICD-10-CM | POA: Diagnosis not present

## 2021-02-11 DIAGNOSIS — I6523 Occlusion and stenosis of bilateral carotid arteries: Secondary | ICD-10-CM | POA: Diagnosis not present

## 2021-02-11 DIAGNOSIS — R301 Vesical tenesmus: Secondary | ICD-10-CM | POA: Diagnosis not present

## 2021-02-11 DIAGNOSIS — I2510 Atherosclerotic heart disease of native coronary artery without angina pectoris: Secondary | ICD-10-CM | POA: Diagnosis not present

## 2021-02-11 DIAGNOSIS — I771 Stricture of artery: Secondary | ICD-10-CM | POA: Diagnosis not present

## 2021-02-11 DIAGNOSIS — R350 Frequency of micturition: Secondary | ICD-10-CM | POA: Diagnosis not present

## 2021-02-11 DIAGNOSIS — R0989 Other specified symptoms and signs involving the circulatory and respiratory systems: Secondary | ICD-10-CM | POA: Diagnosis not present

## 2021-02-11 DIAGNOSIS — E782 Mixed hyperlipidemia: Secondary | ICD-10-CM | POA: Diagnosis not present

## 2021-02-12 DIAGNOSIS — I441 Atrioventricular block, second degree: Secondary | ICD-10-CM | POA: Diagnosis not present

## 2021-02-12 DIAGNOSIS — I700 Atherosclerosis of aorta: Secondary | ICD-10-CM | POA: Diagnosis not present

## 2021-02-12 DIAGNOSIS — I714 Abdominal aortic aneurysm, without rupture: Secondary | ICD-10-CM | POA: Diagnosis not present

## 2021-02-12 DIAGNOSIS — I10 Essential (primary) hypertension: Secondary | ICD-10-CM | POA: Diagnosis not present

## 2021-02-12 DIAGNOSIS — R29898 Other symptoms and signs involving the musculoskeletal system: Secondary | ICD-10-CM | POA: Diagnosis not present

## 2021-02-12 DIAGNOSIS — I495 Sick sinus syndrome: Secondary | ICD-10-CM | POA: Diagnosis not present

## 2021-02-12 DIAGNOSIS — R200 Anesthesia of skin: Secondary | ICD-10-CM | POA: Diagnosis not present

## 2021-02-12 DIAGNOSIS — E7800 Pure hypercholesterolemia, unspecified: Secondary | ICD-10-CM | POA: Diagnosis not present

## 2021-02-12 DIAGNOSIS — I712 Thoracic aortic aneurysm, without rupture: Secondary | ICD-10-CM | POA: Diagnosis not present

## 2021-02-12 DIAGNOSIS — I480 Paroxysmal atrial fibrillation: Secondary | ICD-10-CM | POA: Diagnosis not present

## 2021-02-12 DIAGNOSIS — I25810 Atherosclerosis of coronary artery bypass graft(s) without angina pectoris: Secondary | ICD-10-CM | POA: Diagnosis not present

## 2021-02-12 DIAGNOSIS — I499 Cardiac arrhythmia, unspecified: Secondary | ICD-10-CM | POA: Diagnosis not present

## 2021-02-16 ENCOUNTER — Other Ambulatory Visit: Payer: Self-pay | Admitting: Anesthesiology

## 2021-02-18 DIAGNOSIS — H6123 Impacted cerumen, bilateral: Secondary | ICD-10-CM | POA: Diagnosis not present

## 2021-02-18 DIAGNOSIS — H903 Sensorineural hearing loss, bilateral: Secondary | ICD-10-CM | POA: Diagnosis not present

## 2021-02-19 ENCOUNTER — Encounter: Payer: Self-pay | Admitting: Anesthesiology

## 2021-02-19 ENCOUNTER — Other Ambulatory Visit: Payer: Self-pay

## 2021-02-19 ENCOUNTER — Ambulatory Visit: Payer: PPO | Attending: Anesthesiology | Admitting: Anesthesiology

## 2021-02-19 ENCOUNTER — Other Ambulatory Visit: Payer: Self-pay | Admitting: Internal Medicine

## 2021-02-19 DIAGNOSIS — M48062 Spinal stenosis, lumbar region with neurogenic claudication: Secondary | ICD-10-CM | POA: Diagnosis not present

## 2021-02-19 DIAGNOSIS — M5136 Other intervertebral disc degeneration, lumbar region: Secondary | ICD-10-CM | POA: Diagnosis not present

## 2021-02-19 DIAGNOSIS — G8929 Other chronic pain: Secondary | ICD-10-CM

## 2021-02-19 DIAGNOSIS — M961 Postlaminectomy syndrome, not elsewhere classified: Secondary | ICD-10-CM | POA: Diagnosis not present

## 2021-02-19 DIAGNOSIS — R401 Stupor: Secondary | ICD-10-CM

## 2021-02-19 DIAGNOSIS — E782 Mixed hyperlipidemia: Secondary | ICD-10-CM | POA: Diagnosis not present

## 2021-02-19 DIAGNOSIS — M5432 Sciatica, left side: Secondary | ICD-10-CM | POA: Diagnosis not present

## 2021-02-19 DIAGNOSIS — R29898 Other symptoms and signs involving the musculoskeletal system: Secondary | ICD-10-CM | POA: Diagnosis not present

## 2021-02-19 DIAGNOSIS — I700 Atherosclerosis of aorta: Secondary | ICD-10-CM | POA: Diagnosis not present

## 2021-02-19 DIAGNOSIS — M25562 Pain in left knee: Secondary | ICD-10-CM

## 2021-02-19 DIAGNOSIS — M25511 Pain in right shoulder: Secondary | ICD-10-CM

## 2021-02-19 DIAGNOSIS — I129 Hypertensive chronic kidney disease with stage 1 through stage 4 chronic kidney disease, or unspecified chronic kidney disease: Secondary | ICD-10-CM | POA: Diagnosis not present

## 2021-02-19 DIAGNOSIS — R270 Ataxia, unspecified: Secondary | ICD-10-CM | POA: Diagnosis not present

## 2021-02-19 DIAGNOSIS — R739 Hyperglycemia, unspecified: Secondary | ICD-10-CM | POA: Diagnosis not present

## 2021-02-19 DIAGNOSIS — J431 Panlobular emphysema: Secondary | ICD-10-CM | POA: Diagnosis not present

## 2021-02-19 DIAGNOSIS — R8290 Unspecified abnormal findings in urine: Secondary | ICD-10-CM | POA: Diagnosis not present

## 2021-02-19 DIAGNOSIS — R200 Anesthesia of skin: Secondary | ICD-10-CM | POA: Diagnosis not present

## 2021-02-19 DIAGNOSIS — R531 Weakness: Secondary | ICD-10-CM | POA: Diagnosis not present

## 2021-02-19 DIAGNOSIS — I10 Essential (primary) hypertension: Secondary | ICD-10-CM | POA: Diagnosis not present

## 2021-02-19 DIAGNOSIS — Z0000 Encounter for general adult medical examination without abnormal findings: Secondary | ICD-10-CM | POA: Diagnosis not present

## 2021-02-19 DIAGNOSIS — M51369 Other intervertebral disc degeneration, lumbar region without mention of lumbar back pain or lower extremity pain: Secondary | ICD-10-CM

## 2021-02-19 HISTORY — DX: Anesthesia of skin: R20.0

## 2021-02-19 MED ORDER — TRAMADOL HCL 50 MG PO TABS: 50.0000 mg | ORAL_TABLET | Freq: Four times a day (QID) | ORAL | 2 refills | Status: AC | PRN

## 2021-02-19 NOTE — Progress Notes (Signed)
Virtual Visit via Telephone Note  I connected with Rick Mcbride. on 02/19/21 at  8:40 AM EDT by telephone and verified that I am speaking with the correct person using two identifiers.  Location: Patient: Home Provider: Pain control center   I discussed the limitations, risks, security and privacy concerns of performing an evaluation and management service by telephone and the availability of in person appointments. I also discussed with the patient that there may be a patient responsible charge related to this service. The patient expressed understanding and agreed to proceed.   History of Present Illness: I spoke with Rick Mcbride via telephone as he was unable to link for the video portion of the virtual conference.  He states that he is continuing to have chronic low back pain similar to what he has experienced in the past.  It is significantly debilitating unfortunately for him.  He has managed to function with tramadol taking one half of a 50 mg tablet approximately 8 times a day and this enables him to stay active and functional.  With the current combination he is able to do his daily tasks though he is still limited by overall pain.  He rates the pain control at approximately 25 to 50%.  Unfortunately more conservative therapy has been ineffective for him.  He is trying to do his stretching strengthening exercises as tolerated and to stay functionally active.  No change in lower extremity strength is recently noted that he does have problems with chronic lower extremity weakness and states that his back surgery did not significantly change this for him.  Otherwise he is in his usual state of health at this time.   Observations/Objective:  Current Outpatient Medications:  .  traMADol (ULTRAM) 50 MG tablet, Take 1 tablet (50 mg total) by mouth every 6 (six) hours as needed., Disp: 120 tablet, Rfl: 2 .  acetaminophen (TYLENOL) 500 MG tablet, Take 1,000 mg by mouth 2 (two) times daily. ,  Disp: , Rfl:  .  amLODipine (NORVASC) 5 MG tablet, Take 1 tablet (5 mg total) by mouth daily., Disp: 7 tablet, Rfl: 0 .  ascorbic acid (VITAMIN C) 1000 MG tablet, Take by mouth., Disp: , Rfl:  .  aspirin EC 81 MG tablet, Take 81 mg by mouth daily. , Disp: , Rfl:  .  atorvastatin (LIPITOR) 20 MG tablet, Take 40 mg by mouth at bedtime., Disp: , Rfl:  .  Calcium Carb-Cholecalciferol (CALCIUM CARBONATE-VITAMIN D3) 600-400 MG-UNIT TABS, Take 1 tablet by mouth daily at 12 noon. , Disp: , Rfl:  .  Calcium Carbonate-Vitamin D 600-200 MG-UNIT CAPS, Take by mouth., Disp: , Rfl:  .  carisoprodol (SOMA) 350 MG tablet, Take 350 mg by mouth every 6 (six) hours as needed for muscle spasms., Disp: , Rfl:  .  cetirizine (ZYRTEC) 10 MG tablet, Take 10 mg by mouth daily., Disp: , Rfl:  .  Chlorpheniramine Maleate 12 MG TBCR, Take 12 mg by mouth daily., Disp: , Rfl:  .  clopidogrel (PLAVIX) 75 MG tablet, Take 75 mg by mouth daily. , Disp: , Rfl:  .  clotrimazole (LOTRIMIN) 1 % cream, Apply 1 application topically 2 (two) times daily., Disp: , Rfl:  .  Docusate Sodium (DSS) 100 MG CAPS, Take by mouth., Disp: , Rfl:  .  donepezil (ARICEPT) 5 MG tablet, Take 5 mg by mouth at bedtime., Disp: , Rfl:  .  econazole nitrate 1 % cream, Apply topically 3 (three) times daily., Disp: ,  Rfl:  .  ezetimibe (ZETIA) 10 MG tablet, Take 10 mg by mouth daily., Disp: , Rfl:  .  ferrous sulfate (SLOW FE) 160 (50 FE) MG TBCR SR tablet, Take 1 tablet by mouth in the morning and at bedtime. , Disp: , Rfl:  .  fluticasone (FLONASE) 50 MCG/ACT nasal spray, Place 2 sprays into both nostrils 2 (two) times daily. , Disp: , Rfl:  .  losartan (COZAAR) 100 MG tablet, Take 100 mg by mouth daily., Disp: , Rfl:  .  Melatonin 10 MG TABS, Take 10 mg by mouth at bedtime. , Disp: , Rfl:  .  metoprolol succinate (TOPROL-XL) 25 MG 24 hr tablet, Take 25 mg by mouth daily., Disp: , Rfl:  .  Multiple Vitamin (MULTIVITAMIN WITH MINERALS) TABS tablet, Take 1  tablet by mouth daily. , Disp: , Rfl:  .  Multiple Vitamins-Minerals (PRESERVISION/LUTEIN) CAPS, Take 1 capsule by mouth 2 (two) times daily. , Disp: , Rfl:  .  omeprazole (PRILOSEC) 20 MG capsule, Take 20 mg by mouth daily.  (Patient not taking: No sig reported), Disp: , Rfl:  .  pantoprazole (PROTONIX) 40 MG tablet, Take 40 mg by mouth daily., Disp: , Rfl:  .  senna (SENOKOT) 8.6 MG tablet, Take 1 tablet by mouth daily., Disp: , Rfl:  .  vitamin B-12 (CYANOCOBALAMIN) 1000 MCG tablet, Take 1,000 mcg by mouth daily., Disp: , Rfl:  .  zolpidem (AMBIEN) 10 MG tablet, Take 10 mg by mouth at bedtime as needed for sleep. , Disp: , Rfl:   Assessment and Plan:  1. DDD (degenerative disc disease), lumbar   2. Failed back surgical syndrome   3. Sciatica of left side   4. Weakness of both legs   5. Spinal stenosis, lumbar region, with neurogenic claudication   6. Arthralgia of left lower leg   7. Chronic right shoulder pain   8. Numbness of right foot   Based on her discussion today and upon review of the [facility] practitioner database information I am going to refill his medications for the next 3 months.  I feel it is appropriate that he continue on his current dosing regimen.  This is for a total of 50 mg 4 times a day or as he sometimes utilizes 25 mg up to 8 times a day every 2-3 hours.  This regimen seems to work for him with no side effects reported.  I encouraged him to stay active with stretching strengthening exercises.  We talked about different options regarding possible dorsal column stimulator trial if the existing regimen is ineffective should there be further disease progression.  He is to continue follow-up with his primary care physicians for his baseline medical care with return to clinic in 3 months. Follow Up Instructions:    I discussed the assessment and treatment plan with the patient. The patient was provided an opportunity to ask questions and all were answered. The  patient agreed with the plan and demonstrated an understanding of the instructions.   The patient was advised to call back or seek an in-person evaluation if the symptoms worsen or if the condition fails to improve as anticipated.  I provided 30 minutes of non-face-to-face time during this encounter.   Molli Barrows, MD

## 2021-03-02 DIAGNOSIS — I441 Atrioventricular block, second degree: Secondary | ICD-10-CM | POA: Diagnosis not present

## 2021-03-02 DIAGNOSIS — I499 Cardiac arrhythmia, unspecified: Secondary | ICD-10-CM | POA: Diagnosis not present

## 2021-03-04 ENCOUNTER — Ambulatory Visit
Admission: RE | Admit: 2021-03-04 | Discharge: 2021-03-04 | Disposition: A | Discharge status: Home / Self Care | Payer: PPO | Source: Ambulatory Visit | Attending: Internal Medicine | Admitting: Internal Medicine

## 2021-03-04 ENCOUNTER — Other Ambulatory Visit: Payer: Self-pay

## 2021-03-04 DIAGNOSIS — R401 Stupor: Secondary | ICD-10-CM | POA: Insufficient documentation

## 2021-03-04 DIAGNOSIS — I129 Hypertensive chronic kidney disease with stage 1 through stage 4 chronic kidney disease, or unspecified chronic kidney disease: Secondary | ICD-10-CM | POA: Diagnosis not present

## 2021-03-04 DIAGNOSIS — R270 Ataxia, unspecified: Secondary | ICD-10-CM | POA: Diagnosis not present

## 2021-03-04 DIAGNOSIS — N2581 Secondary hyperparathyroidism of renal origin: Secondary | ICD-10-CM | POA: Diagnosis not present

## 2021-03-04 DIAGNOSIS — D631 Anemia in chronic kidney disease: Secondary | ICD-10-CM | POA: Diagnosis not present

## 2021-03-04 DIAGNOSIS — N184 Chronic kidney disease, stage 4 (severe): Secondary | ICD-10-CM | POA: Diagnosis not present

## 2021-03-04 DIAGNOSIS — G9389 Other specified disorders of brain: Secondary | ICD-10-CM | POA: Diagnosis not present

## 2021-03-04 DIAGNOSIS — R311 Benign essential microscopic hematuria: Secondary | ICD-10-CM | POA: Diagnosis not present

## 2021-03-04 DIAGNOSIS — I6782 Cerebral ischemia: Secondary | ICD-10-CM | POA: Diagnosis not present

## 2021-03-04 DIAGNOSIS — R809 Proteinuria, unspecified: Secondary | ICD-10-CM | POA: Diagnosis not present

## 2021-03-04 DIAGNOSIS — G319 Degenerative disease of nervous system, unspecified: Secondary | ICD-10-CM | POA: Diagnosis not present

## 2021-03-05 DIAGNOSIS — H353132 Nonexudative age-related macular degeneration, bilateral, intermediate dry stage: Secondary | ICD-10-CM | POA: Diagnosis not present

## 2021-03-05 DIAGNOSIS — R519 Headache, unspecified: Secondary | ICD-10-CM | POA: Diagnosis not present

## 2021-03-08 NOTE — Progress Notes (Signed)
03/10/2021  4:41 PM   Macintyre T Aarons Jr. [birth year] [id number]  Referring provider: Idelle Crouch, MD [place] [facility] [place],  [place] [address] Chief Complaint  Patient presents with  . Recurrent UTI   Chief complaint: Recurrent UTI   HPI: Rick Mcbride. is a 80 y.o. male referred for recurrent UTI.   He presents today with his wife who provided the majority of the history  She states he has been treated for 3 UTIs in the past few months though denies that he has any symptoms including frequency, urgency or dysuria  He has chronic urinary incontinence after radical prostatectomy ~ 10 years ago  Denies gross hematuria  Denies flank, abdominal or pelvic pain  Urine culture positive for E. coli January 2022, March 2022 and April 2022  PSA January 2022 less than 0.01  Main complaint is leg weakness due to prior back surgeries and degenerative disc disease  PMH: Past Medical History:  Diagnosis Date  . AAA (abdominal aortic aneurysm) ([place])   . AAA (abdominal aortic aneurysm) without rupture ([place])   . Anemia   . Aneurysm ([place])    abd aortic  . Anxiety   . Atrophic kidney   . Cervical radiculopathy   . Chronic airway obstruction (HCC)    not aware of this  . Chronic kidney disease (CKD), stage III (moderate) (HCC)    followed by Dr. Johnny Bridge  . Chronic right shoulder pain 11/12/2019  . Chronic tension headaches   . Coronary artery disease   . Coronary atherosclerosis of autologous vein bypass graft   . DDD (degenerative disc disease), lumbar   . Degenerative disc disease, lumbar    with lumbar radiculopathy  . Dyspnea   . Elbow fracture, left   . GERD (gastroesophageal reflux disease)   . H/O adenomatous polyp of colon   . H/O hemorrhoids   . H/O urticaria   . Headache   . Heart disease   . Hypercholesteremia   . Hyperlipidemia   . 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  Lucky Cowboy  . Lung cancer ([place])   . Lung cancer ([place])   . Meralgia paresthetica   . Meralgia paresthetica   . Neuralgia   . Numbness of right foot 02/19/2021  . Osteoarthritis   . Osteoarthritis    s/p L knee surgery  . 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 Presence of permanent cardiac pacemaker   . Prostate cancer ([place])   . Second degree AV block    Followed by Dr. Nehemiah Massed  . Sinoatrial node dysfunction (HCC)   . Status post partial lobectomy of lung    bottom right   . Stroke ([place])   . TIA (transient ischemic attack)     Surgical History: Past Surgical History:  Procedure Laterality Date  . CATARACT EXTRACTION    . COLONOSCOPY    . COLONOSCOPY    . COLONOSCOPY WITH PROPOFOL N/A 10/20/2015   Procedure: COLONOSCOPY WITH PROPOFOL;  Surgeon: Manya Silvas, MD;  Location: [facility] ENDOSCOPY;  Service: Endoscopy;  Laterality: N/A;  . COLONOSCOPY WITH PROPOFOL N/A 11/28/2020   Procedure: COLONOSCOPY WITH PROPOFOL;  Surgeon: Robert Bellow, MD;  Location: [facility] ENDOSCOPY;  Service: Endoscopy;  Laterality: N/A;  . CORONARY ARTERY BYPASS GRAFT     triple  . coronary atherosclerosis of autologous vein bypass graft    .  EMBOLIZATION Right 12/27/2016   Procedure: Embolization;  Surgeon: Algernon Huxley, MD;  Location: [place];  Service: Cardiovascular;  Laterality: Right;  . ENDOVASCULAR REPAIR/STENT GRAFT N/A 01/05/2017   Procedure: Endovascular Repair/Stent Graft;  Surgeon: Algernon Huxley, MD;  Location: [place];  Service: Cardiovascular;  Laterality: N/A;  . EYE SURGERY Bilateral    cataract extraction  . INSERT / REPLACE / REMOVE PACEMAKER    . JOINT REPLACEMENT     shoulder and knees  . KNEE ARTHROSCOPY    . LOBECTOMY  01/31/13   RLL w/squamous cell carcinoma lobectomy  . LUNG REMOVAL, PARTIAL  2014   right lower lobe  . PACEMAKER INSERTION    . PACEMAKER INSERTION  12/2012   Dual chanber pacemaker generator  . partial shoulder  replacement Right   . POLYPECTOMY    . PROSTATECTOMY    . TOTAL KNEE ARTHROPLASTY Bilateral   . TOTAL SHOULDER ARTHROPLASTY Left 07/10/2015   Procedure: TOTAL SHOULDER ARTHROPLASTY;  Surgeon: [name], MD;  Location: [facility] ORS;  Service: Orthopedics;  Laterality: Left;  . TOTAL SHOULDER REPLACEMENT      Home Medications:  Allergies as of 03/09/2021   No Known Allergies     Medication List       Accurate as of Mar 09, 2021 11:59 PM. If you have any questions, ask your nurse or doctor.        acetaminophen 500 MG tablet Commonly known as: TYLENOL Take 1,000 mg by mouth 2 (two) times daily.   amLODipine 5 MG tablet Commonly known as: NORVASC Take 1 tablet (5 mg total) by mouth daily.   ascorbic acid 1000 MG tablet Commonly known as: VITAMIN C Take by mouth.   aspirin EC 81 MG tablet Take 81 mg by mouth daily.   atorvastatin 20 MG tablet Commonly known as: LIPITOR Take 40 mg by mouth at bedtime.   Calcium Carbonate-Vitamin D 600-200 MG-UNIT Caps Take by mouth.   Calcium Carbonate-Vitamin D3 600-400 MG-UNIT Tabs Take 1 tablet by mouth daily at 12 noon.   carisoprodol 350 MG tablet Commonly known as: SOMA Take 350 mg by mouth every 6 (six) hours as needed for muscle spasms.   cetirizine 10 MG tablet Commonly known as: ZYRTEC Take 10 mg by mouth daily.   Chlorpheniramine Maleate 12 MG Tbcr Take 12 mg by mouth daily.   clopidogrel 75 MG tablet Commonly known as: PLAVIX Take 75 mg by mouth daily.   clotrimazole 1 % cream Commonly known as: LOTRIMIN Apply 1 application topically 2 (two) times daily.   donepezil 5 MG tablet Commonly known as: ARICEPT Take 5 mg by mouth at bedtime.   DSS 100 MG Caps Take by mouth.   econazole nitrate 1 % cream Apply topically 3 (three) times daily.   ezetimibe 10 MG tablet Commonly known as: ZETIA Take 10 mg by mouth daily.   ferrous sulfate 160 (50 Fe) MG Tbcr SR tablet Commonly known as: SLOW FE Take 1 tablet by  mouth in the morning and at bedtime.   fluticasone 50 MCG/ACT nasal spray Commonly known as: FLONASE Place 2 sprays into both nostrils 2 (two) times daily.   losartan 100 MG tablet Commonly known as: COZAAR Take 100 mg by mouth daily.   Melatonin 10 MG Tabs Take 10 mg by mouth at bedtime.   metoprolol succinate 25 MG 24 hr tablet Commonly known as: TOPROL-XL Take 25 mg by mouth daily.   multivitamin with  minerals Tabs tablet Take 1 tablet by mouth daily.   omeprazole 20 MG capsule Commonly known as: PRILOSEC Take 20 mg by mouth daily.   pantoprazole 40 MG tablet Commonly known as: PROTONIX Take 40 mg by mouth daily.   PreserVision/Lutein Caps Take 1 capsule by mouth 2 (two) times daily.   senna 8.6 MG tablet Commonly known as: SENOKOT Take 1 tablet by mouth daily.   traMADol 50 MG tablet Commonly known as: Ultram Take 1 tablet (50 mg total) by mouth every 6 (six) hours as needed.   vitamin B-12 1000 MCG tablet Commonly known as: CYANOCOBALAMIN Take 1,000 mcg by mouth daily.   zolpidem 10 MG tablet Commonly known as: AMBIEN Take 10 mg by mouth at bedtime as needed for sleep.       Allergies: No Known Allergies  Family History: Family History  Problem Relation Age of Onset  . Heart attack Mother   . Heart attack Father   . Breast cancer Sister   . Asthma Sister     Social History:   reports that he quit smoking about 16 years ago. He has a 67.50 pack-year smoking history. He has never used smokeless tobacco. He reports that he does not drink alcohol and does not use drugs.  ROS: Pertinent ROS in HPI.  Physical Exam: BP (!) 180/90   Pulse 71   Ht 6' (1.829 m)   Wt 218 lb (98.9 kg)   BMI 29.57 kg/m   Constitutional:  Alert and oriented, No acute distress. HEENT: [place] AT, moist mucus membranes.  Trachea midline, no masses. Cardiovascular: No clubbing, cyanosis, or edema. Respiratory: Normal respiratory effort, no increased work of breathing. GI:  Abdomen is soft, non tender, non distended, no abdominal masses GU: Phallus without lesions, testes descended bilaterally without masses or tenderness, prostate surgically absent Skin: No rashes, bruises or suspicious lesions.  Assessment & Plan:    1.  Asymptomatic bacteriuria  Bladder scan PVR 0 mL  Recommend scheduling CT abdomen pelvis to rule out any anatomic abnormalities including urinary tract calculi  He is emptying completely which would not be a source of bacteriuria  Will call with CT results   [place] [address] [place],  [address] [phone]

## 2021-03-09 ENCOUNTER — Ambulatory Visit: Payer: PPO | Admitting: Urology

## 2021-03-09 ENCOUNTER — Other Ambulatory Visit: Payer: Self-pay

## 2021-03-09 ENCOUNTER — Encounter: Payer: Self-pay | Admitting: Urology

## 2021-03-09 VITALS — BP 180/90 | HR 71 | Ht 72.0 in | Wt 218.0 lb

## 2021-03-09 DIAGNOSIS — Z8546 Personal history of malignant neoplasm of prostate: Secondary | ICD-10-CM | POA: Diagnosis not present

## 2021-03-09 DIAGNOSIS — N390 Urinary tract infection, site not specified: Secondary | ICD-10-CM

## 2021-03-09 DIAGNOSIS — R8271 Bacteriuria: Secondary | ICD-10-CM

## 2021-03-09 LAB — BLADDER SCAN AMB NON-IMAGING: Scan Result: 0

## 2021-03-10 ENCOUNTER — Encounter: Payer: Self-pay | Admitting: Urology

## 2021-03-10 DIAGNOSIS — N2581 Secondary hyperparathyroidism of renal origin: Secondary | ICD-10-CM | POA: Diagnosis not present

## 2021-03-10 DIAGNOSIS — I129 Hypertensive chronic kidney disease with stage 1 through stage 4 chronic kidney disease, or unspecified chronic kidney disease: Secondary | ICD-10-CM | POA: Diagnosis not present

## 2021-03-10 DIAGNOSIS — R319 Hematuria, unspecified: Secondary | ICD-10-CM | POA: Diagnosis not present

## 2021-03-10 DIAGNOSIS — R809 Proteinuria, unspecified: Secondary | ICD-10-CM | POA: Diagnosis not present

## 2021-03-10 DIAGNOSIS — D631 Anemia in chronic kidney disease: Secondary | ICD-10-CM | POA: Diagnosis not present

## 2021-03-10 DIAGNOSIS — N184 Chronic kidney disease, stage 4 (severe): Secondary | ICD-10-CM | POA: Diagnosis not present

## 2021-03-10 LAB — MICROSCOPIC EXAMINATION: WBC, UA: >30 /hpf — AB (ref 0–5)

## 2021-03-10 LAB — URINALYSIS, COMPLETE
Bilirubin, UA: NEGATIVE
Glucose, UA: NEGATIVE
Ketones, UA: NEGATIVE
Nitrite, UA: NEGATIVE
RBC, UA: NEGATIVE
Specific Gravity, UA: 1.02 (ref 1.005–1.030)
Urobilinogen, Ur: 0.2 mg/dL (ref 0.2–1.0)
pH, UA: 6.5 (ref 5.0–7.5)

## 2021-03-12 ENCOUNTER — Telehealth: Payer: Self-pay | Admitting: Urology

## 2021-03-12 NOTE — Telephone Encounter (Signed)
[facility] called today requesting that Dr. Bernardo Heater review this patient's most recent UA & culture.  Dr. Juleen China is requesting a second look and treatment determination if needed.  Results are in care everywhere ([facility]).  Please advise.

## 2021-03-12 NOTE — Telephone Encounter (Signed)
Left message a [facility] and advised patient wife.

## 2021-03-12 NOTE — Telephone Encounter (Signed)
The patient has chronic bacteriuria and has been treated with multiple antibiotic courses with persistence.  I saw on 5/2 and he was having no UTI symptoms.  His weakness was leg weakness from previous back surgery.  Her urine culture here is growing gram-negative rods.  I would not recommend treatment unless he develops definite UTI symptoms.  He has chronic urinary incontinence from prior radical prostatectomy.

## 2021-03-12 NOTE — Telephone Encounter (Signed)
Talked with [place] and with patients wife  As instructed.

## 2021-03-15 LAB — CULTURE, URINE COMPREHENSIVE

## 2021-03-16 ENCOUNTER — Telehealth: Payer: Self-pay | Admitting: *Deleted

## 2021-03-16 NOTE — Telephone Encounter (Signed)
-----   Message from Nori Riis, PA-C sent at 03/16/2021 10:16 AM EDT ----- Please let [name] know that his urine culture was positive for infection, but per Dr.Stoioff's recommendations we are not to treat the infection unless he is having symptoms.

## 2021-03-16 NOTE — Telephone Encounter (Signed)
Notified patient wife  as instructed

## 2021-03-31 ENCOUNTER — Other Ambulatory Visit: Payer: Self-pay

## 2021-03-31 ENCOUNTER — Ambulatory Visit
Admission: RE | Admit: 2021-03-31 | Discharge: 2021-03-31 | Disposition: A | Discharge status: Home / Self Care | Payer: PPO | Source: Ambulatory Visit | Attending: Urology | Admitting: Urology

## 2021-03-31 DIAGNOSIS — R8271 Bacteriuria: Secondary | ICD-10-CM | POA: Diagnosis not present

## 2021-03-31 DIAGNOSIS — N200 Calculus of kidney: Secondary | ICD-10-CM | POA: Insufficient documentation

## 2021-03-31 DIAGNOSIS — Z85118 Personal history of other malignant neoplasm of bronchus and lung: Secondary | ICD-10-CM | POA: Diagnosis not present

## 2021-03-31 DIAGNOSIS — Z01818 Encounter for other preprocedural examination: Secondary | ICD-10-CM | POA: Diagnosis not present

## 2021-03-31 DIAGNOSIS — R350 Frequency of micturition: Secondary | ICD-10-CM | POA: Diagnosis not present

## 2021-04-02 ENCOUNTER — Telehealth: Payer: Self-pay | Admitting: Urology

## 2021-04-02 DIAGNOSIS — L738 Other specified follicular disorders: Secondary | ICD-10-CM | POA: Diagnosis not present

## 2021-04-02 DIAGNOSIS — D2261 Melanocytic nevi of right upper limb, including shoulder: Secondary | ICD-10-CM | POA: Diagnosis not present

## 2021-04-02 DIAGNOSIS — D2262 Melanocytic nevi of left upper limb, including shoulder: Secondary | ICD-10-CM | POA: Diagnosis not present

## 2021-04-02 DIAGNOSIS — N200 Calculus of kidney: Secondary | ICD-10-CM

## 2021-04-02 DIAGNOSIS — X32XXXA Exposure to sunlight, initial encounter: Secondary | ICD-10-CM | POA: Diagnosis not present

## 2021-04-02 DIAGNOSIS — L570 Actinic keratosis: Secondary | ICD-10-CM | POA: Diagnosis not present

## 2021-04-02 DIAGNOSIS — D225 Melanocytic nevi of trunk: Secondary | ICD-10-CM | POA: Diagnosis not present

## 2021-04-02 DIAGNOSIS — D2271 Melanocytic nevi of right lower limb, including hip: Secondary | ICD-10-CM | POA: Diagnosis not present

## 2021-04-02 DIAGNOSIS — L821 Other seborrheic keratosis: Secondary | ICD-10-CM | POA: Diagnosis not present

## 2021-04-02 DIAGNOSIS — D2272 Melanocytic nevi of left lower limb, including hip: Secondary | ICD-10-CM | POA: Diagnosis not present

## 2021-04-02 NOTE — Telephone Encounter (Signed)
CT scan shows a stone in the left kidney which is not causing obstruction.  This is an unlikely source of the bacteria in his urine.  The options include periodic x-rays to make sure the stone is not growing or treatment with ureteroscopy.  If he desires observation would recommend a follow-up office visit with KUB in 6 months.

## 2021-04-02 NOTE — Telephone Encounter (Signed)
Notified patient as instructed, patient would like to due the KUB in 6 months.

## 2021-04-13 DIAGNOSIS — R29898 Other symptoms and signs involving the musculoskeletal system: Secondary | ICD-10-CM | POA: Diagnosis not present

## 2021-04-13 DIAGNOSIS — R200 Anesthesia of skin: Secondary | ICD-10-CM | POA: Diagnosis not present

## 2021-04-20 DIAGNOSIS — M19011 Primary osteoarthritis, right shoulder: Secondary | ICD-10-CM | POA: Diagnosis not present

## 2021-04-20 DIAGNOSIS — M7581 Other shoulder lesions, right shoulder: Secondary | ICD-10-CM | POA: Diagnosis not present

## 2021-04-20 DIAGNOSIS — Z96611 Presence of right artificial shoulder joint: Secondary | ICD-10-CM | POA: Diagnosis not present

## 2021-04-27 DIAGNOSIS — I441 Atrioventricular block, second degree: Secondary | ICD-10-CM | POA: Diagnosis not present

## 2021-04-27 DIAGNOSIS — I714 Abdominal aortic aneurysm, without rupture: Secondary | ICD-10-CM | POA: Diagnosis not present

## 2021-04-27 DIAGNOSIS — I700 Atherosclerosis of aorta: Secondary | ICD-10-CM | POA: Diagnosis not present

## 2021-04-27 DIAGNOSIS — I10 Essential (primary) hypertension: Secondary | ICD-10-CM | POA: Diagnosis not present

## 2021-04-27 DIAGNOSIS — I25810 Atherosclerosis of coronary artery bypass graft(s) without angina pectoris: Secondary | ICD-10-CM | POA: Diagnosis not present

## 2021-04-27 DIAGNOSIS — I6523 Occlusion and stenosis of bilateral carotid arteries: Secondary | ICD-10-CM | POA: Diagnosis not present

## 2021-05-04 ENCOUNTER — Other Ambulatory Visit: Payer: Self-pay | Admitting: Anesthesiology

## 2021-05-14 DIAGNOSIS — R208 Other disturbances of skin sensation: Secondary | ICD-10-CM | POA: Diagnosis not present

## 2021-05-14 DIAGNOSIS — R2689 Other abnormalities of gait and mobility: Secondary | ICD-10-CM | POA: Diagnosis not present

## 2021-05-14 DIAGNOSIS — E559 Vitamin D deficiency, unspecified: Secondary | ICD-10-CM | POA: Diagnosis not present

## 2021-05-14 DIAGNOSIS — Z79899 Other long term (current) drug therapy: Secondary | ICD-10-CM | POA: Diagnosis not present

## 2021-05-14 DIAGNOSIS — Z8673 Personal history of transient ischemic attack (TIA), and cerebral infarction without residual deficits: Secondary | ICD-10-CM | POA: Diagnosis not present

## 2021-05-14 DIAGNOSIS — R0683 Snoring: Secondary | ICD-10-CM | POA: Diagnosis not present

## 2021-05-14 DIAGNOSIS — G3184 Mild cognitive impairment, so stated: Secondary | ICD-10-CM | POA: Diagnosis not present

## 2021-05-14 DIAGNOSIS — G629 Polyneuropathy, unspecified: Secondary | ICD-10-CM | POA: Diagnosis not present

## 2021-05-19 DIAGNOSIS — I495 Sick sinus syndrome: Secondary | ICD-10-CM | POA: Diagnosis not present

## 2021-06-02 DIAGNOSIS — J039 Acute tonsillitis, unspecified: Secondary | ICD-10-CM | POA: Diagnosis not present

## 2021-06-02 DIAGNOSIS — R042 Hemoptysis: Secondary | ICD-10-CM | POA: Diagnosis not present

## 2021-06-08 ENCOUNTER — Other Ambulatory Visit: Payer: Self-pay | Admitting: Anesthesiology

## 2021-06-15 DIAGNOSIS — R208 Other disturbances of skin sensation: Secondary | ICD-10-CM | POA: Diagnosis not present

## 2021-06-16 ENCOUNTER — Other Ambulatory Visit: Payer: Self-pay

## 2021-06-16 ENCOUNTER — Ambulatory Visit: Payer: PPO | Attending: Anesthesiology | Admitting: Anesthesiology

## 2021-06-16 ENCOUNTER — Encounter: Payer: Self-pay | Admitting: Anesthesiology

## 2021-06-16 DIAGNOSIS — M961 Postlaminectomy syndrome, not elsewhere classified: Secondary | ICD-10-CM

## 2021-06-16 DIAGNOSIS — G8929 Other chronic pain: Secondary | ICD-10-CM | POA: Diagnosis not present

## 2021-06-16 DIAGNOSIS — F1190 Opioid use, unspecified, uncomplicated: Secondary | ICD-10-CM | POA: Diagnosis not present

## 2021-06-16 DIAGNOSIS — M5136 Other intervertebral disc degeneration, lumbar region: Secondary | ICD-10-CM

## 2021-06-16 DIAGNOSIS — R29898 Other symptoms and signs involving the musculoskeletal system: Secondary | ICD-10-CM | POA: Diagnosis not present

## 2021-06-16 DIAGNOSIS — M25511 Pain in right shoulder: Secondary | ICD-10-CM | POA: Diagnosis not present

## 2021-06-16 DIAGNOSIS — G894 Chronic pain syndrome: Secondary | ICD-10-CM

## 2021-06-16 DIAGNOSIS — M48062 Spinal stenosis, lumbar region with neurogenic claudication: Secondary | ICD-10-CM

## 2021-06-16 DIAGNOSIS — R200 Anesthesia of skin: Secondary | ICD-10-CM

## 2021-06-16 DIAGNOSIS — M5432 Sciatica, left side: Secondary | ICD-10-CM | POA: Diagnosis not present

## 2021-06-16 DIAGNOSIS — M51369 Other intervertebral disc degeneration, lumbar region without mention of lumbar back pain or lower extremity pain: Secondary | ICD-10-CM

## 2021-06-16 DIAGNOSIS — M25562 Pain in left knee: Secondary | ICD-10-CM | POA: Diagnosis not present

## 2021-06-16 HISTORY — DX: Chronic pain syndrome: G89.4

## 2021-06-16 HISTORY — DX: Opioid use, unspecified, uncomplicated: F11.90

## 2021-06-16 MED ORDER — TRAMADOL HCL 50 MG PO TABS: 50.0000 mg | ORAL_TABLET | Freq: Four times a day (QID) | ORAL | 2 refills | Status: AC

## 2021-06-17 ENCOUNTER — Encounter: Payer: Self-pay | Admitting: Anesthesiology

## 2021-06-17 NOTE — Progress Notes (Signed)
Virtual Visit via Telephone Note  I connected with Rick Mcbride. on 06/17/21 at  3:00 PM EDT by telephone and verified that I am speaking with the correct person using two identifiers.  Location: Patient: Home Provider: Pain control center   I discussed the limitations, risks, security and privacy concerns of performing an evaluation and management service by telephone and the availability of in person appointments. I also discussed with the patient that there may be a patient responsible charge related to this service. The patient expressed understanding and agreed to proceed.   History of Present Illness: I spoke with Rick Mcbride today via telephone as he was unable to do the video portion of the virtual conference he reports that he is doing reasonably well and trying to stay active.  The quality characteristic and distribution of his low back pain have been stable in nature.  He takes his tramadol approximately every 4 hours and this works well for him.  Otherwise he is in his usual state of health.  He denies any side effects with the medications and reports approximately 50 to 75% improvement with the tramadol.   Review of systems: General: No fevers or chills Pulmonary: No shortness of breath or dyspnea Cardiac: No angina or palpitations or lightheadedness GI: No abdominal pain or constipation Psych: No depression    Observations/Objective:  Current Outpatient Medications:    traMADol (ULTRAM) 50 MG tablet, Take 1 tablet (50 mg total) by mouth 4 (four) times daily., Disp: 120 tablet, Rfl: 2   acetaminophen (TYLENOL) 500 MG tablet, Take 1,000 mg by mouth 2 (two) times daily. , Disp: , Rfl:    amLODipine (NORVASC) 5 MG tablet, Take 1 tablet (5 mg total) by mouth daily., Disp: 7 tablet, Rfl: 0   ascorbic acid (VITAMIN C) 1000 MG tablet, Take by mouth., Disp: , Rfl:    aspirin EC 81 MG tablet, Take 81 mg by mouth daily. , Disp: , Rfl:    atorvastatin (LIPITOR) 20 MG tablet, Take  40 mg by mouth at bedtime., Disp: , Rfl:    Calcium Carb-Cholecalciferol (CALCIUM CARBONATE-VITAMIN D3) 600-400 MG-UNIT TABS, Take 1 tablet by mouth daily at 12 noon. , Disp: , Rfl:    Calcium Carbonate-Vitamin D 600-200 MG-UNIT CAPS, Take by mouth., Disp: , Rfl:    carisoprodol (SOMA) 350 MG tablet, Take 350 mg by mouth every 6 (six) hours as needed for muscle spasms., Disp: , Rfl:    cetirizine (ZYRTEC) 10 MG tablet, Take 10 mg by mouth daily., Disp: , Rfl:    Chlorpheniramine Maleate 12 MG TBCR, Take 12 mg by mouth daily., Disp: , Rfl:    clopidogrel (PLAVIX) 75 MG tablet, Take 75 mg by mouth daily. , Disp: , Rfl:    clotrimazole (LOTRIMIN) 1 % cream, Apply 1 application topically 2 (two) times daily., Disp: , Rfl:    Docusate Sodium (DSS) 100 MG CAPS, Take by mouth., Disp: , Rfl:    donepezil (ARICEPT) 5 MG tablet, Take 5 mg by mouth at bedtime., Disp: , Rfl:    econazole nitrate 1 % cream, Apply topically 3 (three) times daily., Disp: , Rfl:    ezetimibe (ZETIA) 10 MG tablet, Take 10 mg by mouth daily., Disp: , Rfl:    ferrous sulfate (SLOW FE) 160 (50 FE) MG TBCR SR tablet, Take 1 tablet by mouth in the morning and at bedtime. , Disp: , Rfl:    fluticasone (FLONASE) 50 MCG/ACT nasal spray, Place 2 sprays into both  nostrils 2 (two) times daily. , Disp: , Rfl:    losartan (COZAAR) 100 MG tablet, Take 100 mg by mouth daily., Disp: , Rfl:    Melatonin 10 MG TABS, Take 10 mg by mouth at bedtime. , Disp: , Rfl:    metoprolol succinate (TOPROL-XL) 25 MG 24 hr tablet, Take 25 mg by mouth daily., Disp: , Rfl:    Multiple Vitamin (MULTIVITAMIN WITH MINERALS) TABS tablet, Take 1 tablet by mouth daily. , Disp: , Rfl:    Multiple Vitamins-Minerals (PRESERVISION/LUTEIN) CAPS, Take 1 capsule by mouth 2 (two) times daily. , Disp: , Rfl:    omeprazole (PRILOSEC) 20 MG capsule, Take 20 mg by mouth daily., Disp: , Rfl:    pantoprazole (PROTONIX) 40 MG tablet, Take 40 mg by mouth daily., Disp: , Rfl:    senna  (SENOKOT) 8.6 MG tablet, Take 1 tablet by mouth daily., Disp: , Rfl:    vitamin B-12 (CYANOCOBALAMIN) 1000 MCG tablet, Take 1,000 mcg by mouth daily., Disp: , Rfl:    zolpidem (AMBIEN) 10 MG tablet, Take 10 mg by mouth at bedtime as needed for sleep. , Disp: , Rfl:    Assessment and Plan: 1. DDD (degenerative disc disease), lumbar   2. Failed back surgical syndrome   3. Sciatica of left side   4. Weakness of both legs   5. Spinal stenosis, lumbar region, with neurogenic claudication   6. Arthralgia of left lower leg   7. Chronic right shoulder pain   8. Numbness of right foot   9. Chronic pain syndrome   10. Chronic, continuous use of opioids   Based on her discussion today and upon review of the [facility] practitioner database information it is appropriate for refill for his tramadol.  He is doing well with this regimen.  I will refill for the next 3 months the tramadol prescription and he can use this 4 times daily dosing.  Requested that he return to clinic for a urine drug screen.  No other changes will be made in his pain management protocol and have encouraged him to continue follow-up with his primary care physician for baseline medical care.  Follow Up Instructions:    I discussed the assessment and treatment plan with the patient. The patient was provided an opportunity to ask questions and all were answered. The patient agreed with the plan and demonstrated an understanding of the instructions.   The patient was advised to call back or seek an in-person evaluation if the symptoms worsen or if the condition fails to improve as anticipated.  I provided 30 minutes of non-face-to-face time during this encounter.   Molli Barrows, MD

## 2021-06-22 ENCOUNTER — Other Ambulatory Visit: Payer: Self-pay

## 2021-06-22 ENCOUNTER — Ambulatory Visit: Payer: PPO | Attending: Neurology

## 2021-06-22 DIAGNOSIS — M6281 Muscle weakness (generalized): Secondary | ICD-10-CM

## 2021-06-22 DIAGNOSIS — R2689 Other abnormalities of gait and mobility: Secondary | ICD-10-CM | POA: Diagnosis not present

## 2021-06-22 DIAGNOSIS — R2681 Unsteadiness on feet: Secondary | ICD-10-CM

## 2021-06-22 DIAGNOSIS — R278 Other lack of coordination: Secondary | ICD-10-CM | POA: Diagnosis not present

## 2021-06-22 NOTE — Therapy (Signed)
[place] MAIN [facility] SERVICES [address] [place], [place], [address] Phone: [phone]   Fax:  [phone]  Physical Therapy Evaluation  Patient Details  Name: Rick Mcbride. MRN: [id number] Date of Birth: 1941-02-24 Referring Provider (PT): Vladimir Crofts, MD  Encounter Date: 06/22/2021   PT End of Session - 06/23/21 1707     Visit Number 1    Number of Visits 25    Date for PT Re-Evaluation 09/14/21    Authorization Type eval copmleted 1/32, cert for 80/0/1027    PT Start Time 1604    PT Stop Time 80    PT Time Calculation (min) 58 min    Equipment Utilized During Treatment Gait belt    Activity Tolerance Patient tolerated treatment well;Patient limited by fatigue    Behavior During Therapy WFL for tasks assessed/performed             Past Medical History:  Diagnosis Date   AAA (abdominal aortic aneurysm) (HCC)    AAA (abdominal aortic aneurysm) without rupture (HCC)    Anemia    Aneurysm ([place])    abd aortic   Anxiety    Atrophic kidney    Cervical radiculopathy    Chronic airway obstruction ([place])    not aware of this   Chronic kidney disease (CKD), stage III (moderate) ([place])    followed by Dr. 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 Lucky Cowboy   Lung cancer [facility])    Lung cancer ([place])    Meralgia  paresthetica    Meralgia paresthetica    Neuralgia    Numbness of right foot 02/19/2021   Osteoarthritis    Osteoarthritis    s/p L knee surgery   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   Presence of permanent cardiac pacemaker    Prostate cancer ([place])    Second degree AV block    Followed by Dr. Tiburcio Bash node dysfunction [facility])    Status post partial lobectomy of lung    bottom right    Stroke [facility])    TIA (transient ischemic attack)     Past Surgical History:  Procedure Laterality Date   CATARACT EXTRACTION     COLONOSCOPY     COLONOSCOPY     COLONOSCOPY WITH PROPOFOL N/A 10/20/2015   Procedure: COLONOSCOPY WITH PROPOFOL;  Surgeon: Manya Silvas, MD;  Location: [facility] ENDOSCOPY;  Service: Endoscopy;  Laterality: N/A;   COLONOSCOPY WITH PROPOFOL N/A 11/28/2020   Procedure: COLONOSCOPY WITH PROPOFOL;  Surgeon: Robert Bellow, MD;  Location: [facility] ENDOSCOPY;  Service: Endoscopy;  Laterality: N/A;   CORONARY ARTERY BYPASS GRAFT     triple   coronary  atherosclerosis of autologous vein bypass graft     EMBOLIZATION Right 12/27/2016   Procedure: Embolization;  Surgeon: Algernon Huxley, MD;  Location: [place] CV LAB;  Service: Cardiovascular;  Laterality: Right;   ENDOVASCULAR REPAIR/STENT GRAFT N/A 01/05/2017   Procedure: Endovascular Repair/Stent Graft;  Surgeon: Algernon Huxley, MD;  Location: [place] CV LAB;  Service: Cardiovascular;  Laterality: N/A;   EYE SURGERY Bilateral    cataract extraction   INSERT / REPLACE / REMOVE PACEMAKER     JOINT REPLACEMENT     shoulder and knees   KNEE ARTHROSCOPY     LOBECTOMY  01/31/13   RLL w/squamous cell carcinoma lobectomy   LUNG REMOVAL, PARTIAL  2014   right lower lobe   PACEMAKER INSERTION     PACEMAKER INSERTION  12/2012   Dual chanber pacemaker generator   partial shoulder replacement Right    POLYPECTOMY     PROSTATECTOMY     TOTAL KNEE ARTHROPLASTY Bilateral    TOTAL SHOULDER  ARTHROPLASTY Left 07/10/2015   Procedure: TOTAL SHOULDER ARTHROPLASTY;  Surgeon: [name], MD;  Location: [facility] ORS;  Service: Orthopedics;  Laterality: Left;   TOTAL SHOULDER REPLACEMENT      There were no vitals filed for this visit.    Subjective Assessment - 06/22/21 1605     Subjective Pt presents to PT clinic in transport chair and with SPC. He has been to this clinic before for PT d/t back pain, LE weakness and following shoulder surgeries. Pt now presents to PT with c/o decreased activity tolerance, gait ability, and continued decrease in LE strength following back surgery in 03/16/2017 that he states, "went wrong." Pt now has numbness that travels length of LLE. He reports numbness in R foot and ankle. Pt reports his LLE is "worse" than his RLE. Prior to surgery pt ambulated without an AD, but has used a SPC ever since. He reports he was referred to PT d/t fall from his bed 2 months ago. The pt reports he has chronic LBP, but is currently feeling OK d/t taking pain medication prior to PT. He reports his worst back pain reaches a 3-4/10.    Pertinent History Pt presents to PT clinic in transport chair and with SPC. He has been to this clinic before for PT d/t back pain, LE weakness and following shoulder surgeries. Pt now presents to PT with c/o decreased activity tolerance, gait ability, and continued decrease in LE strength following back surgery in 03/16/2017 that he states, "went wrong." Pt now has numbness that travels length of LLE. He reports numbness in R foot and ankle. Pt reports his LLE is "worse" than his RLE. Prior to surgery pt ambulated without an AD, but has used a SPC ever since. He reports he was referred to PT d/t fall from his bed 2 months ago. The pt reports he has chronic LBP, but is currently feeling OK d/t taking pain medication prior to PT. He reports his worst back pain reaches a 3-4/10. 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    Limitations Sitting;Walking;Standing;Lifting;House hold activities    How long can you sit comfortably? Pt reports he can sit comfortably for  only a few minutes if he has not taken pain medication    How long can you stand comfortably? <10 minutes    How long can you walk comfortably? 10 minutes    Diagnostic tests No recent imaging    Patient Stated Goals Pt would like to get stronger    Currently in Pain? No/denies   none currently d/t pt taking pain medication prior to appointment. Reports chronic LBP            SUBJECTIVE Chief complaints: Weakness, decreased activity tolerance/endurance and gait ability, impaired LE sensation. Onset: May 2018 after back surgery. However, does report LE weakness prior to back surgery, but states it worsened after.  Recent changes in overall health/medication: No Directional pattern for falls: 1 fall reported. Pt slid from bed Prior history of physical therapy for balance: yes Follow-up appointment with MD: Next week Red flags (bowel/bladder changes, saddle paresthesia, personal history of cancer, chills/fever, night sweats, unrelenting pain) Hx of cancer, denies night sweats, unrelenting pain, saddle paresthesia, b/b changes, chills/fever   OBJECTIVE  MUSCULOSKELETAL: Tremor: Absent Bulk: decreased in LEs Tone: Normal, no clonus  Posture Crouched, forward head-posture in seated and standing, increased hip ER  Gait Pt with decreased step-length, with midfoot strike, increased hip ER, hip flexion and knee flexion throughout entirety of gait cycle. Decreased gait speed and ability (see 10MWT and 2 MinuteWT)  Strength R/L Grossly 4/5  BLEs  Transfers: Pt able to perform from standard chair but with supervision. Requires min assist to/from transport chair.   NEUROLOGICAL:  Sensation Reports sensation to light touch, however, reports LLE sensation decreased compared to RLE. Reports numbness in legs and feet. Proprioception and hot/cold testing deferred on this date  Coordination/Cerebellar Finger to Nose: WNL Rapid alternating movements: WNL   FUNCTIONAL OUTCOME MEASURES   Results Comments  TUG 17 seconds Has difficulty with turns, decreased speed, unsteady, requires close CGA. Indicates increased fall risk  5TSTS 15.51 seconds   2 Minute Walk test 296 ft with SPC Limited d/t SOB that resolves with rest  10 Meter Gait Speed 0.65 m/s with SPC Below normative values for full community ambulation, pt exhibits multiple gait mechanics deficits    FOTO: 45 (goal 52)  HEP:  Access Code: [email or web address] URL: [email or web address] Date: 06/22/2021 Prepared by: Ricard Dillon  Exercises Seated March - 1 x daily - 4 x weekly - 3 sets - 5 reps (*corrected in handout to 15 reps) Seated Hip Abduction with Resistance - 1 x daily - 4 x weekly - 2 sets - 20 reps   ASSESSMENT Clinical Impression: Pt is a pleasant 80 year-old male referred for impaired LE sensation who c/o progressive decline in BLE strength, activity tolerance, and gait ability following back surgery (03/16/2017). PT exam testing reveals gross BLE strength decrease, impaired LE sensation, impaired gait mechanics/speed/ability, and increased fall risk. These impairments are impacting pt's ability to safely perform transfers, carry out ADLs, and participate in the community. PT issued seated LE strengthening HEP on this date with pt demonstrating good technique following cuing. Balance should be further assessed next session. Pt will benefit from skilled PT services to address deficits in strength, endurance, and gait in order to increase QOL and decrease fall  risk.       PLAN Next Visit: strengthening, gait training, balance HEP: see above  [facility] PT Assessment - 06/22/21 2100       Assessment   Medical Diagnosis Disturbance of skin sensation    Referring Provider (PT)  Vladimir Crofts, MD    Onset Date/Surgical Date 03/16/17   steadily worse since back surgery   Hand Dominance Right    Next MD Visit next week with Dr Manuella Ghazi    Prior Therapy yes d/t prior surgeries      Precautions   Precautions Fall      Restrictions   Weight Bearing Restrictions No      Balance Screen   Has the patient fallen in the past 6 months Yes    How many times? 1      [place] Spouse/significant other    Type of Home Other(Comment)   Alpha to enter    Entrance Stairs-Number of Steps 1/2   1/2 step   Entrance Stairs-Rails None    Home Layout One level    [place] - 2 wheels;Wheelchair - manual;Grab bars - toilet;Grab bars - tub/shower;Shower seat - built in;Cane - single point   high toilet     Prior Function   Level of Independence Independent    Vocation Retired    Leisure exercise, watch TV, family      Cognition   Overall Cognitive Status Within Functional Limits for tasks assessed    Memory Impaired    Memory Impairment Decreased short term memory              Objective measurements completed on examination: See above findings.       PT Long Term Goals - 06/22/21 1630       PT LONG TERM GOAL #1   Title Patient will increase FOTO score to equal to or greater than 52  to demonstrate statistically significant improvement in mobility and quality of life.    Baseline 8/15: 45    Time 12    Period Weeks    Status New    Target Date 09/14/21      PT LONG TERM GOAL #2   Title Pt will decrease TUG to below 14 seconds/decrease in order to demonstrate decreased fall risk.    Baseline 8/15: 17 sec with SPC, close CGA    Time 12     Period Weeks    Status New    Target Date 09/14/21      PT LONG TERM GOAL #3   Title Pt will decrease 5TSTS by at least 3 seconds in order to demonstrate clinically significant improvement in LE strength.    Baseline 8/15: 15.51 sec without use of BUEs, but does not achieve full ROM with some reps    Time 12    Period Weeks    Status New    Target Date 09/14/21      PT LONG TERM GOAL #4   Title Patient will increase 10 meter walk test to >1.56m/s as to improve gait speed for better community ambulation and to reduce fall risk.    Baseline 8/15: 0.65 m/s with SPC    Time 12    Period Weeks    Status New    Target Date 09/14/21      PT LONG TERM GOAL #5   Title Patient will increase BLE gross strength to 4+/5 as to improve functional strength for independent gait, increased standing tolerance and increased ADL ability.    Baseline 8/15: strength in LEs grossly 4/5 B    Time 12    Period Weeks    Status New  Target Date 09/14/21      Additional Long Term Goals   Additional Long Term Goals Yes      PT LONG TERM GOAL #6   Title Pt will increase 2MWT by at least 28m (179ft) in order to demonstrate an improvement in cardiopulmonary endurance and community ambulation    Baseline 8/15: 296 ft with [facility]    Time 12    Period Weeks    Status New    Target Date 09/14/21                    Plan - 06/23/21 1708     Clinical Impression Statement Pt is a pleasant 80 year-old male referred for impaired LE sensation who c/o progressive decline in BLE strength, activity tolerance, and gait ability following back surgery (03/16/2017). PT exam testing reveals gross BLE strength decrease, impaired LE sensation, impaired gait mechanics/speed/ability, and increased fall risk. These impairments are impacting pt's ability to safely perform transfers, carry out ADLs, and participate in the community. PT issued seated LE strengthening HEP on this date with pt demonstrating good technique  following cuing. Balance should be further assessed next session. Pt will benefit from skilled PT services to address deficits in strength, endurance, and gait in order to increase QOL and decrease fall risk.    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    Examination-Activity Limitations Bathing;Carry;Lift;Sit;Stand;Bed Mobility;Locomotion Level;Bend;Dressing;Squat;Stairs;Transfers    Examination-Participation Restrictions Medication Management;Cleaning;Meal Prep;Yard Work;Community Activity;Laundry;Shop    Stability/Clinical Decision Making Evolving/Moderate complexity    Clinical Decision Making Moderate    Rehab Potential Fair    PT Frequency 2x / week    PT Duration 12 weeks    PT Treatment/Interventions ADLs/Self Care Home Management;Aquatic Therapy;Moist Heat;Traction;DME Instruction;Gait training;Stair training;Functional mobility training;Therapeutic activities;Therapeutic exercise;Balance training;Neuromuscular re-education;Patient/family education;Orthotic Fit/Training;Wheelchair mobility training;Manual techniques;Passive range of motion;Dry needling;Energy conservation;Splinting;Taping;Joint Manipulations    PT Next Visit Plan strengthening, gait exercises, further assessment of balance    PT Home Exercise Plan Access Code: [email or web address]    Consulted and Agree with Plan of Care Patient             Patient will benefit from skilled therapeutic intervention in order to  improve the following deficits and impairments:  Abnormal gait, Decreased activity tolerance, Decreased endurance, Decreased range of motion, Impaired sensation, Decreased strength, Hypomobility, Improper body mechanics, Pain, Cardiopulmonary status limiting activity, Decreased balance, Decreased coordination, Decreased mobility, Difficulty walking, Increased muscle spasms, Impaired flexibility, Postural dysfunction  Visit Diagnosis: Muscle weakness (generalized)  Other abnormalities of gait and mobility  Unsteadiness on feet  Other lack of coordination     Problem List Patient Active Problem List   Diagnosis Date Noted   Chronic pain syndrome 06/16/2021   Chronic, continuous use of opioids 06/16/2021   Numbness of right foot 02/19/2021   Aortic atherosclerosis ([place]) 10/15/2020   Rotator cuff tendinitis, right 01/25/2020   Status post right shoulder hemiarthroplasty 01/25/2020   Anemia in chronic kidney disease 11/29/2019   Benign hypertensive kidney disease with chronic kidney disease 11/29/2019   Chronic kidney disease, stage IV (severe) ([place]) 11/29/2019  Hematuria 11/29/2019   Proteinuria 11/29/2019   Secondary hyperparathyroidism of renal origin ([place]) 11/29/2019   Chronic right shoulder pain 11/12/2019   Thoracic aortic aneurysm without rupture ([place]) 10/09/2019   Weakness 06/07/2017   Olecranon fracture, left, closed, initial encounter 05/27/2017   Spondylolisthesis of lumbosacral region 03/16/2017   Low back pain 02/01/2017   Escherichia coli (E. coli) infection 01/28/2017   Acute on chronic renal failure (HCC) 01/28/2017   Elevated troponin 01/28/2017   Generalized weakness 01/28/2017   Essential hypertension 01/28/2017   UTI (urinary tract infection) 01/26/2017   AAA (abdominal aortic aneurysm) without rupture ([place]) 11/23/2016   Chronic obstructive pulmonary disease ([place]) 10/09/2015   Personal history of diseases of skin or subcutaneous tissue 10/09/2015    Malignant neoplasm of prostate ([place]) 10/09/2015   Pure hypercholesterolemia 10/09/2015   Sinoatrial node dysfunction ([place]) 10/09/2015   History of surgical procedure 08/22/2015   Status post total shoulder replacement 07/10/2015   Arthritis of shoulder region, degenerative 06/10/2015   Benign essential HTN 06/03/2015   Absolute anemia 04/12/2015   Atrophic kidney 04/12/2015   Essential (primary) hypertension 04/12/2015   Acid reflux 04/12/2015   Cancer of lung ([place]) 04/12/2015   CA of prostate ([place]) 04/12/2015   H/O adenomatous polyp of colon 01/15/2015   Temporary cerebral vascular dysfunction 11/21/2014   Calcific shoulder tendinitis 08/01/2014   Cervical nerve root disorder 04/07/2012   Ricard Dillon PT, DPT  06/23/2021, 5:36 PM  [place] MAIN [facility] SERVICES [address] [place], [place], [address] Phone: [phone]   Fax:  [phone]  Name: Barnet Benavides. MRN: [id number] Date of Birth: 1941/08/29

## 2021-06-23 DIAGNOSIS — D3702 Neoplasm of uncertain behavior of tongue: Secondary | ICD-10-CM | POA: Diagnosis not present

## 2021-06-23 DIAGNOSIS — R042 Hemoptysis: Secondary | ICD-10-CM | POA: Diagnosis not present

## 2021-06-24 ENCOUNTER — Other Ambulatory Visit: Payer: Self-pay | Admitting: Unknown Physician Specialty

## 2021-06-24 DIAGNOSIS — D3702 Neoplasm of uncertain behavior of tongue: Secondary | ICD-10-CM

## 2021-06-25 ENCOUNTER — Other Ambulatory Visit: Payer: Self-pay

## 2021-06-25 ENCOUNTER — Ambulatory Visit: Payer: PPO

## 2021-06-25 DIAGNOSIS — M6281 Muscle weakness (generalized): Secondary | ICD-10-CM

## 2021-06-25 DIAGNOSIS — R2681 Unsteadiness on feet: Secondary | ICD-10-CM

## 2021-06-25 DIAGNOSIS — R2689 Other abnormalities of gait and mobility: Secondary | ICD-10-CM

## 2021-06-25 DIAGNOSIS — R278 Other lack of coordination: Secondary | ICD-10-CM

## 2021-06-25 NOTE — Therapy (Signed)
[place] MAIN [facility] SERVICES [address] [place], [place], [address] Phone: [phone]   Fax:  [phone]  Physical Therapy Treatment  Patient Details  Name: Rick Mcbride. MRN: [id number] Date of Birth: Oct 10, 1941 Referring Provider (PT): Vladimir Crofts, MD   Encounter Date: 06/25/2021   PT End of Session - 06/25/21 1617     Visit Number 2    Number of Visits 25    Date for PT Re-Evaluation 09/14/21    Authorization Type [employer]    Authorization Time Period 06/22/21-09/14/21    PT Start Time 1609    PT Stop Time 1640    PT Time Calculation (min) 31 min    Equipment Utilized During Treatment Gait belt    Activity Tolerance Patient tolerated treatment well;Patient limited by fatigue    Behavior During Therapy WFL for tasks assessed/performed             Past Medical History:  Diagnosis Date   AAA (abdominal aortic aneurysm) (HCC)    AAA (abdominal aortic aneurysm) without rupture (HCC)    Anemia    Aneurysm ([place])    abd aortic   Anxiety    Atrophic kidney    Cervical radiculopathy    Chronic airway obstruction ([place])    not aware of this   Chronic kidney disease (CKD), stage III (moderate) ([place])    followed by Dr. 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 Lucky Cowboy   Lung cancer [facility])    Lung  cancer ([place])    Meralgia paresthetica    Meralgia paresthetica    Neuralgia    Numbness of right foot 02/19/2021   Osteoarthritis    Osteoarthritis    s/p L knee surgery   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   Presence of permanent cardiac pacemaker    Prostate cancer ([place])    Second degree AV block    Followed by Dr. Tiburcio Bash node dysfunction [facility])    Status post partial lobectomy of lung    bottom right    Stroke [facility])    TIA (transient ischemic attack)     Past Surgical History:  Procedure Laterality Date   CATARACT EXTRACTION     COLONOSCOPY     COLONOSCOPY     COLONOSCOPY WITH PROPOFOL N/A 10/20/2015   Procedure: COLONOSCOPY WITH PROPOFOL;  Surgeon: Manya Silvas, MD;  Location: [facility] ENDOSCOPY;  Service: Endoscopy;  Laterality: N/A;   COLONOSCOPY WITH PROPOFOL N/A 11/28/2020   Procedure: COLONOSCOPY WITH PROPOFOL;  Surgeon: Robert Bellow, MD;  Location: [facility] ENDOSCOPY;  Service: Endoscopy;  Laterality: N/A;   CORONARY ARTERY BYPASS GRAFT  triple   coronary atherosclerosis of autologous vein bypass graft     EMBOLIZATION Right 12/27/2016   Procedure: Embolization;  Surgeon: Algernon Huxley, MD;  Location: [place] CV LAB;  Service: Cardiovascular;  Laterality: Right;   ENDOVASCULAR REPAIR/STENT GRAFT N/A 01/05/2017   Procedure: Endovascular Repair/Stent Graft;  Surgeon: Algernon Huxley, MD;  Location: [place] CV LAB;  Service: Cardiovascular;  Laterality: N/A;   EYE SURGERY Bilateral    cataract extraction   INSERT / REPLACE / REMOVE PACEMAKER     JOINT REPLACEMENT     shoulder and knees   KNEE ARTHROSCOPY     LOBECTOMY  01/31/13   RLL w/squamous cell carcinoma lobectomy   LUNG REMOVAL, PARTIAL  2014   right lower lobe   PACEMAKER INSERTION     PACEMAKER INSERTION  12/2012   Dual chanber pacemaker generator   partial shoulder replacement Right    POLYPECTOMY     PROSTATECTOMY     TOTAL KNEE ARTHROPLASTY Bilateral     TOTAL SHOULDER ARTHROPLASTY Left 07/10/2015   Procedure: TOTAL SHOULDER ARTHROPLASTY;  Surgeon: [name], MD;  Location: [facility] ORS;  Service: Orthopedics;  Laterality: Left;   TOTAL SHOULDER REPLACEMENT      There were no vitals filed for this visit.   Subjective Assessment - 06/25/21 1614     Subjective Pt doing well today, no pain, no medical updates. He reports trying out HEP activity without issue.    Pertinent History Pt presents to PT clinic in transport chair and with SPC. He has been to this clinic before for PT d/t back pain, LE weakness and following shoulder surgeries. Pt now presents to PT with c/o decreased activity tolerance, gait ability, and continued decrease in LE strength following back surgery in 03/16/2017 that he states, "went wrong." Pt now has numbness that travels length of LLE. He reports numbness in R foot and ankle. Pt reports his LLE is "worse" than his RLE. Prior to surgery pt ambulated without an AD, but has used a SPC ever since. He reports he was referred to PT d/t fall from his bed 2 months ago. The pt reports he has chronic LBP, but is currently feeling OK d/t taking pain medication prior to PT. He reports his worst back pain reaches a 3-4/10. 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    Currently in Pain? No/denies             INTERVENTION THIS DATE: -AMB 159ft around clinic, carries his SPC, no LOB, gait partially shuffling, partially steppage, very minimal trunk movement -STS 10x from chair + airex, hands free, supervision level assistance *rest -STS 10x from chair + airex, hands free, supervision level  assistance *rest -normal stance eyes closed 2x20sec minGuard  *rest  -normal stance eyes closed 2x20sec minGuard  *rest  -Firm surface chest press c SPC x10 (shoulders not aggravated)  -foam surface chest press c SPC x15 (minGuard Assist) (shoulders not aggravated)  *rest  -firm surface trunk rotation c green physball 20x alternating sides (shoulders not aggravated)  *rest  -lateral side stepping in // bars, hands free at miGuard assist (3 round trips, leg fatigue starts during 3rd lap)  *  rest           PT Education - 06/25/21 1614     Education Details Monitoring actiivty    Person(s) Educated Patient    Methods Explanation;Demonstration    Comprehension Verbalized understanding;Returned demonstration              PT Short Term Goals - 06/22/21 1629       PT SHORT TERM GOAL #1   Title Patient will be independent in home exercise program to improve strength/mobility for better functional independence with ADLs.    Baseline 8/15: initial HEP initiated    Time 6    Period Weeks    Status New    Target Date 08/03/21               PT Long Term Goals - 06/22/21 1630       PT LONG TERM GOAL #1   Title Patient will increase FOTO score to equal to or greater than 52  to demonstrate statistically significant improvement in mobility and quality of life.    Baseline 8/15: 45    Time 12    Period Weeks    Status New    Target Date 09/14/21      PT LONG TERM GOAL #2   Title Pt will decrease TUG to below 14 seconds/decrease in order to demonstrate decreased fall risk.    Baseline 8/15: 17 sec with SPC, close CGA    Time 12    Period Weeks    Status New    Target Date 09/14/21      PT LONG TERM GOAL #3   Title Pt will decrease 5TSTS by at least 3 seconds in order to demonstrate clinically significant improvement in LE strength.    Baseline 8/15: 15.51 sec without use of BUEs, but does not achieve full ROM with some reps    Time 12    Period Weeks    Status  New    Target Date 09/14/21      PT LONG TERM GOAL #4   Title Patient will increase 10 meter walk test to >1.28m/s as to improve gait speed for better community ambulation and to reduce fall risk.    Baseline 8/15: 0.65 m/s with SPC    Time 12    Period Weeks    Status New    Target Date 09/14/21      PT LONG TERM GOAL #5   Title Patient will increase BLE gross strength to 4+/5 as to improve functional strength for independent gait, increased standing tolerance and increased ADL ability.    Baseline 8/15: strength in LEs grossly 4/5 B    Time 12    Period Weeks    Status New    Target Date 09/14/21      Additional Long Term Goals   Additional Long Term Goals Yes      PT LONG TERM GOAL #6   Title Pt will increase 2MWT by at least 40m (116ft) in order to demonstrate an improvement in cardiopulmonary endurance and community ambulation    Baseline 8/15: 296 ft with [facility]    Time 12    Period Weeks    Status New    Target Date 09/14/21                   Plan - 06/25/21 1619     Clinical Impression Statement Began to establish basic strengthening and postural motor control today. Pt has very consistent tolerated  time on feet prior to rapid onset of BLE weakness. Pt makes concerted efforts to participate to the extent of his ability. Pt is able to communicate his safety limitations. Pt reports being quite fatigued at end of session.    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    Examination-Activity Limitations Bathing;Carry;Lift;Sit;Stand;Bed Mobility;Locomotion Level;Bend;Dressing;Squat;Stairs;Transfers    Examination-Participation Restrictions Medication Management;Cleaning;Meal Prep;Yard Work;Community Activity;Laundry;Shop    Stability/Clinical Decision Making Evolving/Moderate complexity    Clinical Decision Making Moderate    Rehab Potential Fair    PT Frequency 2x / week    PT Duration 12 weeks    PT Treatment/Interventions ADLs/Self Care Home Management;Aquatic Therapy;Moist Heat;Traction;DME Instruction;Gait training;Stair training;Functional mobility training;Therapeutic activities;Therapeutic exercise;Balance training;Neuromuscular re-education;Patient/family education;Orthotic Fit/Training;Wheelchair mobility training;Manual techniques;Passive range of motion;Dry needling;Energy conservation;Splinting;Taping;Joint Manipulations    PT Next Visit Plan strengthening, gait exercises, further assessment of balance    PT Home Exercise Plan Access Code: [email or web address]    Consulted and Agree with Plan of Care Patient             Patient will benefit from skilled therapeutic intervention in order to improve the following deficits and impairments:  Abnormal gait, Decreased activity tolerance, Decreased endurance, Decreased range of motion, Impaired sensation, Decreased strength, Hypomobility, Improper body mechanics, Pain, Cardiopulmonary status limiting activity, Decreased balance, Decreased coordination, Decreased mobility, Difficulty walking, Increased muscle spasms, Impaired flexibility, Postural dysfunction  Visit Diagnosis: Muscle weakness (generalized)  Other abnormalities of gait and mobility  Unsteadiness on feet  Other lack of coordination     Problem List Patient Active Problem List   Diagnosis Date Noted   Chronic pain syndrome 06/16/2021   Chronic, continuous use of opioids 06/16/2021   Numbness of right foot 02/19/2021    Aortic atherosclerosis ([place]) 10/15/2020   Rotator cuff tendinitis, right 01/25/2020   Status post right shoulder hemiarthroplasty 01/25/2020   Anemia in chronic kidney disease 11/29/2019   Benign hypertensive kidney disease with chronic kidney disease 11/29/2019   Chronic kidney disease, stage IV (severe) (HCC) 11/29/2019   Hematuria 11/29/2019   Proteinuria 11/29/2019   Secondary hyperparathyroidism of renal origin ([place]) 11/29/2019   Chronic right shoulder pain 11/12/2019   Thoracic aortic aneurysm without rupture ([place]) 10/09/2019   Weakness 06/07/2017   Olecranon fracture, left, closed, initial encounter 05/27/2017   Spondylolisthesis of lumbosacral region 03/16/2017   Low back pain 02/01/2017   Escherichia coli (E. coli) infection 01/28/2017   Acute on chronic renal failure (HCC) 01/28/2017   Elevated troponin 01/28/2017   Generalized weakness 01/28/2017   Essential hypertension 01/28/2017   UTI (urinary tract infection) 01/26/2017   AAA (abdominal aortic aneurysm) without rupture ([place]) 11/23/2016   Chronic obstructive pulmonary disease ([place]) 10/09/2015   Personal history of diseases of skin or subcutaneous tissue 10/09/2015   Malignant neoplasm of prostate ([place]) 10/09/2015   Pure hypercholesterolemia 10/09/2015   Sinoatrial node dysfunction ([place]) 10/09/2015   History of surgical procedure 08/22/2015   Status post total shoulder replacement 07/10/2015   Arthritis of shoulder region, degenerative 06/10/2015   Benign essential HTN 06/03/2015   Absolute anemia 04/12/2015   Atrophic  kidney 04/12/2015   Essential (primary) hypertension 04/12/2015   Acid reflux 04/12/2015   Cancer of lung ([place]) 04/12/2015   CA of prostate ([place]) 04/12/2015   H/O adenomatous polyp of colon 01/15/2015   Temporary cerebral vascular dysfunction 11/21/2014   Calcific shoulder tendinitis 08/01/2014   Cervical nerve root disorder 04/07/2012   4:53 PM, 06/25/21 Etta Grandchild, PT, DPT Physical  Therapist - [facility]  Outpatient Physical Therapy- [place] [phone]     Murphy C 06/25/2021, 4:23 PM  [place] MAIN [facility] SERVICES [address] [place], [place], [address] Phone: [phone]   Fax:  [phone]  Name: Rick Mcbride. MRN: [id number] Date of Birth: 03-26-1941

## 2021-06-29 ENCOUNTER — Ambulatory Visit: Payer: PPO

## 2021-06-29 ENCOUNTER — Other Ambulatory Visit: Payer: Self-pay

## 2021-06-29 DIAGNOSIS — R2681 Unsteadiness on feet: Secondary | ICD-10-CM

## 2021-06-29 DIAGNOSIS — R2689 Other abnormalities of gait and mobility: Secondary | ICD-10-CM

## 2021-06-29 DIAGNOSIS — M6281 Muscle weakness (generalized): Secondary | ICD-10-CM

## 2021-06-29 DIAGNOSIS — R278 Other lack of coordination: Secondary | ICD-10-CM

## 2021-06-29 NOTE — Therapy (Signed)
[place] MAIN [facility] [address] [place], [place], [address] Phone: [phone]   Fax:  [phone]  Physical Therapy Treatment  Patient Details  Name: Rick Mcbride. MRN: [id number] Date of Birth: May 18, 1941 Referring Provider (PT): Vladimir Crofts, MD   Encounter Date: 06/29/2021   PT End of Session - 06/29/21 4709     Visit Number 3    Number of Visits 25    Date for PT Re-Evaluation 09/14/21    Authorization Type [employer]    Authorization Time Period 06/22/21-09/14/21    PT Start Time 1602    PT Stop Time 1640    PT Time Calculation (min) 38 min    Equipment Utilized During Treatment Gait belt    Activity Tolerance Patient tolerated treatment well;Patient limited by fatigue    Behavior During Therapy WFL for tasks assessed/performed             Past Medical History:  Diagnosis Date   AAA (abdominal aortic aneurysm) (HCC)    AAA (abdominal aortic aneurysm) without rupture (HCC)    Anemia    Aneurysm (HCC)    abd aortic   Anxiety    Atrophic kidney    Cervical radiculopathy    Chronic airway obstruction (HCC)    not aware of this   Chronic kidney disease (CKD), stage III (moderate) ([place])    followed by Dr. 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 Lucky Cowboy   Lung cancer [facility])    Lung  cancer ([place])    Meralgia paresthetica    Meralgia paresthetica    Neuralgia    Numbness of right foot 02/19/2021   Osteoarthritis    Osteoarthritis    s/p L knee surgery   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   Presence of permanent cardiac pacemaker    Prostate cancer ([place])    Second degree AV block    Followed by Dr. Tiburcio Bash node dysfunction [facility])    Status post partial lobectomy of lung    bottom right    Stroke [facility])    TIA (transient ischemic attack)     Past Surgical History:  Procedure Laterality Date   CATARACT EXTRACTION     COLONOSCOPY     COLONOSCOPY     COLONOSCOPY WITH PROPOFOL N/A 10/20/2015   Procedure: COLONOSCOPY WITH PROPOFOL;  Surgeon: Manya Silvas, MD;  Location: [facility] ENDOSCOPY;  Service: Endoscopy;  Laterality: N/A;   COLONOSCOPY WITH PROPOFOL N/A 11/28/2020   Procedure: COLONOSCOPY WITH PROPOFOL;  Surgeon: Robert Bellow, MD;  Location: [facility] ENDOSCOPY;  Service: Endoscopy;  Laterality: N/A;   CORONARY ARTERY BYPASS GRAFT  triple   coronary atherosclerosis of autologous vein bypass graft     EMBOLIZATION Right 12/27/2016   Procedure: Embolization;  Surgeon: Algernon Huxley, MD;  Location: [place] CV LAB;  Service: Cardiovascular;  Laterality: Right;   ENDOVASCULAR REPAIR/STENT GRAFT N/A 01/05/2017   Procedure: Endovascular Repair/Stent Graft;  Surgeon: Algernon Huxley, MD;  Location: [place] CV LAB;  Service: Cardiovascular;  Laterality: N/A;   EYE SURGERY Bilateral    cataract extraction   INSERT / REPLACE / REMOVE PACEMAKER     JOINT REPLACEMENT     shoulder and knees   KNEE ARTHROSCOPY     LOBECTOMY  01/31/13   RLL w/squamous cell carcinoma lobectomy   LUNG REMOVAL, PARTIAL  2014   right lower lobe   PACEMAKER INSERTION     PACEMAKER INSERTION  12/2012   Dual chanber pacemaker generator   partial shoulder replacement Right    POLYPECTOMY     PROSTATECTOMY     TOTAL KNEE ARTHROPLASTY Bilateral     TOTAL SHOULDER ARTHROPLASTY Left 07/10/2015   Procedure: TOTAL SHOULDER ARTHROPLASTY;  Surgeon: [name], MD;  Location: [facility] ORS;  Service: Orthopedics;  Laterality: Left;   TOTAL SHOULDER REPLACEMENT      There were no vitals filed for this visit.   Subjective Assessment - 06/29/21 1610     Subjective Pt doing well today, no pain, no medical updates. Good tolerance to prior session. DId not have much time for HEP due to visiting friends over the weekend.    Pertinent History Pt presents to PT clinic in transport chair and with SPC. He has been to this clinic before for PT d/t back pain, LE weakness and following shoulder surgeries. Pt now presents to PT with c/o decreased activity tolerance, gait ability, and continued decrease in LE strength following back surgery in 03/16/2017 that he states, "went wrong." Pt now has numbness that travels length of LLE. He reports numbness in R foot and ankle. Pt reports his LLE is "worse" than his RLE. Prior to surgery pt ambulated without an AD, but has used a SPC ever since. He reports he was referred to PT d/t fall from his bed 2 months ago. The pt reports he has chronic LBP, but is currently feeling OK d/t taking pain medication prior to PT. He reports his worst back pain reaches a 3-4/10. 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    Currently in Pain? No/denies              INTERVENTION THIS DATE:  INTERVENTION THIS DATE:  no LOB, gait partially shuffling, partially steppage, very minimal trunk movement  -STS 10x from chair + airex, hands free, supervision level assistance *rest -STS 10x from chair +  airex, hands free, supervision level assistance *rest -AMB 172ft around clinic, carries his SPC, cued around obstacles, lots of turns  *rest -narrow stance eyes closed 1x30sec minGuard  -normal stance eyes open, chest press 5lb   -normal stance foam EO horizontal head turns 10x, minGuard assist.  *rest -narrow stance eyes closed 1x30sec minGuard  -normal stance eyes open, chest press 5lb   -normal stance foam EO horizontal head turns 10x, minGuard assist.  *  rest -normal stance foam EO ball take ball give x12, author alternates sides  -incline static balance EO x30sec, decline static balance x30sec  *rest *12" step taps with SPC x12, SPC in ipsiplateral UE          PT Short Term Goals - 06/22/21 1629       PT SHORT TERM GOAL #1   Title Patient will be independent in home exercise program to improve strength/mobility for better functional independence with ADLs.    Baseline 8/15: initial HEP initiated    Time 6    Period Weeks    Status New    Target Date 08/03/21               PT Long Term Goals - 06/22/21 1630       PT LONG TERM GOAL #1   Title Patient will increase FOTO score to equal to or greater than 52  to demonstrate statistically significant improvement in mobility and quality of life.    Baseline 8/15: 45    Time 12    Period Weeks    Status New    Target Date 09/14/21      PT LONG TERM GOAL #2   Title Pt will decrease TUG to below 14 seconds/decrease in order to demonstrate decreased fall risk.    Baseline 8/15: 17 sec with SPC, close CGA    Time 12    Period Weeks    Status New    Target Date 09/14/21      PT LONG TERM GOAL #3   Title Pt will decrease 5TSTS by at least 3 seconds in order to demonstrate clinically significant improvement in LE strength.    Baseline 8/15: 15.51 sec without use of BUEs, but does not achieve full ROM with some reps    Time 12    Period Weeks    Status New    Target Date 09/14/21      PT LONG TERM GOAL #4    Title Patient will increase 10 meter walk test to >1.58m/s as to improve gait speed for better community ambulation and to reduce fall risk.    Baseline 8/15: 0.65 m/s with SPC    Time 12    Period Weeks    Status New    Target Date 09/14/21      PT LONG TERM GOAL #5   Title Patient will increase BLE gross strength to 4+/5 as to improve functional strength for independent gait, increased standing tolerance and increased ADL ability.    Baseline 8/15: strength in LEs grossly 4/5 B    Time 12    Period Weeks    Status New    Target Date 09/14/21      Additional Long Term Goals   Additional Long Term Goals Yes      PT LONG TERM GOAL #6   Title Pt will increase 2MWT by at least 61m (19ft) in order to demonstrate an improvement in cardiopulmonary endurance and community ambulation    Baseline 8/15: 296 ft with [facility]    Time 12    Period Weeks    Status New    Target Date 09/14/21                   Plan - 06/29/21 1614     Clinical Impression Statement Continued with current plan of care as laid out in evaluation and recent prior sessions. Pt remains motivated to advance progress toward goals in order to  maximize independence and safety at home. Pt requires high level assistance and cuing for completion of exercises in order to provide adequate level of stimulation and perturbation. Author allows pt as much opportunity as possible to perform independent righting strategies, only stepping in when pt is unable to prevent falling to floor. Pt closely monitored throughout session to maximize patient safety during interventions. Pt continues to demonstrate progress toward goals AEB progression of some interventions this date either in volume or intensity.   Personal Factors and Comorbidities Age;Comorbidity 1;Comorbidity 2;Comorbidity 3+;Fitness;Time since onset of injury/illness/exacerbation;Past/Current Experience    Comorbidities PMH includes hx of TIA with residual short term memory  dysfunction, B shoulder surgeries, back surgery 03/16/2017,  B TKR, aortic aneurysm, pacemaker, anemia, cervical radiculopathy, chronic airway obstruction, lung CA, lung removal of R lower lobe 2014, prostate CA, chronic kidney disease stage III, chronic pain syndrome, OA, chronic R shoulder pain, chronic tension headaches, CAD, DDD.    Examination-Activity Limitations Bathing;Carry;Lift;Sit;Stand;Bed Mobility;Locomotion Level;Bend;Dressing;Squat;Stairs;Transfers    Examination-Participation Restrictions Medication Management;Cleaning;Meal Prep;Yard Work;Community Activity;Laundry;Shop    Stability/Clinical Decision Making Evolving/Moderate complexity    Clinical Decision Making Moderate    Rehab Potential Fair    PT Frequency 2x / week    PT Duration 12 weeks    PT Treatment/Interventions ADLs/Self Care Home Management;Aquatic Therapy;Moist Heat;Traction;DME Instruction;Gait training;Stair training;Functional mobility training;Therapeutic activities;Therapeutic exercise;Balance training;Neuromuscular re-education;Patient/family education;Orthotic Fit/Training;Wheelchair mobility training;Manual techniques;Passive range of motion;Dry needling;Energy conservation;Splinting;Taping;Joint Manipulations    PT Next Visit Plan strengthening, gait exercises, further assessment of balance    PT Home Exercise Plan Access Code: [email or web address]    Consulted and Agree with Plan of Care Patient             Patient will benefit from skilled therapeutic intervention in order to improve the following deficits and impairments:  Abnormal gait, Decreased activity tolerance, Decreased endurance, Decreased range of motion, Impaired sensation, Decreased strength, Hypomobility, Improper body mechanics, Pain, Cardiopulmonary status limiting activity, Decreased balance, Decreased coordination, Decreased mobility, Difficulty walking, Increased muscle spasms, Impaired flexibility, Postural dysfunction  Visit Diagnosis: Muscle  weakness (generalized)  Other abnormalities of gait and mobility  Unsteadiness on feet  Other lack of coordination     Problem List Patient Active Problem List   Diagnosis Date Noted   Chronic pain syndrome 06/16/2021   Chronic, continuous use of opioids 06/16/2021   Numbness of right foot 02/19/2021   Aortic atherosclerosis ([place]) 10/15/2020   Rotator cuff tendinitis, right 01/25/2020   Status post right shoulder hemiarthroplasty 01/25/2020   Anemia in chronic kidney disease 11/29/2019   Benign hypertensive kidney disease with chronic kidney disease 11/29/2019   Chronic kidney disease, stage IV (severe) (HCC) 11/29/2019   Hematuria 11/29/2019   Proteinuria 11/29/2019   Secondary hyperparathyroidism of renal origin ([place]) 11/29/2019   Chronic right shoulder pain 11/12/2019   Thoracic aortic aneurysm without rupture ([place]) 10/09/2019   Weakness 06/07/2017   Olecranon fracture, left, closed, initial encounter 05/27/2017   Spondylolisthesis of lumbosacral region 03/16/2017   Low back pain 02/01/2017   Escherichia coli (E. coli) infection 01/28/2017   Acute on chronic renal failure (HCC) 01/28/2017   Elevated troponin 01/28/2017   Generalized weakness 01/28/2017   Essential hypertension 01/28/2017   UTI (urinary tract infection) 01/26/2017   AAA (abdominal aortic aneurysm) without rupture ([place]) 11/23/2016   Chronic obstructive pulmonary disease ([place]) 10/09/2015   Personal history of diseases of skin or subcutaneous tissue 10/09/2015   Malignant neoplasm of prostate ([place]) 10/09/2015   Pure hypercholesterolemia 10/09/2015  Sinoatrial node dysfunction (HCC) 10/09/2015   History of surgical procedure 08/22/2015   Status post total shoulder replacement 07/10/2015   Arthritis of shoulder region, degenerative 06/10/2015   Benign essential HTN 06/03/2015   Absolute anemia 04/12/2015   Atrophic kidney 04/12/2015   Essential (primary) hypertension 04/12/2015   Acid reflux  04/12/2015   Cancer of lung ([place]) 04/12/2015   CA of prostate ([place]) 04/12/2015   H/O adenomatous polyp of colon 01/15/2015   Temporary cerebral vascular dysfunction 11/21/2014   Calcific shoulder tendinitis 08/01/2014   Cervical nerve root disorder 04/07/2012   4:38 PM, 06/29/21 Etta Grandchild, PT, DPT Physical Therapist - [place] [phone]     Etta Grandchild 06/29/2021, 4:15 PM  [place] MAIN [facility] SERVICES [address] [place], [place], [address] Phone: [phone]   Fax:  [phone]  Name: Rick Mcbride. MRN: [id number] Date of Birth: 11-05-1941

## 2021-07-01 ENCOUNTER — Ambulatory Visit: Payer: PPO

## 2021-07-01 ENCOUNTER — Other Ambulatory Visit: Payer: Self-pay

## 2021-07-01 DIAGNOSIS — R278 Other lack of coordination: Secondary | ICD-10-CM

## 2021-07-01 DIAGNOSIS — M6281 Muscle weakness (generalized): Secondary | ICD-10-CM | POA: Diagnosis not present

## 2021-07-01 DIAGNOSIS — R2689 Other abnormalities of gait and mobility: Secondary | ICD-10-CM

## 2021-07-01 DIAGNOSIS — R2681 Unsteadiness on feet: Secondary | ICD-10-CM

## 2021-07-01 NOTE — Therapy (Signed)
[place] MAIN [facility] SERVICES [address] [place], [place], [address] Phone: [phone]   Fax:  [phone]  Physical Therapy Treatment  Patient Details  Name: Rick Mcbride. MRN: [id number] Date of Birth: Dec 19, 1940 Referring Provider (PT): Vladimir Crofts, MD   Encounter Date: 07/01/2021   PT End of Session - 07/01/21 1653     Visit Number 4    Number of Visits 25    Date for PT Re-Evaluation 09/14/21    Authorization Type [employer]    Authorization Time Period 06/22/21-09/14/21    PT Start Time 1602    PT Stop Time 1643    PT Time Calculation (min) 41 min    Equipment Utilized During Treatment Gait belt    Activity Tolerance Patient tolerated treatment well;Patient limited by fatigue    Behavior During Therapy WFL for tasks assessed/performed             Past Medical History:  Diagnosis Date   AAA (abdominal aortic aneurysm) (HCC)    AAA (abdominal aortic aneurysm) without rupture (HCC)    Anemia    Aneurysm ([place])    abd aortic   Anxiety    Atrophic kidney    Cervical radiculopathy    Chronic airway obstruction ([place])    not aware of this   Chronic kidney disease (CKD), stage III (moderate) ([place])    followed by Dr. 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 Lucky Cowboy   Lung cancer [facility])    Lung  cancer ([place])    Meralgia paresthetica    Meralgia paresthetica    Neuralgia    Numbness of right foot 02/19/2021   Osteoarthritis    Osteoarthritis    s/p L knee surgery   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   Presence of permanent cardiac pacemaker    Prostate cancer ([place])    Second degree AV block    Followed by Dr. Tiburcio Bash node dysfunction [facility])    Status post partial lobectomy of lung    bottom right    Stroke [facility])    TIA (transient ischemic attack)     Past Surgical History:  Procedure Laterality Date   CATARACT EXTRACTION     COLONOSCOPY     COLONOSCOPY     COLONOSCOPY WITH PROPOFOL N/A 10/20/2015   Procedure: COLONOSCOPY WITH PROPOFOL;  Surgeon: Manya Silvas, MD;  Location: [facility] ENDOSCOPY;  Service: Endoscopy;  Laterality: N/A;   COLONOSCOPY WITH PROPOFOL N/A 11/28/2020   Procedure: COLONOSCOPY WITH PROPOFOL;  Surgeon: Robert Bellow, MD;  Location: [facility] ENDOSCOPY;  Service: Endoscopy;  Laterality: N/A;   CORONARY ARTERY BYPASS GRAFT  triple   coronary atherosclerosis of autologous vein bypass graft     EMBOLIZATION Right 12/27/2016   Procedure: Embolization;  Surgeon: Algernon Huxley, MD;  Location: [place] CV LAB;  Service: Cardiovascular;  Laterality: Right;   ENDOVASCULAR REPAIR/STENT GRAFT N/A 01/05/2017   Procedure: Endovascular Repair/Stent Graft;  Surgeon: Algernon Huxley, MD;  Location: [place] CV LAB;  Service: Cardiovascular;  Laterality: N/A;   EYE SURGERY Bilateral    cataract extraction   INSERT / REPLACE / REMOVE PACEMAKER     JOINT REPLACEMENT     shoulder and knees   KNEE ARTHROSCOPY     LOBECTOMY  01/31/13   RLL w/squamous cell carcinoma lobectomy   LUNG REMOVAL, PARTIAL  2014   right lower lobe   PACEMAKER INSERTION     PACEMAKER INSERTION  12/2012   Dual chanber pacemaker generator   partial shoulder replacement Right    POLYPECTOMY     PROSTATECTOMY     TOTAL KNEE ARTHROPLASTY Bilateral     TOTAL SHOULDER ARTHROPLASTY Left 07/10/2015   Procedure: TOTAL SHOULDER ARTHROPLASTY;  Surgeon: [name], MD;  Location: [facility] ORS;  Service: Orthopedics;  Laterality: Left;   TOTAL SHOULDER REPLACEMENT      There were no vitals filed for this visit.   Subjective Assessment - 07/01/21 1606     Subjective Pt reports generalized pain today in muscles and joints and rates it 3-4/10. He reports he is still sore from workout the other day.    Pertinent History Pt presents to PT clinic in transport chair and with SPC. He has been to this clinic before for PT d/t back pain, LE weakness and following shoulder surgeries. Pt now presents to PT with c/o decreased activity tolerance, gait ability, and continued decrease in LE strength following back surgery in 03/16/2017 that he states, "went wrong." Pt now has numbness that travels length of LLE. He reports numbness in R foot and ankle. Pt reports his LLE is "worse" than his RLE. Prior to surgery pt ambulated without an AD, but has used a SPC ever since. He reports he was referred to PT d/t fall from his bed 2 months ago. The pt reports he has chronic LBP, but is currently feeling OK d/t taking pain medication prior to PT. He reports his worst back pain reaches a 3-4/10. 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    Currently in Pain? Yes              INTERVENTION THIS DATE:   STS - 2x10; pt reports difficulty toward end of set. SPO2 98%, HR 96-101 bpm. Requires seated rest breaks.   Seated marches with 5# AW on BLEs - 3x16; pt reported some increase in back pain that resolved by third set.  Seated LAQ with  5# AW on BLEs - 3x15 BLES; pt with reduced ROM on LLE d/t reports of nerve pain.  Did show improved ROM of  LLE with reps.   Amb. Approx 3x 170 ft with 4WW, CGA. Rest breaks between sets. Pt HR 85-88 bpm immediately post-exercise. Pt rates medium-hard. Pt reports greater difficulty with LLE compared to RLE. Cuing for B increase in step-length.  Amb. around cones and over 1/2 foam - 4x. No LOB. Rates easy.  Pt takes a few steps, instructed to stop and quickly turn: To R side - 4x. Pt reports he feels he is dragging L foot. To L side - 4x. Pt reports "a lot more difficult." Exhibits small steps.   360 turns - x 3-4 reps each direction. Pt continues to take small steps. Cuing for larger steps.      PT Education - 07/01/21 1653     Education Details exercise technique, body mechanics    Person(s) Educated Patient    Methods Explanation;Demonstration;Tactile cues;Verbal cues    Comprehension Verbalized understanding;Returned demonstration              PT Short Term Goals - 06/22/21 1629       PT SHORT TERM GOAL #1   Title Patient will be independent in home exercise program to improve strength/mobility for better functional independence with ADLs.    Baseline 8/15: initial HEP initiated    Time 6    Period Weeks    Status New    Target Date 08/03/21               PT Long Term Goals - 06/22/21 1630       PT LONG TERM GOAL #1   Title Patient will increase FOTO score to equal to or greater than 52  to demonstrate statistically significant improvement in mobility and quality of life.    Baseline 8/15: 45    Time 12    Period Weeks    Status New    Target Date 09/14/21      PT LONG TERM GOAL #2   Title Pt will decrease TUG to below 14 seconds/decrease in order to demonstrate decreased fall risk.    Baseline 8/15: 17 sec with SPC, close CGA    Time 12    Period Weeks    Status New    Target Date 09/14/21      PT LONG TERM GOAL #3   Title Pt will decrease 5TSTS  by at least 3 seconds in order to demonstrate clinically significant improvement in LE strength.    Baseline 8/15: 15.51 sec without use of BUEs, but does not achieve full ROM with some reps    Time 12    Period Weeks    Status New    Target Date 09/14/21      PT LONG TERM GOAL #4   Title Patient will increase 10 meter walk test to >1.55m/s as to improve gait speed for better community ambulation and to reduce fall risk.    Baseline 8/15: 0.65 m/s with SPC    Time 12    Period Weeks    Status New    Target Date 09/14/21      PT LONG TERM GOAL #5   Title Patient will increase BLE gross strength to 4+/5 as to improve functional strength for independent gait, increased standing tolerance and increased ADL ability.    Baseline 8/15: strength in LEs grossly 4/5 B    Time 12    Period Weeks    Status New    Target Date 09/14/21      Additional Long Term Goals   Additional Long Term Goals Yes      PT LONG TERM GOAL #6   Title Pt will increase 2MWT by at least 15m (174ft) in order to demonstrate an improvement in cardiopulmonary endurance and community ambulation  Baseline 8/15: 296 ft with SPC    Time 12    Period Weeks    Status New    Target Date 09/14/21                   Plan - 07/01/21 1653     Clinical Impression Statement Pt upright activity limited secondary to DOE (not new, pt hx of R lower lobe lung removal). Pt exhibits poor endurance overall, but is highly motivated throughout session. Continued focus on strength and LE and cardioresp endurance next session to improve tolerance for upright activity/ADLs. The pt will benefit from further skilled therapy to improve BLE strength, endurance, gait and balance to increase ease with all functional mobility.    Personal Factors and Comorbidities Age;Comorbidity 1;Comorbidity 2;Comorbidity 3+;Fitness;Time since onset of injury/illness/exacerbation;Past/Current Experience    Comorbidities PMH includes hx of TIA with  residual short term memory dysfunction, B shoulder surgeries, back surgery 03/16/2017,  B TKR, aortic aneurysm, pacemaker, anemia, cervical radiculopathy, chronic airway obstruction, lung CA, lung removal of R lower lobe 2014, prostate CA, chronic kidney disease stage III, chronic pain syndrome, OA, chronic R shoulder pain, chronic tension headaches, CAD, DDD.    Examination-Activity Limitations Bathing;Carry;Lift;Sit;Stand;Bed Mobility;Locomotion Level;Bend;Dressing;Squat;Stairs;Transfers    Examination-Participation Restrictions Medication Management;Cleaning;Meal Prep;Yard Work;Community Activity;Laundry;Shop    Stability/Clinical Decision Making Evolving/Moderate complexity    Rehab Potential Fair    PT Frequency 2x / week    PT Duration 12 weeks    PT Treatment/Interventions ADLs/Self Care Home Management;Aquatic Therapy;Moist Heat;Traction;DME Instruction;Gait training;Stair training;Functional mobility training;Therapeutic activities;Therapeutic exercise;Balance training;Neuromuscular re-education;Patient/family education;Orthotic Fit/Training;Wheelchair mobility training;Manual techniques;Passive range of motion;Dry needling;Energy conservation;Splinting;Taping;Joint Manipulations    PT Next Visit Plan strengthening, gait exercises, further assessment of balance    PT Home Exercise Plan Access Code: [email or web address]    Consulted and Agree with Plan of Care Patient             Patient will benefit from skilled therapeutic intervention in order to improve the following deficits and impairments:  Abnormal gait, Decreased activity tolerance, Decreased endurance, Decreased range of motion, Impaired sensation, Decreased strength, Hypomobility, Improper body mechanics, Pain, Cardiopulmonary status limiting activity, Decreased balance, Decreased coordination, Decreased mobility, Difficulty walking, Increased muscle spasms, Impaired flexibility, Postural dysfunction  Visit Diagnosis: Other abnormalities of  gait and mobility  Muscle weakness (generalized)  Other lack of coordination  Unsteadiness on feet     Problem List Patient Active Problem List   Diagnosis Date Noted   Chronic pain syndrome 06/16/2021   Chronic, continuous use of opioids 06/16/2021   Numbness of right foot 02/19/2021   Aortic atherosclerosis ([place]) 10/15/2020   Rotator cuff tendinitis, right 01/25/2020   Status post right shoulder hemiarthroplasty 01/25/2020   Anemia in chronic kidney disease 11/29/2019   Benign hypertensive kidney disease with chronic kidney disease 11/29/2019   Chronic kidney disease, stage IV (severe) (HCC) 11/29/2019   Hematuria 11/29/2019   Proteinuria 11/29/2019   Secondary hyperparathyroidism of renal origin ([place]) 11/29/2019   Chronic right shoulder pain 11/12/2019   Thoracic aortic aneurysm without rupture ([place]) 10/09/2019   Weakness 06/07/2017   Olecranon fracture, left, closed, initial encounter 05/27/2017   Spondylolisthesis of lumbosacral region 03/16/2017   Low back pain 02/01/2017   Escherichia coli (E. coli) infection 01/28/2017   Acute on chronic renal failure (HCC) 01/28/2017   Elevated troponin 01/28/2017   Generalized weakness 01/28/2017   Essential hypertension 01/28/2017   UTI (urinary tract infection) 01/26/2017   AAA (abdominal aortic aneurysm) without rupture ([place])  11/23/2016   Chronic obstructive pulmonary disease ([place]) 10/09/2015   Personal history of diseases of skin or subcutaneous tissue 10/09/2015   Malignant neoplasm of prostate ([place]) 10/09/2015   Pure hypercholesterolemia 10/09/2015   Sinoatrial node dysfunction ([place]) 10/09/2015   History of surgical procedure 08/22/2015   Status post total shoulder replacement 07/10/2015   Arthritis of shoulder region, degenerative 06/10/2015   Benign essential HTN 06/03/2015   Absolute anemia 04/12/2015   Atrophic kidney 04/12/2015   Essential (primary) hypertension 04/12/2015   Acid reflux 04/12/2015   Cancer of  lung ([place]) 04/12/2015   CA of prostate ([place]) 04/12/2015   H/O adenomatous polyp of colon 01/15/2015   Temporary cerebral vascular dysfunction 11/21/2014   Calcific shoulder tendinitis 08/01/2014   Cervical nerve root disorder 04/07/2012   Ricard Dillon PT, DPT 07/01/2021, 4:57 PM  [place] MAIN [facility] SERVICES [address] [place], [place], [address] Phone: [phone]   Fax:  [phone]  Name: Lavontae Cornia. MRN: [id number] Date of Birth: 1940/11/19

## 2021-07-06 ENCOUNTER — Encounter: Payer: Self-pay | Admitting: Physical Therapy

## 2021-07-06 ENCOUNTER — Other Ambulatory Visit: Payer: Self-pay

## 2021-07-06 ENCOUNTER — Ambulatory Visit: Payer: PPO | Admitting: Physical Therapy

## 2021-07-06 DIAGNOSIS — M6281 Muscle weakness (generalized): Secondary | ICD-10-CM | POA: Diagnosis not present

## 2021-07-06 DIAGNOSIS — R2681 Unsteadiness on feet: Secondary | ICD-10-CM

## 2021-07-06 DIAGNOSIS — R278 Other lack of coordination: Secondary | ICD-10-CM

## 2021-07-06 DIAGNOSIS — R2689 Other abnormalities of gait and mobility: Secondary | ICD-10-CM

## 2021-07-07 NOTE — Therapy (Signed)
[place] MAIN [facility] SERVICES [address] [place], [place], [address] Phone: [phone]   Fax:  [phone]  Physical Therapy Treatment  Patient Details  Name: Rick Mcbride. MRN: [id number] Date of Birth: 1940-12-12 Referring Provider (PT): Vladimir Crofts, MD   Encounter Date: 07/06/2021   PT End of Session - 07/06/21 1619     Visit Number 5    Number of Visits 25    Date for PT Re-Evaluation 09/14/21    Authorization Type [employer]    Authorization Time Period 06/22/21-09/14/21    PT Start Time 1602    PT Stop Time 1645    PT Time Calculation (min) 43 min    Equipment Utilized During Treatment Gait belt    Activity Tolerance Patient tolerated treatment well;Patient limited by fatigue    Behavior During Therapy WFL for tasks assessed/performed             Past Medical History:  Diagnosis Date   AAA (abdominal aortic aneurysm) (HCC)    AAA (abdominal aortic aneurysm) without rupture (HCC)    Anemia    Aneurysm ([place])    abd aortic   Anxiety    Atrophic kidney    Cervical radiculopathy    Chronic airway obstruction ([place])    not aware of this   Chronic kidney disease (CKD), stage III (moderate) ([place])    followed by Dr. 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 Lucky Cowboy   Lung cancer [facility])    Lung  cancer ([place])    Meralgia paresthetica    Meralgia paresthetica    Neuralgia    Numbness of right foot 02/19/2021   Osteoarthritis    Osteoarthritis    s/p L knee surgery   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   Presence of permanent cardiac pacemaker    Prostate cancer ([place])    Second degree AV block    Followed by Dr. Tiburcio Bash node dysfunction [facility])    Status post partial lobectomy of lung    bottom right    Stroke [facility])    TIA (transient ischemic attack)     Past Surgical History:  Procedure Laterality Date   CATARACT EXTRACTION     COLONOSCOPY     COLONOSCOPY     COLONOSCOPY WITH PROPOFOL N/A 10/20/2015   Procedure: COLONOSCOPY WITH PROPOFOL;  Surgeon: Manya Silvas, MD;  Location: [facility] ENDOSCOPY;  Service: Endoscopy;  Laterality: N/A;   COLONOSCOPY WITH PROPOFOL N/A 11/28/2020   Procedure: COLONOSCOPY WITH PROPOFOL;  Surgeon: Robert Bellow, MD;  Location: [facility] ENDOSCOPY;  Service: Endoscopy;  Laterality: N/A;   CORONARY ARTERY BYPASS GRAFT  triple   coronary atherosclerosis of autologous vein bypass graft     EMBOLIZATION Right 12/27/2016   Procedure: Embolization;  Surgeon: Algernon Huxley, MD;  Location: [place] CV LAB;  Service: Cardiovascular;  Laterality: Right;   ENDOVASCULAR REPAIR/STENT GRAFT N/A 01/05/2017   Procedure: Endovascular Repair/Stent Graft;  Surgeon: Algernon Huxley, MD;  Location: [place] CV LAB;  Service: Cardiovascular;  Laterality: N/A;   EYE SURGERY Bilateral    cataract extraction   INSERT / REPLACE / REMOVE PACEMAKER     JOINT REPLACEMENT     shoulder and knees   KNEE ARTHROSCOPY     LOBECTOMY  01/31/13   RLL w/squamous cell carcinoma lobectomy   LUNG REMOVAL, PARTIAL  2014   right lower lobe   PACEMAKER INSERTION     PACEMAKER INSERTION  12/2012   Dual chanber pacemaker generator   partial shoulder replacement Right    POLYPECTOMY     PROSTATECTOMY     TOTAL KNEE ARTHROPLASTY Bilateral     TOTAL SHOULDER ARTHROPLASTY Left 07/10/2015   Procedure: TOTAL SHOULDER ARTHROPLASTY;  Surgeon: [name], MD;  Location: [facility] ORS;  Service: Orthopedics;  Laterality: Left;   TOTAL SHOULDER REPLACEMENT      There were no vitals filed for this visit.   Subjective Assessment - 07/06/21 1611     Subjective 3-4/10 pain mainly in the back. reports soreness following the workouts and inability to compelte HEP as a result.    Pertinent History Pt presents to PT clinic in transport chair and with SPC. He has been to this clinic before for PT d/t back pain, LE weakness and following shoulder surgeries. Pt now presents to PT with c/o decreased activity tolerance, gait ability, and continued decrease in LE strength following back surgery in 03/16/2017 that he states, "went wrong." Pt now has numbness that travels length of LLE. He reports numbness in R foot and ankle. Pt reports his LLE is "worse" than his RLE. Prior to surgery pt ambulated without an AD, but has used a SPC ever since. He reports he was referred to PT d/t fall from his bed 2 months ago. The pt reports he has chronic LBP, but is currently feeling OK d/t taking pain medication prior to PT. He reports his worst back pain reaches a 3-4/10. 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    Limitations Sitting;Walking;Standing;Lifting;House hold activities    How long can you sit comfortably? Pt reports he can sit comfortably for only a few minutes if he has not taken pain medication    How long can you stand comfortably? <10 minutes    How long can you walk comfortably? 10 minutes    Diagnostic tests No  recent imaging    Patient Stated Goals Pt would like to get stronger    Currently in Pain? Yes    Pain Score 4     Pain Location Back    Pain Orientation Posterior;Mid;Lower    Pain Descriptors / Indicators Aching            Treatment this date:  Therex:   Walking with SPC in hand - 160 feet x 2  Amb with SPC  in hand but not utilizing, short stride lengths to start, able to lengthen stride with cues and gait pattern improved significantly.   STS - 2 x 10 with use of hands on knees from standard height chair. Audible but reportedly painless crunching sensation heard in the knee joint with this activity. Pt also required prolonged rest following this exercise in order to catch his breath.  Marches - 2 x 10  LAQ 2 x 10 3#,   Neuro Re-ed  airex step ups 5 x with 2 HH A and CGA -5 x with 1 HH A and CGA  6 walk throughs x 3 hedgehog weaving, pt had difficulty with foot placement with obstacles as he had to step around them. No HH assistance or LOB noted  8 x hedgehog taps anteriorlly and laterally, pt reports most difficulty with lateral hedgehog taps , pt utilized 1 HH A in order to perform exercise safely, reports he could not complete exercise without his hand for support.                            PT Education - 07/06/21 1619     Education Details walking mechanics and when to utilize cane    Person(s) Educated Patient    Methods Explanation    Comprehension Verbalized understanding              PT Short Term Goals - 06/22/21 1629       PT SHORT TERM GOAL #1   Title Patient will be independent in home exercise program to improve strength/mobility for better functional independence with ADLs.    Baseline 8/15: initial HEP initiated    Time 6    Period Weeks    Status New    Target Date 08/03/21               PT Long Term Goals - 06/22/21 1630       PT LONG TERM GOAL #1   Title Patient will increase FOTO score to equal to or  greater than 52  to demonstrate statistically significant improvement in mobility and quality of life.    Baseline 8/15: 45    Time 12    Period Weeks    Status New    Target Date 09/14/21      PT LONG TERM GOAL #2   Title Pt will decrease TUG to below 14 seconds/decrease in order to demonstrate decreased fall risk.    Baseline 8/15: 17 sec with SPC, close CGA    Time 12    Period Weeks    Status New    Target Date 09/14/21      PT LONG TERM GOAL #3   Title Pt will decrease 5TSTS by at least 3 seconds in order to demonstrate clinically significant improvement in LE strength.    Baseline 8/15: 15.51 sec without use of BUEs, but does not achieve full ROM with some reps    Time 12    Period Weeks    Status New    Target Date 09/14/21      PT LONG TERM GOAL #4   Title Patient will increase 10 meter walk test to >1.34m/s as to improve gait speed for better community ambulation and to reduce fall risk.    Baseline 8/15: 0.65 m/s with SPC    Time 12    Period Weeks    Status New    Target Date 09/14/21  PT LONG TERM GOAL #5   Title Patient will increase BLE gross strength to 4+/5 as to improve functional strength for independent gait, increased standing tolerance and increased ADL ability.    Baseline 8/15: strength in LEs grossly 4/5 B    Time 12    Period Weeks    Status New    Target Date 09/14/21      Additional Long Term Goals   Additional Long Term Goals Yes      PT LONG TERM GOAL #6   Title Pt will increase 2MWT by at least 40m (1106ft) in order to demonstrate an improvement in cardiopulmonary endurance and community ambulation    Baseline 8/15: 296 ft with [facility]    Time 12    Period Weeks    Status New    Target Date 09/14/21                   Plan - 07/06/21 1620     Clinical Impression Statement Continued with current plan of care as laid out in evaluation and recent prior sessions. Pt remains motivated to advance progress toward goals in order to  maximize independence and safety at home. Pt requires high level assistance and cuing for completion of exercises in order to provide adequate level of stimulation and perturbation. Author allows pt as much opportunity as possible to perform independent righting strategies, only stepping in when pt is unable to prevent falling to floor. Pt closely monitored throughout session for safe vitals response and to maximize patient safety during interventions. Pt frequently required rest breaks due to increased RR secondary to partial lung removal surgery in past. Pt continues to demonstrate progress toward goals AEB progression of some interventions this date either in volume or intensity.  Pt and wife did make comment regarding concerns with potential for progress with therapy at end of session. Pt was encouraged to continue with POC  and PT takes time to see meaningful effects.    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    Examination-Activity Limitations Bathing;Carry;Lift;Sit;Stand;Bed Mobility;Locomotion Level;Bend;Dressing;Squat;Stairs;Transfers    Examination-Participation Restrictions Medication Management;Cleaning;Meal Prep;Yard Work;Community Activity;Laundry;Shop    Stability/Clinical Decision Making Evolving/Moderate complexity    Clinical Decision Making Moderate    Rehab Potential Fair    PT Frequency 2x / week    PT Duration 12 weeks    PT Treatment/Interventions ADLs/Self Care  Home Management;Aquatic Therapy;Moist Heat;Traction;DME Instruction;Gait training;Stair training;Functional mobility training;Therapeutic activities;Therapeutic exercise;Balance training;Neuromuscular re-education;Patient/family education;Orthotic Fit/Training;Wheelchair mobility training;Manual techniques;Passive range of motion;Dry needling;Energy conservation;Splinting;Taping;Joint Manipulations    PT Next Visit Plan strengthening, gait exercises, further assessment of balance    PT Home Exercise Plan Access Code: [email or web address]    Consulted and Agree with Plan of Care Patient             Patient will benefit from skilled therapeutic intervention in order to improve the following deficits and impairments:  Abnormal gait, Decreased activity tolerance, Decreased endurance, Decreased range of motion, Impaired sensation, Decreased strength, Hypomobility, Improper body mechanics, Pain, Cardiopulmonary status limiting activity, Decreased balance, Decreased coordination, Decreased mobility, Difficulty walking, Increased muscle  spasms, Impaired flexibility, Postural dysfunction  Visit Diagnosis: Other abnormalities of gait and mobility  Muscle weakness (generalized)  Other lack of coordination  Unsteadiness on feet     Problem List Patient Active Problem List   Diagnosis Date Noted   Chronic pain syndrome 06/16/2021   Chronic, continuous use of opioids 06/16/2021   Numbness of right foot 02/19/2021   Aortic atherosclerosis ([place]) 10/15/2020   Rotator cuff tendinitis, right 01/25/2020   Status post right shoulder hemiarthroplasty 01/25/2020   Anemia in chronic kidney disease 11/29/2019   Benign hypertensive kidney disease with chronic kidney disease 11/29/2019   Chronic kidney disease, stage IV (severe) ([place]) 11/29/2019   Hematuria 11/29/2019   Proteinuria 11/29/2019   Secondary hyperparathyroidism of renal origin ([place]) 11/29/2019   Chronic right shoulder pain 11/12/2019   Thoracic aortic  aneurysm without rupture ([place]) 10/09/2019   Weakness 06/07/2017   Olecranon fracture, left, closed, initial encounter 05/27/2017   Spondylolisthesis of lumbosacral region 03/16/2017   Low back pain 02/01/2017   Escherichia coli (E. coli) infection 01/28/2017   Acute on chronic renal failure (HCC) 01/28/2017   Elevated troponin 01/28/2017   Generalized weakness 01/28/2017   Essential hypertension 01/28/2017   UTI (urinary tract infection) 01/26/2017   AAA (abdominal aortic aneurysm) without rupture ([place]) 11/23/2016   Chronic obstructive pulmonary disease ([place]) 10/09/2015   Personal history of diseases of skin or subcutaneous tissue 10/09/2015   Malignant neoplasm of prostate ([place]) 10/09/2015   Pure hypercholesterolemia 10/09/2015   Sinoatrial node dysfunction ([place]) 10/09/2015   History of surgical procedure 08/22/2015   Status post total shoulder replacement 07/10/2015   Arthritis of shoulder region, degenerative 06/10/2015   Benign essential HTN 06/03/2015   Absolute anemia 04/12/2015   Atrophic kidney 04/12/2015   Essential (primary) hypertension 04/12/2015   Acid reflux 04/12/2015   Cancer of lung ([place]) 04/12/2015   CA of prostate ([place]) 04/12/2015   H/O adenomatous polyp of colon 01/15/2015   Temporary cerebral vascular dysfunction 11/21/2014   Calcific shoulder tendinitis 08/01/2014   Cervical nerve root disorder 04/07/2012   Rivka Barbara PT, DPT   Particia Lather 07/07/2021, 8:03 AM  [place] MAIN [facility] SERVICES [address] [place], [place], [address] Phone: [phone]   Fax:  [phone]  Name: Rick Mcbride. MRN: [id number] Date of Birth: 07/02/1941

## 2021-07-08 ENCOUNTER — Ambulatory Visit: Payer: PPO

## 2021-07-08 ENCOUNTER — Other Ambulatory Visit: Payer: Self-pay

## 2021-07-08 DIAGNOSIS — I10 Essential (primary) hypertension: Secondary | ICD-10-CM | POA: Diagnosis not present

## 2021-07-08 DIAGNOSIS — N1832 Chronic kidney disease, stage 3b: Secondary | ICD-10-CM | POA: Diagnosis not present

## 2021-07-08 DIAGNOSIS — R278 Other lack of coordination: Secondary | ICD-10-CM

## 2021-07-08 DIAGNOSIS — R2689 Other abnormalities of gait and mobility: Secondary | ICD-10-CM

## 2021-07-08 DIAGNOSIS — R739 Hyperglycemia, unspecified: Secondary | ICD-10-CM | POA: Diagnosis not present

## 2021-07-08 DIAGNOSIS — F5104 Psychophysiologic insomnia: Secondary | ICD-10-CM | POA: Diagnosis not present

## 2021-07-08 DIAGNOSIS — E782 Mixed hyperlipidemia: Secondary | ICD-10-CM | POA: Diagnosis not present

## 2021-07-08 DIAGNOSIS — R2681 Unsteadiness on feet: Secondary | ICD-10-CM

## 2021-07-08 DIAGNOSIS — M6281 Muscle weakness (generalized): Secondary | ICD-10-CM

## 2021-07-08 DIAGNOSIS — Z79899 Other long term (current) drug therapy: Secondary | ICD-10-CM | POA: Diagnosis not present

## 2021-07-08 DIAGNOSIS — I712 Thoracic aortic aneurysm, without rupture: Secondary | ICD-10-CM | POA: Diagnosis not present

## 2021-07-08 DIAGNOSIS — M5137 Other intervertebral disc degeneration, lumbosacral region: Secondary | ICD-10-CM | POA: Diagnosis not present

## 2021-07-08 NOTE — Therapy (Signed)
[place] MAIN [facility] SERVICES [address] [place], [place], [address] Phone: [phone]   Fax:  [phone]  Physical Therapy Treatment  Patient Details  Name: Rick Mcbride. MRN: [id number] Date of Birth: 1941/01/06 Referring Provider (PT): Vladimir Crofts, MD   Encounter Date: 07/08/2021   PT End of Session - 07/08/21 1526     Visit Number 6    Number of Visits 25    Date for PT Re-Evaluation 09/14/21    Authorization Type [employer]    Authorization Time Period 06/22/21-09/14/21    PT Start Time 0933    PT Stop Time 1015    PT Time Calculation (min) 42 min    Equipment Utilized During Treatment Gait belt    Activity Tolerance Patient tolerated treatment well;Patient limited by fatigue    Behavior During Therapy WFL for tasks assessed/performed             Past Medical History:  Diagnosis Date   AAA (abdominal aortic aneurysm) (HCC)    AAA (abdominal aortic aneurysm) without rupture (HCC)    Anemia    Aneurysm ([place])    abd aortic   Anxiety    Atrophic kidney    Cervical radiculopathy    Chronic airway obstruction ([place])    not aware of this   Chronic kidney disease (CKD), stage III (moderate) ([place])    followed by Dr. 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 Lucky Cowboy   Lung cancer [facility])    Lung  cancer ([place])    Meralgia paresthetica    Meralgia paresthetica    Neuralgia    Numbness of right foot 02/19/2021   Osteoarthritis    Osteoarthritis    s/p L knee surgery   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   Presence of permanent cardiac pacemaker    Prostate cancer ([place])    Second degree AV block    Followed by Dr. Tiburcio Bash node dysfunction [facility])    Status post partial lobectomy of lung    bottom right    Stroke [facility])    TIA (transient ischemic attack)     Past Surgical History:  Procedure Laterality Date   CATARACT EXTRACTION     COLONOSCOPY     COLONOSCOPY     COLONOSCOPY WITH PROPOFOL N/A 10/20/2015   Procedure: COLONOSCOPY WITH PROPOFOL;  Surgeon: Manya Silvas, MD;  Location: [facility] ENDOSCOPY;  Service: Endoscopy;  Laterality: N/A;   COLONOSCOPY WITH PROPOFOL N/A 11/28/2020   Procedure: COLONOSCOPY WITH PROPOFOL;  Surgeon: Robert Bellow, MD;  Location: [facility] ENDOSCOPY;  Service: Endoscopy;  Laterality: N/A;   CORONARY ARTERY BYPASS GRAFT  triple   coronary atherosclerosis of autologous vein bypass graft     EMBOLIZATION Right 12/27/2016   Procedure: Embolization;  Surgeon: Algernon Huxley, MD;  Location: [place] CV LAB;  Service: Cardiovascular;  Laterality: Right;   ENDOVASCULAR REPAIR/STENT GRAFT N/A 01/05/2017   Procedure: Endovascular Repair/Stent Graft;  Surgeon: Algernon Huxley, MD;  Location: [place] CV LAB;  Service: Cardiovascular;  Laterality: N/A;   EYE SURGERY Bilateral    cataract extraction   INSERT / REPLACE / REMOVE PACEMAKER     JOINT REPLACEMENT     shoulder and knees   KNEE ARTHROSCOPY     LOBECTOMY  01/31/13   RLL w/squamous cell carcinoma lobectomy   LUNG REMOVAL, PARTIAL  2014   right lower lobe   PACEMAKER INSERTION     PACEMAKER INSERTION  12/2012   Dual chanber pacemaker generator   partial shoulder replacement Right    POLYPECTOMY     PROSTATECTOMY     TOTAL KNEE ARTHROPLASTY Bilateral     TOTAL SHOULDER ARTHROPLASTY Left 07/10/2015   Procedure: TOTAL SHOULDER ARTHROPLASTY;  Surgeon: [name], MD;  Location: [facility] ORS;  Service: Orthopedics;  Laterality: Left;   TOTAL SHOULDER REPLACEMENT      There were no vitals filed for this visit.   Subjective Assessment - 07/08/21 0927     Subjective Pt reports usual aches and pains. Pt reports he has not been able to do HEP. Pt reports he has walked around his home a bit.    Pertinent History Pt presents to PT clinic in transport chair and with SPC. He has been to this clinic before for PT d/t back pain, LE weakness and following shoulder surgeries. Pt now presents to PT with c/o decreased activity tolerance, gait ability, and continued decrease in LE strength following back surgery in 03/16/2017 that he states, "went wrong." Pt now has numbness that travels length of LLE. He reports numbness in R foot and ankle. Pt reports his LLE is "worse" than his RLE. Prior to surgery pt ambulated without an AD, but has used a SPC ever since. He reports he was referred to PT d/t fall from his bed 2 months ago. The pt reports he has chronic LBP, but is currently feeling OK d/t taking pain medication prior to PT. He reports his worst back pain reaches a 3-4/10. 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    Limitations Sitting;Walking;Standing;Lifting;House hold activities    How long can you sit comfortably? Pt reports he can sit comfortably for only a few minutes if he has not taken pain medication    How long can you stand comfortably? <10 minutes    How long can you walk comfortably? 10 minutes    Diagnostic  tests No recent imaging    Patient Stated Goals Pt would like to get stronger    Currently in Pain? Yes            INTERVENTIONS  Therex:   Nustep level 2 x 6 min for LE mm endurance and cardioresp endurance, SPM >80s; pt rates medium. Supervision throughout. Min assist mount/dismount.   Walking with SPC in hand over 10 meter track 6-8 reps  for 2 rounds. RPE reaches 4-6/10. HR 80s-90s bpm. Cuing for B step-length. Pt with WBOS.   STS 8x  STS 3x3-4 with LLE as primary stance leg (RLE placed ahead), and 4x with 4 sec eccentric and R foot on airex pad. Pt uses UEs to assist with concentric. Rates medium.   LAQ 1x12 with 4lb AW on R and 3lb AW on LLE; Pt reports L lateral and anterior hip pain. Unclear per report if pt feels in his joint or in muscle. Pt reports nature is sharp but also reports it is not sharp. Pt reports no pain at rest.   BTB seated hip abduction 15x, 20x, no pain  Standing hip abduction 12x with LLE as stance leg, 15x RLE as stance leg. Discontinued d/t reports of L hip pain.     PT Education - 07/08/21 1526     Education Details exercise technique, body mechanics    Person(s) Educated Patient    Methods Explanation;Demonstration;Verbal cues;Tactile cues    Comprehension Verbalized understanding;Returned demonstration              PT Short Term Goals - 06/22/21 1629       PT SHORT TERM GOAL #1   Title Patient will be independent in home exercise program to improve strength/mobility for better functional independence with ADLs.    Baseline 8/15: initial HEP initiated    Time 6    Period Weeks    Status New    Target Date 08/03/21               PT Long Term Goals - 06/22/21 1630       PT LONG TERM GOAL #1   Title Patient will increase FOTO score to equal to or greater than 52  to demonstrate statistically significant improvement in mobility and quality of life.    Baseline 8/15: 45    Time 12    Period Weeks    Status New    Target  Date 09/14/21      PT LONG TERM GOAL #2   Title Pt will decrease TUG to below 14 seconds/decrease in order to demonstrate decreased fall risk.    Baseline 8/15: 17 sec with SPC, close CGA    Time 12    Period Weeks    Status New    Target Date 09/14/21      PT LONG TERM GOAL #3   Title Pt will decrease 5TSTS by at least 3 seconds in order to demonstrate clinically significant improvement in LE strength.    Baseline 8/15: 15.51 sec without use of BUEs, but does not achieve full ROM with some reps    Time 12    Period Weeks    Status New    Target Date 09/14/21      PT LONG TERM GOAL #4   Title Patient will increase 10 meter walk test to >1.12m/s as to improve gait speed for better community ambulation and to reduce fall risk.    Baseline 8/15: 0.65 m/s with SPC    Time 12    Period Weeks    Status New    Target Date 09/14/21      PT LONG TERM GOAL #5   Title Patient will increase BLE gross strength to 4+/5 as to improve functional strength for independent gait, increased standing tolerance and increased ADL ability.    Baseline 8/15: strength in LEs grossly 4/5 B    Time 12    Period Weeks  Status New    Target Date 09/14/21      Additional Long Term Goals   Additional Long Term Goals Yes      PT LONG TERM GOAL #6   Title Pt will increase 2MWT by at least 9m (134ft) in order to demonstrate an improvement in cardiopulmonary endurance and community ambulation    Baseline 8/15: 296 ft with [facility]    Time 12    Period Weeks    Status New    Target Date 09/14/21                   Plan - 07/08/21 1532     Clinical Impression Statement Continued endurance training this session. Pt requires frequent rest breaks secondary to fatigue. He was able to tolerate a more advanced version of STS training that emphasized LLE strengthening. Other therex was somewhat limited secondary to reports of L hip pain and pain around L lateral quad. Pt reports pain is and isn't sharp in  nature. Exercises that aggravated sx were discontinued and his pain resolved with rest. Further assessment of L hip/LLE should be completed future session if pt still with reports of pain. The pt will benefit from further skilled PT to continue to improve strength, endurance, gait and balance to increase QOL and decrease fall risk.    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    Examination-Activity Limitations Bathing;Carry;Lift;Sit;Stand;Bed Mobility;Locomotion Level;Bend;Dressing;Squat;Stairs;Transfers    Examination-Participation Restrictions Medication Management;Cleaning;Meal Prep;Yard Work;Community Activity;Laundry;Shop    Stability/Clinical Decision Making Evolving/Moderate complexity    Rehab Potential Fair    PT Frequency 2x / week    PT Duration 12 weeks    PT Treatment/Interventions ADLs/Self Care Home Management;Aquatic Therapy;Moist Heat;Traction;DME Instruction;Gait training;Stair training;Functional mobility training;Therapeutic activities;Therapeutic exercise;Balance training;Neuromuscular re-education;Patient/family education;Orthotic Fit/Training;Wheelchair mobility training;Manual techniques;Passive range of motion;Dry needling;Energy conservation;Splinting;Taping;Joint Manipulations    PT Next Visit Plan strengthening, gait exercises, further assessment of balance    PT Home Exercise Plan Access Code: [email or web address]    Consulted and Agree with Plan of Care Patient              Patient will benefit from skilled therapeutic intervention in order to improve the following deficits and impairments:  Abnormal gait, Decreased activity tolerance, Decreased endurance, Decreased range of motion, Impaired sensation, Decreased strength, Hypomobility, Improper body mechanics, Pain, Cardiopulmonary status limiting activity, Decreased balance, Decreased coordination, Decreased mobility, Difficulty walking, Increased muscle spasms, Impaired flexibility, Postural dysfunction  Visit Diagnosis: Muscle weakness (generalized)  Other abnormalities of gait and mobility  Unsteadiness on feet  Other lack of coordination     Problem List Patient Active Problem List   Diagnosis Date Noted   Chronic pain syndrome 06/16/2021   Chronic, continuous use of opioids 06/16/2021   Numbness of right foot 02/19/2021   Aortic atherosclerosis ([place]) 10/15/2020   Rotator cuff tendinitis, right 01/25/2020   Status post right shoulder hemiarthroplasty 01/25/2020   Anemia in chronic kidney disease 11/29/2019   Benign hypertensive kidney disease with chronic kidney disease 11/29/2019   Chronic kidney disease, stage IV (severe) ([place]) 11/29/2019   Hematuria 11/29/2019  Proteinuria 11/29/2019   Secondary hyperparathyroidism of renal origin ([place]) 11/29/2019   Chronic right shoulder pain 11/12/2019   Thoracic aortic aneurysm without rupture ([place]) 10/09/2019   Weakness 06/07/2017   Olecranon fracture, left, closed, initial encounter 05/27/2017   Spondylolisthesis of lumbosacral region 03/16/2017   Low back pain 02/01/2017   Escherichia coli (E. coli) infection 01/28/2017   Acute on chronic renal failure (HCC) 01/28/2017   Elevated troponin 01/28/2017   Generalized weakness 01/28/2017   Essential hypertension 01/28/2017   UTI (urinary tract infection) 01/26/2017   AAA (abdominal aortic aneurysm) without rupture ([place]) 11/23/2016   Chronic obstructive pulmonary disease ([place]) 10/09/2015    Personal history of diseases of skin or subcutaneous tissue 10/09/2015   Malignant neoplasm of prostate ([place]) 10/09/2015   Pure hypercholesterolemia 10/09/2015   Sinoatrial node dysfunction ([place]) 10/09/2015   History of surgical procedure 08/22/2015   Status post total shoulder replacement 07/10/2015   Arthritis of shoulder region, degenerative 06/10/2015   Benign essential HTN 06/03/2015   Absolute anemia 04/12/2015   Atrophic kidney 04/12/2015   Essential (primary) hypertension 04/12/2015   Acid reflux 04/12/2015   Cancer of lung ([place]) 04/12/2015   CA of prostate ([place]) 04/12/2015   H/O adenomatous polyp of colon 01/15/2015   Temporary cerebral vascular dysfunction 11/21/2014   Calcific shoulder tendinitis 08/01/2014   Cervical nerve root disorder 04/07/2012   Ricard Dillon PT, DPT 07/08/2021, 3:37 PM  [place] MAIN [facility] SERVICES [address] [place], [place], [address] Phone: [phone]   Fax:  [phone]  Name: Rick Mcbride. MRN: [id number] Date of Birth: [birth year]

## 2021-07-10 ENCOUNTER — Other Ambulatory Visit: Payer: Self-pay

## 2021-07-10 ENCOUNTER — Ambulatory Visit
Admission: RE | Admit: 2021-07-10 | Discharge: 2021-07-10 | Disposition: A | Discharge status: Home / Self Care | Payer: PPO | Source: Ambulatory Visit | Attending: Unknown Physician Specialty | Admitting: Unknown Physician Specialty

## 2021-07-10 ENCOUNTER — Other Ambulatory Visit: Payer: Self-pay | Admitting: Unknown Physician Specialty

## 2021-07-10 DIAGNOSIS — J392 Other diseases of pharynx: Secondary | ICD-10-CM | POA: Diagnosis not present

## 2021-07-10 DIAGNOSIS — D3702 Neoplasm of uncertain behavior of tongue: Secondary | ICD-10-CM

## 2021-07-10 DIAGNOSIS — M47812 Spondylosis without myelopathy or radiculopathy, cervical region: Secondary | ICD-10-CM | POA: Diagnosis not present

## 2021-07-10 LAB — POCT I-STAT CREATININE: Creatinine, Ser: 2.5 mg/dL — ABNORMAL HIGH (ref 0.61–1.24)

## 2021-07-15 ENCOUNTER — Ambulatory Visit: Payer: PPO

## 2021-07-15 DIAGNOSIS — Z8546 Personal history of malignant neoplasm of prostate: Secondary | ICD-10-CM | POA: Diagnosis not present

## 2021-07-16 ENCOUNTER — Other Ambulatory Visit: Payer: Self-pay

## 2021-07-16 ENCOUNTER — Ambulatory Visit: Payer: PPO | Attending: Neurology

## 2021-07-16 DIAGNOSIS — R269 Unspecified abnormalities of gait and mobility: Secondary | ICD-10-CM | POA: Insufficient documentation

## 2021-07-16 DIAGNOSIS — M6281 Muscle weakness (generalized): Secondary | ICD-10-CM | POA: Diagnosis not present

## 2021-07-16 DIAGNOSIS — G3184 Mild cognitive impairment, so stated: Secondary | ICD-10-CM | POA: Diagnosis not present

## 2021-07-16 DIAGNOSIS — R2681 Unsteadiness on feet: Secondary | ICD-10-CM | POA: Diagnosis not present

## 2021-07-16 DIAGNOSIS — G629 Polyneuropathy, unspecified: Secondary | ICD-10-CM | POA: Diagnosis not present

## 2021-07-16 DIAGNOSIS — R2689 Other abnormalities of gait and mobility: Secondary | ICD-10-CM | POA: Diagnosis not present

## 2021-07-16 DIAGNOSIS — Z8673 Personal history of transient ischemic attack (TIA), and cerebral infarction without residual deficits: Secondary | ICD-10-CM | POA: Diagnosis not present

## 2021-07-16 DIAGNOSIS — R262 Difficulty in walking, not elsewhere classified: Secondary | ICD-10-CM | POA: Insufficient documentation

## 2021-07-16 NOTE — Therapy (Signed)
[place] MAIN [facility] SERVICES [address] [place], [place], [address] Phone: [phone]   Fax:  [phone]  Physical Therapy Treatment  Patient Details  Name: Rick Mcbride. MRN: [id number] Date of Birth: May 06, 1941 Referring Provider (PT): Vladimir Crofts, MD   Encounter Date: 07/16/2021   PT End of Session - 07/16/21 0940     Visit Number 7    Number of Visits 25    Date for PT Re-Evaluation 09/14/21    Authorization Type [employer]    Authorization Time Period 06/22/21-09/14/21    PT Start Time 0802    PT Stop Time 0845    PT Time Calculation (min) 43 min    Equipment Utilized During Treatment Gait belt    Activity Tolerance Patient tolerated treatment well;Patient limited by fatigue    Behavior During Therapy WFL for tasks assessed/performed             Past Medical History:  Diagnosis Date   AAA (abdominal aortic aneurysm) (HCC)    AAA (abdominal aortic aneurysm) without rupture (HCC)    Anemia    Aneurysm (HCC)    abd aortic   Anxiety    Atrophic kidney    Cervical radiculopathy    Chronic airway obstruction ([place])    not aware of this   Chronic kidney disease (CKD), stage III (moderate) ([place])    followed by Dr. 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 Lucky Cowboy   Lung cancer [facility])    Lung  cancer ([place])    Meralgia paresthetica    Meralgia paresthetica    Neuralgia    Numbness of right foot 02/19/2021   Osteoarthritis    Osteoarthritis    s/p L knee surgery   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   Presence of permanent cardiac pacemaker    Prostate cancer ([place])    Second degree AV block    Followed by Dr. Tiburcio Bash node dysfunction [facility])    Status post partial lobectomy of lung    bottom right    Stroke [facility])    TIA (transient ischemic attack)     Past Surgical History:  Procedure Laterality Date   CATARACT EXTRACTION     COLONOSCOPY     COLONOSCOPY     COLONOSCOPY WITH PROPOFOL N/A 10/20/2015   Procedure: COLONOSCOPY WITH PROPOFOL;  Surgeon: Manya Silvas, MD;  Location: [facility] ENDOSCOPY;  Service: Endoscopy;  Laterality: N/A;   COLONOSCOPY WITH PROPOFOL N/A 11/28/2020   Procedure: COLONOSCOPY WITH PROPOFOL;  Surgeon: Robert Bellow, MD;  Location: [facility] ENDOSCOPY;  Service: Endoscopy;  Laterality: N/A;   CORONARY ARTERY BYPASS GRAFT  triple   coronary atherosclerosis of autologous vein bypass graft     EMBOLIZATION Right 12/27/2016   Procedure: Embolization;  Surgeon: Algernon Huxley, MD;  Location: [place] CV LAB;  Service: Cardiovascular;  Laterality: Right;   ENDOVASCULAR REPAIR/STENT GRAFT N/A 01/05/2017   Procedure: Endovascular Repair/Stent Graft;  Surgeon: Algernon Huxley, MD;  Location: [place] CV LAB;  Service: Cardiovascular;  Laterality: N/A;   EYE SURGERY Bilateral    cataract extraction   INSERT / REPLACE / REMOVE PACEMAKER     JOINT REPLACEMENT     shoulder and knees   KNEE ARTHROSCOPY     LOBECTOMY  01/31/13   RLL w/squamous cell carcinoma lobectomy   LUNG REMOVAL, PARTIAL  2014   right lower lobe   PACEMAKER INSERTION     PACEMAKER INSERTION  12/2012   Dual chanber pacemaker generator   partial shoulder replacement Right    POLYPECTOMY     PROSTATECTOMY     TOTAL KNEE ARTHROPLASTY Bilateral     TOTAL SHOULDER ARTHROPLASTY Left 07/10/2015   Procedure: TOTAL SHOULDER ARTHROPLASTY;  Surgeon: [name], MD;  Location: [facility] ORS;  Service: Orthopedics;  Laterality: Left;   TOTAL SHOULDER REPLACEMENT      There were no vitals filed for this visit.   Subjective Assessment - 07/16/21 0807     Subjective Patient reports ongoing pain in back and numbness in BLE.    Pertinent History Pt presents to PT clinic in transport chair and with SPC. He has been to this clinic before for PT d/t back pain, LE weakness and following shoulder surgeries. Pt now presents to PT with c/o decreased activity tolerance, gait ability, and continued decrease in LE strength following back surgery in 03/16/2017 that he states, "went wrong." Pt now has numbness that travels length of LLE. He reports numbness in R foot and ankle. Pt reports his LLE is "worse" than his RLE. Prior to surgery pt ambulated without an AD, but has used a SPC ever since. He reports he was referred to PT d/t fall from his bed 2 months ago. The pt reports he has chronic LBP, but is currently feeling OK d/t taking pain medication prior to PT. He reports his worst back pain reaches a 3-4/10. 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    Limitations Sitting;Walking;Standing;Lifting;House hold activities    How long can you sit comfortably? Pt reports he can sit comfortably for only a few minutes if he has not taken pain medication    How long can you stand comfortably? <10 minutes    How long can you walk comfortably? 10 minutes    Diagnostic tests No recent imaging    Patient Stated Goals Pt would like to get  stronger    Currently in Pain? Yes    Pain Score 3     Pain Location Back    Pain Orientation Posterior;Mid;Lower    Pain Descriptors / Indicators Aching    Pain Type Acute pain    Pain Frequency Intermittent    Aggravating Factors  Could be anything    Pain Relieving Factors Rest, Meds, heat,  INTERVENTIONS:   Therapeutic exercises:  Focused on Low back stretching as patient reports he has no formal exercise regimen for Low back flexibility.  Instructed patient in the following:   Supine Ham/Neural stretch Supine single knee to chest Lower trunk rotation Piriformis stretch *Patient reported increased "shooting leg pain with HS > 45 deg" -stayed within pain free limits.  Each stretch held for 20-30 sec each leg x 3 sets for all exercises. Issued handout and reviewed video with patient to ensure good understanding. Patient verbalized and demo good understanding of below listed HEP.     Access Code: [email or web address] URL: [email or web address] Date: 07/16/2021 Prepared by: Sande Brothers  Exercises Supine Lower Trunk Rotation - 1 x daily - 7 x weekly - 3 sets - 20-30 hold Supine Hamstring Stretch with Strap - 1 x daily - 7 x weekly - 3 sets - 20-30 hold Supine Single Knee to Chest Stretch - 1 x daily - 7 x weekly - 3 sets - 20-30 hold Supine Figure 4 Piriformis Stretch - 1 x daily - 7 x weekly - 3 sets - 1 reps - 20-30 hold     Clinical Impression: Patient responded favorably to low back/LE stretching and present with increased tightness throughout lumbar spine and LE's. He was pain limited but able to work in pain free range of motion and perform well with all new stretches. He verbalized good understanding of issued HEP today as well. The pt will benefit from further skilled PT to continue to improve strength, endurance, gait and balance to increase QOL and decrease fall risk                    PT Short Term Goals - 06/22/21 1629        PT SHORT TERM GOAL #1   Title Patient will be independent in home exercise program to improve strength/mobility for better functional independence with ADLs.    Baseline 8/15: initial HEP initiated    Time 6    Period Weeks    Status New    Target Date 08/03/21               PT Long Term Goals - 06/22/21 1630       PT LONG TERM GOAL #1   Title Patient will increase FOTO score to equal to or greater than 52  to demonstrate statistically significant improvement in mobility and quality of life.    Baseline 8/15: 45    Time 12    Period Weeks    Status New    Target Date 09/14/21      PT LONG TERM GOAL #2   Title Pt will decrease TUG to below 14 seconds/decrease in order to demonstrate decreased fall risk.    Baseline 8/15: 17 sec with SPC, close CGA    Time 12    Period Weeks    Status New    Target Date 09/14/21      PT LONG TERM GOAL #3   Title Pt will decrease 5TSTS by at least 3 seconds in order to demonstrate clinically significant improvement in LE strength.    Baseline 8/15: 15.51 sec without use of BUEs, but does not achieve full ROM with some reps    Time 12    Period Weeks    Status New    Target Date 09/14/21      PT LONG TERM GOAL #4   Title Patient will increase 10 meter walk test to >1.70m/s as  to improve gait speed for better community ambulation and to reduce fall risk.    Baseline 8/15: 0.65 m/s with SPC    Time 12    Period Weeks    Status New    Target Date 09/14/21      PT LONG TERM GOAL #5   Title Patient will increase BLE gross strength to 4+/5 as to improve functional strength for independent gait, increased standing tolerance and increased ADL ability.    Baseline 8/15: strength in LEs grossly 4/5 B    Time 12    Period Weeks    Status New    Target Date 09/14/21      Additional Long Term Goals   Additional Long Term Goals Yes      PT LONG TERM GOAL #6   Title Pt will increase 2MWT by at least 46m (124ft) in order to  demonstrate an improvement in cardiopulmonary endurance and community ambulation    Baseline 8/15: 296 ft with [facility]    Time 12    Period Weeks    Status New    Target Date 09/14/21                   Plan - 07/16/21 1133     Clinical Impression Statement Patient responded favorably to low back/LE stretching and present with increased tightness throughout lumbar spine and LE's. He was pain limited but able to work in pain free range of motion and perform well with all new stretches. He verbalized good understanding of issued HEP today as well. The pt will benefit from further skilled PT to continue to improve strength, endurance, gait and balance to increase QOL and decrease fall risk.    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    Examination-Activity Limitations Bathing;Carry;Lift;Sit;Stand;Bed Mobility;Locomotion Level;Bend;Dressing;Squat;Stairs;Transfers    Examination-Participation Restrictions Medication Management;Cleaning;Meal Prep;Yard Work;Community Activity;Laundry;Shop    Stability/Clinical Decision Making Evolving/Moderate complexity    Rehab Potential Fair    PT Frequency 2x / week    PT Duration 12 weeks    PT Treatment/Interventions ADLs/Self Care Home Management;Aquatic Therapy;Moist Heat;Traction;DME Instruction;Gait training;Stair training;Functional mobility training;Therapeutic activities;Therapeutic exercise;Balance training;Neuromuscular  re-education;Patient/family education;Orthotic Fit/Training;Wheelchair mobility training;Manual techniques;Passive range of motion;Dry needling;Energy conservation;Splinting;Taping;Joint Manipulations    PT Next Visit Plan strengthening, gait exercises, further assessment of balance    PT Home Exercise Plan Access Code: [facility]; 9/8=Access Code: [email or web address]    Consulted and Agree with Plan of Care Patient             Patient will benefit from skilled therapeutic intervention in order to improve the following deficits and impairments:  Abnormal gait, Decreased activity tolerance, Decreased endurance, Decreased range of motion, Impaired sensation, Decreased strength, Hypomobility, Improper body mechanics, Pain, Cardiopulmonary status limiting activity, Decreased balance, Decreased coordination, Decreased mobility, Difficulty walking, Increased muscle spasms, Impaired flexibility, Postural dysfunction  Visit Diagnosis: Abnormality of gait and mobility  Difficulty in walking, not elsewhere classified  Muscle weakness (generalized)  Unsteadiness on feet     Problem List Patient Active Problem List   Diagnosis Date Noted   Chronic pain syndrome 06/16/2021  Chronic, continuous use of opioids 06/16/2021   Numbness of right foot 02/19/2021   Aortic atherosclerosis ([place]) 10/15/2020   Rotator cuff tendinitis, right 01/25/2020   Status post right shoulder hemiarthroplasty 01/25/2020   Anemia in chronic kidney disease 11/29/2019   Benign hypertensive kidney disease with chronic kidney disease 11/29/2019   Chronic kidney disease, stage IV (severe) ([place]) 11/29/2019   Hematuria 11/29/2019   Proteinuria 11/29/2019   Secondary hyperparathyroidism of renal origin ([place]) 11/29/2019   Chronic right shoulder pain 11/12/2019   Thoracic aortic aneurysm without rupture ([place]) 10/09/2019   Weakness 06/07/2017   Olecranon fracture, left, closed, initial encounter 05/27/2017   Spondylolisthesis of  lumbosacral region 03/16/2017   Low back pain 02/01/2017   Escherichia coli (E. coli) infection 01/28/2017   Acute on chronic renal failure (HCC) 01/28/2017   Elevated troponin 01/28/2017   Generalized weakness 01/28/2017   Essential hypertension 01/28/2017   UTI (urinary tract infection) 01/26/2017   AAA (abdominal aortic aneurysm) without rupture ([place]) 11/23/2016   Chronic obstructive pulmonary disease ([place]) 10/09/2015   Personal history of diseases of skin or subcutaneous tissue 10/09/2015   Malignant neoplasm of prostate ([place]) 10/09/2015   Pure hypercholesterolemia 10/09/2015   Sinoatrial node dysfunction ([place]) 10/09/2015   History of surgical procedure 08/22/2015   Status post total shoulder replacement 07/10/2015   Arthritis of shoulder region, degenerative 06/10/2015   Benign essential HTN 06/03/2015   Absolute anemia 04/12/2015   Atrophic kidney 04/12/2015   Essential (primary) hypertension 04/12/2015   Acid reflux 04/12/2015   Cancer of lung ([place]) 04/12/2015   CA of prostate ([place]) 04/12/2015   H/O adenomatous polyp of colon 01/15/2015   Temporary cerebral vascular dysfunction 11/21/2014   Calcific shoulder tendinitis 08/01/2014   Cervical nerve root disorder 04/07/2012    Lewis Moccasin, PT 07/16/2021, 11:34 AM  [place] MAIN [facility] SERVICES [address] [place], [place], [address] Phone: [phone]   Fax:  [phone]  Name: Rick Mcbride. MRN: [id number] Date of Birth: May 18, 1941

## 2021-07-17 DIAGNOSIS — R042 Hemoptysis: Secondary | ICD-10-CM | POA: Diagnosis not present

## 2021-07-17 DIAGNOSIS — D3702 Neoplasm of uncertain behavior of tongue: Secondary | ICD-10-CM | POA: Diagnosis not present

## 2021-07-20 ENCOUNTER — Other Ambulatory Visit: Payer: Self-pay

## 2021-07-20 ENCOUNTER — Ambulatory Visit: Payer: PPO

## 2021-07-20 DIAGNOSIS — M6281 Muscle weakness (generalized): Secondary | ICD-10-CM

## 2021-07-20 DIAGNOSIS — R262 Difficulty in walking, not elsewhere classified: Secondary | ICD-10-CM

## 2021-07-20 DIAGNOSIS — R269 Unspecified abnormalities of gait and mobility: Secondary | ICD-10-CM

## 2021-07-20 DIAGNOSIS — R2681 Unsteadiness on feet: Secondary | ICD-10-CM

## 2021-07-20 NOTE — Therapy (Signed)
[place] MAIN [facility] SERVICES [address] [place], [place], [address] Phone: [phone]   Fax:  [phone]  Physical Therapy Treatment/ Discharge Summary  Patient Details  Name: Rick Mcbride. MRN: [id number] Date of Birth: 01-29-1941 Referring Provider (PT): Vladimir Crofts, MD   Encounter Date: 07/20/2021   PT End of Session - 07/20/21 2152     Visit Number 8    Number of Visits 25    Date for PT Re-Evaluation 09/14/21    Authorization Type [employer]    Authorization Time Period 06/22/21-09/14/21    PT Start Time 84    PT Stop Time 1504    PT Time Calculation (min) 34 min    Equipment Utilized During Treatment Gait belt    Activity Tolerance Patient tolerated treatment well;Patient limited by pain    Behavior During Therapy WFL for tasks assessed/performed             Past Medical History:  Diagnosis Date   AAA (abdominal aortic aneurysm) (HCC)    AAA (abdominal aortic aneurysm) without rupture (HCC)    Anemia    Aneurysm (HCC)    abd aortic   Anxiety    Atrophic kidney    Cervical radiculopathy    Chronic airway obstruction ([place])    not aware of this   Chronic kidney disease (CKD), stage III (moderate) ([place])    followed by Dr. 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 Lucky Cowboy   Lung  cancer [facility])    Lung cancer ([place])    Meralgia paresthetica    Meralgia paresthetica    Neuralgia    Numbness of right foot 02/19/2021   Osteoarthritis    Osteoarthritis    s/p L knee surgery   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   Presence of permanent cardiac pacemaker    Prostate cancer ([place])    Second degree AV block    Followed by Dr. Tiburcio Bash node dysfunction [facility])    Status post partial lobectomy of lung    bottom right    Stroke [facility])    TIA (transient ischemic attack)     Past Surgical History:  Procedure Laterality Date   CATARACT EXTRACTION     COLONOSCOPY     COLONOSCOPY     COLONOSCOPY WITH PROPOFOL N/A 10/20/2015   Procedure: COLONOSCOPY WITH PROPOFOL;  Surgeon: Manya Silvas, MD;  Location: [facility] ENDOSCOPY;  Service: Endoscopy;  Laterality: N/A;   COLONOSCOPY WITH PROPOFOL N/A 11/28/2020   Procedure: COLONOSCOPY WITH PROPOFOL;  Surgeon: Robert Bellow, MD;  Location: [facility] ENDOSCOPY;  Service: Endoscopy;  Laterality: N/A;   CORONARY ARTERY BYPASS GRAFT  triple   coronary atherosclerosis of autologous vein bypass graft     EMBOLIZATION Right 12/27/2016   Procedure: Embolization;  Surgeon: Algernon Huxley, MD;  Location: [place] CV LAB;  Service: Cardiovascular;  Laterality: Right;   ENDOVASCULAR REPAIR/STENT GRAFT N/A 01/05/2017   Procedure: Endovascular Repair/Stent Graft;  Surgeon: Algernon Huxley, MD;  Location: [place] CV LAB;  Service: Cardiovascular;  Laterality: N/A;   EYE SURGERY Bilateral    cataract extraction   INSERT / REPLACE / REMOVE PACEMAKER     JOINT REPLACEMENT     shoulder and knees   KNEE ARTHROSCOPY     LOBECTOMY  01/31/13   RLL w/squamous cell carcinoma lobectomy   LUNG REMOVAL, PARTIAL  2014   right lower lobe   PACEMAKER INSERTION     PACEMAKER INSERTION  12/2012   Dual chanber pacemaker generator   partial shoulder replacement Right    POLYPECTOMY     PROSTATECTOMY     TOTAL KNEE  ARTHROPLASTY Bilateral    TOTAL SHOULDER ARTHROPLASTY Left 07/10/2015   Procedure: TOTAL SHOULDER ARTHROPLASTY;  Surgeon: [name], MD;  Location: [facility] ORS;  Service: Orthopedics;  Laterality: Left;   TOTAL SHOULDER REPLACEMENT      There were no vitals filed for this visit.   Subjective Assessment - 07/20/21 2139     Subjective Patient reports that he does not feel like PT is helping his condition. He reports his issues are chronic and requesting to be discharged today. He was agreeable to reassessment of goals.    Pertinent History Pt presents to PT clinic in transport chair and with SPC. He has been to this clinic before for PT d/t back pain, LE weakness and following shoulder surgeries. Pt now presents to PT with c/o decreased activity tolerance, gait ability, and continued decrease in LE strength following back surgery in 03/16/2017 that he states, "went wrong." Pt now has numbness that travels length of LLE. He reports numbness in R foot and ankle. Pt reports his LLE is "worse" than his RLE. Prior to surgery pt ambulated without an AD, but has used a SPC ever since. He reports he was referred to PT d/t fall from his bed 2 months ago. The pt reports he has chronic LBP, but is currently feeling OK d/t taking pain medication prior to PT. He reports his worst back pain reaches a 3-4/10. 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    Limitations Sitting;Walking;Standing;Lifting;House hold activities    How long can you sit comfortably? Pt reports he can sit comfortably for only a few minutes if he has not taken pain medication    How long can you stand  comfortably? <10 minutes    How long can you walk comfortably? 10 minutes    Diagnostic tests No recent imaging    Patient Stated Goals Pt would like to get stronger    Currently in Pain? Yes    Pain Score 3     Pain Location Back    Pain Orientation Mid;Lower    Pain Descriptors / Indicators Aching    Pain Type Acute pain    Pain Onset More than a month ago  Pain Frequency Intermittent             Due to patient requesting discharge- Reassessed goals today.  See goal section for details of each goal.    Clinical Impression: Patient entered today's visit requesting to be discharge as he reported " not getting better." He was agreeable to Reassessment. PT had discussion that based on the complexity of his impairments that progress may be slower and that he had potential to improve. He actually did improve on almost all goals except for 2 min walk test. He improved with gait velocity, functional strength, and timed up and go test. He even improved with FOTO indicating an improvement in self perceived ability. Despite reviewing this information- patient continued to request discharge. Will discharge patient per his request and encouraged him to follow up with MD if any issues arise.                            PT Education - 07/20/21 2151     Education Details Plan of care. Purpose of functional outcome.    Person(s) Educated Patient    Methods Explanation;Verbal cues    Comprehension Verbalized understanding              PT Short Term Goals - 07/20/21 2156       PT SHORT TERM GOAL #1   Title Patient will be independent in home exercise program to improve strength/mobility for better functional independence with ADLs.    Baseline 8/15: initial HEP initiated: 07/20/2021- Patient reports he has been compliant with current HEP and able to verbalize back and LE exercises.    Time 6    Period Weeks    Status Achieved    Target Date 08/03/21                PT Long Term Goals - 07/20/21 1443       PT LONG TERM GOAL #1   Title Patient will increase FOTO score to equal to or greater than 52  to demonstrate statistically significant improvement in mobility and quality of life.    Baseline 8/15: 45  07/20/2021= 60    Time 12    Period Weeks    Status Achieved      PT LONG TERM GOAL #2   Title Pt will decrease TUG to below 14 seconds/decrease in order to demonstrate decreased fall risk.    Baseline 8/15: 17 sec with SPC, close CGA. 07/20/2021= 15.73 sec.    Time 12    Period Weeks    Status Not Met      PT LONG TERM GOAL #3   Title Pt will decrease 5TSTS by at least 3 seconds in order to demonstrate clinically significant improvement in LE strength.    Baseline 8/15: 15.51 sec without use of BUEs, but does not achieve full ROM with some reps. 07/20/2021= 13. 91 sec without UE Support.    Time 12    Period Weeks    Status Not Met      PT LONG TERM GOAL #4   Title Patient will increase 10 meter walk test to >1.18ms as to improve gait speed for better community ambulation and to reduce fall risk.    Baseline 8/15: 0.65 m/s with SPC. 07/20/2021= 0.77 m/s    Time 12    Period Weeks    Status Not Met      PT LONG TERM GOAL #5  Title Patient will increase BLE gross strength to 4+/5 as to improve functional strength for independent gait, increased standing tolerance and increased ADL ability.    Baseline 8/15: strength in LEs grossly 4/5 B. 07/20/2021= BLE = 4/5 with manual muscle testing in hip flex/abd/Knee flex/ext/ankle DF.    Time 12    Period Weeks    Status Not Met      PT LONG TERM GOAL #6   Title Pt will increase 2MWT by at least 54m(1637f in order to demonstrate an improvement in cardiopulmonary endurance and community ambulation    Baseline 8/15: 296 ft with SPC. 07/20/2021- 280 feet without UE Support.    Time 12    Period Weeks    Status Not Met                   Plan - 07/20/21 2155     Clinical  Impression Statement Patient entered today's visit requesting to be discharge as he reported " not getting better." He was agreeable to Reassessment. PT had discussion that based on the complexity of his impairments that progress may be slower and that he had potential to improve. He actually did improve on almost all goals except for 2 min walk test. He improved with gait velocity, functional strength, and timed up and go test. He even improved with FOTO indicating an improvement in self perceived ability. Despite reviewing this information- patient continued to request discharge. Will discharge patient per his request and encouraged him to follow up with MD if any issues arise.    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    Examination-Activity Limitations Bathing;Carry;Lift;Sit;Stand;Bed Mobility;Locomotion Level;Bend;Dressing;Squat;Stairs;Transfers    Examination-Participation Restrictions Medication Management;Cleaning;Meal Prep;Yard Work;Community Activity;Laundry;Shop    Stability/Clinical Decision Making Evolving/Moderate complexity    Rehab Potential Fair    PT Frequency 2x / week    PT Duration 12 weeks    PT Treatment/Interventions ADLs/Self Care Home Management;Aquatic Therapy;Moist Heat;Traction;DME Instruction;Gait training;Stair training;Functional mobility training;Therapeutic activities;Therapeutic exercise;Balance training;Neuromuscular re-education;Patient/family  education;Orthotic Fit/Training;Wheelchair mobility training;Manual techniques;Passive range of motion;Dry needling;Energy conservation;Splinting;Taping;Joint Manipulations    PT Next Visit Plan Patient to be discharged today per patient request.    Consulted and Agree with Plan of Care Patient             Patient will benefit from skilled therapeutic intervention in order to improve the following deficits and impairments:  Abnormal gait, Decreased activity tolerance, Decreased endurance, Decreased range of motion, Impaired sensation, Decreased strength, Hypomobility, Improper body mechanics, Pain, Cardiopulmonary status limiting activity, Decreased balance, Decreased coordination, Decreased mobility, Difficulty walking, Increased muscle spasms, Impaired flexibility, Postural dysfunction  Visit Diagnosis: Abnormality of gait and mobility  Muscle weakness (generalized)  Difficulty in walking, not elsewhere classified  Unsteadiness on feet     Problem List Patient Active Problem List   Diagnosis Date Noted   Chronic pain syndrome 06/16/2021   Chronic, continuous use of opioids 06/16/2021   Numbness of right foot 02/19/2021   Aortic atherosclerosis ([place]12/06/2020   Rotator cuff tendinitis, right 01/25/2020   Status post right shoulder hemiarthroplasty 01/25/2020  Anemia in chronic kidney disease 11/29/2019   Benign hypertensive kidney disease with chronic kidney disease 11/29/2019   Chronic kidney disease, stage IV (severe) (HCC) 11/29/2019   Hematuria 11/29/2019   Proteinuria 11/29/2019   Secondary hyperparathyroidism of renal origin ([place]) 11/29/2019   Chronic right shoulder pain 11/12/2019   Thoracic aortic aneurysm without rupture ([place]) 10/09/2019   Weakness 06/07/2017   Olecranon fracture, left, closed, initial encounter 05/27/2017   Spondylolisthesis of lumbosacral region 03/16/2017   Low back pain 02/01/2017   Escherichia coli (E. coli) infection 01/28/2017   Acute  on chronic renal failure ([place]) 01/28/2017   Elevated troponin 01/28/2017   Generalized weakness 01/28/2017   Essential hypertension 01/28/2017   UTI (urinary tract infection) 01/26/2017   AAA (abdominal aortic aneurysm) without rupture ([place]) 11/23/2016   Chronic obstructive pulmonary disease ([place]) 10/09/2015   Personal history of diseases of skin or subcutaneous tissue 10/09/2015   Malignant neoplasm of prostate ([place]) 10/09/2015   Pure hypercholesterolemia 10/09/2015   Sinoatrial node dysfunction ([place]) 10/09/2015   History of surgical procedure 08/22/2015   Status post total shoulder replacement 07/10/2015   Arthritis of shoulder region, degenerative 06/10/2015   Benign essential HTN 06/03/2015   Absolute anemia 04/12/2015   Atrophic kidney 04/12/2015   Essential (primary) hypertension 04/12/2015   Acid reflux 04/12/2015   Cancer of lung ([place]) 04/12/2015   CA of prostate ([place]) 04/12/2015   H/O adenomatous polyp of colon 01/15/2015   Temporary cerebral vascular dysfunction 11/21/2014   Calcific shoulder tendinitis 08/01/2014   Cervical nerve root disorder 04/07/2012    Lewis Moccasin, PT 07/20/2021, 10:10 PM  [place] MAIN [facility] SERVICES [address] [place], [place], [address] Phone: [phone]   Fax:  [phone]  Name: Rick Mcbride. MRN: [id number] Date of Birth: Aug 28, 1941

## 2021-07-22 ENCOUNTER — Ambulatory Visit: Payer: PPO

## 2021-07-27 ENCOUNTER — Ambulatory Visit: Payer: PPO

## 2021-07-27 DIAGNOSIS — M19011 Primary osteoarthritis, right shoulder: Secondary | ICD-10-CM | POA: Diagnosis not present

## 2021-07-27 DIAGNOSIS — Z96611 Presence of right artificial shoulder joint: Secondary | ICD-10-CM | POA: Diagnosis not present

## 2021-07-29 ENCOUNTER — Ambulatory Visit: Payer: PPO | Admitting: Physical Therapy

## 2021-08-03 ENCOUNTER — Ambulatory Visit: Payer: PPO

## 2021-08-05 ENCOUNTER — Ambulatory Visit: Payer: PPO | Admitting: Physical Therapy

## 2021-08-06 ENCOUNTER — Encounter: Payer: Self-pay | Admitting: Unknown Physician Specialty

## 2021-08-06 ENCOUNTER — Encounter
Admission: RE | Admit: 2021-08-06 | Discharge: 2021-08-06 | Disposition: A | Discharge status: Home / Self Care | Payer: PPO | Source: Ambulatory Visit | Attending: Unknown Physician Specialty | Admitting: Unknown Physician Specialty

## 2021-08-06 ENCOUNTER — Other Ambulatory Visit
Admission: RE | Admit: 2021-08-06 | Discharge: 2021-08-06 | Disposition: A | Discharge status: Home / Self Care | Payer: PPO | Source: Ambulatory Visit | Attending: Unknown Physician Specialty | Admitting: Unknown Physician Specialty

## 2021-08-06 ENCOUNTER — Other Ambulatory Visit: Payer: Self-pay

## 2021-08-06 DIAGNOSIS — Z01810 Encounter for preprocedural cardiovascular examination: Secondary | ICD-10-CM | POA: Diagnosis not present

## 2021-08-06 NOTE — Patient Instructions (Addendum)
Your procedure is scheduled on:08/10/2021 Report to the Registration Desk on the 1st floor of the [facility]. To find out your arrival time, please call [phone] between 1PM - 3PM on: 08/07/2021   REMEMBER: Instructions that are not followed completely may result in serious medical risk, up to and including death; or upon the discretion of your surgeon and anesthesiologist your surgery may need to be rescheduled.  Do not eat food after midnight the night before surgery.  No gum chewing, lozengers or hard candies.  You may however, drink CLEAR liquids up to 2 hours before you are scheduled to arrive for your surgery. Do not drink anything within 2 hours of your scheduled arrival time.  Clear liquids include: - water  - apple juice without pulp - gatorade (not RED, PURPLE, OR BLUE) - black coffee or tea (Do NOT add milk or creamers to the coffee or tea) Do NOT drink anything that is not on this list.     TAKE THESE MEDICATIONS THE MORNING OF SURGERY WITH A SIP OF WATER:  Protonix (take one the night before and one on the morning of surgery - helps to prevent nausea after surgery.) Cetirizine Zetia Toprol Tramadol   Use inhalers on the day of surgery and bring to the hospital.    Follow recommendations from Cardiologist, Pulmonologist or PCP regarding stopping , aspirin and  Plavix,   One week prior to surgery: Stop Anti-inflammatories (NSAIDS) such as Advil, Aleve, Ibuprofen, Motrin, Naproxen, Naprosyn and Aspirin based products such as Excedrin, Goodys Powder, BC Powder.  Stop ANY OVER THE COUNTER supplements until after surgery like Vit B12, probiotic,multivitamins,melatonin,ferrous sulfate,calcium carb and Vit C  You may however, continue to take Tylenol if needed for pain up until the day of surgery.  No Alcohol for 24 hours before or after surgery.  No Smoking including e-cigarettes for 24 hours prior to surgery.  No chewable tobacco products for at least 6  hours prior to surgery.  No nicotine patches on the day of surgery.  Do not use any "recreational" drugs for at least a week prior to your surgery.  Please be advised that the combination of cocaine and anesthesia may have negative outcomes, up to and including death. If you test positive for cocaine, your surgery will be cancelled.  On the morning of surgery brush your teeth with toothpaste and water, you may rinse your mouth with mouthwash if you wish. Do not swallow any toothpaste or mouthwash.   Do not wear jewelry.  Do not wear lotions, powders, or perfumes.   Do not shave body from the neck down 48 hours prior to surgery just in case you cut yourself which could leave a site for infection.  Also, freshly shaved skin may become irritated if using the CHG soap.  Contact lenses, hearing aids and dentures may not be worn into surgery.  Do not bring valuables to the hospital. [facility] is not responsible for any missing/lost belongings or valuables.    Notify your doctor if there is any change in your medical condition (cold, fever, infection).  Wear comfortable clothing (specific to your surgery type) to the hospital.  After surgery, you can help prevent lung complications by doing breathing exercises.  Take deep breaths and cough every 1-2 hours. Your doctor may order a device called an Incentive Spirometer to help you take deep breaths.  If you are being admitted to the hospital overnight, leave your suitcase in the car. After surgery it may  be brought to your room.  If you are being discharged the day of surgery, you will not be allowed to drive home. You will need a responsible adult (18 years or older) to drive you home and stay with you that night.   If you are taking public transportation, you will need to have a responsible adult (18 years or older) with you. Please confirm with your physician that it is acceptable to use public transportation.   Please call the  [facility] Dept. at [phone] if you have any questions about these instructions.  Surgery Visitation Policy:  Patients undergoing a surgery or procedure may have one family member or support person with them as long as that person is not COVID-19 positive or experiencing its symptoms.  That person may remain in the waiting area during the procedure and may rotate out with other people.  Inpatient Visitation:    Visiting hours are 7 a.m. to 8 p.m. Up to two visitors ages 16+ are allowed at one time in a patient room. The visitors may rotate out with other people during the day. Visitors must check out when they leave, or other visitors will not be allowed. One designated support person may remain overnight. The visitor must pass COVID-19 screenings, use hand sanitizer when entering and exiting the patient's room and wear a mask at all times, including in the patient's room. Patients must also wear a mask when staff or their visitor are in the room. Masking is required regardless of vaccination status.

## 2021-08-07 ENCOUNTER — Encounter: Payer: Self-pay | Admitting: Unknown Physician Specialty

## 2021-08-07 NOTE — Progress Notes (Signed)
Perioperative Services  Pre-Admission/Anesthesia Testing Clinical Review  Date: 08/07/21  Patient Demographics:  Name: Rick Mcbride. DOB:   [birth year] MRN:   [id number]  Planned Surgical Procedure(s):    Case: [id number] Date/Time: 08/10/21 0838   Procedures:      MICRODIRECT LARYNGOSCOPY WITH BIOPSY OF TONGUE BASE (Right)     TONGUE BIOPSY   Anesthesia type: General   Pre-op diagnosis: Neoplasm uncertain behavior tongue   Location: [facility] OR ROOM 09 / [place] [facility]S FOR ANESTHESIA GROUP   Surgeons: Beverly Gust, MD     NOTE: Available PAT nursing documentation and vital signs have been reviewed. Clinical nursing staff has updated patient's PMH/PSHx, current medication list, and drug allergies/intolerances to ensure comprehensive history available to assist in medical decision making as it pertains to the aforementioned surgical procedure and anticipated anesthetic course. Extensive review of available clinical information performed. [place] PMH and PSHx updated with any diagnoses/procedures that  may have been inadvertently omitted during his intake with the pre-admission testing department's nursing staff.  Clinical Discussion:  Rick Mcbride. is a 80 y.o. male who is submitted for pre-surgical anesthesia review and clearance prior to him undergoing the above procedure. Patient is a Former Smoker (67.5 pack years; quit 03/2004). 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  Patient is followed by cardiology Nehemiah Massed, MD). He was last seen in the cardiology clinic on 04/27/2021; notes reviewed. At the time of his clinic visit,  the patient denied any chest pain, shortness of breath, PND, orthopnea, palpitations, significant peripheral edema, vertiginous symptoms, or presyncope/syncope.  Patient with a PMH significant for cardiovascular diagnoses.  Patient underwent a three-vessel CABG procedure on 05/01/2004.  LVEF at that time was mildly decreased at 40-49%.  LIMA-LAD, SVG-OM1, and SVG-PDA bypass grafts were placed.  Patient with a history of SA node dysfunction and second-degree AV block.  He underwent PPM placement in 12/2012.  Patient has a history of pulmonary malignancy.  Biopsy-proven right lower lobe squamous cell carcinoma resected on 01/19/2013.  Patient underwent endovascular repair of a known AAA on 01/05/2017.  Most recent imaging performed on 03/31/2021 demonstrated a 6.6 x 6.9 cm native aneurysm sac remaining.  Additionally, patient has a RIGHT common iliac artery aneurysm that measured 2.7 cm by CT on 03/31/2021.  TTE performed on 10/24/2019 revealed mild left ventricular systolic dysfunction with mild LVH; LVEF 45-50%.  There was trivial TR and mild AR and MR; no PR.  Left atrium was moderately enlarged.  There was no evidence of valvular stenosis.  Myocardial perfusion imaging study performed on 10/25/2019 revealing an LVEF of 55%.  There was normal myocardial thickening and wall motion.  There was no evidence of stress-induced myocardial ischemia or arrhythmia.  Study determined to be normal and low risk.  Given cardiovascular history, patient is on daily DAPT therapy (ASA + clopidogrel); compliant with therapy with no evidence of GI bleeding.  Blood pressure reasonably controlled at 132/80 on currently prescribed CCB, ARB, and beta-blocker therapies.  Patient is on a statin and ezetimibe for his HLD.  He is not diabetic. Functional capacity, as defined by DASI, is documented as being >/= 4 METS.  No changes were made to his medication regimen.  Patient to follow-up with outpatient cardiology in 9 months or  sooner if needed.  Recent CT imaging of the neck performed on 07/30/2021 revealed an area of asymmetric soft tissue at the RIGHT base of the tongue with an associated superficial 8 mm lesion.  There is concern for possible malignancy. Patient is scheduled to undergo MICRODIRECT LARYNGOSCOPY WITH BIOPSY OF THE TONGUE BASE on 08/10/2021 with Dr. Beverly Gust, MD. Given patient's past medical history significant for cardiovascular diagnoses, presurgical cardiac clearance was sought by the performing surgeon's office and PAT team. Per cardiology, "this patient is optimized for surgery and may proceed with the planned procedural course with a LOW risk of significant perioperative cardiovascular complications". Again, this patient is on daily DAPT therapy. He has been instructed on recommendations for holding his DAPT medications for 4 days prior to his procedure with plans to restart as soon as postoperative bleeding risk felt to be minimized by his attending surgeon. The patient has been instructed that his last dose of his anticoagulant will be on 08/05/2021.  Patient denies previous perioperative complications with anesthesia in the past. In review of the available records, it is noted that patient underwent a general anesthetic course here (ASA IV) in 11/2020 without documented complications.   Vitals with BMI 03/09/2021 11/28/2020 11/28/2020  Height _0  - -  Weight 218 lbs - -  BMI 10.27 - -  Systolic 253 664 403  Diastolic 90 71 90  Pulse 71 61 62    Providers/Specialists:   NOTE: Primary physician provider listed below. Patient may have been seen by APP or partner within same practice.   PROVIDER ROLE / SPECIALTY LAST Jarvis Newcomer, MD OTOLARYNGOLOGY (SURGEON) 07/17/2021  Idelle Crouch, MD PRIMARY CARE PROVIDER 07/08/2021  Serafina Royals, MD CARDIOLOGY 04/27/2021  Lavonia Dana, MD NEPHROLOGY 03/10/2021   Allergies:  Patient has no known allergies.  Current Home  Medications:   No current facility-administered medications for this encounter.    acetaminophen (TYLENOL) 500 MG tablet   ascorbic acid (VITAMIN C) 1000 MG tablet   aspirin EC 81 MG tablet   atorvastatin (LIPITOR) 20 MG tablet   Calcium Carb-Cholecalciferol (CALCIUM 600 + D PO)   cetirizine (ZYRTEC) 10 MG tablet   clopidogrel (PLAVIX) 75 MG tablet   clotrimazole (LOTRIMIN) 1 % cream   docusate sodium (COLACE) 100 MG capsule   donepezil (ARICEPT) 5 MG tablet   ezetimibe (ZETIA) 10 MG tablet   Ferrous Sulfate Dried (SLOW RELEASE IRON) 45 MG TBCR   ipratropium (ATROVENT) 0.03 % nasal spray   losartan (COZAAR) 100 MG tablet   Melatonin 10 MG TABS   methocarbamol (ROBAXIN) 500 MG tablet   metoprolol succinate (TOPROL-XL) 25 MG 24 hr tablet   Multiple Vitamin (MULTIVITAMIN WITH MINERALS) TABS tablet   Multiple Vitamins-Minerals (PRESERVISION AREDS 2) CAPS   pantoprazole (PROTONIX) 40 MG tablet   polyethylene glycol (MIRALAX / GLYCOLAX) 17 g packet   Probiotic Product (PROBIOTIC PO)   traMADol (ULTRAM) 50 MG tablet   vitamin B-12 (CYANOCOBALAMIN) 1000 MCG tablet   zolpidem (AMBIEN) 10 MG tablet   amLODipine (NORVASC) 5 MG tablet   History:   Past Medical History:  Diagnosis Date   AAA (abdominal aortic aneurysm) (HCC)    a.) s/p EVAR 01/05/2017. b.) native aneurysm sac 6.6 x 6.9 cm by CT on 03/31/2021   Abnormality of tongue    a.) CT head/neck 07/10/2021 --> asymmetric soft tissue at the RIGHT tongue base with a superficial 8  mm lesion.   Anemia    Aneurysm of right common iliac artery (HCC)    a.) measured 2.7 cm by CT on 03/31/2021   Anxiety    Aortic atherosclerosis (HCC)    Atrophic kidney    B12 deficiency    CAD (coronary artery disease)    Cervical radiculopathy    Chronic airway obstruction (HCC)    Chronic kidney disease (CKD), stage III (moderate) (HCC)    Chronic pain syndrome 06/16/2021   Chronic right shoulder pain 11/12/2019   Chronic tension headaches     Chronic, continuous use of opioids 06/16/2021   Coronary artery disease    DDD (degenerative disc disease), lumbar    Degenerative disc disease, lumbar    with lumbar radiculopathy   Elbow fracture, left    GERD (gastroesophageal reflux disease)    H/O adenomatous polyp of colon    H/O hemorrhoids    HTN (hypertension)    Hyperlipidemia    Meralgia paresthetica    Mild cognitive impairment    Nephrolithiasis    Neuralgia    Numbness of right foot 02/19/2021   Osteoarthritis    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   Presence of permanent cardiac pacemaker    Prostate cancer [facility])    a.) s/p prostatectomy   S/P CABG x 3 05/01/2004   a.) LVEF 40-49%; LIMA-LAD, SVG-OM1, SVG-PDA   Second degree AV block    Sinoatrial node dysfunction (HCC)    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[place]) 01/31/2013   a.) RLL squamous cell carcinoma   Status post partial lobectomy of lung    a.) s/p RLL resection on 01/19/2013   Stroke [facility])    TIA (transient ischemic attack)    Valvular regurgitation    a.) TTE 10/24/2019 --> LVEF 45-50%; trivial TR, mild AR and MR; moderate LA dilitation.   Past Surgical History:  Procedure Laterality Date   CATARACT EXTRACTION Bilateral    COLONOSCOPY     COLONOSCOPY     COLONOSCOPY WITH PROPOFOL N/A 10/20/2015   Procedure: COLONOSCOPY WITH PROPOFOL;  Surgeon: Manya Silvas, MD;  Location: [facility] ENDOSCOPY;  Service: Endoscopy;  Laterality: N/A;   COLONOSCOPY WITH PROPOFOL N/A 11/28/2020   Procedure: COLONOSCOPY WITH PROPOFOL;  Surgeon: Robert Bellow, MD;  Location: [facility] ENDOSCOPY;  Service: Endoscopy;  Laterality: N/A;   CORONARY ARTERY BYPASS GRAFT N/A 05/01/2004   Procedure: 3v CABG (LIMA-LAD, SVG-OM1, SVG-PDA); Location: [facility]; Surgeon: Ander Gaster, MD   EMBOLIZATION Right 12/27/2016   Procedure: Embolization;  Surgeon: Algernon Huxley, MD;  Location: [place] CV LAB;  Service: Cardiovascular;  Laterality: Right;    ENDOVASCULAR REPAIR/STENT GRAFT N/A 01/05/2017   Procedure: Endovascular Repair/Stent Graft;  Surgeon: Algernon Huxley, MD;  Location: [place] CV LAB;  Service: Cardiovascular;  Laterality: N/A;   INSERT / REPLACE / REMOVE PACEMAKER     JOINT REPLACEMENT     shoulder and knees   KNEE ARTHROSCOPY     LUNG LOBECTOMY Right 01/31/2013   Procedure: RIGHT PULMONARY LOBECTOMY; Location: [place]; Surgeon: Nestor Lewandowsky, MD   PACEMAKER INSERTION  12/2012   Dual chanber pacemaker generator   POLYPECTOMY     POSTERIOR LUMBAR FUSION     Procedure: POSTERIOR LUMBAR INTERBODY FUSION, INTERBODY PROSTHESIS, POSTERIOR LATERAL ARTHRODESIS, POSTERIOR NON-SEGMENTAL INSTRUMENTATION LUMBAR FIVE- SACRAL ONE; Location: [facility]; Surgeon: Newman Pies, MD   PROSTATECTOMY     TOTAL KNEE ARTHROPLASTY Bilateral    TOTAL SHOULDER  ARTHROPLASTY Left 07/10/2015   Procedure: TOTAL SHOULDER ARTHROPLASTY;  Surgeon: [name], MD;  Location: [facility] ORS;  Service: Orthopedics;  Laterality: Left;   TOTAL SHOULDER REPLACEMENT     Family History  Problem Relation Age of Onset   Heart attack Mother    Heart attack Father    Breast cancer Sister    Asthma Sister    Social History   Tobacco Use   Smoking status: Former    Packs/day: 1.50    Years: 45.00    Pack years: 67.50    Types: Cigarettes    Quit date: 04/07/2004    Years since quitting: 17.3   Smokeless tobacco: Never  Vaping Use   Vaping Use: Never used  Substance Use Topics   Alcohol use: No   Drug use: No    Pertinent Clinical Results:  LABS: Labs reviewed: Acceptable for surgery.   Ref Range & Units 07/08/2021  WBC (White Blood Cell Count) 4.1 - 10.2 10^3/uL 6.7   RBC (Red Blood Cell Count) 4.69 - 6.13 10^6/uL 3.82 Low    Hemoglobin 14.1 - 18.1 gm/dL 11.3 Low    Hematocrit 40.0 - 52.0 % 35.9 Low    MCV (Mean Corpuscular Volume) 80.0 - 100.0 fl 94.0   MCH (Mean Corpuscular Hemoglobin) 27.0 - 31.2 pg 29.6   MCHC (Mean Corpuscular  Hemoglobin Concentration) 32.0 - 36.0 gm/dL 31.5 Low    Platelet Count 150 - 450 10^3/uL 185   RDW-CV (Red Cell Distribution Width) 11.6 - 14.8 % 14.6   MPV (Mean Platelet Volume) 9.4 - 12.4 fl 10.5   Neutrophils 1.50 - 7.80 10^3/uL 4.52   Lymphocytes 1.00 - 3.60 10^3/uL 1.29   Monocytes 0.00 - 1.50 10^3/uL 0.65   Eosinophils 0.00 - 0.55 10^3/uL 0.19   Basophils 0.00 - 0.09 10^3/uL 0.02   Neutrophil % 32.0 - 70.0 % 67.5   Lymphocyte % 10.0 - 50.0 % 19.3   Monocyte % 4.0 - 13.0 % 9.7   Eosinophil % 1.0 - 5.0 % 2.8   Basophil% 0.0 - 2.0 % 0.3   Immature Granulocyte % <=0.7 % 0.4   Immature Granulocyte Count <=0.06 10^3/L 0.03   Resulting Agency  [facility] - LAB  Specimen Collected: 07/08/21 15:45 Last Resulted: 07/08/21 16:10  Received From: [place]  Result Received: 07/09/21 14:26    Ref Range & Units 07/08/2021  Glucose 70 - 110 mg/dL 97   Sodium 136 - 145 mmol/L 143   Potassium 3.6 - 5.1 mmol/L 4.9   Chloride 97 - 109 mmol/L 107   Carbon Dioxide (CO2) 22.0 - 32.0 mmol/L 29.3   Urea Nitrogen (BUN) 7 - 25 mg/dL 30 High    Creatinine 0.7 - 1.3 mg/dL 2.3 High    Glomerular Filtration Rate (eGFR), MDRD Estimate >60 mL/min/1.73sq m 27 Low    Calcium 8.7 - 10.3 mg/dL 9.4   AST  8 - 39 U/L 15   ALT  6 - 57 U/L 18   Alk Phos (alkaline Phosphatase) 34 - 104 U/L 69   Albumin 3.5 - 4.8 g/dL 4.0   Bilirubin, Total 0.3 - 1.2 mg/dL 0.3   Protein, Total 6.1 - 7.9 g/dL 6.0 Low    A/G Ratio 1.0 - 5.0 gm/dL 2.0   Resulting Agency  [place] - LAB  Specimen Collected: 07/08/21 15:45 Last Resulted: 07/08/21 17:25  Received From: [place]  Result Received: 07/09/21 14:26    ECG: Date:  08/06/2021 Time ECG obtained: 1413 PM Rate: 64 bpm Rhythm:  AV dual paced rhythm with occasional PVCs Axis (leads I and aVF): Normal Intervals: PR 176 ms. QRS 178 ms. QTc 503 ms. ST segment and T wave changes: No evidence of acute ST segment  elevation or depression Comparison: SR with 1st degree AV block noted on 02/12/2021 tracing at [facility].   IMAGING / PROCEDURES: CT SOFT TISSUE NECK WITHOUT CONTRAST performed on 07/10/2021 Asymmetric soft tissue at the RIGHT tongue base with a superficial 8 mm lesion. This may represent a primary squamous cell carcinoma. No significant adenopathy. Atherosclerosis. Emphysema   CT ABDOMEN PELVIS WITHOUT CONTRAST performed on 03/31/2021 Left renal stone without obstruction. No additional findings to explain the patient's clinical history. Aorto bi-iliac endograft repair of an infrarenal aortic aneurysm which has enlarged significantly from preoperative examination 12/09/2016, now measuring 6.6 x 6.9 cm. Trace right pleural effusion. Emphysema Aortic atherosclerosis Right common iliac artery aneurysm measuring 2.7 cm.  VASCULAR ULTRASOUND EVAR DUPLEX performed on 11/04/2020 Endovascular aneurysm repair with no evidence of endoleak Largest aortic diameter remains essentially unchanged compared to prior exam Previous diameter was 6.4 cm obtained on 11/06/2019  LEXISCAN performed on 10/25/2019 LVEF 55% Normal myocardial thickening and wall motion No artifacts noted Left ventricular cavity size normal No evidence of stress-induced myocardial ischemia or arrhythmia The overall quality of the study is good  TRANSTHORACIC ECHOCARDIOGRAM performed on 10/24/2019 LVEF 45-50% Mild left ventricular systolic dysfunction with mild LVH Normal right ventricular systolic function Mild AR, MR Trivial TR No PR No evidence of valvular stenosis No pericardial effusion  CORONARY ARTERY BYPASS GRAFTING performed on 05/01/2004 LVEF 40-49% Three-vessel CABG procedure LIMA-LAD SVG-OM1 SVG-PDA  Impression and Plan:  Rick Mcbride. has been referred for pre-anesthesia review and clearance prior to him undergoing the planned anesthetic and procedural courses. Available labs, pertinent  testing, and imaging results were personally reviewed by me. This patient has been appropriately cleared by cardiology with an overall LOW risk of significant perioperative cardiovascular complications. Completed perioperative prescription for cardiac device management documentation completed by primary cardiology team and placed on patient's chart for review by the surgical/anesthetic team on the day of his procedure.   Based on clinical review performed today (08/07/21), barring any significant acute changes in the patient's overall condition, it is anticipated that he will be able to proceed with the planned surgical intervention. Any acute changes in clinical condition may necessitate his procedure being postponed and/or cancelled. Patient will meet with anesthesia team (MD and/or CRNA) on the day of his procedure for preoperative evaluation/assessment. Questions regarding anesthetic course will be fielded at that time.   Pre-surgical instructions were reviewed with the patient during his PAT appointment and questions were fielded by PAT clinical staff. Patient was advised that if any questions or concerns arise prior to his procedure then he should return a call to PAT and/or his surgeon's office to discuss.  Honor Loh, MSN, APRN, FNP-C, CEN [facility]  Peri-operative Services Nurse Practitioner Phone: [phone] Fax: [phone] 08/07/21 11:57 AM  NOTE: This note has been prepared using Dragon dictation software. Despite my best ability to proofread, there is always the potential that unintentional transcriptional errors may still occur from this process.

## 2021-08-10 ENCOUNTER — Ambulatory Visit: Payer: PPO | Admitting: Urgent Care

## 2021-08-10 ENCOUNTER — Encounter: Payer: Self-pay | Admitting: Unknown Physician Specialty

## 2021-08-10 ENCOUNTER — Ambulatory Visit
Admission: RE | Admit: 2021-08-10 | Discharge: 2021-08-10 | Disposition: A | Discharge status: Home / Self Care | Payer: PPO | Source: Ambulatory Visit | Attending: Unknown Physician Specialty | Admitting: Unknown Physician Specialty

## 2021-08-10 ENCOUNTER — Ambulatory Visit: Payer: PPO

## 2021-08-10 ENCOUNTER — Other Ambulatory Visit: Payer: Self-pay

## 2021-08-10 ENCOUNTER — Encounter
Admission: RE | Disposition: A | Discharge status: Home / Self Care | Payer: Self-pay | Source: Ambulatory Visit | Attending: Unknown Physician Specialty

## 2021-08-10 DIAGNOSIS — C01 Malignant neoplasm of base of tongue: Secondary | ICD-10-CM | POA: Diagnosis not present

## 2021-08-10 DIAGNOSIS — D649 Anemia, unspecified: Secondary | ICD-10-CM | POA: Diagnosis not present

## 2021-08-10 DIAGNOSIS — Z79891 Long term (current) use of opiate analgesic: Secondary | ICD-10-CM | POA: Insufficient documentation

## 2021-08-10 DIAGNOSIS — K219 Gastro-esophageal reflux disease without esophagitis: Secondary | ICD-10-CM | POA: Insufficient documentation

## 2021-08-10 DIAGNOSIS — G894 Chronic pain syndrome: Secondary | ICD-10-CM | POA: Diagnosis not present

## 2021-08-10 DIAGNOSIS — Z902 Acquired absence of lung [part of]: Secondary | ICD-10-CM | POA: Diagnosis not present

## 2021-08-10 DIAGNOSIS — I723 Aneurysm of iliac artery: Secondary | ICD-10-CM | POA: Insufficient documentation

## 2021-08-10 DIAGNOSIS — E785 Hyperlipidemia, unspecified: Secondary | ICD-10-CM | POA: Insufficient documentation

## 2021-08-10 DIAGNOSIS — I714 Abdominal aortic aneurysm, without rupture, unspecified: Secondary | ICD-10-CM | POA: Insufficient documentation

## 2021-08-10 DIAGNOSIS — Z7982 Long term (current) use of aspirin: Secondary | ICD-10-CM | POA: Diagnosis not present

## 2021-08-10 DIAGNOSIS — M1990 Unspecified osteoarthritis, unspecified site: Secondary | ICD-10-CM | POA: Insufficient documentation

## 2021-08-10 DIAGNOSIS — Z951 Presence of aortocoronary bypass graft: Secondary | ICD-10-CM | POA: Diagnosis not present

## 2021-08-10 DIAGNOSIS — Z79899 Other long term (current) drug therapy: Secondary | ICD-10-CM | POA: Insufficient documentation

## 2021-08-10 DIAGNOSIS — D3702 Neoplasm of uncertain behavior of tongue: Secondary | ICD-10-CM | POA: Diagnosis present

## 2021-08-10 DIAGNOSIS — I2510 Atherosclerotic heart disease of native coronary artery without angina pectoris: Secondary | ICD-10-CM | POA: Diagnosis not present

## 2021-08-10 DIAGNOSIS — I129 Hypertensive chronic kidney disease with stage 1 through stage 4 chronic kidney disease, or unspecified chronic kidney disease: Secondary | ICD-10-CM | POA: Diagnosis not present

## 2021-08-10 DIAGNOSIS — Z7902 Long term (current) use of antithrombotics/antiplatelets: Secondary | ICD-10-CM | POA: Insufficient documentation

## 2021-08-10 DIAGNOSIS — Z8673 Personal history of transient ischemic attack (TIA), and cerebral infarction without residual deficits: Secondary | ICD-10-CM | POA: Insufficient documentation

## 2021-08-10 DIAGNOSIS — Z85118 Personal history of other malignant neoplasm of bronchus and lung: Secondary | ICD-10-CM | POA: Insufficient documentation

## 2021-08-10 DIAGNOSIS — Z87891 Personal history of nicotine dependence: Secondary | ICD-10-CM | POA: Insufficient documentation

## 2021-08-10 DIAGNOSIS — N183 Chronic kidney disease, stage 3 unspecified: Secondary | ICD-10-CM | POA: Diagnosis not present

## 2021-08-10 DIAGNOSIS — I700 Atherosclerosis of aorta: Secondary | ICD-10-CM | POA: Insufficient documentation

## 2021-08-10 DIAGNOSIS — Z96612 Presence of left artificial shoulder joint: Secondary | ICD-10-CM | POA: Insufficient documentation

## 2021-08-10 DIAGNOSIS — I441 Atrioventricular block, second degree: Secondary | ICD-10-CM | POA: Insufficient documentation

## 2021-08-10 DIAGNOSIS — G3184 Mild cognitive impairment, so stated: Secondary | ICD-10-CM | POA: Diagnosis not present

## 2021-08-10 DIAGNOSIS — F419 Anxiety disorder, unspecified: Secondary | ICD-10-CM | POA: Diagnosis not present

## 2021-08-10 DIAGNOSIS — Z9079 Acquired absence of other genital organ(s): Secondary | ICD-10-CM | POA: Insufficient documentation

## 2021-08-10 DIAGNOSIS — Z8546 Personal history of malignant neoplasm of prostate: Secondary | ICD-10-CM | POA: Insufficient documentation

## 2021-08-10 DIAGNOSIS — Z950 Presence of cardiac pacemaker: Secondary | ICD-10-CM | POA: Insufficient documentation

## 2021-08-10 HISTORY — DX: Deficiency of other specified B group vitamins: E53.8

## 2021-08-10 HISTORY — DX: Aneurysm of iliac artery: I72.3

## 2021-08-10 HISTORY — PX: TONGUE BIOPSY: SHX6575

## 2021-08-10 HISTORY — DX: Other congenital malformations of tongue: Q38.3

## 2021-08-10 HISTORY — DX: Endocarditis, valve unspecified: I38

## 2021-08-10 HISTORY — DX: Mild cognitive impairment of uncertain or unknown etiology: G31.84

## 2021-08-10 HISTORY — DX: Atherosclerosis of aorta: I70.0

## 2021-08-10 HISTORY — DX: Atherosclerotic heart disease of native coronary artery without angina pectoris: I25.10

## 2021-08-10 HISTORY — PX: MICROLARYNGOSCOPY: SHX5208

## 2021-08-10 HISTORY — DX: Calculus of kidney: N20.0

## 2021-08-10 HISTORY — DX: Presence of aortocoronary bypass graft: Z95.1

## 2021-08-10 HISTORY — DX: Essential (primary) hypertension: I10

## 2021-08-10 SURGERY — MICROLARYNGOSCOPY
Anesthesia: General | Laterality: Right

## 2021-08-10 MED ORDER — CHLORHEXIDINE GLUCONATE 0.12 % MT SOLN
OROMUCOSAL | Status: AC
  Administered 2021-08-10: 15 mL via OROMUCOSAL
  Filled 2021-08-10: qty 15

## 2021-08-10 MED ORDER — OXYMETAZOLINE HCL 0.05 % NA SOLN
NASAL | Status: AC
  Filled 2021-08-10: qty 30

## 2021-08-10 MED ORDER — FENTANYL CITRATE (PF) 100 MCG/2ML IJ SOLN: 25.0000 ug | INTRAMUSCULAR | Status: DC | PRN

## 2021-08-10 MED ORDER — PROMETHAZINE HCL 25 MG/ML IJ SOLN: 6.2500 mg | INTRAMUSCULAR | Status: DC | PRN

## 2021-08-10 MED ORDER — FENTANYL CITRATE (PF) 100 MCG/2ML IJ SOLN
INTRAMUSCULAR | Status: AC
  Filled 2021-08-10: qty 2

## 2021-08-10 MED ORDER — PROPOFOL 10 MG/ML IV BOLUS
INTRAVENOUS | Status: AC
  Filled 2021-08-10: qty 20

## 2021-08-10 MED ORDER — ONDANSETRON HCL 4 MG/2ML IJ SOLN
INTRAMUSCULAR | Status: DC | PRN
  Administered 2021-08-10: 4 mg via INTRAVENOUS

## 2021-08-10 MED ORDER — OXYCODONE HCL 5 MG PO TABS: 5.0000 mg | ORAL_TABLET | Freq: Once | ORAL | Status: DC | PRN

## 2021-08-10 MED ORDER — LIDOCAINE-EPINEPHRINE 1 %-1:100000 IJ SOLN
INTRAMUSCULAR | Status: AC
  Filled 2021-08-10: qty 1

## 2021-08-10 MED ORDER — OXYCODONE HCL 5 MG/5ML PO SOLN: 5.0000 mg | Freq: Once | ORAL | Status: DC | PRN

## 2021-08-10 MED ORDER — CHLORHEXIDINE GLUCONATE 0.12 % MT SOLN: 15.0000 mL | Freq: Once | OROMUCOSAL | Status: AC

## 2021-08-10 MED ORDER — ORAL CARE MOUTH RINSE: 15.0000 mL | Freq: Once | OROMUCOSAL | Status: AC

## 2021-08-10 MED ORDER — PROPOFOL 500 MG/50ML IV EMUL
INTRAVENOUS | Status: DC | PRN
  Administered 2021-08-10: 100 ug/kg/min via INTRAVENOUS

## 2021-08-10 MED ORDER — DEXAMETHASONE SODIUM PHOSPHATE 10 MG/ML IJ SOLN
INTRAMUSCULAR | Status: DC | PRN
  Administered 2021-08-10: 10 mg via INTRAVENOUS

## 2021-08-10 MED ORDER — SUCCINYLCHOLINE CHLORIDE 200 MG/10ML IV SOSY
PREFILLED_SYRINGE | INTRAVENOUS | Status: AC
  Filled 2021-08-10: qty 10

## 2021-08-10 MED ORDER — ROCURONIUM BROMIDE 10 MG/ML (PF) SYRINGE
PREFILLED_SYRINGE | INTRAVENOUS | Status: AC
  Filled 2021-08-10: qty 10

## 2021-08-10 MED ORDER — MEPERIDINE HCL 25 MG/ML IJ SOLN: 6.2500 mg | INTRAMUSCULAR | Status: DC | PRN

## 2021-08-10 MED ORDER — ROCURONIUM BROMIDE 100 MG/10ML IV SOLN
INTRAVENOUS | Status: DC | PRN
  Administered 2021-08-10: 30 mg via INTRAVENOUS

## 2021-08-10 MED ORDER — PHENYLEPHRINE HCL (PRESSORS) 10 MG/ML IV SOLN
INTRAVENOUS | Status: DC | PRN
  Administered 2021-08-10: 100 ug via INTRAVENOUS

## 2021-08-10 MED ORDER — SUGAMMADEX SODIUM 200 MG/2ML IV SOLN
INTRAVENOUS | Status: DC | PRN
  Administered 2021-08-10: 200 mg via INTRAVENOUS

## 2021-08-10 MED ORDER — ONDANSETRON HCL 4 MG/2ML IJ SOLN
INTRAMUSCULAR | Status: AC
  Filled 2021-08-10: qty 2

## 2021-08-10 MED ORDER — FENTANYL CITRATE (PF) 100 MCG/2ML IJ SOLN
INTRAMUSCULAR | Status: DC | PRN
  Administered 2021-08-10: 25 ug via INTRAVENOUS
  Administered 2021-08-10: 50 ug via INTRAVENOUS
  Administered 2021-08-10: 25 ug via INTRAVENOUS

## 2021-08-10 MED ORDER — LIDOCAINE HCL (CARDIAC) PF 100 MG/5ML IV SOSY
PREFILLED_SYRINGE | INTRAVENOUS | Status: DC | PRN
  Administered 2021-08-10: 100 mg via INTRAVENOUS

## 2021-08-10 MED ORDER — PROPOFOL 10 MG/ML IV BOLUS
INTRAVENOUS | Status: DC | PRN
  Administered 2021-08-10: 120 mg via INTRAVENOUS

## 2021-08-10 MED ORDER — LACTATED RINGERS IV SOLN: INTRAVENOUS | Status: DC

## 2021-08-10 MED ORDER — MIDAZOLAM HCL 2 MG/2ML IJ SOLN
INTRAMUSCULAR | Status: AC
  Filled 2021-08-10: qty 2

## 2021-08-10 MED ORDER — 0.9 % SODIUM CHLORIDE (POUR BTL) OPTIME
TOPICAL | Status: DC | PRN
  Administered 2021-08-10: 1 mL

## 2021-08-10 MED ORDER — DEXAMETHASONE SODIUM PHOSPHATE 10 MG/ML IJ SOLN
INTRAMUSCULAR | Status: AC
  Filled 2021-08-10: qty 1

## 2021-08-10 SURGICAL SUPPLY — 35 items
APL FBRTP 3 NS LF CTTN WD (MISCELLANEOUS) ×2
APPLICATOR COTTON TIP 3IN (MISCELLANEOUS) ×3 IMPLANT
BLADE SURG 15 STRL LF DISP TIS (BLADE) ×2 IMPLANT
BLADE SURG 15 STRL SS (BLADE) ×3
CNTNR URN SCR LID CUP LEK RST (MISCELLANEOUS) ×2 IMPLANT
COAG SUCT 10F 3.5MM HAND CTRL (MISCELLANEOUS) ×3 IMPLANT
COAGULATOR SUCT 8FR VV (MISCELLANEOUS) ×3 IMPLANT
CONT SPEC 4OZ STRL OR WHT (MISCELLANEOUS) ×3
COVER BACK TABLE REUSABLE LG (DRAPES) ×3 IMPLANT
CUP MEDICINE 2OZ PLAST GRAD ST (MISCELLANEOUS) ×3 IMPLANT
DEFOGGER ANTIFOG KIT (MISCELLANEOUS) ×3 IMPLANT
DRSG TELFA 4X3 1S NADH ST (GAUZE/BANDAGES/DRESSINGS) ×3 IMPLANT
ELECT CAUTERY BLADE 6.4 (BLADE) ×3 IMPLANT
ELECT CAUTERY NEEDLE 2.0 MIC (NEEDLE) ×3 IMPLANT
ELECT REM PT RETURN 9FT ADLT (ELECTROSURGICAL) ×3
ELECTRODE REM PT RTRN 9FT ADLT (ELECTROSURGICAL) ×2 IMPLANT
GAUZE 4X4 16PLY ~~LOC~~+RFID DBL (SPONGE) ×3 IMPLANT
GLOVE SURG ENC MOIS LTX SZ7.5 (GLOVE) ×3 IMPLANT
GOWN STRL REUS W/ TWL LRG LVL3 (GOWN DISPOSABLE) ×4 IMPLANT
GOWN STRL REUS W/TWL LRG LVL3 (GOWN DISPOSABLE) ×6
MANIFOLD NEPTUNE II (INSTRUMENTS) ×3 IMPLANT
NDL SAFETY ECLIPSE 18X1.5 (NEEDLE) ×2 IMPLANT
NEEDLE FILTER BLUNT 18X 1/2SAF (NEEDLE) ×1
NEEDLE FILTER BLUNT 18X1 1/2 (NEEDLE) ×2 IMPLANT
NEEDLE HYPO 18GX1.5 SHARP (NEEDLE) ×3
NEEDLE HYPO 25GX1X1/2 BEV (NEEDLE) ×3 IMPLANT
NS IRRIG 500ML POUR BTL (IV SOLUTION) ×3 IMPLANT
PACK HEAD/NECK (MISCELLANEOUS) ×3 IMPLANT
PATTIES SURGICAL .5 X.5 (GAUZE/BANDAGES/DRESSINGS) ×3 IMPLANT
SUT SILK 2 0 SH (SUTURE) ×6 IMPLANT
SUT VIC AB 4-0 RB1 18 (SUTURE) ×3 IMPLANT
SYR 10ML LL (SYRINGE) ×3 IMPLANT
TOWEL OR 17X26 4PK STRL BLUE (TOWEL DISPOSABLE) ×3 IMPLANT
TUBING CONNECTING 10 (TUBING) ×3 IMPLANT
WATER STERILE IRR 500ML POUR (IV SOLUTION) ×3 IMPLANT

## 2021-08-10 NOTE — Anesthesia Postprocedure Evaluation (Signed)
Anesthesia Post Note  Patient: Rick Mcbride.  Procedure(s) Performed: MICRODIRECT LARYNGOSCOPY WITH BIOPSY OF TONGUE BASE (Right) TONGUE BIOPSY  Patient location during evaluation: PACU Anesthesia Type: General Level of consciousness: awake and alert and oriented Pain management: pain level controlled Vital Signs Assessment: post-procedure vital signs reviewed and stable Respiratory status: spontaneous breathing, nonlabored ventilation and respiratory function stable Cardiovascular status: blood pressure returned to baseline and stable Postop Assessment: no signs of nausea or vomiting Anesthetic complications: no   No notable events documented.   Last Vitals:  Vitals:   08/10/21 1043 08/10/21 1102  BP: (!) 168/83 (!) 155/99  Pulse:  (!) 59  Resp:  18  Temp:    SpO2:  100%    Last Pain:  Vitals:   08/10/21 1102  TempSrc:   PainSc: 0-No pain                 Anyae Griffith

## 2021-08-10 NOTE — Anesthesia Preprocedure Evaluation (Signed)
Anesthesia Evaluation  Patient identified by MRN, date of birth, ID band Patient awake    Reviewed: Allergy & Precautions, NPO status , Patient's Chart, lab work & pertinent test results  History of Anesthesia Complications Negative for: history of anesthetic complications  Airway Mallampati: III  TM Distance: >3 FB Neck ROM: Full    Dental  (+) Edentulous Upper, Edentulous Lower   Pulmonary neg sleep apnea, COPD, former smoker,    breath sounds clear to auscultation- rhonchi (-) wheezing      Cardiovascular hypertension, Pt. on medications (-) CAD, (-) Past MI, (-) Cardiac Stents and (-) CABG + pacemaker (placed for bradycardia)  Rhythm:Regular Rate:Normal - Systolic murmurs and - Diastolic murmurs    Neuro/Psych  Headaches, Anxiety CVA, No Residual Symptoms    GI/Hepatic Neg liver ROS, GERD  ,  Endo/Other  negative endocrine ROSneg diabetes  Renal/GU CRFRenal disease     Musculoskeletal  (+) Arthritis ,   Abdominal (+) + obese,   Peds  Hematology  (+) anemia ,   Anesthesia Other Findings Past Medical History: No date: AAA (abdominal aortic aneurysm)     Comment:  a.) s/p EVAR 01/05/2017. b.) native aneurysm sac 6.6 x               6.9 cm by CT on 03/31/2021 No date: Abnormality of tongue     Comment:  a.) CT head/neck 07/10/2021 --> asymmetric soft tissue               at the RIGHT tongue base with a superficial 8 mm lesion. No date: Anemia No date: Aneurysm of right common iliac artery (HCC)     Comment:  a.) measured 2.7 cm by CT on 03/31/2021 No date: Anxiety No date: Aortic atherosclerosis (HCC) No date: Atrophic kidney No date: B12 deficiency No date: CAD (coronary artery disease) No date: Cervical radiculopathy No date: Chronic airway obstruction (HCC) No date: Chronic kidney disease (CKD), stage III (moderate) (HCC) 06/16/2021: Chronic pain syndrome 11/12/2019: Chronic right shoulder pain No  date: Chronic tension headaches 06/16/2021: Chronic, continuous use of opioids No date: Coronary artery disease No date: DDD (degenerative disc disease), lumbar No date: Degenerative disc disease, lumbar     Comment:  with lumbar radiculopathy No date: Elbow fracture, left No date: GERD (gastroesophageal reflux disease) No date: H/O adenomatous polyp of colon No date: H/O hemorrhoids No date: HTN (hypertension) No date: Hyperlipidemia No date: Meralgia paresthetica No date: Mild cognitive impairment No date: Nephrolithiasis No date: Neuralgia 02/19/2021: Numbness of right foot No date: Osteoarthritis No date: Pars defect of lumbar spine     Comment:  L5 bilat w/anteriolisthesis No date: Presence of permanent cardiac pacemaker No date: Prostate cancer ([place])     Comment:  a.) s/p prostatectomy 05/01/2004: S/P CABG x 3     Comment:  a.) LVEF 40-49%; LIMA-LAD, SVG-OM1, SVG-PDA No date: Second degree AV block No date: Sinoatrial node dysfunction ([place]) 01/31/2013: 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[place])     Comment:  a.) RLL squamous cell carcinoma No date: Status post partial lobectomy of lung     Comment:  a.) s/p RLL resection on 01/19/2013 No date: Stroke [facility]) No date: TIA (transient ischemic attack) No date: Valvular regurgitation     Comment:  a.) TTE 10/24/2019 --> LVEF 45-50%; trivial TR, mild AR               and MR; moderate LA dilitation.   Reproductive/Obstetrics  Anesthesia Physical Anesthesia Plan  ASA: 3  Anesthesia Plan: General   Post-op Pain Management:    Induction: Intravenous  PONV Risk Score and Plan: 1 and Ondansetron and Dexamethasone  Airway Management Planned: Oral ETT  Additional Equipment:   Intra-op Plan:   Post-operative Plan: Extubation in OR  Informed Consent: I have reviewed the patients History and Physical, chart, labs and discussed the procedure including the risks,  benefits and alternatives for the proposed anesthesia with the patient or authorized representative who has indicated his/her understanding and acceptance.     Dental advisory given  Plan Discussed with: CRNA and Anesthesiologist  Anesthesia Plan Comments:         Anesthesia Quick Evaluation

## 2021-08-10 NOTE — Transfer of Care (Signed)
Immediate Anesthesia Transfer of Care Note  Patient: Rick Mcbride.  Procedure(s) Performed: MICRODIRECT LARYNGOSCOPY WITH BIOPSY OF TONGUE BASE (Right) TONGUE BIOPSY  Patient Location: PACU  Anesthesia Type:General  Level of Consciousness: awake, alert  and oriented  Airway & Oxygen Therapy: Patient Spontanous Breathing and Patient connected to face mask oxygen  Post-op Assessment: Report given to RN and Post -op Vital signs reviewed and stable  Post vital signs: Reviewed and stable  Last Vitals:  Vitals Value Taken Time  BP 174/87 08/10/21 0951  Temp    Pulse 72 08/10/21 0953  Resp 15 08/10/21 0953  SpO2 100 % 08/10/21 0953  Vitals shown include unvalidated device data.  Last Pain:  Vitals:   08/10/21 0758  TempSrc: Temporal  PainSc: 2          Complications: No notable events documented.

## 2021-08-10 NOTE — Anesthesia Procedure Notes (Signed)
Procedure Name: Intubation Date/Time: 08/10/2021 9:23 AM Performed by: Guadlupe Spanish, RN Pre-anesthesia Checklist: Patient identified, Emergency Drugs available, Suction available and Patient being monitored Patient Re-evaluated:Patient Re-evaluated prior to induction Oxygen Delivery Method: Circle system utilized Preoxygenation: Pre-oxygenation with 100% oxygen Induction Type: IV induction Ventilation: Mask ventilation without difficulty Laryngoscope Size: McGraph and 4 Grade View: Grade I Tube type: Oral Tube size: 6.0 mm Number of attempts: 1 Airway Equipment and Method: Stylet, Oral airway and Video-laryngoscopy Placement Confirmation: ETT inserted through vocal cords under direct vision, positive ETCO2 and breath sounds checked- equal and bilateral Tube secured with: Tape Dental Injury: Teeth and Oropharynx as per pre-operative assessment

## 2021-08-10 NOTE — H&P (Signed)
The patient's history has been reviewed, patient examined, no change in status, stable for surgery.  Questions were answered to the patients satisfaction.  

## 2021-08-10 NOTE — Op Note (Signed)
08/10/2021  9:48 AM    Rick Mcbride, Rick Mcbride  [id number]   Pre-Op Dx: Neoplasm uncertain behavior tongue  Post-op Dx: SAME  Proc: Direct laryngoscopy with biopsy tongue base addendum to previous note  Surg:  [name]  Anes:  GOT  EBL:    Comp:    Findings: Note should read exophytic instead of cephalic right tongue base mass  Procedure:   Dispo:     Plan:    [name]  08/10/2021 9:48 AM

## 2021-08-10 NOTE — Discharge Instructions (Signed)
AMBULATORY SURGERY  ?DISCHARGE INSTRUCTIONS ? ? ?The drugs that you were given will stay in your system until tomorrow so for the next 24 hours you should not: ? ?Drive an automobile ?Make any legal decisions ?Drink any alcoholic beverage ? ? ?You may resume regular meals tomorrow.  Today it is better to start with liquids and gradually work up to solid foods. ? ?You may eat anything you prefer, but it is better to start with liquids, then soup and crackers, and gradually work up to solid foods. ? ? ?Please notify your doctor immediately if you have any unusual bleeding, trouble breathing, redness and pain at the surgery site, drainage, fever, or pain not relieved by medication. ? ? ? ?Additional Instructions: ? ? ? ?Please contact your physician with any problems or Same Day Surgery at [phone], Monday through Friday 6 am to 4 pm, or [place] [facility] number at [phone].  ?

## 2021-08-10 NOTE — Op Note (Signed)
08/10/2021  9:43 AM    Gery Pray, Lamount Cranker  [id number]   Pre-Op Dx: Neoplasm uncertain behavior tongue  Post-op Dx: SAME  Proc: Direct laryngoscopy with biopsy right tongue base  Surg:  [name]  Anes:  GOT  EBL: Less than 5 cc  Comp: None  Findings: Cephalic mass right tongue base approximately 2.5 x 3 cm  Procedure: Mr. Tomas was identified in the holding area take the operating placed in supine position.  After general endotracheal anesthesia the table is turned 90 degrees.  The patient was edentulous.  A soft gauze was placed against the maxilla and a Dedo laryngoscope was introduced into the airway examination of the tongue base showed an exophytic mass on the right lateral portion of the tongue base in the vallecula.  Examination of the vocal folds showed no evidence of tumor mass epiglottis was normal the middle and left tongue base were clear both piriform sinuses were clear the right tongue base mass was then readdressed multiple biopsies were taken using 45 degree laryngeal biopsy forceps.  Most were sent for permanent section some were sent for fresh section to rule out lymphoma.  With biopsies completed there was minimal bleeding the patient was returned to anesthesia where he was awakened in the operating type cart room in stable condition.  Dispo:   Good  Plan: Discharged home I will follow-up in my office in 2-week I will call with results of the pathology reports as they become available.  [name]  08/10/2021 9:43 AM

## 2021-08-10 NOTE — OR Nursing (Signed)
Pt can Resume his plavix on Wednesday, per MD Tami Ribas. Continue to monitor

## 2021-08-11 LAB — SURGICAL PATHOLOGY

## 2021-08-13 ENCOUNTER — Ambulatory Visit: Payer: PPO

## 2021-08-17 ENCOUNTER — Ambulatory Visit: Payer: PPO

## 2021-08-19 ENCOUNTER — Ambulatory Visit: Payer: PPO

## 2021-08-24 ENCOUNTER — Ambulatory Visit: Payer: PPO

## 2021-08-24 DIAGNOSIS — C01 Malignant neoplasm of base of tongue: Secondary | ICD-10-CM | POA: Diagnosis not present

## 2021-08-24 DIAGNOSIS — J300 Vasomotor rhinitis: Secondary | ICD-10-CM | POA: Diagnosis not present

## 2021-08-26 ENCOUNTER — Other Ambulatory Visit: Payer: Self-pay | Admitting: Unknown Physician Specialty

## 2021-08-26 ENCOUNTER — Ambulatory Visit: Payer: PPO

## 2021-08-26 DIAGNOSIS — C01 Malignant neoplasm of base of tongue: Secondary | ICD-10-CM

## 2021-08-28 DIAGNOSIS — C01 Malignant neoplasm of base of tongue: Secondary | ICD-10-CM | POA: Insufficient documentation

## 2021-08-28 NOTE — Progress Notes (Signed)
Rick Mcbride  Telephone:([phone] [phone]  ID: [name] 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  INTERVAL HISTORY: Patient is an 80 year old male who had several episodes of hemoptysis and was subsequently noted to have new asymmetry at the base of his right tongue.  Imaging and biopsy revealed the above-stated malignancy.  He currently feels well and is asymptomatic.  He has no further episodes of hemoptysis.  He denies any pain or dysphagia.  He has no neurologic complaints.  He denies any recent fevers or illnesses.  He has a good appetite and denies weight loss.  He has no chest pain, shortness of breath, cough, or hemoptysis.  He denies any nausea, vomiting, constipation, or diarrhea.  He has no urinary complaints.  Patient feels at his baseline offers no specific complaints today.  REVIEW OF SYSTEMS:   Review of Systems  Constitutional: Negative.  Negative for fever, malaise/fatigue and weight loss.  Respiratory: Negative.  Negative for cough, hemoptysis and shortness of breath.   Cardiovascular: Negative.  Negative for chest pain and leg swelling.  Gastrointestinal: Negative.  Negative for abdominal pain.  Genitourinary: Negative.  Negative for dysuria and hematuria.  Musculoskeletal: Negative.  Negative for back pain.  Skin: Negative.  Negative for rash.  Neurological: Negative.  Negative for dizziness, focal weakness, weakness and headaches.  Psychiatric/Behavioral: Negative.  The patient is not nervous/anxious.    As per HPI. Otherwise, a complete review of systems is negative.  PAST MEDICAL HISTORY: Past Medical History:  Diagnosis Date   AAA (abdominal aortic aneurysm)    a.) s/p EVAR 01/05/2017. b.) native aneurysm sac 6.6 x 6.9 cm by CT on 03/31/2021   Abnormality of tongue     a.) CT head/neck 07/10/2021 --> asymmetric soft tissue at the RIGHT tongue base with a superficial 8 mm lesion.   Anemia    Aneurysm of right common iliac artery (HCC)    a.) measured 2.7 cm by CT on 03/31/2021   Anxiety    Aortic atherosclerosis (HCC)    Atrophic kidney    B12 deficiency    CAD (coronary artery disease)    Cervical radiculopathy    Chronic airway obstruction (HCC)    Chronic kidney disease (CKD), stage III (moderate) (HCC)    Chronic pain syndrome 06/16/2021   Chronic right shoulder pain 11/12/2019   Chronic tension headaches    Chronic, continuous use of opioids 06/16/2021   Coronary artery disease    DDD (degenerative disc disease), lumbar    Degenerative disc disease, lumbar    with lumbar radiculopathy   Elbow fracture, left    GERD (gastroesophageal reflux disease)    H/O adenomatous polyp of colon    H/O hemorrhoids    HTN (hypertension)    Hyperlipidemia    Meralgia paresthetica    Mild cognitive impairment    Nephrolithiasis    Neuralgia    Numbness of right foot 02/19/2021   Osteoarthritis    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   Presence of permanent cardiac pacemaker    Prostate cancer ([place])    a.) s/p prostatectomy   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    PAST SURGICAL HISTORY: Past Surgical History:  Procedure Laterality Date   CATARACT EXTRACTION Bilateral    COLONOSCOPY     COLONOSCOPY     COLONOSCOPY WITH PROPOFOL N/A 10/20/2015   Procedure: COLONOSCOPY WITH PROPOFOL;   Surgeon: Manya Silvas, MD;  Location: [facility] ENDOSCOPY;  Service: Endoscopy;  Laterality: N/A;   COLONOSCOPY WITH PROPOFOL N/A 11/28/2020   Procedure: COLONOSCOPY WITH PROPOFOL;  Surgeon: Robert Bellow, MD;  Location: [facility] ENDOSCOPY;  Service: Endoscopy;  Laterality: N/A;   CORONARY ARTERY BYPASS GRAFT N/A 05/01/2004   Procedure: 3v CABG (LIMA-LAD, SVG-OM1, SVG-PDA); Location: [facility]; Surgeon: Ander Gaster, MD   EMBOLIZATION Right 12/27/2016   Procedure: Embolization;  Surgeon: Algernon Huxley, MD;  Location: [place] CV LAB;  Service: Cardiovascular;  Laterality: Right;   ENDOVASCULAR REPAIR/STENT GRAFT N/A 01/05/2017   Procedure: Endovascular Repair/Stent Graft;  Surgeon: Algernon Huxley, MD;  Location: [place] [facility];  Service: Cardiovascular;  Laterality: N/A;   INSERT / REPLACE / REMOVE PACEMAKER     JOINT REPLACEMENT     shoulder and knees   KNEE ARTHROSCOPY     LUNG LOBECTOMY Right 01/31/2013   Procedure: RIGHT PULMONARY LOBECTOMY; Location: [facility]; Surgeon: Nestor Lewandowsky, MD   MICROLARYNGOSCOPY Right 08/10/2021   Procedure: MICRODIRECT LARYNGOSCOPY WITH BIOPSY OF TONGUE BASE;  Surgeon: Beverly Gust, MD;  Location: [facility] ORS;  Service: ENT;  Laterality: Right;   PACEMAKER INSERTION  12/2012   Dual chanber pacemaker generator   POLYPECTOMY     POSTERIOR LUMBAR FUSION     Procedure: POSTERIOR LUMBAR INTERBODY FUSION, INTERBODY PROSTHESIS, POSTERIOR LATERAL ARTHRODESIS, POSTERIOR NON-SEGMENTAL INSTRUMENTATION LUMBAR FIVE- SACRAL ONE; Location: [facility]; Surgeon: Newman Pies, MD   PROSTATECTOMY     TONGUE BIOPSY N/A 08/10/2021   Procedure: TONGUE BIOPSY;  Surgeon: Beverly Gust, MD;  Location: [facility] ORS;  Service: ENT;  Laterality: N/A;   TOTAL KNEE ARTHROPLASTY Bilateral    TOTAL SHOULDER ARTHROPLASTY Left 07/10/2015   Procedure: TOTAL SHOULDER ARTHROPLASTY;  Surgeon: [name], MD;  Location: [facility] ORS;  Service: Orthopedics;  Laterality: Left;   TOTAL  SHOULDER REPLACEMENT      FAMILY HISTORY: Family History  Problem Relation Age of Onset   Heart attack Mother    Heart attack Father    Breast cancer Sister    Asthma Sister     ADVANCED DIRECTIVES (Y/N):  N  HEALTH MAINTENANCE: Social History   Tobacco Use   Smoking status: Former    Packs/day: 1.50    Years: 45.00    Pack years: 67.50    Types: Cigarettes    Quit date: 04/07/2004    Years since quitting: 17.4   Smokeless tobacco: Never  Vaping Use   Vaping Use: Never used  Substance Use Topics   Alcohol use: No   Drug use: No     Colonoscopy:  PAP:  Bone density:  Lipid panel:  No Known Allergies  Current Outpatient Medications  Medication Sig Dispense Refill   acetaminophen (TYLENOL) 500 MG tablet Take 500 mg by mouth every 4 (four) hours as needed for moderate pain.  amLODipine (NORVASC) 5 MG tablet Take 1 tablet (5 mg total) by mouth daily. 7 tablet 0   ascorbic acid (VITAMIN C) 1000 MG tablet Take 1,000 mg by mouth daily.     aspirin EC 81 MG tablet Take 81 mg by mouth daily.      atorvastatin (LIPITOR) 20 MG tablet Take 40 mg by mouth at bedtime.     Calcium Carb-Cholecalciferol (CALCIUM 600 + D PO) Take 1 tablet by mouth daily.     cetirizine (ZYRTEC) 10 MG tablet Take 10 mg by mouth daily.     clopidogrel (PLAVIX) 75 MG tablet Take 75 mg by mouth daily.      clotrimazole (LOTRIMIN) 1 % cream Apply 1 application topically 2 (two) times daily as needed (athletes foot).     docusate sodium (COLACE) 100 MG capsule Take 300 mg by mouth at bedtime.     donepezil (ARICEPT) 5 MG tablet Take 5 mg by mouth at bedtime.     Ferrous Sulfate Dried (SLOW RELEASE IRON) 45 MG TBCR Take 45 mg by mouth 2 (two) times daily.     ipratropium (ATROVENT) 0.03 % nasal spray Place 2 sprays into both nostrils 2 (two) times daily as needed for rhinitis.     losartan (COZAAR) 100 MG tablet Take 100 mg by mouth daily.     Melatonin 10 MG TABS Take 10 mg by mouth at bedtime.       methocarbamol (ROBAXIN) 500 MG tablet Take 500 mg by mouth at bedtime.     metoprolol succinate (TOPROL-XL) 25 MG 24 hr tablet Take 25 mg by mouth daily.     Multiple Vitamin (MULTIVITAMIN WITH MINERALS) TABS tablet Take 1 tablet by mouth daily.      Multiple Vitamins-Minerals (PRESERVISION AREDS 2) CAPS Take 1 tablet by mouth 2 (two) times daily.     pantoprazole (PROTONIX) 40 MG tablet Take 40 mg by mouth daily.     polyethylene glycol (MIRALAX / GLYCOLAX) 17 g packet Take 17 g by mouth daily.     Probiotic Product (PROBIOTIC PO) Take 1 capsule by mouth daily.     traMADol (ULTRAM) 50 MG tablet Take 25 mg by mouth every 4 (four) hours as needed for moderate pain.     vitamin B-12 (CYANOCOBALAMIN) 1000 MCG tablet Take 1,000 mcg by mouth daily.     zolpidem (AMBIEN) 10 MG tablet Take 10 mg by mouth at bedtime.     ezetimibe (ZETIA) 10 MG tablet Take 10 mg by mouth daily.     No current facility-administered medications for this visit.    OBJECTIVE: Vitals:   09/01/21 1509  BP: (!) 149/74  Pulse: 65  Resp: 18  Temp: 97.9 F (36.6 C)  SpO2: 98%     Body mass index is 30.14 kg/m.    ECOG FS:0 - Asymptomatic  General: Well-developed, well-nourished, no acute distress. Eyes: Pink conjunctiva, anicteric sclera. HEENT: Normocephalic, moist mucous membranes.  No palpable lymphadenopathy. Lungs: No audible wheezing or coughing. Heart: Regular rate and rhythm. Abdomen: Soft, nontender, no obvious distention. Musculoskeletal: No edema, cyanosis, or clubbing. Neuro: Alert, answering all questions appropriately. Cranial nerves grossly intact. Skin: No rashes or petechiae noted. Psych: Normal affect. Lymphatics: No cervical, calvicular, axillary or inguinal LAD.   LAB RESULTS:  Lab Results  Component Value Date   NA 138 10/12/2018   K 3.9 10/12/2018   CL 108 10/12/2018   CO2 22 10/12/2018   GLUCOSE 127 (H) 10/12/2018   BUN 24 (  H) 10/12/2018   CREATININE 2.50 (H) 07/10/2021    CALCIUM 8.8 (L) 10/12/2018   PROT 6.7 10/12/2018   ALBUMIN 3.8 10/12/2018   AST 16 10/12/2018   ALT 16 10/12/2018   ALKPHOS 63 10/12/2018   BILITOT 0.7 10/12/2018   GFRNONAA 31 (L) 10/12/2018   GFRAA 36 (L) 10/12/2018    Lab Results  Component Value Date   WBC 8.1 10/12/2018   NEUTROABS 6.2 10/12/2018   HGB 13.0 10/12/2018   HCT 40.2 10/12/2018   MCV 90.7 10/12/2018   PLT 140 (L) 10/12/2018     STUDIES: No results found.  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  PET scan is scheduled for next week.  Patient will likely require XRT only for treatment.  Given his advanced age and renal insufficiency, it is unclear if chemotherapy would offer benefit if needed.  Return to clinic in 1 week after his PET scan to discuss the results and treatment planning.  Patient will also have consultation with radiation oncology on that day. Chronic renal insufficiency: Patient's most recent creatinine is 2.5 which appears to be approximately his baseline. History lung cancer: Patient had lobectomy and did not require any further treatment.  No evidence of disease.  I spent a total of 60 minutes reviewing chart data, face-to-face evaluation with the patient, counseling and coordination of care as detailed above.   Patient expressed understanding and was in agreement with this plan. He also understands that He can call clinic at any time with any questions, concerns, or complaints.   Cancer Staging Cancer of lung [facility]) Staging form: Lung, AJCC 7th Edition - Clinical: Stage IA (T1a, N0, M0) - Signed by Lloyd Huger, MD on 09/01/2021 Laterality: Right   Lloyd Huger, MD   09/01/2021 6:46 PM

## 2021-08-31 ENCOUNTER — Ambulatory Visit: Payer: PPO

## 2021-09-01 ENCOUNTER — Other Ambulatory Visit: Payer: Self-pay

## 2021-09-01 ENCOUNTER — Inpatient Hospital Stay: Payer: PPO | Attending: Oncology | Admitting: Oncology

## 2021-09-01 ENCOUNTER — Inpatient Hospital Stay: Payer: PPO

## 2021-09-01 DIAGNOSIS — Z85118 Personal history of other malignant neoplasm of bronchus and lung: Secondary | ICD-10-CM | POA: Insufficient documentation

## 2021-09-01 DIAGNOSIS — Z803 Family history of malignant neoplasm of breast: Secondary | ICD-10-CM | POA: Diagnosis not present

## 2021-09-01 DIAGNOSIS — N189 Chronic kidney disease, unspecified: Secondary | ICD-10-CM | POA: Insufficient documentation

## 2021-09-01 DIAGNOSIS — Z87891 Personal history of nicotine dependence: Secondary | ICD-10-CM | POA: Insufficient documentation

## 2021-09-01 DIAGNOSIS — C01 Malignant neoplasm of base of tongue: Secondary | ICD-10-CM | POA: Insufficient documentation

## 2021-09-01 NOTE — Progress Notes (Signed)
Pt has no concerns/complaints at this time. 

## 2021-09-02 ENCOUNTER — Ambulatory Visit: Payer: PPO

## 2021-09-07 ENCOUNTER — Ambulatory Visit: Payer: PPO

## 2021-09-07 DIAGNOSIS — R319 Hematuria, unspecified: Secondary | ICD-10-CM | POA: Diagnosis not present

## 2021-09-07 DIAGNOSIS — N184 Chronic kidney disease, stage 4 (severe): Secondary | ICD-10-CM | POA: Diagnosis not present

## 2021-09-07 DIAGNOSIS — D631 Anemia in chronic kidney disease: Secondary | ICD-10-CM | POA: Diagnosis not present

## 2021-09-07 DIAGNOSIS — N2581 Secondary hyperparathyroidism of renal origin: Secondary | ICD-10-CM | POA: Diagnosis not present

## 2021-09-07 DIAGNOSIS — I129 Hypertensive chronic kidney disease with stage 1 through stage 4 chronic kidney disease, or unspecified chronic kidney disease: Secondary | ICD-10-CM | POA: Diagnosis not present

## 2021-09-07 DIAGNOSIS — R809 Proteinuria, unspecified: Secondary | ICD-10-CM | POA: Diagnosis not present

## 2021-09-09 ENCOUNTER — Ambulatory Visit: Payer: PPO

## 2021-09-09 ENCOUNTER — Ambulatory Visit
Admission: RE | Admit: 2021-09-09 | Discharge: 2021-09-09 | Disposition: A | Discharge status: Home / Self Care | Payer: PPO | Source: Ambulatory Visit | Attending: Unknown Physician Specialty | Admitting: Unknown Physician Specialty

## 2021-09-09 ENCOUNTER — Other Ambulatory Visit: Payer: Self-pay

## 2021-09-09 DIAGNOSIS — Z131 Encounter for screening for diabetes mellitus: Secondary | ICD-10-CM | POA: Insufficient documentation

## 2021-09-09 DIAGNOSIS — N200 Calculus of kidney: Secondary | ICD-10-CM | POA: Diagnosis not present

## 2021-09-09 DIAGNOSIS — I700 Atherosclerosis of aorta: Secondary | ICD-10-CM | POA: Diagnosis not present

## 2021-09-09 DIAGNOSIS — C01 Malignant neoplasm of base of tongue: Secondary | ICD-10-CM | POA: Insufficient documentation

## 2021-09-09 DIAGNOSIS — J439 Emphysema, unspecified: Secondary | ICD-10-CM | POA: Insufficient documentation

## 2021-09-09 DIAGNOSIS — I7789 Other specified disorders of arteries and arterioles: Secondary | ICD-10-CM | POA: Diagnosis not present

## 2021-09-09 DIAGNOSIS — I2510 Atherosclerotic heart disease of native coronary artery without angina pectoris: Secondary | ICD-10-CM | POA: Diagnosis not present

## 2021-09-09 LAB — GLUCOSE, CAPILLARY: Glucose-Capillary: 88 mg/dL (ref 70–99)

## 2021-09-09 MED ORDER — FLUDEOXYGLUCOSE F - 18 (FDG) INJECTION
11.2000 | Freq: Once | INTRAVENOUS | Status: AC | PRN
  Administered 2021-09-09: 11.62 via INTRAVENOUS

## 2021-09-10 NOTE — Progress Notes (Signed)
Rick Mcbride  [phone] [phone]  ID: [name] 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  INTERVAL HISTORY: Patient returns to clinic today for further evaluation, discussion of his imaging results and treatment planning.  He currently feels well and is asymptomatic.  He denies any pain or dysphagia.  He has no neurologic complaints.  He denies any recent fevers or illnesses.  He has a good appetite and denies weight loss.  He has no chest pain, shortness of breath, cough, or hemoptysis.  He denies any nausea, vomiting, constipation, or diarrhea.  He has no urinary complaints.  Patient offers no specific complaints today. Room REVIEW OF SYSTEMS:   Review of Systems  Constitutional: Negative.  Negative for fever, malaise/fatigue and weight loss.  Respiratory: Negative.  Negative for cough, hemoptysis and shortness of breath.   Cardiovascular: Negative.  Negative for chest pain and leg swelling.  Gastrointestinal: Negative.  Negative for abdominal pain.  Genitourinary: Negative.  Negative for dysuria and hematuria.  Musculoskeletal: Negative.  Negative for back pain.  Skin: Negative.  Negative for rash.  Neurological: Negative.  Negative for dizziness, focal weakness, weakness and headaches.  Psychiatric/Behavioral: Negative.  The patient is not nervous/anxious.    As per HPI. Otherwise, a complete review of systems is negative.  PAST MEDICAL HISTORY: Past Medical History:  Diagnosis Date   AAA (abdominal aortic aneurysm)    a.) s/p EVAR 01/05/2017. b.) native aneurysm sac 6.6 x 6.9 cm by CT on 03/31/2021   Abnormality of tongue    a.) CT head/neck 07/10/2021 --> asymmetric soft tissue at the RIGHT tongue base with a superficial 8 mm lesion.   Anemia    Aneurysm of right  common iliac artery (HCC)    a.) measured 2.7 cm by CT on 03/31/2021   Anxiety    Aortic atherosclerosis (HCC)    Atrophic kidney    B12 deficiency    CAD (coronary artery disease)    Cervical radiculopathy    Chronic airway obstruction (HCC)    Chronic kidney disease (CKD), stage III (moderate) (HCC)    Chronic pain syndrome 06/16/2021   Chronic right shoulder pain 11/12/2019   Chronic tension headaches    Chronic, continuous use of opioids 06/16/2021   Coronary artery disease    DDD (degenerative disc disease), lumbar    Degenerative disc disease, lumbar    with lumbar radiculopathy   Elbow fracture, left    GERD (gastroesophageal reflux disease)    H/O adenomatous polyp of colon    H/O hemorrhoids    HTN (hypertension)    Hyperlipidemia    Meralgia paresthetica    Mild cognitive impairment    Nephrolithiasis    Neuralgia    Numbness of right foot 02/19/2021   Osteoarthritis    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   Presence of permanent cardiac pacemaker    Prostate cancer ([place])    a.) s/p prostatectomy   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    PAST SURGICAL HISTORY: Past Surgical History:  Procedure Laterality Date   CATARACT EXTRACTION Bilateral    COLONOSCOPY     COLONOSCOPY     COLONOSCOPY WITH PROPOFOL N/A 10/20/2015   Procedure: COLONOSCOPY WITH PROPOFOL;  Surgeon: Manya Silvas, MD;  Location: [facility] ENDOSCOPY;  Service: Endoscopy;  Laterality: N/A;   COLONOSCOPY WITH PROPOFOL N/A 11/28/2020    Procedure: COLONOSCOPY WITH PROPOFOL;  Surgeon: Robert Bellow, MD;  Location: [facility] ENDOSCOPY;  Service: Endoscopy;  Laterality: N/A;   CORONARY ARTERY BYPASS GRAFT N/A 05/01/2004   Procedure: 3v CABG (LIMA-LAD, SVG-OM1, SVG-PDA); Location: [facility]; Surgeon: Ander Gaster, MD   EMBOLIZATION Right 12/27/2016   Procedure: Embolization;  Surgeon: Algernon Huxley, MD;  Location: [place] CV LAB;  Service: Cardiovascular;  Laterality: Right;   ENDOVASCULAR REPAIR/STENT GRAFT N/A 01/05/2017   Procedure: Endovascular Repair/Stent Graft;  Surgeon: Algernon Huxley, MD;  Location: [place] [facility] LAB;  Service: Cardiovascular;  Laterality: N/A;   INSERT / REPLACE / REMOVE PACEMAKER     JOINT REPLACEMENT     shoulder and knees   KNEE ARTHROSCOPY     LUNG LOBECTOMY Right 01/31/2013   Procedure: RIGHT PULMONARY LOBECTOMY; Location: [place]; Surgeon: Nestor Lewandowsky, MD   MICROLARYNGOSCOPY Right 08/10/2021   Procedure: MICRODIRECT LARYNGOSCOPY WITH BIOPSY OF TONGUE BASE;  Surgeon: Beverly Gust, MD;  Location: [facility] ORS;  Service: ENT;  Laterality: Right;   PACEMAKER INSERTION  12/2012   Dual chanber pacemaker generator   POLYPECTOMY     POSTERIOR LUMBAR FUSION     Procedure: POSTERIOR LUMBAR INTERBODY FUSION, INTERBODY PROSTHESIS, POSTERIOR LATERAL ARTHRODESIS, POSTERIOR NON-SEGMENTAL INSTRUMENTATION LUMBAR FIVE- SACRAL ONE; Location: [facility]; Surgeon: Newman Pies, MD   PROSTATECTOMY     TONGUE BIOPSY N/A 08/10/2021   Procedure: TONGUE BIOPSY;  Surgeon: Beverly Gust, MD;  Location: [facility] ORS;  Service: ENT;  Laterality: N/A;   TOTAL KNEE ARTHROPLASTY Bilateral    TOTAL SHOULDER ARTHROPLASTY Left 07/10/2015   Procedure: TOTAL SHOULDER ARTHROPLASTY;  Surgeon: [name], MD;  Location: [facility] ORS;  Service: Orthopedics;  Laterality: Left;   TOTAL SHOULDER REPLACEMENT      FAMILY HISTORY: Family History  Problem Relation Age of Onset   Heart attack Mother    Heart attack Father    Breast  cancer Sister    Asthma Sister     ADVANCED DIRECTIVES (Y/N):  N  HEALTH MAINTENANCE: Social History   Tobacco Use   Smoking status: Former    Packs/day: 1.50    Years: 45.00    Pack years: 67.50    Types: Cigarettes    Quit date: 04/07/2004    Years since quitting: 17.4   Smokeless tobacco: Never  Vaping Use   Vaping Use: Never used  Substance Use Topics   Alcohol use: No   Drug use: No     Colonoscopy:  PAP:  Bone density:  Lipid panel:  No Known Allergies  Current Outpatient Medications  Medication Sig Dispense Refill   acetaminophen (TYLENOL) 500 MG tablet Take 500 mg by mouth every 4 (four) hours as needed for moderate pain.     amLODipine (NORVASC) 5 MG tablet Take 1 tablet (5 mg total) by mouth daily. 7 tablet 0   ascorbic acid (VITAMIN C) 1000  MG tablet Take 1,000 mg by mouth daily.     aspirin EC 81 MG tablet Take 81 mg by mouth daily.      atorvastatin (LIPITOR) 20 MG tablet Take 40 mg by mouth at bedtime.     Calcium Carb-Cholecalciferol (CALCIUM 600 + D PO) Take 1 tablet by mouth daily.     cetirizine (ZYRTEC) 10 MG tablet Take 10 mg by mouth daily.     clopidogrel (PLAVIX) 75 MG tablet Take 75 mg by mouth daily.      clotrimazole (LOTRIMIN) 1 % cream Apply 1 application topically 2 (two) times daily as needed (athletes foot).     docusate sodium (COLACE) 100 MG capsule Take 300 mg by mouth at bedtime.     donepezil (ARICEPT) 5 MG tablet Take 5 mg by mouth at bedtime.     Ferrous Sulfate Dried (SLOW RELEASE IRON) 45 MG TBCR Take 45 mg by mouth 2 (two) times daily.     ipratropium (ATROVENT) 0.03 % nasal spray Place 2 sprays into both nostrils 2 (two) times daily as needed for rhinitis.     losartan (COZAAR) 100 MG tablet Take 100 mg by mouth daily.     Melatonin 10 MG TABS Take 10 mg by mouth at bedtime.      methocarbamol (ROBAXIN) 500 MG tablet Take 500 mg by mouth at bedtime.     metoprolol succinate (TOPROL-XL) 25 MG 24 hr tablet Take 25 mg by mouth  daily.     Multiple Vitamin (MULTIVITAMIN WITH MINERALS) TABS tablet Take 1 tablet by mouth daily.      Multiple Vitamins-Minerals (PRESERVISION AREDS 2) CAPS Take 1 tablet by mouth 2 (two) times daily.     pantoprazole (PROTONIX) 40 MG tablet Take 40 mg by mouth daily.     polyethylene glycol (MIRALAX / GLYCOLAX) 17 g packet Take 17 g by mouth daily.     Probiotic Product (PROBIOTIC PO) Take 1 capsule by mouth daily.     traMADol (ULTRAM) 50 MG tablet Take 25 mg by mouth every 4 (four) hours as needed for moderate pain.     vitamin B-12 (CYANOCOBALAMIN) 1000 MCG tablet Take 1,000 mcg by mouth daily.     zolpidem (AMBIEN) 10 MG tablet Take 10 mg by mouth at bedtime.     ezetimibe (ZETIA) 10 MG tablet Take 10 mg by mouth daily.     No current facility-administered medications for this visit.    OBJECTIVE: Vitals:   09/15/21 1004  BP: (!) 183/86  Pulse: 60  Resp: 18  Temp: (!) 95.8 F (35.4 C)  SpO2: 100%     Body mass index is 30.73 kg/m.    ECOG FS:0 - Asymptomatic  General: Well-developed, well-nourished, no acute distress. Eyes: Pink conjunctiva, anicteric sclera. HEENT: Normocephalic, moist mucous membranes.  No palpable lymphadenopathy. Lungs: No audible wheezing or coughing. Heart: Regular rate and rhythm. Abdomen: Soft, nontender, no obvious distention. Musculoskeletal: No edema, cyanosis, or clubbing. Neuro: Alert, answering all questions appropriately. Cranial nerves grossly intact. Skin: No rashes or petechiae noted. Psych: Normal affect.  LAB RESULTS:  Lab Results  Component Value Date   NA 138 10/12/2018   K 3.9 10/12/2018   CL 108 10/12/2018   CO2 22 10/12/2018   GLUCOSE 127 (H) 10/12/2018   BUN 24 (H) 10/12/2018   CREATININE 2.50 (H) 07/10/2021   CALCIUM 8.8 (L) 10/12/2018   PROT 6.7 10/12/2018   ALBUMIN 3.8 10/12/2018   AST 16 10/12/2018   ALT  16 10/12/2018   ALKPHOS 63 10/12/2018   BILITOT 0.7 10/12/2018   GFRNONAA 31 (L) 10/12/2018   GFRAA 36  (L) 10/12/2018    Lab Results  Component Value Date   WBC 8.1 10/12/2018   NEUTROABS 6.2 10/12/2018   HGB 13.0 10/12/2018   HCT 40.2 10/12/2018   MCV 90.7 10/12/2018   PLT 140 (L) 10/12/2018     STUDIES: NM PET Image Initial (PI) Skull Base To Thigh (F-18 FDG)  Result Date: 09/09/2021 CLINICAL DATA:  Initial treatment strategy for malignant neoplasm of base of tongue. EXAM: NUCLEAR MEDICINE PET SKULL BASE TO THIGH TECHNIQUE: 11.6 mCi F-18 FDG was injected intravenously. Full-ring PET imaging was performed from the skull base to thigh after the radiotracer. CT data was obtained and used for attenuation correction and anatomic localization. Fasting blood glucose: 88 mg/dl COMPARISON:  07/20/2021 neck CT. 03/31/2021 abdominopelvic CT. Chest CT 11/05/2020 FINDINGS: Mediastinal blood pool activity: SUV max 2.7 Liver activity: SUV max NA NECK: Low-level hypermetabolism at the right tongue base corresponding to possible soft tissue fullness. Example at a S.U.V. max of 3.7 on 50/3. No cervical nodal hypermetabolism. Incidental CT findings: Deferred to recent diagnostic CT. No cervical adenopathy. Bilateral carotid atherosclerosis. CHEST: No pulmonary parenchymal or thoracic nodal hypermetabolism. Incidental CT findings: Cardiomegaly. Aortic and coronary artery calcification. Borderline to mild ascending aortic dilatation, 4.0 cm. Tortuous thoracic aorta. Pulmonary artery enlargement, outflow tract 3.4 cm. Centrilobular and paraseptal emphysema. Right lower lobectomy. ABDOMEN/PELVIS: No abdominopelvic nodal hypermetabolism. Hypermetabolism about the tip of the penis is likely due to urinary contamination. Incidental CT findings: Normal adrenal glands. Mild renal cortical thinning bilaterally. Low-density renal lesions are likely cysts. Upper pole left renal 8 mm collecting system calculus is similar. Prostatectomy. Colonic stool burden suggests constipation. Status post aortic endograft repair within native  sac measuring 7.0 x 6.8 cm on 202/3. Similar to 6.9 x 6.6 cm on 03/31/2021. SKELETON: No abnormal marrow activity. Incidental CT findings: Lumbar spine fixation. Bilateral shoulder arthroplasty. IMPRESSION: 1. Mild hypermetabolism at the right tongue base, possibly representing the site of primary. No evidence of thoracic nodal or extrathoracic hypermetabolic metastasis. 2. Status post aortic endograft repair with similar native sac caliber compared to the prior abdominal CT of 03/31/2021. 3. Incidental findings, including: Aortic atherosclerosis (ICD10-I70.0), coronary artery atherosclerosis and emphysema (ICD10-J43.9). Left nephrolithiasis. Borderline to mild ascending aortic dilatation, similar to the chest CT of 11/05/2020. Pulmonary artery enlargement suggests pulmonary arterial hypertension. Electronically Signed   By: Abigail Miyamoto M.D.   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  PET scan results on September 09, 2021 reviewed independently and reported as above confirming stage of disease.  Patient does not require chemotherapy, but will benefit from XRT and has an appointment with radiation oncology later this morning.  Return to clinic at the end of his XRT for further evaluation.  Chronic renal insufficiency: Patient's most recent creatinine is 2.5 which appears to be approximately his baseline. History lung cancer: Patient had lobectomy and did not require any further treatment.  No evidence of disease.  PET scan results as above.  I spent a total of 20 minutes reviewing chart data, face-to-face evaluation with the patient, counseling and coordination of care as detailed  above.   Patient expressed understanding and was in agreement with this plan. He  also understands that He can call clinic at any time with any questions, concerns, or complaints.   Cancer Staging Cancer of lung ([place]) Staging form: Lung, AJCC 7th Edition - Clinical: Stage IA (T1a, N0, M0) - Signed by Lloyd Huger, MD on 09/01/2021 Laterality: Right  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[place]) Staging form: Oral Cavity, AJCC 8th Edition - Clinical stage from 09/15/2021: Stage I (cT1, cN0, cM0) - Signed by Lloyd Huger, MD on 09/15/2021 Stage prefix: Initial diagnosis   Lloyd Huger, MD   09/15/2021 5:49 PM

## 2021-09-14 ENCOUNTER — Ambulatory Visit: Payer: PPO | Admitting: Physical Therapy

## 2021-09-14 DIAGNOSIS — D631 Anemia in chronic kidney disease: Secondary | ICD-10-CM | POA: Diagnosis not present

## 2021-09-14 DIAGNOSIS — R809 Proteinuria, unspecified: Secondary | ICD-10-CM | POA: Diagnosis not present

## 2021-09-14 DIAGNOSIS — N2581 Secondary hyperparathyroidism of renal origin: Secondary | ICD-10-CM | POA: Diagnosis not present

## 2021-09-14 DIAGNOSIS — I129 Hypertensive chronic kidney disease with stage 1 through stage 4 chronic kidney disease, or unspecified chronic kidney disease: Secondary | ICD-10-CM | POA: Diagnosis not present

## 2021-09-14 DIAGNOSIS — R319 Hematuria, unspecified: Secondary | ICD-10-CM | POA: Diagnosis not present

## 2021-09-14 DIAGNOSIS — N184 Chronic kidney disease, stage 4 (severe): Secondary | ICD-10-CM | POA: Diagnosis not present

## 2021-09-15 ENCOUNTER — Inpatient Hospital Stay: Payer: PPO | Attending: Oncology | Admitting: Oncology

## 2021-09-15 ENCOUNTER — Ambulatory Visit
Admission: RE | Admit: 2021-09-15 | Discharge: 2021-09-15 | Disposition: A | Discharge status: Home / Self Care | Payer: PPO | Source: Ambulatory Visit | Attending: Radiation Oncology | Admitting: Radiation Oncology

## 2021-09-15 ENCOUNTER — Other Ambulatory Visit: Payer: Self-pay

## 2021-09-15 VITALS — BP 183/86 | HR 60 | Temp 95.8°F | Resp 18 | Wt 220.3 lb

## 2021-09-15 DIAGNOSIS — Z902 Acquired absence of lung [part of]: Secondary | ICD-10-CM | POA: Diagnosis not present

## 2021-09-15 DIAGNOSIS — Z87891 Personal history of nicotine dependence: Secondary | ICD-10-CM | POA: Diagnosis not present

## 2021-09-15 DIAGNOSIS — C01 Malignant neoplasm of base of tongue: Secondary | ICD-10-CM

## 2021-09-15 DIAGNOSIS — Z85118 Personal history of other malignant neoplasm of bronchus and lung: Secondary | ICD-10-CM | POA: Diagnosis not present

## 2021-09-15 DIAGNOSIS — N183 Chronic kidney disease, stage 3 unspecified: Secondary | ICD-10-CM | POA: Diagnosis not present

## 2021-09-15 DIAGNOSIS — Z79899 Other long term (current) drug therapy: Secondary | ICD-10-CM | POA: Diagnosis not present

## 2021-09-15 DIAGNOSIS — Z7982 Long term (current) use of aspirin: Secondary | ICD-10-CM | POA: Diagnosis not present

## 2021-09-15 NOTE — Progress Notes (Signed)
Pt has no concerns at this time. 

## 2021-09-15 NOTE — Consult Note (Signed)
NEW PATIENT EVALUATION  Name: Rick Mcbride.  MRN: [id number]  Date:   09/15/2021     DOB: [birth year]   This 80 y.o. 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  REFERRING PHYSICIAN: Idelle Crouch, MD  CHIEF COMPLAINT:  Chief Complaint  Patient presents with   Cancer    INitial consultation    DIAGNOSIS: The encounter diagnosis was 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[place]).   PREVIOUS INVESTIGATIONS:  CT scans PET CT scans reviewed Pathology report reviewed Clinical notes reviewed  HPI: Patient is a 80 year old male who presented with a bleeding from his base of tongue spontaneously.  He was having no dysphagia or pain.  Examination by ENT noticed a papillary lesion in the tongue base and biopsy was positive for papillary squamous cell carcinoma.  CT scan demonstrated a superficial 8 mm lesion at the right lung base.  PET CT scan showed mild hypermetabolic activity the right tongue base possibly representing site of primary squamous cell carcinoma no evidence of thoracic nodal or extrathoracic hypermetabolic disease is noted.  Cervical nodes were negative.  He does have aortic endograft repair.  He has been seen by medical oncology based on his age and stage I nature of his lesion he has been referred to ration [facility] for definitive treatment.  He is having still no dysphagia or head and neck pain at this time.  PLANNED TREATMENT REGIMEN: IMRT radiation therapy to base of tongue  PAST MEDICAL HISTORY:  has a past medical history of AAA (abdominal aortic aneurysm), Abnormality of tongue, Anemia, Aneurysm of right common iliac artery (HCC), Anxiety, Aortic atherosclerosis (HCC), Atrophic kidney, B12 deficiency, CAD (coronary artery disease), Cervical radiculopathy, Chronic airway obstruction (HCC), Chronic kidney disease (CKD), stage III (moderate) (HCC), Chronic pain syndrome  (06/16/2021), Chronic right shoulder pain (11/12/2019), Chronic tension headaches, Chronic, continuous use of opioids (06/16/2021), Coronary artery disease, DDD (degenerative disc disease), lumbar, Degenerative disc disease, lumbar, Elbow fracture, left, GERD (gastroesophageal reflux disease), H/O adenomatous polyp of colon, H/O hemorrhoids, HTN (hypertension), Hyperlipidemia, Meralgia paresthetica, Mild cognitive impairment, Nephrolithiasis, Neuralgia, Numbness of right foot (02/19/2021), Osteoarthritis, Pars defect of lumbar spine, Presence of permanent cardiac pacemaker, Prostate cancer ([place]), 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    PAST SURGICAL HISTORY:  Past Surgical History:  Procedure Laterality Date   CATARACT EXTRACTION Bilateral    COLONOSCOPY     COLONOSCOPY     COLONOSCOPY WITH PROPOFOL N/A 10/20/2015   Procedure: COLONOSCOPY WITH PROPOFOL;  Surgeon: Manya Silvas, MD;  Location: [facility] ENDOSCOPY;  Service: Endoscopy;  Laterality: N/A;   COLONOSCOPY WITH PROPOFOL N/A 11/28/2020   Procedure: COLONOSCOPY WITH PROPOFOL;  Surgeon: Robert Bellow, MD;  Location: [facility] ENDOSCOPY;  Service: Endoscopy;  Laterality: N/A;   CORONARY ARTERY BYPASS GRAFT N/A 05/01/2004   Procedure: 3v CABG (LIMA-LAD, SVG-OM1, SVG-PDA); Location: [facility]; Surgeon: Ander Gaster, MD   EMBOLIZATION Right 12/27/2016   Procedure: Embolization;  Surgeon: Algernon Huxley, MD;  Location: [place] [facility];  Service: Cardiovascular;  Laterality: Right;   ENDOVASCULAR REPAIR/STENT GRAFT N/A 01/05/2017   Procedure: Endovascular Repair/Stent Graft;  Surgeon: Algernon Huxley, MD;  Location: [place] [facility];  Service: Cardiovascular;  Laterality: N/A;   INSERT / REPLACE /  REMOVE PACEMAKER     JOINT REPLACEMENT      shoulder and knees   KNEE ARTHROSCOPY     LUNG LOBECTOMY Right 01/31/2013   Procedure: RIGHT PULMONARY LOBECTOMY; Location: [place]; Surgeon: Nestor Lewandowsky, MD   MICROLARYNGOSCOPY Right 08/10/2021   Procedure: MICRODIRECT LARYNGOSCOPY WITH BIOPSY OF TONGUE BASE;  Surgeon: Beverly Gust, MD;  Location: [facility] ORS;  Service: ENT;  Laterality: Right;   PACEMAKER INSERTION  12/2012   Dual chanber pacemaker generator   POLYPECTOMY     POSTERIOR LUMBAR FUSION     Procedure: POSTERIOR LUMBAR INTERBODY FUSION, INTERBODY PROSTHESIS, POSTERIOR LATERAL ARTHRODESIS, POSTERIOR NON-SEGMENTAL INSTRUMENTATION LUMBAR FIVE- SACRAL ONE; Location: [facility]; Surgeon: Newman Pies, MD   PROSTATECTOMY     TONGUE BIOPSY N/A 08/10/2021   Procedure: TONGUE BIOPSY;  Surgeon: Beverly Gust, MD;  Location: [facility] ORS;  Service: ENT;  Laterality: N/A;   TOTAL KNEE ARTHROPLASTY Bilateral    TOTAL SHOULDER ARTHROPLASTY Left 07/10/2015   Procedure: TOTAL SHOULDER ARTHROPLASTY;  Surgeon: [name], MD;  Location: [facility] ORS;  Service: Orthopedics;  Laterality: Left;   TOTAL SHOULDER REPLACEMENT      FAMILY HISTORY: family history includes Asthma in his sister; Breast cancer in his sister; Heart attack in his father and mother.  SOCIAL HISTORY:  reports that he quit smoking about 17 years ago. His smoking use included cigarettes. He has a 67.50 pack-year smoking history. He has never used smokeless tobacco. He reports that he does not drink alcohol and does not use drugs.  ALLERGIES: Patient has no known allergies.  MEDICATIONS:  Current Outpatient Medications  Medication Sig Dispense Refill   acetaminophen (TYLENOL) 500 MG tablet Take 500 mg by mouth every 4 (four) hours as needed for moderate pain.     amLODipine (NORVASC) 5 MG tablet Take 1 tablet (5 mg total) by mouth daily. 7 tablet 0   ascorbic acid (VITAMIN C) 1000 MG tablet Take 1,000 mg by mouth daily.     aspirin EC 81 MG tablet Take 81 mg by  mouth daily.      atorvastatin (LIPITOR) 20 MG tablet Take 40 mg by mouth at bedtime.     Calcium Carb-Cholecalciferol (CALCIUM 600 + D PO) Take 1 tablet by mouth daily.     cetirizine (ZYRTEC) 10 MG tablet Take 10 mg by mouth daily.     clopidogrel (PLAVIX) 75 MG tablet Take 75 mg by mouth daily.      clotrimazole (LOTRIMIN) 1 % cream Apply 1 application topically 2 (two) times daily as needed (athletes foot).     docusate sodium (COLACE) 100 MG capsule Take 300 mg by mouth at bedtime.     donepezil (ARICEPT) 5 MG tablet Take 5 mg by mouth at bedtime.     ezetimibe (ZETIA) 10 MG tablet Take 10 mg by mouth daily.     Ferrous Sulfate Dried (SLOW RELEASE IRON) 45 MG TBCR Take 45 mg by mouth 2 (two) times daily.     ipratropium (ATROVENT) 0.03 % nasal spray Place 2 sprays into both nostrils 2 (two) times daily as needed for rhinitis.     losartan (COZAAR) 100 MG tablet Take 100 mg by mouth daily.     Melatonin 10 MG TABS Take 10 mg by mouth at bedtime.      methocarbamol (ROBAXIN) 500 MG tablet Take 500 mg by mouth at bedtime.     metoprolol succinate (TOPROL-XL) 25 MG 24 hr tablet Take 25 mg by mouth daily.  Multiple Vitamin (MULTIVITAMIN WITH MINERALS) TABS tablet Take 1 tablet by mouth daily.      Multiple Vitamins-Minerals (PRESERVISION AREDS 2) CAPS Take 1 tablet by mouth 2 (two) times daily.     pantoprazole (PROTONIX) 40 MG tablet Take 40 mg by mouth daily.     polyethylene glycol (MIRALAX / GLYCOLAX) 17 g packet Take 17 g by mouth daily.     Probiotic Product (PROBIOTIC PO) Take 1 capsule by mouth daily.     traMADol (ULTRAM) 50 MG tablet Take 25 mg by mouth every 4 (four) hours as needed for moderate pain.     vitamin B-12 (CYANOCOBALAMIN) 1000 MCG tablet Take 1,000 mcg by mouth daily.     zolpidem (AMBIEN) 10 MG tablet Take 10 mg by mouth at bedtime.     No current facility-administered medications for this encounter.    ECOG PERFORMANCE STATUS:  0 - Asymptomatic  REVIEW OF  SYSTEMS: Patient denies any weight loss, fatigue, weakness, fever, chills or night sweats. Patient denies any loss of vision, blurred vision. Patient denies any ringing  of the ears or hearing loss. No irregular heartbeat. Patient denies heart murmur or history of fainting. Patient denies any chest pain or pain radiating to her upper extremities. Patient denies any shortness of breath, difficulty breathing at night, cough or hemoptysis. Patient denies any swelling in the lower legs. Patient denies any nausea vomiting, vomiting of blood, or coffee ground material in the vomitus. Patient denies any stomach pain. Patient states has had normal bowel movements no significant constipation or diarrhea. Patient denies any dysuria, hematuria or significant nocturia. Patient denies any problems walking, swelling in the joints or loss of balance. Patient denies any skin changes, loss of hair or loss of weight. Patient denies any excessive worrying or anxiety or significant depression. Patient denies any problems with insomnia. Patient denies excessive thirst, polyuria, polydipsia. Patient denies any swollen glands, patient denies easy bruising or easy bleeding. Patient denies any recent infections, allergies or URI. Patient "s visual fields have not changed significantly in recent time.   PHYSICAL EXAM: There were no vitals taken for this visit. Neck is clear without evidence of cervical or supraclavicular adenopathy.  Patient is wheelchair-bound.  He does have some peripheral neuropathy.  Well-developed well-nourished patient in NAD. HEENT reveals PERLA, EOMI, discs not visualized.  Oral cavity is clear. No oral mucosal lesions are identified. Neck is clear without evidence of cervical or supraclavicular adenopathy. Lungs are clear to A&P. Cardiac examination is essentially unremarkable with regular rate and rhythm without murmur rub or thrill. Abdomen is benign with no organomegaly or masses noted. Motor sensory and DTR  levels are equal and symmetric in the upper and lower extremities. Cranial nerves II through XII are grossly intact. Proprioception is intact. No peripheral adenopathy or edema is identified. No motor or sensory levels are noted. Crude visual fields are within normal range.  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  I would choose IMRT to spare critical structures such as a salivary glands spinal cord.  I would plan on delivering 66 Gray over 33 fractions.  Do not believe I need to cover his neck nodes since this is a T1 lesion and chances  of neck nodal metastasis with a negative PET is low.  Risks and benefits of treatment eluding increased problems with dysphagia fatigue alteration of blood counts skin reaction alterations of taste possible xerostomia all were reviewed in detail with the patient and his wife.  They both seem to comprehend my treatment plan well.  I have personally personally set up and ordered CT simulation for next week.  I would like to take this opportunity to thank you for allowing me to participate in the care of your patient.Noreene Filbert, MD

## 2021-09-16 ENCOUNTER — Ambulatory Visit: Payer: PPO | Admitting: Physical Therapy

## 2021-09-17 DIAGNOSIS — Z961 Presence of intraocular lens: Secondary | ICD-10-CM | POA: Diagnosis not present

## 2021-09-17 DIAGNOSIS — H353132 Nonexudative age-related macular degeneration, bilateral, intermediate dry stage: Secondary | ICD-10-CM | POA: Diagnosis not present

## 2021-09-21 ENCOUNTER — Ambulatory Visit: Payer: PPO | Admitting: Physical Therapy

## 2021-09-22 ENCOUNTER — Ambulatory Visit
Admission: RE | Admit: 2021-09-22 | Discharge: 2021-09-22 | Disposition: A | Discharge status: Home / Self Care | Payer: PPO | Source: Ambulatory Visit | Attending: Radiation Oncology | Admitting: Radiation Oncology

## 2021-09-23 ENCOUNTER — Ambulatory Visit: Payer: PPO | Admitting: Physical Therapy

## 2021-09-30 ENCOUNTER — Ambulatory Visit: Payer: PPO | Admitting: Physical Therapy

## 2021-10-05 ENCOUNTER — Ambulatory Visit: Payer: PPO

## 2021-10-05 ENCOUNTER — Ambulatory Visit: Payer: PPO | Admitting: Urology

## 2021-10-05 ENCOUNTER — Other Ambulatory Visit: Payer: Self-pay

## 2021-10-05 ENCOUNTER — Encounter: Payer: Self-pay | Admitting: Urology

## 2021-10-05 VITALS — BP 174/98 | HR 68 | Ht 70.0 in | Wt 210.0 lb

## 2021-10-05 DIAGNOSIS — Z8546 Personal history of malignant neoplasm of prostate: Secondary | ICD-10-CM

## 2021-10-05 DIAGNOSIS — R8271 Bacteriuria: Secondary | ICD-10-CM

## 2021-10-05 DIAGNOSIS — N200 Calculus of kidney: Secondary | ICD-10-CM | POA: Diagnosis not present

## 2021-10-05 NOTE — Progress Notes (Signed)
10/05/2021 1:09 PM   Rick T Kueker Jr. [birth year] [id number]  Referring provider: Idelle Crouch, MD [facility] [place],  [place] [address]  Chief Complaint  Patient presents with   Nephrolithiasis    HPI: 80 y.o. male presents for 3-month follow-up visit.  Chronic bacteriuria which has been asymptomatic CT with 10 mm nonobstructing left renal calculus and renal cyst Since last visit has been diagnosed with squamous cell carcinoma at base of tongue and scheduled to undergo radiation therapy UAs August and October 2022 were clear No bothersome LUTS Denies flank, abdominal or pelvic pain   PMH: Past Medical History:  Diagnosis Date   AAA (abdominal aortic aneurysm)    a.) s/p EVAR 01/05/2017. b.) native aneurysm sac 6.6 x 6.9 cm by CT on 03/31/2021   Abnormality of tongue    a.) CT head/neck 07/10/2021 --> asymmetric soft tissue at the RIGHT tongue base with a superficial 8 mm lesion.   Anemia    Aneurysm of right common iliac artery (HCC)    a.) measured 2.7 cm by CT on 03/31/2021   Anxiety    Aortic atherosclerosis (HCC)    Atrophic kidney    B12 deficiency    CAD (coronary artery disease)    Cervical radiculopathy    Chronic airway obstruction (HCC)    Chronic kidney disease (CKD), stage III (moderate) (HCC)    Chronic pain syndrome 06/16/2021   Chronic right shoulder pain 11/12/2019   Chronic tension headaches    Chronic, continuous use of opioids 06/16/2021   Coronary artery disease    DDD (degenerative disc disease), lumbar    Degenerative disc disease, lumbar    with lumbar radiculopathy   Elbow fracture, left    GERD (gastroesophageal reflux disease)    H/O adenomatous polyp of colon    H/O hemorrhoids    HTN (hypertension)    Hyperlipidemia    Meralgia paresthetica    Mild cognitive impairment    Nephrolithiasis    Neuralgia    Numbness of right foot 02/19/2021   Osteoarthritis    Pars defect of lumbar spine     L5 bilat w/anteriolisthesis   Presence of permanent cardiac pacemaker    Prostate cancer [facility])    a.) s/p prostatectomy   S/P CABG x 3 05/01/2004   a.) LVEF 40-49%; LIMA-LAD, SVG-OM1, SVG-PDA   Second degree AV block    Sinoatrial node dysfunction (HCC)    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    Surgical History: Past Surgical History:  Procedure Laterality Date   CATARACT EXTRACTION Bilateral    COLONOSCOPY     COLONOSCOPY     COLONOSCOPY WITH PROPOFOL N/A 10/20/2015   Procedure: COLONOSCOPY WITH PROPOFOL;  Surgeon: Manya Silvas, MD;  Location: [facility] ENDOSCOPY;  Service: Endoscopy;  Laterality: N/A;   COLONOSCOPY WITH PROPOFOL N/A 11/28/2020   Procedure: COLONOSCOPY WITH PROPOFOL;  Surgeon: Robert Bellow, MD;  Location: [facility] ENDOSCOPY;  Service: Endoscopy;  Laterality: N/A;   CORONARY ARTERY BYPASS GRAFT N/A 05/01/2004   Procedure: 3v CABG (LIMA-LAD, SVG-OM1, SVG-PDA); Location: [facility]; Surgeon: Ander Gaster, MD   EMBOLIZATION  Right 12/27/2016   Procedure: Embolization;  Surgeon: Algernon Huxley, MD;  Location: [place] [facility];  Service: Cardiovascular;  Laterality: Right;   ENDOVASCULAR REPAIR/STENT GRAFT N/A 01/05/2017   Procedure: Endovascular Repair/Stent Graft;  Surgeon: Algernon Huxley, MD;  Location: [place] [facility];  Service: Cardiovascular;  Laterality: N/A;   INSERT / REPLACE / REMOVE PACEMAKER     JOINT REPLACEMENT     shoulder and knees   KNEE ARTHROSCOPY     LUNG LOBECTOMY Right 01/31/2013   Procedure: RIGHT PULMONARY LOBECTOMY; Location: [place]; Surgeon: Nestor Lewandowsky, MD   MICROLARYNGOSCOPY Right 08/10/2021   Procedure: MICRODIRECT LARYNGOSCOPY WITH BIOPSY OF  TONGUE BASE;  Surgeon: Beverly Gust, MD;  Location: [facility] ORS;  Service: ENT;  Laterality: Right;   PACEMAKER INSERTION  12/2012   Dual chanber pacemaker generator   POLYPECTOMY     POSTERIOR LUMBAR FUSION     Procedure: POSTERIOR LUMBAR INTERBODY FUSION, INTERBODY PROSTHESIS, POSTERIOR LATERAL ARTHRODESIS, POSTERIOR NON-SEGMENTAL INSTRUMENTATION LUMBAR FIVE- SACRAL ONE; Location: [facility]; Surgeon: Newman Pies, MD   PROSTATECTOMY     TONGUE BIOPSY N/A 08/10/2021   Procedure: TONGUE BIOPSY;  Surgeon: Beverly Gust, MD;  Location: [facility] ORS;  Service: ENT;  Laterality: N/A;   TOTAL KNEE ARTHROPLASTY Bilateral    TOTAL SHOULDER ARTHROPLASTY Left 07/10/2015   Procedure: TOTAL SHOULDER ARTHROPLASTY;  Surgeon: [name], MD;  Location: [facility] ORS;  Service: Orthopedics;  Laterality: Left;   TOTAL SHOULDER REPLACEMENT      Home Medications:  Allergies as of 10/05/2021   No Known Allergies      Medication List        Accurate as of October 05, 2021  1:09 PM. If you have any questions, ask your nurse or doctor.          acetaminophen 500 MG tablet Commonly known as: TYLENOL Take 500 mg by mouth every 4 (four) hours as needed for moderate pain.   amLODipine 5 MG tablet Commonly known as: NORVASC Take 1 tablet (5 mg total) by mouth daily.   ascorbic acid 1000 MG tablet Commonly known as: VITAMIN C Take 1,000 mg by mouth daily.   aspirin EC 81 MG tablet Take 81 mg by mouth daily.   atorvastatin 20 MG tablet Commonly known as: LIPITOR Take 40 mg by mouth at bedtime.   CALCIUM 600 + D PO Take 1 tablet by mouth daily.   cetirizine 10 MG tablet Commonly known as: ZYRTEC Take 10 mg by mouth daily.   clopidogrel 75 MG tablet Commonly known as: PLAVIX Take 75 mg by mouth daily.   clotrimazole 1 % cream Commonly known as: LOTRIMIN Apply 1 application topically 2 (two) times daily as needed (athletes foot).   docusate sodium 100 MG capsule Commonly  known as: COLACE Take 300 mg by mouth at bedtime.   donepezil 5 MG tablet Commonly known as: ARICEPT Take 5 mg by mouth at bedtime.   ezetimibe 10 MG tablet Commonly known as: ZETIA Take 10 mg by mouth daily.   ipratropium 0.03 % nasal spray Commonly known as: ATROVENT Place 2 sprays into both nostrils 2 (two) times daily as needed for rhinitis.   losartan 100 MG tablet Commonly known as: COZAAR Take 100 mg by mouth daily.   Melatonin 10 MG Tabs Take 10 mg by mouth at bedtime.   methocarbamol 500 MG tablet Commonly known as: ROBAXIN Take 500 mg by mouth at bedtime.   metoprolol succinate 25 MG 24 hr  tablet Commonly known as: TOPROL-XL Take 25 mg by mouth daily.   multivitamin with minerals Tabs tablet Take 1 tablet by mouth daily.   pantoprazole 40 MG tablet Commonly known as: PROTONIX Take 40 mg by mouth daily.   polyethylene glycol 17 g packet Commonly known as: MIRALAX / GLYCOLAX Take 17 g by mouth daily.   PreserVision AREDS 2 Caps Take 1 tablet by mouth 2 (two) times daily.   PROBIOTIC PO Take 1 capsule by mouth daily.   Slow Release Iron 45 MG Tbcr Generic drug: Ferrous Sulfate Dried Take 45 mg by mouth 2 (two) times daily.   traMADol 50 MG tablet Commonly known as: ULTRAM Take 25 mg by mouth every 4 (four) hours as needed for moderate pain.   vitamin B-12 1000 MCG tablet Commonly known as: CYANOCOBALAMIN Take 1,000 mcg by mouth daily.   zolpidem 10 MG tablet Commonly known as: AMBIEN Take 10 mg by mouth at bedtime.        Allergies: No Known Allergies  Family History: Family History  Problem Relation Age of Onset   Heart attack Mother    Heart attack Father    Breast cancer Sister    Asthma Sister     Social History:  reports that he quit smoking about 17 years ago. His smoking use included cigarettes. He has a 67.50 pack-year smoking history. He has never used smokeless tobacco. He reports that he does not drink alcohol and does  not use drugs.   Physical Exam: BP (!) 174/98   Pulse 68   Ht 5\' 10"  (1.778 m)   Wt 210 lb (95.3 kg)   BMI 30.13 kg/m   Constitutional:  Alert and oriented, No acute distress. HEENT: [place] AT, moist mucus membranes.  Trachea midline, no masses. Cardiovascular: No clubbing, cyanosis, or edema. Respiratory: Normal respiratory effort, no increased work of breathing. Psychiatric: Normal mood and affect.   Assessment & Plan:    1.  Left nephrolithiasis KUB ordered and will call with results.  They would like to have done after the first of the year Follow-up 1 year with KUB  2.  Asymptomatic bacteriuria Last 2 urinalyses have been clear   Abbie Sons, MD  [facility] [address], [place], [place] [address] [phone]

## 2021-10-06 ENCOUNTER — Ambulatory Visit: Payer: PPO

## 2021-10-07 ENCOUNTER — Ambulatory Visit: Payer: PPO | Admitting: Physical Therapy

## 2021-10-07 ENCOUNTER — Ambulatory Visit: Payer: PPO

## 2021-10-08 ENCOUNTER — Ambulatory Visit: Payer: PPO

## 2021-10-08 ENCOUNTER — Other Ambulatory Visit: Payer: Self-pay | Admitting: Anesthesiology

## 2021-10-09 ENCOUNTER — Ambulatory Visit: Payer: PPO

## 2021-10-09 DIAGNOSIS — C029 Malignant neoplasm of tongue, unspecified: Secondary | ICD-10-CM | POA: Diagnosis not present

## 2021-10-09 DIAGNOSIS — R739 Hyperglycemia, unspecified: Secondary | ICD-10-CM | POA: Diagnosis not present

## 2021-10-09 DIAGNOSIS — E782 Mixed hyperlipidemia: Secondary | ICD-10-CM | POA: Diagnosis not present

## 2021-10-09 DIAGNOSIS — Z79899 Other long term (current) drug therapy: Secondary | ICD-10-CM | POA: Diagnosis not present

## 2021-10-09 DIAGNOSIS — I10 Essential (primary) hypertension: Secondary | ICD-10-CM | POA: Diagnosis not present

## 2021-10-09 DIAGNOSIS — Z125 Encounter for screening for malignant neoplasm of prostate: Secondary | ICD-10-CM | POA: Diagnosis not present

## 2021-10-09 DIAGNOSIS — N1832 Chronic kidney disease, stage 3b: Secondary | ICD-10-CM | POA: Diagnosis not present

## 2021-10-12 ENCOUNTER — Telehealth: Payer: Self-pay

## 2021-10-12 ENCOUNTER — Other Ambulatory Visit: Payer: Self-pay | Admitting: Anesthesiology

## 2021-10-12 ENCOUNTER — Ambulatory Visit: Payer: PPO

## 2021-10-12 NOTE — Telephone Encounter (Signed)
Needs appt. Last appt was 06-16-21, with instructions to return in 3 months.

## 2021-10-12 NOTE — Telephone Encounter (Signed)
Pt is completely out of pain meds

## 2021-10-13 ENCOUNTER — Ambulatory Visit: Payer: PPO

## 2021-10-13 ENCOUNTER — Encounter: Payer: Self-pay | Admitting: Anesthesiology

## 2021-10-13 ENCOUNTER — Other Ambulatory Visit: Payer: Self-pay

## 2021-10-13 ENCOUNTER — Ambulatory Visit: Payer: PPO | Attending: Anesthesiology | Admitting: Anesthesiology

## 2021-10-13 DIAGNOSIS — M25562 Pain in left knee: Secondary | ICD-10-CM | POA: Diagnosis not present

## 2021-10-13 DIAGNOSIS — M51369 Other intervertebral disc degeneration, lumbar region without mention of lumbar back pain or lower extremity pain: Secondary | ICD-10-CM

## 2021-10-13 DIAGNOSIS — M25511 Pain in right shoulder: Secondary | ICD-10-CM | POA: Diagnosis not present

## 2021-10-13 DIAGNOSIS — R29898 Other symptoms and signs involving the musculoskeletal system: Secondary | ICD-10-CM | POA: Diagnosis not present

## 2021-10-13 DIAGNOSIS — M5136 Other intervertebral disc degeneration, lumbar region: Secondary | ICD-10-CM

## 2021-10-13 DIAGNOSIS — G8929 Other chronic pain: Secondary | ICD-10-CM | POA: Diagnosis not present

## 2021-10-13 DIAGNOSIS — M48062 Spinal stenosis, lumbar region with neurogenic claudication: Secondary | ICD-10-CM

## 2021-10-13 DIAGNOSIS — R200 Anesthesia of skin: Secondary | ICD-10-CM

## 2021-10-13 DIAGNOSIS — M961 Postlaminectomy syndrome, not elsewhere classified: Secondary | ICD-10-CM | POA: Diagnosis not present

## 2021-10-13 DIAGNOSIS — M5432 Sciatica, left side: Secondary | ICD-10-CM

## 2021-10-13 DIAGNOSIS — I495 Sick sinus syndrome: Secondary | ICD-10-CM | POA: Diagnosis not present

## 2021-10-13 MED ORDER — METHOCARBAMOL 500 MG PO TABS: 500.0000 mg | ORAL_TABLET | Freq: Two times a day (BID) | ORAL | 5 refills | Status: AC

## 2021-10-13 MED ORDER — TRAMADOL HCL 50 MG PO TABS: 25.0000 mg | ORAL_TABLET | ORAL | 2 refills | Status: AC | PRN

## 2021-10-14 ENCOUNTER — Ambulatory Visit: Payer: PPO

## 2021-10-14 NOTE — Progress Notes (Signed)
Virtual Visit via Telephone Note  I connected with Rick Mcbride. on 10/14/21 at  1:45 PM EST by telephone and verified that I am speaking with the correct person using two identifiers.  Location: Patient: Home Provider: Pain control center   I discussed the limitations, risks, security and privacy concerns of performing an evaluation and management service by telephone and the availability of in person appointments. I also discussed with the patient that there may be a patient responsible charge related to this service. The patient expressed understanding and agreed to proceed.   History of Present Illness: I spoke with Rick Mcbride via telephone today we were unable to link for the video portion of the conference and he reports that he still doing well with the tramadol.  It does not take his pain away entirely but he takes a half a tablet every 4 hours and generally averages about 3 tablets/day unless he is having a bad day at which point he takes 4 tablets/day.  No change in the quality characteristic or distribution of his low back pain or intermittent neck pain are noted.  He is try to stay active doing stretching activities and staying active around the house with chores and sleeping reasonably well.  No side effects reported with the medications and his lower extremity strength and function is at baseline.  He feels that his overall function is better with the opioid medications and he has failed more conservative therapy he reports.  Review of systems: General: No fevers or chills Pulmonary: No shortness of breath or dyspnea Cardiac: No angina or palpitations or lightheadedness GI: No abdominal pain or constipation Psych: No depression    Observations/Objective:  Current Outpatient Medications:    methocarbamol (ROBAXIN) 500 MG tablet, Take 1 tablet (500 mg total) by mouth 2 (two) times daily., Disp: 60 tablet, Rfl: 5   acetaminophen (TYLENOL) 500 MG tablet, Take 500 mg by mouth every  4 (four) hours as needed for moderate pain., Disp: , Rfl:    amLODipine (NORVASC) 5 MG tablet, Take 1 tablet (5 mg total) by mouth daily., Disp: 7 tablet, Rfl: 0   ascorbic acid (VITAMIN C) 1000 MG tablet, Take 1,000 mg by mouth daily., Disp: , Rfl:    aspirin EC 81 MG tablet, Take 81 mg by mouth daily. , Disp: , Rfl:    atorvastatin (LIPITOR) 20 MG tablet, Take 40 mg by mouth at bedtime., Disp: , Rfl:    Calcium Carb-Cholecalciferol (CALCIUM 600 + D PO), Take 1 tablet by mouth daily., Disp: , Rfl:    cetirizine (ZYRTEC) 10 MG tablet, Take 10 mg by mouth daily., Disp: , Rfl:    clopidogrel (PLAVIX) 75 MG tablet, Take 75 mg by mouth daily. , Disp: , Rfl:    clotrimazole (LOTRIMIN) 1 % cream, Apply 1 application topically 2 (two) times daily as needed (athletes foot)., Disp: , Rfl:    docusate sodium (COLACE) 100 MG capsule, Take 300 mg by mouth at bedtime., Disp: , Rfl:    donepezil (ARICEPT) 5 MG tablet, Take 5 mg by mouth at bedtime., Disp: , Rfl:    ezetimibe (ZETIA) 10 MG tablet, Take 10 mg by mouth daily., Disp: , Rfl:    Ferrous Sulfate Dried (SLOW RELEASE IRON) 45 MG TBCR, Take 45 mg by mouth 2 (two) times daily., Disp: , Rfl:    ipratropium (ATROVENT) 0.03 % nasal spray, Place 2 sprays into both nostrils 2 (two) times daily as needed for rhinitis., Disp: , Rfl:  losartan (COZAAR) 100 MG tablet, Take 100 mg by mouth daily., Disp: , Rfl:    Melatonin 10 MG TABS, Take 10 mg by mouth at bedtime. , Disp: , Rfl:    methocarbamol (ROBAXIN) 500 MG tablet, Take 500 mg by mouth at bedtime., Disp: , Rfl:    metoprolol succinate (TOPROL-XL) 25 MG 24 hr tablet, Take 25 mg by mouth daily., Disp: , Rfl:    Multiple Vitamin (MULTIVITAMIN WITH MINERALS) TABS tablet, Take 1 tablet by mouth daily. , Disp: , Rfl:    Multiple Vitamins-Minerals (PRESERVISION AREDS 2) CAPS, Take 1 tablet by mouth 2 (two) times daily., Disp: , Rfl:    pantoprazole (PROTONIX) 40 MG tablet, Take 40 mg by mouth daily., Disp: ,  Rfl:    polyethylene glycol (MIRALAX / GLYCOLAX) 17 g packet, Take 17 g by mouth daily., Disp: , Rfl:    Probiotic Product (PROBIOTIC PO), Take 1 capsule by mouth daily., Disp: , Rfl:    traMADol (ULTRAM) 50 MG tablet, Take 0.5 tablets (25 mg total) by mouth every 4 (four) hours as needed for moderate pain., Disp: 120 tablet, Rfl: 2   vitamin B-12 (CYANOCOBALAMIN) 1000 MCG tablet, Take 1,000 mcg by mouth daily., Disp: , Rfl:    zolpidem (AMBIEN) 10 MG tablet, Take 10 mg by mouth at bedtime., Disp: , Rfl:    Assessment and Plan: 1. DDD (degenerative disc disease), lumbar   2. Failed back surgical syndrome   3. Sciatica of left side   4. Weakness of both legs   5. Spinal stenosis, lumbar region, with neurogenic claudication   6. Arthralgia of left lower leg   7. Chronic right shoulder pain   8. Numbness of right foot   Based on her discussion today I think is appropriate to refill his medications for the next 2 months.  I think it be appropriate for him to increase his dose if needed for the tramadol and he desires to keep this under 120 tablets/month.  I think he has been very vigilant about this.  I am encouraged that he is continue to stay active and work on his stretching strengthening exercises.  In the meantime he can continue with Robaxin for muscle spasms in the low back.  No other changes will be made in his pain management protocol at this point and of requested that he continue follow-up with primary care physicians for his baseline medical care.  Follow Up Instructions:    I discussed the assessment and treatment plan with the patient. The patient was provided an opportunity to ask questions and all were answered. The patient agreed with the plan and demonstrated an understanding of the instructions.   The patient was advised to call back or seek an in-person evaluation if the symptoms worsen or if the condition fails to improve as anticipated.  I provided 30 minutes of  non-face-to-face time during this encounter.   Molli Barrows, MD

## 2021-10-15 ENCOUNTER — Ambulatory Visit: Payer: PPO

## 2021-10-16 ENCOUNTER — Ambulatory Visit: Payer: PPO

## 2021-10-19 ENCOUNTER — Ambulatory Visit: Payer: PPO

## 2021-10-20 ENCOUNTER — Ambulatory Visit: Payer: PPO

## 2021-10-21 ENCOUNTER — Ambulatory Visit: Payer: PPO

## 2021-10-22 ENCOUNTER — Ambulatory Visit: Payer: PPO

## 2021-10-23 ENCOUNTER — Ambulatory Visit: Payer: PPO

## 2021-10-26 ENCOUNTER — Ambulatory Visit: Payer: PPO

## 2021-10-26 DIAGNOSIS — M7581 Other shoulder lesions, right shoulder: Secondary | ICD-10-CM | POA: Diagnosis not present

## 2021-10-26 DIAGNOSIS — M19011 Primary osteoarthritis, right shoulder: Secondary | ICD-10-CM | POA: Diagnosis not present

## 2021-10-26 DIAGNOSIS — Z96611 Presence of right artificial shoulder joint: Secondary | ICD-10-CM | POA: Diagnosis not present

## 2021-10-27 ENCOUNTER — Ambulatory Visit: Payer: PPO

## 2021-10-28 ENCOUNTER — Ambulatory Visit
Admission: RE | Admit: 2021-10-28 | Discharge: 2021-10-28 | Disposition: A | Discharge status: Home / Self Care | Payer: PPO | Source: Ambulatory Visit | Attending: Radiation Oncology | Admitting: Radiation Oncology

## 2021-10-28 ENCOUNTER — Ambulatory Visit: Payer: PPO

## 2021-10-28 DIAGNOSIS — C01 Malignant neoplasm of base of tongue: Secondary | ICD-10-CM | POA: Diagnosis not present

## 2021-10-28 DIAGNOSIS — Z87891 Personal history of nicotine dependence: Secondary | ICD-10-CM | POA: Diagnosis not present

## 2021-10-29 ENCOUNTER — Ambulatory Visit: Payer: PPO

## 2021-10-30 ENCOUNTER — Ambulatory Visit: Payer: PPO

## 2021-11-03 ENCOUNTER — Ambulatory Visit: Payer: PPO

## 2021-11-03 ENCOUNTER — Other Ambulatory Visit (INDEPENDENT_AMBULATORY_CARE_PROVIDER_SITE_OTHER): Payer: PPO

## 2021-11-03 ENCOUNTER — Ambulatory Visit (INDEPENDENT_AMBULATORY_CARE_PROVIDER_SITE_OTHER): Payer: PPO | Admitting: Vascular Surgery

## 2021-11-04 ENCOUNTER — Ambulatory Visit: Payer: PPO

## 2021-11-05 ENCOUNTER — Other Ambulatory Visit: Payer: Self-pay | Admitting: *Deleted

## 2021-11-05 ENCOUNTER — Ambulatory Visit: Payer: PPO

## 2021-11-05 DIAGNOSIS — C01 Malignant neoplasm of base of tongue: Secondary | ICD-10-CM

## 2021-11-06 ENCOUNTER — Other Ambulatory Visit (INDEPENDENT_AMBULATORY_CARE_PROVIDER_SITE_OTHER): Payer: Self-pay | Admitting: Vascular Surgery

## 2021-11-06 ENCOUNTER — Ambulatory Visit: Payer: PPO

## 2021-11-06 DIAGNOSIS — C01 Malignant neoplasm of base of tongue: Secondary | ICD-10-CM | POA: Diagnosis not present

## 2021-11-06 DIAGNOSIS — Z87891 Personal history of nicotine dependence: Secondary | ICD-10-CM | POA: Diagnosis not present

## 2021-11-06 DIAGNOSIS — I714 Abdominal aortic aneurysm, without rupture, unspecified: Secondary | ICD-10-CM

## 2021-11-10 ENCOUNTER — Ambulatory Visit: Admission: RE | Admit: 2021-11-10 | Payer: PPO | Source: Ambulatory Visit

## 2021-11-10 ENCOUNTER — Other Ambulatory Visit: Payer: Self-pay

## 2021-11-10 ENCOUNTER — Ambulatory Visit (INDEPENDENT_AMBULATORY_CARE_PROVIDER_SITE_OTHER): Payer: PPO | Admitting: Vascular Surgery

## 2021-11-10 ENCOUNTER — Ambulatory Visit (INDEPENDENT_AMBULATORY_CARE_PROVIDER_SITE_OTHER): Payer: PPO

## 2021-11-10 ENCOUNTER — Ambulatory Visit: Payer: PPO

## 2021-11-10 ENCOUNTER — Encounter (INDEPENDENT_AMBULATORY_CARE_PROVIDER_SITE_OTHER): Payer: Self-pay | Admitting: Vascular Surgery

## 2021-11-10 VITALS — BP 142/80 | HR 80 | Resp 16 | Wt 219.6 lb

## 2021-11-10 DIAGNOSIS — I10 Essential (primary) hypertension: Secondary | ICD-10-CM | POA: Diagnosis not present

## 2021-11-10 DIAGNOSIS — M545 Low back pain, unspecified: Secondary | ICD-10-CM

## 2021-11-10 DIAGNOSIS — I714 Abdominal aortic aneurysm, without rupture, unspecified: Secondary | ICD-10-CM | POA: Diagnosis not present

## 2021-11-10 DIAGNOSIS — C01 Malignant neoplasm of base of tongue: Secondary | ICD-10-CM | POA: Diagnosis not present

## 2021-11-10 DIAGNOSIS — E7800 Pure hypercholesterolemia, unspecified: Secondary | ICD-10-CM

## 2021-11-10 DIAGNOSIS — G8929 Other chronic pain: Secondary | ICD-10-CM

## 2021-11-10 NOTE — Assessment & Plan Note (Signed)
Recently diagnosed.  To begin radiation treatment soon.

## 2021-11-10 NOTE — Progress Notes (Signed)
MRN : [id number]  Rick Mcbride. is a 81 y.o. (06-07-1941) male who presents with chief complaint of  Chief Complaint  Patient presents with   Follow-up    Ultrasound follow up  .  History of Present Illness: Patient returns today in follow up of his abdominal aortic aneurysm.  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  He is to begin radiation treatments in the near future.  He has no aneurysm related symptoms at current.  He has chronic low back pain which has been unchanged over many years but nothing new.  No signs of peripheral embolization or abdominal pain. Duplex today shows a stable aneurysm sac size approximately 6.3 cm.  His stent graft is widely patent and there is no endoleak.  Current Outpatient Medications  Medication Sig Dispense Refill   acetaminophen (TYLENOL) 500 MG tablet Take 500 mg by mouth every 4 (four) hours as needed for moderate pain.     amLODipine (NORVASC) 5 MG tablet Take 1 tablet (5 mg total) by mouth daily. 7 tablet 0   ascorbic acid (VITAMIN C) 1000 MG tablet Take 1,000 mg by mouth daily.     aspirin EC 81 MG tablet Take 81 mg by mouth daily.      atorvastatin (LIPITOR) 20 MG tablet Take 40 mg by mouth at bedtime.     Calcium Carb-Cholecalciferol (CALCIUM 600 + D PO) Take 1 tablet by mouth daily.     cetirizine (ZYRTEC) 10 MG tablet Take 10 mg by mouth daily.     clopidogrel (PLAVIX) 75 MG tablet Take 75 mg by mouth daily.      clotrimazole (LOTRIMIN) 1 % cream Apply 1 application topically 2 (two) times daily as needed (athletes foot).     docusate sodium (COLACE) 100 MG capsule Take 300 mg by mouth at bedtime.     donepezil (ARICEPT) 5 MG tablet Take 5 mg by mouth at bedtime.     Ferrous Sulfate Dried (SLOW RELEASE IRON) 45 MG TBCR Take 45 mg by mouth 2 (two) times daily.     ipratropium (ATROVENT) 0.03 % nasal spray Place 2 sprays into both nostrils 2 (two) times daily as needed for rhinitis.      losartan (COZAAR) 100 MG tablet Take 100 mg by mouth daily.     Melatonin 10 MG TABS Take 10 mg by mouth at bedtime.      methocarbamol (ROBAXIN) 500 MG tablet Take 500 mg by mouth at bedtime.     methocarbamol (ROBAXIN) 500 MG tablet Take 1 tablet (500 mg total) by mouth 2 (two) times daily. 60 tablet 5   methocarbamol (ROBAXIN) 500 MG tablet Take by mouth.     metoprolol succinate (TOPROL-XL) 25 MG 24 hr tablet Take 25 mg by mouth daily.     Multiple Vitamin (MULTIVITAMIN WITH MINERALS) TABS tablet Take 1 tablet by mouth daily.      Multiple Vitamins-Minerals (PRESERVISION AREDS 2) CAPS Take 1 tablet by mouth 2 (two) times daily.     pantoprazole (PROTONIX) 40 MG tablet Take 40 mg by mouth daily.     polyethylene glycol (MIRALAX / GLYCOLAX) 17 g packet Take 17 g by mouth daily.     Probiotic Product (PROBIOTIC PO) Take 1 capsule by mouth daily.     traMADol (ULTRAM) 50 MG tablet Take 0.5 tablets (25 mg total) by mouth every 4 (four) hours as needed for moderate pain. 120 tablet 2  traMADol (ULTRAM) 50 MG tablet Take by mouth.     vitamin B-12 (CYANOCOBALAMIN) 1000 MCG tablet Take 1,000 mcg by mouth daily.     zolpidem (AMBIEN) 10 MG tablet Take 10 mg by mouth at bedtime.     ezetimibe (ZETIA) 10 MG tablet Take 10 mg by mouth daily.     No current facility-administered medications for this visit.    Past Medical History:  Diagnosis Date   AAA (abdominal aortic aneurysm)    a.) s/p EVAR 01/05/2017. b.) native aneurysm sac 6.6 x 6.9 cm by CT on 03/31/2021   Abnormality of tongue    a.) CT head/neck 07/10/2021 --> asymmetric soft tissue at the RIGHT tongue base with a superficial 8 mm lesion.   Anemia    Aneurysm of right common iliac artery (HCC)    a.) measured 2.7 cm by CT on 03/31/2021   Anxiety    Aortic atherosclerosis (HCC)    Atrophic kidney    B12 deficiency    CAD (coronary artery disease)    Cervical radiculopathy    Chronic airway obstruction (HCC)    Chronic kidney  disease (CKD), stage III (moderate) (HCC)    Chronic pain syndrome 06/16/2021   Chronic right shoulder pain 11/12/2019   Chronic tension headaches    Chronic, continuous use of opioids 06/16/2021   Coronary artery disease    DDD (degenerative disc disease), lumbar    Degenerative disc disease, lumbar    with lumbar radiculopathy   Elbow fracture, left    GERD (gastroesophageal reflux disease)    H/O adenomatous polyp of colon    H/O hemorrhoids    HTN (hypertension)    Hyperlipidemia    Meralgia paresthetica    Mild cognitive impairment    Nephrolithiasis    Neuralgia    Numbness of right foot 02/19/2021   Osteoarthritis    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   Presence of permanent cardiac pacemaker    Prostate cancer [facility])    a.) s/p prostatectomy   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    Past Surgical History:  Procedure Laterality Date   CATARACT EXTRACTION Bilateral    COLONOSCOPY     COLONOSCOPY     COLONOSCOPY WITH PROPOFOL N/A 10/20/2015   Procedure: COLONOSCOPY WITH PROPOFOL;  Surgeon: Manya Silvas, MD;  Location: [facility] ENDOSCOPY;  Service: Endoscopy;  Laterality: N/A;   COLONOSCOPY WITH PROPOFOL N/A 11/28/2020   Procedure: COLONOSCOPY WITH PROPOFOL;  Surgeon: Robert Bellow, MD;  Location: [facility] ENDOSCOPY;  Service: Endoscopy;  Laterality: N/A;   CORONARY ARTERY BYPASS GRAFT N/A 05/01/2004   Procedure: 3v CABG (LIMA-LAD, SVG-OM1, SVG-PDA); Location: [facility]; Surgeon: Ander Gaster, MD   EMBOLIZATION Right  12/27/2016   Procedure: Embolization;  Surgeon: Algernon Huxley, MD;  Location: [place] CV LAB;  Service: Cardiovascular;  Laterality: Right;   ENDOVASCULAR REPAIR/STENT GRAFT N/A 01/05/2017   Procedure: Endovascular Repair/Stent Graft;  Surgeon: Erskine Squibb  Lucky Cowboy, MD;  Location: [place] CV LAB;  Service: Cardiovascular;  Laterality: N/A;   INSERT / REPLACE / REMOVE PACEMAKER     JOINT REPLACEMENT     shoulder and knees   KNEE ARTHROSCOPY     LUNG LOBECTOMY Right 01/31/2013   Procedure: RIGHT PULMONARY LOBECTOMY; Location: [place]; Surgeon: Nestor Lewandowsky, MD   MICROLARYNGOSCOPY Right 08/10/2021   Procedure: MICRODIRECT LARYNGOSCOPY WITH BIOPSY OF TONGUE BASE;  Surgeon: Beverly Gust, MD;  Location: [facility] ORS;  Service: ENT;  Laterality: Right;   PACEMAKER INSERTION  12/2012   Dual chanber pacemaker generator   POLYPECTOMY     POSTERIOR LUMBAR FUSION     Procedure: POSTERIOR LUMBAR INTERBODY FUSION, INTERBODY PROSTHESIS, POSTERIOR LATERAL ARTHRODESIS, POSTERIOR NON-SEGMENTAL INSTRUMENTATION LUMBAR FIVE- SACRAL ONE; Location: [facility]; Surgeon: Newman Pies, MD   PROSTATECTOMY     TONGUE BIOPSY N/A 08/10/2021   Procedure: TONGUE BIOPSY;  Surgeon: Beverly Gust, MD;  Location: [facility] ORS;  Service: ENT;  Laterality: N/A;   TOTAL KNEE ARTHROPLASTY Bilateral    TOTAL SHOULDER ARTHROPLASTY Left 07/10/2015   Procedure: TOTAL SHOULDER ARTHROPLASTY;  Surgeon: [name], MD;  Location: [facility] ORS;  Service: Orthopedics;  Laterality: Left;   TOTAL SHOULDER REPLACEMENT       Social History   Tobacco Use   Smoking status: Former    Packs/day: 1.50    Years: 45.00    Pack years: 67.50    Types: Cigarettes    Quit date: 04/07/2004    Years since quitting: 17.6   Smokeless tobacco: Never  Vaping Use   Vaping Use: Never used  Substance Use Topics   Alcohol use: No   Drug use: No      Family History  Problem Relation Age of Onset   Heart attack Mother    Heart attack  Father    Breast cancer Sister    Asthma Sister      No Known Allergies   REVIEW OF SYSTEMS (Negative unless checked)  Constitutional: [] Weight loss  [] Fever  [] Chills Cardiac: [] Chest pain   [] Chest pressure   [] Palpitations   [] Shortness of breath when laying flat   [] Shortness of breath at rest   [] Shortness of breath with exertion. Vascular:  [] Pain in legs with walking   [] Pain in legs at rest   [] Pain in legs when laying flat   [] Claudication   [] Pain in feet when walking  [] Pain in feet at rest  [] Pain in feet when laying flat   [] History of DVT   [] Phlebitis   [] Swelling in legs   [] Varicose veins   [] Non-healing ulcers Pulmonary:   [] Uses home oxygen   [] Productive cough   [] Hemoptysis   [] Wheeze  [] COPD   [] Asthma Neurologic:  [] Dizziness  [] Blackouts   [] Seizures   [x] History of stroke   [x] History of TIA  [] Aphasia   [] Temporary blindness   [] Dysphagia   [] Weakness or numbness in arms   [] Weakness or numbness in legs Musculoskeletal:  [x] Arthritis   [] Joint swelling   [x] Joint pain   [x] Low back pain Hematologic:  [] Easy bruising  [] Easy bleeding   [] Hypercoagulable state   [] Anemic   Gastrointestinal:  [] Blood in stool   [] Vomiting blood  [] Gastroesophageal reflux/heartburn   [] Abdominal pain Genitourinary:  [] Chronic kidney disease   [] Difficult urination  [x] Frequent urination  [] Burning with urination   [] Hematuria Skin:  [] Rashes   [] Ulcers   [] Wounds Psychological:  [] History of anxiety   []  History of major depression.  Physical Examination  BP (!) 142/80 (BP Location: Right Arm)    Pulse 80    Resp 16    Wt 219 lb 9.6 oz (99.6 kg)    BMI 31.51 kg/m  Gen:  WD/WN, NAD Head: [place]/AT, No temporalis wasting. Ear/Nose/Throat: Hearing grossly intact, nares w/o erythema or drainage Eyes: Conjunctiva clear. Sclera non-icteric Neck: Supple.  Trachea midline Pulmonary:  Good air movement, no use of accessory muscles.  Cardiac: RRR, no JVD Vascular:  Vessel Right Left   Radial Palpable Palpable                                   Gastrointestinal: soft, non-tender/non-distended. No guarding/reflex.  Musculoskeletal: M/S 5/5 throughout.  No deformity or atrophy.  Trace lower extremity edema.  Walks with a cane Neurologic: Sensation grossly intact in extremities.  Symmetrical.  Speech is fluent.  Psychiatric: Judgment intact, Mood & affect appropriate for pt's clinical situation. Dermatologic: No rashes or ulcers noted.  No cellulitis or open wounds.      Labs Recent Results (from the past 2160 hour(s))  Glucose, capillary     Status: None   Collection Time: 09/09/21  8:19 AM  Result Value Ref Range   Glucose-Capillary 88 70 - 99 mg/dL    Comment: Glucose reference range applies only to samples taken after fasting for at least 8 hours.    Radiology No results found.  Assessment/Plan Low back pain Has had back surgery after his aneurysm repair which may have stabilized his symptoms somewhat, but he still remains symptomatic   Essential (primary) hypertension blood pressure control important in reducing the progression of atherosclerotic disease and aneurysmal degeneration. On appropriate oral medications.  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[place]) Recently diagnosed.  To begin radiation treatment soon.  AAA (abdominal aortic aneurysm) without rupture (HCC) Duplex today shows a stable aneurysm sac size approximately 6.3 cm.  His stent graft is widely patent and there is no endoleak.  No role for intervention.  Continue to follow on an annual basis.    [name], MD  11/10/2021 9:24 AM    This note was created with Dragon medical transcription system.  Any errors from dictation are purely unintentional

## 2021-11-10 NOTE — Assessment & Plan Note (Signed)
Duplex today shows a stable aneurysm sac size approximately 6.3 cm.  His stent graft is widely patent and there is no endoleak.  No role for intervention.  Continue to follow on an annual basis.

## 2021-11-11 ENCOUNTER — Ambulatory Visit: Payer: PPO

## 2021-11-11 ENCOUNTER — Ambulatory Visit
Admission: RE | Admit: 2021-11-11 | Discharge: 2021-11-11 | Disposition: A | Discharge status: Home / Self Care | Payer: PPO | Source: Ambulatory Visit | Attending: Radiation Oncology | Admitting: Radiation Oncology

## 2021-11-11 DIAGNOSIS — C01 Malignant neoplasm of base of tongue: Secondary | ICD-10-CM | POA: Diagnosis not present

## 2021-11-11 DIAGNOSIS — Z87891 Personal history of nicotine dependence: Secondary | ICD-10-CM | POA: Diagnosis not present

## 2021-11-12 ENCOUNTER — Ambulatory Visit: Payer: PPO

## 2021-11-12 ENCOUNTER — Ambulatory Visit
Admission: RE | Admit: 2021-11-12 | Discharge: 2021-11-12 | Disposition: A | Discharge status: Home / Self Care | Payer: PPO | Source: Ambulatory Visit | Attending: Radiation Oncology | Admitting: Radiation Oncology

## 2021-11-12 DIAGNOSIS — C01 Malignant neoplasm of base of tongue: Secondary | ICD-10-CM | POA: Diagnosis not present

## 2021-11-12 DIAGNOSIS — Z87891 Personal history of nicotine dependence: Secondary | ICD-10-CM | POA: Diagnosis not present

## 2021-11-13 ENCOUNTER — Ambulatory Visit
Admission: RE | Admit: 2021-11-13 | Discharge: 2021-11-13 | Disposition: A | Discharge status: Home / Self Care | Payer: PPO | Source: Ambulatory Visit | Attending: Radiation Oncology | Admitting: Radiation Oncology

## 2021-11-13 ENCOUNTER — Ambulatory Visit: Payer: PPO

## 2021-11-13 DIAGNOSIS — Z87891 Personal history of nicotine dependence: Secondary | ICD-10-CM | POA: Diagnosis not present

## 2021-11-13 DIAGNOSIS — C01 Malignant neoplasm of base of tongue: Secondary | ICD-10-CM | POA: Diagnosis not present

## 2021-11-16 ENCOUNTER — Ambulatory Visit: Payer: PPO

## 2021-11-16 ENCOUNTER — Ambulatory Visit
Admission: RE | Admit: 2021-11-16 | Discharge: 2021-11-16 | Disposition: A | Discharge status: Home / Self Care | Payer: PPO | Source: Ambulatory Visit | Attending: Radiation Oncology | Admitting: Radiation Oncology

## 2021-11-16 DIAGNOSIS — C01 Malignant neoplasm of base of tongue: Secondary | ICD-10-CM | POA: Diagnosis not present

## 2021-11-16 DIAGNOSIS — Z87891 Personal history of nicotine dependence: Secondary | ICD-10-CM | POA: Diagnosis not present

## 2021-11-17 ENCOUNTER — Other Ambulatory Visit: Payer: Self-pay | Admitting: *Deleted

## 2021-11-17 ENCOUNTER — Ambulatory Visit
Admission: RE | Admit: 2021-11-17 | Discharge: 2021-11-17 | Disposition: A | Discharge status: Home / Self Care | Payer: PPO | Source: Ambulatory Visit | Attending: Radiation Oncology | Admitting: Radiation Oncology

## 2021-11-17 ENCOUNTER — Ambulatory Visit: Payer: PPO

## 2021-11-17 DIAGNOSIS — C01 Malignant neoplasm of base of tongue: Secondary | ICD-10-CM | POA: Diagnosis not present

## 2021-11-17 DIAGNOSIS — Z87891 Personal history of nicotine dependence: Secondary | ICD-10-CM | POA: Diagnosis not present

## 2021-11-18 ENCOUNTER — Ambulatory Visit: Payer: PPO

## 2021-11-18 ENCOUNTER — Ambulatory Visit
Admission: RE | Admit: 2021-11-18 | Discharge: 2021-11-18 | Disposition: A | Discharge status: Home / Self Care | Payer: PPO | Source: Ambulatory Visit | Attending: Radiation Oncology | Admitting: Radiation Oncology

## 2021-11-18 DIAGNOSIS — C01 Malignant neoplasm of base of tongue: Secondary | ICD-10-CM | POA: Diagnosis not present

## 2021-11-19 ENCOUNTER — Ambulatory Visit: Payer: PPO

## 2021-11-19 ENCOUNTER — Ambulatory Visit
Admission: RE | Admit: 2021-11-19 | Discharge: 2021-11-19 | Disposition: A | Discharge status: Home / Self Care | Payer: PPO | Source: Ambulatory Visit | Attending: Radiation Oncology | Admitting: Radiation Oncology

## 2021-11-19 ENCOUNTER — Inpatient Hospital Stay: Payer: PPO | Attending: Radiation Oncology

## 2021-11-19 DIAGNOSIS — Z87891 Personal history of nicotine dependence: Secondary | ICD-10-CM | POA: Diagnosis not present

## 2021-11-19 DIAGNOSIS — C01 Malignant neoplasm of base of tongue: Secondary | ICD-10-CM | POA: Diagnosis not present

## 2021-11-20 ENCOUNTER — Ambulatory Visit
Admission: RE | Admit: 2021-11-20 | Discharge: 2021-11-20 | Disposition: A | Discharge status: Home / Self Care | Payer: PPO | Source: Ambulatory Visit | Attending: Radiation Oncology | Admitting: Radiation Oncology

## 2021-11-20 ENCOUNTER — Ambulatory Visit: Payer: PPO

## 2021-11-20 DIAGNOSIS — Z87891 Personal history of nicotine dependence: Secondary | ICD-10-CM | POA: Diagnosis not present

## 2021-11-20 DIAGNOSIS — C01 Malignant neoplasm of base of tongue: Secondary | ICD-10-CM | POA: Diagnosis not present

## 2021-11-23 ENCOUNTER — Ambulatory Visit
Admission: RE | Admit: 2021-11-23 | Discharge: 2021-11-23 | Disposition: A | Discharge status: Home / Self Care | Payer: PPO | Source: Ambulatory Visit | Attending: Radiation Oncology | Admitting: Radiation Oncology

## 2021-11-23 ENCOUNTER — Ambulatory Visit: Payer: PPO

## 2021-11-23 ENCOUNTER — Other Ambulatory Visit: Payer: Self-pay | Admitting: *Deleted

## 2021-11-23 DIAGNOSIS — C01 Malignant neoplasm of base of tongue: Secondary | ICD-10-CM | POA: Diagnosis not present

## 2021-11-23 DIAGNOSIS — Z87891 Personal history of nicotine dependence: Secondary | ICD-10-CM | POA: Diagnosis not present

## 2021-11-23 MED ORDER — SUCRALFATE 1 G PO TABS: 1.0000 g | ORAL_TABLET | Freq: Three times a day (TID) | ORAL | 1 refills | Status: DC

## 2021-11-24 ENCOUNTER — Ambulatory Visit
Admission: RE | Admit: 2021-11-24 | Discharge: 2021-11-24 | Disposition: A | Discharge status: Home / Self Care | Payer: PPO | Source: Ambulatory Visit | Attending: Radiation Oncology | Admitting: Radiation Oncology

## 2021-11-24 DIAGNOSIS — Z87891 Personal history of nicotine dependence: Secondary | ICD-10-CM | POA: Diagnosis not present

## 2021-11-24 DIAGNOSIS — C01 Malignant neoplasm of base of tongue: Secondary | ICD-10-CM | POA: Diagnosis not present

## 2021-11-24 DIAGNOSIS — R682 Dry mouth, unspecified: Secondary | ICD-10-CM | POA: Diagnosis not present

## 2021-11-25 ENCOUNTER — Ambulatory Visit
Admission: RE | Admit: 2021-11-25 | Discharge: 2021-11-25 | Disposition: A | Discharge status: Home / Self Care | Payer: PPO | Source: Ambulatory Visit | Attending: Radiation Oncology | Admitting: Radiation Oncology

## 2021-11-25 DIAGNOSIS — Z87891 Personal history of nicotine dependence: Secondary | ICD-10-CM | POA: Diagnosis not present

## 2021-11-25 DIAGNOSIS — C01 Malignant neoplasm of base of tongue: Secondary | ICD-10-CM | POA: Diagnosis not present

## 2021-11-26 ENCOUNTER — Ambulatory Visit
Admission: RE | Admit: 2021-11-26 | Discharge: 2021-11-26 | Disposition: A | Discharge status: Home / Self Care | Payer: PPO | Source: Ambulatory Visit | Attending: Radiation Oncology | Admitting: Radiation Oncology

## 2021-11-26 ENCOUNTER — Inpatient Hospital Stay: Payer: PPO

## 2021-11-26 DIAGNOSIS — C01 Malignant neoplasm of base of tongue: Secondary | ICD-10-CM | POA: Diagnosis not present

## 2021-11-26 DIAGNOSIS — Z87891 Personal history of nicotine dependence: Secondary | ICD-10-CM | POA: Diagnosis not present

## 2021-11-27 ENCOUNTER — Ambulatory Visit
Admission: RE | Admit: 2021-11-27 | Discharge: 2021-11-27 | Disposition: A | Discharge status: Home / Self Care | Payer: PPO | Source: Ambulatory Visit | Attending: Radiation Oncology | Admitting: Radiation Oncology

## 2021-11-27 DIAGNOSIS — Z87891 Personal history of nicotine dependence: Secondary | ICD-10-CM | POA: Diagnosis not present

## 2021-11-27 DIAGNOSIS — C01 Malignant neoplasm of base of tongue: Secondary | ICD-10-CM | POA: Diagnosis not present

## 2021-11-30 ENCOUNTER — Ambulatory Visit
Admission: RE | Admit: 2021-11-30 | Discharge: 2021-11-30 | Disposition: A | Discharge status: Home / Self Care | Payer: PPO | Source: Ambulatory Visit | Attending: Radiation Oncology | Admitting: Radiation Oncology

## 2021-11-30 DIAGNOSIS — Z87891 Personal history of nicotine dependence: Secondary | ICD-10-CM | POA: Diagnosis not present

## 2021-11-30 DIAGNOSIS — C01 Malignant neoplasm of base of tongue: Secondary | ICD-10-CM | POA: Diagnosis not present

## 2021-12-01 ENCOUNTER — Ambulatory Visit
Admission: RE | Admit: 2021-12-01 | Discharge: 2021-12-01 | Disposition: A | Discharge status: Home / Self Care | Payer: PPO | Source: Ambulatory Visit | Attending: Radiation Oncology | Admitting: Radiation Oncology

## 2021-12-01 DIAGNOSIS — Z87891 Personal history of nicotine dependence: Secondary | ICD-10-CM | POA: Diagnosis not present

## 2021-12-01 DIAGNOSIS — C01 Malignant neoplasm of base of tongue: Secondary | ICD-10-CM | POA: Diagnosis not present

## 2021-12-02 ENCOUNTER — Ambulatory Visit
Admission: RE | Admit: 2021-12-02 | Discharge: 2021-12-02 | Disposition: A | Discharge status: Home / Self Care | Payer: PPO | Source: Ambulatory Visit | Attending: Radiation Oncology | Admitting: Radiation Oncology

## 2021-12-02 DIAGNOSIS — Z87891 Personal history of nicotine dependence: Secondary | ICD-10-CM | POA: Diagnosis not present

## 2021-12-02 DIAGNOSIS — C01 Malignant neoplasm of base of tongue: Secondary | ICD-10-CM | POA: Diagnosis not present

## 2021-12-03 ENCOUNTER — Ambulatory Visit
Admission: RE | Admit: 2021-12-03 | Discharge: 2021-12-03 | Disposition: A | Discharge status: Home / Self Care | Payer: PPO | Source: Ambulatory Visit | Attending: Radiation Oncology | Admitting: Radiation Oncology

## 2021-12-03 ENCOUNTER — Inpatient Hospital Stay: Payer: PPO

## 2021-12-03 DIAGNOSIS — C01 Malignant neoplasm of base of tongue: Secondary | ICD-10-CM | POA: Diagnosis not present

## 2021-12-03 DIAGNOSIS — Z87891 Personal history of nicotine dependence: Secondary | ICD-10-CM | POA: Diagnosis not present

## 2021-12-04 ENCOUNTER — Ambulatory Visit
Admission: RE | Admit: 2021-12-04 | Discharge: 2021-12-04 | Disposition: A | Discharge status: Home / Self Care | Payer: PPO | Source: Ambulatory Visit | Attending: Radiation Oncology | Admitting: Radiation Oncology

## 2021-12-04 DIAGNOSIS — C01 Malignant neoplasm of base of tongue: Secondary | ICD-10-CM | POA: Diagnosis not present

## 2021-12-04 DIAGNOSIS — Z87891 Personal history of nicotine dependence: Secondary | ICD-10-CM | POA: Diagnosis not present

## 2021-12-07 ENCOUNTER — Ambulatory Visit
Admission: RE | Admit: 2021-12-07 | Discharge: 2021-12-07 | Disposition: A | Discharge status: Home / Self Care | Payer: PPO | Source: Ambulatory Visit | Attending: Radiation Oncology | Admitting: Radiation Oncology

## 2021-12-07 DIAGNOSIS — Z87891 Personal history of nicotine dependence: Secondary | ICD-10-CM | POA: Diagnosis not present

## 2021-12-07 DIAGNOSIS — C01 Malignant neoplasm of base of tongue: Secondary | ICD-10-CM | POA: Diagnosis not present

## 2021-12-08 ENCOUNTER — Ambulatory Visit
Admission: RE | Admit: 2021-12-08 | Discharge: 2021-12-08 | Disposition: A | Discharge status: Home / Self Care | Payer: PPO | Source: Ambulatory Visit | Attending: Radiation Oncology | Admitting: Radiation Oncology

## 2021-12-08 DIAGNOSIS — Z87891 Personal history of nicotine dependence: Secondary | ICD-10-CM | POA: Diagnosis not present

## 2021-12-08 DIAGNOSIS — C01 Malignant neoplasm of base of tongue: Secondary | ICD-10-CM | POA: Diagnosis not present

## 2021-12-09 ENCOUNTER — Ambulatory Visit
Admission: RE | Admit: 2021-12-09 | Discharge: 2021-12-09 | Disposition: A | Discharge status: Home / Self Care | Payer: PPO | Source: Ambulatory Visit | Attending: Radiation Oncology | Admitting: Radiation Oncology

## 2021-12-09 DIAGNOSIS — C01 Malignant neoplasm of base of tongue: Secondary | ICD-10-CM | POA: Insufficient documentation

## 2021-12-09 DIAGNOSIS — Z87891 Personal history of nicotine dependence: Secondary | ICD-10-CM | POA: Diagnosis not present

## 2021-12-10 ENCOUNTER — Inpatient Hospital Stay: Payer: PPO

## 2021-12-10 ENCOUNTER — Ambulatory Visit
Admission: RE | Admit: 2021-12-10 | Discharge: 2021-12-10 | Disposition: A | Discharge status: Home / Self Care | Payer: PPO | Source: Ambulatory Visit | Attending: Radiation Oncology | Admitting: Radiation Oncology

## 2021-12-10 DIAGNOSIS — C01 Malignant neoplasm of base of tongue: Secondary | ICD-10-CM | POA: Insufficient documentation

## 2021-12-10 DIAGNOSIS — Z87891 Personal history of nicotine dependence: Secondary | ICD-10-CM | POA: Diagnosis not present

## 2021-12-11 ENCOUNTER — Ambulatory Visit
Admission: RE | Admit: 2021-12-11 | Discharge: 2021-12-11 | Disposition: A | Discharge status: Home / Self Care | Payer: PPO | Source: Ambulatory Visit | Attending: Radiation Oncology | Admitting: Radiation Oncology

## 2021-12-11 DIAGNOSIS — C01 Malignant neoplasm of base of tongue: Secondary | ICD-10-CM | POA: Diagnosis not present

## 2021-12-11 DIAGNOSIS — Z87891 Personal history of nicotine dependence: Secondary | ICD-10-CM | POA: Diagnosis not present

## 2021-12-14 ENCOUNTER — Ambulatory Visit
Admission: RE | Admit: 2021-12-14 | Discharge: 2021-12-14 | Disposition: A | Discharge status: Home / Self Care | Payer: PPO | Source: Ambulatory Visit | Attending: Radiation Oncology | Admitting: Radiation Oncology

## 2021-12-14 DIAGNOSIS — C01 Malignant neoplasm of base of tongue: Secondary | ICD-10-CM | POA: Diagnosis not present

## 2021-12-14 DIAGNOSIS — Z87891 Personal history of nicotine dependence: Secondary | ICD-10-CM | POA: Diagnosis not present

## 2021-12-15 ENCOUNTER — Ambulatory Visit
Admission: RE | Admit: 2021-12-15 | Discharge: 2021-12-15 | Disposition: A | Discharge status: Home / Self Care | Payer: PPO | Source: Ambulatory Visit | Attending: Radiation Oncology | Admitting: Radiation Oncology

## 2021-12-15 DIAGNOSIS — C01 Malignant neoplasm of base of tongue: Secondary | ICD-10-CM | POA: Diagnosis not present

## 2021-12-15 DIAGNOSIS — Z87891 Personal history of nicotine dependence: Secondary | ICD-10-CM | POA: Diagnosis not present

## 2021-12-16 ENCOUNTER — Ambulatory Visit
Admission: RE | Admit: 2021-12-16 | Discharge: 2021-12-16 | Disposition: A | Discharge status: Home / Self Care | Payer: PPO | Source: Ambulatory Visit | Attending: Radiation Oncology | Admitting: Radiation Oncology

## 2021-12-16 DIAGNOSIS — D631 Anemia in chronic kidney disease: Secondary | ICD-10-CM | POA: Diagnosis not present

## 2021-12-16 DIAGNOSIS — Z87891 Personal history of nicotine dependence: Secondary | ICD-10-CM | POA: Diagnosis not present

## 2021-12-16 DIAGNOSIS — R809 Proteinuria, unspecified: Secondary | ICD-10-CM | POA: Diagnosis not present

## 2021-12-16 DIAGNOSIS — R319 Hematuria, unspecified: Secondary | ICD-10-CM | POA: Diagnosis not present

## 2021-12-16 DIAGNOSIS — C01 Malignant neoplasm of base of tongue: Secondary | ICD-10-CM | POA: Diagnosis not present

## 2021-12-16 DIAGNOSIS — N184 Chronic kidney disease, stage 4 (severe): Secondary | ICD-10-CM | POA: Diagnosis not present

## 2021-12-16 DIAGNOSIS — N2581 Secondary hyperparathyroidism of renal origin: Secondary | ICD-10-CM | POA: Diagnosis not present

## 2021-12-16 DIAGNOSIS — I129 Hypertensive chronic kidney disease with stage 1 through stage 4 chronic kidney disease, or unspecified chronic kidney disease: Secondary | ICD-10-CM | POA: Diagnosis not present

## 2021-12-17 ENCOUNTER — Ambulatory Visit
Admission: RE | Admit: 2021-12-17 | Discharge: 2021-12-17 | Disposition: A | Discharge status: Home / Self Care | Payer: PPO | Source: Ambulatory Visit | Attending: Radiation Oncology | Admitting: Radiation Oncology

## 2021-12-17 ENCOUNTER — Other Ambulatory Visit: Payer: Self-pay

## 2021-12-17 ENCOUNTER — Inpatient Hospital Stay: Payer: PPO

## 2021-12-17 DIAGNOSIS — C01 Malignant neoplasm of base of tongue: Secondary | ICD-10-CM

## 2021-12-17 DIAGNOSIS — Z87891 Personal history of nicotine dependence: Secondary | ICD-10-CM | POA: Diagnosis not present

## 2021-12-17 LAB — CBC
HCT: 36.7 % — ABNORMAL LOW (ref 39.0–52.0)
Hemoglobin: 11.5 g/dL — ABNORMAL LOW (ref 13.0–17.0)
MCH: 28.7 pg (ref 26.0–34.0)
MCHC: 31.3 g/dL (ref 30.0–36.0)
MCV: 91.5 fL (ref 80.0–100.0)
Platelets: 209 10*3/uL (ref 150–400)
RBC: 4.01 MIL/uL — ABNORMAL LOW (ref 4.22–5.81)
RDW: 14.7 % (ref 11.5–15.5)
WBC: 6.5 10*3/uL (ref 4.0–10.5)
nRBC: 0 % (ref 0.0–0.2)

## 2021-12-18 ENCOUNTER — Ambulatory Visit
Admission: RE | Admit: 2021-12-18 | Discharge: 2021-12-18 | Disposition: A | Discharge status: Home / Self Care | Payer: PPO | Source: Ambulatory Visit | Attending: Radiation Oncology | Admitting: Radiation Oncology

## 2021-12-18 DIAGNOSIS — Z87891 Personal history of nicotine dependence: Secondary | ICD-10-CM | POA: Diagnosis not present

## 2021-12-18 DIAGNOSIS — C01 Malignant neoplasm of base of tongue: Secondary | ICD-10-CM | POA: Diagnosis not present

## 2021-12-21 ENCOUNTER — Ambulatory Visit
Admission: RE | Admit: 2021-12-21 | Discharge: 2021-12-21 | Disposition: A | Discharge status: Home / Self Care | Payer: PPO | Source: Ambulatory Visit | Attending: Radiation Oncology | Admitting: Radiation Oncology

## 2021-12-21 DIAGNOSIS — C01 Malignant neoplasm of base of tongue: Secondary | ICD-10-CM | POA: Diagnosis not present

## 2021-12-21 DIAGNOSIS — I129 Hypertensive chronic kidney disease with stage 1 through stage 4 chronic kidney disease, or unspecified chronic kidney disease: Secondary | ICD-10-CM | POA: Diagnosis not present

## 2021-12-21 DIAGNOSIS — N184 Chronic kidney disease, stage 4 (severe): Secondary | ICD-10-CM | POA: Diagnosis not present

## 2021-12-21 DIAGNOSIS — D631 Anemia in chronic kidney disease: Secondary | ICD-10-CM | POA: Diagnosis not present

## 2021-12-21 DIAGNOSIS — N2581 Secondary hyperparathyroidism of renal origin: Secondary | ICD-10-CM | POA: Diagnosis not present

## 2021-12-21 DIAGNOSIS — Z87891 Personal history of nicotine dependence: Secondary | ICD-10-CM | POA: Diagnosis not present

## 2021-12-21 DIAGNOSIS — R809 Proteinuria, unspecified: Secondary | ICD-10-CM | POA: Diagnosis not present

## 2021-12-21 DIAGNOSIS — R319 Hematuria, unspecified: Secondary | ICD-10-CM | POA: Diagnosis not present

## 2021-12-22 ENCOUNTER — Ambulatory Visit
Admission: RE | Admit: 2021-12-22 | Discharge: 2021-12-22 | Disposition: A | Discharge status: Home / Self Care | Payer: PPO | Source: Ambulatory Visit | Attending: Radiation Oncology | Admitting: Radiation Oncology

## 2021-12-22 DIAGNOSIS — C01 Malignant neoplasm of base of tongue: Secondary | ICD-10-CM | POA: Diagnosis not present

## 2021-12-22 DIAGNOSIS — Z87891 Personal history of nicotine dependence: Secondary | ICD-10-CM | POA: Diagnosis not present

## 2021-12-23 ENCOUNTER — Ambulatory Visit
Admission: RE | Admit: 2021-12-23 | Discharge: 2021-12-23 | Disposition: A | Discharge status: Home / Self Care | Payer: PPO | Source: Ambulatory Visit | Attending: Radiation Oncology | Admitting: Radiation Oncology

## 2021-12-23 DIAGNOSIS — C01 Malignant neoplasm of base of tongue: Secondary | ICD-10-CM | POA: Diagnosis not present

## 2021-12-23 DIAGNOSIS — Z87891 Personal history of nicotine dependence: Secondary | ICD-10-CM | POA: Diagnosis not present

## 2021-12-24 ENCOUNTER — Ambulatory Visit
Admission: RE | Admit: 2021-12-24 | Discharge: 2021-12-24 | Disposition: A | Discharge status: Home / Self Care | Payer: PPO | Source: Ambulatory Visit | Attending: Radiation Oncology | Admitting: Radiation Oncology

## 2021-12-24 ENCOUNTER — Inpatient Hospital Stay: Payer: PPO

## 2021-12-24 DIAGNOSIS — C01 Malignant neoplasm of base of tongue: Secondary | ICD-10-CM | POA: Diagnosis not present

## 2021-12-24 DIAGNOSIS — Z87891 Personal history of nicotine dependence: Secondary | ICD-10-CM | POA: Diagnosis not present

## 2021-12-25 ENCOUNTER — Ambulatory Visit
Admission: RE | Admit: 2021-12-25 | Discharge: 2021-12-25 | Disposition: A | Discharge status: Home / Self Care | Payer: PPO | Source: Ambulatory Visit | Attending: Radiation Oncology | Admitting: Radiation Oncology

## 2021-12-25 DIAGNOSIS — Z87891 Personal history of nicotine dependence: Secondary | ICD-10-CM | POA: Diagnosis not present

## 2021-12-25 DIAGNOSIS — C01 Malignant neoplasm of base of tongue: Secondary | ICD-10-CM | POA: Diagnosis not present

## 2021-12-31 DIAGNOSIS — Z125 Encounter for screening for malignant neoplasm of prostate: Secondary | ICD-10-CM | POA: Diagnosis not present

## 2021-12-31 DIAGNOSIS — R739 Hyperglycemia, unspecified: Secondary | ICD-10-CM | POA: Diagnosis not present

## 2021-12-31 DIAGNOSIS — Z79899 Other long term (current) drug therapy: Secondary | ICD-10-CM | POA: Diagnosis not present

## 2021-12-31 DIAGNOSIS — I10 Essential (primary) hypertension: Secondary | ICD-10-CM | POA: Diagnosis not present

## 2021-12-31 DIAGNOSIS — E782 Mixed hyperlipidemia: Secondary | ICD-10-CM | POA: Diagnosis not present

## 2022-01-07 DIAGNOSIS — J431 Panlobular emphysema: Secondary | ICD-10-CM | POA: Diagnosis not present

## 2022-01-07 DIAGNOSIS — I10 Essential (primary) hypertension: Secondary | ICD-10-CM | POA: Diagnosis not present

## 2022-01-07 DIAGNOSIS — Z0000 Encounter for general adult medical examination without abnormal findings: Secondary | ICD-10-CM | POA: Diagnosis not present

## 2022-01-07 DIAGNOSIS — M5137 Other intervertebral disc degeneration, lumbosacral region: Secondary | ICD-10-CM | POA: Diagnosis not present

## 2022-01-07 DIAGNOSIS — I25710 Atherosclerosis of autologous vein coronary artery bypass graft(s) with unstable angina pectoris: Secondary | ICD-10-CM | POA: Diagnosis not present

## 2022-01-07 DIAGNOSIS — N183 Chronic kidney disease, stage 3 unspecified: Secondary | ICD-10-CM | POA: Diagnosis not present

## 2022-01-07 DIAGNOSIS — D631 Anemia in chronic kidney disease: Secondary | ICD-10-CM | POA: Diagnosis not present

## 2022-01-07 DIAGNOSIS — R29898 Other symptoms and signs involving the musculoskeletal system: Secondary | ICD-10-CM | POA: Diagnosis not present

## 2022-01-07 DIAGNOSIS — R739 Hyperglycemia, unspecified: Secondary | ICD-10-CM | POA: Diagnosis not present

## 2022-01-07 DIAGNOSIS — C029 Malignant neoplasm of tongue, unspecified: Secondary | ICD-10-CM | POA: Diagnosis not present

## 2022-01-07 DIAGNOSIS — E782 Mixed hyperlipidemia: Secondary | ICD-10-CM | POA: Diagnosis not present

## 2022-01-21 ENCOUNTER — Encounter: Payer: Self-pay | Admitting: Radiation Oncology

## 2022-01-21 ENCOUNTER — Other Ambulatory Visit: Payer: Self-pay

## 2022-01-21 ENCOUNTER — Ambulatory Visit
Admission: RE | Admit: 2022-01-21 | Discharge: 2022-01-21 | Disposition: A | Discharge status: Home / Self Care | Payer: PPO | Source: Ambulatory Visit | Attending: Radiation Oncology | Admitting: Radiation Oncology

## 2022-01-21 VITALS — BP 155/75 | HR 62 | Temp 97.4°F | Ht 71.0 in | Wt 213.0 lb

## 2022-01-21 DIAGNOSIS — C01 Malignant neoplasm of base of tongue: Secondary | ICD-10-CM | POA: Diagnosis not present

## 2022-01-21 DIAGNOSIS — M542 Cervicalgia: Secondary | ICD-10-CM | POA: Diagnosis not present

## 2022-01-21 DIAGNOSIS — Z923 Personal history of irradiation: Secondary | ICD-10-CM | POA: Diagnosis not present

## 2022-01-21 NOTE — Progress Notes (Signed)
Radiation Oncology ?Follow up Note ? ?Name: Rick Mcbride.   ?Date:   01/21/2022 ?MRN:  [id number] ?DOB: [birth year]  ? ? ?This 81 y.o. 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 ? ?REFERRING PROVIDER: Idelle Crouch, MD ? ?HPI: Patient is a 81 year old male now out 1 month having completed external beam radiation therapy to his tongue base for stage I papillary squamous of carcinoma.  Seen today in routine follow-up he is doing well specifically [place] any head and neck pain or dysphagia he states his taste is also unchanged.  His p.o. intake is good.. ? ?COMPLICATIONS OF TREATMENT: none ? ?FOLLOW UP COMPLIANCE: keeps appointments  ? ?PHYSICAL EXAM:  ?BP (!) 155/75   Pulse 62   Temp (!) 97.4 ?F (36.3 ?C)   Ht 5\' 11"  (1.803 m)   Wt 213 lb (96.6 kg)   BMI 29.71 kg/m?  ?Oral cavity is clear no oral mucosal lesions are identified.  Neck is clear without evidence of cervical adenopathy.  Well-developed well-nourished patient in NAD. HEENT reveals PERLA, EOMI, discs not visualized.  Oral cavity is clear. No oral mucosal lesions are identified. Neck is clear without evidence of cervical or supraclavicular adenopathy. Lungs are clear to A&P. Cardiac examination is essentially unremarkable with regular rate and rhythm without murmur rub or thrill. Abdomen is benign with no organomegaly or masses noted. Motor sensory and DTR levels are equal and symmetric in the upper and lower extremities. Cranial nerves II through XII are grossly intact. Proprioception is intact. No peripheral adenopathy or edema is identified. No motor or sensory levels are noted. Crude visual fields are within normal range. ? ?RADIOLOGY RESULTS: No current films for review ? ?PLAN: Present time patient clinically is doing well has a follow-up appointment with ENT next week and will have a formal head and neck exam at that  time.  I have asked to see him in 4 to 5 months for follow-up.  Patient needs knows to call with any concerns at any time. ? ?I would like to take this opportunity to thank you for allowing me to participate in the care of your patient.. ?  ? Noreene Filbert, MD ? ?

## 2022-01-25 DIAGNOSIS — C01 Malignant neoplasm of base of tongue: Secondary | ICD-10-CM | POA: Diagnosis not present

## 2022-01-25 DIAGNOSIS — R070 Pain in throat: Secondary | ICD-10-CM | POA: Diagnosis not present

## 2022-01-25 DIAGNOSIS — M19011 Primary osteoarthritis, right shoulder: Secondary | ICD-10-CM | POA: Diagnosis not present

## 2022-01-25 DIAGNOSIS — R682 Dry mouth, unspecified: Secondary | ICD-10-CM | POA: Diagnosis not present

## 2022-01-25 DIAGNOSIS — Z96611 Presence of right artificial shoulder joint: Secondary | ICD-10-CM | POA: Diagnosis not present

## 2022-01-26 DIAGNOSIS — N183 Chronic kidney disease, stage 3 unspecified: Secondary | ICD-10-CM | POA: Diagnosis not present

## 2022-01-26 DIAGNOSIS — I10 Essential (primary) hypertension: Secondary | ICD-10-CM | POA: Diagnosis not present

## 2022-01-26 DIAGNOSIS — C029 Malignant neoplasm of tongue, unspecified: Secondary | ICD-10-CM | POA: Diagnosis not present

## 2022-01-26 DIAGNOSIS — R739 Hyperglycemia, unspecified: Secondary | ICD-10-CM | POA: Diagnosis not present

## 2022-01-26 DIAGNOSIS — M792 Neuralgia and neuritis, unspecified: Secondary | ICD-10-CM | POA: Diagnosis not present

## 2022-01-26 DIAGNOSIS — G3184 Mild cognitive impairment, so stated: Secondary | ICD-10-CM | POA: Diagnosis not present

## 2022-01-26 DIAGNOSIS — I2510 Atherosclerotic heart disease of native coronary artery without angina pectoris: Secondary | ICD-10-CM | POA: Diagnosis not present

## 2022-01-26 DIAGNOSIS — D638 Anemia in other chronic diseases classified elsewhere: Secondary | ICD-10-CM | POA: Diagnosis not present

## 2022-01-26 DIAGNOSIS — M5412 Radiculopathy, cervical region: Secondary | ICD-10-CM | POA: Diagnosis not present

## 2022-01-26 DIAGNOSIS — M5136 Other intervertebral disc degeneration, lumbar region: Secondary | ICD-10-CM | POA: Diagnosis not present

## 2022-02-05 DIAGNOSIS — I7121 Aneurysm of the ascending aorta, without rupture: Secondary | ICD-10-CM | POA: Diagnosis not present

## 2022-02-05 DIAGNOSIS — I25710 Atherosclerosis of autologous vein coronary artery bypass graft(s) with unstable angina pectoris: Secondary | ICD-10-CM | POA: Diagnosis not present

## 2022-02-05 DIAGNOSIS — I714 Abdominal aortic aneurysm, without rupture, unspecified: Secondary | ICD-10-CM | POA: Diagnosis not present

## 2022-02-05 DIAGNOSIS — I10 Essential (primary) hypertension: Secondary | ICD-10-CM | POA: Diagnosis not present

## 2022-02-05 DIAGNOSIS — E7800 Pure hypercholesterolemia, unspecified: Secondary | ICD-10-CM | POA: Diagnosis not present

## 2022-02-05 DIAGNOSIS — I700 Atherosclerosis of aorta: Secondary | ICD-10-CM | POA: Diagnosis not present

## 2022-02-05 DIAGNOSIS — E782 Mixed hyperlipidemia: Secondary | ICD-10-CM | POA: Diagnosis not present

## 2022-02-05 DIAGNOSIS — N1832 Chronic kidney disease, stage 3b: Secondary | ICD-10-CM | POA: Diagnosis not present

## 2022-02-05 DIAGNOSIS — I441 Atrioventricular block, second degree: Secondary | ICD-10-CM | POA: Diagnosis not present

## 2022-02-06 ENCOUNTER — Other Ambulatory Visit: Payer: Self-pay | Admitting: Anesthesiology

## 2022-02-09 DIAGNOSIS — M5412 Radiculopathy, cervical region: Secondary | ICD-10-CM | POA: Diagnosis not present

## 2022-02-09 DIAGNOSIS — R739 Hyperglycemia, unspecified: Secondary | ICD-10-CM | POA: Diagnosis not present

## 2022-02-09 DIAGNOSIS — M5136 Other intervertebral disc degeneration, lumbar region: Secondary | ICD-10-CM | POA: Diagnosis not present

## 2022-02-09 DIAGNOSIS — D638 Anemia in other chronic diseases classified elsewhere: Secondary | ICD-10-CM | POA: Diagnosis not present

## 2022-02-09 DIAGNOSIS — G3184 Mild cognitive impairment, so stated: Secondary | ICD-10-CM | POA: Diagnosis not present

## 2022-02-09 DIAGNOSIS — I10 Essential (primary) hypertension: Secondary | ICD-10-CM | POA: Diagnosis not present

## 2022-02-09 DIAGNOSIS — C029 Malignant neoplasm of tongue, unspecified: Secondary | ICD-10-CM | POA: Diagnosis not present

## 2022-02-09 DIAGNOSIS — N183 Chronic kidney disease, stage 3 unspecified: Secondary | ICD-10-CM | POA: Diagnosis not present

## 2022-02-09 DIAGNOSIS — M792 Neuralgia and neuritis, unspecified: Secondary | ICD-10-CM | POA: Diagnosis not present

## 2022-02-09 DIAGNOSIS — I2510 Atherosclerotic heart disease of native coronary artery without angina pectoris: Secondary | ICD-10-CM | POA: Diagnosis not present

## 2022-02-10 ENCOUNTER — Telehealth: Payer: Self-pay | Admitting: Anesthesiology

## 2022-02-10 NOTE — Telephone Encounter (Signed)
Please call patient to schedule appt. Atlast appt in 10/2021, orders states to return in 3 months. ?

## 2022-02-15 ENCOUNTER — Telehealth: Payer: Self-pay | Admitting: Anesthesiology

## 2022-02-15 NOTE — Telephone Encounter (Signed)
Open in error

## 2022-02-22 ENCOUNTER — Ambulatory Visit: Payer: PPO | Attending: Anesthesiology | Admitting: Anesthesiology

## 2022-02-23 DIAGNOSIS — M5136 Other intervertebral disc degeneration, lumbar region: Secondary | ICD-10-CM | POA: Diagnosis not present

## 2022-02-23 DIAGNOSIS — R739 Hyperglycemia, unspecified: Secondary | ICD-10-CM | POA: Diagnosis not present

## 2022-02-23 DIAGNOSIS — N183 Chronic kidney disease, stage 3 unspecified: Secondary | ICD-10-CM | POA: Diagnosis not present

## 2022-02-23 DIAGNOSIS — G3184 Mild cognitive impairment, so stated: Secondary | ICD-10-CM | POA: Diagnosis not present

## 2022-02-23 DIAGNOSIS — I10 Essential (primary) hypertension: Secondary | ICD-10-CM | POA: Diagnosis not present

## 2022-02-23 DIAGNOSIS — I2510 Atherosclerotic heart disease of native coronary artery without angina pectoris: Secondary | ICD-10-CM | POA: Diagnosis not present

## 2022-02-23 DIAGNOSIS — M5412 Radiculopathy, cervical region: Secondary | ICD-10-CM | POA: Diagnosis not present

## 2022-03-09 DIAGNOSIS — Z79899 Other long term (current) drug therapy: Secondary | ICD-10-CM | POA: Diagnosis not present

## 2022-03-09 DIAGNOSIS — I10 Essential (primary) hypertension: Secondary | ICD-10-CM | POA: Diagnosis not present

## 2022-03-09 DIAGNOSIS — N183 Chronic kidney disease, stage 3 unspecified: Secondary | ICD-10-CM | POA: Diagnosis not present

## 2022-03-09 DIAGNOSIS — E7800 Pure hypercholesterolemia, unspecified: Secondary | ICD-10-CM | POA: Diagnosis not present

## 2022-03-09 DIAGNOSIS — R739 Hyperglycemia, unspecified: Secondary | ICD-10-CM | POA: Diagnosis not present

## 2022-03-15 DIAGNOSIS — N2581 Secondary hyperparathyroidism of renal origin: Secondary | ICD-10-CM | POA: Diagnosis not present

## 2022-03-15 DIAGNOSIS — R809 Proteinuria, unspecified: Secondary | ICD-10-CM | POA: Diagnosis not present

## 2022-03-15 DIAGNOSIS — R319 Hematuria, unspecified: Secondary | ICD-10-CM | POA: Diagnosis not present

## 2022-03-15 DIAGNOSIS — D631 Anemia in chronic kidney disease: Secondary | ICD-10-CM | POA: Diagnosis not present

## 2022-03-15 DIAGNOSIS — I129 Hypertensive chronic kidney disease with stage 1 through stage 4 chronic kidney disease, or unspecified chronic kidney disease: Secondary | ICD-10-CM | POA: Diagnosis not present

## 2022-03-15 DIAGNOSIS — N184 Chronic kidney disease, stage 4 (severe): Secondary | ICD-10-CM | POA: Diagnosis not present

## 2022-03-16 DIAGNOSIS — H353132 Nonexudative age-related macular degeneration, bilateral, intermediate dry stage: Secondary | ICD-10-CM | POA: Diagnosis not present

## 2022-03-22 DIAGNOSIS — I10 Essential (primary) hypertension: Secondary | ICD-10-CM | POA: Diagnosis not present

## 2022-03-22 DIAGNOSIS — D631 Anemia in chronic kidney disease: Secondary | ICD-10-CM | POA: Diagnosis not present

## 2022-03-22 DIAGNOSIS — N2581 Secondary hyperparathyroidism of renal origin: Secondary | ICD-10-CM | POA: Diagnosis not present

## 2022-03-22 DIAGNOSIS — R809 Proteinuria, unspecified: Secondary | ICD-10-CM | POA: Diagnosis not present

## 2022-03-22 DIAGNOSIS — N184 Chronic kidney disease, stage 4 (severe): Secondary | ICD-10-CM | POA: Diagnosis not present

## 2022-04-27 DIAGNOSIS — R682 Dry mouth, unspecified: Secondary | ICD-10-CM | POA: Diagnosis not present

## 2022-04-27 DIAGNOSIS — R070 Pain in throat: Secondary | ICD-10-CM | POA: Diagnosis not present

## 2022-04-27 DIAGNOSIS — H6123 Impacted cerumen, bilateral: Secondary | ICD-10-CM | POA: Diagnosis not present

## 2022-04-27 DIAGNOSIS — C01 Malignant neoplasm of base of tongue: Secondary | ICD-10-CM | POA: Diagnosis not present

## 2022-04-30 DIAGNOSIS — Z96611 Presence of right artificial shoulder joint: Secondary | ICD-10-CM | POA: Diagnosis not present

## 2022-04-30 DIAGNOSIS — G8929 Other chronic pain: Secondary | ICD-10-CM | POA: Diagnosis not present

## 2022-04-30 DIAGNOSIS — M19011 Primary osteoarthritis, right shoulder: Secondary | ICD-10-CM | POA: Diagnosis not present

## 2022-04-30 DIAGNOSIS — M25511 Pain in right shoulder: Secondary | ICD-10-CM | POA: Diagnosis not present

## 2022-04-30 DIAGNOSIS — M7581 Other shoulder lesions, right shoulder: Secondary | ICD-10-CM | POA: Diagnosis not present

## 2022-05-04 DIAGNOSIS — I441 Atrioventricular block, second degree: Secondary | ICD-10-CM | POA: Diagnosis not present

## 2022-06-09 ENCOUNTER — Inpatient Hospital Stay
Admission: EM | Admit: 2022-06-09 | Discharge: 2022-06-15 | DRG: 521 | Disposition: A | Discharge status: Skilled Nursing Facility | Payer: PPO | Attending: Internal Medicine | Admitting: Internal Medicine

## 2022-06-09 DIAGNOSIS — E663 Overweight: Secondary | ICD-10-CM | POA: Diagnosis present

## 2022-06-09 DIAGNOSIS — Z6829 Body mass index (BMI) 29.0-29.9, adult: Secondary | ICD-10-CM

## 2022-06-09 DIAGNOSIS — W010XXA Fall on same level from slipping, tripping and stumbling without subsequent striking against object, initial encounter: Secondary | ICD-10-CM | POA: Diagnosis present

## 2022-06-09 DIAGNOSIS — Z791 Long term (current) use of non-steroidal anti-inflammatories (NSAID): Secondary | ICD-10-CM | POA: Diagnosis not present

## 2022-06-09 DIAGNOSIS — Z86010 Personal history of colonic polyps: Secondary | ICD-10-CM

## 2022-06-09 DIAGNOSIS — D62 Acute posthemorrhagic anemia: Secondary | ICD-10-CM | POA: Diagnosis not present

## 2022-06-09 DIAGNOSIS — Z87891 Personal history of nicotine dependence: Secondary | ICD-10-CM | POA: Diagnosis not present

## 2022-06-09 DIAGNOSIS — C01 Malignant neoplasm of base of tongue: Secondary | ICD-10-CM | POA: Diagnosis present

## 2022-06-09 DIAGNOSIS — F32A Depression, unspecified: Secondary | ICD-10-CM | POA: Diagnosis not present

## 2022-06-09 DIAGNOSIS — W19XXXA Unspecified fall, initial encounter: Secondary | ICD-10-CM

## 2022-06-09 DIAGNOSIS — R066 Hiccough: Secondary | ICD-10-CM | POA: Diagnosis present

## 2022-06-09 DIAGNOSIS — Z9842 Cataract extraction status, left eye: Secondary | ICD-10-CM

## 2022-06-09 DIAGNOSIS — R0689 Other abnormalities of breathing: Secondary | ICD-10-CM | POA: Diagnosis not present

## 2022-06-09 DIAGNOSIS — Z85118 Personal history of other malignant neoplasm of bronchus and lung: Secondary | ICD-10-CM

## 2022-06-09 DIAGNOSIS — S72041A Displaced fracture of base of neck of right femur, initial encounter for closed fracture: Secondary | ICD-10-CM | POA: Diagnosis not present

## 2022-06-09 DIAGNOSIS — G894 Chronic pain syndrome: Secondary | ICD-10-CM | POA: Diagnosis present

## 2022-06-09 DIAGNOSIS — I959 Hypotension, unspecified: Secondary | ICD-10-CM | POA: Diagnosis not present

## 2022-06-09 DIAGNOSIS — I130 Hypertensive heart and chronic kidney disease with heart failure and stage 1 through stage 4 chronic kidney disease, or unspecified chronic kidney disease: Secondary | ICD-10-CM | POA: Diagnosis present

## 2022-06-09 DIAGNOSIS — J449 Chronic obstructive pulmonary disease, unspecified: Secondary | ICD-10-CM | POA: Diagnosis not present

## 2022-06-09 DIAGNOSIS — I5031 Acute diastolic (congestive) heart failure: Secondary | ICD-10-CM | POA: Diagnosis present

## 2022-06-09 DIAGNOSIS — D631 Anemia in chronic kidney disease: Secondary | ICD-10-CM | POA: Diagnosis present

## 2022-06-09 DIAGNOSIS — I509 Heart failure, unspecified: Secondary | ICD-10-CM

## 2022-06-09 DIAGNOSIS — I700 Atherosclerosis of aorta: Secondary | ICD-10-CM | POA: Diagnosis present

## 2022-06-09 DIAGNOSIS — I878 Other specified disorders of veins: Secondary | ICD-10-CM | POA: Diagnosis not present

## 2022-06-09 DIAGNOSIS — R58 Hemorrhage, not elsewhere classified: Secondary | ICD-10-CM | POA: Diagnosis not present

## 2022-06-09 DIAGNOSIS — I712 Thoracic aortic aneurysm, without rupture, unspecified: Secondary | ICD-10-CM | POA: Diagnosis present

## 2022-06-09 DIAGNOSIS — Z825 Family history of asthma and other chronic lower respiratory diseases: Secondary | ICD-10-CM

## 2022-06-09 DIAGNOSIS — S72001A Fracture of unspecified part of neck of right femur, initial encounter for closed fracture: Secondary | ICD-10-CM | POA: Diagnosis present

## 2022-06-09 DIAGNOSIS — M6259 Muscle wasting and atrophy, not elsewhere classified, multiple sites: Secondary | ICD-10-CM | POA: Diagnosis not present

## 2022-06-09 DIAGNOSIS — M50323 Other cervical disc degeneration at C6-C7 level: Secondary | ICD-10-CM | POA: Diagnosis not present

## 2022-06-09 DIAGNOSIS — Z8673 Personal history of transient ischemic attack (TIA), and cerebral infarction without residual deficits: Secondary | ICD-10-CM

## 2022-06-09 DIAGNOSIS — Z85810 Personal history of malignant neoplasm of tongue: Secondary | ICD-10-CM | POA: Diagnosis not present

## 2022-06-09 DIAGNOSIS — M25551 Pain in right hip: Secondary | ICD-10-CM | POA: Diagnosis not present

## 2022-06-09 DIAGNOSIS — N184 Chronic kidney disease, stage 4 (severe): Secondary | ICD-10-CM | POA: Diagnosis present

## 2022-06-09 DIAGNOSIS — I495 Sick sinus syndrome: Secondary | ICD-10-CM | POA: Diagnosis present

## 2022-06-09 DIAGNOSIS — N189 Chronic kidney disease, unspecified: Secondary | ICD-10-CM | POA: Diagnosis not present

## 2022-06-09 DIAGNOSIS — Z471 Aftercare following joint replacement surgery: Secondary | ICD-10-CM | POA: Diagnosis not present

## 2022-06-09 DIAGNOSIS — E611 Iron deficiency: Secondary | ICD-10-CM | POA: Diagnosis not present

## 2022-06-09 DIAGNOSIS — C3490 Malignant neoplasm of unspecified part of unspecified bronchus or lung: Secondary | ICD-10-CM | POA: Diagnosis not present

## 2022-06-09 DIAGNOSIS — Z9079 Acquired absence of other genital organ(s): Secondary | ICD-10-CM

## 2022-06-09 DIAGNOSIS — E785 Hyperlipidemia, unspecified: Secondary | ICD-10-CM | POA: Diagnosis not present

## 2022-06-09 DIAGNOSIS — I10 Essential (primary) hypertension: Secondary | ICD-10-CM | POA: Diagnosis not present

## 2022-06-09 DIAGNOSIS — Z87442 Personal history of urinary calculi: Secondary | ICD-10-CM

## 2022-06-09 DIAGNOSIS — Z902 Acquired absence of lung [part of]: Secondary | ICD-10-CM

## 2022-06-09 DIAGNOSIS — Z96612 Presence of left artificial shoulder joint: Secondary | ICD-10-CM | POA: Diagnosis present

## 2022-06-09 DIAGNOSIS — Z043 Encounter for examination and observation following other accident: Secondary | ICD-10-CM | POA: Diagnosis not present

## 2022-06-09 DIAGNOSIS — Y92009 Unspecified place in unspecified non-institutional (private) residence as the place of occurrence of the external cause: Secondary | ICD-10-CM

## 2022-06-09 DIAGNOSIS — G3184 Mild cognitive impairment, so stated: Secondary | ICD-10-CM | POA: Diagnosis present

## 2022-06-09 DIAGNOSIS — R2681 Unsteadiness on feet: Secondary | ICD-10-CM | POA: Diagnosis not present

## 2022-06-09 DIAGNOSIS — I499 Cardiac arrhythmia, unspecified: Secondary | ICD-10-CM | POA: Diagnosis not present

## 2022-06-09 DIAGNOSIS — Z951 Presence of aortocoronary bypass graft: Secondary | ICD-10-CM | POA: Diagnosis not present

## 2022-06-09 DIAGNOSIS — Z8249 Family history of ischemic heart disease and other diseases of the circulatory system: Secondary | ICD-10-CM

## 2022-06-09 DIAGNOSIS — M50322 Other cervical disc degeneration at C5-C6 level: Secondary | ICD-10-CM | POA: Diagnosis not present

## 2022-06-09 DIAGNOSIS — E875 Hyperkalemia: Secondary | ICD-10-CM | POA: Diagnosis present

## 2022-06-09 DIAGNOSIS — Z7902 Long term (current) use of antithrombotics/antiplatelets: Secondary | ICD-10-CM | POA: Diagnosis not present

## 2022-06-09 DIAGNOSIS — Z79899 Other long term (current) drug therapy: Secondary | ICD-10-CM

## 2022-06-09 DIAGNOSIS — S72001D Fracture of unspecified part of neck of right femur, subsequent encounter for closed fracture with routine healing: Secondary | ICD-10-CM | POA: Diagnosis not present

## 2022-06-09 DIAGNOSIS — S0101XA Laceration without foreign body of scalp, initial encounter: Secondary | ICD-10-CM | POA: Diagnosis present

## 2022-06-09 DIAGNOSIS — I441 Atrioventricular block, second degree: Secondary | ICD-10-CM | POA: Diagnosis present

## 2022-06-09 DIAGNOSIS — I129 Hypertensive chronic kidney disease with stage 1 through stage 4 chronic kidney disease, or unspecified chronic kidney disease: Secondary | ICD-10-CM | POA: Diagnosis not present

## 2022-06-09 DIAGNOSIS — Z923 Personal history of irradiation: Secondary | ICD-10-CM

## 2022-06-09 DIAGNOSIS — Z950 Presence of cardiac pacemaker: Secondary | ICD-10-CM

## 2022-06-09 DIAGNOSIS — Z803 Family history of malignant neoplasm of breast: Secondary | ICD-10-CM

## 2022-06-09 DIAGNOSIS — Z981 Arthrodesis status: Secondary | ICD-10-CM

## 2022-06-09 DIAGNOSIS — N2581 Secondary hyperparathyroidism of renal origin: Secondary | ICD-10-CM | POA: Diagnosis present

## 2022-06-09 DIAGNOSIS — Z7982 Long term (current) use of aspirin: Secondary | ICD-10-CM

## 2022-06-09 DIAGNOSIS — I2510 Atherosclerotic heart disease of native coronary artery without angina pectoris: Secondary | ICD-10-CM | POA: Diagnosis present

## 2022-06-09 DIAGNOSIS — Z9841 Cataract extraction status, right eye: Secondary | ICD-10-CM

## 2022-06-09 DIAGNOSIS — Z96653 Presence of artificial knee joint, bilateral: Secondary | ICD-10-CM | POA: Diagnosis present

## 2022-06-09 DIAGNOSIS — F419 Anxiety disorder, unspecified: Secondary | ICD-10-CM | POA: Diagnosis present

## 2022-06-09 DIAGNOSIS — N179 Acute kidney failure, unspecified: Secondary | ICD-10-CM | POA: Diagnosis present

## 2022-06-09 DIAGNOSIS — Z9889 Other specified postprocedural states: Secondary | ICD-10-CM | POA: Diagnosis not present

## 2022-06-09 DIAGNOSIS — J984 Other disorders of lung: Secondary | ICD-10-CM | POA: Diagnosis not present

## 2022-06-09 DIAGNOSIS — R202 Paresthesia of skin: Secondary | ICD-10-CM | POA: Diagnosis present

## 2022-06-09 DIAGNOSIS — R41841 Cognitive communication deficit: Secondary | ICD-10-CM | POA: Diagnosis not present

## 2022-06-09 DIAGNOSIS — Z8546 Personal history of malignant neoplasm of prostate: Secondary | ICD-10-CM

## 2022-06-09 DIAGNOSIS — K219 Gastro-esophageal reflux disease without esophagitis: Secondary | ICD-10-CM | POA: Diagnosis present

## 2022-06-09 DIAGNOSIS — Z8679 Personal history of other diseases of the circulatory system: Secondary | ICD-10-CM

## 2022-06-09 DIAGNOSIS — Z7401 Bed confinement status: Secondary | ICD-10-CM | POA: Diagnosis not present

## 2022-06-09 DIAGNOSIS — I5032 Chronic diastolic (congestive) heart failure: Secondary | ICD-10-CM | POA: Diagnosis not present

## 2022-06-09 DIAGNOSIS — R4789 Other speech disturbances: Secondary | ICD-10-CM | POA: Diagnosis not present

## 2022-06-09 DIAGNOSIS — Z741 Need for assistance with personal care: Secondary | ICD-10-CM | POA: Diagnosis not present

## 2022-06-09 DIAGNOSIS — E7800 Pure hypercholesterolemia, unspecified: Secondary | ICD-10-CM | POA: Diagnosis present

## 2022-06-09 DIAGNOSIS — S0003XA Contusion of scalp, initial encounter: Secondary | ICD-10-CM | POA: Diagnosis not present

## 2022-06-09 NOTE — ED Triage Notes (Signed)
Pt presents via EMS with complaints of a ground level fall at home. Pt states he got up from the chair too quickly and tripped landing on his right side. Pt hit the back of his head on the night stand and has a laceration to the left posterior aspect of his head. Pt takes plavix. Pt has shortening of the right leg and mild outward rotation. Pt received 21mcg fentanyl & 4mg  zofran PTA. Denies LOC, dizziness, CP, or Sob.

## 2022-06-10 ENCOUNTER — Other Ambulatory Visit: Payer: Self-pay

## 2022-06-10 ENCOUNTER — Inpatient Hospital Stay: Payer: PPO

## 2022-06-10 ENCOUNTER — Encounter: Payer: Self-pay | Admitting: Emergency Medicine

## 2022-06-10 ENCOUNTER — Inpatient Hospital Stay: Payer: PPO | Admitting: Anesthesiology

## 2022-06-10 ENCOUNTER — Emergency Department: Payer: PPO

## 2022-06-10 ENCOUNTER — Encounter
Admission: EM | Disposition: A | Discharge status: Skilled Nursing Facility | Payer: Self-pay | Source: Home / Self Care | Attending: Internal Medicine

## 2022-06-10 DIAGNOSIS — S0003XA Contusion of scalp, initial encounter: Secondary | ICD-10-CM | POA: Diagnosis not present

## 2022-06-10 DIAGNOSIS — W010XXA Fall on same level from slipping, tripping and stumbling without subsequent striking against object, initial encounter: Secondary | ICD-10-CM | POA: Diagnosis present

## 2022-06-10 DIAGNOSIS — Z9889 Other specified postprocedural states: Secondary | ICD-10-CM | POA: Diagnosis not present

## 2022-06-10 DIAGNOSIS — M25551 Pain in right hip: Secondary | ICD-10-CM | POA: Diagnosis not present

## 2022-06-10 DIAGNOSIS — S0101XA Laceration without foreign body of scalp, initial encounter: Secondary | ICD-10-CM | POA: Diagnosis present

## 2022-06-10 DIAGNOSIS — I10 Essential (primary) hypertension: Secondary | ICD-10-CM | POA: Diagnosis not present

## 2022-06-10 DIAGNOSIS — D62 Acute posthemorrhagic anemia: Secondary | ICD-10-CM | POA: Diagnosis not present

## 2022-06-10 DIAGNOSIS — Y92009 Unspecified place in unspecified non-institutional (private) residence as the place of occurrence of the external cause: Secondary | ICD-10-CM | POA: Diagnosis not present

## 2022-06-10 DIAGNOSIS — D631 Anemia in chronic kidney disease: Secondary | ICD-10-CM | POA: Diagnosis present

## 2022-06-10 DIAGNOSIS — S72001A Fracture of unspecified part of neck of right femur, initial encounter for closed fracture: Secondary | ICD-10-CM | POA: Diagnosis present

## 2022-06-10 DIAGNOSIS — I5031 Acute diastolic (congestive) heart failure: Secondary | ICD-10-CM | POA: Diagnosis present

## 2022-06-10 DIAGNOSIS — I700 Atherosclerosis of aorta: Secondary | ICD-10-CM | POA: Diagnosis present

## 2022-06-10 DIAGNOSIS — E663 Overweight: Secondary | ICD-10-CM | POA: Diagnosis present

## 2022-06-10 DIAGNOSIS — Z902 Acquired absence of lung [part of]: Secondary | ICD-10-CM | POA: Diagnosis not present

## 2022-06-10 DIAGNOSIS — Z043 Encounter for examination and observation following other accident: Secondary | ICD-10-CM | POA: Diagnosis not present

## 2022-06-10 DIAGNOSIS — J984 Other disorders of lung: Secondary | ICD-10-CM | POA: Diagnosis not present

## 2022-06-10 DIAGNOSIS — Z923 Personal history of irradiation: Secondary | ICD-10-CM | POA: Diagnosis not present

## 2022-06-10 DIAGNOSIS — Z951 Presence of aortocoronary bypass graft: Secondary | ICD-10-CM | POA: Diagnosis not present

## 2022-06-10 DIAGNOSIS — G3184 Mild cognitive impairment, so stated: Secondary | ICD-10-CM | POA: Diagnosis present

## 2022-06-10 DIAGNOSIS — N2581 Secondary hyperparathyroidism of renal origin: Secondary | ICD-10-CM | POA: Diagnosis present

## 2022-06-10 DIAGNOSIS — I130 Hypertensive heart and chronic kidney disease with heart failure and stage 1 through stage 4 chronic kidney disease, or unspecified chronic kidney disease: Secondary | ICD-10-CM | POA: Diagnosis present

## 2022-06-10 DIAGNOSIS — M50322 Other cervical disc degeneration at C5-C6 level: Secondary | ICD-10-CM | POA: Diagnosis not present

## 2022-06-10 DIAGNOSIS — Z7902 Long term (current) use of antithrombotics/antiplatelets: Secondary | ICD-10-CM | POA: Diagnosis not present

## 2022-06-10 DIAGNOSIS — Z6829 Body mass index (BMI) 29.0-29.9, adult: Secondary | ICD-10-CM | POA: Diagnosis not present

## 2022-06-10 DIAGNOSIS — I878 Other specified disorders of veins: Secondary | ICD-10-CM | POA: Diagnosis not present

## 2022-06-10 DIAGNOSIS — N179 Acute kidney failure, unspecified: Secondary | ICD-10-CM | POA: Diagnosis present

## 2022-06-10 DIAGNOSIS — M50323 Other cervical disc degeneration at C6-C7 level: Secondary | ICD-10-CM | POA: Diagnosis not present

## 2022-06-10 DIAGNOSIS — N184 Chronic kidney disease, stage 4 (severe): Secondary | ICD-10-CM | POA: Diagnosis present

## 2022-06-10 DIAGNOSIS — Z981 Arthrodesis status: Secondary | ICD-10-CM | POA: Diagnosis not present

## 2022-06-10 DIAGNOSIS — I441 Atrioventricular block, second degree: Secondary | ICD-10-CM | POA: Diagnosis present

## 2022-06-10 DIAGNOSIS — S72041A Displaced fracture of base of neck of right femur, initial encounter for closed fracture: Secondary | ICD-10-CM | POA: Diagnosis not present

## 2022-06-10 DIAGNOSIS — Z950 Presence of cardiac pacemaker: Secondary | ICD-10-CM | POA: Diagnosis present

## 2022-06-10 DIAGNOSIS — R066 Hiccough: Secondary | ICD-10-CM | POA: Diagnosis present

## 2022-06-10 DIAGNOSIS — Z471 Aftercare following joint replacement surgery: Secondary | ICD-10-CM | POA: Diagnosis not present

## 2022-06-10 DIAGNOSIS — Z791 Long term (current) use of non-steroidal anti-inflammatories (NSAID): Secondary | ICD-10-CM | POA: Diagnosis not present

## 2022-06-10 DIAGNOSIS — Z85810 Personal history of malignant neoplasm of tongue: Secondary | ICD-10-CM | POA: Diagnosis not present

## 2022-06-10 DIAGNOSIS — I509 Heart failure, unspecified: Secondary | ICD-10-CM | POA: Diagnosis not present

## 2022-06-10 DIAGNOSIS — I495 Sick sinus syndrome: Secondary | ICD-10-CM | POA: Diagnosis present

## 2022-06-10 DIAGNOSIS — I2510 Atherosclerotic heart disease of native coronary artery without angina pectoris: Secondary | ICD-10-CM | POA: Diagnosis present

## 2022-06-10 HISTORY — PX: HIP ARTHROPLASTY: SHX981

## 2022-06-10 LAB — CBC WITH DIFFERENTIAL/PLATELET
Abs Immature Granulocytes: 0.05 10*3/uL (ref 0.00–0.07)
Basophils Absolute: 0 10*3/uL (ref 0.0–0.1)
Basophils Relative: 1 %
Eosinophils Absolute: 0.1 10*3/uL (ref 0.0–0.5)
Eosinophils Relative: 1 %
HCT: 36.9 % — ABNORMAL LOW (ref 39.0–52.0)
Hemoglobin: 11.5 g/dL — ABNORMAL LOW (ref 13.0–17.0)
Immature Granulocytes: 1 %
Lymphocytes Relative: 14 %
Lymphs Abs: 0.9 10*3/uL (ref 0.7–4.0)
MCH: 28.9 pg (ref 26.0–34.0)
MCHC: 31.2 g/dL (ref 30.0–36.0)
MCV: 92.7 fL (ref 80.0–100.0)
Monocytes Absolute: 0.5 10*3/uL (ref 0.1–1.0)
Monocytes Relative: 8 %
Neutro Abs: 5 10*3/uL (ref 1.7–7.7)
Neutrophils Relative %: 75 %
Platelets: 167 10*3/uL (ref 150–400)
RBC: 3.98 MIL/uL — ABNORMAL LOW (ref 4.22–5.81)
RDW: 14.9 % (ref 11.5–15.5)
WBC: 6.6 10*3/uL (ref 4.0–10.5)
nRBC: 0 % (ref 0.0–0.2)

## 2022-06-10 LAB — BASIC METABOLIC PANEL
Anion gap: 10 (ref 5–15)
BUN: 31 mg/dL — ABNORMAL HIGH (ref 8–23)
CO2: 21 mmol/L — ABNORMAL LOW (ref 22–32)
Calcium: 8.9 mg/dL (ref 8.9–10.3)
Chloride: 109 mmol/L (ref 98–111)
Creatinine, Ser: 2.76 mg/dL — ABNORMAL HIGH (ref 0.61–1.24)
GFR, Estimated: 22 mL/min — ABNORMAL LOW (ref >60)
Glucose, Bld: 124 mg/dL — ABNORMAL HIGH (ref 70–99)
Potassium: 4.4 mmol/L (ref 3.5–5.1)
Sodium: 140 mmol/L (ref 135–145)

## 2022-06-10 LAB — COMPREHENSIVE METABOLIC PANEL
ALT: 20 U/L (ref 0–44)
AST: 19 U/L (ref 15–41)
Albumin: 3.7 g/dL (ref 3.5–5.0)
Alkaline Phosphatase: 68 U/L (ref 38–126)
Anion gap: 8 (ref 5–15)
BUN: 32 mg/dL — ABNORMAL HIGH (ref 8–23)
CO2: 25 mmol/L (ref 22–32)
Calcium: 9.3 mg/dL (ref 8.9–10.3)
Chloride: 109 mmol/L (ref 98–111)
Creatinine, Ser: 2.96 mg/dL — ABNORMAL HIGH (ref 0.61–1.24)
GFR, Estimated: 21 mL/min — ABNORMAL LOW (ref >60)
Glucose, Bld: 123 mg/dL — ABNORMAL HIGH (ref 70–99)
Potassium: 4.7 mmol/L (ref 3.5–5.1)
Sodium: 142 mmol/L (ref 135–145)
Total Bilirubin: 0.4 mg/dL (ref 0.3–1.2)
Total Protein: 6.4 g/dL — ABNORMAL LOW (ref 6.5–8.1)

## 2022-06-10 LAB — TYPE AND SCREEN
ABO/RH(D): O POS
Antibody Screen: NEGATIVE

## 2022-06-10 LAB — PROTIME-INR
INR: 1 (ref 0.8–1.2)
Prothrombin Time: 13.4 seconds (ref 11.4–15.2)

## 2022-06-10 LAB — SURGICAL PCR SCREEN
MRSA, PCR: NEGATIVE
Staphylococcus aureus: NEGATIVE

## 2022-06-10 LAB — TROPONIN I (HIGH SENSITIVITY)
Troponin I (High Sensitivity): 17 ng/L (ref <18)
Troponin I (High Sensitivity): 18 ng/L — ABNORMAL HIGH (ref <18)

## 2022-06-10 SURGERY — ARTHROPLASTY BIPOLAR HIP (HEMIARTHROPLASTY)
Anesthesia: General | Site: Hip | Laterality: Right

## 2022-06-10 MED ORDER — FERROUS SULFATE 325 (65 FE) MG PO TABS
325.0000 mg | ORAL_TABLET | Freq: Two times a day (BID) | ORAL | Status: DC
  Administered 2022-06-10 – 2022-06-15 (×10): 325 mg via ORAL
  Filled 2022-06-10 (×10): qty 1

## 2022-06-10 MED ORDER — TRIAMCINOLONE ACETONIDE 40 MG/ML IJ SUSP
INTRAMUSCULAR | Status: DC | PRN
  Administered 2022-06-10: 40 mg via INTRAMUSCULAR

## 2022-06-10 MED ORDER — ONDANSETRON HCL 4 MG/2ML IJ SOLN
INTRAMUSCULAR | Status: AC
  Filled 2022-06-10: qty 2

## 2022-06-10 MED ORDER — MELATONIN 5 MG PO TABS
10.0000 mg | ORAL_TABLET | Freq: Every day | ORAL | Status: DC
Start: 2022-06-10 — End: 2022-06-15
  Administered 2022-06-10 – 2022-06-14 (×5): 10 mg via ORAL
  Filled 2022-06-10 (×6): qty 2

## 2022-06-10 MED ORDER — PROPOFOL 10 MG/ML IV BOLUS
INTRAVENOUS | Status: DC | PRN
  Administered 2022-06-10: 80 mg via INTRAVENOUS

## 2022-06-10 MED ORDER — ACETAMINOPHEN 10 MG/ML IV SOLN
INTRAVENOUS | Status: DC | PRN
  Administered 2022-06-10: 650 mg via INTRAVENOUS

## 2022-06-10 MED ORDER — DROPERIDOL 2.5 MG/ML IJ SOLN: 0.6250 mg | Freq: Once | INTRAMUSCULAR | Status: DC | PRN

## 2022-06-10 MED ORDER — DIPHENHYDRAMINE HCL 12.5 MG/5ML PO ELIX: 12.5000 mg | ORAL_SOLUTION | ORAL | Status: DC | PRN

## 2022-06-10 MED ORDER — DEXMEDETOMIDINE (PRECEDEX) IN NS 20 MCG/5ML (4 MCG/ML) IV SYRINGE
PREFILLED_SYRINGE | INTRAVENOUS | Status: DC | PRN
  Administered 2022-06-10 (×2): 4 ug via INTRAVENOUS
  Administered 2022-06-10: 8 ug via INTRAVENOUS
  Administered 2022-06-10: 4 ug via INTRAVENOUS

## 2022-06-10 MED ORDER — KETOROLAC TROMETHAMINE 30 MG/ML IJ SOLN
INTRAMUSCULAR | Status: DC | PRN
  Administered 2022-06-10: 15 mg via INTRAVENOUS

## 2022-06-10 MED ORDER — RISAQUAD PO CAPS
1.0000 | ORAL_CAPSULE | Freq: Every day | ORAL | Status: DC
  Administered 2022-06-11 – 2022-06-15 (×5): 1 via ORAL
  Filled 2022-06-10 (×5): qty 1

## 2022-06-10 MED ORDER — TRIAMCINOLONE ACETONIDE 40 MG/ML IJ SUSP
INTRAMUSCULAR | Status: AC
  Filled 2022-06-10: qty 1

## 2022-06-10 MED ORDER — LACTATED RINGERS IV SOLN: INTRAVENOUS | Status: DC | PRN

## 2022-06-10 MED ORDER — ONDANSETRON HCL 4 MG PO TABS: 4.0000 mg | ORAL_TABLET | Freq: Four times a day (QID) | ORAL | Status: DC | PRN

## 2022-06-10 MED ORDER — ATORVASTATIN CALCIUM 20 MG PO TABS
40.0000 mg | ORAL_TABLET | Freq: Every day | ORAL | Status: DC
  Administered 2022-06-10 – 2022-06-14 (×5): 40 mg via ORAL
  Filled 2022-06-10 (×5): qty 2

## 2022-06-10 MED ORDER — PHENYLEPHRINE HCL-NACL 20-0.9 MG/250ML-% IV SOLN
INTRAVENOUS | Status: DC | PRN
  Administered 2022-06-10: 25 ug/min via INTRAVENOUS

## 2022-06-10 MED ORDER — ONDANSETRON HCL 4 MG/2ML IJ SOLN
4.0000 mg | Freq: Four times a day (QID) | INTRAMUSCULAR | Status: DC | PRN
  Administered 2022-06-10: 4 mg via INTRAVENOUS

## 2022-06-10 MED ORDER — METOPROLOL SUCCINATE ER 25 MG PO TB24
25.0000 mg | ORAL_TABLET | Freq: Every day | ORAL | Status: DC
  Administered 2022-06-10 – 2022-06-15 (×6): 25 mg via ORAL
  Filled 2022-06-10 (×6): qty 1

## 2022-06-10 MED ORDER — MAGNESIUM HYDROXIDE 400 MG/5ML PO SUSP
30.0000 mL | Freq: Every day | ORAL | Status: DC | PRN
  Administered 2022-06-11: 30 mL via ORAL
  Filled 2022-06-10: qty 30

## 2022-06-10 MED ORDER — OYSTER SHELL CALCIUM/D3 500-5 MG-MCG PO TABS
1.0000 | ORAL_TABLET | Freq: Every day | ORAL | Status: DC
  Administered 2022-06-11 – 2022-06-15 (×5): 1 via ORAL
  Filled 2022-06-10 (×5): qty 1

## 2022-06-10 MED ORDER — LIDOCAINE HCL (CARDIAC) PF 100 MG/5ML IV SOSY
PREFILLED_SYRINGE | INTRAVENOUS | Status: DC | PRN
  Administered 2022-06-10: 100 mg via INTRAVENOUS

## 2022-06-10 MED ORDER — ONDANSETRON HCL 4 MG/2ML IJ SOLN: 4.0000 mg | Freq: Four times a day (QID) | INTRAMUSCULAR | Status: DC | PRN

## 2022-06-10 MED ORDER — CEFAZOLIN SODIUM-DEXTROSE 2-4 GM/100ML-% IV SOLN
2.0000 g | Freq: Four times a day (QID) | INTRAVENOUS | Status: AC
  Administered 2022-06-10 – 2022-06-11 (×3): 2 g via INTRAVENOUS
  Filled 2022-06-10 (×3): qty 100

## 2022-06-10 MED ORDER — ADULT MULTIVITAMIN W/MINERALS CH: 1.0000 | ORAL_TABLET | Freq: Every day | ORAL | Status: DC

## 2022-06-10 MED ORDER — BUPIVACAINE LIPOSOME 1.3 % IJ SUSP
INTRAMUSCULAR | Status: DC | PRN
  Administered 2022-06-10: 20 mL

## 2022-06-10 MED ORDER — LACTATED RINGERS IV BOLUS
1000.0000 mL | Freq: Once | INTRAVENOUS | Status: AC
  Administered 2022-06-10: 1000 mL via INTRAVENOUS

## 2022-06-10 MED ORDER — MORPHINE SULFATE (PF) 2 MG/ML IV SOLN
2.0000 mg | INTRAVENOUS | Status: AC | PRN
  Administered 2022-06-10 (×2): 2 mg via INTRAVENOUS
  Filled 2022-06-10 (×2): qty 1

## 2022-06-10 MED ORDER — ENSURE ENLIVE PO LIQD
237.0000 mL | Freq: Two times a day (BID) | ORAL | Status: DC
  Administered 2022-06-11 – 2022-06-15 (×7): 237 mL via ORAL
  Filled 2022-06-10 (×4): qty 237

## 2022-06-10 MED ORDER — CEFAZOLIN SODIUM-DEXTROSE 2-4 GM/100ML-% IV SOLN
2.0000 g | INTRAVENOUS | Status: AC
  Administered 2022-06-10: 2 g via INTRAVENOUS
  Filled 2022-06-10: qty 100

## 2022-06-10 MED ORDER — FENTANYL CITRATE (PF) 100 MCG/2ML IJ SOLN
INTRAMUSCULAR | Status: AC
  Filled 2022-06-10: qty 2

## 2022-06-10 MED ORDER — HYDROCODONE-ACETAMINOPHEN 5-325 MG PO TABS
1.0000 | ORAL_TABLET | Freq: Four times a day (QID) | ORAL | Status: DC | PRN
  Administered 2022-06-11 – 2022-06-12 (×3): 1 via ORAL
  Filled 2022-06-10 (×4): qty 1
  Filled 2022-06-10 (×2): qty 2

## 2022-06-10 MED ORDER — DONEPEZIL HCL 5 MG PO TABS
10.0000 mg | ORAL_TABLET | Freq: Every day | ORAL | Status: DC
  Administered 2022-06-10 – 2022-06-14 (×5): 10 mg via ORAL
  Filled 2022-06-10 (×5): qty 2

## 2022-06-10 MED ORDER — ROCURONIUM BROMIDE 100 MG/10ML IV SOLN
INTRAVENOUS | Status: DC | PRN
  Administered 2022-06-10: 20 mg via INTRAVENOUS
  Administered 2022-06-10: 50 mg via INTRAVENOUS

## 2022-06-10 MED ORDER — ACETAMINOPHEN 10 MG/ML IV SOLN
INTRAVENOUS | Status: AC
  Filled 2022-06-10: qty 100

## 2022-06-10 MED ORDER — SODIUM CHLORIDE 0.9 % IV BOLUS
1000.0000 mL | Freq: Once | INTRAVENOUS | Status: AC
  Administered 2022-06-10: 1000 mL via INTRAVENOUS

## 2022-06-10 MED ORDER — APIXABAN 5 MG PO TABS: 5.0000 mg | ORAL_TABLET | Freq: Two times a day (BID) | ORAL | Status: DC

## 2022-06-10 MED ORDER — SUGAMMADEX SODIUM 200 MG/2ML IV SOLN
INTRAVENOUS | Status: DC | PRN
  Administered 2022-06-10: 200 mg via INTRAVENOUS

## 2022-06-10 MED ORDER — METOCLOPRAMIDE HCL 5 MG/ML IJ SOLN: 5.0000 mg | Freq: Three times a day (TID) | INTRAMUSCULAR | Status: DC | PRN

## 2022-06-10 MED ORDER — DOCUSATE SODIUM 100 MG PO CAPS
100.0000 mg | ORAL_CAPSULE | Freq: Two times a day (BID) | ORAL | Status: DC
  Administered 2022-06-10 – 2022-06-15 (×10): 100 mg via ORAL
  Filled 2022-06-10 (×10): qty 1

## 2022-06-10 MED ORDER — SODIUM CHLORIDE 0.9 % IV SOLN: INTRAVENOUS | Status: DC

## 2022-06-10 MED ORDER — FENTANYL CITRATE (PF) 100 MCG/2ML IJ SOLN
INTRAMUSCULAR | Status: DC | PRN
  Administered 2022-06-10 (×3): 25 ug via INTRAVENOUS
  Administered 2022-06-10: 100 ug via INTRAVENOUS

## 2022-06-10 MED ORDER — FLEET ENEMA 7-19 GM/118ML RE ENEM: 1.0000 | ENEMA | Freq: Once | RECTAL | Status: DC | PRN

## 2022-06-10 MED ORDER — SODIUM CHLORIDE FLUSH 0.9 % IV SOLN
INTRAVENOUS | Status: AC
  Filled 2022-06-10: qty 10

## 2022-06-10 MED ORDER — ROCURONIUM BROMIDE 10 MG/ML (PF) SYRINGE
PREFILLED_SYRINGE | INTRAVENOUS | Status: AC
  Filled 2022-06-10: qty 10

## 2022-06-10 MED ORDER — DEXAMETHASONE SODIUM PHOSPHATE 10 MG/ML IJ SOLN
INTRAMUSCULAR | Status: AC
  Filled 2022-06-10: qty 1

## 2022-06-10 MED ORDER — BUPIVACAINE-EPINEPHRINE (PF) 0.5% -1:200000 IJ SOLN
INTRAMUSCULAR | Status: DC | PRN
  Administered 2022-06-10: 30 mL via PERINEURAL

## 2022-06-10 MED ORDER — BUPIVACAINE LIPOSOME 1.3 % IJ SUSP
INTRAMUSCULAR | Status: AC
  Filled 2022-06-10: qty 20

## 2022-06-10 MED ORDER — MORPHINE SULFATE (PF) 4 MG/ML IV SOLN
4.0000 mg | INTRAVENOUS | Status: DC | PRN
  Administered 2022-06-10: 4 mg via INTRAVENOUS
  Filled 2022-06-10: qty 1

## 2022-06-10 MED ORDER — MORPHINE SULFATE (PF) 2 MG/ML IV SOLN
2.0000 mg | INTRAVENOUS | Status: DC | PRN
  Administered 2022-06-10 – 2022-06-11 (×3): 2 mg via INTRAVENOUS
  Filled 2022-06-10 (×3): qty 1

## 2022-06-10 MED ORDER — TRANEXAMIC ACID 1000 MG/10ML IV SOLN
INTRAVENOUS | Status: DC | PRN
  Administered 2022-06-10: 1000 mg via TOPICAL

## 2022-06-10 MED ORDER — PROPOFOL 10 MG/ML IV BOLUS
INTRAVENOUS | Status: AC
  Filled 2022-06-10: qty 20

## 2022-06-10 MED ORDER — ADULT MULTIVITAMIN W/MINERALS CH
1.0000 | ORAL_TABLET | Freq: Every day | ORAL | Status: DC
  Administered 2022-06-11 – 2022-06-15 (×5): 1 via ORAL
  Filled 2022-06-10 (×5): qty 1

## 2022-06-10 MED ORDER — SODIUM CHLORIDE 0.9 % IV SOLN
INTRAVENOUS | Status: DC
Start: 2022-06-10 — End: 2022-06-11

## 2022-06-10 MED ORDER — OXYCODONE HCL 5 MG/5ML PO SOLN: 5.0000 mg | Freq: Once | ORAL | Status: DC | PRN

## 2022-06-10 MED ORDER — NAPHAZOLINE-GLYCERIN 0.012-0.25 % OP SOLN
1.0000 [drp] | Freq: Four times a day (QID) | OPHTHALMIC | Status: DC | PRN
  Administered 2022-06-10: 2 [drp] via OPHTHALMIC
  Filled 2022-06-10: qty 15

## 2022-06-10 MED ORDER — ACETAMINOPHEN 10 MG/ML IV SOLN: 1000.0000 mg | Freq: Once | INTRAVENOUS | Status: DC | PRN

## 2022-06-10 MED ORDER — METOCLOPRAMIDE HCL 5 MG PO TABS: 5.0000 mg | ORAL_TABLET | Freq: Three times a day (TID) | ORAL | Status: DC | PRN

## 2022-06-10 MED ORDER — LOSARTAN POTASSIUM 50 MG PO TABS: 100.0000 mg | ORAL_TABLET | Freq: Every day | ORAL | Status: DC

## 2022-06-10 MED ORDER — SODIUM CHLORIDE (PF) 0.9 % IJ SOLN
INTRAMUSCULAR | Status: DC | PRN
  Administered 2022-06-10: 10 mL via INTRAVENOUS

## 2022-06-10 MED ORDER — APIXABAN 2.5 MG PO TABS
2.5000 mg | ORAL_TABLET | Freq: Two times a day (BID) | ORAL | Status: DC
  Administered 2022-06-11 – 2022-06-15 (×9): 2.5 mg via ORAL
  Filled 2022-06-10 (×9): qty 1

## 2022-06-10 MED ORDER — OXYCODONE HCL 5 MG PO TABS: 5.0000 mg | ORAL_TABLET | Freq: Once | ORAL | Status: DC | PRN

## 2022-06-10 MED ORDER — ACETAMINOPHEN 500 MG PO TABS: 1000.0000 mg | ORAL_TABLET | Freq: Once | ORAL | Status: DC

## 2022-06-10 MED ORDER — LIDOCAINE HCL (PF) 2 % IJ SOLN
INTRAMUSCULAR | Status: AC
  Filled 2022-06-10: qty 5

## 2022-06-10 MED ORDER — PANTOPRAZOLE SODIUM 40 MG PO TBEC
40.0000 mg | DELAYED_RELEASE_TABLET | Freq: Every day | ORAL | Status: DC
  Administered 2022-06-11 – 2022-06-15 (×5): 40 mg via ORAL
  Filled 2022-06-10 (×5): qty 1

## 2022-06-10 MED ORDER — KETOROLAC TROMETHAMINE 30 MG/ML IJ SOLN
INTRAMUSCULAR | Status: AC
  Filled 2022-06-10: qty 1

## 2022-06-10 MED ORDER — TRANEXAMIC ACID 1000 MG/10ML IV SOLN
INTRAVENOUS | Status: AC
  Filled 2022-06-10: qty 10

## 2022-06-10 MED ORDER — BUPIVACAINE-EPINEPHRINE (PF) 0.5% -1:200000 IJ SOLN
INTRAMUSCULAR | Status: AC
  Filled 2022-06-10: qty 30

## 2022-06-10 MED ORDER — EZETIMIBE 10 MG PO TABS
10.0000 mg | ORAL_TABLET | Freq: Every day | ORAL | Status: DC
  Administered 2022-06-11 – 2022-06-15 (×5): 10 mg via ORAL
  Filled 2022-06-10 (×6): qty 1

## 2022-06-10 MED ORDER — DEXAMETHASONE SODIUM PHOSPHATE 10 MG/ML IJ SOLN
INTRAMUSCULAR | Status: DC | PRN
  Administered 2022-06-10: 5 mg via INTRAVENOUS

## 2022-06-10 MED ORDER — PHENYLEPHRINE 80 MCG/ML (10ML) SYRINGE FOR IV PUSH (FOR BLOOD PRESSURE SUPPORT)
PREFILLED_SYRINGE | INTRAVENOUS | Status: DC | PRN
  Administered 2022-06-10: 80 ug via INTRAVENOUS
  Administered 2022-06-10: 160 ug via INTRAVENOUS
  Administered 2022-06-10: 80 ug via INTRAVENOUS
  Administered 2022-06-10: 160 ug via INTRAVENOUS

## 2022-06-10 MED ORDER — BISACODYL 10 MG RE SUPP: 10.0000 mg | Freq: Every day | RECTAL | Status: DC | PRN

## 2022-06-10 MED ORDER — FENTANYL CITRATE (PF) 100 MCG/2ML IJ SOLN: 25.0000 ug | INTRAMUSCULAR | Status: DC | PRN

## 2022-06-10 MED ORDER — CALCITRIOL 0.25 MCG PO CAPS
0.2500 ug | ORAL_CAPSULE | Freq: Every day | ORAL | Status: DC
Start: 2022-06-10 — End: 2022-06-15
  Administered 2022-06-11 – 2022-06-15 (×5): 0.25 ug via ORAL
  Filled 2022-06-10 (×5): qty 1

## 2022-06-10 SURGICAL SUPPLY — 64 items
BAG DECANTER FOR FLEXI CONT (MISCELLANEOUS)
BLADE SAGITTAL WIDE XTHICK NO (BLADE) ×2
BLADE SAW SAG 25.4X90 (BLADE) ×2
BLADE SURG SZ20 CARB STEEL (BLADE) ×2
BNDG COHESIVE 6X5 TAN ST LF (GAUZE/BANDAGES/DRESSINGS) ×2
BOWL CEMENT MIXING ADV NOZZLE (MISCELLANEOUS)
CHLORAPREP W/TINT 26 (MISCELLANEOUS) ×4
DRAPE 3/4 80X56 (DRAPES) ×2
DRAPE IMP U-DRAPE 54X76 (DRAPES) ×4
DRAPE INCISE IOBAN 66X60 STRL (DRAPES) ×2
DRAPE SURG 17X11 SM STRL (DRAPES) ×2
DRAPE SURG 17X23 STRL (DRAPES) ×2
DRSG OPSITE POSTOP 4X10 (GAUZE/BANDAGES/DRESSINGS) ×2
DRSG OPSITE POSTOP 4X12 (GAUZE/BANDAGES/DRESSINGS)
DRSG OPSITE POSTOP 4X14 (GAUZE/BANDAGES/DRESSINGS)
DRSG OPSITE POSTOP 4X8 (GAUZE/BANDAGES/DRESSINGS) ×2
ELECT CAUTERY BLADE 6.4 (BLADE) ×2
ELECT REM PT RETURN 9FT ADLT (ELECTROSURGICAL) ×2
GAUZE 4X4 16PLY ~~LOC~~+RFID DBL (SPONGE) ×2
GAUZE PACK 2X3YD (PACKING)
GLOVE BIO SURGEON STRL SZ8 (GLOVE) ×6
GLOVE BIOGEL M STRL SZ7.5 (GLOVE)
GLOVE BIOGEL PI IND STRL 8 (GLOVE)
GLOVE BIOGEL PI INDICATOR 8 (GLOVE)
GLOVE SURG UNDER LTX SZ8 (GLOVE) ×2
GOWN STRL REUS W/ TWL LRG LVL3 (GOWN DISPOSABLE) ×1
GOWN STRL REUS W/ TWL XL LVL3 (GOWN DISPOSABLE) ×1
GOWN STRL REUS W/TWL LRG LVL3 (GOWN DISPOSABLE) ×1
GOWN STRL REUS W/TWL XL LVL3 (GOWN DISPOSABLE) ×1
HEAD MODULAR STD 28MM (Orthopedic Implant) ×2 IMPLANT
HOLSTER ELECTROSUGICAL PENCIL (MISCELLANEOUS) ×2
HOOD PEEL AWAY FLYTE STAYCOOL (MISCELLANEOUS) ×2
IV NS 100ML SINGLE PACK (IV SOLUTION)
IV NS IRRIG 3000ML ARTHROMATIC (IV SOLUTION) ×4
LABEL OR SOLS (LABEL) ×2
MANIFOLD NEPTUNE II (INSTRUMENTS) ×2
NDL SAFETY ECLIPSE 18X1.5 (NEEDLE) ×1
NEEDLE FILTER BLUNT 18X 1/2SAF (NEEDLE) ×1
NEEDLE FILTER BLUNT 18X1 1/2 (NEEDLE) ×1
NEEDLE HYPO 18GX1.5 SHARP (NEEDLE) ×1
NEEDLE SPNL 20GX3.5 QUINCKE YW (NEEDLE) ×2
NS IRRIG 1000ML POUR BTL (IV SOLUTION) ×2
PACK HIP PROSTHESIS (MISCELLANEOUS) ×2
PENCIL SMOKE EVACUATOR (MISCELLANEOUS) ×2
PULSAVAC PLUS IRRIG FAN TIP (DISPOSABLE) ×2
RINGBLOC BI POLAR 28X51MM (Orthopedic Implant) ×2 IMPLANT
SPIKE FLUID TRANSFER (MISCELLANEOUS) ×4
SPONGE T-LAP 18X18 ~~LOC~~+RFID (SPONGE) ×8
STAPLER SKIN PROX 35W (STAPLE) ×2
STEM COLLARLESS RED 15X155X130 (Stem) ×2 IMPLANT
STRAP SAFETY 5IN WIDE (MISCELLANEOUS) ×2
SUT TICRON 2-0 30IN 311381 (SUTURE) ×2
SUT VIC AB 1 CT1 36 (SUTURE)
SUT VIC AB 2-0 CT1 (SUTURE) ×8
SUT VIC AB 2-0 CT1 27 (SUTURE)
SUT VIC AB 2-0 CT1 TAPERPNT 27 (SUTURE)
SUT VICRYL 1-0 27IN ABS (SUTURE) ×4
SYR 10ML LL (SYRINGE) ×2
SYR 30ML LL (SYRINGE) ×6
SYR TB 1ML 27GX1/2 LL (SYRINGE)
TAPE TRANSPORE STRL 2 31045 (GAUZE/BANDAGES/DRESSINGS) ×2
TIP BRUSH PULSAVAC PLUS 24.33 (MISCELLANEOUS)
TRAP FLUID SMOKE EVACUATOR (MISCELLANEOUS) ×2
WATER STERILE IRR 500ML POUR (IV SOLUTION) ×2

## 2022-06-10 NOTE — Assessment & Plan Note (Addendum)
Patient denies chest pain or shortness of breath.  Has good functional capacity Last seen by his cardiologist in March 2023 Currently on aspirin and atorvastatin and metoprolol

## 2022-06-10 NOTE — Plan of Care (Signed)

## 2022-06-10 NOTE — Assessment & Plan Note (Signed)
S/p lobectomy.  Follows with oncology

## 2022-06-10 NOTE — H&P (Signed)
History and Physical    Patient: Rick Mcbride. WUX:[id number] DOB: Sep 08, 1941 DOA: 06/09/2022 DOS: the patient was seen and examined on 06/10/2022 PCP: Idelle Crouch, MD  Patient coming from: Home  Chief Complaint:  Chief Complaint  Patient presents with   Fall    HPI: Rick Mcbride. is a 81 y.o. 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  He fell and hit the back of his head on a nightstand sustaining a laceration to the scalp but did not lose consciousness.  He denied preceding lightheadedness, palpitations, chest pain or shortness of breath, headache visual disturbance or one-sided weakness numbness or tingling.  He fell onto his right side sustaining immediate onset of pain of the right hip. ED course and data review: BP 160/78 with otherwise normal vitals Labs: Hemoglobin 11.5, creatinine 2.96 up from baseline of 2.03.  CBC and CMP otherwise unremarkable.  Troponin 17 EKG, personally viewed and interpreted: Ectopic atrial rhythm at 69 no acute ST-T wave changes Imaging: CT head and C-spine nonacute Chest x-ray nonacute Acute x-ray right hip showing displaced right femoral neck fracture  The ED provider spoke with orthopedist, [name] who will take patient to the OR on 06/10/2022.  Patient was given an IV fluid bolus, IV morphine, given a dose of Ancef.  Hospitalist consulted for admission.     Past Medical History:  Diagnosis Date   AAA (abdominal aortic aneurysm) ([place])    a.) s/p EVAR 01/05/2017. b.) native aneurysm sac 6.6 x 6.9 cm by CT on 03/31/2021   Abnormality of tongue     a.) CT head/neck 07/10/2021 --> asymmetric soft tissue at the RIGHT tongue base with a superficial 8 mm lesion.   Anemia    Aneurysm of right common iliac artery (HCC)    a.) measured 2.7 cm by CT on 03/31/2021   Anxiety    Aortic atherosclerosis (HCC)    Atrophic kidney    B12 deficiency    CAD (coronary artery disease)    Cervical radiculopathy    Chronic airway obstruction (HCC)    Chronic kidney disease (CKD), stage III (moderate) (HCC)    Chronic pain syndrome 06/16/2021   Chronic right shoulder pain 11/12/2019   Chronic tension headaches    Chronic, continuous use of opioids 06/16/2021   Coronary artery disease    DDD (degenerative disc disease), lumbar    Degenerative disc disease, lumbar    with lumbar radiculopathy   Elbow fracture, left    GERD (gastroesophageal reflux disease)    H/O adenomatous polyp of colon    H/O hemorrhoids    HTN (hypertension)    Hyperlipidemia    Meralgia paresthetica    Mild cognitive impairment    Nephrolithiasis    Neuralgia    Numbness of right foot 02/19/2021   Osteoarthritis    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   Presence of permanent cardiac pacemaker    Prostate cancer ([place])    a.) s/p prostatectomy   S/P CABG x 3 05/01/2004   a.) LVEF 40-49%; LIMA-LAD, SVG-OM1, SVG-PDA   Second  degree AV block    Sinoatrial node dysfunction (HCC)    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   Past Surgical History:  Procedure Laterality Date   CATARACT EXTRACTION Bilateral    COLONOSCOPY     COLONOSCOPY     COLONOSCOPY WITH PROPOFOL N/A 10/20/2015   Procedure: COLONOSCOPY WITH PROPOFOL;  Surgeon: Manya Silvas, MD;   Location: [facility] ENDOSCOPY;  Service: Endoscopy;  Laterality: N/A;   COLONOSCOPY WITH PROPOFOL N/A 11/28/2020   Procedure: COLONOSCOPY WITH PROPOFOL;  Surgeon: Robert Bellow, MD;  Location: [facility] ENDOSCOPY;  Service: Endoscopy;  Laterality: N/A;   CORONARY ARTERY BYPASS GRAFT N/A 05/01/2004   Procedure: 3v CABG (LIMA-LAD, SVG-OM1, SVG-PDA); Location: [facility]; Surgeon: Ander Gaster, MD   EMBOLIZATION Right 12/27/2016   Procedure: Embolization;  Surgeon: Algernon Huxley, MD;  Location: [place] CV LAB;  Service: Cardiovascular;  Laterality: Right;   ENDOVASCULAR REPAIR/STENT GRAFT N/A 01/05/2017   Procedure: Endovascular Repair/Stent Graft;  Surgeon: Algernon Huxley, MD;  Location: [place] CV LAB;  Service: Cardiovascular;  Laterality: N/A;   INSERT / REPLACE / REMOVE PACEMAKER     JOINT REPLACEMENT     shoulder and knees   KNEE ARTHROSCOPY     LUNG LOBECTOMY Right 01/31/2013   Procedure: RIGHT PULMONARY LOBECTOMY; Location: [place]; Surgeon: Nestor Lewandowsky, MD   MICROLARYNGOSCOPY Right 08/10/2021   Procedure: MICRODIRECT LARYNGOSCOPY WITH BIOPSY OF TONGUE BASE;  Surgeon: Beverly Gust, MD;  Location: [facility] ORS;  Service: ENT;  Laterality: Right;   PACEMAKER INSERTION  12/2012   Dual chanber pacemaker generator   POLYPECTOMY     POSTERIOR LUMBAR FUSION     Procedure: POSTERIOR LUMBAR INTERBODY FUSION, INTERBODY PROSTHESIS, POSTERIOR LATERAL ARTHRODESIS, POSTERIOR NON-SEGMENTAL INSTRUMENTATION LUMBAR FIVE- SACRAL ONE; Location[facility]; Surgeon: Newman Pies, MD   PROSTATECTOMY     TONGUE BIOPSY N/A 08/10/2021   Procedure: TONGUE BIOPSY;  Surgeon: Beverly Gust, MD;  Location: [facility] ORS;  Service: ENT;  Laterality: N/A;   TOTAL KNEE ARTHROPLASTY Bilateral    TOTAL SHOULDER ARTHROPLASTY Left 07/10/2015   Procedure: TOTAL SHOULDER ARTHROPLASTY;  Surgeon: [name], MD;  Location: [facility] ORS;  Service: Orthopedics;  Laterality: Left;   TOTAL SHOULDER REPLACEMENT     Social  History:  reports that he quit smoking about 18 years ago. His smoking use included cigarettes. He has a 67.50 pack-year smoking history. He has never used smokeless tobacco. He reports that he does not drink alcohol and does not use drugs.  No Known Allergies  Family History  Problem Relation Age of Onset   Heart attack Mother    Heart attack Father    Breast cancer Sister    Asthma Sister     Prior to Admission medications   Medication Sig Start Date End Date Taking? Authorizing Provider  acetaminophen (TYLENOL) 500 MG tablet Take 500 mg by mouth every 4 (four) hours as needed for moderate pain.    [provider]  ascorbic acid (VITAMIN C) 1000 MG tablet Take 1,000 mg by mouth daily.    [provider]  aspirin EC 81 MG tablet Take 81 mg by mouth  daily.     [provider]  atorvastatin (LIPITOR) 20 MG tablet Take 40 mg by mouth at bedtime.    [provider]  calcitRIOL (ROCALTROL) 0.25 MCG capsule Take 0.25 mcg by mouth daily. 05/12/22   [provider]  Calcium Carb-Cholecalciferol (CALCIUM 600 + D PO) Take 1 tablet by mouth daily.    [provider]  clopidogrel (PLAVIX) 75 MG tablet Take 75 mg by mouth daily.  10/30/14   [provider]  docusate sodium (COLACE) 100 MG capsule Take 300 mg by mouth at bedtime.    [provider]  donepezil (ARICEPT) 10 MG tablet Take 10 mg by mouth at bedtime. 05/12/22   [provider]  ezetimibe (ZETIA) 10 MG tablet Take 10 mg by mouth daily. 10/17/18 08/10/21  [provider]  Ferrous Sulfate (IRON SLOW RELEASE) 140 (45 Fe) MG TBCR Take 1 tablet by mouth 2 (two) times daily.    [provider]  ipratropium (ATROVENT) 0.06 % nasal spray SMARTSIG:2-4 Puff(s) Both Nares Every 8 Hours 05/17/22   [provider]  loratadine (CLARITIN) 10 MG tablet Take 10 mg by mouth daily. 05/15/22   [provider]  losartan (COZAAR) 100 MG tablet Take 100 mg  by mouth daily.    [provider]  methocarbamol (ROBAXIN) 500 MG tablet Take by mouth. 01/28/20   [provider]  metoprolol succinate (TOPROL-XL) 25 MG 24 hr tablet Take 25 mg by mouth daily. 05/24/17   [provider]  Multiple Vitamin (MULTIVITAMIN WITH MINERALS) TABS tablet Take 1 tablet by mouth daily.     [provider]  Multiple Vitamins-Minerals (PRESERVISION AREDS 2) CAPS Take 1 tablet by mouth 2 (two) times daily.    [provider]  pantoprazole (PROTONIX) 40 MG tablet Take 40 mg by mouth daily.    [provider]  Probiotic Product (PROBIOTIC PO) Take 1 capsule by mouth daily.    [provider]  traMADol (ULTRAM) 50 MG tablet Take by mouth.    [provider]  vitamin B-12 (CYANOCOBALAMIN) 1000 MCG tablet Take 1,000 mcg by mouth daily.    [provider]  zolpidem (AMBIEN) 10 MG tablet Take 10 mg by mouth at bedtime. 10/30/14   [provider]    Physical Exam: Vitals:   06/10/22 0000 06/10/22 0002 06/10/22 0136 06/10/22 0227  BP:  (!) 160/78 (!) 156/83 (!) 162/81  Pulse:  69 66 77  Resp:  20 15 20   Temp:  98.3 F (36.8 C)  98.5 F (36.9 C)  TempSrc:  Oral    SpO2:  95% 99% 92%  Weight: 95.2 kg     Height: 5\' 11"  (1.803 m)      Physical Exam Vitals and nursing note reviewed.  Constitutional:      General: He is not in acute distress. HENT:     Head: Normocephalic and atraumatic.  Cardiovascular:     Rate and Rhythm: Normal rate and regular rhythm.     Heart sounds: Normal heart sounds.  Pulmonary:     Effort: Pulmonary effort is normal.     Breath sounds: Normal breath sounds.  Abdominal:     Palpations: Abdomen is soft.     Tenderness: There is no abdominal tenderness.  Neurological:     Mental Status: Mental status is at baseline.     Labs on Admission: I have personally reviewed following labs and imaging studies  CBC: Recent Labs  Lab 06/10/22 0001  WBC 6.6   NEUTROABS 5.0  HGB 11.5*  HCT 36.9*  MCV 92.7  PLT 607   Basic Metabolic Panel: Recent Labs  Lab 06/10/22 0001  NA 142  K 4.7  CL 109  CO2 25  GLUCOSE 123*  BUN 32*  CREATININE 2.96*  CALCIUM 9.3   GFR: Estimated Creatinine Clearance: 23.1 mL/min (A) (by C-G formula based on SCr of 2.96 mg/dL (H)). Liver Function Tests: Recent Labs  Lab 06/10/22 0001  AST 19  ALT 20  ALKPHOS 68  BILITOT 0.4  PROT 6.4*  ALBUMIN 3.7   No results for input(s): "LIPASE", "AMYLASE" in the last 168 hours. No results for input(s): "AMMONIA" in the last 168 hours. Coagulation Profile: Recent Labs  Lab 06/10/22 0001  INR 1.0   Cardiac Enzymes: No results for input(s): "CKTOTAL", "CKMB", "CKMBINDEX", "TROPONINI" in the last 168 hours. BNP (last 3 results) No results for input(s): "PROBNP" in the last 8760 hours. HbA1C: No results for input(s): "HGBA1C" in the last 72 hours. CBG: No results for input(s): "GLUCAP" in the last 168 hours. Lipid Profile: No results for input(s): "CHOL", "HDL", "LDLCALC", "TRIG", "CHOLHDL", "LDLDIRECT" in the last 72 hours. Thyroid Function Tests: No results for input(s): "TSH", "T4TOTAL", "FREET4", "T3FREE", "THYROIDAB" in the last 72 hours. Anemia Panel: No results for input(s): "VITAMINB12", "FOLATE", "FERRITIN", "TIBC", "IRON", "RETICCTPCT" in the last 72 hours. Urine analysis:    Component Value Date/Time   COLORURINE YELLOW (A) 06/06/2017 2254   APPEARANCEUR Cloudy (A) 03/09/2021 1332   LABSPEC 1.016 06/06/2017 2254   LABSPEC 1.015 10/25/2014 1330   PHURINE 5.0 06/06/2017 2254   GLUCOSEU Negative 03/09/2021 1332   GLUCOSEU Negative 10/25/2014 1330   HGBUR NEGATIVE 06/06/2017 2254   BILIRUBINUR Negative 03/09/2021 1332   BILIRUBINUR Negative 10/25/2014 1330   KETONESUR NEGATIVE 06/06/2017 2254   PROTEINUR 2+ (A) 03/09/2021 1332   PROTEINUR NEGATIVE 06/06/2017 2254   NITRITE Negative 03/09/2021 1332   NITRITE NEGATIVE 06/06/2017 2254    LEUKOCYTESUR 1+ (A) 03/09/2021 1332   LEUKOCYTESUR Negative 10/25/2014 1330    Radiological Exams on Admission: DG Chest 1 View  Result Date: 06/10/2022 CLINICAL DATA:  Fall with right hip fracture. EXAM: CHEST  1 VIEW COMPARISON:  CT 11/04/2020, radiograph 03/26/2017 FINDINGS: Prior median sternotomy. Left-sided pacemaker in place. Mild cardiomegaly. Stable mediastinal contours. Scarring at the right lung base. No acute consolidation. No pleural fluid or pneumothorax. No pulmonary edema. Bilateral proximal humeral arthroplasties. IMPRESSION: No acute abnormality. Electronically Signed   By: Keith Rake M.D.   On: 06/10/2022 00:58   DG Hip Unilat  With Pelvis 2-3 Views Right  Result Date: 06/10/2022 CLINICAL DATA:  Fall with right hip pain. EXAM: DG HIP (WITH OR WITHOUT PELVIS) 2-3V RIGHT COMPARISON:  None Available. FINDINGS: Displaced right femoral neck fracture with mild comminution. There is proximal migration of the femoral shaft. Femoral head remains seated. Pubic rami and remainder of the bony pelvis are intact. Multiple pelvic phleboliths. Iliac stents are partially included. Lumbosacral hardware. IMPRESSION: Displaced right femoral neck fracture. Electronically Signed   By: Keith Rake M.D.   On: 06/10/2022 00:56   CT Cervical Spine Wo Contrast  Result Date: 06/10/2022 CLINICAL DATA:  Neck trauma (Age >= 65y) Ground level fall. EXAM: CT CERVICAL SPINE WITHOUT CONTRAST TECHNIQUE: Multidetector CT imaging of the cervical spine was performed without intravenous contrast. Multiplanar CT image reconstructions were also generated. RADIATION DOSE REDUCTION: This exam was performed according to the departmental dose-optimization program which includes automated exposure  control, adjustment of the mA and/or kV according to patient size and/or use of iterative reconstruction technique. COMPARISON:  Soft tissue neck CT 07/10/2021 FINDINGS: Alignment: Straightening of normal lordosis. No traumatic  subluxation. Skull base and vertebrae: No acute fracture. Vertebral body heights are maintained. The dens and skull base are intact. Soft tissues and spinal canal: No prevertebral fluid or swelling. No visible canal hematoma. Please note patient's known base of tongue lesion is not well assessed on the current exam. 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 Upper chest: Apical emphysema.  No acute findings Other: None. IMPRESSION: 1. No acute fracture or traumatic subluxation of the cervical spine. 2. 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 Electronically Signed   By: Keith Rake M.D.   On: 06/10/2022 00:44   CT HEAD WO CONTRAST (5MM)  Result Date: 06/10/2022 CLINICAL DATA:  Head trauma, moderate-severe Ground level fall.  Head CT 03/04/2021 EXAM: CT HEAD WITHOUT CONTRAST TECHNIQUE: Contiguous axial images were obtained from the base of the skull through the vertex without intravenous contrast. RADIATION DOSE REDUCTION: This exam was performed according to the departmental dose-optimization program which includes automated exposure control, adjustment of the mA and/or kV according to patient size and/or use of iterative reconstruction technique. COMPARISON:  Head CT 03/04/2021 FINDINGS: Brain: No acute intracranial hemorrhage. Stable degree of generalized atrophy with prominent periventricular chronic small vessel ischemia. Chronic inferior right frontal encephalomalacia. Remote right cerebellar infarct. Remote lacunar infarct in the left thalamus. No subdural or extra-axial collection. No midline shift or mass effect. Vascular: Atherosclerosis of skullbase vasculature without hyperdense vessel or abnormal calcification. Skull: No fracture or focal lesion. Sinuses/Orbits: Mucosal thickening of the left frontal sinus and scattered throughout ethmoid air cells. No mastoid effusion. Bilateral cataract resection. Other: Small left parietooccipital scalp hematoma. IMPRESSION: 1. Small left  parietooccipital scalp hematoma. No acute intracranial abnormality. No skull fracture. 2. Stable atrophy, chronic small vessel ischemia, and remote infarcts. Electronically Signed   By: Keith Rake M.D.   On: 06/10/2022 00:37     Data Reviewed: Relevant notes from primary care and specialist visits, past discharge summaries as available in EHR, including Care Everywhere. Prior diagnostic testing as pertinent to current admission diagnoses Updated medications and problem lists for reconciliation ED course, including vitals, labs, imaging, treatment and response to treatment Triage notes, nursing and pharmacy notes and ED provider's notes Notable results as noted in HPI   Assessment and Plan: * Fracture of femoral neck, right, closed ([place]) Accidental fall Preoperative clearance Patient has Multiple Chronic Stable Medical Conditions moderate risk for perioperative cardiopulmonary complications Nephrology consulted for any recommendations to optimize renal function preoperatively IV hydration and maintain normotension Benefits of surgery to maintain functional capacity outweigh risks We will keep n.p.o.  Hold Plavix and aspirin Pain control Orthopedic [name] to take patient to the OR later on today.   Acute renal failure superimposed on stage 4 chronic kidney disease ([place]) S/p IV fluid bolus in the ED.  Low-dose maintenance fluids Continue to monitor renal function and avoid nephrotoxins Check renal function in the a.m.  Nephrology consulted  Mild cognitive impairment Continue Aricept Delirium precautions  CAD S/P CABG x 3 Patient denies chest pain or shortness of breath.  Has good functional capacity Last seen by his cardiologist in March 2023 Currently on aspirin and atorvastatin and metoprolol   Presence of permanent cardiac pacemaker Gets scheduled pacemaker checks.  No acute issues suspected  Primary squamous cell carcinoma of base  of tongue [facility]) Completed XRT  in February 2023  Thoracic aortic aneurysm without rupture [facility]) AAA s/p endovascular repair Followed by vascular surgeon, Dr. Lucky Cowboy On Plavix, aspirin and atorvastatin Patient denies abdominal pain or chest pain Holding Plavix and aspirin for surgery  Essential hypertension Blood pressure under fair control Continue losartan and metoprolol  Cancer of lung ([place]) S/p lobectomy.  Follows with oncology        DVT prophylaxis: SCD  Consults: Orthopedics, [name]  Advance Care Planning:   Code Status: Full Code   Family Communication: none  Disposition Plan: Back to previous home environment  Severity of Illness: The appropriate patient status for this patient is INPATIENT. Inpatient status is judged to be reasonable and necessary in order to provide the required intensity of service to ensure the patient's safety. The patient's presenting symptoms, physical exam findings, and initial radiographic and laboratory data in the context of their chronic comorbidities is felt to place them at high risk for further clinical deterioration. Furthermore, it is not anticipated that the patient will be medically stable for discharge from the hospital within 2 midnights of admission.   * I certify that at the point of admission it is my clinical judgment that the patient will require inpatient hospital care spanning beyond 2 midnights from the point of admission due to high intensity of service, high risk for further deterioration and high frequency of surveillance required.*  Author: Athena Masse, MD 06/10/2022 2:38 AM  For on call review [email or web address].

## 2022-06-10 NOTE — Op Note (Signed)
06/10/2022  4:33 PM  Patient:   Rick Mcbride.  Pre-Op Diagnosis:   Displaced femoral neck fracture, right hip.  Post-Op Diagnosis:   Same.  Procedure:   Right hip bipolar hemiarthroplasty.  Surgeon:   Pascal Lux, MD  Assistant:   Cameron Proud, PA-C; Almon Register, PA-S  Anesthesia:   GET  Findings:   As above.  Complications:   None  EBL:   150 cc  Fluids:   600 cc crystalloid  UOP:   None  TT:   None  Drains:   None  Closure:   Staples  Implants:   Biomet press-fit system with a #15 lateral offset reduced proximal profile Echo femoral stem, a 51 mm outer diameter shell, and a 28 mm head with a -3 mm neck .  Brief Clinical Note:   The patient is an 81 year old male who sustained the above-noted injury last evening when he fell at home moving from his bedroom into his living room. He was brought to the emergency room where x-rays demonstrated the above-noted injury. The patient has been cleared medically and presents at this time for definitive management of the injury.  Procedure:   The patient was brought into the operating room and lain in the supine position. After adequate general endotracheal intubation and anesthesia was obtained, the patient was repositioned in the left lateral decubitus position and secured using a lateral hip positioner. The right hip and lower extremity were prepped with ChloroPrep solution before being draped sterilely. Preoperative antibiotics were administered. A timeout was performed to verify the appropriate surgical site.    A standard posterior approach to the hip was made through an approximately 4-5 inch incision. The incision was carried down through the subcutaneous tissues to expose the gluteal fascia and proximal end of the iliotibial band. These structures were split the length of the incision and the Charnley self-retaining hip retractor placed. The bursal tissues were swept posteriorly to expose the short external rotators.  The anterior border of the piriformis tendon was identified and this plane developed down through the capsule to enter the joint. Abundant fracture hematoma was suctioned. A flap of tissue was elevated off the posterior aspect of the femoral neck and greater trochanter and retracted posteriorly. This flap included the piriformis tendon, the short external rotators, and the posterior capsule. The femoral head was removed in its entirety, then taken to the back table where it was measured and found to be optimally replicated by a 51 mm head. The appropriate trial head was inserted and found to demonstrate an excellent suction fit.   Attention was directed to the femoral side. The femoral neck was recut 10-12 mm above the lesser trochanter using an oscillating saw. The piriformis fossa was debrided of soft tissues before the intramedullary canal was accessed through this point using a triple step reamer. The canal was reamed sequentially beginning with a #7 tapered reamer and progressing to a #15 tapered reamer. This provided excellent circumferential chatter. A box osteotome was used to establish version before the canal was broached sequentially beginning with a #12 broach and progressing to a #15 broach. This was left in place and several trial reductions performed. The permanent #15 reduced proximal profile femoral stem was impacted into place. A repeat trial reduction was performed using the -3 mm neck length. The -3 mm neck length demonstrated excellent stability both in extension and external rotation as well as with flexion to 90 and internal rotation beyond 70. It  also was stable in the position of sleep. The 51 mm outer diameter shell with the 28 mm head with the -3 mm neck adapter construct was put together on the back table before being impacted onto the stem of the femoral component. The Morse taper locking mechanism was verified using manual distraction before the head was relocated and the hip placed  through a range of motion with the findings as described above.  The wound was copiously irrigated with sterile saline solution via the jet lavage system before the peri-incisional and pericapsular tissues were injected with 30 cc of 0.5% Sensorcaine with epinephrine and 20 cc of Exparel diluted out to 60 cc with normal saline to help with postoperative analgesia. The posterior flap was reapproximated to the posterior aspect of the greater trochanter using #2 Tycron interrupted sutures placed through drill holes. The iliotibial band was reapproximated using #1 Vicryl interrupted sutures before the gluteal fascia was closed using a running #1 Vicryl suture. At this point, 1 g of transexemic acid in 10 cc of normal saline was injected into the joint to help reduce postoperative bleeding. The subcutaneous tissues were closed in several layers using 2-0 Vicryl interrupted sutures before the skin was closed using staples. A sterile occlusive dressing was applied to the wound . The patient then was rolled back into the supine position on the hospital bed before being awakened, extubated, and returned to the recovery room in satisfactory condition after tolerating the procedure well.

## 2022-06-10 NOTE — Consult Note (Signed)
ORTHOPAEDIC CONSULTATION  REQUESTING PHYSICIAN: Rick Brod, MD  Chief Complaint:   Right hip pain  History of Present Illness: Rick Mcbride. is a 81 y.o. 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  The patient was in his usual state of health last evening when he apparently went to bed, then got back up to move to the recliner in the living room due to back pain.  Apparently, while moving back to the living room, he lost his balance and fell, injuring his right hip.  He was brought to the emergency room where x-rays demonstrated a right femoral neck fracture.  The patient notes that he did bump his head, but did not lose consciousness.  He also denies any lightheadedness, dizziness, chest pain, shortness of breath, or other symptoms which may have precipitated his fall.  A CT scan in the emergency room showed no acute intracranial pathology.  Past Medical History:  Diagnosis Date   AAA (abdominal aortic aneurysm) ([place])    a.) s/p EVAR 01/05/2017. b.) native aneurysm sac 6.6 x 6.9 cm by CT on 03/31/2021   Abnormality of tongue    a.) CT head/neck 07/10/2021 --> asymmetric soft tissue at the RIGHT tongue base with a superficial 8 mm lesion.   Anemia    Aneurysm of right common iliac artery (HCC)    a.) measured 2.7 cm by CT on 03/31/2021   Anxiety    Aortic atherosclerosis (HCC)    Atrophic kidney    B12 deficiency    CAD (coronary artery disease)    Cervical radiculopathy    Chronic airway obstruction (HCC)    Chronic kidney disease (CKD), stage III (moderate) (HCC)    Chronic pain syndrome 06/16/2021   Chronic right shoulder pain  11/12/2019   Chronic tension headaches    Chronic, continuous use of opioids 06/16/2021   Coronary artery disease    DDD (degenerative disc disease), lumbar    Degenerative disc disease, lumbar    with lumbar radiculopathy   Elbow fracture, left    GERD (gastroesophageal reflux disease)    H/O adenomatous polyp of colon    H/O hemorrhoids    HTN (hypertension)    Hyperlipidemia    Meralgia paresthetica    Mild cognitive impairment    Nephrolithiasis    Neuralgia    Numbness of right foot 02/19/2021   Osteoarthritis    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   Presence of permanent cardiac pacemaker    Prostate cancer [facility])    a.) s/p prostatectomy   S/P CABG x 3 05/01/2004   a.) LVEF 40-49%; LIMA-LAD, SVG-OM1, SVG-PDA   Second degree AV block    Sinoatrial node dysfunction (HCC)    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   Past Surgical History:  Procedure Laterality Date   CATARACT EXTRACTION Bilateral    COLONOSCOPY     COLONOSCOPY  COLONOSCOPY WITH PROPOFOL N/A 10/20/2015   Procedure: COLONOSCOPY WITH PROPOFOL;  Surgeon: Manya Silvas, MD;  Location: [facility] ENDOSCOPY;  Service: Endoscopy;  Laterality: N/A;   COLONOSCOPY WITH PROPOFOL N/A 11/28/2020   Procedure: COLONOSCOPY WITH PROPOFOL;  Surgeon: Robert Bellow, MD;  Location: [facility] ENDOSCOPY;  Service: Endoscopy;  Laterality: N/A;   CORONARY ARTERY BYPASS GRAFT N/A 05/01/2004   Procedure: 3v CABG (LIMA-LAD, SVG-OM1, SVG-PDA); Location: [facility]; Surgeon: Ander Gaster, MD   EMBOLIZATION Right 12/27/2016   Procedure: Embolization;  Surgeon: Algernon Huxley, MD;  Location: [place] CV LAB;  Service:  Cardiovascular;  Laterality: Right;   ENDOVASCULAR REPAIR/STENT GRAFT N/A 01/05/2017   Procedure: Endovascular Repair/Stent Graft;  Surgeon: Algernon Huxley, MD;  Location: [place] CV LAB;  Service: Cardiovascular;  Laterality: N/A;   INSERT / REPLACE / REMOVE PACEMAKER     JOINT REPLACEMENT     shoulder and knees   KNEE ARTHROSCOPY     LUNG LOBECTOMY Right 01/31/2013   Procedure: RIGHT PULMONARY LOBECTOMY; Location: [place]; Surgeon: Nestor Lewandowsky, MD   MICROLARYNGOSCOPY Right 08/10/2021   Procedure: MICRODIRECT LARYNGOSCOPY WITH BIOPSY OF TONGUE BASE;  Surgeon: Beverly Gust, MD;  Location: [facility] ORS;  Service: ENT;  Laterality: Right;   PACEMAKER INSERTION  12/2012   Dual chanber pacemaker generator   POLYPECTOMY     POSTERIOR LUMBAR FUSION     Procedure: POSTERIOR LUMBAR INTERBODY FUSION, INTERBODY PROSTHESIS, POSTERIOR LATERAL ARTHRODESIS, POSTERIOR NON-SEGMENTAL INSTRUMENTATION LUMBAR FIVE- SACRAL ONE; Location: [facility]; Surgeon: Newman Pies, MD   PROSTATECTOMY     TONGUE BIOPSY N/A 08/10/2021   Procedure: TONGUE BIOPSY;  Surgeon: Beverly Gust, MD;  Location: [facility] ORS;  Service: ENT;  Laterality: N/A;   TOTAL KNEE ARTHROPLASTY Bilateral    TOTAL SHOULDER ARTHROPLASTY Left 07/10/2015   Procedure: TOTAL SHOULDER ARTHROPLASTY;  Surgeon: [name], MD;  Location: [facility] ORS;  Service: Orthopedics;  Laterality: Left;   TOTAL SHOULDER REPLACEMENT     Social History   Socioeconomic History   Marital status: Married    Spouse name: Not on file   Number of children: 2   Years of education: 16   Highest education level: Not on file  Occupational History   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   Patient is right handed.      Live at home with wife   Social Determinants of Health   Financial Resource Strain: Not on file  Food Insecurity: Not on file  Transportation Needs: Not on file  Physical Activity: Not on file  Stress: Not on file  Social Connections: Not on file   Family History  Problem Relation Age of Onset   Heart attack Mother    Heart attack Father    Breast cancer Sister    Asthma Sister    No Known Allergies Prior to Admission medications   Medication Sig  Start Date End Date Taking? Authorizing Provider  acetaminophen (TYLENOL) 500 MG tablet Take 500 mg by mouth every 4 (four) hours as needed for moderate pain.   Yes [provider]  aspirin EC 81 MG tablet Take 81 mg by mouth daily.    Yes [provider]  atorvastatin (LIPITOR) 20 MG tablet Take 40 mg by mouth at bedtime.   Yes [provider]  calcitRIOL (ROCALTROL) 0.25 MCG capsule Take 0.25 mcg by mouth daily. 05/12/22  Yes [provider]  Calcium Carb-Cholecalciferol (CALCIUM 600 + D PO) Take 1 tablet by mouth daily.   Yes [provider]  cetirizine (ZYRTEC) 10 MG tablet Take 10 mg by mouth at bedtime.   Yes [provider]  clopidogrel (PLAVIX) 75 MG tablet Take 75 mg by mouth daily.  10/30/14  Yes [provider]  docusate sodium (COLACE) 100 MG capsule Take 300 mg by mouth at bedtime.   Yes [provider]  donepezil (ARICEPT) 10 MG tablet Take 10 mg by mouth at bedtime. 05/12/22  Yes [provider]  ezetimibe (ZETIA) 10 MG tablet Take 10 mg by mouth daily. 10/17/18 06/10/22 Yes [provider]  Ferrous Sulfate (IRON SLOW RELEASE) 140 (45 Fe) MG TBCR Take 1 tablet by mouth 2 (two) times daily.   Yes [provider]   loratadine (CLARITIN) 10 MG tablet Take 10 mg by mouth daily. 05/15/22  Yes [provider]  losartan (COZAAR) 100 MG tablet Take 100 mg by mouth daily.   Yes [provider]  melatonin 5 MG TABS Take 10 mg by mouth at bedtime.   Yes [provider]  methocarbamol (ROBAXIN) 500 MG tablet Take 500 mg by mouth at bedtime. 01/28/20  Yes [provider]  metoprolol succinate (TOPROL-XL) 25 MG 24 hr tablet Take 25 mg by mouth daily. 05/24/17  Yes [provider]  Multiple Vitamin (MULTIVITAMIN WITH MINERALS) TABS tablet Take 1 tablet by mouth daily.    Yes [provider]  Multiple Vitamins-Minerals (PRESERVISION AREDS 2) CAPS Take 1 tablet by mouth 2 (two) times daily.   Yes [provider]  pantoprazole (PROTONIX) 40 MG tablet Take 40 mg by mouth daily.   Yes [provider]  Probiotic Product (PROBIOTIC PO) Take 1 capsule by mouth daily.   Yes [provider]  traMADol (ULTRAM) 50 MG tablet Take 50 mg by mouth every 6 (six) hours as needed.   Yes [provider]  zolpidem (AMBIEN) 10 MG tablet Take 10 mg by mouth at bedtime. 10/30/14  Yes [provider]  ascorbic acid (VITAMIN C) 1000 MG tablet Take 1,000 mg by mouth daily. Patient not taking: Reported on 06/10/2022    [provider]  ipratropium (ATROVENT) 0.06 % nasal spray SMARTSIG:2-4 Puff(s) Both Nares Every 8 Hours Patient not taking: Reported on 06/10/2022 05/17/22   [provider]  vitamin B-12 (CYANOCOBALAMIN) 1000 MCG tablet Take 1,000 mcg by mouth daily. Patient not taking: Reported on 06/10/2022    [provider]   DG Chest 1 View  Result Date: 06/10/2022 CLINICAL DATA:  Fall with right hip fracture. EXAM: CHEST  1 VIEW COMPARISON:  CT 11/04/2020, radiograph 03/26/2017 FINDINGS: Prior median sternotomy. Left-sided pacemaker in place. Mild cardiomegaly. Stable mediastinal contours. Scarring at the right lung base. No  acute consolidation. No pleural fluid or pneumothorax. No pulmonary edema. Bilateral proximal humeral arthroplasties. IMPRESSION: No acute abnormality. Electronically Signed   By: Aurther Loft.D.  On: 06/10/2022 00:58   DG Hip Unilat  With Pelvis 2-3 Views Right  Result Date: 06/10/2022 CLINICAL DATA:  Fall with right hip pain. EXAM: DG HIP (WITH OR WITHOUT PELVIS) 2-3V RIGHT COMPARISON:  None Available. FINDINGS: Displaced right femoral neck fracture with mild comminution. There is proximal migration of the femoral shaft. Femoral head remains seated. Pubic rami and remainder of the bony pelvis are intact. Multiple pelvic phleboliths. Iliac stents are partially included. Lumbosacral hardware. IMPRESSION: Displaced right femoral neck fracture. Electronically Signed   By: Keith Rake M.D.   On: 06/10/2022 00:56   CT Cervical Spine Wo Contrast  Result Date: 06/10/2022 CLINICAL DATA:  Neck trauma (Age >= 65y) Ground level fall. EXAM: CT CERVICAL SPINE WITHOUT CONTRAST TECHNIQUE: Multidetector CT imaging of the cervical spine was performed without intravenous contrast. Multiplanar CT image reconstructions were also generated. RADIATION DOSE REDUCTION: This exam was performed according to the departmental dose-optimization program which includes automated exposure control, adjustment of the mA and/or kV according to patient size and/or use of iterative reconstruction technique. COMPARISON:  Soft tissue neck CT 07/10/2021 FINDINGS: Alignment: Straightening of normal lordosis. No traumatic subluxation. Skull base and vertebrae: No acute fracture. Vertebral body heights are maintained. The dens and skull base are intact. Soft tissues and spinal canal: No prevertebral fluid or swelling. No visible canal hematoma. Please note patient's known base of tongue lesion is not well assessed on the current exam. 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 Upper chest: Apical emphysema.  No acute  findings Other: None. IMPRESSION: 1. No acute fracture or traumatic subluxation of the cervical spine. 2. 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 Electronically Signed   By: Keith Rake M.D.   On: 06/10/2022 00:44   CT HEAD WO CONTRAST (5MM)  Result Date: 06/10/2022 CLINICAL DATA:  Head trauma, moderate-severe Ground level fall.  Head CT 03/04/2021 EXAM: CT HEAD WITHOUT CONTRAST TECHNIQUE: Contiguous axial images were obtained from the base of the skull through the vertex without intravenous contrast. RADIATION DOSE REDUCTION: This exam was performed according to the departmental dose-optimization program which includes automated exposure control, adjustment of the mA and/or kV according to patient size and/or use of iterative reconstruction technique. COMPARISON:  Head CT 03/04/2021 FINDINGS: Brain: No acute intracranial hemorrhage. Stable degree of generalized atrophy with prominent periventricular chronic small vessel ischemia. Chronic inferior right frontal encephalomalacia. Remote right cerebellar infarct. Remote lacunar infarct in the left thalamus. No subdural or extra-axial collection. No midline shift or mass effect. Vascular: Atherosclerosis of skullbase vasculature without hyperdense vessel or abnormal calcification. Skull: No fracture or focal lesion. Sinuses/Orbits: Mucosal thickening of the left frontal sinus and scattered throughout ethmoid air cells. No mastoid effusion. Bilateral cataract resection. Other: Small left parietooccipital scalp hematoma. IMPRESSION: 1. Small left parietooccipital scalp hematoma. No acute intracranial abnormality. No skull fracture. 2. Stable atrophy, chronic small vessel ischemia, and remote infarcts. Electronically Signed   By: Keith Rake M.D.   On: 06/10/2022 00:37    Positive ROS: All other systems have been reviewed and were otherwise negative with the exception of those mentioned in the HPI and as above.  Physical Exam: General:  Alert, no  acute distress Psychiatric:  Patient is questionably competent for consent, but exhibits normal mood and affect   Cardiovascular:  No pedal edema Respiratory:  No wheezing, non-labored breathing GI:  Abdomen is soft and non-tender Skin:  No lesions in the area of chief complaint Neurologic:  Sensation intact distally  Lymphatic:  No axillary or cervical lymphadenopathy  Orthopedic Exam:  Orthopedic examination is limited to the right hip and lower extremity.  The right lower extremity is somewhat shortened and externally rotated as compared to the left.  Skin inspection around the right hip is unremarkable.  No swelling, erythema, ecchymosis, abrasions, or other skin abnormalities are identified.  He has only mild tenderness to palpation over the anterolateral aspect of the hip.  He has more severe pain with any attempted active or passive motion of the hip.  He is grossly neurovascularly intact to the right lower extremity and foot.  X-rays:  Recent x-rays of the pelvis and right hip are available for review.  These films demonstrate a varus angulated displaced right femoral neck fracture.  No significant degenerative changes of the hip joint are noted.  No lytic lesions or other acute bony abnormalities are identified.  Assessment: Displaced right femoral neck fracture.  Plan: The treatment options, including both surgical and nonsurgical choices, have been discussed in detail with the patient and his wife who is at the bedside.  The risks (including bleeding, infection, nerve and/or blood vessel injury, persistent or recurrent pain, loosening or failure of the components, leg length inequality, dislocation, need for further surgery, blood clots, strokes, heart attacks or arrhythmias, pneumonia, etc.) and benefits of the surgical procedure were discussed.  The patient and his wife state their understanding and agree to proceed.  A formal written consent will be obtained by the nursing  staff.  Thank you for asking me to participate in the care of this most delightful yet unfortunate man.  I will be happy to follow him with you.   Pascal Lux, MD  Beeper #:  [phone]  06/10/2022 10:38 AM

## 2022-06-10 NOTE — Assessment & Plan Note (Addendum)
Status post hip repair.  Eliquis for DVT prophylaxis.  (Will stop Plavix while he is on Eliquis) follow-up with orthopedic surgery in 2 weeks.  For skilled nursing.

## 2022-06-10 NOTE — Assessment & Plan Note (Addendum)
Blood pressure has been trending upward, secondary to volume overload.  Improved significantly and now stable following Lasix

## 2022-06-10 NOTE — ED Provider Notes (Addendum)
[facility] Provider Note    Event Date/Time   First MD Initiated Contact with Patient 06/10/22 0000     (approximate)   History   Fall   HPI  Rick Mcbride. is a 81 y.o. male whose past medical history includes chronic kidney disease, ACS status post CABG with ongoing CAD, multiple orthopedic issues, prior tongue cancer history, etc.  He is on Plavix and aspirin.  He presents by EMS for evaluation after a fall.  He says that he was going into the living room tonight but made the mistake of not turning on the light.  He misjudged where he was when he tried to take a seat and fell in the dark, striking the back of his head on a piece of furniture and landing awkwardly on his right hip.  He said that his head and his neck do not hurt, but he has pain when he tries to move his right leg.  He always has numbness and tingling and it is no worse than normal in the right leg.  He did not lose consciousness.  He has had no nausea, vomiting, visual changes, chest pain, shortness of breath, nor abdominal pain.  He is able to wiggle his toes but if he tries to move his leg causes more pain.     Physical Exam   Triage Vital Signs: ED Triage Vitals  Enc Vitals Group     BP 06/10/22 0002 (!) 160/78     Pulse Rate 06/10/22 0002 69     Resp 06/10/22 0002 20     Temp 06/10/22 0002 98.3 F (36.8 C)     Temp Source 06/10/22 0002 Oral     SpO2 06/10/22 0002 95 %     Weight 06/10/22 0000 95.2 kg (209 lb 14.1 oz)     Height 06/10/22 0000 1.803 m (5\' 11" )     Head Circumference --      Peak Flow --      Pain Score --      Pain Loc --      Pain Edu? --      Excl. in [place]? --     Most recent vital signs: Vitals:   06/10/22 0002 06/10/22 0136  BP: (!) 160/78 (!) 156/83  Pulse: 69 66  Resp: 20 15  Temp: 98.3 F (36.8 C)   SpO2: 95% 99%     General: Awake, no distress.  CV:  Good peripheral perfusion.  Normal heart sounds.  Pacemaker in place in the left  chest. Resp:  Normal effort.  Lungs are clear to auscultation bilaterally. Abd:  No distention.  No tenderness to palpation. Other:  Shortening and external rotation of right lower extremity.  Patient is able to wiggle his toes and he has normal peripheral perfusion.  Tenderness and pain with any amount of manipulation of the extremity, and the pain is localized to the proximal humerus.   ED Results / Procedures / Treatments   Labs (all labs ordered are listed, but only abnormal results are displayed) Labs Reviewed  CBC WITH DIFFERENTIAL/PLATELET - Abnormal; Notable for the following components:      Result Value   RBC 3.98 (*)    Hemoglobin 11.5 (*)    HCT 36.9 (*)    All other components within normal limits  COMPREHENSIVE METABOLIC PANEL - Abnormal; Notable for the following components:   Glucose, Bld 123 (*)    BUN 32 (*)    Creatinine,  Ser 2.96 (*)    Total Protein 6.4 (*)    GFR, Estimated 21 (*)    All other components within normal limits  PROTIME-INR  TYPE AND SCREEN  TROPONIN I (HIGH SENSITIVITY)  TROPONIN I (HIGH SENSITIVITY)     EKG  ED ECG REPORT I, Hinda Kehr, the attending physician, personally viewed and interpreted this ECG.  Date: 06/10/2022 EKG Time: 00: 02 Rate: 69 Rhythm: Ectopic atrial rhythm QRS Axis: normal Intervals: normal ST/T Wave abnormalities: T wave inversions in leads II, 3, aVF, and lateral leads. Narrative Interpretation: no definitive evidence of acute ischemia; does not meet STEMI criteria.    RADIOLOGY I viewed and interpreted the patient's chest x-ray and hip/pelvis x-rays.  Chest x-ray is unremarkable other than pacemaker.  Hip x-ray is notable for right femoral neck fracture with displacement.  Radiology confirms no acute abnormalities on chest, fracture of right femoral neck.  I also viewed and interpreted the patient's head CT and cervical spine CT.  No evidence of acute intracranial bleed or skull fracture or fracture of  the C-spine.  I also read the radiologist's reports, which confirmed no acute findings.    PROCEDURES:  Critical Care performed: No  .1-3 Lead EKG Interpretation  Performed by: Hinda Kehr, MD Authorized by: Hinda Kehr, MD     Interpretation: normal     ECG rate:  68   ECG rate assessment: normal     Rhythm: sinus rhythm     Ectopy: none     Conduction: normal   ..Laceration Repair  Date/Time: 06/10/2022 1:53 AM  Performed by: Hinda Kehr, MD Authorized by: Hinda Kehr, MD   Consent:    Consent obtained:  Verbal   Consent given by:  Patient Anesthesia:    Anesthesia method:  None Laceration details:    Location:  Scalp   Scalp location:  Occipital   Length (cm):  2 Exploration:    Contaminated: no   Treatment:    Amount of cleaning:  Standard   Irrigation solution:  Sterile saline   Visualized foreign bodies/material removed: no   Skin repair:    Repair method: 1 large Derma-Clip. Approximation:    Approximation:  Close Repair type:    Repair type:  Simple Post-procedure details:    Dressing:  Open (no dressing)   Procedure completion:  Tolerated well, no immediate complications    MEDICATIONS ORDERED IN ED: Medications  morphine (PF) 4 MG/ML injection 4 mg (4 mg Intravenous Given 06/10/22 0134)  sodium chloride 0.9 % bolus 1,000 mL (1,000 mLs Intravenous New Bag/Given 06/10/22 0153)    And  0.9 %  sodium chloride infusion (has no administration in time range)  ceFAZolin (ANCEF) IVPB 2g/100 mL premix (has no administration in time range)  lactated ringers bolus 1,000 mL (1,000 mLs Intravenous New Bag/Given 06/10/22 0134)     IMPRESSION / MDM / ASSESSMENT AND PLAN / ED COURSE  I reviewed the triage vital signs and the nursing notes.                              Differential diagnosis includes, but is not limited to, intracranial bleed, skull fracture, cervical spine injury, fracture or dislocation of the patient's hip.  Patient's presentation is  most consistent with acute presentation with potential threat to life or bodily function.  Labs/studies ordered: CBC with differential, comprehensive metabolic panel, high-sensitivity troponin, type and screen, pro time INR, EKG, chest x-ray,  hip/pelvis x-rays, CT head, CT cervical spine.  The patient is on the cardiac monitor to evaluate for evidence of arrhythmia and/or significant heart rate changes.  Patient is NPO.  I ordered 1 L normal saline IV bolus followed by a maintenance rate of 125 mL/h.  EKG is abnormal but not representative of acute STEMI, and the patient is having no chest pain or shortness of breath.  Vital signs are stable other than hypertension.  As documented above, imaging is notable for an essentially normal chest x-ray but right femoral neck fracture.  CT scans are negative for acute injury.  Lab results notable for an elevation of his creatinine over baseline but he has well-documented chronic kidney disease.  Fluid bolus should help.  CBC is essentially normal.  I consulted by phone with [name] with orthopedics.  He said he will likely take the patient to the OR for cervical fixation of his right femoral neck fracture later today and asked that the patient remain NPO.  I consulted the hospitalist for admission.  I discussed all of these plans with the patient and his wife who is at bedside.  They understand and agree with the plan.  He is currently not experiencing any pain but I ordered morphine 4 mg IV every 2 hours x3 doses should he start to have pain.   Clinical Course as of 06/10/22 0154  Thu Jun 10, 2022  0136 Consulted by phone with Dr. Damita Dunnings with the hospitalist service.  She agrees with the plan and will admit. [CF]    Clinical Course User Index [CF] Hinda Kehr, MD     FINAL CLINICAL IMPRESSION(S) / ED DIAGNOSES   Final diagnoses:  Closed displaced fracture of right femoral neck ([place])  Fall, initial encounter  Laceration of scalp, initial  encounter  Chronic kidney disease, unspecified CKD stage     Rx / DC Orders   ED Discharge Orders     None        Note:  This document was prepared using Dragon voice recognition software and may include unintentional dictation errors.   Hinda Kehr, MD 06/10/22 [id number]    Hinda Kehr, MD 06/10/22 [phone]

## 2022-06-10 NOTE — Assessment & Plan Note (Signed)
Completed XRT in February 2023

## 2022-06-10 NOTE — Progress Notes (Signed)
[facility]  ROUNDING NOTE   Subjective:   Rick T Lamorris Knoblock. is a 81 year old male with past medical history including lung cancer, CAD with CABG, CVA, hypertension, and chronic kidney disease stage IV.  Patient presents to the emergency department via EMS after sustaining a fall at home.  Patient has been admitted for Fracture of femoral neck, right, closed ([place]) [S72.001A] Fall, initial encounter [W19.XXXA] Laceration of scalp, initial encounter [S01.01XA] Closed displaced fracture of right femoral neck (HCC) [S72.001A] Chronic kidney disease, unspecified CKD stage [N18.9]  Patient is known to our practice and receives outpatient follow-up by Dr. Juleen China.  Patient was last seen in our office on 03/22/2022 by Dr. Holley Raring for routine follow-up.  Patient found to have stable renal function at that time.  Patient is seen today resting in bed, appears uncomfortable.  Wife at bedside.  Wife states patient sustained a fall last night and they called EMS after patient was unable to get up.  Patient complains of right hip pain.  Has received pain medication but states he has not been effective.  Currently n.p.o. awaiting surgery.  Patient was in normal state of health prior to fall.  Denies nausea, vomiting, or diarrhea.  Denies chest pain or discomfort.  Denies head injury during fall.  Labs on ED arrival show creatinine 2.96 with GFR 21.  Labs this a.m. show creatinine 2.76 with GFR 22.  No urine output recorded however clear yellow urine noticed in canister during visit.  CT head shows a small left perioccipital scalp hematoma and age-related changes.  CT spine negative for acute changes.  Right hip x-ray shows displaced right femoral neck fracture.  We have been consulted to monitor renal function.   Objective:  Vital signs in last 24 hours:  Temp:  [98.3 F (36.8 C)-99.1 F (37.3 C)] 98.6 F (37 C) (08/03 1231) Pulse Rate:  [66-93] 81 (08/03 1231) Resp:  [15-20] 16 (08/03  1231) BP: (117-162)/(68-83) 140/74 (08/03 1231) SpO2:  [90 %-99 %] 90 % (08/03 1231) Weight:  [95.2 kg] 95.2 kg (08/03 1231)  Weight change:  Filed Weights   06/10/22 0000 06/10/22 1231  Weight: 95.2 kg 95.2 kg    Intake/Output: No intake/output data recorded.   Intake/Output this shift:  Total I/O In: 418.3 [I.V.:418.3] Out: -   Physical Exam: General: tense, grimacing, visibly uncomfortable  Head: Normocephalic, atraumatic. Moist oral mucosal membranes  Eyes: Anicteric  Lungs:  Clear to auscultation, normal effort, room air  Heart: Regular rate and rhythm  Abdomen:  Soft, nontender, nondistended  Extremities: Trace peripheral edema.  Neurologic: Nonfocal, moving all four extremities  Skin: No lesions  Access: None    Basic Metabolic Panel: Recent Labs  Lab 06/10/22 0001 06/10/22 0243  NA 142 140  K 4.7 4.4  CL 109 109  CO2 25 21*  GLUCOSE 123* 124*  BUN 32* 31*  CREATININE 2.96* 2.76*  CALCIUM 9.3 8.9    Liver Function Tests: Recent Labs  Lab 06/10/22 0001  AST 19  ALT 20  ALKPHOS 68  BILITOT 0.4  PROT 6.4*  ALBUMIN 3.7   No results for input(s): "LIPASE", "AMYLASE" in the last 168 hours. No results for input(s): "AMMONIA" in the last 168 hours.  CBC: Recent Labs  Lab 06/10/22 0001  WBC 6.6  NEUTROABS 5.0  HGB 11.5*  HCT 36.9*  MCV 92.7  PLT 167    Cardiac Enzymes: No results for input(s): "CKTOTAL", "CKMB", "CKMBINDEX", "TROPONINI" in the last 168 hours.  BNP: Invalid input(s): "POCBNP"  CBG: No results for input(s): "GLUCAP" in the last 168 hours.  Microbiology: Results for orders placed or performed during the hospital encounter of 06/09/22  Surgical PCR screen     Status: None   Collection Time: 06/10/22  8:15 AM   Specimen: Nasal Mucosa; Nasal Swab  Result Value Ref Range Status   MRSA, PCR NEGATIVE NEGATIVE Final   Staphylococcus aureus NEGATIVE NEGATIVE Final    Comment: (NOTE) The Xpert SA Assay (FDA approved for  NASAL specimens in patients 17 years of age and older), is one component of a comprehensive surveillance program. It is not intended to diagnose infection nor to guide or monitor treatment. Performed at [facility], [address] [place], [place] [address]     Coagulation Studies: Recent Labs    06/10/22 0001  LABPROT 13.4  INR 1.0    Urinalysis: No results for input(s): "COLORURINE", "LABSPEC", "PHURINE", "GLUCOSEU", "HGBUR", "BILIRUBINUR", "KETONESUR", "PROTEINUR", "UROBILINOGEN", "NITRITE", "LEUKOCYTESUR" in the last 72 hours.  Invalid input(s): "APPERANCEUR"    Imaging: DG Chest 1 View  Result Date: 06/10/2022 CLINICAL DATA:  Fall with right hip fracture. EXAM: CHEST  1 VIEW COMPARISON:  CT 11/04/2020, radiograph 03/26/2017 FINDINGS: Prior median sternotomy. Left-sided pacemaker in place. Mild cardiomegaly. Stable mediastinal contours. Scarring at the right lung base. No acute consolidation. No pleural fluid or pneumothorax. No pulmonary edema. Bilateral proximal humeral arthroplasties. IMPRESSION: No acute abnormality. Electronically Signed   By: Keith Rake M.D.   On: 06/10/2022 00:58   DG Hip Unilat  With Pelvis 2-3 Views Right  Result Date: 06/10/2022 CLINICAL DATA:  Fall with right hip pain. EXAM: DG HIP (WITH OR WITHOUT PELVIS) 2-3V RIGHT COMPARISON:  None Available. FINDINGS: Displaced right femoral neck fracture with mild comminution. There is proximal migration of the femoral shaft. Femoral head remains seated. Pubic rami and remainder of the bony pelvis are intact. Multiple pelvic phleboliths. Iliac stents are partially included. Lumbosacral hardware. IMPRESSION: Displaced right femoral neck fracture. Electronically Signed   By: Keith Rake M.D.   On: 06/10/2022 00:56   CT Cervical Spine Wo Contrast  Result Date: 06/10/2022 CLINICAL DATA:  Neck trauma (Age >= 65y) Ground level fall. EXAM: CT CERVICAL SPINE WITHOUT CONTRAST TECHNIQUE: Multidetector  CT imaging of the cervical spine was performed without intravenous contrast. Multiplanar CT image reconstructions were also generated. RADIATION DOSE REDUCTION: This exam was performed according to the departmental dose-optimization program which includes automated exposure control, adjustment of the mA and/or kV according to patient size and/or use of iterative reconstruction technique. COMPARISON:  Soft tissue neck CT 07/10/2021 FINDINGS: Alignment: Straightening of normal lordosis. No traumatic subluxation. Skull base and vertebrae: No acute fracture. Vertebral body heights are maintained. The dens and skull base are intact. Soft tissues and spinal canal: No prevertebral fluid or swelling. No visible canal hematoma. Please note patient's known base of tongue lesion is not well assessed on the current exam. 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 Upper chest: Apical emphysema.  No acute findings Other: None. IMPRESSION: 1. No acute fracture or traumatic subluxation of the cervical spine. 2. 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 Electronically Signed   By: Keith Rake M.D.   On: 06/10/2022 00:44   CT HEAD WO CONTRAST (5MM)  Result Date: 06/10/2022 CLINICAL DATA:  Head trauma, moderate-severe Ground level fall.  Head CT 03/04/2021 EXAM: CT HEAD WITHOUT CONTRAST TECHNIQUE: Contiguous axial images were obtained from the base of the  skull through the vertex without intravenous contrast. RADIATION DOSE REDUCTION: This exam was performed according to the departmental dose-optimization program which includes automated exposure control, adjustment of the mA and/or kV according to patient size and/or use of iterative reconstruction technique. COMPARISON:  Head CT 03/04/2021 FINDINGS: Brain: No acute intracranial hemorrhage. Stable degree of generalized atrophy with prominent periventricular chronic small vessel ischemia. Chronic inferior right frontal encephalomalacia. Remote right  cerebellar infarct. Remote lacunar infarct in the left thalamus. No subdural or extra-axial collection. No midline shift or mass effect. Vascular: Atherosclerosis of skullbase vasculature without hyperdense vessel or abnormal calcification. Skull: No fracture or focal lesion. Sinuses/Orbits: Mucosal thickening of the left frontal sinus and scattered throughout ethmoid air cells. No mastoid effusion. Bilateral cataract resection. Other: Small left parietooccipital scalp hematoma. IMPRESSION: 1. Small left parietooccipital scalp hematoma. No acute intracranial abnormality. No skull fracture. 2. Stable atrophy, chronic small vessel ischemia, and remote infarcts. Electronically Signed   By: Keith Rake M.D.   On: 06/10/2022 00:37     Medications:    sodium chloride 125 mL/hr at 06/10/22 0235   [MAR Hold]  ceFAZolin (ANCEF) IV      [MAR Hold] atorvastatin  40 mg Oral QHS   [MAR Hold] calcitRIOL  0.25 mcg Oral Daily   [MAR Hold] donepezil  10 mg Oral QHS   [MAR Hold] ezetimibe  10 mg Oral Daily   [MAR Hold] ferrous sulfate  325 mg Oral BID   [MAR Hold] losartan  100 mg Oral Daily   [MAR Hold] melatonin  10 mg Oral QHS   [MAR Hold] metoprolol succinate  25 mg Oral Daily   [MAR Hold] pantoprazole  40 mg Oral Daily   [MAR Hold] HYDROcodone-acetaminophen, [MAR Hold]  morphine injection, [MAR Hold] ondansetron **OR** [MAR Hold] ondansetron (ZOFRAN) IV  Assessment/ Plan:  Mr. Troyce Gieske. is a 81 y.o.  male past medical history including lung cancer, CAD with CABG, CVA, hypertension, and chronic kidney disease stage IV.  Patient presents to the emergency department via EMS after sustaining a fall at home.  Patient has been admitted for Fracture of femoral neck, right, closed ([place]) [S72.001A] Fall, initial encounter [W19.XXXA] Laceration of scalp, initial encounter [S01.01XA] Closed displaced fracture of right femoral neck ([place]) [S72.001A] Chronic kidney disease, unspecified CKD stage  [N18.9]   Chronic kidney disease stage IV with baseline creatinine 2.66 and GFR 23 on 03/15/2022.  Chronic kidney disease secondary to chronic NSAID use, hypertension, and age.  Receives regular follow-up outpatient with our office.  Renal function remains stable at this time.  Continue to avoid nephrotoxic agents and therapies if possible.  Also avoid hypotension.  Currently receiving losartan daily.  Okay to continue this for right now.  We will continue to monitor.  Lab Results  Component Value Date   CREATININE 2.76 (H) 06/10/2022   CREATININE 2.96 (H) 06/10/2022   CREATININE 2.50 (H) 07/10/2021    Intake/Output Summary (Last 24 hours) at 06/10/2022 1418 Last data filed at 06/10/2022 0739 Gross per 24 hour  Intake 418.25 ml  Output 0 ml  Net 418.25 ml   2. Anemia of chronic kidney disease Lab Results  Component Value Date   HGB 11.5 (L) 06/10/2022    Hemoglobin within desired target.  We will continue to monitor  3.  Hypertension with chronic kidney disease.  Home regimen includes losartan and metoprolol.  Currently receiving these medications.  4. Secondary Hyperparathyroidism: with outpatient labs: PTH 170, phosphorus 4.3, calcium 9.5 on  03/15/2022.   Lab Results  Component Value Date   CALCIUM 8.9 06/10/2022  Calcium within acceptable range.  Patient receives calcitriol and cholecalciferol outpatient.  5.  Closed right femoral neck fracture, status post fall.  Orthopedic surgery consulted.  Surgery scheduled later today.  Pain control    LOS: 0   8/3/20232:18 PM

## 2022-06-10 NOTE — Assessment & Plan Note (Addendum)
AAA s/p endovascular repair Followed by vascular surgeon, Dr. Lucky Cowboy On Plavix, aspirin and atorvastatin Patient denies abdominal pain or chest pain Holding Plavix and aspirin for surgery

## 2022-06-10 NOTE — Anesthesia Preprocedure Evaluation (Addendum)
Anesthesia Evaluation  Patient identified by MRN, date of birth, ID band Patient awake    Reviewed: Allergy & Precautions, NPO status , Patient's Chart, lab work & pertinent test results  Airway Mallampati: III  TM Distance: >3 FB Neck ROM: full    Dental  (+) Edentulous Upper, Edentulous Lower   Pulmonary neg pulmonary ROS, former smoker,  S/p RLL resection for lung ca in 2014   Pulmonary exam normal        Cardiovascular hypertension, + CAD and + CABG (3v 2005)  Normal cardiovascular exam+ pacemaker (dual chamber 2014) + Valvular Problems/Murmurs MR   endovascular AAA repair 2018  Troponin 17 EKG,Ectopic atrial rhythm at 69 no acute ST-T wave changes  TTE 10/24/2019 --> LVEF 45-50%; trivial TR, mild AR and MR; moderate LA dilitation   Neuro/Psych PSYCHIATRIC DISORDERS Mild cognitive impairmentCT HEAD: Stable atrophy, chronic small vessel ischemia, and remote Infarcts. 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  spinal fusion on chronic opiates CVA, No Residual Symptoms    GI/Hepatic Neg liver ROS, GERD  ,  Endo/Other  negative endocrine ROS  Renal/GU CRFRenal diseasebaseline of 2.03. Acute renal failure superimposed on stage 4 chronic kidney disease      Musculoskeletal   Abdominal (+) + obese,   Peds  Hematology  (+) Blood dyscrasia, anemia ,   Anesthesia Other Findings Pt with accidental fall when he tripped after getting up from bed to go to the bathroom without LOC. Suffered left parietooccipital scalp hematoma and laceration and right femoral neck fracture.  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 b.) native aneurysm sac 6.6 x               6.9 cm by CT on 03/31/2021 No date: Abnormality of tongue     Comment:  a.) CT head/neck 07/10/2021 --> asymmetric soft tissue                at the RIGHT tongue base with a superficial 8 mm lesion. No date: Anemia No date: Aneurysm of right common iliac artery (HCC)     Comment:  a.) measured 2.7 cm by CT on 03/31/2021 No date: Anxiety No date: Aortic atherosclerosis (HCC) No date: Atrophic kidney No date: B12 deficiency No date: CAD (coronary artery disease) No date: Cervical radiculopathy No date: Chronic airway obstruction (HCC) No date: Chronic kidney disease (CKD), stage III (moderate) ([place]) 06/16/2021: Chronic pain syndrome 11/12/2019: Chronic right shoulder pain No date: Chronic tension headaches 06/16/2021: Chronic, continuous use of opioids No date: Coronary artery disease No date: DDD (degenerative disc disease), lumbar No date: Degenerative disc disease, lumbar     Comment:  with lumbar radiculopathy No date: Elbow fracture, left No date: GERD (gastroesophageal reflux disease) No date: H/O adenomatous polyp of colon No date: H/O hemorrhoids No date: HTN (hypertension) No date: Hyperlipidemia No date: Meralgia paresthetica No date: Mild cognitive impairment No date: Nephrolithiasis No date: Neuralgia 02/19/2021: Numbness of right foot No date: Osteoarthritis No date: Pars defect of lumbar spine     Comment:  L5 bilat w/anteriolisthesis No date: Presence of permanent cardiac pacemaker No date: Prostate cancer ([place])     Comment:  a.) s/p prostatectomy 05/01/2004: S/P CABG x 3     Comment:  a.) LVEF 40-49%; LIMA-LAD, SVG-OM1, SVG-PDA No date: Second degree AV block No date: Sinoatrial node dysfunction (  [place]) 01/31/2013: 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  Past Surgical History: No date: CATARACT EXTRACTION; Bilateral No date: COLONOSCOPY No date: COLONOSCOPY 10/20/2015: COLONOSCOPY WITH PROPOFOL; N/A     Comment:  Procedure: COLONOSCOPY WITH PROPOFOL;  Surgeon: Manya Silvas, MD;  Location: [place];  Service:               Endoscopy;  Laterality: N/A; 11/28/2020: COLONOSCOPY WITH PROPOFOL; N/A     Comment:  Procedure: COLONOSCOPY WITH PROPOFOL;  Surgeon: Robert Bellow, MD;  Location: [facility];  Service:               Endoscopy;  Laterality: N/A; 05/01/2004: CORONARY ARTERY BYPASS GRAFT; N/A     Comment:  Procedure: 3v CABG (LIMA-LAD, SVG-OM1, SVG-PDA);               Location: [facility]; Surgeon: Ander Gaster, MD 12/27/2016: EMBOLIZATION; Right     Comment:  Procedure: Embolization;  Surgeon: Algernon Huxley, MD;                Location: [place] [facility];  Service: Cardiovascular;              Laterality: Right; 01/05/2017: ENDOVASCULAR REPAIR/STENT GRAFT; N/A     Comment:  Procedure: Endovascular Repair/Stent Graft;  Surgeon:               Algernon Huxley, MD;  Location: [place] [facility];                Service: Cardiovascular;  Laterality: N/A; No date: INSERT / REPLACE / REMOVE PACEMAKER No date: JOINT REPLACEMENT     Comment:  shoulder and knees No date: KNEE ARTHROSCOPY 01/31/2013: LUNG LOBECTOMY; Right     Comment:  Procedure: RIGHT PULMONARY LOBECTOMY; Location: [place];               Surgeon: Nestor Lewandowsky, MD 08/10/2021: MICROLARYNGOSCOPY; Right     Comment:  Procedure: MICRODIRECT LARYNGOSCOPY WITH BIOPSY OF               TONGUE BASE;  Surgeon: Beverly Gust, MD;  Location:               [facility];  Service: ENT;  Laterality: Right; 12/2012: PACEMAKER INSERTION     Comment:  Dual chanber pacemaker generator No date: POLYPECTOMY No date: POSTERIOR LUMBAR FUSION     Comment:  Procedure: POSTERIOR LUMBAR INTERBODY FUSION, INTERBODY               PROSTHESIS, POSTERIOR  LATERAL ARTHRODESIS, POSTERIOR               NON-SEGMENTAL INSTRUMENTATION LUMBAR FIVE- SACRAL ONE;               Location: [facility]; Surgeon: Newman Pies,  MD No date: PROSTATECTOMY 08/10/2021: TONGUE BIOPSY; N/A     Comment:  Procedure: TONGUE BIOPSY;  Surgeon: Beverly Gust,               MD;  Location: [facility];  Service: ENT;  Laterality: N/A; No date: TOTAL KNEE ARTHROPLASTY; Bilateral 07/10/2015: TOTAL SHOULDER ARTHROPLASTY; Left     Comment:  Procedure: TOTAL SHOULDER ARTHROPLASTY;  Surgeon: [name], MD;  Location: [facility];  Service: Orthopedics;                Laterality: Left; No date: TOTAL SHOULDER REPLACEMENT  BMI    Body Mass Index: 29.27 kg/m      Reproductive/Obstetrics negative OB ROS                          Anesthesia Physical Anesthesia Plan  ASA: 3  Anesthesia Plan: General ETT   Post-op Pain Management: Ofirmev IV (intra-op)* and Regional block*   Induction: Intravenous  PONV Risk Score and Plan: 2 and Ondansetron and Dexamethasone  Airway Management Planned: Oral ETT  Additional Equipment: Arterial line  Intra-op Plan:   Post-operative Plan: Extubation in OR  Informed Consent: I have reviewed the patients History and Physical, chart, labs and discussed the procedure including the risks, benefits and alternatives for the proposed anesthesia with the patient or authorized representative who has indicated his/her understanding and acceptance.     Dental Advisory Given  Plan Discussed with: Anesthesiologist, CRNA and Surgeon  Anesthesia Plan Comments:        Anesthesia Quick Evaluation

## 2022-06-10 NOTE — Transfer of Care (Signed)
Immediate Anesthesia Transfer of Care Note  Patient: Rick Mcbride.  Procedure(s) Performed: ARTHROPLASTY BIPOLAR HIP (HEMIARTHROPLASTY) (Right: Hip)  Patient Location: PACU  Anesthesia Type:General  Level of Consciousness: awake and patient cooperative  Airway & Oxygen Therapy: Patient Spontanous Breathing and Patient connected to face mask oxygen  Post-op Assessment: Report given to RN and Post -op Vital signs reviewed and stable  Post vital signs: Reviewed and stable  Last Vitals:  Vitals Value Taken Time  BP 117/79 06/10/22 1640  Temp    Pulse 100 06/10/22 1642  Resp 20 06/10/22 1642  SpO2 93 % 06/10/22 1642    Last Pain:  Vitals:   06/10/22 1231  TempSrc: Oral  PainSc: 3       Patients Stated Pain Goal: 3 (56/86/16 [id number])  Complications: No notable events documented.

## 2022-06-10 NOTE — Assessment & Plan Note (Signed)
Continue Aricept Delirium precautions

## 2022-06-10 NOTE — Anesthesia Procedure Notes (Addendum)
Procedure Name: Intubation Date/Time: 06/10/2022 2:21 PM  Performed by: Loletha Grayer, CRNAPre-anesthesia Checklist: Patient identified, Patient being monitored, Timeout performed, Emergency Drugs available and Suction available Patient Re-evaluated:Patient Re-evaluated prior to induction Oxygen Delivery Method: Circle system utilized Preoxygenation: Pre-oxygenation with 100% oxygen Induction Type: IV induction Ventilation: Mask ventilation without difficulty Laryngoscope Size: McGraph and 4 Grade View: Grade I Tube type: Oral Tube size: 7.5 mm Number of attempts: 1 Airway Equipment and Method: Stylet Placement Confirmation: ETT inserted through vocal cords under direct vision, positive ETCO2 and breath sounds checked- equal and bilateral Secured at: 22 cm Tube secured with: Tape Dental Injury: Teeth and Oropharynx as per pre-operative assessment

## 2022-06-10 NOTE — Progress Notes (Signed)
[facility] Progress Note  Patient: Rick Mcbride.    ZOX:[id number]  DOA: 06/09/2022    Date of Service: the patient was seen and examined on 06/10/2022  Brief hospital course: 81 year old male with past medical history of hypertension, lung cancer status post lobectomy, cancer of the base of the tongue status post radiation therapy which completed in February of this year, CAD status post CABG, stage IV chronic kidney disease, secondary AV block status post pacemaker, mild cognitive deficit who presented to the emergency room on the early morning of 8/3 after he sustained a accidental fall while going to the bathroom.  Patient fell hitting his head, but did not lose consciousness, but he was unable to stand.  The emergency room, patient found to have a creatinine of 2.96 (baseline of 2.03) and x-rays noted a displaced right femoral neck fracture.  Patient was admitted to the hospitalist service and orthopedics were consulted who plan to take patient to the operating room on afternoon of 2/3.  Assessment and Plan: Assessment and Plan: * Fracture of femoral neck, right, closed (HCC) Moderate risk given comorbidities, but overall stable for surgery.  Kept NPO.  Held aspirin and Plavix.  Morphine for pain control NPO.  Plan is for hip repair later today.  Afterwards, monitor for blood loss anemia.  Suspect patient will need short-term skilled nursing.  Acute renal failure superimposed on stage 4 chronic kidney disease [facility]) Nephrology consulted.  Creatinine 3-1/2 years ago at 2.03 with a GFR of 36.  Presented to the emergency room with creatinine of 2.96 and GFR of 21.  Started on IV fluids.  We will recheck in the morning.  Thoracic aortic aneurysm without rupture [facility]) AAA s/p endovascular repair Followed by vascular surgeon, Dr. Lucky Cowboy On Plavix, aspirin and atorvastatin Patient denies abdominal pain or chest pain Holding Plavix and aspirin for surgery  Primary squamous cell carcinoma  of base of tongue ([place]) Completed XRT in February 2023  Presence of permanent cardiac pacemaker Gets scheduled pacemaker checks.  No acute issues suspected  CAD S/P CABG x 3 Patient denies chest pain or shortness of breath.  Has good functional capacity Last seen by his cardiologist in March 2023 Currently on aspirin and atorvastatin and metoprolol   Cancer of lung ([place]) S/p lobectomy.  Follows with oncology  Mild cognitive impairment Continue Aricept Delirium precautions  Essential hypertension Blood pressure under fair control Continue losartan and metoprolol  Overweight (BMI 25.0-29.9) Meets criteria BMI greater than 25       Body mass index is 29.27 kg/m.        Consultants: Orthopedic surgery Nephrology  Procedures: Plan hip fracture repair 8/3  Antimicrobials: Preop Ancef  Code Status: Full code   Subjective: Patient complains of hip soreness  Objective: Vital signs were reviewed and unremarkable. Vitals:   06/10/22 1113 06/10/22 1231  BP: 125/72 (!) 140/74  Pulse: 81 81  Resp: 16 16  Temp: 98.8 F (37.1 C) 98.6 F (37 C)  SpO2: 95% 90%    Intake/Output Summary (Last 24 hours) at 06/10/2022 1443 Last data filed at 06/10/2022 1423 Gross per 24 hour  Intake 518.25 ml  Output 0 ml  Net 518.25 ml   Filed Weights   06/10/22 0000 06/10/22 1231  Weight: 95.2 kg 95.2 kg   Body mass index is 29.27 kg/m.  Exam:  General: Alert and oriented x2, no acute distress HEENT: Normocephalic, atraumatic, mucous membranes slightly dry Cardiovascular: Regular rate and rhythm, S1-S2 Respiratory: Clear  to auscultation bilaterally Abdomen: Soft, nontender, nondistended, positive bowel sounds Musculoskeletal: No clubbing or cyanosis or edema Skin: No skin breaks, tears or lesions Psychiatry: Appropriate, no evidence of psychoses Neurology: No focal deficits  Data Reviewed: No new labs  Disposition:  Status is: Inpatient Remains inpatient  appropriate because: Surgery and disposition    Anticipated discharge date: 8/7  Family Communication: Left message for family DVT Prophylaxis: SCDs Start: 06/10/22 0230    Author: Annita Brod ,MD 06/10/2022 2:43 PM  To reach On-call, see care teams to locate the attending and reach out via [email or web address]. Between 7PM-7AM, please contact night-coverage If you still have difficulty reaching the attending provider, please page the [facility] (Director on Call) for [facility] on amion for assistance.

## 2022-06-10 NOTE — Hospital Course (Addendum)
81 year old male with past medical history of hypertension, lung cancer status post lobectomy, cancer of the base of the tongue status post radiation therapy which completed in February of this year, CAD status post CABG, stage IV chronic kidney disease, secondary AV block status post pacemaker, mild cognitive deficit who presented to the emergency room on the early morning of 8/3 after he sustained a accidental fall while going to the bathroom.  Patient fell hitting his head, but did not lose consciousness, but he was unable to stand.  The emergency room, patient found to have a creatinine of 2.96 (baseline of 2.03) and x-rays noted a displaced right femoral neck fracture.  Patient was admitted to the hospitalist service and orthopedics were consulted who plan to take patient to the operating room on afternoon of 8/3.  Postop, patient with blood loss anemia from surgery.  Was receiving hydration for renal function.  Patient blood pressure has been trending up slowly since hospitalization.  On 8/6, BNP checked and found to be elevated at 1500.  No history of CHF.  Fluids stopped and started on IV Lasix.  Echocardiogram ordered.  Seen by PT and OT who are recommending skilled nursing.

## 2022-06-10 NOTE — Plan of Care (Signed)

## 2022-06-10 NOTE — Assessment & Plan Note (Signed)
Gets scheduled pacemaker checks.  No acute issues suspected

## 2022-06-10 NOTE — Assessment & Plan Note (Signed)
Meets criteria BMI greater than 25 

## 2022-06-10 NOTE — Assessment & Plan Note (Addendum)
Nephrology consulted.  Creatinine 3-1/2 years ago at 2.03 with a GFR of 36.  Presented to the emergency room with creatinine of 2.96 and GFR of 21.  Patient aggressively fluid resuscitated and then.  Once volume overloaded and started on Lasix.  Creatinine down to 2.3 today with GFR of 28.

## 2022-06-10 NOTE — Progress Notes (Signed)
Initial Nutrition Assessment  DOCUMENTATION CODES:   Not applicable  INTERVENTION:   -Once diet is advanced, add:  -Ensure Enlive po BID, each supplement provides 350 kcal and 20 grams of protein -MVI with minerals daily  NUTRITION DIAGNOSIS:   Increased nutrient needs related to post-op healing as evidenced by estimated needs.  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  Pt admitted with rt femoral neck fracture.  Reviewed I/O's: 0 ml x 24 hours  UOP: 0 ml x 24 hours  Per orthopedics notes, plan for surgery today. RN confirmed plan for surgery.    Spoke with pt and wife at bedside. Pt has a very good appetite and typically eats 3 meals per day. Pt does not follow a specific diet and enjoys going to [facility].   Per wife, pt has had a functional decline over the past year secondary to back surgery. Pt has been less steady on her feet.   Reviewed wt hx; pt has experienced a 4.4% wt loss over the past 7 months. Pt denies weight loss.   Discussed importance of good meal and supplement intake to promote healing.   Medications reviewed and include melatonin, and 0.9% sodium chloride infusion @ 125 ml/hr.   Lab Results  Component Value Date   HGBA1C 5.5 06/06/2017   PTA DM medications are none.   Labs reviewed: CBGS: 88 (inpatient orders for glycemic control  are none).    NUTRITION - FOCUSED PHYSICAL EXAM:  Flowsheet Row Most Recent Value  Orbital Region No depletion  Upper Arm Region No depletion  Thoracic and Lumbar Region No depletion  Buccal Region No depletion  Temple Region No depletion  Clavicle Bone Region Mild depletion  Clavicle and Acromion Bone Region No depletion  Scapular Bone Region No depletion  Dorsal Hand No depletion  Patellar Region No depletion  Anterior Thigh Region No depletion  Posterior Calf Region No depletion  Edema (RD Assessment) None  Hair Reviewed  Eyes Reviewed  Mouth Reviewed  Skin Reviewed  Nails Reviewed       Diet Order:   Diet Order             Diet NPO time specified  Diet effective now                   EDUCATION NEEDS:   Education needs have been addressed  Skin:  Skin Assessment: Reviewed RN Assessment  Last BM:  06/09/22  Height:   Ht Readings from Last 1 Encounters:  06/10/22 5\' 11"  (1.803 m)    Weight:   Wt Readings from Last 1 Encounters:  06/10/22 95.2 kg    Ideal Body Weight:  78.2 kg  BMI:  Body mass index is 29.27 kg/m.  Estimated Nutritional Needs:   Kcal:  2150-2350  Protein:  105-120 grams  Fluid:  > 2 L    Loistine Chance, RD, LDN, CDCES Registered  Dietitian II Certified Diabetes Care and Education Specialist Please refer to [facility] for RD and/or RD on-call/weekend/after hours pager

## 2022-06-11 ENCOUNTER — Encounter: Payer: Self-pay | Admitting: Surgery

## 2022-06-11 ENCOUNTER — Other Ambulatory Visit (HOSPITAL_COMMUNITY): Payer: Self-pay

## 2022-06-11 ENCOUNTER — Telehealth (HOSPITAL_COMMUNITY): Payer: Self-pay | Admitting: Pharmacy Technician

## 2022-06-11 DIAGNOSIS — N184 Chronic kidney disease, stage 4 (severe): Secondary | ICD-10-CM

## 2022-06-11 DIAGNOSIS — G3184 Mild cognitive impairment, so stated: Secondary | ICD-10-CM

## 2022-06-11 DIAGNOSIS — N179 Acute kidney failure, unspecified: Secondary | ICD-10-CM | POA: Diagnosis not present

## 2022-06-11 DIAGNOSIS — E663 Overweight: Secondary | ICD-10-CM

## 2022-06-11 DIAGNOSIS — D62 Acute posthemorrhagic anemia: Secondary | ICD-10-CM | POA: Diagnosis not present

## 2022-06-11 DIAGNOSIS — S72001A Fracture of unspecified part of neck of right femur, initial encounter for closed fracture: Secondary | ICD-10-CM | POA: Diagnosis not present

## 2022-06-11 LAB — CBC
HCT: 30.4 % — ABNORMAL LOW (ref 39.0–52.0)
Hemoglobin: 9.5 g/dL — ABNORMAL LOW (ref 13.0–17.0)
MCH: 29.2 pg (ref 26.0–34.0)
MCHC: 31.3 g/dL (ref 30.0–36.0)
MCV: 93.5 fL (ref 80.0–100.0)
Platelets: 139 10*3/uL — ABNORMAL LOW (ref 150–400)
RBC: 3.25 MIL/uL — ABNORMAL LOW (ref 4.22–5.81)
RDW: 15.5 % (ref 11.5–15.5)
WBC: 11.4 10*3/uL — ABNORMAL HIGH (ref 4.0–10.5)
nRBC: 0 % (ref 0.0–0.2)

## 2022-06-11 LAB — BASIC METABOLIC PANEL
Anion gap: 6 (ref 5–15)
BUN: 38 mg/dL — ABNORMAL HIGH (ref 8–23)
CO2: 23 mmol/L (ref 22–32)
Calcium: 8.2 mg/dL — ABNORMAL LOW (ref 8.9–10.3)
Chloride: 110 mmol/L (ref 98–111)
Creatinine, Ser: 3.01 mg/dL — ABNORMAL HIGH (ref 0.61–1.24)
GFR, Estimated: 20 mL/min — ABNORMAL LOW (ref >60)
Glucose, Bld: 156 mg/dL — ABNORMAL HIGH (ref 70–99)
Potassium: 5.3 mmol/L — ABNORMAL HIGH (ref 3.5–5.1)
Sodium: 139 mmol/L (ref 135–145)

## 2022-06-11 LAB — PROCALCITONIN: Procalcitonin: 0.21 ng/mL

## 2022-06-11 LAB — POTASSIUM: Potassium: 4.9 mmol/L (ref 3.5–5.1)

## 2022-06-11 MED ORDER — SODIUM CHLORIDE 0.9 % IV SOLN
INTRAVENOUS | Status: DC
  Administered 2022-06-11: 1000 mL via INTRAVENOUS

## 2022-06-11 NOTE — Progress Notes (Signed)
[facility] Progress Note  Patient: Rick Mcbride.    IHK:[id number]  DOA: 06/09/2022    Date of Service: the patient was seen and examined on 06/11/2022  Brief hospital course: 81 year old male with past medical history of hypertension, lung cancer status post lobectomy, cancer of the base of the tongue status post radiation therapy which completed in February of this year, CAD status post CABG, stage IV chronic kidney disease, secondary AV block status post pacemaker, mild cognitive deficit who presented to the emergency room on the early morning of 8/3 after he sustained a accidental fall while going to the bathroom.  Patient fell hitting his head, but did not lose consciousness, but he was unable to stand.  The emergency room, patient found to have a creatinine of 2.96 (baseline of 2.03) and x-rays noted a displaced right femoral neck fracture.  Patient was admitted to the hospitalist service and orthopedics were consulted who plan to take patient to the operating room on afternoon of 8/3.  Assessment and Plan: Assessment and Plan: * Fracture of femoral neck, right, closed (HCC) Moderate risk given comorbidities, but overall stable for surgery.  Kept NPO.  Held aspirin and Plavix.  Morphine for pain control NPO.  Plan is for hip repair later today.  Afterwards, monitor for blood loss anemia.  PT recommending short-term skilled nursing  Acute postoperative anemia due to expected blood loss Hemoglobin had dropped from 11.5 to 9.5 on postop day 1, as to be expected.  This is likely the cause of patient's worsening renal failure.  In addition, patient's hemoglobin baseline is between 13-14 three years ago and given that he came in worsening acute kidney injury, suspect his hemoglobin is even worse than presented secondary to hemoconcentration.  Transfuse for hemoglobin below 7, but will hydrate more aggressively to get a better representation of his hemoglobin and renal function.  Acute  renal failure superimposed on stage 4 chronic kidney disease [facility]) Nephrology consulted.  Creatinine 3-1/2 years ago at 2.03 with a GFR of 36.  Presented to the emergency room with creatinine of 2.96 and GFR of 21.  Patient will need more IV fluids given worsening renal failure secondary to blood loss.  See above.  Thoracic aortic aneurysm without rupture [facility]) AAA s/p endovascular repair Followed by vascular surgeon, Dr. Lucky Cowboy On Plavix, aspirin and atorvastatin Patient denies abdominal pain or chest pain Holding Plavix and aspirin for surgery  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[place]) Completed XRT in February 2023  Presence of permanent cardiac pacemaker Gets scheduled pacemaker checks.  No acute issues suspected  CAD S/P CABG x 3 Patient denies chest pain or shortness of breath.  Has good functional capacity Last seen by his cardiologist in March 2023 Currently on aspirin and atorvastatin and metoprolol   Cancer of lung ([place]) S/p lobectomy.  Follows with oncology  Mild cognitive impairment Continue Aricept Delirium precautions  Essential hypertension Blood pressure under fair control Continue losartan and metoprolol  Overweight (BMI 25.0-29.9) Meets criteria BMI greater than 25       Body mass index is 29.27 kg/m.  Nutrition Problem: Increased nutrient needs Etiology: post-op healing     Consultants: Orthopedic surgery Nephrology  Procedures: Plan hip fracture repair 8/3  Antimicrobials: Preop Ancef  Code Status: Full code   Subjective: Patient complains of hip soreness when moving, otherwise no pain, denies any shortness of breath  Objective: Vital signs were reviewed and unremarkable. Vitals:   06/11/22 0719 06/11/22 1105  BP: 113/67 118/72  Pulse: 66 74  Resp: 16 16  Temp: 98.8 F (37.1 C) 98.7 F (37.1 C)  SpO2: 98% 99%    Intake/Output Summary (Last 24 hours) at 06/11/2022 1245 Last data filed at 06/11/2022 0653 Gross per  24 hour  Intake 848.33 ml  Output 650 ml  Net 198.33 ml    Filed Weights   06/10/22 0000 06/10/22 1231  Weight: 95.2 kg 95.2 kg   Body mass index is 29.27 kg/m.  Exam:  General: Alert and oriented x2, no acute distress HEENT: Normocephalic, atraumatic, mucous membranes slightly dry Cardiovascular: Regular rate and rhythm, S1-S2 Respiratory: Decreased breath sounds throughout Abdomen: Soft, nontender, nondistended, positive bowel sounds Musculoskeletal: No clubbing or cyanosis or edema Skin: No skin breaks, tears or lesions Psychiatry: Appropriate, no evidence of psychoses Neurology: No focal deficits  Data Reviewed: Creatinine up to 3.01.  Potassium initially 5.3 down to 4.9. Procalcitonin minimally elevated at 0.21 Hemoglobin at 9.5, down from 11.5  Disposition:  Status is: Inpatient Remains inpatient appropriate because: Short-term skilled nursing placement  Anticipated discharge date: 8/7  Family Communication: Left message for family DVT Prophylaxis: apixaban (ELIQUIS) tablet 2.5 mg Start: 06/11/22 1000 SCDs Start: 06/10/22 1739 apixaban (ELIQUIS) tablet 2.5 mg    Author: Annita Brod ,MD 06/11/2022 12:45 PM  To reach On-call, see care teams to locate the attending and reach out via [email or web address]. Between 7PM-7AM, please contact night-coverage If you still have difficulty reaching the attending provider, please page the [facility] (Director on Call) for [facility] on amion for assistance.

## 2022-06-11 NOTE — Evaluation (Signed)
Physical Therapy Evaluation Patient Details Name: Rick Mcbride. MRN: [id number] DOB: 11-09-1940 Today's Date: 06/11/2022  History of Present Illness  Rick Mcbride. is a 81 y.o. 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  He fell and hit the back of his head on a nightstand sustaining a laceration to the scalp but did not lose consciousness.   Clinical Impression  Pt admitted with above diagnosis. Pt received upright in bed agreeable to PT services. Reports being indep to mod-I with SPC in community at baseline. Relies on surfaces and furniture to ambulate at home.    To date, pt educated on posterior hip and WB precautions after posterior hemiarthroplasty. Pt relying on modA at RLE and torso for bed mobility with use of bed features. Pt able to maintain static sitting balance with education on UE placement and LE placement to maintain precautions. Noted SOB in sitting thus SPO2 assessed on RA. Noted reading 83-85% relying on 3L/min via [place] and ~5 minutes with mod VC's for PLB. Tolerating seated therex well as mentioned below. Education provided on reps/sets/frequency. OT entering room to assist in eval. MinA+2 to stand to RW with mod to max VC's for safe hand placement and maintaining hip flexion precautions. Bed also needed to be elevated for clearance of buttocks. Pt with very labored, shuffling steps ~2' at EOB with frequent buckling of RLE during stance phase reliant on heavy UE support of RW. Pt and rehab team deferring further mobility encouraging pt to rest in recliner. Spo2 on 3L/min maintained  >90% but pt visibly fatigued with increased WOB. Pt left in care of OT. Pt reliant on heavy person assist for mobility and heavy cues throughout for posterior hip precautions throughout session with significant difficulty with gait deviations reliant on supplemental O2. Based off of current impairments pt is very high falls risk and warrants STR placement at discharge. Pt currently with functional limitations due to the deficits listed below (see PT Problem List). Pt will benefit from skilled PT to increase their independence and safety with mobility to allow discharge to the venue listed below   Recommendations for follow up therapy are one component of a multi-disciplinary discharge planning process, led by the attending physician.  Recommendations may be updated based on patient status, additional functional criteria and insurance authorization.  Follow Up Recommendations Skilled nursing-short term rehab (<3 hours/day) Can patient physically be transported by private vehicle: No    Assistance Recommended at Discharge Frequent or constant Supervision/Assistance  Patient can return home with the following  A lot of help with bathing/dressing/bathroom;Two people to help with walking and/or transfers;Assistance with cooking/housework;Assist for transportation;Help with stairs or ramp for entrance    Equipment Recommendations Other (comment) (tbd by next venue of care)  Recommendations for Other Services       Functional Status Assessment Patient has had a recent decline in their functional status and demonstrates the ability to make significant improvements in function in a reasonable and predictable amount of time.     Precautions / Restrictions Precautions Precautions: Posterior Hip Precaution Booklet Issued: Yes (comment) Precaution Comments: R hemi, posterior precautions  Restrictions Weight Bearing Restrictions: Yes RLE Weight Bearing: Weight bearing as tolerated      Mobility  Bed  Mobility Overal bed mobility: Needs Assistance Bed Mobility: Supine to Sit     Supine to sit: HOB elevated, Mod assist     General bed mobility comments: modA at torso and RLE exiting bed to manage precautions and due to pain Patient Response: Cooperative  Transfers Overall transfer level: Needs assistance Equipment used: Rolling walker (2 wheels) Transfers: Sit to/from Stand Sit to Stand: Min assist, +2 physical assistance           General transfer comment: VC's for hand placement and hip flexion precautions    Ambulation/Gait Ambulation/Gait assistance: Min guard Gait Distance (Feet): 2 Feet Assistive device: Rolling walker (2 wheels) Gait Pattern/deviations: Step-to pattern, Shuffle, Knees buckling, Antalgic       General Gait Details: Overall very shaky with shuffling gait and difficulty placing weight on RLE during stance phase leading to occasional buckling of RLE.  Stairs            Wheelchair Mobility    Modified Rankin (Stroke Patients Only)       Balance Overall balance assessment: Needs assistance Sitting-balance support: Bilateral upper extremity supported, Feet supported Sitting balance-Leahy Scale: Fair     Standing balance support: During functional activity, Reliant on assistive device for balance Standing balance-Leahy Scale: Poor Standing balance comment: heavy UE reliance on RW, buckling of RLE on occasion                             Pertinent Vitals/Pain Pain Assessment Pain Assessment: Faces Faces Pain Scale: Hurts little more Pain Descriptors / Indicators: Grimacing, Guarding Pain Intervention(s): Limited activity within patient's tolerance, Monitored during session, Repositioned, Premedicated before session    [place] expects to be discharged to:: Private residence Living Arrangements: Spouse/significant other Available Help at Discharge: Family;Available 24 hours/day Type of Home: House (town  home) Home Access: Ramped entrance;Stairs to enter Entrance Stairs-Rails: None Entrance Stairs-Number of Steps: 1 small step from garage port   Home Layout: One level Home Equipment: Conservation officer, nature (2 wheels);Rollator (4 wheels);Cane - single point;Shower seat - built in;Grab bars - tub/shower;Wheelchair - manual      Prior Function Prior Level of Function : Independent/Modified Independent             Mobility Comments: Furniture walker in home, use of SPC in community       Hand Dominance        Extremity/Trunk Assessment   Upper Extremity Assessment Upper Extremity Assessment: Defer to OT evaluation    Lower Extremity Assessment Lower Extremity Assessment: Generalized weakness;RLE deficits/detail RLE Deficits / Details: R post hip precuatinos s/p hemiarthroplasty       Communication   Communication: No difficulties  Cognition Arousal/Alertness: Awake/alert Behavior During Therapy: WFL for tasks assessed/performed Overall Cognitive Status: Within Functional Limits for tasks assessed                                          General Comments General comments (skin integrity, edema, etc.): SPO2 on RA at 83-85%. Relies on 3L/min via [place] to maintain >90% at rest and with mobility.    Exercises Total Joint Exercises Ankle Circles/Pumps: AROM, Strengthening, Both, 15 reps, Seated Gluteal Sets: AROM, Strengthening, Both, 10 reps, Seated Towel Squeeze:  AROM, Strengthening, Both, 10 reps, Seated (ensured hips in neutral alignment) Long Arc Quad: AROM, Strengthening, Both, 10 reps, Seated Other Exercises Other Exercises: Role of PT in acute setting, WB precautions, hip precautions, safe use of DME, LE therex   Assessment/Plan    PT Assessment Patient needs continued PT services  PT Problem List Decreased strength;Decreased mobility;Decreased safety awareness;Decreased range of motion;Decreased knowledge of precautions;Decreased activity  tolerance;Decreased balance;Cardiopulmonary status limiting activity;Pain;Decreased knowledge of use of DME       PT Treatment Interventions DME instruction;Therapeutic exercise;Gait training;Balance training;Stair training;Neuromuscular re-education;Functional mobility training;Therapeutic activities;Patient/family education    PT Goals (Current goals can be found in the Care Plan section)  Acute Rehab PT Goals Patient Stated Goal: improve pain and mobility PT Goal Formulation: With patient Time For Goal Achievement: 06/25/22 Potential to Achieve Goals: Good    Frequency 7X/week     Co-evaluation               AM-PAC PT "6 Clicks" Mobility  Outcome Measure Help needed turning from your back to your side while in a flat bed without using bedrails?: A Little Help needed moving from lying on your back to sitting on the side of a flat bed without using bedrails?: A Lot Help needed moving to and from a bed to a chair (including a wheelchair)?: A Lot Help needed standing up from a chair using your arms (e.g., wheelchair or bedside chair)?: A Lot Help needed to walk in hospital room?: A Lot Help needed climbing 3-5 steps with a railing? : Total 6 Click Score: 12    End of Session Equipment Utilized During Treatment: Gait belt;Oxygen Activity Tolerance: Patient tolerated treatment well;Patient limited by fatigue Patient left: in chair;with call bell/phone within reach;with chair alarm set (in care of OT) Nurse Communication: Mobility status PT Visit Diagnosis: Unsteadiness on feet (R26.81);Other abnormalities of gait and mobility (R26.89);Muscle weakness (generalized) (M62.81);Difficulty in walking, not elsewhere classified (R26.2);Pain Pain - Right/Left: Right Pain - part of body: Hip    Time: 2122-4825 PT Time Calculation (min) (ACUTE ONLY): 27 min   Charges:   PT Evaluation $PT Eval Moderate Complexity: 1 Mod PT Treatments $Therapeutic Exercise: 8-22 mins        Ashraf Mesta M. Fairly IV, PT, DPT Physical Therapist- [facility]  06/11/2022, 10:13 AM

## 2022-06-11 NOTE — Progress Notes (Signed)
Subjective: 1 Day Post-Op Procedure(s) (LRB): ARTHROPLASTY BIPOLAR HIP (HEMIARTHROPLASTY) (Right) Patient reports pain as mild.   Patient is well, and has had no acute complaints or problems PT and care management to assist with discharge planning. Negative for chest pain and shortness of breath Fever: no Gastrointestinal:Negative for nausea and vomiting.  Reports that he is passing gas this morning.  Objective: Vital signs in last 24 hours: Temp:  [96.8 F (36 C)-99.1 F (37.3 C)] 98.8 F (37.1 C) (08/04 0719) Pulse Rate:  [65-101] 66 (08/04 0719) Resp:  [9-20] 16 (08/04 0719) BP: (101-140)/(63-79) 113/67 (08/04 0719) SpO2:  [86 %-98 %] 98 % (08/04 0719) Weight:  [95.2 kg] 95.2 kg (08/03 1231)  Intake/Output from previous day:  Intake/Output Summary (Last 24 hours) at 06/11/2022 0733 Last data filed at 06/11/2022 0653 Gross per 24 hour  Intake 1266.58 ml  Output 650 ml  Net 616.58 ml    Intake/Output this shift: No intake/output data recorded.  Labs: Recent Labs    06/10/22 0001 06/11/22 0420  HGB 11.5* 9.5*   Recent Labs    06/10/22 0001 06/11/22 0420  WBC 6.6 11.4*  RBC 3.98* 3.25*  HCT 36.9* 30.4*  PLT 167 139*   Recent Labs    06/10/22 0243 06/11/22 0420  NA 140 139  K 4.4 5.3*  CL 109 110  CO2 21* 23  BUN 31* 38*  CREATININE 2.76* 3.01*  GLUCOSE 124* 156*  CALCIUM 8.9 8.2*   Recent Labs    06/10/22 0001  INR 1.0     EXAM General - Patient is Alert, Appropriate, and Oriented Extremity - ABD soft Neurovascular intact Dorsiflexion/Plantar flexion intact Incision: dressing C/D/I No cellulitis present Dressing/Incision - clean, dry, no drainage noted to the right leg honeycomb dressing. Motor Function - intact, moving foot and toes well on exam.  Abdomen soft with intact bowel sounds this morning.  Past Medical History:  Diagnosis Date   AAA (abdominal aortic aneurysm) ([place])    a.) s/p EVAR 01/05/2017. b.) native aneurysm sac 6.6 x  6.9 cm by CT on 03/31/2021   Abnormality of tongue    a.) CT head/neck 07/10/2021 --> asymmetric soft tissue at the RIGHT tongue base with a superficial 8 mm lesion.   Anemia    Aneurysm of right common iliac artery (HCC)    a.) measured 2.7 cm by CT on 03/31/2021   Anxiety    Aortic atherosclerosis (HCC)    Atrophic kidney    B12 deficiency    CAD (coronary artery disease)    Cervical radiculopathy    Chronic airway obstruction (HCC)    Chronic kidney disease (CKD), stage III (moderate) (HCC)    Chronic pain syndrome 06/16/2021   Chronic right shoulder pain 11/12/2019   Chronic tension headaches    Chronic, continuous use of opioids 06/16/2021   Coronary artery disease    DDD (degenerative disc disease), lumbar    Degenerative disc disease, lumbar    with lumbar radiculopathy   Elbow fracture, left    GERD (gastroesophageal reflux disease)    H/O adenomatous polyp of colon    H/O hemorrhoids    HTN (hypertension)    Hyperlipidemia    Meralgia paresthetica    Mild cognitive impairment    Nephrolithiasis    Neuralgia    Numbness of right foot 02/19/2021   Osteoarthritis    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   Presence of permanent cardiac pacemaker  Prostate cancer [facility])    a.) s/p prostatectomy   S/P CABG x 3 05/01/2004   a.) LVEF 40-49%; LIMA-LAD, SVG-OM1, SVG-PDA   Second degree AV block    Sinoatrial node dysfunction (HCC)    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   Weight as of this encounter: 95.2 kg. Advance diet Up with therapy D/C IV fluids when tolerating po intake.  Labs reviewed, Hg 9.5.  K+ 5.3, will rehcheck potassium this morning. WBC 11.4.   Up with therapy today.  Will likely need SNF on discharge, has been to [place] before and prefer that option. Patient is passing gas, begin working on BM.  DVT Prophylaxis -  Eliquis, SCDs Weight-Bearing as tolerated to right leg  J. Cameron Proud, PA-C [facility] Orthopaedic Surgery 06/11/2022, 7:33 AM

## 2022-06-11 NOTE — Evaluation (Signed)
Occupational Therapy Evaluation Patient Details Name: Rick Mcbride. 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 PTA pt was MOD I-I in ADL, furniture walks at home, [facility] in community. Pt is with a falls history. Pt requires MIN A +2 for STS, step pivot transfer to bedside chair with MIN A +2. Pt requires MAX A for LB dressing, SET UP required for grooming tasks. Pt is performing ADL/IADL below PLOF, will benefit from STR to address functional deficits. Pt is left in bedside chair, NAD, all needs met. OT will continue to follow acutely.      Recommendations for follow up therapy are one component of a multi-disciplinary discharge planning process, led by the attending physician.  Recommendations may be updated based on patient status, additional functional criteria and insurance authorization.   Follow Up Recommendations  Skilled nursing-short term rehab (<3 hours/day)    Assistance Recommended at Discharge Frequent or constant Supervision/Assistance  Patient can return home with the following A lot of help with bathing/dressing/bathroom;A lot of help with walking and/or transfers    Functional Status Assessment  Patient has had a recent decline in their functional status and demonstrates the ability to make significant improvements in function in a reasonable and predictable amount of time.  Equipment Recommendations  Other (comment) (per next venue of care)    Recommendations for  Other Services       Precautions / Restrictions Precautions Precautions: Posterior Hip Precaution Booklet Issued: Yes (comment) Precaution Comments: R hemi, posterior precautions Restrictions Weight Bearing Restrictions: Yes RLE Weight Bearing: Weight bearing as tolerated      Mobility Bed Mobility               General bed mobility comments: NT pt on edge of bed prior to session, in chair post session    Transfers Overall transfer level: Needs assistance Equipment used: Rolling walker (2 wheels) Transfers: Sit to/from Stand Sit to Stand: Min assist, +2 physical assistance                  Balance Overall balance assessment: Needs assistance Sitting-balance support: Bilateral upper extremity supported, Feet supported Sitting balance-Leahy Scale: Fair     Standing balance support: During functional activity, Reliant on assistive device for balance Standing balance-Leahy Scale: Poor                             ADL either performed or assessed with clinical judgement   ADL Overall ADL's : Needs assistance/impaired     Grooming: Set up;Sitting               Lower Body Dressing: Maximal assistance   Toilet Transfer: Minimal assistance;+2 for physical assistance Toilet Transfer Details (indicate cue type and reason): step pivot to bedside chair; simulated Toileting- Clothing Manipulation and Hygiene: Maximal assistance;Sit to/from stand               Vision Patient Visual Report: No change from  baseline       Perception     Praxis      Pertinent Vitals/Pain Pain Assessment Pain Assessment: Faces Faces Pain Scale: Hurts little more Pain Location: R hip Pain Descriptors / Indicators: Grimacing, Guarding Pain Intervention(s): Limited activity within patient's tolerance, Monitored during session, Repositioned, Premedicated before session, Ice applied     Hand Dominance     Extremity/Trunk Assessment Upper Extremity  Assessment Upper Extremity Assessment: Generalized weakness   Lower Extremity Assessment Lower Extremity Assessment: Generalized weakness;RLE deficits/detail RLE Deficits / Details: R hemi arthoplasty       Communication Communication Communication: No difficulties   Cognition Arousal/Alertness: Awake/alert Behavior During Therapy: WFL for tasks assessed/performed Overall Cognitive Status: Within Functional Limits for tasks assessed                                       General Comments  SPO2 on RA at 83-85%. Relies on 3L/min via [place] to maintain >90% at rest and with mobility.    Exercises     Shoulder Instructions      Home Living Family/patient expects to be discharged to:: Private residence Living Arrangements: Spouse/significant other Available Help at Discharge: Family;Available 24 hours/day Type of Home: House (townhouse) Home Access: Ramped entrance;Stairs to enter [employer] of Steps: 1 small step from garage port Entrance Stairs-Rails: None Home Layout: One level     Bathroom Shower/Tub: Occupational psychologist: Handicapped height Bathroom Accessibility: Yes   Home Equipment: Conservation officer, nature (2 wheels);Rollator (4 wheels);Cane - single point;Shower seat - built in;Grab bars - tub/shower;Wheelchair - manual          Prior Functioning/Environment Prior Level of Function : Independent/Modified Independent             Mobility Comments: no AD in home- furnature walks, use of SPC in communtiy ADLs Comments: MOD I-I in ADL; wife does cooking/cleaning        OT Problem List: Decreased strength;Decreased activity tolerance;Impaired balance (sitting and/or standing);Decreased knowledge of use of DME or AE;Decreased safety awareness      OT Treatment/Interventions: Self-care/ADL training;Patient/family education;Therapeutic exercise;DME and/or AE instruction;Balance training;Therapeutic activities    OT Goals(Current  goals can be found in the care plan section) Acute Rehab OT Goals Patient Stated Goal: get stronger OT Goal Formulation: With patient Time For Goal Achievement: 06/25/22 Potential to Achieve Goals: Good ADL Goals Pt Will Perform Grooming: with modified independence;sitting;standing Pt Will Perform Lower Body Dressing: with min assist;sit to/from stand;with adaptive equipment Pt Will Transfer to Toilet: ambulating;with min guard assist Pt Will Perform Toileting - Clothing Manipulation and hygiene: with modified independence;sit to/from stand Pt Will Perform Tub/Shower Transfer: with modified independence  OT Frequency: Min 2X/week    Co-evaluation PT/OT/SLP Co-Evaluation/Treatment: Yes     OT goals addressed during session: ADL's and self-care      AM-PAC OT "6 Clicks" Daily Activity     Outcome Measure Help from another person eating meals?: None Help from another person taking care of personal grooming?: None Help from another person toileting, which includes using toliet, bedpan, or urinal?: A Lot Help from another person bathing (including washing, rinsing, drying)?: A Lot Help from another person to put on and taking off regular upper body clothing?: A Little Help from another person to put on and taking off regular lower body clothing?: A Lot 6 Click Score: 17   End of  Session Equipment Utilized During Treatment: Gait belt;Rolling walker (2 wheels);Oxygen Nurse Communication: Mobility status  Activity Tolerance: Patient tolerated treatment well Patient left: in chair;with call bell/phone within reach;with chair alarm set;with nursing/sitter in room  OT Visit Diagnosis: Unsteadiness on feet (R26.81);Muscle weakness (generalized) (M62.81)                Time: 5694-3700 OT Time Calculation (min): 23 min Charges:  OT General Charges $OT Visit: 1 Visit OT Evaluation $OT Eval Moderate Complexity: 1 Mod  Shanon Payor, OTD OTR/L  06/11/22, 1:06 PM

## 2022-06-11 NOTE — Anesthesia Postprocedure Evaluation (Signed)
Anesthesia Post Note  Patient: Rick Mcbride.  Procedure(s) Performed: ARTHROPLASTY BIPOLAR HIP (HEMIARTHROPLASTY) (Right: Hip)  Patient location during evaluation: PACU Anesthesia Type: General Level of consciousness: awake and alert Pain management: pain level controlled Vital Signs Assessment: post-procedure vital signs reviewed and stable Respiratory status: spontaneous breathing, nonlabored ventilation and respiratory function stable Cardiovascular status: blood pressure returned to baseline and stable Postop Assessment: no apparent nausea or vomiting Anesthetic complications: no   No notable events documented.   Last Vitals:  Vitals:   06/11/22 0537 06/11/22 0719  BP: 123/74 113/67  Pulse: 65 66  Resp: 17 16  Temp: (!) 36.4 C 37.1 C  SpO2: 98% 98%    Last Pain:  Vitals:   06/10/22 2209  TempSrc:   PainSc: 0-No pain                 [place] Ouch

## 2022-06-11 NOTE — TOC Benefit Eligibility Note (Signed)
Patient Teacher, English as a foreign language completed.    The patient is currently admitted and upon discharge could be taking Eliquis 2.5 mg.  The current 30 day co-pay is $45.00.   The patient is insured through [place], [place] Patient Advocate Specialist [place] Patient Advocate Team Direct Number: [phone]  Fax: [phone]

## 2022-06-11 NOTE — Plan of Care (Signed)

## 2022-06-11 NOTE — Telephone Encounter (Signed)
Pharmacy Patient Advocate Encounter  Insurance verification completed.    The patient is insured through [employer] Part D   The patient is currently admitted and ran test claims for the following: Eliquis.  Copays and coinsurance results were relayed to Inpatient clinical team.

## 2022-06-11 NOTE — Assessment & Plan Note (Addendum)
Hemoglobin initially 11.5, but that was in the setting of acute kidney injuries, so likely lower than this due to hemoconcentration.  Following expected blood loss anemia from surgery as well as hydration, hemoglobin down as low as 8.3 and has since stabilized.

## 2022-06-11 NOTE — NC FL2 (Signed)
[place] LEVEL OF CARE SCREENING TOOL     IDENTIFICATION  Patient Name: Rick Mcbride. Birthdate: [birth year] Sex: male Admission Date (Current Location): 06/09/2022  [facility] and [place] Number:  Engineering geologist and Address:         Provider Number: [id number]  Attending Physician Name and Address:  Annita Brod, MD  Relative Name and Phone Number:  Rick Mcbride [facility])   [phone] (Home Phone)    Current Level of Care: Hospital Recommended Level of Care: [place] Prior Approval Number:    Date Approved/Denied:   PASRR Number: [id number]  Discharge Plan:      Current Diagnoses: Patient Active Problem List   Diagnosis Date Noted   Acute postoperative anemia due to expected blood loss 06/11/2022   Fracture of femoral neck, right, closed ([place]) 06/10/2022   Overweight (BMI 25.0-29.9) 06/10/2022   Presence of permanent cardiac pacemaker    Mild cognitive impairment    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[place]) 08/28/2021   Chronic pain syndrome 06/16/2021   Numbness of right foot 02/19/2021   Aortic atherosclerosis ([place]) 10/15/2020   Rotator cuff tendinitis, right 01/25/2020   Status post right shoulder hemiarthroplasty 01/25/2020   Anemia in chronic kidney disease 11/29/2019   Benign hypertensive kidney disease with chronic kidney disease 11/29/2019   Chronic kidney disease, stage IV (severe) ([place]) 11/29/2019   Hematuria 11/29/2019   Proteinuria 11/29/2019   Secondary hyperparathyroidism of renal origin ([place]) 11/29/2019   Chronic right shoulder pain 11/12/2019   Thoracic aortic aneurysm without rupture ([place]) 10/09/2019   Weakness 06/07/2017   Olecranon fracture, left, closed, initial encounter 05/27/2017   Spondylolisthesis of lumbosacral region 03/16/2017   Low back pain 02/01/2017   Escherichia coli (E. coli) infection 01/28/2017   Acute renal failure superimposed on stage 4 chronic kidney disease  ([place]) 01/28/2017   Elevated troponin 01/28/2017   Generalized weakness 01/28/2017   Essential hypertension 01/28/2017   UTI (urinary tract infection) 01/26/2017   AAA (abdominal aortic aneurysm) without rupture ([place]) 11/23/2016   Chronic obstructive pulmonary disease ([place]) 10/09/2015   Personal history of diseases of skin or subcutaneous tissue 10/09/2015   Malignant neoplasm of prostate ([place]) 10/09/2015   Pure hypercholesterolemia 10/09/2015   Sinoatrial node dysfunction ([place]) 10/09/2015   History of surgical procedure 08/22/2015   Status post total shoulder replacement 07/10/2015   Arthritis of shoulder region, degenerative 06/10/2015   Benign essential HTN 06/03/2015   Absolute anemia 04/12/2015   Atrophic kidney 04/12/2015   Essential (primary) hypertension 04/12/2015   Acid reflux 04/12/2015   Cancer of lung ([place]) 04/12/2015   CA of prostate ([place]) 04/12/2015   H/O adenomatous polyp of colon 01/15/2015   Temporary cerebral vascular dysfunction 11/21/2014   Calcific shoulder tendinitis 08/01/2014   Cervical nerve root disorder 04/07/2012   CAD S/P CABG x 3 05/01/2004    Orientation RESPIRATION BLADDER Height & Weight     Self, Time, Situation, Place  Normal Incontinent Weight: 209 lb 14.1 oz (95.2 kg) Height:  5\' 11"  (180.3 cm)  BEHAVIORAL SYMPTOMS/MOOD NEUROLOGICAL BOWEL NUTRITION STATUS        Diet (heart healthy/carb modified)  AMBULATORY STATUS COMMUNICATION OF NEEDS Skin   Limited Assist Verbally Skin abrasions, Surgical wounds (R hip)                       Personal Care Assistance Level of Assistance  Bathing, Dressing, Feeding Bathing Assistance: Limited assistance  Feeding assistance: Independent Dressing Assistance: Limited assistance     Functional Limitations Info             SPECIAL CARE FACTORS FREQUENCY  PT (By licensed PT), OT (By licensed OT)     PT Frequency: 5 times per week OT Frequency: 5 times per week            Contractures       Additional Factors Info  Code Status, Allergies Code Status Info: full Allergies Info: nka           Current Medications (06/11/2022):  This is the current hospital active medication list Current Facility-Administered Medications  Medication Dose Route Frequency Provider Last Rate Last Admin   0.9 %  sodium chloride infusion   Intravenous Continuous Annita Brod, MD 100 mL/hr at 06/11/22 0841 1,000 mL at 06/11/22 0841   acidophilus (RISAQUAD) capsule 1 capsule  1 capsule Oral Daily Poggi, Marshall Cork, MD   1 capsule at 06/11/22 0867   apixaban (ELIQUIS) tablet 2.5 mg  2.5 mg Oral BID [name], MD   2.5 mg at 06/11/22 6195   atorvastatin (LIPITOR) tablet 40 mg  40 mg Oral QHS [name], MD   40 mg at 06/10/22 2139   bisacodyl (DULCOLAX) suppository 10 mg  10 mg Rectal Daily PRN Poggi, Marshall Cork, MD       calcitRIOL (ROCALTROL) capsule 0.25 mcg  0.25 mcg Oral Daily Poggi, Marshall Cork, MD   0.25 mcg at 06/11/22 0932   calcium-vitamin D (OSCAL WITH D) 500-5 MG-MCG per tablet 1 tablet  1 tablet Oral Daily Poggi, Marshall Cork, MD   1 tablet at 06/11/22 6712   diphenhydrAMINE (BENADRYL) 12.5 MG/5ML elixir 12.5-25 mg  12.5-25 mg Oral Q4H PRN Poggi, Marshall Cork, MD       docusate sodium (COLACE) capsule 100 mg  100 mg Oral BID [name], MD   100 mg at 06/11/22 0853   donepezil (ARICEPT) tablet 10 mg  10 mg Oral QHS Poggi, Marshall Cork, MD   10 mg at 06/10/22 2139   ezetimibe (ZETIA) tablet 10 mg  10 mg Oral Daily Poggi, Marshall Cork, MD   10 mg at 06/11/22 0853   feeding supplement (ENSURE ENLIVE / ENSURE PLUS) liquid 237 mL  237 mL Oral BID BM Annita Brod, MD       ferrous sulfate tablet 325 mg  325 mg Oral BID [name], MD   325 mg at 06/11/22 4580   HYDROcodone-acetaminophen (NORCO/VICODIN) 5-325 MG per tablet 1-2 tablet  1-2 tablet Oral Q6H PRN Poggi, Marshall Cork, MD   1 tablet at 06/11/22 9983   magnesium hydroxide (MILK OF MAGNESIA) suspension 30 mL  30 mL Oral Daily PRN [name], MD   30 mL  at 06/11/22 0840   melatonin tablet 10 mg  10 mg Oral QHS Poggi, Marshall Cork, MD   10 mg at 06/10/22 2140   metoCLOPramide (REGLAN) tablet 5-10 mg  5-10 mg Oral Q8H PRN Poggi, Marshall Cork, MD       Or   metoCLOPramide (REGLAN) injection 5-10 mg  5-10 mg Intravenous Q8H PRN Poggi, Marshall Cork, MD       metoprolol succinate (TOPROL-XL) 24 hr tablet 25 mg  25 mg Oral Daily Poggi, Marshall Cork, MD   25 mg at 06/11/22 0839   morphine (PF) 2 MG/ML injection 2 mg  2 mg Intravenous Q2H PRN Poggi, Marshall Cork, MD  2 mg at 06/10/22 2139   multivitamin with minerals tablet 1 tablet  1 tablet Oral Daily Annita Brod, MD   1 tablet at 06/11/22 4949   naphazoline-glycerin (CLEAR EYES REDNESS) ophth solution 1-2 drop  1-2 drop Right Eye QID PRN Annita Brod, MD   2 drop at 06/10/22 2016   ondansetron (ZOFRAN) tablet 4 mg  4 mg Oral Q6H PRN Poggi, Marshall Cork, MD       Or   ondansetron (ZOFRAN) injection 4 mg  4 mg Intravenous Q6H PRN Poggi, Marshall Cork, MD       pantoprazole (PROTONIX) EC tablet 40 mg  40 mg Oral Daily Poggi, Marshall Cork, MD   40 mg at 06/11/22 0839   sodium phosphate (FLEET) 7-19 GM/118ML enema 1 enema  1 enema Rectal Once PRN Poggi, Marshall Cork, MD         Discharge Medications: Please see discharge summary for a list of discharge medications.  Relevant Imaging Results:  Relevant Lab Results:   Additional Information SS #: [place], LCSW

## 2022-06-11 NOTE — Progress Notes (Signed)
[facility]  ROUNDING NOTE   Subjective:   Rick T Falcon Mccaskey. is a 81 year old male with past medical history including lung cancer, CAD with CABG, CVA, hypertension, and chronic kidney disease stage IV.  Patient presents to the emergency department via EMS after sustaining a fall at home.  Patient has been admitted for Fracture of femoral neck, right, closed ([place]) [S72.001A] Fall, initial encounter [W19.XXXA] Laceration of scalp, initial encounter [S01.01XA] Closed displaced fracture of right femoral neck (H[facility]) [S72.001A] Chronic kidney disease, unspecified CKD stage [N18.9]  Patient is known to our practice and receives outpatient follow-up by Dr. Juleen China.    Patient sitting up in chair Alert Denies pain from procedure Tolerating meals    Objective:  Vital signs in last 24 hours:  Temp:  [96.8 F (36 C)-98.8 F (37.1 C)] 98.7 F (37.1 C) (08/04 1105) Pulse Rate:  [65-101] 74 (08/04 1105) Resp:  [9-20] 16 (08/04 1105) BP: (101-123)/(63-79) 118/72 (08/04 1105) SpO2:  [86 %-99 %] 99 % (08/04 1105)  Weight change: 0 kg Filed Weights   06/10/22 0000 06/10/22 1231  Weight: 95.2 kg 95.2 kg    Intake/Output: I/O last 3 completed shifts: In: 1266.6 [P.O.:60; I.V.:1041.6; IV Piggyback:165] Out: 650 [Urine:500; Blood:150]   Intake/Output this shift:  No intake/output data recorded.  Physical Exam: General: NAD, sitting up in chair  Head: Normocephalic, atraumatic. Moist oral mucosal membranes  Eyes: Anicteric  Lungs:  Clear to auscultation, normal effort, room air  Heart: Regular rate and rhythm  Abdomen:  Soft, nontender, nondistended  Extremities: Trace peripheral edema.  Neurologic: Nonfocal, moving all four extremities  Skin: No lesions  Access: None    Basic Metabolic Panel: Recent Labs  Lab 06/10/22 0001 06/10/22 0243 06/11/22 0420 06/11/22 0748  NA 142 140 139  --   K 4.7 4.4 5.3* 4.9  CL 109 109 110  --   CO2 25 21* 23  --   GLUCOSE  123* 124* 156*  --   BUN 32* 31* 38*  --   CREATININE 2.96* 2.76* 3.01*  --   CALCIUM 9.3 8.9 8.2*  --      Liver Function Tests: Recent Labs  Lab 06/10/22 0001  AST 19  ALT 20  ALKPHOS 68  BILITOT 0.4  PROT 6.4*  ALBUMIN 3.7    No results for input(s): "LIPASE", "AMYLASE" in the last 168 hours. No results for input(s): "AMMONIA" in the last 168 hours.  CBC: Recent Labs  Lab 06/10/22 0001 06/11/22 0420  WBC 6.6 11.4*  NEUTROABS 5.0  --   HGB 11.5* 9.5*  HCT 36.9* 30.4*  MCV 92.7 93.5  PLT 167 139*     Cardiac Enzymes: No results for input(s): "CKTOTAL", "CKMB", "CKMBINDEX", "TROPONINI" in the last 168 hours.  BNP: Invalid input(s): "POCBNP"  CBG: No results for input(s): "GLUCAP" in the last 168 hours.  Microbiology: Results for orders placed or performed during the hospital encounter of 06/09/22  Surgical PCR screen     Status: None   Collection Time: 06/10/22  8:15 AM   Specimen: Nasal Mucosa; Nasal Swab  Result Value Ref Range Status   MRSA, PCR NEGATIVE NEGATIVE Final   Staphylococcus aureus NEGATIVE NEGATIVE Final    Comment: (NOTE) The Xpert SA Assay (FDA approved for NASAL specimens in patients 3 years of age and older), is one component of a comprehensive surveillance program. It is not intended to diagnose infection nor to guide or monitor treatment. Performed at [facility], [phone]  [address] [place], [place] [address]     Coagulation Studies: Recent Labs    06/10/22 0001  LABPROT 13.4  INR 1.0     Urinalysis: No results for input(s): "COLORURINE", "LABSPEC", "PHURINE", "GLUCOSEU", "HGBUR", "BILIRUBINUR", "KETONESUR", "PROTEINUR", "UROBILINOGEN", "NITRITE", "LEUKOCYTESUR" in the last 72 hours.  Invalid input(s): "APPERANCEUR"    Imaging: DG HIP UNILAT W OR W/O PELVIS 2-3 VIEWS RIGHT  Result Date: 06/10/2022 CLINICAL DATA:  Status post right hip hemiarthroplasty. EXAM: DG HIP (WITH OR WITHOUT PELVIS) 2-3V RIGHT  COMPARISON:  Pelvis and right hip radiographs 06/10/2022, earlier same day FINDINGS: Frontal view of the bilateral hips and lateral view of the right hip. Interval right hip hemiarthroplasty. No perihardware lucency is seen to indicate hardware failure or loosening. Expected postoperative changes including lateral hip subcutaneous air and surgical skin staples. The left femoroacetabular joint space is maintained. A right iliac vascular stent is again seen. Vascular coils also overlie the slightly more medial aspect of the right hemipelvis. L5-S1 posterior rod and screw fusion hardware. Vascular phleboliths are noted. IMPRESSION: Interval right hip hemiarthroplasty. No evidence of hardware failure. Electronically Signed   By: Yvonne Kendall M.D.   On: 06/10/2022 17:18   DG Chest 1 View  Result Date: 06/10/2022 CLINICAL DATA:  Fall with right hip fracture. EXAM: CHEST  1 VIEW COMPARISON:  CT 11/04/2020, radiograph 03/26/2017 FINDINGS: Prior median sternotomy. Left-sided pacemaker in place. Mild cardiomegaly. Stable mediastinal contours. Scarring at the right lung base. No acute consolidation. No pleural fluid or pneumothorax. No pulmonary edema. Bilateral proximal humeral arthroplasties. IMPRESSION: No acute abnormality. Electronically Signed   By: Keith Rake M.D.   On: 06/10/2022 00:58   DG Hip Unilat  With Pelvis 2-3 Views Right  Result Date: 06/10/2022 CLINICAL DATA:  Fall with right hip pain. EXAM: DG HIP (WITH OR WITHOUT PELVIS) 2-3V RIGHT COMPARISON:  None Available. FINDINGS: Displaced right femoral neck fracture with mild comminution. There is proximal migration of the femoral shaft. Femoral head remains seated. Pubic rami and remainder of the bony pelvis are intact. Multiple pelvic phleboliths. Iliac stents are partially included. Lumbosacral hardware. IMPRESSION: Displaced right femoral neck fracture. Electronically Signed   By: Keith Rake M.D.   On: 06/10/2022 00:56   CT Cervical Spine Wo  Contrast  Result Date: 06/10/2022 CLINICAL DATA:  Neck trauma (Age >= 65y) Ground level fall. EXAM: CT CERVICAL SPINE WITHOUT CONTRAST TECHNIQUE: Multidetector CT imaging of the cervical spine was performed without intravenous contrast. Multiplanar CT image reconstructions were also generated. RADIATION DOSE REDUCTION: This exam was performed according to the departmental dose-optimization program which includes automated exposure control, adjustment of the mA and/or kV according to patient size and/or use of iterative reconstruction technique. COMPARISON:  Soft tissue neck CT 07/10/2021 FINDINGS: Alignment: Straightening of normal lordosis. No traumatic subluxation. Skull base and vertebrae: No acute fracture. Vertebral body heights are maintained. The dens and skull base are intact. Soft tissues and spinal canal: No prevertebral fluid or swelling. No visible canal hematoma. Please note patient's known base of tongue lesion is not well assessed on the current exam. 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 Upper chest: Apical emphysema.  No acute findings Other: None. IMPRESSION: 1. No acute fracture or traumatic subluxation of the cervical spine. 2. 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 Electronically Signed   By: Keith Rake M.D.   On: 06/10/2022 00:44   CT HEAD WO CONTRAST (5MM)  Result Date: 06/10/2022 CLINICAL DATA:  Head  trauma, moderate-severe Ground level fall.  Head CT 03/04/2021 EXAM: CT HEAD WITHOUT CONTRAST TECHNIQUE: Contiguous axial images were obtained from the base of the skull through the vertex without intravenous contrast. RADIATION DOSE REDUCTION: This exam was performed according to the departmental dose-optimization program which includes automated exposure control, adjustment of the mA and/or kV according to patient size and/or use of iterative reconstruction technique. COMPARISON:  Head CT 03/04/2021 FINDINGS: Brain: No acute intracranial hemorrhage. Stable  degree of generalized atrophy with prominent periventricular chronic small vessel ischemia. Chronic inferior right frontal encephalomalacia. Remote right cerebellar infarct. Remote lacunar infarct in the left thalamus. No subdural or extra-axial collection. No midline shift or mass effect. Vascular: Atherosclerosis of skullbase vasculature without hyperdense vessel or abnormal calcification. Skull: No fracture or focal lesion. Sinuses/Orbits: Mucosal thickening of the left frontal sinus and scattered throughout ethmoid air cells. No mastoid effusion. Bilateral cataract resection. Other: Small left parietooccipital scalp hematoma. IMPRESSION: 1. Small left parietooccipital scalp hematoma. No acute intracranial abnormality. No skull fracture. 2. Stable atrophy, chronic small vessel ischemia, and remote infarcts. Electronically Signed   By: Keith Rake M.D.   On: 06/10/2022 00:37     Medications:    sodium chloride 1,000 mL (06/11/22 0841)    acidophilus  1 capsule Oral Daily   apixaban  2.5 mg Oral BID   atorvastatin  40 mg Oral QHS   calcitRIOL  0.25 mcg Oral Daily   calcium-vitamin D  1 tablet Oral Daily   docusate sodium  100 mg Oral BID   donepezil  10 mg Oral QHS   ezetimibe  10 mg Oral Daily   feeding supplement  237 mL Oral BID BM   ferrous sulfate  325 mg Oral BID   melatonin  10 mg Oral QHS   metoprolol succinate  25 mg Oral Daily   multivitamin with minerals  1 tablet Oral Daily   pantoprazole  40 mg Oral Daily   bisacodyl, diphenhydrAMINE, HYDROcodone-acetaminophen, magnesium hydroxide, metoCLOPramide **OR** metoCLOPramide (REGLAN) injection, morphine injection, naphazoline-glycerin, ondansetron **OR** ondansetron (ZOFRAN) IV, sodium phosphate  Assessment/ Plan:  [name]. is a 81 y.o.  male past medical history including lung cancer, CAD with CABG, CVA, hypertension, and chronic kidney disease stage IV.  Patient presents to the emergency department via EMS  after sustaining a fall at home.  Patient has been admitted for Fracture of femoral neck, right, closed ([place]) [S72.001A] Fall, initial encounter [W19.XXXA] Laceration of scalp, initial encounter [S01.01XA] Closed displaced fracture of right femoral neck ([place]) [S72.001A] Chronic kidney disease, unspecified CKD stage [N18.9]   Chronic kidney disease stage IV with baseline creatinine 2.66 and GFR 23 on 03/15/2022.  Chronic kidney disease secondary to chronic NSAID use, hypertension, and age.  Receives regular follow-up outpatient with our office.   Renal function slightly worse today after surgery. Will continue to monitor. Continue to avoid nephrotoxic agents and therapies.   Lab Results  Component Value Date   CREATININE 3.01 (H) 06/11/2022   CREATININE 2.76 (H) 06/10/2022   CREATININE 2.96 (H) 06/10/2022    Intake/Output Summary (Last 24 hours) at 06/11/2022 1408 Last data filed at 06/11/2022 0653 Gross per 24 hour  Intake 848.33 ml  Output 650 ml  Net 198.33 ml    2. Anemia of chronic kidney disease Lab Results  Component Value Date   HGB 9.5 (L) 06/11/2022    Hemoglobin within desired target.  We will continue to monitor  3.  Hypertension with chronic kidney disease.  Home regimen includes losartan and metoprolol.  Currently receiving these medications. Blood pressure 118/72  4. Secondary Hyperparathyroidism: with outpatient labs: PTH 170, phosphorus 4.3, calcium 9.5 on 03/15/2022.   Lab Results  Component Value Date   CALCIUM 8.2 (L) 06/11/2022  Patient receives calcitriol and cholecalciferol outpatient.  5.  Closed right femoral neck fracture, status post fall.  Orthopedic surgery consulted.  Right hip bipolar hemiarthroplasty on 06/10/22.  Pain control and physical therapy.    LOS: 1   8/4/20232:08 PM

## 2022-06-11 NOTE — TOC Initial Note (Signed)
Transition of Care (TOC) - Initial/Assessment Note    Patient Details  Name: Rick Mcbride. MRN: [id number] Date of Birth: 1941/03/16  Transition of Care [facility]) CM/SW Contact:    Magnus Ivan, LCSW Phone Number: 06/11/2022, 12:28 PM  Clinical Narrative:                 CSW spoke with patient regarding PT rec for SNF. Patient lives with his wife who drives him to appointments. PCP is Dr. Doy Hutching. Pharmacy is [facility]. Patient says he does not use DME at baseline.  [facility] went [facility] in the past and would like to return there for STR if possible. SNF workup started.   Expected Discharge Plan: Skilled Nursing Facility Barriers to Discharge: Continued Medical Work up   Patient Goals and CMS Choice Patient states their goals for this hospitalization and ongoing recovery are:: SNF CMS Medicare.gov Compare Post Acute Care list provided to:: Patient Choice offered to / list presented to : Patient  Expected Discharge Plan and Services Expected Discharge Plan: [place]       Living arrangements for the past 2 months: Single Family Home                                      Prior Living Arrangements/Services Living arrangements for the past 2 months: Single Family Home Lives with:: Spouse Patient language and need for interpreter reviewed:: Yes Do you feel safe going back to the place where you live?: Yes      Need for Family Participation in Patient Care: Yes (Comment) Care giver support system in place?: Yes (comment)   Criminal Activity/Legal Involvement Pertinent to Current Situation/Hospitalization: No - Comment as needed  Activities of Daily Living Home Assistive Devices/Equipment: None ADL Screening (condition at time of admission) Patient's cognitive ability adequate to safely complete daily activities?: Yes Is the patient deaf or have difficulty hearing?: No Does the patient have difficulty seeing, even when wearing glasses/contacts?:  No Does the patient have difficulty concentrating, remembering, or making decisions?: No Patient able to express need for assistance with ADLs?: Yes Does the patient have difficulty dressing or bathing?: No Independently performs ADLs?: Yes (appropriate for developmental age) Does the patient have difficulty walking or climbing stairs?: No Weakness of Legs: None Weakness of Arms/Hands: None  Permission Sought/Granted Permission sought to share information with : Facility Sport and exercise psychologist, Family Supports Permission granted to share information with : Yes, Verbal Permission Granted     Permission granted to share info w AGENCY: SNFs        Emotional Assessment       Orientation: : Oriented to Self, Oriented to Place, Oriented to  Time, Oriented to Situation Alcohol / Substance Use: Not Applicable Psych Involvement: No (comment)  Admission diagnosis:  Fracture of femoral neck, right, closed ([place]) [S72.001A] Fall, initial encounter [W19.XXXA] Laceration of scalp, initial encounter [S01.01XA] Closed displaced fracture of right femoral neck ([place]) [S72.001A] Chronic kidney disease, unspecified CKD stage [N18.9] Patient Active Problem List   Diagnosis Date Noted   Acute postoperative anemia due to expected blood loss 06/11/2022   Fracture of femoral neck, right, closed ([place]) 06/10/2022   Overweight (BMI 25.0-29.9) 06/10/2022   Presence of permanent cardiac pacemaker    Mild cognitive impairment    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[place]) 08/28/2021   Chronic pain syndrome 06/16/2021   Numbness  of right foot 02/19/2021   Aortic atherosclerosis ([place]) 10/15/2020   Rotator cuff tendinitis, right 01/25/2020   Status post right shoulder hemiarthroplasty 01/25/2020   Anemia in chronic kidney disease 11/29/2019   Benign hypertensive kidney disease with chronic kidney disease 11/29/2019   Chronic kidney disease, stage IV (severe) ([place]) 11/29/2019   Hematuria  11/29/2019   Proteinuria 11/29/2019   Secondary hyperparathyroidism of renal origin ([place]) 11/29/2019   Chronic right shoulder pain 11/12/2019   Thoracic aortic aneurysm without rupture ([place]) 10/09/2019   Weakness 06/07/2017   Olecranon fracture, left, closed, initial encounter 05/27/2017   Spondylolisthesis of lumbosacral region 03/16/2017   Low back pain 02/01/2017   Escherichia coli (E. coli) infection 01/28/2017   Acute renal failure superimposed on stage 4 chronic kidney disease ([place]) 01/28/2017   Elevated troponin 01/28/2017   Generalized weakness 01/28/2017   Essential hypertension 01/28/2017   UTI (urinary tract infection) 01/26/2017   AAA (abdominal aortic aneurysm) without rupture ([place]) 11/23/2016   Chronic obstructive pulmonary disease ([place]) 10/09/2015   Personal history of diseases of skin or subcutaneous tissue 10/09/2015   Malignant neoplasm of prostate ([place]) 10/09/2015   Pure hypercholesterolemia 10/09/2015   Sinoatrial node dysfunction ([place]) 10/09/2015   History of surgical procedure 08/22/2015   Status post total shoulder replacement 07/10/2015   Arthritis of shoulder region, degenerative 06/10/2015   Benign essential HTN 06/03/2015   Absolute anemia 04/12/2015   Atrophic kidney 04/12/2015   Essential (primary) hypertension 04/12/2015   Acid reflux 04/12/2015   Cancer of lung ([place]) 04/12/2015   CA of prostate ([place]) 04/12/2015   H/O adenomatous polyp of colon 01/15/2015   Temporary cerebral vascular dysfunction 11/21/2014   Calcific shoulder tendinitis 08/01/2014   Cervical nerve root disorder 04/07/2012   CAD S/P CABG x 3 05/01/2004   PCP:  Idelle Crouch, MD Pharmacy:   [place], [place] [address] [place] [address] Phone: [phone] Fax: [phone]     Social Determinants of Health (SDOH) Interventions    Readmission Risk Interventions     No data to display

## 2022-06-12 DIAGNOSIS — I10 Essential (primary) hypertension: Secondary | ICD-10-CM

## 2022-06-12 DIAGNOSIS — S72001A Fracture of unspecified part of neck of right femur, initial encounter for closed fracture: Secondary | ICD-10-CM | POA: Diagnosis not present

## 2022-06-12 DIAGNOSIS — N179 Acute kidney failure, unspecified: Secondary | ICD-10-CM | POA: Diagnosis not present

## 2022-06-12 DIAGNOSIS — D62 Acute posthemorrhagic anemia: Secondary | ICD-10-CM | POA: Diagnosis not present

## 2022-06-12 LAB — BASIC METABOLIC PANEL
Anion gap: 7 (ref 5–15)
BUN: 61 mg/dL — ABNORMAL HIGH (ref 8–23)
CO2: 22 mmol/L (ref 22–32)
Calcium: 8.3 mg/dL — ABNORMAL LOW (ref 8.9–10.3)
Chloride: 110 mmol/L (ref 98–111)
Creatinine, Ser: 2.78 mg/dL — ABNORMAL HIGH (ref 0.61–1.24)
GFR, Estimated: 22 mL/min — ABNORMAL LOW (ref >60)
Glucose, Bld: 125 mg/dL — ABNORMAL HIGH (ref 70–99)
Potassium: 5 mmol/L (ref 3.5–5.1)
Sodium: 139 mmol/L (ref 135–145)

## 2022-06-12 LAB — CBC
HCT: 26.7 % — ABNORMAL LOW (ref 39.0–52.0)
Hemoglobin: 8.3 g/dL — ABNORMAL LOW (ref 13.0–17.0)
MCH: 28.6 pg (ref 26.0–34.0)
MCHC: 31.1 g/dL (ref 30.0–36.0)
MCV: 92.1 fL (ref 80.0–100.0)
Platelets: 118 10*3/uL — ABNORMAL LOW (ref 150–400)
RBC: 2.9 MIL/uL — ABNORMAL LOW (ref 4.22–5.81)
RDW: 15.6 % — ABNORMAL HIGH (ref 11.5–15.5)
WBC: 9.1 10*3/uL (ref 4.0–10.5)
nRBC: 0 % (ref 0.0–0.2)

## 2022-06-12 MED ORDER — IPRATROPIUM BROMIDE 0.06 % NA SOLN
2.0000 | Freq: Two times a day (BID) | NASAL | Status: DC
  Filled 2022-06-12: qty 15

## 2022-06-12 MED ORDER — IPRATROPIUM BROMIDE 0.02 % IN SOLN
0.5000 mg | Freq: Four times a day (QID) | RESPIRATORY_TRACT | Status: DC
  Administered 2022-06-12: 0.5 mg via RESPIRATORY_TRACT
  Filled 2022-06-12: qty 2.5

## 2022-06-12 MED ORDER — TRAMADOL HCL 50 MG PO TABS
50.0000 mg | ORAL_TABLET | Freq: Four times a day (QID) | ORAL | Status: DC | PRN
  Administered 2022-06-12 – 2022-06-15 (×9): 50 mg via ORAL
  Filled 2022-06-12 (×9): qty 1

## 2022-06-12 MED ORDER — ACETAMINOPHEN 500 MG PO TABS
500.0000 mg | ORAL_TABLET | Freq: Four times a day (QID) | ORAL | Status: DC | PRN
  Administered 2022-06-12: 1000 mg via ORAL
  Administered 2022-06-12: 500 mg via ORAL
  Administered 2022-06-13 – 2022-06-15 (×2): 1000 mg via ORAL
  Filled 2022-06-12: qty 2
  Filled 2022-06-12: qty 1
  Filled 2022-06-12 (×4): qty 2

## 2022-06-12 MED ORDER — CLOPIDOGREL BISULFATE 75 MG PO TABS: 75.0000 mg | ORAL_TABLET | Freq: Every day | ORAL | 0 refills | Status: DC

## 2022-06-12 MED ORDER — IPRATROPIUM BROMIDE 0.06 % NA SOLN
2.0000 | Freq: Three times a day (TID) | NASAL | Status: DC
  Administered 2022-06-12 – 2022-06-15 (×8): 2 via NASAL
  Filled 2022-06-12: qty 15

## 2022-06-12 MED ORDER — APIXABAN 2.5 MG PO TABS: 2.5000 mg | ORAL_TABLET | Freq: Two times a day (BID) | ORAL | 0 refills | Status: DC

## 2022-06-12 MED ORDER — ZOLPIDEM TARTRATE 5 MG PO TABS
10.0000 mg | ORAL_TABLET | Freq: Every evening | ORAL | Status: DC | PRN
  Administered 2022-06-12 – 2022-06-13 (×2): 10 mg via ORAL
  Filled 2022-06-12 (×2): qty 2

## 2022-06-12 MED ORDER — ONDANSETRON HCL 4 MG PO TABS: 4.0000 mg | ORAL_TABLET | Freq: Four times a day (QID) | ORAL | 0 refills | Status: DC | PRN

## 2022-06-12 MED ORDER — HYDROCODONE-ACETAMINOPHEN 5-325 MG PO TABS
1.0000 | ORAL_TABLET | Freq: Four times a day (QID) | ORAL | 0 refills | Status: DC | PRN
Start: 2022-06-12 — End: 2022-06-14

## 2022-06-12 NOTE — Progress Notes (Signed)
[facility] Progress Note  Patient: Rick Mcbride.    XBD:[id number]  DOA: 06/09/2022    Date of Service: the patient was seen and examined on 06/12/2022  Brief hospital course: 81 year old male with past medical history of hypertension, lung cancer status post lobectomy, cancer of the base of the tongue status post radiation therapy which completed in February of this year, CAD status post CABG, stage IV chronic kidney disease, secondary AV block status post pacemaker, mild cognitive deficit who presented to the emergency room on the early morning of 8/3 after he sustained a accidental fall while going to the bathroom.  Patient fell hitting his head, but did not lose consciousness, but he was unable to stand.  The emergency room, patient found to have a creatinine of 2.96 (baseline of 2.03) and x-rays noted a displaced right femoral neck fracture.  Patient was admitted to the hospitalist service and orthopedics were consulted who plan to take patient to the operating room on afternoon of 8/3.  Postop, patient with blood loss anemia from surgery.  Continue to hydrate to get renal function back to baseline.  Assessment and Plan: Assessment and Plan: * Fracture of femoral neck, right, closed (HCC) Moderate risk given comorbidities, but overall stable for surgery.  Kept NPO.  Held aspirin and Plavix.  Morphine for pain control NPO.  Plan is for hip repair later today.  Some blood loss anemia, see below.  PT recommending short-term skilled nursing  Acute postoperative anemia due to expected blood loss Hemoglobin initially 11.5, but that was in the setting of acute kidney injuries, so likely lower than this due to hemoconcentration.  Following expected blood loss anemia from surgery as well as hydration, hemoglobin currently down to 8.3.  Renal function not yet back at baseline, so continue to hydrate and recheck labs in the morning.  Acute renal failure superimposed on stage 4 chronic kidney  disease [facility]) Nephrology consulted.  Creatinine 3-1/2 years ago at 2.03 with a GFR of 36.  Presented to the emergency room with creatinine of 2.96 and GFR of 21.  Patient will need more IV fluids given worsening renal failure secondary to blood loss.  See above.  Thoracic aortic aneurysm without rupture [facility]) AAA s/p endovascular repair Followed by vascular surgeon, Dr. Lucky Cowboy On Plavix, aspirin and atorvastatin Patient denies abdominal pain or chest pain Holding Plavix and aspirin for surgery  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[place]) Completed XRT in February 2023  Presence of permanent cardiac pacemaker Gets scheduled pacemaker checks.  No acute issues suspected  CAD S/P CABG x 3 Patient denies chest pain or shortness of breath.  Has good functional capacity Last seen by his cardiologist in March 2023 Currently on aspirin and atorvastatin and metoprolol   Cancer of lung ([place]) S/p lobectomy.  Follows with oncology  Mild cognitive impairment Continue Aricept Delirium precautions  Essential hypertension Blood pressure under fair control Continue losartan and metoprolol  Overweight (BMI 25.0-29.9) Meets criteria BMI greater than 25       Body mass index is 29.27 kg/m.  Nutrition Problem: Increased nutrient needs Etiology: post-op healing     Consultants: Orthopedic surgery Nephrology  Procedures: Status post hip fracture repair 8/3  Antimicrobials: Preop Ancef  Code Status: Full code   Subjective: Patient doing okay, minimal hip soreness.  Objective: Vital signs were reviewed and unremarkable. Vitals:   06/12/22 0426 06/12/22 0859  BP: (!) 158/69 (!) 165/65  Pulse: 66 79  Resp: 20  16  Temp: 98.8 F (37.1 C) 98.3 F (36.8 C)  SpO2: 96% 99%    Intake/Output Summary (Last 24 hours) at 06/12/2022 1000 Last data filed at 06/12/2022 0430 Gross per 24 hour  Intake 1945.39 ml  Output 650 ml  Net 1295.39 ml    Filed Weights   06/10/22  0000 06/10/22 1231  Weight: 95.2 kg 95.2 kg   Body mass index is 29.27 kg/m.  Exam:  General: Alert and oriented x2, no acute distress HEENT: Normocephalic, atraumatic, mucous membranes slightly dry Cardiovascular: Regular rate and rhythm, S1-S2 Respiratory: Decreased breath sounds throughout Abdomen: Soft, nontender, nondistended, positive bowel sounds Musculoskeletal: No clubbing or cyanosis or edema Skin: No skin breaks, tears or lesions Psychiatry: Appropriate, no evidence of psychoses Neurology: No focal deficits  Data Reviewed: Creatinine down to 2.78.  Hemoglobin down to 8.3.  Disposition:  Status is: Inpatient Remains inpatient appropriate because: Short-term skilled nursing placement  Anticipated discharge date: 8/7  Family Communication: Left message for family DVT Prophylaxis: apixaban (ELIQUIS) tablet 2.5 mg Start: 06/11/22 1000 SCDs Start: 06/10/22 1739 apixaban (ELIQUIS) tablet 2.5 mg    Author: Annita Brod ,MD 06/12/2022 10:00 AM  To reach On-call, see care teams to locate the attending and reach out via [email or web address]. Between 7PM-7AM, please contact night-coverage If you still have difficulty reaching the attending provider, please page the [facility] (Director on Call) for [facility] on amion for assistance.

## 2022-06-12 NOTE — TOC Progression Note (Signed)
Transition of Care (TOC) - Progression Note    Patient Details  Name: Rick Mcbride. MRN: [id number] Date of Birth: 1941/03/31  Transition of Care [facility]) CM/SW Contact  Zigmund Daniel Dorian Pod, RN Phone Number: 06/12/2022, 4:54 PM  Clinical Narrative:    Received a call back via [facility] ([phone]) with authorization# [id number] for SNF at [facility] the initial 7 days with 5 business days to completed the transfer to the facility.   Spoke with spouse [name] with the updated approval for placement to [facility]. Also spoke with Tammy ([place]) with plans for pt to come to the facility on Monday. TOC will reach out to [facility] when pt is medical stable on Monday for discharge. No other inquires or request at this time.  TOC will continue to follow for any additional needs.   Expected Discharge Plan: [place] Barriers to Discharge: Continued Medical Work up  Expected Discharge Plan and Services Expected Discharge Plan: [place] arrangements for the past 2 months: Single Family Home                                       Social Determinants of Health (SDOH) Interventions    Readmission Risk Interventions     No data to display

## 2022-06-12 NOTE — Progress Notes (Signed)
Subjective: 2 Days Post-Op Procedure(s) (LRB): ARTHROPLASTY BIPOLAR HIP (HEMIARTHROPLASTY) (Right) Patient reports pain as moderate.   Patient is well, and has had no acute complaints or problems Current plan is for discharge to SNF when insurance approves. Negative for chest pain and shortness of breath Fever: no Gastrointestinal:Negative for nausea and vomiting.  Reports that he is passing gas this morning.  Objective: Vital signs in last 24 hours: Temp:  [98.7 F (37.1 C)-99 F (37.2 C)] 98.8 F (37.1 C) (08/05 0426) Pulse Rate:  [66-86] 66 (08/05 0426) Resp:  [16-20] 20 (08/05 0426) BP: (111-158)/(64-72) 158/69 (08/05 0426) SpO2:  [93 %-99 %] 96 % (08/05 0426)  Intake/Output from previous day:  Intake/Output Summary (Last 24 hours) at 06/12/2022 0746 Last data filed at 06/12/2022 0430 Gross per 24 hour  Intake 1945.39 ml  Output 650 ml  Net 1295.39 ml    Intake/Output this shift: No intake/output data recorded.  Labs: Recent Labs    06/10/22 0001 06/11/22 0420 06/12/22 0456  HGB 11.5* 9.5* 8.3*   Recent Labs    06/11/22 0420 06/12/22 0456  WBC 11.4* 9.1  RBC 3.25* 2.90*  HCT 30.4* 26.7*  PLT 139* 118*   Recent Labs    06/11/22 0420 06/11/22 0748 06/12/22 0456  NA 139  --  139  K 5.3* 4.9 5.0  CL 110  --  110  CO2 23  --  22  BUN 38*  --  61*  CREATININE 3.01*  --  2.78*  GLUCOSE 156*  --  125*  CALCIUM 8.2*  --  8.3*   Recent Labs    06/10/22 0001  INR 1.0     EXAM General - Patient is Alert, Appropriate, and Oriented Extremity - ABD soft Neurovascular intact Dorsiflexion/Plantar flexion intact Incision: scant drainage No cellulitis present Dressing/Incision - Mild bloody drainage noted to the proximal aspect of the incision Motor Function - intact, moving foot and toes well on exam.  Abdomen soft with intact bowel sounds this morning.  Past Medical History:  Diagnosis Date   AAA (abdominal aortic aneurysm) ([place])    a.) s/p EVAR  01/05/2017. b.) native aneurysm sac 6.6 x 6.9 cm by CT on 03/31/2021   Abnormality of tongue    a.) CT head/neck 07/10/2021 --> asymmetric soft tissue at the RIGHT tongue base with a superficial 8 mm lesion.   Anemia    Aneurysm of right common iliac artery (HCC)    a.) measured 2.7 cm by CT on 03/31/2021   Anxiety    Aortic atherosclerosis (HCC)    Atrophic kidney    B12 deficiency    CAD (coronary artery disease)    Cervical radiculopathy    Chronic airway obstruction (HCC)    Chronic kidney disease (CKD), stage III (moderate) (HCC)    Chronic pain syndrome 06/16/2021   Chronic right shoulder pain 11/12/2019   Chronic tension headaches    Chronic, continuous use of opioids 06/16/2021   Coronary artery disease    DDD (degenerative disc disease), lumbar    Degenerative disc disease, lumbar    with lumbar radiculopathy   Elbow fracture, left    GERD (gastroesophageal reflux disease)    H/O adenomatous polyp of colon    H/O hemorrhoids    HTN (hypertension)    Hyperlipidemia    Meralgia paresthetica    Mild cognitive impairment    Nephrolithiasis    Neuralgia    Numbness of right foot 02/19/2021   Osteoarthritis    Pars  defect of lumbar spine    L5 bilat w/anteriolisthesis   Presence of permanent cardiac pacemaker    Prostate cancer [facility])    a.) s/p prostatectomy   S/P CABG x 3 05/01/2004   a.) LVEF 40-49%; LIMA-LAD, SVG-OM1, SVG-PDA   Second degree AV block    Sinoatrial node dysfunction (HCC)    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    Assessment/Plan: 2 Days Post-Op Procedure(s) (LRB): ARTHROPLASTY BIPOLAR HIP (HEMIARTHROPLASTY) (Right) Principal Problem:   Fracture  of femoral neck, right, closed ([place]) Active Problems:   Cancer of lung ([place])   Acute renal failure superimposed on stage 4 chronic kidney disease ([place])   Essential hypertension   Thoracic aortic aneurysm without rupture ([place])   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HCC)   Presence of permanent cardiac pacemaker   CAD S/P CABG x 3   Mild cognitive impairment   Overweight (BMI 25.0-29.9)   Acute postoperative anemia due to expected blood loss  Estimated body mass index is 29.27 kg/m as calculated from the following:   Height as of this encounter: 5\' 11"  (1.803 m).   Weight as of this encounter: 95.2 kg. Advance diet Up with therapy D/C IV fluids when tolerating po intake.  Labs reviewed, Hg 8.3 this morning, continue to monitor.  CBC ordered for tomorrow morning. WBC trending down to 9.1.  Hyperkalemia resolved. Up with therapy today.  Plan for discharge to [place], likely on Monday after insurance approves. Patient is passing gas, begin working on BM.  DVT Prophylaxis -  Eliquis, SCDs Weight-Bearing as tolerated to right leg  J. Cameron Proud, PA-C [facility] Orthopaedic Surgery 06/12/2022, 7:46 AM

## 2022-06-12 NOTE — TOC Progression Note (Signed)
Transition of Care (TOC) - Progression Note    Patient Details  Name: Rick Mcbride. MRN: [id number] Date of Birth: [birth year]  Transition of Care [facility]) CM/SW Contact  Zigmund Daniel Dorian Pod, RN Phone Number:[phone] 06/12/2022, 10:02 AM  Clinical Narrative:    Several beds offered with SNF. Spoke with pt and presented all offers for SNF. Pt again prefers [facility] and accepted the bed offered. RN further inquired on transportation to the SNF when medically stable for discharged. Pt agreed that he would be able to transport via private vehicle from [name]. Based upon the pt's response to the acceptance of bed offered from [place] contacted [facility]TA and spoke with Marlowe Kays who will began the authorization from this request with the potential discharged on Monday. Also contacted Tammy at [facility] to accept the bed that was offered. No other needs presented at this time.  [facility] Tammy [phone] HTA   Marlowe Kays  [phone] [name] )   [phone]  [facility] will remain available to assist with any other needs for discharge planning.   Expected Discharge Plan: [place] Barriers to Discharge: Continued Medical Work up  Expected Discharge Plan and Services Expected Discharge Plan: [place] arrangements for the past 2 months: Single Family Home                                       Social Determinants of Health (SDOH) Interventions    Readmission Risk Interventions     No data to display

## 2022-06-12 NOTE — Progress Notes (Signed)
Physical Therapy Treatment Patient Details Name: Rick Mcbride. 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  He showed good effort but lacked AROM for most R hip activity, though knee and ankle were functional.  Pt very hesitant to put a lot of weight on the R and during standing transition to recliner and the few feet we were able to "walk" he hardly tolerated any stance on the R and hardly managed to drag L foot forward minimally much less actually step on any of the attempts.  Great effort but functionally very limited.    Recommendations for follow up therapy are one component of a multi-disciplinary discharge planning process, led by the attending physician.  Recommendations may be updated based on patient status, additional functional criteria and insurance authorization.  Follow Up Recommendations  Skilled nursing-short term rehab (<3 hours/day) Can patient physically be transported by private vehicle: No   Assistance Recommended at Discharge Frequent or constant Supervision/Assistance  Patient can return home with the following A lot of help with bathing/dressing/bathroom;Two people to help with walking and/or transfers;Assistance with cooking/housework;Assist for transportation;Help with stairs or ramp for entrance   Equipment Recommendations  Other (comment) (TBD at rehab)    Recommendations for Other Services       Precautions / Restrictions  Precautions Precautions: Posterior Hip Restrictions Weight Bearing Restrictions: Yes RLE Weight Bearing: Weight bearing as tolerated     Mobility  Bed Mobility Overal bed mobility: Needs Assistance Bed Mobility: Supine to Sit     Supine to sit: Mod assist     General bed mobility comments: assist with b/l LEs as well as scooting hips    Transfers Overall transfer level: Needs assistance Equipment used: Rolling walker (2 wheels) Transfers: Sit to/from Stand Sit to Stand: From elevated surface, Min assist, Mod assist           General transfer comment: Pt unable to rise from standard height bed with min assist or from 3" elevated surface with min assist.  5-6" of elevation needed to rise today    Ambulation/Gait Ambulation/Gait assistance: Mod assist Gait Distance (Feet): 3 Feet Assistive device: Rolling walker (2 wheels)         General Gait Details: Pt did not have the buckling today in the R, but was very hesitant to do much WBing at all and though he showed good effort even with min/mod assist and heavy cuing for walker use, encouargement, etc he struggled with even very limited, laborious "ambulation" effort   Stairs             Wheelchair Mobility    Modified Rankin (Stroke Patients Only)       Balance Overall balance assessment: Needs assistance Sitting-balance support: Bilateral upper extremity supported, Feet supported Sitting balance-Leahy Scale: Fair     Standing balance support: Reliant on assistive device for balance, Bilateral upper extremity supported Standing balance-Leahy Scale: Poor Standing balance comment: heavy UE reliance on RW,poor tolerance of WBing on  RLE                            Cognition Arousal/Alertness: Awake/alert Behavior During Therapy: WFL for tasks assessed/performed Overall Cognitive Status: Within Functional Limits for tasks assessed                                           Exercises Total Joint Exercises Ankle Circles/Pumps: AROM, 15 reps Quad Sets: Strengthening, 15 reps Short Arc Quad: AROM, 10 reps Heel Slides: AAROM, 10 reps (with lightly resisted leg ext) Hip ABduction/ADduction: AROM, 10 reps, AAROM    General Comments General comments (skin integrity, edema, etc.): weak and pain limited      Pertinent Vitals/Pain Pain Assessment Pain Assessment: Faces Faces Pain Scale: Hurts even more    Home Living                          Prior Function            PT Goals (current goals can now be found in the care plan section) Progress towards PT goals: Progressing toward goals    Frequency    7X/week      PT Plan      Co-evaluation              AM-PAC PT "6 Clicks" Mobility   Outcome Measure  Help needed turning from your back to your side while in a flat bed without using bedrails?: A Little Help needed moving from lying on your back to sitting on the side of a flat bed without using bedrails?: A Lot Help needed moving to and from a bed to a chair (including a wheelchair)?: A Lot Help needed standing up from a chair using your arms (e.g., wheelchair or bedside chair)?: A Lot Help needed to walk in hospital room?: Total Help needed climbing 3-5 steps with a railing? : Total 6 Click Score: 11    End of Session Equipment Utilized During Treatment: Gait belt Activity Tolerance: Patient limited by fatigue;Patient limited by pain Patient left: with call bell/phone within reach;with chair alarm set Nurse Communication: Mobility status PT Visit Diagnosis: Unsteadiness on feet (R26.81);Other abnormalities of gait and mobility (R26.89);Muscle weakness (generalized) (M62.81);Difficulty in walking, not elsewhere classified (R26.2);Pain Pain - Right/Left: Right Pain - part of body: Hip     Time: 1696-7893 PT Time Calculation (min) (ACUTE ONLY): 30 min  Charges:  $Gait Training: 8-22 mins $Therapeutic Exercise: 8-22  mins                     [name], DPT 06/12/2022, 1:00 PM

## 2022-06-12 NOTE — Discharge Instructions (Addendum)
Instructions after Hip Replacement     J. Dorien Chihuahua, M.D.  Raquel Jamesina Gaugh, PA-C     Dept[facility]  [place], [place]  [address]  Phone: [phone]   Fax: [phone]    DIET: Drink plenty of non-alcoholic fluids. Resume your normal diet. Include foods high in fiber.  ACTIVITY:  You may use crutches or a walker with weight-bearing as tolerated, unless instructed otherwise. You may be weaned off of the walker or crutches by your Physical Therapist.  Do NOT reach below the level of your knees or cross your legs until allowed.    Continue doing gentle exercises. Exercising will reduce the pain and swelling, increase motion, and prevent muscle weakness.   Please continue to use the TED compression stockings for 6 weeks. You may remove the stockings at night, but should reapply them in the morning. Do not drive or operate any equipment until instructed.  WOUND CARE:  Continue to use ice packs periodically to reduce pain and swelling. Keep the incision clean and dry. You may bathe or shower after the staples are removed at the first office visit following surgery.  MEDICATIONS: You may resume your regular medications. Please take the pain medication as prescribed on the medication. Do not take pain medication on an empty stomach. You have been given a prescription for a blood thinner to prevent blood clots. Please take the medication as instructed.  You will take Eliquis for two weeks and then restart your plavix prescription. Pain medications and iron supplements can cause constipation. Use a stool softener (Senokot or Colace) on a daily basis and a laxative (dulcolax or miralax) as needed. Do not drive or drink alcoholic beverages when taking pain medications.  CALL THE OFFICE FOR: Temperature above 101 degrees Excessive bleeding or drainage on the dressing. Excessive swelling, coldness, or paleness of the  toes. Persistent nausea and vomiting.  FOLLOW-UP:  You should have an appointment to return to the office in 2 weeks after surgery. Arrangements have been made for continuation of Physical Therapy (either home therapy or outpatient therapy).

## 2022-06-12 NOTE — Plan of Care (Signed)

## 2022-06-12 NOTE — Progress Notes (Signed)
Physical Therapy Treatment Patient Details Name: Rick Mcbride. 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  He showed very good effort, and was able to perform bed exercises better this afternoon than this AM but mobility remains a big limiter.  Pt continues to be unsafe to go home, he and wife understand the need for STR.    Recommendations for follow up therapy are one component of a multi-disciplinary discharge planning process, led by the attending physician.  Recommendations may be updated based on patient status, additional functional criteria and insurance authorization.  Follow Up Recommendations  Skilled nursing-short term rehab (<3 hours/day) Can patient physically be transported by private vehicle: No   Assistance Recommended at Discharge Frequent or constant Supervision/Assistance  Patient can return home with the following A lot of help with bathing/dressing/bathroom;Two people to help with walking and/or transfers;Assistance with cooking/housework;Assist for transportation;Help with stairs or ramp for entrance   Equipment Recommendations       Recommendations for Other Services       Precautions / Restrictions Precautions Precautions: Posterior Hip Restrictions Weight Bearing Restrictions: Yes RLE Weight Bearing: Weight bearing as tolerated     Mobility  Bed Mobility Overal bed mobility: Needs Assistance Bed Mobility: Sit to Supine     Supine to sit: Mod assist Sit to  supine: Mod assist, Max assist   General bed mobility comments: heavy assist to get LEs back up in bed    Transfers Overall transfer level: Needs assistance Equipment used: Rolling walker (2 wheels) Transfers: Sit to/from Stand Sit to Stand: Mod assist, Max assist           General transfer comment: struggled with rising from recliner despite good b/l UE positioning, heavy assist from PT to attain standing    Ambulation/Gait Ambulation/Gait assistance: Mod assist, Max assist Gait Distance (Feet): 3 Feet Assistive device: Rolling walker (2 wheels)         General Gait Details: no real ambulation this afternoon, able to laboriously and with heavy assist and cuing shuffle feet enough to turn hips from recliner to bed, occasional ability to take some weight through R LE but L LE essentially heel-toe shuffle only   Stairs             Wheelchair Mobility    Modified Rankin (Stroke Patients Only)       Balance Overall balance assessment: Needs assistance Sitting-balance support: Bilateral upper extremity supported, Feet supported Sitting balance-Leahy Scale: Fair     Standing balance support: Reliant on assistive device for balance, Bilateral upper extremity supported Standing balance-Leahy Scale: Poor Standing balance comment: heavy UE reliance on RW,poor tolerance of WBing on RLE                            Cognition Arousal/Alertness: Awake/alert Behavior During Therapy: WFL for tasks assessed/performed Overall Cognitive Status: Within Functional Limits for tasks assessed  Exercises Total Joint Exercises Ankle Circles/Pumps: AROM, 15 reps Quad Sets: Strengthening, 15 reps Short Arc Quad: AROM, Strengthening, 10 reps Heel Slides: AAROM, 10 reps, AROM (resisted leg ext) Hip ABduction/ADduction: AROM, Strengthening, 10 reps    General Comments        Pertinent Vitals/Pain Pain  Assessment Pain Assessment: Faces Faces Pain Scale: Hurts even more Pain Location: R hip    Home Living                          Prior Function            PT Goals (current goals can now be found in the care plan section) Progress towards PT goals: Progressing toward goals    Frequency    7X/week      PT Plan Current plan remains appropriate    Co-evaluation              AM-PAC PT "6 Clicks" Mobility   Outcome Measure  Help needed turning from your back to your side while in a flat bed without using bedrails?: A Little Help needed moving from lying on your back to sitting on the side of a flat bed without using bedrails?: A Lot Help needed moving to and from a bed to a chair (including a wheelchair)?: A Lot Help needed standing up from a chair using your arms (e.g., wheelchair or bedside chair)?: A Lot Help needed to walk in hospital room?: Total Help needed climbing 3-5 steps with a railing? : Total 6 Click Score: 11    End of Session Equipment Utilized During Treatment: Gait belt Activity Tolerance: Patient limited by fatigue;Patient limited by pain Patient left: with call bell/phone within reach;with chair alarm set Nurse Communication: Mobility status PT Visit Diagnosis: Unsteadiness on feet (R26.81);Other abnormalities of gait and mobility (R26.89);Muscle weakness (generalized) (M62.81);Difficulty in walking, not elsewhere classified (R26.2);Pain Pain - Right/Left: Right Pain - part of body: Hip     Time: 1545-1610 PT Time Calculation (min) (ACUTE ONLY): 25 min  Charges:  $Therapeutic Exercise: 8-22 mins $Therapeutic Activity: 8-22 mins                     Barret Esquivel R Azalynn Maxim. DPT 06/12/2022, 4:28 PM

## 2022-06-13 DIAGNOSIS — I509 Heart failure, unspecified: Secondary | ICD-10-CM | POA: Diagnosis not present

## 2022-06-13 DIAGNOSIS — I5031 Acute diastolic (congestive) heart failure: Secondary | ICD-10-CM | POA: Diagnosis present

## 2022-06-13 DIAGNOSIS — D62 Acute posthemorrhagic anemia: Secondary | ICD-10-CM | POA: Diagnosis not present

## 2022-06-13 DIAGNOSIS — N179 Acute kidney failure, unspecified: Secondary | ICD-10-CM | POA: Diagnosis not present

## 2022-06-13 DIAGNOSIS — S72001A Fracture of unspecified part of neck of right femur, initial encounter for closed fracture: Secondary | ICD-10-CM | POA: Diagnosis not present

## 2022-06-13 LAB — BASIC METABOLIC PANEL
Anion gap: 6 (ref 5–15)
BUN: 54 mg/dL — ABNORMAL HIGH (ref 8–23)
CO2: 21 mmol/L — ABNORMAL LOW (ref 22–32)
Calcium: 8.4 mg/dL — ABNORMAL LOW (ref 8.9–10.3)
Chloride: 112 mmol/L — ABNORMAL HIGH (ref 98–111)
Creatinine, Ser: 2.47 mg/dL — ABNORMAL HIGH (ref 0.61–1.24)
GFR, Estimated: 26 mL/min — ABNORMAL LOW (ref >60)
Glucose, Bld: 126 mg/dL — ABNORMAL HIGH (ref 70–99)
Potassium: 4.7 mmol/L (ref 3.5–5.1)
Sodium: 139 mmol/L (ref 135–145)

## 2022-06-13 LAB — CBC
HCT: 27.2 % — ABNORMAL LOW (ref 39.0–52.0)
Hemoglobin: 8.6 g/dL — ABNORMAL LOW (ref 13.0–17.0)
MCH: 28.8 pg (ref 26.0–34.0)
MCHC: 31.6 g/dL (ref 30.0–36.0)
MCV: 91 fL (ref 80.0–100.0)
Platelets: 120 10*3/uL — ABNORMAL LOW (ref 150–400)
RBC: 2.99 MIL/uL — ABNORMAL LOW (ref 4.22–5.81)
RDW: 15.7 % — ABNORMAL HIGH (ref 11.5–15.5)
WBC: 7.8 10*3/uL (ref 4.0–10.5)
nRBC: 0 % (ref 0.0–0.2)

## 2022-06-13 LAB — PROCALCITONIN: Procalcitonin: 0.1 ng/mL

## 2022-06-13 LAB — BRAIN NATRIURETIC PEPTIDE: B Natriuretic Peptide: 1561.1 pg/mL — ABNORMAL HIGH (ref 0.0–100.0)

## 2022-06-13 MED ORDER — SODIUM CHLORIDE 0.9 % IV SOLN: INTRAVENOUS | Status: DC | PRN

## 2022-06-13 MED ORDER — FUROSEMIDE 10 MG/ML IJ SOLN
40.0000 mg | Freq: Two times a day (BID) | INTRAMUSCULAR | Status: DC
Start: 2022-06-13 — End: 2022-06-15
  Administered 2022-06-13 – 2022-06-14 (×3): 40 mg via INTRAVENOUS
  Filled 2022-06-13 (×4): qty 4

## 2022-06-13 MED ORDER — SODIUM CHLORIDE 0.9 % IV SOLN
12.5000 mg | Freq: Once | INTRAVENOUS | Status: AC
  Administered 2022-06-13: 12.5 mg via INTRAVENOUS
  Filled 2022-06-13: qty 0.5

## 2022-06-13 MED ORDER — LOSARTAN POTASSIUM 50 MG PO TABS
100.0000 mg | ORAL_TABLET | Freq: Every day | ORAL | Status: DC
  Administered 2022-06-13 – 2022-06-15 (×3): 100 mg via ORAL
  Filled 2022-06-13 (×3): qty 2

## 2022-06-13 MED ORDER — HYDRALAZINE HCL 20 MG/ML IJ SOLN
10.0000 mg | Freq: Four times a day (QID) | INTRAMUSCULAR | Status: DC | PRN
  Administered 2022-06-13: 10 mg via INTRAVENOUS
  Filled 2022-06-13: qty 1

## 2022-06-13 MED ORDER — IPRATROPIUM-ALBUTEROL 0.5-2.5 (3) MG/3ML IN SOLN
3.0000 mL | Freq: Four times a day (QID) | RESPIRATORY_TRACT | Status: DC | PRN
  Administered 2022-06-13: 3 mL via RESPIRATORY_TRACT
  Filled 2022-06-13: qty 3

## 2022-06-13 MED ORDER — FUROSEMIDE 10 MG/ML IJ SOLN
20.0000 mg | Freq: Once | INTRAMUSCULAR | Status: AC
  Administered 2022-06-13: 20 mg via INTRAVENOUS
  Filled 2022-06-13: qty 4

## 2022-06-13 NOTE — Assessment & Plan Note (Addendum)
No previous history.  Echocardiogram noted grade 1 diastolic dysfunction.  Initially received IV fluids secondary to acute kidney injury.  Following elevation of blood pressure, fluids stopped and patient started on IV Lasix.  By day of discharge, had diuresed over 11 L and was -6 L deficient.  Lasix discontinued.

## 2022-06-13 NOTE — Progress Notes (Signed)
Physical Therapy Treatment Patient Details Name: Rick Mcbride. 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  Was awake and able to participate late morning.  Pt continues to have a lot of pain, hesitation and generally speaking struggles with all mobility, WBing, ambulation, etc.  He again managed just a few side shuffles but no real steps and needed heavy cuing and direct assist just to maintain some safety and was ultimately very limited functionally.  Pt motivated but simply unable to fully participate with PT due to pain, fatigue.    Recommendations for follow up therapy are one component of a multi-disciplinary discharge planning process, led by the attending physician.  Recommendations may be updated based on patient status, additional functional criteria and insurance authorization.  Follow Up Recommendations  Skilled nursing-short term rehab (<3 hours/day) Can patient physically be transported by private vehicle: No   Assistance Recommended at Discharge Frequent or constant Supervision/Assistance  Patient can return home with the following A lot of help with bathing/dressing/bathroom;Two people to help with walking and/or transfers;Assistance with cooking/housework;Assist for transportation;Help with stairs or ramp for entrance   Equipment Recommendations       Recommendations for Other Services       Precautions / Restrictions Precautions Precautions: Posterior  Hip;Fall Restrictions RLE Weight Bearing: Weight bearing as tolerated     Mobility  Bed Mobility Overal bed mobility: Needs Assistance Bed Mobility: Sit to Supine     Supine to sit: Mod assist Sit to supine: Mod assist, Max assist   General bed mobility comments: heavy assist getting to sitting, even more to get LEs back up in bed    Transfers Overall transfer level: Needs assistance Equipment used: Rolling walker (2 wheels) Transfers: Sit to/from Stand Sit to Stand: Max assist, From elevated surface           General transfer comment: Pt again needing excessive cuing for set up and to actually rise to standing.  Poor strength, coordinaiton and tolerance despite resasonable effort    Ambulation/Gait               General Gait Details: Pt again very much struggling with actually taking steps with inability to effectively use UEs to unweight and allow movement of either LEs.  Very laborious effort with heel-toe side shuffling that were typically unsuccessful w/o heavy assist.   Stairs             Wheelchair Mobility    Modified Rankin (Stroke Patients Only)       Balance Overall balance assessment: Needs assistance Sitting-balance support: Bilateral upper extremity supported, Feet supported Sitting balance-Leahy Scale: Poor Sitting balance - Comments: Pt unable to maintain static sitting today even with direct assist for UEs on bed/rail.  Pt falling backward consistently with <mod assist to just maintain sitting EOB     Standing balance-Leahy Scale: Poor Standing balance comment: heavy UE reliance on RW,poor tolerance of WBing on RLE  Cognition Arousal/Alertness: Awake/alert Behavior During Therapy: WFL for tasks assessed/performed Overall Cognitive Status: Within Functional Limits for tasks assessed                                          Exercises Total Joint Exercises Ankle  Circles/Pumps: AROM, 15 reps Quad Sets: Strengthening, 15 reps Short Arc Quad: AROM, Strengthening, 10 reps Heel Slides: AAROM, 10 reps, AROM (resisted leg ext) Hip ABduction/ADduction: AROM, Strengthening, 10 reps    General Comments        Pertinent Vitals/Pain Pain Assessment Pain Assessment: 0-10 Pain Score: 3  (minimal pain at rest, c/o severe pain with any R LE movement)    Home Living                          Prior Function            PT Goals (current goals can now be found in the care plan section) Progress towards PT goals: Progressing toward goals    Frequency    7X/week      PT Plan Current plan remains appropriate    Co-evaluation              AM-PAC PT "6 Clicks" Mobility   Outcome Measure  Help needed turning from your back to your side while in a flat bed without using bedrails?: A Lot Help needed moving from lying on your back to sitting on the side of a flat bed without using bedrails?: A Lot Help needed moving to and from a bed to a chair (including a wheelchair)?: A Lot Help needed standing up from a chair using your arms (e.g., wheelchair or bedside chair)?: A Lot Help needed to walk in hospital room?: Total Help needed climbing 3-5 steps with a railing? : Total 6 Click Score: 10    End of Session Equipment Utilized During Treatment: Gait belt Activity Tolerance: Patient limited by fatigue;Patient limited by pain Patient left: with call bell/phone within reach;with chair alarm set Nurse Communication: Mobility status PT Visit Diagnosis: Unsteadiness on feet (R26.81);Other abnormalities of gait and mobility (R26.89);Muscle weakness (generalized) (M62.81);Difficulty in walking, not elsewhere classified (R26.2);Pain Pain - Right/Left: Right Pain - part of body: Hip     Time: 1132-1201 PT Time Calculation (min) (ACUTE ONLY): 29 min  Charges:  $Therapeutic Exercise: 8-22 mins $Therapeutic Activity: 8-22 mins                      [name], DPT 06/13/2022, 12:50 PM

## 2022-06-13 NOTE — Progress Notes (Signed)
Subjective: 3 Days Post-Op Procedure(s) (LRB): ARTHROPLASTY BIPOLAR HIP (HEMIARTHROPLASTY) (Right) Patient reports pain as moderate.   Patient is well, and has had no acute complaints or problems Wife reports that the patient was very confused last night. Current plan is for discharge to [place] on Monday. Negative for chest pain and shortness of breath Fever: no Gastrointestinal:Negative for nausea and vomiting.  Reports that he is passing gas this morning.  Objective: Vital signs in last 24 hours: Temp:  [98.3 F (36.8 C)-99.9 F (37.7 C)] 99.9 F (37.7 C) (08/06 0739) Pulse Rate:  [61-80] 66 (08/06 0739) Resp:  [16-20] 16 (08/06 0739) BP: (140-194)/(65-86) 183/81 (08/06 0739) SpO2:  [95 %-99 %] 98 % (08/06 0739)  Intake/Output from previous day:  Intake/Output Summary (Last 24 hours) at 06/13/2022 0800 Last data filed at 06/13/2022 0500 Gross per 24 hour  Intake 2230.67 ml  Output 3000 ml  Net -769.33 ml    Intake/Output this shift: No intake/output data recorded.  Labs: Recent Labs    06/11/22 0420 06/12/22 0456 06/13/22 0417  HGB 9.5* 8.3* 8.6*   Recent Labs    06/12/22 0456 06/13/22 0417  WBC 9.1 7.8  RBC 2.90* 2.99*  HCT 26.7* 27.2*  PLT 118* 120*   Recent Labs    06/12/22 0456 06/13/22 0417  NA 139 139  K 5.0 4.7  CL 110 112*  CO2 22 21*  BUN 61* 54*  CREATININE 2.78* 2.47*  GLUCOSE 125* 126*  CALCIUM 8.3* 8.4*   No results for input(s): "LABPT", "INR" in the last 72 hours.    EXAM General - Patient is Alert, Appropriate, and Oriented Extremity - ABD soft Neurovascular intact Dorsiflexion/Plantar flexion intact Incision: scant drainage No cellulitis present Dressing/Incision - Mild bloody drainage noted to the proximal aspect of the incision Motor Function - intact, moving foot and toes well on exam.  Abdomen soft with intact bowel sounds this morning.  Past Medical History:  Diagnosis Date   AAA (abdominal aortic aneurysm) ([place])     a.) s/p EVAR 01/05/2017. b.) native aneurysm sac 6.6 x 6.9 cm by CT on 03/31/2021   Abnormality of tongue    a.) CT head/neck 07/10/2021 --> asymmetric soft tissue at the RIGHT tongue base with a superficial 8 mm lesion.   Anemia    Aneurysm of right common iliac artery (HCC)    a.) measured 2.7 cm by CT on 03/31/2021   Anxiety    Aortic atherosclerosis (HCC)    Atrophic kidney    B12 deficiency    CAD (coronary artery disease)    Cervical radiculopathy    Chronic airway obstruction (HCC)    Chronic kidney disease (CKD), stage III (moderate) (HCC)    Chronic pain syndrome 06/16/2021   Chronic right shoulder pain 11/12/2019   Chronic tension headaches    Chronic, continuous use of opioids 06/16/2021   Coronary artery disease    DDD (degenerative disc disease), lumbar    Degenerative disc disease, lumbar    with lumbar radiculopathy   Elbow fracture, left    GERD (gastroesophageal reflux disease)    H/O adenomatous polyp of colon    H/O hemorrhoids    HTN (hypertension)    Hyperlipidemia    Meralgia paresthetica    Mild cognitive impairment    Nephrolithiasis    Neuralgia    Numbness of right foot 02/19/2021   Osteoarthritis    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   Presence of permanent  cardiac pacemaker    Prostate cancer [facility])    a.) s/p prostatectomy   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   Weight as of this encounter: 95.2 kg. Advance diet Up with therapy D/C IV fluids when tolerating po intake.  Labs reviewed, Hg 8.6 this morning, continue to monitor. WBC trending down to 7.8.  Hyperkalemia resolved. Up with therapy today.  Plan for discharge to [place] on Monday. Patient with confusion last night, attempt to use tylenol primarily for pain control.  Limit tramadol Patient has been having hiccups over the past several hours. Chlorpromazine has been ordered, can add baclofen if it does not improve this morning. Patient is passing gas, begin working on BM.  DVT Prophylaxis -  Eliquis, SCDs Weight-Bearing as tolerated to right leg  J. Cameron Proud, PA-C [facility] Orthopaedic Surgery 06/13/2022, 8:00 AM

## 2022-06-13 NOTE — Plan of Care (Signed)
  Problem: Health Behavior/Discharge Planning: Goal: Ability to manage health-related needs will improve Outcome: Progressing   Problem: Clinical Measurements: Goal: Will remain free from infection Outcome: Progressing Goal: Diagnostic test results will improve Outcome: Progressing   Problem: Activity: Goal: Risk for activity intolerance will decrease Outcome: Progressing   Problem: Nutrition: Goal: Adequate nutrition will be maintained Outcome: Progressing   Problem: Elimination: Goal: Will not experience complications related to bowel motility Outcome: Progressing   Problem: Pain Managment: Goal: General experience of comfort will improve Outcome: Progressing

## 2022-06-13 NOTE — Progress Notes (Signed)
[facility] Progress Note  Patient: Rick Mcbride.    GUY:[id number]  DOA: 06/09/2022    Date of Service: the patient was seen and examined on 06/13/2022  Brief hospital course: 81 year old male with past medical history of hypertension, lung cancer status post lobectomy, cancer of the base of the tongue status post radiation therapy which completed in February of this year, CAD status post CABG, stage IV chronic kidney disease, secondary AV block status post pacemaker, mild cognitive deficit who presented to the emergency room on the early morning of 8/3 after he sustained a accidental fall while going to the bathroom.  Patient fell hitting his head, but did not lose consciousness, but he was unable to stand.  The emergency room, patient found to have a creatinine of 2.96 (baseline of 2.03) and x-rays noted a displaced right femoral neck fracture.  Patient was admitted to the hospitalist service and orthopedics were consulted who plan to take patient to the operating room on afternoon of 8/3.  Postop, patient with blood loss anemia from surgery.  Was receiving hydration for renal function.  Patient blood pressure has been trending up slowly since hospitalization.  On 8/6, BNP checked and found to be elevated at 1500.  No history of CHF.  Fluids stopped and started on IV Lasix.  Echocardiogram ordered.  Seen by PT and OT who are recommending skilled nursing history  Assessment and Plan: Assessment and Plan: * Fracture of femoral neck, right, closed (HCC) Moderate risk given comorbidities, but overall stable for surgery.  Kept NPO.  Held aspirin and Plavix.  Morphine for pain control NPO.  Plan is for hip repair later today.  Some blood loss anemia, see below.  PT recommending short-term skilled nursing  Acute CHF (congestive heart failure) ([place]) No previous history.  Checking echocardiogram.  Have stopped IV fluids and started IV Lasix.  Acute postoperative anemia due to expected blood  loss Hemoglobin initially 11.5, but that was in the setting of acute kidney injuries, so likely lower than this due to hemoconcentration.  Following expected blood loss anemia from surgery as well as hydration, hemoglobin down as low as 8.3 [place] started since trend back upward.  Acute renal failure superimposed on stage 4 chronic kidney disease [facility]) Nephrology consulted.  Creatinine 3-1/2 years ago at 2.03 with a GFR of 36.  Presented to the emergency room with creatinine of 2.96 and GFR of 21.  Patient will need more IV fluids given worsening renal failure secondary to blood loss.  See above.  Thoracic aortic aneurysm without rupture [facility]) AAA s/p endovascular repair Followed by vascular surgeon, Dr. Lucky Cowboy On Plavix, aspirin and atorvastatin Patient denies abdominal pain or chest pain Holding Plavix and aspirin for surgery  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[place]) Completed XRT in February 2023  Presence of permanent cardiac pacemaker Gets scheduled pacemaker checks.  No acute issues suspected  CAD S/P CABG x 3 Patient denies chest pain or shortness of breath.  Has good functional capacity Last seen by his cardiologist in March 2023 Currently on aspirin and atorvastatin and metoprolol   Cancer of lung ([place]) S/p lobectomy.  Follows with oncology  Mild cognitive impairment Continue Aricept Delirium precautions  Essential hypertension Blood pressure has been trending upward, secondary to volume overload.  Should improve now that he is getting consistent Lasix and fluids stopped.  Overweight (BMI 25.0-29.9) Meets criteria BMI greater than 25       Body mass index is 29.27  kg/m.  Nutrition Problem: Increased nutrient needs Etiology: post-op healing     Consultants: Orthopedic surgery Nephrology  Procedures: Status post hip fracture repair 8/3  Antimicrobials: Preop Ancef  Code Status: Full code   Subjective: Complains of feeling very  weak.  Objective: Noted elevated blood pressures as high as the 190s. Vitals:   06/13/22 0816 06/13/22 1123  BP: (!) 195/89 (!) 188/84  Pulse: 65 65  Resp: 16 16  Temp:  98.9 F (37.2 C)  SpO2: 100% 99%    Intake/Output Summary (Last 24 hours) at 06/13/2022 1538 Last data filed at 06/13/2022 1400 Gross per 24 hour  Intake 1254.78 ml  Output 4400 ml  Net -3145.22 ml   Filed Weights   06/10/22 0000 06/10/22 1231  Weight: 95.2 kg 95.2 kg   Body mass index is 29.27 kg/m.  Exam:  General: Alert and oriented x2, fatigued HEENT: Normocephalic, atraumatic, mucous membranes slightly dry Cardiovascular: Regular rate and rhythm, S1-S2 Respiratory: Decreased breath sounds throughout Abdomen: Soft, nontender, nondistended, positive bowel sounds Musculoskeletal: No clubbing or cyanosis or edema Skin: No skin breaks, tears or lesions Psychiatry: Appropriate, no evidence of psychoses Neurology: No focal deficits  Data Reviewed: Creatinine 2.47.  BNP of 1500.  Hemoglobin up to 8.6.  Disposition:  Status is: Inpatient Remains inpatient appropriate because: Short-term skilled nursing placement CHF work-up, full diuresis  Anticipated discharge date: 8/9  Family Communication: Left message for family DVT Prophylaxis: apixaban (ELIQUIS) tablet 2.5 mg Start: 06/11/22 1000 SCDs Start: 06/10/22 1739 apixaban (ELIQUIS) tablet 2.5 mg    Author: Annita Brod ,MD 06/13/2022 3:38 PM  To reach On-call, see care teams to locate the attending and reach out via [email or web address]. Between 7PM-7AM, please contact night-coverage If you still have difficulty reaching the attending provider, please page the [facility] (Director on Call) for [facility] on amion for assistance.

## 2022-06-14 ENCOUNTER — Inpatient Hospital Stay
Admit: 2022-06-14 | Discharge: 2022-06-14 | Disposition: A | Discharge status: Home / Self Care | Payer: PPO | Attending: Internal Medicine | Admitting: Internal Medicine

## 2022-06-14 DIAGNOSIS — R066 Hiccough: Secondary | ICD-10-CM | POA: Diagnosis not present

## 2022-06-14 DIAGNOSIS — N179 Acute kidney failure, unspecified: Secondary | ICD-10-CM | POA: Diagnosis not present

## 2022-06-14 DIAGNOSIS — D62 Acute posthemorrhagic anemia: Secondary | ICD-10-CM | POA: Diagnosis not present

## 2022-06-14 DIAGNOSIS — S72001A Fracture of unspecified part of neck of right femur, initial encounter for closed fracture: Secondary | ICD-10-CM | POA: Diagnosis not present

## 2022-06-14 DIAGNOSIS — I509 Heart failure, unspecified: Secondary | ICD-10-CM | POA: Diagnosis not present

## 2022-06-14 LAB — BASIC METABOLIC PANEL
Anion gap: 7 (ref 5–15)
BUN: 55 mg/dL — ABNORMAL HIGH (ref 8–23)
CO2: 24 mmol/L (ref 22–32)
Calcium: 8.9 mg/dL (ref 8.9–10.3)
Chloride: 105 mmol/L (ref 98–111)
Creatinine, Ser: 2.3 mg/dL — ABNORMAL HIGH (ref 0.61–1.24)
GFR, Estimated: 28 mL/min — ABNORMAL LOW (ref >60)
Glucose, Bld: 126 mg/dL — ABNORMAL HIGH (ref 70–99)
Potassium: 4.2 mmol/L (ref 3.5–5.1)
Sodium: 136 mmol/L (ref 135–145)

## 2022-06-14 LAB — SURGICAL PATHOLOGY

## 2022-06-14 MED ORDER — CHLORPROMAZINE HCL 10 MG PO TABS
10.0000 mg | ORAL_TABLET | Freq: Three times a day (TID) | ORAL | Status: DC | PRN
  Administered 2022-06-14: 10 mg via ORAL
  Filled 2022-06-14 (×2): qty 1

## 2022-06-14 MED ORDER — PERFLUTREN LIPID MICROSPHERE
1.0000 mL | INTRAVENOUS | Status: AC | PRN
  Administered 2022-06-14: 2 mL via INTRAVENOUS

## 2022-06-14 MED ORDER — CHLORPROMAZINE HCL 10 MG PO TABS
10.0000 mg | ORAL_TABLET | Freq: Four times a day (QID) | ORAL | Status: DC
  Administered 2022-06-14 – 2022-06-15 (×3): 10 mg via ORAL
  Filled 2022-06-14 (×4): qty 1

## 2022-06-14 MED ORDER — LACTULOSE 10 GM/15ML PO SOLN
10.0000 g | Freq: Once | ORAL | Status: AC
  Administered 2022-06-14: 10 g via ORAL
  Filled 2022-06-14: qty 30

## 2022-06-14 MED ORDER — TRAMADOL HCL 50 MG PO TABS: 50.0000 mg | ORAL_TABLET | Freq: Four times a day (QID) | ORAL | 0 refills | Status: DC | PRN

## 2022-06-14 MED ORDER — CALCIUM CARBONATE ANTACID 500 MG PO CHEW
1.0000 | CHEWABLE_TABLET | Freq: Every day | ORAL | Status: DC | PRN
  Administered 2022-06-14: 200 mg via ORAL
  Filled 2022-06-14: qty 1

## 2022-06-14 NOTE — Progress Notes (Signed)
Occupational Therapy Treatment Patient Details Name: Rick Mcbride. MRN: [id number] DOB: 16-Sep-1941 Today's Date: 06/14/2022   History of present illness Pt is an 81 year old male s/p R hemiarthroplasty 06/10/22 after fall; PMH significant for hypertension, lung cancer status post lobectomy, cancer of the base of the tongue status post radiation therapy which completed in February of this year, CAD status post CABG, stage IV chronic kidney disease, secondary AV block status post pacemaker, mild cognitive deficit   OT comments  Pt seen for OT tx. Pt with spouse and nurse tech in room. MAX A +2 to scoot up in bed for optimal positioning. Pt/spouse instructed in hip precautions (neither recalled any precautions) and how to maintain with ADL/mobility. Pt/spouse verbalized understanding. Provided with handout to support recall. Breakfast tray delivered and spouse provided set up. RN notified pt requesting pain meds prior to EOB/OOB activity. Pt continues to benefit from skilled OT services to maximize return to PLOF.    Recommendations for follow up therapy are one component of a multi-disciplinary discharge planning process, led by the attending physician.  Recommendations may be updated based on patient status, additional functional criteria and insurance authorization.    Follow Up Recommendations  Skilled nursing-short term rehab (<3 hours/day)    Assistance Recommended at Discharge Frequent or constant Supervision/Assistance  Patient can return home with the following  A lot of help with bathing/dressing/bathroom;A lot of help with walking and/or transfers   Equipment Recommendations  Other (comment) (defer to next venue)    Recommendations for Other Services      Precautions / Restrictions Precautions Precautions: Posterior Hip;Fall Precaution Booklet Issued: Yes (comment) Precaution Comments: R hemi, posterior precautions Restrictions Weight Bearing Restrictions: Yes RLE Weight  Bearing: Weight bearing as tolerated       Mobility Bed Mobility               General bed mobility comments: MAX +2 to scoot up in bed    Transfers                         Balance                                           ADL either performed or assessed with clinical judgement   ADL Overall ADL's : Needs assistance/impaired   Eating/Feeding Details (indicate cue type and reason): spouse provided set up Grooming: Sitting;Wash/dry face;Wash/dry hands;Set up                                      Extremity/Trunk Assessment              Vision       Perception     Praxis      Cognition Arousal/Alertness: Awake/alert Behavior During Therapy: WFL for tasks assessed/performed Overall Cognitive Status: Within Functional Limits for tasks assessed                                 General Comments: poor recall of precautions        Exercises Other Exercises Other Exercises: Pt/spouse insturcted in hip precautions (neither recalled any precautions) and how to maintain with ADL/mobility    Shoulder Instructions  General Comments      Pertinent Vitals/ Pain       Pain Assessment Pain Assessment: 0-10 Pain Score: 6  Pain Location: R hip Pain Descriptors / Indicators: Grimacing, Guarding Pain Intervention(s): Limited activity within patient's tolerance, Monitored during session, Repositioned, Patient requesting pain meds-RN notified  Home Living                                          Prior Functioning/Environment              Frequency  Min 2X/week        Progress Toward Goals  OT Goals(current goals can now be found in the care plan section)  Progress towards OT goals: Progressing toward goals  Acute Rehab OT Goals Patient Stated Goal: get stronger OT Goal Formulation: With patient Time For Goal Achievement: 06/25/22 Potential to Achieve Goals: Good   Plan Discharge plan remains appropriate;Frequency remains appropriate    Co-evaluation                 AM-PAC OT "6 Clicks" Daily Activity     Outcome Measure   Help from another person eating meals?: None Help from another person taking care of personal grooming?: None Help from another person toileting, which includes using toliet, bedpan, or urinal?: A Lot Help from another person bathing (including washing, rinsing, drying)?: A Lot Help from another person to put on and taking off regular upper body clothing?: A Little Help from another person to put on and taking off regular lower body clothing?: A Lot 6 Click Score: 17    End of Session    OT Visit Diagnosis: Unsteadiness on feet (R26.81);Muscle weakness (generalized) (M62.81)   Activity Tolerance Patient limited by pain   Patient Left in bed;with call bell/phone within reach;with bed alarm set;with SCD's reapplied;with family/visitor present   Nurse Communication Patient requests pain meds        Time: 5916-3846 OT Time Calculation (min): 16 min  Charges: OT General Charges $OT Visit: 1 Visit OT Treatments $Self Care/Home Management : 8-22 mins  Ardeth Perfect., MPH, MS, OTR/L ascom [phone] 06/14/22, 8:59 AM

## 2022-06-14 NOTE — Progress Notes (Signed)
PT Cancellation Note  Patient Details Name: Rick Mcbride. MRN: [id number] DOB: 26-Aug-1941   Cancelled Treatment:     Pt receiving Echocardiogram at bedside. Will try to return if time permits. Continue PT per POC, continue to recommend SNF once medically stable.   Josie Dixon 06/14/2022, 10:45 AM

## 2022-06-14 NOTE — TOC Progression Note (Signed)
Transition of Care (TOC) - Progression Note    Patient Details  Name: Makana Tyrek Lawhorn. MRN: [id number] Date of Birth: 1941/09/21  Transition of Care [facility]) CM/SW Contact  Laurena Slimmer, RN Phone Number: 06/14/2022, 4:12 PM  Clinical Narrative:    Spoke with patient's wife. Patient wife states frustration about patient not being discharged to [place] today. Patient wife advised patient would discharge when he was medically stable. Patient wife requesting to speak with attending regarding hiccups and discharge plan. Message sent to Dr. [place] Pink.    Expected Discharge Plan: [place] Barriers to Discharge: Continued Medical Work up  Expected Discharge Plan and Services Expected Discharge Plan: [place] arrangements for the past 2 months: Single Family Home                                       Social Determinants of Health (SDOH) Interventions    Readmission Risk Interventions    06/13/2022    4:04 PM  Readmission Risk Prevention Plan  Transportation Screening Complete  PCP or Specialist Appt within 3-5 Days Complete  HRI or [place] Complete  Social Work Consult for [place] Planning/Counseling Complete  Palliative Care Screening Not Applicable  Medication Review Press photographer) Complete

## 2022-06-14 NOTE — Progress Notes (Signed)
*  PRELIMINARY RESULTS* Echocardiogram 2D Echocardiogram has been performed.  Sherrie Sport 06/14/2022, 11:08 AM

## 2022-06-14 NOTE — Progress Notes (Signed)
Subjective: 4 Days Post-Op Procedure(s) (LRB): ARTHROPLASTY BIPOLAR HIP (HEMIARTHROPLASTY) (Right) Patient reports pain as moderate.   Patient is well, and has had no acute complaints or problems He was able to sleep better last night.  More alert this morning. Current plan is for discharge to [facility], likely today. Negative for chest pain and shortness of breath Fever: no Gastrointestinal:Negative for nausea and vomiting.  Patient has had a small bowel movement.  Objective: Vital signs in last 24 hours: Temp:  [98 F (36.7 C)-98.9 F (37.2 C)] 98 F (36.7 C) (08/07 0357) Pulse Rate:  [65-100] 100 (08/07 0357) Resp:  [16-17] 17 (08/07 0357) BP: (137-195)/(63-92) 158/92 (08/07 0357) SpO2:  [97 %-100 %] 97 % (08/07 0357)  Intake/Output from previous day:  Intake/Output Summary (Last 24 hours) at 06/14/2022 0751 Last data filed at 06/14/2022 0400 Gross per 24 hour  Intake 363.63 ml  Output 4750 ml  Net -4386.37 ml    Intake/Output this shift: No intake/output data recorded.  Labs: Recent Labs    06/12/22 0456 06/13/22 0417  HGB 8.3* 8.6*   Recent Labs    06/12/22 0456 06/13/22 0417  WBC 9.1 7.8  RBC 2.90* 2.99*  HCT 26.7* 27.2*  PLT 118* 120*   Recent Labs    06/13/22 0417 06/14/22 0441  NA 139 136  K 4.7 4.2  CL 112* 105  CO2 21* 24  BUN 54* 55*  CREATININE 2.47* 2.30*  GLUCOSE 126* 126*  CALCIUM 8.4* 8.9   No results for input(s): "LABPT", "INR" in the last 72 hours.    EXAM General - Patient is Alert, Appropriate, and Oriented Extremity - ABD soft Neurovascular intact Dorsiflexion/Plantar flexion intact Incision: scant drainage No cellulitis present Dressing/Incision - Mild bloody drainage noted to the proximal aspect of the incision Motor Function - intact, moving foot and toes well on exam.  Abdomen soft with intact bowel sounds this morning.  Past Medical History:  Diagnosis Date   AAA (abdominal aortic aneurysm) ([place])    a.) s/p EVAR  01/05/2017. b.) native aneurysm sac 6.6 x 6.9 cm by CT on 03/31/2021   Abnormality of tongue    a.) CT head/neck 07/10/2021 --> asymmetric soft tissue at the RIGHT tongue base with a superficial 8 mm lesion.   Anemia    Aneurysm of right common iliac artery (HCC)    a.) measured 2.7 cm by CT on 03/31/2021   Anxiety    Aortic atherosclerosis (HCC)    Atrophic kidney    B12 deficiency    CAD (coronary artery disease)    Cervical radiculopathy    Chronic airway obstruction (HCC)    Chronic kidney disease (CKD), stage III (moderate) (HCC)    Chronic pain syndrome 06/16/2021   Chronic right shoulder pain 11/12/2019   Chronic tension headaches    Chronic, continuous use of opioids 06/16/2021   Coronary artery disease    DDD (degenerative disc disease), lumbar    Degenerative disc disease, lumbar    with lumbar radiculopathy   Elbow fracture, left    GERD (gastroesophageal reflux disease)    H/O adenomatous polyp of colon    H/O hemorrhoids    HTN (hypertension)    Hyperlipidemia    Meralgia paresthetica    Mild cognitive impairment    Nephrolithiasis    Neuralgia    Numbness of right foot 02/19/2021   Osteoarthritis    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   Presence of permanent cardiac  pacemaker    Prostate cancer [facility])    a.) s/p prostatectomy   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   Weight as of this encounter: 95.2 kg. Advance diet Up with therapy D/C IV fluids when tolerating po intake.  Labs reviewed, Hyperkalemia has resolved. Up with therapy today.  Plan for discharge to [place] today. Continue with tramadol and tylenol for discomfort.   Patient has had a small BM. Continue Eliquis for 14 days, upon completion of Eliquis resume routine Plavix prescription.  Plan for discharge to [place] today. Follow-up with [facility] orthopaedics in 10-14 days for staple removal and x-rays of the right hip.  DVT Prophylaxis -  Eliquis, SCDs Weight-Bearing as tolerated to right leg  J. Cameron Proud, PA-C [facility] Orthopaedic Surgery 06/14/2022, 7:51 AM

## 2022-06-14 NOTE — Assessment & Plan Note (Signed)
Started over the last 24 hours.  As needed Thorazine

## 2022-06-14 NOTE — Progress Notes (Signed)
[facility]  ROUNDING NOTE   Subjective:   Rick Mcbride. is a 81 year old male with past medical history including lung cancer, CAD with CABG, CVA, hypertension, and chronic kidney disease stage IV.  Patient presents to the emergency department via EMS after sustaining a fall at home.  Patient has been admitted for Fracture of femoral neck, right, closed ([place]) [S72.001A] Fall, initial encounter [W19.XXXA] Laceration of scalp, initial encounter [S01.01XA] Closed displaced fracture of right femoral neck (HCC) [S72.001A] Chronic kidney disease, unspecified CKD stage [N18.9]  Patient is known to our practice and receives outpatient follow-up by Dr. Juleen China.    Patient sitting up in chair Alert and oriented Pain well managed from procedure Tolerating meals  Creatinine 2.3  UOP 4.75L   Objective:  Vital signs in last 24 hours:  Temp:  [98 F (36.7 C)-99.5 F (37.5 C)] 99.5 F (37.5 C) (08/07 0822) Pulse Rate:  [65-100] 86 (08/07 0822) Resp:  [16-17] 16 (08/07 0822) BP: (137-184)/(63-93) 164/93 (08/07 0822) SpO2:  [97 %-100 %] 97 % (08/07 0822)  Weight change:  Filed Weights   06/10/22 0000 06/10/22 1231  Weight: 95.2 kg 95.2 kg    Intake/Output: I/O last 3 completed shifts: In: 1618.4 [P.O.:360; I.V.:1233; IV Piggyback:25.4] Out: 6950 [Urine:6950]   Intake/Output this shift:  Total I/O In: -  Out: 1400 [Urine:1400]  Physical Exam: General: NAD, sitting up in chair  Head: Normocephalic, atraumatic. Moist oral mucosal membranes  Eyes: Anicteric  Lungs:  Clear to auscultation, normal effort, room air  Heart: Regular rate and rhythm  Abdomen:  Soft, nontender, nondistended  Extremities: Trace peripheral edema.  Neurologic: Nonfocal, moving all four extremities  Skin: No lesions  Access: None    Basic Metabolic Panel: Recent Labs  Lab 06/10/22 0243 06/11/22 0420 06/11/22 0748 06/12/22 0456 06/13/22 0417 06/14/22 0441  NA 140 139  --   139 139 136  K 4.4 5.3* 4.9 5.0 4.7 4.2  CL 109 110  --  110 112* 105  CO2 21* 23  --  22 21* 24  GLUCOSE 124* 156*  --  125* 126* 126*  BUN 31* 38*  --  61* 54* 55*  CREATININE 2.76* 3.01*  --  2.78* 2.47* 2.30*  CALCIUM 8.9 8.2*  --  8.3* 8.4* 8.9     Liver Function Tests: Recent Labs  Lab 06/10/22 0001  AST 19  ALT 20  ALKPHOS 68  BILITOT 0.4  PROT 6.4*  ALBUMIN 3.7    No results for input(s): "LIPASE", "AMYLASE" in the last 168 hours. No results for input(s): "AMMONIA" in the last 168 hours.  CBC: Recent Labs  Lab 06/10/22 0001 06/11/22 0420 06/12/22 0456 06/13/22 0417  WBC 6.6 11.4* 9.1 7.8  NEUTROABS 5.0  --   --   --   HGB 11.5* 9.5* 8.3* 8.6*  HCT 36.9* 30.4* 26.7* 27.2*  MCV 92.7 93.5 92.1 91.0  PLT 167 139* 118* 120*     Cardiac Enzymes: No results for input(s): "CKTOTAL", "CKMB", "CKMBINDEX", "TROPONINI" in the last 168 hours.  BNP: Invalid input(s): "POCBNP"  CBG: No results for input(s): "GLUCAP" in the last 168 hours.  Microbiology: Results for orders placed or performed during the hospital encounter of 06/09/22  Surgical PCR screen     Status: None   Collection Time: 06/10/22  8:15 AM   Specimen: Nasal Mucosa; Nasal Swab  Result Value Ref Range Status   MRSA, PCR NEGATIVE NEGATIVE Final   Staphylococcus aureus NEGATIVE NEGATIVE  Final    Comment: (NOTE) The Xpert SA Assay (FDA approved for NASAL specimens in patients 42 years of age and older), is one component of a comprehensive surveillance program. It is not intended to diagnose infection nor to guide or monitor treatment. Performed at [facility], [address] [place], [place] [address]     Coagulation Studies: No results for input(s): "LABPROT", "INR" in the last 72 hours.   Urinalysis: No results for input(s): "COLORURINE", "LABSPEC", "PHURINE", "GLUCOSEU", "HGBUR", "BILIRUBINUR", "KETONESUR", "PROTEINUR", "UROBILINOGEN", "NITRITE", "LEUKOCYTESUR" in the last  72 hours.  Invalid input(s): "APPERANCEUR"    Imaging: No results found.   Medications:    sodium chloride 10 mL/hr at 06/13/22 2158    acidophilus  1 capsule Oral Daily   apixaban  2.5 mg Oral BID   atorvastatin  40 mg Oral QHS   calcitRIOL  0.25 mcg Oral Daily   calcium-vitamin D  1 tablet Oral Daily   docusate sodium  100 mg Oral BID   donepezil  10 mg Oral QHS   ezetimibe  10 mg Oral Daily   feeding supplement  237 mL Oral BID BM   ferrous sulfate  325 mg Oral BID   furosemide  40 mg Intravenous BID   ipratropium  2 spray Each Nare Q8H   losartan  100 mg Oral Daily   melatonin  10 mg Oral QHS   metoprolol succinate  25 mg Oral Daily   multivitamin with minerals  1 tablet Oral Daily   pantoprazole  40 mg Oral Daily   sodium chloride, acetaminophen, bisacodyl, diphenhydrAMINE, hydrALAZINE, ipratropium-albuterol, magnesium hydroxide, metoCLOPramide **OR** metoCLOPramide (REGLAN) injection, morphine injection, naphazoline-glycerin, ondansetron **OR** ondansetron (ZOFRAN) IV, sodium phosphate, traMADol, zolpidem  Assessment/ Plan:  Rick Mcbride. is a 81 y.o.  male past medical history including lung cancer, CAD with CABG, CVA, hypertension, and chronic kidney disease stage IV.  Patient presents to the emergency department via EMS after sustaining a fall at home.  Patient has been admitted for Fracture of femoral neck, right, closed ([place]) [S72.001A] Fall, initial encounter [W19.XXXA] Laceration of scalp, initial encounter [S01.01XA] Closed displaced fracture of right femoral neck ([place]) [S72.001A] Chronic kidney disease, unspecified CKD stage [N18.9]   Chronic kidney disease stage IV with baseline creatinine 2.66 and GFR 23 on 03/15/2022.  Chronic kidney disease secondary to chronic NSAID use, hypertension, and age.  Receives regular follow-up outpatient with our office.   Renal function better than baseline. Continue to avoid nephrotoxic agents and therapies.   Lab  Results  Component Value Date   CREATININE 2.30 (H) 06/14/2022   CREATININE 2.47 (H) 06/13/2022   CREATININE 2.78 (H) 06/12/2022    Intake/Output Summary (Last 24 hours) at 06/14/2022 1514 Last data filed at 06/14/2022 1241 Gross per 24 hour  Intake 363.63 ml  Output 3750 ml  Net -3386.37 ml    2. Anemia of chronic kidney disease Lab Results  Component Value Date   HGB 8.6 (L) 06/13/2022    Hemoglobin below target but improving. We will continue to monitor  3.  Hypertension with chronic kidney disease.  Home regimen includes losartan and metoprolol.  Currently receiving these medications. Now also receiving ezetimibe and furosemide.  Blood pressure 164/93 today  4. Secondary Hyperparathyroidism: with outpatient labs: PTH 170, phosphorus 4.3, calcium 9.5 on 03/15/2022.   Lab Results  Component Value Date   CALCIUM 8.9 06/14/2022  Patient receives calcitriol and cholecalciferol outpatient.  5.  Closed right femoral neck fracture,  status post fall.  Orthopedic surgery consulted.  Right hip bipolar hemiarthroplasty on 06/10/22.  Pain control and physical therapy.    LOS: 4   8/7/20233:14 PM

## 2022-06-14 NOTE — Progress Notes (Addendum)
[facility] Progress Note  Patient: Rick Mcbride.    VOZ:[id number]  DOA: 06/09/2022    Date of Service: the patient was seen and examined on 06/14/2022  Brief hospital course: 81 year old male with past medical history of hypertension, lung cancer status post lobectomy, cancer of the base of the tongue status post radiation therapy which completed in February of this year, CAD status post CABG, stage IV chronic kidney disease, secondary AV block status post pacemaker, mild cognitive deficit who presented to the emergency room on the early morning of 8/3 after he sustained a accidental fall while going to the bathroom.  Patient fell hitting his head, but did not lose consciousness, but he was unable to stand.  The emergency room, patient found to have a creatinine of 2.96 (baseline of 2.03) and x-rays noted a displaced right femoral neck fracture.  Patient was admitted to the hospitalist service and orthopedics were consulted who plan to take patient to the operating room on afternoon of 8/3.  Postop, patient with blood loss anemia from surgery.  Was receiving hydration for renal function.  Patient blood pressure has been trending up slowly since hospitalization.  On 8/6, BNP checked and found to be elevated at 1500.  No history of CHF.  Fluids stopped and started on IV Lasix.  Echocardiogram ordered.  Seen by PT and OT who are recommending skilled nursing.  Assessment and Plan: Assessment and Plan: * Fracture of femoral neck, right, closed (HCC) Moderate risk given comorbidities, but overall stable for surgery.  Kept NPO.  Held aspirin and Plavix.  Morphine for pain control NPO.  Status post hip repair.  Eliquis for DVT prophylaxis.  Follow-up with orthopedic surgery in 10 to 14 days.  For skilled nursing.   Acute CHF (congestive heart failure) (HCC) No previous history.  Checking echocardiogram.  Initially received IV fluids secondary to acute kidney injury.  Since then has diuresed over  a liter in the last 24 hours.  Acute postoperative anemia due to expected blood loss Hemoglobin initially 11.5, but that was in the setting of acute kidney injuries, so likely lower than this due to hemoconcentration.  Following expected blood loss anemia from surgery as well as hydration, hemoglobin down as low as 8.3 and has since stabilized.  Acute renal failure superimposed on stage 4 chronic kidney disease [facility]) Nephrology consulted.  Creatinine 3-1/2 years ago at 2.03 with a GFR of 36.  Presented to the emergency room with creatinine of 2.96 and GFR of 21.  Patient aggressively fluid resuscitated and then.  Once volume overloaded and started on Lasix.  Creatinine down to 2.3 today with GFR of 28.  Hiccups Started over the last 24 hours.  As needed Thorazine  Thoracic aortic aneurysm without rupture [facility]) AAA s/p endovascular repair Followed by vascular surgeon, Dr. Lucky Cowboy On Plavix, aspirin and atorvastatin Patient denies abdominal pain or chest pain Holding Plavix and aspirin for surgery  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[place]) Completed XRT in February 2023  Presence of permanent cardiac pacemaker Gets scheduled pacemaker checks.  No acute issues suspected  CAD S/P CABG x 3 Patient denies chest pain or shortness of breath.  Has good functional capacity Last seen by his cardiologist in March 2023 Currently on aspirin and atorvastatin and metoprolol   Cancer of lung ([place]) S/p lobectomy.  Follows with oncology  Mild cognitive impairment Continue Aricept Delirium precautions  Essential hypertension Blood pressure has been trending upward, secondary to volume overload.  Should improve now that he is getting consistent Lasix and fluids stopped.  Overweight (BMI 25.0-29.9) Meets criteria BMI greater than 25       Body mass index is 29.27 kg/m.  Nutrition Problem: Increased nutrient needs Etiology: post-op healing     Consultants: Orthopedic  surgery Nephrology  Procedures: Status post hip fracture repair 8/3  Antimicrobials: Preop Ancef  Code Status: Full code   Subjective: Feels very tired  Objective: Blood pressures are elevated, improved from previous day Vitals:   06/14/22 0357 06/14/22 0822  BP: (!) 158/92 (!) 164/93  Pulse: 100 86  Resp: 17 16  Temp: 98 F (36.7 C) 99.5 F (37.5 C)  SpO2: 97% 97%    Intake/Output Summary (Last 24 hours) at 06/14/2022 1546 Last data filed at 06/14/2022 1241 Gross per 24 hour  Intake 363.63 ml  Output 3750 ml  Net -3386.37 ml   Filed Weights   06/10/22 0000 06/10/22 1231  Weight: 95.2 kg 95.2 kg   Body mass index is 29.27 kg/m.  Exam: About the same from previous day General: Alert and oriented x2, fatigued HEENT: Normocephalic, atraumatic, mucous membranes slightly dry Cardiovascular: Regular rate and rhythm, S1-S2 Respiratory: Decreased breath sounds throughout Abdomen: Soft, nontender, nondistended, positive bowel sounds Musculoskeletal: No clubbing or cyanosis or edema Skin: No skin breaks, tears or lesions Psychiatry: Appropriate, no evidence of psychoses Neurology: No focal deficits  Data Reviewed: Creatinine down to 2.3  Disposition:  Status is: Inpatient Remains inpatient appropriate because: Short-term skilled nursing once fully diuresed  Anticipated discharge date: 8/9  Family Communication: Left message for family DVT Prophylaxis: apixaban (ELIQUIS) tablet 2.5 mg Start: 06/11/22 1000 SCDs Start: 06/10/22 1739 apixaban (ELIQUIS) tablet 2.5 mg    Author: Annita Brod ,MD 06/14/2022 3:46 PM  To reach On-call, see care teams to locate the attending and reach out via [email or web address]. Between 7PM-7AM, please contact night-coverage If you still have difficulty reaching the attending provider, please page the [facility] (Director on Call) for [facility] on amion for assistance.

## 2022-06-15 DIAGNOSIS — W19XXXD Unspecified fall, subsequent encounter: Secondary | ICD-10-CM | POA: Diagnosis not present

## 2022-06-15 DIAGNOSIS — I5032 Chronic diastolic (congestive) heart failure: Secondary | ICD-10-CM | POA: Diagnosis not present

## 2022-06-15 DIAGNOSIS — M6259 Muscle wasting and atrophy, not elsewhere classified, multiple sites: Secondary | ICD-10-CM | POA: Diagnosis not present

## 2022-06-15 DIAGNOSIS — R197 Diarrhea, unspecified: Secondary | ICD-10-CM | POA: Diagnosis not present

## 2022-06-15 DIAGNOSIS — I2510 Atherosclerotic heart disease of native coronary artery without angina pectoris: Secondary | ICD-10-CM | POA: Diagnosis not present

## 2022-06-15 DIAGNOSIS — R4789 Other speech disturbances: Secondary | ICD-10-CM | POA: Diagnosis not present

## 2022-06-15 DIAGNOSIS — I700 Atherosclerosis of aorta: Secondary | ICD-10-CM | POA: Diagnosis not present

## 2022-06-15 DIAGNOSIS — R2681 Unsteadiness on feet: Secondary | ICD-10-CM | POA: Diagnosis not present

## 2022-06-15 DIAGNOSIS — D62 Acute posthemorrhagic anemia: Secondary | ICD-10-CM | POA: Diagnosis not present

## 2022-06-15 DIAGNOSIS — Z950 Presence of cardiac pacemaker: Secondary | ICD-10-CM | POA: Diagnosis not present

## 2022-06-15 DIAGNOSIS — N390 Urinary tract infection, site not specified: Secondary | ICD-10-CM | POA: Diagnosis not present

## 2022-06-15 DIAGNOSIS — Z741 Need for assistance with personal care: Secondary | ICD-10-CM | POA: Diagnosis not present

## 2022-06-15 DIAGNOSIS — D649 Anemia, unspecified: Secondary | ICD-10-CM | POA: Diagnosis not present

## 2022-06-15 DIAGNOSIS — E663 Overweight: Secondary | ICD-10-CM | POA: Diagnosis not present

## 2022-06-15 DIAGNOSIS — D72829 Elevated white blood cell count, unspecified: Secondary | ICD-10-CM | POA: Diagnosis not present

## 2022-06-15 DIAGNOSIS — Z79899 Other long term (current) drug therapy: Secondary | ICD-10-CM | POA: Diagnosis not present

## 2022-06-15 DIAGNOSIS — R945 Abnormal results of liver function studies: Secondary | ICD-10-CM | POA: Diagnosis not present

## 2022-06-15 DIAGNOSIS — E611 Iron deficiency: Secondary | ICD-10-CM | POA: Diagnosis not present

## 2022-06-15 DIAGNOSIS — Z85810 Personal history of malignant neoplasm of tongue: Secondary | ICD-10-CM | POA: Diagnosis not present

## 2022-06-15 DIAGNOSIS — E559 Vitamin D deficiency, unspecified: Secondary | ICD-10-CM | POA: Diagnosis not present

## 2022-06-15 DIAGNOSIS — C01 Malignant neoplasm of base of tongue: Secondary | ICD-10-CM | POA: Diagnosis not present

## 2022-06-15 DIAGNOSIS — G3184 Mild cognitive impairment, so stated: Secondary | ICD-10-CM | POA: Diagnosis not present

## 2022-06-15 DIAGNOSIS — I5031 Acute diastolic (congestive) heart failure: Secondary | ICD-10-CM

## 2022-06-15 DIAGNOSIS — E785 Hyperlipidemia, unspecified: Secondary | ICD-10-CM | POA: Diagnosis not present

## 2022-06-15 DIAGNOSIS — S72001A Fracture of unspecified part of neck of right femur, initial encounter for closed fracture: Secondary | ICD-10-CM | POA: Diagnosis not present

## 2022-06-15 DIAGNOSIS — M6281 Muscle weakness (generalized): Secondary | ICD-10-CM | POA: Diagnosis not present

## 2022-06-15 DIAGNOSIS — I951 Orthostatic hypotension: Secondary | ICD-10-CM | POA: Diagnosis not present

## 2022-06-15 DIAGNOSIS — I509 Heart failure, unspecified: Secondary | ICD-10-CM | POA: Diagnosis not present

## 2022-06-15 DIAGNOSIS — C3490 Malignant neoplasm of unspecified part of unspecified bronchus or lung: Secondary | ICD-10-CM | POA: Diagnosis not present

## 2022-06-15 DIAGNOSIS — N179 Acute kidney failure, unspecified: Secondary | ICD-10-CM | POA: Diagnosis not present

## 2022-06-15 DIAGNOSIS — E119 Type 2 diabetes mellitus without complications: Secondary | ICD-10-CM | POA: Diagnosis not present

## 2022-06-15 DIAGNOSIS — N184 Chronic kidney disease, stage 4 (severe): Secondary | ICD-10-CM | POA: Diagnosis not present

## 2022-06-15 DIAGNOSIS — R41841 Cognitive communication deficit: Secondary | ICD-10-CM | POA: Diagnosis not present

## 2022-06-15 DIAGNOSIS — J449 Chronic obstructive pulmonary disease, unspecified: Secondary | ICD-10-CM | POA: Diagnosis not present

## 2022-06-15 DIAGNOSIS — Z923 Personal history of irradiation: Secondary | ICD-10-CM | POA: Diagnosis not present

## 2022-06-15 DIAGNOSIS — Z7401 Bed confinement status: Secondary | ICD-10-CM | POA: Diagnosis not present

## 2022-06-15 DIAGNOSIS — S72009D Fracture of unspecified part of neck of unspecified femur, subsequent encounter for closed fracture with routine healing: Secondary | ICD-10-CM | POA: Diagnosis not present

## 2022-06-15 DIAGNOSIS — S72001D Fracture of unspecified part of neck of right femur, subsequent encounter for closed fracture with routine healing: Secondary | ICD-10-CM | POA: Diagnosis not present

## 2022-06-15 DIAGNOSIS — I959 Hypotension, unspecified: Secondary | ICD-10-CM | POA: Diagnosis not present

## 2022-06-15 DIAGNOSIS — K219 Gastro-esophageal reflux disease without esophagitis: Secondary | ICD-10-CM | POA: Diagnosis not present

## 2022-06-15 DIAGNOSIS — I10 Essential (primary) hypertension: Secondary | ICD-10-CM | POA: Diagnosis not present

## 2022-06-15 LAB — ECHOCARDIOGRAM COMPLETE
AR max vel: 2.19 cm2
AV Area VTI: 2.26 cm2
AV Area mean vel: 2.06 cm2
AV Mean grad: 2 mmHg
AV Peak grad: 3.4 mmHg
Ao pk vel: 0.92 m/s
Area-P 1/2: 5.7 cm2
Height: 71 in
S' Lateral: 3 cm
Weight: 3358.05 oz

## 2022-06-15 LAB — BASIC METABOLIC PANEL
Anion gap: 10 (ref 5–15)
BUN: 65 mg/dL — ABNORMAL HIGH (ref 8–23)
CO2: 23 mmol/L (ref 22–32)
Calcium: 9.3 mg/dL (ref 8.9–10.3)
Chloride: 105 mmol/L (ref 98–111)
Creatinine, Ser: 2.39 mg/dL — ABNORMAL HIGH (ref 0.61–1.24)
GFR, Estimated: 27 mL/min — ABNORMAL LOW (ref >60)
Glucose, Bld: 136 mg/dL — ABNORMAL HIGH (ref 70–99)
Potassium: 4.2 mmol/L (ref 3.5–5.1)
Sodium: 138 mmol/L (ref 135–145)

## 2022-06-15 LAB — CBC
HCT: 30.2 % — ABNORMAL LOW (ref 39.0–52.0)
Hemoglobin: 9.7 g/dL — ABNORMAL LOW (ref 13.0–17.0)
MCH: 28.4 pg (ref 26.0–34.0)
MCHC: 32.1 g/dL (ref 30.0–36.0)
MCV: 88.3 fL (ref 80.0–100.0)
Platelets: 172 10*3/uL (ref 150–400)
RBC: 3.42 MIL/uL — ABNORMAL LOW (ref 4.22–5.81)
RDW: 15.6 % — ABNORMAL HIGH (ref 11.5–15.5)
WBC: 8.8 10*3/uL (ref 4.0–10.5)
nRBC: 0 % (ref 0.0–0.2)

## 2022-06-15 MED ORDER — POLYETHYLENE GLYCOL 3350 17 G PO PACK: 17.0000 g | PACK | Freq: Every day | ORAL | 0 refills | Status: DC | PRN

## 2022-06-15 MED ORDER — METHOCARBAMOL 500 MG PO TABS
500.0000 mg | ORAL_TABLET | Freq: Every evening | ORAL | Status: DC | PRN
Start: 2022-06-15 — End: 2023-01-06

## 2022-06-15 MED ORDER — CHLORPROMAZINE HCL 10 MG PO TABS: 10.0000 mg | ORAL_TABLET | Freq: Four times a day (QID) | ORAL | 0 refills | Status: DC

## 2022-06-15 MED ORDER — ZOLPIDEM TARTRATE 10 MG PO TABS: 10.0000 mg | ORAL_TABLET | Freq: Every day | ORAL | 0 refills | Status: DC

## 2022-06-15 MED ORDER — METOCLOPRAMIDE HCL 5 MG PO TABS: 5.0000 mg | ORAL_TABLET | Freq: Three times a day (TID) | ORAL | 0 refills | Status: DC

## 2022-06-15 MED ORDER — METOCLOPRAMIDE HCL 5 MG/ML IJ SOLN
5.0000 mg | Freq: Three times a day (TID) | INTRAMUSCULAR | Status: DC
  Administered 2022-06-15: 5 mg via INTRAVENOUS
  Filled 2022-06-15: qty 2

## 2022-06-15 MED ORDER — ENSURE ENLIVE PO LIQD: 237.0000 mL | Freq: Two times a day (BID) | ORAL | 12 refills | Status: DC

## 2022-06-15 NOTE — Care Management Important Message (Signed)
Important Message  Patient Details  Name: Rick Mcbride. MRN: [id number] Date of Birth: Sep 03, 1941   Medicare Important Message Given:  Yes     Juliann Pulse A Aundray Cartlidge 06/15/2022, 11:59 AM

## 2022-06-15 NOTE — Plan of Care (Signed)

## 2022-06-15 NOTE — TOC Transition Note (Signed)
Transition of Care [facility] - CM/SW Discharge Note   Patient Details  Name: Rick Mcbride. MRN: [id number] Date of Birth: 1941/09/04  Transition of Care [facility]) CM/SW Contact:  Laurena Slimmer, RN Phone Number: 06/15/2022, 2:50 PM   Clinical Narrative:    Damaris Schooner with Levi Aland from [facility] to confirm facility admission today. Room assignment provided and where to call report. Called  wife to verify if patient would be transferred by car or ambulance. Wife stated she would not be able to transport him.   Contacted Connie at [facility] to get authorization for ambulance.      Barriers to Discharge: Continued Medical Work up   Patient Goals and CMS Choice Patient states their goals for this hospitalization and ongoing recovery are:: SNF CMS Medicare.gov Compare Post Acute Care list provided to:: Patient Choice offered to / list presented to : Patient  Discharge Placement                       Discharge Plan and Services                                     Social Determinants of Health (SDOH) Interventions     Readmission Risk Interventions    06/13/2022    4:04 PM  Readmission Risk Prevention Plan  Transportation Screening Complete  PCP or Specialist Appt within 3-5 Days Complete  HRI or [place] Complete  Social Work Consult for [place] Planning/Counseling Complete  Palliative Care Screening Not Applicable  Medication Review Press photographer) Complete

## 2022-06-15 NOTE — Plan of Care (Signed)
  Problem: Education: Goal: Knowledge of General Education information will improve Description: Including pain rating scale, medication(s)/side effects and non-pharmacologic comfort measures Outcome: Progressing   Problem: Clinical Measurements: Goal: Ability to maintain clinical measurements within normal limits will improve Outcome: Progressing Goal: Will remain free from infection Outcome: Progressing Goal: Diagnostic test results will improve Outcome: Progressing Goal: Respiratory complications will improve Outcome: Progressing Goal: Cardiovascular complication will be avoided Outcome: Progressing   Problem: Activity: Goal: Risk for activity intolerance will decrease Outcome: Progressing   Problem: Nutrition: Goal: Adequate nutrition will be maintained Outcome: Progressing   Problem: Coping: Goal: Level of anxiety will decrease Outcome: Progressing   Problem: Elimination: Goal: Will not experience complications related to bowel motility Outcome: Progressing Goal: Will not experience complications related to urinary retention Outcome: Progressing   Problem: Pain Managment: Goal: General experience of comfort will improve Outcome: Progressing   Problem: Safety: Goal: Ability to remain free from injury will improve Outcome: Progressing   Problem: Skin Integrity: Goal: Risk for impaired skin integrity will decrease Outcome: Progressing   Problem: Education: Goal: Verbalization of understanding the information provided (i.e., activity precautions, restrictions, etc) will improve Outcome: Progressing Goal: Individualized Educational Video(s) Outcome: Progressing   Problem: Activity: Goal: Ability to ambulate and perform ADLs will improve Outcome: Progressing   Problem: Clinical Measurements: Goal: Postoperative complications will be avoided or minimized Outcome: Progressing   Problem: Self-Concept: Goal: Ability to maintain and perform role  responsibilities to the fullest extent possible will improve Outcome: Progressing   Problem: Pain Management: Goal: Pain level will decrease Outcome: Progressing   Problem: Health Behavior/Discharge Planning: Goal: Ability to manage health-related needs will improve Outcome: Not Progressing

## 2022-06-15 NOTE — Progress Notes (Signed)
Nutrition Follow-up  DOCUMENTATION CODES:   Not applicable  INTERVENTION:   -Continue MVI with minerals daily -Continue Ensure Enlive po BID, each supplement provides 350 kcal and 20 grams of protein  NUTRITION DIAGNOSIS:   Increased nutrient needs related to post-op healing as evidenced by estimated needs.  Ongoing  GOAL:   Patient will meet greater than or equal to 90% of their needs  Progressing   MONITOR:   PO intake, Supplement acceptance  REASON FOR ASSESSMENT:   Consult Assessment of nutrition requirement/status, Hip fracture protocol  ASSESSMENT:   Ptwith medical history significant for HTN, lung cancer s/p lobectomy, cancer of the base of the tongue completed XRT 12/2021, CVA, CAD s/p CABG, CKD 4, endovascular AAA repair 2018, spinal fusion on chronic opiates, CVA without residual deficit, pacemaker secondary to second-degree AV block, mild cognitive deficit, who presents following an accidental fall when he tripped after getting up from bed to go to the bathroom.  8/3- s/p Right hip bipolar hemiarthroplasty  Reviewed I/O's: -2.8 L x 24 hours and -6 L since admission  UOP: 2.8 L x 24 hours   Pt unavailable at time of visit. Attempted to speak with pt via call to hospital room phone, however, unable to reach.  Pt currently on a heart healthy, carb modified diet. Noted meal completions 100%. Pt is also consuming Ensure supplements.   Per MD notes, plan for discharge to SNF today.   Medications reviewed and include calcium-vitamin D, colace, and melatonin.    Labs reviewed: CBGS: 88.  Diet Order:   Diet Order             Diet heart healthy/carb modified Room service appropriate? Yes; Fluid consistency: Thin  Diet effective now                   EDUCATION NEEDS:   Education needs have been addressed  Skin:  Skin Assessment: Reviewed RN Assessment  Last BM:  06/09/22  Height:   Ht Readings from Last 1 Encounters:  06/10/22 5\' 11"  (1.803 m)     Weight:   Wt Readings from Last 1 Encounters:  06/10/22 95.2 kg    Ideal Body Weight:  78.2 kg  BMI:  Body mass index is 29.27 kg/m.  Estimated Nutritional Needs:   Kcal:  2150-2350  Protein:  105-120 grams  Fluid:  > 2 L    Loistine Chance, RD, LDN, [place] Registered Dietitian II Certified Diabetes Care and Education Specialist Please refer to [facility] for RD and/or RD on-call/weekend/after hours pager

## 2022-06-15 NOTE — Discharge Summary (Signed)
Physician Discharge Summary   Patient: Rick Mcbride. MRN: [id number] DOB: 09/14/1941  Admit date:     06/09/2022  Discharge date: 06/15/22  Discharge Physician: Annita Brod   PCP: Idelle Crouch, MD   Recommendations at discharge:   New medication: Eliquis 2.5 mg p.o. twice daily x15 days Medication change: Patient normally on Plavix 75 mg p.o. daily.  This medication is on hold and will be resumed after Eliquis is complete New medication: Ensure p.o. twice daily between meals New medication: Ultram 50 mg p.o. every 6 hours as needed for pain New medication: Thorazine 10 mg p.o. 4 times a day x2 days New medication: Reglan 5 mg p.o. 3 times daily x2 days New medication: Zofran 4 mg every 6 hours as needed for nausea New medication: MiraLAX 17 g p.o. daily as needed for constipation Patient being discharged to skilled nursing Patient will follow-up with [name], orthopedics in 2 weeks for staple removal and wound check  Discharge Diagnoses: Principal Problem:   Fracture of femoral neck, right, closed ([place]) Active Problems:   Acute diastolic heart failure ([place])   Acute postoperative anemia due to expected blood loss   Acute renal failure superimposed on stage 4 chronic kidney disease (HCC)   Hiccups   Thoracic aortic aneurysm without rupture ([place])   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[place])   Presence of permanent cardiac pacemaker   CAD S/P CABG x 3   Cancer of lung (HCC)   Mild cognitive impairment   Essential hypertension   Overweight (BMI 25.0-29.9)  Resolved Problems:   * No resolved hospital problems. *  Hospital Course: 81 year old male with past medical history of hypertension, lung cancer status post lobectomy, cancer of the base of the tongue status post radiation therapy which completed in February of this year, CAD status post CABG, stage IV chronic kidney disease, secondary AV block status post pacemaker, mild cognitive deficit who  presented to the emergency room on the early morning of 8/3 after he sustained a accidental fall while going to the bathroom.  Patient fell hitting his head, but did not lose consciousness, but he was unable to stand.  The emergency room, patient found to have a creatinine of 2.96 (baseline of 2.03) and x-rays noted a displaced right femoral neck fracture.  Patient was admitted to the hospitalist service and orthopedics were consulted who plan to take patient to the operating room on afternoon of 8/3.  Postop, patient with blood loss anemia from surgery.  Was receiving hydration for renal function.  Patient blood pressure has been trending up slowly since hospitalization.  On 8/6, BNP checked and found to be elevated at 1500.  No history of CHF.  Fluids stopped and started on IV Lasix.  Echocardiogram noted grade 1 diastolic dysfunction.  By 8/8, felt to be fully diuresed and blood pressure stabilized.  Seen by PT and OT who are recommending skilled nursing.  Assessment and Plan: * Fracture of femoral neck, right, closed ([place]) Status post hip repair.  Eliquis for DVT prophylaxis.  (Will stop Plavix while he is on Eliquis) follow-up with orthopedic surgery in 2 weeks.  For skilled nursing.  Acute diastolic heart failure (HCC) No previous history.  Echocardiogram noted grade 1 diastolic dysfunction.  Initially received IV fluids secondary to acute kidney injury.  Following elevation of blood pressure, fluids stopped and patient started on IV Lasix.  By day of discharge, had diuresed over 11 L and was -6  L deficient.  Lasix discontinued.  Acute postoperative anemia due to expected blood loss Hemoglobin initially 11.5, but that was in the setting of acute kidney injuries, so likely lower than this due to hemoconcentration.  Following expected blood loss anemia from surgery as well as hydration, hemoglobin down as low as 8.3 and has since stabilized.  Hemoglobin on day of discharge at 9.7.  Acute renal  failure superimposed on stage 4 chronic kidney disease [facility]) Nephrology consulted.  Creatinine 3-1/2 years ago at 2.03 with a GFR of 36.  Presented to the emergency room with creatinine of 2.96 and GFR of 21.  Patient aggressively fluid resuscitated and then.  Once volume overloaded and started on Lasix.  Creatinine at 2.39 with GFR of 27 on day of discharge  Hiccups Persistent.  The patient started on scheduled Thorazine we will continue Thorazine 10 mg 4 times a day for 2 days.  With hiccups improving although still persisting also given some Reglan we will continue Reglan 5 mg p.o. 3 times daily x2 days.  Thoracic aortic aneurysm without rupture [facility]) AAA s/p endovascular repair Followed by vascular surgeon, Dr. 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  No acute issues suspected  CAD S/P CABG x 3 Patient denies chest pain or shortness of breath.  Has good functional capacity Last seen by his cardiologist in March 2023 Currently on aspirin and atorvastatin and metoprolol   Cancer of lung ([place]) S/p lobectomy.  Follows with oncology  Mild cognitive impairment Continue Aricept Delirium precautions  Essential hypertension Blood pressure has been trending upward, secondary to volume overload.  Improved significantly and now stable following Lasix  Overweight (BMI 25.0-29.9) Meets criteria BMI greater than 25        Pain control - [place] Controlled Substance Reporting System database was reviewed. and patient was instructed, not to drive, operate heavy machinery, perform activities at heights, swimming or participation in water activities or provide baby-sitting services while on Pain, Sleep and Anxiety Medications; until their  outpatient Physician has advised to do so again. Also recommended to not to take more than prescribed Pain, Sleep and Anxiety Medications.  Consultants: Orthopedic surgery Procedures performed: Echocardiogram noting grade 1 diastolic dysfunction Status post hip fracture repair  Disposition: Skilled nursing facility Diet recommendation:  Discharge Diet Orders (From admission, onward)     Start     Ordered   06/15/22 0000  Diet - low sodium heart healthy        06/15/22 1125           Heart healthy DISCHARGE MEDICATION: Allergies as of 06/15/2022   No Known Allergies      Medication List     STOP taking these medications    ipratropium 0.06 % nasal spray Commonly known as: ATROVENT       TAKE these medications    acetaminophen 500 MG tablet Commonly known as: TYLENOL Take 500 mg by mouth every 4 (four) hours as needed for moderate pain.   apixaban 2.5 MG Tabs tablet Commonly known as: ELIQUIS Take 1 tablet (2.5 mg total) by mouth 2 (two) times daily.   aspirin EC 81 MG tablet Take 81 mg by mouth daily.   atorvastatin 20 MG tablet Commonly known as: LIPITOR Take 40 mg by mouth  at bedtime.   calcitRIOL 0.25 MCG capsule Commonly known as: ROCALTROL Take 0.25 mcg by mouth daily.   CALCIUM 600 + D PO Take 1 tablet by mouth daily.   cetirizine 10 MG tablet Commonly known as: ZYRTEC Take 10 mg by mouth at bedtime.   chlorproMAZINE 10 MG tablet Commonly known as: THORAZINE Take 1 tablet (10 mg total) by mouth 4 (four) times daily for 2 days.   clopidogrel 75 MG tablet Commonly known as: PLAVIX Take 1 tablet (75 mg total) by mouth daily. Restart Plavix after completion of Eliquis prescription. What changed: additional instructions   docusate sodium 100 MG capsule Commonly known as: COLACE Take 300 mg by mouth at bedtime.   donepezil 10 MG tablet Commonly known as: ARICEPT Take 10 mg by mouth at bedtime.   ezetimibe 10 MG tablet Commonly known as:  ZETIA Take 10 mg by mouth daily.   feeding supplement Liqd Take 237 mLs by mouth 2 (two) times daily between meals.   Iron Slow Release 140 (45 Fe) MG Tbcr Generic drug: Ferrous Sulfate Take 1 tablet by mouth 2 (two) times daily.   loratadine 10 MG tablet Commonly known as: CLARITIN Take 10 mg by mouth daily.   losartan 100 MG tablet Commonly known as: COZAAR Take 100 mg by mouth daily.   melatonin 5 MG Tabs Take 10 mg by mouth at bedtime.   methocarbamol 500 MG tablet Commonly known as: ROBAXIN Take 1 tablet (500 mg total) by mouth at bedtime as needed for muscle spasms. What changed:  when to take this reasons to take this   metoCLOPramide 5 MG tablet Commonly known as: Reglan Take 1 tablet (5 mg total) by mouth 3 (three) times daily for 2 days.   metoprolol succinate 25 MG 24 hr tablet Commonly known as: TOPROL-XL Take 25 mg by mouth daily.   multivitamin with minerals Tabs tablet Take 1 tablet by mouth daily.   ondansetron 4 MG tablet Commonly known as: ZOFRAN Take 1 tablet (4 mg total) by mouth every 6 (six) hours as needed for nausea.   pantoprazole 40 MG tablet Commonly known as: PROTONIX Take 40 mg by mouth daily.   polyethylene glycol 17 g packet Commonly known as: MiraLax Take 17 g by mouth daily as needed for mild constipation.   PreserVision AREDS 2 Caps Take 1 tablet by mouth 2 (two) times daily.   PROBIOTIC PO Take 1 capsule by mouth daily.   traMADol 50 MG tablet Commonly known as: ULTRAM Take 1 tablet (50 mg total) by mouth every 6 (six) hours as needed for moderate pain. What changed: reasons to take this   zolpidem 10 MG tablet Commonly known as: AMBIEN Take 1 tablet (10 mg total) by mouth at bedtime.               Discharge Care Instructions  (From admission, onward)           Start     Ordered   06/15/22 0000  Discharge wound care:       Comments: Reinforce dressing   06/15/22 1125            Follow-up  Information     Lattie Corns, PA-C Follow up in 14 day(s).   Specialty: Physician Assistant Why: Electa Sniff information: [place] [address] [phone]                Discharge Exam: [place] [facility]   06/10/22 0000  06/10/22 1231  Weight: 95.2 kg 95.2 kg   General: Alert and oriented x2, no acute distress Cardiovascular: Regular rate and rhythm, S1-S2 Lungs: Clear to auscultation bilaterally  Condition at discharge: good  The results of significant diagnostics from this hospitalization (including imaging, microbiology, ancillary and laboratory) are listed below for reference.   Imaging Studies: ECHOCARDIOGRAM COMPLETE  Result Date: 06/15/2022    ECHOCARDIOGRAM REPORT   Patient Name:   Rick Mcbride. Date of Exam: 06/14/2022 Medical Rec #:  [id number]             Height:       71.0 in Accession #:    [id number]            Weight:       209.9 lb Date of Birth:  1941-03-04             BSA:          2.152 m Patient Age:    22 years              BP:           164/93 mmHg Patient Gender: M                     HR:           86 bpm. Exam Location:  [facility] Procedure: 2D Echo, Cardiac Doppler, Color Doppler and Intracardiac            Opacification Agent Indications:     CHF-acute diastolic H03.88  History:         Patient has no prior history of Echocardiogram examinations.                  Prior CABG; Risk Factors:Hypertension.  Sonographer:     Sherrie Sport Referring Phys:  [place] Diagnosing Phys: Neoma Laming  Sonographer Comments: Technically difficult study due to poor echo windows, suboptimal parasternal window, suboptimal apical window and no subcostal window. IMPRESSIONS  1. Left ventricular ejection fraction, by estimation, is 60 to 65%. The left ventricle has normal function. The left ventricle has no regional wall motion abnormalities. Left ventricular diastolic parameters are consistent with Grade I diastolic dysfunction  (impaired relaxation).  2. Right ventricular systolic function is normal. The right ventricular size is normal.  3. The mitral valve is normal in structure. No evidence of mitral valve regurgitation. No evidence of mitral stenosis.  4. The aortic valve is normal in structure. Aortic valve regurgitation is not visualized. Aortic valve sclerosis is present, with no evidence of aortic valve stenosis.  5. The inferior vena cava is normal in size with greater than 50% respiratory variability, suggesting right atrial pressure of 3 mmHg. FINDINGS  Left Ventricle: Left ventricular ejection fraction, by estimation, is 60 to 65%. The left ventricle has normal function. The left ventricle has no regional wall motion abnormalities. Definity contrast agent was given IV to delineate the left ventricular  endocardial borders. The left ventricular internal cavity size was normal in size. There is no left ventricular hypertrophy. Left ventricular diastolic parameters are consistent with Grade I diastolic dysfunction (impaired relaxation). Right Ventricle: The right ventricular size is normal. No increase in right ventricular wall thickness. Right ventricular systolic function is normal. Left Atrium: Left atrial size was normal in size. Right Atrium: Right atrial size was normal in size. Pericardium: There is no evidence of pericardial effusion. Mitral Valve: The mitral valve is normal in structure.  No evidence of mitral valve regurgitation. No evidence of mitral valve stenosis. Tricuspid Valve: The tricuspid valve is normal in structure. Tricuspid valve regurgitation is not demonstrated. No evidence of tricuspid stenosis. Aortic Valve: The aortic valve is normal in structure. Aortic valve regurgitation is not visualized. Aortic valve sclerosis is present, with no evidence of aortic valve stenosis. Aortic valve mean gradient measures 2.0 mmHg. Aortic valve peak gradient measures 3.4 mmHg. Aortic valve area, by VTI measures 2.26 cm.  Pulmonic Valve: The pulmonic valve was normal in structure. Pulmonic valve regurgitation is not visualized. No evidence of pulmonic stenosis. Aorta: The aortic root is normal in size and structure. Venous: The inferior vena cava is normal in size with greater than 50% respiratory variability, suggesting right atrial pressure of 3 mmHg. IAS/Shunts: No atrial level shunt detected by color flow Doppler.  LEFT VENTRICLE PLAX 2D LVIDd:         4.10 cm   Diastology LVIDs:         3.00 cm   LV e' medial:    4.35 cm/s LV PW:         1.10 cm   LV E/e' medial:  8.9 LV IVS:        1.10 cm   LV e' lateral:   6.74 cm/s LVOT diam:     2.00 cm   LV E/e' lateral: 5.7 LV SV:         27 LV SV Index:   13 LVOT Area:     3.14 cm  RIGHT VENTRICLE RV Basal diam:  3.50 cm RV S prime:     11.20 cm/s LEFT ATRIUM           Index        RIGHT ATRIUM           Index LA diam:      2.90 cm 1.35 cm/m   RA Area:     27.40 cm LA Vol (A2C): 52.6 ml 24.44 ml/m  RA Volume:   90.80 ml  42.19 ml/m LA Vol (A4C): 74.8 ml 34.76 ml/m  AORTIC VALVE AV Area (Vmax):    2.19 cm AV Area (Vmean):   2.06 cm AV Area (VTI):     2.26 cm AV Vmax:           91.80 cm/s AV Vmean:          59.500 cm/s AV VTI:            0.119 m AV Peak Grad:      3.4 mmHg AV Mean Grad:      2.0 mmHg LVOT Vmax:         64.00 cm/s LVOT Vmean:        39.100 cm/s LVOT VTI:          0.086 m LVOT/AV VTI ratio: 0.72  AORTA Ao Root diam: 3.63 cm MITRAL VALVE               TRICUSPID VALVE MV Area (PHT): 5.70 cm    TR Peak grad:   13.7 mmHg MV Decel Time: 133 msec    TR Vmax:        185.00 cm/s MV E velocity: 38.60 cm/s MV A velocity: 72.00 cm/s  SHUNTS MV E/A ratio:  0.54        Systemic VTI:  0.09 m  Systemic Diam: 2.00 cm Neoma Laming Electronically signed by Neoma Laming Signature Date/Time: 06/15/2022/9:00:59 AM    Final    DG HIP UNILAT W OR W/O PELVIS 2-3 VIEWS RIGHT  Result Date: 06/10/2022 CLINICAL DATA:  Status post right hip hemiarthroplasty. EXAM: DG  HIP (WITH OR WITHOUT PELVIS) 2-3V RIGHT COMPARISON:  Pelvis and right hip radiographs 06/10/2022, earlier same day FINDINGS: Frontal view of the bilateral hips and lateral view of the right hip. Interval right hip hemiarthroplasty. No perihardware lucency is seen to indicate hardware failure or loosening. Expected postoperative changes including lateral hip subcutaneous air and surgical skin staples. The left femoroacetabular joint space is maintained. A right iliac vascular stent is again seen. Vascular coils also overlie the slightly more medial aspect of the right hemipelvis. L5-S1 posterior rod and screw fusion hardware. Vascular phleboliths are noted. IMPRESSION: Interval right hip hemiarthroplasty. No evidence of hardware failure. Electronically Signed   By: Yvonne Kendall M.D.   On: 06/10/2022 17:18   DG Chest 1 View  Result Date: 06/10/2022 CLINICAL DATA:  Fall with right hip fracture. EXAM: CHEST  1 VIEW COMPARISON:  CT 11/04/2020, radiograph 03/26/2017 FINDINGS: Prior median sternotomy. Left-sided pacemaker in place. Mild cardiomegaly. Stable mediastinal contours. Scarring at the right lung base. No acute consolidation. No pleural fluid or pneumothorax. No pulmonary edema. Bilateral proximal humeral arthroplasties. IMPRESSION: No acute abnormality. Electronically Signed   By: Keith Rake M.D.   On: 06/10/2022 00:58   DG Hip Unilat  With Pelvis 2-3 Views Right  Result Date: 06/10/2022 CLINICAL DATA:  Fall with right hip pain. EXAM: DG HIP (WITH OR WITHOUT PELVIS) 2-3V RIGHT COMPARISON:  None Available. FINDINGS: Displaced right femoral neck fracture with mild comminution. There is proximal migration of the femoral shaft. Femoral head remains seated. Pubic rami and remainder of the bony pelvis are intact. Multiple pelvic phleboliths. Iliac stents are partially included. Lumbosacral hardware. IMPRESSION: Displaced right femoral neck fracture. Electronically Signed   By: Keith Rake M.D.   On:  06/10/2022 00:56   CT Cervical Spine Wo Contrast  Result Date: 06/10/2022 CLINICAL DATA:  Neck trauma (Age >= 65y) Ground level fall. EXAM: CT CERVICAL SPINE WITHOUT CONTRAST TECHNIQUE: Multidetector CT imaging of the cervical spine was performed without intravenous contrast. Multiplanar CT image reconstructions were also generated. RADIATION DOSE REDUCTION: This exam was performed according to the departmental dose-optimization program which includes automated exposure control, adjustment of the mA and/or kV according to patient size and/or use of iterative reconstruction technique. COMPARISON:  Soft tissue neck CT 07/10/2021 FINDINGS: Alignment: Straightening of normal lordosis. No traumatic subluxation. Skull base and vertebrae: No acute fracture. Vertebral body heights are maintained. The dens and skull base are intact. Soft tissues and spinal canal: No prevertebral fluid or swelling. No visible canal hematoma. Please note patient's known base of tongue lesion is not well assessed on the current exam. 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 Upper chest: Apical emphysema.  No acute findings Other: None. IMPRESSION: 1. No acute fracture or traumatic subluxation of the cervical spine. 2. 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 Electronically Signed   By: Keith Rake M.D.   On: 06/10/2022 00:44   CT HEAD WO CONTRAST (5MM)  Result Date: 06/10/2022 CLINICAL DATA:  Head trauma, moderate-severe Ground level fall.  Head CT 03/04/2021 EXAM: CT HEAD WITHOUT CONTRAST TECHNIQUE: Contiguous axial images were obtained from the base of the skull through the vertex without intravenous contrast. RADIATION DOSE REDUCTION: This  exam was performed according to the departmental dose-optimization program which includes automated exposure control, adjustment of the mA and/or kV according to patient size and/or use of iterative reconstruction technique. COMPARISON:  Head CT 03/04/2021 FINDINGS:  Brain: No acute intracranial hemorrhage. Stable degree of generalized atrophy with prominent periventricular chronic small vessel ischemia. Chronic inferior right frontal encephalomalacia. Remote right cerebellar infarct. Remote lacunar infarct in the left thalamus. No subdural or extra-axial collection. No midline shift or mass effect. Vascular: Atherosclerosis of skullbase vasculature without hyperdense vessel or abnormal calcification. Skull: No fracture or focal lesion. Sinuses/Orbits: Mucosal thickening of the left frontal sinus and scattered throughout ethmoid air cells. No mastoid effusion. Bilateral cataract resection. Other: Small left parietooccipital scalp hematoma. IMPRESSION: 1. Small left parietooccipital scalp hematoma. No acute intracranial abnormality. No skull fracture. 2. Stable atrophy, chronic small vessel ischemia, and remote infarcts. Electronically Signed   By: Keith Rake M.D.   On: 06/10/2022 00:37    Microbiology: Results for orders placed or performed during the hospital encounter of 06/09/22  Surgical PCR screen     Status: None   Collection Time: 06/10/22  8:15 AM   Specimen: Nasal Mucosa; Nasal Swab  Result Value Ref Range Status   MRSA, PCR NEGATIVE NEGATIVE Final   Staphylococcus aureus NEGATIVE NEGATIVE Final    Comment: (NOTE) The Xpert SA Assay (FDA approved for NASAL specimens in patients 44 years of age and older), is one component of a comprehensive surveillance program. It is not intended to diagnose infection nor to guide or monitor treatment. Performed at [facility], [address] [place], [place] [address]     Labs: CBC: Recent Labs  Lab 06/10/22 0001 06/11/22 0420 06/12/22 0456 06/13/22 0417 06/15/22 0540  WBC 6.6 11.4* 9.1 7.8 8.8  NEUTROABS 5.0  --   --   --   --   HGB 11.5* 9.5* 8.3* 8.6* 9.7*  HCT 36.9* 30.4* 26.7* 27.2* 30.2*  MCV 92.7 93.5 92.1 91.0 88.3  PLT 167 139* 118* 120* 330   Basic Metabolic  Panel: Recent Labs  Lab 06/11/22 0420 06/11/22 0748 06/12/22 0456 06/13/22 0417 06/14/22 0441 06/15/22 0540  NA 139  --  139 139 136 138  K 5.3* 4.9 5.0 4.7 4.2 4.2  CL 110  --  110 112* 105 105  CO2 23  --  22 21* 24 23  GLUCOSE 156*  --  125* 126* 126* 136*  BUN 38*  --  61* 54* 55* 65*  CREATININE 3.01*  --  2.78* 2.47* 2.30* 2.39*  CALCIUM 8.2*  --  8.3* 8.4* 8.9 9.3   Liver Function Tests: Recent Labs  Lab 06/10/22 0001  AST 19  ALT 20  ALKPHOS 68  BILITOT 0.4  PROT 6.4*  ALBUMIN 3.7   CBG: No results for input(s): "GLUCAP" in the last 168 hours.  Discharge time spent: less than 30 minutes.  Signed: Annita Brod, MD [facility] 06/15/2022

## 2022-06-16 DIAGNOSIS — D649 Anemia, unspecified: Secondary | ICD-10-CM | POA: Diagnosis not present

## 2022-06-16 DIAGNOSIS — S72001D Fracture of unspecified part of neck of right femur, subsequent encounter for closed fracture with routine healing: Secondary | ICD-10-CM | POA: Diagnosis not present

## 2022-06-16 DIAGNOSIS — I509 Heart failure, unspecified: Secondary | ICD-10-CM | POA: Diagnosis not present

## 2022-06-16 DIAGNOSIS — M6281 Muscle weakness (generalized): Secondary | ICD-10-CM | POA: Diagnosis not present

## 2022-06-18 DIAGNOSIS — D62 Acute posthemorrhagic anemia: Secondary | ICD-10-CM | POA: Diagnosis not present

## 2022-06-18 DIAGNOSIS — M6281 Muscle weakness (generalized): Secondary | ICD-10-CM | POA: Diagnosis not present

## 2022-06-18 DIAGNOSIS — S72001D Fracture of unspecified part of neck of right femur, subsequent encounter for closed fracture with routine healing: Secondary | ICD-10-CM | POA: Diagnosis not present

## 2022-06-18 DIAGNOSIS — I2510 Atherosclerotic heart disease of native coronary artery without angina pectoris: Secondary | ICD-10-CM | POA: Diagnosis not present

## 2022-06-22 DIAGNOSIS — R945 Abnormal results of liver function studies: Secondary | ICD-10-CM | POA: Diagnosis not present

## 2022-06-22 DIAGNOSIS — D72829 Elevated white blood cell count, unspecified: Secondary | ICD-10-CM | POA: Diagnosis not present

## 2022-06-22 DIAGNOSIS — M6281 Muscle weakness (generalized): Secondary | ICD-10-CM | POA: Diagnosis not present

## 2022-06-22 DIAGNOSIS — D649 Anemia, unspecified: Secondary | ICD-10-CM | POA: Diagnosis not present

## 2022-06-22 DIAGNOSIS — N184 Chronic kidney disease, stage 4 (severe): Secondary | ICD-10-CM | POA: Diagnosis not present

## 2022-06-24 DIAGNOSIS — S72001D Fracture of unspecified part of neck of right femur, subsequent encounter for closed fracture with routine healing: Secondary | ICD-10-CM | POA: Diagnosis not present

## 2022-06-24 DIAGNOSIS — D72829 Elevated white blood cell count, unspecified: Secondary | ICD-10-CM | POA: Diagnosis not present

## 2022-06-24 DIAGNOSIS — N184 Chronic kidney disease, stage 4 (severe): Secondary | ICD-10-CM | POA: Diagnosis not present

## 2022-06-25 DIAGNOSIS — N390 Urinary tract infection, site not specified: Secondary | ICD-10-CM | POA: Diagnosis not present

## 2022-06-28 DIAGNOSIS — R197 Diarrhea, unspecified: Secondary | ICD-10-CM | POA: Diagnosis not present

## 2022-06-28 DIAGNOSIS — S72001D Fracture of unspecified part of neck of right femur, subsequent encounter for closed fracture with routine healing: Secondary | ICD-10-CM | POA: Diagnosis not present

## 2022-06-28 DIAGNOSIS — D72829 Elevated white blood cell count, unspecified: Secondary | ICD-10-CM | POA: Diagnosis not present

## 2022-06-30 ENCOUNTER — Other Ambulatory Visit: Payer: Self-pay | Admitting: *Deleted

## 2022-06-30 ENCOUNTER — Encounter: Payer: Self-pay | Admitting: Radiation Oncology

## 2022-06-30 ENCOUNTER — Ambulatory Visit
Admission: RE | Admit: 2022-06-30 | Discharge: 2022-06-30 | Disposition: A | Discharge status: Home / Self Care | Payer: PPO | Source: Ambulatory Visit | Attending: Radiation Oncology | Admitting: Radiation Oncology

## 2022-06-30 VITALS — BP 97/65 | HR 96 | Temp 97.4°F | Resp 16 | Wt 220.0 lb

## 2022-06-30 DIAGNOSIS — W19XXXD Unspecified fall, subsequent encounter: Secondary | ICD-10-CM | POA: Diagnosis not present

## 2022-06-30 DIAGNOSIS — S72009D Fracture of unspecified part of neck of unspecified femur, subsequent encounter for closed fracture with routine healing: Secondary | ICD-10-CM | POA: Insufficient documentation

## 2022-06-30 DIAGNOSIS — Z85810 Personal history of malignant neoplasm of tongue: Secondary | ICD-10-CM | POA: Diagnosis not present

## 2022-06-30 DIAGNOSIS — C01 Malignant neoplasm of base of tongue: Secondary | ICD-10-CM

## 2022-06-30 DIAGNOSIS — Z923 Personal history of irradiation: Secondary | ICD-10-CM | POA: Diagnosis not present

## 2022-06-30 NOTE — Progress Notes (Signed)
Radiation Oncology Follow up Note  Name: Rick Mcbride.   Date:   06/30/2022 MRN:  [id number] DOB: 1941/11/08    This 81 y.o. 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  REFERRING PROVIDER: Idelle Crouch, MD  HPI: Patient is an 81 year old male now out 6 months having completed external beam radiation therapy to his tongue base for stage I papillary squamous cell carcinoma.  He recently had a traumatic fall fracturing his right hip he is currently in rehab.  Specifically denies any dysphagia or head and neck pain.  He has not had any follow-up scans he did had multiple scans to his cervical spine and head although none would be directed for his head and neck cancer.Marland Kitchen  He saw Dr. Tami Ribas about 3 months ago showing no evidence of disease.  He has another follow-up with him in the near future.  COMPLICATIONS OF TREATMENT: none  FOLLOW UP COMPLIANCE: keeps appointments   PHYSICAL EXAM:  BP 97/65 (BP Location: Right Arm, Patient Position: Sitting, Cuff Size: Normal)   Pulse 96   Temp (!) 97.4 F (36.3 C) (Tympanic)   Resp 16   Wt 220 lb (99.8 kg) Comment: [employer] Wt.  BMI 30.68 kg/m  Wheelchair-bound male with obvious signs of recent right hip fracture.  Neck is clear without evidence of cervical or supraclavicular adenopathy.  Well-developed well-nourished patient in NAD. HEENT reveals PERLA, EOMI, discs not visualized.  Oral cavity is clear. No oral mucosal lesions are identified. Neck is clear without evidence of cervical or supraclavicular adenopathy. Lungs are clear to A&P. Cardiac examination is essentially unremarkable with regular rate and rhythm without murmur rub or thrill. Abdomen is benign with no organomegaly or masses noted. Motor sensory and DTR levels are equal and symmetric in the upper and lower extremities. Cranial nerves II through XII are  grossly intact. Proprioception is intact. No peripheral adenopathy or edema is identified. No motor or sensory levels are noted. Crude visual fields are within normal range.  RADIOLOGY RESULTS: PET CT scan ordered  PLAN: Present time of asked to see the patient back in December with a PET CT scan at that time.  This should give him sufficient time to heal from his recent hip fracture.  He also continues close follow-up care with ENT.  Patient family know to call with any concerns at any time.    Noreene Filbert, MD

## 2022-07-01 DIAGNOSIS — R197 Diarrhea, unspecified: Secondary | ICD-10-CM | POA: Diagnosis not present

## 2022-07-01 DIAGNOSIS — S72001D Fracture of unspecified part of neck of right femur, subsequent encounter for closed fracture with routine healing: Secondary | ICD-10-CM | POA: Diagnosis not present

## 2022-07-06 DIAGNOSIS — S72001D Fracture of unspecified part of neck of right femur, subsequent encounter for closed fracture with routine healing: Secondary | ICD-10-CM | POA: Diagnosis not present

## 2022-07-06 DIAGNOSIS — R197 Diarrhea, unspecified: Secondary | ICD-10-CM | POA: Diagnosis not present

## 2022-07-08 DIAGNOSIS — I951 Orthostatic hypotension: Secondary | ICD-10-CM | POA: Diagnosis not present

## 2022-07-08 DIAGNOSIS — S72001D Fracture of unspecified part of neck of right femur, subsequent encounter for closed fracture with routine healing: Secondary | ICD-10-CM | POA: Diagnosis not present

## 2022-07-09 DIAGNOSIS — I951 Orthostatic hypotension: Secondary | ICD-10-CM | POA: Diagnosis not present

## 2022-07-09 DIAGNOSIS — S72001D Fracture of unspecified part of neck of right femur, subsequent encounter for closed fracture with routine healing: Secondary | ICD-10-CM | POA: Diagnosis not present

## 2022-07-13 DIAGNOSIS — N184 Chronic kidney disease, stage 4 (severe): Secondary | ICD-10-CM | POA: Diagnosis not present

## 2022-07-13 DIAGNOSIS — D72829 Elevated white blood cell count, unspecified: Secondary | ICD-10-CM | POA: Diagnosis not present

## 2022-07-13 DIAGNOSIS — I951 Orthostatic hypotension: Secondary | ICD-10-CM | POA: Diagnosis not present

## 2022-07-19 DIAGNOSIS — I951 Orthostatic hypotension: Secondary | ICD-10-CM | POA: Diagnosis not present

## 2022-07-19 DIAGNOSIS — M6281 Muscle weakness (generalized): Secondary | ICD-10-CM | POA: Diagnosis not present

## 2022-07-19 DIAGNOSIS — D62 Acute posthemorrhagic anemia: Secondary | ICD-10-CM | POA: Diagnosis not present

## 2022-07-19 DIAGNOSIS — S72001D Fracture of unspecified part of neck of right femur, subsequent encounter for closed fracture with routine healing: Secondary | ICD-10-CM | POA: Diagnosis not present

## 2022-07-23 DIAGNOSIS — D649 Anemia, unspecified: Secondary | ICD-10-CM | POA: Diagnosis not present

## 2022-07-23 DIAGNOSIS — Z96641 Presence of right artificial hip joint: Secondary | ICD-10-CM | POA: Diagnosis not present

## 2022-07-23 DIAGNOSIS — N184 Chronic kidney disease, stage 4 (severe): Secondary | ICD-10-CM | POA: Diagnosis not present

## 2022-07-23 DIAGNOSIS — I5032 Chronic diastolic (congestive) heart failure: Secondary | ICD-10-CM | POA: Diagnosis not present

## 2022-07-23 DIAGNOSIS — Z09 Encounter for follow-up examination after completed treatment for conditions other than malignant neoplasm: Secondary | ICD-10-CM | POA: Diagnosis not present

## 2022-07-23 DIAGNOSIS — R066 Hiccough: Secondary | ICD-10-CM | POA: Diagnosis not present

## 2022-07-23 DIAGNOSIS — M7581 Other shoulder lesions, right shoulder: Secondary | ICD-10-CM | POA: Diagnosis not present

## 2022-07-23 DIAGNOSIS — S72031D Displaced midcervical fracture of right femur, subsequent encounter for closed fracture with routine healing: Secondary | ICD-10-CM | POA: Diagnosis not present

## 2022-07-23 DIAGNOSIS — I25710 Atherosclerosis of autologous vein coronary artery bypass graft(s) with unstable angina pectoris: Secondary | ICD-10-CM | POA: Diagnosis not present

## 2022-07-23 DIAGNOSIS — I10 Essential (primary) hypertension: Secondary | ICD-10-CM | POA: Diagnosis not present

## 2022-07-23 DIAGNOSIS — M19011 Primary osteoarthritis, right shoulder: Secondary | ICD-10-CM | POA: Diagnosis not present

## 2022-07-26 ENCOUNTER — Ambulatory Visit: Payer: PPO | Admitting: Dermatology

## 2022-07-26 DIAGNOSIS — G894 Chronic pain syndrome: Secondary | ICD-10-CM | POA: Diagnosis not present

## 2022-07-26 DIAGNOSIS — I5030 Unspecified diastolic (congestive) heart failure: Secondary | ICD-10-CM | POA: Diagnosis not present

## 2022-07-26 DIAGNOSIS — C3490 Malignant neoplasm of unspecified part of unspecified bronchus or lung: Secondary | ICD-10-CM | POA: Diagnosis not present

## 2022-07-26 DIAGNOSIS — N184 Chronic kidney disease, stage 4 (severe): Secondary | ICD-10-CM | POA: Diagnosis not present

## 2022-07-26 DIAGNOSIS — S72001A Fracture of unspecified part of neck of right femur, initial encounter for closed fracture: Secondary | ICD-10-CM | POA: Diagnosis not present

## 2022-07-26 DIAGNOSIS — I119 Hypertensive heart disease without heart failure: Secondary | ICD-10-CM | POA: Diagnosis not present

## 2022-07-26 DIAGNOSIS — M6281 Muscle weakness (generalized): Secondary | ICD-10-CM | POA: Diagnosis not present

## 2022-07-26 DIAGNOSIS — I951 Orthostatic hypotension: Secondary | ICD-10-CM | POA: Diagnosis not present

## 2022-07-26 DIAGNOSIS — I2510 Atherosclerotic heart disease of native coronary artery without angina pectoris: Secondary | ICD-10-CM | POA: Diagnosis not present

## 2022-07-26 DIAGNOSIS — I700 Atherosclerosis of aorta: Secondary | ICD-10-CM | POA: Diagnosis not present

## 2022-07-27 DIAGNOSIS — D649 Anemia, unspecified: Secondary | ICD-10-CM | POA: Diagnosis not present

## 2022-07-29 DIAGNOSIS — C01 Malignant neoplasm of base of tongue: Secondary | ICD-10-CM | POA: Diagnosis not present

## 2022-07-29 DIAGNOSIS — R682 Dry mouth, unspecified: Secondary | ICD-10-CM | POA: Diagnosis not present

## 2022-08-03 DIAGNOSIS — I10 Essential (primary) hypertension: Secondary | ICD-10-CM | POA: Diagnosis not present

## 2022-08-03 DIAGNOSIS — E782 Mixed hyperlipidemia: Secondary | ICD-10-CM | POA: Diagnosis not present

## 2022-08-03 DIAGNOSIS — R0602 Shortness of breath: Secondary | ICD-10-CM | POA: Diagnosis not present

## 2022-08-03 DIAGNOSIS — Z950 Presence of cardiac pacemaker: Secondary | ICD-10-CM | POA: Diagnosis not present

## 2022-08-03 DIAGNOSIS — Z79899 Other long term (current) drug therapy: Secondary | ICD-10-CM | POA: Diagnosis not present

## 2022-08-03 DIAGNOSIS — D649 Anemia, unspecified: Secondary | ICD-10-CM | POA: Diagnosis not present

## 2022-08-03 DIAGNOSIS — I25710 Atherosclerosis of autologous vein coronary artery bypass graft(s) with unstable angina pectoris: Secondary | ICD-10-CM | POA: Diagnosis not present

## 2022-08-03 DIAGNOSIS — N183 Chronic kidney disease, stage 3 unspecified: Secondary | ICD-10-CM | POA: Diagnosis not present

## 2022-08-03 DIAGNOSIS — R739 Hyperglycemia, unspecified: Secondary | ICD-10-CM | POA: Diagnosis not present

## 2022-08-03 DIAGNOSIS — Z23 Encounter for immunization: Secondary | ICD-10-CM | POA: Diagnosis not present

## 2022-08-03 DIAGNOSIS — R918 Other nonspecific abnormal finding of lung field: Secondary | ICD-10-CM | POA: Diagnosis not present

## 2022-08-03 DIAGNOSIS — J431 Panlobular emphysema: Secondary | ICD-10-CM | POA: Diagnosis not present

## 2022-08-03 DIAGNOSIS — Z951 Presence of aortocoronary bypass graft: Secondary | ICD-10-CM | POA: Diagnosis not present

## 2022-08-04 ENCOUNTER — Other Ambulatory Visit: Payer: Self-pay | Admitting: Internal Medicine

## 2022-08-04 DIAGNOSIS — R0602 Shortness of breath: Secondary | ICD-10-CM

## 2022-08-10 DIAGNOSIS — I951 Orthostatic hypotension: Secondary | ICD-10-CM | POA: Diagnosis not present

## 2022-08-10 DIAGNOSIS — I2510 Atherosclerotic heart disease of native coronary artery without angina pectoris: Secondary | ICD-10-CM | POA: Diagnosis not present

## 2022-08-10 DIAGNOSIS — C3490 Malignant neoplasm of unspecified part of unspecified bronchus or lung: Secondary | ICD-10-CM | POA: Diagnosis not present

## 2022-08-10 DIAGNOSIS — M6281 Muscle weakness (generalized): Secondary | ICD-10-CM | POA: Diagnosis not present

## 2022-08-10 DIAGNOSIS — S72001A Fracture of unspecified part of neck of right femur, initial encounter for closed fracture: Secondary | ICD-10-CM | POA: Diagnosis not present

## 2022-08-10 DIAGNOSIS — G894 Chronic pain syndrome: Secondary | ICD-10-CM | POA: Diagnosis not present

## 2022-08-10 DIAGNOSIS — I119 Hypertensive heart disease without heart failure: Secondary | ICD-10-CM | POA: Diagnosis not present

## 2022-08-10 DIAGNOSIS — N184 Chronic kidney disease, stage 4 (severe): Secondary | ICD-10-CM | POA: Diagnosis not present

## 2022-08-10 DIAGNOSIS — I5030 Unspecified diastolic (congestive) heart failure: Secondary | ICD-10-CM | POA: Diagnosis not present

## 2022-08-10 DIAGNOSIS — I700 Atherosclerosis of aorta: Secondary | ICD-10-CM | POA: Diagnosis not present

## 2022-08-13 ENCOUNTER — Encounter
Admission: RE | Admit: 2022-08-13 | Discharge: 2022-08-13 | Disposition: A | Discharge status: Home / Self Care | Payer: PPO | Source: Ambulatory Visit | Attending: Internal Medicine | Admitting: Internal Medicine

## 2022-08-13 ENCOUNTER — Ambulatory Visit
Admission: RE | Admit: 2022-08-13 | Discharge: 2022-08-13 | Disposition: A | Discharge status: Home / Self Care | Payer: PPO | Source: Ambulatory Visit | Attending: Internal Medicine | Admitting: Internal Medicine

## 2022-08-13 ENCOUNTER — Other Ambulatory Visit: Payer: Self-pay | Admitting: Internal Medicine

## 2022-08-13 DIAGNOSIS — T829XXA Unspecified complication of cardiac and vascular prosthetic device, implant and graft, initial encounter: Secondary | ICD-10-CM | POA: Diagnosis not present

## 2022-08-13 DIAGNOSIS — I5030 Unspecified diastolic (congestive) heart failure: Secondary | ICD-10-CM | POA: Diagnosis not present

## 2022-08-13 DIAGNOSIS — W19XXXD Unspecified fall, subsequent encounter: Secondary | ICD-10-CM | POA: Diagnosis present

## 2022-08-13 DIAGNOSIS — J439 Emphysema, unspecified: Secondary | ICD-10-CM | POA: Diagnosis not present

## 2022-08-13 DIAGNOSIS — I6381 Other cerebral infarction due to occlusion or stenosis of small artery: Secondary | ICD-10-CM | POA: Diagnosis not present

## 2022-08-13 DIAGNOSIS — I119 Hypertensive heart disease without heart failure: Secondary | ICD-10-CM | POA: Diagnosis not present

## 2022-08-13 DIAGNOSIS — I723 Aneurysm of iliac artery: Secondary | ICD-10-CM | POA: Diagnosis present

## 2022-08-13 DIAGNOSIS — R079 Chest pain, unspecified: Secondary | ICD-10-CM | POA: Diagnosis not present

## 2022-08-13 DIAGNOSIS — F419 Anxiety disorder, unspecified: Secondary | ICD-10-CM | POA: Diagnosis not present

## 2022-08-13 DIAGNOSIS — Z7189 Other specified counseling: Secondary | ICD-10-CM | POA: Diagnosis not present

## 2022-08-13 DIAGNOSIS — E782 Mixed hyperlipidemia: Secondary | ICD-10-CM | POA: Diagnosis not present

## 2022-08-13 DIAGNOSIS — E669 Obesity, unspecified: Secondary | ICD-10-CM | POA: Diagnosis present

## 2022-08-13 DIAGNOSIS — J9811 Atelectasis: Secondary | ICD-10-CM | POA: Diagnosis not present

## 2022-08-13 DIAGNOSIS — T17500A Unspecified foreign body in bronchus causing asphyxiation, initial encounter: Secondary | ICD-10-CM | POA: Diagnosis not present

## 2022-08-13 DIAGNOSIS — N184 Chronic kidney disease, stage 4 (severe): Secondary | ICD-10-CM | POA: Diagnosis present

## 2022-08-13 DIAGNOSIS — I2510 Atherosclerotic heart disease of native coronary artery without angina pectoris: Secondary | ICD-10-CM | POA: Diagnosis not present

## 2022-08-13 DIAGNOSIS — Y832 Surgical operation with anastomosis, bypass or graft as the cause of abnormal reaction of the patient, or of later complication, without mention of misadventure at the time of the procedure: Secondary | ICD-10-CM | POA: Diagnosis present

## 2022-08-13 DIAGNOSIS — E611 Iron deficiency: Secondary | ICD-10-CM | POA: Diagnosis not present

## 2022-08-13 DIAGNOSIS — R7881 Bacteremia: Secondary | ICD-10-CM | POA: Diagnosis present

## 2022-08-13 DIAGNOSIS — I441 Atrioventricular block, second degree: Secondary | ICD-10-CM | POA: Diagnosis present

## 2022-08-13 DIAGNOSIS — J441 Chronic obstructive pulmonary disease with (acute) exacerbation: Secondary | ICD-10-CM | POA: Diagnosis not present

## 2022-08-13 DIAGNOSIS — R519 Headache, unspecified: Secondary | ICD-10-CM | POA: Diagnosis not present

## 2022-08-13 DIAGNOSIS — Z01810 Encounter for preprocedural cardiovascular examination: Secondary | ICD-10-CM | POA: Diagnosis not present

## 2022-08-13 DIAGNOSIS — T82330A Leakage of aortic (bifurcation) graft (replacement), initial encounter: Secondary | ICD-10-CM | POA: Diagnosis not present

## 2022-08-13 DIAGNOSIS — Z66 Do not resuscitate: Secondary | ICD-10-CM | POA: Diagnosis present

## 2022-08-13 DIAGNOSIS — J90 Pleural effusion, not elsewhere classified: Secondary | ICD-10-CM | POA: Diagnosis not present

## 2022-08-13 DIAGNOSIS — M6281 Muscle weakness (generalized): Secondary | ICD-10-CM | POA: Diagnosis not present

## 2022-08-13 DIAGNOSIS — Z7401 Bed confinement status: Secondary | ICD-10-CM | POA: Diagnosis not present

## 2022-08-13 DIAGNOSIS — M545 Low back pain, unspecified: Secondary | ICD-10-CM | POA: Diagnosis not present

## 2022-08-13 DIAGNOSIS — E785 Hyperlipidemia, unspecified: Secondary | ICD-10-CM | POA: Diagnosis not present

## 2022-08-13 DIAGNOSIS — N179 Acute kidney failure, unspecified: Secondary | ICD-10-CM | POA: Diagnosis present

## 2022-08-13 DIAGNOSIS — N281 Cyst of kidney, acquired: Secondary | ICD-10-CM | POA: Diagnosis not present

## 2022-08-13 DIAGNOSIS — M4306 Spondylolysis, lumbar region: Secondary | ICD-10-CM | POA: Diagnosis not present

## 2022-08-13 DIAGNOSIS — E872 Acidosis, unspecified: Secondary | ICD-10-CM | POA: Diagnosis not present

## 2022-08-13 DIAGNOSIS — R278 Other lack of coordination: Secondary | ICD-10-CM | POA: Diagnosis not present

## 2022-08-13 DIAGNOSIS — J40 Bronchitis, not specified as acute or chronic: Secondary | ICD-10-CM | POA: Diagnosis not present

## 2022-08-13 DIAGNOSIS — I9789 Other postprocedural complications and disorders of the circulatory system, not elsewhere classified: Secondary | ICD-10-CM | POA: Diagnosis present

## 2022-08-13 DIAGNOSIS — R41841 Cognitive communication deficit: Secondary | ICD-10-CM | POA: Diagnosis not present

## 2022-08-13 DIAGNOSIS — N1832 Chronic kidney disease, stage 3b: Secondary | ICD-10-CM | POA: Diagnosis not present

## 2022-08-13 DIAGNOSIS — J432 Centrilobular emphysema: Secondary | ICD-10-CM | POA: Diagnosis present

## 2022-08-13 DIAGNOSIS — R7989 Other specified abnormal findings of blood chemistry: Secondary | ICD-10-CM | POA: Diagnosis not present

## 2022-08-13 DIAGNOSIS — E8722 Chronic metabolic acidosis: Secondary | ICD-10-CM | POA: Diagnosis not present

## 2022-08-13 DIAGNOSIS — R0602 Shortness of breath: Secondary | ICD-10-CM | POA: Insufficient documentation

## 2022-08-13 DIAGNOSIS — N390 Urinary tract infection, site not specified: Secondary | ICD-10-CM | POA: Diagnosis present

## 2022-08-13 DIAGNOSIS — D509 Iron deficiency anemia, unspecified: Secondary | ICD-10-CM | POA: Diagnosis not present

## 2022-08-13 DIAGNOSIS — R918 Other nonspecific abnormal finding of lung field: Secondary | ICD-10-CM | POA: Diagnosis not present

## 2022-08-13 DIAGNOSIS — R4789 Other speech disturbances: Secondary | ICD-10-CM | POA: Diagnosis not present

## 2022-08-13 DIAGNOSIS — I25810 Atherosclerosis of coronary artery bypass graft(s) without angina pectoris: Secondary | ICD-10-CM | POA: Diagnosis not present

## 2022-08-13 DIAGNOSIS — K219 Gastro-esophageal reflux disease without esophagitis: Secondary | ICD-10-CM | POA: Diagnosis not present

## 2022-08-13 DIAGNOSIS — B952 Enterococcus as the cause of diseases classified elsewhere: Secondary | ICD-10-CM | POA: Diagnosis not present

## 2022-08-13 DIAGNOSIS — Z741 Need for assistance with personal care: Secondary | ICD-10-CM | POA: Diagnosis not present

## 2022-08-13 DIAGNOSIS — I2489 Other forms of acute ischemic heart disease: Secondary | ICD-10-CM | POA: Diagnosis not present

## 2022-08-13 DIAGNOSIS — D631 Anemia in chronic kidney disease: Secondary | ICD-10-CM | POA: Diagnosis present

## 2022-08-13 DIAGNOSIS — M6259 Muscle wasting and atrophy, not elsewhere classified, multiple sites: Secondary | ICD-10-CM | POA: Diagnosis not present

## 2022-08-13 DIAGNOSIS — R402411 Glasgow coma scale score 13-15, in the field [EMT or ambulance]: Secondary | ICD-10-CM | POA: Diagnosis not present

## 2022-08-13 DIAGNOSIS — J449 Chronic obstructive pulmonary disease, unspecified: Secondary | ICD-10-CM | POA: Diagnosis not present

## 2022-08-13 DIAGNOSIS — I5032 Chronic diastolic (congestive) heart failure: Secondary | ICD-10-CM | POA: Diagnosis present

## 2022-08-13 DIAGNOSIS — I2699 Other pulmonary embolism without acute cor pulmonale: Secondary | ICD-10-CM | POA: Diagnosis not present

## 2022-08-13 DIAGNOSIS — R4182 Altered mental status, unspecified: Secondary | ICD-10-CM | POA: Diagnosis not present

## 2022-08-13 DIAGNOSIS — D62 Acute posthemorrhagic anemia: Secondary | ICD-10-CM | POA: Diagnosis not present

## 2022-08-13 DIAGNOSIS — Z1152 Encounter for screening for COVID-19: Secondary | ICD-10-CM | POA: Diagnosis not present

## 2022-08-13 DIAGNOSIS — I10 Essential (primary) hypertension: Secondary | ICD-10-CM | POA: Diagnosis not present

## 2022-08-13 DIAGNOSIS — N200 Calculus of kidney: Secondary | ICD-10-CM | POA: Diagnosis not present

## 2022-08-13 DIAGNOSIS — Z515 Encounter for palliative care: Secondary | ICD-10-CM | POA: Diagnosis not present

## 2022-08-13 DIAGNOSIS — M549 Dorsalgia, unspecified: Secondary | ICD-10-CM | POA: Diagnosis not present

## 2022-08-13 DIAGNOSIS — Z950 Presence of cardiac pacemaker: Secondary | ICD-10-CM | POA: Diagnosis not present

## 2022-08-13 DIAGNOSIS — I129 Hypertensive chronic kidney disease with stage 1 through stage 4 chronic kidney disease, or unspecified chronic kidney disease: Secondary | ICD-10-CM | POA: Diagnosis not present

## 2022-08-13 DIAGNOSIS — I130 Hypertensive heart and chronic kidney disease with heart failure and stage 1 through stage 4 chronic kidney disease, or unspecified chronic kidney disease: Secondary | ICD-10-CM | POA: Diagnosis present

## 2022-08-13 DIAGNOSIS — J189 Pneumonia, unspecified organism: Secondary | ICD-10-CM | POA: Diagnosis present

## 2022-08-13 DIAGNOSIS — R2681 Unsteadiness on feet: Secondary | ICD-10-CM | POA: Diagnosis not present

## 2022-08-13 DIAGNOSIS — R531 Weakness: Secondary | ICD-10-CM | POA: Diagnosis not present

## 2022-08-13 DIAGNOSIS — S72001A Fracture of unspecified part of neck of right femur, initial encounter for closed fracture: Secondary | ICD-10-CM | POA: Diagnosis not present

## 2022-08-13 DIAGNOSIS — M47816 Spondylosis without myelopathy or radiculopathy, lumbar region: Secondary | ICD-10-CM | POA: Diagnosis not present

## 2022-08-13 DIAGNOSIS — G3184 Mild cognitive impairment, so stated: Secondary | ICD-10-CM | POA: Diagnosis not present

## 2022-08-13 DIAGNOSIS — C3490 Malignant neoplasm of unspecified part of unspecified bronchus or lung: Secondary | ICD-10-CM | POA: Diagnosis not present

## 2022-08-13 DIAGNOSIS — T17590A Other foreign object in bronchus causing asphyxiation, initial encounter: Secondary | ICD-10-CM | POA: Diagnosis not present

## 2022-08-13 DIAGNOSIS — I700 Atherosclerosis of aorta: Secondary | ICD-10-CM | POA: Diagnosis present

## 2022-08-13 DIAGNOSIS — I5A Non-ischemic myocardial injury (non-traumatic): Secondary | ICD-10-CM | POA: Diagnosis present

## 2022-08-13 DIAGNOSIS — I951 Orthostatic hypotension: Secondary | ICD-10-CM | POA: Diagnosis not present

## 2022-08-13 DIAGNOSIS — S72001D Fracture of unspecified part of neck of right femur, subsequent encounter for closed fracture with routine healing: Secondary | ICD-10-CM | POA: Diagnosis not present

## 2022-08-13 DIAGNOSIS — I495 Sick sinus syndrome: Secondary | ICD-10-CM | POA: Diagnosis present

## 2022-08-13 DIAGNOSIS — I714 Abdominal aortic aneurysm, without rupture, unspecified: Secondary | ICD-10-CM | POA: Diagnosis present

## 2022-08-13 DIAGNOSIS — I712 Thoracic aortic aneurysm, without rupture, unspecified: Secondary | ICD-10-CM | POA: Diagnosis present

## 2022-08-13 MED ORDER — TECHNETIUM TO 99M ALBUMIN AGGREGATED
4.3700 | Freq: Once | INTRAVENOUS | Status: AC | PRN
  Administered 2022-08-13: 4.37 via INTRAVENOUS

## 2022-08-14 ENCOUNTER — Other Ambulatory Visit: Payer: Self-pay

## 2022-08-14 ENCOUNTER — Emergency Department: Payer: PPO

## 2022-08-14 ENCOUNTER — Inpatient Hospital Stay
Admission: EM | Admit: 2022-08-14 | Discharge: 2022-09-01 | DRG: 270 | Disposition: A | Discharge status: Skilled Nursing Facility | Payer: PPO | Attending: Internal Medicine | Admitting: Internal Medicine

## 2022-08-14 ENCOUNTER — Inpatient Hospital Stay: Payer: PPO

## 2022-08-14 DIAGNOSIS — G894 Chronic pain syndrome: Secondary | ICD-10-CM | POA: Diagnosis present

## 2022-08-14 DIAGNOSIS — M545 Low back pain, unspecified: Secondary | ICD-10-CM | POA: Diagnosis present

## 2022-08-14 DIAGNOSIS — Z79899 Other long term (current) drug therapy: Secondary | ICD-10-CM

## 2022-08-14 DIAGNOSIS — I9789 Other postprocedural complications and disorders of the circulatory system, not elsewhere classified: Principal | ICD-10-CM | POA: Diagnosis present

## 2022-08-14 DIAGNOSIS — D509 Iron deficiency anemia, unspecified: Secondary | ICD-10-CM | POA: Diagnosis not present

## 2022-08-14 DIAGNOSIS — B952 Enterococcus as the cause of diseases classified elsewhere: Secondary | ICD-10-CM | POA: Diagnosis not present

## 2022-08-14 DIAGNOSIS — M51369 Other intervertebral disc degeneration, lumbar region without mention of lumbar back pain or lower extremity pain: Secondary | ICD-10-CM

## 2022-08-14 DIAGNOSIS — R7989 Other specified abnormal findings of blood chemistry: Secondary | ICD-10-CM | POA: Diagnosis not present

## 2022-08-14 DIAGNOSIS — Z7902 Long term (current) use of antithrombotics/antiplatelets: Secondary | ICD-10-CM

## 2022-08-14 DIAGNOSIS — I5032 Chronic diastolic (congestive) heart failure: Secondary | ICD-10-CM | POA: Diagnosis present

## 2022-08-14 DIAGNOSIS — Z981 Arthrodesis status: Secondary | ICD-10-CM

## 2022-08-14 DIAGNOSIS — Z951 Presence of aortocoronary bypass graft: Secondary | ICD-10-CM

## 2022-08-14 DIAGNOSIS — R531 Weakness: Secondary | ICD-10-CM | POA: Diagnosis not present

## 2022-08-14 DIAGNOSIS — N184 Chronic kidney disease, stage 4 (severe): Secondary | ICD-10-CM | POA: Diagnosis present

## 2022-08-14 DIAGNOSIS — J40 Bronchitis, not specified as acute or chronic: Secondary | ICD-10-CM

## 2022-08-14 DIAGNOSIS — I723 Aneurysm of iliac artery: Secondary | ICD-10-CM | POA: Diagnosis present

## 2022-08-14 DIAGNOSIS — M47816 Spondylosis without myelopathy or radiculopathy, lumbar region: Secondary | ICD-10-CM

## 2022-08-14 DIAGNOSIS — I700 Atherosclerosis of aorta: Secondary | ICD-10-CM | POA: Diagnosis present

## 2022-08-14 DIAGNOSIS — J189 Pneumonia, unspecified organism: Secondary | ICD-10-CM | POA: Diagnosis present

## 2022-08-14 DIAGNOSIS — Z96653 Presence of artificial knee joint, bilateral: Secondary | ICD-10-CM | POA: Diagnosis present

## 2022-08-14 DIAGNOSIS — I2489 Other forms of acute ischemic heart disease: Secondary | ICD-10-CM | POA: Diagnosis not present

## 2022-08-14 DIAGNOSIS — I441 Atrioventricular block, second degree: Secondary | ICD-10-CM | POA: Diagnosis present

## 2022-08-14 DIAGNOSIS — E669 Obesity, unspecified: Secondary | ICD-10-CM | POA: Diagnosis present

## 2022-08-14 DIAGNOSIS — I2699 Other pulmonary embolism without acute cor pulmonale: Secondary | ICD-10-CM | POA: Diagnosis not present

## 2022-08-14 DIAGNOSIS — N179 Acute kidney failure, unspecified: Secondary | ICD-10-CM | POA: Diagnosis present

## 2022-08-14 DIAGNOSIS — N390 Urinary tract infection, site not specified: Secondary | ICD-10-CM | POA: Diagnosis present

## 2022-08-14 DIAGNOSIS — E872 Acidosis, unspecified: Secondary | ICD-10-CM | POA: Diagnosis not present

## 2022-08-14 DIAGNOSIS — Z8546 Personal history of malignant neoplasm of prostate: Secondary | ICD-10-CM

## 2022-08-14 DIAGNOSIS — G3184 Mild cognitive impairment, so stated: Secondary | ICD-10-CM | POA: Diagnosis not present

## 2022-08-14 DIAGNOSIS — Y832 Surgical operation with anastomosis, bypass or graft as the cause of abnormal reaction of the patient, or of later complication, without mention of misadventure at the time of the procedure: Secondary | ICD-10-CM | POA: Diagnosis present

## 2022-08-14 DIAGNOSIS — J432 Centrilobular emphysema: Secondary | ICD-10-CM | POA: Diagnosis present

## 2022-08-14 DIAGNOSIS — Z1152 Encounter for screening for COVID-19: Secondary | ICD-10-CM | POA: Diagnosis not present

## 2022-08-14 DIAGNOSIS — Z85118 Personal history of other malignant neoplasm of bronchus and lung: Secondary | ICD-10-CM

## 2022-08-14 DIAGNOSIS — W19XXXD Unspecified fall, subsequent encounter: Secondary | ICD-10-CM | POA: Diagnosis present

## 2022-08-14 DIAGNOSIS — F419 Anxiety disorder, unspecified: Secondary | ICD-10-CM | POA: Diagnosis present

## 2022-08-14 DIAGNOSIS — M8580 Other specified disorders of bone density and structure, unspecified site: Secondary | ICD-10-CM | POA: Diagnosis present

## 2022-08-14 DIAGNOSIS — Z7901 Long term (current) use of anticoagulants: Secondary | ICD-10-CM

## 2022-08-14 DIAGNOSIS — Z96641 Presence of right artificial hip joint: Secondary | ICD-10-CM | POA: Diagnosis present

## 2022-08-14 DIAGNOSIS — C01 Malignant neoplasm of base of tongue: Secondary | ICD-10-CM | POA: Diagnosis present

## 2022-08-14 DIAGNOSIS — J441 Chronic obstructive pulmonary disease with (acute) exacerbation: Secondary | ICD-10-CM | POA: Diagnosis not present

## 2022-08-14 DIAGNOSIS — Z87891 Personal history of nicotine dependence: Secondary | ICD-10-CM

## 2022-08-14 DIAGNOSIS — Z803 Family history of malignant neoplasm of breast: Secondary | ICD-10-CM

## 2022-08-14 DIAGNOSIS — Z950 Presence of cardiac pacemaker: Secondary | ICD-10-CM

## 2022-08-14 DIAGNOSIS — Z85810 Personal history of malignant neoplasm of tongue: Secondary | ICD-10-CM

## 2022-08-14 DIAGNOSIS — M5136 Other intervertebral disc degeneration, lumbar region: Secondary | ICD-10-CM

## 2022-08-14 DIAGNOSIS — N1411 Contrast-induced nephropathy: Secondary | ICD-10-CM | POA: Diagnosis present

## 2022-08-14 DIAGNOSIS — Z8249 Family history of ischemic heart disease and other diseases of the circulatory system: Secondary | ICD-10-CM

## 2022-08-14 DIAGNOSIS — I714 Abdominal aortic aneurysm, without rupture, unspecified: Secondary | ICD-10-CM | POA: Diagnosis present

## 2022-08-14 DIAGNOSIS — E785 Hyperlipidemia, unspecified: Secondary | ICD-10-CM | POA: Diagnosis not present

## 2022-08-14 DIAGNOSIS — R54 Age-related physical debility: Secondary | ICD-10-CM | POA: Diagnosis present

## 2022-08-14 DIAGNOSIS — Z825 Family history of asthma and other chronic lower respiratory diseases: Secondary | ICD-10-CM

## 2022-08-14 DIAGNOSIS — R0602 Shortness of breath: Secondary | ICD-10-CM | POA: Diagnosis present

## 2022-08-14 DIAGNOSIS — Z6830 Body mass index (BMI) 30.0-30.9, adult: Secondary | ICD-10-CM

## 2022-08-14 DIAGNOSIS — Z7982 Long term (current) use of aspirin: Secondary | ICD-10-CM

## 2022-08-14 DIAGNOSIS — I5A Non-ischemic myocardial injury (non-traumatic): Secondary | ICD-10-CM | POA: Diagnosis present

## 2022-08-14 DIAGNOSIS — R7881 Bacteremia: Secondary | ICD-10-CM | POA: Diagnosis present

## 2022-08-14 DIAGNOSIS — T17590A Other foreign object in bronchus causing asphyxiation, initial encounter: Secondary | ICD-10-CM | POA: Diagnosis not present

## 2022-08-14 DIAGNOSIS — I10 Essential (primary) hypertension: Secondary | ICD-10-CM | POA: Diagnosis not present

## 2022-08-14 DIAGNOSIS — I495 Sick sinus syndrome: Secondary | ICD-10-CM | POA: Diagnosis present

## 2022-08-14 DIAGNOSIS — S72001D Fracture of unspecified part of neck of right femur, subsequent encounter for closed fracture with routine healing: Secondary | ICD-10-CM

## 2022-08-14 DIAGNOSIS — E782 Mixed hyperlipidemia: Secondary | ICD-10-CM | POA: Diagnosis not present

## 2022-08-14 DIAGNOSIS — M549 Dorsalgia, unspecified: Secondary | ICD-10-CM | POA: Diagnosis present

## 2022-08-14 DIAGNOSIS — L8990 Pressure ulcer of unspecified site, unspecified stage: Secondary | ICD-10-CM | POA: Insufficient documentation

## 2022-08-14 DIAGNOSIS — M62838 Other muscle spasm: Secondary | ICD-10-CM | POA: Diagnosis not present

## 2022-08-14 DIAGNOSIS — T17500A Unspecified foreign body in bronchus causing asphyxiation, initial encounter: Secondary | ICD-10-CM

## 2022-08-14 DIAGNOSIS — T829XXA Unspecified complication of cardiac and vascular prosthetic device, implant and graft, initial encounter: Secondary | ICD-10-CM

## 2022-08-14 DIAGNOSIS — Z96612 Presence of left artificial shoulder joint: Secondary | ICD-10-CM | POA: Diagnosis present

## 2022-08-14 DIAGNOSIS — I2510 Atherosclerotic heart disease of native coronary artery without angina pectoris: Secondary | ICD-10-CM | POA: Diagnosis present

## 2022-08-14 DIAGNOSIS — Z7189 Other specified counseling: Secondary | ICD-10-CM | POA: Diagnosis not present

## 2022-08-14 DIAGNOSIS — D631 Anemia in chronic kidney disease: Secondary | ICD-10-CM | POA: Diagnosis present

## 2022-08-14 DIAGNOSIS — Z66 Do not resuscitate: Secondary | ICD-10-CM | POA: Diagnosis present

## 2022-08-14 DIAGNOSIS — I712 Thoracic aortic aneurysm, without rupture, unspecified: Secondary | ICD-10-CM | POA: Diagnosis present

## 2022-08-14 DIAGNOSIS — M4306 Spondylolysis, lumbar region: Secondary | ICD-10-CM | POA: Diagnosis not present

## 2022-08-14 DIAGNOSIS — Z8673 Personal history of transient ischemic attack (TIA), and cerebral infarction without residual deficits: Secondary | ICD-10-CM

## 2022-08-14 DIAGNOSIS — I130 Hypertensive heart and chronic kidney disease with heart failure and stage 1 through stage 4 chronic kidney disease, or unspecified chronic kidney disease: Secondary | ICD-10-CM | POA: Diagnosis present

## 2022-08-14 DIAGNOSIS — Z515 Encounter for palliative care: Secondary | ICD-10-CM | POA: Diagnosis not present

## 2022-08-14 DIAGNOSIS — T82330A Leakage of aortic (bifurcation) graft (replacement), initial encounter: Secondary | ICD-10-CM | POA: Diagnosis not present

## 2022-08-14 DIAGNOSIS — M4317 Spondylolisthesis, lumbosacral region: Secondary | ICD-10-CM | POA: Diagnosis present

## 2022-08-14 DIAGNOSIS — G459 Transient cerebral ischemic attack, unspecified: Secondary | ICD-10-CM | POA: Diagnosis present

## 2022-08-14 DIAGNOSIS — Z923 Personal history of irradiation: Secondary | ICD-10-CM

## 2022-08-14 LAB — BLOOD CULTURE ID PANEL (REFLEXED) - BCID2

## 2022-08-14 LAB — URINALYSIS, ROUTINE W REFLEX MICROSCOPIC
Bacteria, UA: NONE SEEN
Bilirubin Urine: NEGATIVE
Glucose, UA: NEGATIVE mg/dL
Hgb urine dipstick: NEGATIVE
Ketones, ur: NEGATIVE mg/dL
Leukocytes,Ua: NEGATIVE
Nitrite: NEGATIVE
Protein, ur: 30 mg/dL — AB
Specific Gravity, Urine: 1.017 (ref 1.005–1.030)
pH: 6 (ref 5.0–8.0)

## 2022-08-14 LAB — BASIC METABOLIC PANEL
Anion gap: 10 (ref 5–15)
BUN: 31 mg/dL — ABNORMAL HIGH (ref 8–23)
CO2: 24 mmol/L (ref 22–32)
Calcium: 9.1 mg/dL (ref 8.9–10.3)
Chloride: 103 mmol/L (ref 98–111)
Creatinine, Ser: 2.69 mg/dL — ABNORMAL HIGH (ref 0.61–1.24)
GFR, Estimated: 23 mL/min — ABNORMAL LOW (ref >60)
Glucose, Bld: 104 mg/dL — ABNORMAL HIGH (ref 70–99)
Potassium: 4.6 mmol/L (ref 3.5–5.1)
Sodium: 137 mmol/L (ref 135–145)

## 2022-08-14 LAB — RESP PANEL BY RT-PCR (FLU A&B, COVID) ARPGX2
Influenza A by PCR: NEGATIVE
Influenza B by PCR: NEGATIVE
SARS Coronavirus 2 by RT PCR: NEGATIVE

## 2022-08-14 LAB — CBC
HCT: 28.6 % — ABNORMAL LOW (ref 39.0–52.0)
Hemoglobin: 8.6 g/dL — ABNORMAL LOW (ref 13.0–17.0)
MCH: 27.9 pg (ref 26.0–34.0)
MCHC: 30.1 g/dL (ref 30.0–36.0)
MCV: 92.9 fL (ref 80.0–100.0)
Platelets: 136 10*3/uL — ABNORMAL LOW (ref 150–400)
RBC: 3.08 MIL/uL — ABNORMAL LOW (ref 4.22–5.81)
RDW: 16.1 % — ABNORMAL HIGH (ref 11.5–15.5)
WBC: 3.8 10*3/uL — ABNORMAL LOW (ref 4.0–10.5)
nRBC: 0 % (ref 0.0–0.2)

## 2022-08-14 LAB — PROCALCITONIN: Procalcitonin: 0.37 ng/mL

## 2022-08-14 LAB — BRAIN NATRIURETIC PEPTIDE: B Natriuretic Peptide: 923.6 pg/mL — ABNORMAL HIGH (ref 0.0–100.0)

## 2022-08-14 LAB — PROTIME-INR
INR: 1.1 (ref 0.8–1.2)
Prothrombin Time: 14.5 seconds (ref 11.4–15.2)

## 2022-08-14 LAB — TROPONIN I (HIGH SENSITIVITY)
Troponin I (High Sensitivity): 31 ng/L — ABNORMAL HIGH (ref <18)
Troponin I (High Sensitivity): 30 ng/L — ABNORMAL HIGH (ref <18)

## 2022-08-14 LAB — APTT: aPTT: 33 seconds (ref 24–36)

## 2022-08-14 LAB — LACTIC ACID, PLASMA: Lactic Acid, Venous: 0.9 mmol/L (ref 0.5–1.9)

## 2022-08-14 MED ORDER — METHOCARBAMOL 500 MG PO TABS
500.0000 mg | ORAL_TABLET | Freq: Three times a day (TID) | ORAL | Status: DC | PRN
  Administered 2022-08-14 – 2022-08-19 (×7): 500 mg via ORAL
  Filled 2022-08-14 (×8): qty 1

## 2022-08-14 MED ORDER — ATORVASTATIN CALCIUM 20 MG PO TABS
40.0000 mg | ORAL_TABLET | Freq: Every day | ORAL | Status: DC
  Administered 2022-08-14 – 2022-08-31 (×18): 40 mg via ORAL
  Filled 2022-08-14 (×18): qty 2

## 2022-08-14 MED ORDER — DOCUSATE SODIUM 100 MG PO CAPS
300.0000 mg | ORAL_CAPSULE | Freq: Every day | ORAL | Status: DC
  Administered 2022-08-15: 300 mg via ORAL
  Filled 2022-08-14: qty 3

## 2022-08-14 MED ORDER — IPRATROPIUM-ALBUTEROL 0.5-2.5 (3) MG/3ML IN SOLN
3.0000 mL | Freq: Four times a day (QID) | RESPIRATORY_TRACT | Status: DC
  Administered 2022-08-14 – 2022-08-15 (×4): 3 mL via RESPIRATORY_TRACT
  Filled 2022-08-14 (×4): qty 3

## 2022-08-14 MED ORDER — ONDANSETRON HCL 4 MG/2ML IJ SOLN
4.0000 mg | Freq: Once | INTRAMUSCULAR | Status: AC
  Administered 2022-08-14: 4 mg via INTRAVENOUS
  Filled 2022-08-14: qty 2

## 2022-08-14 MED ORDER — IPRATROPIUM BROMIDE 0.06 % NA SOLN
2.0000 | Freq: Every day | NASAL | Status: DC
  Administered 2022-08-14 – 2022-08-31 (×13): 2 via NASAL
  Filled 2022-08-14: qty 15

## 2022-08-14 MED ORDER — ZOLPIDEM TARTRATE 5 MG PO TABS
10.0000 mg | ORAL_TABLET | Freq: Every day | ORAL | Status: DC
  Administered 2022-08-14 – 2022-08-31 (×18): 10 mg via ORAL
  Filled 2022-08-14 (×18): qty 2

## 2022-08-14 MED ORDER — ONDANSETRON HCL 4 MG/2ML IJ SOLN: 4.0000 mg | Freq: Three times a day (TID) | INTRAMUSCULAR | Status: DC | PRN

## 2022-08-14 MED ORDER — METHOCARBAMOL 500 MG PO TABS
500.0000 mg | ORAL_TABLET | Freq: Every day | ORAL | Status: DC
  Administered 2022-08-14 – 2022-08-19 (×6): 500 mg via ORAL
  Filled 2022-08-14 (×6): qty 1

## 2022-08-14 MED ORDER — MORPHINE SULFATE (PF) 2 MG/ML IV SOLN
2.0000 mg | Freq: Once | INTRAVENOUS | Status: AC
  Administered 2022-08-14: 2 mg via INTRAVENOUS
  Filled 2022-08-14: qty 1

## 2022-08-14 MED ORDER — DIAZEPAM 5 MG/ML IJ SOLN
2.0000 mg | Freq: Once | INTRAMUSCULAR | Status: AC
  Administered 2022-08-14: 2 mg via INTRAVENOUS
  Filled 2022-08-14: qty 2

## 2022-08-14 MED ORDER — ADULT MULTIVITAMIN W/MINERALS CH
1.0000 | ORAL_TABLET | Freq: Every day | ORAL | Status: DC
  Administered 2022-08-15 – 2022-09-01 (×17): 1 via ORAL
  Filled 2022-08-14 (×16): qty 1

## 2022-08-14 MED ORDER — CALCITRIOL 0.25 MCG PO CAPS
0.2500 ug | ORAL_CAPSULE | Freq: Every morning | ORAL | Status: DC
  Administered 2022-08-15 – 2022-09-01 (×17): 0.25 ug via ORAL
  Filled 2022-08-14 (×18): qty 1

## 2022-08-14 MED ORDER — LIDOCAINE 5 % EX PTCH
1.0000 | MEDICATED_PATCH | CUTANEOUS | Status: DC
  Administered 2022-08-14 – 2022-09-01 (×17): 1 via TRANSDERMAL
  Filled 2022-08-14 (×19): qty 1

## 2022-08-14 MED ORDER — DM-GUAIFENESIN ER 30-600 MG PO TB12
1.0000 | ORAL_TABLET | Freq: Two times a day (BID) | ORAL | Status: DC | PRN
  Administered 2022-08-23 – 2022-09-01 (×2): 1 via ORAL
  Filled 2022-08-14 (×4): qty 1

## 2022-08-14 MED ORDER — AZITHROMYCIN 250 MG PO TABS
250.0000 mg | ORAL_TABLET | Freq: Every day | ORAL | Status: DC
  Administered 2022-08-15 – 2022-08-16 (×2): 250 mg via ORAL
  Filled 2022-08-14 (×2): qty 1

## 2022-08-14 MED ORDER — IOHEXOL 350 MG/ML SOLN
100.0000 mL | Freq: Once | INTRAVENOUS | Status: AC | PRN
  Administered 2022-08-14: 75 mL via INTRAVENOUS

## 2022-08-14 MED ORDER — ACETAMINOPHEN 325 MG PO TABS
650.0000 mg | ORAL_TABLET | Freq: Four times a day (QID) | ORAL | Status: DC | PRN
  Administered 2022-08-14 – 2022-08-17 (×2): 650 mg via ORAL
  Filled 2022-08-14 (×2): qty 2

## 2022-08-14 MED ORDER — OXYCODONE-ACETAMINOPHEN 5-325 MG PO TABS
1.0000 | ORAL_TABLET | Freq: Once | ORAL | Status: AC
  Administered 2022-08-14: 1 via ORAL
  Filled 2022-08-14: qty 1

## 2022-08-14 MED ORDER — TRAMADOL HCL 50 MG PO TABS
50.0000 mg | ORAL_TABLET | Freq: Four times a day (QID) | ORAL | Status: DC
  Administered 2022-08-14 – 2022-08-17 (×7): 50 mg via ORAL
  Filled 2022-08-14 (×7): qty 1

## 2022-08-14 MED ORDER — PREDNISONE 20 MG PO TABS
40.0000 mg | ORAL_TABLET | Freq: Every day | ORAL | Status: DC
  Administered 2022-08-15 – 2022-08-24 (×9): 40 mg via ORAL
  Filled 2022-08-14 (×9): qty 2

## 2022-08-14 MED ORDER — METHYLPREDNISOLONE SODIUM SUCC 125 MG IJ SOLR
80.0000 mg | Freq: Once | INTRAMUSCULAR | Status: AC
  Administered 2022-08-14: 80 mg via INTRAVENOUS
  Filled 2022-08-14: qty 2

## 2022-08-14 MED ORDER — AZITHROMYCIN 500 MG PO TABS
500.0000 mg | ORAL_TABLET | Freq: Every day | ORAL | Status: AC
  Administered 2022-08-14: 500 mg via ORAL
  Filled 2022-08-14: qty 1

## 2022-08-14 MED ORDER — SODIUM CHLORIDE 0.9 % IV SOLN
INTRAVENOUS | Status: DC
  Administered 2022-08-15: 75 mL via INTRAVENOUS

## 2022-08-14 MED ORDER — ALPRAZOLAM 0.25 MG PO TABS
0.2500 mg | ORAL_TABLET | Freq: Two times a day (BID) | ORAL | Status: DC | PRN
  Administered 2022-08-14 – 2022-08-20 (×3): 0.25 mg via ORAL
  Filled 2022-08-14 (×4): qty 1

## 2022-08-14 MED ORDER — IPRATROPIUM-ALBUTEROL 0.5-2.5 (3) MG/3ML IN SOLN
3.0000 mL | Freq: Once | RESPIRATORY_TRACT | Status: AC
  Administered 2022-08-14: 3 mL via RESPIRATORY_TRACT
  Filled 2022-08-14: qty 3

## 2022-08-14 MED ORDER — DONEPEZIL HCL 5 MG PO TABS
10.0000 mg | ORAL_TABLET | Freq: Every day | ORAL | Status: DC
  Administered 2022-08-14 – 2022-08-31 (×17): 10 mg via ORAL
  Filled 2022-08-14 (×18): qty 2

## 2022-08-14 MED ORDER — SODIUM CHLORIDE 0.9 % IV SOLN
2.0000 g | Freq: Three times a day (TID) | INTRAVENOUS | Status: DC
  Administered 2022-08-14 – 2022-08-19 (×13): 2 g via INTRAVENOUS
  Filled 2022-08-14 (×4): qty 2000
  Filled 2022-08-14: qty 2
  Filled 2022-08-14: qty 2000
  Filled 2022-08-14 (×2): qty 2
  Filled 2022-08-14 (×8): qty 2000

## 2022-08-14 MED ORDER — MELATONIN 5 MG PO TABS
10.0000 mg | ORAL_TABLET | Freq: Every day | ORAL | Status: DC
  Administered 2022-08-14 – 2022-08-31 (×18): 10 mg via ORAL
  Filled 2022-08-14 (×18): qty 2

## 2022-08-14 MED ORDER — LORATADINE 10 MG PO TABS
10.0000 mg | ORAL_TABLET | Freq: Every day | ORAL | Status: DC
  Administered 2022-08-14 – 2022-08-31 (×18): 10 mg via ORAL
  Filled 2022-08-14 (×18): qty 1

## 2022-08-14 MED ORDER — METOPROLOL SUCCINATE ER 25 MG PO TB24
25.0000 mg | ORAL_TABLET | Freq: Every day | ORAL | Status: DC
  Administered 2022-08-14 – 2022-09-01 (×19): 25 mg via ORAL
  Filled 2022-08-14 (×19): qty 1

## 2022-08-14 MED ORDER — PANTOPRAZOLE SODIUM 40 MG PO TBEC
40.0000 mg | DELAYED_RELEASE_TABLET | Freq: Every day | ORAL | Status: DC
  Administered 2022-08-15 – 2022-09-01 (×17): 40 mg via ORAL
  Filled 2022-08-14 (×17): qty 1

## 2022-08-14 MED ORDER — SODIUM CHLORIDE 0.9 % IV BOLUS
1000.0000 mL | Freq: Once | INTRAVENOUS | Status: AC
  Administered 2022-08-14: 1000 mL via INTRAVENOUS

## 2022-08-14 MED ORDER — FERROUS SULFATE 325 (65 FE) MG PO TABS
325.0000 mg | ORAL_TABLET | Freq: Every day | ORAL | Status: DC
  Administered 2022-08-14 – 2022-08-31 (×18): 325 mg via ORAL
  Filled 2022-08-14 (×19): qty 1

## 2022-08-14 MED ORDER — OXYCODONE-ACETAMINOPHEN 5-325 MG PO TABS
1.0000 | ORAL_TABLET | Freq: Four times a day (QID) | ORAL | Status: DC | PRN
  Administered 2022-08-14 – 2022-08-17 (×7): 1 via ORAL
  Filled 2022-08-14 (×7): qty 1

## 2022-08-14 MED ORDER — ALBUTEROL SULFATE (2.5 MG/3ML) 0.083% IN NEBU: 2.5000 mg | INHALATION_SOLUTION | RESPIRATORY_TRACT | Status: DC | PRN

## 2022-08-14 MED ORDER — HYDRALAZINE HCL 20 MG/ML IJ SOLN
5.0000 mg | INTRAMUSCULAR | Status: DC | PRN
  Administered 2022-08-18 – 2022-08-21 (×5): 5 mg via INTRAVENOUS
  Filled 2022-08-14 (×5): qty 1

## 2022-08-14 MED ORDER — EZETIMIBE 10 MG PO TABS
10.0000 mg | ORAL_TABLET | Freq: Every evening | ORAL | Status: DC
  Administered 2022-08-14 – 2022-08-31 (×18): 10 mg via ORAL
  Filled 2022-08-14 (×18): qty 1

## 2022-08-14 NOTE — ED Notes (Signed)
ED Provider at bedside. 

## 2022-08-14 NOTE — Assessment & Plan Note (Addendum)
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 S/p repair on 08/16/2022 by vascular surgery. Patient had AAA repair done in 2018 by Dr. Lucky Cowboy. -Continue to monitor

## 2022-08-14 NOTE — Assessment & Plan Note (Addendum)
-   Continue home statin and Plavix

## 2022-08-14 NOTE — Assessment & Plan Note (Addendum)
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 Patient had mildly positive troponin with a flat curve most likely secondary to demand ischemia.  No chest pain. -Continue home Lipitor and Zetia.

## 2022-08-14 NOTE — ED Notes (Signed)
Pt brought to ED rm 8 at this time, this RN now assuming care.

## 2022-08-14 NOTE — H&P (Signed)
History and Physical    Rick Mcbride. HAL:[id number] DOB: [birth year] DOA: 08/14/2022  Referring MD/NP/PA:   PCP: Idelle Crouch, MD   Patient coming from:  The patient is coming from home.  At baseline, pt is independent for most of ADL.        Chief Complaint: lower back pain and SOB  HPI: Rick Mcbride. is a 81 y.o. 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  Patient was recently hospitalized from 8/2 - 8/8 due to right femoral neck fracture.  Patient had surgery and finished the rehab.  Currently he is doing PT/OT at home.  He states that in the past several days, his lower back pain has worsened, difficult walking and doing physical therapy.  The lower back pain is constant, 7 out of 10 in severity, aching, nonradiating.  No loss control of bladder or bowel movement.  Denies new injury.  Patient also reports shortness of breath and dry cough.  No chest pain, fever or chills.  Denies nausea, vomiting, diarrhea or abdominal pain. Pt still has some mild right hip pain.  No symptoms of UTI. Pt had negative PE by V/Q scan which is ordered by his PCP yesterday.  Data reviewed independently and ED Course: pt was found to have WBC 3.8, negative COVID PCR, lactic acid 0.9, troponin level 31, 30, slightly worsening renal function with creatinine 2.69, BUN 31, GFR 23 (recent baseline creatinine 2.39 06/15/2022), temperature normal, blood pressure 172/84, 129/64, RR 83, heart rate 83, RR 17, oxygen saturation 98% on room air.  Chest x-ray showed  possible mild patchy infiltration in left lower lobe.  CT head negative.  CT scan showed enlargement of AAA from previous 6.9 to 7.6 cm now.  Patient is admitted to telemetry bed as inpatient. Dr. Lorenso Courier of [facility] is consulted    CT chest and abdomen/pelvis:  1. COPD and mild chronic bronchitis with prior right lower lobectomy. No focal pneumonia is seen. Chronic prominence of the pulmonary trunk. 2. Aortic and coronary artery atherosclerosis, prior CABG, pacemaker, and abdominal aortobi-iliac stent grafting. 3. 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 Annual CTA or MRA follow-up recommended improved 4. 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 CTA would be the preferred imaging workup, MRA if the patient cannot have IV contrast. 5. Constipation and diverticulosis. 6. Nonobstructive nephrolithiasis. 7. Degenerative and postsurgical changes of the spine and osteopenia. No acute spinal compression fracture.  8. Musculoskeletal: Chronic L5-S1 fusion hardware is again noted with chronic L5 spondylolysis and grade 2 L5-S1 spondylolisthesis, unchanged. There is osteopenia degenerative change of the lumbar spine with discogenic mild grade 1 retrolisthesis unchanged at L1-2 and L2-3, slight dextroscoliosis.    EKG: I have personally reviewed.  Paced rhythm, QTc 452   Review of Systems:   General: no fevers, chills, no body weight gain, has fatigue HEENT: no blurry vision, hearing changes or sore throat Respiratory: has  dyspnea, coughing, wheezing CV: no chest pain, no palpitations GI: no nausea, vomiting, abdominal pain, diarrhea,  constipation GU: no dysuria, burning on urination, increased urinary frequency, hematuria  Ext: no leg edema Neuro: no unilateral weakness, numbness, or tingling, no vision change or hearing loss Skin: no rash, no skin tear. MSK: No muscle spasm, no deformity, no limitation of range of  movement in spin. Has lower back pain and right hip pain. Heme: No easy bruising.  Travel history: No recent long distant travel.   Allergy: No Known Allergies  Past Medical History:  Diagnosis Date   AAA (abdominal aortic aneurysm) ([place])    a.) s/p EVAR 01/05/2017. b.) native aneurysm sac 6.6 x 6.9 cm by CT on 03/31/2021   Abnormality of tongue    a.) CT head/neck 07/10/2021 --> asymmetric soft tissue at the RIGHT tongue base with a superficial 8 mm lesion.   Anemia    Aneurysm of right common iliac artery (HCC)    a.) measured 2.7 cm by CT on 03/31/2021   Anxiety    Aortic atherosclerosis (HCC)    Atrophic kidney    B12 deficiency    CAD (coronary artery disease)    Cervical radiculopathy    Chronic airway obstruction (HCC)    Chronic kidney disease (CKD), stage III (moderate) (HCC)    Chronic pain syndrome 06/16/2021   Chronic right shoulder pain 11/12/2019   Chronic tension headaches    Chronic, continuous use of opioids 06/16/2021   Coronary artery disease    DDD (degenerative disc disease), lumbar    Degenerative disc disease, lumbar    with lumbar radiculopathy   Elbow fracture, left    GERD (gastroesophageal reflux disease)    H/O adenomatous polyp of colon    H/O hemorrhoids    HTN (hypertension)    Hyperlipidemia    Meralgia paresthetica    Mild cognitive impairment    Nephrolithiasis    Neuralgia    Numbness of right foot 02/19/2021   Osteoarthritis    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   Presence of permanent cardiac pacemaker    Prostate cancer [facility])    a.) s/p prostatectomy   S/P CABG x 3 05/01/2004   a.) LVEF 40-49%; LIMA-LAD, SVG-OM1, SVG-PDA   Second degree AV block    Sinoatrial node dysfunction (HCC)    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[place]) 01/31/2013   a.) RLL squamous cell carcinoma   Status post partial lobectomy of lung    a.) s/p RLL resection on 01/19/2013   Stroke [facility])    TIA (transient ischemic attack)     Valvular regurgitation    a.) TTE 10/24/2019 --> LVEF 45-50%; trivial TR, mild AR and MR; moderate LA dilitation.    Past Surgical History:  Procedure Laterality Date   CATARACT EXTRACTION Bilateral    COLONOSCOPY     COLONOSCOPY     COLONOSCOPY WITH PROPOFOL N/A 10/20/2015   Procedure: COLONOSCOPY WITH PROPOFOL;  Surgeon: Manya Silvas, MD;  Location: [facility] ENDOSCOPY;  Service: Endoscopy;  Laterality: N/A;   COLONOSCOPY WITH PROPOFOL N/A 11/28/2020   Procedure: COLONOSCOPY WITH PROPOFOL;  Surgeon: Robert Bellow, MD;  Location: [facility] ENDOSCOPY;  Service: Endoscopy;  Laterality: N/A;   CORONARY ARTERY BYPASS GRAFT N/A 05/01/2004   Procedure: 3v CABG (LIMA-LAD, SVG-OM1, SVG-PDA); Location: [facility]; Surgeon: Ander Gaster, MD   EMBOLIZATION Right 12/27/2016   Procedure: Embolization;  Surgeon: Algernon Huxley, MD;  Location: [facility] CV LAB;  Service: Cardiovascular;  Laterality: Right;   ENDOVASCULAR REPAIR/STENT GRAFT N/A 01/05/2017  Procedure: Endovascular Repair/Stent Graft;  Surgeon: Algernon Huxley, MD;  Location: [place] CV LAB;  Service: Cardiovascular;  Laterality: N/A;   HIP ARTHROPLASTY Right 06/10/2022   Procedure: ARTHROPLASTY BIPOLAR HIP (HEMIARTHROPLASTY);  Surgeon: [name], MD;  Location: [facility] ORS;  Service: Orthopedics;  Laterality: Right;   INSERT / REPLACE / REMOVE PACEMAKER     JOINT REPLACEMENT     shoulder and knees   KNEE ARTHROSCOPY     LUNG LOBECTOMY Right 01/31/2013   Procedure: RIGHT PULMONARY LOBECTOMY; Location: [place]; Surgeon: Nestor Lewandowsky, MD   MICROLARYNGOSCOPY Right 08/10/2021   Procedure: MICRODIRECT LARYNGOSCOPY WITH BIOPSY OF TONGUE BASE;  Surgeon: Beverly Gust, MD;  Location: [facility] ORS;  Service: ENT;  Laterality: Right;   PACEMAKER INSERTION  12/2012   Dual chanber pacemaker generator   POLYPECTOMY     POSTERIOR LUMBAR FUSION     Procedure: POSTERIOR LUMBAR INTERBODY FUSION, INTERBODY PROSTHESIS, POSTERIOR LATERAL ARTHRODESIS,  POSTERIOR NON-SEGMENTAL INSTRUMENTATION LUMBAR FIVE- SACRAL ONE; Location: [facility]; Surgeon: Newman Pies, MD   PROSTATECTOMY     TONGUE BIOPSY N/A 08/10/2021   Procedure: TONGUE BIOPSY;  Surgeon: Beverly Gust, MD;  Location: [facility] ORS;  Service: ENT;  Laterality: N/A;   TOTAL KNEE ARTHROPLASTY Bilateral    TOTAL SHOULDER ARTHROPLASTY Left 07/10/2015   Procedure: TOTAL SHOULDER ARTHROPLASTY;  Surgeon: [name], MD;  Location: [facility] ORS;  Service: Orthopedics;  Laterality: Left;   TOTAL SHOULDER REPLACEMENT      Social History:  reports that he quit smoking about 18 years ago. His smoking use included cigarettes. He has a 67.50 pack-year smoking history. He has never used smokeless tobacco. He reports that he does not drink alcohol and does not use drugs.  Family History:  Family History  Problem Relation Age of Onset   Heart attack Mother    Heart attack Father    Breast cancer Sister    Asthma Sister      Prior to Admission medications   Medication Sig Start Date End Date Taking? Authorizing Provider  acetaminophen (TYLENOL) 500 MG tablet Take 500 mg by mouth every 4 (four) hours as needed for moderate pain.    [provider]  apixaban (ELIQUIS) 2.5 MG TABS tablet Take 1 tablet (2.5 mg total) by mouth 2 (two) times daily. 06/12/22   Lattie Corns, PA-C  aspirin EC 81 MG tablet Take 81 mg by mouth daily.     [provider]  atorvastatin (LIPITOR) 20 MG tablet Take 40 mg by mouth at bedtime.    [provider]  calcitRIOL (ROCALTROL) 0.25 MCG capsule Take 0.25 mcg by mouth daily. 05/12/22   [provider]  Calcium Carb-Cholecalciferol (CALCIUM 600 + D PO) Take 1 tablet by mouth daily.    [provider]  cetirizine (ZYRTEC) 10 MG tablet Take 10 mg by mouth at bedtime.    [provider]  chlorproMAZINE (THORAZINE) 10 MG tablet Take 1 tablet (10 mg total) by mouth 4 (four) times daily for 2 days. 06/15/22 06/30/22   Annita Brod, MD  clopidogrel (PLAVIX) 75 MG tablet Take 1 tablet (75 mg total) by mouth daily. Restart Plavix after completion of Eliquis prescription. 06/12/22   Lattie Corns, PA-C  docusate sodium (COLACE) 100 MG capsule Take 300 mg by mouth at bedtime.    [provider]  donepezil (ARICEPT) 10 MG tablet Take 10 mg by mouth at bedtime. 05/12/22   [provider]  ezetimibe (ZETIA) 10  MG tablet Take 10 mg by mouth daily. 10/17/18 06/30/22  [provider]  feeding supplement (ENSURE ENLIVE / ENSURE PLUS) LIQD Take 237 mLs by mouth 2 (two) times daily between meals. 06/15/22   Annita Brod, MD  Ferrous Sulfate (IRON SLOW RELEASE) 140 (45 Fe) MG TBCR Take 1 tablet by mouth 2 (two) times daily.    [provider]  loratadine (CLARITIN) 10 MG tablet Take 10 mg by mouth daily. 05/15/22   [provider]  losartan (COZAAR) 100 MG tablet Take 100 mg by mouth daily.    [provider]  melatonin 5 MG TABS Take 10 mg by mouth at bedtime.    [provider]  methocarbamol (ROBAXIN) 500 MG tablet Take 1 tablet (500 mg total) by mouth at bedtime as needed for muscle spasms. 06/15/22   Annita Brod, MD  metoCLOPramide (REGLAN) 5 MG tablet Take 1 tablet (5 mg total) by mouth 3 (three) times daily for 2 days. Patient not taking: Reported on 06/30/2022 06/15/22 06/17/22  Annita Brod, MD  metoprolol succinate (TOPROL-XL) 25 MG 24 hr tablet Take 25 mg by mouth daily. 05/24/17   [provider]  Multiple Vitamin (MULTIVITAMIN WITH MINERALS) TABS tablet Take 1 tablet by mouth daily.     [provider]  Multiple Vitamins-Minerals (PRESERVISION AREDS 2) CAPS Take 1 tablet by mouth 2 (two) times daily.    [provider]  ondansetron (ZOFRAN) 4 MG tablet Take 1 tablet (4 mg total) by mouth every 6 (six) hours as needed for nausea. 06/12/22   Lattie Corns, PA-C  pantoprazole (PROTONIX) 40 MG tablet Take 40 mg  by mouth daily.    [provider]  polyethylene glycol (MIRALAX) 17 g packet Take 17 g by mouth daily as needed for mild constipation. 06/15/22   Annita Brod, MD  Probiotic Product (PROBIOTIC PO) Take 1 capsule by mouth daily.    [provider]  traMADol (ULTRAM) 50 MG tablet Take 1 tablet (50 mg total) by mouth every 6 (six) hours as needed for moderate pain. 06/14/22   Lattie Corns, PA-C  zolpidem (AMBIEN) 10 MG tablet Take 1 tablet (10 mg total) by mouth at bedtime. 06/15/22   Annita Brod, MD    Physical Exam: Vitals:   08/14/22 1647 08/14/22 1800 08/14/22 1840 08/14/22 1949  BP:  (!) 114/55 (!) 145/68 139/64  Pulse: 70 64 64 (!) 59  Resp:    16  Temp:   98.3 F (36.8 C) 98.1 F (36.7 C)  TempSrc:   Oral   SpO2: 97% 100% 100% 99%  Weight:      Height:       General: Not in acute distress HEENT:       Eyes: PERRL, EOMI, no scleral icterus.       ENT: No discharge from the ears and nose, no pharynx injection, no tonsillar enlargement.        Neck: No JVD, no bruit, no mass felt. Heme: No neck lymph node enlargement. Cardiac: S1/S2, RRR, No murmurs, No gallops or rubs. Respiratory: Has mild wheezing bilaterally GI: Soft, nondistended, nontender, no rebound pain, no organomegaly, BS present. GU: No hematuria Ext: No pitting leg edema bilaterally. 1+DP/PT pulse bilaterally. Musculoskeletal: has tenderness in lower back Skin: No rashes.  Neuro: Alert, oriented X3, cranial nerves II-XII grossly intact, moves all extremities. Psych: Patient is not psychotic, no suicidal or hemocidal ideation.  Labs on Admission: I have personally  reviewed following labs and imaging studies  CBC: Recent Labs  Lab 08/14/22 0247  WBC 3.8*  HGB 8.6*  HCT 28.6*  MCV 92.9  PLT 371*   Basic Metabolic Panel: Recent Labs  Lab 08/14/22 0247  NA 137  K 4.6  CL 103  CO2 24  GLUCOSE 104*  BUN 31*  CREATININE 2.69*  CALCIUM 9.1   GFR: Estimated Creatinine  Clearance: 25.9 mL/min (A) (by C-G formula based on SCr of 2.69 mg/dL (H)). Liver Function Tests: No results for input(s): "AST", "ALT", "ALKPHOS", "BILITOT", "PROT", "ALBUMIN" in the last 168 hours. No results for input(s): "LIPASE", "AMYLASE" in the last 168 hours. No results for input(s): "AMMONIA" in the last 168 hours. Coagulation Profile: No results for input(s): "INR", "PROTIME" in the last 168 hours. Cardiac Enzymes: No results for input(s): "CKTOTAL", "CKMB", "CKMBINDEX", "TROPONINI" in the last 168 hours. BNP (last 3 results) No results for input(s): "PROBNP" in the last 8760 hours. HbA1C: No results for input(s): "HGBA1C" in the last 72 hours. CBG: No results for input(s): "GLUCAP" in the last 168 hours. Lipid Profile: No results for input(s): "CHOL", "HDL", "LDLCALC", "TRIG", "CHOLHDL", "LDLDIRECT" in the last 72 hours. Thyroid Function Tests: No results for input(s): "TSH", "T4TOTAL", "FREET4", "T3FREE", "THYROIDAB" in the last 72 hours. Anemia Panel: No results for input(s): "VITAMINB12", "FOLATE", "FERRITIN", "TIBC", "IRON", "RETICCTPCT" in the last 72 hours. Urine analysis:    Component Value Date/Time   COLORURINE YELLOW (A) 08/14/2022 0654   APPEARANCEUR CLEAR (A) 08/14/2022 0654   APPEARANCEUR Cloudy (A) 03/09/2021 1332   LABSPEC 1.017 08/14/2022 0654   LABSPEC 1.015 10/25/2014 1330   PHURINE 6.0 08/14/2022 0654   GLUCOSEU NEGATIVE 08/14/2022 0654   GLUCOSEU Negative 10/25/2014 1330   HGBUR NEGATIVE 08/14/2022 0654   BILIRUBINUR NEGATIVE 08/14/2022 0654   BILIRUBINUR Negative 03/09/2021 1332   BILIRUBINUR Negative 10/25/2014 [place] 08/14/2022 0654   PROTEINUR 30 (A) 08/14/2022 0654   NITRITE NEGATIVE 08/14/2022 0654   LEUKOCYTESUR NEGATIVE 08/14/2022 0654   LEUKOCYTESUR Negative 10/25/2014 1330   Sepsis Labs: @LABRCNTIP (procalcitonin:4,lacticidven:4) ) Recent Results (from the past 240 hour(s))  Culture, blood (routine x 2)      Status: None (Preliminary result)   Collection Time: 08/14/22  3:57 AM   Specimen: BLOOD  Result Value Ref Range Status   Specimen Description BLOOD BLOOD RIGHT ARM  Final   Special Requests   Final    BOTTLES DRAWN AEROBIC AND ANAEROBIC Blood Culture adequate volume   Culture  Setup Time   Final    Organism ID to follow [place] AND ANAEROBIC BOTTLES CRITICAL RESULT CALLED TO, READ BACK BY AND VERIFIED WITH: PHARMD CHILDS AT 1957 08/14/2022 GAA Performed at [facility], [address] [place],  [address]    Culture GRAM POSITIVE COCCI  Final   Report Status PENDING  Incomplete  Culture, blood (routine x 2)     Status: None (Preliminary result)   Collection Time: 08/14/22  3:57 AM   Specimen: BLOOD  Result Value Ref Range Status   Specimen Description BLOOD BLOOD LEFT ARM  Final   Special Requests   Final    BOTTLES DRAWN AEROBIC AND ANAEROBIC Blood Culture adequate volume   Culture  Setup Time   Final    GRAM POSITIVE COCCI AEROBIC BOTTLE ONLY CRITICAL RESULT CALLED TO, READ BACK BY AND VERIFIED WITH: PHARMD CHILDS AT 1957 08/14/2022 GAA GRAM STAIN REVIEWED-AGREE WITH RESULT Performed at [facility]  Lab, [address], [place], [place] [address]    Culture GRAM POSITIVE COCCI  Final   Report Status PENDING  Incomplete  Resp Panel by RT-PCR (Flu A&B, Covid) Anterior Nasal Swab     Status: None   Collection Time: 08/14/22  3:57 AM   Specimen: Anterior Nasal Swab  Result Value Ref Range Status   SARS Coronavirus 2 by RT PCR NEGATIVE NEGATIVE Final    Comment: (NOTE) SARS-CoV-2 target nucleic acids are NOT DETECTED.  The SARS-CoV-2 RNA is generally detectable in upper respiratory specimens during the acute phase of infection. The lowest concentration of SARS-CoV-2 viral copies this assay can detect is 138 copies/mL. A negative result does not preclude SARS-Cov-2 infection and should not be used as the sole basis for  treatment or other patient management decisions. A negative result may occur with  improper specimen collection/handling, submission of specimen other than nasopharyngeal swab, presence of viral mutation(s) within the areas targeted by this assay, and inadequate number of viral copies(<138 copies/mL). A negative result must be combined with clinical observations, patient history, and epidemiological information. The expected result is Negative.  Fact Sheet for Patients:  EntrepreneurPulse.com.au  Fact Sheet for Healthcare Providers:  IncredibleEmployment.be  This test is no t yet approved or cleared by the [place] FDA and  has been authorized for detection and/or diagnosis of SARS-CoV-2 by FDA under an Emergency Use Authorization (EUA). This EUA will remain  in effect (meaning this test can be used) for the duration of the COVID-19 declaration under Section 564(b)(1) of the Act, 21 U.S.C.section 360bbb-3(b)(1), unless the authorization is terminated  or revoked sooner.       Influenza A by PCR NEGATIVE NEGATIVE Final   Influenza B by PCR NEGATIVE NEGATIVE Final    Comment: (NOTE) The Xpert Xpress SARS-CoV-2/FLU/RSV plus assay is intended as an aid in the diagnosis of influenza from Nasopharyngeal swab specimens and should not be used as a sole basis for treatment. Nasal washings and aspirates are unacceptable for Xpert Xpress SARS-CoV-2/FLU/RSV testing.  Fact Sheet for Patients: EntrepreneurPulse.com.au  Fact Sheet for Healthcare Providers: IncredibleEmployment.be  This test is not yet approved or cleared by the [place] FDA and has been authorized for detection and/or diagnosis of SARS-CoV-2 by FDA under an Emergency Use Authorization (EUA). This EUA will remain in effect (meaning this test can be used) for the duration of the COVID-19 declaration under Section 564(b)(1) of the Act, 21  U.S.C. section 360bbb-3(b)(1), unless the authorization is terminated or revoked.  Performed at [facility], [address] [place], [place] [address]   Blood Culture ID Panel (Reflexed)     Status: Abnormal   Collection Time: 08/14/22  3:57 AM  Result Value Ref Range Status   Enterococcus faecalis DETECTED (A) NOT DETECTED Final    Comment: CRITICAL RESULT CALLED TO, READ BACK BY AND VERIFIED WITH: PHARMD CHILDS AT 1957 08/14/2022 GAA    Enterococcus Faecium NOT DETECTED NOT DETECTED Final   Listeria monocytogenes NOT DETECTED NOT DETECTED Final   Staphylococcus species NOT DETECTED NOT DETECTED Final   Staphylococcus aureus (BCID) NOT DETECTED NOT DETECTED Final   Staphylococcus epidermidis NOT DETECTED NOT DETECTED Final   Staphylococcus lugdunensis NOT DETECTED NOT DETECTED Final   Streptococcus species NOT DETECTED NOT DETECTED Final   Streptococcus agalactiae NOT DETECTED NOT DETECTED Final   Streptococcus pneumoniae NOT DETECTED NOT DETECTED Final   Streptococcus pyogenes NOT DETECTED NOT DETECTED Final   A.calcoaceticus-baumannii NOT DETECTED NOT DETECTED  Final   Bacteroides fragilis NOT DETECTED NOT DETECTED Final   Enterobacterales NOT DETECTED NOT DETECTED Final   Enterobacter cloacae complex NOT DETECTED NOT DETECTED Final   Escherichia coli NOT DETECTED NOT DETECTED Final   Klebsiella aerogenes NOT DETECTED NOT DETECTED Final   Klebsiella oxytoca NOT DETECTED NOT DETECTED Final   Klebsiella pneumoniae NOT DETECTED NOT DETECTED Final   Proteus species NOT DETECTED NOT DETECTED Final   Salmonella species NOT DETECTED NOT DETECTED Final   Serratia marcescens NOT DETECTED NOT DETECTED Final   Haemophilus influenzae NOT DETECTED NOT DETECTED Final   Neisseria meningitidis NOT DETECTED NOT DETECTED Final   Pseudomonas aeruginosa NOT DETECTED NOT DETECTED Final   Stenotrophomonas maltophilia NOT DETECTED NOT DETECTED Final   Candida albicans NOT DETECTED NOT  DETECTED Final   Candida auris NOT DETECTED NOT DETECTED Final   Candida glabrata NOT DETECTED NOT DETECTED Final   Candida krusei NOT DETECTED NOT DETECTED Final   Candida parapsilosis NOT DETECTED NOT DETECTED Final   Candida tropicalis NOT DETECTED NOT DETECTED Final   Cryptococcus neoformans/gattii NOT DETECTED NOT DETECTED Final   Vancomycin resistance NOT DETECTED NOT DETECTED Final    Comment: Performed at [facility], [address] [place], [place] [address]     Radiological Exams on Admission: CT Angio Abd/Pel w/ and/or w/o  Result Date: 08/14/2022 CLINICAL DATA:  History of endovascular repair of an abdominal aortic aneurysm. Evaluate abdominal aortic aneurysm sac. EXAM: CTA ABDOMEN AND PELVIS WITHOUT AND WITH CONTRAST TECHNIQUE: Multidetector CT imaging of the abdomen and pelvis was performed using the standard protocol during bolus administration of intravenous contrast. Multiplanar reconstructed images and MIPs were obtained and reviewed to evaluate the vascular anatomy. RADIATION DOSE REDUCTION: This exam was performed according to the departmental dose-optimization program which includes automated exposure control, adjustment of the mA and/or kV according to patient size and/or use of iterative reconstruction technique. CONTRAST:  59mL OMNIPAQUE IOHEXOL 350 MG/ML SOLN COMPARISON:  CT chest abdomen pelvis 08/14/2022 and PET-CT 09/09/2021 FINDINGS: VASCULAR Aorta: Endovascular repair of an abdominal aortic aneurysm with a bifurcated infrarenal aortic stent graft. Aortic stent graft is patent. The aneurysm sac measures up to 7.6 cm and measured 7.0 cm on 09/09/2021. There is contrast within the posterior proximal aspect of the aneurysm sac likely coming from lumbar arteries. Large amount of contrast within the sac on the delayed images. 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 No evidence for an aortic sac rupture. Celiac: Patent without evidence of aneurysm,  dissection, vasculitis or significant stenosis. Incidentally, there is an accessory left hepatic artery coming off the left gastric artery. SMA: Approximately 50% stenosis involving the origin and proximal aspect of the SMA related to mixed plaque. SMA is patent. Renals: Both renal arteries are patent without evidence of aneurysm, dissection, vasculitis, fibromuscular dysplasia or significant stenosis. IMA: Retrograde filling of the inferior mesenteric artery and there is contrast near the origin. 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 Inflow: Left limb terminates in the distal left common iliac artery. Left internal and external iliac arteries are patent. There is focal dilatation in the left internal iliac artery measuring up to 1.2 cm. Right limb extends into the right external iliac artery and there has been endovascular embolization of the right internal iliac artery origin. 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 Right external iliac artery is patent. Proximal Outflow: Proximal femoral arteries  are patent bilaterally Veins: IVC and renal veins are patent. No gross abnormality to the iliac veins. Review of the MIP images confirms the above findings. NON-VASCULAR Lower chest: Emphysema. 8 mm nodule in the left upper lobe on sequence 7, image 28 is stable since 09/18/2019. No large pleural effusion. Again noted is a cardiac pacemaker with a lead extending through an interatrial defect and appears to be terminating in the left ventricle. The other cardiac lead is in the right atrial appendage. Pacemaker leads appear to be chronic. Post CABG changes. Hepatobiliary: Normal appearance of the liver and gallbladder. Pancreas: Unremarkable. No pancreatic ductal dilatation or surrounding inflammatory changes. Spleen: 9 mm hyper hypodensity in the spleen appears chronic no acute abnormality. Adrenals/Urinary Tract: Normal appearance of  the adrenal glands. Both kidneys are atrophic without hydronephrosis. Low-density cyst in left kidney again noted and do not require dedicated follow-up. 9 mm stone in the left kidney upper pole. Normal appearance of the urinary bladder. Small calcification in the right kidney upper pole region could be vascular in etiology. Stomach/Bowel: Rectum is distended with gas and stool. No evidence for bowel obstruction or focal bowel inflammation. Normal appearance of the stomach. Lymphatic: No significant lymph node enlargement in the abdomen or pelvis. Reproductive: Evidence for prostatectomy. Other: Negative for free fluid.  Negative for free air. Musculoskeletal: There is a intramedullary nail in the right humerus which is incompletely imaged. Right hip arthroplasty is located. Bilateral pedicle screw and rod fixation at L5-S1. Stable anterolisthesis of L5 on S1 with disc space loss at L5-S1. Disc space narrowing at L4-L5. Chronic mild retrolisthesis of L2 on L3. IMPRESSION: VASCULAR 1. 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 The bifurcated aortic stent graft is patent but the aneurysm sac is enlarging. Aneurysm sac measures up to 7.6 cm and measured 7.0 cm on 09/09/2021. 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 2. 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 3. Unusual location for a cardiac lead that appears to be extending through in interatrial defect and terminating in the left ventricle. This is a chronic finding. NON-VASCULAR 1. No acute abnormality in the abdomen or pelvis. 2. Atrophy in both kidneys. 3. Aortic Atherosclerosis (ICD10-I70.0) and Emphysema (ICD10-J43.9). 4. Nonobstructive left nephrolithiasis. Electronically Signed   By: Markus Daft M.D.   On: 08/14/2022 15:29   CT CHEST ABDOMEN PELVIS WO CONTRAST  Result Date: 08/14/2022 CLINICAL DATA:  Pneumonia, complications suspected. Low back pain onset 2 days  ago worsening with movement. Shortness of breath. EXAM: CT CHEST, ABDOMEN AND PELVIS WITHOUT CONTRAST TECHNIQUE: Multidetector CT imaging of the chest, abdomen and pelvis was performed following the standard protocol without IV contrast. RADIATION DOSE REDUCTION: This exam was performed according to the departmental dose-optimization program which includes automated exposure control, adjustment of the mA and/or kV according to patient size and/or use of iterative reconstruction technique. COMPARISON:  PET-CT skull base to pelvis 09/09/2021, CT abdomen pelvis without contrast 03/31/2021. Most recent chest x-ray was portable chest today, portable chest 08/13/2022. FINDINGS: CT CHEST FINDINGS Cardiovascular: There is mild cardiomegaly. Metal artifact from left chest dual lead pacing system and dual lead wires in the heart. There are CABG changes. Aortic tortuosity and moderate patchy arthrosclerosis with aortic root ectasia up to 3.8 cm and ascending aorta with mild dilatation to 4.2 cm. The remainder is within normal caliber limits with scattered calcific plaque in the great vessels. There is a prominent pulmonary  trunk 3.4 cm indicating arterial hypertension, unchanged. There are normal caliber pulmonary veins. Mediastinum/Nodes: No intrathoracic or axillary adenopathy. Thyroid gland obscured by metallic artifact from bilateral shoulder replacements and left chest pacemaker. Small amount of retained secretions at the right posterolateral tracheal wall, otherwise unremarkable trachea and main bronchi. Lungs/Pleura: Chronic changes in the bases and moderate emphysematous disease with both paraseptal and centrilobular changes. Right lower lobectomy with volume loss and mild asymmetric elevation of the right diaphragm. There is mild bronchial thickening. No pneumonic infiltrate or nodule is seen. No pleural effusion, thickening or pneumothorax. Musculoskeletal: There is osteopenia, degenerative disc disease and  spondylosis of the thoracic spine. As above there are bilateral shoulder replacements. CT ABDOMEN PELVIS FINDINGS Hepatobiliary: The liver is unremarkable without contrast. The gallbladder and bile ducts are unremarkable. Pancreas: No focal abnormality. Spleen: Chronic subcentimeter low-attenuation lesion in the central spleen. Probable cyst or hemangioma. Stable. Otherwise unremarkable without contrast. Adrenals/Urinary Tract: There is no adrenal mass. There is bilateral renal cortical thinning. Stable left renal cysts and 1 cm nonobstructive caliceal stone in the superior pole. 2 mm stone or renovascular calcification upper pole right kidney. Perinephric stranding is also similar. There is no ureteral stone or hydronephrosis. The right side of the bladder obscured by interval new right hip replacement. The visualized bladder normal in thickness. Stomach/Bowel: Small hiatal hernia. Contracted stomach with normal caliber unopacified small bowel. Normal appendix which is well visible. Mild-to-moderate stool retention in the colon including the rectum but no rectal wall thickening or inflammation. Scattered sigmoid diverticula without diverticulitis. Vascular/Lymphatic: Aortoiliac heavy calcific plaques are again noted with aorto bi-iliac stent graft. A large excluded aneurysm sac is again noted and is larger than previously. On the 2 prior studies, the aneurysmal sac measured 6.9 x 7.0 cm, today measuring 7.6 x 7.6 cm. 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 There are endovascular coils in the region of the right internal iliac artery which were noted previously. Reproductive: Old prostatectomy. Other: There are tiny umbilical and inguinal fat hernias. There is no incarcerated hernia. There is no free hemorrhage, free fluid or free air, or inflammatory stranding around the AAA. Multiple pelvic phleboliths. Musculoskeletal: Chronic L5-S1 fusion hardware is  again noted with chronic L5 spondylolysis and grade 2 L5-S1 spondylolisthesis, unchanged. There is osteopenia degenerative change of the lumbar spine with discogenic mild grade 1 retrolisthesis unchanged at L1-2 and L2-3, slight dextroscoliosis. Interval right hip replacement. No acute hardware complications. No operative site hematoma. Ankylosis left SI joint. IMPRESSION: 1. COPD and mild chronic bronchitis with prior right lower lobectomy. No focal pneumonia is seen. Chronic prominence of the pulmonary trunk. 2. Aortic and coronary artery atherosclerosis, prior CABG, pacemaker, and abdominal aortobi-iliac stent grafting. 3. 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 Annual CTA or MRA follow-up recommended improved 4. 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 CTA would be the preferred imaging workup, MRA if the patient cannot have IV contrast. 5. Constipation and diverticulosis. 6. Nonobstructive nephrolithiasis. 7. Degenerative and postsurgical changes of the spine and osteopenia. No acute spinal compression fracture. Electronically Signed   By: Telford Nab M.D.   On: 08/14/2022 05:23   NM Pulmonary Perfusion  Result Date: 08/14/2022 CLINICAL DATA:  This study identified as missing a report at 4:54 am on 08/14/2022. 81 year old male with shortness of breath for 2 months. EXAM: NUCLEAR MEDICINE PERFUSION LUNG SCAN  TECHNIQUE: Perfusion images were obtained in multiple projections after intravenous injection of radiopharmaceutical. Ventilation scans intentionally deferred if perfusion scan and chest x-ray adequate for interpretation during COVID 19 epidemic. RADIOPHARMACEUTICALS:  4.4 mCi Tc-5m MAA IV COMPARISON:  PA and lateral chest radiographs 08/13/2022. Subsequent CT Chest, Abdomen, and Pelvis 08/14/2022 at 0411 hours. FINDINGS: Fairly homogeneous lung perfusion radiotracer activity when accounting for chronic right lung pleural scarring  and costophrenic angle blunting demonstrated radiographically, and centrilobular and paraseptal emphysema demonstrated by CT this morning. No suspicious perfusion defect. IMPRESSION: Chronic lung disease. No scintigraphic evidence of pulmonary embolus. Electronically Signed   By: Genevie Ann M.D.   On: 08/14/2022 04:58   DG Chest 2 View  Result Date: 08/14/2022 CLINICAL DATA:  This study identified as missing a report at 4:54 am on 08/14/2022. 81 year old male with shortness of breath for 2 months. EXAM: CHEST - 2 VIEW COMPARISON:  Portable chest 06/10/2022 and earlier. FINDINGS: Chronic CABG and left chest pacemaker. Chronic bilateral shoulder arthroplasty. Lung volumes and mediastinal contours are stable since 2018. No significant cardiomegaly. Visualized tracheal air column is within normal limits. No pneumothorax or pulmonary edema. Chronic blunting of the right costophrenic angle is not significantly changed since 2018 and appears related to chronic pleural thickening by chest CT in 2021. No acute pulmonary opacity. Stable visualized osseous structures.  Negative visible bowel gas. IMPRESSION: 1. No acute cardiopulmonary abnormality. 2. Chronic right lung pleural thickening, left chest pacemaker, prior CABG. Electronically Signed   By: Genevie Ann M.D.   On: 08/14/2022 04:56   CT Head Wo Contrast  Result Date: 08/14/2022 CLINICAL DATA:  81 year old male with altered mental status. Increasing back pain with movement. EXAM: CT HEAD WITHOUT CONTRAST TECHNIQUE: Contiguous axial images were obtained from the base of the skull through the vertex without intravenous contrast. RADIATION DOSE REDUCTION: This exam was performed according to the departmental dose-optimization program which includes automated exposure control, adjustment of the mA and/or kV according to patient size and/or use of iterative reconstruction technique. COMPARISON:  Brain MRI 01/14/2010.  Head CT 06/10/2022. 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 Small chronic bilateral cerebellar infarcts. Stable cerebral volume. Patchy encephalomalacia and dystrophic calcification in the right parietal lobe is stable. Small area of chronic encephalomalacia in the right inferior frontal gyrus is stable. No midline shift, ventriculomegaly, mass effect, evidence of mass lesion, intracranial hemorrhage or evidence of cortically based acute infarction. Vascular: Calcified atherosclerosis at the skull base. No suspicious intracranial vascular hyperdensity. Skull: No acute osseous abnormality identified. Sinuses/Orbits: Mild sinus opacification in the left frontal, frontoethmoidal recess and sphenoid sinus have not significantly changed from last month. And overall paranasal sinuses and mastoids are well aerated. Other: No acute orbit or scalp soft tissue finding. IMPRESSION: 1. No acute intracranial abnormality identified. 2. Advanced chronic ischemic disease appears stable by CT since last month. Electronically Signed   By: Genevie Ann M.D.   On: 08/14/2022 04:54   DG Chest Portable 1 View  Result Date: 08/14/2022 CLINICAL DATA:  Shortness of breath, low back pain EXAM: PORTABLE CHEST 1 VIEW COMPARISON:  08/13/2022 FINDINGS: Mild patchy right lower lobe opacity, new, suspicious for pneumonia. Trace right pleural effusion versus chronic pleural thickening. Left lung is clear. No pneumothorax. Mild cardiomegaly. Postsurgical changes related to prior CABG. Left subclavian pacemaker. Median sternotomy. IMPRESSION: Mild patchy right lower lobe opacity, new, suspicious for pneumonia. Electronically Signed   By: Henderson Newcomer.D.  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  Chest x-ray showed possible mild opacity infiltration in right lower lobe, but CT scan of  chest did not show focal infiltration, does not seem to have pneumonia. Patient has mild wheezing on auscultation, indicating possible undiagnosed COPD with acute exacerbation.  No oxygen desaturation.  Procalcitonin elevated at 0.39.  Patient does not meet criteria for sepsis.  - will admit to tele bed as inpatient -Bronchodilators -pt was given 80 mg of Solu-Medrol in ED --> will change to oral prednisone 40 mg daily -Z pak  -Mucinex for cough  -Incentive spirometry -Urine S. Pneumococcal and legionella antigen -sputum culture -Nasal cannula oxygen as needed to maintain O2 saturation 93% or greater if pt desat  AAA (abdominal aortic aneurysm) without rupture [facility]) S/p of AAA repair 2018 by Dr. Lucky Cowboy. Now the size has enlarged from 6.9 to 7.6 cm -consulted Dr. Lorenso Courier of [facility]  Addendum: Per Dr. Lorenso Courier, patient likely need surgical intervention. Pt will need CTA as soon as possible.  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 Candiss Norse.  He recommended to treat patient with IV fluid for few hours before doing CTA.  Patient is started on NS at 75/h. Will do CTA at three hours (14:00) after staring IVF. Pt will need to continue IVF until tomorrow AM after CTA is done.  -CTA of abd/pelvis is order (will be done at 14:00) -continue NS at 75cc/h -will hold [place]    Thoracic aortic aneurysm without rupture  ([place]) -f/u with [facility]  Aneurysm of right common iliac artery ([place]) -f/u with [facility]  CAD S/P CABG x 3 Hx of CAD s/p of CABG and myocardial injury due to COPD exacerbation: Troponin level 31, 30.  Denies chest pain. -Continue home Lipitor, Zetia -hold ASA -consulted Dr. Saralyn Pilar of cardiology per Dr. Dolores Hoose recommendation  Myocardial injury -see above  Essential hypertension -IV hydralazine as needed -Metoprolol - hold zozaar since pt will receive contrast    Chronic diastolic CHF (congestive heart failure) ([place]) 2D echo on 06/14/2022 showed EF of 60 to 65% with grade 1 diastolic dysfunction.  Patient does not have leg edema.  No pulm edema chest x-ray.  CHF seem to be compensated. -Check BNP  Chronic kidney disease, stage IV (severe) (HCC) Slightly worsening than baseline. -Monitor renal function closely by BMP -pt received 1L of NS in ED  HLD (hyperlipidemia) -Continue to Lipitor and zetia  Iron deficiency anemia Hemoglobin 8.6 (9.7 on 06/15/2022, and 8.6 on 06/13/2022), no active bleeding. -Follow-up with CBC -Continue iron supplement  TIA (transient ischemic attack) - Continue Lipitor -Hold ASA and Plavix with pending vascular surgeon's evaluation of his AAA.  Mild cognitive impairment -Continue donepezil  Low back pain This seems to be a chronic issue, has worsened recently. 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 There is osteopenia degenerative change of the lumbar spine with discogenic mild grade 1 retrolisthesis unchanged at L1-2 and L2-3, slight dextroscoliosis. 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  No acute issues.  Anxiety -start prn Xanax 0.25 mg bid  Obesity (BMI 30-39.9)  BMI= 30.68  and BW= 99.8 -Diet and exercise.   -Encourage to  lose weight.           DVT ppx: SCD  Code Status: DNR (I discussed with patient in the presence of her wife, and explained the meaning of CODE STATUS. Patient wants to be DNR)  Family Communication:  Yes, patient's wife  at bed side.     Disposition Plan:  Anticipate discharge back to previous environment  Consults called:  Dr. Lorenso Courier of [facility], Dr. Candiss Norse of renal and Dr. Saralyn Pilar of cardiology  Admission status and Level of care: Telemetry Cardiac:     as inpt      Dispo: The patient is from: Home              Anticipated d/c is to: Home              Anticipated d/c date is: 2 days              Patient currently is not medically stable to d/c.    Severity of Illness:  The appropriate patient status for this patient is INPATIENT. Inpatient status is judged to be reasonable and necessary in order to provide the required intensity of service to ensure the patient's safety. The patient's presenting symptoms, physical exam findings, and initial radiographic and laboratory data in the context of their chronic comorbidities is felt to place them at high risk for further clinical deterioration. Furthermore, it is not anticipated that the patient will be medically stable for discharge from the hospital within 2 midnights of admission.   * I certify that at the point of admission it is my clinical judgment that the patient will require inpatient hospital care spanning beyond 2 midnights from the point of admission due to high intensity of service, high risk for further deterioration and high frequency of surveillance required.*       Date of Service 08/14/2022    Ivor Costa [facility]   If 7PM-7AM, please contact night-coverage [email or web address] 08/14/2022, 8:02 PM

## 2022-08-14 NOTE — Plan of Care (Signed)
  Problem: Education: Goal: Knowledge of disease or condition will improve Outcome: Progressing Goal: Knowledge of the prescribed therapeutic regimen will improve Outcome: Progressing Goal: Individualized Educational Video(s) Outcome: Progressing   Problem: Activity: Goal: Ability to tolerate increased activity will improve Outcome: Progressing Goal: Will verbalize the importance of balancing activity with adequate rest periods Outcome: Progressing   Problem: Respiratory: Goal: Ability to maintain a clear airway will improve Outcome: Progressing Goal: Levels of oxygenation will improve Outcome: Progressing Goal: Ability to maintain adequate ventilation will improve Outcome: Progressing   Problem: Activity: Goal: Ability to tolerate increased activity will improve Outcome: Progressing   Problem: Clinical Measurements: Goal: Ability to maintain a body temperature in the normal range will improve Outcome: Progressing   Problem: Respiratory: Goal: Ability to maintain adequate ventilation will improve Outcome: Progressing Goal: Ability to maintain a clear airway will improve Outcome: Progressing   Problem: Education: Goal: Knowledge of General Education information will improve Description: Including pain rating scale, medication(s)/side effects and non-pharmacologic comfort measures Outcome: Progressing   Problem: Health Behavior/Discharge Planning: Goal: Ability to manage health-related needs will improve Outcome: Progressing   Problem: Clinical Measurements: Goal: Ability to maintain clinical measurements within normal limits will improve Outcome: Progressing Goal: Will remain free from infection Outcome: Progressing Goal: Diagnostic test results will improve Outcome: Progressing Goal: Respiratory complications will improve Outcome: Progressing Goal: Cardiovascular complication will be avoided Outcome: Progressing   Problem: Activity: Goal: Risk for activity  intolerance will decrease Outcome: Progressing   Problem: Nutrition: Goal: Adequate nutrition will be maintained Outcome: Progressing   Problem: Coping: Goal: Level of anxiety will decrease Outcome: Progressing   Problem: Elimination: Goal: Will not experience complications related to bowel motility Outcome: Progressing Goal: Will not experience complications related to urinary retention Outcome: Progressing   Problem: Pain Managment: Goal: General experience of comfort will improve Outcome: Progressing   Problem: Safety: Goal: Ability to remain free from injury will improve Outcome: Progressing   Problem: Skin Integrity: Goal: Risk for impaired skin integrity will decrease Outcome: Progressing   Problem: Education: Goal: Knowledge of General Education information will improve Description: Including pain rating scale, medication(s)/side effects and non-pharmacologic comfort measures Outcome: Progressing   Problem: Health Behavior/Discharge Planning: Goal: Ability to manage health-related needs will improve Outcome: Progressing   Problem: Clinical Measurements: Goal: Ability to maintain clinical measurements within normal limits will improve Outcome: Progressing Goal: Will remain free from infection Outcome: Progressing Goal: Diagnostic test results will improve Outcome: Progressing Goal: Respiratory complications will improve Outcome: Progressing Goal: Cardiovascular complication will be avoided Outcome: Progressing   Problem: Activity: Goal: Risk for activity intolerance will decrease Outcome: Progressing   Problem: Nutrition: Goal: Adequate nutrition will be maintained Outcome: Progressing   Problem: Coping: Goal: Level of anxiety will decrease Outcome: Progressing   Problem: Elimination: Goal: Will not experience complications related to bowel motility Outcome: Progressing Goal: Will not experience complications related to urinary  retention Outcome: Progressing   Problem: Pain Managment: Goal: General experience of comfort will improve Outcome: Progressing   Problem: Safety: Goal: Ability to remain free from injury will improve Outcome: Progressing   Problem: Skin Integrity: Goal: Risk for impaired skin integrity will decrease Outcome: Progressing

## 2022-08-14 NOTE — Progress Notes (Signed)
[place], [place] 08/14/22  Subjective:   Hospital day # 0  Patient known to our practice from outpatient follow-up of CKD.  He presented to the emergency room at this time for weakness in the left leg and pain in the back.  During work-up he was found to have an enlarging aortic aneurysm.  A CT angiogram is not required for proper evaluation of aorta for possible endograft repair.  Nephrology consult has now been requested because of potential for IV contrast causing worsening of renal. 10/06 0701 - 10/07 0700 In: 1000 [IV Piggyback:1000] Out: -  Lab Results  Component Value Date   CREATININE 2.69 (H) 08/14/2022   CREATININE 2.39 (H) 06/15/2022   CREATININE 2.30 (H) 06/14/2022     Objective:  Vital signs in last 24 hours:  Temp:  [98.1 F (36.7 C)-98.3 F (36.8 C)] 98.1 F (36.7 C) (10/07 1005) Pulse Rate:  [63-83] 81 (10/07 1030) Resp:  [12-21] 21 (10/07 1030) BP: (117-172)/(55-94) 117/64 (10/07 1030) SpO2:  [92 %-100 %] 100 % (10/07 1030) Weight:  [99.8 kg] 99.8 kg (10/07 0232)  Weight change:  Filed Weights   08/14/22 0232  Weight: 99.8 kg    Intake/Output:    Intake/Output Summary (Last 24 hours) at 08/14/2022 1129 Last data filed at 08/14/2022 0526 Gross per 24 hour  Intake 1000 ml  Output --  Net 1000 ml     Physical Exam: General: Frail, elderly gentleman, laying in the bed  HEENT Anicteric, moist oral mucous membranes  Pulm/lungs Normal breathing effort, clear to auscultation  CVS/Heart No rub or gallop  Abdomen:  Soft, nontender  Extremities: No peripheral edema  Neurologic: Alert, oriented, pain with movement of right leg  Skin: No acute rashes          Basic Metabolic Panel:  Recent Labs  Lab 08/14/22 0247  NA 137  K 4.6  CL 103  CO2 24  GLUCOSE 104*  BUN 31*  CREATININE 2.69*  CALCIUM 9.1     CBC: Recent Labs  Lab 08/14/22 0247  WBC 3.8*  HGB 8.6*  HCT 28.6*  MCV 92.9  PLT 136*     No  results found for: "HEPBSAG", "HEPBSAB", "HEPBIGM"    Microbiology:  Recent Results (from the past 240 hour(s))  Culture, blood (routine x 2)     Status: None (Preliminary result)   Collection Time: 08/14/22  3:57 AM   Specimen: BLOOD  Result Value Ref Range Status   Specimen Description BLOOD BLOOD RIGHT ARM  Final   Special Requests   Final    BOTTLES DRAWN AEROBIC AND ANAEROBIC Blood Culture adequate volume   Culture   Final    NO GROWTH <12 HOURS Performed at [facility], [address] [place], [place] [address]    Report Status PENDING  Incomplete  Culture, blood (routine x 2)     Status: None (Preliminary result)   Collection Time: 08/14/22  3:57 AM   Specimen: BLOOD  Result Value Ref Range Status   Specimen Description BLOOD BLOOD LEFT ARM  Final   Special Requests   Final    BOTTLES DRAWN AEROBIC AND ANAEROBIC Blood Culture adequate volume   Culture   Final    NO GROWTH <12 HOURS Performed at [facility], [address] [place], [place] [address]    Report Status PENDING  Incomplete  Resp Panel by RT-PCR (Flu A&B, Covid) Anterior Nasal Swab     Status: None  Collection Time: 08/14/22  3:57 AM   Specimen: Anterior Nasal Swab  Result Value Ref Range Status   SARS Coronavirus 2 by RT PCR NEGATIVE NEGATIVE Final    Comment: (NOTE) SARS-CoV-2 target nucleic acids are NOT DETECTED.  The SARS-CoV-2 RNA is generally detectable in upper respiratory specimens during the acute phase of infection. The lowest concentration of SARS-CoV-2 viral copies this assay can detect is 138 copies/mL. A negative result does not preclude SARS-Cov-2 infection and should not be used as the sole basis for treatment or other patient management decisions. A negative result may occur with  improper specimen collection/handling, submission of specimen other than nasopharyngeal swab, presence of viral mutation(s) within the areas targeted by this assay, and inadequate  number of viral copies(<138 copies/mL). A negative result must be combined with clinical observations, patient history, and epidemiological information. The expected result is Negative.  Fact Sheet for Patients:  EntrepreneurPulse.com.au  Fact Sheet for Healthcare Providers:  IncredibleEmployment.be  This test is no t yet approved or cleared by the [place] FDA and  has been authorized for detection and/or diagnosis of SARS-CoV-2 by FDA under an Emergency Use Authorization (EUA). This EUA will remain  in effect (meaning this test can be used) for the duration of the COVID-19 declaration under Section 564(b)(1) of the Act, 21 U.S.C.section 360bbb-3(b)(1), unless the authorization is terminated  or revoked sooner.       Influenza A by PCR NEGATIVE NEGATIVE Final   Influenza B by PCR NEGATIVE NEGATIVE Final    Comment: (NOTE) The Xpert Xpress SARS-CoV-2/FLU/RSV plus assay is intended as an aid in the diagnosis of influenza from Nasopharyngeal swab specimens and should not be used as a sole basis for treatment. Nasal washings and aspirates are unacceptable for Xpert Xpress SARS-CoV-2/FLU/RSV testing.  Fact Sheet for Patients: EntrepreneurPulse.com.au  Fact Sheet for Healthcare Providers: IncredibleEmployment.be  This test is not yet approved or cleared by the [place] FDA and has been authorized for detection and/or diagnosis of SARS-CoV-2 by FDA under an Emergency Use Authorization (EUA). This EUA will remain in effect (meaning this test can be used) for the duration of the COVID-19 declaration under Section 564(b)(1) of the Act, 21 U.S.C. section 360bbb-3(b)(1), unless the authorization is terminated or revoked.  Performed at [facility], [address] [place], [place] [address]     Coagulation Studies: No results for input(s): "LABPROT", "INR" in the last 72  hours.  Urinalysis: Recent Labs    08/14/22 0654  COLORURINE YELLOW*  LABSPEC 1.017  PHURINE 6.0  GLUCOSEU NEGATIVE  HGBUR NEGATIVE  BILIRUBINUR NEGATIVE  KETONESUR NEGATIVE  PROTEINUR 30*  NITRITE NEGATIVE  LEUKOCYTESUR NEGATIVE      Imaging: CT Angio Abd/Pel w/ and/or w/o  Result Date: 08/14/2022 Tora Kindred, RT     08/14/2022 11:17 AM Spoke with Dr Blaine Hamper at 11:12  over the phone and he consulted with Dr Candiss Norse for this patient with low GFR of 23. Per Dr Keturah Barre recommendation,  Patient will be hydrated for 3 hours priors to getting CTA  with IVF NS and then will be hydrated after scan. After Dr Blaine Hamper consulted with vascular doctor, this patient needs this exam with contrast. CT will proceed with exam per Dr Edgar Frisk order and will document with exam.   CT CHEST ABDOMEN PELVIS WO CONTRAST  Result Date: 08/14/2022 CLINICAL DATA:  Pneumonia, complications suspected. Low back pain onset 2 days ago worsening with movement. Shortness of breath. EXAM: CT CHEST, ABDOMEN AND PELVIS  WITHOUT CONTRAST TECHNIQUE: Multidetector CT imaging of the chest, abdomen and pelvis was performed following the standard protocol without IV contrast. RADIATION DOSE REDUCTION: This exam was performed according to the departmental dose-optimization program which includes automated exposure control, adjustment of the mA and/or kV according to patient size and/or use of iterative reconstruction technique. COMPARISON:  PET-CT skull base to pelvis 09/09/2021, CT abdomen pelvis without contrast 03/31/2021. Most recent chest x-ray was portable chest today, portable chest 08/13/2022. FINDINGS: CT CHEST FINDINGS Cardiovascular: There is mild cardiomegaly. Metal artifact from left chest dual lead pacing system and dual lead wires in the heart. There are CABG changes. Aortic tortuosity and moderate patchy arthrosclerosis with aortic root ectasia up to 3.8 cm and ascending aorta with mild dilatation to 4.2 cm. The remainder is within  normal caliber limits with scattered calcific plaque in the great vessels. There is a prominent pulmonary trunk 3.4 cm indicating arterial hypertension, unchanged. There are normal caliber pulmonary veins. Mediastinum/Nodes: No intrathoracic or axillary adenopathy. Thyroid gland obscured by metallic artifact from bilateral shoulder replacements and left chest pacemaker. Small amount of retained secretions at the right posterolateral tracheal wall, otherwise unremarkable trachea and main bronchi. Lungs/Pleura: Chronic changes in the bases and moderate emphysematous disease with both paraseptal and centrilobular changes. Right lower lobectomy with volume loss and mild asymmetric elevation of the right diaphragm. There is mild bronchial thickening. No pneumonic infiltrate or nodule is seen. No pleural effusion, thickening or pneumothorax. Musculoskeletal: There is osteopenia, degenerative disc disease and spondylosis of the thoracic spine. As above there are bilateral shoulder replacements. CT ABDOMEN PELVIS FINDINGS Hepatobiliary: The liver is unremarkable without contrast. The gallbladder and bile ducts are unremarkable. Pancreas: No focal abnormality. Spleen: Chronic subcentimeter low-attenuation lesion in the central spleen. Probable cyst or hemangioma. Stable. Otherwise unremarkable without contrast. Adrenals/Urinary Tract: There is no adrenal mass. There is bilateral renal cortical thinning. Stable left renal cysts and 1 cm nonobstructive caliceal stone in the superior pole. 2 mm stone or renovascular calcification upper pole right kidney. Perinephric stranding is also similar. There is no ureteral stone or hydronephrosis. The right side of the bladder obscured by interval new right hip replacement. The visualized bladder normal in thickness. Stomach/Bowel: Small hiatal hernia. Contracted stomach with normal caliber unopacified small bowel. Normal appendix which is well visible. Mild-to-moderate stool retention  in the colon including the rectum but no rectal wall thickening or inflammation. Scattered sigmoid diverticula without diverticulitis. Vascular/Lymphatic: Aortoiliac heavy calcific plaques are again noted with aorto bi-iliac stent graft. A large excluded aneurysm sac is again noted and is larger than previously. On the 2 prior studies, the aneurysmal sac measured 6.9 x 7.0 cm, today measuring 7.6 x 7.6 cm. 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 There are endovascular coils in the region of the right internal iliac artery which were noted previously. Reproductive: Old prostatectomy. Other: There are tiny umbilical and inguinal fat hernias. There is no incarcerated hernia. There is no free hemorrhage, free fluid or free air, or inflammatory stranding around the AAA. Multiple pelvic phleboliths. Musculoskeletal: Chronic L5-S1 fusion hardware is again noted with chronic L5 spondylolysis and grade 2 L5-S1 spondylolisthesis, unchanged. There is osteopenia degenerative change of the lumbar spine with discogenic mild grade 1 retrolisthesis unchanged at L1-2 and L2-3, slight dextroscoliosis. Interval right hip replacement. No acute hardware complications. No operative site hematoma. Ankylosis left SI joint. IMPRESSION: 1. COPD and mild chronic bronchitis with prior right  lower lobectomy. No focal pneumonia is seen. Chronic prominence of the pulmonary trunk. 2. Aortic and coronary artery atherosclerosis, prior CABG, pacemaker, and abdominal aortobi-iliac stent grafting. 3. 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 Annual CTA or MRA follow-up recommended improved 4. 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 CTA would be the preferred imaging workup, MRA if the patient cannot have IV contrast. 5. Constipation and diverticulosis. 6. Nonobstructive nephrolithiasis. 7. Degenerative and postsurgical  changes of the spine and osteopenia. No acute spinal compression fracture. Electronically Signed   By: Telford Nab M.D.   On: 08/14/2022 05:23   NM Pulmonary Perfusion  Result Date: 08/14/2022 CLINICAL DATA:  This study identified as missing a report at 4:54 am on 08/14/2022. 81 year old male with shortness of breath for 2 months. EXAM: NUCLEAR MEDICINE PERFUSION LUNG SCAN TECHNIQUE: Perfusion images were obtained in multiple projections after intravenous injection of radiopharmaceutical. Ventilation scans intentionally deferred if perfusion scan and chest x-ray adequate for interpretation during COVID 19 epidemic. RADIOPHARMACEUTICALS:  4.4 mCi Tc-30m MAA IV COMPARISON:  PA and lateral chest radiographs 08/13/2022. Subsequent CT Chest, Abdomen, and Pelvis 08/14/2022 at 0411 hours. FINDINGS: Fairly homogeneous lung perfusion radiotracer activity when accounting for chronic right lung pleural scarring and costophrenic angle blunting demonstrated radiographically, and centrilobular and paraseptal emphysema demonstrated by CT this morning. No suspicious perfusion defect. IMPRESSION: Chronic lung disease. No scintigraphic evidence of pulmonary embolus. Electronically Signed   By: Genevie Ann M.D.   On: 08/14/2022 04:58   DG Chest 2 View  Result Date: 08/14/2022 CLINICAL DATA:  This study identified as missing a report at 4:54 am on 08/14/2022. 81 year old male with shortness of breath for 2 months. EXAM: CHEST - 2 VIEW COMPARISON:  Portable chest 06/10/2022 and earlier. FINDINGS: Chronic CABG and left chest pacemaker. Chronic bilateral shoulder arthroplasty. Lung volumes and mediastinal contours are stable since 2018. No significant cardiomegaly. Visualized tracheal air column is within normal limits. No pneumothorax or pulmonary edema. Chronic blunting of the right costophrenic angle is not significantly changed since 2018 and appears related to chronic pleural thickening by chest CT in 2021. No acute pulmonary  opacity. Stable visualized osseous structures.  Negative visible bowel gas. IMPRESSION: 1. No acute cardiopulmonary abnormality. 2. Chronic right lung pleural thickening, left chest pacemaker, prior CABG. Electronically Signed   By: Genevie Ann M.D.   On: 08/14/2022 04:56   CT Head Wo Contrast  Result Date: 08/14/2022 CLINICAL DATA:  81 year old male with altered mental status. Increasing back pain with movement. EXAM: CT HEAD WITHOUT CONTRAST TECHNIQUE: Contiguous axial images were obtained from the base of the skull through the vertex without intravenous contrast. RADIATION DOSE REDUCTION: This exam was performed according to the departmental dose-optimization program which includes automated exposure control, adjustment of the mA and/or kV according to patient size and/or use of iterative reconstruction technique. COMPARISON:  Brain MRI 01/14/2010.  Head CT 06/10/2022. 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 Small chronic bilateral cerebellar infarcts. Stable cerebral volume. Patchy encephalomalacia and dystrophic calcification in the right parietal lobe is stable. Small area of chronic encephalomalacia in the right inferior frontal gyrus is stable. No midline shift, ventriculomegaly, mass effect, evidence of mass lesion, intracranial hemorrhage or evidence of cortically based acute infarction. Vascular: Calcified atherosclerosis at the skull base. No suspicious intracranial vascular hyperdensity. Skull: No acute osseous abnormality identified. Sinuses/Orbits: Mild sinus opacification in  the left frontal, frontoethmoidal recess and sphenoid sinus have not significantly changed from last month. And overall paranasal sinuses and mastoids are well aerated. Other: No acute orbit or scalp soft tissue finding. IMPRESSION: 1. No acute intracranial abnormality identified. 2. Advanced chronic ischemic disease appears stable by CT since last  month. Electronically Signed   By: Genevie Ann M.D.   On: 08/14/2022 04:54   DG Chest Portable 1 View  Result Date: 08/14/2022 CLINICAL DATA:  Shortness of breath, low back pain EXAM: PORTABLE CHEST 1 VIEW COMPARISON:  08/13/2022 FINDINGS: Mild patchy right lower lobe opacity, new, suspicious for pneumonia. Trace right pleural effusion versus chronic pleural thickening. Left lung is clear. No pneumothorax. Mild cardiomegaly. Postsurgical changes related to prior CABG. Left subclavian pacemaker. Median sternotomy. IMPRESSION: Mild patchy right lower lobe opacity, new, suspicious for pneumonia. Electronically Signed   By: Julian Hy M.D.   On: 08/14/2022 03:17     Medications:    sodium chloride 75 mL/hr at 08/14/22 1038    [START ON 08/15/2022] azithromycin  250 mg Oral Daily   ipratropium-albuterol  3 mL Nebulization Q6H   lidocaine  1 patch Transdermal Q24H   [START ON 08/15/2022] predniSONE  40 mg Oral Q breakfast   acetaminophen, albuterol, ALPRAZolam, dextromethorphan-guaiFENesin, hydrALAZINE, methocarbamol, ondansetron (ZOFRAN) IV, oxyCODONE-acetaminophen  Assessment/ Plan:  81 y.o. 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  Kidney function is at baseline.  Urinalysis shows small proteinuria.  Concern about IV contrast exposure causing worsening of kidney function.  Overall this is a difficult situation.  Patient does require proper assessment of aorta (which necessitates IV contrast exposure) prior to planning endovascular repair. Discussed this with patient.  We also discussed possibility of  worsening renal function. 2D echo from 06/14/2022 shows LVEF 60 to 16%, grade 1 diastolic dysfunction Plan: We will start IV hydration with normal saline few hours prior to IV contrast exposure and will continue IV fluids until at least tomorrow morning.  Hopefully this will minimize injury to the kidney from IV contrast.  Timing of endovascular repair will be determined by vascular surgery.  Case discussed with patient as well as primary team.     LOS: 0 Amerah Puleo 10/7/202311:29 [place], [place]  Note: This note was prepared with Dragon dictation. Any transcription errors are unintentional

## 2022-08-14 NOTE — ED Notes (Signed)
Pt transferred to a hospital bed for pt comfort. Pt continues to have muscle spasms, new medication ordered and given.

## 2022-08-14 NOTE — Assessment & Plan Note (Signed)
-  see above 

## 2022-08-14 NOTE — Assessment & Plan Note (Addendum)
-  Continue donepezil

## 2022-08-14 NOTE — Assessment & Plan Note (Addendum)
-  Continue to Lipitor and zetia

## 2022-08-14 NOTE — Consult Note (Signed)
[facility] Vascular Consult Note  MRN : [id number]  Rick Mcbride. is a 81 y.o. (1941-01-21) male who presents with chief complaint of  Chief Complaint  Patient presents with   Back Pain  .  History of Present Illness: Patient known to service; EVAR 2018- monitored annually- last in January 2023- at that time aneurysm sac on Duplex 6.3cm-stable. Patient has had chronic back pain, that has worsened over the last few weeks. Particularly after an aggressive PT session last week. He states the back pain became significant with Left sided back and leg pain. The leg pain resolved however, the back pain persisted, worsening over the last few days. Denies abdominal pain, denies nausea, vomiting, denies chest pain or shortness of breath. Denies leg pain- history of Right hip fracture and neuropathy of bilateral feet.  Current Facility-Administered Medications  Medication Dose Route Frequency Provider Last Rate Last Admin   acetaminophen (TYLENOL) tablet 650 mg  650 mg Oral Q6H PRN Ivor Costa, MD       albuterol (PROVENTIL) (2.5 MG/3ML) 0.083% nebulizer solution 2.5 mg  2.5 mg Nebulization Q4H PRN Ivor Costa, MD       ALPRAZolam Duanne Moron) tablet 0.25 mg  0.25 mg Oral BID PRN Ivor Costa, MD       Derrill Memo ON 08/15/2022] azithromycin (ZITHROMAX) tablet 250 mg  250 mg Oral Daily Ivor Costa, MD       dextromethorphan-guaiFENesin ([place] DM) 30-600 MG per 12 hr tablet 1 tablet  1 tablet Oral BID PRN Ivor Costa, MD       hydrALAZINE (APRESOLINE) injection 5 mg  5 mg Intravenous Q2H PRN Ivor Costa, MD       ipratropium-albuterol (DUONEB) 0.5-2.5 (3) MG/3ML nebulizer solution 3 mL  3 mL Nebulization Q6H Ivor Costa, MD       lidocaine (LIDODERM) 5 % 1 patch  1 patch Transdermal Q24H Ivor Costa, MD   1 patch at 08/14/22 0914   methocarbamol (ROBAXIN) tablet 500 mg  500 mg Oral Q8H PRN Ivor Costa, MD       ondansetron [facility]) injection 4 mg  4 mg Intravenous Q8H PRN Ivor Costa, MD        oxyCODONE-acetaminophen (PERCOCET/ROXICET) 5-325 MG per tablet 1 tablet  1 tablet Oral Q6H PRN Ivor Costa, MD       Derrill Memo ON 08/15/2022] predniSONE (DELTASONE) tablet 40 mg  40 mg Oral Q breakfast Ivor Costa, MD       Current Outpatient Medications  Medication Sig Dispense Refill   acetaminophen (TYLENOL) 500 MG tablet Take 500 mg by mouth every 4 (four) hours as needed for moderate pain.     apixaban (ELIQUIS) 2.5 MG TABS tablet Take 1 tablet (2.5 mg total) by mouth 2 (two) times daily. 30 tablet 0   aspirin EC 81 MG tablet Take 81 mg by mouth daily.      atorvastatin (LIPITOR) 20 MG tablet Take 40 mg by mouth at bedtime.     calcitRIOL (ROCALTROL) 0.25 MCG capsule Take 0.25 mcg by mouth daily.     Calcium Carb-Cholecalciferol (CALCIUM 600 + D PO) Take 1 tablet by mouth daily.     cetirizine (ZYRTEC) 10 MG tablet Take 10 mg by mouth at bedtime.     chlorproMAZINE (THORAZINE) 10 MG tablet Take 1 tablet (10 mg total) by mouth 4 (four) times daily for 2 days. 8 tablet 0   clopidogrel (PLAVIX) 75 MG tablet Take 1 tablet (75 mg total) by mouth daily.  Restart Plavix after completion of Eliquis prescription. 30 tablet 0   docusate sodium (COLACE) 100 MG capsule Take 300 mg by mouth at bedtime.     donepezil (ARICEPT) 10 MG tablet Take 10 mg by mouth at bedtime.     ezetimibe (ZETIA) 10 MG tablet Take 10 mg by mouth daily.     feeding supplement (ENSURE ENLIVE / ENSURE PLUS) LIQD Take 237 mLs by mouth 2 (two) times daily between meals. 237 mL 12   Ferrous Sulfate (IRON SLOW RELEASE) 140 (45 Fe) MG TBCR Take 1 tablet by mouth 2 (two) times daily.     loratadine (CLARITIN) 10 MG tablet Take 10 mg by mouth daily.     losartan (COZAAR) 100 MG tablet Take 100 mg by mouth daily.     melatonin 5 MG TABS Take 10 mg by mouth at bedtime.     methocarbamol (ROBAXIN) 500 MG tablet Take 1 tablet (500 mg total) by mouth at bedtime as needed for muscle spasms.     metoCLOPramide (REGLAN) 5 MG tablet Take 1  tablet (5 mg total) by mouth 3 (three) times daily for 2 days. (Patient not taking: Reported on 06/30/2022) 6 tablet 0   metoprolol succinate (TOPROL-XL) 25 MG 24 hr tablet Take 25 mg by mouth daily.     Multiple Vitamin (MULTIVITAMIN WITH MINERALS) TABS tablet Take 1 tablet by mouth daily.      Multiple Vitamins-Minerals (PRESERVISION AREDS 2) CAPS Take 1 tablet by mouth 2 (two) times daily.     ondansetron (ZOFRAN) 4 MG tablet Take 1 tablet (4 mg total) by mouth every 6 (six) hours as needed for nausea. 30 tablet 0   pantoprazole (PROTONIX) 40 MG tablet Take 40 mg by mouth daily.     polyethylene glycol (MIRALAX) 17 g packet Take 17 g by mouth daily as needed for mild constipation. 14 each 0   Probiotic Product (PROBIOTIC PO) Take 1 capsule by mouth daily.     traMADol (ULTRAM) 50 MG tablet Take 1 tablet (50 mg total) by mouth every 6 (six) hours as needed for moderate pain. 30 tablet 0   zolpidem (AMBIEN) 10 MG tablet Take 1 tablet (10 mg total) by mouth at bedtime. 20 tablet 0    Past Medical History:  Diagnosis Date   AAA (abdominal aortic aneurysm) ([place])    a.) s/p EVAR 01/05/2017. b.) native aneurysm sac 6.6 x 6.9 cm by CT on 03/31/2021   Abnormality of tongue    a.) CT head/neck 07/10/2021 --> asymmetric soft tissue at the RIGHT tongue base with a superficial 8 mm lesion.   Anemia    Aneurysm of right common iliac artery (HCC)    a.) measured 2.7 cm by CT on 03/31/2021   Anxiety    Aortic atherosclerosis (HCC)    Atrophic kidney    B12 deficiency    CAD (coronary artery disease)    Cervical radiculopathy    Chronic airway obstruction (HCC)    Chronic kidney disease (CKD), stage III (moderate) (HCC)    Chronic pain syndrome 06/16/2021   Chronic right shoulder pain 11/12/2019   Chronic tension headaches    Chronic, continuous use of opioids 06/16/2021   Coronary artery disease    DDD (degenerative disc disease), lumbar    Degenerative disc disease, lumbar    with lumbar  radiculopathy   Elbow fracture, left    GERD (gastroesophageal reflux disease)    H/O adenomatous polyp of colon    H/O hemorrhoids  HTN (hypertension)    Hyperlipidemia    Meralgia paresthetica    Mild cognitive impairment    Nephrolithiasis    Neuralgia    Numbness of right foot 02/19/2021   Osteoarthritis    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   Presence of permanent cardiac pacemaker    Prostate cancer [facility])    a.) s/p prostatectomy   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    Past Surgical History:  Procedure Laterality Date   CATARACT EXTRACTION Bilateral    COLONOSCOPY     COLONOSCOPY     COLONOSCOPY WITH PROPOFOL N/A 10/20/2015   Procedure: COLONOSCOPY WITH PROPOFOL;  Surgeon: Manya Silvas, MD;  Location: [facility] ENDOSCOPY;  Service: Endoscopy;  Laterality: N/A;   COLONOSCOPY WITH PROPOFOL N/A 11/28/2020   Procedure: COLONOSCOPY WITH PROPOFOL;  Surgeon: Robert Bellow, MD;  Location: [facility] ENDOSCOPY;  Service: Endoscopy;  Laterality: N/A;   CORONARY ARTERY BYPASS GRAFT N/A 05/01/2004   Procedure: 3v CABG (LIMA-LAD, SVG-OM1, SVG-PDA); Location: [facility]; Surgeon: Ander Gaster, MD   EMBOLIZATION Right 12/27/2016   Procedure: Embolization;  Surgeon: Algernon Huxley, MD;  Location: [place] CV LAB;  Service: Cardiovascular;  Laterality: Right;   ENDOVASCULAR REPAIR/STENT GRAFT N/A 01/05/2017   Procedure: Endovascular Repair/Stent Graft;  Surgeon: Algernon Huxley, MD;  Location: [place] CV LAB;  Service:  Cardiovascular;  Laterality: N/A;   HIP ARTHROPLASTY Right 06/10/2022   Procedure: ARTHROPLASTY BIPOLAR HIP (HEMIARTHROPLASTY);  Surgeon: [name], MD;  Location: [facility] ORS;  Service: Orthopedics;  Laterality: Right;   INSERT / REPLACE / REMOVE PACEMAKER     JOINT REPLACEMENT     shoulder and knees   KNEE ARTHROSCOPY     LUNG LOBECTOMY Right 01/31/2013   Procedure: RIGHT PULMONARY LOBECTOMY; Location: [place]; Surgeon: Nestor Lewandowsky, MD   MICROLARYNGOSCOPY Right 08/10/2021   Procedure: MICRODIRECT LARYNGOSCOPY WITH BIOPSY OF TONGUE BASE;  Surgeon: Beverly Gust, MD;  Location: [facility] ORS;  Service: ENT;  Laterality: Right;   PACEMAKER INSERTION  12/2012   Dual chanber pacemaker generator   POLYPECTOMY     POSTERIOR LUMBAR FUSION     Procedure: POSTERIOR LUMBAR INTERBODY FUSION, INTERBODY PROSTHESIS, POSTERIOR LATERAL ARTHRODESIS, POSTERIOR NON-SEGMENTAL INSTRUMENTATION LUMBAR FIVE- SACRAL ONE; Location: [facility]; Surgeon: Newman Pies, MD   PROSTATECTOMY     TONGUE BIOPSY N/A 08/10/2021   Procedure: TONGUE BIOPSY;  Surgeon: Beverly Gust, MD;  Location: [facility] ORS;  Service: ENT;  Laterality: N/A;   TOTAL KNEE ARTHROPLASTY Bilateral    TOTAL SHOULDER ARTHROPLASTY Left 07/10/2015   Procedure: TOTAL SHOULDER ARTHROPLASTY;  Surgeon: [name], MD;  Location: [facility] ORS;  Service: Orthopedics;  Laterality: Left;   TOTAL SHOULDER REPLACEMENT      Social History Social History   Tobacco Use   Smoking status: Former    Packs/day: 1.50    Years: 45.00    Total pack years: 67.50    Types: Cigarettes  Quit date: 04/07/2004    Years since quitting: 18.3   Smokeless tobacco: Never  Vaping Use   Vaping Use: Never used  Substance Use Topics   Alcohol use: No   Drug use: No    Family History Family History  Problem Relation Age of Onset   Heart attack Mother    Heart attack Father    Breast cancer Sister    Asthma Sister     No Known Allergies   REVIEW OF  SYSTEMS (Negative unless checked)  Constitutional: [] Weight loss  [] Fever  [] Chills Cardiac: [] Chest pain   [] Chest pressure   [] Palpitations   [] Shortness of breath when laying flat   [] Shortness of breath at rest   [] Shortness of breath with exertion. Vascular:  [] Pain in legs with walking   [] Pain in legs at rest   [] Pain in legs when laying flat   [] Claudication   [] Pain in feet when walking  [] Pain in feet at rest  [] Pain in feet when laying flat   [] History of DVT   [] Phlebitis   [] Swelling in legs   [] Varicose veins   [] Non-healing ulcers Pulmonary:   [] Uses home oxygen   [] Productive cough   [] Hemoptysis   [] Wheeze  [] COPD   [] Asthma Neurologic:  [] Dizziness  [] Blackouts   [] Seizures   [] History of stroke   [] History of TIA  [] Aphasia   [] Temporary blindness   [] Dysphagia   [] Weakness or numbness in arms   [] Weakness or numbness in legs Musculoskeletal:  [] Arthritis   [] Joint swelling   [] Joint pain   [x] Low back pain Hematologic:  [] Easy bruising  [] Easy bleeding   [] Hypercoagulable state   [] Anemic  [] Hepatitis Gastrointestinal:  [] Blood in stool   [] Vomiting blood  [] Gastroesophageal reflux/heartburn   [] Difficulty swallowing. Genitourinary:  [] Chronic kidney disease   [] Difficult urination  [] Frequent urination  [] Burning with urination   [] Blood in urine Skin:  [] Rashes   [] Ulcers   [] Wounds Psychological:  [] History of anxiety   []  History of major depression.  Physical Examination  Vitals:   08/14/22 0830 08/14/22 0900 08/14/22 0930 08/14/22 1005  BP: 120/62 122/70 134/76   Pulse: 69 69 68   Resp: 15 18 15    Temp:    98.1 F (36.7 C)  TempSrc:    Oral  SpO2: 97% 98% 98%   Weight:      Height:       Body mass index is 30.68 kg/m. Gen:  WD/WN, NAD Head: [place]/AT, No temporalis wasting. Prominent temp pulse not noted. Ear/Nose/Throat: Hearing grossly intact, nares w/o erythema or drainage, oropharynx w/o Erythema/Exudate Eyes: Sclera non-icteric, conjunctiva clear Neck:  Trachea midline.  No JVD.  Pulmonary:  Good air movement, respirations not labored, equal bilaterally.  Cardiac: RRR, normal S1, S2. Vascular:  Vessel Right Left  Radial Palpable Palpable  Ulnar    Brachial    Carotid Palpable, without bruit Palpable, without bruit  Aorta Not palpable N/A  Femoral Palpable Palpable  Popliteal    PT    DP Palpable Palpable   Gastrointestinal: soft, non-tender/non-distended. No guarding/reflex. Aorta- non palpable  Musculoskeletal: M/S 5/5 throughout.  Extremities without ischemic changes.  No deformity or atrophy. No edema. Neurologic: Sensation grossly intact in extremities.  Symmetrical.  Speech is fluent. Motor exam as listed above. Psychiatric: Judgment intact, Mood & affect appropriate for pt's clinical situation. Dermatologic: No rashes or ulcers noted.  No cellulitis or open wounds. Lymph : No Cervical, Axillary, or Inguinal lymphadenopathy.  CBC Lab Results  Component Value Date   WBC 3.8 (L) 08/14/2022   HGB 8.6 (L) 08/14/2022   HCT 28.6 (L) 08/14/2022   MCV 92.9 08/14/2022   PLT 136 (L) 08/14/2022    BMET    Component Value Date/Time   NA 137 08/14/2022 0247   NA 142 10/25/2014 1101   K 4.6 08/14/2022 0247   K 4.1 10/25/2014 1101   CL 103 08/14/2022 0247   CL 108 (H) 10/25/2014 1101   CO2 24 08/14/2022 0247   CO2 28 10/25/2014 1101   GLUCOSE 104 (H) 08/14/2022 0247   GLUCOSE 108 (H) 10/25/2014 1101   BUN 31 (H) 08/14/2022 0247   BUN 20 (H) 10/25/2014 1101   CREATININE 2.69 (H) 08/14/2022 0247   CREATININE 1.64 (H) 10/25/2014 1101   CALCIUM 9.1 08/14/2022 0247   CALCIUM 8.6 10/25/2014 1101   GFRNONAA 23 (L) 08/14/2022 0247   GFRNONAA 44 (L) 10/25/2014 1101   GFRNONAA 40 (L) 03/27/2014 1052   GFRAA 36 (L) 10/12/2018 1341   GFRAA 53 (L) 10/25/2014 1101   GFRAA 47 (L) 03/27/2014 1052   Estimated Creatinine Clearance: 25.9 mL/min (A) (by C-G formula based on SCr of 2.69 mg/dL (H)).  COAG Lab Results  Component  Value Date   INR 1.0 06/10/2022   INR 0.94 03/26/2017   INR 1.01 12/23/2016    Radiology CT CHEST ABDOMEN PELVIS WO CONTRAST  Result Date: 08/14/2022 CLINICAL DATA:  Pneumonia, complications suspected. Low back pain onset 2 days ago worsening with movement. Shortness of breath. EXAM: CT CHEST, ABDOMEN AND PELVIS WITHOUT CONTRAST TECHNIQUE: Multidetector CT imaging of the chest, abdomen and pelvis was performed following the standard protocol without IV contrast. RADIATION DOSE REDUCTION: This exam was performed according to the departmental dose-optimization program which includes automated exposure control, adjustment of the mA and/or kV according to patient size and/or use of iterative reconstruction technique. COMPARISON:  PET-CT skull base to pelvis 09/09/2021, CT abdomen pelvis without contrast 03/31/2021. Most recent chest x-ray was portable chest today, portable chest 08/13/2022. FINDINGS: CT CHEST FINDINGS Cardiovascular: There is mild cardiomegaly. Metal artifact from left chest dual lead pacing system and dual lead wires in the heart. There are CABG changes. Aortic tortuosity and moderate patchy arthrosclerosis with aortic root ectasia up to 3.8 cm and ascending aorta with mild dilatation to 4.2 cm. The remainder is within normal caliber limits with scattered calcific plaque in the great vessels. There is a prominent pulmonary trunk 3.4 cm indicating arterial hypertension, unchanged. There are normal caliber pulmonary veins. Mediastinum/Nodes: No intrathoracic or axillary adenopathy. Thyroid gland obscured by metallic artifact from bilateral shoulder replacements and left chest pacemaker. Small amount of retained secretions at the right posterolateral tracheal wall, otherwise unremarkable trachea and main bronchi. Lungs/Pleura: Chronic changes in the bases and moderate emphysematous disease with both paraseptal and centrilobular changes. Right lower lobectomy with volume loss and mild asymmetric  elevation of the right diaphragm. There is mild bronchial thickening. No pneumonic infiltrate or nodule is seen. No pleural effusion, thickening or pneumothorax. Musculoskeletal: There is osteopenia, degenerative disc disease and spondylosis of the thoracic spine. As above there are bilateral shoulder replacements. CT ABDOMEN PELVIS FINDINGS Hepatobiliary: The liver is unremarkable without contrast. The gallbladder and bile ducts are unremarkable. Pancreas: No focal abnormality. Spleen: Chronic subcentimeter low-attenuation lesion in the central spleen. Probable cyst or hemangioma. Stable. Otherwise unremarkable without contrast. Adrenals/Urinary Tract: There is no adrenal mass. There is bilateral renal cortical thinning. Stable left renal cysts  and 1 cm nonobstructive caliceal stone in the superior pole. 2 mm stone or renovascular calcification upper pole right kidney. Perinephric stranding is also similar. There is no ureteral stone or hydronephrosis. The right side of the bladder obscured by interval new right hip replacement. The visualized bladder normal in thickness. Stomach/Bowel: Small hiatal hernia. Contracted stomach with normal caliber unopacified small bowel. Normal appendix which is well visible. Mild-to-moderate stool retention in the colon including the rectum but no rectal wall thickening or inflammation. Scattered sigmoid diverticula without diverticulitis. Vascular/Lymphatic: Aortoiliac heavy calcific plaques are again noted with aorto bi-iliac stent graft. A large excluded aneurysm sac is again noted and is larger than previously. On the 2 prior studies, the aneurysmal sac measured 6.9 x 7.0 cm, today measuring 7.6 x 7.6 cm. 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 There are endovascular coils in the region of the right internal iliac artery which were noted previously. Reproductive: Old prostatectomy. Other: There are tiny umbilical  and inguinal fat hernias. There is no incarcerated hernia. There is no free hemorrhage, free fluid or free air, or inflammatory stranding around the AAA. Multiple pelvic phleboliths. Musculoskeletal: Chronic L5-S1 fusion hardware is again noted with chronic L5 spondylolysis and grade 2 L5-S1 spondylolisthesis, unchanged. There is osteopenia degenerative change of the lumbar spine with discogenic mild grade 1 retrolisthesis unchanged at L1-2 and L2-3, slight dextroscoliosis. Interval right hip replacement. No acute hardware complications. No operative site hematoma. Ankylosis left SI joint. IMPRESSION: 1. COPD and mild chronic bronchitis with prior right lower lobectomy. No focal pneumonia is seen. Chronic prominence of the pulmonary trunk. 2. Aortic and coronary artery atherosclerosis, prior CABG, pacemaker, and abdominal aortobi-iliac stent grafting. 3. 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 Annual CTA or MRA follow-up recommended improved 4. 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 CTA would be the preferred imaging workup, MRA if the patient cannot have IV contrast. 5. Constipation and diverticulosis. 6. Nonobstructive nephrolithiasis. 7. Degenerative and postsurgical changes of the spine and osteopenia. No acute spinal compression fracture. Electronically Signed   By: Telford Nab M.D.   On: 08/14/2022 05:23   NM Pulmonary Perfusion  Result Date: 08/14/2022 CLINICAL DATA:  This study identified as missing a report at 4:54 am on 08/14/2022. 81 year old male with shortness of breath for 2 months. EXAM: NUCLEAR MEDICINE PERFUSION LUNG SCAN TECHNIQUE: Perfusion images were obtained in multiple projections after intravenous injection of radiopharmaceutical. Ventilation scans intentionally deferred if perfusion scan and chest x-ray adequate for interpretation during COVID 19 epidemic. RADIOPHARMACEUTICALS:  4.4 mCi Tc-41m MAA IV COMPARISON:   PA and lateral chest radiographs 08/13/2022. Subsequent CT Chest, Abdomen, and Pelvis 08/14/2022 at 0411 hours. FINDINGS: Fairly homogeneous lung perfusion radiotracer activity when accounting for chronic right lung pleural scarring and costophrenic angle blunting demonstrated radiographically, and centrilobular and paraseptal emphysema demonstrated by CT this morning. No suspicious perfusion defect. IMPRESSION: Chronic lung disease. No scintigraphic evidence of pulmonary embolus. Electronically Signed   By: Genevie Ann M.D.   On: 08/14/2022 04:58   DG Chest 2 View  Result Date: 08/14/2022 CLINICAL DATA:  This study identified as missing a report at 4:54 am on 08/14/2022. 81 year old male with shortness of breath for 2 months. EXAM: CHEST - 2 VIEW COMPARISON:  Portable chest 06/10/2022 and earlier. FINDINGS: Chronic CABG and left chest pacemaker. Chronic bilateral shoulder arthroplasty. Lung volumes and mediastinal  contours are stable since 2018. No significant cardiomegaly. Visualized tracheal air column is within normal limits. No pneumothorax or pulmonary edema. Chronic blunting of the right costophrenic angle is not significantly changed since 2018 and appears related to chronic pleural thickening by chest CT in 2021. No acute pulmonary opacity. Stable visualized osseous structures.  Negative visible bowel gas. IMPRESSION: 1. No acute cardiopulmonary abnormality. 2. Chronic right lung pleural thickening, left chest pacemaker, prior CABG. Electronically Signed   By: Genevie Ann M.D.   On: 08/14/2022 04:56   CT Head Wo Contrast  Result Date: 08/14/2022 CLINICAL DATA:  81 year old male with altered mental status. Increasing back pain with movement. EXAM: CT HEAD WITHOUT CONTRAST TECHNIQUE: Contiguous axial images were obtained from the base of the skull through the vertex without intravenous contrast. RADIATION DOSE REDUCTION: This exam was performed according to the departmental dose-optimization program which  includes automated exposure control, adjustment of the mA and/or kV according to patient size and/or use of iterative reconstruction technique. COMPARISON:  Brain MRI 01/14/2010.  Head CT 06/10/2022. 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 Small chronic bilateral cerebellar infarcts. Stable cerebral volume. Patchy encephalomalacia and dystrophic calcification in the right parietal lobe is stable. Small area of chronic encephalomalacia in the right inferior frontal gyrus is stable. No midline shift, ventriculomegaly, mass effect, evidence of mass lesion, intracranial hemorrhage or evidence of cortically based acute infarction. Vascular: Calcified atherosclerosis at the skull base. No suspicious intracranial vascular hyperdensity. Skull: No acute osseous abnormality identified. Sinuses/Orbits: Mild sinus opacification in the left frontal, frontoethmoidal recess and sphenoid sinus have not significantly changed from last month. And overall paranasal sinuses and mastoids are well aerated. Other: No acute orbit or scalp soft tissue finding. IMPRESSION: 1. No acute intracranial abnormality identified. 2. Advanced chronic ischemic disease appears stable by CT since last month. Electronically Signed   By: Genevie Ann M.D.   On: 08/14/2022 04:54   DG Chest Portable 1 View  Result Date: 08/14/2022 CLINICAL DATA:  Shortness of breath, low back pain EXAM: PORTABLE CHEST 1 VIEW COMPARISON:  08/13/2022 FINDINGS: Mild patchy right lower lobe opacity, new, suspicious for pneumonia. Trace right pleural effusion versus chronic pleural thickening. Left lung is clear. No pneumothorax. Mild cardiomegaly. Postsurgical changes related to prior CABG. Left subclavian pacemaker. Median sternotomy. IMPRESSION: Mild patchy right lower lobe opacity, new, suspicious for pneumonia. Electronically Signed   By: Julian Hy M.D.   On: 08/14/2022 03:17       Assessment/Plan 1. 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 2. Chronic Back Pain Etiology of back pain likely multifactorial of Chronic Back pain and expanding endoleak. 3. CAD; CKD IV 4. Requires CTA aorta for proper evaluation of aorta/EVAR and etiology of Endoleak  5. Nephrology consult- patient with CKD IV- patient and family do not want Dialysis if worsening renal function- will need to be cautious with additional contrast administration-will await input for timing of CTA and intervention. 6. Cardiology consult- H/O CABG, Pacemaker 7. Will need intervention in the next few days.- Endovascular Procedure will be determined after CTA. 8. Extensive discussion with patient and wife- discussed if patient ruptures, they do not wish to have resuscitation for operative repair.   Evaristo Bury, MD  08/14/2022 10:13 AM    This note was created with Dragon medical transcription system.  Any error is purely unintentional

## 2022-08-14 NOTE — Consult Note (Signed)
[facility] Cardiology  CARDIOLOGY CONSULT NOTE  Patient ID: Maron T Eliezer Mccoy. MRN: [id number] DOB/AGE: September 28, 1941 81 y.o.  Admit date: 08/14/2022 Referring Physician Blaine Hamper Primary Physician [facility] Primary Cardiologist Nehemiah Massed Reason for Consultation preoperative cardiovascular evaluation  HPI: 81 year old gentleman referred for preoperative cardiovascular evaluation prior to abdominal aortic aneurysm repair.  Patient brought to [facility] ED via EMS with chief complaint of lower back pain and generalized weakness.  ECG revealed atrial sensing with ventricular pacing.  Admission labs notable for borderline elevated high-sensitivity troponin (31, 30) in the absence of chest pain, likely elevated due to underlying chronic kidney disease.  Abdominal CT was performed which revealed abdominal aortic aneurysm 7.6 x 7.6 cm which is increased compared to most recent study showing 6.9 x 7.0 cm.  The patient evaluated by vascular surgery who is recommending surgical repair.  The patient has known coronary disease, status post CABG with LIMA to LAD, SVG to OM1 and PDA performed 2005 at [facility].  The patient has dual-chamber pacemaker for second-degree AV block.  Patient has known chronic kidney disease.  He has prior history of CVA/TIA.  Most recent 2D echocardiogram 06/14/2022 revealed LVEF 60-65 %.  Lexiscan Myoview 10/24/2019 did not reveal evidence for scar or ischemia.  The patient currently denies chest pain.  He does report intermittent shortness of breath which is felt to be due to possible bronchitis.  Chest x-ray reveals possible right lower lobe pneumonia.  Review of systems complete and found to be negative unless listed above     Past Medical History:  Diagnosis Date   AAA (abdominal aortic aneurysm) ([place])    a.) s/p EVAR 01/05/2017. b.) native aneurysm sac 6.6 x 6.9 cm by CT on 03/31/2021   Abnormality of tongue    a.) CT head/neck 07/10/2021 --> asymmetric soft tissue at the RIGHT tongue base with a  superficial 8 mm lesion.   Anemia    Aneurysm of right common iliac artery (HCC)    a.) measured 2.7 cm by CT on 03/31/2021   Anxiety    Aortic atherosclerosis (HCC)    Atrophic kidney    B12 deficiency    CAD (coronary artery disease)    Cervical radiculopathy    Chronic airway obstruction (HCC)    Chronic kidney disease (CKD), stage III (moderate) (HCC)    Chronic pain syndrome 06/16/2021   Chronic right shoulder pain 11/12/2019   Chronic tension headaches    Chronic, continuous use of opioids 06/16/2021   Coronary artery disease    DDD (degenerative disc disease), lumbar    Degenerative disc disease, lumbar    with lumbar radiculopathy   Elbow fracture, left    GERD (gastroesophageal reflux disease)    H/O adenomatous polyp of colon    H/O hemorrhoids    HTN (hypertension)    Hyperlipidemia    Meralgia paresthetica    Mild cognitive impairment    Nephrolithiasis    Neuralgia    Numbness of right foot 02/19/2021   Osteoarthritis    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   Presence of permanent cardiac pacemaker    Prostate cancer ([place])    a.) s/p prostatectomy   S/P CABG x 3 05/01/2004   a.) LVEF 40-49%; LIMA-LAD, SVG-OM1, SVG-PDA   Second degree AV block    Sinoatrial node dysfunction (HCC)    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    Past Surgical History:  Procedure Laterality Date   CATARACT EXTRACTION Bilateral    COLONOSCOPY     COLONOSCOPY     COLONOSCOPY WITH PROPOFOL N/A 10/20/2015   Procedure: COLONOSCOPY WITH PROPOFOL;  Surgeon: Manya Silvas, MD;  Location: [facility] ENDOSCOPY;  Service: Endoscopy;  Laterality: N/A;   COLONOSCOPY WITH  PROPOFOL N/A 11/28/2020   Procedure: COLONOSCOPY WITH PROPOFOL;  Surgeon: Robert Bellow, MD;  Location: [facility] ENDOSCOPY;  Service: Endoscopy;  Laterality: N/A;   CORONARY ARTERY BYPASS GRAFT N/A 05/01/2004   Procedure: 3v CABG (LIMA-LAD, SVG-OM1, SVG-PDA); Location: [facility]; Surgeon: Ander Gaster, MD   EMBOLIZATION Right 12/27/2016   Procedure: Embolization;  Surgeon: Algernon Huxley, MD;  Location: [place] CV LAB;  Service: Cardiovascular;  Laterality: Right;   ENDOVASCULAR REPAIR/STENT GRAFT N/A 01/05/2017   Procedure: Endovascular Repair/Stent Graft;  Surgeon: Algernon Huxley, MD;  Location: [place] CV LAB;  Service: Cardiovascular;  Laterality: N/A;   HIP ARTHROPLASTY Right 06/10/2022   Procedure: ARTHROPLASTY BIPOLAR HIP (HEMIARTHROPLASTY);  Surgeon: [name], MD;  Location: [facility] ORS;  Service: Orthopedics;  Laterality: Right;   INSERT / REPLACE / REMOVE PACEMAKER     JOINT REPLACEMENT     shoulder and knees   KNEE ARTHROSCOPY     LUNG LOBECTOMY Right 01/31/2013   Procedure: RIGHT PULMONARY LOBECTOMY; Location: [place]; Surgeon: Nestor Lewandowsky, MD   MICROLARYNGOSCOPY Right 08/10/2021   Procedure: MICRODIRECT LARYNGOSCOPY WITH BIOPSY OF TONGUE BASE;  Surgeon: Beverly Gust, MD;  Location: [facility] ORS;  Service: ENT;  Laterality: Right;   PACEMAKER INSERTION  12/2012   Dual chanber pacemaker generator   POLYPECTOMY     POSTERIOR LUMBAR FUSION     Procedure: POSTERIOR LUMBAR INTERBODY FUSION, INTERBODY PROSTHESIS, POSTERIOR LATERAL ARTHRODESIS, POSTERIOR NON-SEGMENTAL INSTRUMENTATION LUMBAR FIVE- SACRAL ONE; Location: [facility]; Surgeon: Newman Pies, MD   PROSTATECTOMY     TONGUE BIOPSY N/A 08/10/2021   Procedure: TONGUE BIOPSY;  Surgeon: Beverly Gust, MD;  Location: [facility] ORS;  Service: ENT;  Laterality: N/A;   TOTAL KNEE ARTHROPLASTY Bilateral    TOTAL SHOULDER ARTHROPLASTY Left 07/10/2015   Procedure: TOTAL SHOULDER ARTHROPLASTY;  Surgeon: [name], MD;   Location: [facility] ORS;  Service: Orthopedics;  Laterality: Left;   TOTAL SHOULDER REPLACEMENT      (Not in a hospital admission)  Social History   Socioeconomic History   Marital status: Married    Spouse name: Not on file   Number of children: 2   Years of education: 16   Highest education level: Not on file  Occupational History   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  Patient is right handed.      Live at home with wife   Social Determinants of Health   Financial Resource Strain: Not on file  Food Insecurity: Not on file  Transportation Needs: Not on file  Physical Activity: Not on file  Stress: Not on file  Social Connections: Not on file  Intimate Partner Violence: Not on file    Family History  Problem Relation Age of Onset   Heart attack Mother    Heart attack Father    Breast cancer Sister    Asthma Sister       Review of systems complete and found to be negative unless listed above      PHYSICAL EXAM  General: Well developed, well nourished, in no acute distress HEENT:  Normocephalic and atramatic Neck:  No JVD.  Lungs: Clear bilaterally to auscultation and percussion. Heart: HRRR . Normal S1 and S2 without gallops or murmurs.  Abdomen: Bowel sounds are positive, abdomen soft and non-tender  Msk:  Back normal, normal gait. Normal strength and tone for age. Extremities: No clubbing, cyanosis or edema.   Neuro: Alert and oriented X 3. Psych:  Good affect, responds appropriately  Labs:    Lab Results  Component Value Date   WBC 3.8 (L) 08/14/2022   HGB 8.6 (L) 08/14/2022   HCT 28.6 (L) 08/14/2022   MCV 92.9 08/14/2022   PLT 136 (L) 08/14/2022    Recent Labs  Lab 08/14/22 0247  NA 137  K 4.6  CL 103  CO2 24  BUN 31*  CREATININE 2.69*  CALCIUM 9.1  GLUCOSE 104*   Lab Results  Component Value Date   CKTOTAL 43 04/24/2013   CKMB 2.1 04/24/2013   TROPONINI <0.03 06/07/2017   No results found for: "CHOL" No results found for: "HDL" No results found for: "LDLCALC" No results found for: "TRIG" No results found for: "CHOLHDL" No results found for: "LDLDIRECT"    Radiology: CT Angio Abd/Pel w/ and/or w/o  Result Date: 08/14/2022 Tora Kindred, RT     08/14/2022 11:17 AM Spoke with Dr Blaine Hamper at 11:12  over the phone and he consulted with Dr Candiss Norse for this patient with low GFR of 23. Per Dr Keturah Barre recommendation,  Patient will be hydrated for 3 hours priors to getting CTA  with IVF NS and then will be hydrated after scan. After Dr Blaine Hamper consulted with vascular doctor, this patient needs this exam with contrast. CT will proceed with exam per Dr Edgar Frisk order and will document with exam.   CT CHEST ABDOMEN PELVIS WO CONTRAST  Result Date: 08/14/2022 CLINICAL DATA:  Pneumonia, complications suspected. Low back pain onset 2 days ago worsening with movement. Shortness of breath. EXAM: CT CHEST, ABDOMEN AND PELVIS WITHOUT CONTRAST TECHNIQUE: Multidetector CT imaging of the chest, abdomen and pelvis was performed following the standard protocol without IV contrast. RADIATION DOSE REDUCTION: This exam was performed according to the departmental dose-optimization program which includes automated exposure control, adjustment of the mA and/or kV according to patient size and/or use of iterative reconstruction technique. COMPARISON:  PET-CT skull base to pelvis 09/09/2021, CT abdomen pelvis without contrast 03/31/2021. Most recent chest x-ray was portable chest today, portable chest  08/13/2022. FINDINGS: CT CHEST FINDINGS Cardiovascular: There is mild cardiomegaly. Metal artifact from left chest dual lead pacing system and dual lead wires in the heart. There are CABG changes. Aortic tortuosity and moderate patchy arthrosclerosis with aortic root ectasia up to 3.8 cm and ascending aorta with mild  dilatation to 4.2 cm. The remainder is within normal caliber limits with scattered calcific plaque in the great vessels. There is a prominent pulmonary trunk 3.4 cm indicating arterial hypertension, unchanged. There are normal caliber pulmonary veins. Mediastinum/Nodes: No intrathoracic or axillary adenopathy. Thyroid gland obscured by metallic artifact from bilateral shoulder replacements and left chest pacemaker. Small amount of retained secretions at the right posterolateral tracheal wall, otherwise unremarkable trachea and main bronchi. Lungs/Pleura: Chronic changes in the bases and moderate emphysematous disease with both paraseptal and centrilobular changes. Right lower lobectomy with volume loss and mild asymmetric elevation of the right diaphragm. There is mild bronchial thickening. No pneumonic infiltrate or nodule is seen. No pleural effusion, thickening or pneumothorax. Musculoskeletal: There is osteopenia, degenerative disc disease and spondylosis of the thoracic spine. As above there are bilateral shoulder replacements. CT ABDOMEN PELVIS FINDINGS Hepatobiliary: The liver is unremarkable without contrast. The gallbladder and bile ducts are unremarkable. Pancreas: No focal abnormality. Spleen: Chronic subcentimeter low-attenuation lesion in the central spleen. Probable cyst or hemangioma. Stable. Otherwise unremarkable without contrast. Adrenals/Urinary Tract: There is no adrenal mass. There is bilateral renal cortical thinning. Stable left renal cysts and 1 cm nonobstructive caliceal stone in the superior pole. 2 mm stone or renovascular calcification upper pole right kidney. Perinephric  stranding is also similar. There is no ureteral stone or hydronephrosis. The right side of the bladder obscured by interval new right hip replacement. The visualized bladder normal in thickness. Stomach/Bowel: Small hiatal hernia. Contracted stomach with normal caliber unopacified small bowel. Normal appendix which is well visible. Mild-to-moderate stool retention in the colon including the rectum but no rectal wall thickening or inflammation. Scattered sigmoid diverticula without diverticulitis. Vascular/Lymphatic: Aortoiliac heavy calcific plaques are again noted with aorto bi-iliac stent graft. A large excluded aneurysm sac is again noted and is larger than previously. On the 2 prior studies, the aneurysmal sac measured 6.9 x 7.0 cm, today measuring 7.6 x 7.6 cm. 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 There are endovascular coils in the region of the right internal iliac artery which were noted previously. Reproductive: Old prostatectomy. Other: There are tiny umbilical and inguinal fat hernias. There is no incarcerated hernia. There is no free hemorrhage, free fluid or free air, or inflammatory stranding around the AAA. Multiple pelvic phleboliths. Musculoskeletal: Chronic L5-S1 fusion hardware is again noted with chronic L5 spondylolysis and grade 2 L5-S1 spondylolisthesis, unchanged. There is osteopenia degenerative change of the lumbar spine with discogenic mild grade 1 retrolisthesis unchanged at L1-2 and L2-3, slight dextroscoliosis. Interval right hip replacement. No acute hardware complications. No operative site hematoma. Ankylosis left SI joint. IMPRESSION: 1. COPD and mild chronic bronchitis with prior right lower lobectomy. No focal pneumonia is seen. Chronic prominence of the pulmonary trunk. 2. Aortic and coronary artery atherosclerosis, prior CABG, pacemaker, and abdominal aortobi-iliac stent grafting. 3. 4.2 cm dilatation in the  ascending thoracic aorta. Annual CTA or MRA follow-up recommended improved 4. 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 CTA would be the preferred imaging workup, MRA if the patient cannot have IV contrast. 5. Constipation and diverticulosis. 6. Nonobstructive nephrolithiasis. 7. Degenerative and postsurgical changes of the spine and osteopenia. No acute spinal compression fracture. Electronically Signed   By: Telford Nab M.D.   On: 08/14/2022 05:23   NM Pulmonary Perfusion  Result Date: 08/14/2022 CLINICAL DATA:  This study identified  as missing a report at 4:54 am on 08/14/2022. 81 year old male with shortness of breath for 2 months. EXAM: NUCLEAR MEDICINE PERFUSION LUNG SCAN TECHNIQUE: Perfusion images were obtained in multiple projections after intravenous injection of radiopharmaceutical. Ventilation scans intentionally deferred if perfusion scan and chest x-ray adequate for interpretation during COVID 19 epidemic. RADIOPHARMACEUTICALS:  4.4 mCi Tc-22m MAA IV COMPARISON:  PA and lateral chest radiographs 08/13/2022. Subsequent CT Chest, Abdomen, and Pelvis 08/14/2022 at 0411 hours. FINDINGS: Fairly homogeneous lung perfusion radiotracer activity when accounting for chronic right lung pleural scarring and costophrenic angle blunting demonstrated radiographically, and centrilobular and paraseptal emphysema demonstrated by CT this morning. No suspicious perfusion defect. IMPRESSION: Chronic lung disease. No scintigraphic evidence of pulmonary embolus. Electronically Signed   By: Genevie Ann M.D.   On: 08/14/2022 04:58   DG Chest 2 View  Result Date: 08/14/2022 CLINICAL DATA:  This study identified as missing a report at 4:54 am on 08/14/2022. 81 year old male with shortness of breath for 2 months. EXAM: CHEST - 2 VIEW COMPARISON:  Portable chest 06/10/2022 and earlier. FINDINGS: Chronic CABG and left chest pacemaker. Chronic bilateral shoulder  arthroplasty. Lung volumes and mediastinal contours are stable since 2018. No significant cardiomegaly. Visualized tracheal air column is within normal limits. No pneumothorax or pulmonary edema. Chronic blunting of the right costophrenic angle is not significantly changed since 2018 and appears related to chronic pleural thickening by chest CT in 2021. No acute pulmonary opacity. Stable visualized osseous structures.  Negative visible bowel gas. IMPRESSION: 1. No acute cardiopulmonary abnormality. 2. Chronic right lung pleural thickening, left chest pacemaker, prior CABG. Electronically Signed   By: Genevie Ann M.D.   On: 08/14/2022 04:56   CT Head Wo Contrast  Result Date: 08/14/2022 CLINICAL DATA:  81 year old male with altered mental status. Increasing back pain with movement. EXAM: CT HEAD WITHOUT CONTRAST TECHNIQUE: Contiguous axial images were obtained from the base of the skull through the vertex without intravenous contrast. RADIATION DOSE REDUCTION: This exam was performed according to the departmental dose-optimization program which includes automated exposure control, adjustment of the mA and/or kV according to patient size and/or use of iterative reconstruction technique. COMPARISON:  Brain MRI 01/14/2010.  Head CT 06/10/2022. 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 Small chronic bilateral cerebellar infarcts. Stable cerebral volume. Patchy encephalomalacia and dystrophic calcification in the right parietal lobe is stable. Small area of chronic encephalomalacia in the right inferior frontal gyrus is stable. No midline shift, ventriculomegaly, mass effect, evidence of mass lesion, intracranial hemorrhage or evidence of cortically based acute infarction. Vascular: Calcified atherosclerosis at the skull base. No suspicious intracranial vascular hyperdensity. Skull: No acute osseous abnormality identified. Sinuses/Orbits: Mild  sinus opacification in the left frontal, frontoethmoidal recess and sphenoid sinus have not significantly changed from last month. And overall paranasal sinuses and mastoids are well aerated. Other: No acute orbit or scalp soft tissue finding. IMPRESSION: 1. No acute intracranial abnormality identified. 2. Advanced chronic ischemic disease appears stable by CT since last month. Electronically Signed   By: Genevie Ann M.D.   On: 08/14/2022 04:54   DG Chest Portable 1 View  Result Date: 08/14/2022 CLINICAL DATA:  Shortness of breath, low back pain EXAM: PORTABLE CHEST 1 VIEW COMPARISON:  08/13/2022 FINDINGS: Mild patchy right lower lobe opacity, new, suspicious for pneumonia. Trace right pleural effusion versus chronic pleural thickening. Left lung is clear. No pneumothorax. Mild cardiomegaly. Postsurgical changes related to prior CABG. Left  subclavian pacemaker. Median sternotomy. IMPRESSION: Mild patchy right lower lobe opacity, new, suspicious for pneumonia. Electronically Signed   By: Julian Hy M.D.   On: 08/14/2022 03:17    EKG: Dual sensing with ventricular pacing  ASSESSMENT AND PLAN:   1.  Preoperative cardiovascular assessment.  Based on patients age and comorbidities, the patient poses at least mild to moderate risk for cardiovascular complication during surgical repair of abdominal aortic aneurysm.  The patient appears to be medically optimized.  Patient denies chest pain.  We will chamber pacemaker appears to be functioning appropriately.  High-sensitivity troponin borderline elevated in the absence of chest pain, most likely elevated due to underlying chronic kidney disease. 2.  Known CAD, CABG x3, currently without chest pain 3.  Abdominal aortic aneurysm, pending surgical repair 4.  Status post dual-chamber pacemaker for second-degree AV block 5.  Chronic kidney disease, stage IIIb  Recommendations  1.  Agree with current therapy 2.  Proceed with surgical repair of abdominal  aortic aneurysm 3.  Defer further cardiac diagnostics at this time 4.  Continue metoprolol succinate pre-, peri and postoperatively  Signed: Isaias Cowman MD,PhD, [facility] 08/14/2022, 11:38 AM

## 2022-08-14 NOTE — Assessment & Plan Note (Addendum)
Concern of some COPD exacerbation on admission with some wheezing which has been resolved.  No infiltrate on imaging but there was a concern of mucous plugging. Procalcitonin was elevated at 0.39. Patient received a course of steroids and antibiotics. Currently stable on room air. -Continue to monitor

## 2022-08-14 NOTE — ED Notes (Signed)
Patient transported to CT 

## 2022-08-14 NOTE — Assessment & Plan Note (Addendum)
Lumbar OA, DDD Multiple lobar spine hardware This seems to be a chronic issue, has worsened recently. 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 There is osteopenia degenerative change of the lumbar spine with discogenic mild grade 1 retrolisthesis unchanged at L1-2 and L2-3, slight dextroscoliosis. Unable to obtain MRI due to pacemaker. Neurosurgery is recommending conservative management with brace and outpatient follow-up. -pain control: As needed Percocet, Tylenol -As needed Robaxin -lidoderm

## 2022-08-14 NOTE — Assessment & Plan Note (Addendum)
Hemoglobin slowly trending down.  No active bleeding. -Continue to monitor -Transfuse if below 7 -Continue with iron supplement

## 2022-08-14 NOTE — Assessment & Plan Note (Addendum)
AKI improved.  Creatinine currently at baseline. -Continue to monitor -Avoid nephrotoxins

## 2022-08-14 NOTE — Assessment & Plan Note (Signed)
-  f/u with [facility]

## 2022-08-14 NOTE — ED Notes (Signed)
Patient transported back from CT 

## 2022-08-14 NOTE — Progress Notes (Signed)
Stewardship/id brief note  81 yo male with pqcemaker, aprtobifem bypass admitted with back pain and found to have e faecalis bacteremia     Will need to r/o ie/pacer infection with tee. Question of graft involvement will need vascular surgery input  Abx to be changed to ampicillin for now Repeat bcx needed Back pain will need lspine mri as well

## 2022-08-14 NOTE — Assessment & Plan Note (Addendum)
Blood pressure mildly elevated. Home Cozaar was switched with amlodipine. -Continue with amlodipine and metoprolol -Continue with as needed hydralazine

## 2022-08-14 NOTE — ED Notes (Signed)
Pt sitting up eating breakfast, will give duoneb after

## 2022-08-14 NOTE — ED Triage Notes (Signed)
PER EMS: pt is from home with c/o lower back pain onset a couple of days ago worsens with movement. The pain started in his left leg but has since gone away in his leg. He reports pain was aggravated during physical therapy. Significant cardiac history. Denies chest pain. He does endorse exertional SOB.    BP- 180/86, HR-74, 100% RA

## 2022-08-14 NOTE — Assessment & Plan Note (Addendum)
S/p of AAA repair 2018 by Dr. Lucky Cowboy. Now the size has enlarged from 6.9 to 7.6 cm -consulted Dr. Lorenso Courier of [facility]  Addendum: Per Dr. Lorenso Courier, patient likely need surgical intervention. Pt will need CTA as soon as possible.  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 Candiss Norse.  He recommended to treat patient with IV fluid for few hours before doing CTA.  Patient is started on NS at 75/h. Will do CTA at three hours (14:00) after staring IVF. Pt will need to continue IVF until tomorrow AM after CTA is done.  -CTA of abd/pelvis is order (will be done at 14:00) -continue NS at 75cc/h -will hold [place]

## 2022-08-14 NOTE — ED Provider Notes (Signed)
[facility] Provider Note    Event Date/Time   First MD Initiated Contact with Patient 08/14/22 [phone]     (approximate)   History   Back Pain   HPI  Rick Mcbride. is a 81 y.o. male brought to the ED via EMS from home with a chief complaint of nontraumatic lower back pain and generalized weakness.  Wife states patient was hospitalized in August for right hip repair; went [facility] for rehab.  Has been home since mid September and she states he has "gone downhill" since.  His weakness has increased to the point where he cannot walk.  Patient has chronic back issues and notes lower back pain which began 2 days ago.  Reports pain aggravated during physical therapy and worsens with movement.  Denies unilateral extremity weakness/numbness/tingling.  Does complain of some shortness of breath.  Patient had an x-ray and nuclear pulmonary perfusion test done by PCP yesterday; results not resulted.  Denies fever, chest pain, abdominal pain, nausea, vomiting or dysuria.  Endorses exertional shortness of breath.  Wife states patient has appeared confused recently.     Past Medical History   Past Medical History:  Diagnosis Date   AAA (abdominal aortic aneurysm) ([place])    a.) s/p EVAR 01/05/2017. b.) native aneurysm sac 6.6 x 6.9 cm by CT on 03/31/2021   Abnormality of tongue    a.) CT head/neck 07/10/2021 --> asymmetric soft tissue at the RIGHT tongue base with a superficial 8 mm lesion.   Anemia    Aneurysm of right common iliac artery (HCC)    a.) measured 2.7 cm by CT on 03/31/2021   Anxiety    Aortic atherosclerosis (HCC)    Atrophic kidney    B12 deficiency    CAD (coronary artery disease)    Cervical radiculopathy    Chronic airway obstruction (HCC)    Chronic kidney disease (CKD), stage III (moderate) (HCC)    Chronic pain syndrome 06/16/2021   Chronic right shoulder pain 11/12/2019   Chronic tension headaches    Chronic, continuous use of  opioids 06/16/2021   Coronary artery disease    DDD (degenerative disc disease), lumbar    Degenerative disc disease, lumbar    with lumbar radiculopathy   Elbow fracture, left    GERD (gastroesophageal reflux disease)    H/O adenomatous polyp of colon    H/O hemorrhoids    HTN (hypertension)    Hyperlipidemia    Meralgia paresthetica    Mild cognitive impairment    Nephrolithiasis    Neuralgia    Numbness of right foot 02/19/2021   Osteoarthritis    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   Presence of permanent cardiac pacemaker    Prostate cancer [facility])    a.) s/p prostatectomy   S/P CABG x 3 05/01/2004   a.) LVEF 40-49%; LIMA-LAD, SVG-OM1, SVG-PDA   Second degree AV block    Sinoatrial node dysfunction (HCC)    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   Chronic pain syndrome 06/16/2021   Numbness of right foot 02/19/2021   Aortic atherosclerosis ([place]) 10/15/2020   Rotator cuff tendinitis, right 01/25/2020   Status post right shoulder hemiarthroplasty 01/25/2020   Anemia in chronic kidney disease 11/29/2019   Benign hypertensive kidney disease with chronic kidney disease 11/29/2019   Chronic kidney disease, stage IV (severe) ([place]) 11/29/2019   Hematuria 11/29/2019   Proteinuria 11/29/2019   Secondary  hyperparathyroidism of renal origin ([place]) 11/29/2019   Chronic right shoulder pain 11/12/2019   Thoracic aortic aneurysm without rupture ([place]) 10/09/2019   Weakness 06/07/2017   Olecranon fracture, left, closed, initial encounter 05/27/2017   Spondylolisthesis of lumbosacral region 03/16/2017   Low back pain 02/01/2017   Escherichia coli (E. coli) infection 01/28/2017   Elevated troponin 01/28/2017   Generalized weakness 01/28/2017   Essential hypertension 01/28/2017   UTI (urinary tract infection) 01/26/2017   AAA (abdominal aortic aneurysm) without rupture ([place]) 11/23/2016   Chronic obstructive pulmonary disease ([place]) 10/09/2015   Personal history of diseases of skin or subcutaneous tissue 10/09/2015   Malignant neoplasm of prostate ([place]) 10/09/2015   Pure hypercholesterolemia 10/09/2015   Sinoatrial node dysfunction ([place]) 10/09/2015   History of surgical procedure 08/22/2015   Status post total shoulder replacement 07/10/2015   Arthritis of shoulder region, degenerative 06/10/2015   Benign essential HTN 06/03/2015   Absolute anemia 04/12/2015   Atrophic kidney 04/12/2015   Essential (primary) hypertension 04/12/2015   Acid reflux 04/12/2015   Cancer of lung ([place]) 04/12/2015   CA of prostate ([place]) 04/12/2015   H/O adenomatous polyp of colon 01/15/2015   Temporary cerebral vascular dysfunction 11/21/2014   Calcific shoulder tendinitis 08/01/2014   Cervical nerve root disorder 04/07/2012   CAD S/P CABG x 3 05/01/2004     Past Surgical History   Past Surgical History:  Procedure Laterality Date   CATARACT EXTRACTION Bilateral    COLONOSCOPY     COLONOSCOPY     COLONOSCOPY WITH PROPOFOL N/A 10/20/2015   Procedure: COLONOSCOPY WITH PROPOFOL;  Surgeon: Manya Silvas, MD;  Location[facility] END[facility]COPY;  Service: Endoscopy;  Laterality: N/A;   COLONOSCOPY WITH PROPOFOL N/A 11/28/2020   Procedure: COLONOSCOPY WITH PROPOFOL;  Surgeon: Robert Bellow, MD;  Location: [facility]  ENDOSCOPY;  Service: Endoscopy;  Laterality: N/A;   CORONARY ARTERY BYPASS GRAFT N/A 05/01/2004   Procedure: 3v CABG (LIMA-LAD, SVG-OM1, SVG-PDA); Location: [facility]; Surgeon: Ander Gaster, MD   EMBOLIZATION Right 12/27/2016   Procedure: Embolization;  Surgeon: Algernon Huxley, MD;  Location: [place] CV LAB;  Service: Cardiovascular;  Laterality: Right;   ENDOVASCULAR REPAIR/STENT GRAFT N/A 01/05/2017   Procedure: Endovascular Repair/Stent Graft;  Surgeon: Algernon Huxley, MD;  Location: [place] CV LAB;  Service: Cardiovascular;  Laterality: N/A;   HIP ARTHROPLASTY Right 06/10/2022   Procedure: ARTHROPLASTY BIPOLAR HIP (HEMIARTHROPLASTY);  Surgeon: [name], MD;  Location: [facility] ORS;  Service: Orthopedics;  Laterality: Right;   INSERT / REPLACE / REMOVE PACEMAKER     JOINT REPLACEMENT     shoulder and knees   KNEE ARTHROSCOPY     LUNG LOBECTOMY Right 01/31/2013   Procedure: RIGHT PULMONARY LOBECTOMY; Location: [place]; Surgeon: Nestor Lewandowsky, MD   MICROLARYNGOSCOPY Right  08/10/2021   Procedure: MICRODIRECT LARYNGOSCOPY WITH BIOPSY OF TONGUE BASE;  Surgeon: Beverly Gust, MD;  Location: [facility] ORS;  Service: ENT;  Laterality: Right;   PACEMAKER INSERTION  12/2012   Dual chanber pacemaker generator   POLYPECTOMY     POSTERIOR LUMBAR FUSION     Procedure: POSTERIOR LUMBAR INTERBODY FUSION, INTERBODY PROSTHESIS, POSTERIOR LATERAL ARTHRODESIS, POSTERIOR NON-SEGMENTAL INSTRUMENTATION LUMBAR FIVE- SACRAL ONE; Location: [facility]; Surgeon: Newman Pies, MD   PROSTATECTOMY     TONGUE BIOPSY N/A 08/10/2021   Procedure: TONGUE BIOPSY;  Surgeon: Beverly Gust, MD;  Location: [facility] ORS;  Service: ENT;  Laterality: N/A;   TOTAL KNEE ARTHROPLASTY Bilateral    TOTAL SHOULDER ARTHROPLASTY Left 07/10/2015   Procedure: TOTAL SHOULDER ARTHROPLASTY;  Surgeon: [name], MD;  Location: [facility] ORS;  Service: Orthopedics;  Laterality: Left;   TOTAL SHOULDER REPLACEMENT       Home Medications    Prior to Admission medications   Medication Sig Start Date End Date Taking? Authorizing Provider  acetaminophen (TYLENOL) 500 MG tablet Take 500 mg by mouth every 4 (four) hours as needed for moderate pain.    [provider]  apixaban (ELIQUIS) 2.5 MG TABS tablet Take 1 tablet (2.5 mg total) by mouth 2 (two) times daily. 06/12/22   Lattie Corns, PA-C  aspirin EC 81 MG tablet Take 81 mg by mouth daily.     [provider]  atorvastatin (LIPITOR) 20 MG tablet Take 40 mg by mouth at bedtime.    [provider]  calcitRIOL (ROCALTROL) 0.25 MCG capsule Take 0.25 mcg by mouth daily. 05/12/22   [provider]  Calcium Carb-Cholecalciferol (CALCIUM 600 + D PO) Take 1 tablet by mouth daily.    [provider]  cetirizine (ZYRTEC) 10 MG tablet Take 10 mg by mouth at bedtime.    [provider]  chlorproMAZINE (THORAZINE) 10 MG tablet Take 1 tablet (10 mg total) by mouth 4 (four) times daily for 2 days. 06/15/22 06/30/22  Annita Brod, MD  clopidogrel (PLAVIX) 75 MG tablet Take 1 tablet (75 mg total) by mouth daily. Restart Plavix after completion of Eliquis prescription. 06/12/22   Lattie Corns, PA-C  docusate sodium (COLACE) 100 MG capsule Take 300 mg by mouth at bedtime.    [provider]  donepezil (ARICEPT) 10 MG tablet Take 10 mg by mouth at bedtime. 05/12/22   [provider]  ezetimibe (ZETIA) 10 MG tablet Take 10 mg by mouth daily. 10/17/18 06/30/22  [provider]  feeding supplement (ENSURE ENLIVE / ENSURE PLUS) LIQD Take 237 mLs by mouth 2 (two) times daily between meals. 06/15/22   Annita Brod, MD  Ferrous Sulfate (IRON SLOW RELEASE) 140 (45 Fe) MG TBCR Take 1 tablet by mouth 2 (two) times daily.    [provider]  loratadine (CLARITIN) 10 MG tablet Take 10 mg by mouth daily. 05/15/22   [provider]  losartan (COZAAR) 100 MG tablet Take 100 mg by mouth daily.    [provider]  melatonin 5 MG TABS Take 10 mg by mouth at bedtime.    [provider]  methocarbamol (ROBAXIN) 500 MG tablet Take 1 tablet (500 mg total) by mouth at bedtime as needed for muscle spasms. 06/15/22   Annita Brod, MD  metoCLOPramide (REGLAN) 5 MG tablet Take 1 tablet (5 mg total) by mouth 3 (three) times daily for 2 days. Patient not taking: Reported on 06/30/2022 06/15/22  06/17/22  Annita Brod, MD  metoprolol succinate (TOPROL-XL) 25 MG 24 hr tablet Take 25 mg by mouth daily. 05/24/17   [provider]  Multiple Vitamin (MULTIVITAMIN WITH MINERALS) TABS tablet Take 1 tablet by mouth daily.     [provider]  Multiple Vitamins-Minerals (PRESERVISION AREDS 2) CAPS Take 1 tablet by mouth 2 (two) times daily.    [provider]  ondansetron (ZOFRAN) 4 MG tablet Take 1 tablet (4 mg total) by mouth every 6 (six) hours as needed for nausea. 06/12/22   Lattie Corns, PA-C  pantoprazole (PROTONIX) 40 MG tablet Take 40 mg by mouth daily.    [provider]  polyethylene glycol (MIRALAX) 17 g packet Take 17 g by mouth daily as needed for mild constipation. 06/15/22   Annita Brod, MD  Probiotic Product (PROBIOTIC PO) Take 1 capsule by mouth daily.    [provider]  traMADol (ULTRAM) 50 MG tablet Take 1 tablet (50 mg total) by mouth every 6 (six) hours as needed for moderate pain. 06/14/22   Lattie Corns, PA-C  zolpidem (AMBIEN) 10 MG tablet Take 1 tablet (10 mg total) by mouth at bedtime. 06/15/22   Annita Brod, MD     Allergies  Patient has no known allergies.   Family History   Family History  Problem Relation Age of Onset   Heart attack Mother    Heart attack Father    Breast cancer Sister    Asthma Sister      Physical Exam  Triage Vital Signs: ED Triage Vitals  Enc Vitals Group     BP 08/14/22 0234 (!) 164/94     Pulse Rate 08/14/22 0234 63     Resp 08/14/22 0234 17     Temp 08/14/22  0234 98.3 F (36.8 C)     Temp Source 08/14/22 0234 Oral     SpO2 08/14/22 0234 98 %     Weight 08/14/22 0232 220 lb (99.8 kg)     Height 08/14/22 0232 5\' 11"  (1.803 m)     Head Circumference --      [facility] Flow --      Pain Score 08/14/22 0232 3     Pain Loc --      Pain Edu? --      Excl. in [place]? --     Updated Vital Signs: BP (!) 140/68   Pulse 73   Temp 98.3 F (36.8 C) (Oral)   Resp 16   Ht 5\' 11"  (1.803 m)   Wt 99.8 kg   SpO2 100%   BMI 30.68 kg/m    General: Awake, no distress.  CV:  RRR.  Good peripheral perfusion. 2+ bilateral femoral pulses. Resp:  Normal effort.  CTA B. Abd:  Nontender to light or deep palpation.  No abdominal bruits.  No distention.  Other:  No truncal vesicles.  Alert and oriented x3.  CN II toXII grossly intact.  5/5 motor strength and sensation all extremities. MAEx4.   ED Results / Procedures / Treatments  Labs (all labs ordered are listed, but only abnormal results are displayed) Labs Reviewed  BASIC METABOLIC PANEL - Abnormal; Notable for the following components:      Result Value   Glucose, Bld 104 (*)    BUN 31 (*)    Creatinine, Ser 2.69 (*)    GFR, Estimated 23 (*)    All other components within normal limits  CBC - Abnormal; Notable for  the following components:   WBC 3.8 (*)    RBC 3.08 (*)    Hemoglobin 8.6 (*)    HCT 28.6 (*)    RDW 16.1 (*)    Platelets 136 (*)    All other components within normal limits  TROPONIN I (HIGH SENSITIVITY) - Abnormal; Notable for the following components:   Troponin I (High Sensitivity) 31 (*)    All other components within normal limits  TROPONIN I (HIGH SENSITIVITY) - Abnormal; Notable for the following components:   Troponin I (High Sensitivity) 30 (*)    All other components within normal limits  CULTURE, BLOOD (ROUTINE X 2)  CULTURE, BLOOD (ROUTINE X 2)  RESP PANEL BY RT-PCR (FLU A&B, COVID) ARPGX2  URINE CULTURE  LACTIC ACID, PLASMA  PROCALCITONIN  URINALYSIS, ROUTINE W  REFLEX MICROSCOPIC     EKG  ED ECG REPORT I, Alfredia Desanctis J, the attending physician, personally viewed and interpreted this ECG.   Date: 08/14/2022  EKG Time: 0238  Rate: 68  Rhythm: Pacemaker  Axis: Pacemaker  Intervals: Pacemaker  ST&T Change: Pacemaker Change from 06/10/2022 at that time patient was normal sinus rhythm rate of 87 with inverted T waves inferior laterally    RADIOLOGY I have independently visualized patient's x-ray and CT scans as well as noted the radiology interpretation:  Chest x-ray: Right lower lobe opacity  CT chest: Bronchitis, COPD; no focal pneumonia  CT abdomen/pelvis: Now 7.6 cm AAA status post endograft; MRA imaging work-up since patient cannot have IV contrast (due to CKD)  CT head: No ICH  Official radiology report(s): CT CHEST ABDOMEN PELVIS WO CONTRAST  Result Date: 08/14/2022 CLINICAL DATA:  Pneumonia, complications suspected. Low back pain onset 2 days ago worsening with movement. Shortness of breath. EXAM: CT CHEST, ABDOMEN AND PELVIS WITHOUT CONTRAST TECHNIQUE: Multidetector CT imaging of the chest, abdomen and pelvis was performed following the standard protocol without IV contrast. RADIATION DOSE REDUCTION: This exam was performed according to the departmental dose-optimization program which includes automated exposure control, adjustment of the mA and/or kV according to patient size and/or use of iterative reconstruction technique. COMPARISON:  PET-CT skull base to pelvis 09/09/2021, CT abdomen pelvis without contrast 03/31/2021. Most recent chest x-ray was portable chest today, portable chest 08/13/2022. FINDINGS: CT CHEST FINDINGS Cardiovascular: There is mild cardiomegaly. Metal artifact from left chest dual lead pacing system and dual lead wires in the heart. There are CABG changes. Aortic tortuosity and moderate patchy arthrosclerosis with aortic root ectasia up to 3.8 cm and ascending aorta with mild dilatation to 4.2 cm. The remainder is  within normal caliber limits with scattered calcific plaque in the great vessels. There is a prominent pulmonary trunk 3.4 cm indicating arterial hypertension, unchanged. There are normal caliber pulmonary veins. Mediastinum/Nodes: No intrathoracic or axillary adenopathy. Thyroid gland obscured by metallic artifact from bilateral shoulder replacements and left chest pacemaker. Small amount of retained secretions at the right posterolateral tracheal wall, otherwise unremarkable trachea and main bronchi. Lungs/Pleura: Chronic changes in the bases and moderate emphysematous disease with both paraseptal and centrilobular changes. Right lower lobectomy with volume loss and mild asymmetric elevation of the right diaphragm. There is mild bronchial thickening. No pneumonic infiltrate or nodule is seen. No pleural effusion, thickening or pneumothorax. Musculoskeletal: There is osteopenia, degenerative disc disease and spondylosis of the thoracic spine. As above there are bilateral shoulder replacements. CT ABDOMEN PELVIS FINDINGS Hepatobiliary: The liver is unremarkable without contrast. The gallbladder and bile ducts are unremarkable. Pancreas: No focal  abnormality. Spleen: Chronic subcentimeter low-attenuation lesion in the central spleen. Probable cyst or hemangioma. Stable. Otherwise unremarkable without contrast. Adrenals/Urinary Tract: There is no adrenal mass. There is bilateral renal cortical thinning. Stable left renal cysts and 1 cm nonobstructive caliceal stone in the superior pole. 2 mm stone or renovascular calcification upper pole right kidney. Perinephric stranding is also similar. There is no ureteral stone or hydronephrosis. The right side of the bladder obscured by interval new right hip replacement. The visualized bladder normal in thickness. Stomach/Bowel: Small hiatal hernia. Contracted stomach with normal caliber unopacified small bowel. Normal appendix which is well visible. Mild-to-moderate stool  retention in the colon including the rectum but no rectal wall thickening or inflammation. Scattered sigmoid diverticula without diverticulitis. Vascular/Lymphatic: Aortoiliac heavy calcific plaques are again noted with aorto bi-iliac stent graft. A large excluded aneurysm sac is again noted and is larger than previously. On the 2 prior studies, the aneurysmal sac measured 6.9 x 7.0 cm, today measuring 7.6 x 7.6 cm. 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 There are endovascular coils in the region of the right internal iliac artery which were noted previously. Reproductive: Old prostatectomy. Other: There are tiny umbilical and inguinal fat hernias. There is no incarcerated hernia. There is no free hemorrhage, free fluid or free air, or inflammatory stranding around the AAA. Multiple pelvic phleboliths. Musculoskeletal: Chronic L5-S1 fusion hardware is again noted with chronic L5 spondylolysis and grade 2 L5-S1 spondylolisthesis, unchanged. There is osteopenia degenerative change of the lumbar spine with discogenic mild grade 1 retrolisthesis unchanged at L1-2 and L2-3, slight dextroscoliosis. Interval right hip replacement. No acute hardware complications. No operative site hematoma. Ankylosis left SI joint. IMPRESSION: 1. COPD and mild chronic bronchitis with prior right lower lobectomy. No focal pneumonia is seen. Chronic prominence of the pulmonary trunk. 2. Aortic and coronary artery atherosclerosis, prior CABG, pacemaker, and abdominal aortobi-iliac stent grafting. 3. 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 Annual CTA or MRA follow-up recommended improved 4. 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 CTA would be the preferred imaging workup, MRA if the patient cannot have IV contrast. 5. Constipation and diverticulosis. 6. Nonobstructive nephrolithiasis. 7. Degenerative and  postsurgical changes of the spine and osteopenia. No acute spinal compression fracture. Electronically Signed   By: Telford Nab M.D.   On: 08/14/2022 05:23   NM Pulmonary Perfusion  Result Date: 08/14/2022 CLINICAL DATA:  This study identified as missing a report at 4:54 am on 08/14/2022. 81 year old male with shortness of breath for 2 months. EXAM: NUCLEAR MEDICINE PERFUSION LUNG SCAN TECHNIQUE: Perfusion images were obtained in multiple projections after intravenous injection of radiopharmaceutical. Ventilation scans intentionally deferred if perfusion scan and chest x-ray adequate for interpretation during COVID 19 epidemic. RADIOPHARMACEUTICALS:  4.4 mCi Tc-55m MAA IV COMPARISON:  PA and lateral chest radiographs 08/13/2022. Subsequent CT Chest, Abdomen, and Pelvis 08/14/2022 at 0411 hours. FINDINGS: Fairly homogeneous lung perfusion radiotracer activity when accounting for chronic right lung pleural scarring and costophrenic angle blunting demonstrated radiographically, and centrilobular and paraseptal emphysema demonstrated by CT this morning. No suspicious perfusion defect. IMPRESSION: Chronic lung disease. No scintigraphic evidence of pulmonary embolus. Electronically Signed   By: Genevie Ann M.D.   On: 08/14/2022 04:58   DG Chest 2 View  Result Date: 08/14/2022 CLINICAL DATA:  This study identified as missing a report at 4:54 am on 08/14/2022.  81 year old male with shortness of breath for 2 months. EXAM: CHEST - 2 VIEW COMPARISON:  Portable chest 06/10/2022 and earlier. FINDINGS: Chronic CABG and left chest pacemaker. Chronic bilateral shoulder arthroplasty. Lung volumes and mediastinal contours are stable since 2018. No significant cardiomegaly. Visualized tracheal air column is within normal limits. No pneumothorax or pulmonary edema. Chronic blunting of the right costophrenic angle is not significantly changed since 2018 and appears related to chronic pleural thickening by chest CT in 2021. No acute  pulmonary opacity. Stable visualized osseous structures.  Negative visible bowel gas. IMPRESSION: 1. No acute cardiopulmonary abnormality. 2. Chronic right lung pleural thickening, left chest pacemaker, prior CABG. Electronically Signed   By: Genevie Ann M.D.   On: 08/14/2022 04:56   CT Head Wo Contrast  Result Date: 08/14/2022 CLINICAL DATA:  81 year old male with altered mental status. Increasing back pain with movement. EXAM: CT HEAD WITHOUT CONTRAST TECHNIQUE: Contiguous axial images were obtained from the base of the skull through the vertex without intravenous contrast. RADIATION DOSE REDUCTION: This exam was performed according to the departmental dose-optimization program which includes automated exposure control, adjustment of the mA and/or kV according to patient size and/or use of iterative reconstruction technique. COMPARISON:  Brain MRI 01/14/2010.  Head CT 06/10/2022. 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 Small chronic bilateral cerebellar infarcts. Stable cerebral volume. Patchy encephalomalacia and dystrophic calcification in the right parietal lobe is stable. Small area of chronic encephalomalacia in the right inferior frontal gyrus is stable. No midline shift, ventriculomegaly, mass effect, evidence of mass lesion, intracranial hemorrhage or evidence of cortically based acute infarction. Vascular: Calcified atherosclerosis at the skull base. No suspicious intracranial vascular hyperdensity. Skull: No acute osseous abnormality identified. Sinuses/Orbits: Mild sinus opacification in the left frontal, frontoethmoidal recess and sphenoid sinus have not significantly changed from last month. And overall paranasal sinuses and mastoids are well aerated. Other: No acute orbit or scalp soft tissue finding. IMPRESSION: 1. No acute intracranial abnormality identified. 2. Advanced chronic ischemic disease appears stable by CT since  last month. Electronically Signed   By: Genevie Ann M.D.   On: 08/14/2022 04:54   DG Chest Portable 1 View  Result Date: 08/14/2022 CLINICAL DATA:  Shortness of breath, low back pain EXAM: PORTABLE CHEST 1 VIEW COMPARISON:  08/13/2022 FINDINGS: Mild patchy right lower lobe opacity, new, suspicious for pneumonia. Trace right pleural effusion versus chronic pleural thickening. Left lung is clear. No pneumothorax. Mild cardiomegaly. Postsurgical changes related to prior CABG. Left subclavian pacemaker. Median sternotomy. IMPRESSION: Mild patchy right lower lobe opacity, new, suspicious for pneumonia. Electronically Signed   By: Julian Hy M.D.   On: 08/14/2022 03:17     PROCEDURES:  Critical Care performed: Yes, see critical care procedure note(s)  CRITICAL CARE Performed by: Paulette Blanch   Total critical care time: 30 minutes  Critical care time was exclusive of separately billable procedures and treating other patients.  Critical care was necessary to treat or prevent imminent or life-threatening deterioration.  Critical care was time spent personally by me on the following activities: development of treatment plan with patient and/or surrogate as well as nursing, discussions with consultants, evaluation of patient's response to treatment, examination of patient, obtaining history from patient or surrogate, ordering and performing treatments and interventions, ordering and review of laboratory studies, ordering and review of radiographic studies, pulse oximetry and re-evaluation of patient's condition.   Marland Kitchen1-3 Lead EKG Interpretation  Performed by:  Paulette Blanch, MD Authorized by: Paulette Blanch, MD     Interpretation: normal     ECG rate:  68   ECG rate assessment: normal     Rhythm: sinus rhythm     Ectopy: none     Conduction: normal   Comments:     Patient placed on cardiac monitor to evaluate for arrhythmias    MEDICATIONS ORDERED IN ED: Medications  morphine (PF) 2 MG/ML  injection 2 mg (2 mg Intravenous Given 08/14/22 0417)  ondansetron (ZOFRAN) injection 4 mg (4 mg Intravenous Given 08/14/22 0417)  sodium chloride 0.9 % bolus 1,000 mL (0 mLs Intravenous Stopped 08/14/22 0526)  diazepam (VALIUM) injection 2 mg (2 mg Intravenous Given 08/14/22 0437)  methylPREDNISolone sodium succinate (SOLU-MEDROL) 125 mg/2 mL injection 80 mg (80 mg Intravenous Given 08/14/22 0549)  ipratropium-albuterol (DUONEB) 0.5-2.5 (3) MG/3ML nebulizer solution 3 mL (3 mLs Nebulization Given 08/14/22 0549)     IMPRESSION / MDM / [place] / ED COURSE  I reviewed the triage vital signs and the nursing notes.                             81 year old male presenting with low back pain and exertional shortness of breath. Differential includes, but is not limited to, viral syndrome, bronchitis including COPD exacerbation, pneumonia, reactive airway disease including asthma, CHF including exacerbation with or without pulmonary/interstitial edema, pneumothorax, ACS, thoracic trauma, and pulmonary embolism.  I have personally reviewed patient's records and notes PCP office visit on 08/03/2022 and orders for x-ray and nuclear pulmonary perfusion scan yesterday; unfortunately perfusion scan has not been resulted.  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  Patient's presentation is most consistent with acute presentation with potential threat to life or bodily function.  The patient is on the cardiac monitor to evaluate for evidence of arrhythmia and/or significant heart rate changes.  Chest x-ray suspicious for right lower lobe pneumonia.  Laboratory results demonstrate white count 3.8, stable anemia, stable renal insufficiency creatinine 2.69, minimally elevated troponin which may be secondary to renal disease.   Will obtain CT head for wife's complaints of altered mentation, CT chest to further characterize infection, CT abdomen/pelvis given patient's prior history of AAA endovascular repair with low back pain.  Initiate IV fluid resuscitation, IV morphine for pain.  Anticipate hospitalization.  Clinical Course as of 08/14/22 0550  Sat Aug 14, 2022  0536 Pain better after IV morphine; patient was having back spasms.  Improved after IV Valium.  CT head without ICH.  CT chest demonstrates COPD/bronchitis, no focal infiltrate seen.  We will add lower dose IV Solu-Medrol and DuoNeb for shortness of breath issues.  CT abdomen/pelvis demonstrates AAA without hemorrhage, free fluid, free air or inflammatory stranding, status post endograft now 7.6 cm; could get MRA inpatient for further work-up. Unsure if patient can have given his pacemaker. Follows with Dr. Lucky Cowboy from vascular surgery, will recommend consult while inpatient. Have consulted hospitalist services for evaluation and admission. [JS]  [id number] Results of nuclear medicine pulmonary perfusion scan from yesterday have resulted which is negative for PE. [JS]    Clinical Course User Index [JS] Paulette Blanch, MD     FINAL CLINICAL IMPRESSION(S) / ED DIAGNOSES   Final diagnoses:  Acute midline low  back pain without sciatica  Elevated troponin  AKI (acute kidney injury) ([place])  SOB (shortness of breath)  Bronchitis  Generalized weakness     Rx / DC Orders   ED Discharge Orders     None        Note:  This document was prepared using Dragon voice recognition software and may include unintentional dictation errors.   Paulette Blanch, MD 08/14/22 [phone]

## 2022-08-14 NOTE — Procedures (Deleted)
Spoke with Dr Blaine Hamper at 11:12  over the phone and he consulted with Dr Candiss Norse for this patient with low GFR of 23. Per Dr Keturah Barre recommendation,  Patient will be hydrated for 3 hours priors to getting CTA  with IVF NS and then will be hydrated after scan. After Dr Blaine Hamper consulted with vascular doctor, this patient needs this exam with contrast. CT will proceed with exam per Dr Edgar Frisk order and will document with exam.

## 2022-08-14 NOTE — Assessment & Plan Note (Signed)
  BMI= 30.68  and BW= 99.8 -Diet and exercise.   -Encourage to lose weight.

## 2022-08-14 NOTE — Assessment & Plan Note (Addendum)
-   Xanax 0.25 mg bid PRN

## 2022-08-14 NOTE — Progress Notes (Signed)
PHARMACY - PHYSICIAN COMMUNICATION CRITICAL VALUE ALERT - BLOOD CULTURE IDENTIFICATION (BCID)  Rick T Tyray Proch. is an 81 y.o. male who presented to [facility] on 08/14/2022 with a chief complaint of back pain.   Assessment: Admitted with back pain and COPD exacerbation. BCID detected Enterococcus faecalis (no resistance). Gram stain GPCs in 3/4 bottles.  Name of physician (or Provider) Contacted: Sharion Settler, NP  Current antibiotics:   Changes to prescribed antibiotics recommended:  Recommendations accepted by provider  Start ampicillin IV 2 grams every 8 hours (dose adjusted for renal function)  Results for orders placed or performed during the hospital encounter of 08/14/22  Blood Culture ID Panel (Reflexed) (Collected: 08/14/2022  3:57 AM)  Result Value Ref Range   Enterococcus faecalis DETECTED (A) NOT DETECTED   Enterococcus Faecium NOT DETECTED NOT DETECTED   Listeria monocytogenes NOT DETECTED NOT DETECTED   Staphylococcus species NOT DETECTED NOT DETECTED   Staphylococcus aureus (BCID) NOT DETECTED NOT DETECTED   Staphylococcus epidermidis NOT DETECTED NOT DETECTED   Staphylococcus lugdunensis NOT DETECTED NOT DETECTED   Streptococcus species NOT DETECTED NOT DETECTED   Streptococcus agalactiae NOT DETECTED NOT DETECTED   Streptococcus pneumoniae NOT DETECTED NOT DETECTED   Streptococcus pyogenes NOT DETECTED NOT DETECTED   A.calcoaceticus-baumannii NOT DETECTED NOT DETECTED   Bacteroides fragilis NOT DETECTED NOT DETECTED   Enterobacterales NOT DETECTED NOT DETECTED   Enterobacter cloacae complex NOT DETECTED NOT DETECTED   Escherichia coli NOT DETECTED NOT DETECTED   Klebsiella aerogenes NOT DETECTED NOT DETECTED   Klebsiella oxytoca NOT DETECTED NOT DETECTED   Klebsiella pneumoniae NOT DETECTED NOT DETECTED   Proteus species NOT DETECTED NOT DETECTED   Salmonella species NOT DETECTED NOT DETECTED   Serratia marcescens NOT DETECTED NOT DETECTED   Haemophilus  influenzae NOT DETECTED NOT DETECTED   Neisseria meningitidis NOT DETECTED NOT DETECTED   Pseudomonas aeruginosa NOT DETECTED NOT DETECTED   Stenotrophomonas maltophilia NOT DETECTED NOT DETECTED   Candida albicans NOT DETECTED NOT DETECTED   Candida auris NOT DETECTED NOT DETECTED   Candida glabrata NOT DETECTED NOT DETECTED   Candida krusei NOT DETECTED NOT DETECTED   Candida parapsilosis NOT DETECTED NOT DETECTED   Candida tropicalis NOT DETECTED NOT DETECTED   Cryptococcus neoformans/gattii NOT DETECTED NOT DETECTED   Vancomycin resistance NOT DETECTED NOT DETECTED    Wynelle Cleveland 08/14/2022  8:20 PM

## 2022-08-14 NOTE — Assessment & Plan Note (Addendum)
2D echo on 06/14/2022 showed EF of 60 to 65% with grade 1 diastolic dysfunction.  Patient does not have leg edema.  No pulm edema chest x-ray.  CHF seem to be compensated. -Continue to monitor

## 2022-08-14 NOTE — ED Notes (Signed)
Wife returned phone. Wife stated she was going to the house to retrieve her charger real quick.

## 2022-08-14 NOTE — Assessment & Plan Note (Addendum)
History of lung cancer. -s/p of RRL lobectomy and radiation therapy.  No acute issues.

## 2022-08-15 ENCOUNTER — Encounter: Payer: Self-pay | Admitting: Radiology

## 2022-08-15 DIAGNOSIS — R7881 Bacteremia: Secondary | ICD-10-CM | POA: Diagnosis not present

## 2022-08-15 DIAGNOSIS — B952 Enterococcus as the cause of diseases classified elsewhere: Secondary | ICD-10-CM | POA: Diagnosis present

## 2022-08-15 DIAGNOSIS — J441 Chronic obstructive pulmonary disease with (acute) exacerbation: Secondary | ICD-10-CM | POA: Diagnosis not present

## 2022-08-15 LAB — CBC
HCT: 25.5 % — ABNORMAL LOW (ref 39.0–52.0)
Hemoglobin: 7.8 g/dL — ABNORMAL LOW (ref 13.0–17.0)
MCH: 27.8 pg (ref 26.0–34.0)
MCHC: 30.6 g/dL (ref 30.0–36.0)
MCV: 90.7 fL (ref 80.0–100.0)
Platelets: 149 10*3/uL — ABNORMAL LOW (ref 150–400)
RBC: 2.81 MIL/uL — ABNORMAL LOW (ref 4.22–5.81)
RDW: 15.8 % — ABNORMAL HIGH (ref 11.5–15.5)
WBC: 4.4 10*3/uL (ref 4.0–10.5)
nRBC: 0 % (ref 0.0–0.2)

## 2022-08-15 LAB — BASIC METABOLIC PANEL
Anion gap: 5 (ref 5–15)
BUN: 32 mg/dL — ABNORMAL HIGH (ref 8–23)
CO2: 24 mmol/L (ref 22–32)
Calcium: 8.7 mg/dL — ABNORMAL LOW (ref 8.9–10.3)
Chloride: 108 mmol/L (ref 98–111)
Creatinine, Ser: 2.33 mg/dL — ABNORMAL HIGH (ref 0.61–1.24)
GFR, Estimated: 27 mL/min — ABNORMAL LOW (ref >60)
Glucose, Bld: 104 mg/dL — ABNORMAL HIGH (ref 70–99)
Potassium: 4.3 mmol/L (ref 3.5–5.1)
Sodium: 137 mmol/L (ref 135–145)

## 2022-08-15 LAB — STREP PNEUMONIAE URINARY ANTIGEN: Strep Pneumo Urinary Antigen: NEGATIVE

## 2022-08-15 LAB — TYPE AND SCREEN
ABO/RH(D): O POS
Antibody Screen: NEGATIVE

## 2022-08-15 MED ORDER — HYDROMORPHONE HCL 1 MG/ML IJ SOLN
1.0000 mg | INTRAMUSCULAR | Status: DC | PRN
  Administered 2022-08-15 – 2022-08-17 (×7): 1 mg via INTRAVENOUS
  Filled 2022-08-15 (×7): qty 1

## 2022-08-15 NOTE — Progress Notes (Addendum)
[facility] Cardiology  SUBJECTIVE: Patient laying in bed, denies chest pain or shortness of breath, complains of back pain   Vitals:   08/15/22 0018 08/15/22 0500 08/15/22 0600 08/15/22 0802  BP: (!) 142/67  (!) 160/79 (!) 157/72  Pulse: 70  67 72  Resp: 20  18 16   Temp: 98.4 F (36.9 C)  97.8 F (36.6 C) (!) 97.4 F (36.3 C)  TempSrc: Oral  Oral Oral  SpO2: 100%  99% 100%  Weight:  85.4 kg    Height:         Intake/Output Summary (Last 24 hours) at 08/15/2022 1052 Last data filed at 08/15/2022 0200 Gross per 24 hour  Intake --  Output 250 ml  Net -250 ml      PHYSICAL EXAM  General: Well developed, well nourished, in no acute distress HEENT:  Normocephalic and atramatic Neck:  No JVD.  Lungs: Clear bilaterally to auscultation and percussion. Heart: HRRR . Normal S1 and S2 without gallops or murmurs.  Abdomen: Bowel sounds are positive, abdomen soft and non-tender  Msk:  Back normal, normal gait. Normal strength and tone for age. Extremities: No clubbing, cyanosis or edema.   Neuro: Alert and oriented X 3. Psych:  Good affect, responds appropriately   LABS: Basic Metabolic Panel: Recent Labs    08/14/22 0247 08/15/22 0440  NA 137 137  K 4.6 4.3  CL 103 108  CO2 24 24  GLUCOSE 104* 104*  BUN 31* 32*  CREATININE 2.69* 2.33*  CALCIUM 9.1 8.7*   Liver Function Tests: No results for input(s): "AST", "ALT", "ALKPHOS", "BILITOT", "PROT", "ALBUMIN" in the last 72 hours. No results for input(s): "LIPASE", "AMYLASE" in the last 72 hours. CBC: Recent Labs    08/14/22 0247 08/15/22 0440  WBC 3.8* 4.4  HGB 8.6* 7.8*  HCT 28.6* 25.5*  MCV 92.9 90.7  PLT 136* 149*   Cardiac Enzymes: No results for input(s): "CKTOTAL", "CKMB", "CKMBINDEX", "TROPONINI" in the last 72 hours. BNP: Invalid input(s): "POCBNP" D-Dimer: No results for input(s): "DDIMER" in the last 72 hours. Hemoglobin A1C: No results for input(s): "HGBA1C" in the last 72 hours. Fasting Lipid  Panel: No results for input(s): "CHOL", "HDL", "LDLCALC", "TRIG", "CHOLHDL", "LDLDIRECT" in the last 72 hours. Thyroid Function Tests: No results for input(s): "TSH", "T4TOTAL", "T3FREE", "THYROIDAB" in the last 72 hours.  Invalid input(s): "FREET3" Anemia Panel: No results for input(s): "VITAMINB12", "FOLATE", "FERRITIN", "TIBC", "IRON", "RETICCTPCT" in the last 72 hours.  CT Angio Abd/Pel w/ and/or w/o  Result Date: 08/14/2022 CLINICAL DATA:  History of endovascular repair of an abdominal aortic aneurysm. Evaluate abdominal aortic aneurysm sac. EXAM: CTA ABDOMEN AND PELVIS WITHOUT AND WITH CONTRAST TECHNIQUE: Multidetector CT imaging of the abdomen and pelvis was performed using the standard protocol during bolus administration of intravenous contrast. Multiplanar reconstructed images and MIPs were obtained and reviewed to evaluate the vascular anatomy. RADIATION DOSE REDUCTION: This exam was performed according to the departmental dose-optimization program which includes automated exposure control, adjustment of the mA and/or kV according to patient size and/or use of iterative reconstruction technique. CONTRAST:  51mL OMNIPAQUE IOHEXOL 350 MG/ML SOLN COMPARISON:  CT chest abdomen pelvis 08/14/2022 and PET-CT 09/09/2021 FINDINGS: VASCULAR Aorta: Endovascular repair of an abdominal aortic aneurysm with a bifurcated infrarenal aortic stent graft. Aortic stent graft is patent. The aneurysm sac measures up to 7.6 cm and measured 7.0 cm on 09/09/2021. There is contrast within the posterior proximal aspect of the aneurysm sac likely coming from lumbar arteries.  Large amount of contrast within the sac on the delayed images. 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 No evidence for an aortic sac rupture. Celiac: Patent without evidence of aneurysm, dissection, vasculitis or significant stenosis. Incidentally, there is an accessory left hepatic artery coming off the left gastric artery. SMA:  Approximately 50% stenosis involving the origin and proximal aspect of the SMA related to mixed plaque. SMA is patent. Renals: Both renal arteries are patent without evidence of aneurysm, dissection, vasculitis, fibromuscular dysplasia or significant stenosis. IMA: Retrograde filling of the inferior mesenteric artery and there is contrast near the origin. 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 Inflow: Left limb terminates in the distal left common iliac artery. Left internal and external iliac arteries are patent. There is focal dilatation in the left internal iliac artery measuring up to 1.2 cm. Right limb extends into the right external iliac artery and there has been endovascular embolization of the right internal iliac artery origin. 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 Right external iliac artery is patent. Proximal Outflow: Proximal femoral arteries are patent bilaterally Veins: IVC and renal veins are patent. No gross abnormality to the iliac veins. Review of the MIP images confirms the above findings. NON-VASCULAR Lower chest: Emphysema. 8 mm nodule in the left upper lobe on sequence 7, image 28 is stable since 09/18/2019. No large pleural effusion. Again noted is a cardiac pacemaker with a lead extending through an interatrial defect and appears to be terminating in the left ventricle. The other cardiac lead is in the right atrial appendage. Pacemaker leads appear to be chronic. Post CABG changes. Hepatobiliary: Normal appearance of the liver and gallbladder. Pancreas: Unremarkable. No pancreatic ductal dilatation or surrounding inflammatory changes. Spleen: 9 mm hyper hypodensity in the spleen appears chronic no acute abnormality. Adrenals/Urinary Tract: Normal appearance of the adrenal glands. Both kidneys are atrophic without hydronephrosis. Low-density cyst in left kidney again noted and do not require dedicated  follow-up. 9 mm stone in the left kidney upper pole. Normal appearance of the urinary bladder. Small calcification in the right kidney upper pole region could be vascular in etiology. Stomach/Bowel: Rectum is distended with gas and stool. No evidence for bowel obstruction or focal bowel inflammation. Normal appearance of the stomach. Lymphatic: No significant lymph node enlargement in the abdomen or pelvis. Reproductive: Evidence for prostatectomy. Other: Negative for free fluid.  Negative for free air. Musculoskeletal: There is a intramedullary nail in the right humerus which is incompletely imaged. Right hip arthroplasty is located. Bilateral pedicle screw and rod fixation at L5-S1. Stable anterolisthesis of L5 on S1 with disc space loss at L5-S1. Disc space narrowing at L4-L5. Chronic mild retrolisthesis of L2 on L3. IMPRESSION: VASCULAR 1. 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 The bifurcated aortic stent graft is patent but the aneurysm sac is enlarging. Aneurysm sac measures up to 7.6 cm and measured 7.0 cm on 09/09/2021. 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 2. 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 3. Unusual location for a cardiac lead that appears to be extending through in interatrial defect and terminating in the left ventricle. This is a chronic finding. NON-VASCULAR 1. No acute abnormality in the abdomen or pelvis. 2. Atrophy in both kidneys. 3. Aortic Atherosclerosis (ICD10-I70.0) and Emphysema (ICD10-J43.9). 4. Nonobstructive left nephrolithiasis. Electronically Signed  By: Markus Daft M.D.   On: 08/14/2022 15:29   CT CHEST ABDOMEN PELVIS WO CONTRAST  Result Date: 08/14/2022 CLINICAL DATA:  Pneumonia, complications suspected. Low back pain onset 2 days ago worsening with movement. Shortness of breath. EXAM: CT CHEST, ABDOMEN AND PELVIS WITHOUT CONTRAST TECHNIQUE: Multidetector CT imaging of  the chest, abdomen and pelvis was performed following the standard protocol without IV contrast. RADIATION DOSE REDUCTION: This exam was performed according to the departmental dose-optimization program which includes automated exposure control, adjustment of the mA and/or kV according to patient size and/or use of iterative reconstruction technique. COMPARISON:  PET-CT skull base to pelvis 09/09/2021, CT abdomen pelvis without contrast 03/31/2021. Most recent chest x-ray was portable chest today, portable chest 08/13/2022. FINDINGS: CT CHEST FINDINGS Cardiovascular: There is mild cardiomegaly. Metal artifact from left chest dual lead pacing system and dual lead wires in the heart. There are CABG changes. Aortic tortuosity and moderate patchy arthrosclerosis with aortic root ectasia up to 3.8 cm and ascending aorta with mild dilatation to 4.2 cm. The remainder is within normal caliber limits with scattered calcific plaque in the great vessels. There is a prominent pulmonary trunk 3.4 cm indicating arterial hypertension, unchanged. There are normal caliber pulmonary veins. Mediastinum/Nodes: No intrathoracic or axillary adenopathy. Thyroid gland obscured by metallic artifact from bilateral shoulder replacements and left chest pacemaker. Small amount of retained secretions at the right posterolateral tracheal wall, otherwise unremarkable trachea and main bronchi. Lungs/Pleura: Chronic changes in the bases and moderate emphysematous disease with both paraseptal and centrilobular changes. Right lower lobectomy with volume loss and mild asymmetric elevation of the right diaphragm. There is mild bronchial thickening. No pneumonic infiltrate or nodule is seen. No pleural effusion, thickening or pneumothorax. Musculoskeletal: There is osteopenia, degenerative disc disease and spondylosis of the thoracic spine. As above there are bilateral shoulder replacements. CT ABDOMEN PELVIS FINDINGS Hepatobiliary: The liver is  unremarkable without contrast. The gallbladder and bile ducts are unremarkable. Pancreas: No focal abnormality. Spleen: Chronic subcentimeter low-attenuation lesion in the central spleen. Probable cyst or hemangioma. Stable. Otherwise unremarkable without contrast. Adrenals/Urinary Tract: There is no adrenal mass. There is bilateral renal cortical thinning. Stable left renal cysts and 1 cm nonobstructive caliceal stone in the superior pole. 2 mm stone or renovascular calcification upper pole right kidney. Perinephric stranding is also similar. There is no ureteral stone or hydronephrosis. The right side of the bladder obscured by interval new right hip replacement. The visualized bladder normal in thickness. Stomach/Bowel: Small hiatal hernia. Contracted stomach with normal caliber unopacified small bowel. Normal appendix which is well visible. Mild-to-moderate stool retention in the colon including the rectum but no rectal wall thickening or inflammation. Scattered sigmoid diverticula without diverticulitis. Vascular/Lymphatic: Aortoiliac heavy calcific plaques are again noted with aorto bi-iliac stent graft. A large excluded aneurysm sac is again noted and is larger than previously. On the 2 prior studies, the aneurysmal sac measured 6.9 x 7.0 cm, today measuring 7.6 x 7.6 cm. 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 There are endovascular coils in the region of the right internal iliac artery which were noted previously. Reproductive: Old prostatectomy. Other: There are tiny umbilical and inguinal fat hernias. There is no incarcerated hernia. There is no free hemorrhage, free fluid or free air, or inflammatory stranding around the AAA. Multiple pelvic phleboliths. Musculoskeletal: Chronic L5-S1 fusion hardware is again noted with chronic L5 spondylolysis and grade 2 L5-S1 spondylolisthesis, unchanged. There  is osteopenia degenerative change of the  lumbar spine with discogenic mild grade 1 retrolisthesis unchanged at L1-2 and L2-3, slight dextroscoliosis. Interval right hip replacement. No acute hardware complications. No operative site hematoma. Ankylosis left SI joint. IMPRESSION: 1. COPD and mild chronic bronchitis with prior right lower lobectomy. No focal pneumonia is seen. Chronic prominence of the pulmonary trunk. 2. Aortic and coronary artery atherosclerosis, prior CABG, pacemaker, and abdominal aortobi-iliac stent grafting. 3. 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 Annual CTA or MRA follow-up recommended improved 4. 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 CTA would be the preferred imaging workup, MRA if the patient cannot have IV contrast. 5. Constipation and diverticulosis. 6. Nonobstructive nephrolithiasis. 7. Degenerative and postsurgical changes of the spine and osteopenia. No acute spinal compression fracture. Electronically Signed   By: Telford Nab M.D.   On: 08/14/2022 05:23   NM Pulmonary Perfusion  Result Date: 08/14/2022 CLINICAL DATA:  This study identified as missing a report at 4:54 am on 08/14/2022. 81 year old male with shortness of breath for 2 months. EXAM: NUCLEAR MEDICINE PERFUSION LUNG SCAN TECHNIQUE: Perfusion images were obtained in multiple projections after intravenous injection of radiopharmaceutical. Ventilation scans intentionally deferred if perfusion scan and chest x-ray adequate for interpretation during COVID 19 epidemic. RADIOPHARMACEUTICALS:  4.4 mCi Tc-46m MAA IV COMPARISON:  PA and lateral chest radiographs 08/13/2022. Subsequent CT Chest, Abdomen, and Pelvis 08/14/2022 at 0411 hours. FINDINGS: Fairly homogeneous lung perfusion radiotracer activity when accounting for chronic right lung pleural scarring and costophrenic angle blunting demonstrated radiographically, and centrilobular and paraseptal emphysema demonstrated by CT this  morning. No suspicious perfusion defect. IMPRESSION: Chronic lung disease. No scintigraphic evidence of pulmonary embolus. Electronically Signed   By: Genevie Ann M.D.   On: 08/14/2022 04:58   CT Head Wo Contrast  Result Date: 08/14/2022 CLINICAL DATA:  81 year old male with altered mental status. Increasing back pain with movement. EXAM: CT HEAD WITHOUT CONTRAST TECHNIQUE: Contiguous axial images were obtained from the base of the skull through the vertex without intravenous contrast. RADIATION DOSE REDUCTION: This exam was performed according to the departmental dose-optimization program which includes automated exposure control, adjustment of the mA and/or kV according to patient size and/or use of iterative reconstruction technique. COMPARISON:  Brain MRI 01/14/2010.  Head CT 06/10/2022. 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 Small chronic bilateral cerebellar infarcts. Stable cerebral volume. Patchy encephalomalacia and dystrophic calcification in the right parietal lobe is stable. Small area of chronic encephalomalacia in the right inferior frontal gyrus is stable. No midline shift, ventriculomegaly, mass effect, evidence of mass lesion, intracranial hemorrhage or evidence of cortically based acute infarction. Vascular: Calcified atherosclerosis at the skull base. No suspicious intracranial vascular hyperdensity. Skull: No acute osseous abnormality identified. Sinuses/Orbits: Mild sinus opacification in the left frontal, frontoethmoidal recess and sphenoid sinus have not significantly changed from last month. And overall paranasal sinuses and mastoids are well aerated. Other: No acute orbit or scalp soft tissue finding. IMPRESSION: 1. No acute intracranial abnormality identified. 2. Advanced chronic ischemic disease appears stable by CT since last month. Electronically Signed   By: Genevie Ann M.D.   On: 08/14/2022 04:54   DG Chest  Portable 1 View  Result Date: 08/14/2022 CLINICAL DATA:  Shortness of breath, low back pain EXAM: PORTABLE CHEST 1 VIEW COMPARISON:  08/13/2022 FINDINGS: Mild patchy right lower lobe opacity, new, suspicious  for pneumonia. Trace right pleural effusion versus chronic pleural thickening. Left lung is clear. No pneumothorax. Mild cardiomegaly. Postsurgical changes related to prior CABG. Left subclavian pacemaker. Median sternotomy. IMPRESSION: Mild patchy right lower lobe opacity, new, suspicious for pneumonia. Electronically Signed   By: Julian Hy M.D.   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  Preoperative cardiovascular assessment.  Based on patients age and comorbidities, the patient poses at least mild to moderate risk for cardiovascular complication during surgical repair of abdominal aortic aneurysm.  The patient appears to be medically optimized.  Patient denies chest pain.  We will chamber pacemaker appears to be functioning appropriately.  High-sensitivity troponin borderline elevated in the absence of chest pain, most likely elevated due to underlying chronic kidney disease. 2.  Known CAD, CABG x3, currently without  chest pain 3.  Abdominal aortic aneurysm, pending surgical repair 4.  Status post dual-chamber pacemaker for second-degree AV block 5.  Chronic kidney disease, stage IIIb 6.  Severe back pain, uncertain etiology 7.  Faecalis bacteremia, 2D echocardiogram unremarkable, ID recommends TEE to rule out pacer lead vegetation/infection   Recommendations   1.  Agree with current therapy 2.  Transthoracic echocardiogram 3.  Consider TEE after further discussion with patient with how aggressive he wants to be with further work-up, possible lead extraction, abdominal aortic aneurysm surgical repair 4.  Await further recommendations per vascular surgery   Isaias Cowman, MD, PhD, [facility] 08/15/2022 10:52 AM

## 2022-08-15 NOTE — Progress Notes (Signed)
Subjective/Chief Complaint: Complains of continued back pain. Denies abdominal pain.   Objective: Vital signs in last 24 hours: Temp:  [97.4 F (36.3 C)-98.4 F (36.9 C)] 97.4 F (36.3 C) (10/08 0802) Pulse Rate:  [59-78] 72 (10/08 0802) Resp:  [14-20] 16 (10/08 0802) BP: (114-160)/(55-79) 157/72 (10/08 0802) SpO2:  [92 %-100 %] 100 % (10/08 0802) Weight:  [85.4 kg] 85.4 kg (10/08 0500)    Intake/Output from previous day: 10/07 0701 - 10/08 0700 In: -  Out: 250 [Urine:250] Intake/Output this shift: No intake/output data recorded.  General appearance: alert and mild distress Resp: clear to auscultation bilaterally Cardio: regular rate and rhythm GI: soft, non-tender; bowel sounds normal; no masses,  no organomegaly Pulses: 2+ and symmetric Palpable femoral bilateral  Lab Results:  Recent Labs    08/14/22 0247 08/15/22 0440  WBC 3.8* 4.4  HGB 8.6* 7.8*  HCT 28.6* 25.5*  PLT 136* 149*   BMET Recent Labs    08/14/22 0247 08/15/22 0440  NA 137 137  K 4.6 4.3  CL 103 108  CO2 24 24  GLUCOSE 104* 104*  BUN 31* 32*  CREATININE 2.69* 2.33*  CALCIUM 9.1 8.7*   PT/INR Recent Labs    08/14/22 1958  LABPROT 14.5  INR 1.1   ABG No results for input(s): "PHART", "HCO3" in the last 72 hours.  Invalid input(s): "PCO2", "PO2"  Studies/Results: CT Angio Abd/Pel w/ and/or w/o  Result Date: 08/14/2022 CLINICAL DATA:  History of endovascular repair of an abdominal aortic aneurysm. Evaluate abdominal aortic aneurysm sac. EXAM: CTA ABDOMEN AND PELVIS WITHOUT AND WITH CONTRAST TECHNIQUE: Multidetector CT imaging of the abdomen and pelvis was performed using the standard protocol during bolus administration of intravenous contrast. Multiplanar reconstructed images and MIPs were obtained and reviewed to evaluate the vascular anatomy. RADIATION DOSE REDUCTION: This exam was performed according to the departmental dose-optimization program which includes automated  exposure control, adjustment of the mA and/or kV according to patient size and/or use of iterative reconstruction technique. CONTRAST:  71mL OMNIPAQUE IOHEXOL 350 MG/ML SOLN COMPARISON:  CT chest abdomen pelvis 08/14/2022 and PET-CT 09/09/2021 FINDINGS: VASCULAR Aorta: Endovascular repair of an abdominal aortic aneurysm with Mcbride bifurcated infrarenal aortic stent graft. Aortic stent graft is patent. The aneurysm sac measures up to 7.6 cm and measured 7.0 cm on 09/09/2021. There is contrast within the posterior proximal aspect of the aneurysm sac likely coming from lumbar arteries. Large amount of contrast within the sac on the delayed images. 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 No evidence for an aortic sac rupture. Celiac: Patent without evidence of aneurysm, dissection, vasculitis or significant stenosis. Incidentally, there is an accessory left hepatic artery coming off the left gastric artery. SMA: Approximately 50% stenosis involving the origin and proximal aspect of the SMA related to mixed plaque. SMA is patent. Renals: Both renal arteries are patent without evidence of aneurysm, dissection, vasculitis, fibromuscular dysplasia or significant stenosis. IMA: Retrograde filling of the inferior mesenteric artery and there is contrast near the origin. 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 Inflow: Left limb terminates in the distal left common iliac artery. Left internal and external iliac arteries are patent. There is focal dilatation in the left internal iliac artery measuring up to 1.2 cm. Right limb extends into the right external iliac artery and there has been endovascular embolization of the right internal iliac artery origin. Excluded right common iliac artery aneurysm sac measures 3.1 cm and similar  to the exam from 09/09/2021. Right external iliac artery is patent. Proximal Outflow: Proximal femoral arteries are patent bilaterally Veins: IVC and renal veins are  patent. No gross abnormality to the iliac veins. Review of the MIP images confirms the above findings. NON-VASCULAR Lower chest: Emphysema. 8 mm nodule in the left upper lobe on sequence 7, image 28 is stable since 09/18/2019. No large pleural effusion. Again noted is Mcbride cardiac pacemaker with Mcbride lead extending through an interatrial defect and appears to be terminating in the left ventricle. The other cardiac lead is in the right atrial appendage. Pacemaker leads appear to be chronic. Post CABG changes. Hepatobiliary: Normal appearance of the liver and gallbladder. Pancreas: Unremarkable. No pancreatic ductal dilatation or surrounding inflammatory changes. Spleen: 9 mm hyper hypodensity in the spleen appears chronic no acute abnormality. Adrenals/Urinary Tract: Normal appearance of the adrenal glands. Both kidneys are atrophic without hydronephrosis. Low-density cyst in left kidney again noted and do not require dedicated follow-up. 9 mm stone in the left kidney upper pole. Normal appearance of the urinary bladder. Small calcification in the right kidney upper pole region could be vascular in etiology. Stomach/Bowel: Rectum is distended with gas and stool. No evidence for bowel obstruction or focal bowel inflammation. Normal appearance of the stomach. Lymphatic: No significant lymph node enlargement in the abdomen or pelvis. Reproductive: Evidence for prostatectomy. Other: Negative for free fluid.  Negative for free air. Musculoskeletal: There is Mcbride intramedullary nail in the right humerus which is incompletely imaged. Right hip arthroplasty is located. Bilateral pedicle screw and rod fixation at L5-S1. Stable anterolisthesis of L5 on S1 with disc space loss at L5-S1. Disc space narrowing at L4-L5. Chronic mild retrolisthesis of L2 on L3. IMPRESSION: VASCULAR 1. 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 The bifurcated aortic stent graft is patent but the aneurysm sac is enlarging. Aneurysm sac measures up  to 7.6 cm and measured 7.0 cm on 09/09/2021. Evidence for Mcbride type 2 endoleak which appears to be associated with lumbar arteries and suspect involvement of the IMA. 2. 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 3. Unusual location for Mcbride cardiac lead that appears to be extending through in interatrial defect and terminating in the left ventricle. This is Mcbride chronic finding. NON-VASCULAR 1. No acute abnormality in the abdomen or pelvis. 2. Atrophy in both kidneys. 3. Aortic Atherosclerosis (ICD10-I70.0) and Emphysema (ICD10-J43.9). 4. Nonobstructive left nephrolithiasis. Electronically Signed   By: Markus Daft M.D.   On: 08/14/2022 15:29   CT CHEST ABDOMEN PELVIS WO CONTRAST  Result Date: 08/14/2022 CLINICAL DATA:  Pneumonia, complications suspected. Low back pain onset 2 days ago worsening with movement. Shortness of breath. EXAM: CT CHEST, ABDOMEN AND PELVIS WITHOUT CONTRAST TECHNIQUE: Multidetector CT imaging of the chest, abdomen and pelvis was performed following the standard protocol without IV contrast. RADIATION DOSE REDUCTION: This exam was performed according to the departmental dose-optimization program which includes automated exposure control, adjustment of the mA and/or kV according to patient size and/or use of iterative reconstruction technique. COMPARISON:  PET-CT skull base to pelvis 09/09/2021, CT abdomen pelvis without contrast 03/31/2021. Most recent chest x-ray was portable chest today, portable chest 08/13/2022. FINDINGS: CT CHEST FINDINGS Cardiovascular: There is mild cardiomegaly. Metal artifact from left chest dual lead pacing system and dual lead wires in the heart. There are CABG changes. Aortic tortuosity and moderate patchy arthrosclerosis with aortic root ectasia up to 3.8 cm and ascending aorta with mild dilatation to 4.2 cm. The remainder is within  normal caliber limits with scattered calcific plaque in the great vessels. There is Mcbride prominent pulmonary trunk 3.4 cm indicating  arterial hypertension, unchanged. There are normal caliber pulmonary veins. Mediastinum/Nodes: No intrathoracic or axillary adenopathy. Thyroid gland obscured by metallic artifact from bilateral shoulder replacements and left chest pacemaker. Small amount of retained secretions at the right posterolateral tracheal wall, otherwise unremarkable trachea and main bronchi. Lungs/Pleura: Chronic changes in the bases and moderate emphysematous disease with both paraseptal and centrilobular changes. Right lower lobectomy with volume loss and mild asymmetric elevation of the right diaphragm. There is mild bronchial thickening. No pneumonic infiltrate or nodule is seen. No pleural effusion, thickening or pneumothorax. Musculoskeletal: There is osteopenia, degenerative disc disease and spondylosis of the thoracic spine. As above there are bilateral shoulder replacements. CT ABDOMEN PELVIS FINDINGS Hepatobiliary: The liver is unremarkable without contrast. The gallbladder and bile ducts are unremarkable. Pancreas: No focal abnormality. Spleen: Chronic subcentimeter low-attenuation lesion in the central spleen. Probable cyst or hemangioma. Stable. Otherwise unremarkable without contrast. Adrenals/Urinary Tract: There is no adrenal mass. There is bilateral renal cortical thinning. Stable left renal cysts and 1 cm nonobstructive caliceal stone in the superior pole. 2 mm stone or renovascular calcification upper pole right kidney. Perinephric stranding is also similar. There is no ureteral stone or hydronephrosis. The right side of the bladder obscured by interval new right hip replacement. The visualized bladder normal in thickness. Stomach/Bowel: Small hiatal hernia. Contracted stomach with normal caliber unopacified small bowel. Normal appendix which is well visible. Mild-to-moderate stool retention in the colon including the rectum but no rectal wall thickening or inflammation. Scattered sigmoid diverticula without  diverticulitis. Vascular/Lymphatic: Aortoiliac heavy calcific plaques are again noted with aorto bi-iliac stent graft. Mcbride large excluded aneurysm sac is again noted and is larger than previously. On the 2 prior studies, the aneurysmal sac measured 6.9 x 7.0 cm, today measuring 7.6 x 7.6 cm. 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 There are endovascular coils in the region of the right internal iliac artery which were noted previously. Reproductive: Old prostatectomy. Other: There are tiny umbilical and inguinal fat hernias. There is no incarcerated hernia. There is no free hemorrhage, free fluid or free air, or inflammatory stranding around the AAA. Multiple pelvic phleboliths. Musculoskeletal: Chronic L5-S1 fusion hardware is again noted with chronic L5 spondylolysis and grade 2 L5-S1 spondylolisthesis, unchanged. There is osteopenia degenerative change of the lumbar spine with discogenic mild grade 1 retrolisthesis unchanged at L1-2 and L2-3, slight dextroscoliosis. Interval right hip replacement. No acute hardware complications. No operative site hematoma. Ankylosis left SI joint. IMPRESSION: 1. COPD and mild chronic bronchitis with prior right lower lobectomy. No focal pneumonia is seen. Chronic prominence of the pulmonary trunk. 2. Aortic and coronary artery atherosclerosis, prior CABG, pacemaker, and abdominal aortobi-iliac stent grafting. 3. 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 Annual CTA or MRA follow-up recommended improved 4. 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 CTA would be the preferred imaging workup, MRA if the patient cannot have IV contrast. 5. Constipation and diverticulosis. 6. Nonobstructive nephrolithiasis. 7. Degenerative and postsurgical changes of the spine and osteopenia. No acute spinal compression fracture. Electronically Signed   By: Telford Nab  M.D.   On: 08/14/2022 05:23   NM Pulmonary Perfusion  Result Date: 08/14/2022 CLINICAL DATA:  This study identified as missing Mcbride report at 4:54 am  on 08/14/2022. 81 year old male with shortness of breath for 2 months. EXAM: NUCLEAR MEDICINE PERFUSION LUNG SCAN TECHNIQUE: Perfusion images were obtained in multiple projections after intravenous injection of radiopharmaceutical. Ventilation scans intentionally deferred if perfusion scan and chest x-ray adequate for interpretation during COVID 19 epidemic. RADIOPHARMACEUTICALS:  4.4 mCi Tc-79m MAA IV COMPARISON:  PA and lateral chest radiographs 08/13/2022. Subsequent CT Chest, Abdomen, and Pelvis 08/14/2022 at 0411 hours. FINDINGS: Fairly homogeneous lung perfusion radiotracer activity when accounting for chronic right lung pleural scarring and costophrenic angle blunting demonstrated radiographically, and centrilobular and paraseptal emphysema demonstrated by CT this morning. No suspicious perfusion defect. IMPRESSION: Chronic lung disease. No scintigraphic evidence of pulmonary embolus. Electronically Signed   By: Genevie Ann M.D.   On: 08/14/2022 04:58   CT Head Wo Contrast  Result Date: 08/14/2022 CLINICAL DATA:  81 year old male with altered mental status. Increasing back pain with movement. EXAM: CT HEAD WITHOUT CONTRAST TECHNIQUE: Contiguous axial images were obtained from the base of the skull through the vertex without intravenous contrast. RADIATION DOSE REDUCTION: This exam was performed according to the departmental dose-optimization program which includes automated exposure control, adjustment of the mA and/or kV according to patient size and/or use of iterative reconstruction technique. COMPARISON:  Brain MRI 01/14/2010.  Head CT 06/10/2022. 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 Small chronic bilateral cerebellar infarcts. Stable cerebral volume. Patchy encephalomalacia and  dystrophic calcification in the right parietal lobe is stable. Small area of chronic encephalomalacia in the right inferior frontal gyrus is stable. No midline shift, ventriculomegaly, mass effect, evidence of mass lesion, intracranial hemorrhage or evidence of cortically based acute infarction. Vascular: Calcified atherosclerosis at the skull base. No suspicious intracranial vascular hyperdensity. Skull: No acute osseous abnormality identified. Sinuses/Orbits: Mild sinus opacification in the left frontal, frontoethmoidal recess and sphenoid sinus have not significantly changed from last month. And overall paranasal sinuses and mastoids are well aerated. Other: No acute orbit or scalp soft tissue finding. IMPRESSION: 1. No acute intracranial abnormality identified. 2. Advanced chronic ischemic disease appears stable by CT since last month. Electronically Signed   By: Genevie Ann M.D.   On: 08/14/2022 04:54   DG Chest Portable 1 View  Result Date: 08/14/2022 CLINICAL DATA:  Shortness of breath, low back pain EXAM: PORTABLE CHEST 1 VIEW COMPARISON:  08/13/2022 FINDINGS: Mild patchy right lower lobe opacity, new, suspicious for pneumonia. Trace right pleural effusion versus chronic pleural thickening. Left lung is clear. No pneumothorax. Mild cardiomegaly. Postsurgical changes related to prior CABG. Left subclavian pacemaker. Median sternotomy. IMPRESSION: Mild patchy right lower lobe opacity, new, suspicious for pneumonia. Electronically Signed   By: Julian Hy M.D.   On: 08/14/2022 03:17    Anti-infectives: Anti-infectives (From admission, onward)    Start     Dose/Rate Route Frequency Ordered Stop   08/15/22 1000  azithromycin (ZITHROMAX) tablet 250 mg       See Hyperspace for full Linked Orders Report.   250 mg Oral Daily 08/14/22 0822 08/19/22 0959   08/14/22 2200  ampicillin (OMNIPEN) 2 g in sodium chloride 0.9 % 100 mL IVPB        2 g 300 mL/hr over 20 Minutes Intravenous Every 8 hours 08/14/22  2037     08/14/22 1000  azithromycin (ZITHROMAX) tablet 500 mg       See Hyperspace for full Linked Orders Report.   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 Therefore, preferable to intervene sooner than later ABX per ID Cardiac- TEE Consider Ortho spine consult  NPO after MN Discussed plan with Patient/ wife- all questions answered, understanding expressed regarding risks, benefits and alternatives.  LOS: 1 day    Rick Mcbride 08/15/2022

## 2022-08-15 NOTE — Progress Notes (Addendum)
[place], [place] 08/15/22  Subjective:   Hospital day # 1  Patient known to our practice from outpatient follow-up of CKD, with Dr Candiss Norse.  Patient laying in bed, appears tense States he feels worse today, lower back pain remains Afebrile Remains on room air    10/07 0701 - 10/08 0700 In: -  Out: 250 [Urine:250] Lab Results  Component Value Date   CREATININE 2.33 (H) 08/15/2022   CREATININE 2.69 (H) 08/14/2022   CREATININE 2.39 (H) 06/15/2022     Objective:  Vital signs in last 24 hours:  Temp:  [97.4 F (36.3 C)-98.4 F (36.9 C)] 98 F (36.7 C) (10/08 1158) Pulse Rate:  [59-78] 76 (10/08 1158) Resp:  [14-20] 18 (10/08 1158) BP: (114-160)/(55-80) 139/80 (10/08 1158) SpO2:  [92 %-100 %] 96 % (10/08 1158) Weight:  [85.4 kg] 85.4 kg (10/08 0500)  Weight change: -14.4 kg Filed Weights   08/14/22 0232 08/15/22 0500  Weight: 99.8 kg 85.4 kg    Intake/Output:    Intake/Output Summary (Last 24 hours) at 08/15/2022 1159 Last data filed at 08/15/2022 1153 Gross per 24 hour  Intake --  Output 700 ml  Net -700 ml      Physical Exam: General: Frail, elderly gentleman, laying in the bed  HEENT Anicteric, moist oral mucous membranes  Pulm/lungs Normal breathing effort, clear to auscultation  CVS/Heart No rub or gallop  Abdomen:  Soft, nontender  Extremities: No peripheral edema  Neurologic: Alert, oriented, pain with movement of right leg  Skin: No acute rashes          Basic Metabolic Panel:  Recent Labs  Lab 08/14/22 0247 08/15/22 0440  NA 137 137  K 4.6 4.3  CL 103 108  CO2 24 24  GLUCOSE 104* 104*  BUN 31* 32*  CREATININE 2.69* 2.33*  CALCIUM 9.1 8.7*      CBC: Recent Labs  Lab 08/14/22 0247 08/15/22 0440  WBC 3.8* 4.4  HGB 8.6* 7.8*  HCT 28.6* 25.5*  MCV 92.9 90.7  PLT 136* 149*      No results found for: "HEPBSAG", "HEPBSAB", "HEPBIGM"    Microbiology:  Recent Results (from the past 240  hour(s))  Culture, blood (routine x 2)     Status: None (Preliminary result)   Collection Time: 08/14/22  3:57 AM   Specimen: BLOOD  Result Value Ref Range Status   Specimen Description BLOOD BLOOD RIGHT ARM  Final   Special Requests   Final    BOTTLES DRAWN AEROBIC AND ANAEROBIC Blood Culture adequate volume   Culture  Setup Time   Final    Organism ID to follow Cash AND ANAEROBIC BOTTLES CRITICAL RESULT CALLED TO, READ BACK BY AND VERIFIED WITH: PHARMD CHILDS AT 1957 08/14/2022 GAA Performed at [facility], [address] [place],  [address]    Culture GRAM POSITIVE COCCI  Final   Report Status PENDING  Incomplete  Culture, blood (routine x 2)     Status: None (Preliminary result)   Collection Time: 08/14/22  3:57 AM   Specimen: BLOOD  Result Value Ref Range Status   Specimen Description BLOOD BLOOD LEFT ARM  Final   Special Requests   Final    BOTTLES DRAWN AEROBIC AND ANAEROBIC Blood Culture adequate volume   Culture  Setup Time   Final    GRAM POSITIVE COCCI AEROBIC BOTTLE ONLY CRITICAL RESULT CALLED TO, READ BACK BY AND VERIFIED WITH: PHARMD CHILDS  AT 1957 08/14/2022 GAA GRAM STAIN REVIEWED-AGREE WITH RESULT Performed at [facility], [address] [place], [place] [address]    Culture [facility] POSITIVE COCCI  Final   Report Status PENDING  Incomplete  Resp Panel by RT-PCR (Flu A&B, Covid) Anterior Nasal Swab     Status: None   Collection Time: 08/14/22  3:57 AM   Specimen: Anterior Nasal Swab  Result Value Ref Range Status   SARS Coronavirus 2 by RT PCR NEGATIVE NEGATIVE Final    Comment: (NOTE) SARS-CoV-2 target nucleic acids are NOT DETECTED.  The SARS-CoV-2 RNA is generally detectable in upper respiratory specimens during the acute phase of infection. The lowest concentration of SARS-CoV-2 viral copies this assay can detect is 138 copies/mL. A negative result does not preclude SARS-Cov-2 infection and  should not be used as the sole basis for treatment or other patient management decisions. A negative result may occur with  improper specimen collection/handling, submission of specimen other than nasopharyngeal swab, presence of viral mutation(s) within the areas targeted by this assay, and inadequate number of viral copies(<138 copies/mL). A negative result must be combined with clinical observations, patient history, and epidemiological information. The expected result is Negative.  Fact Sheet for Patients:  EntrepreneurPulse.com.au  Fact Sheet for Healthcare Providers:  IncredibleEmployment.be  This test is no t yet approved or cleared by the [place] FDA and  has been authorized for detection and/or diagnosis of SARS-CoV-2 by FDA under an Emergency Use Authorization (EUA). This EUA will remain  in effect (meaning this test can be used) for the duration of the COVID-19 declaration under Section 564(b)(1) of the Act, 21 U.S.C.section 360bbb-3(b)(1), unless the authorization is terminated  or revoked sooner.       Influenza A by PCR NEGATIVE NEGATIVE Final   Influenza B by PCR NEGATIVE NEGATIVE Final    Comment: (NOTE) The Xpert Xpress SARS-CoV-2/FLU/RSV plus assay is intended as an aid in the diagnosis of influenza from Nasopharyngeal swab specimens and should not be used as a sole basis for treatment. Nasal washings and aspirates are unacceptable for Xpert Xpress SARS-CoV-2/FLU/RSV testing.  Fact Sheet for Patients: EntrepreneurPulse.com.au  Fact Sheet for Healthcare Providers: IncredibleEmployment.be  This test is not yet approved or cleared by the [place] FDA and has been authorized for detection and/or diagnosis of SARS-CoV-2 by FDA under an Emergency Use Authorization (EUA). This EUA will remain in effect (meaning this test can be used) for the duration of the COVID-19 declaration  under Section 564(b)(1) of the Act, 21 U.S.C. section 360bbb-3(b)(1), unless the authorization is terminated or revoked.  Performed at [facility], [address] [place], [place] [address]   Blood Culture ID Panel (Reflexed)     Status: Abnormal   Collection Time: 08/14/22  3:57 AM  Result Value Ref Range Status   Enterococcus faecalis DETECTED (A) NOT DETECTED Final    Comment: CRITICAL RESULT CALLED TO, READ BACK BY AND VERIFIED WITH: PHARMD CHILDS AT 1957 08/14/2022 GAA    Enterococcus Faecium NOT DETECTED NOT DETECTED Final   Listeria monocytogenes NOT DETECTED NOT DETECTED Final   Staphylococcus species NOT DETECTED NOT DETECTED Final   Staphylococcus aureus (BCID) NOT DETECTED NOT DETECTED Final   Staphylococcus epidermidis NOT DETECTED NOT DETECTED Final   Staphylococcus lugdunensis NOT DETECTED NOT DETECTED Final   Streptococcus species NOT DETECTED NOT DETECTED Final   Streptococcus agalactiae NOT DETECTED NOT DETECTED Final   Streptococcus pneumoniae NOT DETECTED NOT DETECTED Final   Streptococcus  pyogenes NOT DETECTED NOT DETECTED Final   A.calcoaceticus-baumannii NOT DETECTED NOT DETECTED Final   Bacteroides fragilis NOT DETECTED NOT DETECTED Final   Enterobacterales NOT DETECTED NOT DETECTED Final   Enterobacter cloacae complex NOT DETECTED NOT DETECTED Final   Escherichia coli NOT DETECTED NOT DETECTED Final   Klebsiella aerogenes NOT DETECTED NOT DETECTED Final   Klebsiella oxytoca NOT DETECTED NOT DETECTED Final   Klebsiella pneumoniae NOT DETECTED NOT DETECTED Final   Proteus species NOT DETECTED NOT DETECTED Final   Salmonella species NOT DETECTED NOT DETECTED Final   Serratia marcescens NOT DETECTED NOT DETECTED Final   Haemophilus influenzae NOT DETECTED NOT DETECTED Final   Neisseria meningitidis NOT DETECTED NOT DETECTED Final   Pseudomonas aeruginosa NOT DETECTED NOT DETECTED Final   Stenotrophomonas maltophilia NOT DETECTED NOT DETECTED Final    Candida albicans NOT DETECTED NOT DETECTED Final   Candida auris NOT DETECTED NOT DETECTED Final   Candida glabrata NOT DETECTED NOT DETECTED Final   Candida krusei NOT DETECTED NOT DETECTED Final   Candida parapsilosis NOT DETECTED NOT DETECTED Final   Candida tropicalis NOT DETECTED NOT DETECTED Final   Cryptococcus neoformans/gattii NOT DETECTED NOT DETECTED Final   Vancomycin resistance NOT DETECTED NOT DETECTED Final    Comment: Performed at [facility], [address] [place], [place] [address]    Coagulation Studies: Recent Labs    08/14/22 1958  LABPROT 14.5  INR 1.1    Urinalysis: Recent Labs    08/14/22 0654  COLORURINE YELLOW*  LABSPEC 1.017  PHURINE 6.0  GLUCOSEU NEGATIVE  HGBUR NEGATIVE  BILIRUBINUR NEGATIVE  KETONESUR NEGATIVE  PROTEINUR 30*  NITRITE NEGATIVE  LEUKOCYTESUR NEGATIVE       Imaging: CT Angio Abd/Pel w/ and/or w/o  Result Date: 08/14/2022 CLINICAL DATA:  History of endovascular repair of an abdominal aortic aneurysm. Evaluate abdominal aortic aneurysm sac. EXAM: CTA ABDOMEN AND PELVIS WITHOUT AND WITH CONTRAST TECHNIQUE: Multidetector CT imaging of the abdomen and pelvis was performed using the standard protocol during bolus administration of intravenous contrast. Multiplanar reconstructed images and MIPs were obtained and reviewed to evaluate the vascular anatomy. RADIATION DOSE REDUCTION: This exam was performed according to the departmental dose-optimization program which includes automated exposure control, adjustment of the mA and/or kV according to patient size and/or use of iterative reconstruction technique. CONTRAST:  29mL OMNIPAQUE IOHEXOL 350 MG/ML SOLN COMPARISON:  CT chest abdomen pelvis 08/14/2022 and PET-CT 09/09/2021 FINDINGS: VASCULAR Aorta: Endovascular repair of an abdominal aortic aneurysm with a bifurcated infrarenal aortic stent graft. Aortic stent graft is patent. The aneurysm sac measures up to 7.6 cm and  measured 7.0 cm on 09/09/2021. There is contrast within the posterior proximal aspect of the aneurysm sac likely coming from lumbar arteries. Large amount of contrast within the sac on the delayed images. 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 No evidence for an aortic sac rupture. Celiac: Patent without evidence of aneurysm, dissection, vasculitis or significant stenosis. Incidentally, there is an accessory left hepatic artery coming off the left gastric artery. SMA: Approximately 50% stenosis involving the origin and proximal aspect of the SMA related to mixed plaque. SMA is patent. Renals: Both renal arteries are patent without evidence of aneurysm, dissection, vasculitis, fibromuscular dysplasia or significant stenosis. IMA: Retrograde filling of the inferior mesenteric artery and there is contrast near the origin. 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 Inflow: Left limb terminates in the distal left common iliac artery. Left internal  and external iliac arteries are patent. There is focal dilatation in the left internal iliac artery measuring up to 1.2 cm. Right limb extends into the right external iliac artery and there has been endovascular embolization of the right internal iliac artery origin. 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 Right external iliac artery is patent. Proximal Outflow: Proximal femoral arteries are patent bilaterally Veins: IVC and renal veins are patent. No gross abnormality to the iliac veins. Review of the MIP images confirms the above findings. NON-VASCULAR Lower chest: Emphysema. 8 mm nodule in the left upper lobe on sequence 7, image 28 is stable since 09/18/2019. No large pleural effusion. Again noted is a cardiac pacemaker with a lead extending through an interatrial defect and appears to be terminating in the left ventricle. The other cardiac lead is in the right atrial appendage.  Pacemaker leads appear to be chronic. Post CABG changes. Hepatobiliary: Normal appearance of the liver and gallbladder. Pancreas: Unremarkable. No pancreatic ductal dilatation or surrounding inflammatory changes. Spleen: 9 mm hyper hypodensity in the spleen appears chronic no acute abnormality. Adrenals/Urinary Tract: Normal appearance of the adrenal glands. Both kidneys are atrophic without hydronephrosis. Low-density cyst in left kidney again noted and do not require dedicated follow-up. 9 mm stone in the left kidney upper pole. Normal appearance of the urinary bladder. Small calcification in the right kidney upper pole region could be vascular in etiology. Stomach/Bowel: Rectum is distended with gas and stool. No evidence for bowel obstruction or focal bowel inflammation. Normal appearance of the stomach. Lymphatic: No significant lymph node enlargement in the abdomen or pelvis. Reproductive: Evidence for prostatectomy. Other: Negative for free fluid.  Negative for free air. Musculoskeletal: There is a intramedullary nail in the right humerus which is incompletely imaged. Right hip arthroplasty is located. Bilateral pedicle screw and rod fixation at L5-S1. Stable anterolisthesis of L5 on S1 with disc space loss at L5-S1. Disc space narrowing at L4-L5. Chronic mild retrolisthesis of L2 on L3. IMPRESSION: VASCULAR 1. 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 The bifurcated aortic stent graft is patent but the aneurysm sac is enlarging. Aneurysm sac measures up to 7.6 cm and measured 7.0 cm on 09/09/2021. 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 2. 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 3. Unusual location for a cardiac lead that appears to be extending through in interatrial defect and terminating in the left ventricle. This is a chronic finding. NON-VASCULAR 1. No acute abnormality in the abdomen or pelvis. 2.  Atrophy in both kidneys. 3. Aortic Atherosclerosis (ICD10-I70.0) and Emphysema (ICD10-J43.9). 4. Nonobstructive left nephrolithiasis. Electronically Signed   By: Markus Daft M.D.   On: 08/14/2022 15:29   CT CHEST ABDOMEN PELVIS WO CONTRAST  Result Date: 08/14/2022 CLINICAL DATA:  Pneumonia, complications suspected. Low back pain onset 2 days ago worsening with movement. Shortness of breath. EXAM: CT CHEST, ABDOMEN AND PELVIS WITHOUT CONTRAST TECHNIQUE: Multidetector CT imaging of the chest, abdomen and pelvis was performed following the standard protocol without IV contrast. RADIATION DOSE REDUCTION: This exam was performed according to the departmental dose-optimization program which includes automated exposure control, adjustment of the mA and/or kV according to patient size and/or use of iterative reconstruction technique. COMPARISON:  PET-CT skull base to pelvis 09/09/2021, CT abdomen pelvis without contrast 03/31/2021. Most recent chest x-ray was portable chest today, portable chest 08/13/2022. FINDINGS:  CT CHEST FINDINGS Cardiovascular: There is mild cardiomegaly. Metal artifact from left chest dual lead pacing system and dual lead wires in the heart. There are CABG changes. Aortic tortuosity and moderate patchy arthrosclerosis with aortic root ectasia up to 3.8 cm and ascending aorta with mild dilatation to 4.2 cm. The remainder is within normal caliber limits with scattered calcific plaque in the great vessels. There is a prominent pulmonary trunk 3.4 cm indicating arterial hypertension, unchanged. There are normal caliber pulmonary veins. Mediastinum/Nodes: No intrathoracic or axillary adenopathy. Thyroid gland obscured by metallic artifact from bilateral shoulder replacements and left chest pacemaker. Small amount of retained secretions at the right posterolateral tracheal wall, otherwise unremarkable trachea and main bronchi. Lungs/Pleura: Chronic changes in the bases and moderate emphysematous disease  with both paraseptal and centrilobular changes. Right lower lobectomy with volume loss and mild asymmetric elevation of the right diaphragm. There is mild bronchial thickening. No pneumonic infiltrate or nodule is seen. No pleural effusion, thickening or pneumothorax. Musculoskeletal: There is osteopenia, degenerative disc disease and spondylosis of the thoracic spine. As above there are bilateral shoulder replacements. CT ABDOMEN PELVIS FINDINGS Hepatobiliary: The liver is unremarkable without contrast. The gallbladder and bile ducts are unremarkable. Pancreas: No focal abnormality. Spleen: Chronic subcentimeter low-attenuation lesion in the central spleen. Probable cyst or hemangioma. Stable. Otherwise unremarkable without contrast. Adrenals/Urinary Tract: There is no adrenal mass. There is bilateral renal cortical thinning. Stable left renal cysts and 1 cm nonobstructive caliceal stone in the superior pole. 2 mm stone or renovascular calcification upper pole right kidney. Perinephric stranding is also similar. There is no ureteral stone or hydronephrosis. The right side of the bladder obscured by interval new right hip replacement. The visualized bladder normal in thickness. Stomach/Bowel: Small hiatal hernia. Contracted stomach with normal caliber unopacified small bowel. Normal appendix which is well visible. Mild-to-moderate stool retention in the colon including the rectum but no rectal wall thickening or inflammation. Scattered sigmoid diverticula without diverticulitis. Vascular/Lymphatic: Aortoiliac heavy calcific plaques are again noted with aorto bi-iliac stent graft. A large excluded aneurysm sac is again noted and is larger than previously. On the 2 prior studies, the aneurysmal sac measured 6.9 x 7.0 cm, today measuring 7.6 x 7.6 cm. 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 There are endovascular coils in the region of the right internal  iliac artery which were noted previously. Reproductive: Old prostatectomy. Other: There are tiny umbilical and inguinal fat hernias. There is no incarcerated hernia. There is no free hemorrhage, free fluid or free air, or inflammatory stranding around the AAA. Multiple pelvic phleboliths. Musculoskeletal: Chronic L5-S1 fusion hardware is again noted with chronic L5 spondylolysis and grade 2 L5-S1 spondylolisthesis, unchanged. There is osteopenia degenerative change of the lumbar spine with discogenic mild grade 1 retrolisthesis unchanged at L1-2 and L2-3, slight dextroscoliosis. Interval right hip replacement. No acute hardware complications. No operative site hematoma. Ankylosis left SI joint. IMPRESSION: 1. COPD and mild chronic bronchitis with prior right lower lobectomy. No focal pneumonia is seen. Chronic prominence of the pulmonary trunk. 2. Aortic and coronary artery atherosclerosis, prior CABG, pacemaker, and abdominal aortobi-iliac stent grafting. 3. 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 Annual CTA or MRA follow-up recommended improved 4. 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 CTA would be the preferred imaging workup, MRA if the patient cannot have IV contrast. 5. Constipation and diverticulosis.  6. Nonobstructive nephrolithiasis. 7. Degenerative and postsurgical changes of the spine and osteopenia. No acute spinal compression fracture. Electronically Signed   By: Telford Nab M.D.   On: 08/14/2022 05:23   CT Head Wo Contrast  Result Date: 08/14/2022 CLINICAL DATA:  81 year old male with altered mental status. Increasing back pain with movement. EXAM: CT HEAD WITHOUT CONTRAST TECHNIQUE: Contiguous axial images were obtained from the base of the skull through the vertex without intravenous contrast. RADIATION DOSE REDUCTION: This exam was performed according to the departmental dose-optimization program which includes automated  exposure control, adjustment of the mA and/or kV according to patient size and/or use of iterative reconstruction technique. COMPARISON:  Brain MRI 01/14/2010.  Head CT 06/10/2022. 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 Small chronic bilateral cerebellar infarcts. Stable cerebral volume. Patchy encephalomalacia and dystrophic calcification in the right parietal lobe is stable. Small area of chronic encephalomalacia in the right inferior frontal gyrus is stable. No midline shift, ventriculomegaly, mass effect, evidence of mass lesion, intracranial hemorrhage or evidence of cortically based acute infarction. Vascular: Calcified atherosclerosis at the skull base. No suspicious intracranial vascular hyperdensity. Skull: No acute osseous abnormality identified. Sinuses/Orbits: Mild sinus opacification in the left frontal, frontoethmoidal recess and sphenoid sinus have not significantly changed from last month. And overall paranasal sinuses and mastoids are well aerated. Other: No acute orbit or scalp soft tissue finding. IMPRESSION: 1. No acute intracranial abnormality identified. 2. Advanced chronic ischemic disease appears stable by CT since last month. Electronically Signed   By: Genevie Ann M.D.   On: 08/14/2022 04:54   DG Chest Portable 1 View  Result Date: 08/14/2022 CLINICAL DATA:  Shortness of breath, low back pain EXAM: PORTABLE CHEST 1 VIEW COMPARISON:  08/13/2022 FINDINGS: Mild patchy right lower lobe opacity, new, suspicious for pneumonia. Trace right pleural effusion versus chronic pleural thickening. Left lung is clear. No pneumothorax. Mild cardiomegaly. Postsurgical changes related to prior CABG. Left subclavian pacemaker. Median sternotomy. IMPRESSION: Mild patchy right lower lobe opacity, new, suspicious for pneumonia. Electronically Signed   By: Julian Hy M.D.   On: 08/14/2022 03:17     Medications:    sodium  chloride 75 mL/hr at 08/14/22 1858   ampicillin (OMNIPEN) IV 2 g (08/15/22 0625)    atorvastatin  40 mg Oral QHS   azithromycin  250 mg Oral Daily   calcitRIOL  0.25 mcg Oral q AM   docusate sodium  300 mg Oral QPC lunch   donepezil  10 mg Oral QHS   ezetimibe  10 mg Oral QPM   ferrous sulfate  325 mg Oral Q2000   ipratropium  2-4 spray Each Nare QHS   lidocaine  1 patch Transdermal Q24H   loratadine  10 mg Oral Daily   melatonin  10 mg Oral QHS   methocarbamol  500 mg Oral Q2000   metoprolol succinate  25 mg Oral Daily   multivitamin with minerals  1 tablet Oral QPC lunch   pantoprazole  40 mg Oral QAC breakfast   predniSONE  40 mg Oral Q breakfast   traMADol  50 mg Oral QID   zolpidem  10 mg Oral QHS   acetaminophen, albuterol, ALPRAZolam, dextromethorphan-guaiFENesin, hydrALAZINE, HYDROmorphone (DILAUDID) injection, methocarbamol, ondansetron (ZOFRAN) IV, oxyCODONE-acetaminophen  Assessment/ Plan:  81 y.o. 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  Kidney function is at baseline.  Urinalysis shows small proteinuria.  Concern about IV contrast exposure causing worsening of kidney function.  Overall this is a difficult situation.  Patient does require proper assessment of aorta (which necessitates IV contrast exposure) prior to planning endovascular repair. Discussed this with patient.  We  also discussed possibility of worsening renal function. 2D echo from 06/14/2022 shows LVEF 60 to 73%, grade 1 diastolic dysfunction  Plan: Renal function improved from yesterday with IVF. Blood cultures indicate bacteremia. Cardiology following and awaiting further testing. Continue IVF. Blood pressure elevated this morning, however patient states his pain is worse today.     LOS: [place] 10/8/202311:59 AM  [place], [place]  Note: This note was prepared with Dragon dictation. Any transcription errors are unintentional

## 2022-08-15 NOTE — Progress Notes (Signed)
A/p E faecalis bacteremia Aortobifem leak Back pain Pacer present Cad/cabg hx Ckd4 Hx of l5-s1 fusion Hx right femoral neck fx s/p recent surgery Hx lung cancer rll s/p lobectomy  I communicated via secure chat with Dr Sheppard Coil of primary team Patient has several issues that need to be addressed as above  There is difficulty arranging imaging either mri (pacer) or ct with contrast at [facility].  Per vascular team, there is utmost concern for the aorto-bifem leak and needs urgent attention  In order of important from my point of view would be 1) clearance of bacteremia so if urgent graft exchange needed that can be done safely 2) addressing the aorto-bifem leak 3) r/o the pacemaker involvement with e faecalis bacteremia (tee) 4) further w/u on the lower back pain to r/o vertebral OM or soft tissue pyogenic involvement ([place] potentially has pacer compatible mri?)   Dr Delaine Lame to formally see tomorrow  ---------- Objectives: Vitals:   08/15/22 0802 08/15/22 1158  BP: (!) 157/72 139/80  Pulse: 72 76  Resp: 16 18  Temp: (!) 97.4 F (36.3 C) 98 F (36.7 C)  SpO2: 100% 96%   Labs: Lab Results  Component Value Date   WBC 4.4 08/15/2022   HGB 7.8 (L) 08/15/2022   HCT 25.5 (L) 08/15/2022   MCV 90.7 08/15/2022   PLT 149 (L) 89/21/1941   Last metabolic panel Lab Results  Component Value Date   GLUCOSE 104 (H) 08/15/2022   NA 137 08/15/2022   K 4.3 08/15/2022   CL 108 08/15/2022   CO2 24 08/15/2022   BUN 32 (H) 08/15/2022   CREATININE 2.33 (H) 08/15/2022   GFRNONAA 27 (L) 08/15/2022   CALCIUM 8.7 (L) 08/15/2022   PROT 6.4 (L) 06/10/2022   ALBUMIN 3.7 06/10/2022   BILITOT 0.4 06/10/2022   ALKPHOS 68 06/10/2022   AST 19 06/10/2022   ALT 20 06/10/2022   ANIONGAP 5 08/15/2022    Imaging: Reviewed 10/07 cta a/pelv IMPRESSION: VASCULAR   1. 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 The bifurcated aortic stent graft is patent but the aneurysm  sac is enlarging. Aneurysm sac measures up to 7.6 cm and measured 7.0 cm on 09/09/2021. 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 2. 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 3. Unusual location for a cardiac lead that appears to be extending through in interatrial defect and terminating in the left ventricle. This is a chronic finding.   NON-VASCULAR   1. No acute abnormality in the abdomen or pelvis. 2. Atrophy in both kidneys. 3. Aortic Atherosclerosis (ICD10-I70.0) and Emphysema (ICD10-J43.9). 4. Nonobstructive left nephrolithiasis.    I spent more than 5 minute reviewing data/chart, and coordinating care and >50% direct face to face time providing counseling/discussing diagnostics/treatment plan with patient

## 2022-08-15 NOTE — H&P (View-Only) (Signed)
Subjective/Chief Complaint: Complains of continued back pain. Denies abdominal pain.   Objective: Vital signs in last 24 hours: Temp:  [97.4 F (36.3 C)-98.4 F (36.9 C)] 97.4 F (36.3 C) (10/08 0802) Pulse Rate:  [59-78] 72 (10/08 0802) Resp:  [14-20] 16 (10/08 0802) BP: (114-160)/(55-79) 157/72 (10/08 0802) SpO2:  [92 %-100 %] 100 % (10/08 0802) Weight:  [85.4 kg] 85.4 kg (10/08 0500)    Intake/Output from previous day: 10/07 0701 - 10/08 0700 In: -  Out: 250 [Urine:250] Intake/Output this shift: No intake/output data recorded.  General appearance: alert and mild distress Resp: clear to auscultation bilaterally Cardio: regular rate and rhythm GI: soft, non-tender; bowel sounds normal; no masses,  no organomegaly Pulses: 2+ and symmetric Palpable femoral bilateral  Lab Results:  Recent Labs    08/14/22 0247 08/15/22 0440  WBC 3.8* 4.4  HGB 8.6* 7.8*  HCT 28.6* 25.5*  PLT 136* 149*   BMET Recent Labs    08/14/22 0247 08/15/22 0440  NA 137 137  K 4.6 4.3  CL 103 108  CO2 24 24  GLUCOSE 104* 104*  BUN 31* 32*  CREATININE 2.69* 2.33*  CALCIUM 9.1 8.7*   PT/INR Recent Labs    08/14/22 1958  LABPROT 14.5  INR 1.1   ABG No results for input(s): "PHART", "HCO3" in the last 72 hours.  Invalid input(s): "PCO2", "PO2"  Studies/Results: CT Angio Abd/Pel w/ and/or w/o  Result Date: 08/14/2022 CLINICAL DATA:  History of endovascular repair of an abdominal aortic aneurysm. Evaluate abdominal aortic aneurysm sac. EXAM: CTA ABDOMEN AND PELVIS WITHOUT AND WITH CONTRAST TECHNIQUE: Multidetector CT imaging of the abdomen and pelvis was performed using the standard protocol during bolus administration of intravenous contrast. Multiplanar reconstructed images and MIPs were obtained and reviewed to evaluate the vascular anatomy. RADIATION DOSE REDUCTION: This exam was performed according to the departmental dose-optimization program which includes automated  exposure control, adjustment of the mA and/or kV according to patient size and/or use of iterative reconstruction technique. CONTRAST:  39mL OMNIPAQUE IOHEXOL 350 MG/ML SOLN COMPARISON:  CT chest abdomen pelvis 08/14/2022 and PET-CT 09/09/2021 FINDINGS: VASCULAR Aorta: Endovascular repair of an abdominal aortic aneurysm with a bifurcated infrarenal aortic stent graft. Aortic stent graft is patent. The aneurysm sac measures up to 7.6 cm and measured 7.0 cm on 09/09/2021. There is contrast within the posterior proximal aspect of the aneurysm sac likely coming from lumbar arteries. Large amount of contrast within the sac on the delayed images. 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 No evidence for an aortic sac rupture. Celiac: Patent without evidence of aneurysm, dissection, vasculitis or significant stenosis. Incidentally, there is an accessory left hepatic artery coming off the left gastric artery. SMA: Approximately 50% stenosis involving the origin and proximal aspect of the SMA related to mixed plaque. SMA is patent. Renals: Both renal arteries are patent without evidence of aneurysm, dissection, vasculitis, fibromuscular dysplasia or significant stenosis. IMA: Retrograde filling of the inferior mesenteric artery and there is contrast near the origin. 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 Inflow: Left limb terminates in the distal left common iliac artery. Left internal and external iliac arteries are patent. There is focal dilatation in the left internal iliac artery measuring up to 1.2 cm. Right limb extends into the right external iliac artery and there has been endovascular embolization of the right internal iliac artery origin. Excluded right common iliac artery aneurysm sac measures 3.1 cm and similar  to the exam from 09/09/2021. Right external iliac artery is patent. Proximal Outflow: Proximal femoral arteries are patent bilaterally Veins: IVC and renal veins are  patent. No gross abnormality to the iliac veins. Review of the MIP images confirms the above findings. NON-VASCULAR Lower chest: Emphysema. 8 mm nodule in the left upper lobe on sequence 7, image 28 is stable since 09/18/2019. No large pleural effusion. Again noted is a cardiac pacemaker with a lead extending through an interatrial defect and appears to be terminating in the left ventricle. The other cardiac lead is in the right atrial appendage. Pacemaker leads appear to be chronic. Post CABG changes. Hepatobiliary: Normal appearance of the liver and gallbladder. Pancreas: Unremarkable. No pancreatic ductal dilatation or surrounding inflammatory changes. Spleen: 9 mm hyper hypodensity in the spleen appears chronic no acute abnormality. Adrenals/Urinary Tract: Normal appearance of the adrenal glands. Both kidneys are atrophic without hydronephrosis. Low-density cyst in left kidney again noted and do not require dedicated follow-up. 9 mm stone in the left kidney upper pole. Normal appearance of the urinary bladder. Small calcification in the right kidney upper pole region could be vascular in etiology. Stomach/Bowel: Rectum is distended with gas and stool. No evidence for bowel obstruction or focal bowel inflammation. Normal appearance of the stomach. Lymphatic: No significant lymph node enlargement in the abdomen or pelvis. Reproductive: Evidence for prostatectomy. Other: Negative for free fluid.  Negative for free air. Musculoskeletal: There is a intramedullary nail in the right humerus which is incompletely imaged. Right hip arthroplasty is located. Bilateral pedicle screw and rod fixation at L5-S1. Stable anterolisthesis of L5 on S1 with disc space loss at L5-S1. Disc space narrowing at L4-L5. Chronic mild retrolisthesis of L2 on L3. IMPRESSION: VASCULAR 1. 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 The bifurcated aortic stent graft is patent but the aneurysm sac is enlarging. Aneurysm sac measures up  to 7.6 cm and measured 7.0 cm on 09/09/2021. 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 2. 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 3. Unusual location for a cardiac lead that appears to be extending through in interatrial defect and terminating in the left ventricle. This is a chronic finding. NON-VASCULAR 1. No acute abnormality in the abdomen or pelvis. 2. Atrophy in both kidneys. 3. Aortic Atherosclerosis (ICD10-I70.0) and Emphysema (ICD10-J43.9). 4. Nonobstructive left nephrolithiasis. Electronically Signed   By: Markus Daft M.D.   On: 08/14/2022 15:29   CT CHEST ABDOMEN PELVIS WO CONTRAST  Result Date: 08/14/2022 CLINICAL DATA:  Pneumonia, complications suspected. Low back pain onset 2 days ago worsening with movement. Shortness of breath. EXAM: CT CHEST, ABDOMEN AND PELVIS WITHOUT CONTRAST TECHNIQUE: Multidetector CT imaging of the chest, abdomen and pelvis was performed following the standard protocol without IV contrast. RADIATION DOSE REDUCTION: This exam was performed according to the departmental dose-optimization program which includes automated exposure control, adjustment of the mA and/or kV according to patient size and/or use of iterative reconstruction technique. COMPARISON:  PET-CT skull base to pelvis 09/09/2021, CT abdomen pelvis without contrast 03/31/2021. Most recent chest x-ray was portable chest today, portable chest 08/13/2022. FINDINGS: CT CHEST FINDINGS Cardiovascular: There is mild cardiomegaly. Metal artifact from left chest dual lead pacing system and dual lead wires in the heart. There are CABG changes. Aortic tortuosity and moderate patchy arthrosclerosis with aortic root ectasia up to 3.8 cm and ascending aorta with mild dilatation to 4.2 cm. The remainder is within  normal caliber limits with scattered calcific plaque in the great vessels. There is a prominent pulmonary trunk 3.4 cm indicating  arterial hypertension, unchanged. There are normal caliber pulmonary veins. Mediastinum/Nodes: No intrathoracic or axillary adenopathy. Thyroid gland obscured by metallic artifact from bilateral shoulder replacements and left chest pacemaker. Small amount of retained secretions at the right posterolateral tracheal wall, otherwise unremarkable trachea and main bronchi. Lungs/Pleura: Chronic changes in the bases and moderate emphysematous disease with both paraseptal and centrilobular changes. Right lower lobectomy with volume loss and mild asymmetric elevation of the right diaphragm. There is mild bronchial thickening. No pneumonic infiltrate or nodule is seen. No pleural effusion, thickening or pneumothorax. Musculoskeletal: There is osteopenia, degenerative disc disease and spondylosis of the thoracic spine. As above there are bilateral shoulder replacements. CT ABDOMEN PELVIS FINDINGS Hepatobiliary: The liver is unremarkable without contrast. The gallbladder and bile ducts are unremarkable. Pancreas: No focal abnormality. Spleen: Chronic subcentimeter low-attenuation lesion in the central spleen. Probable cyst or hemangioma. Stable. Otherwise unremarkable without contrast. Adrenals/Urinary Tract: There is no adrenal mass. There is bilateral renal cortical thinning. Stable left renal cysts and 1 cm nonobstructive caliceal stone in the superior pole. 2 mm stone or renovascular calcification upper pole right kidney. Perinephric stranding is also similar. There is no ureteral stone or hydronephrosis. The right side of the bladder obscured by interval new right hip replacement. The visualized bladder normal in thickness. Stomach/Bowel: Small hiatal hernia. Contracted stomach with normal caliber unopacified small bowel. Normal appendix which is well visible. Mild-to-moderate stool retention in the colon including the rectum but no rectal wall thickening or inflammation. Scattered sigmoid diverticula without  diverticulitis. Vascular/Lymphatic: Aortoiliac heavy calcific plaques are again noted with aorto bi-iliac stent graft. A large excluded aneurysm sac is again noted and is larger than previously. On the 2 prior studies, the aneurysmal sac measured 6.9 x 7.0 cm, today measuring 7.6 x 7.6 cm. 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 There are endovascular coils in the region of the right internal iliac artery which were noted previously. Reproductive: Old prostatectomy. Other: There are tiny umbilical and inguinal fat hernias. There is no incarcerated hernia. There is no free hemorrhage, free fluid or free air, or inflammatory stranding around the AAA. Multiple pelvic phleboliths. Musculoskeletal: Chronic L5-S1 fusion hardware is again noted with chronic L5 spondylolysis and grade 2 L5-S1 spondylolisthesis, unchanged. There is osteopenia degenerative change of the lumbar spine with discogenic mild grade 1 retrolisthesis unchanged at L1-2 and L2-3, slight dextroscoliosis. Interval right hip replacement. No acute hardware complications. No operative site hematoma. Ankylosis left SI joint. IMPRESSION: 1. COPD and mild chronic bronchitis with prior right lower lobectomy. No focal pneumonia is seen. Chronic prominence of the pulmonary trunk. 2. Aortic and coronary artery atherosclerosis, prior CABG, pacemaker, and abdominal aortobi-iliac stent grafting. 3. 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 Annual CTA or MRA follow-up recommended improved 4. 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 CTA would be the preferred imaging workup, MRA if the patient cannot have IV contrast. 5. Constipation and diverticulosis. 6. Nonobstructive nephrolithiasis. 7. Degenerative and postsurgical changes of the spine and osteopenia. No acute spinal compression fracture. Electronically Signed   By: Telford Nab  M.D.   On: 08/14/2022 05:23   NM Pulmonary Perfusion  Result Date: 08/14/2022 CLINICAL DATA:  This study identified as missing a report at 4:54 am  on 08/14/2022. 81 year old male with shortness of breath for 2 months. EXAM: NUCLEAR MEDICINE PERFUSION LUNG SCAN TECHNIQUE: Perfusion images were obtained in multiple projections after intravenous injection of radiopharmaceutical. Ventilation scans intentionally deferred if perfusion scan and chest x-ray adequate for interpretation during COVID 19 epidemic. RADIOPHARMACEUTICALS:  4.4 mCi Tc-70m MAA IV COMPARISON:  PA and lateral chest radiographs 08/13/2022. Subsequent CT Chest, Abdomen, and Pelvis 08/14/2022 at 0411 hours. FINDINGS: Fairly homogeneous lung perfusion radiotracer activity when accounting for chronic right lung pleural scarring and costophrenic angle blunting demonstrated radiographically, and centrilobular and paraseptal emphysema demonstrated by CT this morning. No suspicious perfusion defect. IMPRESSION: Chronic lung disease. No scintigraphic evidence of pulmonary embolus. Electronically Signed   By: Genevie Ann M.D.   On: 08/14/2022 04:58   CT Head Wo Contrast  Result Date: 08/14/2022 CLINICAL DATA:  81 year old male with altered mental status. Increasing back pain with movement. EXAM: CT HEAD WITHOUT CONTRAST TECHNIQUE: Contiguous axial images were obtained from the base of the skull through the vertex without intravenous contrast. RADIATION DOSE REDUCTION: This exam was performed according to the departmental dose-optimization program which includes automated exposure control, adjustment of the mA and/or kV according to patient size and/or use of iterative reconstruction technique. COMPARISON:  Brain MRI 01/14/2010.  Head CT 06/10/2022. 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 Small chronic bilateral cerebellar infarcts. Stable cerebral volume. Patchy encephalomalacia and  dystrophic calcification in the right parietal lobe is stable. Small area of chronic encephalomalacia in the right inferior frontal gyrus is stable. No midline shift, ventriculomegaly, mass effect, evidence of mass lesion, intracranial hemorrhage or evidence of cortically based acute infarction. Vascular: Calcified atherosclerosis at the skull base. No suspicious intracranial vascular hyperdensity. Skull: No acute osseous abnormality identified. Sinuses/Orbits: Mild sinus opacification in the left frontal, frontoethmoidal recess and sphenoid sinus have not significantly changed from last month. And overall paranasal sinuses and mastoids are well aerated. Other: No acute orbit or scalp soft tissue finding. IMPRESSION: 1. No acute intracranial abnormality identified. 2. Advanced chronic ischemic disease appears stable by CT since last month. Electronically Signed   By: Genevie Ann M.D.   On: 08/14/2022 04:54   DG Chest Portable 1 View  Result Date: 08/14/2022 CLINICAL DATA:  Shortness of breath, low back pain EXAM: PORTABLE CHEST 1 VIEW COMPARISON:  08/13/2022 FINDINGS: Mild patchy right lower lobe opacity, new, suspicious for pneumonia. Trace right pleural effusion versus chronic pleural thickening. Left lung is clear. No pneumothorax. Mild cardiomegaly. Postsurgical changes related to prior CABG. Left subclavian pacemaker. Median sternotomy. IMPRESSION: Mild patchy right lower lobe opacity, new, suspicious for pneumonia. Electronically Signed   By: Julian Hy M.D.   On: 08/14/2022 03:17    Anti-infectives: Anti-infectives (From admission, onward)    Start     Dose/Rate Route Frequency Ordered Stop   08/15/22 1000  azithromycin (ZITHROMAX) tablet 250 mg       See Hyperspace for full Linked Orders Report.   250 mg Oral Daily 08/14/22 0822 08/19/22 0959   08/14/22 2200  ampicillin (OMNIPEN) 2 g in sodium chloride 0.9 % 100 mL IVPB        2 g 300 mL/hr over 20 Minutes Intravenous Every 8 hours 08/14/22  2037     08/14/22 1000  azithromycin (ZITHROMAX) tablet 500 mg       See Hyperspace for full Linked Orders Report.   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 Therefore, preferable to intervene sooner than later ABX per ID Cardiac- TEE Consider Ortho spine consult  NPO after MN Discussed plan with Patient/ wife- all questions answered, understanding expressed regarding risks, benefits and alternatives.  LOS: 1 day    Jamesetta So A 08/15/2022

## 2022-08-15 NOTE — Progress Notes (Signed)
PROGRESS NOTE    Rick Mcbride.   SWF:[id number] DOB: 06-Dec-1940  DOA: 08/14/2022 Date of Service: 08/15/22 PCP: Idelle Crouch, MD     Brief Narrative / Hospital Course:  Rick T Paxton Binns. is a 81 y.o. 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 Patient had surgery and finished SNF rehab. Currently he is doing PT/OT at home), chronic lower back pain (L5-S1 fusion), who presents with lower back pain and shortness breath. Pt had negative PE by V/Q scan which is ordered by his PCP day PTA, which was done for persistent SOB postoperatively. 10/07: WBC 3.8, negative COVID PCR, lactic acid 0.9, troponin level 31, 30, slightly worsening renal function with creatinine 2.69, BUN 31, GFR 23 (recent baseline creatinine 2.39 06/15/2022), temperature normal, blood pressure 172/84, 129/64, RR 83, heart rate 83, RR 17, oxygen saturation 98% on room air.  Chest x-ray showed possible mild patchy infiltration in left lower lobe.  CT head negative.  CT scan showed enlargement of AAA from previous 6.9 to 7.6 cm now.  Patient is admitted to telemetry bed as inpatient. Dr. Lorenso Courier of [facility] was consulted re: AAA, needs CTA, nephrology consulted and recommended IV fluids prior to CTA of Abd/Pelvis. Cardiology also consulted for surgical optimization/risk stratification - medically optimized at this time, troponin mild elevation likely d/t CKD4. BCx (+) E Faecalis, ID recommended r/o endocarditis/pacer infection w/ TEE and vascular surgery to assess re: graft involvement, changed Abx to ampicillin and repeat Bcx, also recommend MRI  L-spine or CT w/ contrast, pt has pacemaker   10/08: Pt and wife agreeable to TEE and MRI, though MRI would have to be at [facility]. Per vascular: "Noted bacteremia- however, patient with chronic progressive lower back pain; expanding aortic sac may be contributory to pain- not definitive. Therefore, preferable to intervene sooner than later." Per ID, would consider in order of importance to 1) treat bacteremia, 2) address aorto-bifem leak, 3) TEE, 4) MRI/CT L-spine. IF we decide to do CT, this can be done in-house, vs RI would require transfer. Will plan to proceed w/ IV abx, do vascular procedure tomorrow as scheduled, and see how he progresses from there.     Consultants:  Vascular surgery - TAA, AAA enlarged, R common iliac aneurysm Nephrology - CKD4 needing contrast study Cardiology - clearance for vascular procedure given CAD/CABG, Chronic HFpEF ID - (+)Bcx  Procedures: None at this time   Pertinent Results: 08/14/22: (+)BCx, G(+)cocci --> E Faecalis  08/14/22: CTA Abd/Pelvis - 1. 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 The bifurcated aortic stent graft is patent but the aneurysm sac is enlarging. Aneurysm sac measures up to 7.6 cm and measured 7.0 cm on 09/09/2021. 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 2. 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 3. Unusual location for a cardiac lead that appears to be extending through in interatrial defect and terminating in the left ventricle. This is a chronic finding.     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 Lucky Cowboy. Now the size has enlarged from 6.9 to 7.6 cm consulted Dr. Lorenso Courier of [facility] - likely need surgical intervention. See CTA results  continue NS at 75cc/h  CAD S/P CABG x 3 Hx of CAD s/p of CABG and myocardial injury due to COPD exacerbation:  Myocardial injury less likely vs Troponin elevation d/t CKD4 Troponin level 31, 30.  Denies chest pain. Continue home Lipitor, Zetia hold ASA consulted Dr. Saralyn Pilar of cardiology per Dr. Dolores Hoose recommendation - optimized from cardiac standpoint, though risk is present. Troponins likely d/t CKD  Low back pain This seems to be a chronic issue, has worsened recently. 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 There is osteopenia degenerative change of the lumbar spine with discogenic mild grade 1 retrolisthesis unchanged at L1-2 and L2-3, slight dextroscoliosis. pain control: As needed Percocet, Tylenol As needed Robaxin Lidoderm MRI L Spine per ID recs given (+)Bcx   Chronic kidney disease, stage IV (severe) (HCC) Slightly worsening than baseline. Monitor renal function closely by BMP pt received 1L of NS in ED Nephrology following - see note for recs re: contrast administration for CTA   Positive blood cultures - E Facealis Bacteremia ID to follow Ampicillin Needs TEE - will confirm w/ cardiology  Needs MRI L spine - will hold off for now pending vascular procedure   COPD  exacerbation ([place]) Patient is a former smoker, smoked more than 20 years.  CT chest did not show focal infiltration, does not seem to have pneumonia.  Mild wheezing on auscultation, indicating possible undiagnosed COPD Procalcitonin elevated at 0.39.   Patient does not meet criteria for sepsis. will admit to tele bed as inpatient Bronchodilators pt was given 80 mg of Solu-Medrol in ED --> will change to oral prednisone 40 mg daily Z pak  Mucinex for cough  Incentive spirometry Urine S. Pneumococcal and legionella antigen sputum culture Nasal cannula oxygen as needed to maintain O2 saturation 93% or greater if pt desat  Essential hypertension IV hydralazine as needed Metoprolol hold Cozaar since pt will receive contrast   Chronic diastolic CHF (congestive heart failure) ([place]) 2D echo on 06/14/2022 showed EF of 60 to 65% with grade 1 diastolic dysfunction.   CHF seem to be compensated. Check BNP  HLD (hyperlipidemia) Continue to Lipitor and zetia  Iron deficiency anemia Hemoglobin 8.6 (9.7 on 06/15/2022, and 8.6 on 06/13/2022), no active bleeding. Follow-up with CBC Continue iron supplement  History of TIA (transient ischemic attack) Continue Lipitor Hold ASA and Plavix with pending vascular surgeon's evaluation of his AAA.  Mild cognitive impairment Continue donepezil  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[place])  s/p of RRL lobectomy and radiation therapy.  No acute issues.  Obesity (BMI 30-39.9) BMI= 30.68  and BW= 99.8 Diet and exercise.   Encourage to lose weight.  Anxiety start prn Xanax 0.25 mg bid    DVT prophylaxis: holding pharmacologic given bleed risk  Pertinent IV fluids/nutrition: IV fluids NS 75 mL/h Central lines / invasive devices: none  Code Status: DNR Family Communication: wife at bedside on rounds  Disposition: inpatient TOC needs: pending clinical progression  Barriers to discharge / significant pending items: see A/P, anticipate  lengthy stay              Subjective:  Patient reports back pain and he's hungry but otherwise no concerns, no CP/SOB, no headache, no nausea.        Objective:  Vitals:   08/15/22 0500 08/15/22 0600 08/15/22 0802 08/15/22 1158  BP:  (!) 160/79 (!) 157/72 139/80  Pulse:  67 72 76  Resp:  18 16 18   Temp:  97.8 F (36.6 C) (!) 97.4 F (36.3 C) 98 F (36.7 C)  TempSrc:  Oral Oral   SpO2:  99% 100% 96%  Weight: 85.4 kg     Height:        Intake/Output Summary (Last 24 hours) at 08/15/2022 1606 Last data filed at 08/15/2022 1153 Gross per 24 hour  Intake --  Output 700 ml  Net -700 ml   Filed Weights   08/14/22 0232 08/15/22 0500  Weight: 99.8 kg 85.4 kg    Examination:  Constitutional:  VS as above General Appearance: alert, well-developed, well-nourished, NAD Eyes: Normal lids and conjunctive, non-icteric sclera Ears, Nose, Mouth, Throat: Normal external appearance MMM Neck: No masses, trachea midline Respiratory: Normal respiratory effort No wheeze No rhonchi No rales Cardiovascular: S1/S2 normal RRR No murmur No lower extremity edema Gastrointestinal: No tenderness Musculoskeletal:  Symmetrical movement in all extremities Neurological: No cranial nerve deficit on limited exam Alert Psychiatric: Normal judgment/insight Normal mood and affect       Scheduled Medications:   atorvastatin  40 mg Oral QHS   azithromycin  250 mg Oral Daily   calcitRIOL  0.25 mcg Oral q AM   docusate sodium  300 mg Oral QPC lunch   donepezil  10 mg Oral QHS   ezetimibe  10 mg Oral QPM   ferrous sulfate  325 mg Oral Q2000   ipratropium  2-4 spray Each Nare QHS   lidocaine  1 patch Transdermal Q24H   loratadine  10 mg Oral Daily   melatonin  10 mg Oral QHS   methocarbamol  500 mg Oral Q2000   metoprolol succinate  25 mg Oral Daily   multivitamin with minerals  1 tablet Oral QPC lunch   pantoprazole  40 mg Oral QAC breakfast   predniSONE  40 mg  Oral Q breakfast   traMADol  50 mg Oral QID   zolpidem  10 mg Oral QHS    Continuous Infusions:  sodium chloride 75 mL (08/15/22 1215)   ampicillin (OMNIPEN) IV 2 g (08/15/22 1524)    PRN Medications:  acetaminophen, albuterol, ALPRAZolam, dextromethorphan-guaiFENesin, hydrALAZINE, HYDROmorphone (DILAUDID) injection, methocarbamol, ondansetron (ZOFRAN) IV, oxyCODONE-acetaminophen  Antimicrobials:  Anti-infectives (From admission, onward)    Start     Dose/Rate Route Frequency Ordered Stop   08/15/22 1000  azithromycin (ZITHROMAX) tablet 250 mg       See Hyperspace for full Linked Orders Report.   250 mg Oral Daily 08/14/22 0822 08/19/22 0959   08/14/22 2200  ampicillin (OMNIPEN) 2 g in sodium chloride 0.9 % 100 mL IVPB        2 g 300 mL/hr over 20 Minutes Intravenous Every 8 hours 08/14/22 2037     08/14/22 1000  azithromycin (ZITHROMAX) tablet 500 mg       See Hyperspace for full Linked Orders Report.   500 mg Oral Daily 08/14/22 0822 08/14/22 0944       Data Reviewed: I have personally reviewed following labs and imaging studies  CBC: Recent Labs  Lab 08/14/22 0247 08/15/22 0440  WBC 3.8* 4.4  HGB 8.6* 7.8*  HCT 28.6* 25.5*  MCV 92.9 90.7  PLT 136* 196*   Basic Metabolic Panel: Recent Labs  Lab 08/14/22 0247 08/15/22 0440  NA 137 137  K 4.6 4.3  CL 103 108  CO2 24 24  GLUCOSE 104* 104*  BUN 31* 32*  CREATININE 2.69* 2.33*  CALCIUM 9.1 8.7*   GFR: Estimated Creatinine Clearance: 26.5 mL/min (A) (by C-G formula based on SCr of 2.33 mg/dL (H)). Liver Function Tests: No results for input(s): "AST", "ALT", "ALKPHOS", "BILITOT", "PROT", "ALBUMIN" in the last 168 hours. No results for input(s): "LIPASE", "AMYLASE" in the last 168 hours. No results for input(s): "AMMONIA" in the last 168 hours. Coagulation Profile: Recent Labs  Lab 08/14/22 1958  INR 1.1   Cardiac Enzymes: No results for input(s): "CKTOTAL", "CKMB", "CKMBINDEX", "TROPONINI" in the last  168 hours. BNP (last 3 results) No results for input(s): "PROBNP" in the last 8760 hours. HbA1C: No results for input(s): "HGBA1C" in the last 72 hours. CBG: No results for input(s): "GLUCAP" in the last 168 hours. Lipid Profile: No results for input(s): "CHOL", "HDL", "LDLCALC", "TRIG", "CHOLHDL", "LDLDIRECT" in the last 72 hours. Thyroid Function Tests: No results for input(s): "TSH", "T4TOTAL", "FREET4", "T3FREE", "THYROIDAB" in the last 72 hours. Anemia Panel: No results for input(s): "VITAMINB12", "FOLATE", "FERRITIN", "TIBC", "IRON", "RETICCTPCT" in the last 72 hours. Urine analysis:    Component Value Date/Time   COLORURINE YELLOW (A) 08/14/2022 0654   APPEARANCEUR CLEAR (A) 08/14/2022 0654   APPEARANCEUR Cloudy (A) 03/09/2021 1332   LABSPEC 1.017 08/14/2022 0654   LABSPEC 1.015 10/25/2014 1330   PHURINE 6.0 08/14/2022 0654   GLUCOSEU NEGATIVE 08/14/2022 0654   GLUCOSEU Negative 10/25/2014 1330   HGBUR NEGATIVE 08/14/2022 0654   BILIRUBINUR NEGATIVE 08/14/2022 0654   BILIRUBINUR Negative 03/09/2021 1332   BILIRUBINUR Negative 10/25/2014 [place] 08/14/2022 0654   PROTEINUR 30 (A) 08/14/2022 0654   NITRITE NEGATIVE 08/14/2022 0654   LEUKOCYTESUR NEGATIVE 08/14/2022 0654   LEUKOCYTESUR Negative 10/25/2014 1330   Sepsis Labs: @LABRCNTIP (procalcitonin:4,lacticidven:4)  Recent Results (from the past 240 hour(s))  Culture, blood (routine x 2)     Status: None (Preliminary result)   Collection Time: 08/14/22  3:57 AM   Specimen: BLOOD  Result Value Ref Range Status   Specimen Description BLOOD BLOOD RIGHT ARM  Final   Special Requests   Final    BOTTLES DRAWN AEROBIC AND ANAEROBIC Blood Culture adequate volume   Culture  Setup Time   Final    Organism ID to follow [place] AND ANAEROBIC BOTTLES CRITICAL RESULT CALLED TO, READ BACK BY AND VERIFIED WITH: PHARMD CHILDS AT 1957 08/14/2022 GAA Performed at [facility],  [address] [place], [place] [address]    Culture GRAM POSITIVE COCCI  Final   Report Status PENDING  Incomplete  Culture, blood (routine x 2)  Status: None (Preliminary result)   Collection Time: 08/14/22  3:57 AM   Specimen: BLOOD  Result Value Ref Range Status   Specimen Description BLOOD BLOOD LEFT ARM  Final   Special Requests   Final    BOTTLES DRAWN AEROBIC AND ANAEROBIC Blood Culture adequate volume   Culture  Setup Time   Final    GRAM POSITIVE COCCI AEROBIC BOTTLE ONLY CRITICAL RESULT CALLED TO, READ BACK BY AND VERIFIED WITH: PHARMD CHILDS AT [place] 08/14/2022 GAA GRAM STAIN REVIEWED-AGREE WITH RESULT Performed at [facility], [address] [place], [place] [address]    Culture GRAM POSITIVE COCCI  Final   Report Status PENDING  Incomplete  Resp Panel by RT-PCR (Flu A&B, Covid) Anterior Nasal Swab     Status: None   Collection Time: 08/14/22  3:57 AM   Specimen: Anterior Nasal Swab  Result Value Ref Range Status   SARS Coronavirus 2 by RT PCR NEGATIVE NEGATIVE Final    Comment: (NOTE) SARS-CoV-2 target nucleic acids are NOT DETECTED.  The SARS-CoV-2 RNA is generally detectable in upper respiratory specimens during the acute phase of infection. The lowest concentration of SARS-CoV-2 viral copies this assay can detect is 138 copies/mL. A negative result does not preclude SARS-Cov-2 infection and should not be used as the sole basis for treatment or other patient management decisions. A negative result may occur with  improper specimen collection/handling, submission of specimen other than nasopharyngeal swab, presence of viral mutation(s) within the areas targeted by this assay, and inadequate number of viral copies(<138 copies/mL). A negative result must be combined with clinical observations, patient history, and epidemiological information. The expected result is Negative.  Fact Sheet for Patients:   EntrepreneurPulse.com.au  Fact Sheet for Healthcare Providers:  IncredibleEmployment.be  This test is no t yet approved or cleared by the [place] FDA and  has been authorized for detection and/or diagnosis of SARS-CoV-2 by FDA under an Emergency Use Authorization (EUA). This EUA will remain  in effect (meaning this test can be used) for the duration of the COVID-19 declaration under Section 564(b)(1) of the Act, 21 U.S.C.section 360bbb-3(b)(1), unless the authorization is terminated  or revoked sooner.       Influenza A by PCR NEGATIVE NEGATIVE Final   Influenza B by PCR NEGATIVE NEGATIVE Final    Comment: (NOTE) The Xpert Xpress SARS-CoV-2/FLU/RSV plus assay is intended as an aid in the diagnosis of influenza from Nasopharyngeal swab specimens and should not be used as a sole basis for treatment. Nasal washings and aspirates are unacceptable for Xpert Xpress SARS-CoV-2/FLU/RSV testing.  Fact Sheet for Patients: EntrepreneurPulse.com.au  Fact Sheet for Healthcare Providers: IncredibleEmployment.be  This test is not yet approved or cleared by the [place] FDA and has been authorized for detection and/or diagnosis of SARS-CoV-2 by FDA under an Emergency Use Authorization (EUA). This EUA will remain in effect (meaning this test can be used) for the duration of the COVID-19 declaration under Section 564(b)(1) of the Act, 21 U.S.C. section 360bbb-3(b)(1), unless the authorization is terminated or revoked.  Performed at [facility], [address] [place], [place] [address]   Blood Culture ID Panel (Reflexed)     Status: Abnormal   Collection Time: 08/14/22  3:57 AM  Result Value Ref Range Status   Enterococcus faecalis DETECTED (A) NOT DETECTED Final    Comment: CRITICAL RESULT CALLED TO, READ BACK BY AND VERIFIED WITH: PHARMD CHILDS AT 1957 08/14/2022 GAA    Enterococcus Faecium  NOT DETECTED NOT DETECTED Final   Listeria monocytogenes NOT DETECTED NOT DETECTED Final   Staphylococcus species NOT DETECTED NOT DETECTED Final   Staphylococcus aureus (BCID) NOT DETECTED NOT DETECTED Final   Staphylococcus epidermidis NOT DETECTED NOT DETECTED Final   Staphylococcus lugdunensis NOT DETECTED NOT DETECTED Final   Streptococcus species NOT DETECTED NOT DETECTED Final   Streptococcus agalactiae NOT DETECTED NOT DETECTED Final   Streptococcus pneumoniae NOT DETECTED NOT DETECTED Final   Streptococcus pyogenes NOT DETECTED NOT DETECTED Final   A.calcoaceticus-baumannii NOT DETECTED NOT DETECTED Final   Bacteroides fragilis NOT DETECTED NOT DETECTED Final   Enterobacterales NOT DETECTED NOT DETECTED Final   Enterobacter cloacae complex NOT DETECTED NOT DETECTED Final   Escherichia coli NOT DETECTED NOT DETECTED Final   Klebsiella aerogenes NOT DETECTED NOT DETECTED Final   Klebsiella oxytoca NOT DETECTED NOT DETECTED Final   Klebsiella pneumoniae NOT DETECTED NOT DETECTED Final   Proteus species NOT DETECTED NOT DETECTED Final   Salmonella species NOT DETECTED NOT DETECTED Final   Serratia marcescens NOT DETECTED NOT DETECTED Final   Haemophilus influenzae NOT DETECTED NOT DETECTED Final   Neisseria meningitidis NOT DETECTED NOT DETECTED Final   Pseudomonas aeruginosa NOT DETECTED NOT DETECTED Final   Stenotrophomonas maltophilia NOT DETECTED NOT DETECTED Final   Candida albicans NOT DETECTED NOT DETECTED Final   Candida auris NOT DETECTED NOT DETECTED Final   Candida glabrata NOT DETECTED NOT DETECTED Final   Candida krusei NOT DETECTED NOT DETECTED Final   Candida parapsilosis NOT DETECTED NOT DETECTED Final   Candida tropicalis NOT DETECTED NOT DETECTED Final   Cryptococcus neoformans/gattii NOT DETECTED NOT DETECTED Final   Vancomycin resistance NOT DETECTED NOT DETECTED Final    Comment: Performed at [facility], [address] [place], [place] [address]   Urine Culture     Status: Abnormal (Preliminary result)   Collection Time: 08/14/22  6:54 AM   Specimen: Urine, Clean Catch  Result Value Ref Range Status   Specimen Description   Final    URINE, CLEAN CATCH Performed at [facility], [address], [place], [place] [address]    Special Requests   Final    NONE Performed at [facility], [address] [place], [place] [address]    Culture (A)  Final    >=100,000 COLONIES/mL ENTEROCOCCUS FAECALIS SUSCEPTIBILITIES TO FOLLOW Performed at [facility], [address], [place], [place] [address]    Report Status PENDING  Incomplete         Radiology Studies: CT Angio Abd/Pel w/ and/or w/o  Result Date: 08/14/2022 CLINICAL DATA:  History of endovascular repair of an abdominal aortic aneurysm. Evaluate abdominal aortic aneurysm sac. EXAM: CTA ABDOMEN AND PELVIS WITHOUT AND WITH CONTRAST TECHNIQUE: Multidetector CT imaging of the abdomen and pelvis was performed using the standard protocol during bolus administration of intravenous contrast. Multiplanar reconstructed images and MIPs were obtained and reviewed to evaluate the vascular anatomy. RADIATION DOSE REDUCTION: This exam was performed according to the departmental dose-optimization program which includes automated exposure control, adjustment of the mA and/or kV according to patient size and/or use of iterative reconstruction technique. CONTRAST:  14mL OMNIPAQUE IOHEXOL 350 MG/ML SOLN COMPARISON:  CT chest abdomen pelvis 08/14/2022 and PET-CT 09/09/2021 FINDINGS: VASCULAR Aorta: Endovascular repair of an abdominal aortic aneurysm with a bifurcated infrarenal aortic stent graft. Aortic stent graft is patent. The aneurysm sac measures up to 7.6 cm and measured 7.0 cm on 09/09/2021. There is contrast  within the posterior proximal aspect of the aneurysm sac likely coming from lumbar arteries. Large amount of contrast within the sac on the delayed images. Findings  are most compatible with a type 2 endoleak. No evidence for an aortic sac rupture. Celiac: Patent without evidence of aneurysm, dissection, vasculitis or significant stenosis. Incidentally, there is an accessory left hepatic artery coming off the left gastric artery. SMA: Approximately 50% stenosis involving the origin and proximal aspect of the SMA related to mixed plaque. SMA is patent. Renals: Both renal arteries are patent without evidence of aneurysm, dissection, vasculitis, fibromuscular dysplasia or significant stenosis. IMA: Retrograde filling of the inferior mesenteric artery and there is contrast near the origin. 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 Inflow: Left limb terminates in the distal left common iliac artery. Left internal and external iliac arteries are patent. There is focal dilatation in the left internal iliac artery measuring up to 1.2 cm. Right limb extends into the right external iliac artery and there has been endovascular embolization of the right internal iliac artery origin. 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 Right external iliac artery is patent. Proximal Outflow: Proximal femoral arteries are patent bilaterally Veins: IVC and renal veins are patent. No gross abnormality to the iliac veins. Review of the MIP images confirms the above findings. NON-VASCULAR Lower chest: Emphysema. 8 mm nodule in the left upper lobe on sequence 7, image 28 is stable since 09/18/2019. No large pleural effusion. Again noted is a cardiac pacemaker with a lead extending through an interatrial defect and appears to be terminating in the left ventricle. The other cardiac lead is in the right atrial appendage. Pacemaker leads appear to be chronic. Post CABG changes. Hepatobiliary: Normal appearance of the liver and gallbladder. Pancreas: Unremarkable. No pancreatic ductal dilatation or surrounding inflammatory changes.  Spleen: 9 mm hyper hypodensity in the spleen appears chronic no acute abnormality. Adrenals/Urinary Tract: Normal appearance of the adrenal glands. Both kidneys are atrophic without hydronephrosis. Low-density cyst in left kidney again noted and do not require dedicated follow-up. 9 mm stone in the left kidney upper pole. Normal appearance of the urinary bladder. Small calcification in the right kidney upper pole region could be vascular in etiology. Stomach/Bowel: Rectum is distended with gas and stool. No evidence for bowel obstruction or focal bowel inflammation. Normal appearance of the stomach. Lymphatic: No significant lymph node enlargement in the abdomen or pelvis. Reproductive: Evidence for prostatectomy. Other: Negative for free fluid.  Negative for free air. Musculoskeletal: There is a intramedullary nail in the right humerus which is incompletely imaged. Right hip arthroplasty is located. Bilateral pedicle screw and rod fixation at L5-S1. Stable anterolisthesis of L5 on S1 with disc space loss at L5-S1. Disc space narrowing at L4-L5. Chronic mild retrolisthesis of L2 on L3. IMPRESSION: VASCULAR 1. 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 The bifurcated aortic stent graft is patent but the aneurysm sac is enlarging. Aneurysm sac measures up to 7.6 cm and measured 7.0 cm on 09/09/2021. 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 2. 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 3. Unusual location for a cardiac lead that appears to be extending through in interatrial defect and terminating in the left ventricle. This is a chronic finding. NON-VASCULAR 1. No acute abnormality in the abdomen or pelvis. 2. Atrophy in both kidneys. 3.  Aortic Atherosclerosis (ICD10-I70.0) and Emphysema (ICD10-J43.9). 4. Nonobstructive left nephrolithiasis. Electronically Signed   By: Markus Daft M.D.   On: 08/14/2022 15:29   CT CHEST  ABDOMEN PELVIS WO CONTRAST  Result Date: 08/14/2022 CLINICAL DATA:  Pneumonia, complications suspected. Low back pain onset 2 days ago worsening with movement. Shortness of breath. EXAM: CT CHEST, ABDOMEN AND PELVIS WITHOUT CONTRAST TECHNIQUE: Multidetector CT imaging of the chest, abdomen and pelvis was performed following the standard protocol without IV contrast. RADIATION DOSE REDUCTION: This exam was performed according to the departmental dose-optimization program which includes automated exposure control, adjustment of the mA and/or kV according to patient size and/or use of iterative reconstruction technique. COMPARISON:  PET-CT skull base to pelvis 09/09/2021, CT abdomen pelvis without contrast 03/31/2021. Most recent chest x-ray was portable chest today, portable chest 08/13/2022. FINDINGS: CT CHEST FINDINGS Cardiovascular: There is mild cardiomegaly. Metal artifact from left chest dual lead pacing system and dual lead wires in the heart. There are CABG changes. Aortic tortuosity and moderate patchy arthrosclerosis with aortic root ectasia up to 3.8 cm and ascending aorta with mild dilatation to 4.2 cm. The remainder is within normal caliber limits with scattered calcific plaque in the great vessels. There is a prominent pulmonary trunk 3.4 cm indicating arterial hypertension, unchanged. There are normal caliber pulmonary veins. Mediastinum/Nodes: No intrathoracic or axillary adenopathy. Thyroid gland obscured by metallic artifact from bilateral shoulder replacements and left chest pacemaker. Small amount of retained secretions at the right posterolateral tracheal wall, otherwise unremarkable trachea and main bronchi. Lungs/Pleura: Chronic changes in the bases and moderate emphysematous disease with both paraseptal and centrilobular changes. Right lower lobectomy with volume loss and mild asymmetric elevation of the right diaphragm. There is mild bronchial thickening. No pneumonic infiltrate or nodule is  seen. No pleural effusion, thickening or pneumothorax. Musculoskeletal: There is osteopenia, degenerative disc disease and spondylosis of the thoracic spine. As above there are bilateral shoulder replacements. CT ABDOMEN PELVIS FINDINGS Hepatobiliary: The liver is unremarkable without contrast. The gallbladder and bile ducts are unremarkable. Pancreas: No focal abnormality. Spleen: Chronic subcentimeter low-attenuation lesion in the central spleen. Probable cyst or hemangioma. Stable. Otherwise unremarkable without contrast. Adrenals/Urinary Tract: There is no adrenal mass. There is bilateral renal cortical thinning. Stable left renal cysts and 1 cm nonobstructive caliceal stone in the superior pole. 2 mm stone or renovascular calcification upper pole right kidney. Perinephric stranding is also similar. There is no ureteral stone or hydronephrosis. The right side of the bladder obscured by interval new right hip replacement. The visualized bladder normal in thickness. Stomach/Bowel: Small hiatal hernia. Contracted stomach with normal caliber unopacified small bowel. Normal appendix which is well visible. Mild-to-moderate stool retention in the colon including the rectum but no rectal wall thickening or inflammation. Scattered sigmoid diverticula without diverticulitis. Vascular/Lymphatic: Aortoiliac heavy calcific plaques are again noted with aorto bi-iliac stent graft. A large excluded aneurysm sac is again noted and is larger than previously. On the 2 prior studies, the aneurysmal sac measured 6.9 x 7.0 cm, today measuring 7.6 x 7.6 cm. 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 There are endovascular coils in the region of the right internal iliac artery which were noted previously. Reproductive: Old prostatectomy. Other: There are tiny umbilical and inguinal fat hernias. There is no incarcerated hernia. There is no free hemorrhage, free fluid or free  air, or inflammatory stranding around the AAA. Multiple pelvic phleboliths. Musculoskeletal: Chronic L5-S1 fusion  hardware is again noted with chronic L5 spondylolysis and grade 2 L5-S1 spondylolisthesis, unchanged. There is osteopenia degenerative change of the lumbar spine with discogenic mild grade 1 retrolisthesis unchanged at L1-2 and L2-3, slight dextroscoliosis. Interval right hip replacement. No acute hardware complications. No operative site hematoma. Ankylosis left SI joint. IMPRESSION: 1. COPD and mild chronic bronchitis with prior right lower lobectomy. No focal pneumonia is seen. Chronic prominence of the pulmonary trunk. 2. Aortic and coronary artery atherosclerosis, prior CABG, pacemaker, and abdominal aortobi-iliac stent grafting. 3. 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 Annual CTA or MRA follow-up recommended improved 4. 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 CTA would be the preferred imaging workup, MRA if the patient cannot have IV contrast. 5. Constipation and diverticulosis. 6. Nonobstructive nephrolithiasis. 7. Degenerative and postsurgical changes of the spine and osteopenia. No acute spinal compression fracture. Electronically Signed   By: Telford Nab M.D.   On: 08/14/2022 05:23   CT Head Wo Contrast  Result Date: 08/14/2022 CLINICAL DATA:  81 year old male with altered mental status. Increasing back pain with movement. EXAM: CT HEAD WITHOUT CONTRAST TECHNIQUE: Contiguous axial images were obtained from the base of the skull through the vertex without intravenous contrast. RADIATION DOSE REDUCTION: This exam was performed according to the departmental dose-optimization program which includes automated exposure control, adjustment of the mA and/or kV according to patient size and/or use of iterative reconstruction technique. COMPARISON:  Brain MRI 01/14/2010.  Head CT 06/10/2022. FINDINGS: Brain: Advanced  cerebral white matter disease and pronounced chronic lacunar infarcts in the left thalamus. Small chronic bilateral cerebellar infarcts. Stable cerebral volume. Patchy encephalomalacia and dystrophic calcification in the right parietal lobe is stable. Small area of chronic encephalomalacia in the right inferior frontal gyrus is stable. No midline shift, ventriculomegaly, mass effect, evidence of mass lesion, intracranial hemorrhage or evidence of cortically based acute infarction. Vascular: Calcified atherosclerosis at the skull base. No suspicious intracranial vascular hyperdensity. Skull: No acute osseous abnormality identified. Sinuses/Orbits: Mild sinus opacification in the left frontal, frontoethmoidal recess and sphenoid sinus have not significantly changed from last month. And overall paranasal sinuses and mastoids are well aerated. Other: No acute orbit or scalp soft tissue finding. IMPRESSION: 1. No acute intracranial abnormality identified. 2. Advanced chronic ischemic disease appears stable by CT since last month. Electronically Signed   By: Genevie Ann M.D.   On: 08/14/2022 04:54   DG Chest Portable 1 View  Result Date: 08/14/2022 CLINICAL DATA:  Shortness of breath, low back pain EXAM: PORTABLE CHEST 1 VIEW COMPARISON:  08/13/2022 FINDINGS: Mild patchy right lower lobe opacity, new, suspicious for pneumonia. Trace right pleural effusion versus chronic pleural thickening. Left lung is clear. No pneumothorax. Mild cardiomegaly. Postsurgical changes related to prior CABG. Left subclavian pacemaker. Median sternotomy. IMPRESSION: Mild patchy right lower lobe opacity, new, suspicious for pneumonia. Electronically Signed   By: Julian Hy M.D.   On: 08/14/2022 03:17            LOS: 1 day       Emeterio Reeve, DO [facility] 08/15/2022, 4:06 PM   Staff may message me via secure chat in [place]  but this may not receive immediate response,  please page for urgent matters!  If  7PM-7AM, please contact night-coverage [email or web address]  Dictation software was used to generate the above note. Typos may occur and escape review, as with typed/written notes. Please contact Dr Sheppard Coil directly  for clarity if needed.

## 2022-08-15 NOTE — Hospital Course (Addendum)
Rick Mcbride Rick Mcbride. is a 81 y.o. 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 Patient had surgery and finished SNF rehab. Currently he is doing PT/OT at home), chronic lower back pain (L5-S1 fusion), who presents with lower back pain and shortness breath. Pt had negative PE by V/Q scan which is ordered by his PCP day PTA, which was done for persistent SOB postoperatively. 10/07: WBC 3.8, negative COVID PCR, lactic acid 0.9, troponin level 31, 30, slightly worsening renal function with creatinine 2.69, BUN 31, GFR 23 (recent baseline creatinine 2.39 06/15/2022), temperature normal, blood pressure 172/84, 129/64, RR 83, heart rate 83, RR 17, oxygen saturation 98% on room air.  Chest x-ray showed possible mild patchy infiltration in left lower lobe.  CT head negative.  CT scan showed enlargement of AAA from previous 6.9 to 7.6 cm now.  Patient is admitted to telemetry bed as inpatient. Dr. Lorenso Courier of [facility] was consulted re: AAA, needs CTA, nephrology consulted and recommended IV fluids prior to CTA of Abd/Pelvis. Cardiology also consulted for surgical optimization/risk stratification - medically optimized at this time, troponin mild elevation likely d/t CKD4. BCx (+) E Faecalis, ID recommended r/o endocarditis/pacer infection w/ TEE and vascular surgery to assess re: graft involvement, changed Abx to ampicillin and repeat Bcx, also recommend MRI L-spine or CT w/ contrast, pt has pacemaker   10/08: Pt and wife agreeable to TEE and MRI, though [facility] would have to [facility]. Per vascular: "Noted bacteremia- however, patient with chronic  progressive lower back pain; expanding aortic sac may be contributory to pain- not definitive. Therefore, preferable to intervene sooner than later." Per ID, would consider in order of importance to 1) treat bacteremia, 2) address aorto-bifem leak, 3) TEE, 4) MRI/CT L-spine. IF we decide to do CT, this can be done in-house, vs RI would require transfer. Will plan to proceed w/ IV abx, do vascular procedure tomorrow as scheduled, and see how he progresses from there.  10/09: Vascular procedure today to fix graft.  Radiology reached out this morning, concern for small PE noted on CTA abdomen/pelvis.  Holding off on full CTA chest at this time due to concern with contrast/renal function but will plan for heparin. 10/10: Renal function okay. CT w/ contrast lumbar spine - no osteomyelitis noted. CTA chest no PE but Significant mucus within RIGHT mainstem bronchus extending into bronchus intermedius and RIGHT lower lobe. Continue IV fluids and recheck BMP in AM. TEE planned for 10/12.  10/11-17: TEE done on 10/17 was negative for any endocarditis. Pulmonology was also consulted for concern of mucous plugging, has been resolved.6 renal function seems stable. Repeat blood cultures from 08/15/2022 remains negative, Unable to rule out epidural abscess or as still as MRI cannot be done due to pacemaker and wife refused to go to [facility] for [facility].  Will continue ampicillin for 6 weeks. Neurosurgery is recommending conservative management with brace.  Avoid NSAID due to CKD. PT/OT are recommending SNF.  10/18: Infectious disease ordered WBC scan to rule out epidural abscess or discitis.  Continued to have lower back pain.  10/19: Continue to have lower back pain.  Scheduled for WBC  scan today.  Pending SNF placement-no bed offer yet.  10/20: WBC scan was negative for any concern of discitis or osteomyelitis.  Still no bed offer. Infectious disease to determine the duration of antibiotics now. Patient was  complaining of some worsening hoarseness of his voice for the past couple of days, no pain.  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  Advised to follow-up with ENT if he continues to have hoarseness.  Recently had his follow-up.  PET scan is due in December.  10/21: Remained hemodynamically stable.  ID recommendations remained for 6 weeks of ampicillin, until 09/27/2022.  We will continue IV while in the hospital and convert to p.o. amoxicillin 1 g Q8 hourly on discharge.  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

## 2022-08-16 ENCOUNTER — Encounter
Admission: EM | Disposition: A | Discharge status: Skilled Nursing Facility | Payer: Self-pay | Source: Home / Self Care | Attending: Internal Medicine

## 2022-08-16 ENCOUNTER — Inpatient Hospital Stay: Admit: 2022-08-16 | Payer: PPO

## 2022-08-16 DIAGNOSIS — R7881 Bacteremia: Secondary | ICD-10-CM | POA: Diagnosis not present

## 2022-08-16 DIAGNOSIS — G459 Transient cerebral ischemic attack, unspecified: Secondary | ICD-10-CM

## 2022-08-16 DIAGNOSIS — Z951 Presence of aortocoronary bypass graft: Secondary | ICD-10-CM

## 2022-08-16 DIAGNOSIS — M545 Low back pain, unspecified: Secondary | ICD-10-CM | POA: Diagnosis not present

## 2022-08-16 DIAGNOSIS — I2699 Other pulmonary embolism without acute cor pulmonale: Secondary | ICD-10-CM | POA: Insufficient documentation

## 2022-08-16 DIAGNOSIS — E782 Mixed hyperlipidemia: Secondary | ICD-10-CM

## 2022-08-16 DIAGNOSIS — I712 Thoracic aortic aneurysm, without rupture, unspecified: Secondary | ICD-10-CM

## 2022-08-16 DIAGNOSIS — N184 Chronic kidney disease, stage 4 (severe): Secondary | ICD-10-CM | POA: Diagnosis not present

## 2022-08-16 DIAGNOSIS — I723 Aneurysm of iliac artery: Secondary | ICD-10-CM | POA: Diagnosis not present

## 2022-08-16 DIAGNOSIS — B952 Enterococcus as the cause of diseases classified elsewhere: Secondary | ICD-10-CM

## 2022-08-16 DIAGNOSIS — G8929 Other chronic pain: Secondary | ICD-10-CM

## 2022-08-16 DIAGNOSIS — T829XXA Unspecified complication of cardiac and vascular prosthetic device, implant and graft, initial encounter: Secondary | ICD-10-CM

## 2022-08-16 DIAGNOSIS — C01 Malignant neoplasm of base of tongue: Secondary | ICD-10-CM

## 2022-08-16 DIAGNOSIS — F419 Anxiety disorder, unspecified: Secondary | ICD-10-CM

## 2022-08-16 DIAGNOSIS — D509 Iron deficiency anemia, unspecified: Secondary | ICD-10-CM

## 2022-08-16 DIAGNOSIS — T82330A Leakage of aortic (bifurcation) graft (replacement), initial encounter: Secondary | ICD-10-CM | POA: Diagnosis not present

## 2022-08-16 DIAGNOSIS — I714 Abdominal aortic aneurysm, without rupture, unspecified: Secondary | ICD-10-CM | POA: Diagnosis not present

## 2022-08-16 HISTORY — PX: AORTIC INTERVENTION: CATH118225

## 2022-08-16 LAB — BASIC METABOLIC PANEL
Anion gap: 7 (ref 5–15)
BUN: 30 mg/dL — ABNORMAL HIGH (ref 8–23)
CO2: 22 mmol/L (ref 22–32)
Calcium: 8.6 mg/dL — ABNORMAL LOW (ref 8.9–10.3)
Chloride: 109 mmol/L (ref 98–111)
Creatinine, Ser: 2.1 mg/dL — ABNORMAL HIGH (ref 0.61–1.24)
GFR, Estimated: 31 mL/min — ABNORMAL LOW (ref >60)
Glucose, Bld: 106 mg/dL — ABNORMAL HIGH (ref 70–99)
Potassium: 4.5 mmol/L (ref 3.5–5.1)
Sodium: 138 mmol/L (ref 135–145)

## 2022-08-16 LAB — CBC
HCT: 25.4 % — ABNORMAL LOW (ref 39.0–52.0)
Hemoglobin: 7.9 g/dL — ABNORMAL LOW (ref 13.0–17.0)
MCH: 28.3 pg (ref 26.0–34.0)
MCHC: 31.1 g/dL (ref 30.0–36.0)
MCV: 91 fL (ref 80.0–100.0)
Platelets: 155 10*3/uL (ref 150–400)
RBC: 2.79 MIL/uL — ABNORMAL LOW (ref 4.22–5.81)
RDW: 15.6 % — ABNORMAL HIGH (ref 11.5–15.5)
WBC: 5.4 10*3/uL (ref 4.0–10.5)
nRBC: 0 % (ref 0.0–0.2)

## 2022-08-16 LAB — URINE CULTURE: Culture: >=100000 — AB

## 2022-08-16 SURGERY — AORTIC INTERVENTION
Anesthesia: Moderate Sedation

## 2022-08-16 MED ORDER — FENTANYL CITRATE (PF) 100 MCG/2ML IJ SOLN
INTRAMUSCULAR | Status: DC | PRN
  Administered 2022-08-16: 50 ug via INTRAVENOUS

## 2022-08-16 MED ORDER — HEPARIN SODIUM (PORCINE) 1000 UNIT/ML IJ SOLN
INTRAMUSCULAR | Status: DC | PRN
  Administered 2022-08-16: 4000 [IU] via INTRAVENOUS

## 2022-08-16 MED ORDER — HEPARIN SODIUM (PORCINE) 1000 UNIT/ML IJ SOLN
INTRAMUSCULAR | Status: AC
  Filled 2022-08-16: qty 10

## 2022-08-16 MED ORDER — MIDAZOLAM HCL 2 MG/2ML IJ SOLN
INTRAMUSCULAR | Status: AC
  Filled 2022-08-16: qty 2

## 2022-08-16 MED ORDER — HYDROMORPHONE HCL 1 MG/ML IJ SOLN
INTRAMUSCULAR | Status: AC
  Administered 2022-08-16: 1 mg via INTRAVENOUS
  Filled 2022-08-16: qty 1

## 2022-08-16 MED ORDER — HYDROMORPHONE HCL 1 MG/ML IJ SOLN
1.0000 mg | INTRAMUSCULAR | Status: AC | PRN
  Administered 2022-08-16: 1 mg via INTRAVENOUS
  Filled 2022-08-16: qty 1

## 2022-08-16 MED ORDER — IODIXANOL 320 MG/ML IV SOLN
INTRAVENOUS | Status: DC | PRN
  Administered 2022-08-16: 35 mL

## 2022-08-16 MED ORDER — SODIUM CHLORIDE 0.9 % IV SOLN
2.0000 g | Freq: Two times a day (BID) | INTRAVENOUS | Status: DC
  Administered 2022-08-17 – 2022-08-19 (×6): 2 g via INTRAVENOUS
  Filled 2022-08-16: qty 20
  Filled 2022-08-16 (×2): qty 2
  Filled 2022-08-16 (×5): qty 20

## 2022-08-16 MED ORDER — MIDAZOLAM HCL 2 MG/2ML IJ SOLN
INTRAMUSCULAR | Status: DC | PRN
  Administered 2022-08-16: 1 mg via INTRAVENOUS
  Administered 2022-08-16: .5 mg via INTRAVENOUS

## 2022-08-16 MED ORDER — FENTANYL CITRATE PF 50 MCG/ML IJ SOSY
PREFILLED_SYRINGE | INTRAMUSCULAR | Status: AC
  Filled 2022-08-16: qty 1

## 2022-08-16 SURGICAL SUPPLY — 18 items
CATH TEMPO 5F RIM 65CM (CATHETERS) ×1
COIL AXIUM FRAME 3X10 (Vascular Products) ×1 IMPLANT
COIL BARE HELIX 4X10(SS) (Vascular Products) ×1 IMPLANT
COIL BARE HELIX 4X12(SS) (Vascular Products) ×2 IMPLANT
COIL BARE HELIX 5X15(SS) (Vascular Products) ×3 IMPLANT
COIL BARE HELIX 5X20(SS) (Vascular Products) ×1 IMPLANT
COIL BARE HX 3X10(ES) (Vascular Products) ×2 IMPLANT
COVER PROBE ULTRASOUND 5X96 (MISCELLANEOUS) ×1
DEVICE SAFEGUARD 24CM (GAUZE/BANDAGES/DRESSINGS) ×1
DEVICE STARCLOSE SE CLOSURE (Vascular Products) ×1 IMPLANT
GLIDEWIRE STIFF .35X180X3 HYDR (WIRE) ×1
MICROCATH ECHELON 14 (CATHETERS) ×1
MICROCATH PROGREAT 2.8F 130CM (MICROCATHETER) ×1
SHEATH BRITE TIP 5FRX11 (SHEATH) ×1
WIRE ASAHI PROWATER 180CM (WIRE) ×1
WIRE GUIDERIGHT .035X150 (WIRE) ×1
WIRE HI TORQ WHISPER MS 300CM (WIRE) ×1
WIRE RUNTHROUGH .014X300CM (WIRE) ×1

## 2022-08-16 NOTE — Progress Notes (Signed)
[place], [place] 08/16/22  Subjective:   Hospital day # 2  Patient known to our practice from outpatient follow-up of CKD, with Dr Candiss Norse.  Patient seen resting quietly, family at bedside. Appears more relaxed today than yesterday Room air No lower extremity edema     10/08 0701 - 10/09 0700 In: 1839.8 [I.V.:1439.8; IV Piggyback:400] Out: 1250 [Urine:1250] Lab Results  Component Value Date   CREATININE 2.10 (H) 08/16/2022   CREATININE 2.33 (H) 08/15/2022   CREATININE 2.69 (H) 08/14/2022     Objective:  Vital signs in last 24 hours:  Temp:  [97.5 F (36.4 C)-98.7 F (37.1 C)] 97.8 F (36.6 C) (10/09 1255) Pulse Rate:  [57-71] 64 (10/09 1255) Resp:  [13-20] 17 (10/09 1255) BP: (127-156)/(66-85) 145/78 (10/09 1255) SpO2:  [96 %-100 %] 96 % (10/09 1255) Weight:  [86.1 kg] 86.1 kg (10/09 0226)  Weight change: 0.7 kg Filed Weights   08/14/22 0232 08/15/22 0500 08/16/22 0226  Weight: 99.8 kg 85.4 kg 86.1 kg    Intake/Output:    Intake/Output Summary (Last 24 hours) at 08/16/2022 1514 Last data filed at 08/16/2022 1300 Gross per 24 hour  Intake 1839.78 ml  Output 1200 ml  Net 639.78 ml      Physical Exam: General: elderly gentleman, laying in the bed  HEENT Anicteric, moist oral mucous membranes  Pulm/lungs Normal breathing effort, clear to auscultation  CVS/Heart No rub or gallop  Abdomen:  Soft, nontender  Extremities: No peripheral edema  Neurologic: Alert, oriented, pain with movement of right leg  Skin: No acute rashes          Basic Metabolic Panel:  Recent Labs  Lab 08/14/22 0247 08/15/22 0440 08/16/22 0403  NA 137 137 138  K 4.6 4.3 4.5  CL 103 108 109  CO2 24 24 22   GLUCOSE 104* 104* 106*  BUN 31* 32* 30*  CREATININE 2.69* 2.33* 2.10*  CALCIUM 9.1 8.7* 8.6*      CBC: Recent Labs  Lab 08/14/22 0247 08/15/22 0440 08/16/22 0403  WBC 3.8* 4.4 5.4  HGB 8.6* 7.8* 7.9*  HCT 28.6* 25.5* 25.4*  MCV  92.9 90.7 91.0  PLT 136* 149* 155      No results found for: "HEPBSAG", "HEPBSAB", "HEPBIGM"    Microbiology:  Recent Results (from the past 240 hour(s))  Culture, blood (routine x 2)     Status: Abnormal (Preliminary result)   Collection Time: 08/14/22  3:57 AM   Specimen: BLOOD  Result Value Ref Range Status   Specimen Description   Final    BLOOD BLOOD RIGHT ARM Performed at [facility], [address], [place], [place] [address]    Special Requests   Final    BOTTLES DRAWN AEROBIC AND ANAEROBIC Blood Culture adequate volume Performed at [facility], [address] [place], [place] [address]    Culture  Setup Time   Final    Organism ID to follow [place] TO, READ BACK BY AND VERIFIED WITH: PHARMD CHILDS AT [place] 08/14/2022 GAA Performed at [facility], [address], [place], [place] [address]    Culture (A)  Final    ENTEROCOCCUS FAECALIS SUSCEPTIBILITIES TO FOLLOW Performed at [facility], [address], [place], [place] [address]    Report Status PENDING  Incomplete  Culture, blood (routine x 2)     Status: Abnormal (Preliminary result)   Collection Time:  08/14/22  3:57 AM   Specimen: BLOOD  Result Value Ref Range Status   Specimen Description   Final    BLOOD BLOOD LEFT ARM Performed at [facility], [address], [place], [place] [address]    Special Requests   Final    BOTTLES DRAWN AEROBIC AND ANAEROBIC Blood Culture adequate volume Performed at [facility], [address] [place], [place] [address]    Culture  Setup Time   Final    GRAM POSITIVE COCCI AEROBIC BOTTLE ONLY CRITICAL RESULT CALLED TO, READ BACK BY AND VERIFIED WITH: PHARMD CHILDS AT 1957 08/14/2022 GAA GRAM STAIN REVIEWED-AGREE WITH RESULT Performed at [facility], [address], [place], [place] [address]    Culture ENTEROCOCCUS FAECALIS  (A)  Final   Report Status PENDING  Incomplete  Resp Panel by RT-PCR (Flu A&B, Covid) Anterior Nasal Swab     Status: None   Collection Time: 08/14/22  3:57 AM   Specimen: Anterior Nasal Swab  Result Value Ref Range Status   SARS Coronavirus 2 by RT PCR NEGATIVE NEGATIVE Final    Comment: (NOTE) SARS-CoV-2 target nucleic acids are NOT DETECTED.  The SARS-CoV-2 RNA is generally detectable in upper respiratory specimens during the acute phase of infection. The lowest concentration of SARS-CoV-2 viral copies this assay can detect is 138 copies/mL. A negative result does not preclude SARS-Cov-2 infection and should not be used as the sole basis for treatment or other patient management decisions. A negative result may occur with  improper specimen collection/handling, submission of specimen other than nasopharyngeal swab, presence of viral mutation(s) within the areas targeted by this assay, and inadequate number of viral copies(<138 copies/mL). A negative result must be combined with clinical observations, patient history, and epidemiological information. The expected result is Negative.  Fact Sheet for Patients:  EntrepreneurPulse.com.au  Fact Sheet for Healthcare Providers:  IncredibleEmployment.be  This test is no t yet approved or cleared by the [place] FDA and  has been authorized for detection and/or diagnosis of SARS-CoV-2 by FDA under an Emergency Use Authorization (EUA). This EUA will remain  in effect (meaning this test can be used) for the duration of the COVID-19 declaration under Section 564(b)(1) of the Act, 21 U.S.C.section 360bbb-3(b)(1), unless the authorization is terminated  or revoked sooner.       Influenza A by PCR NEGATIVE NEGATIVE Final   Influenza B by PCR NEGATIVE NEGATIVE Final    Comment: (NOTE) The Xpert Xpress SARS-CoV-2/FLU/RSV plus assay is intended as an aid in the diagnosis of influenza from  Nasopharyngeal swab specimens and should not be used as a sole basis for treatment. Nasal washings and aspirates are unacceptable for Xpert Xpress SARS-CoV-2/FLU/RSV testing.  Fact Sheet for Patients: EntrepreneurPulse.com.au  Fact Sheet for Healthcare Providers: IncredibleEmployment.be  This test is not yet approved or cleared by the [place] FDA and has been authorized for detection and/or diagnosis of SARS-CoV-2 by FDA under an Emergency Use Authorization (EUA). This EUA will remain in effect (meaning this test can be used) for the duration of the COVID-19 declaration under Section 564(b)(1) of the Act, 21 U.S.C. section 360bbb-3(b)(1), unless the authorization is terminated or revoked.  Performed at [facility], [address] [place],  [address]   Blood Culture ID Panel (Reflexed)     Status: Abnormal   Collection Time: 08/14/22  3:57 AM  Result Value Ref Range Status   Enterococcus faecalis DETECTED (A) NOT DETECTED Final  Comment: CRITICAL RESULT CALLED TO, READ BACK BY AND VERIFIED WITH: PHARMD CHILDS AT 1957 08/14/2022 GAA    Enterococcus Faecium NOT DETECTED NOT DETECTED Final   Listeria monocytogenes NOT DETECTED NOT DETECTED Final   Staphylococcus species NOT DETECTED NOT DETECTED Final   Staphylococcus aureus (BCID) NOT DETECTED NOT DETECTED Final   Staphylococcus epidermidis NOT DETECTED NOT DETECTED Final   Staphylococcus lugdunensis NOT DETECTED NOT DETECTED Final   Streptococcus species NOT DETECTED NOT DETECTED Final   Streptococcus agalactiae NOT DETECTED NOT DETECTED Final   Streptococcus pneumoniae NOT DETECTED NOT DETECTED Final   Streptococcus pyogenes NOT DETECTED NOT DETECTED Final   A.calcoaceticus-baumannii NOT DETECTED NOT DETECTED Final   Bacteroides fragilis NOT DETECTED NOT DETECTED Final   Enterobacterales NOT DETECTED NOT DETECTED Final   Enterobacter cloacae complex NOT DETECTED NOT  DETECTED Final   Escherichia coli NOT DETECTED NOT DETECTED Final   Klebsiella aerogenes NOT DETECTED NOT DETECTED Final   Klebsiella oxytoca NOT DETECTED NOT DETECTED Final   Klebsiella pneumoniae NOT DETECTED NOT DETECTED Final   Proteus species NOT DETECTED NOT DETECTED Final   Salmonella species NOT DETECTED NOT DETECTED Final   Serratia marcescens NOT DETECTED NOT DETECTED Final   Haemophilus influenzae NOT DETECTED NOT DETECTED Final   Neisseria meningitidis NOT DETECTED NOT DETECTED Final   Pseudomonas aeruginosa NOT DETECTED NOT DETECTED Final   Stenotrophomonas maltophilia NOT DETECTED NOT DETECTED Final   Candida albicans NOT DETECTED NOT DETECTED Final   Candida auris NOT DETECTED NOT DETECTED Final   Candida glabrata NOT DETECTED NOT DETECTED Final   Candida krusei NOT DETECTED NOT DETECTED Final   Candida parapsilosis NOT DETECTED NOT DETECTED Final   Candida tropicalis NOT DETECTED NOT DETECTED Final   Cryptococcus neoformans/gattii NOT DETECTED NOT DETECTED Final   Vancomycin resistance NOT DETECTED NOT DETECTED Final    Comment: Performed at [facility], [address], [place], [place] [address]  Urine Culture     Status: Abnormal   Collection Time: 08/14/22  6:54 AM   Specimen: Urine, Clean Catch  Result Value Ref Range Status   Specimen Description   Final    URINE, CLEAN CATCH Performed at [facility], [address] [place], [place] [address]    Special Requests   Final    NONE Performed at [facility], [address] [place], [place] [address]    Culture >=100,000 COLONIES/mL ENTEROCOCCUS FAECALIS (A)  Final   Report Status 08/16/2022 FINAL  Final   Organism ID, Bacteria ENTEROCOCCUS FAECALIS (A)  Final      Susceptibility   Enterococcus faecalis - MIC*    AMPICILLIN <=2 SENSITIVE Sensitive     NITROFURANTOIN <=16 SENSITIVE Sensitive     VANCOMYCIN 1 SENSITIVE Sensitive     * >=100,000 COLONIES/mL ENTEROCOCCUS FAECALIS   Culture, blood (Routine X 2) w Reflex to ID Panel     Status: None (Preliminary result)   Collection Time: 08/15/22 12:22 PM   Specimen: Right Antecubital; Blood  Result Value Ref Range Status   Specimen Description RIGHT ANTECUBITAL  Final   Special Requests   Final    BOTTLES DRAWN AEROBIC AND ANAEROBIC Blood Culture adequate volume   Culture   Final    NO GROWTH < 24 HOURS Performed at [facility], [address] [place], [place] [address]    Report Status PENDING  Incomplete  Culture, blood (Routine X 2) w Reflex to ID Panel     Status: None (  Preliminary result)   Collection Time: 08/15/22 12:22 PM   Specimen: BLOOD RIGHT ARM  Result Value Ref Range Status   Specimen Description BLOOD RIGHT ARM  Final   Special Requests   Final    BOTTLES DRAWN AEROBIC AND ANAEROBIC Blood Culture adequate volume   Culture   Final    NO GROWTH < 24 HOURS Performed at [facility], [address] [place], [place] [address]    Report Status PENDING  Incomplete    Coagulation Studies: Recent Labs    08/14/22 1958  LABPROT 14.5  INR 1.1     Urinalysis: Recent Labs    08/14/22 0654  COLORURINE YELLOW*  LABSPEC 1.017  PHURINE 6.0  GLUCOSEU NEGATIVE  HGBUR NEGATIVE  BILIRUBINUR NEGATIVE  KETONESUR NEGATIVE  PROTEINUR 30*  NITRITE NEGATIVE  LEUKOCYTESUR NEGATIVE       Imaging: No results found.   Medications:    sodium chloride 75 mL/hr at 08/16/22 0223   [MAR Hold] ampicillin (OMNIPEN) IV 2 g (08/16/22 0542)   [MAR Hold] cefTRIAXone (ROCEPHIN)  IV      [MAR Hold] atorvastatin  40 mg Oral QHS   [MAR Hold] azithromycin  250 mg Oral Daily   [MAR Hold] calcitRIOL  0.25 mcg Oral q AM   [MAR Hold] docusate sodium  300 mg Oral QPC lunch   [MAR Hold] donepezil  10 mg Oral QHS   [MAR Hold] ezetimibe  10 mg Oral QPM   fentaNYL       [MAR Hold] ferrous sulfate  325 mg Oral Q2000   heparin sodium (porcine)       [MAR Hold] ipratropium  2-4 spray Each  Nare QHS   [MAR Hold] lidocaine  1 patch Transdermal Q24H   [MAR Hold] loratadine  10 mg Oral Daily   [MAR Hold] melatonin  10 mg Oral QHS   [MAR Hold] methocarbamol  500 mg Oral Q2000   [MAR Hold] metoprolol succinate  25 mg Oral Daily   midazolam       [MAR Hold] multivitamin with minerals  1 tablet Oral QPC lunch   [MAR Hold] pantoprazole  40 mg Oral QAC breakfast   [MAR Hold] predniSONE  40 mg Oral Q breakfast   [MAR Hold] traMADol  50 mg Oral QID   [MAR Hold] zolpidem  10 mg Oral QHS   [MAR Hold] acetaminophen, [MAR Hold] albuterol, [MAR Hold] ALPRAZolam, [MAR Hold] dextromethorphan-guaiFENesin, fentaNYL, fentaNYL, heparin sodium (porcine), heparin sodium (porcine), [MAR Hold] hydrALAZINE, [MAR Hold]  HYDROmorphone (DILAUDID) injection, [MAR Hold] methocarbamol, midazolam, midazolam, [MAR Hold] ondansetron (ZOFRAN) IV, [MAR Hold] oxyCODONE-acetaminophen  Assessment/ Plan:  81 y.o. 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  Urinalysis shows small proteinuria.  Concern about IV contrast exposure causing worsening of kidney function.  Overall this is a difficult situation.  Patient does require proper assessment of aorta (which necessitates IV contrast exposure) prior to planning endovascular repair. 2D echo from 06/14/2022 shows LVEF 60 to 76%, grade 1 diastolic dysfunction  Plan: Creatinine remains  at  baseline with adequate urine output.  It was discussed that recommended treatments involved IV contrast exposure which may affect and decreased renal function.  Patient understands and wants to proceed with any needed procedures.  We will continue to monitor renal function.    LOS: 2 Colon [place] 10/9/20233:14 PM  [place], [place]  Note: This note was prepared with Dragon dictation. Any transcription errors are unintentional

## 2022-08-16 NOTE — Interval H&P Note (Signed)
History and Physical Interval Note:  08/16/2022 1:10 PM  Rick Mcbride.  has presented today for surgery, with the diagnosis of Type II endoleak.  The various methods of treatment have been discussed with the patient and family. After consideration of risks, benefits and other options for treatment, the patient has consented to  Procedure(s): AORTIC INTERVENTION Type II endoleak (N/A) as a surgical intervention.  The patient's history has been reviewed, patient examined, no change in status, stable for surgery.  I have reviewed the patient's chart and labs.  Questions were answered to the patient's satisfaction.     [name]

## 2022-08-16 NOTE — Consult Note (Signed)
NAME: Rick Mcbride.  DOB: 1941/03/29  MRN: [id number]  Date/Time: 08/16/2022 10:03 AM  REQUESTING PROVIDER: Emeterio Reeve Subjective:  REASON FOR CONSULT: enterococcus bacteremia ? Rick Mcbride. is a 81 y.o. 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 HE fell on 06/09/22 and came to the ED and noted to have rt femoral fracture- on 06/10/22 he underwent  rt hip bipolar hemiarthroplasty for displaced femoral neck fracture by Dr.Poggi- [facility] and stayed there till 07/23/22 As per wife he did not get much PT like she expected .  HE was getting home PT as well- the past few days the baseline low back pain got worse. HE was also feeling short of breath for a few days - for which his PCP ordered VQ scan. no fever or chills, no difficulty in passing urine In the ED vitals were  08/14/22  BP 139/64  Temp 98.1 F (36.7 C)  Pulse Rate 59 !  Resp 16  SpO2 99 %   Labs revealed  Latest Reference Range & Units 08/14/22  WBC 4.0 - 10.5 K/uL 3.8 (L)  Hemoglobin 13.0 - 17.0 g/dL 8.6 (L)  HCT 39.0 - 52.0 % 28.6 (L)  Platelets 150 - 400 K/uL 136 (L)  Creatinine 0.61 - 1.24 mg/dL 2.69 (H)   Blood culture sent CTA of chest  abdomen and pelvis revealed small PE left upper lobe pulmonary artery. There was endovascular repair of an abd aortic aneurysm  with a bifurcated infrarenal aortic stent graft which was patent. The aneurysmal  sac was enlarging measuring 7.6 cm . Evidence of type 2 endoleak  Pt blood culture came positive for enterococcus and he was started on ampicillin  and I am seeing the patient for the same PT was taken for endovascular procedure today and underwent subselective and selective left hypogastric artery injections all the way out to lumbar branches Coil embolization of the lumbar artery branches and the tertiary left hypogastric branch with 10 coils   Past Medical History:  Diagnosis Date   AAA (abdominal aortic aneurysm) ([place])    a.) s/p EVAR 01/05/2017. b.) native aneurysm sac 6.6 x 6.9 cm by CT on 03/31/2021   Abnormality of tongue    a.) CT head/neck 07/10/2021 --> asymmetric soft tissue at the RIGHT tongue base with a superficial 8 mm lesion.   Anemia    Aneurysm of right common iliac artery (HCC)    a.) measured 2.7 cm by CT on 03/31/2021   Anxiety    Aortic atherosclerosis (HCC)    Atrophic kidney    B12 deficiency    CAD (coronary artery disease)    Cervical radiculopathy    Chronic airway obstruction (HCC)    Chronic kidney disease (CKD), stage III (moderate) (HCC)    Chronic pain syndrome 06/16/2021   Chronic right shoulder pain 11/12/2019   Chronic tension headaches    Chronic, continuous use of opioids 06/16/2021   Coronary artery disease    DDD (degenerative disc disease), lumbar  Degenerative disc disease, lumbar    with lumbar radiculopathy   Elbow fracture, left    GERD (gastroesophageal reflux disease)    H/O adenomatous polyp of colon    H/O hemorrhoids    HTN (hypertension)    Hyperlipidemia    Meralgia paresthetica    Mild cognitive impairment    Nephrolithiasis    Neuralgia    Numbness of right foot 02/19/2021   Osteoarthritis    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   Presence of permanent cardiac pacemaker    Prostate cancer [facility])    a.) s/p prostatectomy   S/P CABG x 3 05/01/2004   a.) LVEF 40-49%; LIMA-LAD, SVG-OM1, SVG-PDA   Second degree AV block    Sinoatrial node dysfunction (HCC)    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    Past Surgical History:  Procedure Laterality Date   CATARACT EXTRACTION Bilateral    COLONOSCOPY     COLONOSCOPY     COLONOSCOPY WITH PROPOFOL N/A 10/20/2015   Procedure: COLONOSCOPY WITH PROPOFOL;  Surgeon: Manya Silvas, MD;  Location: [facility] ENDOSCOPY;  Service: Endoscopy;  Laterality: N/A;   COLONOSCOPY WITH PROPOFOL N/A 11/28/2020   Procedure: COLONOSCOPY WITH PROPOFOL;  Surgeon: Robert Bellow, MD;  Location: [facility] ENDOSCOPY;  Service: Endoscopy;  Laterality: N/A;   CORONARY ARTERY BYPASS GRAFT N/A 05/01/2004   Procedure: 3v CABG (LIMA-LAD, SVG-OM1, SVG-PDA); Location: [facility]; Surgeon: Ander Gaster, MD   EMBOLIZATION Right 12/27/2016   Procedure: Embolization;  Surgeon: Algernon Huxley, MD;  Location: [place] CV LAB;  Service: Cardiovascular;  Laterality: Right;   ENDOVASCULAR REPAIR/STENT GRAFT N/A 01/05/2017   Procedure: Endovascular Repair/Stent Graft;  Surgeon: Algernon Huxley, MD;  Location: [place] CV LAB;  Service: Cardiovascular;  Laterality: N/A;   HIP ARTHROPLASTY Right 06/10/2022   Procedure: ARTHROPLASTY BIPOLAR HIP (HEMIARTHROPLASTY);  Surgeon: [name], MD;  Location: [facility] ORS;  Service: Orthopedics;  Laterality: Right;   INSERT / REPLACE / REMOVE PACEMAKER     JOINT REPLACEMENT     shoulder and knees   KNEE ARTHROSCOPY     LUNG LOBECTOMY Right 01/31/2013   Procedure: RIGHT PULMONARY LOBECTOMY; Location: [place]; Surgeon: Nestor Lewandowsky, MD   MICROLARYNGOSCOPY Right 08/10/2021   Procedure: MICRODIRECT LARYNGOSCOPY WITH BIOPSY OF TONGUE BASE;  Surgeon: Beverly Gust, MD;  Location: [facility] ORS;  Service: ENT;  Laterality: Right;   PACEMAKER INSERTION  12/2012   Dual chanber pacemaker generator    POLYPECTOMY     POSTERIOR LUMBAR FUSION     Procedure: POSTERIOR LUMBAR INTERBODY FUSION, INTERBODY PROSTHESIS, POSTERIOR LATERAL ARTHRODESIS, POSTERIOR NON-SEGMENTAL INSTRUMENTATION LUMBAR FIVE- SACRAL ONE; Location: [facility]; Surgeon: Newman Pies, MD   PROSTATECTOMY     TONGUE BIOPSY N/A 08/10/2021   Procedure: TONGUE BIOPSY;  Surgeon: Beverly Gust, MD;  Location: [facility] ORS;  Service: ENT;  Laterality: N/A;   TOTAL KNEE ARTHROPLASTY Bilateral    TOTAL SHOULDER ARTHROPLASTY Left 07/10/2015   Procedure: TOTAL SHOULDER ARTHROPLASTY;  Surgeon: [name], MD;  Location: [facility] ORS;  Service: Orthopedics;  Laterality: Left;   TOTAL SHOULDER REPLACEMENT  Social History   Socioeconomic History   Marital status: Married    Spouse name: Not on file   Number of children: 2   Years of education: 16   Highest education level: Not on file  Occupational History   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   Patient is right handed.      Live at home with wife   Social Determinants of Health   Financial Resource Strain: Not on file  Food Insecurity: No Food Insecurity (08/14/2022)   Hunger Vital Sign    Worried About Running Out of Food in the Last Year: Never true    Ran Out of Food in the Last Year: Never true  Transportation Needs: No Transportation Needs (08/14/2022)   PRAPARE - Hydrologist (Medical): No    Lack of Transportation (Non-Medical): No  Physical Activity: Not on file   Stress: Not on file  Social Connections: Not on file  Intimate Partner Violence: Not At Risk (08/14/2022)   Humiliation, Afraid, Rape, and Kick questionnaire    Fear of Current or Ex-Partner: No    Emotionally Abused: No    Physically Abused: No    Sexually Abused: No    Family History  Problem Relation Age of Onset   Heart attack Mother    Heart attack Father    Breast cancer Sister    Asthma Sister    No Known Allergies I? Current Facility-Administered Medications  Medication Dose Route Frequency Provider Last Rate Last Admin   0.9 %  sodium chloride infusion   Intravenous Continuous Colon Flattery, NP 75 mL/hr at 08/16/22 0223 New Bag at 08/16/22 0223   acetaminophen (TYLENOL) tablet 650 mg  650 mg Oral Q6H PRN Ivor Costa, MD   650 mg at 08/14/22 2335   albuterol (PROVENTIL) (2.5 MG/3ML) 0.083% nebulizer solution 2.5 mg  2.5 mg Nebulization Q4H PRN Ivor Costa, MD       ALPRAZolam Duanne Moron) tablet 0.25 mg  0.25 mg Oral BID PRN Ivor Costa, MD   0.25 mg at 08/15/22 1035   ampicillin (OMNIPEN) 2 g in sodium chloride 0.9 % 100 mL IVPB  2 g Intravenous Q8H Mickeal Skinner A, RPH 300 mL/hr at 08/16/22 0542 2 g at 08/16/22 0542   atorvastatin (LIPITOR) tablet 40 mg  40 mg Oral QHS Ivor Costa, MD   40 mg at 08/15/22 2153   azithromycin (ZITHROMAX) tablet 250 mg  250 mg Oral Daily Ivor Costa, MD   250 mg at 08/15/22 9735   calcitRIOL (ROCALTROL) capsule 0.25 mcg  0.25 mcg Oral q AM Ivor Costa, MD   0.25 mcg at 08/15/22 0919   dextromethorphan-guaiFENesin (MUCINEX DM) 30-600 MG per 12 hr tablet 1 tablet  1 tablet Oral BID PRN Ivor Costa, MD       docusate sodium (COLACE) capsule 300 mg  300 mg Oral  QPC lunch Ivor Costa, MD   300 mg at 08/15/22 1516   donepezil (ARICEPT) tablet 10 mg  10 mg Oral QHS Ivor Costa, MD   10 mg at 08/15/22 2152   ezetimibe (ZETIA) tablet 10 mg  10 mg Oral QPM Ivor Costa, MD   10 mg at 08/15/22 2152   ferrous sulfate tablet 325 mg  325 mg Oral Q2000 Ivor Costa, MD    325 mg at 08/15/22 2153   hydrALAZINE (APRESOLINE) injection 5 mg  5 mg Intravenous Q2H PRN Ivor Costa, MD       HYDROmorphone (DILAUDID) injection 1 mg  1 mg Intravenous Q4H PRN Emeterio Reeve, DO   1 mg at 08/16/22 0735   ipratropium (ATROVENT) 0.06 % nasal spray 2-4 spray  2-4 spray Each Nare QHS Ivor Costa, MD   2 spray at 08/15/22 2153   lidocaine (LIDODERM) 5 % 1 patch  1 patch Transdermal Q24H Ivor Costa, MD   1 patch at 08/16/22 0930   loratadine (CLARITIN) tablet 10 mg  10 mg Oral Daily Ivor Costa, MD   10 mg at 08/15/22 2153   melatonin tablet 10 mg  10 mg Oral QHS Ivor Costa, MD   10 mg at 08/15/22 2152   methocarbamol (ROBAXIN) tablet 500 mg  500 mg Oral Q8H PRN Ivor Costa, MD   500 mg at 08/14/22 1511   methocarbamol (ROBAXIN) tablet 500 mg  500 mg Oral Q2000 Ivor Costa, MD   500 mg at 08/15/22 2153   metoprolol succinate (TOPROL-XL) 24 hr tablet 25 mg  25 mg Oral Daily Paraschos, Alexander, MD   25 mg at 08/16/22 0930   multivitamin with minerals tablet 1 tablet  1 tablet Oral QPC lunch Ivor Costa, MD   1 tablet at 08/15/22 1516   ondansetron (ZOFRAN) injection 4 mg  4 mg Intravenous Q8H PRN Ivor Costa, MD       oxyCODONE-acetaminophen (PERCOCET/ROXICET) 5-325 MG per tablet 1 tablet  1 tablet Oral Q6H PRN Ivor Costa, MD   1 tablet at 08/15/22 1957   pantoprazole (PROTONIX) EC tablet 40 mg  40 mg Oral QAC breakfast Ivor Costa, MD   40 mg at 08/15/22 0919   predniSONE (DELTASONE) tablet 40 mg  40 mg Oral Q breakfast Ivor Costa, MD   40 mg at 08/15/22 0919   traMADol (ULTRAM) tablet 50 mg  50 mg Oral QID Ivor Costa, MD   50 mg at 08/16/22 0930   zolpidem (AMBIEN) tablet 10 mg  10 mg Oral QHS Ivor Costa, MD   10 mg at 08/15/22 2152     Abtx:  Anti-infectives (From admission, onward)    Start     Dose/Rate Route Frequency Ordered Stop   08/15/22 1000  azithromycin (ZITHROMAX) tablet 250 mg       See Hyperspace for full Linked Orders Report.   250 mg Oral Daily 08/14/22 0822  08/19/22 0959   08/14/22 2200  ampicillin (OMNIPEN) 2 g in sodium chloride 0.9 % 100 mL IVPB        2 g 300 mL/hr over 20 Minutes Intravenous Every 8 hours 08/14/22 2037     08/14/22 1000  azithromycin (ZITHROMAX) tablet 500 mg       See Hyperspace for full Linked Orders Report.   500 mg Oral Daily 08/14/22 0822 08/14/22 0944       REVIEW OF SYSTEMS:  NA as patient is somnolent He does say that his lower back hurt  Objective:  VITALS:  BP (!) 145/85 (BP Location: Right Arm)   Pulse 63   Temp (!) 97.5 F (36.4 C) (Oral)   Resp 13   Ht 5\' 11"  (1.803 m)   Wt 86.1 kg   SpO2 96%   BMI 26.47 kg/m   PHYSICAL EXAM:  General: somnolent, on calling his name he opens his eyes and responds to questions appropriately Pale Head: Normocephalic, without obvious abnormality, atraumatic. Eyes: Conjunctivae clear, anicteric sclerae. Pupils are equal ENT Nares normal. No drainage or sinus tenderness. Lips, mucosa, and tongue normal. No Thrush Neck:  symmetrical, no adenopathy, thyroid: non tender no carotid bruit and no JVD. Back: No CVA tenderness. Lungs: b/l air entry Heart: Regular rate and rhythm, no murmur, rub or gallop. Abdomen: Soft, non-tender,not distended. Bowel sounds normal. No masses Extremities:left femoral dressing Skin: No rashes or lesions. Or bruising Lymph: Cervical, supraclavicular normal. Neurologic: cannot be assessed Pertinent Labs Lab Results CBC    Component Value Date/Time   WBC 5.4 08/16/2022 0403   RBC 2.79 (L) 08/16/2022 0403   HGB 7.9 (L) 08/16/2022 0403   HGB 12.8 (L) 10/25/2014 1101   HCT 25.4 (L) 08/16/2022 0403   HCT 38.8 (L) 10/25/2014 1101   PLT 155 08/16/2022 0403   PLT 153 10/25/2014 1101   MCV 91.0 08/16/2022 0403   MCV 95 10/25/2014 1101   MCH 28.3 08/16/2022 0403   MCHC 31.1 08/16/2022 0403   RDW 15.6 (H) 08/16/2022 0403   RDW 13.5 10/25/2014 1101   LYMPHSABS 0.9 06/10/2022 0001   LYMPHSABS 0.9 (L) 10/25/2014 1101   MONOABS 0.5  06/10/2022 0001   MONOABS 0.5 10/25/2014 1101   EOSABS 0.1 06/10/2022 0001   EOSABS 0.2 10/25/2014 1101   BASOSABS 0.0 06/10/2022 0001   BASOSABS 0.0 10/25/2014 1101       Latest Ref Rng & Units 08/16/2022    4:03 AM 08/15/2022    4:40 AM 08/14/2022    2:47 AM  CMP  Glucose 70 - 99 mg/dL 106  104  104   BUN 8 - 23 mg/dL 30  32  31   Creatinine 0.61 - 1.24 mg/dL 2.10  2.33  2.69   Sodium 135 - 145 mmol/L 138  137  137   Potassium 3.5 - 5.1 mmol/L 4.5  4.3  4.6   Chloride 98 - 111 mmol/L 109  108  103   CO2 22 - 32 mmol/L 22  24  24    Calcium 8.9 - 10.3 mg/dL 8.6  8.7  9.1       Microbiology: Recent Results (from the past 240 hour(s))  Culture, blood (routine x 2)     Status: Abnormal (Preliminary result)   Collection Time: 08/14/22  3:57 AM   Specimen: BLOOD  Result Value Ref Range Status   Specimen Description   Final    BLOOD BLOOD RIGHT ARM Performed at [facility], [address], [place], [place] [address]    Special Requests   Final    BOTTLES DRAWN AEROBIC AND ANAEROBIC Blood Culture adequate volume Performed at [facility], [address] [place], [place] [address]    Culture  Setup Time   Final    Organism ID to follow [place] AND ANAEROBIC BOTTLES CRITICAL RESULT CALLED TO, READ BACK BY AND VERIFIED WITH: PHARMD CHILDS AT [place] 08/14/2022 GAA Performed at [facility], [address], [place],  [address]    Culture (A)  Final  ENTEROCOCCUS FAECALIS SUSCEPTIBILITIES TO FOLLOW Performed at [facility], [place] [address], [place], [place] [address]    Report Status PENDING  Incomplete  Culture, blood (routine x 2)     Status: None (Preliminary result)   Collection Time: 08/14/22  3:57 AM   Specimen: BLOOD  Result Value Ref Range Status   Specimen Description BLOOD BLOOD LEFT ARM  Final   Special Requests   Final    BOTTLES DRAWN AEROBIC AND ANAEROBIC Blood Culture adequate volume    Culture  Setup Time   Final    GRAM POSITIVE COCCI AEROBIC BOTTLE ONLY CRITICAL RESULT CALLED TO, READ BACK BY AND VERIFIED WITH: PHARMD CHILDS AT [place] 08/14/2022 GAA GRAM STAIN REVIEWED-AGREE WITH RESULT Performed at [facility], [address], [place], [place] [address]    Culture GRAM POSITIVE COCCI  Final   Report Status PENDING  Incomplete  Resp Panel by RT-PCR (Flu A&B, Covid) Anterior Nasal Swab     Status: None   Collection Time: 08/14/22  3:57 AM   Specimen: Anterior Nasal Swab  Result Value Ref Range Status   SARS Coronavirus 2 by RT PCR NEGATIVE NEGATIVE Final    Comment: (NOTE) SARS-CoV-2 target nucleic acids are NOT DETECTED.  The SARS-CoV-2 RNA is generally detectable in upper respiratory specimens during the acute phase of infection. The lowest concentration of SARS-CoV-2 viral copies this assay can detect is 138 copies/mL. A negative result does not preclude SARS-Cov-2 infection and should not be used as the sole basis for treatment or other patient management decisions. A negative result may occur with  improper specimen collection/handling, submission of specimen other than nasopharyngeal swab, presence of viral mutation(s) within the areas targeted by this assay, and inadequate number of viral copies(<138 copies/mL). A negative result must be combined with clinical observations, patient history, and epidemiological information. The expected result is Negative.  Fact Sheet for Patients:  EntrepreneurPulse.com.au  Fact Sheet for Healthcare Providers:  IncredibleEmployment.be  This test is no t yet approved or cleared by the [place] FDA and  has been authorized for detection and/or diagnosis of SARS-CoV-2 by FDA under an Emergency Use Authorization (EUA). This EUA will remain  in effect (meaning this test can be used) for the duration of the COVID-19 declaration under Section 564(b)(1) of the Act,  21 U.S.C.section 360bbb-3(b)(1), unless the authorization is terminated  or revoked sooner.       Influenza A by PCR NEGATIVE NEGATIVE Final   Influenza B by PCR NEGATIVE NEGATIVE Final    Comment: (NOTE) The Xpert Xpress SARS-CoV-2/FLU/RSV plus assay is intended as an aid in the diagnosis of influenza from Nasopharyngeal swab specimens and should not be used as a sole basis for treatment. Nasal washings and aspirates are unacceptable for Xpert Xpress SARS-CoV-2/FLU/RSV testing.  Fact Sheet for Patients: EntrepreneurPulse.com.au  Fact Sheet for Healthcare Providers: IncredibleEmployment.be  This test is not yet approved or cleared by the [place] FDA and has been authorized for detection and/or diagnosis of SARS-CoV-2 by FDA under an Emergency Use Authorization (EUA). This EUA will remain in effect (meaning this test can be used) for the duration of the COVID-19 declaration under Section 564(b)(1) of the Act, 21 U.S.C. section 360bbb-3(b)(1), unless the authorization is terminated or revoked.  Performed at [facility], [address] [place], [place] [address]   Blood Culture ID Panel (Reflexed)     Status: Abnormal   Collection Time: 08/14/22  3:57 AM  Result Value Ref  Range Status   Enterococcus faecalis DETECTED (A) NOT DETECTED Final    Comment: CRITICAL RESULT CALLED TO, READ BACK BY AND VERIFIED WITH: PHARMD CHILDS AT 1957 08/14/2022 GAA    Enterococcus Faecium NOT DETECTED NOT DETECTED Final   Listeria monocytogenes NOT DETECTED NOT DETECTED Final   Staphylococcus species NOT DETECTED NOT DETECTED Final   Staphylococcus aureus (BCID) NOT DETECTED NOT DETECTED Final   Staphylococcus epidermidis NOT DETECTED NOT DETECTED Final   Staphylococcus lugdunensis NOT DETECTED NOT DETECTED Final   Streptococcus species NOT DETECTED NOT DETECTED Final   Streptococcus agalactiae NOT DETECTED NOT DETECTED Final   Streptococcus  pneumoniae NOT DETECTED NOT DETECTED Final   Streptococcus pyogenes NOT DETECTED NOT DETECTED Final   A.calcoaceticus-baumannii NOT DETECTED NOT DETECTED Final   Bacteroides fragilis NOT DETECTED NOT DETECTED Final   Enterobacterales NOT DETECTED NOT DETECTED Final   Enterobacter cloacae complex NOT DETECTED NOT DETECTED Final   Escherichia coli NOT DETECTED NOT DETECTED Final   Klebsiella aerogenes NOT DETECTED NOT DETECTED Final   Klebsiella oxytoca NOT DETECTED NOT DETECTED Final   Klebsiella pneumoniae NOT DETECTED NOT DETECTED Final   Proteus species NOT DETECTED NOT DETECTED Final   Salmonella species NOT DETECTED NOT DETECTED Final   Serratia marcescens NOT DETECTED NOT DETECTED Final   Haemophilus influenzae NOT DETECTED NOT DETECTED Final   Neisseria meningitidis NOT DETECTED NOT DETECTED Final   Pseudomonas aeruginosa NOT DETECTED NOT DETECTED Final   Stenotrophomonas maltophilia NOT DETECTED NOT DETECTED Final   Candida albicans NOT DETECTED NOT DETECTED Final   Candida auris NOT DETECTED NOT DETECTED Final   Candida glabrata NOT DETECTED NOT DETECTED Final   Candida krusei NOT DETECTED NOT DETECTED Final   Candida parapsilosis NOT DETECTED NOT DETECTED Final   Candida tropicalis NOT DETECTED NOT DETECTED Final   Cryptococcus neoformans/gattii NOT DETECTED NOT DETECTED Final   Vancomycin resistance NOT DETECTED NOT DETECTED Final    Comment: Performed at [facility], [address] [place], [place] [address]  Urine Culture     Status: Abnormal   Collection Time: 08/14/22  6:54 AM   Specimen: Urine, Clean Catch  Result Value Ref Range Status   Specimen Description   Final    URINE, CLEAN CATCH Performed at [facility], [address], [place], [place] [address]    Special Requests   Final    NONE Performed at [facility], [address] [place], [place] [address]    Culture >=100,000 COLONIES/mL ENTEROCOCCUS FAECALIS (A)  Final    Report Status 08/16/2022 FINAL  Final   Organism ID, Bacteria ENTEROCOCCUS FAECALIS (A)  Final      Susceptibility   Enterococcus faecalis - MIC*    AMPICILLIN <=2 SENSITIVE Sensitive     NITROFURANTOIN <=16 SENSITIVE Sensitive     VANCOMYCIN 1 SENSITIVE Sensitive     * >=100,000 COLONIES/mL ENTEROCOCCUS FAECALIS  Culture, blood (Routine X 2) w Reflex to ID Panel     Status: None (Preliminary result)   Collection Time: 08/15/22 12:22 PM   Specimen: Right Antecubital; Blood  Result Value Ref Range Status   Specimen Description RIGHT ANTECUBITAL  Final   Special Requests   Final    BOTTLES DRAWN AEROBIC AND ANAEROBIC Blood Culture adequate volume   Culture   Final    NO GROWTH < 24 HOURS Performed at [facility], [address], [place], [place] [address]    Report Status PENDING  Incomplete  Culture, blood (  Routine X 2) w Reflex to ID Panel     Status: None (Preliminary result)   Collection Time: 08/15/22 12:22 PM   Specimen: BLOOD RIGHT ARM  Result Value Ref Range Status   Specimen Description BLOOD RIGHT ARM  Final   Special Requests   Final    BOTTLES DRAWN AEROBIC AND ANAEROBIC Blood Culture adequate volume   Culture   Final    NO GROWTH < 24 HOURS Performed at [facility], [address] [place], [place] [address]    Report Status PENDING  Incomplete    IMAGING RESULTS:  I have personally reviewed the films ?The abdominal aorta aneurysmal  sac was enlarging measuring 7.6 cm . Evidence of type 2 endoleak    Impression/Recommendation ? ?Low back pain- has previous L5-s1 fusion Could this be due to Degen disc disease Or due to the enlarging abdominal aneurysm or infection ( discitis/osteo) PT needs MRI but he has pacemaker , so he will need to go to [facility] may consider   Enterococcus bacteremia-  Concern for endocarditis due to pacemaker and also discitis/osteo Source could be the urinary trract as urine culture positive , but he has no  symptoms of UTI. HE has a h/o radical prostatectomy  Will need TEE Will add ceftriaxone to ampicillin Will repeat blood culture  AAA- s/p endovascular stent- worsening aneurysm with endo leak 2. S/p intervention today wth coiling of lumbar arteries  Small left PE  SA nodal disease- s/p pacemaker- lead in the left ventricle after going thru the interatrial septal opening Would need TEE to r/o endocarditis  Recent fall and fracture rt femur, s/p bipolar arthroplasty of the rt hip  CKD  Anemia  Multiple malignancies- SCC rt LL lung s/p lobectomy SCC tongue s/p radiation  CAD s/p CABG  s/p prostatectomy  Mild cognitive impairment- on aricept  Wife says patient is DNR and she wants to have a conversation with palliative team       ? ___________________________________________________ Discussed with his wife in detail Note:  This document was prepared using Dragon voice recognition software and may include unintentional dictation errors.

## 2022-08-16 NOTE — Progress Notes (Signed)
PROGRESS NOTE    Rick Mcbride.   QMG:[id number] DOB: 1941-07-08  DOA: 08/14/2022 Date of Service: 08/16/22 PCP: Idelle Crouch, MD     Brief Narrative / Hospital Course:  Rick T Singleton Hickox. is a 81 y.o. 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 Patient had surgery and finished SNF rehab. Currently he is doing PT/OT at home), chronic lower back pain (L5-S1 fusion), who presents with lower back pain and shortness breath. Pt had negative PE by V/Q scan which is ordered by his PCP day PTA, which was done for persistent SOB postoperatively. 10/07: WBC 3.8, negative COVID PCR, lactic acid 0.9, troponin level 31, 30, slightly worsening renal function with creatinine 2.69, BUN 31, GFR 23 (recent baseline creatinine 2.39 06/15/2022), temperature normal, blood pressure 172/84, 129/64, RR 83, heart rate 83, RR 17, oxygen saturation 98% on room air.  Chest x-ray showed possible mild patchy infiltration in left lower lobe.  CT head negative.  CT scan showed enlargement of AAA from previous 6.9 to 7.6 cm now.  Patient is admitted to telemetry bed as inpatient. Dr. Lorenso Courier of [facility] was consulted re: AAA, needs CTA, nephrology consulted and recommended IV fluids prior to CTA of Abd/Pelvis. Cardiology also consulted for surgical optimization/risk stratification - medically optimized at this time, troponin mild elevation likely d/t CKD4. BCx (+) E Faecalis, ID recommended r/o endocarditis/pacer infection w/ TEE and vascular surgery to assess re: graft involvement, changed Abx to ampicillin and repeat Bcx, also recommend MRI  L-spine or CT w/ contrast, pt has pacemaker   10/08: Pt and wife agreeable to TEE and MRI, though MRI would have to be at [facility]. Per vascular: "Noted bacteremia- however, patient with chronic progressive lower back pain; expanding aortic sac may be contributory to pain- not definitive. Therefore, preferable to intervene sooner than later." Per ID, would consider in order of importance to 1) treat bacteremia, 2) address aorto-bifem leak, 3) TEE, 4) MRI/CT L-spine. IF we decide to do CT, this can be done in-house, vs RI would require transfer. Will plan to proceed w/ IV abx, do vascular procedure tomorrow as scheduled, and see how he progresses from there.  10/09: Planning for vascular procedure today to fix graft.  Radiology reached out this morning, concern for small PE noted on CTA abdomen/pelvis.  Holding off on full CTA chest at this time due to concern with contrast/renal function but will plan for heparin.    Consultants:  Vascular surgery - TAA, AAA enlarged, R common iliac aneurysm Nephrology - CKD4 needing contrast study Cardiology - clearance for vascular procedure given CAD/CABG, Chronic HFpEF ID - (+)Bcx  Procedures: None at this time   Pertinent Results: 08/14/22: (+)BCx, G(+)cocci --> E Faecalis  08/14/22: CTA Abd/Pelvis - 1. 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 The bifurcated aortic stent graft is patent but the aneurysm sac is enlarging. Aneurysm sac measures up to 7.6 cm and measured 7.0 cm on 09/09/2021. 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 2. 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 3. Unusual location for a cardiac lead  that appears to be extending through in interatrial defect and terminating in the left ventricle. This is a chronic finding.     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 Lucky Cowboy. Now the size has enlarged from 6.9 to 7.6 cm consulted Dr. Lorenso Courier of [facility] - likely need surgical intervention. See CTA results  continue NS at 75cc/h  CAD S/P CABG x 3 Hx of CAD s/p of CABG and myocardial injury due to COPD exacerbation:  Myocardial injury less likely vs Troponin elevation d/t CKD4 Troponin level 31, 30.  Denies chest pain. Continue home Lipitor, Zetia hold ASA consulted Dr. Saralyn Pilar of cardiology per Dr. Dolores Hoose recommendation - optimized from cardiac standpoint, though risk is present. Troponins likely d/t CKD  Low back pain This seems to be a chronic issue, has worsened recently. 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 There is osteopenia degenerative change of the lumbar spine with discogenic mild grade 1 retrolisthesis unchanged at L1-2 and L2-3, slight dextroscoliosis. pain control: As needed Percocet, Tylenol As needed Robaxin Lidoderm MRI L Spine per ID recs given (+)Bcx -patient has pacemaker, family would like to avoid transferring to different facility if possible.  We will  plan for CT with contrast sometime this week pending renal function and surgical procedure 08/16/2022 for aortic graft  Chronic kidney disease, stage IV (severe) (HCC) Slightly worsening than baseline. Monitor renal function closely by BMP pt received 1L of NS in ED Nephrology following - see note for recs re: contrast administration for CTA   Positive blood cultures - E Facealis Bacteremia ID to follow Ampicillin Needs TEE - will confirm w/ cardiology  Needs MRI L spine - will hold off for now pending vascular procedure   Pulmonary Embolism Incomplete evaluation, recent VQ scan low risk but PE noted on CT angio abdomen/pelvis. Will receive heparin and vascular procedure and plan continue heparin infusion after that Consider CTA chest pending renal function, but large PE seems unlikely, vascular aware and not planning for thrombectomy  COPD exacerbation ([place]) Patient is a former smoker, smoked more than 20 years.  CT chest did not show focal infiltration, does not seem to have pneumonia.  Mild wheezing on auscultation, indicating possible undiagnosed COPD Procalcitonin elevated at 0.39.   Patient does not meet criteria for sepsis. will admit to tele bed as inpatient Bronchodilators pt was given 80 mg of Solu-Medrol in ED --> will change to oral prednisone 40 mg daily Z pak  Mucinex for cough  Incentive spirometry  Urine S. Pneumococcal and legionella antigen sputum culture Nasal cannula oxygen as needed to maintain O2 saturation 93% or greater if pt desat  Essential hypertension IV hydralazine as needed Metoprolol hold Cozaar since pt will receive contrast   Chronic diastolic CHF (congestive heart failure) ([place]) 2D echo on 06/14/2022 showed EF of 60 to 65% with grade 1 diastolic dysfunction.   CHF seem to be compensated. Check BNP  HLD (hyperlipidemia) Continue to Lipitor and zetia  Iron deficiency anemia Hemoglobin 8.6 (9.7 on 06/15/2022, and 8.6 on 06/13/2022), no active  bleeding. Follow-up with CBC Continue iron supplement  History of TIA (transient ischemic attack) Continue Lipitor Hold ASA and Plavix with pending vascular surgeon's evaluation of his AAA.  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  No acute issues.  Obesity (BMI 30-39.9) BMI= 30.68  and BW= 99.8 Diet and exercise.   Encourage to lose weight.  Anxiety start prn Xanax 0.25 mg bid    DVT prophylaxis: holding pharmacologic given bleed risk  Pertinent IV fluids/nutrition: IV fluids NS 75 mL/h Central lines / invasive devices: none  Code Status: DNR Family Communication: wife at bedside on rounds, I also spoke to daughter yesterday who is an Therapist, sports  Disposition: inpatient TOC needs: pending clinical progression  Barriers to discharge / significant pending items: see A/P, anticipate lengthy stay              Subjective:  Patient reports back pain is improved, responding well to p.o. medications and still needing some Dilaudid.  No chest pain, dizziness/headache, shortness of breath is stable he is not needing oxygen.       Objective:  Vitals:   08/16/22 0226 08/16/22 0522 08/16/22 0738 08/16/22 1208  BP:  (!) 156/66 (!) 145/85 (!) 156/66  Pulse:  (!) 59 63 (!) 57  Resp:  20 13 17   Temp:  97.8 F (36.6 C) (!) 97.5 F (36.4 C) 98.7 F (37.1 C)  TempSrc:   Oral Oral  SpO2:  100% 96% 100%  Weight: 86.1 kg     Height:        Intake/Output Summary (Last 24 hours) at 08/16/2022 1243 Last data filed at 08/16/2022 0700 Gross per 24 hour  Intake 1839.78 ml  Output 800 ml  Net 1039.78 ml   Filed Weights   08/14/22 0232 08/15/22 0500 08/16/22 0226  Weight: 99.8 kg 85.4 kg 86.1 kg    Examination:  Constitutional:  VS as above General Appearance: alert, well-developed, well-nourished, NAD Eyes: Normal lids and conjunctive, non-icteric sclera Ears,  Nose, Mouth, Throat: Normal external appearance MMM Neck: No masses, trachea midline Respiratory: Normal respiratory effort No wheeze No rhonchi No rales Cardiovascular: S1/S2 normal RRR No murmur No lower extremity edema Gastrointestinal: No tenderness Musculoskeletal:  Symmetrical movement in all extremities Neurological: No cranial nerve deficit on limited exam Alert Psychiatric: Normal judgment/insight Normal mood and affect       Scheduled Medications:   atorvastatin  40 mg Oral QHS   azithromycin  250 mg Oral Daily   calcitRIOL  0.25 mcg Oral q AM   docusate sodium  300 mg Oral QPC lunch   donepezil  10 mg Oral QHS   ezetimibe  10 mg Oral QPM   ferrous sulfate  325 mg Oral Q2000   ipratropium  2-4 spray Each Nare QHS   lidocaine  1 patch Transdermal Q24H   loratadine  10 mg  Oral Daily   melatonin  10 mg Oral QHS   methocarbamol  500 mg Oral Q2000   metoprolol succinate  25 mg Oral Daily   multivitamin with minerals  1 tablet Oral QPC lunch   pantoprazole  40 mg Oral QAC breakfast   predniSONE  40 mg Oral Q breakfast   traMADol  50 mg Oral QID   zolpidem  10 mg Oral QHS    Continuous Infusions:  sodium chloride 75 mL/hr at 08/16/22 0223   ampicillin (OMNIPEN) IV 2 g (08/16/22 0542)   cefTRIAXone (ROCEPHIN)  IV      PRN Medications:  acetaminophen, albuterol, ALPRAZolam, dextromethorphan-guaiFENesin, hydrALAZINE, HYDROmorphone (DILAUDID) injection, methocarbamol, ondansetron (ZOFRAN) IV, oxyCODONE-acetaminophen  Antimicrobials:  Anti-infectives (From admission, onward)    Start     Dose/Rate Route Frequency Ordered Stop   08/16/22 1400  cefTRIAXone (ROCEPHIN) 2 g in sodium chloride 0.9 % 100 mL IVPB        2 g 200 mL/hr over 30 Minutes Intravenous Every 12 hours 08/16/22 1231     08/15/22 1000  azithromycin (ZITHROMAX) tablet 250 mg       See Hyperspace for full Linked Orders Report.   250 mg Oral Daily 08/14/22 0822 08/19/22 0959   08/14/22  2200  ampicillin (OMNIPEN) 2 g in sodium chloride 0.9 % 100 mL IVPB        2 g 300 mL/hr over 20 Minutes Intravenous Every 8 hours 08/14/22 2037     08/14/22 1000  azithromycin (ZITHROMAX) tablet 500 mg       See Hyperspace for full Linked Orders Report.   500 mg Oral Daily 08/14/22 0822 08/14/22 0944       Data Reviewed: I have personally reviewed following labs and imaging studies  CBC: Recent Labs  Lab 08/14/22 0247 08/15/22 0440 08/16/22 0403  WBC 3.8* 4.4 5.4  HGB 8.6* 7.8* 7.9*  HCT 28.6* 25.5* 25.4*  MCV 92.9 90.7 91.0  PLT 136* 149* 527   Basic Metabolic Panel: Recent Labs  Lab 08/14/22 0247 08/15/22 0440 08/16/22 0403  NA 137 137 138  K 4.6 4.3 4.5  CL 103 108 109  CO2 24 24 22   GLUCOSE 104* 104* 106*  BUN 31* 32* 30*  CREATININE 2.69* 2.33* 2.10*  CALCIUM 9.1 8.7* 8.6*   GFR: Estimated Creatinine Clearance: 29.4 mL/min (A) (by C-G formula based on SCr of 2.1 mg/dL (H)). Liver Function Tests: No results for input(s): "AST", "ALT", "ALKPHOS", "BILITOT", "PROT", "ALBUMIN" in the last 168 hours. No results for input(s): "LIPASE", "AMYLASE" in the last 168 hours. No results for input(s): "AMMONIA" in the last 168 hours. Coagulation Profile: Recent Labs  Lab 08/14/22 1958  INR 1.1   Cardiac Enzymes: No results for input(s): "CKTOTAL", "CKMB", "CKMBINDEX", "TROPONINI" in the last 168 hours. BNP (last 3 results) No results for input(s): "PROBNP" in the last 8760 hours. HbA1C: No results for input(s): "HGBA1C" in the last 72 hours. CBG: No results for input(s): "GLUCAP" in the last 168 hours. Lipid Profile: No results for input(s): "CHOL", "HDL", "LDLCALC", "TRIG", "CHOLHDL", "LDLDIRECT" in the last 72 hours. Thyroid Function Tests: No results for input(s): "TSH", "T4TOTAL", "FREET4", "T3FREE", "THYROIDAB" in the last 72 hours. Anemia Panel: No results for input(s): "VITAMINB12", "FOLATE", "FERRITIN", "TIBC", "IRON", "RETICCTPCT" in the last 72  hours. Urine analysis:    Component Value Date/Time   COLORURINE YELLOW (A) 08/14/2022 0654   APPEARANCEUR CLEAR (A) 08/14/2022 0654   APPEARANCEUR Cloudy (A) 03/09/2021 1332  LABSPEC 1.017 08/14/2022 0654   LABSPEC 1.015 10/25/2014 1330   PHURINE 6.0 08/14/2022 0654   GLUCOSEU NEGATIVE 08/14/2022 0654   GLUCOSEU Negative 10/25/2014 1330   HGBUR NEGATIVE 08/14/2022 0654   BILIRUBINUR NEGATIVE 08/14/2022 0654   BILIRUBINUR Negative 03/09/2021 1332   BILIRUBINUR Negative 10/25/2014 [place] 08/14/2022 0654   PROTEINUR 30 (A) 08/14/2022 0654   NITRITE NEGATIVE 08/14/2022 0654   LEUKOCYTESUR NEGATIVE 08/14/2022 0654   LEUKOCYTESUR Negative 10/25/2014 1330   Sepsis Labs: @LABRCNTIP (procalcitonin:4,lacticidven:4)  Recent Results (from the past 240 hour(s))  Culture, blood (routine x 2)     Status: Abnormal (Preliminary result)   Collection Time: 08/14/22  3:57 AM   Specimen: BLOOD  Result Value Ref Range Status   Specimen Description   Final    BLOOD BLOOD RIGHT ARM Performed at [facility], [address] [place], [place] [address]    Special Requests   Final    BOTTLES DRAWN AEROBIC AND ANAEROBIC Blood Culture adequate volume Performed at [facility], [address] [place], [place] [address]    Culture  Setup Time   Final    Organism ID to follow [place] TO, READ BACK BY AND VERIFIED WITH: PHARMD CHILDS AT 1957 08/14/2022 GAA Performed at [facility], [address] [place], [place] [address]    Culture (A)  Final    ENTEROCOCCUS FAECALIS SUSCEPTIBILITIES TO FOLLOW Performed at [facility], [place] [address] [place], [place] [address]    Report Status PENDING  Incomplete  Culture, blood (routine x 2)     Status: None (Preliminary result)   Collection Time: 08/14/22  3:57 AM   Specimen: BLOOD  Result Value Ref Range Status   Specimen  Description BLOOD BLOOD LEFT ARM  Final   Special Requests   Final    BOTTLES DRAWN AEROBIC AND ANAEROBIC Blood Culture adequate volume   Culture  Setup Time   Final    GRAM POSITIVE COCCI AEROBIC BOTTLE ONLY CRITICAL RESULT CALLED TO, READ BACK BY AND VERIFIED WITH: PHARMD CHILDS AT [place] 08/14/2022 GAA GRAM STAIN REVIEWED-AGREE WITH RESULT Performed at [facility], [address] [place], [place] [address]    Culture GRAM POSITIVE COCCI  Final   Report Status PENDING  Incomplete  Resp Panel by RT-PCR (Flu A&B, Covid) Anterior Nasal Swab     Status: None   Collection Time: 08/14/22  3:57 AM   Specimen: Anterior Nasal Swab  Result Value Ref Range Status   SARS Coronavirus 2 by RT PCR NEGATIVE NEGATIVE Final    Comment: (NOTE) SARS-CoV-2 target nucleic acids are NOT DETECTED.  The SARS-CoV-2 RNA is generally detectable in upper respiratory specimens during the acute phase of infection. The lowest concentration of SARS-CoV-2 viral copies this assay can detect is 138 copies/mL. A negative result does not preclude SARS-Cov-2 infection and should not be used as the sole basis for treatment or other patient management decisions. A negative result may occur with  improper specimen collection/handling, submission of specimen other than nasopharyngeal swab, presence of viral mutation(s) within the areas targeted by this assay, and inadequate number of viral copies(<138 copies/mL). A negative result must be combined with clinical observations, patient history, and epidemiological information. The expected result is Negative.  Fact Sheet for Patients:  EntrepreneurPulse.com.au  Fact Sheet for Healthcare Providers:  IncredibleEmployment.be  This test is no t yet approved or cleared by  the [employer] and  has been authorized for detection and/or diagnosis of SARS-CoV-2 by FDA under an Emergency Use Authorization (EUA). This EUA will  remain  in effect (meaning this test can be used) for the duration of the COVID-19 declaration under Section 564(b)(1) of the Act, 21 U.S.C.section 360bbb-3(b)(1), unless the authorization is terminated  or revoked sooner.       Influenza A by PCR NEGATIVE NEGATIVE Final   Influenza B by PCR NEGATIVE NEGATIVE Final    Comment: (NOTE) The Xpert Xpress SARS-CoV-2/FLU/RSV plus assay is intended as an aid in the diagnosis of influenza from Nasopharyngeal swab specimens and should not be used as a sole basis for treatment. Nasal washings and aspirates are unacceptable for Xpert Xpress SARS-CoV-2/FLU/RSV testing.  Fact Sheet for Patients: EntrepreneurPulse.com.au  Fact Sheet for Healthcare Providers: IncredibleEmployment.be  This test is not yet approved or cleared by the [place] FDA and has been authorized for detection and/or diagnosis of SARS-CoV-2 by FDA under an Emergency Use Authorization (EUA). This EUA will remain in effect (meaning this test can be used) for the duration of the COVID-19 declaration under Section 564(b)(1) of the Act, 21 U.S.C. section 360bbb-3(b)(1), unless the authorization is terminated or revoked.  Performed at [facility], [address] [place], [place] [address]   Blood Culture ID Panel (Reflexed)     Status: Abnormal   Collection Time: 08/14/22  3:57 AM  Result Value Ref Range Status   Enterococcus faecalis DETECTED (A) NOT DETECTED Final    Comment: CRITICAL RESULT CALLED TO, READ BACK BY AND VERIFIED WITH: PHARMD CHILDS AT 1957 08/14/2022 GAA    Enterococcus Faecium NOT DETECTED NOT DETECTED Final   Listeria monocytogenes NOT DETECTED NOT DETECTED Final   Staphylococcus species NOT DETECTED NOT DETECTED Final   Staphylococcus aureus (BCID) NOT DETECTED NOT DETECTED Final   Staphylococcus epidermidis NOT DETECTED NOT DETECTED Final   Staphylococcus lugdunensis NOT DETECTED NOT DETECTED Final    Streptococcus species NOT DETECTED NOT DETECTED Final   Streptococcus agalactiae NOT DETECTED NOT DETECTED Final   Streptococcus pneumoniae NOT DETECTED NOT DETECTED Final   Streptococcus pyogenes NOT DETECTED NOT DETECTED Final   A.calcoaceticus-baumannii NOT DETECTED NOT DETECTED Final   Bacteroides fragilis NOT DETECTED NOT DETECTED Final   Enterobacterales NOT DETECTED NOT DETECTED Final   Enterobacter cloacae complex NOT DETECTED NOT DETECTED Final   Escherichia coli NOT DETECTED NOT DETECTED Final   Klebsiella aerogenes NOT DETECTED NOT DETECTED Final   Klebsiella oxytoca NOT DETECTED NOT DETECTED Final   Klebsiella pneumoniae NOT DETECTED NOT DETECTED Final   Proteus species NOT DETECTED NOT DETECTED Final   Salmonella species NOT DETECTED NOT DETECTED Final   Serratia marcescens NOT DETECTED NOT DETECTED Final   Haemophilus influenzae NOT DETECTED NOT DETECTED Final   Neisseria meningitidis NOT DETECTED NOT DETECTED Final   Pseudomonas aeruginosa NOT DETECTED NOT DETECTED Final   Stenotrophomonas maltophilia NOT DETECTED NOT DETECTED Final   Candida albicans NOT DETECTED NOT DETECTED Final   Candida auris NOT DETECTED NOT DETECTED Final   Candida glabrata NOT DETECTED NOT DETECTED Final   Candida krusei NOT DETECTED NOT DETECTED Final   Candida parapsilosis NOT DETECTED NOT DETECTED Final   Candida tropicalis NOT DETECTED NOT DETECTED Final   Cryptococcus neoformans/gattii NOT DETECTED NOT DETECTED Final   Vancomycin resistance NOT DETECTED NOT DETECTED Final    Comment: Performed at [facility], [address], [place], [place] [address]  Urine Culture  Status: Abnormal   Collection Time: 08/14/22  6:54 AM   Specimen: Urine, Clean Catch  Result Value Ref Range Status   Specimen Description   Final    URINE, CLEAN CATCH Performed at [facility], [address] [place], [place] [address]    Special Requests   Final    NONE Performed at  [facility], [address] [place], [place] [address]    Culture >=100,000 COLONIES/mL ENTEROCOCCUS FAECALIS (A)  Final   Report Status 08/16/2022 FINAL  Final   Organism ID, Bacteria ENTEROCOCCUS FAECALIS (A)  Final      Susceptibility   Enterococcus faecalis - MIC*    AMPICILLIN <=2 SENSITIVE Sensitive     NITROFURANTOIN <=16 SENSITIVE Sensitive     VANCOMYCIN 1 SENSITIVE Sensitive     * >=100,000 COLONIES/mL ENTEROCOCCUS FAECALIS  Culture, blood (Routine X 2) w Reflex to ID Panel     Status: None (Preliminary result)   Collection Time: 08/15/22 12:22 PM   Specimen: Right Antecubital; Blood  Result Value Ref Range Status   Specimen Description RIGHT ANTECUBITAL  Final   Special Requests   Final    BOTTLES DRAWN AEROBIC AND ANAEROBIC Blood Culture adequate volume   Culture   Final    NO GROWTH < 24 HOURS Performed at [facility], [address], [place], [place] [address]    Report Status PENDING  Incomplete  Culture, blood (Routine X 2) w Reflex to ID Panel     Status: None (Preliminary result)   Collection Time: 08/15/22 12:22 PM   Specimen: BLOOD RIGHT ARM  Result Value Ref Range Status   Specimen Description BLOOD RIGHT ARM  Final   Special Requests   Final    BOTTLES DRAWN AEROBIC AND ANAEROBIC Blood Culture adequate volume   Culture   Final    NO GROWTH < 24 HOURS Performed at [facility], [address] [place], [place] [address]    Report Status PENDING  Incomplete         Radiology Studies: CT Angio Abd/Pel w/ and/or w/o  Result Date: 08/14/2022 CLINICAL DATA:  History of endovascular repair of an abdominal aortic aneurysm. Evaluate abdominal aortic aneurysm sac. EXAM: CTA ABDOMEN AND PELVIS WITHOUT AND WITH CONTRAST TECHNIQUE: Multidetector CT imaging of the abdomen and pelvis was performed using the standard protocol during bolus administration of intravenous contrast. Multiplanar reconstructed images and MIPs were obtained  and reviewed to evaluate the vascular anatomy. RADIATION DOSE REDUCTION: This exam was performed according to the departmental dose-optimization program which includes automated exposure control, adjustment of the mA and/or kV according to patient size and/or use of iterative reconstruction technique. CONTRAST:  93mL OMNIPAQUE IOHEXOL 350 MG/ML SOLN COMPARISON:  CT chest abdomen pelvis 08/14/2022 and PET-CT 09/09/2021 FINDINGS: VASCULAR Aorta: Endovascular repair of an abdominal aortic aneurysm with a bifurcated infrarenal aortic stent graft. Aortic stent graft is patent. The aneurysm sac measures up to 7.6 cm and measured 7.0 cm on 09/09/2021. There is contrast within the posterior proximal aspect of the aneurysm sac likely coming from lumbar arteries. Large amount of contrast within the sac on the delayed images. 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 No evidence for an aortic sac rupture. Celiac: Patent without evidence of aneurysm, dissection, vasculitis or significant stenosis. Incidentally, there is an accessory left hepatic artery coming off the left gastric artery. SMA: Approximately 50% stenosis involving the origin and proximal aspect of the SMA related to mixed plaque.  SMA is patent. Renals: Both renal arteries are patent without evidence of aneurysm, dissection, vasculitis, fibromuscular dysplasia or significant stenosis. IMA: Retrograde filling of the inferior mesenteric artery and there is contrast near the origin. 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 Inflow: Left limb terminates in the distal left common iliac artery. Left internal and external iliac arteries are patent. There is focal dilatation in the left internal iliac artery measuring up to 1.2 cm. Right limb extends into the right external iliac artery and there has been endovascular embolization of the right internal iliac artery origin. 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 Right external iliac artery is patent. Proximal Outflow: Proximal femoral arteries are patent bilaterally Veins: IVC and renal veins are patent. No gross abnormality to the iliac veins. Review of the MIP images confirms the above findings. NON-VASCULAR Lower chest: Emphysema. 8 mm nodule in the left upper lobe on sequence 7, image 28 is stable since 09/18/2019. No large pleural effusion. Again noted is a cardiac pacemaker with a lead extending through an interatrial defect and appears to be terminating in the left ventricle. The other cardiac lead is in the right atrial appendage. Pacemaker leads appear to be chronic. Post CABG changes. Hepatobiliary: Normal appearance of the liver and gallbladder. Pancreas: Unremarkable. No pancreatic ductal dilatation or surrounding inflammatory changes. Spleen: 9 mm hyper hypodensity in the spleen appears chronic no acute abnormality. Adrenals/Urinary Tract: Normal appearance of the adrenal glands. Both kidneys are atrophic without hydronephrosis. Low-density cyst in left kidney again noted and do not require dedicated follow-up. 9 mm stone in the left kidney upper pole. Normal appearance of the urinary bladder. Small calcification in the right kidney upper pole region could be vascular in etiology. Stomach/Bowel: Rectum is distended with gas and stool. No evidence for bowel obstruction or focal bowel inflammation. Normal appearance of the stomach. Lymphatic: No significant lymph node enlargement in the abdomen or pelvis. Reproductive: Evidence for prostatectomy. Other: Negative for free fluid.  Negative for free air. Musculoskeletal: There is a intramedullary nail in the right humerus which is incompletely imaged. Right hip arthroplasty is located. Bilateral pedicle screw and rod fixation at L5-S1. Stable anterolisthesis of L5 on S1 with disc space loss at L5-S1. Disc space narrowing at L4-L5. Chronic mild retrolisthesis of L2 on L3.  IMPRESSION: VASCULAR 1. 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 The bifurcated aortic stent graft is patent but the aneurysm sac is enlarging. Aneurysm sac measures up to 7.6 cm and measured 7.0 cm on 09/09/2021. 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 2. 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 3. Unusual location for a cardiac lead that appears to be extending through in interatrial defect and terminating in the left ventricle. This is a chronic finding. NON-VASCULAR 1. No acute abnormality in the abdomen or pelvis. 2. Atrophy in both kidneys. 3. Aortic Atherosclerosis (ICD10-I70.0) and Emphysema (ICD10-J43.9). 4. Nonobstructive left nephrolithiasis. Electronically Signed   By: Markus Daft M.D.   On: 08/14/2022 15:29   CT CHEST ABDOMEN PELVIS WO CONTRAST  Result Date: 08/14/2022 CLINICAL DATA:  Pneumonia, complications suspected. Low back pain onset 2 days ago worsening with movement. Shortness of breath. EXAM: CT CHEST, ABDOMEN AND PELVIS WITHOUT CONTRAST TECHNIQUE: Multidetector CT imaging of the chest, abdomen and pelvis was performed following the standard protocol without IV contrast. RADIATION  DOSE REDUCTION: This exam was performed according to the departmental dose-optimization program which includes automated exposure control, adjustment of the mA and/or kV according to patient size and/or use of iterative reconstruction technique. COMPARISON:  PET-CT skull base to pelvis 09/09/2021, CT abdomen pelvis without contrast 03/31/2021. Most recent chest x-ray was portable chest today, portable chest 08/13/2022. FINDINGS: CT CHEST FINDINGS Cardiovascular: There is mild cardiomegaly. Metal artifact from left chest dual lead pacing system and dual lead wires in the heart. There are CABG changes. Aortic tortuosity and moderate patchy arthrosclerosis with aortic root ectasia up to 3.8 cm and ascending aorta with  mild dilatation to 4.2 cm. The remainder is within normal caliber limits with scattered calcific plaque in the great vessels. There is a prominent pulmonary trunk 3.4 cm indicating arterial hypertension, unchanged. There are normal caliber pulmonary veins. Mediastinum/Nodes: No intrathoracic or axillary adenopathy. Thyroid gland obscured by metallic artifact from bilateral shoulder replacements and left chest pacemaker. Small amount of retained secretions at the right posterolateral tracheal wall, otherwise unremarkable trachea and main bronchi. Lungs/Pleura: Chronic changes in the bases and moderate emphysematous disease with both paraseptal and centrilobular changes. Right lower lobectomy with volume loss and mild asymmetric elevation of the right diaphragm. There is mild bronchial thickening. No pneumonic infiltrate or nodule is seen. No pleural effusion, thickening or pneumothorax. Musculoskeletal: There is osteopenia, degenerative disc disease and spondylosis of the thoracic spine. As above there are bilateral shoulder replacements. CT ABDOMEN PELVIS FINDINGS Hepatobiliary: The liver is unremarkable without contrast. The gallbladder and bile ducts are unremarkable. Pancreas: No focal abnormality. Spleen: Chronic subcentimeter low-attenuation lesion in the central spleen. Probable cyst or hemangioma. Stable. Otherwise unremarkable without contrast. Adrenals/Urinary Tract: There is no adrenal mass. There is bilateral renal cortical thinning. Stable left renal cysts and 1 cm nonobstructive caliceal stone in the superior pole. 2 mm stone or renovascular calcification upper pole right kidney. Perinephric stranding is also similar. There is no ureteral stone or hydronephrosis. The right side of the bladder obscured by interval new right hip replacement. The visualized bladder normal in thickness. Stomach/Bowel: Small hiatal hernia. Contracted stomach with normal caliber unopacified small bowel. Normal appendix which  is well visible. Mild-to-moderate stool retention in the colon including the rectum but no rectal wall thickening or inflammation. Scattered sigmoid diverticula without diverticulitis. Vascular/Lymphatic: Aortoiliac heavy calcific plaques are again noted with aorto bi-iliac stent graft. A large excluded aneurysm sac is again noted and is larger than previously. On the 2 prior studies, the aneurysmal sac measured 6.9 x 7.0 cm, today measuring 7.6 x 7.6 cm. 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 There are endovascular coils in the region of the right internal iliac artery which were noted previously. Reproductive: Old prostatectomy. Other: There are tiny umbilical and inguinal fat hernias. There is no incarcerated hernia. There is no free hemorrhage, free fluid or free air, or inflammatory stranding around the AAA. Multiple pelvic phleboliths. Musculoskeletal: Chronic L5-S1 fusion hardware is again noted with chronic L5 spondylolysis and grade 2 L5-S1 spondylolisthesis, unchanged. There is osteopenia degenerative change of the lumbar spine with discogenic mild grade 1 retrolisthesis unchanged at L1-2 and L2-3, slight dextroscoliosis. Interval right hip replacement. No acute hardware complications. No operative site hematoma. Ankylosis left SI joint. IMPRESSION: 1. COPD and mild chronic bronchitis with prior right lower lobectomy. No focal pneumonia is seen. Chronic prominence of the pulmonary trunk. 2. Aortic and coronary artery atherosclerosis, prior CABG, pacemaker,  and abdominal aortobi-iliac stent grafting. 3. 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 Annual CTA or MRA follow-up recommended improved 4. 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 CTA would be the preferred imaging workup, MRA if the patient cannot have IV contrast. 5. Constipation and diverticulosis. 6. Nonobstructive  nephrolithiasis. 7. Degenerative and postsurgical changes of the spine and osteopenia. No acute spinal compression fracture. Electronically Signed   By: Telford Nab M.D.   On: 08/14/2022 05:23   CT Head Wo Contrast  Result Date: 08/14/2022 CLINICAL DATA:  81 year old male with altered mental status. Increasing back pain with movement. EXAM: CT HEAD WITHOUT CONTRAST TECHNIQUE: Contiguous axial images were obtained from the base of the skull through the vertex without intravenous contrast. RADIATION DOSE REDUCTION: This exam was performed according to the departmental dose-optimization program which includes automated exposure control, adjustment of the mA and/or kV according to patient size and/or use of iterative reconstruction technique. COMPARISON:  Brain MRI 01/14/2010.  Head CT 06/10/2022. 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 Small chronic bilateral cerebellar infarcts. Stable cerebral volume. Patchy encephalomalacia and dystrophic calcification in the right parietal lobe is stable. Small area of chronic encephalomalacia in the right inferior frontal gyrus is stable. No midline shift, ventriculomegaly, mass effect, evidence of mass lesion, intracranial hemorrhage or evidence of cortically based acute infarction. Vascular: Calcified atherosclerosis at the skull base. No suspicious intracranial vascular hyperdensity. Skull: No acute osseous abnormality identified. Sinuses/Orbits: Mild sinus opacification in the left frontal, frontoethmoidal recess and sphenoid sinus have not significantly changed from last month. And overall paranasal sinuses and mastoids are well aerated. Other: No acute orbit or scalp soft tissue finding. IMPRESSION: 1. No acute intracranial abnormality identified. 2. Advanced chronic ischemic disease appears stable by CT since last month. Electronically Signed   By: Genevie Ann M.D.   On: 08/14/2022 04:54   DG  Chest Portable 1 View  Result Date: 08/14/2022 CLINICAL DATA:  Shortness of breath, low back pain EXAM: PORTABLE CHEST 1 VIEW COMPARISON:  08/13/2022 FINDINGS: Mild patchy right lower lobe opacity, new, suspicious for pneumonia. Trace right pleural effusion versus chronic pleural thickening. Left lung is clear. No pneumothorax. Mild cardiomegaly. Postsurgical changes related to prior CABG. Left subclavian pacemaker. Median sternotomy. IMPRESSION: Mild patchy right lower lobe opacity, new, suspicious for pneumonia. Electronically Signed   By: Julian Hy M.D.   On: 08/14/2022 03:17            LOS: 2 days       Emeterio Reeve, DO [facility] 08/16/2022, 12:43 PM   Staff may message me via secure chat in [place]  but this may not receive immediate response,  please page for urgent matters!  If 7PM-7AM, please contact night-coverage [email or web address]  Dictation software was used to generate the above note. Typos may occur and escape review, as with typed/written notes. Please contact Dr Sheppard Coil directly for clarity if needed.

## 2022-08-16 NOTE — Op Note (Signed)
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  Ultrasound guidance for vascular access left femoral artery             2.  Catheter placement into multiple tertiary hypogastric artery branches ultimately feeding into the aneurysm sac with 2 lumbar arteries from left femoral approach             3.  Subselective and selective left hypogastric artery injections all the way out to lumbar branches of the aneurysm sac              4.  Coil embolization with a series of EV 3 neuro coils using a total of 10 coils to coil both lumbar artery branches and the tertiary left hypogastric arterial branch feeding the vessels creating a type II endoleak             5.  StarClose closure device left femoral artery  EBL: 5 cc  Contrast: 85 cc  Fluoro Time: 34 minutes  Moderate Conscious Sedation Time: approximately 119 minutes using 1.5 mg of Versed and 50 mcg of Fentanyl              Indications:  Patient is a 81 y.o.male with an enlarging aneurysm sac with a type II endoleak seen on CT scan.  He has severe back pain and is not clear if this is contributed to his back pain and treatment of the type II endoleak is indicated for this reason.  Risks and benefits are discussed and informed consent is obtained.   Procedure:  The patient was identified and appropriate procedural time out was performed.  The patient was then placed supine on the table and prepped and draped in the usual sterile fashion. Moderate conscious sedation was administered during a face to face encounter with the patient throughout the procedure with my supervision of the RN administering medicines and monitoring the patient's vital signs, pulse oximetry, telemetry and  mental status throughout from the start of the procedure until the patient was taken to the recovery room. Ultrasound was used to evaluate the left common femoral artery.  It was patent .  A digital ultrasound image was acquired.  A Seldinger needle was used to access the left common femoral artery under direct ultrasound guidance and a permanent image was performed.  A 0.035 J wire was advanced without resistance and a 5Fr sheath was placed.  I then performed imaging to evaluate the iliac bifurcation and then used a rim catheter to selectively cannulate the left hypogastric artery.  Selective imaging of the left hypogastric artery was then performed.  There was only 1 significant hypogastric artery branches seem to be feeding the aneurysm sac.  This is a tertiary left hypogastric artery branch coming off of the lateral pelvic branch that went superiorly.  It ultimately felt a pair of lumbar branches that were clearly feeding the aneurysm sac.  This was very tortuous and had a very steep angle off of the lateral pelvic branch.  First, using a prograde microcatheter I was able to cannulate this branch but could not go beyond about 8 to 10 cm downstream in this branch with the prograde microcatheter.  Ultimately, several wires and catheters were tried but we  ended up using an Echelon Neuro microcatheter and a floppy 0.014 wire and was able to get out beyond multiple tortuous turns in this branch and ultimately out to the lumbar artery as it entered the aneurysm sac.  We then began coil embolization using a series of EV 3 neuro coils.  The first 3 coils were 3 mm diameter by 10 cm in length and then we used a 4 mm diameter by 10 cm length coil to get this back to where the superior branch came off.  We were able to get to the origin of the superior branch but not all the way out to the actual lumbar artery at the aneurysm sac, but we proceeded with embolization of this branch as well using a pair of 4 mm diameter by 12  cm length EV 3 neuro coils.  I then used a total of four 5 mm diameter by 15 cm length EV 3 neuro coils to get the feeding vessel all the way back to near its origin off the lateral pelvic branch of the hypogastric artery.  The microcatheter was then removed and the rim catheter injection showed no flow into the aneurysm sac through a selective left hypogastric artery injection.  I elected to terminate the procedure. The sheath was removed and StarClose closure device was deployed in the left femoral artery with excellent hemostatic result. The patient was taken to the recovery room in stable condition having tolerated the procedure well.  Findings: Tertiary left hypogastric artery branch coming off of a lateral pelvic branch going superior and ultimately feeding the aneurysm sac with a pair of lumbar branches.  This was incredibly tortuous.                Disposition: Patient was taken to the recovery room in stable condition having tolerated the procedure well.  Complications: None  [name] 08/16/2022 3:51 PM   This note was created with Dragon Medical transcription system. Any errors in dictation are purely unintentional.

## 2022-08-16 NOTE — Progress Notes (Signed)
[facility] Cardiology Patient ID: Rick Mcbride. MRN: [id number] DOB/AGE: 01-10-1941 81 y.o.   Admit date: 08/14/2022 Referring Physician Blaine Hamper Primary Physician [facility] Primary Cardiologist Nehemiah Massed Reason for Consultation preoperative cardiovascular evaluation   HPI: 81 year old gentleman referred for preoperative cardiovascular evaluation prior to abdominal aortic aneurysm repair.  Patient brought to [facility] ED via EMS with chief complaint of lower back pain and generalized weakness.  ECG revealed atrial sensing with ventricular pacing.  Admission labs notable for borderline elevated high-sensitivity troponin (31, 30) in the absence of chest pain, likely elevated due to underlying chronic kidney disease.  Abdominal CT was performed which revealed abdominal aortic aneurysm 7.6 x 7.6 cm which is increased compared to most recent study showing 6.9 x 7.0 cm.  The patient evaluated by vascular surgery who is recommending surgical repair.   The patient has known coronary disease, status post CABG with LIMA to LAD, SVG to OM1 and PDA performed 2005 at [facility].  The patient has dual-chamber pacemaker for second-degree AV block.  Patient has known chronic kidney disease.  He has prior history of CVA/TIA.  Most recent 2D echocardiogram 06/14/2022 revealed LVEF 60-65 %.  Lexiscan Myoview 10/24/2019 did not reveal evidence for scar or ischemia.  The patient currently denies chest pain.  He does report intermittent shortness of breath which is felt to be due to possible bronchitis.  Chest x-ray reveals possible right lower lobe pneumonia.  Interval history: -Continues to have intermittent spasms of severe back pain -Denies chest pain or shortness of breath -Vascular planning intervention today   Vitals:   08/15/22 2313 08/16/22 0226 08/16/22 0522 08/16/22 0738  BP: (!) 141/73  (!) 156/66 (!) 145/85  Pulse: 67  (!) 59 63  Resp: 17  20 13   Temp: 98.1 F (36.7 C)  97.8 F (36.6 C) (!) 97.5 F (36.4 C)  TempSrc:  Oral   Oral  SpO2: 100%  100% 96%  Weight:  86.1 kg    Height:         Intake/Output Summary (Last 24 hours) at 08/16/2022 1123 Last data filed at 08/16/2022 0700 Gross per 24 hour  Intake 1839.78 ml  Output 1250 ml  Net 589.78 ml       PHYSICAL EXAM  General: Pleasant elderly Caucasian male, well nourished, in no acute distress.  Laying flat in hospital bed with wife at bedside HEENT:  Normocephalic and atraumatic Neck:  No JVD.  Lungs: Normal respiratory effort on room air.  Clear bilaterally to auscultation Heart: Paced rhythm. Normal S1 and S2 without gallops or murmurs.  Abdomen: Nondistended appearing  Msk:   Normal strength and tone for age. Extremities: No clubbing, cyanosis or edema.   Neuro: Alert and oriented X 3. Psych:  Good affect, responds appropriately   LABS: Basic Metabolic Panel: Recent Labs    08/15/22 0440 08/16/22 0403  NA 137 138  K 4.3 4.5  CL 108 109  CO2 24 22  GLUCOSE 104* 106*  BUN 32* 30*  CREATININE 2.33* 2.10*  CALCIUM 8.7* 8.6*    Liver Function Tests: No results for input(s): "AST", "ALT", "ALKPHOS", "BILITOT", "PROT", "ALBUMIN" in the last 72 hours. No results for input(s): "LIPASE", "AMYLASE" in the last 72 hours. CBC: Recent Labs    08/15/22 0440 08/16/22 0403  WBC 4.4 5.4  HGB 7.8* 7.9*  HCT 25.5* 25.4*  MCV 90.7 91.0  PLT 149* 155    Cardiac Enzymes: No results for input(s): "CKTOTAL", "CKMB", "CKMBINDEX", "TROPONINI" in the last 72  hours. BNP: Invalid input(s): "POCBNP" D-Dimer: No results for input(s): "DDIMER" in the last 72 hours. Hemoglobin A1C: No results for input(s): "HGBA1C" in the last 72 hours. Fasting Lipid Panel: No results for input(s): "CHOL", "HDL", "LDLCALC", "TRIG", "CHOLHDL", "LDLDIRECT" in the last 72 hours. Thyroid Function Tests: No results for input(s): "TSH", "T4TOTAL", "T3FREE", "THYROIDAB" in the last 72 hours.  Invalid input(s): "FREET3" Anemia Panel: No results for input(s):  "VITAMINB12", "FOLATE", "FERRITIN", "TIBC", "IRON", "RETICCTPCT" in the last 72 hours.  CT Angio Abd/Pel w/ and/or w/o  Addendum Date: 08/16/2022   ADDENDUM REPORT: 08/16/2022 08:56 ADDENDUM: Upon re-evaluation of the study, there is a filling defect involving a left upper lobe pulmonary artery subsegmental branch on sequence 6, image 108. This is concerning for a small pulmonary embolism. These results were called by telephone at the time of interpretation on 08/16/2022 at 8:54 am to provider Emeterio Reeve, DO , who verbally acknowledged these results. Electronically Signed   By: Markus Daft M.D.   On: 08/16/2022 08:56   Result Date: 08/16/2022 CLINICAL DATA:  History of endovascular repair of an abdominal aortic aneurysm. Evaluate abdominal aortic aneurysm sac. EXAM: CTA ABDOMEN AND PELVIS WITHOUT AND WITH CONTRAST TECHNIQUE: Multidetector CT imaging of the abdomen and pelvis was performed using the standard protocol during bolus administration of intravenous contrast. Multiplanar reconstructed images and MIPs were obtained and reviewed to evaluate the vascular anatomy. RADIATION DOSE REDUCTION: This exam was performed according to the departmental dose-optimization program which includes automated exposure control, adjustment of the mA and/or kV according to patient size and/or use of iterative reconstruction technique. CONTRAST:  55mL OMNIPAQUE IOHEXOL 350 MG/ML SOLN COMPARISON:  CT chest abdomen pelvis 08/14/2022 and PET-CT 09/09/2021 FINDINGS: VASCULAR Aorta: Endovascular repair of an abdominal aortic aneurysm with a bifurcated infrarenal aortic stent graft. Aortic stent graft is patent. The aneurysm sac measures up to 7.6 cm and measured 7.0 cm on 09/09/2021. There is contrast within the posterior proximal aspect of the aneurysm sac likely coming from lumbar arteries. Large amount of contrast within the sac on the delayed images. 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 No evidence for an  aortic sac rupture. Celiac: Patent without evidence of aneurysm, dissection, vasculitis or significant stenosis. Incidentally, there is an accessory left hepatic artery coming off the left gastric artery. SMA: Approximately 50% stenosis involving the origin and proximal aspect of the SMA related to mixed plaque. SMA is patent. Renals: Both renal arteries are patent without evidence of aneurysm, dissection, vasculitis, fibromuscular dysplasia or significant stenosis. IMA: Retrograde filling of the inferior mesenteric artery and there is contrast near the origin. 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 Inflow: Left limb terminates in the distal left common iliac artery. Left internal and external iliac arteries are patent. There is focal dilatation in the left internal iliac artery measuring up to 1.2 cm. Right limb extends into the right external iliac artery and there has been endovascular embolization of the right internal iliac artery origin. 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 Right external iliac artery is patent. Proximal Outflow: Proximal femoral arteries are patent bilaterally Veins: IVC and renal veins are patent. No gross abnormality to the iliac veins. Review of the MIP images confirms the above findings. NON-VASCULAR Lower chest: Emphysema. 8 mm nodule in the left upper lobe on sequence 7, image 28 is stable since 09/18/2019. No large pleural effusion. Again noted is  a cardiac pacemaker with a lead extending through an interatrial defect and appears to be terminating in the left ventricle. The other cardiac lead is in the right atrial appendage. Pacemaker leads appear to be chronic. Post CABG changes. Hepatobiliary: Normal appearance of the liver and gallbladder. Pancreas: Unremarkable. No pancreatic ductal dilatation or surrounding inflammatory changes. Spleen: 9 mm hyper hypodensity in the spleen appears chronic no  acute abnormality. Adrenals/Urinary Tract: Normal appearance of the adrenal glands. Both kidneys are atrophic without hydronephrosis. Low-density cyst in left kidney again noted and do not require dedicated follow-up. 9 mm stone in the left kidney upper pole. Normal appearance of the urinary bladder. Small calcification in the right kidney upper pole region could be vascular in etiology. Stomach/Bowel: Rectum is distended with gas and stool. No evidence for bowel obstruction or focal bowel inflammation. Normal appearance of the stomach. Lymphatic: No significant lymph node enlargement in the abdomen or pelvis. Reproductive: Evidence for prostatectomy. Other: Negative for free fluid.  Negative for free air. Musculoskeletal: There is a intramedullary nail in the right humerus which is incompletely imaged. Right hip arthroplasty is located. Bilateral pedicle screw and rod fixation at L5-S1. Stable anterolisthesis of L5 on S1 with disc space loss at L5-S1. Disc space narrowing at L4-L5. Chronic mild retrolisthesis of L2 on L3. IMPRESSION: VASCULAR 1. 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 The bifurcated aortic stent graft is patent but the aneurysm sac is enlarging. Aneurysm sac measures up to 7.6 cm and measured 7.0 cm on 09/09/2021. 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 2. 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 3. Unusual location for a cardiac lead that appears to be extending through in interatrial defect and terminating in the left ventricle. This is a chronic finding. NON-VASCULAR 1. No acute abnormality in the abdomen or pelvis. 2. Atrophy in both kidneys. 3. Aortic Atherosclerosis (ICD10-I70.0) and Emphysema (ICD10-J43.9). 4. Nonobstructive left nephrolithiasis. Electronically Signed: By: Markus Daft M.D. 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  Preoperative cardiovascular assessment.  Based on patients age and comorbidities, the patient poses at least mild to moderate risk for cardiovascular complication during surgical repair of abdominal aortic aneurysm.  The patient appears to be medically optimized.  Patient denies chest pain.  Dual chamber pacemaker appears to be functioning appropriately.  High-sensitivity troponin borderline elevated in the absence of chest pain, most likely elevated due to underlying chronic kidney disease. 2.  Known CAD, CABG x3, currently without chest pain 3.  Abdominal aortic aneurysm, pending surgical repair 4.  Status post dual-chamber pacemaker for second-degree  AV block, with CTA abdomen pelvis showing unusual lead placement extending through an interarterial defect and terminating in the left ventricle, surface echo pending today 5.  Chronic kidney disease, stage IIIb 6.  Severe back pain, uncertain etiology 7.  E. Faecalis bacteremia, 2D echocardiogram pending, ID recommends TEE to rule out pacer lead vegetation/infection   Recommendations   1.  Agree with current therapy 2.  Transthoracic echocardiogram ordered today, pending 3.  TEE tentatively Wednesday 10/11. (Need to  coordinate with Dr. Nehemiah Massed) Ongoing discussions regarding whether pacemaker lead extraction, etc remain within his goals of care.  4. vascular surgery is recommending proceeding with EVAR this afternoon 10/9  This patient's plan of care was discussed and created with Dr. Saralyn Pilar and he is in agreement.    Tristan Schroeder, PA-C 08/16/2022 11:23 AM

## 2022-08-17 ENCOUNTER — Inpatient Hospital Stay
Admit: 2022-08-17 | Discharge: 2022-08-17 | Disposition: A | Discharge status: Home / Self Care | Payer: PPO | Attending: Vascular Surgery | Admitting: Vascular Surgery

## 2022-08-17 ENCOUNTER — Encounter: Payer: Self-pay | Admitting: Vascular Surgery

## 2022-08-17 ENCOUNTER — Inpatient Hospital Stay: Payer: PPO

## 2022-08-17 DIAGNOSIS — I714 Abdominal aortic aneurysm, without rupture, unspecified: Secondary | ICD-10-CM | POA: Diagnosis not present

## 2022-08-17 DIAGNOSIS — B952 Enterococcus as the cause of diseases classified elsewhere: Secondary | ICD-10-CM | POA: Diagnosis not present

## 2022-08-17 DIAGNOSIS — M5136 Other intervertebral disc degeneration, lumbar region: Secondary | ICD-10-CM

## 2022-08-17 DIAGNOSIS — M47816 Spondylosis without myelopathy or radiculopathy, lumbar region: Secondary | ICD-10-CM

## 2022-08-17 DIAGNOSIS — I723 Aneurysm of iliac artery: Secondary | ICD-10-CM | POA: Diagnosis not present

## 2022-08-17 DIAGNOSIS — M545 Low back pain, unspecified: Secondary | ICD-10-CM | POA: Diagnosis not present

## 2022-08-17 DIAGNOSIS — I2699 Other pulmonary embolism without acute cor pulmonale: Secondary | ICD-10-CM | POA: Diagnosis not present

## 2022-08-17 DIAGNOSIS — T17500A Unspecified foreign body in bronchus causing asphyxiation, initial encounter: Secondary | ICD-10-CM

## 2022-08-17 DIAGNOSIS — R7881 Bacteremia: Secondary | ICD-10-CM | POA: Diagnosis not present

## 2022-08-17 LAB — CULTURE, BLOOD (ROUTINE X 2)
Special Requests: ADEQUATE
Special Requests: ADEQUATE

## 2022-08-17 LAB — COMPREHENSIVE METABOLIC PANEL
ALT: 15 U/L (ref 0–44)
AST: 16 U/L (ref 15–41)
Albumin: 2.7 g/dL — ABNORMAL LOW (ref 3.5–5.0)
Alkaline Phosphatase: 52 U/L (ref 38–126)
Anion gap: 7 (ref 5–15)
BUN: 28 mg/dL — ABNORMAL HIGH (ref 8–23)
CO2: 22 mmol/L (ref 22–32)
Calcium: 8.4 mg/dL — ABNORMAL LOW (ref 8.9–10.3)
Chloride: 109 mmol/L (ref 98–111)
Creatinine, Ser: 2.06 mg/dL — ABNORMAL HIGH (ref 0.61–1.24)
GFR, Estimated: 32 mL/min — ABNORMAL LOW (ref >60)
Glucose, Bld: 88 mg/dL (ref 70–99)
Potassium: 3.9 mmol/L (ref 3.5–5.1)
Sodium: 138 mmol/L (ref 135–145)
Total Bilirubin: 0.7 mg/dL (ref 0.3–1.2)
Total Protein: 5.2 g/dL — ABNORMAL LOW (ref 6.5–8.1)

## 2022-08-17 LAB — HEPARIN LEVEL (UNFRACTIONATED): Heparin Unfractionated: 0.19 IU/mL — ABNORMAL LOW (ref 0.30–0.70)

## 2022-08-17 LAB — ECHOCARDIOGRAM COMPLETE
Height: 71 in
S' Lateral: 3.3 cm
Weight: 3037.06 oz

## 2022-08-17 LAB — CBC
HCT: 26.1 % — ABNORMAL LOW (ref 39.0–52.0)
Hemoglobin: 8 g/dL — ABNORMAL LOW (ref 13.0–17.0)
MCH: 28 pg (ref 26.0–34.0)
MCHC: 30.7 g/dL (ref 30.0–36.0)
MCV: 91.3 fL (ref 80.0–100.0)
Platelets: 136 10*3/uL — ABNORMAL LOW (ref 150–400)
RBC: 2.86 MIL/uL — ABNORMAL LOW (ref 4.22–5.81)
RDW: 15.7 % — ABNORMAL HIGH (ref 11.5–15.5)
WBC: 5.5 10*3/uL (ref 4.0–10.5)
nRBC: 0 % (ref 0.0–0.2)

## 2022-08-17 LAB — LEGIONELLA PNEUMOPHILA SEROGP 1 UR AG: L. pneumophila Serogp 1 Ur Ag: NEGATIVE

## 2022-08-17 LAB — APTT: aPTT: 32 seconds (ref 24–36)

## 2022-08-17 LAB — PROTIME-INR
INR: 1.2 (ref 0.8–1.2)
Prothrombin Time: 14.9 seconds (ref 11.4–15.2)

## 2022-08-17 MED ORDER — HEPARIN (PORCINE) 25000 UT/250ML-% IV SOLN
1400.0000 [IU]/h | INTRAVENOUS | Status: DC
  Administered 2022-08-17: 1400 [IU]/h via INTRAVENOUS
  Filled 2022-08-17: qty 250

## 2022-08-17 MED ORDER — HYDROMORPHONE HCL 1 MG/ML IJ SOLN
1.0000 mg | INTRAMUSCULAR | Status: DC | PRN
  Administered 2022-08-17 – 2022-08-31 (×31): 1 mg via INTRAVENOUS
  Filled 2022-08-17 (×35): qty 1

## 2022-08-17 MED ORDER — HEPARIN SODIUM (PORCINE) 5000 UNIT/ML IJ SOLN
5000.0000 [IU] | Freq: Three times a day (TID) | INTRAMUSCULAR | Status: DC
  Administered 2022-08-17 – 2022-09-01 (×45): 5000 [IU] via SUBCUTANEOUS
  Filled 2022-08-17 (×45): qty 1

## 2022-08-17 MED ORDER — OXYCODONE-ACETAMINOPHEN 5-325 MG PO TABS
2.0000 | ORAL_TABLET | Freq: Four times a day (QID) | ORAL | Status: DC
  Administered 2022-08-17 – 2022-09-01 (×58): 2 via ORAL
  Filled 2022-08-17 (×60): qty 2

## 2022-08-17 MED ORDER — POLYETHYLENE GLYCOL 3350 17 G PO PACK
17.0000 g | PACK | Freq: Two times a day (BID) | ORAL | Status: DC
  Administered 2022-08-17 – 2022-08-29 (×22): 17 g via ORAL
  Filled 2022-08-17 (×23): qty 1

## 2022-08-17 MED ORDER — IOHEXOL 350 MG/ML SOLN
60.0000 mL | Freq: Once | INTRAVENOUS | Status: AC | PRN
  Administered 2022-08-17: 60 mL via INTRAVENOUS

## 2022-08-17 MED ORDER — HEPARIN BOLUS VIA INFUSION
5000.0000 [IU] | Freq: Once | INTRAVENOUS | Status: AC
  Administered 2022-08-17: 5000 [IU] via INTRAVENOUS
  Filled 2022-08-17: qty 5000

## 2022-08-17 MED ORDER — SENNOSIDES-DOCUSATE SODIUM 8.6-50 MG PO TABS
2.0000 | ORAL_TABLET | Freq: Two times a day (BID) | ORAL | Status: DC
  Administered 2022-08-17 – 2022-08-29 (×23): 2 via ORAL
  Filled 2022-08-17 (×25): qty 2

## 2022-08-17 MED ORDER — CLOPIDOGREL BISULFATE 75 MG PO TABS: 75.0000 mg | ORAL_TABLET | Freq: Every day | ORAL | Status: DC

## 2022-08-17 MED ORDER — CLOPIDOGREL BISULFATE 75 MG PO TABS
75.0000 mg | ORAL_TABLET | Freq: Every day | ORAL | Status: DC
  Administered 2022-08-17 – 2022-09-01 (×16): 75 mg via ORAL
  Filled 2022-08-17 (×16): qty 1

## 2022-08-17 NOTE — Progress Notes (Signed)
*  PRELIMINARY RESULTS* Echocardiogram 2D Echocardiogram has been performed.  Sherrie Sport 08/17/2022, 7:52 AM

## 2022-08-17 NOTE — Progress Notes (Addendum)
PROGRESS NOTE    Jibreel T [employer].   EHM:[id number] DOB: 1940/12/11  DOA: 08/14/2022 Date of Service: 08/17/22 PCP: Idelle Crouch, MD     Brief Narrative / Hospital Course:  Madden T [place] Rico. is a 81 y.o. 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 Patient had surgery and finished SNF rehab. Currently he is doing PT/OT at home), chronic lower back pain (L5-S1 fusion), who presents with lower back pain and shortness breath. Pt had negative PE by V/Q scan which is ordered by his PCP day PTA, which was done for persistent SOB postoperatively. 10/07: WBC 3.8, negative COVID PCR, lactic acid 0.9, troponin level 31, 30, slightly worsening renal function with creatinine 2.69, BUN 31, GFR 23 (recent baseline creatinine 2.39 06/15/2022), temperature normal, blood pressure 172/84, 129/64, RR 83, heart rate 83, RR 17, oxygen saturation 98% on room air.  Chest x-ray showed possible mild patchy infiltration in left lower lobe.  CT head negative.  CT scan showed enlargement of AAA from previous 6.9 to 7.6 cm now.  Patient is admitted to telemetry bed as inpatient. Dr. Lorenso Courier of [facility] was consulted re: AAA, needs CTA, nephrology consulted and recommended IV fluids prior to CTA of Abd/Pelvis. Cardiology also consulted for surgical optimization/risk stratification - medically optimized at this time, troponin mild elevation likely d/t CKD4. BCx (+) E Faecalis, ID recommended r/o endocarditis/pacer infection w/ TEE and vascular surgery to assess re: graft involvement, changed Abx to ampicillin and repeat Bcx, also recommend MRI  L-spine or CT w/ contrast, pt has pacemaker   10/08: Pt and wife agreeable to TEE and MRI, though MRI would have to be at [facility]. Per vascular: "Noted bacteremia- however, patient with chronic progressive lower back pain; expanding aortic sac may be contributory to pain- not definitive. Therefore, preferable to intervene sooner than later." Per ID, would consider in order of importance to 1) treat bacteremia, 2) address aorto-bifem leak, 3) TEE, 4) MRI/CT L-spine. IF we decide to do CT, this can be done in-house, vs RI would require transfer. Will plan to proceed w/ IV abx, do vascular procedure tomorrow as scheduled, and see how he progresses from there.  10/09: Vascular procedure today to fix graft.  Radiology reached out this morning, concern for small PE noted on CTA abdomen/pelvis.  Holding off on full CTA chest at this time due to concern with contrast/renal function but will plan for heparin. 10/10: Renal function okay. CT w/ contrast lumbar spine - no osteomyelitis noted. CTA chest no PE but Significant mucus within RIGHT mainstem bronchus extending into bronchus intermedius and RIGHT lower lobe. Continue IV fluids and recheck BMP in AM. TEE planned for 10/12.    Consultants:  Vascular surgery - TAA, AAA enlarged, R common iliac aneurysm Nephrology - CKD4 needing contrast study Cardiology - clearance for vascular procedure given CAD/CABG, Chronic HFpEF ID - (+)Bcx  Procedures: 08/16/2022: Repair of aortic aneurysm sac type II endoleak  Pertinent Results: 08/14/22: (+)BCx, G(+)cocci --> E Faecalis  08/14/22: CTA Abd/Pelvis - 1. 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 The bifurcated aortic stent graft is patent but the aneurysm sac is enlarging. Aneurysm sac  measures up to 7.6 cm and measured 7.0 cm on 09/09/2021. 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 2. 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  3. Unusual location for a cardiac lead that appears to be extending through in interatrial defect and terminating in the left ventricle. This is a chronic finding.     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 Lucky Cowboy. Now the size has enlarged from 6.9 to 7.6 cm S/p repair 08/16/2022 w/ Dr Lucky Cowboy  Severe back pain Lumbar OA, DDD Multiple lobar spine hardware Pain control as able  May consider neurosurgery consult   CAD S/P CABG x 3 Hx of CAD s/p of CABG and myocardial injury due to COPD exacerbation:  Myocardial injury less likely vs Troponin elevation d/t CKD4 Troponin level 31, 30.  Denies chest pain. Continue home Lipitor, Zetia hold ASA consulted Dr. Saralyn Pilar of cardiology per Dr. Dolores Hoose recommendation - optimized from cardiac standpoint, though risk is present. Troponins likely d/t CKD  Low back pain This seems to be a chronic issue, has worsened recently. CT scan showed chronic L5-S1 fusion hardware, with  chronic L5 spondylolysis and grade 2 L5-S1 spondylolisthesis, unchanged. There is osteopenia degenerative change of the lumbar spine with discogenic mild grade 1 retrolisthesis unchanged at L1-2 and L2-3, slight dextroscoliosis. pain control: As needed Percocet, Tylenol, Dilaudid --> scheduled po opiates w/ prn Dilaudid As needed Robaxin Lidoderm MRI L Spine per ID recs given (+)Bcx -patient has pacemaker, family would like to avoid transferring to different facility if possible.   We will plan for CT with contrast today   SOB w/ mucus plugging of bronchi noted on CT PE resolved on CTA (recently diagnosed, see above)  Transition heparin to DVT ppx dose now that no PE Given SOB, may consider pulmonary consult to review CT and eval if bronchoscopy warranted  Chronic kidney disease, stage IV (severe) (HCC) Slightly worsening than baseline. Today 08/17/22 Cr 2.06 (vs 2.69 on admission) Monitor renal function closely by [facility] following - see note for recs re: contrast administration  Planning further contrast studies to eval PE and lumbar spine - increased fluids for now to 100 mL/h and plan to reduce this pending renal function tomorrow.   Positive blood cultures - E Facealis Bacteremia ID to follow Ampicillin Needs TEE - planned for 10/12 Needs MRI L spine - see above, getting  CT Lumbar w/ contrast today   Possible COPD exacerbation (HCC) - seems less likely given CT findings from today 10/10 see above Patient is a former smoker, smoked more than 20 years.  CT chest did not show focal infiltration, does not seem to have pneumonia.  Procalcitonin elevated at 0.39.   Patient does not meet criteria for sepsis. will admit to tele bed as inpatient Bronchodilators pt was given 80 mg of Solu-Medrol in ED --> will change to oral prednisone 40 mg daily Z pak  Mucinex for cough  Incentive spirometry Urine S. Pneumococcal and legionella antigen sputum culture Nasal cannula oxygen as  needed to maintain O2 saturation 93% or greater if pt desat Consider pulmonary consult   Essential hypertension IV hydralazine as needed Metoprolol hold Cozaar since pt will receive contrast   Chronic diastolic CHF (congestive heart failure) ([place]) 2D echo on 06/14/2022 showed EF of 60 to 65% with grade 1 diastolic dysfunction.   CHF seem to be compensated. Check BNP  HLD (hyperlipidemia) Continue to Lipitor and zetia  Iron deficiency anemia Hemoglobin 8.6 (9.7 on 06/15/2022, and 8.6 on 06/13/2022), no active bleeding. Follow-up with CBC Continue iron supplement  History of TIA (transient ischemic attack) Continue Lipitor Hold ASA and Plavix with pending vascular surgeon's evaluation of his AAA.  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  No acute issues.  Obesity (BMI 30-39.9) BMI= 30.68  and BW= 99.8 Diet and exercise.   Encourage to lose weight.  Anxiety start prn Xanax 0.25 mg bid    DVT prophylaxis: holding pharmacologic given bleed risk  Pertinent IV fluids/nutrition: IV fluids NS 100 mL/h Central lines / invasive devices: none  Code Status: DNR Family Communication: wife at bedside on rounds  Disposition: inpatient TOC needs: pending clinical progression  Barriers to discharge / significant pending items: see A/P, anticipate lengthy stay / pending results.              Subjective:  Patient reports back pain is severe, requiring more DIlaudid, slept very little last night.  No chest pain, dizziness/headache, shortness of breath is stable he is not needing oxygen.       Objective:  Vitals:   08/17/22 0435 08/17/22 0815 08/17/22 1152 08/17/22 1613  BP: (!) 147/84 (!) 159/70 131/72 138/76  Pulse: 88 70 76 71  Resp: 19 16 18 16   Temp: 98.1 F (36.7 C) 98.3 F (36.8 C) 98.3 F (36.8 C) 98 F (36.7 C)  TempSrc:  Oral    SpO2: 99% 94% 100%  99%  Weight:      Height:        Intake/Output Summary (Last 24 hours) at 08/17/2022 1856 Last data filed at 08/17/2022 1543 Gross per 24 hour  Intake 2867.99 ml  Output 550 ml  Net 2317.99 ml   Filed Weights   08/14/22 0232 08/15/22 0500 08/16/22 0226  Weight: 99.8 kg 85.4 kg 86.1 kg    Examination:  Constitutional:  VS as above General Appearance: alert, well-developed, well-nourished, NAD Respiratory: Normal respiratory effort No wheeze No rhonchi No rales Cardiovascular: S1/S2 normal RRR No murmur No lower extremity edema Gastrointestinal: No tenderness Musculoskeletal:  Symmetrical movement in all extremities Neurological: No cranial nerve deficit on limited exam Alert Psychiatric: Normal judgment/insight Normal mood and affect       Scheduled Medications:   atorvastatin  40 mg Oral QHS   calcitRIOL  0.25 mcg  Oral q AM   clopidogrel  75 mg Oral Daily   donepezil  10 mg Oral QHS   ezetimibe  10 mg Oral QPM   ferrous sulfate  325 mg Oral Q2000   ipratropium  2-4 spray Each Nare QHS   lidocaine  1 patch Transdermal Q24H   loratadine  10 mg Oral Daily   melatonin  10 mg Oral QHS   methocarbamol  500 mg Oral Q2000   metoprolol succinate  25 mg Oral Daily   multivitamin with minerals  1 tablet Oral QPC lunch   oxyCODONE-acetaminophen  2 tablet Oral Q6H   pantoprazole  40 mg Oral QAC breakfast   polyethylene glycol  17 g Oral BID   predniSONE  40 mg Oral Q breakfast   senna-docusate  2 tablet Oral BID   zolpidem  10 mg Oral QHS    Continuous Infusions:  sodium chloride 100 mL/hr at 08/17/22 1304   ampicillin (OMNIPEN) IV 2 g (08/17/22 1429)   cefTRIAXone (ROCEPHIN)  IV 2 g (08/17/22 0913)   heparin 1,400 Units/hr (08/17/22 1433)    PRN Medications:  albuterol, ALPRAZolam, dextromethorphan-guaiFENesin, hydrALAZINE, HYDROmorphone (DILAUDID) injection, methocarbamol, ondansetron (ZOFRAN) IV  Antimicrobials:  Anti-infectives (From admission,  onward)    Start     Dose/Rate Route Frequency Ordered Stop   08/16/22 1400  cefTRIAXone (ROCEPHIN) 2 g in sodium chloride 0.9 % 100 mL IVPB        2 g 200 mL/hr over 30 Minutes Intravenous Every 12 hours 08/16/22 1231     08/15/22 1000  azithromycin (ZITHROMAX) tablet 250 mg  Status:  Discontinued       See Hyperspace for full Linked Orders Report.   250 mg Oral Daily 08/14/22 0822 08/16/22 1926   08/14/22 2200  ampicillin (OMNIPEN) 2 g in sodium chloride 0.9 % 100 mL IVPB        2 g 300 mL/hr over 20 Minutes Intravenous Every 8 hours 08/14/22 2037     08/14/22 1000  azithromycin (ZITHROMAX) tablet 500 mg       See Hyperspace for full Linked Orders Report.   500 mg Oral Daily 08/14/22 0822 08/14/22 0944       Data Reviewed: I have personally reviewed following labs and imaging studies  CBC: Recent Labs  Lab 08/14/22 0247 08/15/22 0440 08/16/22 0403 08/17/22 0530  WBC 3.8* 4.4 5.4 5.5  HGB 8.6* 7.8* 7.9* 8.0*  HCT 28.6* 25.5* 25.4* 26.1*  MCV 92.9 90.7 91.0 91.3  PLT 136* 149* 155 937*   Basic Metabolic Panel: Recent Labs  Lab 08/14/22 0247 08/15/22 0440 08/16/22 0403 08/17/22 0530  NA 137 137 138 138  K 4.6 4.3 4.5 3.9  CL 103 108 109 109  CO2 24 24 22 22   GLUCOSE 104* 104* 106* 88  BUN 31* 32* 30* 28*  CREATININE 2.69* 2.33* 2.10* 2.06*  CALCIUM 9.1 8.7* 8.6* 8.4*   GFR: Estimated Creatinine Clearance: 30 mL/min (A) (by C-G formula based on SCr of 2.06 mg/dL (H)). Liver Function Tests: Recent Labs  Lab 08/17/22 0530  AST 16  ALT 15  ALKPHOS 52  BILITOT 0.7  PROT 5.2*  ALBUMIN 2.7*   No results for input(s): "LIPASE", "AMYLASE" in the last 168 hours. No results for input(s): "AMMONIA" in the last 168 hours. Coagulation Profile: Recent Labs  Lab 08/14/22 1958 08/17/22 1314  INR 1.1 1.2   Cardiac Enzymes: No results for input(s): "CKTOTAL", "CKMB", "CKMBINDEX", "TROPONINI" in the last 168 hours. BNP (  last 3 results) No results for input(s):  "PROBNP" in the last 8760 hours. HbA1C: No results for input(s): "HGBA1C" in the last 72 hours. CBG: No results for input(s): "GLUCAP" in the last 168 hours. Lipid Profile: No results for input(s): "CHOL", "HDL", "LDLCALC", "TRIG", "CHOLHDL", "LDLDIRECT" in the last 72 hours. Thyroid Function Tests: No results for input(s): "TSH", "T4TOTAL", "FREET4", "T3FREE", "THYROIDAB" in the last 72 hours. Anemia Panel: No results for input(s): "VITAMINB12", "FOLATE", "FERRITIN", "TIBC", "IRON", "RETICCTPCT" in the last 72 hours. Urine analysis:    Component Value Date/Time   COLORURINE YELLOW (A) 08/14/2022 0654   APPEARANCEUR CLEAR (A) 08/14/2022 0654   APPEARANCEUR Cloudy (A) 03/09/2021 1332   LABSPEC 1.017 08/14/2022 0654   LABSPEC 1.015 10/25/2014 1330   PHURINE 6.0 08/14/2022 0654   GLUCOSEU NEGATIVE 08/14/2022 0654   GLUCOSEU Negative 10/25/2014 1330   HGBUR NEGATIVE 08/14/2022 0654   BILIRUBINUR NEGATIVE 08/14/2022 0654   BILIRUBINUR Negative 03/09/2021 1332   BILIRUBINUR Negative 10/25/2014 [place] 08/14/2022 0654   PROTEINUR 30 (A) 08/14/2022 0654   NITRITE NEGATIVE 08/14/2022 0654   LEUKOCYTESUR NEGATIVE 08/14/2022 0654   LEUKOCYTESUR Negative 10/25/2014 1330   Sepsis Labs: @LABRCNTIP (procalcitonin:4,lacticidven:4)  Recent Results (from the past 240 hour(s))  Culture, blood (routine x 2)     Status: Abnormal   Collection Time: 08/14/22  3:57 AM   Specimen: BLOOD  Result Value Ref Range Status   Specimen Description   Final    BLOOD BLOOD RIGHT ARM Performed at [facility], [address], [place], [place] [address]    Special Requests   Final    BOTTLES DRAWN AEROBIC AND ANAEROBIC Blood Culture adequate volume Performed at [facility], [address] [place], [place] [address]    Culture  Setup Time   Final    Organism ID to follow [place] TO, READ BACK  BY AND VERIFIED WITH: PHARMD CHILDS AT 1957 08/14/2022 GAA Performed at [facility], [address] [place], [place] [address]    Culture ENTEROCOCCUS FAECALIS (A)  Final   Report Status 08/17/2022 FINAL  Final   Organism ID, Bacteria ENTEROCOCCUS FAECALIS  Final      Susceptibility   Enterococcus faecalis - MIC*    AMPICILLIN <=2 SENSITIVE Sensitive     VANCOMYCIN 1 SENSITIVE Sensitive     GENTAMICIN SYNERGY SENSITIVE Sensitive     * ENTEROCOCCUS FAECALIS  Culture, blood (routine x 2)     Status: Abnormal   Collection Time: 08/14/22  3:57 AM   Specimen: BLOOD  Result Value Ref Range Status   Specimen Description   Final    BLOOD BLOOD LEFT ARM Performed at [facility], [address] [place], [place] [address]    Special Requests   Final    BOTTLES DRAWN AEROBIC AND ANAEROBIC Blood Culture adequate volume Performed at [facility], [address] [place], [place] [address]    Culture  Setup Time   Final    GRAM POSITIVE COCCI AEROBIC BOTTLE ONLY CRITICAL RESULT CALLED TO, READ BACK BY AND VERIFIED WITH: PHARMD CHILDS AT [place] 08/14/2022 GAA GRAM STAIN REVIEWED-AGREE WITH RESULT Performed at [facility], [address] [place],  [place] [address]    Culture (A)  Final    ENTEROCOCCUS FAECALIS SUSCEPTIBILITIES PERFORMED ON PREVIOUS CULTURE WITHIN THE LAST 5 DAYS. Performed at [facility], [place] [address] [place],  [address]  Report Status 08/17/2022 FINAL  Final  Resp Panel by RT-PCR (Flu A&B, Covid) Anterior Nasal Swab     Status: None   Collection Time: 08/14/22  3:57 AM   Specimen: Anterior Nasal Swab  Result Value Ref Range Status   SARS Coronavirus 2 by RT PCR NEGATIVE NEGATIVE Final    Comment: (NOTE) SARS-CoV-2 target nucleic acids are NOT DETECTED.  The SARS-CoV-2 RNA is generally detectable in upper respiratory specimens during the acute phase of infection. The lowest concentration of SARS-CoV-2 viral  copies this assay can detect is 138 copies/mL. A negative result does not preclude SARS-Cov-2 infection and should not be used as the sole basis for treatment or other patient management decisions. A negative result may occur with  improper specimen collection/handling, submission of specimen other than nasopharyngeal swab, presence of viral mutation(s) within the areas targeted by this assay, and inadequate number of viral copies(<138 copies/mL). A negative result must be combined with clinical observations, patient history, and epidemiological information. The expected result is Negative.  Fact Sheet for Patients:  EntrepreneurPulse.com.au  Fact Sheet for Healthcare Providers:  IncredibleEmployment.be  This test is no t yet approved or cleared by the [place] FDA and  has been authorized for detection and/or diagnosis of SARS-CoV-2 by FDA under an Emergency Use Authorization (EUA). This EUA will remain  in effect (meaning this test can be used) for the duration of the COVID-19 declaration under Section 564(b)(1) of the Act, 21 U.S.C.section 360bbb-3(b)(1), unless the authorization is terminated  or revoked sooner.       Influenza A by PCR NEGATIVE NEGATIVE Final   Influenza B by PCR NEGATIVE NEGATIVE Final    Comment: (NOTE) The Xpert Xpress SARS-CoV-2/FLU/RSV plus assay is intended as an aid in the diagnosis of influenza from Nasopharyngeal swab specimens and should not be used as a sole basis for treatment. Nasal washings and aspirates are unacceptable for Xpert Xpress SARS-CoV-2/FLU/RSV testing.  Fact Sheet for Patients: EntrepreneurPulse.com.au  Fact Sheet for Healthcare Providers: IncredibleEmployment.be  This test is not yet approved or cleared by the [place] FDA and has been authorized for detection and/or diagnosis of SARS-CoV-2 by FDA under an Emergency Use Authorization (EUA). This  EUA will remain in effect (meaning this test can be used) for the duration of the COVID-19 declaration under Section 564(b)(1) of the Act, 21 U.S.C. section 360bbb-3(b)(1), unless the authorization is terminated or revoked.  Performed at [facility], [address] [place], [place] [address]   Blood Culture ID Panel (Reflexed)     Status: Abnormal   Collection Time: 08/14/22  3:57 AM  Result Value Ref Range Status   Enterococcus faecalis DETECTED (A) NOT DETECTED Final    Comment: CRITICAL RESULT CALLED TO, READ BACK BY AND VERIFIED WITH: PHARMD CHILDS AT 1957 08/14/2022 GAA    Enterococcus Faecium NOT DETECTED NOT DETECTED Final   Listeria monocytogenes NOT DETECTED NOT DETECTED Final   Staphylococcus species NOT DETECTED NOT DETECTED Final   Staphylococcus aureus (BCID) NOT DETECTED NOT DETECTED Final   Staphylococcus epidermidis NOT DETECTED NOT DETECTED Final   Staphylococcus lugdunensis NOT DETECTED NOT DETECTED Final   Streptococcus species NOT DETECTED NOT DETECTED Final   Streptococcus agalactiae NOT DETECTED NOT DETECTED Final   Streptococcus pneumoniae NOT DETECTED NOT DETECTED Final   Streptococcus pyogenes NOT DETECTED NOT DETECTED Final   A.calcoaceticus-baumannii NOT DETECTED NOT DETECTED Final   Bacteroides fragilis NOT DETECTED NOT DETECTED Final   Enterobacterales NOT DETECTED NOT DETECTED Final  Enterobacter cloacae complex NOT DETECTED NOT DETECTED Final   Escherichia coli NOT DETECTED NOT DETECTED Final   Klebsiella aerogenes NOT DETECTED NOT DETECTED Final   Klebsiella oxytoca NOT DETECTED NOT DETECTED Final   Klebsiella pneumoniae NOT DETECTED NOT DETECTED Final   Proteus species NOT DETECTED NOT DETECTED Final   Salmonella species NOT DETECTED NOT DETECTED Final   Serratia marcescens NOT DETECTED NOT DETECTED Final   Haemophilus influenzae NOT DETECTED NOT DETECTED Final   Neisseria meningitidis NOT DETECTED NOT DETECTED Final   Pseudomonas  aeruginosa NOT DETECTED NOT DETECTED Final   Stenotrophomonas maltophilia NOT DETECTED NOT DETECTED Final   Candida albicans NOT DETECTED NOT DETECTED Final   Candida auris NOT DETECTED NOT DETECTED Final   Candida glabrata NOT DETECTED NOT DETECTED Final   Candida krusei NOT DETECTED NOT DETECTED Final   Candida parapsilosis NOT DETECTED NOT DETECTED Final   Candida tropicalis NOT DETECTED NOT DETECTED Final   Cryptococcus neoformans/gattii NOT DETECTED NOT DETECTED Final   Vancomycin resistance NOT DETECTED NOT DETECTED Final    Comment: Performed at [facility], [address], [place], [place] [address]  Urine Culture     Status: Abnormal   Collection Time: 08/14/22  6:54 AM   Specimen: Urine, Clean Catch  Result Value Ref Range Status   Specimen Description   Final    URINE, CLEAN CATCH Performed at [facility], [address] [place], [place] [address]    Special Requests   Final    NONE Performed at [facility], [address] [place], [place] [address]    Culture >=100,000 COLONIES/mL ENTEROCOCCUS FAECALIS (A)  Final   Report Status 08/16/2022 FINAL  Final   Organism ID, Bacteria ENTEROCOCCUS FAECALIS (A)  Final      Susceptibility   Enterococcus faecalis - MIC*    AMPICILLIN <=2 SENSITIVE Sensitive     NITROFURANTOIN <=16 SENSITIVE Sensitive     VANCOMYCIN 1 SENSITIVE Sensitive     * >=100,000 COLONIES/mL ENTEROCOCCUS FAECALIS  Culture, blood (Routine X 2) w Reflex to ID Panel     Status: None (Preliminary result)   Collection Time: 08/15/22 12:22 PM   Specimen: Right Antecubital; Blood  Result Value Ref Range Status   Specimen Description RIGHT ANTECUBITAL  Final   Special Requests   Final    BOTTLES DRAWN AEROBIC AND ANAEROBIC Blood Culture adequate volume   Culture   Final    NO GROWTH 2 DAYS Performed at [facility], [address] [place],  [address]    Report Status PENDING  Incomplete  Culture, blood  (Routine X 2) w Reflex to ID Panel     Status: None (Preliminary result)   Collection Time: 08/15/22 12:22 PM   Specimen: BLOOD RIGHT ARM  Result Value Ref Range Status   Specimen Description BLOOD RIGHT ARM  Final   Special Requests   Final    BOTTLES DRAWN AEROBIC AND ANAEROBIC Blood Culture adequate volume   Culture   Final    NO GROWTH 2 DAYS Performed at [facility], [address] [place],  [address]    Report Status PENDING  Incomplete         Radiology Studies: CT Angio Abd/Pel w/ and/or w/o  Result Date: 08/14/2022 CLINICAL DATA:  History of endovascular repair of an abdominal aortic aneurysm. Evaluate abdominal aortic aneurysm sac. EXAM: CTA ABDOMEN AND PELVIS WITHOUT AND WITH CONTRAST TECHNIQUE: Multidetector CT imaging of the abdomen and pelvis  was performed using the standard protocol during bolus administration of intravenous contrast. Multiplanar reconstructed images and MIPs were obtained and reviewed to evaluate the vascular anatomy. RADIATION DOSE REDUCTION: This exam was performed according to the departmental dose-optimization program which includes automated exposure control, adjustment of the mA and/or kV according to patient size and/or use of iterative reconstruction technique. CONTRAST:  16mL OMNIPAQUE IOHEXOL 350 MG/ML SOLN COMPARISON:  CT chest abdomen pelvis 08/14/2022 and PET-CT 09/09/2021 FINDINGS: VASCULAR Aorta: Endovascular repair of an abdominal aortic aneurysm with a bifurcated infrarenal aortic stent graft. Aortic stent graft is patent. The aneurysm sac measures up to 7.6 cm and measured 7.0 cm on 09/09/2021. There is contrast within the posterior proximal aspect of the aneurysm sac likely coming from lumbar arteries. Large amount of contrast within the sac on the delayed images. 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 No evidence for an aortic sac rupture. Celiac: Patent without evidence of aneurysm, dissection, vasculitis or  significant stenosis. Incidentally, there is an accessory left hepatic artery coming off the left gastric artery. SMA: Approximately 50% stenosis involving the origin and proximal aspect of the SMA related to mixed plaque. SMA is patent. Renals: Both renal arteries are patent without evidence of aneurysm, dissection, vasculitis, fibromuscular dysplasia or significant stenosis. IMA: Retrograde filling of the inferior mesenteric artery and there is contrast near the origin. 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 Inflow: Left limb terminates in the distal left common iliac artery. Left internal and external iliac arteries are patent. There is focal dilatation in the left internal iliac artery measuring up to 1.2 cm. Right limb extends into the right external iliac artery and there has been endovascular embolization of the right internal iliac artery origin. 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 Right external iliac artery is patent. Proximal Outflow: Proximal femoral arteries are patent bilaterally Veins: IVC and renal veins are patent. No gross abnormality to the iliac veins. Review of the MIP images confirms the above findings. NON-VASCULAR Lower chest: Emphysema. 8 mm nodule in the left upper lobe on sequence 7, image 28 is stable since 09/18/2019. No large pleural effusion. Again noted is a cardiac pacemaker with a lead extending through an interatrial defect and appears to be terminating in the left ventricle. The other cardiac lead is in the right atrial appendage. Pacemaker leads appear to be chronic. Post CABG changes. Hepatobiliary: Normal appearance of the liver and gallbladder. Pancreas: Unremarkable. No pancreatic ductal dilatation or surrounding inflammatory changes. Spleen: 9 mm hyper hypodensity in the spleen appears chronic no acute abnormality. Adrenals/Urinary Tract: Normal appearance of the adrenal glands. Both  kidneys are atrophic without hydronephrosis. Low-density cyst in left kidney again noted and do not require dedicated follow-up. 9 mm stone in the left kidney upper pole. Normal appearance of the urinary bladder. Small calcification in the right kidney upper pole region could be vascular in etiology. Stomach/Bowel: Rectum is distended with gas and stool. No evidence for bowel obstruction or focal bowel inflammation. Normal appearance of the stomach. Lymphatic: No significant lymph node enlargement in the abdomen or pelvis. Reproductive: Evidence for prostatectomy. Other: Negative for free fluid.  Negative for free air. Musculoskeletal: There is a intramedullary nail in the right humerus which is incompletely imaged. Right hip arthroplasty is located. Bilateral pedicle screw and rod fixation at L5-S1. Stable anterolisthesis of L5 on S1 with disc space loss at L5-S1. Disc space  narrowing at L4-L5. Chronic mild retrolisthesis of L2 on L3. IMPRESSION: VASCULAR 1. 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 The bifurcated aortic stent graft is patent but the aneurysm sac is enlarging. Aneurysm sac measures up to 7.6 cm and measured 7.0 cm on 09/09/2021. 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 2. 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 3. Unusual location for a cardiac lead that appears to be extending through in interatrial defect and terminating in the left ventricle. This is a chronic finding. NON-VASCULAR 1. No acute abnormality in the abdomen or pelvis. 2. Atrophy in both kidneys. 3. Aortic Atherosclerosis (ICD10-I70.0) and Emphysema (ICD10-J43.9). 4. Nonobstructive left nephrolithiasis. Electronically Signed   By: Markus Daft M.D.   On: 08/14/2022 15:29   CT CHEST ABDOMEN PELVIS WO CONTRAST  Result Date: 08/14/2022 CLINICAL DATA:  Pneumonia, complications suspected. Low back pain onset 2 days ago worsening with  movement. Shortness of breath. EXAM: CT CHEST, ABDOMEN AND PELVIS WITHOUT CONTRAST TECHNIQUE: Multidetector CT imaging of the chest, abdomen and pelvis was performed following the standard protocol without IV contrast. RADIATION DOSE REDUCTION: This exam was performed according to the departmental dose-optimization program which includes automated exposure control, adjustment of the mA and/or kV according to patient size and/or use of iterative reconstruction technique. COMPARISON:  PET-CT skull base to pelvis 09/09/2021, CT abdomen pelvis without contrast 03/31/2021. Most recent chest x-ray was portable chest today, portable chest 08/13/2022. FINDINGS: CT CHEST FINDINGS Cardiovascular: There is mild cardiomegaly. Metal artifact from left chest dual lead pacing system and dual lead wires in the heart. There are CABG changes. Aortic tortuosity and moderate patchy arthrosclerosis with aortic root ectasia up to 3.8 cm and ascending aorta with mild dilatation to 4.2 cm. The remainder is within normal caliber limits with scattered calcific plaque in the great vessels. There is a prominent pulmonary trunk 3.4 cm indicating arterial hypertension, unchanged. There are normal caliber pulmonary veins. Mediastinum/Nodes: No intrathoracic or axillary adenopathy. Thyroid gland obscured by metallic artifact from bilateral shoulder replacements and left chest pacemaker. Small amount of retained secretions at the right posterolateral tracheal wall, otherwise unremarkable trachea and main bronchi. Lungs/Pleura: Chronic changes in the bases and moderate emphysematous disease with both paraseptal and centrilobular changes. Right lower lobectomy with volume loss and mild asymmetric elevation of the right diaphragm. There is mild bronchial thickening. No pneumonic infiltrate or nodule is seen. No pleural effusion, thickening or pneumothorax. Musculoskeletal: There is osteopenia, degenerative disc disease and spondylosis of the thoracic  spine. As above there are bilateral shoulder replacements. CT ABDOMEN PELVIS FINDINGS Hepatobiliary: The liver is unremarkable without contrast. The gallbladder and bile ducts are unremarkable. Pancreas: No focal abnormality. Spleen: Chronic subcentimeter low-attenuation lesion in the central spleen. Probable cyst or hemangioma. Stable. Otherwise unremarkable without contrast. Adrenals/Urinary Tract: There is no adrenal mass. There is bilateral renal cortical thinning. Stable left renal cysts and 1 cm nonobstructive caliceal stone in the superior pole. 2 mm stone or renovascular calcification upper pole right kidney. Perinephric stranding is also similar. There is no ureteral stone or hydronephrosis. The right side of the bladder obscured by interval new right hip replacement. The visualized bladder normal in thickness. Stomach/Bowel: Small hiatal hernia. Contracted stomach with normal caliber unopacified small bowel. Normal appendix which is well visible. Mild-to-moderate stool retention in the colon including the rectum but no rectal wall thickening or inflammation. Scattered sigmoid diverticula without diverticulitis. Vascular/Lymphatic: Aortoiliac heavy  calcific plaques are again noted with aorto bi-iliac stent graft. A large excluded aneurysm sac is again noted and is larger than previously. On the 2 prior studies, the aneurysmal sac measured 6.9 x 7.0 cm, today measuring 7.6 x 7.6 cm. 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 There are endovascular coils in the region of the right internal iliac artery which were noted previously. Reproductive: Old prostatectomy. Other: There are tiny umbilical and inguinal fat hernias. There is no incarcerated hernia. There is no free hemorrhage, free fluid or free air, or inflammatory stranding around the AAA. Multiple pelvic phleboliths. Musculoskeletal: Chronic L5-S1 fusion hardware is again noted with chronic L5  spondylolysis and grade 2 L5-S1 spondylolisthesis, unchanged. There is osteopenia degenerative change of the lumbar spine with discogenic mild grade 1 retrolisthesis unchanged at L1-2 and L2-3, slight dextroscoliosis. Interval right hip replacement. No acute hardware complications. No operative site hematoma. Ankylosis left SI joint. IMPRESSION: 1. COPD and mild chronic bronchitis with prior right lower lobectomy. No focal pneumonia is seen. Chronic prominence of the pulmonary trunk. 2. Aortic and coronary artery atherosclerosis, prior CABG, pacemaker, and abdominal aortobi-iliac stent grafting. 3. 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 Annual CTA or MRA follow-up recommended improved 4. 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 CTA would be the preferred imaging workup, MRA if the patient cannot have IV contrast. 5. Constipation and diverticulosis. 6. Nonobstructive nephrolithiasis. 7. Degenerative and postsurgical changes of the spine and osteopenia. No acute spinal compression fracture. Electronically Signed   By: Telford Nab M.D.   On: 08/14/2022 05:23   CT Head Wo Contrast  Result Date: 08/14/2022 CLINICAL DATA:  81 year old male with altered mental status. Increasing back pain with movement. EXAM: CT HEAD WITHOUT CONTRAST TECHNIQUE: Contiguous axial images were obtained from the base of the skull through the vertex without intravenous contrast. RADIATION DOSE REDUCTION: This exam was performed according to the departmental dose-optimization program which includes automated exposure control, adjustment of the mA and/or kV according to patient size and/or use of iterative reconstruction technique. COMPARISON:  Brain MRI 01/14/2010.  Head CT 06/10/2022. 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 Small chronic bilateral cerebellar infarcts. Stable cerebral volume.  Patchy encephalomalacia and dystrophic calcification in the right parietal lobe is stable. Small area of chronic encephalomalacia in the right inferior frontal gyrus is stable. No midline shift, ventriculomegaly, mass effect, evidence of mass lesion, intracranial hemorrhage or evidence of cortically based acute infarction. Vascular: Calcified atherosclerosis at the skull base. No suspicious intracranial vascular hyperdensity. Skull: No acute osseous abnormality identified. Sinuses/Orbits: Mild sinus opacification in the left frontal, frontoethmoidal recess and sphenoid sinus have not significantly changed from last month. And overall paranasal sinuses and mastoids are well aerated. Other: No acute orbit or scalp soft tissue finding. IMPRESSION: 1. No acute intracranial abnormality identified. 2. Advanced chronic ischemic disease appears stable by CT since last month. Electronically Signed   By: Genevie Ann M.D.   On: 08/14/2022 04:54   DG Chest Portable 1 View  Result Date: 08/14/2022 CLINICAL DATA:  Shortness of breath, low back pain EXAM: PORTABLE CHEST 1 VIEW COMPARISON:  08/13/2022 FINDINGS: Mild patchy right lower lobe opacity, new, suspicious for pneumonia. Trace right pleural effusion versus chronic pleural thickening. Left lung is clear. No pneumothorax. Mild cardiomegaly. Postsurgical changes related to prior  CABG. Left subclavian pacemaker. Median sternotomy. IMPRESSION: Mild patchy right lower lobe opacity, new, suspicious for pneumonia. Electronically Signed   By: Julian Hy M.D.   On: 08/14/2022 03:17            LOS: 3 days       Emeterio Reeve, DO [facility] 08/17/2022, 6:56 PM   Staff may message me via secure chat in [place]  but this may not receive immediate response,  please page for urgent matters!  If 7PM-7AM, please contact night-coverage [email or web address]  Dictation software was used to generate the above note. Typos may occur and escape review, as with  typed/written notes. Please contact Dr Sheppard Coil directly for clarity if needed.

## 2022-08-17 NOTE — Progress Notes (Signed)
[place], [place] 08/17/22  Subjective:   Hospital day # 3  Patient known to our practice from outpatient follow-up of CKD, with Dr Candiss Norse.  Patient seen resting comfortably, wife at bedside Wife states patient pain uncontrolled with current medications. Patient states pain worse at nighttime Remains on room air with no lower extremity edema     10/09 0701 - 10/10 0700 In: 1709.1 [I.V.:1409.1; IV Piggyback:300] Out: 750 [Urine:750] Lab Results  Component Value Date   CREATININE 2.06 (H) 08/17/2022   CREATININE 2.10 (H) 08/16/2022   CREATININE 2.33 (H) 08/15/2022     Objective:  Vital signs in last 24 hours:  Temp:  [98.1 F (36.7 C)-99 F (37.2 C)] 98.3 F (36.8 C) (10/10 1152) Pulse Rate:  [55-88] 76 (10/10 1152) Resp:  [12-28] 18 (10/10 1152) BP: (131-176)/(69-93) 131/72 (10/10 1152) SpO2:  [92 %-100 %] 100 % (10/10 1152)  Weight change:  Filed Weights   08/14/22 0232 08/15/22 0500 08/16/22 0226  Weight: 99.8 kg 85.4 kg 86.1 kg    Intake/Output:    Intake/Output Summary (Last 24 hours) at 08/17/2022 1344 Last data filed at 08/17/2022 0700 Gross per 24 hour  Intake 1709.11 ml  Output 350 ml  Net 1359.11 ml      Physical Exam: General: elderly gentleman, laying in the bed  HEENT Anicteric, moist oral mucous membranes  Pulm/lungs Normal breathing effort, clear to auscultation  CVS/Heart No rub or gallop  Abdomen:  Soft, nontender  Extremities: No peripheral edema  Neurologic: Alert, oriented, pain with movement of right leg  Skin: No acute rashes          Basic Metabolic Panel:  Recent Labs  Lab 08/14/22 0247 08/15/22 0440 08/16/22 0403 08/17/22 0530  NA 137 137 138 138  K 4.6 4.3 4.5 3.9  CL 103 108 109 109  CO2 24 24 22 22   GLUCOSE 104* 104* 106* 88  BUN 31* 32* 30* 28*  CREATININE 2.69* 2.33* 2.10* 2.06*  CALCIUM 9.1 8.7* 8.6* 8.4*      CBC: Recent Labs  Lab 08/14/22 0247 08/15/22 0440  08/16/22 0403 08/17/22 0530  WBC 3.8* 4.4 5.4 5.5  HGB 8.6* 7.8* 7.9* 8.0*  HCT 28.6* 25.5* 25.4* 26.1*  MCV 92.9 90.7 91.0 91.3  PLT 136* 149* 155 136*      No results found for: "HEPBSAG", "HEPBSAB", "HEPBIGM"    Microbiology:  Recent Results (from the past 240 hour(s))  Culture, blood (routine x 2)     Status: Abnormal   Collection Time: 08/14/22  3:57 AM   Specimen: BLOOD  Result Value Ref Range Status   Specimen Description   Final    BLOOD BLOOD RIGHT ARM Performed at [facility], [address] [place], [place] [address]    Special Requests   Final    BOTTLES DRAWN AEROBIC AND ANAEROBIC Blood Culture adequate volume Performed at [facility], [address] [place], [place] [address]    Culture  Setup Time   Final    Organism ID to follow [place] TO, READ BACK BY AND VERIFIED WITH: PHARMD CHILDS AT 1957 08/14/2022 GAA Performed at [facility], [address] [place], [place] [address]    Culture ENTEROCOCCUS FAECALIS (A)  Final   Report Status 08/17/2022 FINAL  Final   Organism ID, Bacteria ENTEROCOCCUS FAECALIS  Final      Susceptibility   Enterococcus faecalis -  MIC*    AMPICILLIN <=2 SENSITIVE Sensitive     VANCOMYCIN 1 SENSITIVE Sensitive     GENTAMICIN SYNERGY SENSITIVE Sensitive     * ENTEROCOCCUS FAECALIS  Culture, blood (routine x 2)     Status: Abnormal   Collection Time: 08/14/22  3:57 AM   Specimen: BLOOD  Result Value Ref Range Status   Specimen Description   Final    BLOOD BLOOD LEFT ARM Performed at [facility], [address] [place], [place] [address]    Special Requests   Final    BOTTLES DRAWN AEROBIC AND ANAEROBIC Blood Culture adequate volume Performed at [facility], [address] [place], [place] [address]    Culture  Setup Time   Final    GRAM POSITIVE COCCI AEROBIC BOTTLE ONLY CRITICAL RESULT  CALLED TO, READ BACK BY AND VERIFIED WITH: PHARMD CHILDS AT 1957 08/14/2022 GAA GRAM STAIN REVIEWED-AGREE WITH RESULT Performed at [facility], [address] [place], [place] [address]    Culture (A)  Final    ENTEROCOCCUS FAECALIS SUSCEPTIBILITIES PERFORMED ON PREVIOUS CULTURE WITHIN THE LAST 5 DAYS. Performed at [facility], [address] [place], [place] [address]    Report Status 08/17/2022 FINAL  Final  Resp Panel by RT-PCR (Flu A&B, Covid) Anterior Nasal Swab     Status: None   Collection Time: 08/14/22  3:57 AM   Specimen: Anterior Nasal Swab  Result Value Ref Range Status   SARS Coronavirus 2 by RT PCR NEGATIVE NEGATIVE Final    Comment: (NOTE) SARS-CoV-2 target nucleic acids are NOT DETECTED.  The SARS-CoV-2 RNA is generally detectable in upper respiratory specimens during the acute phase of infection. The lowest concentration of SARS-CoV-2 viral copies this assay can detect is 138 copies/mL. A negative result does not preclude SARS-Cov-2 infection and should not be used as the sole basis for treatment or other patient management decisions. A negative result may occur with  improper specimen collection/handling, submission of specimen other than nasopharyngeal swab, presence of viral mutation(s) within the areas targeted by this assay, and inadequate number of viral copies(<138 copies/mL). A negative result must be combined with clinical observations, patient history, and epidemiological information. The expected result is Negative.  Fact Sheet for Patients:  EntrepreneurPulse.com.au  Fact Sheet for Healthcare Providers:  IncredibleEmployment.be  This test is no t yet approved or cleared by the [place] FDA and  has been authorized for detection and/or diagnosis of SARS-CoV-2 by FDA under an Emergency Use Authorization (EUA). This EUA will remain  in effect (meaning this test can be used) for the duration of  the COVID-19 declaration under Section 564(b)(1) of the Act, 21 U.S.C.section 360bbb-3(b)(1), unless the authorization is terminated  or revoked sooner.       Influenza A by PCR NEGATIVE NEGATIVE Final   Influenza B by PCR NEGATIVE NEGATIVE Final    Comment: (NOTE) The Xpert Xpress SARS-CoV-2/FLU/RSV plus assay is intended as an aid in the diagnosis of influenza from Nasopharyngeal swab specimens and should not be used as a sole basis for treatment. Nasal washings and aspirates are unacceptable for Xpert Xpress SARS-CoV-2/FLU/RSV testing.  Fact Sheet for Patients: EntrepreneurPulse.com.au  Fact Sheet for Healthcare Providers: IncredibleEmployment.be  This test is not yet approved or cleared by the [place] FDA and has been authorized for detection and/or diagnosis of SARS-CoV-2 by FDA under an Emergency Use Authorization (EUA). This EUA will remain in effect (meaning this test can be used) for  the duration of the COVID-19 declaration under Section 564(b)(1) of the Act, 21 U.S.C. section 360bbb-3(b)(1), unless the authorization is terminated or revoked.  Performed at [facility], [address] [place], [place] [address]   Blood Culture ID Panel (Reflexed)     Status: Abnormal   Collection Time: 08/14/22  3:57 AM  Result Value Ref Range Status   Enterococcus faecalis DETECTED (A) NOT DETECTED Final    Comment: CRITICAL RESULT CALLED TO, READ BACK BY AND VERIFIED WITH: PHARMD CHILDS AT 1957 08/14/2022 GAA    Enterococcus Faecium NOT DETECTED NOT DETECTED Final   Listeria monocytogenes NOT DETECTED NOT DETECTED Final   Staphylococcus species NOT DETECTED NOT DETECTED Final   Staphylococcus aureus (BCID) NOT DETECTED NOT DETECTED Final   Staphylococcus epidermidis NOT DETECTED NOT DETECTED Final   Staphylococcus lugdunensis NOT DETECTED NOT DETECTED Final   Streptococcus species NOT DETECTED NOT DETECTED Final    Streptococcus agalactiae NOT DETECTED NOT DETECTED Final   Streptococcus pneumoniae NOT DETECTED NOT DETECTED Final   Streptococcus pyogenes NOT DETECTED NOT DETECTED Final   A.calcoaceticus-baumannii NOT DETECTED NOT DETECTED Final   Bacteroides fragilis NOT DETECTED NOT DETECTED Final   Enterobacterales NOT DETECTED NOT DETECTED Final   Enterobacter cloacae complex NOT DETECTED NOT DETECTED Final   Escherichia coli NOT DETECTED NOT DETECTED Final   Klebsiella aerogenes NOT DETECTED NOT DETECTED Final   Klebsiella oxytoca NOT DETECTED NOT DETECTED Final   Klebsiella pneumoniae NOT DETECTED NOT DETECTED Final   Proteus species NOT DETECTED NOT DETECTED Final   Salmonella species NOT DETECTED NOT DETECTED Final   Serratia marcescens NOT DETECTED NOT DETECTED Final   Haemophilus influenzae NOT DETECTED NOT DETECTED Final   Neisseria meningitidis NOT DETECTED NOT DETECTED Final   Pseudomonas aeruginosa NOT DETECTED NOT DETECTED Final   Stenotrophomonas maltophilia NOT DETECTED NOT DETECTED Final   Candida albicans NOT DETECTED NOT DETECTED Final   Candida auris NOT DETECTED NOT DETECTED Final   Candida glabrata NOT DETECTED NOT DETECTED Final   Candida krusei NOT DETECTED NOT DETECTED Final   Candida parapsilosis NOT DETECTED NOT DETECTED Final   Candida tropicalis NOT DETECTED NOT DETECTED Final   Cryptococcus neoformans/gattii NOT DETECTED NOT DETECTED Final   Vancomycin resistance NOT DETECTED NOT DETECTED Final    Comment: Performed at [facility], [address] [place], [place] [address]  Urine Culture     Status: Abnormal   Collection Time: 08/14/22  6:54 AM   Specimen: Urine, Clean Catch  Result Value Ref Range Status   Specimen Description   Final    URINE, CLEAN CATCH Performed at [facility], [address], [place], [place] [address]    Special Requests   Final    NONE Performed at [facility], [address] [place], [place]  [address]    Culture >=100,000 COLONIES/mL ENTEROCOCCUS FAECALIS (A)  Final   Report Status 08/16/2022 FINAL  Final   Organism ID, Bacteria ENTEROCOCCUS FAECALIS (A)  Final      Susceptibility   Enterococcus faecalis - MIC*    AMPICILLIN <=2 SENSITIVE Sensitive     NITROFURANTOIN <=16 SENSITIVE Sensitive     VANCOMYCIN 1 SENSITIVE Sensitive     * >=100,000 COLONIES/mL ENTEROCOCCUS FAECALIS  Culture, blood (Routine X 2) w Reflex to ID Panel     Status: None (Preliminary result)   Collection Time: 08/15/22 12:22 PM   Specimen: Right Antecubital; Blood  Result Value Ref Range Status   Specimen  Description RIGHT ANTECUBITAL  Final   Special Requests   Final    BOTTLES DRAWN AEROBIC AND ANAEROBIC Blood Culture adequate volume   Culture   Final    NO GROWTH 2 DAYS Performed at [facility], [address] [place], [place] [address]    Report Status PENDING  Incomplete  Culture, blood (Routine X 2) w Reflex to ID Panel     Status: None (Preliminary result)   Collection Time: 08/15/22 12:22 PM   Specimen: BLOOD RIGHT ARM  Result Value Ref Range Status   Specimen Description BLOOD RIGHT ARM  Final   Special Requests   Final    BOTTLES DRAWN AEROBIC AND ANAEROBIC Blood Culture adequate volume   Culture   Final    NO GROWTH 2 DAYS Performed at [facility], [address] [place], [place] [address]    Report Status PENDING  Incomplete    Coagulation Studies: Recent Labs    08/14/22 1958 08/17/22 1314  LABPROT 14.5 14.9  INR 1.1 1.2     Urinalysis: No results for input(s): "COLORURINE", "LABSPEC", "PHURINE", "GLUCOSEU", "HGBUR", "BILIRUBINUR", "KETONESUR", "PROTEINUR", "UROBILINOGEN", "NITRITE", "LEUKOCYTESUR" in the last 72 hours.  Invalid input(s): "APPERANCEUR"     Imaging: PERIPHERAL VASCULAR CATHETERIZATION  Result Date: 08/16/2022 See surgical note for result.    Medications:    sodium chloride 100 mL/hr at 08/17/22 1304   ampicillin  (OMNIPEN) IV 2 g (08/17/22 0509)   cefTRIAXone (ROCEPHIN)  IV 2 g (08/17/22 0913)   heparin      atorvastatin  40 mg Oral QHS   calcitRIOL  0.25 mcg Oral q AM   clopidogrel  75 mg Oral Daily   donepezil  10 mg Oral QHS   ezetimibe  10 mg Oral QPM   ferrous sulfate  325 mg Oral Q2000   heparin  5,000 Units Intravenous Once   ipratropium  2-4 spray Each Nare QHS   lidocaine  1 patch Transdermal Q24H   loratadine  10 mg Oral Daily   melatonin  10 mg Oral QHS   methocarbamol  500 mg Oral Q2000   metoprolol succinate  25 mg Oral Daily   multivitamin with minerals  1 tablet Oral QPC lunch   oxyCODONE-acetaminophen  2 tablet Oral Q6H   pantoprazole  40 mg Oral QAC breakfast   polyethylene glycol  17 g Oral BID   predniSONE  40 mg Oral Q breakfast   senna-docusate  2 tablet Oral BID   zolpidem  10 mg Oral QHS   albuterol, ALPRAZolam, dextromethorphan-guaiFENesin, hydrALAZINE, HYDROmorphone (DILAUDID) injection, methocarbamol, ondansetron (ZOFRAN) IV  Assessment/ Plan:  81 y.o. 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  Urinalysis shows small proteinuria.  Concern about IV contrast exposure causing worsening of kidney function.  Overall this is a difficult situation.  Patient does require proper assessment of aorta (which necessitates IV contrast exposure) prior to planning endovascular repair. 2D  echo from 06/14/2022 shows LVEF 60 to 96%, grade 1 diastolic dysfunction  Plan: Function remains stable  and will continue IV fluids in preparation of procedures requiring contrast.  Will defer pain management to primary team.  Primary team requesting CT spine and chest with contrast to evaluate back pain and PE, this has been cleared with renal staff due to decreased renal function.  TEE planned tomorrow to evaluate possible source of bacteremia.    LOS: [place] 10/10/20231:44 PM  Deputy, [place]  Note: This note was prepared with Dragon dictation. Any transcription errors are unintentional

## 2022-08-17 NOTE — Plan of Care (Signed)
  Problem: Education: Goal: Knowledge of disease or condition will improve Outcome: Progressing Goal: Knowledge of the prescribed therapeutic regimen will improve Outcome: Progressing Goal: Individualized Educational Video(s) Outcome: Progressing   Problem: Activity: Goal: Ability to tolerate increased activity will improve Outcome: Progressing Goal: Will verbalize the importance of balancing activity with adequate rest periods Outcome: Progressing   Problem: Respiratory: Goal: Ability to maintain a clear airway will improve Outcome: Progressing Goal: Levels of oxygenation will improve Outcome: Progressing Goal: Ability to maintain adequate ventilation will improve Outcome: Progressing   Problem: Activity: Goal: Ability to tolerate increased activity will improve Outcome: Progressing   Problem: Clinical Measurements: Goal: Ability to maintain a body temperature in the normal range will improve Outcome: Progressing   Problem: Respiratory: Goal: Ability to maintain adequate ventilation will improve Outcome: Progressing Goal: Ability to maintain a clear airway will improve Outcome: Progressing   Problem: Education: Goal: Knowledge of General Education information will improve Description: Including pain rating scale, medication(s)/side effects and non-pharmacologic comfort measures Outcome: Progressing   Problem: Health Behavior/Discharge Planning: Goal: Ability to manage health-related needs will improve Outcome: Progressing   Problem: Clinical Measurements: Goal: Ability to maintain clinical measurements within normal limits will improve Outcome: Progressing Goal: Will remain free from infection Outcome: Progressing Goal: Diagnostic test results will improve Outcome: Progressing Goal: Respiratory complications will improve Outcome: Progressing Goal: Cardiovascular complication will be avoided Outcome: Progressing   Problem: Activity: Goal: Risk for activity  intolerance will decrease Outcome: Progressing   Problem: Nutrition: Goal: Adequate nutrition will be maintained Outcome: Progressing   Problem: Coping: Goal: Level of anxiety will decrease Outcome: Progressing   Problem: Elimination: Goal: Will not experience complications related to bowel motility Outcome: Progressing Goal: Will not experience complications related to urinary retention Outcome: Progressing   Problem: Pain Managment: Goal: General experience of comfort will improve Outcome: Progressing   Problem: Safety: Goal: Ability to remain free from injury will improve Outcome: Progressing   Problem: Skin Integrity: Goal: Risk for impaired skin integrity will decrease Outcome: Progressing   Problem: Education: Goal: Knowledge of General Education information will improve Description: Including pain rating scale, medication(s)/side effects and non-pharmacologic comfort measures Outcome: Progressing   Problem: Health Behavior/Discharge Planning: Goal: Ability to manage health-related needs will improve Outcome: Progressing   Problem: Clinical Measurements: Goal: Ability to maintain clinical measurements within normal limits will improve Outcome: Progressing Goal: Will remain free from infection Outcome: Progressing Goal: Diagnostic test results will improve Outcome: Progressing Goal: Respiratory complications will improve Outcome: Progressing Goal: Cardiovascular complication will be avoided Outcome: Progressing   Problem: Activity: Goal: Risk for activity intolerance will decrease Outcome: Progressing   Problem: Nutrition: Goal: Adequate nutrition will be maintained Outcome: Progressing   Problem: Coping: Goal: Level of anxiety will decrease Outcome: Progressing   Problem: Elimination: Goal: Will not experience complications related to bowel motility Outcome: Progressing Goal: Will not experience complications related to urinary  retention Outcome: Progressing   Problem: Pain Managment: Goal: General experience of comfort will improve Outcome: Progressing   Problem: Safety: Goal: Ability to remain free from injury will improve Outcome: Progressing   Problem: Skin Integrity: Goal: Risk for impaired skin integrity will decrease Outcome: Progressing

## 2022-08-17 NOTE — Progress Notes (Signed)
ID Pt still in pain But better than yesterday Had a rough night Pain meds adjusted this morning  O/e Awake and alert Trying to eat dinner lyin in bed Wife at bed side Patient Vitals for the past 24 hrs:  BP Temp Temp src Pulse Resp SpO2  08/17/22 1932 (!) 150/62 98.7 F (37.1 C) Oral 79 19 98 %  08/17/22 1613 138/76 98 F (36.7 C) -- 71 16 99 %  08/17/22 1152 131/72 98.3 F (36.8 C) -- 76 18 100 %  08/17/22 0815 (!) 159/70 98.3 F (36.8 C) Oral 70 16 94 %  08/17/22 0435 (!) 147/84 98.1 F (36.7 C) -- 88 19 99 %  08/17/22 0008 (!) 152/82 99 F (37.2 C) -- 76 18 97 %   Chest b/l air entry [id number] Pacemaker Abd soft CNS did not examine  Labs    Latest Ref Rng & Units 08/17/2022    5:30 AM 08/16/2022    4:03 AM 08/15/2022    4:40 AM  CBC  WBC 4.0 - 10.5 K/uL 5.5  5.4  4.4   Hemoglobin 13.0 - 17.0 g/dL 8.0  7.9  7.8   Hematocrit 39.0 - 52.0 % 26.1  25.4  25.5   Platelets 150 - 400 K/uL 136  155  149        Latest Ref Rng & Units 08/17/2022    5:30 AM 08/16/2022    4:03 AM 08/15/2022    4:40 AM  CMP  Glucose 70 - 99 mg/dL 88  106  104   BUN 8 - 23 mg/dL 28  30  32   Creatinine 0.61 - 1.24 mg/dL 2.06  2.10  2.33   Sodium 135 - 145 mmol/L 138  138  137   Potassium 3.5 - 5.1 mmol/L 3.9  4.5  4.3   Chloride 98 - 111 mmol/L 109  109  108   CO2 22 - 32 mmol/L 22  22  24    Calcium 8.9 - 10.3 mg/dL 8.4  8.6  8.7   Total Protein 6.5 - 8.1 g/dL 5.2     Total Bilirubin 0.3 - 1.2 mg/dL 0.7     Alkaline Phos 38 - 126 U/L 52     AST 15 - 41 U/L 16     ALT 0 - 44 U/L 15       Micro BC- 08/14/22 Enterococcis UC - enterococcus 08/15/22 BC   Impression/recommendation  Low back pain- has previous L5-s1 fusion Could this be due to Degen disc disease Or due to the enlarging abdominal aneurysm or infection ( discitis/osteo) PT needs MRI but he has pacemaker , so he will need to go to [facility] for MRI    Enterococcus bacteremia-  Concern for endocarditis due to pacemaker and also  discitis/osteo Source could be the urinary trract as urine culture positive for enterococcus , but he has no symptoms of UTI. HE has a h/o radical prostatectomy Will do a few post void bladder scans to look for residue   Will need TEE On ceftriaxone / ampicillin Will repeat blood culture   AAA- s/p endovascular stent- worsening aneurysm with endo leak 2. 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

## 2022-08-17 NOTE — Progress Notes (Addendum)
[facility] Patient ID: Rick Mcbride. MRN: [id number] DOB/AGE: 1941/05/14 81 y.o.   Admit date: 08/14/2022 Referring Physician Blaine Hamper Primary Physician [facility] Primary Cardiologist Nehemiah Massed Reason for Consultation preoperative cardiovascular evaluation   HPI: 81 year old gentleman referred for preoperative cardiovascular evaluation prior to abdominal aortic aneurysm repair.  Patient brought to [facility] ED via EMS with chief complaint of lower back pain and generalized weakness.  ECG revealed atrial sensing with ventricular pacing.  Admission labs notable for borderline elevated high-sensitivity troponin (31, 30) in the absence of chest pain, likely elevated due to underlying chronic kidney disease.  Abdominal CT was performed which revealed abdominal aortic aneurysm 7.6 x 7.6 cm which is increased compared to most recent study showing 6.9 x 7.0 cm.  The patient evaluated by vascular surgery who is recommending surgical repair.   The patient has known coronary disease, status post CABG with LIMA to LAD, SVG to OM1 and PDA performed 2005 at [facility].  The patient has dual-chamber pacemaker for second-degree AV block.  Patient has known chronic kidney disease.  He has prior history of CVA/TIA.  Most recent 2D echocardiogram 06/14/2022 revealed LVEF 60-65 %.  Lexiscan Myoview 10/24/2019 did not reveal evidence for scar or ischemia.  The patient currently denies chest pain.  He does report intermittent shortness of breath which is felt to be due to possible bronchitis.  Chest x-ray reveals possible right lower lobe pneumonia.  Interval history: -S/p coil embolization of vessels creating type II endoleak with vascular surgery yesterday evening 10/9 -Continues to have intermittent spasms of severe back pain -Denies chest pain or shortness of breath -Discussed at length with the patient and his wife at bedside today potential plan after TEE, re: long-term antibiotics, pacemaker wire extraction, etc.  They are  agreeable to undergo TEE to see if there is a vegetation.  Conversations seem to favor conservative management if there is a vegetation found and focus on pain control and comfort.   Vitals:   08/17/22 0008 08/17/22 0435 08/17/22 0815 08/17/22 1152  BP: (!) 152/82 (!) 147/84 (!) 159/70 131/72  Pulse: 76 88 70 76  Resp: 18 19 16 18   Temp: 99 F (37.2 C) 98.1 F (36.7 C) 98.3 F (36.8 C) 98.3 F (36.8 C)  TempSrc:   Oral   SpO2: 97% 99% 94% 100%  Weight:      Height:         Intake/Output Summary (Last 24 hours) at 08/17/2022 1153 Last data filed at 08/17/2022 0700 Gross per 24 hour  Intake 1709.11 ml  Output 750 ml  Net 959.11 ml       PHYSICAL EXAM  General: Pleasant elderly Caucasian male, well nourished, in no acute distress.  Laying flat in hospital bed with wife at bedside HEENT:  Normocephalic and atraumatic Neck:  No JVD.  Lungs: Normal respiratory effort on room air.  Clear bilaterally to auscultation Heart: Regular rate and rhythm with occasional paced beats. Normal S1 and S2 without gallops or murmurs.  Abdomen: Nondistended appearing  Msk:   Normal strength and tone for age. Extremities: No clubbing, cyanosis or edema.   Neuro: Alert and oriented X 3. Psych:  Good affect, responds appropriately   LABS: Basic Metabolic Panel: Recent Labs    08/16/22 0403 08/17/22 0530  NA 138 138  K 4.5 3.9  CL 109 109  CO2 22 22  GLUCOSE 106* 88  BUN 30* 28*  CREATININE 2.10* 2.06*  CALCIUM 8.6* 8.4*    Liver Function  Tests: Recent Labs    08/17/22 0530  AST 16  ALT 15  ALKPHOS 52  BILITOT 0.7  PROT 5.2*  ALBUMIN 2.7*   No results for input(s): "LIPASE", "AMYLASE" in the last 72 hours. CBC: Recent Labs    08/16/22 0403 08/17/22 0530  WBC 5.4 5.5  HGB 7.9* 8.0*  HCT 25.4* 26.1*  MCV 91.0 91.3  PLT 155 136*    Cardiac Enzymes: No results for input(s): "CKTOTAL", "CKMB", "CKMBINDEX", "TROPONINI" in the last 72 hours. BNP: Invalid input(s):  "POCBNP" D-Dimer: No results for input(s): "DDIMER" in the last 72 hours. Hemoglobin A1C: No results for input(s): "HGBA1C" in the last 72 hours. Fasting Lipid Panel: No results for input(s): "CHOL", "HDL", "LDLCALC", "TRIG", "CHOLHDL", "LDLDIRECT" in the last 72 hours. Thyroid Function Tests: No results for input(s): "TSH", "T4TOTAL", "T3FREE", "THYROIDAB" in the last 72 hours.  Invalid input(s): "FREET3" Anemia Panel: No results for input(s): "VITAMINB12", "FOLATE", "FERRITIN", "TIBC", "IRON", "RETICCTPCT" in the last 72 hours.  PERIPHERAL VASCULAR CATHETERIZATION  Result Date: 08/16/2022 See surgical note for result.     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  Preoperative cardiovascular assessment.  Based on patients age and comorbidities, the patient poses at least mild to moderate risk for cardiovascular complication during surgical repair of abdominal aortic aneurysm.  The patient appears to be medically optimized.  Patient denies chest pain.  Dual  chamber pacemaker appears to be functioning appropriately.  High-sensitivity troponin borderline elevated in the absence of chest pain, most likely elevated due to underlying chronic kidney disease. 2.  Known CAD, CABG x3, currently without chest pain 3.  Abdominal aortic aneurysm, s/p coil embolization with vascular surgery on 10/9 4.  Status post dual-chamber pacemaker for second-degree AV block, with CTA abdomen pelvis showing unusual lead placement extending through an interarterial defect and terminating in the left ventricle, of uncertain clinical significance surface echo pending  5.  Chronic kidney disease, stage IIIb 6.  Severe back pain, uncertain etiology, hospitalist planning CT spine with contrast 7.  E. Faecalis bacteremia, 2D echocardiogram pending, ID recommends TEE to rule out pacer lead vegetation/infection 8. ? Pulmonary embolism, recent low risk VQ scan, on CTA abdomen pelvis noted small PE, pending CTA chest for definitive evaluation.   Recommendations   1.  Agree with current therapy 2.  Transthoracic echocardiogram pending read. 3.  Discussed risk, benefit, potential outcomes of TEE with the patient and his wife.  He is agreeable to proceed to determine if there is a vegetation  on the pacemaker wire or any heart valve as a source of his bacteremia.  Addendum: Plan for TEE on Thursday, 10/12 due to staff availability, space logistics with special recoveries.  N.p.o. at midnight Wednesday 10/11.   4.  Ongoing discussions regarding goals of care versus symptom management.  This patient's plan of care was discussed and created with Dr. Saralyn Pilar and he is in agreement.    Tristan Schroeder, PA-C 08/17/2022 11:53 AM

## 2022-08-17 NOTE — Consult Note (Signed)
ANTICOAGULATION CONSULT NOTE - Initial Consult  Pharmacy Consult for Heparin infusion Indication: pulmonary embolus  No Known Allergies  Patient Measurements: Height: 5\' 11"  (180.3 cm) Weight: 86.1 kg (189 lb 13.1 oz) IBW/kg (Calculated) : 75.3 Heparin Dosing Weight: 86.1 kg  Vital Signs: Temp: 98.3 F (36.8 C) (10/10 1152) Temp Source: Oral (10/10 0815) BP: 131/72 (10/10 1152) Pulse Rate: 76 (10/10 1152)  Labs: Recent Labs    08/14/22 1958 08/15/22 0440 08/15/22 0440 08/16/22 0403 08/17/22 0530  HGB  --  7.8*   < > 7.9* 8.0*  HCT  --  25.5*  --  25.4* 26.1*  PLT  --  149*  --  155 136*  APTT 33  --   --   --   --   LABPROT 14.5  --   --   --   --   INR 1.1  --   --   --   --   CREATININE  --  2.33*  --  2.10* 2.06*   < > = values in this interval not displayed.    Estimated Creatinine Clearance: 30 mL/min (A) (by C-G formula based on SCr of 2.06 mg/dL (H)).   Medical History: Past Medical History:  Diagnosis Date   AAA (abdominal aortic aneurysm) ([place])    a.) s/p EVAR 01/05/2017. b.) native aneurysm sac 6.6 x 6.9 cm by CT on 03/31/2021   Abnormality of tongue    a.) CT head/neck 07/10/2021 --> asymmetric soft tissue at the RIGHT tongue base with a superficial 8 mm lesion.   Anemia    Aneurysm of right common iliac artery (HCC)    a.) measured 2.7 cm by CT on 03/31/2021   Anxiety    Aortic atherosclerosis (HCC)    Atrophic kidney    B12 deficiency    CAD (coronary artery disease)    Cervical radiculopathy    Chronic airway obstruction (HCC)    Chronic kidney disease (CKD), stage III (moderate) (HCC)    Chronic pain syndrome 06/16/2021   Chronic right shoulder pain 11/12/2019   Chronic tension headaches    Chronic, continuous use of opioids 06/16/2021   Coronary artery disease    DDD (degenerative disc disease), lumbar    Degenerative disc disease, lumbar    with lumbar radiculopathy   Elbow fracture, left    GERD (gastroesophageal reflux disease)     H/O adenomatous polyp of colon    H/O hemorrhoids    HTN (hypertension)    Hyperlipidemia    Meralgia paresthetica    Mild cognitive impairment    Nephrolithiasis    Neuralgia    Numbness of right foot 02/19/2021   Osteoarthritis    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   Presence of permanent cardiac pacemaker    Prostate cancer [facility])    a.) s/p prostatectomy   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    Medications:  Scheduled:   atorvastatin  40  mg Oral QHS   calcitRIOL  0.25 mcg Oral q AM   donepezil  10 mg Oral QHS   ezetimibe  10 mg Oral QPM   ferrous sulfate  325 mg Oral Q2000   ipratropium  2-4 spray Each Nare QHS   lidocaine  1 patch Transdermal Q24H   loratadine  10 mg Oral Daily   melatonin  10 mg Oral QHS   methocarbamol  500 mg Oral Q2000   metoprolol succinate  25 mg Oral Daily   multivitamin with minerals  1 tablet Oral QPC lunch   oxyCODONE-acetaminophen  2 tablet Oral Q6H   pantoprazole  40 mg Oral QAC breakfast   polyethylene glycol  17 g Oral BID   predniSONE  40 mg Oral Q breakfast   senna-docusate  2 tablet Oral BID   zolpidem  10 mg Oral QHS   Infusions:   sodium chloride 75 mL/hr at 08/17/22 0912   ampicillin (OMNIPEN) IV 2 g (08/17/22 0509)   cefTRIAXone (ROCEPHIN)  IV 2 g (08/17/22 0913)    Assessment: Pharmacy consulted to manage heparin infusion for new PE. Noted vascula procedure performed 10/09. Holding clopidogrel since admission. Will check with vascular if okay to resume clopidogrel  today.  *Baseline INR and aPTT on 10/7 within normal limits. Will repeat INR and aPTT prior to initiate heparin infusion due to recent clinical changes and procedures*   Goal of Therapy:  Heparin level 0.3-0.7 units/ml Heparin level 66-102 units/ml Monitor platelets by anticoagulation protocol: Yes   Plan:  Give 5000 units bolus x 1 Start heparin infusion at 1400 units/hr Check anti-Xa level in 8 hours and daily while on heparin Continue to monitor H&H and platelets  Taeden Geller Rodriguez-Guzman PharmD, BCPS 08/17/2022 12:54 PM

## 2022-08-17 NOTE — Care Management Important Message (Signed)
Important Message  Patient Details  Name: Rick Mcbride. MRN: [id number] Date of Birth: [birth year]   Medicare Important Message Given:  Yes     Dannette Barbara 08/17/2022, 10:18 AM

## 2022-08-18 DIAGNOSIS — I2699 Other pulmonary embolism without acute cor pulmonale: Secondary | ICD-10-CM | POA: Diagnosis not present

## 2022-08-18 DIAGNOSIS — B952 Enterococcus as the cause of diseases classified elsewhere: Secondary | ICD-10-CM | POA: Diagnosis not present

## 2022-08-18 DIAGNOSIS — I714 Abdominal aortic aneurysm, without rupture, unspecified: Secondary | ICD-10-CM | POA: Diagnosis not present

## 2022-08-18 DIAGNOSIS — R7881 Bacteremia: Secondary | ICD-10-CM | POA: Diagnosis not present

## 2022-08-18 DIAGNOSIS — F419 Anxiety disorder, unspecified: Secondary | ICD-10-CM | POA: Diagnosis not present

## 2022-08-18 DIAGNOSIS — M47816 Spondylosis without myelopathy or radiculopathy, lumbar region: Secondary | ICD-10-CM

## 2022-08-18 DIAGNOSIS — T829XXA Unspecified complication of cardiac and vascular prosthetic device, implant and graft, initial encounter: Secondary | ICD-10-CM | POA: Diagnosis not present

## 2022-08-18 LAB — CBC
HCT: 30 % — ABNORMAL LOW (ref 39.0–52.0)
Hemoglobin: 9.4 g/dL — ABNORMAL LOW (ref 13.0–17.0)
MCH: 27.8 pg (ref 26.0–34.0)
MCHC: 31.3 g/dL (ref 30.0–36.0)
MCV: 88.8 fL (ref 80.0–100.0)
Platelets: 193 10*3/uL (ref 150–400)
RBC: 3.38 MIL/uL — ABNORMAL LOW (ref 4.22–5.81)
RDW: 15.5 % (ref 11.5–15.5)
WBC: 7.2 10*3/uL (ref 4.0–10.5)
nRBC: 0 % (ref 0.0–0.2)

## 2022-08-18 LAB — BASIC METABOLIC PANEL
Anion gap: 12 (ref 5–15)
BUN: 30 mg/dL — ABNORMAL HIGH (ref 8–23)
CO2: 20 mmol/L — ABNORMAL LOW (ref 22–32)
Calcium: 8.8 mg/dL — ABNORMAL LOW (ref 8.9–10.3)
Chloride: 109 mmol/L (ref 98–111)
Creatinine, Ser: 2.05 mg/dL — ABNORMAL HIGH (ref 0.61–1.24)
GFR, Estimated: 32 mL/min — ABNORMAL LOW (ref >60)
Glucose, Bld: 115 mg/dL — ABNORMAL HIGH (ref 70–99)
Potassium: 3.9 mmol/L (ref 3.5–5.1)
Sodium: 141 mmol/L (ref 135–145)

## 2022-08-18 NOTE — Progress Notes (Signed)
PROGRESS NOTE    Rick Mcbride.  MGQ:[id number] DOB: Apr 06, 1941 DOA: 08/14/2022 PCP: Idelle Crouch, MD    Brief Narrative:  Rick T Kellen Dutch. is a 81 y.o. 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 Patient had surgery and finished SNF rehab. Currently he is doing PT/OT at home), chronic lower back pain (L5-S1 fusion), who presents with lower back pain and shortness breath. Pt had negative PE by V/Q scan which is ordered by his PCP day PTA, which was done for persistent SOB postoperatively. 10/07: WBC 3.8, negative COVID PCR, lactic acid 0.9, troponin level 31, 30, slightly worsening renal function with creatinine 2.69, BUN 31, GFR 23 (recent baseline creatinine 2.39 06/15/2022), temperature normal, blood pressure 172/84, 129/64, RR 83, heart rate 83, RR 17, oxygen saturation 98% on room air.  Chest x-ray showed possible mild patchy infiltration in left lower lobe.  CT head negative.  CT scan showed enlargement of AAA from previous 6.9 to 7.6 cm now.  Patient is admitted to telemetry bed as inpatient. Dr. Lorenso Courier of [facility] was consulted re: AAA, needs CTA, nephrology consulted and recommended IV fluids prior to CTA of Abd/Pelvis. Cardiology also consulted for surgical optimization/risk stratification - medically optimized at this time, troponin mild elevation likely d/t CKD4. BCx (+) E Faecalis, ID recommended r/o endocarditis/pacer infection w/ TEE and vascular surgery to assess re: graft involvement, changed Abx to ampicillin and repeat Bcx, also recommend MRI L-spine or CT w/ contrast, pt has pacemaker   10/08:  Pt and wife agreeable to TEE and MRI, though MRI would have to be at [facility]. Per vascular: "Noted bacteremia- however, patient with chronic progressive lower back pain; expanding aortic sac may be contributory to pain- not definitive. Therefore, preferable to intervene sooner than later." Per ID, would consider in order of importance to 1) treat bacteremia, 2) address aorto-bifem leak, 3) TEE, 4) MRI/CT L-spine. IF we decide to do CT, this can be done in-house, vs RI would require transfer. Will plan to proceed w/ IV abx, do vascular procedure tomorrow as scheduled, and see how he progresses from there.  10/09: Vascular procedure today to fix graft.  Radiology reached out this morning, concern for small PE noted on CTA abdomen/pelvis.  Holding off on full CTA chest at this time due to concern with contrast/renal function but will plan for heparin. 10/10: Renal function okay. CT w/ contrast lumbar spine - no osteomyelitis noted. CTA chest no PE but Significant mucus within RIGHT mainstem bronchus extending into bronchus intermedius and RIGHT lower lobe. Continue IV fluids and recheck BMP in AM. TEE planned for 10/12.  10/11 TEE plan for tomorrow. Palliative consulted  Consultants:  Vascular surgery - TAA, AAA enlarged, R common iliac aneurysm Nephrology - CKD4 needing contrast study Cardiology - clearance for vascular procedure given CAD/CABG, Chronic HFpEF ID - (+)Bcx  Procedures: 08/16/2022: Repair of aortic aneurysm sac type II endoleak  Antimicrobials:  Ampicillin Ceftriaxone   Subjective: complaining of back pain.  No shortness of breath or any other complaints  Objective: Vitals:   08/18/22 0445 08/18/22 0508 08/18/22 0806 08/18/22 1553  BP: (!) 152/84  (!) 154/79 Marland Kitchen)  165/90  Pulse: (!) 111 88 96 70  Resp: 19  16 18   Temp: 98.6 F (37 C)  98.3 F (36.8 C) 98.1 F (36.7 C)  TempSrc: Oral     SpO2: 96%  94% 98%  Weight:      Height:        Intake/Output Summary (Last 24 hours)  at 08/18/2022 1638 Last data filed at 08/18/2022 1446 Gross per 24 hour  Intake 2460.4 ml  Output 1175 ml  Net 1285.4 ml   Filed Weights   08/14/22 0232 08/15/22 0500 08/16/22 0226  Weight: 99.8 kg 85.4 kg 86.1 kg    Examination: Calm, NAD Cta no w/r Reg s1/s2 no gallop Soft benign +bs No edema Aaoxox3  Mood and affect appropriate in current setting    Data Reviewed: I have personally reviewed following labs and imaging studies  CBC: Recent Labs  Lab 08/14/22 0247 08/15/22 0440 08/16/22 0403 08/17/22 0530 08/18/22 0406  WBC 3.8* 4.4 5.4 5.5 7.2  HGB 8.6* 7.8* 7.9* 8.0* 9.4*  HCT 28.6* 25.5* 25.4* 26.1* 30.0*  MCV 92.9 90.7 91.0 91.3 88.8  PLT 136* 149* 155 136* 194   Basic Metabolic Panel: Recent Labs  Lab 08/14/22 0247 08/15/22 0440 08/16/22 0403 08/17/22 0530 08/18/22 0406  NA 137 137 138 138 141  K 4.6 4.3 4.5 3.9 3.9  CL 103 108 109 109 109  CO2 24 24 22 22  20*  GLUCOSE 104* 104* 106* 88 115*  BUN 31* 32* 30* 28* 30*  CREATININE 2.69* 2.33* 2.10* 2.06* 2.05*  CALCIUM 9.1 8.7* 8.6* 8.4* 8.8*   GFR: Estimated Creatinine Clearance: 30.1 mL/min (A) (by C-G formula based on SCr of 2.05 mg/dL (H)). Liver Function Tests: Recent Labs  Lab 08/17/22 0530  AST 16  ALT 15  ALKPHOS 52  BILITOT 0.7  PROT 5.2*  ALBUMIN 2.7*   No results for input(s): "LIPASE", "AMYLASE" in the last 168 hours. No results for input(s): "AMMONIA" in the last 168 hours. Coagulation Profile: Recent Labs  Lab 08/14/22 1958 08/17/22 1314  INR 1.1 1.2   Cardiac Enzymes: No results for input(s): "CKTOTAL", "CKMB", "CKMBINDEX", "TROPONINI" in the last 168 hours. BNP (last 3 results) No results for input(s): "PROBNP" in the last 8760 hours. HbA1C: No results for input(s): "HGBA1C" in the last 72 hours. CBG: No results for input(s): "GLUCAP" in the last 168 hours. Lipid Profile: No results for input(s): "CHOL", "HDL", "LDLCALC", "TRIG", "CHOLHDL", "LDLDIRECT" in the last  72 hours. Thyroid Function Tests: No results for input(s): "TSH", "T4TOTAL", "FREET4", "T3FREE", "THYROIDAB" in the last 72 hours. Anemia Panel: No results for input(s): "VITAMINB12", "FOLATE", "FERRITIN", "TIBC", "IRON", "RETICCTPCT" in the last 72 hours. Sepsis Labs: Recent Labs  Lab 08/14/22 0247 08/14/22 0357  PROCALCITON 0.37  --   LATICACIDVEN  --  0.9    Recent Results (from the past 240 hour(s))  Culture, blood (routine x 2)     Status: Abnormal   Collection Time: 08/14/22  3:57 AM   Specimen: BLOOD  Result Value Ref Range Status   Specimen Description   Final    BLOOD BLOOD RIGHT ARM Performed at [facility], [address], [place], [place] [address]    Special Requests   Final    BOTTLES DRAWN AEROBIC AND ANAEROBIC Blood Culture adequate volume Performed at [facility], [address] [place], [place] [address]    Culture  Setup Time   Final    Organism ID to follow [facility]  POSITIVE COCCI IN BOTH AEROBIC AND ANAEROBIC BOTTLES CRITICAL RESULT CALLED TO, READ BACK BY AND VERIFIED WITH: PHARMD CHILDS AT 1957 08/14/2022 GAA Performed at [facility], [address] [place], [place] [address]    Culture ENTEROCOCCUS FAECALIS (A)  Final   Report Status 08/17/2022 FINAL  Final   Organism ID, Bacteria ENTEROCOCCUS FAECALIS  Final      Susceptibility   Enterococcus faecalis - MIC*    AMPICILLIN <=2 SENSITIVE Sensitive     VANCOMYCIN 1 SENSITIVE Sensitive     GENTAMICIN SYNERGY SENSITIVE Sensitive     * ENTEROCOCCUS FAECALIS  Culture, blood (routine x 2)     Status: Abnormal   Collection Time: 08/14/22  3:57 AM   Specimen: BLOOD  Result Value Ref Range Status   Specimen Description   Final    BLOOD BLOOD LEFT ARM Performed at [facility], [address] [place], [place] [address]    Special Requests   Final    BOTTLES DRAWN AEROBIC AND ANAEROBIC Blood Culture adequate volume Performed at [facility], [address] [place], [place] [address]    Culture  Setup Time   Final    GRAM POSITIVE COCCI AEROBIC BOTTLE ONLY CRITICAL RESULT CALLED TO, READ BACK BY AND VERIFIED WITH: PHARMD CHILDS AT [place] 08/14/2022 GAA GRAM STAIN REVIEWED-AGREE WITH RESULT Performed at [facility], [address] [place], [place] [address]    Culture (A)  Final    ENTEROCOCCUS FAECALIS SUSCEPTIBILITIES PERFORMED ON PREVIOUS CULTURE WITHIN THE LAST 5 DAYS. Performed at [facility], [address] [address] [place], [place] [address]    Report Status 08/17/2022 FINAL  Final  Resp Panel by RT-PCR (Flu A&B, Covid) Anterior Nasal Swab     Status: None   Collection Time: 08/14/22  3:57 AM   Specimen: Anterior Nasal Swab  Result Value Ref Range Status   SARS Coronavirus 2 by RT PCR NEGATIVE NEGATIVE Final    Comment: (NOTE) SARS-CoV-2 target nucleic acids are NOT DETECTED.  The SARS-CoV-2 RNA is generally detectable in upper respiratory specimens during the acute phase of infection. The lowest concentration of SARS-CoV-2 viral copies this assay can detect is 138 copies/mL. A negative result does not preclude SARS-Cov-2 infection and should not be used as the sole basis for treatment or other patient management decisions. A negative result may occur with  improper specimen collection/handling, submission of specimen other than nasopharyngeal swab, presence of viral mutation(s) within the areas targeted by this assay, and inadequate number of viral copies(<138 copies/mL). A negative result must be combined with clinical observations, patient history, and epidemiological information. The expected result is Negative.  Fact Sheet for Patients:  EntrepreneurPulse.com.au  Fact Sheet for Healthcare Providers:  IncredibleEmployment.be  This test is no t yet approved or cleared by the [place] FDA and  has been authorized for detection and/or diagnosis of SARS-CoV-2  by FDA under an Emergency Use Authorization (EUA). This EUA will remain  in effect (meaning this test can be used) for the duration of the COVID-19 declaration under Section 564(b)(1) of the Act, 21 U.S.C.section 360bbb-3(b)(1), unless the authorization is terminated  or revoked sooner.       Influenza A by PCR NEGATIVE NEGATIVE Final   Influenza B by PCR NEGATIVE NEGATIVE Final    Comment: (NOTE) The Xpert Xpress SARS-CoV-2/FLU/RSV plus assay is intended as an aid in the diagnosis of influenza from Nasopharyngeal swab specimens and should not be used as a  sole basis for treatment. Nasal washings and aspirates are unacceptable for Xpert Xpress SARS-CoV-2/FLU/RSV testing.  Fact Sheet for Patients: EntrepreneurPulse.com.au  Fact Sheet for Healthcare Providers: IncredibleEmployment.be  This test is not yet approved or cleared by the [place] FDA and has been authorized for detection and/or diagnosis of SARS-CoV-2 by FDA under an Emergency Use Authorization (EUA). This EUA will remain in effect (meaning this test can be used) for the duration of the COVID-19 declaration under Section 564(b)(1) of the Act, 21 U.S.C. section 360bbb-3(b)(1), unless the authorization is terminated or revoked.  Performed at [facility], [address] [place], [place] [address]   Blood Culture ID Panel (Reflexed)     Status: Abnormal   Collection Time: 08/14/22  3:57 AM  Result Value Ref Range Status   Enterococcus faecalis DETECTED (A) NOT DETECTED Final    Comment: CRITICAL RESULT CALLED TO, READ BACK BY AND VERIFIED WITH: PHARMD CHILDS AT 1957 08/14/2022 GAA    Enterococcus Faecium NOT DETECTED NOT DETECTED Final   Listeria monocytogenes NOT DETECTED NOT DETECTED Final   Staphylococcus species NOT DETECTED NOT DETECTED Final   Staphylococcus aureus (BCID) NOT DETECTED NOT DETECTED Final   Staphylococcus epidermidis NOT DETECTED NOT DETECTED  Final   Staphylococcus lugdunensis NOT DETECTED NOT DETECTED Final   Streptococcus species NOT DETECTED NOT DETECTED Final   Streptococcus agalactiae NOT DETECTED NOT DETECTED Final   Streptococcus pneumoniae NOT DETECTED NOT DETECTED Final   Streptococcus pyogenes NOT DETECTED NOT DETECTED Final   A.calcoaceticus-baumannii NOT DETECTED NOT DETECTED Final   Bacteroides fragilis NOT DETECTED NOT DETECTED Final   Enterobacterales NOT DETECTED NOT DETECTED Final   Enterobacter cloacae complex NOT DETECTED NOT DETECTED Final   Escherichia coli NOT DETECTED NOT DETECTED Final   Klebsiella aerogenes NOT DETECTED NOT DETECTED Final   Klebsiella oxytoca NOT DETECTED NOT DETECTED Final   Klebsiella pneumoniae NOT DETECTED NOT DETECTED Final   Proteus species NOT DETECTED NOT DETECTED Final   Salmonella species NOT DETECTED NOT DETECTED Final   Serratia marcescens NOT DETECTED NOT DETECTED Final   Haemophilus influenzae NOT DETECTED NOT DETECTED Final   Neisseria meningitidis NOT DETECTED NOT DETECTED Final   Pseudomonas aeruginosa NOT DETECTED NOT DETECTED Final   Stenotrophomonas maltophilia NOT DETECTED NOT DETECTED Final   Candida albicans NOT DETECTED NOT DETECTED Final   Candida auris NOT DETECTED NOT DETECTED Final   Candida glabrata NOT DETECTED NOT DETECTED Final   Candida krusei NOT DETECTED NOT DETECTED Final   Candida parapsilosis NOT DETECTED NOT DETECTED Final   Candida tropicalis NOT DETECTED NOT DETECTED Final   Cryptococcus neoformans/gattii NOT DETECTED NOT DETECTED Final   Vancomycin resistance NOT DETECTED NOT DETECTED Final    Comment: Performed at [facility], [address] [place], [place] [address]  Urine Culture     Status: Abnormal   Collection Time: 08/14/22  6:54 AM   Specimen: Urine, Clean Catch  Result Value Ref Range Status   Specimen Description   Final    URINE, CLEAN CATCH Performed at [facility], [address], [place],  [place] [address]    Special Requests   Final    NONE Performed at [facility], [address] [place], [place] [address]    Culture >=100,000 COLONIES/mL ENTEROCOCCUS FAECALIS (A)  Final   Report Status 08/16/2022 FINAL  Final   Organism ID, Bacteria ENTEROCOCCUS FAECALIS (A)  Final      Susceptibility   Enterococcus faecalis - MIC*  AMPICILLIN <=2 SENSITIVE Sensitive     NITROFURANTOIN <=16 SENSITIVE Sensitive     VANCOMYCIN 1 SENSITIVE Sensitive     * >=100,000 COLONIES/mL ENTEROCOCCUS FAECALIS  Culture, blood (Routine X 2) w Reflex to ID Panel     Status: None (Preliminary result)   Collection Time: 08/15/22 12:22 PM   Specimen: Right Antecubital; Blood  Result Value Ref Range Status   Specimen Description RIGHT ANTECUBITAL  Final   Special Requests   Final    BOTTLES DRAWN AEROBIC AND ANAEROBIC Blood Culture adequate volume   Culture   Final    NO GROWTH 3 DAYS Performed at [facility], [address], [place], [place] [address]    Report Status PENDING  Incomplete  Culture, blood (Routine X 2) w Reflex to ID Panel     Status: None (Preliminary result)   Collection Time: 08/15/22 12:22 PM   Specimen: BLOOD RIGHT ARM  Result Value Ref Range Status   Specimen Description BLOOD RIGHT ARM  Final   Special Requests   Final    BOTTLES DRAWN AEROBIC AND ANAEROBIC Blood Culture adequate volume   Culture   Final    NO GROWTH 3 DAYS Performed at [facility] [place], [address] [place], [place] [address]    Report Status PENDING  Incomplete         Radiology Studies: CT LUMBAR SPINE W CONTRAST  Result Date: 08/17/2022 CLINICAL DATA:  Low back pain EXAM: CT LUMBAR SPINE WITH CONTRAST TECHNIQUE: Multidetector CT imaging of the lumbar spine was performed with intravenous contrast administration. RADIATION DOSE REDUCTION: This exam was performed according to the departmental dose-optimization program which includes automated exposure control, adjustment  of the mA and/or kV according to patient size and/or use of iterative reconstruction technique. CONTRAST:  62mL OMNIPAQUE IOHEXOL 350 MG/ML SOLN COMPARISON:  11/16/2016 CT lumbar spine FINDINGS: Segmentation: 5 lumbar-type vertebral bodies. Alignment: 3 mm retrolisthesis of L1 on L2 and L2 on L3, unchanged. 12 mm anterolisthesis L5 on S1 (grade 2), unchanged. Dextrocurvature of the lumbar spine with compensatory levocurvature of the lower lumbar spine. 6 mm left lateral listhesis of L1 on L2 4 mm right lateral listhesis of L2 on L3. 6 mm right lateral listhesis of L3 on L4. Lateral listhesis also appear unchanged compared to prior exam. Vertebrae: No acute fracture or suspicious osseous lesion. Bilateral L5 pars defects. Status post L5-S1 posterior fusion with evidence of osseous fusion across the disc space. Endplate degenerative changes at L2-L3, eccentric to the left, and L4-L5, eccentric to the right. Paraspinal and other soft tissues: Aortic aneurysm, status post EVAR, which is better evaluated on the 08/14/2022 CTA abdomen pelvis. Redemonstrated left renal cyst, for which no follow-up is currently indicated. Nonobstructing left renal calculus. Disc levels: T12-L1: Left eccentric disc bulge. Mild facet arthropathy. No spinal canal stenosis or significant neural foraminal narrowing. L1-L2: Mild retrolisthesis with mild disc bulge. Mild facet arthropathy. No spinal canal stenosis or neural foraminal narrowing. L2-L3: Mild retrolisthesis and mild disc bulge. Mild facet arthropathy. Narrowing of the lateral recesses. No spinal canal stenosis or neural foraminal narrowing. L3-L4: Mild disc bulge. Mild facet arthropathy. No spinal canal stenosis or neural foraminal narrowing. L4-L5: Right-greater-than-left disc height loss and mild disc bulge. Moderate facet arthropathy. No spinal canal stenosis. Mild bilateral neural foraminal narrowing. L5-S1: Redemonstrated bilateral pars defect with grade 2 anterolisthesis. No  spinal canal stenosis. Moderate bilateral neural foraminal narrowing. IMPRESSION: 1. Status post L5-S1 posterior fusion, with evidence of osseous fusion  across the disc space. 2. 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 3. Multilevel degenerative disc disease, worst at L4-L5 and L5-S1, with mild bilateral neural foraminal narrowing at L4-L5 and moderate bilateral neural foraminal narrowing at L5-S1. 4. No spinal canal stenosis. 5. No acute fracture or traumatic listhesis. Electronically Signed   By: Merilyn Baba M.D.   On: 08/17/2022 18:25   CT Angio Chest Pulmonary Embolism (PE) W or WO Contrast  Result Date: 08/17/2022 CLINICAL DATA:  Known pulmonary embolism, follow-up EXAM: CT ANGIOGRAPHY CHEST WITH CONTRAST TECHNIQUE: Multidetector CT imaging of the chest was performed using the standard protocol during bolus administration of intravenous contrast. Multiplanar CT image reconstructions and MIPs were obtained to evaluate the vascular anatomy. RADIATION DOSE REDUCTION: This exam was performed according to the departmental dose-optimization program which includes automated exposure control, adjustment of the mA and/or kV according to patient size and/or use of iterative reconstruction technique. CONTRAST:  61mL OMNIPAQUE IOHEXOL 350 MG/ML SOLN COMPARISON:  08/14/2022 FINDINGS: Cardiovascular: Extensive atherosclerotic calcifications aorta, proximal great vessels, and coronary arteries. 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 Enlargement of cardiac chambers. Pacemaker leads LEFT ventricle and RIGHT atrial appendage. No pericardial effusion. Pulmonary arteries adequately opacified and patent. No pulmonary emboli identified. Specifically, pulmonary embolus in LEFT upper lobe pulmonary arterial branch on previous exam no longer seen. Mediastinum/Nodes: Esophagus unremarkable. Base of cervical region normal appearance. Beam hardening artifacts from  shoulder prostheses traverse chest. Significant mucus within RIGHT mainstem bronchus extending into bronchus intermedius and RIGHT lower lobe. No adenopathy. Lungs/Pleura: BILATERAL pleural effusions. Emphysematous changes. Mild compressive atelectasis RIGHT lower lobe. No infiltrate or pneumothorax. Upper Abdomen: No significant upper abdominal findings. Musculoskeletal: Osseous structures unremarkable. Review of the MIP images confirms the above findings. IMPRESSION: No evidence of pulmonary embolism; resolution of small pulmonary embolus in a LEFT upper lobe pulmonary arterial branch seen on previous study. Significant mucus within RIGHT mainstem bronchus extending into bronchus intermedius and RIGHT lower lobe. BILATERAL pleural effusions and compressive atelectasis RIGHT lower lobe. Extensive atherosclerotic calcifications including coronary arteries. 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 This recommendation follows 2010 ACCF/AHA/AATS/ACR/ASA/SCA/SCAI/SIR/STS/SVM Guidelines for the Diagnosis and Management of Patients with Thoracic Aortic Disease. Circulation. 2010; 121: G644-I347. Aortic aneurysm NOS (ICD10-I71.9) Aortic Atherosclerosis (ICD10-I70.0). Aortic aneurysm NOS (ICD10-I71.9). Emphysema (ICD10-J43.9). Electronically Signed   By: Lavonia Dana M.D.   On: 08/17/2022 17:18   ECHOCARDIOGRAM COMPLETE  Result Date: 08/17/2022    ECHOCARDIOGRAM REPORT   Patient Name:   Rick Basim Bartnik. Date of Exam: 08/17/2022 Medical Rec #:  [id number]             Height:       71.0 in Accession #:    [id number]            Weight:       189.8 lb Date of Birth:  1941-08-12             BSA:          2.062 m Patient Age:    26 years              BP:           170/93 mmHg Patient Gender: M                     HR:           88 bpm.  Exam Location:  [facility] Procedure: 2D Echo, Cardiac Doppler and Color Doppler Indications:     Bacteremia R78.81   History:         Patient has prior history of Echocardiogram examinations, most                  recent 06/14/2022. CHF, Prior CABG and Pacemaker, COPD; Risk                  Factors:Former Smoker and Hypertension.  Sonographer:     Sherrie Sport Referring Phys:  Leonidas DEW Diagnosing Phys: Yolonda Kida MD  Sonographer Comments: Technically difficult study due to poor echo windows, no apical window and no subcostal window. IMPRESSIONS  1. Left ventricular ejection fraction, by estimation, is 60 to 65%. The left ventricle has normal function. The left ventricle has no regional wall motion abnormalities. Left ventricular diastolic parameters were normal.  2. Right ventricular systolic function is normal. The right ventricular size is normal.  3. The mitral valve is normal in structure. No evidence of mitral valve regurgitation.  4. The aortic valve is normal in structure. Aortic valve regurgitation is not visualized. FINDINGS  Left Ventricle: Left ventricular ejection fraction, by estimation, is 60 to 65%. The left ventricle has normal function. The left ventricle has no regional wall motion abnormalities. The left ventricular internal cavity size was normal in size. There is  no left ventricular hypertrophy. Left ventricular diastolic parameters were normal. Right Ventricle: The right ventricular size is normal. No increase in right ventricular wall thickness. Right ventricular systolic function is normal. Left Atrium: Left atrial size was normal in size. Right Atrium: Right atrial size was normal in size. Pericardium: There is no evidence of pericardial effusion. Mitral Valve: The mitral valve is normal in structure. No evidence of mitral valve regurgitation. Tricuspid Valve: The tricuspid valve is normal in structure. Tricuspid valve regurgitation is trivial. Aortic Valve: The aortic valve is normal in structure. Aortic valve regurgitation is not visualized. Pulmonic Valve: The pulmonic valve was normal in  structure. Pulmonic valve regurgitation is not visualized. Aorta: The ascending aorta was not well visualized. IAS/Shunts: No atrial level shunt detected by color flow Doppler.  LEFT VENTRICLE PLAX 2D LVIDd:         4.90 cm LVIDs:         3.30 cm LV PW:         1.10 cm LV IVS:        1.00 cm LVOT diam:     2.20 cm LVOT Area:     3.80 cm  LEFT ATRIUM         Index LA diam:    4.70 cm 2.28 cm/m   AORTA Ao Root diam: 3.80 cm  SHUNTS Systemic Diam: 2.20 cm Dwayne D Callwood MD Electronically signed by Yolonda Kida MD Signature Date/Time: 08/17/2022/5:09:42 PM    Final         Scheduled Meds:  atorvastatin  40 mg Oral QHS   calcitRIOL  0.25 mcg Oral q AM   clopidogrel  75 mg Oral Daily   donepezil  10 mg Oral QHS   ezetimibe  10 mg Oral QPM   ferrous sulfate  325 mg Oral Q2000   heparin injection (subcutaneous)  5,000 Units Subcutaneous Q8H   ipratropium  2-4 spray Each Nare QHS   lidocaine  1 patch Transdermal Q24H   loratadine  10 mg Oral Daily   melatonin  10 mg Oral  QHS   methocarbamol  500 mg Oral Q2000   metoprolol succinate  25 mg Oral Daily   multivitamin with minerals  1 tablet Oral QPC lunch   oxyCODONE-acetaminophen  2 tablet Oral Q6H   pantoprazole  40 mg Oral QAC breakfast   polyethylene glycol  17 g Oral BID   predniSONE  40 mg Oral Q breakfast   senna-docusate  2 tablet Oral BID   zolpidem  10 mg Oral QHS   Continuous Infusions:  sodium chloride 100 mL/hr at 08/18/22 0830   ampicillin (OMNIPEN) IV 2 g (08/18/22 1421)   cefTRIAXone (ROCEPHIN)  IV 2 g (08/18/22 0931)    Assessment & Plan:   Principal Problem:   Complication of aortic graft Active Problems:   COPD exacerbation (HCC)   AAA (abdominal aortic aneurysm) without rupture ([place])   Thoracic aortic aneurysm without rupture ([place])   Aneurysm of right common iliac artery (HCC)   CAD S/P CABG x 3   Myocardial injury   Essential hypertension   Chronic diastolic CHF (congestive heart failure) (HCC)    Chronic kidney disease, stage IV (severe) (HCC)   HLD (hyperlipidemia)   Iron deficiency anemia   TIA (transient ischemic attack)   Mild cognitive impairment   Low back pain   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 Lucky Cowboy. Now the size has enlarged from 6.9 to 7.6 cm 10/11 sp repair on 10/9 by Dr. Lucky Cowboy     Severe back pain Lumbar OA, DDD Multiple lobar spine hardware Pain control as able  May consider neurosurgery consult  10/11 ct spine with degenerative disc dz, no acute issues Pain control, lidocaine patch. ?mri   CAD S/P CABG x 3 Hx of CAD s/p of CABG and myocardial injury due to COPD exacerbation:  Myocardial injury less likely vs Troponin elevation d/t CKD4 Troponin level 31, 30.  Denies chest pain. Continue home Lipitor, Zetia hold ASA consulted Dr. Saralyn Pilar of cardiology per Dr. Dolores Hoose recommendation - optimized from cardiac standpoint, though risk is present. Troponins likely d/t CKD 10/11 no cp.TEE in am    Low back pain This seems to be a chronic issue, has worsened recently. 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 There is osteopenia degenerative change of the lumbar spine with discogenic mild grade 1 retrolisthesis unchanged at L1-2 and L2-3, slight dextroscoliosis. pain control: As needed Percocet, Tylenol, Dilaudid --> scheduled po opiates w/ prn Dilaudid As needed Robaxin Lidoderm MRI L Spine per ID recs given (+)Bcx -patient has pacemaker, family would like to  avoid transferring to different facility if possible.   10/11 as above   SOB w/ mucus plugging of bronchi noted on CT PE resolved on CTA (recently diagnosed, see above)  Transition heparin to DVT ppx dose now that no PE Given SOB, may consider pulmonary consult to review CT and eval if bronchoscopy warranted   Chronic kidney disease, stage IV (severe) (HCC) Slightly worsening than baseline. Today 08/17/22 Cr 2.06 (vs 2.69 on admission) Monitor renal function  closely by [facility] following - see note for recs re: contrast administration  Planning further contrast studies to eval PE and lumbar spine - increased fluids for now to 100 mL/h and plan to reduce this pending renal function tomorrow.    Positive blood cultures - E Facealis Bacteremia ID to follow Ampicillin 10/11 TEE in am   Possible COPD exacerbation (HCC) - seems less likely given CT findings from today 10/10 see above Patient is a former smoker, smoked more than 20 years.  CT chest did not show focal infiltration, does not seem to have pneumonia.  Procalcitonin elevated at 0.39.   Patient does not meet criteria for sepsis. will admit to tele bed as inpatient Bronchodilators pt was given 80 mg of Solu-Medrol in ED --> will change to oral prednisone 40 mg daily Z pak  Mucinex for cough  Incentive spirometry Urine S. Pneumococcal and legionella antigen sputum culture Nasal cannula oxygen as needed to maintain O2 saturation 93% or greater if pt desat Consider pulmonary consult    Essential hypertension IV hydralazine as needed Metoprolol hold Cozaar since pt will receive contrast    Chronic diastolic CHF (congestive heart failure) ([place]) 2D echo on 06/14/2022 showed EF of 60 to 65% with grade 1 diastolic dysfunction.   CHF seem to be compensated. Check BNP   HLD (hyperlipidemia) Continue to Lipitor and zetia   Iron deficiency anemia Hemoglobin 8.6 (9.7 on 06/15/2022, and 8.6 on 06/13/2022), no active  bleeding. Follow-up with CBC Continue iron supplement   History of TIA (transient ischemic attack) Continue Lipitor Hold ASA and Plavix with pending vascular surgeon's evaluation of his AAA.   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  No acute issues.   Obesity (BMI 30-39.9) BMI= 30.68  and BW= 99.8 Diet and exercise.   Encourage to lose weight.   Anxiety start prn Xanax 0.25 mg bid     DVT prophylaxis: holding pharmacologic given bleed risk , scd Code Status: Family Communication:  Disposition Plan:  Status is: Inpatient Remains inpatient appropriate because: iv treatment, w/u pending. TEE in am        LOS: 4 days   Time spent: 35 min    Nolberto Hanlon, MD [facility] Pager [phone]  If 7PM-7AM, please contact night-coverage 08/18/2022, 4:38 PM

## 2022-08-18 NOTE — Progress Notes (Signed)
[place], [place] 08/18/22  Subjective:   Hospital day # 4  Patient known to our practice from outpatient follow-up of CKD, with Dr Candiss Norse.  Patient is resting quietly, wife at bedside Wife states patient pain not controlled well overnight Patient continues to have muscle spasms     10/10 0701 - 10/11 0700 In: 2341.3 [I.V.:1841.3; IV Piggyback:500] Out: 1150 [Urine:1150] Lab Results  Component Value Date   CREATININE 2.05 (H) 08/18/2022   CREATININE 2.06 (H) 08/17/2022   CREATININE 2.10 (H) 08/16/2022     Objective:  Vital signs in last 24 hours:  Temp:  [98 F (36.7 C)-98.7 F (37.1 C)] 98.3 F (36.8 C) (10/11 0806) Pulse Rate:  [71-111] 96 (10/11 0806) Resp:  [16-19] 16 (10/11 0806) BP: (138-181)/(62-99) 154/79 (10/11 0806) SpO2:  [94 %-99 %] 94 % (10/11 0806)  Weight change:  Filed Weights   08/14/22 0232 08/15/22 0500 08/16/22 0226  Weight: 99.8 kg 85.4 kg 86.1 kg    Intake/Output:    Intake/Output Summary (Last 24 hours) at 08/18/2022 1345 Last data filed at 08/18/2022 0941 Gross per 24 hour  Intake 3199.28 ml  Output 1375 ml  Net 1824.28 ml      Physical Exam: General: elderly gentleman, laying in the bed  HEENT Anicteric, moist oral mucous membranes  Pulm/lungs Normal breathing effort, clear to auscultation  CVS/Heart No rub or gallop  Abdomen:  Soft, nontender  Extremities: No peripheral edema  Neurologic: Alert, oriented, pain with movement of right leg  Skin: No acute rashes          Basic Metabolic Panel:  Recent Labs  Lab 08/14/22 0247 08/15/22 0440 08/16/22 0403 08/17/22 0530 08/18/22 0406  NA 137 137 138 138 141  K 4.6 4.3 4.5 3.9 3.9  CL 103 108 109 109 109  CO2 24 24 22 22  20*  GLUCOSE 104* 104* 106* 88 115*  BUN 31* 32* 30* 28* 30*  CREATININE 2.69* 2.33* 2.10* 2.06* 2.05*  CALCIUM 9.1 8.7* 8.6* 8.4* 8.8*      CBC: Recent Labs  Lab 08/14/22 0247 08/15/22 0440 08/16/22 0403  08/17/22 0530 08/18/22 0406  WBC 3.8* 4.4 5.4 5.5 7.2  HGB 8.6* 7.8* 7.9* 8.0* 9.4*  HCT 28.6* 25.5* 25.4* 26.1* 30.0*  MCV 92.9 90.7 91.0 91.3 88.8  PLT 136* 149* 155 136* 193      No results found for: "HEPBSAG", "HEPBSAB", "HEPBIGM"    Microbiology:  Recent Results (from the past 240 hour(s))  Culture, blood (routine x 2)     Status: Abnormal   Collection Time: 08/14/22  3:57 AM   Specimen: BLOOD  Result Value Ref Range Status   Specimen Description   Final    BLOOD BLOOD RIGHT ARM Performed at [facility], [address] [place], [place] [address]    Special Requests   Final    BOTTLES DRAWN AEROBIC AND ANAEROBIC Blood Culture adequate volume Performed at [facility], [address] [place], [place] [address]    Culture  Setup Time   Final    Organism ID to follow [place] TO, READ BACK BY AND VERIFIED WITH: PHARMD CHILDS AT 1957 08/14/2022 GAA Performed at [facility], [address], [place], [place] [address]    Culture ENTEROCOCCUS FAECALIS (A)  Final   Report Status 08/17/2022 FINAL  Final   Organism ID, Bacteria ENTEROCOCCUS FAECALIS  Final  Susceptibility   Enterococcus faecalis - MIC*    AMPICILLIN <=2 SENSITIVE Sensitive     VANCOMYCIN 1 SENSITIVE Sensitive     GENTAMICIN SYNERGY SENSITIVE Sensitive     * ENTEROCOCCUS FAECALIS  Culture, blood (routine x 2)     Status: Abnormal   Collection Time: 08/14/22  3:57 AM   Specimen: BLOOD  Result Value Ref Range Status   Specimen Description   Final    BLOOD BLOOD LEFT ARM Performed at [facility], [address], [place], [place] [address]    Special Requests   Final    BOTTLES DRAWN AEROBIC AND ANAEROBIC Blood Culture adequate volume Performed at [facility], [address] [place], [place] [address]    Culture  Setup Time   Final    GRAM POSITIVE COCCI AEROBIC BOTTLE  ONLY CRITICAL RESULT CALLED TO, READ BACK BY AND VERIFIED WITH: PHARMD CHILDS AT [place] 08/14/2022 GAA GRAM STAIN REVIEWED-AGREE WITH RESULT Performed at [facility], [address] [place], [place] [address]    Culture (A)  Final    ENTEROCOCCUS FAECALIS SUSCEPTIBILITIES PERFORMED ON PREVIOUS CULTURE WITHIN THE LAST 5 DAYS. Performed at [facility], [place] [address], [place],  [address]    Report Status 08/17/2022 FINAL  Final  Resp Panel by RT-PCR (Flu A&B, Covid) Anterior Nasal Swab     Status: None   Collection Time: 08/14/22  3:57 AM   Specimen: Anterior Nasal Swab  Result Value Ref Range Status   SARS Coronavirus 2 by RT PCR NEGATIVE NEGATIVE Final    Comment: (NOTE) SARS-CoV-2 target nucleic acids are NOT DETECTED.  The SARS-CoV-2 RNA is generally detectable in upper respiratory specimens during the acute phase of infection. The lowest concentration of SARS-CoV-2 viral copies this assay can detect is 138 copies/mL. A negative result does not preclude SARS-Cov-2 infection and should not be used as the sole basis for treatment or other patient management decisions. A negative result may occur with  improper specimen collection/handling, submission of specimen other than nasopharyngeal swab, presence of viral mutation(s) within the areas targeted by this assay, and inadequate number of viral copies(<138 copies/mL). A negative result must be combined with clinical observations, patient history, and epidemiological information. The expected result is Negative.  Fact Sheet for Patients:  EntrepreneurPulse.com.au  Fact Sheet for Healthcare Providers:  IncredibleEmployment.be  This test is no t yet approved or cleared by the [place] FDA and  has been authorized for detection and/or diagnosis of SARS-CoV-2 by FDA under an Emergency Use Authorization (EUA). This EUA will remain  in effect (meaning this test can be  used) for the duration of the COVID-19 declaration under Section 564(b)(1) of the Act, 21 U.S.C.section 360bbb-3(b)(1), unless the authorization is terminated  or revoked sooner.       Influenza A by PCR NEGATIVE NEGATIVE Final   Influenza B by PCR NEGATIVE NEGATIVE Final    Comment: (NOTE) The Xpert Xpress SARS-CoV-2/FLU/RSV plus assay is intended as an aid in the diagnosis of influenza from Nasopharyngeal swab specimens and should not be used as a sole basis for treatment. Nasal washings and aspirates are unacceptable for Xpert Xpress SARS-CoV-2/FLU/RSV testing.  Fact Sheet for Patients: EntrepreneurPulse.com.au  Fact Sheet for Healthcare Providers: IncredibleEmployment.be  This test is not yet approved or cleared by the [place] FDA and has been authorized for detection and/or diagnosis of SARS-CoV-2 by FDA under an Emergency Use Authorization (EUA). This EUA will remain in effect (meaning  this test can be used) for the duration of the COVID-19 declaration under Section 564(b)(1) of the Act, 21 U.S.C. section 360bbb-3(b)(1), unless the authorization is terminated or revoked.  Performed at [facility], [address] [place], [place] [address]   Blood Culture ID Panel (Reflexed)     Status: Abnormal   Collection Time: 08/14/22  3:57 AM  Result Value Ref Range Status   Enterococcus faecalis DETECTED (A) NOT DETECTED Final    Comment: CRITICAL RESULT CALLED TO, READ BACK BY AND VERIFIED WITH: PHARMD CHILDS AT 1957 08/14/2022 GAA    Enterococcus Faecium NOT DETECTED NOT DETECTED Final   Listeria monocytogenes NOT DETECTED NOT DETECTED Final   Staphylococcus species NOT DETECTED NOT DETECTED Final   Staphylococcus aureus (BCID) NOT DETECTED NOT DETECTED Final   Staphylococcus epidermidis NOT DETECTED NOT DETECTED Final   Staphylococcus lugdunensis NOT DETECTED NOT DETECTED Final   Streptococcus species NOT DETECTED NOT  DETECTED Final   Streptococcus agalactiae NOT DETECTED NOT DETECTED Final   Streptococcus pneumoniae NOT DETECTED NOT DETECTED Final   Streptococcus pyogenes NOT DETECTED NOT DETECTED Final   A.calcoaceticus-baumannii NOT DETECTED NOT DETECTED Final   Bacteroides fragilis NOT DETECTED NOT DETECTED Final   Enterobacterales NOT DETECTED NOT DETECTED Final   Enterobacter cloacae complex NOT DETECTED NOT DETECTED Final   Escherichia coli NOT DETECTED NOT DETECTED Final   Klebsiella aerogenes NOT DETECTED NOT DETECTED Final   Klebsiella oxytoca NOT DETECTED NOT DETECTED Final   Klebsiella pneumoniae NOT DETECTED NOT DETECTED Final   Proteus species NOT DETECTED NOT DETECTED Final   Salmonella species NOT DETECTED NOT DETECTED Final   Serratia marcescens NOT DETECTED NOT DETECTED Final   Haemophilus influenzae NOT DETECTED NOT DETECTED Final   Neisseria meningitidis NOT DETECTED NOT DETECTED Final   Pseudomonas aeruginosa NOT DETECTED NOT DETECTED Final   Stenotrophomonas maltophilia NOT DETECTED NOT DETECTED Final   Candida albicans NOT DETECTED NOT DETECTED Final   Candida auris NOT DETECTED NOT DETECTED Final   Candida glabrata NOT DETECTED NOT DETECTED Final   Candida krusei NOT DETECTED NOT DETECTED Final   Candida parapsilosis NOT DETECTED NOT DETECTED Final   Candida tropicalis NOT DETECTED NOT DETECTED Final   Cryptococcus neoformans/gattii NOT DETECTED NOT DETECTED Final   Vancomycin resistance NOT DETECTED NOT DETECTED Final    Comment: Performed at [facility], [address], [place], [place] [address]  Urine Culture     Status: Abnormal   Collection Time: 08/14/22  6:54 AM   Specimen: Urine, Clean Catch  Result Value Ref Range Status   Specimen Description   Final    URINE, CLEAN CATCH Performed at [facility], [address] [place], [place] [address]    Special Requests   Final    NONE Performed at [facility], [address]  [place], [place] [address]    Culture >=100,000 COLONIES/mL ENTEROCOCCUS FAECALIS (A)  Final   Report Status 08/16/2022 FINAL  Final   Organism ID, Bacteria ENTEROCOCCUS FAECALIS (A)  Final      Susceptibility   Enterococcus faecalis - MIC*    AMPICILLIN <=2 SENSITIVE Sensitive     NITROFURANTOIN <=16 SENSITIVE Sensitive     VANCOMYCIN 1 SENSITIVE Sensitive     * >=100,000 COLONIES/mL ENTEROCOCCUS FAECALIS  Culture, blood (Routine X 2) w Reflex to ID Panel     Status: None (Preliminary result)   Collection Time: 08/15/22 12:22 PM   Specimen: Right Antecubital; Blood  Result Value  Ref Range Status   Specimen Description RIGHT ANTECUBITAL  Final   Special Requests   Final    BOTTLES DRAWN AEROBIC AND ANAEROBIC Blood Culture adequate volume   Culture   Final    NO GROWTH 3 DAYS Performed at [facility], [address], [place], [place] [address]    Report Status PENDING  Incomplete  Culture, blood (Routine X 2) w Reflex to ID Panel     Status: None (Preliminary result)   Collection Time: 08/15/22 12:22 PM   Specimen: BLOOD RIGHT ARM  Result Value Ref Range Status   Specimen Description BLOOD RIGHT ARM  Final   Special Requests   Final    BOTTLES DRAWN AEROBIC AND ANAEROBIC Blood Culture adequate volume   Culture   Final    NO GROWTH 3 DAYS Performed at [facility], [address], [place], [place] [address]    Report Status PENDING  Incomplete    Coagulation Studies: Recent Labs    08/17/22 1314  LABPROT 14.9  INR 1.2     Urinalysis: No results for input(s): "COLORURINE", "LABSPEC", "PHURINE", "GLUCOSEU", "HGBUR", "BILIRUBINUR", "KETONESUR", "PROTEINUR", "UROBILINOGEN", "NITRITE", "LEUKOCYTESUR" in the last 72 hours.  Invalid input(s): "APPERANCEUR"     Imaging: CT LUMBAR SPINE W CONTRAST  Result Date: 08/17/2022 CLINICAL DATA:  Low back pain EXAM: CT LUMBAR SPINE WITH CONTRAST TECHNIQUE: Multidetector CT imaging of the lumbar spine was performed  with intravenous contrast administration. RADIATION DOSE REDUCTION: This exam was performed according to the departmental dose-optimization program which includes automated exposure control, adjustment of the mA and/or kV according to patient size and/or use of iterative reconstruction technique. CONTRAST:  89mL OMNIPAQUE IOHEXOL 350 MG/ML SOLN COMPARISON:  11/16/2016 CT lumbar spine FINDINGS: Segmentation: 5 lumbar-type vertebral bodies. Alignment: 3 mm retrolisthesis of L1 on L2 and L2 on L3, unchanged. 12 mm anterolisthesis L5 on S1 (grade 2), unchanged. Dextrocurvature of the lumbar spine with compensatory levocurvature of the lower lumbar spine. 6 mm left lateral listhesis of L1 on L2 4 mm right lateral listhesis of L2 on L3. 6 mm right lateral listhesis of L3 on L4. Lateral listhesis also appear unchanged compared to prior exam. Vertebrae: No acute fracture or suspicious osseous lesion. Bilateral L5 pars defects. Status post L5-S1 posterior fusion with evidence of osseous fusion across the disc space. Endplate degenerative changes at L2-L3, eccentric to the left, and L4-L5, eccentric to the right. Paraspinal and other soft tissues: Aortic aneurysm, status post EVAR, which is better evaluated on the 08/14/2022 CTA abdomen pelvis. Redemonstrated left renal cyst, for which no follow-up is currently indicated. Nonobstructing left renal calculus. Disc levels: T12-L1: Left eccentric disc bulge. Mild facet arthropathy. No spinal canal stenosis or significant neural foraminal narrowing. L1-L2: Mild retrolisthesis with mild disc bulge. Mild facet arthropathy. No spinal canal stenosis or neural foraminal narrowing. L2-L3: Mild retrolisthesis and mild disc bulge. Mild facet arthropathy. Narrowing of the lateral recesses. No spinal canal stenosis or neural foraminal narrowing. L3-L4: Mild disc bulge. Mild facet arthropathy. No spinal canal stenosis or neural foraminal narrowing. L4-L5: Right-greater-than-left disc height  loss and mild disc bulge. Moderate facet arthropathy. No spinal canal stenosis. Mild bilateral neural foraminal narrowing. L5-S1: Redemonstrated bilateral pars defect with grade 2 anterolisthesis. No spinal canal stenosis. Moderate bilateral neural foraminal narrowing. IMPRESSION: 1. Status post L5-S1 posterior fusion, with evidence of osseous fusion across the disc space. 2. 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 3. Multilevel degenerative disc  disease, worst at L4-L5 and L5-S1, with mild bilateral neural foraminal narrowing at L4-L5 and moderate bilateral neural foraminal narrowing at L5-S1. 4. No spinal canal stenosis. 5. No acute fracture or traumatic listhesis. Electronically Signed   By: Merilyn Baba M.D.   On: 08/17/2022 18:25   CT Angio Chest Pulmonary Embolism (PE) W or WO Contrast  Result Date: 08/17/2022 CLINICAL DATA:  Known pulmonary embolism, follow-up EXAM: CT ANGIOGRAPHY CHEST WITH CONTRAST TECHNIQUE: Multidetector CT imaging of the chest was performed using the standard protocol during bolus administration of intravenous contrast. Multiplanar CT image reconstructions and MIPs were obtained to evaluate the vascular anatomy. RADIATION DOSE REDUCTION: This exam was performed according to the departmental dose-optimization program which includes automated exposure control, adjustment of the mA and/or kV according to patient size and/or use of iterative reconstruction technique. CONTRAST:  54mL OMNIPAQUE IOHEXOL 350 MG/ML SOLN COMPARISON:  08/14/2022 FINDINGS: Cardiovascular: Extensive atherosclerotic calcifications aorta, proximal great vessels, and coronary arteries. 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 Enlargement of cardiac chambers. Pacemaker leads LEFT ventricle and RIGHT atrial appendage. No pericardial effusion. Pulmonary arteries adequately opacified and patent. No pulmonary emboli identified. Specifically, pulmonary  embolus in LEFT upper lobe pulmonary arterial branch on previous exam no longer seen. Mediastinum/Nodes: Esophagus unremarkable. Base of cervical region normal appearance. Beam hardening artifacts from shoulder prostheses traverse chest. Significant mucus within RIGHT mainstem bronchus extending into bronchus intermedius and RIGHT lower lobe. No adenopathy. Lungs/Pleura: BILATERAL pleural effusions. Emphysematous changes. Mild compressive atelectasis RIGHT lower lobe. No infiltrate or pneumothorax. Upper Abdomen: No significant upper abdominal findings. Musculoskeletal: Osseous structures unremarkable. Review of the MIP images confirms the above findings. IMPRESSION: No evidence of pulmonary embolism; resolution of small pulmonary embolus in a LEFT upper lobe pulmonary arterial branch seen on previous study. Significant mucus within RIGHT mainstem bronchus extending into bronchus intermedius and RIGHT lower lobe. BILATERAL pleural effusions and compressive atelectasis RIGHT lower lobe. Extensive atherosclerotic calcifications including coronary arteries. 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 This recommendation follows 2010 ACCF/AHA/AATS/ACR/ASA/SCA/SCAI/SIR/STS/SVM Guidelines for the Diagnosis and Management of Patients with Thoracic Aortic Disease. Circulation. 2010; 121: H829-H371. Aortic aneurysm NOS (ICD10-I71.9) Aortic Atherosclerosis (ICD10-I70.0). Aortic aneurysm NOS (ICD10-I71.9). Emphysema (ICD10-J43.9). Electronically Signed   By: Lavonia Dana M.D.   On: 08/17/2022 17:18   ECHOCARDIOGRAM COMPLETE  Result Date: 08/17/2022    ECHOCARDIOGRAM REPORT   Patient Name:   Rick Mcbride. Date of Exam: 08/17/2022 Medical Rec #:  [id number]             Height:       71.0 in Accession #:    [id number]            Weight:       189.8 lb Date of Birth:  12/24/1940             BSA:          2.062 m Patient Age:    80 years               BP:           170/93 mmHg Patient Gender: M                     HR:           88 bpm. Exam Location:  [facility] Procedure: 2D Echo, Cardiac Doppler and Color Doppler Indications:     Bacteremia R78.81  History:  Patient has prior history of Echocardiogram examinations, most                  recent 06/14/2022. CHF, Prior CABG and Pacemaker, COPD; Risk                  Factors:Former Smoker and Hypertension.  Sonographer:     Sherrie Sport Referring Phys:  [place] DEW Diagnosing Phys: Yolonda Kida MD  Sonographer Comments: Technically difficult study due to poor echo windows, no apical window and no subcostal window. IMPRESSIONS  1. Left ventricular ejection fraction, by estimation, is 60 to 65%. The left ventricle has normal function. The left ventricle has no regional wall motion abnormalities. Left ventricular diastolic parameters were normal.  2. Right ventricular systolic function is normal. The right ventricular size is normal.  3. The mitral valve is normal in structure. No evidence of mitral valve regurgitation.  4. The aortic valve is normal in structure. Aortic valve regurgitation is not visualized. FINDINGS  Left Ventricle: Left ventricular ejection fraction, by estimation, is 60 to 65%. The left ventricle has normal function. The left ventricle has no regional wall motion abnormalities. The left ventricular internal cavity size was normal in size. There is  no left ventricular hypertrophy. Left ventricular diastolic parameters were normal. Right Ventricle: The right ventricular size is normal. No increase in right ventricular wall thickness. Right ventricular systolic function is normal. Left Atrium: Left atrial size was normal in size. Right Atrium: Right atrial size was normal in size. Pericardium: There is no evidence of pericardial effusion. Mitral Valve: The mitral valve is normal in structure. No evidence of mitral valve regurgitation. Tricuspid Valve: The tricuspid valve is normal in  structure. Tricuspid valve regurgitation is trivial. Aortic Valve: The aortic valve is normal in structure. Aortic valve regurgitation is not visualized. Pulmonic Valve: The pulmonic valve was normal in structure. Pulmonic valve regurgitation is not visualized. Aorta: The ascending aorta was not well visualized. IAS/Shunts: No atrial level shunt detected by color flow Doppler.  LEFT VENTRICLE PLAX 2D LVIDd:         4.90 cm LVIDs:         3.30 cm LV PW:         1.10 cm LV IVS:        1.00 cm LVOT diam:     2.20 cm LVOT Area:     3.80 cm  LEFT ATRIUM         Index LA diam:    4.70 cm 2.28 cm/m   AORTA Ao Root diam: 3.80 cm  SHUNTS Systemic Diam: 2.20 cm Yolonda Kida MD Electronically signed by Yolonda Kida MD Signature Date/Time: 08/17/2022/5:09:42 PM    Final    PERIPHERAL VASCULAR CATHETERIZATION  Result Date: 08/16/2022 See surgical note for result.    Medications:    sodium chloride 100 mL/hr at 08/18/22 0830   ampicillin (OMNIPEN) IV 2 g (08/18/22 0557)   cefTRIAXone (ROCEPHIN)  IV 2 g (08/18/22 0931)    atorvastatin  40 mg Oral QHS   calcitRIOL  0.25 mcg Oral q AM   clopidogrel  75 mg Oral Daily   donepezil  10 mg Oral QHS   ezetimibe  10 mg Oral QPM   ferrous sulfate  325 mg Oral Q2000   heparin injection (subcutaneous)  5,000 Units Subcutaneous Q8H   ipratropium  2-4 spray Each Nare QHS   lidocaine  1 patch Transdermal Q24H   loratadine  10  mg Oral Daily   melatonin  10 mg Oral QHS   methocarbamol  500 mg Oral Q2000   metoprolol succinate  25 mg Oral Daily   multivitamin with minerals  1 tablet Oral QPC lunch   oxyCODONE-acetaminophen  2 tablet Oral Q6H   pantoprazole  40 mg Oral QAC breakfast   polyethylene glycol  17 g Oral BID   predniSONE  40 mg Oral Q breakfast   senna-docusate  2 tablet Oral BID   zolpidem  10 mg Oral QHS   albuterol, ALPRAZolam, dextromethorphan-guaiFENesin, hydrALAZINE, HYDROmorphone (DILAUDID) injection, methocarbamol, ondansetron (ZOFRAN)  IV  Assessment/ Plan:  80 y.o. 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  Urinalysis shows small proteinuria.  Concern about IV contrast exposure causing worsening of kidney function.  Overall this is a difficult situation.  Patient does require proper assessment of aorta (which necessitates IV contrast exposure) prior to planning endovascular repair. 2D echo from 06/14/2022 shows LVEF 60 to 88%, grade 1 diastolic dysfunction  Plan: Creatinine remained stable with adequate urine output.  We will suggest continuing IV fluids for now.  Patient did receive CT chest and spine yesterday with contrast.  We will monitor renal function with contrast exposure.  TEE planned for Thursday.    LOS: [place] 10/11/20231:45 PM  [place], [place]  Note: This note was prepared with Dragon dictation. Any transcription errors are unintentional

## 2022-08-18 NOTE — Progress Notes (Signed)
ID Pt still in pain But better than yesterday  O/e Awake and alert Patient Vitals for the past 24 hrs:  BP Temp Temp src Pulse Resp SpO2  08/18/22 0806 (!) 154/79 98.3 F (36.8 C) -- 96 16 94 %  08/18/22 0508 -- -- -- 88 -- --  08/18/22 0445 (!) 152/84 98.6 F (37 C) Oral (!) 111 19 96 %  08/18/22 0009 (!) 181/99 98.3 F (36.8 C) Oral 97 18 97 %  08/17/22 1932 (!) 150/62 98.7 F (37.1 C) Oral 79 19 98 %  08/17/22 1613 138/76 98 F (36.7 C) -- 71 16 99 %   Chest b/l air entry [id number] Pacemaker Abd soft CNS moves all extremities  Labs    Latest Ref Rng & Units 08/18/2022    4:06 AM 08/17/2022    5:30 AM 08/16/2022    4:03 AM  CBC  WBC 4.0 - 10.5 K/uL 7.2  5.5  5.4   Hemoglobin 13.0 - 17.0 g/dL 9.4  8.0  7.9   Hematocrit 39.0 - 52.0 % 30.0  26.1  25.4   Platelets 150 - 400 K/uL 193  136  155        Latest Ref Rng & Units 08/18/2022    4:06 AM 08/17/2022    5:30 AM 08/16/2022    4:03 AM  CMP  Glucose 70 - 99 mg/dL 115  88  106   BUN 8 - 23 mg/dL 30  28  30    Creatinine 0.61 - 1.24 mg/dL 2.05  2.06  2.10   Sodium 135 - 145 mmol/L 141  138  138   Potassium 3.5 - 5.1 mmol/L 3.9  3.9  4.5   Chloride 98 - 111 mmol/L 109  109  109   CO2 22 - 32 mmol/L 20  22  22    Calcium 8.9 - 10.3 mg/dL 8.8  8.4  8.6   Total Protein 6.5 - 8.1 g/dL  5.2    Total Bilirubin 0.3 - 1.2 mg/dL  0.7    Alkaline Phos 38 - 126 U/L  52    AST 15 - 41 U/L  16    ALT 0 - 44 U/L  15      Micro BC- 08/14/22 Enterococcis UC - enterococcus 08/15/22 BC   Impression/recommendation  Low back pain- has previous L5-s1 fusion Need to r/o discitis. Epidural abscess or osteo PT needs MRI but he has pacemaker , so he will need to go to [facility] for MRI    Enterococcus bacteremia-  Concern for endocarditis due to pacemaker and also discitis/osteo Source could be the urinary trract as urine culture positive for enterococcus , . HE has a h/o radical prostatectomy Bladder scan around 270 cc    TEE  tomorrow On ceftriaxone / ampicillin    AAA- s/p endovascular stent- worsening aneurysm with endo leak 2. 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

## 2022-08-18 NOTE — Plan of Care (Signed)
  Problem: Education: Goal: Knowledge of disease or condition will improve Outcome: Progressing Goal: Knowledge of the prescribed therapeutic regimen will improve Outcome: Progressing Goal: Individualized Educational Video(s) Outcome: Progressing   Problem: Activity: Goal: Ability to tolerate increased activity will improve Outcome: Progressing Goal: Will verbalize the importance of balancing activity with adequate rest periods Outcome: Progressing   Problem: Respiratory: Goal: Ability to maintain a clear airway will improve Outcome: Progressing Goal: Levels of oxygenation will improve Outcome: Progressing Goal: Ability to maintain adequate ventilation will improve Outcome: Progressing   Problem: Activity: Goal: Ability to tolerate increased activity will improve Outcome: Progressing   Problem: Clinical Measurements: Goal: Ability to maintain a body temperature in the normal range will improve Outcome: Progressing   Problem: Respiratory: Goal: Ability to maintain adequate ventilation will improve Outcome: Progressing Goal: Ability to maintain a clear airway will improve Outcome: Progressing   Problem: Education: Goal: Knowledge of General Education information will improve Description: Including pain rating scale, medication(s)/side effects and non-pharmacologic comfort measures Outcome: Progressing   Problem: Health Behavior/Discharge Planning: Goal: Ability to manage health-related needs will improve Outcome: Progressing   Problem: Clinical Measurements: Goal: Ability to maintain clinical measurements within normal limits will improve Outcome: Progressing Goal: Will remain free from infection Outcome: Progressing Goal: Diagnostic test results will improve Outcome: Progressing Goal: Respiratory complications will improve Outcome: Progressing Goal: Cardiovascular complication will be avoided Outcome: Progressing   Problem: Activity: Goal: Risk for activity  intolerance will decrease Outcome: Progressing   Problem: Nutrition: Goal: Adequate nutrition will be maintained Outcome: Progressing   Problem: Coping: Goal: Level of anxiety will decrease Outcome: Progressing   Problem: Elimination: Goal: Will not experience complications related to bowel motility Outcome: Progressing Goal: Will not experience complications related to urinary retention Outcome: Progressing   Problem: Pain Managment: Goal: General experience of comfort will improve Outcome: Progressing   Problem: Safety: Goal: Ability to remain free from injury will improve Outcome: Progressing   Problem: Skin Integrity: Goal: Risk for impaired skin integrity will decrease Outcome: Progressing   Problem: Education: Goal: Knowledge of General Education information will improve Description: Including pain rating scale, medication(s)/side effects and non-pharmacologic comfort measures Outcome: Progressing   Problem: Health Behavior/Discharge Planning: Goal: Ability to manage health-related needs will improve Outcome: Progressing   Problem: Clinical Measurements: Goal: Ability to maintain clinical measurements within normal limits will improve Outcome: Progressing Goal: Will remain free from infection Outcome: Progressing Goal: Diagnostic test results will improve Outcome: Progressing Goal: Respiratory complications will improve Outcome: Progressing Goal: Cardiovascular complication will be avoided Outcome: Progressing   Problem: Activity: Goal: Risk for activity intolerance will decrease Outcome: Progressing   Problem: Nutrition: Goal: Adequate nutrition will be maintained Outcome: Progressing   Problem: Coping: Goal: Level of anxiety will decrease Outcome: Progressing   Problem: Elimination: Goal: Will not experience complications related to bowel motility Outcome: Progressing Goal: Will not experience complications related to urinary  retention Outcome: Progressing   Problem: Pain Managment: Goal: General experience of comfort will improve Outcome: Progressing   Problem: Safety: Goal: Ability to remain free from injury will improve Outcome: Progressing   Problem: Skin Integrity: Goal: Risk for impaired skin integrity will decrease Outcome: Progressing

## 2022-08-18 NOTE — Progress Notes (Addendum)
Patient was seen evaluated and examined by me and the PA on 08/18/2022.  Course of action, evaluation, and management decisions were developed solely by me, but detailed below in the PA's note.  [facility] Patient ID: [name] MRN: [id number] DOB/AGE: 81-Sep-1942 81 y.o.   Admit date: 08/14/2022 Referring Physician Blaine Hamper Primary Physician [facility] Primary Cardiologist Nehemiah Massed Reason for Consultation preoperative cardiovascular evaluation   HPI: 81 year old gentleman referred for preoperative cardiovascular evaluation prior to abdominal aortic aneurysm repair.  Patient brought to [facility] ED via EMS with chief complaint of lower back pain and generalized weakness.  ECG revealed atrial sensing with ventricular pacing.  Admission labs notable for borderline elevated high-sensitivity troponin (31, 30) in the absence of chest pain, likely elevated due to underlying chronic kidney disease.  Abdominal CT was performed which revealed abdominal aortic aneurysm 7.6 x 7.6 cm which is increased compared to most recent study showing 6.9 x 7.0 cm.  The patient evaluated by vascular surgery who is recommending surgical repair. S/p coil embolization of vessels creating type II endoleak with vascular surgery on 10/9 which went well. He continues to have intermittent spasms of severe back pain post procedureally. Hospital course complicated by E. Faecalis bacteremia.    The patient has known coronary disease, status post CABG with LIMA to LAD, SVG to OM1 and PDA performed 2005 at [facility].  The patient has dual-chamber pacemaker for second-degree AV block.  Patient has known chronic kidney disease.  He has prior history of CVA/TIA.  Most recent 2D echocardiogram 06/14/2022 revealed LVEF 60-65 %.  Lexiscan Myoview 10/24/2019 did not reveal evidence for scar or ischemia.  The patient currently denies chest pain.  He does report intermittent shortness of breath which is felt to be due to possible bronchitis.  Chest x-ray  reveals possible right lower lobe pneumonia.  Interval history: - CT spine neg for osteomyelitis, CTA chest neg for PE but did reveal significant mucus in R mainstem bronchus  -Continues to have severe back pain overnight despite increase in frequency of multimodal pain regimen dosing.  Restless from 11 PM to 4 AM -Complains of hiccups and a dry cough this morning, denies chest pain or significant shortness of breath. -TEE planned for tomorrow morning.   Vitals:   08/18/22 0009 08/18/22 0445 08/18/22 0508 08/18/22 0806  BP: (!) 181/99 (!) 152/84  (!) 154/79  Pulse: 97 (!) 111 88 96  Resp: 18 19  16   Temp: 98.3 F (36.8 C) 98.6 F (37 C)  98.3 F (36.8 C)  TempSrc: Oral Oral    SpO2: 97% 96%  94%  Weight:      Height:         Intake/Output Summary (Last 24 hours) at 08/18/2022 0835 Last data filed at 08/18/2022 0401 Gross per 24 hour  Intake 2341.26 ml  Output 1150 ml  Net 1191.26 ml    PHYSICAL EXAM  General: Pleasant elderly Caucasian male, well nourished, in no acute distress.  Sitting upright in hospital bed with wife at bedside HEENT:  Normocephalic and atraumatic Neck:  No JVD.  Lungs: Normal respiratory effort on room air.  Clear bilaterally to auscultation Heart: Regular rate and rhythm with occasional paced beats. Normal S1 and S2 without gallops or murmurs.  Abdomen: Nondistended appearing  Msk:   Normal strength and tone for age. Extremities: No clubbing, cyanosis or edema.   Neuro: Alert and oriented X 3. Psych:  Good affect, responds appropriately   LABS: Basic Metabolic Panel: Recent  Labs    08/17/22 0530 08/18/22 0406  NA 138 141  K 3.9 3.9  CL 109 109  CO2 22 20*  GLUCOSE 88 115*  BUN 28* 30*  CREATININE 2.06* 2.05*  CALCIUM 8.4* 8.8*    Liver Function Tests: Recent Labs    08/17/22 0530  AST 16  ALT 15  ALKPHOS 52  BILITOT 0.7  PROT 5.2*  ALBUMIN 2.7*    No results for input(s): "LIPASE", "AMYLASE" in the last 72  hours. CBC: Recent Labs    08/17/22 0530 08/18/22 0406  WBC 5.5 7.2  HGB 8.0* 9.4*  HCT 26.1* 30.0*  MCV 91.3 88.8  PLT 136* 193    Cardiac Enzymes: No results for input(s): "CKTOTAL", "CKMB", "CKMBINDEX", "TROPONINI" in the last 72 hours. BNP: Invalid input(s): "POCBNP" D-Dimer: No results for input(s): "DDIMER" in the last 72 hours. Hemoglobin A1C: No results for input(s): "HGBA1C" in the last 72 hours. Fasting Lipid Panel: No results for input(s): "CHOL", "HDL", "LDLCALC", "TRIG", "CHOLHDL", "LDLDIRECT" in the last 72 hours. Thyroid Function Tests: No results for input(s): "TSH", "T4TOTAL", "T3FREE", "THYROIDAB" in the last 72 hours.  Invalid input(s): "FREET3" Anemia Panel: No results for input(s): "VITAMINB12", "FOLATE", "FERRITIN", "TIBC", "IRON", "RETICCTPCT" in the last 72 hours.  CT LUMBAR SPINE W CONTRAST  Result Date: 08/17/2022 CLINICAL DATA:  Low back pain EXAM: CT LUMBAR SPINE WITH CONTRAST TECHNIQUE: Multidetector CT imaging of the lumbar spine was performed with intravenous contrast administration. RADIATION DOSE REDUCTION: This exam was performed according to the departmental dose-optimization program which includes automated exposure control, adjustment of the mA and/or kV according to patient size and/or use of iterative reconstruction technique. CONTRAST:  30mL OMNIPAQUE IOHEXOL 350 MG/ML SOLN COMPARISON:  11/16/2016 CT lumbar spine FINDINGS: Segmentation: 5 lumbar-type vertebral bodies. Alignment: 3 mm retrolisthesis of L1 on L2 and L2 on L3, unchanged. 12 mm anterolisthesis L5 on S1 (grade 2), unchanged. Dextrocurvature of the lumbar spine with compensatory levocurvature of the lower lumbar spine. 6 mm left lateral listhesis of L1 on L2 4 mm right lateral listhesis of L2 on L3. 6 mm right lateral listhesis of L3 on L4. Lateral listhesis also appear unchanged compared to prior exam. Vertebrae: No acute fracture or suspicious osseous lesion. Bilateral L5 pars  defects. Status post L5-S1 posterior fusion with evidence of osseous fusion across the disc space. Endplate degenerative changes at L2-L3, eccentric to the left, and L4-L5, eccentric to the right. Paraspinal and other soft tissues: Aortic aneurysm, status post EVAR, which is better evaluated on the 08/14/2022 CTA abdomen pelvis. Redemonstrated left renal cyst, for which no follow-up is currently indicated. Nonobstructing left renal calculus. Disc levels: T12-L1: Left eccentric disc bulge. Mild facet arthropathy. No spinal canal stenosis or significant neural foraminal narrowing. L1-L2: Mild retrolisthesis with mild disc bulge. Mild facet arthropathy. No spinal canal stenosis or neural foraminal narrowing. L2-L3: Mild retrolisthesis and mild disc bulge. Mild facet arthropathy. Narrowing of the lateral recesses. No spinal canal stenosis or neural foraminal narrowing. L3-L4: Mild disc bulge. Mild facet arthropathy. No spinal canal stenosis or neural foraminal narrowing. L4-L5: Right-greater-than-left disc height loss and mild disc bulge. Moderate facet arthropathy. No spinal canal stenosis. Mild bilateral neural foraminal narrowing. L5-S1: Redemonstrated bilateral pars defect with grade 2 anterolisthesis. No spinal canal stenosis. Moderate bilateral neural foraminal narrowing. IMPRESSION: 1. Status post L5-S1 posterior fusion, with evidence of osseous fusion across the disc space. 2. 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 3. Multilevel  degenerative disc disease, worst at L4-L5 and L5-S1, with mild bilateral neural foraminal narrowing at L4-L5 and moderate bilateral neural foraminal narrowing at L5-S1. 4. No spinal canal stenosis. 5. No acute fracture or traumatic listhesis. Electronically Signed   By: Merilyn Baba M.D.   On: 08/17/2022 18:25   CT Angio Chest Pulmonary Embolism (PE) W or WO Contrast  Result Date: 08/17/2022 CLINICAL DATA:  Known pulmonary embolism, follow-up  EXAM: CT ANGIOGRAPHY CHEST WITH CONTRAST TECHNIQUE: Multidetector CT imaging of the chest was performed using the standard protocol during bolus administration of intravenous contrast. Multiplanar CT image reconstructions and MIPs were obtained to evaluate the vascular anatomy. RADIATION DOSE REDUCTION: This exam was performed according to the departmental dose-optimization program which includes automated exposure control, adjustment of the mA and/or kV according to patient size and/or use of iterative reconstruction technique. CONTRAST:  59mL OMNIPAQUE IOHEXOL 350 MG/ML SOLN COMPARISON:  08/14/2022 FINDINGS: Cardiovascular: Extensive atherosclerotic calcifications aorta, proximal great vessels, and coronary arteries. 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 Enlargement of cardiac chambers. Pacemaker leads LEFT ventricle and RIGHT atrial appendage. No pericardial effusion. Pulmonary arteries adequately opacified and patent. No pulmonary emboli identified. Specifically, pulmonary embolus in LEFT upper lobe pulmonary arterial branch on previous exam no longer seen. Mediastinum/Nodes: Esophagus unremarkable. Base of cervical region normal appearance. Beam hardening artifacts from shoulder prostheses traverse chest. Significant mucus within RIGHT mainstem bronchus extending into bronchus intermedius and RIGHT lower lobe. No adenopathy. Lungs/Pleura: BILATERAL pleural effusions. Emphysematous changes. Mild compressive atelectasis RIGHT lower lobe. No infiltrate or pneumothorax. Upper Abdomen: No significant upper abdominal findings. Musculoskeletal: Osseous structures unremarkable. Review of the MIP images confirms the above findings. IMPRESSION: No evidence of pulmonary embolism; resolution of small pulmonary embolus in a LEFT upper lobe pulmonary arterial branch seen on previous study. Significant mucus within RIGHT mainstem bronchus extending into bronchus intermedius and RIGHT lower lobe.  BILATERAL pleural effusions and compressive atelectasis RIGHT lower lobe. Extensive atherosclerotic calcifications including coronary arteries. 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 This recommendation follows 2010 ACCF/AHA/AATS/ACR/ASA/SCA/SCAI/SIR/STS/SVM Guidelines for the Diagnosis and Management of Patients with Thoracic Aortic Disease. Circulation. 2010; 121: A630-Z601. Aortic aneurysm NOS (ICD10-I71.9) Aortic Atherosclerosis (ICD10-I70.0). Aortic aneurysm NOS (ICD10-I71.9). Emphysema (ICD10-J43.9). Electronically Signed   By: Lavonia Dana M.D.   On: 08/17/2022 17:18   ECHOCARDIOGRAM COMPLETE  Result Date: 08/17/2022    ECHOCARDIOGRAM REPORT   Patient Name:   Kelin Easter Schinke. Date of Exam: 08/17/2022 Medical Rec #:  [id number]             Height:       71.0 in Accession #:    [id number]            Weight:       189.8 lb Date of Birth:  1941-05-07             BSA:          2.062 m Patient Age:    23 years              BP:           170/93 mmHg Patient Gender: M                     HR:           88 bpm. Exam Location:  [facility] Procedure: 2D Echo, Cardiac Doppler and Color Doppler Indications:     Bacteremia R78.81  History:         Patient has prior history of Echocardiogram examinations, most                  recent 06/14/2022. CHF, Prior CABG and Pacemaker, COPD; Risk                  Factors:Former Smoker and Hypertension.  Sonographer:     Sherrie Sport Referring Phys:  Bowman DEW Diagnosing Phys: Yolonda Kida MD  Sonographer Comments: Technically difficult study due to poor echo windows, no apical window and no subcostal window. IMPRESSIONS  1. Left ventricular ejection fraction, by estimation, is 60 to 65%. The left ventricle has normal function. The left ventricle has no regional wall motion abnormalities. Left ventricular diastolic parameters were normal.  2. Right ventricular systolic function is normal. The right  ventricular size is normal.  3. The mitral valve is normal in structure. No evidence of mitral valve regurgitation.  4. The aortic valve is normal in structure. Aortic valve regurgitation is not visualized. FINDINGS  Left Ventricle: Left ventricular ejection fraction, by estimation, is 60 to 65%. The left ventricle has normal function. The left ventricle has no regional wall motion abnormalities. The left ventricular internal cavity size was normal in size. There is  no left ventricular hypertrophy. Left ventricular diastolic parameters were normal. Right Ventricle: The right ventricular size is normal. No increase in right ventricular wall thickness. Right ventricular systolic function is normal. Left Atrium: Left atrial size was normal in size. Right Atrium: Right atrial size was normal in size. Pericardium: There is no evidence of pericardial effusion. Mitral Valve: The mitral valve is normal in structure. No evidence of mitral valve regurgitation. Tricuspid Valve: The tricuspid valve is normal in structure. Tricuspid valve regurgitation is trivial. Aortic Valve: The aortic valve is normal in structure. Aortic valve regurgitation is not visualized. Pulmonic Valve: The pulmonic valve was normal in structure. Pulmonic valve regurgitation is not visualized. Aorta: The ascending aorta was not well visualized. IAS/Shunts: No atrial level shunt detected by color flow Doppler.  LEFT VENTRICLE PLAX 2D LVIDd:         4.90 cm LVIDs:         3.30 cm LV PW:         1.10 cm LV IVS:        1.00 cm LVOT diam:     2.20 cm LVOT Area:     3.80 cm  LEFT ATRIUM         Index LA diam:    4.70 cm 2.28 cm/m   AORTA Ao Root diam: 3.80 cm  SHUNTS Systemic Diam: 2.20 cm Yolonda Kida MD Electronically signed by Yolonda Kida MD Signature Date/Time: 08/17/2022/5:09:42 PM    Final    PERIPHERAL VASCULAR CATHETERIZATION  Result Date: 08/16/2022 See surgical note for result.    TELEMETRY: Sinus tachycardia overnight with  rates in the 110s, currently in sinus rhythm rates 80s.  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  Preoperative cardiovascular assessment.  Based on patients age and comorbidities, the patient poses at least mild to moderate risk for cardiovascular complication during surgical repair of abdominal aortic aneurysm.  The patient appears to be medically optimized.  Patient denies chest pain.  Dual chamber pacemaker appears to be functioning appropriately.  High-sensitivity troponin borderline elevated in the absence of chest pain, most likely elevated due to underlying chronic kidney disease. 2.  Known CAD, CABG x3, currently without chest pain 3.  Abdominal aortic aneurysm, s/p coil embolization with vascular surgery on 10/9 4.  Status post dual-chamber pacemaker for second-degree AV block, with CTA abdomen pelvis showing unusual lead placement extending through an interarterial defect and terminating in the left ventricle, of uncertain clinical significance. 5.  Chronic  kidney disease, stage IIIb 6.  Severe back pain, uncertain etiology, CT spine with contrast negative for osteomyelitis, revealed multilevel degenerative disc disease, worse at L4-L5 and L5-S1 with mild bilateral neural foraminal narrowing at L4-L5 and moderate bilateral neuroforaminal narrowing at L5-S1.  No spinal canal stenosis or acute fracture or traumatic listhesis 7.  E. Faecalis bacteremia, 2D echocardiogram negative for vegetation, ID recommends TEE to rule out pacer lead vegetation/infection 8. ? Pulmonary embolism -ruled out, recent low risk VQ scan, on CTA abdomen pelvis noted small PE, CTA chest negative for PE, revealed significant mucus in the right mainstem bronchus  Recommendations   1.  Agree with current therapy 2.  Transthoracic echocardiogram with preserved LVEF without evidence of vegetation. 3.  Discussed risk, benefit, potential outcomes of TEE with the patient and his wife.  He is agreeable to proceed to determine if there is a vegetation on the pacemaker wire or any heart valve as a source of his bacteremia.  Plan for TEE on Thursday, 10/12 due to staff availability, space logistics with special recoveries.  N.p.o. at midnight Wednesday 10/11.   4.  Ongoing discussions regarding goals of care versus symptom management.  His primary concern remains pain control.  This patient's plan of care was discussed and created with Dr. Clayborn Bigness and he is in agreement.    Tristan Schroeder, PA-C 08/18/2022 8:35 AM

## 2022-08-19 ENCOUNTER — Encounter: Payer: Self-pay | Admitting: Internal Medicine

## 2022-08-19 ENCOUNTER — Inpatient Hospital Stay
Admit: 2022-08-19 | Discharge: 2022-08-19 | Disposition: A | Discharge status: Home / Self Care | Payer: PPO | Attending: Cardiology | Admitting: Cardiology

## 2022-08-19 ENCOUNTER — Encounter
Admission: EM | Disposition: A | Discharge status: Skilled Nursing Facility | Payer: Self-pay | Source: Home / Self Care | Attending: Internal Medicine

## 2022-08-19 DIAGNOSIS — I700 Atherosclerosis of aorta: Secondary | ICD-10-CM | POA: Diagnosis not present

## 2022-08-19 DIAGNOSIS — I951 Orthostatic hypotension: Secondary | ICD-10-CM | POA: Diagnosis not present

## 2022-08-19 DIAGNOSIS — Z7189 Other specified counseling: Secondary | ICD-10-CM

## 2022-08-19 DIAGNOSIS — S72001A Fracture of unspecified part of neck of right femur, initial encounter for closed fracture: Secondary | ICD-10-CM | POA: Diagnosis not present

## 2022-08-19 DIAGNOSIS — I2510 Atherosclerotic heart disease of native coronary artery without angina pectoris: Secondary | ICD-10-CM | POA: Diagnosis not present

## 2022-08-19 DIAGNOSIS — T829XXA Unspecified complication of cardiac and vascular prosthetic device, implant and graft, initial encounter: Secondary | ICD-10-CM | POA: Diagnosis not present

## 2022-08-19 DIAGNOSIS — Z515 Encounter for palliative care: Secondary | ICD-10-CM | POA: Diagnosis not present

## 2022-08-19 DIAGNOSIS — I2699 Other pulmonary embolism without acute cor pulmonale: Secondary | ICD-10-CM | POA: Diagnosis not present

## 2022-08-19 DIAGNOSIS — I723 Aneurysm of iliac artery: Secondary | ICD-10-CM | POA: Diagnosis not present

## 2022-08-19 DIAGNOSIS — N184 Chronic kidney disease, stage 4 (severe): Secondary | ICD-10-CM | POA: Diagnosis not present

## 2022-08-19 DIAGNOSIS — I714 Abdominal aortic aneurysm, without rupture, unspecified: Secondary | ICD-10-CM | POA: Diagnosis not present

## 2022-08-19 DIAGNOSIS — I5030 Unspecified diastolic (congestive) heart failure: Secondary | ICD-10-CM | POA: Diagnosis not present

## 2022-08-19 DIAGNOSIS — M6281 Muscle weakness (generalized): Secondary | ICD-10-CM | POA: Diagnosis not present

## 2022-08-19 DIAGNOSIS — I119 Hypertensive heart disease without heart failure: Secondary | ICD-10-CM | POA: Diagnosis not present

## 2022-08-19 DIAGNOSIS — C3490 Malignant neoplasm of unspecified part of unspecified bronchus or lung: Secondary | ICD-10-CM | POA: Diagnosis not present

## 2022-08-19 HISTORY — PX: TEE WITHOUT CARDIOVERSION: SHX5443

## 2022-08-19 SURGERY — TRANSESOPHAGEAL ECHOCARDIOGRAM (TEE)
Anesthesia: Moderate Sedation

## 2022-08-19 MED ORDER — SODIUM CHLORIDE 0.9 % IV SOLN
2.0000 g | Freq: Four times a day (QID) | INTRAVENOUS | Status: DC
  Administered 2022-08-19 – 2022-08-31 (×47): 2 g via INTRAVENOUS
  Filled 2022-08-19: qty 2000
  Filled 2022-08-19: qty 2
  Filled 2022-08-19 (×3): qty 2000
  Filled 2022-08-19: qty 2
  Filled 2022-08-19: qty 2000
  Filled 2022-08-19: qty 2
  Filled 2022-08-19: qty 2000
  Filled 2022-08-19 (×2): qty 2
  Filled 2022-08-19 (×5): qty 2000
  Filled 2022-08-19: qty 2
  Filled 2022-08-19: qty 2000
  Filled 2022-08-19: qty 2
  Filled 2022-08-19 (×2): qty 2000
  Filled 2022-08-19 (×2): qty 2
  Filled 2022-08-19: qty 2000
  Filled 2022-08-19: qty 2
  Filled 2022-08-19 (×4): qty 2000
  Filled 2022-08-19: qty 2
  Filled 2022-08-19 (×8): qty 2000
  Filled 2022-08-19: qty 2
  Filled 2022-08-19: qty 2000
  Filled 2022-08-19 (×3): qty 2
  Filled 2022-08-19: qty 2000
  Filled 2022-08-19 (×2): qty 2
  Filled 2022-08-19 (×5): qty 2000

## 2022-08-19 MED ORDER — FENTANYL CITRATE (PF) 100 MCG/2ML IJ SOLN
INTRAMUSCULAR | Status: AC
  Filled 2022-08-19: qty 2

## 2022-08-19 MED ORDER — FENTANYL CITRATE (PF) 100 MCG/2ML IJ SOLN
INTRAMUSCULAR | Status: AC | PRN
  Administered 2022-08-19: 25 ug via INTRAVENOUS

## 2022-08-19 MED ORDER — LIDOCAINE VISCOUS HCL 2 % MT SOLN
OROMUCOSAL | Status: AC | PRN
  Administered 2022-08-19: 15 mL via OROMUCOSAL

## 2022-08-19 MED ORDER — BUTAMBEN-TETRACAINE-BENZOCAINE 2-2-14 % EX AERO
INHALATION_SPRAY | CUTANEOUS | Status: AC | PRN
  Administered 2022-08-19: 6 via TOPICAL

## 2022-08-19 MED ORDER — MIDAZOLAM HCL 2 MG/2ML IJ SOLN
INTRAMUSCULAR | Status: AC
  Filled 2022-08-19: qty 4

## 2022-08-19 MED ORDER — LIDOCAINE VISCOUS HCL 2 % MT SOLN
OROMUCOSAL | Status: AC
  Filled 2022-08-19: qty 15

## 2022-08-19 MED ORDER — SODIUM CHLORIDE 0.9 % IV SOLN: INTRAVENOUS | Status: DC

## 2022-08-19 MED ORDER — SODIUM CHLORIDE 0.9 % IV SOLN
12.5000 mg | Freq: Once | INTRAVENOUS | Status: AC
  Administered 2022-08-19: 12.5 mg via INTRAVENOUS
  Filled 2022-08-19: qty 0.5

## 2022-08-19 MED ORDER — BUTAMBEN-TETRACAINE-BENZOCAINE 2-2-14 % EX AERO
INHALATION_SPRAY | CUTANEOUS | Status: AC
  Filled 2022-08-19: qty 5

## 2022-08-19 MED ORDER — MIDAZOLAM HCL 2 MG/2ML IJ SOLN
INTRAMUSCULAR | Status: AC | PRN
  Administered 2022-08-19: 1 mg via INTRAVENOUS

## 2022-08-19 NOTE — Progress Notes (Signed)
PHARMACY NOTE:  ANTIMICROBIAL RENAL DOSAGE ADJUSTMENT  Current antimicrobial regimen includes a mismatch between antimicrobial dosage and estimated renal function.  As per policy approved by the Pharmacy & Therapeutics and Medical Executive Committees, the antimicrobial dosage will be adjusted accordingly.  Current antimicrobial dosage: Ampicillin 2g Q8H  Indication: E. faecalis bacteremia  Renal Function: Estimated Creatinine Clearance: 33 mL/min (A) (by C-G formula based on SCr of 2.05 mg/dL (H)). []      On intermittent HD, scheduled: []      On CRRT    Antimicrobial dosage has been changed to:  Ampicillin 2g Q6H  Additional comments: BSI dosing  Thank you for allowing pharmacy to be a part of this patient's care.  Gretel Acre, PharmD PGY1 Pharmacy Resident 08/19/2022 12:49 PM

## 2022-08-19 NOTE — Progress Notes (Signed)
[place], [place] 08/19/22  Subjective:   Hospital day # 5  Patient known to our practice from outpatient follow-up of CKD, with Dr Candiss Norse.  Patient seen sitting up in bed, resting comfortably Wife at bedside Wife states patient rested better overnight, had hiccups all night      10/11 0701 - 10/12 0700 In: 3171.9 [P.O.:660; I.V.:1986.9; IV Piggyback:525] Out: 775 [Urine:775] Lab Results  Component Value Date   CREATININE 2.05 (H) 08/18/2022   CREATININE 2.06 (H) 08/17/2022   CREATININE 2.10 (H) 08/16/2022     Objective:  Vital signs in last 24 hours:  Temp:  [98.1 F (36.7 C)-99.1 F (37.3 C)] 98.7 F (37.1 C) (10/12 1330) Pulse Rate:  [70-98] 82 (10/12 1330) Resp:  [13-30] 16 (10/12 1330) BP: (119-170)/(63-105) 160/86 (10/12 1330) SpO2:  [96 %-100 %] 96 % (10/12 1330) Weight:  [93.4 kg] 93.4 kg (10/12 0748)  Weight change:  Filed Weights   08/16/22 0226 08/19/22 0512 08/19/22 0748  Weight: 86.1 kg 93.4 kg 93.4 kg    Intake/Output:    Intake/Output Summary (Last 24 hours) at 08/19/2022 1347 Last data filed at 08/19/2022 0800 Gross per 24 hour  Intake 2313.86 ml  Output 550 ml  Net 1763.86 ml      Physical Exam: General: elderly gentleman, laying in the bed  HEENT Anicteric, moist oral mucous membranes  Pulm/lungs Normal breathing effort, clear to auscultation  CVS/Heart No rub or gallop  Abdomen:  Soft, nontender  Extremities: No peripheral edema  Neurologic: Alert, oriented, pain with movement of right leg  Skin: No acute rashes          Basic Metabolic Panel:  Recent Labs  Lab 08/14/22 0247 08/15/22 0440 08/16/22 0403 08/17/22 0530 08/18/22 0406  NA 137 137 138 138 141  K 4.6 4.3 4.5 3.9 3.9  CL 103 108 109 109 109  CO2 24 24 22 22  20*  GLUCOSE 104* 104* 106* 88 115*  BUN 31* 32* 30* 28* 30*  CREATININE 2.69* 2.33* 2.10* 2.06* 2.05*  CALCIUM 9.1 8.7* 8.6* 8.4* 8.8*      CBC: Recent Labs  Lab  08/14/22 0247 08/15/22 0440 08/16/22 0403 08/17/22 0530 08/18/22 0406  WBC 3.8* 4.4 5.4 5.5 7.2  HGB 8.6* 7.8* 7.9* 8.0* 9.4*  HCT 28.6* 25.5* 25.4* 26.1* 30.0*  MCV 92.9 90.7 91.0 91.3 88.8  PLT 136* 149* 155 136* 193      No results found for: "HEPBSAG", "HEPBSAB", "HEPBIGM"    Microbiology:  Recent Results (from the past 240 hour(s))  Culture, blood (routine x 2)     Status: Abnormal   Collection Time: 08/14/22  3:57 AM   Specimen: BLOOD  Result Value Ref Range Status   Specimen Description   Final    BLOOD BLOOD RIGHT ARM Performed at [facility], [address] [place], [place] [address]    Special Requests   Final    BOTTLES DRAWN AEROBIC AND ANAEROBIC Blood Culture adequate volume Performed at [facility], [address] [place], [place] [address]    Culture  Setup Time   Final    Organism ID to follow [place] TO, READ BACK BY AND VERIFIED WITH: PHARMD CHILDS AT [place] 08/14/2022 GAA Performed at [facility], [address] [place], [place] [address]    Culture ENTEROCOCCUS FAECALIS (A)  Final   Report Status 08/17/2022 FINAL  Final  Organism ID, Bacteria ENTEROCOCCUS FAECALIS  Final      Susceptibility   Enterococcus faecalis - MIC*    AMPICILLIN <=2 SENSITIVE Sensitive     VANCOMYCIN 1 SENSITIVE Sensitive     GENTAMICIN SYNERGY SENSITIVE Sensitive     * ENTEROCOCCUS FAECALIS  Culture, blood (routine x 2)     Status: Abnormal   Collection Time: 08/14/22  3:57 AM   Specimen: BLOOD  Result Value Ref Range Status   Specimen Description   Final    BLOOD BLOOD LEFT ARM Performed at [facility], [address], [place], [place] [address]    Special Requests   Final    BOTTLES DRAWN AEROBIC AND ANAEROBIC Blood Culture adequate volume Performed at [facility], [address] [place], [place] [address]    Culture  Setup Time    Final    GRAM POSITIVE COCCI AEROBIC BOTTLE ONLY CRITICAL RESULT CALLED TO, READ BACK BY AND VERIFIED WITH: PHARMD CHILDS AT [place] 08/14/2022 GAA GRAM STAIN REVIEWED-AGREE WITH RESULT Performed at [facility], [address] [place], [place] [address]    Culture (A)  Final    ENTEROCOCCUS FAECALIS SUSCEPTIBILITIES PERFORMED ON PREVIOUS CULTURE WITHIN THE LAST 5 DAYS. Performed at [facility], [place] [address], [place],  [address]    Report Status 08/17/2022 FINAL  Final  Resp Panel by RT-PCR (Flu A&B, Covid) Anterior Nasal Swab     Status: None   Collection Time: 08/14/22  3:57 AM   Specimen: Anterior Nasal Swab  Result Value Ref Range Status   SARS Coronavirus 2 by RT PCR NEGATIVE NEGATIVE Final    Comment: (NOTE) SARS-CoV-2 target nucleic acids are NOT DETECTED.  The SARS-CoV-2 RNA is generally detectable in upper respiratory specimens during the acute phase of infection. The lowest concentration of SARS-CoV-2 viral copies this assay can detect is 138 copies/mL. A negative result does not preclude SARS-Cov-2 infection and should not be used as the sole basis for treatment or other patient management decisions. A negative result may occur with  improper specimen collection/handling, submission of specimen other than nasopharyngeal swab, presence of viral mutation(s) within the areas targeted by this assay, and inadequate number of viral copies(<138 copies/mL). A negative result must be combined with clinical observations, patient history, and epidemiological information. The expected result is Negative.  Fact Sheet for Patients:  EntrepreneurPulse.com.au  Fact Sheet for Healthcare Providers:  IncredibleEmployment.be  This test is no t yet approved or cleared by the [place] FDA and  has been authorized for detection and/or diagnosis of SARS-CoV-2 by FDA under an Emergency Use Authorization (EUA). This EUA will  remain  in effect (meaning this test can be used) for the duration of the COVID-19 declaration under Section 564(b)(1) of the Act, 21 U.S.C.section 360bbb-3(b)(1), unless the authorization is terminated  or revoked sooner.       Influenza A by PCR NEGATIVE NEGATIVE Final   Influenza B by PCR NEGATIVE NEGATIVE Final    Comment: (NOTE) The Xpert Xpress SARS-CoV-2/FLU/RSV plus assay is intended as an aid in the diagnosis of influenza from Nasopharyngeal swab specimens and should not be used as a sole basis for treatment. Nasal washings and aspirates are unacceptable for Xpert Xpress SARS-CoV-2/FLU/RSV testing.  Fact Sheet for Patients: EntrepreneurPulse.com.au  Fact Sheet for Healthcare Providers: IncredibleEmployment.be  This test is not yet approved or cleared by the [place] FDA and has been authorized for detection and/or diagnosis of SARS-CoV-2 by FDA under  an Emergency Use Authorization (EUA). This EUA will remain in effect (meaning this test can be used) for the duration of the COVID-19 declaration under Section 564(b)(1) of the Act, 21 U.S.C. section 360bbb-3(b)(1), unless the authorization is terminated or revoked.  Performed at [facility], [address] [place], [place] [address]   Blood Culture ID Panel (Reflexed)     Status: Abnormal   Collection Time: 08/14/22  3:57 AM  Result Value Ref Range Status   Enterococcus faecalis DETECTED (A) NOT DETECTED Final    Comment: CRITICAL RESULT CALLED TO, READ BACK BY AND VERIFIED WITH: PHARMD CHILDS AT 1957 08/14/2022 GAA    Enterococcus Faecium NOT DETECTED NOT DETECTED Final   Listeria monocytogenes NOT DETECTED NOT DETECTED Final   Staphylococcus species NOT DETECTED NOT DETECTED Final   Staphylococcus aureus (BCID) NOT DETECTED NOT DETECTED Final   Staphylococcus epidermidis NOT DETECTED NOT DETECTED Final   Staphylococcus lugdunensis NOT DETECTED NOT DETECTED Final    Streptococcus species NOT DETECTED NOT DETECTED Final   Streptococcus agalactiae NOT DETECTED NOT DETECTED Final   Streptococcus pneumoniae NOT DETECTED NOT DETECTED Final   Streptococcus pyogenes NOT DETECTED NOT DETECTED Final   A.calcoaceticus-baumannii NOT DETECTED NOT DETECTED Final   Bacteroides fragilis NOT DETECTED NOT DETECTED Final   Enterobacterales NOT DETECTED NOT DETECTED Final   Enterobacter cloacae complex NOT DETECTED NOT DETECTED Final   Escherichia coli NOT DETECTED NOT DETECTED Final   Klebsiella aerogenes NOT DETECTED NOT DETECTED Final   Klebsiella oxytoca NOT DETECTED NOT DETECTED Final   Klebsiella pneumoniae NOT DETECTED NOT DETECTED Final   Proteus species NOT DETECTED NOT DETECTED Final   Salmonella species NOT DETECTED NOT DETECTED Final   Serratia marcescens NOT DETECTED NOT DETECTED Final   Haemophilus influenzae NOT DETECTED NOT DETECTED Final   Neisseria meningitidis NOT DETECTED NOT DETECTED Final   Pseudomonas aeruginosa NOT DETECTED NOT DETECTED Final   Stenotrophomonas maltophilia NOT DETECTED NOT DETECTED Final   Candida albicans NOT DETECTED NOT DETECTED Final   Candida auris NOT DETECTED NOT DETECTED Final   Candida glabrata NOT DETECTED NOT DETECTED Final   Candida krusei NOT DETECTED NOT DETECTED Final   Candida parapsilosis NOT DETECTED NOT DETECTED Final   Candida tropicalis NOT DETECTED NOT DETECTED Final   Cryptococcus neoformans/gattii NOT DETECTED NOT DETECTED Final   Vancomycin resistance NOT DETECTED NOT DETECTED Final    Comment: Performed at [facility], [address] [place], [place] [address]  Urine Culture     Status: Abnormal   Collection Time: 08/14/22  6:54 AM   Specimen: Urine, Clean Catch  Result Value Ref Range Status   Specimen Description   Final    URINE, CLEAN CATCH Performed at [facility], [address], [place], [place] [address]    Special Requests   Final    NONE Performed at  [facility], [address] [place], [place] [address]    Culture >=100,000 COLONIES/mL ENTEROCOCCUS FAECALIS (A)  Final   Report Status 08/16/2022 FINAL  Final   Organism ID, Bacteria ENTEROCOCCUS FAECALIS (A)  Final      Susceptibility   Enterococcus faecalis - MIC*    AMPICILLIN <=2 SENSITIVE Sensitive     NITROFURANTOIN <=16 SENSITIVE Sensitive     VANCOMYCIN 1 SENSITIVE Sensitive     * >=100,000 COLONIES/mL ENTEROCOCCUS FAECALIS  Culture, blood (Routine X 2) w Reflex to ID Panel     Status: None (Preliminary result)   Collection Time:  08/15/22 12:22 PM   Specimen: Right Antecubital; Blood  Result Value Ref Range Status   Specimen Description RIGHT ANTECUBITAL  Final   Special Requests   Final    BOTTLES DRAWN AEROBIC AND ANAEROBIC Blood Culture adequate volume   Culture   Final    NO GROWTH 4 DAYS Performed at [facility], [address], [place], [place] [address]    Report Status PENDING  Incomplete  Culture, blood (Routine X 2) w Reflex to ID Panel     Status: None (Preliminary result)   Collection Time: 08/15/22 12:22 PM   Specimen: BLOOD RIGHT ARM  Result Value Ref Range Status   Specimen Description BLOOD RIGHT ARM  Final   Special Requests   Final    BOTTLES DRAWN AEROBIC AND ANAEROBIC Blood Culture adequate volume   Culture   Final    NO GROWTH 4 DAYS Performed at [facility], [address], [place], [place] [address]    Report Status PENDING  Incomplete    Coagulation Studies: Recent Labs    08/17/22 1314  LABPROT 14.9  INR 1.2     Urinalysis: No results for input(s): "COLORURINE", "LABSPEC", "PHURINE", "GLUCOSEU", "HGBUR", "BILIRUBINUR", "KETONESUR", "PROTEINUR", "UROBILINOGEN", "NITRITE", "LEUKOCYTESUR" in the last 72 hours.  Invalid input(s): "APPERANCEUR"     Imaging: CT LUMBAR SPINE W CONTRAST  Result Date: 08/17/2022 CLINICAL DATA:  Low back pain EXAM: CT LUMBAR SPINE WITH CONTRAST TECHNIQUE: Multidetector  CT imaging of the lumbar spine was performed with intravenous contrast administration. RADIATION DOSE REDUCTION: This exam was performed according to the departmental dose-optimization program which includes automated exposure control, adjustment of the mA and/or kV according to patient size and/or use of iterative reconstruction technique. CONTRAST:  90mL OMNIPAQUE IOHEXOL 350 MG/ML SOLN COMPARISON:  11/16/2016 CT lumbar spine FINDINGS: Segmentation: 5 lumbar-type vertebral bodies. Alignment: 3 mm retrolisthesis of L1 on L2 and L2 on L3, unchanged. 12 mm anterolisthesis L5 on S1 (grade 2), unchanged. Dextrocurvature of the lumbar spine with compensatory levocurvature of the lower lumbar spine. 6 mm left lateral listhesis of L1 on L2 4 mm right lateral listhesis of L2 on L3. 6 mm right lateral listhesis of L3 on L4. Lateral listhesis also appear unchanged compared to prior exam. Vertebrae: No acute fracture or suspicious osseous lesion. Bilateral L5 pars defects. Status post L5-S1 posterior fusion with evidence of osseous fusion across the disc space. Endplate degenerative changes at L2-L3, eccentric to the left, and L4-L5, eccentric to the right. Paraspinal and other soft tissues: Aortic aneurysm, status post EVAR, which is better evaluated on the 08/14/2022 CTA abdomen pelvis. Redemonstrated left renal cyst, for which no follow-up is currently indicated. Nonobstructing left renal calculus. Disc levels: T12-L1: Left eccentric disc bulge. Mild facet arthropathy. No spinal canal stenosis or significant neural foraminal narrowing. L1-L2: Mild retrolisthesis with mild disc bulge. Mild facet arthropathy. No spinal canal stenosis or neural foraminal narrowing. L2-L3: Mild retrolisthesis and mild disc bulge. Mild facet arthropathy. Narrowing of the lateral recesses. No spinal canal stenosis or neural foraminal narrowing. L3-L4: Mild disc bulge. Mild facet arthropathy. No spinal canal stenosis or neural foraminal  narrowing. L4-L5: Right-greater-than-left disc height loss and mild disc bulge. Moderate facet arthropathy. No spinal canal stenosis. Mild bilateral neural foraminal narrowing. L5-S1: Redemonstrated bilateral pars defect with grade 2 anterolisthesis. No spinal canal stenosis. Moderate bilateral neural foraminal narrowing. IMPRESSION: 1. Status post L5-S1 posterior fusion, with evidence of osseous fusion across the disc space. 2. 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 3. Multilevel degenerative disc disease, worst at L4-L5 and L5-S1, with mild bilateral neural foraminal narrowing at L4-L5 and moderate bilateral neural foraminal narrowing at L5-S1. 4. No spinal canal stenosis. 5. No acute fracture or traumatic listhesis. Electronically Signed   By: Merilyn Baba M.D.   On: 08/17/2022 18:25   CT Angio Chest Pulmonary Embolism (PE) W or WO Contrast  Result Date: 08/17/2022 CLINICAL DATA:  Known pulmonary embolism, follow-up EXAM: CT ANGIOGRAPHY CHEST WITH CONTRAST TECHNIQUE: Multidetector CT imaging of the chest was performed using the standard protocol during bolus administration of intravenous contrast. Multiplanar CT image reconstructions and MIPs were obtained to evaluate the vascular anatomy. RADIATION DOSE REDUCTION: This exam was performed according to the departmental dose-optimization program which includes automated exposure control, adjustment of the mA and/or kV according to patient size and/or use of iterative reconstruction technique. CONTRAST:  66mL OMNIPAQUE IOHEXOL 350 MG/ML SOLN COMPARISON:  08/14/2022 FINDINGS: Cardiovascular: Extensive atherosclerotic calcifications aorta, proximal great vessels, and coronary arteries. 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 Enlargement of cardiac chambers. Pacemaker leads LEFT ventricle and RIGHT atrial appendage. No pericardial effusion. Pulmonary arteries adequately opacified and patent. No  pulmonary emboli identified. Specifically, pulmonary embolus in LEFT upper lobe pulmonary arterial branch on previous exam no longer seen. Mediastinum/Nodes: Esophagus unremarkable. Base of cervical region normal appearance. Beam hardening artifacts from shoulder prostheses traverse chest. Significant mucus within RIGHT mainstem bronchus extending into bronchus intermedius and RIGHT lower lobe. No adenopathy. Lungs/Pleura: BILATERAL pleural effusions. Emphysematous changes. Mild compressive atelectasis RIGHT lower lobe. No infiltrate or pneumothorax. Upper Abdomen: No significant upper abdominal findings. Musculoskeletal: Osseous structures unremarkable. Review of the MIP images confirms the above findings. IMPRESSION: No evidence of pulmonary embolism; resolution of small pulmonary embolus in a LEFT upper lobe pulmonary arterial branch seen on previous study. Significant mucus within RIGHT mainstem bronchus extending into bronchus intermedius and RIGHT lower lobe. BILATERAL pleural effusions and compressive atelectasis RIGHT lower lobe. Extensive atherosclerotic calcifications including coronary arteries. 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 This recommendation follows 2010 ACCF/AHA/AATS/ACR/ASA/SCA/SCAI/SIR/STS/SVM Guidelines for the Diagnosis and Management of Patients with Thoracic Aortic Disease. Circulation. 2010; 121: X412-I786. Aortic aneurysm NOS (ICD10-I71.9) Aortic Atherosclerosis (ICD10-I70.0). Aortic aneurysm NOS (ICD10-I71.9). Emphysema (ICD10-J43.9). Electronically Signed   By: Lavonia Dana M.D.   On: 08/17/2022 17:18     Medications:    sodium chloride 100 mL/hr at 08/19/22 1242   ampicillin (OMNIPEN) IV     cefTRIAXone (ROCEPHIN)  IV 2 g (08/19/22 1039)    atorvastatin  40 mg Oral QHS   butamben-tetracaine-benzocaine       calcitRIOL  0.25 mcg Oral q AM   clopidogrel  75 mg Oral Daily   donepezil  10 mg Oral  QHS   ezetimibe  10 mg Oral QPM   fentaNYL       ferrous sulfate  325 mg Oral Q2000   heparin injection (subcutaneous)  5,000 Units Subcutaneous Q8H   ipratropium  2-4 spray Each Nare QHS   lidocaine  1 patch Transdermal Q24H   lidocaine       loratadine  10 mg Oral Daily   melatonin  10 mg Oral QHS   methocarbamol  500 mg Oral Q2000   metoprolol succinate  25 mg Oral Daily   midazolam       multivitamin with minerals  1 tablet Oral QPC lunch   oxyCODONE-acetaminophen  2 tablet Oral Q6H   pantoprazole  40 mg Oral QAC breakfast   polyethylene glycol  17 g Oral BID   predniSONE  40 mg Oral Q breakfast   senna-docusate  2 tablet Oral BID   zolpidem  10 mg Oral QHS   albuterol, ALPRAZolam, butamben-tetracaine-benzocaine, dextromethorphan-guaiFENesin, fentaNYL, hydrALAZINE, HYDROmorphone (DILAUDID) injection, lidocaine, methocarbamol, midazolam, ondansetron (ZOFRAN) IV  Assessment/ Plan:  81 y.o. 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  Urinalysis shows small proteinuria.  Concern about IV contrast exposure causing worsening of kidney function.  Overall this is a difficult situation.  Patient does require proper assessment of aorta (which necessitates IV contrast exposure) prior to planning endovascular repair. 2D echo from 06/14/2022 shows LVEF 60 to 26%, grade 1 diastolic  dysfunction  Plan: Renal function remains stable. No adverse effects have arose from contrast exposure on 08/17/22. TEE negative for endocarditis. Will continue current treatments for bacteremia.     LOS: [place] 10/12/20231:47 PM  [place], [place]  Note: This note was prepared with Dragon dictation. Any transcription errors are unintentional

## 2022-08-19 NOTE — Consult Note (Signed)
Consultation Note Date: 08/19/2022   Patient Name: Rick Mcbride.  DOB: Aug 31, 1941  MRN: [id number]  Age / Sex: 81 y.o., male  PCP: Idelle Crouch, MD Referring Physician: Nolberto Hanlon, MD  Reason for Consultation: Establishing goals of care  HPI/Patient Profile: 81 y.o. 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   Clinical Assessment and Goals of Care: I have reviewed medical records including EPIC notes, labs and imaging, received report from RN, assessed the patient.  Rick Mcbride is lying quietly in bed.  He appears acutely/chronically ill and quite frail.  He is able to tell me his name and where we are, but not the month.  I believe that he can make his basic needs known.  His wife of 56+ years, Rick Mcbride, is present at bedside along with nursing staff attending to needs.  We meet at the bedside to discuss diagnosis prognosis, GOC, EOL wishes, disposition and options.  I introduced Palliative Medicine as specialized medical care for people living with serious illness. It focuses on providing relief from the symptoms and stress of a serious illness. The goal is to improve quality of life for both the patient and the family.  We discussed a brief life review of the patient.  Mr. and Mrs. Mcbride have been  married for 57+ years.  They have 2 children, Rick Rick Mcbride who is a Equities trader and son Rick Mcbride who is developmentally delayed and lives in a group home.  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  Mrs. Mostafa shares that she has had to assist her husband with bathing and dressing since his hip fracture repair in August, even after PT and rehab and at home.  We then focused on their current illness.  We talk about Rick Mcbride acute and chronic health concerns including, but not limited to, recent hip fracture and poor mobility, memory loss, bacteremia and the treatment plan, multiple bouts of cancer, and the recommendations of selected specialist including, but not limited to, cardiology and ID.  The natural disease trajectory and expectations at EOL were discussed.  Advanced directives, concepts specific to code status, artifical feeding and hydration, and rehospitalization were considered and discussed.  Mrs. Mcbride readily states that she and her husband have talked about end-of-life issues for many years and neither would want his to be prolonged artificially.  Hospice and Palliative Care services outpatient were explained and offered.  Mrs. Mcbride shares that her mother had residential hospice care.  She tells me that she  is considering residential hospice care for Rick Mcbride.  She shares that he suffers greatly, is constantly in pain.  We talk about going to hospice home to "let nature take its course", no further treatments or antibiotics.  Mrs. Poyser states she understands.  Mrs. Yearwood states that she would want her husband's input before making a decision for comfort care.  She tells me that her Rick Rick Mcbride and son-in-law are arriving tomorrow.  She states that they will have a family discussion about what is next.  Discussed the importance of continued conversation with family and the medical providers regarding overall plan of care and treatment options,  ensuring decisions are within the context of the patient's values and GOCs. Questions and concerns were addressed.  The family was encouraged to call with questions or concerns.  PMT will continue to support holistically.  Conference with attending, bedside nursing staff, transition of care team related to patient condition, needs, goals of care, disposition.    HCPOA HCPOA -wife 27 years, Rick Mcbride.    SUMMARY OF RECOMMENDATIONS   At this point continue to treat the treatable but no CPR or intubation Rick Sharyn Lull, RN, is arriving tomorrow for family discussion Considering residential hospice   Code Status/Advance Care Planning: DNR -verified with wife  Symptom Management:  Per hospitalist, no additional needs at this time.  Palliative Prophylaxis:  Frequent Pain Assessment, Oral Care, and Turn Reposition  Additional Recommendations (Limitations, Scope, Preferences): Continue to treat the treatable but no CPR or intubation  Psycho-social/Spiritual:  Desire for further Chaplaincy support:no Additional Recommendations: Caregiving  Support/Resources  Prognosis:  Unable to determine, based on outcomes.  Guarded at this point.  6 months or less would not be surprising based on chronic illness burden, decreasing functional status, multiple acute illnesses in the last few months.  If family were to focus on comfort and dignity, elect residential hospice, prognosis would be 2 weeks or less.  Discharge Planning: To be determined, based on outcomes and family choice.      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 The following aspects are pertinent.  Past Medical History:  Diagnosis Date   AAA (abdominal aortic aneurysm) ([place])    a.) s/p EVAR 01/05/2017. b.) native aneurysm sac 6.6 x 6.9 cm by CT on 03/31/2021   Abnormality of tongue    a.) CT head/neck 07/10/2021 --> asymmetric soft tissue at the RIGHT tongue base with a superficial 8 mm lesion.   Anemia    Aneurysm of right common iliac artery (HCC)    a.) measured 2.7 cm by CT on 03/31/2021   Anxiety    Aortic atherosclerosis (HCC)    Atrophic kidney    B12 deficiency    CAD (coronary artery disease)    Cervical radiculopathy    Chronic airway obstruction (HCC)    Chronic kidney disease (CKD), stage III (moderate) (HCC)    Chronic pain  syndrome 06/16/2021   Chronic right shoulder pain 11/12/2019   Chronic tension headaches    Chronic, continuous use of opioids 06/16/2021   Coronary artery disease    DDD (degenerative disc disease), lumbar    Degenerative disc disease, lumbar    with lumbar radiculopathy   Elbow fracture, left    GERD (gastroesophageal reflux disease)    H/O adenomatous polyp of colon    H/O hemorrhoids    HTN (hypertension)    Hyperlipidemia    Meralgia paresthetica    Mild cognitive impairment    Nephrolithiasis    Neuralgia    Numbness of right foot 02/19/2021   Osteoarthritis    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   Presence of permanent cardiac pacemaker    Prostate cancer [facility])    a.) s/p prostatectomy   S/P CABG x 3 05/01/2004   a.) LVEF 40-49%; LIMA-LAD, SVG-OM1, SVG-PDA   Second degree AV block    Sinoatrial node dysfunction (HCC)    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[place]) 01/31/2013   a.) RLL  squamous cell carcinoma   Status post partial lobectomy of lung    a.) s/p RLL resection on 01/19/2013   Stroke [facility])    TIA (transient ischemic attack)    Valvular regurgitation    a.) TTE 10/24/2019 --> LVEF 45-50%; trivial TR, mild AR and MR; moderate LA dilitation.   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   Patient is right handed.      Live at home with wife   Social Determinants of Health   Financial Resource Strain: Not on file  Food Insecurity: No Food Insecurity (08/14/2022)   Hunger Vital Sign    Worried About Running Out of Food in the Last Year: Never true    Ran Out of Food in the Last Year: Never true  Transportation Needs: No Transportation Needs (08/14/2022)   PRAPARE - Hydrologist (Medical): No    Lack of Transportation (Non-Medical): No  Physical Activity: Not on file  Stress: Not on file  Social Connections: Not on file   Family History  Problem Relation Age of Onset   Heart attack Mother    Heart attack Father    Breast cancer Sister    Asthma Sister    Scheduled Meds:  atorvastatin  40 mg Oral QHS   butamben-tetracaine-benzocaine       calcitRIOL  0.25 mcg Oral q AM   clopidogrel  75 mg Oral Daily   donepezil  10 mg Oral QHS   ezetimibe  10 mg Oral QPM  fentaNYL       ferrous sulfate  325 mg Oral Q2000   heparin injection (subcutaneous)  5,000 Units Subcutaneous Q8H   ipratropium  2-4 spray Each Nare QHS   lidocaine  1 patch Transdermal Q24H   lidocaine       loratadine  10 mg Oral Daily   melatonin  10 mg Oral QHS   methocarbamol  500 mg Oral Q2000   metoprolol succinate  25 mg Oral Daily   midazolam       multivitamin with minerals  1 tablet Oral QPC lunch   oxyCODONE-acetaminophen  2 tablet Oral Q6H   pantoprazole  40 mg Oral QAC breakfast   polyethylene glycol  17 g Oral BID   predniSONE  40 mg Oral Q breakfast   senna-docusate  2 tablet Oral BID   zolpidem  10 mg Oral QHS   Continuous Infusions:  sodium chloride 100 mL/hr at 08/19/22 1242   ampicillin (OMNIPEN) IV     cefTRIAXone (ROCEPHIN)  IV 2 g (08/19/22 1039)   PRN Meds:.albuterol, ALPRAZolam, butamben-tetracaine-benzocaine, dextromethorphan-guaiFENesin, fentaNYL, hydrALAZINE, HYDROmorphone (DILAUDID) injection, lidocaine, methocarbamol, midazolam, ondansetron (ZOFRAN) IV Medications Prior to Admission:  Prior to Admission medications   Medication Sig Start Date End Date Taking? Authorizing Provider  acetaminophen (TYLENOL) 500 MG tablet Take 500 mg by mouth every 4 (four) hours as needed for moderate pain.   Yes [provider]  aspirin EC 81 MG tablet Take 81 mg by mouth daily.    Yes [provider]  atorvastatin (LIPITOR) 20 MG tablet Take 40 mg by mouth at bedtime.   Yes [provider]  calcitRIOL (ROCALTROL) 0.25 MCG capsule Take 0.25 mcg by mouth in the morning.   Yes [provider]  Calcium Carb-Cholecalciferol (CALCIUM 600 + D PO) Take 1 tablet by mouth daily after lunch.   Yes [provider]  cetirizine (ZYRTEC) 10 MG tablet Take 10 mg by mouth at bedtime.   Yes [provider]  clopidogrel (PLAVIX) 75 MG tablet Take 1 tablet (75 mg total) by mouth daily. Restart Plavix after completion of Eliquis  prescription. 06/12/22  Yes Lattie Corns, PA-C  docusate sodium (COLACE) 100 MG capsule Take 300 mg by mouth daily after lunch.   Yes [provider]  donepezil (ARICEPT) 10 MG tablet Take 10 mg by mouth at bedtime. 05/12/22  Yes [provider]  ezetimibe (ZETIA) 10 MG tablet Take 10 mg by mouth every evening.   Yes [provider]  Ferrous Sulfate (IRON SLOW RELEASE) 140 (45 Fe) MG TBCR Take 1 tablet by mouth 2 (two) times daily.   Yes [provider]  ipratropium (ATROVENT) 0.06 % nasal spray Place 2-4 sprays into both nostrils at bedtime.   Yes [provider]  loratadine (CLARITIN) 10 MG tablet Take 10 mg by mouth in the morning.   Yes [provider]  losartan (COZAAR) 100 MG tablet Take 100 mg by mouth in the morning.   Yes [provider]  melatonin 5 MG TABS Take 10 mg by mouth at bedtime.   Yes [provider]  methocarbamol (ROBAXIN) 500 MG tablet Take 1 tablet (500 mg total) by mouth at bedtime as needed for muscle spasms. Patient taking differently: Take 500 mg by mouth every evening. 06/15/22  Yes Annita Brod, MD  metoprolol succinate (TOPROL-XL) 25 MG 24 hr tablet Take 25 mg by mouth daily. 05/24/17  Yes [provider]  Multiple Vitamin (MULTIVITAMIN WITH  MINERALS) TABS tablet Take 1 tablet by mouth daily after lunch.   Yes [provider]  Multiple Vitamins-Minerals (PRESERVISION AREDS 2) CAPS Take 1 tablet by mouth 2 (two) times daily.   Yes [provider]  pantoprazole (PROTONIX) 40 MG tablet Take 40 mg by mouth every evening.   Yes [provider]  Probiotic Product (PROBIOTIC PO) Take 1 capsule by mouth in the morning.   Yes [provider]  traMADol (ULTRAM) 50 MG tablet Take 1 tablet (50 mg total) by mouth every 6 (six) hours as needed for moderate pain. Patient taking differently: Take 50 mg by mouth 4 (four) times daily. 06/14/22  Yes Lattie Corns, PA-C  zolpidem (AMBIEN) 10 MG tablet Take 1 tablet (10 mg total) by mouth at bedtime. 06/15/22  Yes Annita Brod, MD  apixaban (ELIQUIS) 2.5 MG TABS tablet Take 1 tablet (2.5 mg total) by mouth 2 (two) times daily. Patient not taking: Reported on 08/14/2022 06/12/22   Lattie Corns, PA-C  feeding supplement (ENSURE ENLIVE / ENSURE PLUS) LIQD Take 237 mLs by mouth 2 (two) times daily between meals. 06/15/22   Annita Brod, MD   No Known Allergies Review of Systems  Unable to perform ROS: Age    Physical Exam Vitals and nursing note reviewed.  Constitutional:      General: He is not in acute distress.    Appearance: He is ill-appearing.  Cardiovascular:     Rate and Rhythm: Normal rate.  Pulmonary:     Effort: Pulmonary effort is normal. No respiratory distress.  Skin:    General: Skin is warm and dry.     Coloration: Skin is pale.  Neurological:     Mental Status: He is alert.     Comments: Oriented to person and place, not month, known memory loss  Psychiatric:        Mood and Affect: Mood normal.        Behavior: Behavior normal.     Comments: Calm and cooperative, not fearful     Vital Signs: BP (!) 162/81   Pulse 77   Temp 98.8 F (37.1 C) (Oral)   Resp (!) 22   Ht 5\' 11"  (1.803 m)   Wt 93.4 kg   SpO2 98%   BMI 28.72 kg/m  Pain Scale: 0-10 POSS *See Group Information*: 1-Acceptable,Awake and alert Pain Score: 8    SpO2: SpO2: 98 % O2 Device:SpO2: 98 % O2 Flow Rate: .O2 Flow Rate (L/min): 4 L/min  IO: Intake/output summary:  Intake/Output Summary (Last 24 hours) at 08/19/2022 1251 Last data filed at 08/19/2022 0200 Gross per 24 hour  Intake 2313.86 ml  Output 550 ml  Net 1763.86 ml    LBM: Last BM Date : 08/14/22 Baseline Weight: Weight: 99.8 kg Most recent weight: Weight: 93.4 kg     Palliative Assessment/Data:   Flowsheet Rows    Flowsheet Row Most Recent Value  Intake Tab   Referral Department Hospitalist  Unit at Time of  Referral Cardiac/Telemetry Unit  Palliative Care Primary Diagnosis Cardiac  Date Notified 08/18/22  Palliative Care Type New Palliative care  Reason for referral Clarify Goals of Care  Date of Admission 08/14/22  Date first seen by Palliative Care 08/19/22  # of days Palliative referral response time 1 Day(s)  # of days IP prior to Palliative referral 4  Clinical Assessment   Palliative Performance Scale Score 40%  Pain Max last 24 hours Not able to report  Pain Min Last 24 hours Not able to report  Dyspnea Max Last 24 Hours Not able to report  Dyspnea Min Last 24 hours Not able to report  Psychosocial & Spiritual Assessment   Palliative Care Outcomes        Time In: 0945 Time Out: 1100 Time Total: 75 minutes  Greater than 50%  of this time was spent counseling and coordinating care related to the above assessment and plan.  Signed by: Drue Novel, NP   Please contact Palliative Medicine Team phone at [phone] for questions and concerns.  For individual provider: See Shea Evans

## 2022-08-19 NOTE — CV Procedure (Signed)
Transesophageal echocardiogram preliminary report  Rick Mcbride [id number] [birth year]  Preliminary diagnosis  Bacteremia with possible endocarditis  Postprocedural diagnosis Normal LV function without evidence of endocarditis or vegetation on valves or pacer leads But patient has migration of pacer lead into LV Time out A timeout was performed by the nursing staff and physicians specifically identifying the procedure performed, identification of the patient, the type of sedation, all allergies and medications, all pertinent medical history, and presedation assessment of nasopharynx. The patient and or family understand the risks of the procedure including the rare risks of death, stroke, heart attack, esophogeal perforation, sore throat, and reaction to medications given.  Moderate sedation During this procedure the patient has received Versed 2 milligrams and fentanyl 25 micrograms to achieve appropriate moderate sedation.  The patient had continued monitoring of heart rate, oxygenation, blood pressure, respiratory rate, and extent of signs of sedation throughout the entire procedure.  The patient received this moderate sedation over a period of 18 minutes.  Both the nursing staff and I were present during the procedure when the patient had moderate sedation for 100% of the time.  Treatment considerations  No additional treatment considerations needed for bacteremia due to no current evidence of endocarditis  For further details of transesophageal echocardiogram please refer to final report.  Signed,  Corey Skains M.D. [facility] 08/19/2022 8:23 AM

## 2022-08-19 NOTE — Progress Notes (Addendum)
TOC received consult stating patient's wife uncertain of dispo and cannot care for patient at home, CSW consulted with MD who reports patient's daughter to arrive this weekend for [place] discussion  LVM with patient's spouse to discuss current plan.   TOC will follow for decision following GOC discussion with daughter this weekend.   [place], [place]

## 2022-08-19 NOTE — Progress Notes (Signed)
*  PRELIMINARY RESULTS* Echocardiogram Echocardiogram Transesophageal has been performed.  Rick Mcbride 08/19/2022, 8:30 AM

## 2022-08-19 NOTE — Progress Notes (Addendum)
Patient was seen evaluated and examined by me and the PA on 08/19/22.  Course of action, evaluation, and management decisions were developed solely by me, but detailed below in the PA's note.  [facility] Patient ID: [name] MRN: [id number] DOB/AGE: 1940-11-15 81 y.o.   Admit date: 08/14/2022 Referring Physician Blaine Hamper Primary Physician [facility] Primary Cardiologist Nehemiah Massed Reason for Consultation preoperative cardiovascular evaluation   HPI: 81 year old gentleman referred for preoperative cardiovascular evaluation prior to abdominal aortic aneurysm repair.  Patient brought to [facility] ED via EMS with chief complaint of lower back pain and generalized weakness.  ECG revealed atrial sensing with ventricular pacing.  Admission labs notable for borderline elevated high-sensitivity troponin (31, 30) in the absence of chest pain, likely elevated due to underlying chronic kidney disease.  Abdominal CT was performed which revealed abdominal aortic aneurysm 7.6 x 7.6 cm which is increased compared to most recent study showing 6.9 x 7.0 cm.  The patient evaluated by vascular surgery who is recommending surgical repair. S/p coil embolization of vessels creating type II endoleak with vascular surgery on 10/9 which went well. He continues to have intermittent spasms of severe back pain post procedureally. Hospital course complicated by E. Faecalis bacteremia.    The patient has known coronary disease, status post CABG with LIMA to LAD, SVG to OM1 and PDA performed 2005 at [facility].  The patient has dual-chamber pacemaker for second-degree AV block.  Patient has known chronic kidney disease.  He has prior history of CVA/TIA.  Most recent 2D echocardiogram 06/14/2022 revealed LVEF 60-65 %.  Lexiscan Myoview 10/24/2019 did not reveal evidence for scar or ischemia.  The patient currently denies chest pain.  He does report intermittent shortness of breath which is felt to be due to possible bronchitis.  Chest x-ray  reveals possible right lower lobe pneumonia.  Interval history: -Rested okay last night, continues to have back pain and intermittent hiccups -TEE performed this morning without evidence of endocarditis or vegetation on pacer leads, confirmed malposition of pacemaker wire into the LV -No chest pain, shortness of breath   Vitals:   08/19/22 0830 08/19/22 0845 08/19/22 0900 08/19/22 1330  BP: (!) 162/81   (!) 160/86  Pulse: 77   82  Resp: 17 (!) 22  16  Temp:    98.7 F (37.1 C)  TempSrc:      SpO2: 98%  98% 96%  Weight:      Height:         Intake/Output Summary (Last 24 hours) at 08/19/2022 1432 Last data filed at 08/19/2022 0800 Gross per 24 hour  Intake 2193.86 ml  Output 550 ml  Net 1643.86 ml    PHYSICAL EXAM  General: Pleasant elderly Caucasian male, well nourished, in no acute distress.  Sitting at incline in hospital bed with wife at bedside HEENT:  Normocephalic and atraumatic Neck:  No JVD.  Lungs: Normal respiratory effort on room air.  Clear bilaterally to auscultation Heart: Regular rate and rhythm with occasional paced beats. Normal S1 and S2 without gallops or murmurs.  Abdomen: Nondistended appearing  Msk:   Normal strength and tone for age. Extremities: No clubbing, cyanosis or edema.   Neuro: Alert and oriented X 3. Psych:  Good affect, responds appropriately   LABS: Basic Metabolic Panel: Recent Labs    08/17/22 0530 08/18/22 0406  NA 138 141  K 3.9 3.9  CL 109 109  CO2 22 20*  GLUCOSE 88 115*  BUN 28* 30*  CREATININE  2.06* 2.05*  CALCIUM 8.4* 8.8*    Liver Function Tests: Recent Labs    08/17/22 0530  AST 16  ALT 15  ALKPHOS 52  BILITOT 0.7  PROT 5.2*  ALBUMIN 2.7*    No results for input(s): "LIPASE", "AMYLASE" in the last 72 hours. CBC: Recent Labs    08/17/22 0530 08/18/22 0406  WBC 5.5 7.2  HGB 8.0* 9.4*  HCT 26.1* 30.0*  MCV 91.3 88.8  PLT 136* 193    Cardiac Enzymes: No results for input(s): "CKTOTAL",  "CKMB", "CKMBINDEX", "TROPONINI" in the last 72 hours. BNP: Invalid input(s): "POCBNP" D-Dimer: No results for input(s): "DDIMER" in the last 72 hours. Hemoglobin A1C: No results for input(s): "HGBA1C" in the last 72 hours. Fasting Lipid Panel: No results for input(s): "CHOL", "HDL", "LDLCALC", "TRIG", "CHOLHDL", "LDLDIRECT" in the last 72 hours. Thyroid Function Tests: No results for input(s): "TSH", "T4TOTAL", "T3FREE", "THYROIDAB" in the last 72 hours.  Invalid input(s): "FREET3" Anemia Panel: No results for input(s): "VITAMINB12", "FOLATE", "FERRITIN", "TIBC", "IRON", "RETICCTPCT" in the last 72 hours.  CT LUMBAR SPINE W CONTRAST  Result Date: 08/17/2022 CLINICAL DATA:  Low back pain EXAM: CT LUMBAR SPINE WITH CONTRAST TECHNIQUE: Multidetector CT imaging of the lumbar spine was performed with intravenous contrast administration. RADIATION DOSE REDUCTION: This exam was performed according to the departmental dose-optimization program which includes automated exposure control, adjustment of the mA and/or kV according to patient size and/or use of iterative reconstruction technique. CONTRAST:  3mL OMNIPAQUE IOHEXOL 350 MG/ML SOLN COMPARISON:  11/16/2016 CT lumbar spine FINDINGS: Segmentation: 5 lumbar-type vertebral bodies. Alignment: 3 mm retrolisthesis of L1 on L2 and L2 on L3, unchanged. 12 mm anterolisthesis L5 on S1 (grade 2), unchanged. Dextrocurvature of the lumbar spine with compensatory levocurvature of the lower lumbar spine. 6 mm left lateral listhesis of L1 on L2 4 mm right lateral listhesis of L2 on L3. 6 mm right lateral listhesis of L3 on L4. Lateral listhesis also appear unchanged compared to prior exam. Vertebrae: No acute fracture or suspicious osseous lesion. Bilateral L5 pars defects. Status post L5-S1 posterior fusion with evidence of osseous fusion across the disc space. Endplate degenerative changes at L2-L3, eccentric to the left, and L4-L5, eccentric to the right.  Paraspinal and other soft tissues: Aortic aneurysm, status post EVAR, which is better evaluated on the 08/14/2022 CTA abdomen pelvis. Redemonstrated left renal cyst, for which no follow-up is currently indicated. Nonobstructing left renal calculus. Disc levels: T12-L1: Left eccentric disc bulge. Mild facet arthropathy. No spinal canal stenosis or significant neural foraminal narrowing. L1-L2: Mild retrolisthesis with mild disc bulge. Mild facet arthropathy. No spinal canal stenosis or neural foraminal narrowing. L2-L3: Mild retrolisthesis and mild disc bulge. Mild facet arthropathy. Narrowing of the lateral recesses. No spinal canal stenosis or neural foraminal narrowing. L3-L4: Mild disc bulge. Mild facet arthropathy. No spinal canal stenosis or neural foraminal narrowing. L4-L5: Right-greater-than-left disc height loss and mild disc bulge. Moderate facet arthropathy. No spinal canal stenosis. Mild bilateral neural foraminal narrowing. L5-S1: Redemonstrated bilateral pars defect with grade 2 anterolisthesis. No spinal canal stenosis. Moderate bilateral neural foraminal narrowing. IMPRESSION: 1. Status post L5-S1 posterior fusion, with evidence of osseous fusion across the disc space. 2. 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 3. Multilevel degenerative disc disease, worst at L4-L5 and L5-S1, with mild bilateral neural foraminal narrowing at L4-L5 and moderate bilateral neural foraminal narrowing at L5-S1. 4. No spinal canal stenosis. 5. No acute fracture or  traumatic listhesis. Electronically Signed   By: Merilyn Baba M.D.   On: 08/17/2022 18:25   CT Angio Chest Pulmonary Embolism (PE) W or WO Contrast  Result Date: 08/17/2022 CLINICAL DATA:  Known pulmonary embolism, follow-up EXAM: CT ANGIOGRAPHY CHEST WITH CONTRAST TECHNIQUE: Multidetector CT imaging of the chest was performed using the standard protocol during bolus administration of intravenous contrast. Multiplanar  CT image reconstructions and MIPs were obtained to evaluate the vascular anatomy. RADIATION DOSE REDUCTION: This exam was performed according to the departmental dose-optimization program which includes automated exposure control, adjustment of the mA and/or kV according to patient size and/or use of iterative reconstruction technique. CONTRAST:  62mL OMNIPAQUE IOHEXOL 350 MG/ML SOLN COMPARISON:  08/14/2022 FINDINGS: Cardiovascular: Extensive atherosclerotic calcifications aorta, proximal great vessels, and coronary arteries. 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 Enlargement of cardiac chambers. Pacemaker leads LEFT ventricle and RIGHT atrial appendage. No pericardial effusion. Pulmonary arteries adequately opacified and patent. No pulmonary emboli identified. Specifically, pulmonary embolus in LEFT upper lobe pulmonary arterial branch on previous exam no longer seen. Mediastinum/Nodes: Esophagus unremarkable. Base of cervical region normal appearance. Beam hardening artifacts from shoulder prostheses traverse chest. Significant mucus within RIGHT mainstem bronchus extending into bronchus intermedius and RIGHT lower lobe. No adenopathy. Lungs/Pleura: BILATERAL pleural effusions. Emphysematous changes. Mild compressive atelectasis RIGHT lower lobe. No infiltrate or pneumothorax. Upper Abdomen: No significant upper abdominal findings. Musculoskeletal: Osseous structures unremarkable. Review of the MIP images confirms the above findings. IMPRESSION: No evidence of pulmonary embolism; resolution of small pulmonary embolus in a LEFT upper lobe pulmonary arterial branch seen on previous study. Significant mucus within RIGHT mainstem bronchus extending into bronchus intermedius and RIGHT lower lobe. BILATERAL pleural effusions and compressive atelectasis RIGHT lower lobe. Extensive atherosclerotic calcifications including coronary arteries. Aneurysmal dilatation ascending thoracic aorta 4.4 cm  transverse; Recommend annual imaging followup by CTA or MRA. This recommendation follows 2010 ACCF/AHA/AATS/ACR/ASA/SCA/SCAI/SIR/STS/SVM Guidelines for the Diagnosis and Management of Patients with Thoracic Aortic Disease. Circulation. 2010; 121: G401-U272. Aortic aneurysm NOS (ICD10-I71.9) Aortic Atherosclerosis (ICD10-I70.0). Aortic aneurysm NOS (ICD10-I71.9). Emphysema (ICD10-J43.9). Electronically Signed   By: Lavonia Dana M.D.   On: 08/17/2022 17:18     TELEMETRY: Sinus tachycardia overnight with rates in the 110s, currently in sinus rhythm rates 80s.  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  Preoperative cardiovascular assessment.  Based on patients age and comorbidities, the patient poses at least mild to moderate risk for cardiovascular complication during surgical repair of abdominal aortic aneurysm.  The patient appears to be medically  optimized.  Patient denies chest pain.  Dual chamber pacemaker appears to be functioning appropriately.  High-sensitivity troponin borderline elevated in  the absence of chest pain, most likely elevated due to underlying chronic kidney disease. 2.  Known CAD, CABG x3, currently without chest pain 3.  Abdominal aortic aneurysm, s/p coil embolization with vascular surgery on 10/9 4.  Status post dual-chamber pacemaker for second-degree AV block, with CTA abdomen pelvis showing unusual lead placement extending through an interarterial defect and terminating in the left ventricle, of uncertain clinical significance. 5.  Chronic kidney disease, stage IIIb 6.  Severe back pain, uncertain etiology, CT spine with contrast negative for osteomyelitis, revealed multilevel degenerative disc disease, worse at L4-L5 and L5-S1 with mild bilateral neural foraminal narrowing at L4-L5 and moderate bilateral neuroforaminal narrowing at L5-S1.  No spinal canal stenosis or acute fracture or traumatic listhesis 7.  E. Faecalis bacteremia, 2D echocardiogram negative for vegetation, TEE performed 10/12 and did not reveal pacer lead vegetation or endocarditis 8. ? Pulmonary embolism -ruled out, recent low risk VQ scan, on CTA abdomen pelvis noted small PE, CTA chest negative for PE, revealed significant mucus in the right mainstem bronchus  Recommendations   1.  Agree with current therapy 2.  Ongoing discussions regarding goals of care versus symptom management, seen by palliative care today.  His primary concern remains pain control. 3. No further cardiac diagnostics neccessary  4.  Cardiology will sign off.  Please reengage if needed or if there is a change in goals of care that would necessitate further cardiac work-up including pacemaker lead extraction and replacement due to its malposition (which appears chronic in nature).   This patient's plan of care was discussed and created with Dr. Clayborn Bigness and he is in agreement.    Tristan Schroeder, PA-C 08/19/2022 2:32 PM

## 2022-08-19 NOTE — Progress Notes (Signed)
PROGRESS NOTE    Rick Mcbride.  ZHY:[id number] DOB: 26-May-1941 DOA: 08/14/2022 PCP: Idelle Crouch, MD    Brief Narrative:  Rick Mcbride. is a 81 y.o. 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 Patient had surgery and finished SNF rehab. Currently he is doing PT/OT at home), chronic lower back pain (L5-S1 fusion), who presents with lower back pain and shortness breath. Pt had negative PE by V/Q scan which is ordered by his PCP day PTA, which was done for persistent SOB postoperatively. 10/07: WBC 3.8, negative COVID PCR, lactic acid 0.9, troponin level 31, 30, slightly worsening renal function with creatinine 2.69, BUN 31, GFR 23 (recent baseline creatinine 2.39 06/15/2022), temperature normal, blood pressure 172/84, 129/64, RR 83, heart rate 83, RR 17, oxygen saturation 98% on room air.  Chest x-ray showed possible mild patchy infiltration in left lower lobe.  CT head negative.  CT scan showed enlargement of AAA from previous 6.9 to 7.6 cm now.  Patient is admitted to telemetry bed as inpatient. Dr. Lorenso Courier of [facility] was consulted re: AAA, needs CTA, nephrology consulted and recommended IV fluids prior to CTA of Abd/Pelvis. Cardiology also consulted for surgical optimization/risk stratification - medically optimized at this time, troponin mild elevation likely d/t CKD4. BCx (+) E Faecalis, ID recommended r/o endocarditis/pacer infection w/ TEE and vascular surgery to assess re: graft involvement, changed Abx to ampicillin and repeat Bcx, also recommend MRI L-spine or CT w/ contrast, pt has pacemaker   10/08:  Pt and wife agreeable to TEE and MRI, though MRI would have to be at [facility]. Per vascular: "Noted bacteremia- however, patient with chronic progressive lower back pain; expanding aortic sac may be contributory to pain- not definitive. Therefore, preferable to intervene sooner than later." Per ID, would consider in order of importance to 1) treat bacteremia, 2) address aorto-bifem leak, 3) TEE, 4) MRI/CT L-spine. IF we decide to do CT, this can be done in-house, vs RI would require transfer. Will plan to proceed w/ IV abx, do vascular procedure tomorrow as scheduled, and see how he progresses from there.  10/09: Vascular procedure today to fix graft.  Radiology reached out this morning, concern for small PE noted on CTA abdomen/pelvis.  Holding off on full CTA chest at this time due to concern with contrast/renal function but will plan for heparin. 10/10: Renal function okay. CT w/ contrast lumbar spine - no osteomyelitis noted. CTA chest no PE but Significant mucus within RIGHT mainstem bronchus extending into bronchus intermedius and RIGHT lower lobe. Continue IV fluids and recheck BMP in AM. TEE planned for 10/12.  10/11 TEE plan for tomorrow. Palliative consulted 10/12 TEE this am. [facility].  Consultants:  Vascular surgery - TAA, AAA enlarged, R common iliac aneurysm Nephrology - CKD4 needing contrast study Cardiology - clearance for vascular procedure given CAD/CABG, Chronic HFpEF ID - (+)Bcx  Procedures: 08/16/2022: Repair of aortic aneurysm sac type II endoleak  Antimicrobials:  Ampicillin Ceftriaxone   Subjective: Still with back pain. Wife deciding on snf with hospice.  Objective: Vitals:   08/19/22 0820 08/19/22 0825  08/19/22 0830 08/19/22 0845  BP: (!) 170/97 (!) 143/91 (!) 162/81   Pulse: 84 90 77   Resp: 18 19 17  (!) 22  Temp:      TempSrc:      SpO2: 97% 98% 98%   Weight:      Height:        Intake/Output Summary (Last 24  hours) at 08/19/2022 0857 Last data filed at 08/19/2022 0200 Gross per 24 hour  Intake 2313.86 ml  Output 775 ml  Net 1538.86 ml   Filed Weights   08/16/22 0226 08/19/22 0512 08/19/22 0748  Weight: 86.1 kg 93.4 kg 93.4 kg    Examination: Calm, NAD Cta no w/r Reg s1/s2 no gallop Soft benign +bs No edema Awake and falls a sleep.  Mood and affect appropriate in current setting    Data Reviewed: I have personally reviewed following labs and imaging studies  CBC: Recent Labs  Lab 08/14/22 0247 08/15/22 0440 08/16/22 0403 08/17/22 0530 08/18/22 0406  WBC 3.8* 4.4 5.4 5.5 7.2  HGB 8.6* 7.8* 7.9* 8.0* 9.4*  HCT 28.6* 25.5* 25.4* 26.1* 30.0*  MCV 92.9 90.7 91.0 91.3 88.8  PLT 136* 149* 155 136* 177   Basic Metabolic Panel: Recent Labs  Lab 08/14/22 0247 08/15/22 0440 08/16/22 0403 08/17/22 0530 08/18/22 0406  NA 137 137 138 138 141  K 4.6 4.3 4.5 3.9 3.9  CL 103 108 109 109 109  CO2 24 24 22 22  20*  GLUCOSE 104* 104* 106* 88 115*  BUN 31* 32* 30* 28* 30*  CREATININE 2.69* 2.33* 2.10* 2.06* 2.05*  CALCIUM 9.1 8.7* 8.6* 8.4* 8.8*   GFR: Estimated Creatinine Clearance: 33 mL/min (A) (by C-G formula based on SCr of 2.05 mg/dL (H)). Liver Function Tests: Recent Labs  Lab 08/17/22 0530  AST 16  ALT 15  ALKPHOS 52  BILITOT 0.7  PROT 5.2*  ALBUMIN 2.7*   No results for input(s): "LIPASE", "AMYLASE" in the last 168 hours. No results for input(s): "AMMONIA" in the last 168 hours. Coagulation Profile: Recent Labs  Lab 08/14/22 1958 08/17/22 1314  INR 1.1 1.2   Cardiac Enzymes: No results for input(s): "CKTOTAL", "CKMB", "CKMBINDEX", "TROPONINI" in the last 168 hours. BNP (last 3 results) No results for input(s): "PROBNP" in the last 8760 hours. HbA1C: No results for input(s): "HGBA1C" in the last 72 hours. CBG: No results for input(s): "GLUCAP" in the last 168 hours. Lipid Profile: No results for input(s): "CHOL", "HDL", "LDLCALC", "TRIG", "CHOLHDL",  "LDLDIRECT" in the last 72 hours. Thyroid Function Tests: No results for input(s): "TSH", "T4TOTAL", "FREET4", "T3FREE", "THYROIDAB" in the last 72 hours. Anemia Panel: No results for input(s): "VITAMINB12", "FOLATE", "FERRITIN", "TIBC", "IRON", "RETICCTPCT" in the last 72 hours. Sepsis Labs: Recent Labs  Lab 08/14/22 0247 08/14/22 0357  PROCALCITON 0.37  --   LATICACIDVEN  --  0.9    Recent Results (from the past 240 hour(s))  Culture, blood (routine x 2)     Status: Abnormal   Collection Time: 08/14/22  3:57 AM   Specimen: BLOOD  Result Value Ref Range Status   Specimen Description   Final    BLOOD BLOOD RIGHT ARM Performed at [facility], [address], [place], [place] [address]    Special Requests   Final    BOTTLES DRAWN AEROBIC AND ANAEROBIC Blood Culture adequate volume Performed at [facility], [address] [place], [place] [address]    Culture  Setup Time   Final  Organism ID to follow GRAM POSITIVE COCCI IN BOTH AEROBIC AND ANAEROBIC BOTTLES CRITICAL RESULT CALLED TO, READ BACK BY AND VERIFIED WITH: PHARMD CHILDS AT 1957 08/14/2022 GAA Performed at [facility], [address] [place], [place] [address]    Culture ENTEROCOCCUS FAECALIS (A)  Final   Report Status 08/17/2022 FINAL  Final   Organism ID, Bacteria ENTEROCOCCUS FAECALIS  Final      Susceptibility   Enterococcus faecalis - MIC*    AMPICILLIN <=2 SENSITIVE Sensitive     VANCOMYCIN 1 SENSITIVE Sensitive     GENTAMICIN SYNERGY SENSITIVE Sensitive     * ENTEROCOCCUS FAECALIS  Culture, blood (routine x 2)     Status: Abnormal   Collection Time: 08/14/22  3:57 AM   Specimen: BLOOD  Result Value Ref Range Status   Specimen Description   Final    BLOOD BLOOD LEFT ARM Performed at [facility], [address] [place], [place] [address]    Special Requests   Final    BOTTLES DRAWN AEROBIC AND ANAEROBIC Blood Culture adequate volume Performed at [facility], [address] [place], [place] [address]    Culture  Setup Time   Final    GRAM POSITIVE COCCI AEROBIC BOTTLE ONLY CRITICAL RESULT CALLED TO, READ BACK BY AND VERIFIED WITH: PHARMD CHILDS AT [place] 08/14/2022 GAA GRAM STAIN REVIEWED-AGREE WITH RESULT Performed at [facility], [address] [place], [place] [address]    Culture (A)  Final    ENTEROCOCCUS FAECALIS SUSCEPTIBILITIES PERFORMED ON PREVIOUS CULTURE WITHIN THE LAST 5 DAYS. Performed at [facility], [place] [address], [place],  [address]    Report Status 08/17/2022 FINAL  Final  Resp Panel by RT-PCR (Flu A&B, Covid) Anterior Nasal Swab     Status: None   Collection Time: 08/14/22  3:57 AM   Specimen: Anterior Nasal Swab  Result Value Ref Range Status   SARS Coronavirus 2 by RT PCR NEGATIVE NEGATIVE Final    Comment: (NOTE) SARS-CoV-2 target nucleic acids are NOT DETECTED.  The SARS-CoV-2 RNA is generally detectable in upper respiratory specimens during the acute phase of infection. The lowest concentration of SARS-CoV-2 viral copies this assay can detect is 138 copies/mL. A negative result does not preclude SARS-Cov-2 infection and should not be used as the sole basis for treatment or other patient management decisions. A negative result may occur with  improper specimen collection/handling, submission of specimen other than nasopharyngeal swab, presence of viral mutation(s) within the areas targeted by this assay, and inadequate number of viral copies(<138 copies/mL). A negative result must be combined with clinical observations, patient history, and epidemiological information. The expected result is Negative.  Fact Sheet for Patients:  EntrepreneurPulse.com.au  Fact Sheet for Healthcare Providers:  IncredibleEmployment.be  This test is no t yet approved or cleared by the [place] FDA and  has been authorized for detection and/or  diagnosis of SARS-CoV-2 by FDA under an Emergency Use Authorization (EUA). This EUA will remain  in effect (meaning this test can be used) for the duration of the COVID-19 declaration under Section 564(b)(1) of the Act, 21 U.S.C.section 360bbb-3(b)(1), unless the authorization is terminated  or revoked sooner.       Influenza A by PCR NEGATIVE NEGATIVE Final   Influenza B by PCR NEGATIVE NEGATIVE Final    Comment: (NOTE) The Xpert Xpress SARS-CoV-2/FLU/RSV plus assay is intended as an aid in the diagnosis of influenza from Nasopharyngeal swab specimens and should  not be used as a sole basis for treatment. Nasal washings and aspirates are unacceptable for Xpert Xpress SARS-CoV-2/FLU/RSV testing.  Fact Sheet for Patients: EntrepreneurPulse.com.au  Fact Sheet for Healthcare Providers: IncredibleEmployment.be  This test is not yet approved or cleared by the [place] FDA and has been authorized for detection and/or diagnosis of SARS-CoV-2 by FDA under an Emergency Use Authorization (EUA). This EUA will remain in effect (meaning this test can be used) for the duration of the COVID-19 declaration under Section 564(b)(1) of the Act, 21 U.S.C. section 360bbb-3(b)(1), unless the authorization is terminated or revoked.  Performed at [facility], [address] [place], [place] [address]   Blood Culture ID Panel (Reflexed)     Status: Abnormal   Collection Time: 08/14/22  3:57 AM  Result Value Ref Range Status   Enterococcus faecalis DETECTED (A) NOT DETECTED Final    Comment: CRITICAL RESULT CALLED TO, READ BACK BY AND VERIFIED WITH: PHARMD CHILDS AT 1957 08/14/2022 GAA    Enterococcus Faecium NOT DETECTED NOT DETECTED Final   Listeria monocytogenes NOT DETECTED NOT DETECTED Final   Staphylococcus species NOT DETECTED NOT DETECTED Final   Staphylococcus aureus (BCID) NOT DETECTED NOT DETECTED Final   Staphylococcus epidermidis NOT  DETECTED NOT DETECTED Final   Staphylococcus lugdunensis NOT DETECTED NOT DETECTED Final   Streptococcus species NOT DETECTED NOT DETECTED Final   Streptococcus agalactiae NOT DETECTED NOT DETECTED Final   Streptococcus pneumoniae NOT DETECTED NOT DETECTED Final   Streptococcus pyogenes NOT DETECTED NOT DETECTED Final   A.calcoaceticus-baumannii NOT DETECTED NOT DETECTED Final   Bacteroides fragilis NOT DETECTED NOT DETECTED Final   Enterobacterales NOT DETECTED NOT DETECTED Final   Enterobacter cloacae complex NOT DETECTED NOT DETECTED Final   Escherichia coli NOT DETECTED NOT DETECTED Final   Klebsiella aerogenes NOT DETECTED NOT DETECTED Final   Klebsiella oxytoca NOT DETECTED NOT DETECTED Final   Klebsiella pneumoniae NOT DETECTED NOT DETECTED Final   Proteus species NOT DETECTED NOT DETECTED Final   Salmonella species NOT DETECTED NOT DETECTED Final   Serratia marcescens NOT DETECTED NOT DETECTED Final   Haemophilus influenzae NOT DETECTED NOT DETECTED Final   Neisseria meningitidis NOT DETECTED NOT DETECTED Final   Pseudomonas aeruginosa NOT DETECTED NOT DETECTED Final   Stenotrophomonas maltophilia NOT DETECTED NOT DETECTED Final   Candida albicans NOT DETECTED NOT DETECTED Final   Candida auris NOT DETECTED NOT DETECTED Final   Candida glabrata NOT DETECTED NOT DETECTED Final   Candida krusei NOT DETECTED NOT DETECTED Final   Candida parapsilosis NOT DETECTED NOT DETECTED Final   Candida tropicalis NOT DETECTED NOT DETECTED Final   Cryptococcus neoformans/gattii NOT DETECTED NOT DETECTED Final   Vancomycin resistance NOT DETECTED NOT DETECTED Final    Comment: Performed at [facility], [address], [place], [place] [address]  Urine Culture     Status: Abnormal   Collection Time: 08/14/22  6:54 AM   Specimen: Urine, Clean Catch  Result Value Ref Range Status   Specimen Description   Final    URINE, CLEAN CATCH Performed at [facility], [address] [place], [place] [address]    Special Requests   Final    NONE Performed at [facility], [address] [place], [place] [address]    Culture >=100,000 COLONIES/mL ENTEROCOCCUS FAECALIS (A)  Final   Report Status 08/16/2022 FINAL  Final   Organism ID, Bacteria ENTEROCOCCUS FAECALIS (A)  Final      Susceptibility  Enterococcus faecalis - MIC*    AMPICILLIN <=2 SENSITIVE Sensitive     NITROFURANTOIN <=16 SENSITIVE Sensitive     VANCOMYCIN 1 SENSITIVE Sensitive     * >=100,000 COLONIES/mL ENTEROCOCCUS FAECALIS  Culture, blood (Routine X 2) w Reflex to ID Panel     Status: None (Preliminary result)   Collection Time: 08/15/22 12:22 PM   Specimen: Right Antecubital; Blood  Result Value Ref Range Status   Specimen Description RIGHT ANTECUBITAL  Final   Special Requests   Final    BOTTLES DRAWN AEROBIC AND ANAEROBIC Blood Culture adequate volume   Culture   Final    NO GROWTH 3 DAYS Performed at [facility], [address], [place], [place] [address]    Report Status PENDING  Incomplete  Culture, blood (Routine X 2) w Reflex to ID Panel     Status: None (Preliminary result)   Collection Time: 08/15/22 12:22 PM   Specimen: BLOOD RIGHT ARM  Result Value Ref Range Status   Specimen Description BLOOD RIGHT ARM  Final   Special Requests   Final    BOTTLES DRAWN AEROBIC AND ANAEROBIC Blood Culture adequate volume   Culture   Final    NO GROWTH 3 DAYS Performed at [facility], [address] [place], [place] [address]    Report Status PENDING  Incomplete         Radiology Studies: CT LUMBAR SPINE W CONTRAST  Result Date: 08/17/2022 CLINICAL DATA:  Low back pain EXAM: CT LUMBAR SPINE WITH CONTRAST TECHNIQUE: Multidetector CT imaging of the lumbar spine was performed with intravenous contrast administration. RADIATION DOSE REDUCTION: This exam was performed according to the departmental dose-optimization program which includes automated  exposure control, adjustment of the mA and/or kV according to patient size and/or use of iterative reconstruction technique. CONTRAST:  67mL OMNIPAQUE IOHEXOL 350 MG/ML SOLN COMPARISON:  11/16/2016 CT lumbar spine FINDINGS: Segmentation: 5 lumbar-type vertebral bodies. Alignment: 3 mm retrolisthesis of L1 on L2 and L2 on L3, unchanged. 12 mm anterolisthesis L5 on S1 (grade 2), unchanged. Dextrocurvature of the lumbar spine with compensatory levocurvature of the lower lumbar spine. 6 mm left lateral listhesis of L1 on L2 4 mm right lateral listhesis of L2 on L3. 6 mm right lateral listhesis of L3 on L4. Lateral listhesis also appear unchanged compared to prior exam. Vertebrae: No acute fracture or suspicious osseous lesion. Bilateral L5 pars defects. Status post L5-S1 posterior fusion with evidence of osseous fusion across the disc space. Endplate degenerative changes at L2-L3, eccentric to the left, and L4-L5, eccentric to the right. Paraspinal and other soft tissues: Aortic aneurysm, status post EVAR, which is better evaluated on the 08/14/2022 CTA abdomen pelvis. Redemonstrated left renal cyst, for which no follow-up is currently indicated. Nonobstructing left renal calculus. Disc levels: T12-L1: Left eccentric disc bulge. Mild facet arthropathy. No spinal canal stenosis or significant neural foraminal narrowing. L1-L2: Mild retrolisthesis with mild disc bulge. Mild facet arthropathy. No spinal canal stenosis or neural foraminal narrowing. L2-L3: Mild retrolisthesis and mild disc bulge. Mild facet arthropathy. Narrowing of the lateral recesses. No spinal canal stenosis or neural foraminal narrowing. L3-L4: Mild disc bulge. Mild facet arthropathy. No spinal canal stenosis or neural foraminal narrowing. L4-L5: Right-greater-than-left disc height loss and mild disc bulge. Moderate facet arthropathy. No spinal canal stenosis. Mild bilateral neural foraminal narrowing. L5-S1: Redemonstrated bilateral pars defect with  grade 2 anterolisthesis. No spinal canal stenosis. Moderate bilateral neural foraminal narrowing. IMPRESSION: 1. Status post L5-S1  posterior fusion, with evidence of osseous fusion across the disc space. 2. 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 3. Multilevel degenerative disc disease, worst at L4-L5 and L5-S1, with mild bilateral neural foraminal narrowing at L4-L5 and moderate bilateral neural foraminal narrowing at L5-S1. 4. No spinal canal stenosis. 5. No acute fracture or traumatic listhesis. Electronically Signed   By: Merilyn Baba M.D.   On: 08/17/2022 18:25   CT Angio Chest Pulmonary Embolism (PE) W or WO Contrast  Result Date: 08/17/2022 CLINICAL DATA:  Known pulmonary embolism, follow-up EXAM: CT ANGIOGRAPHY CHEST WITH CONTRAST TECHNIQUE: Multidetector CT imaging of the chest was performed using the standard protocol during bolus administration of intravenous contrast. Multiplanar CT image reconstructions and MIPs were obtained to evaluate the vascular anatomy. RADIATION DOSE REDUCTION: This exam was performed according to the departmental dose-optimization program which includes automated exposure control, adjustment of the mA and/or kV according to patient size and/or use of iterative reconstruction technique. CONTRAST:  55mL OMNIPAQUE IOHEXOL 350 MG/ML SOLN COMPARISON:  08/14/2022 FINDINGS: Cardiovascular: Extensive atherosclerotic calcifications aorta, proximal great vessels, and coronary arteries. 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 Enlargement of cardiac chambers. Pacemaker leads LEFT ventricle and RIGHT atrial appendage. No pericardial effusion. Pulmonary arteries adequately opacified and patent. No pulmonary emboli identified. Specifically, pulmonary embolus in LEFT upper lobe pulmonary arterial branch on previous exam no longer seen. Mediastinum/Nodes: Esophagus unremarkable. Base of cervical region normal appearance. Beam  hardening artifacts from shoulder prostheses traverse chest. Significant mucus within RIGHT mainstem bronchus extending into bronchus intermedius and RIGHT lower lobe. No adenopathy. Lungs/Pleura: BILATERAL pleural effusions. Emphysematous changes. Mild compressive atelectasis RIGHT lower lobe. No infiltrate or pneumothorax. Upper Abdomen: No significant upper abdominal findings. Musculoskeletal: Osseous structures unremarkable. Review of the MIP images confirms the above findings. IMPRESSION: No evidence of pulmonary embolism; resolution of small pulmonary embolus in a LEFT upper lobe pulmonary arterial branch seen on previous study. Significant mucus within RIGHT mainstem bronchus extending into bronchus intermedius and RIGHT lower lobe. BILATERAL pleural effusions and compressive atelectasis RIGHT lower lobe. Extensive atherosclerotic calcifications including coronary arteries. 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 This recommendation follows 2010 ACCF/AHA/AATS/ACR/ASA/SCA/SCAI/SIR/STS/SVM Guidelines for the Diagnosis and Management of Patients with Thoracic Aortic Disease. Circulation. 2010; 121: E268-T419. Aortic aneurysm NOS (ICD10-I71.9) Aortic Atherosclerosis (ICD10-I70.0). Aortic aneurysm NOS (ICD10-I71.9). Emphysema (ICD10-J43.9). Electronically Signed   By: Lavonia Dana M.D.   On: 08/17/2022 17:18        Scheduled Meds:  [MAR Hold] atorvastatin  40 mg Oral QHS   butamben-tetracaine-benzocaine       [MAR Hold] calcitRIOL  0.25 mcg Oral q AM   [MAR Hold] clopidogrel  75 mg Oral Daily   [MAR Hold] donepezil  10 mg Oral QHS   [MAR Hold] ezetimibe  10 mg Oral QPM   fentaNYL       [MAR Hold] ferrous sulfate  325 mg Oral Q2000   [MAR Hold] heparin injection (subcutaneous)  5,000 Units Subcutaneous Q8H   [MAR Hold] ipratropium  2-4 spray Each Nare QHS   [MAR Hold] lidocaine  1 patch Transdermal Q24H   lidocaine        [MAR Hold] loratadine  10 mg Oral Daily   [MAR Hold] melatonin  10 mg Oral QHS   [MAR Hold] methocarbamol  500 mg Oral Q2000   [MAR Hold] metoprolol succinate  25 mg Oral Daily   midazolam       [  MAR Hold] multivitamin with minerals  1 tablet Oral QPC lunch   [MAR Hold] oxyCODONE-acetaminophen  2 tablet Oral Q6H   [MAR Hold] pantoprazole  40 mg Oral QAC breakfast   [MAR Hold] polyethylene glycol  17 g Oral BID   [MAR Hold] predniSONE  40 mg Oral Q breakfast   [MAR Hold] senna-docusate  2 tablet Oral BID   [MAR Hold] zolpidem  10 mg Oral QHS   Continuous Infusions:  sodium chloride 100 mL/hr at 08/18/22 1937   sodium chloride     [MAR Hold] ampicillin (OMNIPEN) IV 2 g (08/19/22 0524)   [MAR Hold] cefTRIAXone (ROCEPHIN)  IV 2 g (08/18/22 2210)    Assessment & Plan:   Principal Problem:   Complication of aortic graft Active Problems:   COPD exacerbation (HCC)   AAA (abdominal aortic aneurysm) without rupture (HCC)   Thoracic aortic aneurysm without rupture (HCC)   Aneurysm of right common iliac artery (HCC)   CAD S/P CABG x 3   Myocardial injury   Essential hypertension   Chronic diastolic CHF (congestive heart failure) (HCC)   Chronic kidney disease, stage IV (severe) (HCC)   HLD (hyperlipidemia)   Iron deficiency anemia   TIA (transient ischemic attack)   Mild cognitive impairment   Low back pain   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 Lucky Cowboy. Now the size has enlarged from 6.9 to 7.6 cm 10/12 s/p repair on 10/9 by Dr. Lucky Cowboy     Severe back pain Lumbar  OA, DDD Multiple lobar spine hardware 10/11 ct spine with degenerative disc dz, no acute issues 10/12 declined MRI of spine to be done at [facility].  Wife stated he is too weak to be being transferred and they have opted not to be transferred anywhere for any kind of imaging Continue pain control   CAD S/P CABG x 3 Hx of CAD s/p of CABG and myocardial injury due to COPD exacerbation:  Myocardial injury less likely vs Troponin elevation d/t CKD4 Troponin level 31, 30.  Denies chest pain. Continue home Lipitor, Zetia hold ASA consulted Dr. Saralyn Pilar of cardiology per Dr. Dolores Hoose recommendation - optimized from cardiac standpoint, though risk is present. Troponins likely d/t CKD 10/12 TEE this a.m.- results pending    Low back pain This seems to be a chronic issue, has worsened recently. 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 There is osteopenia degenerative change of the lumbar spine with discogenic mild grade 1 retrolisthesis unchanged at L1-2 and L2-3, slight dextroscoliosis. pain control: As needed Percocet, Tylenol, Dilaudid --> scheduled po opiates w/ prn Dilaudid As needed Robaxin Lidoderm MRI L Spine per ID recs given (+)Bcx -patient has pacemaker, family would like to avoid transferring to different facility if possible.   10/12 as above   SOB w/ mucus plugging of bronchi noted on CT PE resolved on CTA (recently diagnosed, see above)  Transition heparin to DVT ppx dose now that no PE Given SOB, may consider pulmonary consult to review CT and eval  if bronchoscopy warranted.., however family deciding on snf with hospice    Chronic kidney disease, stage IV (severe) ([place]) Slightly worsening than baseline. Today 08/17/22 Cr 2.06 (vs 2.69 on admission) Monitor renal function closely by [facility] following - see note for recs re: contrast administration  Planning further contrast studies to eval PE  and lumbar spine - increased fluids for now to 100 mL/h and plan to reduce this pending renal function tomorrow.    Positive blood cultures - E Facealis Bacteremia ID to follow Ampicillin 10/12 TEE today, results pending   Possible COPD exacerbation ([place]) - seems less likely given CT findings from today 10/10 see above Patient is a former smoker, smoked more than 20 years.  CT chest did not show focal infiltration, does not seem to have pneumonia.  Procalcitonin elevated at 0.39.   Patient does not meet criteria for sepsis. will admit to tele bed as inpatient Bronchodilators pt was given 80 mg of Solu-Medrol in ED --> will change to oral prednisone 40 mg daily Z pak  Mucinex for cough  Incentive spirometry Urine S. Pneumococcal and legionella antigen sputum culture Nasal cannula oxygen as needed to maintain O2 saturation 93% or greater if pt desat Consider pulmonary consult    Essential hypertension IV hydralazine as needed Metoprolol hold Cozaar since pt will receive contrast    Chronic diastolic CHF (congestive heart failure) ([place]) 2D echo on 06/14/2022 showed EF of 60 to 65% with grade 1 diastolic dysfunction.   CHF seem to be compensated. Check BNP   HLD (hyperlipidemia) Continue to Lipitor and zetia   Iron deficiency anemia Hemoglobin 8.6 (9.7 on 06/15/2022, and 8.6 on 06/13/2022), no active bleeding. Follow-up with CBC Continue iron supplement   History of TIA (transient ischemic attack) Continue Lipitor Hold ASA and Plavix with pending vascular surgeon's evaluation of his AAA.   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  No acute issues.   Obesity (BMI 30-39.9) BMI= 30.68  and BW= 99.8 Diet and exercise.   Encourage to lose weight.   Anxiety start prn Xanax 0.25 mg bid     DVT prophylaxis: holding pharmacologic given bleed risk , scd Code  Status:dnr Family Communication: wife Disposition Plan:  Status is: Inpatient Remains inpatient appropriate because: iv treatment, w/u pending. TEE in am        LOS: 5 days   Time spent: 35 min    Nolberto Hanlon, MD [facility] Pager [phone]  If 7PM-7AM, please contact night-coverage 08/19/2022, 8:57 AM

## 2022-08-20 ENCOUNTER — Encounter: Payer: Self-pay | Admitting: Internal Medicine

## 2022-08-20 DIAGNOSIS — Z7189 Other specified counseling: Secondary | ICD-10-CM | POA: Diagnosis not present

## 2022-08-20 DIAGNOSIS — B952 Enterococcus as the cause of diseases classified elsewhere: Secondary | ICD-10-CM | POA: Diagnosis not present

## 2022-08-20 DIAGNOSIS — I714 Abdominal aortic aneurysm, without rupture, unspecified: Secondary | ICD-10-CM | POA: Diagnosis not present

## 2022-08-20 DIAGNOSIS — M545 Low back pain, unspecified: Secondary | ICD-10-CM | POA: Diagnosis not present

## 2022-08-20 DIAGNOSIS — R7881 Bacteremia: Secondary | ICD-10-CM | POA: Diagnosis not present

## 2022-08-20 DIAGNOSIS — Z515 Encounter for palliative care: Secondary | ICD-10-CM | POA: Diagnosis not present

## 2022-08-20 DIAGNOSIS — T829XXA Unspecified complication of cardiac and vascular prosthetic device, implant and graft, initial encounter: Secondary | ICD-10-CM | POA: Diagnosis not present

## 2022-08-20 LAB — CULTURE, BLOOD (ROUTINE X 2)
Culture: NO GROWTH
Culture: NO GROWTH
Special Requests: ADEQUATE
Special Requests: ADEQUATE

## 2022-08-20 LAB — CREATININE, SERUM
Creatinine, Ser: 2.05 mg/dL — ABNORMAL HIGH (ref 0.61–1.24)
GFR, Estimated: 32 mL/min — ABNORMAL LOW (ref >60)

## 2022-08-20 MED ORDER — CHLORPROMAZINE HCL 10 MG PO TABS
10.0000 mg | ORAL_TABLET | Freq: Once | ORAL | Status: AC
  Administered 2022-08-20: 10 mg via ORAL
  Filled 2022-08-20: qty 1

## 2022-08-20 MED ORDER — METHOCARBAMOL 500 MG PO TABS
500.0000 mg | ORAL_TABLET | Freq: Three times a day (TID) | ORAL | Status: DC
  Administered 2022-08-20 – 2022-09-01 (×36): 500 mg via ORAL
  Filled 2022-08-20 (×37): qty 1

## 2022-08-20 MED ORDER — CHLORPROMAZINE HCL 25 MG PO TABS
25.0000 mg | ORAL_TABLET | Freq: Three times a day (TID) | ORAL | Status: DC | PRN
  Administered 2022-08-21 – 2022-08-30 (×7): 25 mg via ORAL
  Filled 2022-08-20 (×7): qty 1

## 2022-08-20 MED ORDER — AMLODIPINE BESYLATE 5 MG PO TABS
5.0000 mg | ORAL_TABLET | Freq: Every day | ORAL | Status: DC
  Administered 2022-08-20 – 2022-08-21 (×2): 5 mg via ORAL
  Filled 2022-08-20 (×2): qty 1

## 2022-08-20 NOTE — Progress Notes (Signed)
[place], [place] 08/20/22  Subjective:   Hospital day # 6  Patient known to our practice from outpatient follow-up of CKD, with Dr Candiss Norse.  Patient seen resting quietly in bed States he rested well overnight Continues to have low back pain however not as severe as on admission    10/12 0701 - 10/13 0700 In: 1255.5 [I.V.:855.5; IV Piggyback:400] Out: 1700 [Urine:1700] Lab Results  Component Value Date   CREATININE 2.05 (H) 08/20/2022   CREATININE 2.05 (H) 08/18/2022   CREATININE 2.06 (H) 08/17/2022     Objective:  Vital signs in last 24 hours:  Temp:  [98 F (36.7 C)-98.7 F (37.1 C)] 98.6 F (37 C) (10/13 0337) Pulse Rate:  [69-85] 85 (10/13 0827) Resp:  [16-22] 22 (10/13 0827) BP: (144-178)/(69-86) 171/81 (10/13 0827) SpO2:  [96 %-100 %] 100 % (10/13 0827)  Weight change: 0 kg Filed Weights   08/16/22 0226 08/19/22 0512 08/19/22 0748  Weight: 86.1 kg 93.4 kg 93.4 kg    Intake/Output:    Intake/Output Summary (Last 24 hours) at 08/20/2022 1106 Last data filed at 08/20/2022 0844 Gross per 24 hour  Intake 2391.35 ml  Output 2100 ml  Net 291.35 ml      Physical Exam: General: elderly gentleman, laying in the bed  HEENT Anicteric, moist oral mucous membranes  Pulm/lungs Normal breathing effort, clear to auscultation  CVS/Heart No rub or gallop  Abdomen:  Soft, nontender  Extremities: No peripheral edema  Neurologic: Alert, oriented, pain with movement of right leg  Skin: No acute rashes          Basic Metabolic Panel:  Recent Labs  Lab 08/14/22 0247 08/15/22 0440 08/16/22 0403 08/17/22 0530 08/18/22 0406 08/20/22 0606  NA 137 137 138 138 141  --   K 4.6 4.3 4.5 3.9 3.9  --   CL 103 108 109 109 109  --   CO2 24 24 22 22  20*  --   GLUCOSE 104* 104* 106* 88 115*  --   BUN 31* 32* 30* 28* 30*  --   CREATININE 2.69* 2.33* 2.10* 2.06* 2.05* 2.05*  CALCIUM 9.1 8.7* 8.6* 8.4* 8.8*  --       CBC: Recent Labs   Lab 08/14/22 0247 08/15/22 0440 08/16/22 0403 08/17/22 0530 08/18/22 0406  WBC 3.8* 4.4 5.4 5.5 7.2  HGB 8.6* 7.8* 7.9* 8.0* 9.4*  HCT 28.6* 25.5* 25.4* 26.1* 30.0*  MCV 92.9 90.7 91.0 91.3 88.8  PLT 136* 149* 155 136* 193      No results found for: "HEPBSAG", "HEPBSAB", "HEPBIGM"    Microbiology:  Recent Results (from the past 240 hour(s))  Culture, blood (routine x 2)     Status: Abnormal   Collection Time: 08/14/22  3:57 AM   Specimen: BLOOD  Result Value Ref Range Status   Specimen Description   Final    BLOOD BLOOD RIGHT ARM Performed at [facility], [address], [place], [place] [address]    Special Requests   Final    BOTTLES DRAWN AEROBIC AND ANAEROBIC Blood Culture adequate volume Performed at [facility], [address] [place], [place] [address]    Culture  Setup Time   Final    Organism ID to follow [place] TO, READ BACK BY AND VERIFIED WITH: PHARMD CHILDS AT [place] 08/14/2022 GAA Performed at [facility], [address] [place], [place] [address]  Culture ENTEROCOCCUS FAECALIS (A)  Final   Report Status 08/17/2022 FINAL  Final   Organism ID, Bacteria ENTEROCOCCUS FAECALIS  Final      Susceptibility   Enterococcus faecalis - MIC*    AMPICILLIN <=2 SENSITIVE Sensitive     VANCOMYCIN 1 SENSITIVE Sensitive     GENTAMICIN SYNERGY SENSITIVE Sensitive     * ENTEROCOCCUS FAECALIS  Culture, blood (routine x 2)     Status: Abnormal   Collection Time: 08/14/22  3:57 AM   Specimen: BLOOD  Result Value Ref Range Status   Specimen Description   Final    BLOOD BLOOD LEFT ARM Performed at [facility], [address], [place], [place] [address]    Special Requests   Final    BOTTLES DRAWN AEROBIC AND ANAEROBIC Blood Culture adequate volume Performed at [facility] [address] [place], [place] [address]    Culture  Setup  Time   Final    GRAM POSITIVE COCCI AEROBIC BOTTLE ONLY CRITICAL RESULT CALLED TO, READ BACK BY AND VERIFIED WITH: PHARMD CHILDS AT [place] 08/14/2022 GAA GRAM STAIN REVIEWED-AGREE WITH RESULT Performed at [facility], [address] [place], [place] [address]    Culture (A)  Final    ENTEROCOCCUS FAECALIS SUSCEPTIBILITIES PERFORMED ON PREVIOUS CULTURE WITHIN THE LAST 5 DAYS. Performed at [facility], [place] [address] [place], [place] [address]    Report Status 08/17/2022 FINAL  Final  Resp Panel by RT-PCR (Flu A&B, Covid) Anterior Nasal Swab     Status: None   Collection Time: 08/14/22  3:57 AM   Specimen: Anterior Nasal Swab  Result Value Ref Range Status   SARS Coronavirus 2 by RT PCR NEGATIVE NEGATIVE Final    Comment: (NOTE) SARS-CoV-2 target nucleic acids are NOT DETECTED.  The SARS-CoV-2 RNA is generally detectable in upper respiratory specimens during the acute phase of infection. The lowest concentration of SARS-CoV-2 viral copies this assay can detect is 138 copies/mL. A negative result does not preclude SARS-Cov-2 infection and should not be used as the sole basis for treatment or other patient management decisions. A negative result may occur with  improper specimen collection/handling, submission of specimen other than nasopharyngeal swab, presence of viral mutation(s) within the areas targeted by this assay, and inadequate number of viral copies(<138 copies/mL). A negative result must be combined with clinical observations, patient history, and epidemiological information. The expected result is Negative.  Fact Sheet for Patients:  EntrepreneurPulse.com.au  Fact Sheet for Healthcare Providers:  IncredibleEmployment.be  This test is no t yet approved or cleared by the [place] FDA and  has been authorized for detection and/or diagnosis of SARS-CoV-2 by FDA under an Emergency Use Authorization (EUA). This EUA  will remain  in effect (meaning this test can be used) for the duration of the COVID-19 declaration under Section 564(b)(1) of the Act, 21 U.S.C.section 360bbb-3(b)(1), unless the authorization is terminated  or revoked sooner.       Influenza A by PCR NEGATIVE NEGATIVE Final   Influenza B by PCR NEGATIVE NEGATIVE Final    Comment: (NOTE) The Xpert Xpress SARS-CoV-2/FLU/RSV plus assay is intended as an aid in the diagnosis of influenza from Nasopharyngeal swab specimens and should not be used as a sole basis for treatment. Nasal washings and aspirates are unacceptable for Xpert Xpress SARS-CoV-2/FLU/RSV testing.  Fact Sheet for Patients: EntrepreneurPulse.com.au  Fact Sheet for Healthcare Providers: IncredibleEmployment.be  This test is not yet approved or cleared by the  [place] Architectural technologist and has been authorized for detection and/or diagnosis of SARS-CoV-2 by FDA under an Print production planner (EUA). This EUA will remain in effect (meaning this test can be used) for the duration of the COVID-19 declaration under Section 564(b)(1) of the Act, 21 U.S.C. section 360bbb-3(b)(1), unless the authorization is terminated or revoked.  Performed at [facility], [address] [place], [place] [address]   Blood Culture ID Panel (Reflexed)     Status: Abnormal   Collection Time: 08/14/22  3:57 AM  Result Value Ref Range Status   Enterococcus faecalis DETECTED (A) NOT DETECTED Final    Comment: CRITICAL RESULT CALLED TO, READ BACK BY AND VERIFIED WITH: PHARMD CHILDS AT 1957 08/14/2022 GAA    Enterococcus Faecium NOT DETECTED NOT DETECTED Final   Listeria monocytogenes NOT DETECTED NOT DETECTED Final   Staphylococcus species NOT DETECTED NOT DETECTED Final   Staphylococcus aureus (BCID) NOT DETECTED NOT DETECTED Final   Staphylococcus epidermidis NOT DETECTED NOT DETECTED Final   Staphylococcus lugdunensis NOT DETECTED NOT DETECTED  Final   Streptococcus species NOT DETECTED NOT DETECTED Final   Streptococcus agalactiae NOT DETECTED NOT DETECTED Final   Streptococcus pneumoniae NOT DETECTED NOT DETECTED Final   Streptococcus pyogenes NOT DETECTED NOT DETECTED Final   A.calcoaceticus-baumannii NOT DETECTED NOT DETECTED Final   Bacteroides fragilis NOT DETECTED NOT DETECTED Final   Enterobacterales NOT DETECTED NOT DETECTED Final   Enterobacter cloacae complex NOT DETECTED NOT DETECTED Final   Escherichia coli NOT DETECTED NOT DETECTED Final   Klebsiella aerogenes NOT DETECTED NOT DETECTED Final   Klebsiella oxytoca NOT DETECTED NOT DETECTED Final   Klebsiella pneumoniae NOT DETECTED NOT DETECTED Final   Proteus species NOT DETECTED NOT DETECTED Final   Salmonella species NOT DETECTED NOT DETECTED Final   Serratia marcescens NOT DETECTED NOT DETECTED Final   Haemophilus influenzae NOT DETECTED NOT DETECTED Final   Neisseria meningitidis NOT DETECTED NOT DETECTED Final   Pseudomonas aeruginosa NOT DETECTED NOT DETECTED Final   Stenotrophomonas maltophilia NOT DETECTED NOT DETECTED Final   Candida albicans NOT DETECTED NOT DETECTED Final   Candida auris NOT DETECTED NOT DETECTED Final   Candida glabrata NOT DETECTED NOT DETECTED Final   Candida krusei NOT DETECTED NOT DETECTED Final   Candida parapsilosis NOT DETECTED NOT DETECTED Final   Candida tropicalis NOT DETECTED NOT DETECTED Final   Cryptococcus neoformans/gattii NOT DETECTED NOT DETECTED Final   Vancomycin resistance NOT DETECTED NOT DETECTED Final    Comment: Performed at [facility], [address] [place] [address] [place], [place] [address]  Urine Culture     Status: Abnormal   Collection Time: 08/14/22  6:54 AM   Specimen: Urine, Clean Catch  Result Value Ref Range Status   Specimen Description   Final    URINE, CLEAN CATCH Performed at [facility], [address] [place], [place] [address]    Special Requests   Final    NONE Performed at  [facility], [address] [place], [place] [address]    Culture >=100,000 COLONIES/mL ENTEROCOCCUS FAECALIS (A)  Final   Report Status 08/16/2022 FINAL  Final   Organism ID, Bacteria ENTEROCOCCUS FAECALIS (A)  Final      Susceptibility   Enterococcus faecalis - MIC*    AMPICILLIN <=2 SENSITIVE Sensitive     NITROFURANTOIN <=16 SENSITIVE Sensitive     VANCOMYCIN 1 SENSITIVE Sensitive     * >=100,000 COLONIES/mL ENTEROCOCCUS FAECALIS  Culture, blood (Routine X 2) w  Reflex to ID Panel     Status: None   Collection Time: 08/15/22 12:22 PM   Specimen: Right Antecubital; Blood  Result Value Ref Range Status   Specimen Description RIGHT ANTECUBITAL  Final   Special Requests   Final    BOTTLES DRAWN AEROBIC AND ANAEROBIC Blood Culture adequate volume   Culture   Final    NO GROWTH 5 DAYS Performed at [facility], [address], [place], [place] [address]    Report Status 08/20/2022 FINAL  Final  Culture, blood (Routine X 2) w Reflex to ID Panel     Status: None   Collection Time: 08/15/22 12:22 PM   Specimen: BLOOD RIGHT ARM  Result Value Ref Range Status   Specimen Description BLOOD RIGHT ARM  Final   Special Requests   Final    BOTTLES DRAWN AEROBIC AND ANAEROBIC Blood Culture adequate volume   Culture   Final    NO GROWTH 5 DAYS Performed at [facility], [address] [place], [place] [address]    Report Status 08/20/2022 FINAL  Final    Coagulation Studies: Recent Labs    08/17/22 1314  LABPROT 14.9  INR 1.2     Urinalysis: No results for input(s): "COLORURINE", "LABSPEC", "PHURINE", "GLUCOSEU", "HGBUR", "BILIRUBINUR", "KETONESUR", "PROTEINUR", "UROBILINOGEN", "NITRITE", "LEUKOCYTESUR" in the last 72 hours.  Invalid input(s): "APPERANCEUR"     Imaging: No results found.   Medications:    sodium chloride 100 mL/hr at 08/20/22 0844   ampicillin (OMNIPEN) IV 2 g (08/20/22 0850)    atorvastatin  40 mg Oral QHS   calcitRIOL   0.25 mcg Oral q AM   clopidogrel  75 mg Oral Daily   donepezil  10 mg Oral QHS   ezetimibe  10 mg Oral QPM   ferrous sulfate  325 mg Oral Q2000   heparin injection (subcutaneous)  5,000 Units Subcutaneous Q8H   ipratropium  2-4 spray Each Nare QHS   lidocaine  1 patch Transdermal Q24H   loratadine  10 mg Oral Daily   melatonin  10 mg Oral QHS   methocarbamol  500 mg Oral Q2000   metoprolol succinate  25 mg Oral Daily   multivitamin with minerals  1 tablet Oral QPC lunch   oxyCODONE-acetaminophen  2 tablet Oral Q6H   pantoprazole  40 mg Oral QAC breakfast   polyethylene glycol  17 g Oral BID   predniSONE  40 mg Oral Q breakfast   senna-docusate  2 tablet Oral BID   zolpidem  10 mg Oral QHS   albuterol, ALPRAZolam, dextromethorphan-guaiFENesin, hydrALAZINE, HYDROmorphone (DILAUDID) injection, methocarbamol, ondansetron (ZOFRAN) IV  Assessment/ Plan:  81 y.o. 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, history of stroke   admitted on 08/14/2022 for SOB (shortness of breath) [R06.02] Bronchitis [J40] Elevated troponin [R79.89] COPD exacerbation (HCC) [J44.1] CAP (community acquired pneumonia) [J18.9] Generalized weakness [R53.1] AKI (acute kidney injury) ([place]) [N17.9] Acute midline low back pain without sciatica [M54.50]  Hypertensive chronic kidney disease, CKD stage IV Kidney function is at baseline.  Urinalysis shows small proteinuria.  Concern about IV contrast exposure causing worsening of kidney function.  Overall this is a difficult situation.  Patient does require proper assessment of aorta (which necessitates IV contrast exposure) prior to planning endovascular repair. 2D echo from 06/14/2022 shows LVEF 60 to 32%, grade 1 diastolic dysfunction  Plan: Creatinine stable.  No endocarditis seen  on TEE.  Primary team to continue IV antibiotics.  Cardiology has signed off    LOS: [place] 10/13/202311:06 [place], [place]  Note: This note was prepared with Dragon dictation. Any transcription errors are unintentional

## 2022-08-20 NOTE — Progress Notes (Signed)
ID Pt still in low back pain with muscle spasms Unable to sit up    O/e Awake and alert Patient Vitals for the past 24 hrs:  BP Temp Temp src Pulse Resp SpO2  08/20/22 1151 (!) 158/95 98.1 F (36.7 C) -- 93 16 97 %  08/20/22 0827 (!) 171/81 -- -- 85 (!) 22 100 %  08/20/22 0438 (!) 144/69 -- -- 70 -- --  08/20/22 0337 (!) 172/70 98.6 F (37 C) Oral 69 20 98 %  08/20/22 0026 (!) 156/70 -- -- -- -- --  08/19/22 2023 (!) 178/82 98 F (36.7 C) Oral 69 16 98 %   Chest b/l air entry [id number] Pacemaker Abd soft CNS moves all extremities  Labs    Latest Ref Rng & Units 08/18/2022    4:06 AM 08/17/2022    5:30 AM 08/16/2022    4:03 AM  CBC  WBC 4.0 - 10.5 K/uL 7.2  5.5  5.4   Hemoglobin 13.0 - 17.0 g/dL 9.4  8.0  7.9   Hematocrit 39.0 - 52.0 % 30.0  26.1  25.4   Platelets 150 - 400 K/uL 193  136  155        Latest Ref Rng & Units 08/20/2022    6:06 AM 08/18/2022    4:06 AM 08/17/2022    5:30 AM  CMP  Glucose 70 - 99 mg/dL  115  88   BUN 8 - 23 mg/dL  30  28   Creatinine 0.61 - 1.24 mg/dL 2.05  2.05  2.06   Sodium 135 - 145 mmol/L  141  138   Potassium 3.5 - 5.1 mmol/L  3.9  3.9   Chloride 98 - 111 mmol/L  109  109   CO2 22 - 32 mmol/L  20  22   Calcium 8.9 - 10.3 mg/dL  8.8  8.4   Total Protein 6.5 - 8.1 g/dL   5.2   Total Bilirubin 0.3 - 1.2 mg/dL   0.7   Alkaline Phos 38 - 126 U/L   52   AST 15 - 41 U/L   16   ALT 0 - 44 U/L   15     Micro BC- 08/14/22 Enterococcis UC - enterococcus 08/15/22 BC   Impression/recommendation  Severe Low back pain- has previous L5-s1 fusion Need to r/o discitis. Epidural abscess or osteo PT needs MRI but he has pacemaker , so he will need to [facility]  Pts wife will decide after talking to her daughter tomorrow   Enterococcus bacteremia-  Concern for endocarditis due to pacemaker and also discitis/osteo Source could be the urinary trract as urine culture positive for enterococcus , . HE has a h/o radical prostatectomy TEE  neg for endocarditis On  ampicillin Bladder scan around 270 cc     AAA- s/p endovascular stent- worsening aneurysm with endo leak 2. 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

## 2022-08-20 NOTE — Progress Notes (Signed)
OT Cancellation Note  Patient Details Name: Rick Mcbride. MRN: [id number] DOB: Oct 22, 1941   Cancelled Treatment:    Reason Eval/Treat Not Completed: Other (comment) (RN cleared pt for participation, pt is premedicated, pt and wife decline mobility/intervention on this date due to significant pain. OT will reattempt as able.Shanon Payor, OTD OTR/L  08/20/22, 9:45 AM

## 2022-08-20 NOTE — Progress Notes (Addendum)
PROGRESS NOTE    Rick Mcbride.  YIF:[id number] DOB: August 06, 1941 DOA: 08/14/2022 PCP: Idelle Crouch, MD    Brief Narrative:  Rick Mcbride. is a 81 y.o. 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 Patient had surgery and finished SNF rehab. Currently he is doing PT/OT at home), chronic lower back pain (L5-S1 fusion), who presents with lower back pain and shortness breath. Pt had negative PE by V/Q scan which is ordered by his PCP day PTA, which was done for persistent SOB postoperatively. 10/07: WBC 3.8, negative COVID PCR, lactic acid 0.9, troponin level 31, 30, slightly worsening renal function with creatinine 2.69, BUN 31, GFR 23 (recent baseline creatinine 2.39 06/15/2022), temperature normal, blood pressure 172/84, 129/64, RR 83, heart rate 83, RR 17, oxygen saturation 98% on room air.  Chest x-ray showed possible mild patchy infiltration in left lower lobe.  CT head negative.  CT scan showed enlargement of AAA from previous 6.9 to 7.6 cm now.  Patient is admitted to telemetry bed as inpatient. Dr. Lorenso Courier of [facility] was consulted re: AAA, needs CTA, nephrology consulted and recommended IV fluids prior to CTA of Abd/Pelvis. Cardiology also consulted for surgical optimization/risk stratification - medically optimized at this time, troponin mild elevation likely d/t CKD4. BCx (+) E Faecalis, ID recommended r/o endocarditis/pacer infection w/ TEE and vascular surgery to assess re: graft involvement, changed Abx to ampicillin and repeat Bcx, also recommend MRI L-spine or CT w/ contrast, pt has pacemaker   10/08:  Pt and wife agreeable to TEE and MRI, though MRI would have to be at [facility]. Per vascular: "Noted bacteremia- however, patient with chronic progressive lower back pain; expanding aortic sac may be contributory to pain- not definitive. Therefore, preferable to intervene sooner than later." Per ID, would consider in order of importance to 1) treat bacteremia, 2) address aorto-bifem leak, 3) TEE, 4) MRI/CT L-spine. IF we decide to do CT, this can be done in-house, vs RI would require transfer. Will plan to proceed w/ IV abx, do vascular procedure tomorrow as scheduled, and see how he progresses from there.  10/09: Vascular procedure today to fix graft.  Radiology reached out this morning, concern for small PE noted on CTA abdomen/pelvis.  Holding off on full CTA chest at this time due to concern with contrast/renal function but will plan for heparin. 10/10: Renal function okay. CT w/ contrast lumbar spine - no osteomyelitis noted. CTA chest no PE but Significant mucus within RIGHT mainstem bronchus extending into bronchus intermedius and RIGHT lower lobe. Continue IV fluids and recheck BMP in AM. TEE planned for 10/12.  10/11 TEE plan for tomorrow. Palliative consulted 10/12 TEE this am. [facility] back. 10/13 c/o back pain  Consultants:  Vascular surgery - TAA, AAA enlarged, R common iliac aneurysm Nephrology - CKD4 needing contrast study Cardiology - clearance for vascular procedure given CAD/CABG, Chronic HFpEF ID - (+)Bcx  Procedures: 08/16/2022: Repair of aortic aneurysm sac type II endoleak  Antimicrobials:  Ampicillin Ceftriaxone   Subjective: No new complaints. No sob, cp  Objective: Vitals:   08/20/22 8676 08/20/22 7209  08/20/22 0827 08/20/22 1151  BP: (!) 172/70 (!) 144/69 (!) 171/81 (!) 158/95  Pulse: 69 70 85 93  Resp: 20  (!) 22 16  Temp: 98.6 F (37 C)   98.1 F (36.7 C)  TempSrc: Oral     SpO2: 98%  100% 97%  Weight:      Height:         Intake/Output Summary (Last 24 hours) at 08/20/2022 1455 Last data filed at 08/20/2022 1405 Gross per 24 hour  Intake 2411.35 ml  Output 2100 ml  Net 311.35 ml   Filed Weights   08/16/22 0226 08/19/22 0512 08/19/22 0748  Weight: 86.1 kg 93.4 kg 93.4 kg    Examination: Calm, NAD Cta no w/r Reg s1/s2 no gallop Soft benign +bs No edema Awake and alert Mood and affect appropriate in current setting    Data Reviewed: I have personally reviewed following labs and imaging studies  CBC: Recent Labs  Lab 08/14/22 0247 08/15/22 0440 08/16/22 0403 08/17/22 0530 08/18/22 0406  WBC 3.8* 4.4 5.4 5.5 7.2  HGB 8.6* 7.8* 7.9* 8.0* 9.4*  HCT 28.6* 25.5* 25.4* 26.1* 30.0*  MCV 92.9 90.7 91.0 91.3 88.8  PLT 136* 149* 155 136* 607   Basic Metabolic Panel: Recent Labs  Lab 08/14/22 0247 08/15/22 0440 08/16/22 0403 08/17/22 0530 08/18/22 0406 08/20/22 0606  NA 137 137 138 138 141  --   K 4.6 4.3 4.5 3.9 3.9  --   CL 103 108 109 109 109  --   CO2 24 24 22 22  20*  --   GLUCOSE 104* 104* 106* 88 115*  --   BUN 31* 32* 30* 28* 30*  --   CREATININE 2.69* 2.33* 2.10* 2.06* 2.05* 2.05*  CALCIUM 9.1 8.7* 8.6* 8.4* 8.8*  --    GFR: Estimated Creatinine Clearance: 33 mL/min (A) (by C-G formula based on SCr of 2.05 mg/dL (H)). Liver Function Tests: Recent Labs  Lab 08/17/22 0530  AST 16  ALT 15  ALKPHOS 52  BILITOT 0.7  PROT 5.2*  ALBUMIN 2.7*   No results for input(s): "LIPASE", "AMYLASE" in the last 168 hours. No results for input(s): "AMMONIA" in the last 168 hours. Coagulation Profile: Recent Labs  Lab 08/14/22 1958 08/17/22 1314  INR 1.1 1.2   Cardiac Enzymes: No results for input(s): "CKTOTAL", "CKMB", "CKMBINDEX", "TROPONINI" in the last 168 hours. BNP (last 3 results) No results for input(s): "PROBNP" in the last 8760 hours. HbA1C: No results for input(s): "HGBA1C" in the last 72 hours. CBG: No results for input(s): "GLUCAP" in the last 168  hours. Lipid Profile: No results for input(s): "CHOL", "HDL", "LDLCALC", "TRIG", "CHOLHDL", "LDLDIRECT" in the last 72 hours. Thyroid Function Tests: No results for input(s): "TSH", "T4TOTAL", "FREET4", "T3FREE", "THYROIDAB" in the last 72 hours. Anemia Panel: No results for input(s): "VITAMINB12", "FOLATE", "FERRITIN", "TIBC", "IRON", "RETICCTPCT" in the last 72 hours. Sepsis Labs: Recent Labs  Lab 08/14/22 0247 08/14/22 0357  PROCALCITON 0.37  --   LATICACIDVEN  --  0.9    Recent Results (from the past 240 hour(s))  Culture, blood (routine x 2)     Status: Abnormal   Collection Time: 08/14/22  3:57 AM   Specimen: BLOOD  Result Value Ref Range Status   Specimen Description   Final    BLOOD BLOOD RIGHT ARM Performed at [facility], [address], [place], [place] [address]    Special Requests   Final    BOTTLES DRAWN AEROBIC AND  ANAEROBIC Blood Culture adequate volume Performed at [facility], [address] [place], [place] [address]    Culture  Setup Time   Final    Organism ID to follow GRAM POSITIVE COCCI IN BOTH AEROBIC AND ANAEROBIC BOTTLES CRITICAL RESULT CALLED TO, READ BACK BY AND VERIFIED WITH: PHARMD CHILDS AT 1957 08/14/2022 GAA Performed at [facility], [address] [place], [place] [address]    Culture ENTEROCOCCUS FAECALIS (A)  Final   Report Status 08/17/2022 FINAL  Final   Organism ID, Bacteria ENTEROCOCCUS FAECALIS  Final      Susceptibility   Enterococcus faecalis - MIC*    AMPICILLIN <=2 SENSITIVE Sensitive     VANCOMYCIN 1 SENSITIVE Sensitive     GENTAMICIN SYNERGY SENSITIVE Sensitive     * ENTEROCOCCUS FAECALIS  Culture, blood (routine x 2)     Status: Abnormal   Collection Time: 08/14/22  3:57 AM   Specimen: BLOOD  Result Value Ref Range Status   Specimen Description   Final    BLOOD BLOOD LEFT ARM Performed at [facility], [address], [place], [place] [address]    Special Requests   Final     BOTTLES DRAWN AEROBIC AND ANAEROBIC Blood Culture adequate volume Performed at [facility] [address] [place], [place] [address]    Culture  Setup Time   Final    GRAM POSITIVE COCCI AEROBIC BOTTLE ONLY CRITICAL RESULT CALLED TO, READ BACK BY AND VERIFIED WITH: PHARMD CHILDS AT [place] 08/14/2022 GAA GRAM STAIN REVIEWED-AGREE WITH RESULT Performed at [facility], [address] [place], [place] [address]    Culture (A)  Final    ENTEROCOCCUS FAECALIS SUSCEPTIBILITIES PERFORMED ON PREVIOUS CULTURE WITHIN THE LAST 5 DAYS. Performed at [facility], [place] [address], [place],  [address]    Report Status 08/17/2022 FINAL  Final  Resp Panel by RT-PCR (Flu A&B, Covid) Anterior Nasal Swab     Status: None   Collection Time: 08/14/22  3:57 AM   Specimen: Anterior Nasal Swab  Result Value Ref Range Status   SARS Coronavirus 2 by RT PCR NEGATIVE NEGATIVE Final    Comment: (NOTE) SARS-CoV-2 target nucleic acids are NOT DETECTED.  The SARS-CoV-2 RNA is generally detectable in upper respiratory specimens during the acute phase of infection. The lowest concentration of SARS-CoV-2 viral copies this assay can detect is 138 copies/mL. A negative result does not preclude SARS-Cov-2 infection and should not be used as the sole basis for treatment or other patient management decisions. A negative result may occur with  improper specimen collection/handling, submission of specimen other than nasopharyngeal swab, presence of viral mutation(s) within the areas targeted by this assay, and inadequate number of viral copies(<138 copies/mL). A negative result must be combined with clinical observations, patient history, and epidemiological information. The expected result is Negative.  Fact Sheet for Patients:  EntrepreneurPulse.com.au  Fact Sheet for Healthcare Providers:  IncredibleEmployment.be  This test is no t yet  approved or cleared by the [place] FDA and  has been authorized for detection and/or diagnosis of SARS-CoV-2 by FDA under an Emergency Use Authorization (EUA). This EUA will remain  in effect (meaning this test can be used) for the duration of the COVID-19 declaration under Section 564(b)(1) of the Act, 21 U.S.C.section 360bbb-3(b)(1), unless the authorization is terminated  or revoked sooner.       Influenza A by PCR NEGATIVE NEGATIVE Final   Influenza B by PCR  NEGATIVE NEGATIVE Final    Comment: (NOTE) The Xpert Xpress SARS-CoV-2/FLU/RSV plus assay is intended as an aid in the diagnosis of influenza from Nasopharyngeal swab specimens and should not be used as a sole basis for treatment. Nasal washings and aspirates are unacceptable for Xpert Xpress SARS-CoV-2/FLU/RSV testing.  Fact Sheet for Patients: EntrepreneurPulse.com.au  Fact Sheet for Healthcare Providers: IncredibleEmployment.be  This test is not yet approved or cleared by the [place] FDA and has been authorized for detection and/or diagnosis of SARS-CoV-2 by FDA under an Emergency Use Authorization (EUA). This EUA will remain in effect (meaning this test can be used) for the duration of the COVID-19 declaration under Section 564(b)(1) of the Act, 21 U.S.C. section 360bbb-3(b)(1), unless the authorization is terminated or revoked.  Performed at [facility], [address] [place], [place] [address]   Blood Culture ID Panel (Reflexed)     Status: Abnormal   Collection Time: 08/14/22  3:57 AM  Result Value Ref Range Status   Enterococcus faecalis DETECTED (A) NOT DETECTED Final    Comment: CRITICAL RESULT CALLED TO, READ BACK BY AND VERIFIED WITH: PHARMD CHILDS AT 1957 08/14/2022 GAA    Enterococcus Faecium NOT DETECTED NOT DETECTED Final   Listeria monocytogenes NOT DETECTED NOT DETECTED Final   Staphylococcus species NOT DETECTED NOT DETECTED Final    Staphylococcus aureus (BCID) NOT DETECTED NOT DETECTED Final   Staphylococcus epidermidis NOT DETECTED NOT DETECTED Final   Staphylococcus lugdunensis NOT DETECTED NOT DETECTED Final   Streptococcus species NOT DETECTED NOT DETECTED Final   Streptococcus agalactiae NOT DETECTED NOT DETECTED Final   Streptococcus pneumoniae NOT DETECTED NOT DETECTED Final   Streptococcus pyogenes NOT DETECTED NOT DETECTED Final   A.calcoaceticus-baumannii NOT DETECTED NOT DETECTED Final   Bacteroides fragilis NOT DETECTED NOT DETECTED Final   Enterobacterales NOT DETECTED NOT DETECTED Final   Enterobacter cloacae complex NOT DETECTED NOT DETECTED Final   Escherichia coli NOT DETECTED NOT DETECTED Final   Klebsiella aerogenes NOT DETECTED NOT DETECTED Final   Klebsiella oxytoca NOT DETECTED NOT DETECTED Final   Klebsiella pneumoniae NOT DETECTED NOT DETECTED Final   Proteus species NOT DETECTED NOT DETECTED Final   Salmonella species NOT DETECTED NOT DETECTED Final   Serratia marcescens NOT DETECTED NOT DETECTED Final   Haemophilus influenzae NOT DETECTED NOT DETECTED Final   Neisseria meningitidis NOT DETECTED NOT DETECTED Final   Pseudomonas aeruginosa NOT DETECTED NOT DETECTED Final   Stenotrophomonas maltophilia NOT DETECTED NOT DETECTED Final   Candida albicans NOT DETECTED NOT DETECTED Final   Candida auris NOT DETECTED NOT DETECTED Final   Candida glabrata NOT DETECTED NOT DETECTED Final   Candida krusei NOT DETECTED NOT DETECTED Final   Candida parapsilosis NOT DETECTED NOT DETECTED Final   Candida tropicalis NOT DETECTED NOT DETECTED Final   Cryptococcus neoformans/gattii NOT DETECTED NOT DETECTED Final   Vancomycin resistance NOT DETECTED NOT DETECTED Final    Comment: Performed at [facility], [address] [place], [place] [address]  Urine Culture     Status: Abnormal   Collection Time: 08/14/22  6:54 AM   Specimen: Urine, Clean Catch  Result Value Ref Range Status   Specimen  Description   Final    URINE, CLEAN CATCH Performed at [facility], [address], [place], [place] [address]    Special Requests   Final    NONE Performed at [facility], [address], [place], [place] [address]    Culture >=100,000 COLONIES/mL  ENTEROCOCCUS FAECALIS (A)  Final   Report Status 08/16/2022 FINAL  Final   Organism ID, Bacteria ENTEROCOCCUS FAECALIS (A)  Final      Susceptibility   Enterococcus faecalis - MIC*    AMPICILLIN <=2 SENSITIVE Sensitive     NITROFURANTOIN <=16 SENSITIVE Sensitive     VANCOMYCIN 1 SENSITIVE Sensitive     * >=100,000 COLONIES/mL ENTEROCOCCUS FAECALIS  Culture, blood (Routine X 2) w Reflex to ID Panel     Status: None   Collection Time: 08/15/22 12:22 PM   Specimen: Right Antecubital; Blood  Result Value Ref Range Status   Specimen Description RIGHT ANTECUBITAL  Final   Special Requests   Final    BOTTLES DRAWN AEROBIC AND ANAEROBIC Blood Culture adequate volume   Culture   Final    NO GROWTH 5 DAYS Performed at [facility], [address] [place], [place] [address]    Report Status 08/20/2022 FINAL  Final  Culture, blood (Routine X 2) w Reflex to ID Panel     Status: None   Collection Time: 08/15/22 12:22 PM   Specimen: BLOOD RIGHT ARM  Result Value Ref Range Status   Specimen Description BLOOD RIGHT ARM  Final   Special Requests   Final    BOTTLES DRAWN AEROBIC AND ANAEROBIC Blood Culture adequate volume   Culture   Final    NO GROWTH 5 DAYS Performed at [facility], [address] [place] [address], [place], [place] [address]    Report Status 08/20/2022 FINAL  Final         Radiology Studies: No results found.      Scheduled Meds:  amLODipine  5 mg Oral Daily   atorvastatin  40 mg Oral QHS   calcitRIOL  0.25 mcg Oral q AM   clopidogrel  75 mg Oral Daily   donepezil  10 mg Oral QHS   ezetimibe  10 mg Oral QPM   ferrous sulfate  325 mg Oral Q2000   heparin injection (subcutaneous)  5,000  Units Subcutaneous Q8H   ipratropium  2-4 spray Each Nare QHS   lidocaine  1 patch Transdermal Q24H   loratadine  10 mg Oral Daily   melatonin  10 mg Oral QHS   methocarbamol  500 mg Oral TID   metoprolol succinate  25 mg Oral Daily   multivitamin with minerals  1 tablet Oral QPC lunch   oxyCODONE-acetaminophen  2 tablet Oral Q6H   pantoprazole  40 mg Oral QAC breakfast   polyethylene glycol  17 g Oral BID   predniSONE  40 mg Oral Q breakfast   senna-docusate  2 tablet Oral BID   zolpidem  10 mg Oral QHS   Continuous Infusions:  sodium chloride 100 mL/hr at 08/20/22 0844   ampicillin (OMNIPEN) IV 2 g (08/20/22 0850)    Assessment & Plan:   Principal Problem:   Complication of aortic graft Active Problems:   COPD exacerbation (HCC)   AAA (abdominal aortic aneurysm) without rupture (HCC)   Thoracic aortic aneurysm without rupture ([place])   Aneurysm of right common iliac artery (HCC)   CAD S/P CABG x 3   Myocardial injury   Essential hypertension   Chronic diastolic CHF (congestive heart failure) (HCC)   Chronic kidney disease, stage IV (severe) (HCC)   HLD (hyperlipidemia)   Iron deficiency anemia   TIA (transient ischemic attack)   Mild cognitive impairment   Low back pain   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  [place])   Obesity (BMI 30-39.9)   Anxiety   Bacteremia due to Enterococcus   Acute pulmonary embolism (HCC)   Mucus plugging of bronchi   Lumbar degenerative disc disease   Arthritis of lumbar spine   Type II aortic aneurysm endoleak AAA (abdominal aortic aneurysm) without rupture [facility]) Thoracic aortic aneurysm without rupture (HCC) Aneurysm of right common iliac artery (HCC) S/p of AAA repair 2018 by Dr. Lucky Cowboy. Now the size has enlarged from 6.9 to 7.6 cm 10/13 status post repair on 10/9 by Dr. Lucky Cowboy        Severe back pain Lumbar OA, DDD Multiple lobar spine hardware 10/11 ct spine with degenerative disc dz, no acute issues 10/12 declined  MRI of spine to be [facility] at [facility].  Wife stated he is too weak to be being transferred and they have opted not to be transferred anywhere for any kind of imaging 10/13 pain controlled Change Robxin 500mg  tid    CAD S/P CABG x 3 Hx of CAD s/p of CABG and myocardial injury due to COPD exacerbation:  Myocardial injury less likely vs Troponin elevation d/t CKD4 Troponin level 31, 30.  Denies chest pain. Continue home Lipitor, Zetia hold ASA consulted Dr. Saralyn Pilar of cardiology per Dr. Dolores Hoose recommendation - optimized from cardiac standpoint, though risk is present. Troponins likely d/t CKD 10/13 TEE results pending    Low back pain This seems to be a chronic issue, has worsened recently. 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 There is osteopenia degenerative change of the lumbar spine with discogenic mild grade 1 retrolisthesis unchanged at L1-2 and L2-3, slight dextroscoliosis. pain control: As needed Percocet, Tylenol, Dilaudid --> scheduled po opiates w/ prn Dilaudid As needed Robaxin Lidoderm MRI L Spine per ID recs given (+)Bcx -patient has pacemaker, family would like to avoid transferring to different facility if possible.  10/13 Robxin change to standing dose. Pt and [facility]. Discussing hospice.   SOB w/ mucus plugging of bronchi noted on CT PE resolved on CTA (recently diagnosed, see above)  Transition heparin to DVT ppx dose now that no PE Given SOB, may consider pulmonary consult to review CT and eval if bronchoscopy warranted.., however family deciding on snf with hospice    Chronic kidney disease, stage IV (severe) ([place]) Slightly worsening than baseline. Today 08/17/22 Cr 2.06 (vs 2.69 on admission) Monitor renal function closely by [facility]rology following - see note for recs re: contrast administration  Planning further contrast studies to eval PE and lumbar  spine - increased fluids for now to 100 mL/h and plan to reduce this pending renal function tomorrow.    Positive blood cultures - E Facealis Bacteremia ID to follow Ampicillin 10/12 TEE today, results pending   Possible COPD exacerbation ([place]) - seems less likely given CT findings from today 10/10 see above Patient is a former smoker, smoked more than 20 years.  CT chest did not show focal infiltration, does not seem to have pneumonia.  Procalcitonin elevated at 0.39.   Patient does not meet criteria for sepsis. will admit to tele bed as inpatient Bronchodilators pt was given 80 mg of Solu-Medrol in ED --> will change to oral prednisone 40 mg daily Z pak  Mucinex for cough  Incentive spirometry Urine S. Pneumococcal and legionella antigen sputum culture Nasal cannula oxygen as needed to maintain O2 saturation 93% or greater if pt desat Consider pulmonary consult  Essential hypertension IV hydralazine as needed Metoprolol hold Cozaar since pt will receive contrast  10/13 add amlodipine.   Chronic diastolic CHF (congestive heart failure) (HCC) 2D echo on 06/14/2022 showed EF of 60 to 65% with grade 1 diastolic dysfunction.   CHF seem to be compensated.    HLD (hyperlipidemia) Continue to Lipitor and zetia   Iron deficiency anemia Hemoglobin 8.6 (9.7 on 06/15/2022, and 8.6 on 06/13/2022), no active bleeding. Follow-up with CBC Continue iron supplement   History of TIA (transient ischemic attack) Continue Lipitor Hold ASA and Plavix with pending vascular surgeon's evaluation of his AAA.   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  No acute issues.   Obesity (BMI 30-39.9) BMI= 30.68  and BW= 99.8 Diet and exercise.   Encourage to lose weight.   Anxiety start prn Xanax 0.25 mg bid     DVT prophylaxis: holding pharmacologic given bleed risk , scd Code  Status:dnr Family Communication: wife Disposition Plan:  Status is: Inpatient Remains inpatient appropriate because: iv treatment,with back pain. Having family decision on goal of care       LOS: 6 days   Time spent: 35 min    Nolberto Hanlon, MD [facility] Pager [phone]  If 7PM-7AM, please contact night-coverage 08/20/2022, 2:55 PM

## 2022-08-20 NOTE — Progress Notes (Signed)
Palliative: [name], [place], is lying quietly in bed.  He appears acutely/chronically ill and frail.  He greets me, making and mostly keeping eye contact.  Although he has known memory loss, he is oriented to person, place, situation if given enough time.  I believe that he can make his basic needs known.  His wife of 7 years, Rick Mcbride, is present at bedside.  We talk in detail about what is next for [name].  As I mentioned that we are respecting that he would want no life support he starts to become tearful.  We talk about his choices for direction of care.  I ask if he feels that he is capable of going to rehab, and he tells me that he cannot participate in rehab.  His [name] states that she did not feel that he was given proper rehab after his hip fracture and repair.  WT shares that he is having a lot of pain in his lower back.  We talk about the benefits of hospice care including, but not limited to hospice care at home with the limitations, hospice care in a private pay nursing facility, and hospice care at the residential hospice.  Rick Mcbride shares that she understands that insurance will cover 30 days in the nursing home at no cost.  I share that he must participate in physical therapy in order for the state to be covered.  Mr. and Mrs. Risdon share that they are waiting for their daughter, Rick Mcbride and her husband Rick Mcbride to arrive tomorrow.  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  Several times during our conversation WT will state that taking care of his family is the most important thing.  [name] son is special needs and in a group home.  I shared that he is in a special time of life and it is also important to take care of him.  He is tearful throughout our conversation.  Conference with attending, bedside nursing staff, transition of care team, [facility] inpatient representative related to patient condition, needs, goals of care, disposition.  Plan: At this point continue to  treat the treatable but no CPR or intubation.  Daughter is arriving 10/14 for family visit to discuss Rick Mcbride.  [facility] representative to follow.  44 minutes Quinn Axe, NP Palliative medicine team Team phone [phone] Greater than 50% of this time was spent counseling and coordinating care related to the above assessment and plan.

## 2022-08-20 NOTE — Progress Notes (Signed)
PT Cancellation Note  Patient Details Name: Rick Mcbride. MRN: [id number] DOB: 02/16/1941   Cancelled Treatment:    Reason Eval/Treat Not Completed: Pain limiting ability to participate. Orders received and chart reviewed. Coordinated with RN with pain management due to LBP this a.m. PT/OT entering room with wife present. Despite pain management, pt reporting significant LBP. Wants to participate with therapy but declines even attempting sitting EOB with 2 person assist. Educated on current capacity of hospital and rehab will re-attempt as able. Pt and wife at bedside understanding.   Salem Caster. Fairly IV, PT, DPT Physical Therapist- [facility]  08/20/2022, 9:23 AM

## 2022-08-20 NOTE — Care Management Important Message (Signed)
Important Message  Patient Details  Name: Rick Mcbride. MRN: [id number] Date of Birth: July 28, 1941   Medicare Important Message Given:  Yes     Dannette Barbara 08/20/2022, 1:46 PM

## 2022-08-21 DIAGNOSIS — I723 Aneurysm of iliac artery: Secondary | ICD-10-CM | POA: Diagnosis not present

## 2022-08-21 DIAGNOSIS — I2699 Other pulmonary embolism without acute cor pulmonale: Secondary | ICD-10-CM | POA: Diagnosis not present

## 2022-08-21 DIAGNOSIS — I714 Abdominal aortic aneurysm, without rupture, unspecified: Secondary | ICD-10-CM | POA: Diagnosis not present

## 2022-08-21 DIAGNOSIS — T829XXA Unspecified complication of cardiac and vascular prosthetic device, implant and graft, initial encounter: Secondary | ICD-10-CM | POA: Diagnosis not present

## 2022-08-21 MED ORDER — AMLODIPINE BESYLATE 10 MG PO TABS
10.0000 mg | ORAL_TABLET | Freq: Every day | ORAL | Status: DC
  Administered 2022-08-22 – 2022-09-01 (×11): 10 mg via ORAL
  Filled 2022-08-21 (×11): qty 1

## 2022-08-21 MED ORDER — METHOCARBAMOL 500 MG PO TABS
500.0000 mg | ORAL_TABLET | Freq: Once | ORAL | Status: AC
  Administered 2022-08-21: 500 mg via ORAL
  Filled 2022-08-21: qty 1

## 2022-08-21 MED ORDER — ACETAMINOPHEN 500 MG PO TABS
1000.0000 mg | ORAL_TABLET | Freq: Four times a day (QID) | ORAL | Status: DC | PRN
  Administered 2022-08-21: 1000 mg via ORAL
  Filled 2022-08-21: qty 2

## 2022-08-21 MED ORDER — AMLODIPINE BESYLATE 5 MG PO TABS
5.0000 mg | ORAL_TABLET | Freq: Once | ORAL | Status: AC
  Administered 2022-08-21: 5 mg via ORAL
  Filled 2022-08-21: qty 1

## 2022-08-21 NOTE — Progress Notes (Signed)
PROGRESS NOTE    Rick Mcbride.  ZOX:[id number] DOB: 1940/11/29 DOA: 08/14/2022 PCP: Idelle Crouch, MD    Brief Narrative:  Rick Mcbride. is a 81 y.o. 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 Patient had surgery and finished SNF rehab. Currently he is doing PT/OT at home), chronic lower back pain (L5-S1 fusion), who presents with lower back pain and shortness breath. Pt had negative PE by V/Q scan which is ordered by his PCP day PTA, which was done for persistent SOB postoperatively. 10/07: WBC 3.8, negative COVID PCR, lactic acid 0.9, troponin level 31, 30, slightly worsening renal function with creatinine 2.69, BUN 31, GFR 23 (recent baseline creatinine 2.39 06/15/2022), temperature normal, blood pressure 172/84, 129/64, RR 83, heart rate 83, RR 17, oxygen saturation 98% on room air.  Chest x-ray showed possible mild patchy infiltration in left lower lobe.  CT head negative.  CT scan showed enlargement of AAA from previous 6.9 to 7.6 cm now.  Patient is admitted to telemetry bed as inpatient. Dr. Lorenso Courier of [facility] was consulted re: AAA, needs CTA, nephrology consulted and recommended IV fluids prior to CTA of Abd/Pelvis. Cardiology also consulted for surgical optimization/risk stratification - medically optimized at this time, troponin mild elevation likely d/t CKD4. BCx (+) E Faecalis, ID recommended r/o endocarditis/pacer infection w/ TEE and vascular surgery to assess re: graft involvement, changed Abx to ampicillin and repeat Bcx, also recommend MRI L-spine or CT w/ contrast, pt has pacemaker   10/08:  Pt and wife agreeable to TEE and MRI, though MRI would have to be at [facility]. Per vascular: "Noted bacteremia- however, patient with chronic progressive lower back pain; expanding aortic sac may be contributory to pain- not definitive. Therefore, preferable to intervene sooner than later." Per ID, would consider in order of importance to 1) treat bacteremia, 2) address aorto-bifem leak, 3) TEE, 4) MRI/CT L-spine. IF we decide to do CT, this can be done in-house, vs RI would require transfer. Will plan to proceed w/ IV abx, do vascular procedure tomorrow as scheduled, and see how he progresses from there.  10/09: Vascular procedure today to fix graft.  Radiology reached out this morning, concern for small PE noted on CTA abdomen/pelvis.  Holding off on full CTA chest at this time due to concern with contrast/renal function but will plan for heparin. 10/10: Renal function okay. CT w/ contrast lumbar spine - no osteomyelitis noted. CTA chest no PE but Significant mucus within RIGHT mainstem bronchus extending into bronchus intermedius and RIGHT lower lobe. Continue IV fluids and recheck BMP in AM. TEE planned for 10/12.  10/11 TEE plan for tomorrow. Palliative consulted 10/12 TEE this am. [facility] declined any transfer to [facility] for imaging /MRI back. 10/13 c/o back pain 10/14 family had discussion with hospice and would like to proceed with MRI.told wife need to make sure pacer compatable with mri at [facility]  Consultants:  Vascular surgery - TAA, AAA enlarged, R common iliac aneurysm Nephrology - CKD4 needing contrast study Cardiology - clearance for vascular procedure given CAD/CABG, Chronic HFpEF ID - (+)Bcx  Procedures: 08/16/2022: Repair of aortic aneurysm sac type II  endoleak  Antimicrobials:  Ampicillin Ceftriaxone   Subjective: Still with back pain. No other new complaints. Wife asked about bp explained mx.   Objective: Vitals:   08/21/22 0108 08/21/22 0308 08/21/22 0359 08/21/22 0857  BP: (!)  161/88 (!) 166/81 (!) 168/79 130/75  Pulse: 94 99 (!) 102 (!) 107  Resp: 18 (!) 25 19 18   Temp:   98 F (36.7 C) 98.6 F (37 C)  TempSrc:      SpO2: 93% 95%  91%  Weight:      Height:        Intake/Output Summary (Last 24 hours) at 08/21/2022 0932 Last data filed at 08/21/2022 0600 Gross per 24 hour  Intake 2517.5 ml  Output 2000 ml  Net 517.5 ml   Filed Weights   08/16/22 0226 08/19/22 0512 08/19/22 0748  Weight: 86.1 kg 93.4 kg 93.4 kg    Examination: Calm, NAD Cta no w/r Reg s1/s2 no gallop Soft benign +bs Trace edema b/l Awake , alert Mood and affect appropriate in current setting    Data Reviewed: I have personally reviewed following labs and imaging studies  CBC: Recent Labs  Lab 08/15/22 0440 08/16/22 0403 08/17/22 0530 08/18/22 0406  WBC 4.4 5.4 5.5 7.2  HGB 7.8* 7.9* 8.0* 9.4*  HCT 25.5* 25.4* 26.1* 30.0*  MCV 90.7 91.0 91.3 88.8  PLT 149* 155 136* 505   Basic Metabolic Panel: Recent Labs  Lab 08/15/22 0440 08/16/22 0403 08/17/22 0530 08/18/22 0406 08/20/22 0606  NA 137 138 138 141  --   K 4.3 4.5 3.9 3.9  --   CL 108 109 109 109  --   CO2 24 22 22  20*  --   GLUCOSE 104* 106* 88 115*  --   BUN 32* 30* 28* 30*  --   CREATININE 2.33* 2.10* 2.06* 2.05* 2.05*  CALCIUM 8.7* 8.6* 8.4* 8.8*  --    GFR: Estimated Creatinine Clearance: 33 mL/min (A) (by C-G formula based on SCr of 2.05 mg/dL (H)). Liver Function Tests: Recent Labs  Lab 08/17/22 0530  AST 16  ALT 15  ALKPHOS 52  BILITOT 0.7  PROT 5.2*  ALBUMIN 2.7*   No results for input(s): "LIPASE", "AMYLASE" in the last 168 hours. No results for input(s): "AMMONIA" in the last 168 hours. Coagulation Profile: Recent Labs  Lab 08/14/22 1958 08/17/22 1314  INR 1.1 1.2   Cardiac Enzymes: No results for input(s): "CKTOTAL", "CKMB", "CKMBINDEX", "TROPONINI" in the last 168 hours. BNP (last 3 results) No results for input(s): "PROBNP" in the last 8760 hours. HbA1C: No results for  input(s): "HGBA1C" in the last 72 hours. CBG: No results for input(s): "GLUCAP" in the last 168 hours. Lipid Profile: No results for input(s): "CHOL", "HDL", "LDLCALC", "TRIG", "CHOLHDL", "LDLDIRECT" in the last 72 hours. Thyroid Function Tests: No results for input(s): "TSH", "T4TOTAL", "FREET4", "T3FREE", "THYROIDAB" in the last 72 hours. Anemia Panel: No results for input(s): "VITAMINB12", "FOLATE", "FERRITIN", "TIBC", "IRON", "RETICCTPCT" in the last 72 hours. Sepsis Labs: No results for input(s): "PROCALCITON", "LATICACIDVEN" in the last 168 hours.   Recent Results (from the past 240 hour(s))  Culture, blood (routine x 2)     Status: Abnormal   Collection Time: 08/14/22  3:57 AM   Specimen: BLOOD  Result Value Ref Range Status   Specimen Description   Final    BLOOD BLOOD RIGHT ARM Performed at [facility], [address] [place], [place] [address]    Special Requests  Final    BOTTLES DRAWN AEROBIC AND ANAEROBIC Blood Culture adequate volume Performed at [facility], [address] [place], [place] [address]    Culture  Setup Time   Final    Organism ID to follow GRAM POSITIVE COCCI IN BOTH AEROBIC AND ANAEROBIC BOTTLES CRITICAL RESULT CALLED TO, READ BACK BY AND VERIFIED WITH: PHARMD CHILDS AT 1957 08/14/2022 GAA Performed at [facility], [address] [place], [place] [address]    Culture ENTEROCOCCUS FAECALIS (A)  Final   Report Status 08/17/2022 FINAL  Final   Organism ID, Bacteria ENTEROCOCCUS FAECALIS  Final      Susceptibility   Enterococcus faecalis - MIC*    AMPICILLIN <=2 SENSITIVE Sensitive     VANCOMYCIN 1 SENSITIVE Sensitive     GENTAMICIN SYNERGY SENSITIVE Sensitive     * ENTEROCOCCUS FAECALIS  Culture, blood (routine x 2)     Status: Abnormal   Collection Time: 08/14/22  3:57 AM   Specimen: BLOOD  Result Value Ref Range Status   Specimen Description   Final    BLOOD BLOOD LEFT ARM Performed at [facility], [address] [place], [place] [address]    Special Requests   Final    BOTTLES DRAWN AEROBIC AND ANAEROBIC Blood Culture adequate volume Performed at [facility], [address] [place],  [address]    Culture  Setup Time   Final    GRAM POSITIVE COCCI AEROBIC BOTTLE ONLY CRITICAL RESULT CALLED TO, READ BACK BY AND VERIFIED WITH: PHARMD CHILDS AT [place] 08/14/2022 GAA GRAM STAIN REVIEWED-AGREE WITH RESULT Performed at [facility], [address] [place],  [address]    Culture (A)  Final    ENTEROCOCCUS FAECALIS SUSCEPTIBILITIES PERFORMED ON PREVIOUS CULTURE WITHIN THE LAST 5 DAYS. Performed at [facility], [place] [address] [place],  [address]    Report Status 08/17/2022 FINAL  Final  Resp Panel by RT-PCR (Flu A&B, Covid) Anterior Nasal Swab     Status: None   Collection Time: 08/14/22  3:57 AM   Specimen: Anterior Nasal Swab  Result Value Ref Range Status   SARS Coronavirus 2 by RT PCR NEGATIVE NEGATIVE Final    Comment: (NOTE) SARS-CoV-2 target nucleic acids are NOT DETECTED.  The SARS-CoV-2 RNA is generally detectable in upper respiratory specimens during the acute phase of infection. The lowest concentration of SARS-CoV-2 viral copies this assay can detect is 138 copies/mL. A negative result does not preclude SARS-Cov-2 infection and should not be used as the sole basis for treatment or other patient management decisions. A negative result may occur with  improper specimen collection/handling, submission of specimen other than nasopharyngeal swab, presence of viral mutation(s) within the areas targeted by this assay, and inadequate number of viral copies(<138 copies/mL). A negative result must be combined with clinical observations, patient history, and epidemiological information. The expected result is Negative.  Fact Sheet for Patients:  EntrepreneurPulse.com.au  Fact Sheet for Healthcare  Providers:  IncredibleEmployment.be  This test is no t yet approved or cleared by the [place] FDA and  has been authorized for detection and/or diagnosis of SARS-CoV-2 by FDA under an Emergency Use Authorization (EUA). This EUA will remain  in effect (meaning this test can be used) for the duration of the COVID-19 declaration under Section 564(b)(1) of the Act, 21 U.S.C.section 360bbb-3(b)(1), unless the authorization is terminated  or revoked sooner.       Influenza A by PCR NEGATIVE  NEGATIVE Final   Influenza B by PCR NEGATIVE NEGATIVE Final    Comment: (NOTE) The Xpert Xpress SARS-CoV-2/FLU/RSV plus assay is intended as an aid in the diagnosis of influenza from Nasopharyngeal swab specimens and should not be used as a sole basis for treatment. Nasal washings and aspirates are unacceptable for Xpert Xpress SARS-CoV-2/FLU/RSV testing.  Fact Sheet for Patients: EntrepreneurPulse.com.au  Fact Sheet for Healthcare Providers: IncredibleEmployment.be  This test is not yet approved or cleared by the [place] FDA and has been authorized for detection and/or diagnosis of SARS-CoV-2 by FDA under an Emergency Use Authorization (EUA). This EUA will remain in effect (meaning this test can be used) for the duration of the COVID-19 declaration under Section 564(b)(1) of the Act, 21 U.S.C. section 360bbb-3(b)(1), unless the authorization is terminated or revoked.  Performed at [facility], [address] [place], [place] [address]   Blood Culture ID Panel (Reflexed)     Status: Abnormal   Collection Time: 08/14/22  3:57 AM  Result Value Ref Range Status   Enterococcus faecalis DETECTED (A) NOT DETECTED Final    Comment: CRITICAL RESULT CALLED TO, READ BACK BY AND VERIFIED WITH: PHARMD CHILDS AT 1957 08/14/2022 GAA    Enterococcus Faecium NOT DETECTED NOT DETECTED Final   Listeria monocytogenes NOT DETECTED NOT  DETECTED Final   Staphylococcus species NOT DETECTED NOT DETECTED Final   Staphylococcus aureus (BCID) NOT DETECTED NOT DETECTED Final   Staphylococcus epidermidis NOT DETECTED NOT DETECTED Final   Staphylococcus lugdunensis NOT DETECTED NOT DETECTED Final   Streptococcus species NOT DETECTED NOT DETECTED Final   Streptococcus agalactiae NOT DETECTED NOT DETECTED Final   Streptococcus pneumoniae NOT DETECTED NOT DETECTED Final   Streptococcus pyogenes NOT DETECTED NOT DETECTED Final   A.calcoaceticus-baumannii NOT DETECTED NOT DETECTED Final   Bacteroides fragilis NOT DETECTED NOT DETECTED Final   Enterobacterales NOT DETECTED NOT DETECTED Final   Enterobacter cloacae complex NOT DETECTED NOT DETECTED Final   Escherichia coli NOT DETECTED NOT DETECTED Final   Klebsiella aerogenes NOT DETECTED NOT DETECTED Final   Klebsiella oxytoca NOT DETECTED NOT DETECTED Final   Klebsiella pneumoniae NOT DETECTED NOT DETECTED Final   Proteus species NOT DETECTED NOT DETECTED Final   Salmonella species NOT DETECTED NOT DETECTED Final   Serratia marcescens NOT DETECTED NOT DETECTED Final   Haemophilus influenzae NOT DETECTED NOT DETECTED Final   Neisseria meningitidis NOT DETECTED NOT DETECTED Final   Pseudomonas aeruginosa NOT DETECTED NOT DETECTED Final   Stenotrophomonas maltophilia NOT DETECTED NOT DETECTED Final   Candida albicans NOT DETECTED NOT DETECTED Final   Candida auris NOT DETECTED NOT DETECTED Final   Candida glabrata NOT DETECTED NOT DETECTED Final   Candida krusei NOT DETECTED NOT DETECTED Final   Candida parapsilosis NOT DETECTED NOT DETECTED Final   Candida tropicalis NOT DETECTED NOT DETECTED Final   Cryptococcus neoformans/gattii NOT DETECTED NOT DETECTED Final   Vancomycin resistance NOT DETECTED NOT DETECTED Final    Comment: Performed at [facility], [address], [place], [place] [address]  Urine Culture     Status: Abnormal   Collection Time: 08/14/22  6:54 AM    Specimen: Urine, Clean Catch  Result Value Ref Range Status   Specimen Description   Final    URINE, CLEAN CATCH Performed at [facility], [address] [place], [place] [address]    Special Requests   Final    NONE Performed at [facility], [address] [place],  [place] [address]    Culture >=100,000 COLONIES/mL ENTEROCOCCUS FAECALIS (A)  Final   Report Status 08/16/2022 FINAL  Final   Organism ID, Bacteria ENTEROCOCCUS FAECALIS (A)  Final      Susceptibility   Enterococcus faecalis - MIC*    AMPICILLIN <=2 SENSITIVE Sensitive     NITROFURANTOIN <=16 SENSITIVE Sensitive     VANCOMYCIN 1 SENSITIVE Sensitive     * >=100,000 COLONIES/mL ENTEROCOCCUS FAECALIS  Culture, blood (Routine X 2) w Reflex to ID Panel     Status: None   Collection Time: 08/15/22 12:22 PM   Specimen: Right Antecubital; Blood  Result Value Ref Range Status   Specimen Description RIGHT ANTECUBITAL  Final   Special Requests   Final    BOTTLES DRAWN AEROBIC AND ANAEROBIC Blood Culture adequate volume   Culture   Final    NO GROWTH 5 DAYS Performed at [facility], [address] [place], [place] [address]    Report Status 08/20/2022 FINAL  Final  Culture, blood (Routine X 2) w Reflex to ID Panel     Status: None   Collection Time: 08/15/22 12:22 PM   Specimen: BLOOD RIGHT ARM  Result Value Ref Range Status   Specimen Description BLOOD RIGHT ARM  Final   Special Requests   Final    BOTTLES DRAWN AEROBIC AND ANAEROBIC Blood Culture adequate volume   Culture   Final    NO GROWTH 5 DAYS Performed at [facility], [address] [place], [place] [address]    Report Status 08/20/2022 FINAL  Final         Radiology Studies: No results found.      Scheduled Meds:  amLODipine  5 mg Oral Daily   atorvastatin  40 mg Oral QHS   calcitRIOL  0.25 mcg Oral q AM   clopidogrel  75 mg Oral Daily   donepezil  10 mg Oral QHS   ezetimibe  10 mg Oral QPM    ferrous sulfate  325 mg Oral Q2000   heparin injection (subcutaneous)  5,000 Units Subcutaneous Q8H   ipratropium  2-4 spray Each Nare QHS   lidocaine  1 patch Transdermal Q24H   loratadine  10 mg Oral Daily   melatonin  10 mg Oral QHS   methocarbamol  500 mg Oral TID   metoprolol succinate  25 mg Oral Daily   multivitamin with minerals  1 tablet Oral QPC lunch   oxyCODONE-acetaminophen  2 tablet Oral Q6H   pantoprazole  40 mg Oral QAC breakfast   polyethylene glycol  17 g Oral BID   predniSONE  40 mg Oral Q breakfast   senna-docusate  2 tablet Oral BID   zolpidem  10 mg Oral QHS   Continuous Infusions:  sodium chloride 75 mL/hr at 08/20/22 1514   ampicillin (OMNIPEN) IV 2 g (08/21/22 0200)    Assessment & Plan:   Principal Problem:   Complication of aortic graft Active Problems:   COPD exacerbation (HCC)   AAA (abdominal aortic aneurysm) without rupture (HCC)   Thoracic aortic aneurysm without rupture ([place])   Aneurysm of right common iliac artery (HCC)   CAD S/P CABG x 3   Myocardial injury   Essential hypertension   Chronic diastolic CHF (congestive heart failure) (HCC)   Chronic kidney disease, stage IV (severe) (HCC)   HLD (hyperlipidemia)   Iron deficiency anemia   TIA (transient ischemic attack)   Mild cognitive impairment   Low back pain  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 Lucky Cowboy. Now the size has enlarged from 6.9 to 7.6 cm 10/14 status post repair on 10/9 by Dr. Lucky Cowboy           Severe back pain Lumbar OA, DDD Multiple lobar spine  hardware 10/11 ct spine with degenerative disc dz, no acute issues 10/12 declined MRI of spine to be done at [facility].  Wife stated he is too weak to be being transferred and they have opted not to be transferred anywhere for any kind of imaging 10/14 Robaxin was changed from as needed to standing dose.  Still with pain.  Will coordinate MRI with contrast of spine at [facility] once we have info about his pacemaker compatibility.       CAD S/P CABG x 3 Hx of CAD s/p of CABG and myocardial injury due to COPD exacerbation:  Myocardial injury less likely vs Troponin elevation d/t CKD4 Troponin level 31, 30.  Denies chest pain. Continue home Lipitor, Zetia hold ASA consulted Dr. Saralyn Pilar of cardiology per Dr. Dolores Hoose recommendation - optimized from cardiac standpoint, though risk is present. Troponins likely d/t CKD 10/14 TEE negative for vegetation    Low back pain This seems to be a chronic issue, has worsened recently. 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 There is osteopenia degenerative change of the lumbar spine with discogenic mild grade 1 retrolisthesis unchanged at L1-2 and L2-3, slight dextroscoliosis. pain control: As needed Percocet, Tylenol, Dilaudid --> scheduled po opiates w/ prn Dilaudid As needed Robaxin Lidoderm MRI L Spine per ID recs given (+)Bcx -patient has pacemaker, family would like to avoid transferring to different facility if possible.  10/13 Robxin change to standing dose. 10/14 hospice spoke to family they have opted to proceed with MRI   SOB w/ mucus plugging of bronchi noted on CT PE resolved on CTA (recently diagnosed, see above)  Transition heparin to DVT ppx dose now that no PE Given SOB, may consider pulmonary consult to review CT and eval if bronchoscopy warranted...Marland KitchenMarland KitchenMarland Kitchen    Chronic kidney disease, stage IV (severe) (HCC) Slightly worsening than baseline. Today 08/17/22 Cr  2.06 (vs 2.69 on admission) Monitor renal function closely by [facility] following - see note for recs re: contrast administration  Planning further contrast studies to eval PE and lumbar spine - increased fluids for now to 100 mL/h and plan to reduce this pending renal function tomorrow.    Positive blood cultures - E Facealis Bacteremia ID to follow Ampicillin Tee no vegetation  Possible COPD exacerbation (HCC) - seems less likely given CT findings from today 10/10 see above Patient is a former smoker, smoked more than 20 years.  CT chest did not show focal infiltration, does not seem to have pneumonia.  Procalcitonin elevated at 0.39.   Patient does not meet criteria for sepsis. will admit to tele bed as inpatient Bronchodilators pt was given 80 mg of Solu-Medrol in ED --> will change to oral prednisone 40 mg daily Z pak  Mucinex for cough  Incentive spirometry Urine S. Pneumococcal and legionella antigen sputum culture Nasal cannula oxygen as needed to maintain O2 saturation 93% or greater if pt desat Consider pulmonary consult     Essential hypertension IV hydralazine as needed Metoprolol hold Cozaar since pt will receive contrast  10/13 add amlodipine. Increase amlodipine to 10mg  qd   Chronic diastolic CHF (congestive heart failure) ([place]) 2D echo on 06/14/2022 showed EF of 60 to 65% with grade 1 diastolic dysfunction.   CHF seem to be compensated.    HLD (hyperlipidemia) Continue to Lipitor and zetia   Iron deficiency anemia Hemoglobin 8.6 (9.7 on 06/15/2022, and 8.6 on 06/13/2022), no active bleeding. Follow-up with CBC Continue iron supplement   History of TIA (transient ischemic attack) Continue Lipitor Hold ASA and Plavix with pending vascular surgeon's evaluation of his AAA.   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  No acute issues.   Obesity  (BMI 30-39.9) BMI= 30.68  and BW= 99.8 Diet and exercise.   Encourage to lose weight.   Anxiety start prn Xanax 0.25 mg bid     DVT prophylaxis: holding pharmacologic given bleed risk , scd Code Status:dnr Family Communication: wife Disposition Plan:  Status is: Inpatient Remains inpatient appropriate because: iv treatment,with back pain. W/u pending    LOS: 7 days   Time spent: 35 min    Nolberto Hanlon, MD [facility] Pager [phone]  If 7PM-7AM, please contact night-coverage 08/21/2022, 9:32 AM

## 2022-08-21 NOTE — Evaluation (Addendum)
Occupational Therapy Evaluation Patient Details Name: Rick Mcbride. MRN: [id number] DOB: [birth year] Today's Date: 08/21/2022   History of Present Illness Rick Mcbride. is a 81 y.o. 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     Patient was recently hospitalized from 8/2 - 8/8 due to right femoral neck fracture.  Patient had surgery and finished the rehab.  Currently he is doing PT/OT at home.  He states that in the past several days, his lower back pain has worsened, difficult walking and doing physical therapy.  The lower back pain is constant, 7 out of 10 in severity, aching, nonradiating.  No loss control of bladder or bowel movement.  Denies new injury.  Patient also reports shortness of breath and dry cough.  No chest pain, fever or chills.  Denies nausea, vomiting, diarrhea or abdominal pain. Pt still has some mild right hip pain.  No symptoms of UTI. Pt had negative PE by V/Q scan which is ordered by his PCP yesterday.   Clinical Impression   Pt and spouse agreeable to OT/PT co-evaluation to maximize safety and participation. Patient presenting with decreased independence in self care, balance, functional mobility/transfers, endurance, and cognition. At baseline, pt required assistance from wife as needed for ADLs/IADLs and used rollator or manual w/c for functional mobility. Patient currently functioning at [facility] A +2 for bed mobility, Max A for UB dressing, and Max A for LB dressing. Evaluation limited this date 2/2 pt's low back pain.  Patient will benefit from acute OT to increase overall independence in the areas of ADLs, functional mobility in order to safely discharge to next venue of care. Upon hospital discharge, recommend STR to maximize pt safety and return to PLOF.      Recommendations for follow up therapy are one component of a multi-disciplinary discharge planning process, led by the attending physician.  Recommendations may be updated based on patient status, additional functional criteria and insurance authorization.   Follow Up Recommendations  Skilled nursing-short term rehab (<3 hours/day)    Assistance Recommended at Discharge Frequent or constant Supervision/Assistance  Patient can return home with the following Two people to help with walking and/or transfers;A lot of help with bathing/dressing/bathroom;Assistance with cooking/housework;Direct supervision/assist for financial management;Assist for transportation;Help with stairs or ramp for entrance;Direct supervision/assist for medications management    Functional Status Assessment  Patient has had a recent decline in their functional status and demonstrates the ability to make significant improvements in function in a reasonable and predictable amount of time.  Equipment Recommendations  Other (comment) (defer to next venue of care)    Recommendations for Other Services       Precautions / Restrictions Precautions Precautions: Fall;Back Precaution Booklet Issued: No Restrictions Weight Bearing Restrictions: No      Mobility Bed Mobility Overal bed mobility: Needs Assistance Bed Mobility: Rolling, Supine to Sit, Sit to Supine Rolling: +2 for physical assistance, Max assist   Supine to sit: Max assist, +2 for physical assistance Sit to supine: Max assist, +2 for physical assistance   General bed mobility comments: attempted log roll technique  Transfers                   General transfer comment: deferred 2/2 low back pain       Balance Overall balance assessment: Needs assistance Sitting-balance support: Bilateral upper extremity supported, Feet supported Sitting balance-Leahy Scale: Poor Sitting balance - Comments: pt unable to tolerate static sitting position for >30 seconds despite Max A +2, unable to support self Postural control: Posterior lean                                 ADL either performed or assessed with clinical judgement   ADL Overall ADL's : Needs assistance/impaired Eating/Feeding: Set up;Bed level   Grooming: Minimal assistance;Bed level Grooming Details (indicate cue type and reason): anticipate         Upper Body Dressing : Bed level;Maximal assistance Upper Body Dressing Details (indicate cue type and reason): to don gown Lower Body Dressing: Maximal assistance;Bed level Lower Body Dressing Details (indicate cue type and reason): for socks               General ADL Comments: activities completed at bed level this date 2/2 pt unable to tolerate upright seated position     Vision Baseline Vision/History: 1 Wears glasses Patient Visual Report: No change from baseline       Perception     Praxis      Pertinent Vitals/Pain Pain Assessment Pain Assessment: Faces Faces Pain Scale: Hurts whole lot Pain Location: low back Pain Descriptors / Indicators: Grimacing, Guarding, Moaning, Sharp, Aching, Throbbing Pain Intervention(s): Limited activity within patient's tolerance, Monitored during session, Premedicated before session, Repositioned     Hand Dominance Right   Extremity/Trunk Assessment Upper Extremity Assessment Upper Extremity Assessment: Generalized weakness   Lower Extremity Assessment Lower Extremity Assessment: Generalized weakness       Communication Communication Communication: No difficulties   Cognition Arousal/Alertness: Lethargic, Suspect due to medications Behavior During Therapy: Flat affect Overall Cognitive Status:  History of cognitive impairments - at baseline                                 General Comments: pt with "mild cognitive impairment" at baseline, pt A&Ox3 (able to state name/DOB, location, grossly oriented to situation). Pt very lethargic during eval, required VC for alertness throughout      General Comments       Exercises Other Exercises Other Exercises: OT provided education re: role of OT, OT POC, post acute recs, sitting up for all meals, EOB/OOB mobility with assistance, home/fall safety.     Shoulder Instructions      Home Living Family/patient expects to be discharged to:: Private residence Living Arrangements: Spouse/significant other Available Help at Discharge: Family;Available 24 hours/day Type of Home: House (townhouse) Home Access: Ramped entrance;Stairs to enter [employer] of Steps: 1 small step from garage port Entrance Stairs-Rails: None Home Layout: One level     Bathroom Shower/Tub: Occupational psychologist: Handicapped height Bathroom Accessibility: Yes   Home Equipment: Conservation officer, nature (2 wheels);Rollator (4 wheels);Cane - single point;Shower seat - built in;Grab bars - tub/shower;Wheelchair - manual   Additional Comments: Was receiving HH OT/PT prior to admission      Prior Functioning/Environment Prior Level of Function : Needs assist             Mobility Comments:  Rollator for household distances, w/c for community distances & going to MD appointments ADLs Comments: Assist from wife for ADLs/IADLs, pt no longer drives. Wife reported assisting pt with sponge bathing PTA, next step with HHOT was getting into shower for bathing        OT Problem List: Decreased strength;Decreased knowledge of use of DME or AE;Decreased activity tolerance;Impaired balance (sitting and/or standing);Pain;Decreased cognition      OT Treatment/Interventions: Self-care/ADL training;Patient/family education;Therapeutic  exercise;Balance training;Energy conservation;Therapeutic activities;DME and/or AE instruction;Cognitive remediation/compensation    OT Goals(Current goals can be found in the care plan section) Acute Rehab OT Goals Patient Stated Goal: reduce pain OT Goal Formulation: With patient/family Time For Goal Achievement: 09/04/22 Potential to Achieve Goals: Fair ADL Goals Pt Will Perform Grooming: with supervision;sitting Pt Will Perform Upper Body Dressing: with min assist;sitting Pt Will Perform Lower Body Dressing: with min assist;sit to/from stand;with adaptive equipment;with caregiver independent in assisting Pt Will Transfer to Toilet: with min assist;stand pivot transfer;bedside commode Pt Will Perform Toileting - Clothing Manipulation and hygiene: with min assist;with caregiver independent in assisting;with adaptive equipment;sit to/from stand  OT Frequency: Min 2X/week    Co-evaluation PT/OT/SLP Co-Evaluation/Treatment: Yes Reason for Co-Treatment: For patient/therapist safety;Complexity of the patient's impairments (multi-system involvement);To address functional/ADL transfers PT goals addressed during session: Mobility/safety with mobility;Strengthening/ROM OT goals addressed during session: ADL's and self-care      AM-PAC OT "6 Clicks" Daily Activity     Outcome Measure Help from another person eating meals?: A Little Help from another person taking care of personal grooming?: A Little Help from another person toileting, which includes using toliet, bedpan, or urinal?: A Lot Help from another person bathing (including washing, rinsing, drying)?: A Lot Help from another person to put on and taking off regular upper body clothing?: A Lot Help from another person to put on and taking off regular lower body clothing?: A Lot 6 Click Score: 14   End of Session Nurse Communication: Mobility status  Activity Tolerance: Patient limited by pain Patient left: in bed;with call bell/phone  within reach;with bed alarm set  OT Visit Diagnosis: Other abnormalities of gait and mobility (R26.89);Muscle weakness (generalized) (M62.81);Pain;Other symptoms and signs involving cognitive function Pain - part of body:  (low back)                Time: 5670-1410 OT Time Calculation (min): 32 min Charges:  OT General Charges $OT Visit: 1 Visit OT Evaluation $OT Eval Moderate Complexity: 1 Mod  [facility] MS, OTR/L ascom [phone]  08/21/22, 2:18 PM

## 2022-08-21 NOTE — Evaluation (Signed)
Physical Therapy Evaluation Patient Details Name: Rick Mcbride. MRN: [id number] DOB: [birth year] Today's Date: 08/21/2022  History of Present Illness  Rick T Winter Trefz. is a 81 y.o. 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     Patient was recently hospitalized from 8/2 - 8/8 due to right femoral neck fracture.  Patient had surgery and finished the rehab.  Currently he is doing PT/OT at home.  He states that in the past several days, his lower back pain has worsened, difficult walking and doing physical therapy.  The lower back pain is constant, 7 out of 10 in severity, aching, nonradiating.  No loss control of bladder or bowel movement.  Denies new injury.  Patient also reports shortness of breath and dry cough.  No chest pain, fever or chills.  Denies nausea, vomiting, diarrhea or abdominal pain. Pt still has some mild right hip pain.  No symptoms of UTI. Pt had negative PE by V/Q scan which is ordered by his PCP yesterday.  Clinical Impression  Patient presents with very limited evaluation performed as co-treat with OT secondary to high pain levels. He presents today with pain limited mobility- difficulty performing assessment and deferred standing or walking as patient was only able to sit up for approx 30 sec and requested to sit back down. Unable to fully assess LE's and patient required +2 assist with supine <->sit. Patient will benefit from skilled PT services/interventions to improve his overall pian and mobility to progress toward more  functional mobility. Recommending SNF at this time based on his poor mobility status.      Recommendations for follow up therapy are one component of a multi-disciplinary discharge planning process, led by the attending physician.  Recommendations may be updated based on patient status, additional functional criteria and insurance authorization.  Follow Up Recommendations Skilled nursing-short term rehab (<3 hours/day) Can patient physically be transported by private vehicle: No    Assistance Recommended at Discharge Frequent or constant Supervision/Assistance  Patient can return home with the following  Two people to help with walking and/or transfers;Two people to help with bathing/dressing/bathroom;Direct supervision/assist for medications management;Assistance with feeding;Assistance with cooking/housework;A lot of help with walking and/or transfers;A lot of help with bathing/dressing/bathroom;Direct supervision/assist for financial management;Assist for transportation;Help with stairs or ramp for entrance    Equipment Recommendations None recommended by PT  Recommendations for Other Services       Functional Status Assessment Patient has had a recent decline in their functional status and demonstrates the ability to make significant improvements in function in a reasonable and predictable amount of time.     Precautions / Restrictions Precautions Precautions: Fall;Back Precaution Booklet Issued: No Restrictions Weight Bearing Restrictions: No      Mobility  Bed Mobility Overal bed mobility: Needs Assistance (+2 max assist  (supine to sit) attempting log roll.) Bed Mobility: Rolling, Supine to Sit, Sit to Supine Rolling: +2 for physical assistance, Max assist   Supine to sit: Max assist, +2 for physical assistance Sit to supine: Max assist, +2 for physical assistance   General bed mobility comments: Increased  report of Low back pain overall during visit- difficulty with all  bed mobility. Patient Response: Cooperative Marine scientist)  Transfers                   General transfer comment: Patient declined to attempt to stand due to acute Low back pain.    Ambulation/Gait                  Stairs            Wheelchair Mobility    Modified Rankin (Stroke Patients Only)       Balance Overall balance assessment: Needs assistance (physical assist to maintain upright sitting posture) Sitting-balance support: Bilateral upper extremity supported, Feet supported Sitting balance-Leahy Scale: Fair                                       Pertinent Vitals/Pain Pain Assessment Pain Assessment: 0-10 (Patient did not rate when asked only stated doing better than last night. Yet upon positioning from supine to sit - yelled in pain and only able to sit up approx 30 sec before asking to lay back down.) Pain Descriptors / Indicators: Grimacing, Guarding, Moaning, Sharp, Aching, Throbbing Pain Intervention(s): Limited activity within patient's tolerance, Monitored during session, Premedicated before session, Repositioned, Other (comment) (repositioned for comfort)    Home Living                          Prior Function                       Hand Dominance        Extremity/Trunk Assessment   Upper Extremity Assessment Upper Extremity Assessment: Defer to OT evaluation    Lower Extremity Assessment Lower Extremity Assessment:  (Difficult to assess secondary to increased pain with all mobility- unable to sit up more than 30 sec and pain with supine LE mobility.)       Communication      Cognition Arousal/Alertness: Lethargic, Suspect due to medications Behavior During Therapy:  (lethargic- kept eyes closed yet easily arousable) Overall Cognitive Status: Within Functional Limits for tasks assessed                                          General Comments      Exercises      Assessment/Plan    PT Assessment Patient needs continued PT services  PT Problem List Decreased strength;Decreased range of motion;Decreased activity tolerance;Decreased balance;Decreased mobility;Decreased coordination;Decreased knowledge of precautions;Pain       PT Treatment Interventions Gait training;DME instruction;Functional mobility training;Therapeutic activities;Therapeutic exercise;Balance training;Neuromuscular re-education;Patient/family education;Manual techniques;Modalities    PT Goals (Current goals can be found in the Care Plan section)  Acute Rehab PT Goals Patient Stated Goal: Feel better PT Goal Formulation: With patient/family Time For Goal Achievement: 09/04/22 Potential to Achieve Goals: Fair    Frequency Min 2X/week     Co-evaluation PT/OT/SLP Co-Evaluation/Treatment: Yes Reason for Co-Treatment: To address functional/ADL transfers;For patient/therapist safety;Complexity of the patient's impairments (multi-system involvement) PT goals addressed during session: Mobility/safety with mobility;Strengthening/ROM         AM-PAC PT "6 Clicks" Mobility  Outcome Measure Help needed turning from your back to your side while in a flat bed without  using bedrails?: A Lot Help needed moving from lying on your back to sitting on the side of a flat bed without using bedrails?: A Lot Help needed moving to and from a bed to a chair (including a wheelchair)?: A Lot Help needed standing up from a chair using your arms (e.g., wheelchair or bedside chair)?: A Lot Help needed to walk in hospital room?: A Lot Help needed climbing 3-5 steps with a railing? : A Lot 6 Click Score: 12    End of Session Equipment Utilized During Treatment: Gait belt Activity Tolerance: Patient limited by fatigue;Patient limited by lethargy;Patient limited by pain Patient left: in bed;with call bell/phone within reach;with bed alarm set;with SCD's reapplied Nurse Communication: Mobility  status PT Visit Diagnosis: Unsteadiness on feet (R26.81);Other abnormalities of gait and mobility (R26.89);Muscle weakness (generalized) (M62.81);Difficulty in walking, not elsewhere classified (R26.2);Pain Pain - part of body:  (low back)    Time: 1000-1026 PT Time Calculation (min) (ACUTE ONLY): 26 min   Charges:   PT Evaluation $PT Eval Moderate Complexity: 1 Mod PT Treatments $Therapeutic Activity: 8-22 mins        Ollen Bowl, PT 08/21/2022, 1:32 PM

## 2022-08-21 NOTE — Progress Notes (Signed)
[place], [place] 08/21/22  Subjective:   Hospital day # 7  Patient known to our practice from outpatient follow-up of CKD, with Dr Candiss Norse.  Patient was seen today on second floor Patient was resting comfortably in the bed Patient wife was present in the room Patient spouse concern continues to be about his back pain.  She did inform me that the back pain is better than before    10/13 0701 - 10/14 0700 In: 3653.4 [P.O.:260; I.V.:2893.4; IV Piggyback:500] Out: 2400 [Urine:2400] Lab Results  Component Value Date   CREATININE 2.05 (H) 08/20/2022   CREATININE 2.05 (H) 08/18/2022   CREATININE 2.06 (H) 08/17/2022     Objective:  Vital signs in last 24 hours:  Temp:  [98 F (36.7 C)-98.8 F (37.1 C)] 98.3 F (36.8 C) (10/14 1214) Pulse Rate:  [74-107] 92 (10/14 1214) Resp:  [16-25] 18 (10/14 1214) BP: (126-176)/(72-88) 126/72 (10/14 1214) SpO2:  [91 %-100 %] 96 % (10/14 1214)  Weight change:  Filed Weights   08/16/22 0226 08/19/22 0512 08/19/22 0748  Weight: 86.1 kg 93.4 kg 93.4 kg    Intake/Output:    Intake/Output Summary (Last 24 hours) at 08/21/2022 1559 Last data filed at 08/21/2022 1300 Gross per 24 hour  Intake 2497.5 ml  Output 2650 ml  Net -152.5 ml     Physical Exam: General: elderly gentleman, laying in the bed  HEENT Anicteric, moist oral mucous membranes  Pulm/lungs Normal breathing effort, clear to auscultation  CVS/Heart No rub or gallop  Abdomen:  Soft, nontender  Extremities: No peripheral edema  Neurologic: Alert, oriented, pain with movement of right leg  Skin: No acute rashes          Basic Metabolic Panel:  Recent Labs  Lab 08/15/22 0440 08/16/22 0403 08/17/22 0530 08/18/22 0406 08/20/22 0606  NA 137 138 138 141  --   K 4.3 4.5 3.9 3.9  --   CL 108 109 109 109  --   CO2 24 22 22  20*  --   GLUCOSE 104* 106* 88 115*  --   BUN 32* 30* 28* 30*  --   CREATININE 2.33* 2.10* 2.06* 2.05* 2.05*   CALCIUM 8.7* 8.6* 8.4* 8.8*  --      CBC: Recent Labs  Lab 08/15/22 0440 08/16/22 0403 08/17/22 0530 08/18/22 0406  WBC 4.4 5.4 5.5 7.2  HGB 7.8* 7.9* 8.0* 9.4*  HCT 25.5* 25.4* 26.1* 30.0*  MCV 90.7 91.0 91.3 88.8  PLT 149* 155 136* 193     No results found for: "HEPBSAG", "HEPBSAB", "HEPBIGM"    Microbiology:  Recent Results (from the past 240 hour(s))  Culture, blood (routine x 2)     Status: Abnormal   Collection Time: 08/14/22  3:57 AM   Specimen: BLOOD  Result Value Ref Range Status   Specimen Description   Final    BLOOD BLOOD RIGHT ARM Performed at [facility], [address], [place], [place] [address]    Special Requests   Final    BOTTLES DRAWN AEROBIC AND ANAEROBIC Blood Culture adequate volume Performed at [facility], [address] [place], [place] [address]    Culture  Setup Time   Final    Organism ID to follow [place] TO, READ BACK BY AND VERIFIED WITH: PHARMD CHILDS AT [place] 08/14/2022 GAA Performed at [facility], [address] [place], [place] [address]  Culture ENTEROCOCCUS FAECALIS (A)  Final   Report Status 08/17/2022 FINAL  Final   Organism ID, Bacteria ENTEROCOCCUS FAECALIS  Final      Susceptibility   Enterococcus faecalis - MIC*    AMPICILLIN <=2 SENSITIVE Sensitive     VANCOMYCIN 1 SENSITIVE Sensitive     GENTAMICIN SYNERGY SENSITIVE Sensitive     * ENTEROCOCCUS FAECALIS  Culture, blood (routine x 2)     Status: Abnormal   Collection Time: 08/14/22  3:57 AM   Specimen: BLOOD  Result Value Ref Range Status   Specimen Description   Final    BLOOD BLOOD LEFT ARM Performed at [facility], [address] [place], [place] [address]    Special Requests   Final    BOTTLES DRAWN AEROBIC AND ANAEROBIC Blood Culture adequate volume Performed at [facility], [address] [place], [place] [address]     Culture  Setup Time   Final    GRAM POSITIVE COCCI AEROBIC BOTTLE ONLY CRITICAL RESULT CALLED TO, READ BACK BY AND VERIFIED WITH: PHARMD CHILDS AT [place] 08/14/2022 GAA GRAM STAIN REVIEWED-AGREE WITH RESULT Performed at [facility], [address] [place], [place] [address]    Culture (A)  Final    ENTEROCOCCUS FAECALIS SUSCEPTIBILITIES PERFORMED ON PREVIOUS CULTURE WITHIN THE LAST 5 DAYS. Performed at [facility], [place] [address] [place], [place] [address]    Report Status 08/17/2022 FINAL  Final  Resp Panel by RT-PCR (Flu A&B, Covid) Anterior Nasal Swab     Status: None   Collection Time: 08/14/22  3:57 AM   Specimen: Anterior Nasal Swab  Result Value Ref Range Status   SARS Coronavirus 2 by RT PCR NEGATIVE NEGATIVE Final    Comment: (NOTE) SARS-CoV-2 target nucleic acids are NOT DETECTED.  The SARS-CoV-2 RNA is generally detectable in upper respiratory specimens during the acute phase of infection. The lowest concentration of SARS-CoV-2 viral copies this assay can detect is 138 copies/mL. A negative result does not preclude SARS-Cov-2 infection and should not be used as the sole basis for treatment or other patient management decisions. A negative result may occur with  improper specimen collection/handling, submission of specimen other than nasopharyngeal swab, presence of viral mutation(s) within the areas targeted by this assay, and inadequate number of viral copies(<138 copies/mL). A negative result must be combined with clinical observations, patient history, and epidemiological information. The expected result is Negative.  Fact Sheet for Patients:  EntrepreneurPulse.com.au  Fact Sheet for Healthcare Providers:  IncredibleEmployment.be  This test is no t yet approved or cleared by the [place] FDA and  has been authorized for detection and/or diagnosis of SARS-CoV-2 by FDA under an Emergency Use Authorization  (EUA). This EUA will remain  in effect (meaning this test can be used) for the duration of the COVID-19 declaration under Section 564(b)(1) of the Act, 21 U.S.C.section 360bbb-3(b)(1), unless the authorization is terminated  or revoked sooner.       Influenza A by PCR NEGATIVE NEGATIVE Final   Influenza B by PCR NEGATIVE NEGATIVE Final    Comment: (NOTE) The Xpert Xpress SARS-CoV-2/FLU/RSV plus assay is intended as an aid in the diagnosis of influenza from Nasopharyngeal swab specimens and should not be used as a sole basis for treatment. Nasal washings and aspirates are unacceptable for Xpert Xpress SARS-CoV-2/FLU/RSV testing.  Fact Sheet for Patients: EntrepreneurPulse.com.au  Fact Sheet for Healthcare Providers: IncredibleEmployment.be  This test is not yet approved or cleared by the  [place] Architectural technologist and has been authorized for detection and/or diagnosis of SARS-CoV-2 by FDA under an Print production planner (EUA). This EUA will remain in effect (meaning this test can be used) for the duration of the COVID-19 declaration under Section 564(b)(1) of the Act, 21 U.S.C. section 360bbb-3(b)(1), unless the authorization is terminated or revoked.  Performed at [facility], [address] [place], [place] [address]   Blood Culture ID Panel (Reflexed)     Status: Abnormal   Collection Time: 08/14/22  3:57 AM  Result Value Ref Range Status   Enterococcus faecalis DETECTED (A) NOT DETECTED Final    Comment: CRITICAL RESULT CALLED TO, READ BACK BY AND VERIFIED WITH: PHARMD CHILDS AT 1957 08/14/2022 GAA    Enterococcus Faecium NOT DETECTED NOT DETECTED Final   Listeria monocytogenes NOT DETECTED NOT DETECTED Final   Staphylococcus species NOT DETECTED NOT DETECTED Final   Staphylococcus aureus (BCID) NOT DETECTED NOT DETECTED Final   Staphylococcus epidermidis NOT DETECTED NOT DETECTED Final   Staphylococcus lugdunensis NOT  DETECTED NOT DETECTED Final   Streptococcus species NOT DETECTED NOT DETECTED Final   Streptococcus agalactiae NOT DETECTED NOT DETECTED Final   Streptococcus pneumoniae NOT DETECTED NOT DETECTED Final   Streptococcus pyogenes NOT DETECTED NOT DETECTED Final   A.calcoaceticus-baumannii NOT DETECTED NOT DETECTED Final   Bacteroides fragilis NOT DETECTED NOT DETECTED Final   Enterobacterales NOT DETECTED NOT DETECTED Final   Enterobacter cloacae complex NOT DETECTED NOT DETECTED Final   Escherichia coli NOT DETECTED NOT DETECTED Final   Klebsiella aerogenes NOT DETECTED NOT DETECTED Final   Klebsiella oxytoca NOT DETECTED NOT DETECTED Final   Klebsiella pneumoniae NOT DETECTED NOT DETECTED Final   Proteus species NOT DETECTED NOT DETECTED Final   Salmonella species NOT DETECTED NOT DETECTED Final   Serratia marcescens NOT DETECTED NOT DETECTED Final   Haemophilus influenzae NOT DETECTED NOT DETECTED Final   Neisseria meningitidis NOT DETECTED NOT DETECTED Final   Pseudomonas aeruginosa NOT DETECTED NOT DETECTED Final   Stenotrophomonas maltophilia NOT DETECTED NOT DETECTED Final   Candida albicans NOT DETECTED NOT DETECTED Final   Candida auris NOT DETECTED NOT DETECTED Final   Candida glabrata NOT DETECTED NOT DETECTED Final   Candida krusei NOT DETECTED NOT DETECTED Final   Candida parapsilosis NOT DETECTED NOT DETECTED Final   Candida tropicalis NOT DETECTED NOT DETECTED Final   Cryptococcus neoformans/gattii NOT DETECTED NOT DETECTED Final   Vancomycin resistance NOT DETECTED NOT DETECTED Final    Comment: Performed at [facility], [address], [place], [place] [address]  Urine Culture     Status: Abnormal   Collection Time: 08/14/22  6:54 AM   Specimen: Urine, Clean Catch  Result Value Ref Range Status   Specimen Description   Final    URINE, CLEAN CATCH Performed at [facility], [address], [place], [place] [address]    Special Requests   Final     NONE Performed at [facility], [address] [place], [place] [address]    Culture >=100,000 COLONIES/mL ENTEROCOCCUS FAECALIS (A)  Final   Report Status 08/16/2022 FINAL  Final   Organism ID, Bacteria ENTEROCOCCUS FAECALIS (A)  Final      Susceptibility   Enterococcus faecalis - MIC*    AMPICILLIN <=2 SENSITIVE Sensitive     NITROFURANTOIN <=16 SENSITIVE Sensitive     VANCOMYCIN 1 SENSITIVE Sensitive     * >=100,000 COLONIES/mL ENTEROCOCCUS FAECALIS  Culture, blood (Routine X 2) w  Reflex to ID Panel     Status: None   Collection Time: 08/15/22 12:22 PM   Specimen: Right Antecubital; Blood  Result Value Ref Range Status   Specimen Description RIGHT ANTECUBITAL  Final   Special Requests   Final    BOTTLES DRAWN AEROBIC AND ANAEROBIC Blood Culture adequate volume   Culture   Final    NO GROWTH 5 DAYS Performed at [facility], [address] [place], [place] [address]    Report Status 08/20/2022 FINAL  Final  Culture, blood (Routine X 2) w Reflex to ID Panel     Status: None   Collection Time: 08/15/22 12:22 PM   Specimen: BLOOD RIGHT ARM  Result Value Ref Range Status   Specimen Description BLOOD RIGHT ARM  Final   Special Requests   Final    BOTTLES DRAWN AEROBIC AND ANAEROBIC Blood Culture adequate volume   Culture   Final    NO GROWTH 5 DAYS Performed at [facility], [address] [place], [place] [address]    Report Status 08/20/2022 FINAL  Final    Coagulation Studies: No results for input(s): "LABPROT", "INR" in the last 72 hours.  Urinalysis: No results for input(s): "COLORURINE", "LABSPEC", "PHURINE", "GLUCOSEU", "HGBUR", "BILIRUBINUR", "KETONESUR", "PROTEINUR", "UROBILINOGEN", "NITRITE", "LEUKOCYTESUR" in the last 72 hours.  Invalid input(s): "APPERANCEUR"     Imaging: No results found.   Medications:    sodium chloride 75 mL/hr at 08/20/22 1514   ampicillin (OMNIPEN) IV 2 g (08/21/22 1424)    [START ON 08/22/2022]  amLODipine  10 mg Oral Daily   atorvastatin  40 mg Oral QHS   calcitRIOL  0.25 mcg Oral q AM   clopidogrel  75 mg Oral Daily   donepezil  10 mg Oral QHS   ezetimibe  10 mg Oral QPM   ferrous sulfate  325 mg Oral Q2000   heparin injection (subcutaneous)  5,000 Units Subcutaneous Q8H   ipratropium  2-4 spray Each Nare QHS   lidocaine  1 patch Transdermal Q24H   loratadine  10 mg Oral Daily   melatonin  10 mg Oral QHS   methocarbamol  500 mg Oral TID   metoprolol succinate  25 mg Oral Daily   multivitamin with minerals  1 tablet Oral QPC lunch   oxyCODONE-acetaminophen  2 tablet Oral Q6H   pantoprazole  40 mg Oral QAC breakfast   polyethylene glycol  17 g Oral BID   predniSONE  40 mg Oral Q breakfast   senna-docusate  2 tablet Oral BID   zolpidem  10 mg Oral QHS   acetaminophen, albuterol, chlorproMAZINE, dextromethorphan-guaiFENesin, hydrALAZINE, HYDROmorphone (DILAUDID) injection, ondansetron (ZOFRAN) IV  Assessment/ Plan:  81 y.o. 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, history of stroke   admitted on 08/14/2022 for SOB (shortness of breath) [R06.02] Bronchitis [J40] Elevated troponin [R79.89] COPD exacerbation (HCC) [J44.1] CAP (community acquired pneumonia) [J18.9] Generalized weakness [R53.1] AKI (acute kidney injury) ([place]) [N17.9] Acute midline low back pain without sciatica [M54.50]   Impression 1-Acute kidney injury Patient had AKI most likely secondary to contrast-induced nephropathy Patient peak creatinine was at 2.7 on October 7 now it has come down to his baseline of around 2.0  2-CKD stage IV Patient has CKD most likely secondary to hypertension With some contribution from age associated  decline as patient is 81 years old Kidney  function is at baseline.  Urinalysis shows  small proteinuria.     3-Anemia of chronic disease Patient hemoglobin is stable at 9.4  4-E faecalis bacteremia Patient is being followed by ID  5-Diastolic CHF   Patient is well compensated   2D echo from 06/14/2022 shows LVEF 60 to 03%, grade 1 diastolic dysfunction   Plan: Creatinine stable.    Primary team to continue IV antibiotics.       LOS: [place] 10/14/20233:59 PM  B and E, Garner  Note: This note was prepared with Dragon dictation. Any transcription errors are unintentional

## 2022-08-21 NOTE — Progress Notes (Signed)
[facility]) Hospice hospital liaison note  Asked by PMT to discuss hospice services with family. Talked with both patient's wife as well as son and daughter in law. Son and daughter in law report that at this time they would like to pursue transfer and MRI, they do not believe he is appropriate for services at this time. This was relayed to attending physician.   [facility] will continue to follow for any discharge planning needs.   Thank you,  Jhonnie Garner, BSN, RN [phone]

## 2022-08-21 NOTE — Progress Notes (Signed)
Patient restless, having hiccups, and back spasms throughout the night.  PRN meds given per order.  Delirium score 3.  Patient not sleeping despite interventions.

## 2022-08-22 DIAGNOSIS — I714 Abdominal aortic aneurysm, without rupture, unspecified: Secondary | ICD-10-CM | POA: Diagnosis not present

## 2022-08-22 DIAGNOSIS — R7881 Bacteremia: Secondary | ICD-10-CM | POA: Diagnosis not present

## 2022-08-22 DIAGNOSIS — M545 Low back pain, unspecified: Secondary | ICD-10-CM | POA: Diagnosis not present

## 2022-08-22 DIAGNOSIS — B952 Enterococcus as the cause of diseases classified elsewhere: Secondary | ICD-10-CM | POA: Diagnosis not present

## 2022-08-22 LAB — RENAL FUNCTION PANEL
Albumin: 2.3 g/dL — ABNORMAL LOW (ref 3.5–5.0)
Anion gap: 8 (ref 5–15)
BUN: 32 mg/dL — ABNORMAL HIGH (ref 8–23)
CO2: 21 mmol/L — ABNORMAL LOW (ref 22–32)
Calcium: 8.4 mg/dL — ABNORMAL LOW (ref 8.9–10.3)
Chloride: 109 mmol/L (ref 98–111)
Creatinine, Ser: 1.99 mg/dL — ABNORMAL HIGH (ref 0.61–1.24)
GFR, Estimated: 33 mL/min — ABNORMAL LOW (ref >60)
Glucose, Bld: 117 mg/dL — ABNORMAL HIGH (ref 70–99)
Phosphorus: 3.6 mg/dL (ref 2.5–4.6)
Potassium: 3.5 mmol/L (ref 3.5–5.1)
Sodium: 138 mmol/L (ref 135–145)

## 2022-08-22 LAB — CBC
HCT: 25 % — ABNORMAL LOW (ref 39.0–52.0)
Hemoglobin: 7.9 g/dL — ABNORMAL LOW (ref 13.0–17.0)
MCH: 28 pg (ref 26.0–34.0)
MCHC: 31.6 g/dL (ref 30.0–36.0)
MCV: 88.7 fL (ref 80.0–100.0)
Platelets: 221 10*3/uL (ref 150–400)
RBC: 2.82 MIL/uL — ABNORMAL LOW (ref 4.22–5.81)
RDW: 16.2 % — ABNORMAL HIGH (ref 11.5–15.5)
WBC: 10.6 10*3/uL — ABNORMAL HIGH (ref 4.0–10.5)
nRBC: 0.2 % (ref 0.0–0.2)

## 2022-08-22 NOTE — Progress Notes (Signed)
[place], [place] 08/22/22  Subjective:   Hospital day # 8  Patient known to our practice from outpatient follow-up of CKD, with Dr Candiss Norse.  Patient was seen today on second floor.Patient wife was present in the room. Patient was resting comfortably in the bed      10/14 0701 - 10/15 0700 In: 340 [P.O.:240; IV Piggyback:100] Out: 950 [Urine:950] Lab Results  Component Value Date   CREATININE 1.99 (H) 08/22/2022   CREATININE 2.05 (H) 08/20/2022   CREATININE 2.05 (H) 08/18/2022     Objective:  Vital signs in last 24 hours:  Temp:  [97.4 F (36.3 C)-99 F (37.2 C)] 97.4 F (36.3 C) (10/15 1158) Pulse Rate:  [80-97] 97 (10/15 1158) Resp:  [18-20] 18 (10/15 1158) BP: (117-156)/(58-83) 117/66 (10/15 1158) SpO2:  [95 %-97 %] 96 % (10/15 1158)  Weight change:  Filed Weights   08/16/22 0226 08/19/22 0512 08/19/22 0748  Weight: 86.1 kg 93.4 kg 93.4 kg    Intake/Output:    Intake/Output Summary (Last 24 hours) at 08/22/2022 1306 Last data filed at 08/22/2022 1000 Gross per 24 hour  Intake 540 ml  Output 300 ml  Net 240 ml     Physical Exam: General: elderly gentleman, laying in the bed  HEENT Anicteric, moist oral mucous membranes  Pulm/lungs Normal breathing effort, clear to auscultation  CVS/Heart No rub or gallop  Abdomen:  Soft, nontender  Extremities: No peripheral edema  Neurologic: Alert, oriented, pain with movement of right leg  Skin: No acute rashes          Basic Metabolic Panel:  Recent Labs  Lab 08/16/22 0403 08/17/22 0530 08/18/22 0406 08/20/22 0606 08/22/22 0519  NA 138 138 141  --  138  K 4.5 3.9 3.9  --  3.5  CL 109 109 109  --  109  CO2 22 22 20*  --  21*  GLUCOSE 106* 88 115*  --  117*  BUN 30* 28* 30*  --  32*  CREATININE 2.10* 2.06* 2.05* 2.05* 1.99*  CALCIUM 8.6* 8.4* 8.8*  --  8.4*  PHOS  --   --   --   --  3.6     CBC: Recent Labs  Lab 08/16/22 0403 08/17/22 0530 08/18/22 0406  08/22/22 0519  WBC 5.4 5.5 7.2 10.6*  HGB 7.9* 8.0* 9.4* 7.9*  HCT 25.4* 26.1* 30.0* 25.0*  MCV 91.0 91.3 88.8 88.7  PLT 155 136* 193 221     No results found for: "HEPBSAG", "HEPBSAB", "HEPBIGM"    Microbiology:  Recent Results (from the past 240 hour(s))  Culture, blood (routine x 2)     Status: Abnormal   Collection Time: 08/14/22  3:57 AM   Specimen: BLOOD  Result Value Ref Range Status   Specimen Description   Final    BLOOD BLOOD RIGHT ARM Performed at [facility], [address] [place], [place] [address]    Special Requests   Final    BOTTLES DRAWN AEROBIC AND ANAEROBIC Blood Culture adequate volume Performed at [facility], [address] [place], [place] [address]    Culture  Setup Time   Final    Organism ID to follow [place] TO, READ BACK BY AND VERIFIED WITH: PHARMD CHILDS AT [place] 08/14/2022 GAA Performed at [facility], [address] [place], [place] [address]    Culture ENTEROCOCCUS FAECALIS (A)  Final  Report Status 08/17/2022 FINAL  Final   Organism ID, Bacteria ENTEROCOCCUS FAECALIS  Final      Susceptibility   Enterococcus faecalis - MIC*    AMPICILLIN <=2 SENSITIVE Sensitive     VANCOMYCIN 1 SENSITIVE Sensitive     GENTAMICIN SYNERGY SENSITIVE Sensitive     * ENTEROCOCCUS FAECALIS  Culture, blood (routine x 2)     Status: Abnormal   Collection Time: 08/14/22  3:57 AM   Specimen: BLOOD  Result Value Ref Range Status   Specimen Description   Final    BLOOD BLOOD LEFT ARM Performed at [facility], [address] [place], [place] [address]    Special Requests   Final    BOTTLES DRAWN AEROBIC AND ANAEROBIC Blood Culture adequate volume Performed at [facility], [address] [place], [place] [address]    Culture  Setup Time   Final    GRAM POSITIVE COCCI AEROBIC BOTTLE ONLY CRITICAL RESULT CALLED TO, READ BACK  BY AND VERIFIED WITH: PHARMD CHILDS AT [place] 08/14/2022 GAA GRAM STAIN REVIEWED-AGREE WITH RESULT Performed at [facility], [address] [place], [place] [address]    Culture (A)  Final    ENTEROCOCCUS FAECALIS SUSCEPTIBILITIES PERFORMED ON PREVIOUS CULTURE WITHIN THE LAST 5 DAYS. Performed at [facility], [place] [address], [place], [place] [address]    Report Status 08/17/2022 FINAL  Final  Resp Panel by RT-PCR (Flu A&B, Covid) Anterior Nasal Swab     Status: None   Collection Time: 08/14/22  3:57 AM   Specimen: Anterior Nasal Swab  Result Value Ref Range Status   SARS Coronavirus 2 by RT PCR NEGATIVE NEGATIVE Final    Comment: (NOTE) SARS-CoV-2 target nucleic acids are NOT DETECTED.  The SARS-CoV-2 RNA is generally detectable in upper respiratory specimens during the acute phase of infection. The lowest concentration of SARS-CoV-2 viral copies this assay can detect is 138 copies/mL. A negative result does not preclude SARS-Cov-2 infection and should not be used as the sole basis for treatment or other patient management decisions. A negative result may occur with  improper specimen collection/handling, submission of specimen other than nasopharyngeal swab, presence of viral mutation(s) within the areas targeted by this assay, and inadequate number of viral copies(<138 copies/mL). A negative result must be combined with clinical observations, patient history, and epidemiological information. The expected result is Negative.  Fact Sheet for Patients:  EntrepreneurPulse.com.au  Fact Sheet for Healthcare Providers:  IncredibleEmployment.be  This test is no t yet approved or cleared by the [place] FDA and  has been authorized for detection and/or diagnosis of SARS-CoV-2 by FDA under an Emergency Use Authorization (EUA). This EUA will remain  in effect (meaning this test can be used) for the duration of the COVID-19  declaration under Section 564(b)(1) of the Act, 21 U.S.C.section 360bbb-3(b)(1), unless the authorization is terminated  or revoked sooner.       Influenza A by PCR NEGATIVE NEGATIVE Final   Influenza B by PCR NEGATIVE NEGATIVE Final    Comment: (NOTE) The Xpert Xpress SARS-CoV-2/FLU/RSV plus assay is intended as an aid in the diagnosis of influenza from Nasopharyngeal swab specimens and should not be used as a sole basis for treatment. Nasal washings and aspirates are unacceptable for Xpert Xpress SARS-CoV-2/FLU/RSV testing.  Fact Sheet for Patients: EntrepreneurPulse.com.au  Fact Sheet for Healthcare Providers: IncredibleEmployment.be  This test is not yet approved or cleared by the [place] and has been authorized for  detection and/or diagnosis of SARS-CoV-2 by FDA under an Emergency Use Authorization (EUA). This EUA will remain in effect (meaning this test can be used) for the duration of the COVID-19 declaration under Section 564(b)(1) of the Act, 21 U.S.C. section 360bbb-3(b)(1), unless the authorization is terminated or revoked.  Performed at [facility], [address] [place], [place] [address]   Blood Culture ID Panel (Reflexed)     Status: Abnormal   Collection Time: 08/14/22  3:57 AM  Result Value Ref Range Status   Enterococcus faecalis DETECTED (A) NOT DETECTED Final    Comment: CRITICAL RESULT CALLED TO, READ BACK BY AND VERIFIED WITH: PHARMD CHILDS AT 1957 08/14/2022 GAA    Enterococcus Faecium NOT DETECTED NOT DETECTED Final   Listeria monocytogenes NOT DETECTED NOT DETECTED Final   Staphylococcus species NOT DETECTED NOT DETECTED Final   Staphylococcus aureus (BCID) NOT DETECTED NOT DETECTED Final   Staphylococcus epidermidis NOT DETECTED NOT DETECTED Final   Staphylococcus lugdunensis NOT DETECTED NOT DETECTED Final   Streptococcus species NOT DETECTED NOT DETECTED Final   Streptococcus agalactiae  NOT DETECTED NOT DETECTED Final   Streptococcus pneumoniae NOT DETECTED NOT DETECTED Final   Streptococcus pyogenes NOT DETECTED NOT DETECTED Final   A.calcoaceticus-baumannii NOT DETECTED NOT DETECTED Final   Bacteroides fragilis NOT DETECTED NOT DETECTED Final   Enterobacterales NOT DETECTED NOT DETECTED Final   Enterobacter cloacae complex NOT DETECTED NOT DETECTED Final   Escherichia coli NOT DETECTED NOT DETECTED Final   Klebsiella aerogenes NOT DETECTED NOT DETECTED Final   Klebsiella oxytoca NOT DETECTED NOT DETECTED Final   Klebsiella pneumoniae NOT DETECTED NOT DETECTED Final   Proteus species NOT DETECTED NOT DETECTED Final   Salmonella species NOT DETECTED NOT DETECTED Final   Serratia marcescens NOT DETECTED NOT DETECTED Final   Haemophilus influenzae NOT DETECTED NOT DETECTED Final   Neisseria meningitidis NOT DETECTED NOT DETECTED Final   Pseudomonas aeruginosa NOT DETECTED NOT DETECTED Final   Stenotrophomonas maltophilia NOT DETECTED NOT DETECTED Final   Candida albicans NOT DETECTED NOT DETECTED Final   Candida auris NOT DETECTED NOT DETECTED Final   Candida glabrata NOT DETECTED NOT DETECTED Final   Candida krusei NOT DETECTED NOT DETECTED Final   Candida parapsilosis NOT DETECTED NOT DETECTED Final   Candida tropicalis NOT DETECTED NOT DETECTED Final   Cryptococcus neoformans/gattii NOT DETECTED NOT DETECTED Final   Vancomycin resistance NOT DETECTED NOT DETECTED Final    Comment: Performed at [facility], [address], [place], [place] [address]  Urine Culture     Status: Abnormal   Collection Time: 08/14/22  6:54 AM   Specimen: Urine, Clean Catch  Result Value Ref Range Status   Specimen Description   Final    URINE, CLEAN CATCH Performed at [facility], [address] [place], [place] [address]    Special Requests   Final    NONE Performed at [facility], [address] [place],  [address]    Culture >=100,000  COLONIES/mL ENTEROCOCCUS FAECALIS (A)  Final   Report Status 08/16/2022 FINAL  Final   Organism ID, Bacteria ENTEROCOCCUS FAECALIS (A)  Final      Susceptibility   Enterococcus faecalis - MIC*    AMPICILLIN <=2 SENSITIVE Sensitive     NITROFURANTOIN <=16 SENSITIVE Sensitive     VANCOMYCIN 1 SENSITIVE Sensitive     * >=100,000 COLONIES/mL ENTEROCOCCUS FAECALIS  Culture, blood (Routine X 2) w Reflex to ID Panel  Status: None   Collection Time: 08/15/22 12:22 PM   Specimen: Right Antecubital; Blood  Result Value Ref Range Status   Specimen Description RIGHT ANTECUBITAL  Final   Special Requests   Final    BOTTLES DRAWN AEROBIC AND ANAEROBIC Blood Culture adequate volume   Culture   Final    NO GROWTH 5 DAYS Performed at [facility], [address], [place], [place] [address]    Report Status 08/20/2022 FINAL  Final  Culture, blood (Routine X 2) w Reflex to ID Panel     Status: None   Collection Time: 08/15/22 12:22 PM   Specimen: BLOOD RIGHT ARM  Result Value Ref Range Status   Specimen Description BLOOD RIGHT ARM  Final   Special Requests   Final    BOTTLES DRAWN AEROBIC AND ANAEROBIC Blood Culture adequate volume   Culture   Final    NO GROWTH 5 DAYS Performed at [facility], [address] [place], [place] [address]    Report Status 08/20/2022 FINAL  Final    Coagulation Studies: No results for input(s): "LABPROT", "INR" in the last 72 hours.  Urinalysis: No results for input(s): "COLORURINE", "LABSPEC", "PHURINE", "GLUCOSEU", "HGBUR", "BILIRUBINUR", "KETONESUR", "PROTEINUR", "UROBILINOGEN", "NITRITE", "LEUKOCYTESUR" in the last 72 hours.  Invalid input(s): "APPERANCEUR"     Imaging: No results found.   Medications:    sodium chloride 75 mL/hr at 08/22/22 1000   ampicillin (OMNIPEN) IV Stopped (08/22/22 0823)    amLODipine  10 mg Oral Daily   atorvastatin  40 mg Oral QHS   calcitRIOL  0.25 mcg Oral q AM   clopidogrel  75 mg Oral Daily    donepezil  10 mg Oral QHS   ezetimibe  10 mg Oral QPM   ferrous sulfate  325 mg Oral Q2000   heparin injection (subcutaneous)  5,000 Units Subcutaneous Q8H   ipratropium  2-4 spray Each Nare QHS   lidocaine  1 patch Transdermal Q24H   loratadine  10 mg Oral Daily   melatonin  10 mg Oral QHS   methocarbamol  500 mg Oral TID   metoprolol succinate  25 mg Oral Daily   multivitamin with minerals  1 tablet Oral QPC lunch   oxyCODONE-acetaminophen  2 tablet Oral Q6H   pantoprazole  40 mg Oral QAC breakfast   polyethylene glycol  17 g Oral BID   predniSONE  40 mg Oral Q breakfast   senna-docusate  2 tablet Oral BID   zolpidem  10 mg Oral QHS   acetaminophen, albuterol, chlorproMAZINE, dextromethorphan-guaiFENesin, hydrALAZINE, HYDROmorphone (DILAUDID) injection, ondansetron (ZOFRAN) IV  Assessment/ Plan:  81 y.o. 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, history of stroke   admitted on 08/14/2022 for SOB (shortness of breath) [R06.02] Bronchitis [J40] Elevated troponin [R79.89] COPD exacerbation (HCC) [J44.1] CAP (community acquired pneumonia) [J18.9] Generalized weakness [R53.1] AKI (acute kidney injury) ([place]) [N17.9] Acute midline low back pain without sciatica [M54.50]   Impression 1-Acute kidney injury Patient had AKI most likely secondary to contrast-induced nephropathy Patient peak creatinine was at 2.7 on October 7 now it has come down to his baseline of around 2.0  2-CKD stage IV Patient has CKD most likely secondary to hypertension With some contribution from age associated  decline as patient is 81 years old Kidney function is at baseline.  Urinalysis shows small proteinuria.  3-Anemia of chronic disease Patient hemoglobin is low No need for transfusion for now   4-E  faecalis bacteremia Patient is being followed by ID  5-Diastolic CHF   Patient is well compensated   2D echo from 06/14/2022 shows LVEF 60 to 44%, grade 1 diastolic dysfunction  6-Chronic metabolic acidosis  patient bicarb is at around 21 no need for p.o. bicarb for now   Plan: Creatinine stable.    Primary team to continue IV antibiotics.       LOS: Elizabeth 10/15/20231:06 [facility] [place], [place]  Note: This note was prepared with Dragon dictation. Any transcription errors are unintentional

## 2022-08-22 NOTE — Progress Notes (Addendum)
PROGRESS NOTE    Rick Mcbride.  ZYY:[id number] DOB: 1940/12/26 DOA: 08/14/2022 PCP: Idelle Crouch, MD    Brief Narrative:  Rick Mcbride. is a 81 y.o. 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 Patient had surgery and finished SNF rehab. Currently he is doing PT/OT at home), chronic lower back pain (L5-S1 fusion), who presents with lower back pain and shortness breath. Pt had negative PE by V/Q scan which is ordered by his PCP day PTA, which was done for persistent SOB postoperatively. 10/07: WBC 3.8, negative COVID PCR, lactic acid 0.9, troponin level 31, 30, slightly worsening renal function with creatinine 2.69, BUN 31, GFR 23 (recent baseline creatinine 2.39 06/15/2022), temperature normal, blood pressure 172/84, 129/64, RR 83, heart rate 83, RR 17, oxygen saturation 98% on room air.  Chest x-ray showed possible mild patchy infiltration in left lower lobe.  CT head negative.  CT scan showed enlargement of AAA from previous 6.9 to 7.6 cm now.  Patient is admitted to telemetry bed as inpatient. Dr. Lorenso Courier of [facility] was consulted re: AAA, needs CTA, nephrology consulted and recommended IV fluids prior to CTA of Abd/Pelvis. Cardiology also consulted for surgical optimization/risk stratification - medically optimized at this time, troponin mild elevation likely d/t CKD4. BCx (+) E Faecalis, ID recommended r/o endocarditis/pacer infection w/ TEE and vascular surgery to assess re: graft involvement, changed Abx to ampicillin and repeat Bcx, also recommend MRI L-spine or CT w/ contrast, pt has pacemaker   10/08:  Pt and wife agreeable to TEE and MRI, though MRI would have to be at [facility]. Per vascular: "Noted bacteremia- however, patient with chronic progressive lower back pain; expanding aortic sac may be contributory to pain- not definitive. Therefore, preferable to intervene sooner than later." Per ID, would consider in order of importance to 1) treat bacteremia, 2) address aorto-bifem leak, 3) TEE, 4) MRI/CT L-spine. IF we decide to do CT, this can be done in-house, vs RI would require transfer. Will plan to proceed w/ IV abx, do vascular procedure tomorrow as scheduled, and see how he progresses from there.  10/09: Vascular procedure today to fix graft.  Radiology reached out this morning, concern for small PE noted on CTA abdomen/pelvis.  Holding off on full CTA chest at this time due to concern with contrast/renal function but will plan for heparin. 10/10: Renal function okay. CT w/ contrast lumbar spine - no osteomyelitis noted. CTA chest no PE but Significant mucus within RIGHT mainstem bronchus extending into bronchus intermedius and RIGHT lower lobe. Continue IV fluids and recheck BMP in AM. TEE planned for 10/12.  10/11 TEE plan for tomorrow. Palliative consulted 10/12 TEE this am. [facility] declined any transfer to [facility] for imaging /MRI back. 10/13 c/o back pain 10/14 family had discussion with hospice and would like to proceed with MRI.told wife need to make sure pacer compatable with mri at [facility] 10/15 wife reports pt slept well last night and no issues  Consultants:  Vascular surgery - TAA, AAA enlarged, R common iliac aneurysm Nephrology - CKD4 needing contrast study Cardiology - clearance for vascular procedure given CAD/CABG, Chronic HFpEF ID - (+)  Bcx  Procedures: 08/16/2022: Repair of aortic aneurysm sac type II endoleak  Antimicrobials:  Ampicillin Ceftriaxone   Subjective: Sleepy this am. No sob, or cp  Objective: Vitals:   08/22/22 0029 08/22/22 0534 08/22/22 0811 08/22/22 1158   BP: (!) 128/58 (!) 155/69 139/78 117/66  Pulse: 93 95 84 97  Resp: 20 20 19 18   Temp: 97.9 F (36.6 C) 99 F (37.2 C) 98.6 F (37 C) (!) 97.4 F (36.3 C)  TempSrc: Axillary Oral  Oral  SpO2: 96% 95% 96% 96%  Weight:      Height:        Intake/Output Summary (Last 24 hours) at 08/22/2022 1449 Last data filed at 08/22/2022 1000 Gross per 24 hour  Intake 540 ml  Output 300 ml  Net 240 ml   Filed Weights   08/16/22 0226 08/19/22 0512 08/19/22 0748  Weight: 86.1 kg 93.4 kg 93.4 kg    Examination: Calm, NAD Cta no w/r Reg s1/s2 no gallop Soft benign +bs Mild LE edema Sleepy , unable to assess Mood and affect appropriate in current setting    Data Reviewed: I have personally reviewed following labs and imaging studies  CBC: Recent Labs  Lab 08/16/22 0403 08/17/22 0530 08/18/22 0406 08/22/22 0519  WBC 5.4 5.5 7.2 10.6*  HGB 7.9* 8.0* 9.4* 7.9*  HCT 25.4* 26.1* 30.0* 25.0*  MCV 91.0 91.3 88.8 88.7  PLT 155 136* 193 427   Basic Metabolic Panel: Recent Labs  Lab 08/16/22 0403 08/17/22 0530 08/18/22 0406 08/20/22 0606 08/22/22 0519  NA 138 138 141  --  138  K 4.5 3.9 3.9  --  3.5  CL 109 109 109  --  109  CO2 22 22 20*  --  21*  GLUCOSE 106* 88 115*  --  117*  BUN 30* 28* 30*  --  32*  CREATININE 2.10* 2.06* 2.05* 2.05* 1.99*  CALCIUM 8.6* 8.4* 8.8*  --  8.4*  PHOS  --   --   --   --  3.6   GFR: Estimated Creatinine Clearance: 34 mL/min (A) (by C-G formula based on SCr of 1.99 mg/dL (H)). Liver Function Tests: Recent Labs  Lab 08/17/22 0530 08/22/22 0519  AST 16  --   ALT 15  --   ALKPHOS 52  --   BILITOT 0.7  --   PROT 5.2*  --   ALBUMIN 2.7* 2.3*   No results for input(s): "LIPASE", "AMYLASE" in the last 168 hours. No results for input(s): "AMMONIA" in the last 168 hours. Coagulation Profile: Recent Labs  Lab 08/17/22 1314  INR 1.2   Cardiac Enzymes: No results for input(s): "CKTOTAL", "CKMB", "CKMBINDEX", "TROPONINI" in the last 168  hours. BNP (last 3 results) No results for input(s): "PROBNP" in the last 8760 hours. HbA1C: No results for input(s): "HGBA1C" in the last 72 hours. CBG: No results for input(s): "GLUCAP" in the last 168 hours. Lipid Profile: No results for input(s): "CHOL", "HDL", "LDLCALC", "TRIG", "CHOLHDL", "LDLDIRECT" in the last 72 hours. Thyroid Function Tests: No results for input(s): "TSH", "T4TOTAL", "FREET4", "T3FREE", "THYROIDAB" in the last 72 hours. Anemia Panel: No results for input(s): "VITAMINB12", "FOLATE", "FERRITIN", "TIBC", "IRON", "RETICCTPCT" in the last 72 hours. Sepsis Labs: No results for input(s): "PROCALCITON", "LATICACIDVEN" in the last 168 hours.   Recent Results (from the past 240 hour(s))  Culture, blood (routine x 2)     Status: Abnormal   Collection Time: 08/14/22  3:57 AM   Specimen: BLOOD  Result Value Ref Range Status   Specimen Description   Final    BLOOD BLOOD RIGHT ARM Performed at [facility], [address], [place], [place] [address]    Special Requests   Final    BOTTLES DRAWN AEROBIC AND ANAEROBIC Blood Culture adequate volume Performed at [facility], [address] [place], [place] [address]    Culture  Setup Time   Final    Organism ID to follow GRAM POSITIVE COCCI IN BOTH AEROBIC AND ANAEROBIC BOTTLES CRITICAL RESULT CALLED TO, READ BACK BY AND VERIFIED WITH: PHARMD CHILDS AT 1957 08/14/2022 GAA Performed at [facility], [address] [place], [place] [address]    Culture ENTEROCOCCUS FAECALIS (A)  Final   Report Status 08/17/2022 FINAL  Final   Organism ID, Bacteria ENTEROCOCCUS FAECALIS  Final      Susceptibility   Enterococcus faecalis - MIC*    AMPICILLIN <=2 SENSITIVE Sensitive     VANCOMYCIN 1 SENSITIVE Sensitive     GENTAMICIN SYNERGY SENSITIVE Sensitive     * ENTEROCOCCUS FAECALIS  Culture, blood (routine x 2)     Status: Abnormal   Collection Time: 08/14/22  3:57 AM   Specimen: BLOOD  Result  Value Ref Range Status   Specimen Description   Final    BLOOD BLOOD LEFT ARM Performed at [facility], [address] [place], [place] [address]    Special Requests   Final    BOTTLES DRAWN AEROBIC AND ANAEROBIC Blood Culture adequate volume Performed at [facility], [address] [place], [place] [address]    Culture  Setup Time   Final    GRAM POSITIVE COCCI AEROBIC BOTTLE ONLY CRITICAL RESULT CALLED TO, READ BACK BY AND VERIFIED WITH: PHARMD CHILDS AT [place] 08/14/2022 GAA GRAM STAIN REVIEWED-AGREE WITH RESULT Performed at [facility], [address] [place], [place] [address]    Culture (A)  Final    ENTEROCOCCUS FAECALIS SUSCEPTIBILITIES PERFORMED ON PREVIOUS CULTURE WITHIN THE LAST 5 DAYS. Performed at [facility], [place] [address], [place], [place] [address]    Report Status 08/17/2022 FINAL  Final  Resp Panel by RT-PCR (Flu A&B, Covid) Anterior Nasal Swab     Status: None   Collection Time: 08/14/22  3:57 AM   Specimen: Anterior Nasal Swab  Result Value Ref Range Status   SARS Coronavirus 2 by RT PCR NEGATIVE NEGATIVE Final    Comment: (NOTE) SARS-CoV-2 target nucleic acids are NOT DETECTED.  The SARS-CoV-2 RNA is generally detectable in upper respiratory specimens during the acute phase of infection. The lowest concentration of SARS-CoV-2 viral copies this assay can detect is 138 copies/mL. A negative result does not preclude SARS-Cov-2 infection and should not be used as the sole basis for treatment or other patient management decisions. A negative result may occur with  improper specimen collection/handling, submission of specimen other than nasopharyngeal swab, presence of viral mutation(s) within the areas targeted by this assay, and inadequate number of viral copies(<138 copies/mL). A negative result must be combined with clinical observations, patient history, and epidemiological information. The expected result is  Negative.  Fact Sheet for Patients:  EntrepreneurPulse.com.au  Fact Sheet for Healthcare Providers:  IncredibleEmployment.be  This test is no t yet approved or cleared by the [place] FDA and  has been authorized for detection and/or diagnosis of SARS-CoV-2 by FDA under an Emergency Use Authorization (EUA). This EUA will remain  in effect (meaning this test can  be used) for the duration of the COVID-19 declaration under Section 564(b)(1) of the Act, 21 U.S.C.section 360bbb-3(b)(1), unless the authorization is terminated  or revoked sooner.       Influenza A by PCR NEGATIVE NEGATIVE Final   Influenza B by PCR NEGATIVE NEGATIVE Final    Comment: (NOTE) The Xpert Xpress SARS-CoV-2/FLU/RSV plus assay is intended as an aid in the diagnosis of influenza from Nasopharyngeal swab specimens and should not be used as a sole basis for treatment. Nasal washings and aspirates are unacceptable for Xpert Xpress SARS-CoV-2/FLU/RSV testing.  Fact Sheet for Patients: EntrepreneurPulse.com.au  Fact Sheet for Healthcare Providers: IncredibleEmployment.be  This test is not yet approved or cleared by the [place] FDA and has been authorized for detection and/or diagnosis of SARS-CoV-2 by FDA under an Emergency Use Authorization (EUA). This EUA will remain in effect (meaning this test can be used) for the duration of the COVID-19 declaration under Section 564(b)(1) of the Act, 21 U.S.C. section 360bbb-3(b)(1), unless the authorization is terminated or revoked.  Performed at [facility], [address] [place], [place] [address]   Blood Culture ID Panel (Reflexed)     Status: Abnormal   Collection Time: 08/14/22  3:57 AM  Result Value Ref Range Status   Enterococcus faecalis DETECTED (A) NOT DETECTED Final    Comment: CRITICAL RESULT CALLED TO, READ BACK BY AND VERIFIED WITH: PHARMD CHILDS AT 1957  08/14/2022 GAA    Enterococcus Faecium NOT DETECTED NOT DETECTED Final   Listeria monocytogenes NOT DETECTED NOT DETECTED Final   Staphylococcus species NOT DETECTED NOT DETECTED Final   Staphylococcus aureus (BCID) NOT DETECTED NOT DETECTED Final   Staphylococcus epidermidis NOT DETECTED NOT DETECTED Final   Staphylococcus lugdunensis NOT DETECTED NOT DETECTED Final   Streptococcus species NOT DETECTED NOT DETECTED Final   Streptococcus agalactiae NOT DETECTED NOT DETECTED Final   Streptococcus pneumoniae NOT DETECTED NOT DETECTED Final   Streptococcus pyogenes NOT DETECTED NOT DETECTED Final   A.calcoaceticus-baumannii NOT DETECTED NOT DETECTED Final   Bacteroides fragilis NOT DETECTED NOT DETECTED Final   Enterobacterales NOT DETECTED NOT DETECTED Final   Enterobacter cloacae complex NOT DETECTED NOT DETECTED Final   Escherichia coli NOT DETECTED NOT DETECTED Final   Klebsiella aerogenes NOT DETECTED NOT DETECTED Final   Klebsiella oxytoca NOT DETECTED NOT DETECTED Final   Klebsiella pneumoniae NOT DETECTED NOT DETECTED Final   Proteus species NOT DETECTED NOT DETECTED Final   Salmonella species NOT DETECTED NOT DETECTED Final   Serratia marcescens NOT DETECTED NOT DETECTED Final   Haemophilus influenzae NOT DETECTED NOT DETECTED Final   Neisseria meningitidis NOT DETECTED NOT DETECTED Final   Pseudomonas aeruginosa NOT DETECTED NOT DETECTED Final   Stenotrophomonas maltophilia NOT DETECTED NOT DETECTED Final   Candida albicans NOT DETECTED NOT DETECTED Final   Candida auris NOT DETECTED NOT DETECTED Final   Candida glabrata NOT DETECTED NOT DETECTED Final   Candida krusei NOT DETECTED NOT DETECTED Final   Candida parapsilosis NOT DETECTED NOT DETECTED Final   Candida tropicalis NOT DETECTED NOT DETECTED Final   Cryptococcus neoformans/gattii NOT DETECTED NOT DETECTED Final   Vancomycin resistance NOT DETECTED NOT DETECTED Final    Comment: Performed at [facility],  [address], [place], [place] [address]  Urine Culture     Status: Abnormal   Collection Time: 08/14/22  6:54 AM   Specimen: Urine, Clean Catch  Result Value Ref Range Status   Specimen Description   Final  URINE, CLEAN CATCH Performed at [facility], [address] [place], [place] [address]    Special Requests   Final    NONE Performed at [facility], [address] [place], [place] [address]    Culture >=100,000 COLONIES/mL ENTEROCOCCUS FAECALIS (A)  Final   Report Status 08/16/2022 FINAL  Final   Organism ID, Bacteria ENTEROCOCCUS FAECALIS (A)  Final      Susceptibility   Enterococcus faecalis - MIC*    AMPICILLIN <=2 SENSITIVE Sensitive     NITROFURANTOIN <=16 SENSITIVE Sensitive     VANCOMYCIN 1 SENSITIVE Sensitive     * >=100,000 COLONIES/mL ENTEROCOCCUS FAECALIS  Culture, blood (Routine X 2) w Reflex to ID Panel     Status: None   Collection Time: 08/15/22 12:22 PM   Specimen: Right Antecubital; Blood  Result Value Ref Range Status   Specimen Description RIGHT ANTECUBITAL  Final   Special Requests   Final    BOTTLES DRAWN AEROBIC AND ANAEROBIC Blood Culture adequate volume   Culture   Final    NO GROWTH 5 DAYS Performed at [facility], [address], [place], [place] [address]    Report Status 08/20/2022 FINAL  Final  Culture, blood (Routine X 2) w Reflex to ID Panel     Status: None   Collection Time: 08/15/22 12:22 PM   Specimen: BLOOD RIGHT ARM  Result Value Ref Range Status   Specimen Description BLOOD RIGHT ARM  Final   Special Requests   Final    BOTTLES DRAWN AEROBIC AND ANAEROBIC Blood Culture adequate volume   Culture   Final    NO GROWTH 5 DAYS Performed at [facility], [address] [place],  [address]    Report Status 08/20/2022 FINAL  Final         Radiology Studies: No results found.      Scheduled Meds:  amLODipine  10 mg Oral Daily   atorvastatin  40 mg Oral QHS    calcitRIOL  0.25 mcg Oral q AM   clopidogrel  75 mg Oral Daily   donepezil  10 mg Oral QHS   ezetimibe  10 mg Oral QPM   ferrous sulfate  325 mg Oral Q2000   heparin injection (subcutaneous)  5,000 Units Subcutaneous Q8H   ipratropium  2-4 spray Each Nare QHS   lidocaine  1 patch Transdermal Q24H   loratadine  10 mg Oral Daily   melatonin  10 mg Oral QHS   methocarbamol  500 mg Oral TID   metoprolol succinate  25 mg Oral Daily   multivitamin with minerals  1 tablet Oral QPC lunch   oxyCODONE-acetaminophen  2 tablet Oral Q6H   pantoprazole  40 mg Oral QAC breakfast   polyethylene glycol  17 g Oral BID   predniSONE  40 mg Oral Q breakfast   senna-docusate  2 tablet Oral BID   zolpidem  10 mg Oral QHS   Continuous Infusions:  sodium chloride 75 mL/hr at 08/22/22 1000   ampicillin (OMNIPEN) IV Stopped (08/22/22 1517)    Assessment & Plan:   Principal Problem:   Complication of aortic graft Active Problems:   COPD exacerbation (HCC)   AAA (abdominal aortic aneurysm) without rupture (HCC)   AKI (acute kidney injury) ([place])   Thoracic aortic aneurysm without rupture ([place])   Aneurysm of right common iliac artery (HCC)   CAD S/P CABG x 3   Myocardial injury   Essential hypertension  Chronic diastolic CHF (congestive heart failure) (HCC)   Chronic kidney disease, stage IV (severe) (HCC)   HLD (hyperlipidemia)   Iron deficiency anemia   TIA (transient ischemic attack)   Mild cognitive impairment   Low back pain   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 Lucky Cowboy.  Now the size has enlarged from 6.9 to 7.6 cm 10/15 status post repair on 10/9 by Dr. Lucky Cowboy      Severe back pain Lumbar OA, DDD Multiple lobar spine hardware 10/11 ct spine with degenerative disc dz, no acute issues 10/12 declined MRI of spine to be done at [facility].  Wife stated he is too weak to be being transferred and they have opted not to be transferred anywhere for any kind of imaging 10/14 Robaxin was changed from as needed to standing dose.  Still with pain.  Will coordinate MRI with contrast of spine at [facility] once we have info about his pacemaker compatibility.  10/15 trying to coordinate with MRI at [facility].  Received information about his pacemaker to see if it is compatible and will follow-up with radiology      CAD S/P CABG x 3 Hx of CAD s/p of CABG and myocardial injury due to COPD exacerbation:  Myocardial injury less likely vs Troponin elevation d/t CKD4 Troponin level 31, 30.  Denies chest pain. Continue home Lipitor, Zetia hold ASA consulted Dr. Saralyn Pilar of cardiology per Dr. Dolores Hoose recommendation - optimized from cardiac standpoint, though risk is present. Troponins likely d/t CKD 10/15 TEE neg. For vegetation     Low back pain This seems to be a chronic issue, has worsened recently. 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 There is osteopenia degenerative change of the lumbar spine with discogenic mild grade 1 retrolisthesis unchanged at L1-2 and L2-3, slight dextroscoliosis. pain control: As needed Percocet, Tylenol, Dilaudid --> scheduled po opiates w/ prn Dilaudid As needed Robaxin Lidoderm MRI L Spine per ID recs given (+)Bcx -patient has pacemaker, family would like to avoid transferring to different facility if possible.  10/13 Robxin change to standing dose. 10/14 hospice spoke to family they have opted to proceed with MRI 10/15 as above   SOB w/ mucus plugging of bronchi noted  on CT PE resolved on CTA (recently diagnosed, see above)  Transition heparin to DVT ppx dose now that no PE Given SOB, may consider pulmonary consult to review CT and eval if bronchoscopy warranted...Marland KitchenMarland Kitchen. 10/15 resolved , monitor for now    AKI on Chronic kidney disease, stage IV (severe) (HCC) Slightly worsening than baseline. Today 08/17/22 Cr 2.06 (vs 2.69 on admission) Nephrology following - see note for recs re: contrast administration  Planning further contrast studies to eval PE and lumbar spine - increased fluids for now to 100 mL/h and plan to reduce this pending renal function tomorrow.  10/15 Imporved some.  AKI most likely secondary to contrast-induced nephropathy.  Now around his baseline.  Will need to follow-up with nephrology as outpatient   Positive blood cultures -  E Facealis Bacteremia ID to follow Ampicillin Tee no vegetation  Possible COPD exacerbation (HCC) - seems less likely given CT findings from today 10/10 see above Patient is a former smoker, smoked more than 20 years.  CT chest did not show focal infiltration, does not seem to have pneumonia.  Procalcitonin elevated at 0.39.   Patient does not meet criteria for sepsis. will admit to tele bed as inpatient Bronchodilators pt was given 80 mg of Solu-Medrol in ED --> will change to oral prednisone 40 mg daily Z pak  Mucinex for cough  Incentive spirometry Urine S. Pneumococcal and legionella antigen sputum culture Nasal cannula oxygen as needed to maintain O2 saturation 93% or greater if pt desat Consider pulmonary consult     Essential hypertension IV hydralazine as needed Metoprolol hold Cozaar since pt will receive contrast  10/13 add amlodipine. Increase amlodipine to 10mg  qd   Chronic diastolic CHF (congestive heart failure) ([place]) 2D echo on 06/14/2022 showed EF of 60 to 65% with grade 1 diastolic dysfunction.   CHF seem to be compensated.    HLD (hyperlipidemia) Continue to Lipitor and  zetia   Iron deficiency anemia Hemoglobin 8.6 (9.7 on 06/15/2022, and 8.6 on 06/13/2022), no active bleeding. Follow-up with CBC Continue iron supplement   History of TIA (transient ischemic attack) Continue Lipitor Hold ASA and Plavix with pending vascular surgeon's evaluation of his AAA.   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  No acute issues.   Obesity (BMI 30-39.9) BMI= 30.68  and BW= 99.8 Diet and exercise.   Encourage to lose weight.   Anxiety start prn Xanax 0.25 mg bid     DVT prophylaxis: holding pharmacologic given bleed risk , scd Code Status:dnr Family Communication: wife Disposition Plan:  Status is: Inpatient Remains inpatient appropriate because: iv treatment,with back pain. W/u pending    LOS: 8 days   Time spent: 35 min    Nolberto Hanlon, MD [facility] Pager [phone]  If 7PM-7AM, please contact night-coverage 08/22/2022, 2:49 PM

## 2022-08-23 DIAGNOSIS — I2699 Other pulmonary embolism without acute cor pulmonale: Secondary | ICD-10-CM | POA: Diagnosis not present

## 2022-08-23 DIAGNOSIS — R7881 Bacteremia: Secondary | ICD-10-CM | POA: Diagnosis not present

## 2022-08-23 DIAGNOSIS — M545 Low back pain, unspecified: Secondary | ICD-10-CM | POA: Diagnosis not present

## 2022-08-23 DIAGNOSIS — M4306 Spondylolysis, lumbar region: Secondary | ICD-10-CM

## 2022-08-23 DIAGNOSIS — I714 Abdominal aortic aneurysm, without rupture, unspecified: Secondary | ICD-10-CM | POA: Diagnosis not present

## 2022-08-23 DIAGNOSIS — T829XXA Unspecified complication of cardiac and vascular prosthetic device, implant and graft, initial encounter: Secondary | ICD-10-CM | POA: Diagnosis not present

## 2022-08-23 DIAGNOSIS — N179 Acute kidney failure, unspecified: Secondary | ICD-10-CM | POA: Diagnosis not present

## 2022-08-23 DIAGNOSIS — B952 Enterococcus as the cause of diseases classified elsewhere: Secondary | ICD-10-CM | POA: Diagnosis not present

## 2022-08-23 NOTE — Progress Notes (Signed)
[place], [place] 08/23/22  Subjective:   Hospital day # 9  Patient known to our practice from outpatient follow-up of CKD, with Dr Candiss Norse.  Patient was seen today on second floor.Patient wife was present in the room. Patient was resting comfortably in the bed      10/15 0701 - 10/16 0700 In: 1875 [I.V.:1375; IV Piggyback:500] Out: 900 [Urine:900] Lab Results  Component Value Date   CREATININE 1.99 (H) 08/22/2022   CREATININE 2.05 (H) 08/20/2022   CREATININE 2.05 (H) 08/18/2022     Objective:  Vital signs in last 24 hours:  Temp:  [97.4 F (36.3 C)-98.6 F (37 C)] 98.1 F (36.7 C) (10/16 0517) Pulse Rate:  [75-97] 83 (10/16 0517) Resp:  [16-20] 19 (10/16 0517) BP: (115-158)/(55-78) 158/76 (10/16 0517) SpO2:  [94 %-100 %] 98 % (10/16 0517) Weight:  [89.9 kg] 89.9 kg (10/16 0517)  Weight change:  Filed Weights   08/19/22 0512 08/19/22 0748 08/23/22 0517  Weight: 93.4 kg 93.4 kg 89.9 kg    Intake/Output:    Intake/Output Summary (Last 24 hours) at 08/23/2022 0701 Last data filed at 08/23/2022 0517 Gross per 24 hour  Intake 1875 ml  Output 900 ml  Net 975 ml     Physical Exam: General: elderly gentleman, laying in the bed  HEENT Anicteric, moist oral mucous membranes  Pulm/lungs Normal breathing effort, clear to auscultation  CVS/Heart No rub or gallop  Abdomen:  Soft, nontender  Extremities: No peripheral edema  Neurologic: Alert, oriented, pain with movement of right leg  Skin: No acute rashes          Basic Metabolic Panel:  Recent Labs  Lab 08/17/22 0530 08/18/22 0406 08/20/22 0606 08/22/22 0519  NA 138 141  --  138  K 3.9 3.9  --  3.5  CL 109 109  --  109  CO2 22 20*  --  21*  GLUCOSE 88 115*  --  117*  BUN 28* 30*  --  32*  CREATININE 2.06* 2.05* 2.05* 1.99*  CALCIUM 8.4* 8.8*  --  8.4*  PHOS  --   --   --  3.6     CBC: Recent Labs  Lab 08/17/22 0530 08/18/22 0406 08/22/22 0519  WBC 5.5 7.2  10.6*  HGB 8.0* 9.4* 7.9*  HCT 26.1* 30.0* 25.0*  MCV 91.3 88.8 88.7  PLT 136* 193 221     No results found for: "HEPBSAG", "HEPBSAB", "HEPBIGM"    Microbiology:  Recent Results (from the past 240 hour(s))  Culture, blood (routine x 2)     Status: Abnormal   Collection Time: 08/14/22  3:57 AM   Specimen: BLOOD  Result Value Ref Range Status   Specimen Description   Final    BLOOD BLOOD RIGHT ARM Performed at [facility], [address], [place], [place] [address]    Special Requests   Final    BOTTLES DRAWN AEROBIC AND ANAEROBIC Blood Culture adequate volume Performed at [facility], [address] [place], [place] [address]    Culture  Setup Time   Final    Organism ID to follow [place] TO, READ BACK BY AND VERIFIED WITH: PHARMD CHILDS AT [place] 08/14/2022 GAA Performed at [facility], [address], [place], [place] [address]    Culture ENTEROCOCCUS FAECALIS (A)  Final   Report Status 08/17/2022 FINAL  Final   Organism ID, Bacteria  ENTEROCOCCUS FAECALIS  Final      Susceptibility   Enterococcus faecalis - MIC*    AMPICILLIN <=2 SENSITIVE Sensitive     VANCOMYCIN 1 SENSITIVE Sensitive     GENTAMICIN SYNERGY SENSITIVE Sensitive     * ENTEROCOCCUS FAECALIS  Culture, blood (routine x 2)     Status: Abnormal   Collection Time: 08/14/22  3:57 AM   Specimen: BLOOD  Result Value Ref Range Status   Specimen Description   Final    BLOOD BLOOD LEFT ARM Performed at [facility], [address] [place], [place] [address]    Special Requests   Final    BOTTLES DRAWN AEROBIC AND ANAEROBIC Blood Culture adequate volume Performed at [facility], [address] [place], [place] [address]    Culture  Setup Time   Final    GRAM POSITIVE COCCI AEROBIC BOTTLE ONLY CRITICAL RESULT CALLED TO, READ BACK BY AND VERIFIED WITH: PHARMD CHILDS AT [place]  08/14/2022 GAA GRAM STAIN REVIEWED-AGREE WITH RESULT Performed at [facility], [address] [place], [place] [address]    Culture (A)  Final    ENTEROCOCCUS FAECALIS SUSCEPTIBILITIES PERFORMED ON PREVIOUS CULTURE WITHIN THE LAST 5 DAYS. Performed at [facility], [place] [address] [place], [place] [address]    Report Status 08/17/2022 FINAL  Final  Resp Panel by RT-PCR (Flu A&B, Covid) Anterior Nasal Swab     Status: None   Collection Time: 08/14/22  3:57 AM   Specimen: Anterior Nasal Swab  Result Value Ref Range Status   SARS Coronavirus 2 by RT PCR NEGATIVE NEGATIVE Final    Comment: (NOTE) SARS-CoV-2 target nucleic acids are NOT DETECTED.  The SARS-CoV-2 RNA is generally detectable in upper respiratory specimens during the acute phase of infection. The lowest concentration of SARS-CoV-2 viral copies this assay can detect is 138 copies/mL. A negative result does not preclude SARS-Cov-2 infection and should not be used as the sole basis for treatment or other patient management decisions. A negative result may occur with  improper specimen collection/handling, submission of specimen other than nasopharyngeal swab, presence of viral mutation(s) within the areas targeted by this assay, and inadequate number of viral copies(<138 copies/mL). A negative result must be combined with clinical observations, patient history, and epidemiological information. The expected result is Negative.  Fact Sheet for Patients:  EntrepreneurPulse.com.au  Fact Sheet for Healthcare Providers:  IncredibleEmployment.be  This test is no t yet approved or cleared by the [place] FDA and  has been authorized for detection and/or diagnosis of SARS-CoV-2 by FDA under an Emergency Use Authorization (EUA). This EUA will remain  in effect (meaning this test can be used) for the duration of the COVID-19 declaration under Section 564(b)(1) of the Act,  21 U.S.C.section 360bbb-3(b)(1), unless the authorization is terminated  or revoked sooner.       Influenza A by PCR NEGATIVE NEGATIVE Final   Influenza B by PCR NEGATIVE NEGATIVE Final    Comment: (NOTE) The Xpert Xpress SARS-CoV-2/FLU/RSV plus assay is intended as an aid in the diagnosis of influenza from Nasopharyngeal swab specimens and should not be used as a sole basis for treatment. Nasal washings and aspirates are unacceptable for Xpert Xpress SARS-CoV-2/FLU/RSV testing.  Fact Sheet for Patients: EntrepreneurPulse.com.au  Fact Sheet for Healthcare Providers: IncredibleEmployment.be  This test is not yet approved or cleared by the [place] FDA and has been authorized for detection and/or diagnosis of SARS-CoV-2 by FDA under an Emergency Use  Authorization (EUA). This EUA will remain in effect (meaning this test can be used) for the duration of the COVID-19 declaration under Section 564(b)(1) of the Act, 21 U.S.C. section 360bbb-3(b)(1), unless the authorization is terminated or revoked.  Performed at [facility], [address] [place], [place] [address]   Blood Culture ID Panel (Reflexed)     Status: Abnormal   Collection Time: 08/14/22  3:57 AM  Result Value Ref Range Status   Enterococcus faecalis DETECTED (A) NOT DETECTED Final    Comment: CRITICAL RESULT CALLED TO, READ BACK BY AND VERIFIED WITH: PHARMD CHILDS AT 1957 08/14/2022 GAA    Enterococcus Faecium NOT DETECTED NOT DETECTED Final   Listeria monocytogenes NOT DETECTED NOT DETECTED Final   Staphylococcus species NOT DETECTED NOT DETECTED Final   Staphylococcus aureus (BCID) NOT DETECTED NOT DETECTED Final   Staphylococcus epidermidis NOT DETECTED NOT DETECTED Final   Staphylococcus lugdunensis NOT DETECTED NOT DETECTED Final   Streptococcus species NOT DETECTED NOT DETECTED Final   Streptococcus agalactiae NOT DETECTED NOT DETECTED Final   Streptococcus  pneumoniae NOT DETECTED NOT DETECTED Final   Streptococcus pyogenes NOT DETECTED NOT DETECTED Final   A.calcoaceticus-baumannii NOT DETECTED NOT DETECTED Final   Bacteroides fragilis NOT DETECTED NOT DETECTED Final   Enterobacterales NOT DETECTED NOT DETECTED Final   Enterobacter cloacae complex NOT DETECTED NOT DETECTED Final   Escherichia coli NOT DETECTED NOT DETECTED Final   Klebsiella aerogenes NOT DETECTED NOT DETECTED Final   Klebsiella oxytoca NOT DETECTED NOT DETECTED Final   Klebsiella pneumoniae NOT DETECTED NOT DETECTED Final   Proteus species NOT DETECTED NOT DETECTED Final   Salmonella species NOT DETECTED NOT DETECTED Final   Serratia marcescens NOT DETECTED NOT DETECTED Final   Haemophilus influenzae NOT DETECTED NOT DETECTED Final   Neisseria meningitidis NOT DETECTED NOT DETECTED Final   Pseudomonas aeruginosa NOT DETECTED NOT DETECTED Final   Stenotrophomonas maltophilia NOT DETECTED NOT DETECTED Final   Candida albicans NOT DETECTED NOT DETECTED Final   Candida auris NOT DETECTED NOT DETECTED Final   Candida glabrata NOT DETECTED NOT DETECTED Final   Candida krusei NOT DETECTED NOT DETECTED Final   Candida parapsilosis NOT DETECTED NOT DETECTED Final   Candida tropicalis NOT DETECTED NOT DETECTED Final   Cryptococcus neoformans/gattii NOT DETECTED NOT DETECTED Final   Vancomycin resistance NOT DETECTED NOT DETECTED Final    Comment: Performed at [facility], [address] [place], [place] [address]  Urine Culture     Status: Abnormal   Collection Time: 08/14/22  6:54 AM   Specimen: Urine, Clean Catch  Result Value Ref Range Status   Specimen Description   Final    URINE, CLEAN CATCH Performed at [facility], [address] [place], [place] [address]    Special Requests   Final    NONE Performed at [facility], [address] [place], [place] [address]    Culture >=100,000 COLONIES/mL ENTEROCOCCUS FAECALIS (A)  Final    Report Status 08/16/2022 FINAL  Final   Organism ID, Bacteria ENTEROCOCCUS FAECALIS (A)  Final      Susceptibility   Enterococcus faecalis - MIC*    AMPICILLIN <=2 SENSITIVE Sensitive     NITROFURANTOIN <=16 SENSITIVE Sensitive     VANCOMYCIN 1 SENSITIVE Sensitive     * >=100,000 COLONIES/mL ENTEROCOCCUS FAECALIS  Culture, blood (Routine X 2) w Reflex to ID Panel     Status: None   Collection Time: 08/15/22 12:22 PM  Specimen: Right Antecubital; Blood  Result Value Ref Range Status   Specimen Description RIGHT ANTECUBITAL  Final   Special Requests   Final    BOTTLES DRAWN AEROBIC AND ANAEROBIC Blood Culture adequate volume   Culture   Final    NO GROWTH 5 DAYS Performed at [facility], [address] [place], [place] [address]    Report Status 08/20/2022 FINAL  Final  Culture, blood (Routine X 2) w Reflex to ID Panel     Status: None   Collection Time: 08/15/22 12:22 PM   Specimen: BLOOD RIGHT ARM  Result Value Ref Range Status   Specimen Description BLOOD RIGHT ARM  Final   Special Requests   Final    BOTTLES DRAWN AEROBIC AND ANAEROBIC Blood Culture adequate volume   Culture   Final    NO GROWTH 5 DAYS Performed at [facility], [address], [place], [place] [address]    Report Status 08/20/2022 FINAL  Final    Coagulation Studies: No results for input(s): "LABPROT", "INR" in the last 72 hours.  Urinalysis: No results for input(s): "COLORURINE", "LABSPEC", "PHURINE", "GLUCOSEU", "HGBUR", "BILIRUBINUR", "KETONESUR", "PROTEINUR", "UROBILINOGEN", "NITRITE", "LEUKOCYTESUR" in the last 72 hours.  Invalid input(s): "APPERANCEUR"     Imaging: No results found.   Medications:    sodium chloride 75 mL/hr at 08/23/22 0420   ampicillin (OMNIPEN) IV Stopped (08/23/22 0256)    amLODipine  10 mg Oral Daily   atorvastatin  40 mg Oral QHS   calcitRIOL  0.25 mcg Oral q AM   clopidogrel  75 mg Oral Daily   donepezil  10 mg Oral QHS   ezetimibe  10  mg Oral QPM   ferrous sulfate  325 mg Oral Q2000   heparin injection (subcutaneous)  5,000 Units Subcutaneous Q8H   ipratropium  2-4 spray Each Nare QHS   lidocaine  1 patch Transdermal Q24H   loratadine  10 mg Oral Daily   melatonin  10 mg Oral QHS   methocarbamol  500 mg Oral TID   metoprolol succinate  25 mg Oral Daily   multivitamin with minerals  1 tablet Oral QPC lunch   oxyCODONE-acetaminophen  2 tablet Oral Q6H   pantoprazole  40 mg Oral QAC breakfast   polyethylene glycol  17 g Oral BID   predniSONE  40 mg Oral Q breakfast   senna-docusate  2 tablet Oral BID   zolpidem  10 mg Oral QHS   acetaminophen, albuterol, chlorproMAZINE, dextromethorphan-guaiFENesin, hydrALAZINE, HYDROmorphone (DILAUDID) injection, ondansetron (ZOFRAN) IV  Assessment/ Plan:  81 y.o. 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, history of stroke   admitted on 08/14/2022 for SOB (shortness of breath) [R06.02] Bronchitis [J40] Elevated troponin [R79.89] COPD exacerbation (HCC) [J44.1] CAP (community acquired pneumonia) [J18.9] Generalized weakness [R53.1] AKI (acute kidney injury) ([place]) [N17.9] Acute midline low back pain without sciatica [M54.50]   Impression 1-Acute kidney injury Patient had AKI most likely secondary to contrast-induced nephropathy Patient peak creatinine was at 2.7 on October 7 now it has come down to his baseline of around 2.0  2-CKD stage IV Patient has CKD most likely secondary to hypertension With some contribution from age associated  decline as patient is 81 years old Kidney function is at baseline.  Urinalysis shows small proteinuria.     3-Anemia of chronic disease Patient hemoglobin is low No need  for transfusion for now   4-E faecalis bacteremia Patient is being followed by  ID  5-Diastolic CHF   Patient is well compensated   2D echo from 06/14/2022 shows LVEF 60 to 74%, grade 1 diastolic dysfunction  6-Chronic metabolic acidosis  patient bicarb is at around 21 no need for p.o. bicarb for now   Plan: Creatinine stable.    Primary team to continue IV antibiotics.       LOS: [place] 10/16/20237:01 [place], [place]  Note: This note was prepared with Dragon dictation. Any transcription errors are unintentional

## 2022-08-23 NOTE — TOC CM/SW Note (Signed)
Per chart review, patient may transfer to [facility]. TOC will continue to follow progress.  Rick Mcbride, [place]

## 2022-08-23 NOTE — Progress Notes (Signed)
Physical Therapy Treatment Patient Details Name: Rick Mcbride. MRN: [id number] DOB: 15-Sep-1941 Today's Date: 08/23/2022   History of Present Illness Rick T Mikey Maffett. is a 81 y.o. 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     Patient was recently hospitalized from 8/2 - 8/8 due to right femoral neck fracture.  Patient had surgery and finished the rehab.  Currently he is doing PT/OT at home.  He states that in the past several days, his lower back pain has worsened, difficult walking and doing physical therapy.  The lower back pain is constant, 7 out of 10 in severity, aching, nonradiating.  No loss control of bladder or bowel movement.  Denies new injury.  Patient also reports shortness of breath and dry cough.  No chest pain, fever or chills.  Denies nausea, vomiting, diarrhea or abdominal pain. Pt still has some mild right hip pain.  No symptoms of UTI. Pt had negative PE by V/Q scan which is ordered by his PCP yesterday.    PT Comments    Pt received in bed, wife at bedside. Pt falling asleep during conversations. Discussed at length pt's current LOF with pt and wife. Poor tolerance for mobility due to pain, only able to complete B LE strengthening exercises. Will plan to see in am after receiving pain meds unless pt is out of facility for MRI of low back. Continue to recommend SNF once medically cleared for d/c.   Recommendations for follow up therapy are one component of a multi-disciplinary discharge planning process, led by the  attending physician.  Recommendations may be updated based on patient status, additional functional criteria and insurance authorization.  Follow Up Recommendations  Skilled nursing-short term rehab (<3 hours/day) Can patient physically be transported by private vehicle: No   Assistance Recommended at Discharge Frequent or constant Supervision/Assistance  Patient can return home with the following Two people to help with walking and/or transfers;Two people to help with bathing/dressing/bathroom;Direct supervision/assist for medications management;Assistance with feeding;Assistance with cooking/housework;A lot of help with walking and/or transfers;A lot of help with bathing/dressing/bathroom;Direct supervision/assist for financial management;Assist for transportation;Help with stairs or ramp for entrance   Equipment Recommendations  None recommended by PT    Recommendations for Other Services       Precautions / Restrictions Precautions Precautions: Fall;Back Precaution Booklet Issued: No Restrictions Weight Bearing Restrictions: No     Mobility  Bed Mobility                    Transfers                        Ambulation/Gait                   Stairs             Wheelchair Mobility    Modified Rankin (Stroke Patients Only)       Balance  Cognition Arousal/Alertness: Lethargic, Suspect due to medications Behavior During Therapy: Flat affect Overall Cognitive Status: History of cognitive impairments - at baseline                                 General Comments: pt with "mild cognitive impairment" at baseline, pt A&Ox3 (able to state name/DOB, location, grossly oriented to situation). Pt very lethargic during eval, required VC for alertness throughout        Exercises General Exercises - Lower Extremity Ankle Circles/Pumps: AROM, Both, 15 reps Heel Slides:  AROM, Both, 10 reps Hip ABduction/ADduction: AROM, Both, 10 reps Other Exercises Other Exercises: B hip IR x 15    General Comments        Pertinent Vitals/Pain Pain Assessment Pain Assessment: 0-10 Pain Score: 6  Pain Location: low back Pain Descriptors / Indicators: Aching, Discomfort, Grimacing, Sharp Pain Intervention(s): Limited activity within patient's tolerance, Monitored during session    Home Living                          Prior Function            PT Goals (current goals can now be found in the care plan section) Acute Rehab PT Goals Patient Stated Goal: Feel better    Frequency    Min 2X/week      PT Plan      Co-evaluation              AM-PAC PT "6 Clicks" Mobility   Outcome Measure  Help needed turning from your back to your side while in a flat bed without using bedrails?: A Lot Help needed moving from lying on your back to sitting on the side of a flat bed without using bedrails?: A Lot Help needed moving to and from a bed to a chair (including a wheelchair)?: A Lot Help needed standing up from a chair using your arms (e.g., wheelchair or bedside chair)?: A Lot Help needed to walk in hospital room?: A Lot Help needed climbing 3-5 steps with a railing? : A Lot 6 Click Score: 12    End of Session   Activity Tolerance: Patient limited by fatigue;Patient limited by lethargy;Patient limited by pain Patient left: in bed;with call bell/phone within reach;with bed alarm set;with SCD's reapplied Nurse Communication: Mobility status PT Visit Diagnosis: Unsteadiness on feet (R26.81);Other abnormalities of gait and mobility (R26.89);Muscle weakness (generalized) (M62.81);Difficulty in walking, not elsewhere classified (R26.2);Pain Pain - part of body:  (lower back)     Time: 8299-3716 PT Time Calculation (min) (ACUTE ONLY): 23 min  Charges:  $Therapeutic Exercise: 8-22 mins $Therapeutic Activity: 8-22 mins                     Mikel Cella, PTA    Josie Dixon 08/23/2022, 1:04 PM

## 2022-08-23 NOTE — Plan of Care (Signed)
  Problem: Education: Goal: Knowledge of disease or condition will improve Outcome: Progressing Goal: Knowledge of the prescribed therapeutic regimen will improve Outcome: Progressing Goal: Individualized Educational Video(s) Outcome: Progressing   Problem: Activity: Goal: Ability to tolerate increased activity will improve Outcome: Progressing Goal: Will verbalize the importance of balancing activity with adequate rest periods Outcome: Progressing   Problem: Respiratory: Goal: Ability to maintain a clear airway will improve Outcome: Progressing Goal: Levels of oxygenation will improve Outcome: Progressing Goal: Ability to maintain adequate ventilation will improve Outcome: Progressing   Problem: Activity: Goal: Ability to tolerate increased activity will improve Outcome: Progressing   Problem: Clinical Measurements: Goal: Ability to maintain a body temperature in the normal range will improve Outcome: Progressing   Problem: Respiratory: Goal: Ability to maintain adequate ventilation will improve Outcome: Progressing Goal: Ability to maintain a clear airway will improve Outcome: Progressing   Problem: Education: Goal: Knowledge of General Education information will improve Description: Including pain rating scale, medication(s)/side effects and non-pharmacologic comfort measures Outcome: Progressing   Problem: Health Behavior/Discharge Planning: Goal: Ability to manage health-related needs will improve Outcome: Progressing   Problem: Clinical Measurements: Goal: Ability to maintain clinical measurements within normal limits will improve Outcome: Progressing Goal: Will remain free from infection Outcome: Progressing Goal: Diagnostic test results will improve Outcome: Progressing Goal: Respiratory complications will improve Outcome: Progressing Goal: Cardiovascular complication will be avoided Outcome: Progressing   Problem: Activity: Goal: Risk for activity  intolerance will decrease Outcome: Progressing   Problem: Nutrition: Goal: Adequate nutrition will be maintained Outcome: Progressing   Problem: Coping: Goal: Level of anxiety will decrease Outcome: Progressing   Problem: Elimination: Goal: Will not experience complications related to bowel motility Outcome: Progressing Goal: Will not experience complications related to urinary retention Outcome: Progressing   Problem: Pain Managment: Goal: General experience of comfort will improve Outcome: Progressing   Problem: Safety: Goal: Ability to remain free from injury will improve Outcome: Progressing   Problem: Skin Integrity: Goal: Risk for impaired skin integrity will decrease Outcome: Progressing   Problem: Education: Goal: Knowledge of General Education information will improve Description: Including pain rating scale, medication(s)/side effects and non-pharmacologic comfort measures Outcome: Progressing   Problem: Health Behavior/Discharge Planning: Goal: Ability to manage health-related needs will improve Outcome: Progressing   Problem: Clinical Measurements: Goal: Ability to maintain clinical measurements within normal limits will improve Outcome: Progressing Goal: Will remain free from infection Outcome: Progressing Goal: Diagnostic test results will improve Outcome: Progressing Goal: Respiratory complications will improve Outcome: Progressing Goal: Cardiovascular complication will be avoided Outcome: Progressing   Problem: Activity: Goal: Risk for activity intolerance will decrease Outcome: Progressing   Problem: Nutrition: Goal: Adequate nutrition will be maintained Outcome: Progressing   Problem: Coping: Goal: Level of anxiety will decrease Outcome: Progressing   Problem: Elimination: Goal: Will not experience complications related to bowel motility Outcome: Progressing Goal: Will not experience complications related to urinary  retention Outcome: Progressing   Problem: Pain Managment: Goal: General experience of comfort will improve Outcome: Progressing   Problem: Safety: Goal: Ability to remain free from injury will improve Outcome: Progressing   Problem: Skin Integrity: Goal: Risk for impaired skin integrity will decrease Outcome: Progressing

## 2022-08-23 NOTE — Progress Notes (Signed)
ID Back pain seems to be slightly improved But still unable to get out of bed Wife at bed side   O/e Awake and alert Patient Vitals for the past 24 hrs:  BP Temp Temp src Pulse Resp SpO2 Weight  08/23/22 1217 (!) 141/70 -- -- 84 17 98 % --  08/23/22 0820 (!) 161/79 98.2 F (36.8 C) -- 100 18 98 % --  08/23/22 0517 (!) 158/76 98.1 F (36.7 C) Oral 83 19 98 % 89.9 kg  08/22/22 2323 122/63 98.4 F (36.9 C) Oral 75 16 95 % --  08/22/22 1933 (!) 121/55 98 F (36.7 C) Oral 79 20 94 % --  08/22/22 1712 115/60 97.8 F (36.6 C) -- 77 -- 100 % --   Chest b/l air entry [id number] Pacemaker Abd soft CNS moves all extremities  Labs    Latest Ref Rng & Units 08/22/2022    5:19 AM 08/18/2022    4:06 AM 08/17/2022    5:30 AM  CBC  WBC 4.0 - 10.5 K/uL 10.6  7.2  5.5   Hemoglobin 13.0 - 17.0 g/dL 7.9  9.4  8.0   Hematocrit 39.0 - 52.0 % 25.0  30.0  26.1   Platelets 150 - 400 K/uL 221  193  136        Latest Ref Rng & Units 08/22/2022    5:19 AM 08/20/2022    6:06 AM 08/18/2022    4:06 AM  CMP  Glucose 70 - 99 mg/dL 117   115   BUN 8 - 23 mg/dL 32   30   Creatinine 0.61 - 1.24 mg/dL 1.99  2.05  2.05   Sodium 135 - 145 mmol/L 138   141   Potassium 3.5 - 5.1 mmol/L 3.5   3.9   Chloride 98 - 111 mmol/L 109   109   CO2 22 - 32 mmol/L 21   20   Calcium 8.9 - 10.3 mg/dL 8.4   8.8     Micro BC- 08/14/22 Enterococcis UC - enterococcus 08/15/22 BC-NG   Impression/recommendation  Severe Low back pain- has previous L5-s1 fusion  discitis. Epidural abscess or osteo cannot be ruled out as MRI cannot be done due to pacemaker Will explore with radiology whether bone scan/tagged WBC scan would help  On  ampicillin will treat for 6 weeks  Enterococcus bacteremia-  Concern for endocarditis due to pacemaker and also  TEE neg for endocarditis  AAA- s/p endovascular stent- worsening aneurysm with endo leak 2. 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

## 2022-08-23 NOTE — Progress Notes (Signed)
PROGRESS NOTE    Rick Mcbride.  MWU:[id number] DOB: [birth year] DOA: 08/14/2022 PCP: Idelle Crouch, MD    Brief Narrative:  Rick Mcbride. is a 81 y.o. 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 Patient had surgery and finished SNF rehab. Currently he is doing PT/OT at home), chronic lower back pain (L5-S1 fusion), who presents with lower back pain and shortness breath. Pt had negative PE by V/Q scan which is ordered by his PCP day PTA, which was done for persistent SOB postoperatively. 10/07: WBC 3.8, negative COVID PCR, lactic acid 0.9, troponin level 31, 30, slightly worsening renal function with creatinine 2.69, BUN 31, GFR 23 (recent baseline creatinine 2.39 06/15/2022), temperature normal, blood pressure 172/84, 129/64, RR 83, heart rate 83, RR 17, oxygen saturation 98% on room air.  Chest x-ray showed possible mild patchy infiltration in left lower lobe.  CT head negative.  CT scan showed enlargement of AAA from previous 6.9 to 7.6 cm now.  Patient is admitted to telemetry bed as inpatient. Dr. Lorenso Courier of [facility] was consulted re: AAA, needs CTA, nephrology consulted and recommended IV fluids prior to CTA of Abd/Pelvis. Cardiology also consulted for surgical optimization/risk stratification - medically optimized at this time, troponin mild elevation likely d/t CKD4. BCx (+) E Faecalis, ID recommended r/o endocarditis/pacer infection w/ TEE and vascular surgery to assess re: graft involvement, changed Abx to ampicillin and repeat Bcx, also recommend MRI L-spine or CT w/ contrast, pt has pacemaker   10/08:  Pt and wife agreeable to TEE and MRI, though [facility] have to [facility]. Per vascular: "Noted bacteremia- however, patient with chronic progressive lower back pain; expanding aortic sac may be contributory to pain- not definitive. Therefore, preferable to intervene sooner than later." Per ID, would consider in order of importance to 1) treat bacteremia, 2) address aorto-bifem leak, 3) TEE, 4) MRI/CT L-spine. IF we decide to do CT, this can be done in-house, vs RI would require transfer. Will plan to proceed w/ IV abx, do vascular procedure tomorrow as scheduled, and see how he progresses from there.  10/09: Vascular procedure today to fix graft.  Radiology reached out this morning, concern for small PE noted on CTA abdomen/pelvis.  Holding off on full CTA chest at this time due to concern with contrast/renal function but will plan for heparin. 10/10: Renal function okay. CT w/ contrast lumbar spine - no osteomyelitis noted. CTA chest no PE but Significant mucus within RIGHT mainstem bronchus extending into bronchus intermedius and RIGHT lower lobe. Continue IV fluids and recheck BMP in AM. TEE planned for 10/12.  10/11 TEE plan for tomorrow. Palliative consulted 10/12 TEE this am. [facility] declined any transfer to [facility] for [facility] /MRI back. 10/13 c/o back pain 10/14 family had discussion with hospice and would like to proceed with MRI.told wife need to make sure pacer compatable with mri at [facility] 10/16 called Radiology /MRI, pts pacemaker not compatable, unable to obtain spine MRI. Neurosurgery consulted   Consultants:  Vascular surgery - TAA, AAA enlarged, R common iliac aneurysm Nephrology - CKD4 needing contrast study Cardiology - clearance for vascular procedure given  CAD/CABG, Chronic HFpEF ID - (+)Bcx  Procedures: 08/16/2022: Repair of aortic aneurysm sac type II endoleak  Antimicrobials:  Ampicillin Ceftriaxone   Subjective: Wife without any complaints this am.no sob or cp  reported  Objective: Vitals:   08/22/22 1933 08/22/22 2323 08/23/22 0517 08/23/22 0820  BP: (!) 121/55 122/63 (!) 158/76 (!) 161/79  Pulse: 79 75 83 100  Resp: 20 16 19 18   Temp: 98 F (36.7 C) 98.4 F (36.9 C) 98.1 F (36.7 C) 98.2 F (36.8 C)  TempSrc: Oral Oral Oral   SpO2: 94% 95% 98% 98%  Weight:   89.9 kg   Height:        Intake/Output Summary (Last 24 hours) at 08/23/2022 0903 Last data filed at 08/23/2022 0517 Gross per 24 hour  Intake 1875 ml  Output 900 ml  Net 975 ml   Filed Weights   08/19/22 0512 08/19/22 0748 08/23/22 0517  Weight: 93.4 kg 93.4 kg 89.9 kg    Examination: Calm, NAD Cta no w/r Reg s1/s2 no gallop Soft benign +bs Scd b/l Mood and affect appropriate in current setting    Data Reviewed: I have personally reviewed following labs and imaging studies  CBC: Recent Labs  Lab 08/17/22 0530 08/18/22 0406 08/22/22 0519  WBC 5.5 7.2 10.6*  HGB 8.0* 9.4* 7.9*  HCT 26.1* 30.0* 25.0*  MCV 91.3 88.8 88.7  PLT 136* 193 654   Basic Metabolic Panel: Recent Labs  Lab 08/17/22 0530 08/18/22 0406 08/20/22 0606 08/22/22 0519  NA 138 141  --  138  K 3.9 3.9  --  3.5  CL 109 109  --  109  CO2 22 20*  --  21*  GLUCOSE 88 115*  --  117*  BUN 28* 30*  --  32*  CREATININE 2.06* 2.05* 2.05* 1.99*  CALCIUM 8.4* 8.8*  --  8.4*  PHOS  --   --   --  3.6   GFR: Estimated Creatinine Clearance: 31 mL/min (A) (by C-G formula based on SCr of 1.99 mg/dL (H)). Liver Function Tests: Recent Labs  Lab 08/17/22 0530 08/22/22 0519  AST 16  --   ALT 15  --   ALKPHOS 52  --   BILITOT 0.7  --   PROT 5.2*  --   ALBUMIN 2.7* 2.3*   No results for input(s): "LIPASE", "AMYLASE" in the last 168 hours. No results for input(s): "AMMONIA" in the last 168 hours. Coagulation Profile: Recent Labs  Lab 08/17/22 1314  INR 1.2   Cardiac Enzymes: No results for input(s): "CKTOTAL", "CKMB", "CKMBINDEX", "TROPONINI" in the last 168 hours. BNP (last 3  results) No results for input(s): "PROBNP" in the last 8760 hours. HbA1C: No results for input(s): "HGBA1C" in the last 72 hours. CBG: No results for input(s): "GLUCAP" in the last 168 hours. Lipid Profile: No results for input(s): "CHOL", "HDL", "LDLCALC", "TRIG", "CHOLHDL", "LDLDIRECT" in the last 72 hours. Thyroid Function Tests: No results for input(s): "TSH", "T4TOTAL", "FREET4", "T3FREE", "THYROIDAB" in the last 72 hours. Anemia Panel: No results for input(s): "VITAMINB12", "FOLATE", "FERRITIN", "TIBC", "IRON", "RETICCTPCT" in the last 72 hours. Sepsis Labs: No results for input(s): "PROCALCITON", "LATICACIDVEN" in the last 168 hours.   Recent Results (from the past 240 hour(s))  Culture, blood (routine x 2)     Status: Abnormal   Collection Time: 08/14/22  3:57 AM   Specimen: BLOOD  Result Value Ref Range Status   Specimen Description   Final    BLOOD BLOOD  RIGHT ARM Performed at [facility], [address], [place], [place] [address]    Special Requests   Final    BOTTLES DRAWN AEROBIC AND ANAEROBIC Blood Culture adequate volume Performed at [facility], [address] [place], [place] [address]    Culture  Setup Time   Final    Organism ID to follow GRAM POSITIVE COCCI IN BOTH AEROBIC AND ANAEROBIC BOTTLES CRITICAL RESULT CALLED TO, READ BACK BY AND VERIFIED WITH: PHARMD CHILDS AT 1957 08/14/2022 GAA Performed at [facility], [address] [place], [place] [address]    Culture ENTEROCOCCUS FAECALIS (A)  Final   Report Status 08/17/2022 FINAL  Final   Organism ID, Bacteria ENTEROCOCCUS FAECALIS  Final      Susceptibility   Enterococcus faecalis - MIC*    AMPICILLIN <=2 SENSITIVE Sensitive     VANCOMYCIN 1 SENSITIVE Sensitive     GENTAMICIN SYNERGY SENSITIVE Sensitive     * ENTEROCOCCUS FAECALIS  Culture, blood (routine x 2)     Status: Abnormal   Collection Time: 08/14/22  3:57 AM   Specimen: BLOOD  Result Value Ref Range  Status   Specimen Description   Final    BLOOD BLOOD LEFT ARM Performed at [facility], [address], [place], [place] [address]    Special Requests   Final    BOTTLES DRAWN AEROBIC AND ANAEROBIC Blood Culture adequate volume Performed at [facility], [address] [place], [place] [address]    Culture  Setup Time   Final    GRAM POSITIVE COCCI AEROBIC BOTTLE ONLY CRITICAL RESULT CALLED TO, READ BACK BY AND VERIFIED WITH: PHARMD CHILDS AT [place] 08/14/2022 GAA GRAM STAIN REVIEWED-AGREE WITH RESULT Performed at [facility], [address] [place], [place] [address]    Culture (A)  Final    ENTEROCOCCUS FAECALIS SUSCEPTIBILITIES PERFORMED ON PREVIOUS CULTURE WITHIN THE LAST 5 DAYS. Performed at [facility], [place] [address], [place], [place] [address]    Report Status 08/17/2022 FINAL  Final  Resp Panel by RT-PCR (Flu A&B, Covid) Anterior Nasal Swab     Status: None   Collection Time: 08/14/22  3:57 AM   Specimen: Anterior Nasal Swab  Result Value Ref Range Status   SARS Coronavirus 2 by RT PCR NEGATIVE NEGATIVE Final    Comment: (NOTE) SARS-CoV-2 target nucleic acids are NOT DETECTED.  The SARS-CoV-2 RNA is generally detectable in upper respiratory specimens during the acute phase of infection. The lowest concentration of SARS-CoV-2 viral copies this assay can detect is 138 copies/mL. A negative result does not preclude SARS-Cov-2 infection and should not be used as the sole basis for treatment or other patient management decisions. A negative result may occur with  improper specimen collection/handling, submission of specimen other than nasopharyngeal swab, presence of viral mutation(s) within the areas targeted by this assay, and inadequate number of viral copies(<138 copies/mL). A negative result must be combined with clinical observations, patient history, and epidemiological information. The expected result is Negative.  Fact  Sheet for Patients:  EntrepreneurPulse.com.au  Fact Sheet for Healthcare Providers:  IncredibleEmployment.be  This test is no t yet approved or cleared by the [place] FDA and  has been authorized for detection and/or diagnosis of SARS-CoV-2 by FDA under an Emergency Use Authorization (EUA). This EUA will remain  in effect (meaning this test can be used) for the duration of the COVID-19 declaration under Section 564(b)(1) of the Act, 21 U.S.C.section  360bbb-3(b)(1), unless the authorization is terminated  or revoked sooner.       Influenza A by PCR NEGATIVE NEGATIVE Final   Influenza B by PCR NEGATIVE NEGATIVE Final    Comment: (NOTE) The Xpert Xpress SARS-CoV-2/FLU/RSV plus assay is intended as an aid in the diagnosis of influenza from Nasopharyngeal swab specimens and should not be used as a sole basis for treatment. Nasal washings and aspirates are unacceptable for Xpert Xpress SARS-CoV-2/FLU/RSV testing.  Fact Sheet for Patients: EntrepreneurPulse.com.au  Fact Sheet for Healthcare Providers: IncredibleEmployment.be  This test is not yet approved or cleared by the [place] FDA and has been authorized for detection and/or diagnosis of SARS-CoV-2 by FDA under an Emergency Use Authorization (EUA). This EUA will remain in effect (meaning this test can be used) for the duration of the COVID-19 declaration under Section 564(b)(1) of the Act, 21 U.S.C. section 360bbb-3(b)(1), unless the authorization is terminated or revoked.  Performed at [facility], [address] [place], [place] [address]   Blood Culture ID Panel (Reflexed)     Status: Abnormal   Collection Time: 08/14/22  3:57 AM  Result Value Ref Range Status   Enterococcus faecalis DETECTED (A) NOT DETECTED Final    Comment: CRITICAL RESULT CALLED TO, READ BACK BY AND VERIFIED WITH: PHARMD CHILDS AT 1957 08/14/2022 GAA     Enterococcus Faecium NOT DETECTED NOT DETECTED Final   Listeria monocytogenes NOT DETECTED NOT DETECTED Final   Staphylococcus species NOT DETECTED NOT DETECTED Final   Staphylococcus aureus (BCID) NOT DETECTED NOT DETECTED Final   Staphylococcus epidermidis NOT DETECTED NOT DETECTED Final   Staphylococcus lugdunensis NOT DETECTED NOT DETECTED Final   Streptococcus species NOT DETECTED NOT DETECTED Final   Streptococcus agalactiae NOT DETECTED NOT DETECTED Final   Streptococcus pneumoniae NOT DETECTED NOT DETECTED Final   Streptococcus pyogenes NOT DETECTED NOT DETECTED Final   A.calcoaceticus-baumannii NOT DETECTED NOT DETECTED Final   Bacteroides fragilis NOT DETECTED NOT DETECTED Final   Enterobacterales NOT DETECTED NOT DETECTED Final   Enterobacter cloacae complex NOT DETECTED NOT DETECTED Final   Escherichia coli NOT DETECTED NOT DETECTED Final   Klebsiella aerogenes NOT DETECTED NOT DETECTED Final   Klebsiella oxytoca NOT DETECTED NOT DETECTED Final   Klebsiella pneumoniae NOT DETECTED NOT DETECTED Final   Proteus species NOT DETECTED NOT DETECTED Final   Salmonella species NOT DETECTED NOT DETECTED Final   Serratia marcescens NOT DETECTED NOT DETECTED Final   Haemophilus influenzae NOT DETECTED NOT DETECTED Final   Neisseria meningitidis NOT DETECTED NOT DETECTED Final   Pseudomonas aeruginosa NOT DETECTED NOT DETECTED Final   Stenotrophomonas maltophilia NOT DETECTED NOT DETECTED Final   Candida albicans NOT DETECTED NOT DETECTED Final   Candida auris NOT DETECTED NOT DETECTED Final   Candida glabrata NOT DETECTED NOT DETECTED Final   Candida krusei NOT DETECTED NOT DETECTED Final   Candida parapsilosis NOT DETECTED NOT DETECTED Final   Candida tropicalis NOT DETECTED NOT DETECTED Final   Cryptococcus neoformans/gattii NOT DETECTED NOT DETECTED Final   Vancomycin resistance NOT DETECTED NOT DETECTED Final    Comment: Performed at [facility], [address],  [place], [place] [address]  Urine Culture     Status: Abnormal   Collection Time: 08/14/22  6:54 AM   Specimen: Urine, Clean Catch  Result Value Ref Range Status   Specimen Description   Final    URINE, CLEAN CATCH Performed at [facility], [address], [place], [place] [address]  Special Requests   Final    NONE Performed at [facility], [address] [place], [place] [address]    Culture >=100,000 COLONIES/mL ENTEROCOCCUS FAECALIS (A)  Final   Report Status 08/16/2022 FINAL  Final   Organism ID, Bacteria ENTEROCOCCUS FAECALIS (A)  Final      Susceptibility   Enterococcus faecalis - MIC*    AMPICILLIN <=2 SENSITIVE Sensitive     NITROFURANTOIN <=16 SENSITIVE Sensitive     VANCOMYCIN 1 SENSITIVE Sensitive     * >=100,000 COLONIES/mL ENTEROCOCCUS FAECALIS  Culture, blood (Routine X 2) w Reflex to ID Panel     Status: None   Collection Time: 08/15/22 12:22 PM   Specimen: Right Antecubital; Blood  Result Value Ref Range Status   Specimen Description RIGHT ANTECUBITAL  Final   Special Requests   Final    BOTTLES DRAWN AEROBIC AND ANAEROBIC Blood Culture adequate volume   Culture   Final    NO GROWTH 5 DAYS Performed at [facility], [address] [place], [place] [address]    Report Status 08/20/2022 FINAL  Final  Culture, blood (Routine X 2) w Reflex to ID Panel     Status: None   Collection Time: 08/15/22 12:22 PM   Specimen: BLOOD RIGHT ARM  Result Value Ref Range Status   Specimen Description BLOOD RIGHT ARM  Final   Special Requests   Final    BOTTLES DRAWN AEROBIC AND ANAEROBIC Blood Culture adequate volume   Culture   Final    NO GROWTH 5 DAYS Performed at [facility], [address] [place], [place] [address]    Report Status 08/20/2022 FINAL  Final         Radiology Studies: No results found.      Scheduled Meds:  amLODipine  10 mg Oral Daily   atorvastatin  40 mg Oral QHS   calcitRIOL  0.25 mcg Oral q  AM   clopidogrel  75 mg Oral Daily   donepezil  10 mg Oral QHS   ezetimibe  10 mg Oral QPM   ferrous sulfate  325 mg Oral Q2000   heparin injection (subcutaneous)  5,000 Units Subcutaneous Q8H   ipratropium  2-4 spray Each Nare QHS   lidocaine  1 patch Transdermal Q24H   loratadine  10 mg Oral Daily   melatonin  10 mg Oral QHS   methocarbamol  500 mg Oral TID   metoprolol succinate  25 mg Oral Daily   multivitamin with minerals  1 tablet Oral QPC lunch   oxyCODONE-acetaminophen  2 tablet Oral Q6H   pantoprazole  40 mg Oral QAC breakfast   polyethylene glycol  17 g Oral BID   predniSONE  40 mg Oral Q breakfast   senna-docusate  2 tablet Oral BID   zolpidem  10 mg Oral QHS   Continuous Infusions:  sodium chloride 75 mL/hr at 08/23/22 0420   ampicillin (OMNIPEN) IV 2 g (08/23/22 0849)    Assessment & Plan:   Principal Problem:   Complication of aortic graft Active Problems:   COPD exacerbation (HCC)   AAA (abdominal aortic aneurysm) without rupture (HCC)   AKI (acute kidney injury) ([place])   Thoracic aortic aneurysm without rupture ([place])   Aneurysm of right common iliac artery (HCC)   CAD S/P CABG x 3   Myocardial injury   Essential hypertension   Chronic diastolic CHF (congestive heart failure) (HCC)   Chronic kidney disease, stage IV (severe) ([place])  HLD (hyperlipidemia)   Iron deficiency anemia   TIA (transient ischemic attack)   Mild cognitive impairment   Low back pain   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 Lucky Cowboy. Now the size has enlarged from 6.9  to 7.6 cm 10/16 s/p repair on 10/9 by Dr. Lucky Cowboy      Severe back pain Lumbar OA, DDD Multiple lobar spine hardware 10/11 ct spine with degenerative disc dz, no acute issues 10/12 declined MRI of spine to be done at [facility].  Wife stated he is too weak to be being transferred and they have opted not to be transferred anywhere for any kind of imaging 10/14 Robaxin was changed from as needed to standing dose.  Still with pain.  Will coordinate MRI with contrast of spine at [facility] once we have info about his pacemaker compatibility.  10/16 MRI spine cannot be done due to pacemaker incompatability Neurosurgery consulted, ID updated      CAD S/P CABG x 3 Hx of CAD s/p of CABG and myocardial injury due to COPD exacerbation:  Myocardial injury less likely vs Troponin elevation d/t CKD4 Troponin level 31, 30.  Denies chest pain. Continue home Lipitor, Zetia hold ASA consulted Dr. Saralyn Pilar of cardiology per Dr. Dolores Hoose recommendation - optimized from cardiac standpoint, though risk is present. Troponins likely d/t CKD 10/16 TEE negative for vegetation       Low back pain This seems to be a chronic issue, has worsened recently. 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 There is osteopenia degenerative change of the lumbar spine with discogenic mild grade 1 retrolisthesis unchanged at L1-2 and L2-3, slight dextroscoliosis. pain control: As needed Percocet, Tylenol, Dilaudid --> scheduled po opiates w/ prn Dilaudid As needed Robaxin Lidoderm MRI L Spine per ID recs given (+)Bcx -patient has pacemaker, family would like to avoid transferring to different facility if possible.  10/13 Robxin change to standing dose. 10/14 hospice spoke to family they have opted to proceed with MRI 10/16 as above   SOB w/ mucus plugging of bronchi noted on CT PE resolved on CTA (recently diagnosed, see above)  Transition heparin to  DVT ppx dose now that no PE Given SOB, may consider pulmonary consult to review CT and eval if bronchoscopy warranted...Marland KitchenMarland Kitchen. 10/15 resolved , monitor for now    AKI on Chronic kidney disease, stage IV (severe) (HCC) Slightly worsening than baseline. Today 08/17/22 Cr 2.06 (vs 2.69 on admission) Nephrology following - see note for recs re: contrast administration  Planning further contrast studies to eval PE and lumbar spine - increased fluids for now to 100 mL/h and plan to reduce this pending renal function tomorrow.  10/15 Imporved some.  AKI most likely secondary to contrast-induced nephropathy.  Now around his baseline.  Will need to follow-up with nephrology as outpatient 10/6 DC IV fluids   Positive blood cultures - E Facealis Bacteremia ID to follow Ampicillin Tee no vegetation  Possible COPD exacerbation ([place]) - seems less likely given CT findings from today  10/10 see above Patient is a former smoker, smoked more than 20 years.  CT chest did not show focal infiltration, does not seem to have pneumonia.  Procalcitonin elevated at 0.39.   Patient does not meet criteria for sepsis. will admit to tele bed as inpatient Bronchodilators pt was given 80 mg of Solu-Medrol in ED --> will change to oral prednisone 40 mg daily Z pak  Mucinex for cough  Incentive spirometry Urine S. Pneumococcal and legionella antigen sputum culture Nasal cannula oxygen as needed to maintain O2 saturation 93% or greater if pt desat Consider pulmonary consult  10/16 stable.   Essential hypertension IV hydralazine as needed Metoprolol hold Cozaar since pt will receive contrast  10/13 add amlodipine. Increase amlodipine to 10mg  qd   Chronic diastolic CHF (congestive heart failure) ([place]) 2D echo on 06/14/2022 showed EF of 60 to 65% with grade 1 diastolic dysfunction.   CHF seem to be compensated.    HLD (hyperlipidemia) Continue to Lipitor and zetia   Iron deficiency anemia Hemoglobin 8.6 (9.7  on 06/15/2022, and 8.6 on 06/13/2022), no active bleeding. Follow-up with CBC Continue iron supplement   History of TIA (transient ischemic attack) Continue Lipitor Hold ASA and Plavix with pending vascular surgeon's evaluation of his AAA.   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  No acute issues.   Obesity (BMI 30-39.9) BMI= 30.68  and BW= 99.8 Diet and exercise.   Encourage to lose weight.   Anxiety start prn Xanax 0.25 mg bid     DVT prophylaxis: holding pharmacologic given bleed risk , scd Code Status:dnr Family Communication: wife Disposition Plan:  Status is: Inpatient Remains inpatient appropriate because: iv treatment,with back pain. W/u pending    LOS: 9 days   Time spent: 35 min    Nolberto Hanlon, MD [facility] Pager [phone]  If 7PM-7AM, please contact night-coverage 08/23/2022, 9:03 AM

## 2022-08-23 NOTE — Consult Note (Signed)
Referring Physician:  No referring provider defined for this encounter.  Primary Physician:  Idelle Crouch, MD  History of Present Illness: 08/23/2022 Rick Mcbride is admitted for bacteremia.  He recently suffered a fall in August and required right hip surgical fixation.  He was admitted to a nursing facility for rehab, then was doing physical therapy at home.  He presented to the hospital with worsening lower back pain and shortness of breath.     He and his wife report that he has had ongoing back pain for several years.  He initially did well after his back surgery in 2019, but had slowly worsening pain over the last several years.  He denies any overtly radicular symptoms at this time.  He has had substantially limited mobilization over the past several months, particularly since he broke his hip.  He has not walked to any degree since he broke his hip in August.   Review of Systems:  A 10 point review of systems is negative, except for the pertinent positives and negatives detailed in the HPI.  Past Medical History: Past Medical History:  Diagnosis Date   AAA (abdominal aortic aneurysm) ([place])    a.) s/p EVAR 01/05/2017. b.) native aneurysm sac 6.6 x 6.9 cm by CT on 03/31/2021   Abnormality of tongue    a.) CT head/neck 07/10/2021 --> asymmetric soft tissue at the RIGHT tongue base with a superficial 8 mm lesion.   Anemia    Aneurysm of right common iliac artery (HCC)    a.) measured 2.7 cm by CT on 03/31/2021   Anxiety    Aortic atherosclerosis (HCC)    Atrophic kidney    B12 deficiency    CAD (coronary artery disease)    Cervical radiculopathy    Chronic airway obstruction (HCC)    Chronic kidney disease (CKD), stage III (moderate) (HCC)    Chronic pain syndrome 06/16/2021   Chronic right shoulder pain 11/12/2019   Chronic tension headaches    Chronic, continuous use of opioids 06/16/2021   Coronary artery disease    DDD (degenerative disc disease),  lumbar    Degenerative disc disease, lumbar    with lumbar radiculopathy   Elbow fracture, left    GERD (gastroesophageal reflux disease)    H/O adenomatous polyp of colon    H/O hemorrhoids    HTN (hypertension)    Hyperlipidemia    Meralgia paresthetica    Mild cognitive impairment    Nephrolithiasis    Neuralgia    Numbness of right foot 02/19/2021   Osteoarthritis    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   Presence of permanent cardiac pacemaker    Prostate cancer [facility])    a.) s/p prostatectomy   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    Past Surgical History: Past Surgical History:  Procedure Laterality Date   AORTIC INTERVENTION N/A 08/16/2022   Procedure: AORTIC INTERVENTION Type II endoleak;  Surgeon: Algernon Huxley, MD;  Location: [place] [facility];  Service: Cardiovascular;  Laterality: N/A;   CATARACT EXTRACTION Bilateral    COLONOSCOPY     COLONOSCOPY     COLONOSCOPY WITH PROPOFOL N/A 10/20/2015   Procedure: COLONOSCOPY WITH PROPOFOL;  Surgeon: Manya Silvas, MD;  Location: [facility] END[facility]COPY;  Service: Endoscopy;  Laterality: N/A;   COLONOSCOPY WITH PROPOFOL N/A 11/28/2020   Procedure: COLONOSCOPY WITH PROPOFOL;  Surgeon: Robert Bellow, MD;  Location: [facility] ENDOSCOPY;  Service: Endoscopy;  Laterality: N/A;   CORONARY ARTERY BYPASS GRAFT N/A 05/01/2004   Procedure: 3v CABG (LIMA-LAD, SVG-OM1, SVG-PDA); Location: [facility]; Surgeon: Ander Gaster, MD   EMBOLIZATION Right 12/27/2016   Procedure: Embolization;   Surgeon: Algernon Huxley, MD;  Location: [place] [facility];  Service: Cardiovascular;  Laterality: Right;   ENDOVASCULAR REPAIR/STENT GRAFT N/A 01/05/2017   Procedure: Endovascular Repair/Stent Graft;  Surgeon: Algernon Huxley, MD;  Location: [place] [facility];  Service: Cardiovascular;  Laterality: N/A;   HIP ARTHROPLASTY Right 06/10/2022   Procedure: ARTHROPLASTY BIPOLAR HIP (HEMIARTHROPLASTY);  Surgeon: [name], MD;  Location: [facility];  Service: Orthopedics;  Laterality: Right;   INSERT / REPLACE / REMOVE PACEMAKER     JOINT REPLACEMENT     shoulder and knees   KNEE ARTHROSCOPY     LUNG LOBECTOMY Right 01/31/2013   Procedure: RIGHT PULMONARY LOBECTOMY; Location: [place]; Surgeon: Nestor Lewandowsky, MD   MICROLARYNGOSCOPY Right 08/10/2021   Procedure: MICRODIRECT LARYNGOSCOPY WITH BIOPSY OF TONGUE BASE;  Surgeon: Beverly Gust, MD;  Location: [facility];  Service: ENT;  Laterality: Right;   PACEMAKER INSERTION  12/2012   Dual chanber pacemaker generator   POLYPECTOMY     POSTERIOR LUMBAR FUSION     Procedure: POSTERIOR LUMBAR INTERBODY FUSION, INTERBODY PROSTHESIS, POSTERIOR LATERAL ARTHRODESIS, POSTERIOR NON-SEGMENTAL INSTRUMENTATION LUMBAR FIVE- SACRAL ONE; Location[facility]; Surgeon: Newman Pies, MD   PROSTATECTOMY     TEE WITHOUT CARDIOVERSION N/A 08/19/2022   Procedure: TRANSESOPHAGEAL ECHOCARDIOGRAM (TEE);  Surgeon: Corey Skains, MD;  Location: [facility];  Service: Cardiovascular;  Laterality: N/A;   TONGUE BIOPSY N/A 08/10/2021   Procedure: TONGUE BIOPSY;  Surgeon: Beverly Gust, MD;  Location: [facility];  Service: ENT;  Laterality: N/A;   TOTAL KNEE ARTHROPLASTY Bilateral    TOTAL SHOULDER ARTHROPLASTY Left 07/10/2015   Procedure: TOTAL SHOULDER ARTHROPLASTY;  Surgeon: [name], MD;  Location: [facility];  Service: Orthopedics;  Laterality: Left;   TOTAL SHOULDER REPLACEMENT      Allergies: Allergies as of 08/14/2022   (No Known Allergies)     Medications: Current Meds  Medication Sig   acetaminophen (TYLENOL) 500 MG tablet Take 500 mg by mouth every 4 (four) hours as needed for moderate pain.   aspirin EC 81 MG tablet Take 81 mg by mouth daily.    atorvastatin (LIPITOR) 20 MG tablet Take 40 mg by mouth at bedtime.   calcitRIOL (ROCALTROL) 0.25 MCG capsule Take 0.25 mcg by mouth in the morning.   Calcium Carb-Cholecalciferol (CALCIUM 600 + D PO) Take 1 tablet by mouth daily after lunch.   cetirizine (ZYRTEC) 10 MG tablet Take 10 mg by mouth at bedtime.   clopidogrel (PLAVIX) 75 MG tablet Take 1 tablet (75 mg total) by mouth daily. Restart Plavix after completion of Eliquis prescription.   docusate sodium (COLACE) 100 MG capsule Take 300 mg by mouth daily after lunch.   donepezil (ARICEPT) 10 MG tablet Take 10 mg by mouth at  bedtime.   ezetimibe (ZETIA) 10 MG tablet Take 10 mg by mouth every evening.   Ferrous Sulfate (IRON SLOW RELEASE) 140 (45 Fe) MG TBCR Take 1 tablet by mouth 2 (two) times daily.   ipratropium (ATROVENT) 0.06 % nasal spray Place 2-4 sprays into both nostrils at bedtime.   loratadine (CLARITIN) 10 MG tablet Take 10 mg by mouth in the morning.   losartan (COZAAR) 100 MG tablet Take 100 mg by mouth in the morning.   melatonin 5 MG TABS Take 10 mg by mouth at bedtime.   methocarbamol (ROBAXIN) 500 MG tablet Take 1 tablet (500 mg total) by mouth at bedtime as needed for muscle spasms. (Patient taking differently: Take 500 mg by mouth every evening.)   metoprolol succinate (TOPROL-XL) 25 MG 24 hr tablet Take 25 mg by mouth daily.   Multiple Vitamin (MULTIVITAMIN WITH MINERALS) TABS tablet Take 1 tablet by mouth daily after lunch.   Multiple Vitamins-Minerals (PRESERVISION AREDS 2) CAPS Take 1 tablet by mouth 2 (two) times daily.   pantoprazole (PROTONIX) 40 MG tablet Take 40 mg by mouth every evening.   Probiotic Product (PROBIOTIC PO) Take 1 capsule by mouth in the morning.   traMADol (ULTRAM) 50 MG tablet Take  1 tablet (50 mg total) by mouth every 6 (six) hours as needed for moderate pain. (Patient taking differently: Take 50 mg by mouth 4 (four) times daily.)   zolpidem (AMBIEN) 10 MG tablet Take 1 tablet (10 mg total) by mouth at bedtime.    Social History: Social History   Tobacco Use   Smoking status: Former    Packs/day: 1.50    Years: 45.00    Total pack years: 67.50    Types: Cigarettes    Quit date: 04/07/2004    Years since quitting: 18.3   Smokeless tobacco: Never  Vaping Use   Vaping Use: Never used  Substance Use Topics   Alcohol use: No   Drug use: No    Family Medical History: Family History  Problem Relation Age of Onset   Heart attack Mother    Heart attack Father    Breast cancer Sister    Asthma Sister     Physical Examination: Vitals:   08/23/22 1217 08/23/22 1511  BP: (!) 141/70 128/72  Pulse: 84 85  Resp: 17 18  Temp:    SpO2: 98% 96%    General: Patient is well developed, well nourished, calm, collected, and in no apparent distress. Attention to examination is appropriate.  Neck:   Moves freely.  Respiratory: Patient has tachypnea and is slightly short of breath.   NEUROLOGICAL:     Awake, alert, oriented to person, place, and time.  Speech is clear and fluent. Fund of knowledge is appropriate.   Cranial Nerves: Pupils equal round and reactive to light.  Facial tone is symmetric.  Facial sensation is symmetric. Shoulder shrug is symmetric. Tongue protrusion is midline.     Strength: Side Biceps Triceps Deltoid Interossei Grip Wrist Ext. Wrist Flex.  R 5 5 5 5 5 5 5   L 5 5 5 5 5 5 5    Side Iliopsoas Quads Hamstring PF DF EHL  R 4 5 5 5 5 5   L 5 5 5 5 5 5    Reflexes are 1+ and symmetric at the biceps, triceps, brachioradialis, patella and achilles.   Hoffman's is absent.   Bilateral upper and lower extremity sensation is symmetric to light touch.   He has diminished BLE sensation  below the knees. No evidence of dysmetria noted.  Gait  is untested.     Medical Decision Making  Imaging: CT L spine 08/17/2022 IMPRESSION: 1. Status post L5-S1 posterior fusion, with evidence of osseous fusion across the disc space. 2. 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 3. Multilevel degenerative disc disease, worst at L4-L5 and L5-S1, with mild bilateral neural foraminal narrowing at L4-L5 and moderate bilateral neural foraminal narrowing at L5-S1. 4. No spinal canal stenosis. 5. No acute fracture or traumatic listhesis.     Electronically Signed   By: Merilyn Baba M.D.   On: 08/17/2022 18:25  I have personally reviewed the images and agree with the above interpretation.  Assessment and Plan: Mr. Strawser is a pleasant 81 y.o. male with substantial lumbar spondylosis and back pain.  His back pain has been slowly worsening over time.  I suspect 1 issue with his back pain is his lack of mobility.  He has been doing physical therapy, but has had slowly worsening physical function over the last several months.  He is currently not a candidate for injection therapy, as he is being treated for a blood infection.  He will need to complete treatment for that before consideration of injections.  1 could consider fitting him with a brace.  There is not great data to support that, but he has limited options at this time.  His other options are continue medical therapy with muscle relaxants and low level pain medication.  He is not a candidate for NSAIDs due to his severe kidney disease.  When he is ready for discharge, I recommend referral to the physiatry service at [facility].  He has previously seen Dr. Sharlet Salina.  He may benefit from an MRI scan to help elucidate whether he is suffering from discitis on top of his bloodstream infection, but that may not be safe due to his current pacemaker.  I agree with consultation of the palliative care service.  Unfortunately, it seems like he is slowly  worsening over time.  I suspect this will continue.  Thank you for involving me in the care of this patient.      Mylinda Brook K. Izora Ribas MD, [facility] Neurosurgery

## 2022-08-24 DIAGNOSIS — N179 Acute kidney failure, unspecified: Secondary | ICD-10-CM | POA: Diagnosis not present

## 2022-08-24 DIAGNOSIS — T829XXA Unspecified complication of cardiac and vascular prosthetic device, implant and graft, initial encounter: Secondary | ICD-10-CM | POA: Diagnosis not present

## 2022-08-24 DIAGNOSIS — I714 Abdominal aortic aneurysm, without rupture, unspecified: Secondary | ICD-10-CM | POA: Diagnosis not present

## 2022-08-24 DIAGNOSIS — I723 Aneurysm of iliac artery: Secondary | ICD-10-CM | POA: Diagnosis not present

## 2022-08-24 LAB — BASIC METABOLIC PANEL
Anion gap: 7 (ref 5–15)
BUN: 35 mg/dL — ABNORMAL HIGH (ref 8–23)
CO2: 23 mmol/L (ref 22–32)
Calcium: 8.6 mg/dL — ABNORMAL LOW (ref 8.9–10.3)
Chloride: 108 mmol/L (ref 98–111)
Creatinine, Ser: 1.88 mg/dL — ABNORMAL HIGH (ref 0.61–1.24)
GFR, Estimated: 35 mL/min — ABNORMAL LOW (ref >60)
Glucose, Bld: 101 mg/dL — ABNORMAL HIGH (ref 70–99)
Potassium: 3.7 mmol/L (ref 3.5–5.1)
Sodium: 138 mmol/L (ref 135–145)

## 2022-08-24 LAB — CBC
HCT: 23.8 % — ABNORMAL LOW (ref 39.0–52.0)
Hemoglobin: 7.2 g/dL — ABNORMAL LOW (ref 13.0–17.0)
MCH: 27 pg (ref 26.0–34.0)
MCHC: 30.3 g/dL (ref 30.0–36.0)
MCV: 89.1 fL (ref 80.0–100.0)
Platelets: 217 10*3/uL (ref 150–400)
RBC: 2.67 MIL/uL — ABNORMAL LOW (ref 4.22–5.81)
RDW: 16.2 % — ABNORMAL HIGH (ref 11.5–15.5)
WBC: 8.7 10*3/uL (ref 4.0–10.5)
nRBC: 0 % (ref 0.0–0.2)

## 2022-08-24 MED ORDER — GUAIFENESIN ER 600 MG PO TB12
600.0000 mg | ORAL_TABLET | Freq: Two times a day (BID) | ORAL | Status: AC
  Administered 2022-08-24 – 2022-08-30 (×14): 600 mg via ORAL
  Filled 2022-08-24 (×14): qty 1

## 2022-08-24 NOTE — Consult Note (Addendum)
PULMONOLOGY         Date: 08/24/2022,   MRN# [id number] Rick Mcbride. 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  Was hospitalized in August due to right femoral neck fracture.  He has completed rehab.  On admission his blood work showed likely progressed CKD compared to baseline and an enlarged AAA.  He had vascular surgery evaluation with possible surgery and.  He also had urine culture with findings of over 100,000 colonies of Enterococcus faecalis.  He had blood cultures performed and this was also positive for Enterococcus faecalis.  I reviewed his CT chest with findings of no pulmonary embolism, and previous left upper lobe pulmonary artery embolus is no longer seen on this study. He does have bilateral pleural effusions to mild severity which may be contributing to his dyspnea with associated atelectasis.  PAST MEDICAL HISTORY   Past Medical History:  Diagnosis Date   AAA (abdominal aortic aneurysm) ([place])    a.) s/p EVAR 01/05/2017. b.) native aneurysm sac 6.6 x 6.9 cm by CT on 03/31/2021   Abnormality of tongue    a.) CT head/neck 07/10/2021 --> asymmetric soft tissue at the RIGHT tongue base with a superficial 8 mm lesion.   Anemia    Aneurysm of right common iliac artery (HCC)     a.) measured 2.7 cm by CT on 03/31/2021   Anxiety    Aortic atherosclerosis (HCC)    Atrophic kidney    B12 deficiency    CAD (coronary artery disease)    Cervical radiculopathy    Chronic airway obstruction (HCC)    Chronic kidney disease (CKD), stage III (moderate) (HCC)    Chronic pain syndrome 06/16/2021   Chronic right shoulder pain 11/12/2019   Chronic tension headaches    Chronic, continuous use of opioids 06/16/2021   Coronary artery disease    DDD (degenerative disc disease), lumbar    Degenerative disc disease, lumbar    with lumbar radiculopathy   Elbow fracture, left    GERD (gastroesophageal reflux disease)    H/O adenomatous polyp of colon    H/O hemorrhoids    HTN (hypertension)    Hyperlipidemia    Meralgia paresthetica    Mild cognitive impairment    Nephrolithiasis    Neuralgia    Numbness of right foot 02/19/2021   Osteoarthritis    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   Presence of permanent cardiac pacemaker    Prostate cancer [facility])    a.) s/p prostatectomy   S/P CABG x 3 05/01/2004   a.) LVEF 40-49%; LIMA-LAD, SVG-OM1, SVG-PDA   Second degree AV block    Sinoatrial node dysfunction ([place])    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[place]) 01/31/2013   a.)  RLL squamous cell carcinoma   Status post partial lobectomy of lung    a.) s/p RLL resection on 01/19/2013   Stroke [facility])    TIA (transient ischemic attack)    Valvular regurgitation    a.) TTE 10/24/2019 --> LVEF 45-50%; trivial TR, mild AR and MR; moderate LA dilitation.     SURGICAL HISTORY   Past Surgical History:  Procedure Laterality Date   AORTIC INTERVENTION N/A 08/16/2022   Procedure: AORTIC INTERVENTION Type II endoleak;  Surgeon: Algernon Huxley, MD;  Location: [place] CV LAB;  Service: Cardiovascular;  Laterality: N/A;   CATARACT EXTRACTION Bilateral    COLONOSCOPY     COLONOSCOPY     COLONOSCOPY WITH PROPOFOL N/A 10/20/2015   Procedure: COLONOSCOPY WITH  PROPOFOL;  Surgeon: Manya Silvas, MD;  Location: [facility] ENDOSCOPY;  Service: Endoscopy;  Laterality: N/A;   COLONOSCOPY WITH PROPOFOL N/A 11/28/2020   Procedure: COLONOSCOPY WITH PROPOFOL;  Surgeon: Robert Bellow, MD;  Location: [facility] ENDOSCOPY;  Service: Endoscopy;  Laterality: N/A;   CORONARY ARTERY BYPASS GRAFT N/A 05/01/2004   Procedure: 3v CABG (LIMA-LAD, SVG-OM1, SVG-PDA); Location: [facility]; Surgeon: Ander Gaster, MD   EMBOLIZATION Right 12/27/2016   Procedure: Embolization;  Surgeon: Algernon Huxley, MD;  Location: [place] CV LAB;  Service: Cardiovascular;  Laterality: Right;   ENDOVASCULAR REPAIR/STENT GRAFT N/A 01/05/2017   Procedure: Endovascular Repair/Stent Graft;  Surgeon: Algernon Huxley, MD;  Location: [place] CV LAB;  Service: Cardiovascular;  Laterality: N/A;   HIP ARTHROPLASTY Right 06/10/2022   Procedure: ARTHROPLASTY BIPOLAR HIP (HEMIARTHROPLASTY);  Surgeon: [name], MD;  Location: [facility] ORS;  Service: Orthopedics;  Laterality: Right;   INSERT / REPLACE / REMOVE PACEMAKER     JOINT REPLACEMENT     shoulder and knees   KNEE ARTHROSCOPY     LUNG LOBECTOMY Right 01/31/2013   Procedure: RIGHT PULMONARY LOBECTOMY; Location: [place]; Surgeon: Nestor Lewandowsky, MD   MICROLARYNGOSCOPY Right 08/10/2021   Procedure: MICRODIRECT LARYNGOSCOPY WITH BIOPSY OF TONGUE BASE;  Surgeon: Beverly Gust, MD;  Location: [facility] ORS;  Service: ENT;  Laterality: Right;   PACEMAKER INSERTION  12/2012   Dual chanber pacemaker generator   POLYPECTOMY     POSTERIOR LUMBAR FUSION     Procedure: POSTERIOR LUMBAR INTERBODY FUSION, INTERBODY PROSTHESIS, POSTERIOR LATERAL ARTHRODESIS, POSTERIOR NON-SEGMENTAL INSTRUMENTATION LUMBAR FIVE- SACRAL ONE; Location: [facility]; Surgeon: Newman Pies, MD   PROSTATECTOMY     TEE WITHOUT CARDIOVERSION N/A 08/19/2022   Procedure: TRANSESOPHAGEAL ECHOCARDIOGRAM (TEE);  Surgeon: Corey Skains, MD;  Location: [facility] ORS;  Service: Cardiovascular;   Laterality: N/A;   TONGUE BIOPSY N/A 08/10/2021   Procedure: TONGUE BIOPSY;  Surgeon: Beverly Gust, MD;  Location: [facility] ORS;  Service: ENT;  Laterality: N/A;   TOTAL KNEE ARTHROPLASTY Bilateral    TOTAL SHOULDER ARTHROPLASTY Left 07/10/2015   Procedure: TOTAL SHOULDER ARTHROPLASTY;  Surgeon: [name], MD;  Location: [facility] ORS;  Service: Orthopedics;  Laterality: Left;   TOTAL SHOULDER REPLACEMENT       FAMILY HISTORY   Family History  Problem Relation Age of Onset   Heart attack Mother    Heart attack Father    Breast cancer Sister    Asthma Sister      SOCIAL HISTORY   Social History   Tobacco Use   Smoking status: Former    Packs/day: 1.50    Years: 45.00    Total pack years: 67.50    Types: Cigarettes    Quit  date: 04/07/2004    Years since quitting: 18.3   Smokeless tobacco: Never  Vaping Use   Vaping Use: Never used  Substance Use Topics   Alcohol use: No   Drug use: No     MEDICATIONS    Home Medication:    Current Medication:  Current Facility-Administered Medications:    acetaminophen (TYLENOL) tablet 1,000 mg, 1,000 mg, Oral, Q6H PRN, Sharion Settler, NP, 1,000 mg at 08/21/22 0148   albuterol (PROVENTIL) (2.5 MG/3ML) 0.083% nebulizer solution 2.5 mg, 2.5 mg, Nebulization, Q4H PRN, Lucky Cowboy, Erskine Squibb, MD   amLODipine (NORVASC) tablet 10 mg, 10 mg, Oral, Daily, Amery, Sahar, MD, 10 mg at 08/24/22 0850   ampicillin (OMNIPEN) 2 g in sodium chloride 0.9 % 100 mL IVPB, 2 g, Intravenous, Q6H, Coulter, Carolyn, RPH, Last Rate: 300 mL/hr at 08/24/22 0856, 2 g at 08/24/22 0856   atorvastatin (LIPITOR) tablet 40 mg, 40 mg, Oral, QHS, Dew, Erskine Squibb, MD, 40 mg at 08/23/22 2112   calcitRIOL (ROCALTROL) capsule 0.25 mcg, 0.25 mcg, Oral, q AM, Lucky Cowboy, Erskine Squibb, MD, 0.25 mcg at 08/24/22 0846   chlorproMAZINE (THORAZINE) tablet 25 mg, 25 mg, Oral, TID PRN, Nolberto Hanlon, MD, 25 mg at 08/23/22 2124   clopidogrel (PLAVIX) tablet 75 mg, 75 mg, Oral, Daily, Dew, Erskine Squibb, MD, 75  mg at 08/24/22 0850   dextromethorphan-guaiFENesin ([place] DM) 30-600 MG per 12 hr tablet 1 tablet, 1 tablet, Oral, BID PRN, Algernon Huxley, MD, 1 tablet at 08/23/22 2122   donepezil (ARICEPT) tablet 10 mg, 10 mg, Oral, QHS, Dew, Erskine Squibb, MD, 10 mg at 08/23/22 2118   ezetimibe (ZETIA) tablet 10 mg, 10 mg, Oral, QPM, Dew, Erskine Squibb, MD, 10 mg at 08/23/22 2111   ferrous sulfate tablet 325 mg, 325 mg, Oral, Q2000, Algernon Huxley, MD, 325 mg at 08/23/22 2112   guaiFENesin (MUCINEX) 12 hr tablet 600 mg, 600 mg, Oral, BID, Amery, Sahar, MD, 600 mg at 08/24/22 1336   heparin injection 5,000 Units, 5,000 Units, Subcutaneous, Q8H, Emeterio Reeve, DO, 5,000 Units at 08/24/22 1336   hydrALAZINE (APRESOLINE) injection 5 mg, 5 mg, Intravenous, Q2H PRN, Algernon Huxley, MD, 5 mg at 08/21/22 0998   HYDROmorphone (DILAUDID) injection 1 mg, 1 mg, Intravenous, Q2H PRN, Emeterio Reeve, DO, 1 mg at 08/21/22 0925   ipratropium (ATROVENT) 0.06 % nasal spray 2-4 spray, 2-4 spray, Each Nare, QHS, Dew, Erskine Squibb, MD, 2 spray at 08/22/22 2214   lidocaine (LIDODERM) 5 % 1 patch, 1 patch, Transdermal, Q24H, Dew, Erskine Squibb, MD, 1 patch at 08/24/22 0846   loratadine (CLARITIN) tablet 10 mg, 10 mg, Oral, Daily, Dew, Erskine Squibb, MD, 10 mg at 08/23/22 2112   melatonin tablet 10 mg, 10 mg, Oral, QHS, Dew, Erskine Squibb, MD, 10 mg at 08/23/22 2112   methocarbamol (ROBAXIN) tablet 500 mg, 500 mg, Oral, TID, Nolberto Hanlon, MD, 500 mg at 08/24/22 0850   metoprolol succinate (TOPROL-XL) 24 hr tablet 25 mg, 25 mg, Oral, Daily, Dew, Erskine Squibb, MD, 25 mg at 08/24/22 0850   multivitamin with minerals tablet 1 tablet, 1 tablet, Oral, QPC lunch, Algernon Huxley, MD, 1 tablet at 08/24/22 1336   ondansetron (ZOFRAN) injection 4 mg, 4 mg, Intravenous, Q8H PRN, Dew, Erskine Squibb, MD   oxyCODONE-acetaminophen (PERCOCET/ROXICET) 5-325 MG per tablet 2 tablet, 2 tablet, Oral, Q6H, Emeterio Reeve, DO, 2 tablet at 08/24/22 1336   pantoprazole (PROTONIX) EC tablet 40 mg,  40 mg, Oral, QAC breakfast, [name]  S, MD, 40 mg at 08/24/22 0850   polyethylene glycol (MIRALAX / GLYCOLAX) packet 17 g, 17 g, Oral, BID, Emeterio Reeve, DO, 17 g at 08/24/22 1884   senna-docusate (Senokot-S) tablet 2 tablet, 2 tablet, Oral, BID, Emeterio Reeve, DO, 2 tablet at 08/24/22 0850   zolpidem (AMBIEN) tablet 10 mg, 10 mg, Oral, QHS, Dew, Erskine Squibb, MD, 10 mg at 08/23/22 2111    ALLERGIES   Patient has no known allergies.     REVIEW OF SYSTEMS    Review of Systems:  Gen:  Denies  fever, sweats, chills weigh loss  HEENT: Denies blurred vision, double vision, ear pain, eye pain, hearing loss, nose bleeds, sore throat Cardiac:  No dizziness, chest pain or heaviness, chest tightness,edema Resp:   reports dyspnea chronically  Gi: Denies swallowing difficulty, stomach pain, nausea or vomiting, diarrhea, constipation, bowel incontinence Gu:  Denies bladder incontinence, burning urine Ext:   Denies Joint pain, stiffness or swelling Skin: Denies  skin rash, easy bruising or bleeding or hives Endoc:  Denies polyuria, polydipsia , polyphagia or weight change Psych:   Denies depression, insomnia or hallucinations   Other:  All other systems negative   VS: BP 135/70 (BP Location: Right Arm)   Pulse 82   Temp 98.3 F (36.8 C) (Oral)   Resp 19   Ht 5\' 11"  (1.803 m)   Wt 89.9 kg   SpO2 96%   BMI 27.64 kg/m      PHYSICAL EXAM    GENERAL:NAD, no fevers, chills, no weakness no fatigue HEAD: Normocephalic, atraumatic.  EYES: Pupils equal, round, reactive to light. Extraocular muscles intact. No scleral icterus.  MOUTH: Moist mucosal membrane. Dentition intact. No abscess noted.  EAR, NOSE, THROAT: Clear without exudates. No external lesions.  NECK: Supple. No thyromegaly. No nodules. No JVD.  PULMONARY: decreased breath sounds with mild rhonchi worse at bases bilaterally.  CARDIOVASCULAR: S1 and S2. Regular rate and rhythm. No murmurs, rubs, or gallops. No  edema. Pedal pulses 2+ bilaterally.  GASTROINTESTINAL: Soft, nontender, nondistended. No masses. Positive bowel sounds. No hepatosplenomegaly.  MUSCULOSKELETAL: No swelling, clubbing, or edema. Range of motion full in all extremities.  NEUROLOGIC: Cranial nerves II through XII are intact. No gross focal neurological deficits. Sensation intact. Reflexes intact.  SKIN: No ulceration, lesions, rashes, or cyanosis. Skin warm and dry. Turgor intact.  PSYCHIATRIC: Mood, affect within normal limits. The patient is awake, alert and oriented x 3. Insight, judgment intact.       IMAGING     ASSESSMENT/PLAN   Dyspnea on exertion     - patient has severe and numerous comorbid conditions contributing to dyspnea. #1-pleural effusions bilaterally with associated atelectatic changes.  Ideally would like to diuresis however this is limited due to CKD. #2-centrilobular emphysema with COPD-appears to be chronic stable would recommend generic COPD care path with DuoNebs every 6 hours as needed as well as bronchopulmonary hygiene with incentive spirometry.  Consider MetaNeb with albuterol for recruitment. #3-CAD s/p PCI with chronic angina - cardio on case appreciate input - s/p TEE with reassuring fnidings.  Grade 1 - HFPEF-  #4- Lung cancer s/p lobectomy or RUL  #5CKD - ongoing fluid shift with interstitial edema and pleural effusion- may benefit from fluid restriction renal diet #6 sepsis -present on admission - with E.fecalis bacteremia and UTI - generally causes additional fatigue and dyspnea while acutely ill - treat with appropriate abx, sensitivity panel noted  #7 - Enlarging AAA with mass effect and back pain -  vascular surgery on case appreciate input    Ottie Glazier, M.D.  Pulmonary & [place]              Thank you for allowing me to participate in the care of this patient.   Patient/Family are satisfied with care plan and all questions have  been answered.    Provider disclosure: Patient with at least one acute or chronic illness or injury that poses a threat to life or bodily function and is being managed actively during this encounter.  All of the below services have been performed independently by signing provider:  review of prior documentation from internal and or external health records.  Review of previous and current lab results.  Interview and comprehensive assessment during patient visit today. Review of current and previous chest radiographs/CT scans. Discussion of management and test interpretation with health care team and patient/family.   This document was prepared using Dragon voice recognition software and may include unintentional dictation errors.     Ottie Glazier, M.D.  Division of Pulmonary & Critical Care Medicine

## 2022-08-24 NOTE — Progress Notes (Signed)
Physical Therapy Treatment Patient Details Name: Rick Mcbride. MRN: [id number] DOB: 1941-09-21 Today's Date: 08/24/2022   History of Present Illness Rick Mcbride [place]. is a 81 y.o. 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     Patient was recently hospitalized from 8/2 - 8/8 due to right femoral neck fracture.  Patient had surgery and finished the rehab.  Currently he is doing PT/OT at home.  He states that in the past several days, his lower back pain has worsened, difficult walking and doing physical therapy.  The lower back pain is constant, 7 out of 10 in severity, aching, nonradiating.  No loss control of bladder or bowel movement.  Denies new injury.  Patient also reports shortness of breath and dry cough.  No chest pain, fever or chills.  Denies nausea, vomiting, diarrhea or abdominal pain. Pt still has some mild right hip pain.  No symptoms of UTI. Pt had negative PE by V/Q scan which is ordered by his PCP yesterday.    PT Comments    Pt received in supine 45 minutes after receiving pain meds. Wife at bedside, pt agreed to attempt mobility. Pt required MaxA with HOB raised and use of side rail to gradually transition to sitting EOB. Pt educated on log rolling technique and reducing twisting at lower back to help with pain management. Pt tolerated 4 minutes sitting edge of bed requiring MaxA to return to supine and reposition to comfort. Pt remains motivated to improve functionally, however is significantly limited due to pain and muscle  atrophy throughout. Will continue to progress as tolerated.   Recommendations for follow up therapy are one component of a multi-disciplinary discharge planning process, led by the attending physician.  Recommendations may be updated based on patient status, additional functional criteria and insurance authorization.  Follow Up Recommendations  Skilled nursing-short term rehab (<3 hours/day) Can patient physically be transported by private vehicle: No   Assistance Recommended at Discharge Frequent or constant Supervision/Assistance  Patient can return home with the following Two people to help with walking and/or transfers;Two people to help with bathing/dressing/bathroom;Direct supervision/assist for medications management;Assistance with feeding;Assistance with cooking/housework;A lot of help with walking and/or transfers;A lot of help with bathing/dressing/bathroom;Direct supervision/assist for financial management;Assist for transportation;Help with stairs or ramp for entrance   Equipment Recommendations  None recommended by PT    Recommendations for Other Services       Precautions / Restrictions Precautions Precautions: Fall;Back Precaution Booklet Issued: No Restrictions Weight Bearing Restrictions: No     Mobility  Bed Mobility Overal bed mobility: Needs Assistance Bed Mobility: Rolling, Supine to Sit, Sit to Supine Rolling: Max assist   Supine to sit: Max assist, HOB elevated Sit to supine: Max assist   General bed mobility comments: Pt with increased reliance on bed mechanisms and use of side rail to assist with bed mobility    Transfers                   General transfer comment: Pt unable to stand at this time due to weakness and LBP  Ambulation/Gait               General Gait Details:  (non-ambulatory)   Stairs             Wheelchair Mobility    Modified Rankin (Stroke Patients Only)       Balance Overall balance assessment:  Needs assistance Sitting-balance support: Bilateral upper extremity supported, Feet supported Sitting balance-Leahy Scale: Fair Sitting balance - Comments: Pt tolerated static sitting at EOB x 4 minutes                                    Cognition Arousal/Alertness: Lethargic, Suspect due to medications Behavior During Therapy: WFL for tasks assessed/performed Overall Cognitive Status: History of cognitive impairments - at baseline                                 General Comments: pt with "mild cognitive impairment" at baseline, pt A&Ox3 (able to state name/DOB, location, grossly oriented to situation). Pt very lethargic during eval, required VC for alertness throughout        Exercises      General Comments        Pertinent Vitals/Pain Pain Assessment Pain Assessment: 0-10 Pain Score: 10-Worst pain ever Pain Location: low back Pain Descriptors / Indicators: Aching, Discomfort, Grimacing, Sharp Pain Intervention(s): Limited activity within patient's tolerance, Monitored during session, Premedicated before session    Home Living                          Prior Function            PT Goals (current goals can now be found in the care plan section) Acute Rehab PT Goals Patient Stated Goal: Feel better    Frequency    Min 2X/week      PT Plan      Co-evaluation              AM-PAC PT "6 Clicks" Mobility   Outcome Measure  Help needed turning from your back to your side while in a flat bed without using bedrails?: A Lot Help needed moving from lying on your back to sitting on the side of a flat bed without using bedrails?: A Lot Help needed moving to and from a bed to a chair (including a wheelchair)?: A Lot Help needed standing up from a chair using your arms (e.g., wheelchair or bedside chair)?: A Lot Help needed to walk in hospital room?: A Lot Help needed climbing 3-5 steps with a railing? : A Lot 6 Click Score:  12    End of Session   Activity Tolerance: Patient limited by fatigue;Patient limited by lethargy;Patient limited by pain Patient left: in bed;with call bell/phone within reach;with bed alarm set;with SCD's reapplied Nurse Communication: Mobility status PT Visit Diagnosis: Unsteadiness on feet (R26.81);Other abnormalities of gait and mobility (R26.89);Muscle weakness (generalized) (M62.81);Difficulty in walking, not elsewhere classified (R26.2);Pain Pain - part of body:  (lower back)     Time: 6073-7106 PT Time Calculation (min) (ACUTE ONLY): 27 min  Charges:  $Therapeutic Activity: 23-37 mins                    Mikel Cella, PTA    Josie Dixon 08/24/2022, 11:35 AM

## 2022-08-24 NOTE — Progress Notes (Signed)
PROGRESS NOTE    Rick Mcbride.  MVH:[id number] DOB: 07-Feb-1941 DOA: 08/14/2022 PCP: Idelle Crouch, MD    Brief Narrative:  Rick T Jayshon Dommer. is a 81 y.o. 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 Patient had surgery and finished SNF rehab. Currently he is doing PT/OT at home), chronic lower back pain (L5-S1 fusion), who presents with lower back pain and shortness breath. Pt had negative PE by V/Q scan which is ordered by his PCP day PTA, which was done for persistent SOB postoperatively. 10/07: WBC 3.8, negative COVID PCR, lactic acid 0.9, troponin level 31, 30, slightly worsening renal function with creatinine 2.69, BUN 31, GFR 23 (recent baseline creatinine 2.39 06/15/2022), temperature normal, blood pressure 172/84, 129/64, RR 83, heart rate 83, RR 17, oxygen saturation 98% on room air.  Chest x-ray showed possible mild patchy infiltration in left lower lobe.  CT head negative.  CT scan showed enlargement of AAA from previous 6.9 to 7.6 cm now.  Patient is admitted to telemetry bed as inpatient. Dr. Lorenso Courier of [facility] was consulted re: AAA, needs CTA, nephrology consulted and recommended IV fluids prior to CTA of Abd/Pelvis. Cardiology also consulted for surgical optimization/risk stratification - medically optimized at this time, troponin mild elevation likely d/t CKD4. BCx (+) E Faecalis, ID recommended r/o endocarditis/pacer infection w/ TEE and vascular surgery to assess re: graft involvement, changed Abx to ampicillin and repeat Bcx, also recommend MRI L-spine or CT w/ contrast, pt has pacemaker   10/08:  Pt and wife agreeable to TEE and MRI, though MRI would have to be at [facility]. Per vascular: "Noted bacteremia- however, patient with chronic progressive lower back pain; expanding aortic sac may be contributory to pain- not definitive. Therefore, preferable to intervene sooner than later." Per ID, would consider in order of importance to 1) treat bacteremia, 2) address aorto-bifem leak, 3) TEE, 4) MRI/CT L-spine. IF we decide to do CT, this can be done in-house, vs RI would require transfer. Will plan to proceed w/ IV abx, do vascular procedure tomorrow as scheduled, and see how he progresses from there.  10/09: Vascular procedure today to fix graft.  Radiology reached out this morning, concern for small PE noted on CTA abdomen/pelvis.  Holding off on full CTA chest at this time due to concern with contrast/renal function but will plan for heparin. 10/10: Renal function okay. CT w/ contrast lumbar spine - no osteomyelitis noted. CTA chest no PE but Significant mucus within RIGHT mainstem bronchus extending into bronchus intermedius and RIGHT lower lobe. Continue IV fluids and recheck BMP in AM. TEE planned for 10/12.  10/11 TEE plan for tomorrow. Palliative consulted 10/12 TEE this am. [facility] declined any transfer to [facility] for imaging /MRI back. 10/13 c/o back pain 10/14 family had discussion with hospice and would like to proceed with MRI.told wife need to make sure pacer compatable with mri at [facility] 10/16 called Radiology /MRI, pts pacemaker not compatable, unable to obtain spine MRI. Neurosurgery consulted  10/17 neurosurgery ordered brace.  Neurosurgery recommended consultation with palliative care.  Not a candidate for NSAIDs due to severe kidney disease.  Once he is  discharged with neurosurgery recommends physiatry service at [facility].  He has previously seen Dr. Sharlet Salina.  Added Mucinex.  Consultants:  Vascular surgery - TAA, AAA enlarged, R common iliac aneurysm Nephrology - CKD4 needing contrast  study Cardiology - clearance for vascular procedure given CAD/CABG, Chronic HFpEF ID - (+)Bcx  Procedures: 08/16/2022: Repair of aortic aneurysm sac type II endoleak  Antimicrobials:  Ampicillin Ceftriaxone   Subjective: Per wife patient is coughing would like something for his cough.  Otherwise he slept last night.  The regimen where he is on appears to be helping.  Denies shortness of breath or chest pain  Objective: Vitals:   08/23/22 1511 08/23/22 2116 08/24/22 0311 08/24/22 0811  BP: 128/72 133/67 (!) 156/72 138/67  Pulse: 85 80 84 79  Resp: 18   19  Temp:  99.1 F (37.3 C) 98.6 F (37 C) 98.4 F (36.9 C)  TempSrc:  Oral Oral Oral  SpO2: 96% 98% 100% 99%  Weight:      Height:        Intake/Output Summary (Last 24 hours) at 08/24/2022 0835 Last data filed at 08/24/2022 0500 Gross per 24 hour  Intake 1440 ml  Output 1400 ml  Net 40 ml   Filed Weights   08/19/22 0512 08/19/22 0748 08/23/22 0517  Weight: 93.4 kg 93.4 kg 89.9 kg    Examination: Calm, NAD Cta no w/r Reg s1/s2 no gallop Soft benign +bs SCD bilateral Awake and alert Mood and affect appropriate in current setting    Data Reviewed: I have personally reviewed following labs and imaging studies  CBC: Recent Labs  Lab 08/18/22 0406 08/22/22 0519 08/24/22 0604  WBC 7.2 10.6* 8.7  HGB 9.4* 7.9* 7.2*  HCT 30.0* 25.0* 23.8*  MCV 88.8 88.7 89.1  PLT 193 221 604   Basic Metabolic Panel: Recent Labs  Lab 08/18/22 0406 08/20/22 0606 08/22/22 0519 08/24/22 0604  NA 141  --  138 138  K 3.9  --  3.5 3.7  CL 109  --  109 108  CO2 20*  --  21* 23  GLUCOSE 115*  --  117* 101*  BUN 30*  --  32* 35*  CREATININE 2.05* 2.05* 1.99* 1.88*  CALCIUM 8.8*  --  8.4* 8.6*  PHOS  --   --  3.6  --    GFR: Estimated Creatinine Clearance: 32.8 mL/min (A) (by C-G formula based on SCr of 1.88 mg/dL (H)). Liver Function Tests: Recent Labs  Lab 08/22/22 0519  ALBUMIN 2.3*   No results for input(s):  "LIPASE", "AMYLASE" in the last 168 hours. No results for input(s): "AMMONIA" in the last 168 hours. Coagulation Profile: Recent Labs  Lab 08/17/22 1314  INR 1.2   Cardiac Enzymes: No results for input(s): "CKTOTAL", "CKMB", "CKMBINDEX", "TROPONINI" in the last 168 hours. BNP (last 3 results) No results for input(s): "PROBNP" in the last 8760 hours. HbA1C: No results for input(s): "HGBA1C" in the last 72 hours. CBG: No results for input(s): "GLUCAP" in the last 168 hours. Lipid Profile: No results for input(s): "CHOL", "HDL", "LDLCALC", "TRIG", "CHOLHDL", "LDLDIRECT" in the last 72 hours. Thyroid Function Tests: No results for input(s): "TSH", "T4TOTAL", "FREET4", "T3FREE", "THYROIDAB" in the last 72 hours. Anemia Panel: No results for input(s): "VITAMINB12", "FOLATE", "FERRITIN", "TIBC", "IRON", "RETICCTPCT" in the last 72 hours. Sepsis Labs: No results for input(s): "PROCALCITON", "LATICACIDVEN" in the last 168 hours.   Recent Results (from the past 240 hour(s))  Culture, blood (Routine X 2) w  Reflex to ID Panel     Status: None   Collection Time: 08/15/22 12:22 PM   Specimen: Right Antecubital; Blood  Result Value Ref Range Status   Specimen Description RIGHT ANTECUBITAL  Final   Special Requests   Final    BOTTLES DRAWN AEROBIC AND ANAEROBIC Blood Culture adequate volume   Culture   Final    NO GROWTH 5 DAYS Performed at [facility], [address] [place], [place] [address]    Report Status 08/20/2022 FINAL  Final  Culture, blood (Routine X 2) w Reflex to ID Panel     Status: None   Collection Time: 08/15/22 12:22 PM   Specimen: BLOOD RIGHT ARM  Result Value Ref Range Status   Specimen Description BLOOD RIGHT ARM  Final   Special Requests   Final    BOTTLES DRAWN AEROBIC AND ANAEROBIC Blood Culture adequate volume   Culture   Final    NO GROWTH 5 DAYS Performed at [facility], [address] [place], [place] [address]    Report Status  08/20/2022 FINAL  Final         Radiology Studies: No results found.      Scheduled Meds:  amLODipine  10 mg Oral Daily   atorvastatin  40 mg Oral QHS   calcitRIOL  0.25 mcg Oral q AM   clopidogrel  75 mg Oral Daily   donepezil  10 mg Oral QHS   ezetimibe  10 mg Oral QPM   ferrous sulfate  325 mg Oral Q2000   heparin injection (subcutaneous)  5,000 Units Subcutaneous Q8H   ipratropium  2-4 spray Each Nare QHS   lidocaine  1 patch Transdermal Q24H   loratadine  10 mg Oral Daily   melatonin  10 mg Oral QHS   methocarbamol  500 mg Oral TID   metoprolol succinate  25 mg Oral Daily   multivitamin with minerals  1 tablet Oral QPC lunch   oxyCODONE-acetaminophen  2 tablet Oral Q6H   pantoprazole  40 mg Oral QAC breakfast   polyethylene glycol  17 g Oral BID   predniSONE  40 mg Oral Q breakfast   senna-docusate  2 tablet Oral BID   zolpidem  10 mg Oral QHS   Continuous Infusions:  ampicillin (OMNIPEN) IV 2 g (08/24/22 0311)    Assessment & Plan:   Principal Problem:   Complication of aortic graft Active Problems:   COPD exacerbation (HCC)   AAA (abdominal aortic aneurysm) without rupture (HCC)   AKI (acute kidney injury) ([place])   Thoracic aortic aneurysm without rupture ([place])   Aneurysm of right common iliac artery (HCC)   CAD S/P CABG x 3   Myocardial injury   Essential hypertension   Chronic diastolic CHF (congestive heart failure) (HCC)   Chronic kidney disease, stage IV (severe) (HCC)   HLD (hyperlipidemia)   Iron deficiency anemia   TIA (transient ischemic attack)   Mild cognitive impairment   Low back pain   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 Lucky Cowboy. Now  the size has enlarged from 6.9 to 7.6 cm Status post repair on 10/9 by Dr. Lucky Cowboy         Severe back pain Lumbar OA, DDD Multiple lobar spine hardware 10/11 ct spine with degenerative disc dz, no acute issues 10/12 declined MRI of spine to be done at [facility].  Wife stated he is too weak to be being transferred and they have opted not to be transferred anywhere for any kind of imaging 10/14 Robaxin was changed from as needed to standing dose.  Still with pain.  Will coordinate MRI with contrast of spine at [facility] once we have info about his pacemaker compatibility.  10/16 MRI spine cannot be done due to pacemaker incompatability Neurosurgery consulted, ID updated 10/17 continue current regimen.  Neurosurgery will order brace.Once he is discharged with neurosurgery recommends physiatry service at [facility].  He has previously seen Dr. Sharlet Salina.  No real other options.      CAD S/P CABG x 3 Hx of CAD s/p of CABG and myocardial injury due to COPD exacerbation:  Myocardial injury less likely vs Troponin elevation d/t CKD4 Troponin level 31, 30.  Denies chest pain. Continue home Lipitor, Zetia hold ASA consulted Dr. Saralyn Pilar of cardiology per Dr. Dolores Hoose recommendation - optimized from cardiac standpoint, though risk is present. Troponins likely d/t CKD 10/17 TEE negative for vegetation        Low back pain This seems to be a chronic issue, has worsened recently. 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 There is osteopenia degenerative change of the lumbar spine with discogenic mild grade 1 retrolisthesis unchanged at L1-2 and L2-3, slight dextroscoliosis. pain control: As needed Percocet, Tylenol, Dilaudid --> scheduled po opiates w/ prn Dilaudid As needed Robaxin Lidoderm MRI L  Spine per ID recs given (+)Bcx -patient has pacemaker, family would like to avoid transferring to different facility if possible.  10/13 Robxin change to standing dose. 10/14 hospice spoke to family they have opted to proceed with MRI 10/17 as above   SOB w/ mucus plugging of bronchi noted on CT PE resolved on CTA (recently diagnosed, see above)  Transition heparin to DVT ppx dose now that no PE Given SOB, may consider pulmonary consult to review CT and eval if bronchoscopy warranted...Marland KitchenMarland Kitchen. 10/15 resolved , monitor for now Consulted pccm    AKI on Chronic kidney disease, stage IV (severe) (HCC) Slightly worsening than baseline. Today 08/17/22 Cr 2.06 (vs 2.69 on admission) Nephrology following - see note for recs re: contrast administration  Planning further contrast studies to eval PE and lumbar spine - increased fluids for now to 100 mL/h and plan to reduce this pending renal function tomorrow.  10/15 Imporved some.  AKI most likely secondary to contrast-induced nephropathy.  Now around his baseline.  Will need to follow-up with nephrology as outpatient 10/6 DC IV fluids   Positive blood cultures - E Facealis Bacteremia ID to follow 10/17 TEE negative for vegetation.  Continue with IV ampicillin  Possible COPD exacerbation ([place]) - seems less likely given CT findings from today 10/10 see above Patient is a former smoker, smoked more than 20 years.  CT chest did not show focal infiltration, does not seem to have pneumonia.  Procalcitonin elevated at 0.39.   Patient does not meet criteria for sepsis. will admit to tele bed as inpatient Bronchodilators pt was given 80 mg of Solu-Medrol in ED --> will change to oral prednisone 40  mg daily>>will dc. Z pak  Mucinex for cough  Incentive spirometry     Essential hypertension IV hydralazine as needed Metoprolol hold Cozaar since pt will receive contrast  10/13 add amlodipine. Increase amlodipine to 10mg  qd   Chronic diastolic  CHF (congestive heart failure) ([place]) 2D echo on 06/14/2022 showed EF of 60 to 65% with grade 1 diastolic dysfunction.   CHF seem to be compensated.    HLD (hyperlipidemia) Continue to Lipitor and zetia   Iron deficiency anemia Hemoglobin 8.6 (9.7 on 06/15/2022, and 8.6 on 06/13/2022), no active bleeding. 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  No acute issues.   Obesity (BMI 30-39.9) BMI= 30.68  and BW= 99.8 Diet and exercise.   Encourage to lose weight.   Anxiety start prn Xanax 0.25 mg bid     DVT prophylaxis: holding pharmacologic given bleed risk , scd Code Status:dnr Family Communication: wife Disposition Plan:  Status is: Inpatient Remains inpatient appropriate because: iv treatment,with back pain. SNF    LOS: 10 days   Time spent: 35 min    Nolberto Hanlon, MD [facility] Pager [phone]  If 7PM-7AM, please contact night-coverage 08/24/2022, 8:35 AM

## 2022-08-24 NOTE — Progress Notes (Signed)
Orthopedic Tech Progress Note Patient Details:  Rick Mcbride [birth year] [id number]  Called in order to [employer] for a LSO   Patient ID: Rick Eth., male   DOB: [birth year], 81 y.o.   MRN: [id number]  [name] 08/24/2022, 11:13 AM

## 2022-08-24 NOTE — Progress Notes (Signed)
[place], [place] 08/24/22  Subjective:   Hospital day # 10  Patient known to our practice from outpatient follow-up of CKD, with Dr Candiss Norse.  Patient was seen today on second floor.Patient wife was present in the room. Patient was resting comfortably in the bed      10/16 0701 - 10/17 0700 In: 1440 [P.O.:1440] Out: 1400 [Urine:1400] Lab Results  Component Value Date   CREATININE 1.88 (H) 08/24/2022   CREATININE 1.99 (H) 08/22/2022   CREATININE 2.05 (H) 08/20/2022     Objective:  Vital signs in last 24 hours:  Temp:  [98.3 F (36.8 C)-99.1 F (37.3 C)] 98.5 F (36.9 C) (10/17 1715) Pulse Rate:  [79-86] 86 (10/17 1715) Resp:  [19] 19 (10/17 1715) BP: (129-156)/(67-72) 129/72 (10/17 1715) SpO2:  [96 %-100 %] 97 % (10/17 1715)  Weight change:  Filed Weights   08/19/22 0512 08/19/22 0748 08/23/22 0517  Weight: 93.4 kg 93.4 kg 89.9 kg    Intake/Output:    Intake/Output Summary (Last 24 hours) at 08/24/2022 1742 Last data filed at 08/24/2022 1724 Gross per 24 hour  Intake 1680 ml  Output 2650 ml  Net -970 ml     Physical Exam: General: elderly gentleman, laying in the bed  HEENT Anicteric, moist oral mucous membranes  Pulm/lungs Normal breathing effort, clear to auscultation  CVS/Heart No rub or gallop  Abdomen:  Soft, nontender  Extremities: No peripheral edema  Neurologic: Alert, oriented, pain with movement of right leg  Skin: No acute rashes          Basic Metabolic Panel:  Recent Labs  Lab 08/18/22 0406 08/20/22 0606 08/22/22 0519 08/24/22 0604  NA 141  --  138 138  K 3.9  --  3.5 3.7  CL 109  --  109 108  CO2 20*  --  21* 23  GLUCOSE 115*  --  117* 101*  BUN 30*  --  32* 35*  CREATININE 2.05* 2.05* 1.99* 1.88*  CALCIUM 8.8*  --  8.4* 8.6*  PHOS  --   --  3.6  --      CBC: Recent Labs  Lab 08/18/22 0406 08/22/22 0519 08/24/22 0604  WBC 7.2 10.6* 8.7  HGB 9.4* 7.9* 7.2*  HCT 30.0* 25.0* 23.8*   MCV 88.8 88.7 89.1  PLT 193 221 217     No results found for: "HEPBSAG", "HEPBSAB", "HEPBIGM"    Microbiology:  Recent Results (from the past 240 hour(s))  Culture, blood (Routine X 2) w Reflex to ID Panel     Status: None   Collection Time: 08/15/22 12:22 PM   Specimen: Right Antecubital; Blood  Result Value Ref Range Status   Specimen Description RIGHT ANTECUBITAL  Final   Special Requests   Final    BOTTLES DRAWN AEROBIC AND ANAEROBIC Blood Culture adequate volume   Culture   Final    NO GROWTH 5 DAYS Performed at [facility], [address] [place], [place] [address]    Report Status 08/20/2022 FINAL  Final  Culture, blood (Routine X 2) w Reflex to ID Panel     Status: None   Collection Time: 08/15/22 12:22 PM   Specimen: BLOOD RIGHT ARM  Result Value Ref Range Status   Specimen Description BLOOD RIGHT ARM  Final   Special Requests   Final    BOTTLES DRAWN AEROBIC AND ANAEROBIC Blood Culture adequate volume   Culture   Final    NO GROWTH 5 DAYS  Performed at [facility], [address] [place], [place] [address]    Report Status 08/20/2022 FINAL  Final    Coagulation Studies: No results for input(s): "LABPROT", "INR" in the last 72 hours.  Urinalysis: No results for input(s): "COLORURINE", "LABSPEC", "PHURINE", "GLUCOSEU", "HGBUR", "BILIRUBINUR", "KETONESUR", "PROTEINUR", "UROBILINOGEN", "NITRITE", "LEUKOCYTESUR" in the last 72 hours.  Invalid input(s): "APPERANCEUR"     Imaging: No results found.   Medications:    ampicillin (OMNIPEN) IV 2 g (08/24/22 1602)    amLODipine  10 mg Oral Daily   atorvastatin  40 mg Oral QHS   calcitRIOL  0.25 mcg Oral q AM   clopidogrel  75 mg Oral Daily   donepezil  10 mg Oral QHS   ezetimibe  10 mg Oral QPM   ferrous sulfate  325 mg Oral Q2000   guaiFENesin  600 mg Oral BID   heparin injection (subcutaneous)  5,000 Units Subcutaneous Q8H   ipratropium  2-4 spray Each Nare QHS   lidocaine  1  patch Transdermal Q24H   loratadine  10 mg Oral Daily   melatonin  10 mg Oral QHS   methocarbamol  500 mg Oral TID   metoprolol succinate  25 mg Oral Daily   multivitamin with minerals  1 tablet Oral QPC lunch   oxyCODONE-acetaminophen  2 tablet Oral Q6H   pantoprazole  40 mg Oral QAC breakfast   polyethylene glycol  17 g Oral BID   senna-docusate  2 tablet Oral BID   zolpidem  10 mg Oral QHS   acetaminophen, albuterol, chlorproMAZINE, dextromethorphan-guaiFENesin, hydrALAZINE, HYDROmorphone (DILAUDID) injection, ondansetron (ZOFRAN) IV  Assessment/ Plan:  81 y.o. 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, history of stroke   admitted on 08/14/2022 for SOB (shortness of breath) [R06.02] Bronchitis [J40] Elevated troponin [R79.89] COPD exacerbation (HCC) [J44.1] CAP (community acquired pneumonia) [J18.9] Generalized weakness [R53.1] AKI (acute kidney injury) ([place]) [N17.9] Acute midline low back pain without sciatica [M54.50]   Impression 1-Acute kidney injury Patient had AKI most likely secondary to contrast-induced nephropathy Patient peak creatinine was at 2.7 on October 7 now it has come down to his baseline of around 1.9--2.0  2-CKD stage 3b/ IV Patient has CKD most likely secondary to hypertension With some contribution from age associated  decline as patient is 81 years old Kidney function is at baseline.  Urinalysis shows small proteinuria.     3-Anemia of chronic disease Patient hemoglobin is low No need for transfusion for now   4-E faecalis bacteremia Patient is being followed by ID  5-Diastolic CHF   Patient is well compensated   2D echo from 06/14/2022 shows LVEF 60 to 70%, grade 1 diastolic dysfunction  6-Chronic metabolic acidosis  patient bicarb is at around 21 no need  for p.o. bicarb for now   Plan: Creatinine stable.    Primary team to continue IV antibiotics.       LOS: [place] 10/17/20235:42 PM  [place], [place]  Note: This note was prepared with Dragon dictation. Any transcription errors are unintentional

## 2022-08-24 NOTE — Progress Notes (Signed)
Occupational Therapy Treatment Patient Details Name: Rick Mcbride. MRN: [id number] DOB: 1940/12/21 Today's Date: 08/24/2022   History of present illness Rick Mcbride. is a 81 y.o. 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     Patient was recently hospitalized from 8/2 - 8/8 due to right femoral neck fracture.  Patient had surgery and finished the rehab.  Currently he is doing PT/OT at home.  He states that in the past several days, his lower back pain has worsened, difficult walking and doing physical therapy.  The lower back pain is constant, 7 out of 10 in severity, aching, nonradiating.  No loss control of bladder or bowel movement.  Denies new injury.  Patient also reports shortness of breath and dry cough.  No chest pain, fever or chills.  Denies nausea, vomiting, diarrhea or abdominal pain. Pt still has some mild right hip pain.  No symptoms of UTI. Pt had negative PE by V/Q scan which is ordered by his PCP yesterday.   OT comments  Pt seen for OT tx session this date.  Pt declined any seated ADLs but was agreeable to attempt standing.  OT positioned bed close to sink countertop.  Pt practiced sit to stand pushing from bed and then pulling from sink countertop upon standing, working to increase standing and functional transfers for ADL participation.  Pt completed 5 trials, 3 times at 30 sec, 2x <15 sec, those limited by LBP; all with mod A from elevated bed.  Pt participated in scooting toward [facility] with min A, and requiring max A for sit  to supine.  Pt and spouse pleased with participation this date.  Pt will continue to benefit from skilled OT in the acute setting to maximize safety and indep with ADLs and functional transfers.  Continue to recommend SNF at d/c for same.  OT left pt in bed with all needs met, spouse present.   Recommendations for follow up therapy are one component of a multi-disciplinary discharge planning process, led by the attending physician.  Recommendations may be updated based on patient status, additional functional criteria and insurance authorization.    Follow Up Recommendations  Skilled nursing-short term rehab (<3 hours/day)    Assistance Recommended at Discharge Frequent or constant Supervision/Assistance  Patient can return home with the following  Two people to help with walking and/or transfers;A lot of help with bathing/dressing/bathroom;Assistance with cooking/housework;Direct supervision/assist for financial management;Assist for transportation;Help with stairs or ramp for entrance;Direct supervision/assist for medications management   Equipment Recommendations  Other (comment) (defer to next venue of care)          Precautions / Restrictions Precautions Precautions: Fall;Back Restrictions Weight Bearing Restrictions: No       Mobility Bed Mobility Overal bed mobility: Needs Assistance Bed Mobility: Rolling, Supine to Sit, Sit to Supine Rolling: Mod assist   Supine to sit: HOB elevated, Mod assist Sit to supine: Max assist   General bed mobility comments: Pt participated in scooting toward [facility] in prep for sit to supine transfer, min A for sufficient scooting. Patient Response: Cooperative  Transfers  Overall transfer level: Needs assistance   Transfers: Sit to/from Stand Sit to Stand: Mod assist, From elevated surface           General transfer comment: OT positioned bed close to sink countertop.  Pt practiced sit to stand pushing from bed and then pulling from sink  countertop upon standing.  Pt completed 5 trials, 3 times at 30 sec, 2x <15 sec, those limited by LBP.     Balance Overall balance assessment: Needs assistance Sitting-balance support: Bilateral upper extremity supported, Feet supported Sitting balance-Leahy Scale: Fair Sitting balance - Comments: EOB sitting with report of dizziness.  Subsided after several min of sitting and no c/o dizziness in standing.   Standing balance support: Bilateral upper extremity supported, Reliant on assistive device for balance Standing balance-Leahy Scale: Poor Standing balance comment: Pt stood for 5 trials from EOB, using sink countertop for support.                           ADL either performed or assessed with clinical judgement   ADL                                         General ADL Comments: Pt declined ADLs this afternoon.  Pt participated in sit to stand with bed pushed up to sink, working to increase standing and functional transfers for ADL participation.    Extremity/Trunk Assessment Upper Extremity Assessment Upper Extremity Assessment: Generalized weakness   Lower Extremity Assessment Lower Extremity Assessment: Generalized weakness   Cervical / Trunk Assessment Cervical / Trunk Assessment: Kyphotic    Vision Baseline Vision/History: 1 Wears glasses Patient Visual Report: No change from baseline                Cognition   Behavior During Therapy: WFL for tasks assessed/performed Overall Cognitive Status: History of cognitive impairments - at baseline                                          Exercises Other Exercises Other Exercises: Cues for pursed lip, slow breaths after each standing trial to promote deep breathing and relaxation.  Pt had a higher level of anxiety in standing which resulted in almost hyperventilation upon sitting, but responded well to cues noted above.                 Pertinent Vitals/ Pain        Pain Assessment Faces Pain Scale: Hurts even more Pain Location: low back Pain Descriptors / Indicators: Aching, Discomfort, Grimacing, Sharp Pain Intervention(s): Limited activity within patient's tolerance, Monitored during session, Repositioned, Relaxation  Home Living                                          Prior Functioning/Environment                         Progress Toward Goals  OT Goals(current goals can now be found in the care plan section)  Progress towards OT goals: Progressing toward goals  Acute Rehab OT Goals Patient Stated Goal: reduce pain, get stronger OT Goal Formulation: With  patient/family Time For Goal Achievement: 09/04/22 Potential to Achieve Goals: [place] Discharge plan remains appropriate                    AM-PAC OT "6 Clicks" Daily Activity     Outcome Measure   Help from another person eating meals?: A Little Help from another person taking care of personal grooming?: A Little Help from another person toileting, which includes using toliet, bedpan, or urinal?: A Lot Help from another person bathing (including washing, rinsing, drying)?: A Lot Help from another person to put on and taking off regular upper body clothing?: A Little Help from another person to put on and taking off regular lower body clothing?: A Lot 6 Click Score: 15    End of Session Equipment Utilized During Treatment: Gait belt  OT Visit Diagnosis: Other abnormalities of gait and mobility (R26.89);Muscle weakness (generalized) (M62.81);Pain;Other symptoms and signs involving cognitive function Pain - part of body:  (LBP)   Activity Tolerance Patient limited by pain;Patient limited by fatigue   Patient Left in bed;with call bell/phone within reach;with bed alarm set;with family/visitor present   Nurse Communication Mobility status        Time: 8916-9450 OT Time Calculation (min): 38 min  Charges: OT General Charges $OT Visit:  1 Visit OT Treatments $Therapeutic Exercise: 38-52 mins  Leta Speller, MS, OTR/L   Darleene Cleaver 08/24/2022, 4:38 PM

## 2022-08-25 DIAGNOSIS — R531 Weakness: Secondary | ICD-10-CM

## 2022-08-25 DIAGNOSIS — M545 Low back pain, unspecified: Secondary | ICD-10-CM | POA: Diagnosis not present

## 2022-08-25 DIAGNOSIS — B952 Enterococcus as the cause of diseases classified elsewhere: Secondary | ICD-10-CM | POA: Diagnosis not present

## 2022-08-25 DIAGNOSIS — N179 Acute kidney failure, unspecified: Secondary | ICD-10-CM | POA: Diagnosis not present

## 2022-08-25 DIAGNOSIS — I712 Thoracic aortic aneurysm, without rupture, unspecified: Secondary | ICD-10-CM | POA: Diagnosis not present

## 2022-08-25 DIAGNOSIS — R7881 Bacteremia: Secondary | ICD-10-CM | POA: Diagnosis not present

## 2022-08-25 DIAGNOSIS — I723 Aneurysm of iliac artery: Secondary | ICD-10-CM | POA: Diagnosis not present

## 2022-08-25 NOTE — Consult Note (Signed)
PULMONOLOGY         Date: 08/25/2022,   MRN# [id number] Rick Mcbride. Oct 22, 1941     AdmissionWeight: 99.8 kg                 CurrentWeight: 93 kg  Referring provider: Dr Kurtis Bushman   CHIEF COMPLAINT:   Abnormal CT chest    HISTORY OF PRESENT ILLNESS   This is a 81 year old male with a history of abdominal aortic aneurysm, chronic anemia, anxiety disorder B12 deficiency coronary artery disease cervical radiculopathy, chronic airway obstruction, CKD, chronic pain syndrome, degenerative disc disease elbow fracture, GERD, history of hemorrhoids and colonic polyps, essential hypertension mild cognitive impairment, dyslipidemia, prostate cancer and squamous cell lung cancer on the right status post partial lobectomy in 2014, history of CVA who came in with lower back pain and shortness of breath.  Was hospitalized in August due to right femoral neck fracture.  He has completed rehab.  On admission his blood work showed likely progressed CKD compared to baseline and an enlarged AAA.  He had vascular surgery evaluation with possible surgery and.  He also had urine culture with findings of over 100,000 colonies of Enterococcus faecalis.  He had blood cultures performed and this was also positive for Enterococcus faecalis.  I reviewed his CT chest with findings of no pulmonary embolism, and previous left upper lobe pulmonary artery embolus is no longer seen on this study. He does have bilateral pleural effusions to mild severity which may be contributing to his dyspnea with associated atelectasis.  08/25/22 - patient stable from pulmonary standpoint will clear for dc and can follow up on outpatient basis  PAST MEDICAL HISTORY   Past Medical History:  Diagnosis Date   AAA (abdominal aortic aneurysm) ([place])    a.) s/p EVAR 01/05/2017. b.) native aneurysm sac 6.6 x 6.9 cm by CT on 03/31/2021   Abnormality of tongue    a.) CT head/neck 07/10/2021 --> asymmetric soft tissue at the  RIGHT tongue base with a superficial 8 mm lesion.   Anemia    Aneurysm of right common iliac artery (HCC)    a.) measured 2.7 cm by CT on 03/31/2021   Anxiety    Aortic atherosclerosis (HCC)    Atrophic kidney    B12 deficiency    CAD (coronary artery disease)    Cervical radiculopathy    Chronic airway obstruction (HCC)    Chronic kidney disease (CKD), stage III (moderate) (HCC)    Chronic pain syndrome 06/16/2021   Chronic right shoulder pain 11/12/2019   Chronic tension headaches    Chronic, continuous use of opioids 06/16/2021   Coronary artery disease    DDD (degenerative disc disease), lumbar    Degenerative disc disease, lumbar    with lumbar radiculopathy   Elbow fracture, left    GERD (gastroesophageal reflux disease)    H/O adenomatous polyp of colon    H/O hemorrhoids    HTN (hypertension)    Hyperlipidemia    Meralgia paresthetica    Mild cognitive impairment    Nephrolithiasis    Neuralgia    Numbness of right foot 02/19/2021   Osteoarthritis    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   Presence of permanent cardiac pacemaker    Prostate cancer ([place])    a.) s/p prostatectomy   S/P CABG x 3 05/01/2004   a.) LVEF 40-49%; LIMA-LAD, SVG-OM1, SVG-PDA   Second degree AV block  Sinoatrial node dysfunction (HCC)    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[place]) 01/31/2013   a.) RLL squamous cell carcinoma   Status post partial lobectomy of lung    a.) s/p RLL resection on 01/19/2013   Stroke [facility])    TIA (transient ischemic attack)    Valvular regurgitation    a.) TTE 10/24/2019 --> LVEF 45-50%; trivial TR, mild AR and MR; moderate LA dilitation.     SURGICAL HISTORY   Past Surgical History:  Procedure Laterality Date   AORTIC INTERVENTION N/A 08/16/2022   Procedure: AORTIC INTERVENTION Type II endoleak;  Surgeon: Algernon Huxley, MD;  Location: [place] CV LAB;  Service: Cardiovascular;  Laterality: N/A;   CATARACT EXTRACTION  Bilateral    COLONOSCOPY     COLONOSCOPY     COLONOSCOPY WITH PROPOFOL N/A 10/20/2015   Procedure: COLONOSCOPY WITH PROPOFOL;  Surgeon: Manya Silvas, MD;  Location: [facility] ENDOSCOPY;  Service: Endoscopy;  Laterality: N/A;   COLONOSCOPY WITH PROPOFOL N/A 11/28/2020   Procedure: COLONOSCOPY WITH PROPOFOL;  Surgeon: Robert Bellow, MD;  Location: [facility] ENDOSCOPY;  Service: Endoscopy;  Laterality: N/A;   CORONARY ARTERY BYPASS GRAFT N/A 05/01/2004   Procedure: 3v CABG (LIMA-LAD, SVG-OM1, SVG-PDA); Location: [facility]; Surgeon: Ander Gaster, MD   EMBOLIZATION Right 12/27/2016   Procedure: Embolization;  Surgeon: Algernon Huxley, MD;  Location: [place] [facility];  Service: Cardiovascular;  Laterality: Right;   ENDOVASCULAR REPAIR/STENT GRAFT N/A 01/05/2017   Procedure: Endovascular Repair/Stent Graft;  Surgeon: Algernon Huxley, MD;  Location: [place] [facility];  Service: Cardiovascular;  Laterality: N/A;   HIP ARTHROPLASTY Right 06/10/2022   Procedure: ARTHROPLASTY BIPOLAR HIP (HEMIARTHROPLASTY);  Surgeon: [name], MD;  Location: [facility] ORS;  Service: Orthopedics;  Laterality: Right;   INSERT / REPLACE / REMOVE PACEMAKER     JOINT REPLACEMENT     shoulder and knees   KNEE ARTHROSCOPY     LUNG LOBECTOMY Right 01/31/2013   Procedure: RIGHT PULMONARY LOBECTOMY; Location: [facility]; Surgeon: Nestor Lewandowsky, MD   MICROLARYNGOSCOPY Right 08/10/2021   Procedure: MICRODIRECT LARYNGOSCOPY WITH BIOPSY OF TONGUE BASE;  Surgeon: Beverly Gust, MD;  Location: [facility] ORS;  Service: ENT;  Laterality: Right;   PACEMAKER INSERTION  12/2012   Dual chanber pacemaker generator   POLYPECTOMY     POSTERIOR LUMBAR FUSION     Procedure: POSTERIOR LUMBAR INTERBODY FUSION, INTERBODY PROSTHESIS, POSTERIOR LATERAL ARTHRODESIS, POSTERIOR NON-SEGMENTAL INSTRUMENTATION LUMBAR FIVE- SACRAL ONE; Location: [facility]; Surgeon: Newman Pies, MD   PROSTATECTOMY     TEE WITHOUT CARDIOVERSION N/A 08/19/2022   Procedure:  TRANSESOPHAGEAL ECHOCARDIOGRAM (TEE);  Surgeon: Corey Skains, MD;  Location: [facility] ORS;  Service: Cardiovascular;  Laterality: N/A;   TONGUE BIOPSY N/A 08/10/2021   Procedure: TONGUE BIOPSY;  Surgeon: Beverly Gust, MD;  Location: [facility] ORS;  Service: ENT;  Laterality: N/A;   TOTAL KNEE ARTHROPLASTY Bilateral    TOTAL SHOULDER ARTHROPLASTY Left 07/10/2015   Procedure: TOTAL SHOULDER ARTHROPLASTY;  Surgeon: [name], MD;  Location: [facility] ORS;  Service: Orthopedics;  Laterality: Left;   TOTAL SHOULDER REPLACEMENT       FAMILY HISTORY   Family History  Problem Relation Age of Onset   Heart attack Mother    Heart attack Father    Breast cancer Sister    Asthma Sister      SOCIAL HISTORY   Social History   Tobacco Use   Smoking status: Former    Packs/day: 1.50  Years: 45.00    Total pack years: 67.50    Types: Cigarettes    Quit date: 04/07/2004    Years since quitting: 18.3   Smokeless tobacco: Never  Vaping Use   Vaping Use: Never used  Substance Use Topics   Alcohol use: No   Drug use: No     MEDICATIONS    Home Medication:    Current Medication:  Current Facility-Administered Medications:    acetaminophen (TYLENOL) tablet 1,000 mg, 1,000 mg, Oral, Q6H PRN, Sharion Settler, NP, 1,000 mg at 08/21/22 0148   albuterol (PROVENTIL) (2.5 MG/3ML) 0.083% nebulizer solution 2.5 mg, 2.5 mg, Nebulization, Q4H PRN, Lucky Cowboy, Erskine Squibb, MD   amLODipine (NORVASC) tablet 10 mg, 10 mg, Oral, Daily, Amery, Sahar, MD, 10 mg at 08/25/22 0829   ampicillin (OMNIPEN) 2 g in sodium chloride 0.9 % 100 mL IVPB, 2 g, Intravenous, Q6H, Coulter, Carolyn, RPH, Last Rate: 300 mL/hr at 08/25/22 1043, 2 g at 08/25/22 1043   atorvastatin (LIPITOR) tablet 40 mg, 40 mg, Oral, QHS, Dew, Erskine Squibb, MD, 40 mg at 08/24/22 2222   calcitRIOL (ROCALTROL) capsule 0.25 mcg, 0.25 mcg, Oral, q AM, Lucky Cowboy, Erskine Squibb, MD, 0.25 mcg at 08/25/22 1937   chlorproMAZINE (THORAZINE) tablet 25 mg, 25 mg, Oral, TID PRN,  Nolberto Hanlon, MD, 25 mg at 08/23/22 2124   clopidogrel (PLAVIX) tablet 75 mg, 75 mg, Oral, Daily, Dew, Erskine Squibb, MD, 75 mg at 08/25/22 0829   dextromethorphan-guaiFENesin (Uriah DM) 30-600 MG per 12 hr tablet 1 tablet, 1 tablet, Oral, BID PRN, Algernon Huxley, MD, 1 tablet at 08/23/22 2122   donepezil (ARICEPT) tablet 10 mg, 10 mg, Oral, QHS, Dew, Erskine Squibb, MD, 10 mg at 08/24/22 2222   ezetimibe (ZETIA) tablet 10 mg, 10 mg, Oral, QPM, Dew, Erskine Squibb, MD, 10 mg at 08/24/22 2222   ferrous sulfate tablet 325 mg, 325 mg, Oral, Q2000, Algernon Huxley, MD, 325 mg at 08/24/22 2222   guaiFENesin (MUCINEX) 12 hr tablet 600 mg, 600 mg, Oral, BID, Amery, Sahar, MD, 600 mg at 08/25/22 0829   heparin injection 5,000 Units, 5,000 Units, Subcutaneous, Q8H, Emeterio Reeve, DO, 5,000 Units at 08/25/22 0532   hydrALAZINE (APRESOLINE) injection 5 mg, 5 mg, Intravenous, Q2H PRN, Algernon Huxley, MD, 5 mg at 08/21/22 9024   HYDROmorphone (DILAUDID) injection 1 mg, 1 mg, Intravenous, Q2H PRN, Emeterio Reeve, DO, 1 mg at 08/21/22 0925   ipratropium (ATROVENT) 0.06 % nasal spray 2-4 spray, 2-4 spray, Each Nare, QHS, Dew, Erskine Squibb, MD, 2 spray at 08/24/22 2227   lidocaine (LIDODERM) 5 % 1 patch, 1 patch, Transdermal, Q24H, Dew, Erskine Squibb, MD, 1 patch at 08/25/22 0828   loratadine (CLARITIN) tablet 10 mg, 10 mg, Oral, Daily, Dew, Erskine Squibb, MD, 10 mg at 08/24/22 2222   melatonin tablet 10 mg, 10 mg, Oral, QHS, Dew, Erskine Squibb, MD, 10 mg at 08/24/22 2222   methocarbamol (ROBAXIN) tablet 500 mg, 500 mg, Oral, TID, Kurtis Bushman, Sahar, MD, 500 mg at 08/25/22 0973   metoprolol succinate (TOPROL-XL) 24 hr tablet 25 mg, 25 mg, Oral, Daily, Dew, Erskine Squibb, MD, 25 mg at 08/25/22 5329   multivitamin with minerals tablet 1 tablet, 1 tablet, Oral, QPC lunch, Algernon Huxley, MD, 1 tablet at 08/24/22 1336   ondansetron (ZOFRAN) injection 4 mg, 4 mg, Intravenous, Q8H PRN, Dew, Erskine Squibb, MD   oxyCODONE-acetaminophen (PERCOCET/ROXICET) 5-325 MG per tablet 2  tablet, 2 tablet, Oral, Q6H, Emeterio Reeve, DO, 2 tablet  at 08/25/22 0828   pantoprazole (PROTONIX) EC tablet 40 mg, 40 mg, Oral, QAC breakfast, Dew, Erskine Squibb, MD, 40 mg at 08/25/22 0828   polyethylene glycol (MIRALAX / GLYCOLAX) packet 17 g, 17 g, Oral, BID, Emeterio Reeve, DO, 17 g at 08/25/22 8341   senna-docusate (Senokot-S) tablet 2 tablet, 2 tablet, Oral, BID, Emeterio Reeve, DO, 2 tablet at 08/25/22 0829   zolpidem (AMBIEN) tablet 10 mg, 10 mg, Oral, QHS, Algernon Huxley, MD, 10 mg at 08/24/22 2222    ALLERGIES   Patient has no known allergies.     REVIEW OF SYSTEMS    Review of Systems:  Gen:  Denies  fever, sweats, chills weigh loss  HEENT: Denies blurred vision, double vision, ear pain, eye pain, hearing loss, nose bleeds, sore throat Cardiac:  No dizziness, chest pain or heaviness, chest tightness,edema Resp:   reports dyspnea chronically  Gi: Denies swallowing difficulty, stomach pain, nausea or vomiting, diarrhea, constipation, bowel incontinence Gu:  Denies bladder incontinence, burning urine Ext:   Denies Joint pain, stiffness or swelling Skin: Denies  skin rash, easy bruising or bleeding or hives Endoc:  Denies polyuria, polydipsia , polyphagia or weight change Psych:   Denies depression, insomnia or hallucinations   Other:  All other systems negative   VS: BP (!) 144/70 (BP Location: Right Arm)   Pulse 77   Temp 98.1 F (36.7 C)   Resp 18   Ht 5\' 11"  (1.803 m)   Wt 93 kg   SpO2 98%   BMI 28.60 kg/m      PHYSICAL EXAM    GENERAL:NAD, no fevers, chills, no weakness no fatigue HEAD: Normocephalic, atraumatic.  EYES: Pupils equal, round, reactive to light. Extraocular muscles intact. No scleral icterus.  MOUTH: Moist mucosal membrane. Dentition intact. No abscess noted.  EAR, NOSE, THROAT: Clear without exudates. No external lesions.  NECK: Supple. No thyromegaly. No nodules. No JVD.  PULMONARY: decreased breath sounds with mild rhonchi  worse at bases bilaterally.  CARDIOVASCULAR: S1 and S2. Regular rate and rhythm. No murmurs, rubs, or gallops. No edema. Pedal pulses 2+ bilaterally.  GASTROINTESTINAL: Soft, nontender, nondistended. No masses. Positive bowel sounds. No hepatosplenomegaly.  MUSCULOSKELETAL: No swelling, clubbing, or edema. Range of motion full in all extremities.  NEUROLOGIC: Cranial nerves II through XII are intact. No gross focal neurological deficits. Sensation intact. Reflexes intact.  SKIN: No ulceration, lesions, rashes, or cyanosis. Skin warm and dry. Turgor intact.  PSYCHIATRIC: Mood, affect within normal limits. The patient is awake, alert and oriented x 3. Insight, judgment intact.       IMAGING     ASSESSMENT/PLAN   Dyspnea on exertion     - patient has severe and numerous comorbid conditions contributing to dyspnea. #1-pleural effusions bilaterally with associated atelectatic changes.  Ideally would like to diuresis however this is limited due to CKD. #2-centrilobular emphysema with COPD-appears to be chronic stable would recommend generic COPD care path with DuoNebs every 6 hours as needed as well as bronchopulmonary hygiene with incentive spirometry.  Consider MetaNeb with albuterol for recruitment. #3-CAD s/p PCI with chronic angina - cardio on case appreciate input - s/p TEE with reassuring fnidings.  Grade 1 - HFPEF-  #4- Lung cancer s/p lobectomy or RUL  #5CKD - ongoing fluid shift with interstitial edema and pleural effusion- may benefit from fluid restriction renal diet #6 sepsis -present on admission - with E.fecalis bacteremia and UTI - generally causes additional fatigue and dyspnea while acutely ill -  treat with appropriate abx, sensitivity panel noted  #7 - Enlarging AAA with mass effect and back pain - vascular surgery on case appreciate input    Ottie Glazier, M.D.  Pulmonary & [place]              Thank you for allowing me  to participate in the care of this patient.   Patient/Family are satisfied with care plan and all questions have been answered.    Provider disclosure: Patient with at least one acute or chronic illness or injury that poses a threat to life or bodily function and is being managed actively during this encounter.  All of the below services have been performed independently by signing provider:  review of prior documentation from internal and or external health records.  Review of previous and current lab results.  Interview and comprehensive assessment during patient visit today. Review of current and previous chest radiographs/CT scans. Discussion of management and test interpretation with health care team and patient/family.   This document was prepared using Dragon voice recognition software and may include unintentional dictation errors.     Ottie Glazier, M.D.  Division of Pulmonary & Critical Care Medicine

## 2022-08-25 NOTE — Progress Notes (Signed)
Progress Note   Patient: Rick Mcbride. OIZ:[id number] DOB: 13-Jul-1941 DOA: 08/14/2022     11 DOS: the patient was seen and examined on 08/25/2022   Brief hospital course: [name]. is a 81 y.o. 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 Patient had surgery and finished SNF rehab. Currently he is doing PT/OT at home), chronic lower back pain (L5-S1 fusion), who presents with lower back pain and shortness breath. Pt had negative PE by V/Q scan which is ordered by his PCP day PTA, which was done for persistent SOB postoperatively. . 10/07: WBC 3.8, negative COVID PCR, lactic acid 0.9, troponin level 31, 30, slightly worsening renal function with creatinine 2.69, BUN 31, GFR 23 (recent baseline creatinine 2.39 06/15/2022), temperature normal, blood pressure 172/84, 129/64, RR 83, heart rate 83, RR 17, oxygen saturation 98% on room air.  Chest x-ray showed possible mild patchy infiltration in left lower lobe.  CT head negative.  CT scan showed enlargement of AAA from previous 6.9 to 7.6 cm now.  Patient is admitted to telemetry bed as inpatient. Dr. Lorenso Courier of [facility] was consulted re: AAA, needs CTA, nephrology consulted and recommended IV fluids prior to CTA of Abd/Pelvis. Cardiology also consulted for surgical optimization/risk stratification - medically optimized at this time, troponin mild elevation likely d/t CKD4. BCx (+) E Faecalis, ID recommended r/o endocarditis/pacer infection w/ TEE and vascular surgery to assess re: graft involvement, changed Abx to ampicillin and repeat Bcx, also recommend MRI L-spine or  CT w/ contrast, pt has pacemaker   . 10/08: Pt and wife agreeable to TEE and MRI, though MRI would have to be at [facility]. Per vascular: "Noted bacteremia- however, patient with chronic progressive lower back pain; expanding aortic sac may be contributory to pain- not definitive. Therefore, preferable to intervene sooner than later." Per ID, would consider in order of importance to 1) treat bacteremia, 2) address aorto-bifem leak, 3) TEE, 4) MRI/CT L-spine. IF we decide to do CT, this can be done in-house, vs RI would require transfer. Will plan to proceed w/ IV abx, do vascular procedure tomorrow as scheduled, and see how he progresses from there.  . 10/09: Vascular procedure today to fix graft.  Radiology reached out this morning, concern for small PE noted on CTA abdomen/pelvis.  Holding off on full CTA chest at this time due to concern with contrast/renal function but will plan for heparin. . 10/10: Renal function okay. CT w/ contrast lumbar spine - no osteomyelitis noted. CTA chest no PE but Significant mucus within RIGHT mainstem bronchus extending into bronchus intermedius and RIGHT lower lobe. Continue IV fluids and recheck BMP in AM. TEE planned for 10/12.  10/11-17: TEE done on 10/17 was negative for any endocarditis. Pulmonology was also consulted for concern of mucous plugging, has been resolved.6 renal function seems stable. Repeat blood cultures from 08/15/2022 remains negative, Unable to rule out epidural abscess or as still as MRI cannot be done due to pacemaker and wife refused to go to [facility] for MRI.  Will continue ampicillin for 6 weeks. Neurosurgery is recommending conservative management with brace.  Avoid NSAID due to CKD.  PT/OT are recommending SNF.  10/18: Infectious disease ordered WBC scan to rule out epidural abscess or discitis.  Continued to have lower back pain.  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 S/p repair on 08/16/2022 by vascular surgery. Patient had AAA repair done in 2018 by Dr. Lucky Cowboy. -Continue to monitor  Bacteremia due to Enterococcus Patient was found to have Enterococcus faecalis bacteremia, most likely secondary to UTI. TTE and TEE was negative for endocarditis.  Unable to rule out epidural abscess or discitis as unable to obtain MRI due to pacemaker.  Wife does not want him to go to [facility]. Repeat blood cultures done on 08/15/2022 remain negative. Infectious disease on board. ID ordered WBC scan today to rule out epidural abscess or discitis. -Continue with ampicillin, current plan is to continue for 6 weeks.  AAA (abdominal aortic aneurysm) without rupture (HCC)-resolved as of 08/25/2022 S/p of AAA repair 2018 by Dr. Lucky Cowboy. Now the size has enlarged from 6.9 to 7.6 cm -consulted Dr. Lorenso Courier of [facility]  Addendum: Per Dr. Lorenso Courier, patient likely need surgical intervention. Pt will need CTA as soon as possible.  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 Candiss Norse.  He recommended to treat patient with IV fluid for few hours before doing CTA.  Patient is started on NS at 75/h. Will do CTA at three hours (14:00) after staring IVF. Pt will need to continue IVF until tomorrow AM after CTA is done.  -CTA of abd/pelvis is order (will be done at 14:00) -continue NS at 75cc/h -will hold [place]    Low back pain Lumbar OA, DDD Multiple lobar spine hardware This seems to be a chronic issue, has worsened recently. CT scan showed  chronic L5-S1 fusion hardware, with chronic L5 spondylolysis and grade 2 L5-S1 spondylolisthesis, unchanged. There is osteopenia degenerative change of the lumbar spine with discogenic mild grade 1 retrolisthesis unchanged at L1-2 and L2-3, slight dextroscoliosis. Unable to obtain MRI due to pacemaker. Neurosurgery is recommending conservative management with brace and outpatient follow-up. -pain control: As needed Percocet, Tylenol -As needed Robaxin -lidoderm  COPD exacerbation ([place]) Concern of some COPD exacerbation on admission with some wheezing which has been resolved.  No infiltrate on imaging but there was a concern of mucous plugging. Procalcitonin was elevated at 0.39. Patient received a course of steroids and antibiotics. Currently stable on room air. -Continue to monitor   Aneurysm of right common iliac artery (HCC)-resolved as of 08/25/2022 -f/u with [facility]  Mucus plugging of bronchi SOB w/ mucus plugging of bronchi noted on CT PE resolved on CTA (recently diagnosed) Pulmonary was consulted and patient received MetaNeb with albuterol.  They also recommend incentive spirometry and bronchopulmonary hygiene.  Respiratory status improved. -Continue to monitor   Chronic kidney disease, stage IV (severe) (HCC) AKI improved.  Creatinine currently at baseline. -Continue to monitor -Avoid nephrotoxins  Myocardial injury-resolved as of 08/25/2022 -see above  Essential hypertension Blood pressure mildly elevated. Home Cozaar was switched with amlodipine. -Continue with  amlodipine and metoprolol -Continue with as needed hydralazine  Chronic diastolic CHF (congestive heart failure) ([place]) 2D echo on 06/14/2022 showed EF of 60 to 65% with grade 1 diastolic dysfunction.  Patient does not have leg edema.  No pulm edema chest x-ray.  CHF seem to be compensated. -Continue to monitor  CAD S/P CABG x 3 Hx of CAD s/p of CABG and myocardial injury. Patient had mildly positive troponin  with a flat curve most likely secondary to demand ischemia.  No chest pain. -Continue home Lipitor and Zetia.  HLD (hyperlipidemia) -Continue to Lipitor and zetia  Iron deficiency anemia Hemoglobin slowly trending down.  No active bleeding. 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 -s/p of RRL lobectomy and radiation therapy.  No acute issues.  Anxiety - Xanax 0.25 mg bid PRN  Weakness PT is recommending SNF. -TOC consult for placement  Obesity (BMI 30-39.9)-resolved as of 08/25/2022  BMI= 30.68  and BW= 99.8 -Diet and exercise.   -Encourage to lose weight.    Subjective: Patient was seen and examined today.  Complaining of lower back pain stating that he could not sleep comfortably last night due to this pain.  Physical Exam: Vitals:   08/25/22 0413 08/25/22 0452 08/25/22 0809 08/25/22 1227  BP:  (!) 153/71 (!) 140/70 (!) 144/70  Pulse:  75 80 77  Resp:  18 18 18   Temp:  97.7 F (36.5 C) 98.7 F (37.1 C) 98.1 F (36.7 C)  TempSrc:      SpO2:  97% 97% 98%  Weight: 93 kg     Height:       General.  Frail elderly man, in no acute distress. Pulmonary.  Lungs clear bilaterally, normal respiratory effort. CV.  Regular rate and rhythm, no JVD, rub or murmur. Abdomen.  Soft, nontender, nondistended, BS positive. CNS.  Alert and oriented .  No focal neurologic deficit. Extremities.  No edema, no cyanosis, pulses intact and symmetrical. Psychiatry.  Appears to have some cognitive impairment.  Data Reviewed: Prior data reviewed  Family Communication: Called daughter with no response, voicemail left.  Disposition: Status is: Inpatient Remains inpatient appropriate because: Severity of illness   Planned Discharge Destination: Skilled  nursing facility  DVT prophylaxis.  Subcu heparin Time spent: 50 minutes  This record has been created using Systems analyst. Errors have been sought and corrected,but may not always be located. Such creation errors do not reflect on the standard of care.  Author: Lorella Nimrod, MD 08/25/2022 2:09 PM  For on call review [email or web address].

## 2022-08-25 NOTE — NC FL2 (Signed)
[place] LEVEL OF CARE SCREENING TOOL     IDENTIFICATION  Patient Name: Rick Mcbride. Birthdate: [birth year] Sex: male Admission Date (Current Location): 08/14/2022  [facility] and [place] Number:  Engineering geologist and Address:  [facility], [address] [place], [place] [address]      Provider Number: [id number]  Attending Physician Name and Address:  Lorella Nimrod, MD  Relative Name and Phone Number:       Current Level of Care: Hospital Recommended Level of Care: [place] Prior Approval Number:    Date Approved/Denied:   PASRR Number: [id number]  Discharge Plan: SNF    Current Diagnoses: Patient Active Problem List   Diagnosis Date Noted   Mucus plugging of bronchi 08/17/2022   Lumbar degenerative disc disease 08/17/2022   Arthritis of lumbar spine 08/17/2022   Acute pulmonary embolism ([place]) 08/16/2022   Bacteremia due to Enterococcus 08/15/2022   COPD exacerbation ([place]) 08/14/2022   HLD (hyperlipidemia)    TIA (transient ischemic attack)    Iron deficiency anemia    Anxiety    Chronic diastolic CHF (congestive heart failure) ([place]) 06/15/2022   Overweight (BMI 25.0-29.9) 06/10/2022   Presence of permanent cardiac pacemaker    Mild cognitive impairment    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[place]) 08/28/2021   Chronic pain syndrome 06/16/2021   Numbness of right foot 02/19/2021   Aortic atherosclerosis ([place]) 10/15/2020   Status post right shoulder hemiarthroplasty 01/25/2020   Anemia in chronic kidney disease 11/29/2019   Benign hypertensive kidney disease with chronic kidney disease 11/29/2019   Chronic kidney disease, stage IV (severe) ([place]) 11/29/2019   Secondary hyperparathyroidism of renal origin ([place]) 11/29/2019   Chronic right shoulder pain 11/12/2019   Thoracic aortic aneurysm without rupture ([place]) 10/09/2019   Weakness 06/07/2017   Olecranon fracture, left, closed,  initial encounter 05/27/2017   Spondylolisthesis of lumbosacral region 03/16/2017   Low back pain 02/01/2017   Escherichia coli (E. coli) infection 01/28/2017   Elevated troponin 01/28/2017   Generalized weakness 01/28/2017   Essential hypertension 01/28/2017   UTI (urinary tract infection) 01/26/2017   Chronic obstructive pulmonary disease ([place]) 10/09/2015   Personal history of diseases of skin or subcutaneous tissue 10/09/2015   Malignant neoplasm of prostate ([place]) 10/09/2015   Pure hypercholesterolemia 10/09/2015   Sinoatrial node dysfunction ([place]) 10/09/2015   History of surgical procedure 08/22/2015   Status post total shoulder replacement 07/10/2015   Arthritis of shoulder region, degenerative 06/10/2015   Benign essential HTN 06/03/2015   Essential (primary) hypertension 04/12/2015   Acid reflux 04/12/2015   Cancer of lung ([place]) 04/12/2015   CA of prostate ([place]) 04/12/2015   H/O adenomatous polyp of colon 01/15/2015   Temporary cerebral vascular dysfunction 11/21/2014   Calcific shoulder tendinitis 08/01/2014   Cervical nerve root disorder 04/07/2012   CAD S/P CABG x 3 05/01/2004    Orientation RESPIRATION BLADDER Height & Weight     Self, Time, Situation, Place  Normal Incontinent, External catheter Weight: 205 lb 0.4 oz (93 kg) Height:  5\' 11"  (180.3 cm)  BEHAVIORAL SYMPTOMS/MOOD NEUROLOGICAL BOWEL NUTRITION STATUS   (None)  (None) Incontinent Diet (Heart healthy)  AMBULATORY STATUS COMMUNICATION OF NEEDS Skin   Extensive Assist Verbally Bruising                       Personal Care Assistance Level of Assistance  Bathing, Feeding, Dressing Bathing Assistance: Maximum assistance  Feeding assistance: Limited assistance Dressing Assistance: Maximum assistance     Functional Limitations Info  Sight, Hearing, Speech Sight Info: Adequate Hearing Info: Impaired Speech Info: Adequate    SPECIAL CARE FACTORS FREQUENCY  PT (By licensed PT), OT (By licensed OT)      PT Frequency: 5 x week OT Frequency: 5 x week            Contractures Contractures Info: Not present    Additional Factors Info  Code Status, Allergies Code Status Info: DNR Allergies Info: NKDA           Current Medications (08/25/2022):  This is the current hospital active medication list Current Facility-Administered Medications  Medication Dose Route Frequency Provider Last Rate Last Admin   acetaminophen (TYLENOL) tablet 1,000 mg  1,000 mg Oral Q6H PRN Sharion Settler, NP   1,000 mg at 08/21/22 0148   albuterol (PROVENTIL) (2.5 MG/3ML) 0.083% nebulizer solution 2.5 mg  2.5 mg Nebulization Q4H PRN Algernon Huxley, MD       amLODipine (NORVASC) tablet 10 mg  10 mg Oral Daily Nolberto Hanlon, MD   10 mg at 08/25/22 0829   ampicillin (OMNIPEN) 2 g in sodium chloride 0.9 % 100 mL IVPB  2 g Intravenous Q6H Coulter, Carolyn, RPH 300 mL/hr at 08/25/22 1043 2 g at 08/25/22 1043   atorvastatin (LIPITOR) tablet 40 mg  40 mg Oral QHS Algernon Huxley, MD   40 mg at 08/24/22 2222   calcitRIOL (ROCALTROL) capsule 0.25 mcg  0.25 mcg Oral q AM Algernon Huxley, MD   0.25 mcg at 08/25/22 3007   chlorproMAZINE (THORAZINE) tablet 25 mg  25 mg Oral TID PRN Nolberto Hanlon, MD   25 mg at 08/23/22 2124   clopidogrel (PLAVIX) tablet 75 mg  75 mg Oral Daily Algernon Huxley, MD   75 mg at 08/25/22 0829   dextromethorphan-guaiFENesin ([place] DM) 30-600 MG per 12 hr tablet 1 tablet  1 tablet Oral BID PRN Algernon Huxley, MD   1 tablet at 08/23/22 2122   donepezil (ARICEPT) tablet 10 mg  10 mg Oral QHS Algernon Huxley, MD   10 mg at 08/24/22 2222   ezetimibe (ZETIA) tablet 10 mg  10 mg Oral QPM Algernon Huxley, MD   10 mg at 08/24/22 2222   ferrous sulfate tablet 325 mg  325 mg Oral Q2000 Algernon Huxley, MD   325 mg at 08/24/22 2222   guaiFENesin (MUCINEX) 12 hr tablet 600 mg  600 mg Oral BID Nolberto Hanlon, MD   600 mg at 08/25/22 0829   heparin injection 5,000 Units  5,000 Units Subcutaneous Q8H Emeterio Reeve, DO   5,000  Units at 08/25/22 1344   hydrALAZINE (APRESOLINE) injection 5 mg  5 mg Intravenous Q2H PRN Algernon Huxley, MD   5 mg at 08/21/22 6226   HYDROmorphone (DILAUDID) injection 1 mg  1 mg Intravenous Q2H PRN Emeterio Reeve, DO   1 mg at 08/25/22 1442   ipratropium (ATROVENT) 0.06 % nasal spray 2-4 spray  2-4 spray Each Nare QHS Algernon Huxley, MD   2 spray at 08/24/22 2227   lidocaine (LIDODERM) 5 % 1 patch  1 patch Transdermal Q24H Algernon Huxley, MD   1 patch at 08/25/22 0828   loratadine (CLARITIN) tablet 10 mg  10 mg Oral Daily Algernon Huxley, MD   10 mg at 08/24/22 2222   melatonin tablet 10 mg  10 mg Oral QHS Dew,  Erskine Squibb, MD   10 mg at 08/24/22 2222   methocarbamol (ROBAXIN) tablet 500 mg  500 mg Oral TID Nolberto Hanlon, MD   500 mg at 08/25/22 5170   metoprolol succinate (TOPROL-XL) 24 hr tablet 25 mg  25 mg Oral Daily Algernon Huxley, MD   25 mg at 08/25/22 0174   multivitamin with minerals tablet 1 tablet  1 tablet Oral QPC lunch Algernon Huxley, MD   1 tablet at 08/25/22 1344   ondansetron (ZOFRAN) injection 4 mg  4 mg Intravenous Q8H PRN Algernon Huxley, MD       oxyCODONE-acetaminophen (PERCOCET/ROXICET) 5-325 MG per tablet 2 tablet  2 tablet Oral Q6H Emeterio Reeve, DO   2 tablet at 08/25/22 1344   pantoprazole (PROTONIX) EC tablet 40 mg  40 mg Oral QAC breakfast Algernon Huxley, MD   40 mg at 08/25/22 0828   polyethylene glycol (MIRALAX / GLYCOLAX) packet 17 g  17 g Oral BID Emeterio Reeve, DO   17 g at 08/25/22 9449   senna-docusate (Senokot-S) tablet 2 tablet  2 tablet Oral BID Emeterio Reeve, DO   2 tablet at 08/25/22 6759   zolpidem (AMBIEN) tablet 10 mg  10 mg Oral QHS Algernon Huxley, MD   10 mg at 08/24/22 2222     Discharge Medications: Please see discharge summary for a list of discharge medications.  Relevant Imaging Results:  Relevant Lab Results:   Additional Information SS#: [id number]  Candie Chroman, LCSW

## 2022-08-25 NOTE — Progress Notes (Signed)
[place], [place] 08/25/22  Subjective:   Hospital day # 11  Patient known to our practice from outpatient follow-up of CKD, with Dr Candiss Norse.  Patient was seen today on second floor. Patient wife was present in the room. Patient was resting  in the bed      10/17 0701 - 10/18 0700 In: 1400 [P.O.:600; IV Piggyback:800] Out: 2600 [Urine:2600] Lab Results  Component Value Date   CREATININE 1.88 (H) 08/24/2022   CREATININE 1.99 (H) 08/22/2022   CREATININE 2.05 (H) 08/20/2022     Objective:  Vital signs in last 24 hours:  Temp:  [97.7 F (36.5 C)-98.7 F (37.1 C)] 98.5 F (36.9 C) (10/18 1649) Pulse Rate:  [74-81] 81 (10/18 1649) Resp:  [16-19] 16 (10/18 1800) BP: (137-163)/(62-75) 137/71 (10/18 1649) SpO2:  [94 %-100 %] 100 % (10/18 1649) Weight:  [93 kg] 93 kg (10/18 0413)  Weight change:  Filed Weights   08/19/22 0748 08/23/22 0517 08/25/22 0413  Weight: 93.4 kg 89.9 kg 93 kg    Intake/Output:    Intake/Output Summary (Last 24 hours) at 08/25/2022 1914 Last data filed at 08/25/2022 1500 Gross per 24 hour  Intake 540 ml  Output 1500 ml  Net -960 ml     Physical Exam: General: elderly gentleman, laying in the bed  HEENT Anicteric, moist oral mucous membranes  Pulm/lungs Normal breathing effort, clear to auscultation  CVS/Heart No rub or gallop  Abdomen:  Soft, nontender  Extremities: No peripheral edema  Neurologic: Alert, oriented, pain with movement of right leg  Skin: No acute rashes          Basic Metabolic Panel:  Recent Labs  Lab 08/20/22 0606 08/22/22 0519 08/24/22 0604  NA  --  138 138  K  --  3.5 3.7  CL  --  109 108  CO2  --  21* 23  GLUCOSE  --  117* 101*  BUN  --  32* 35*  CREATININE 2.05* 1.99* 1.88*  CALCIUM  --  8.4* 8.6*  PHOS  --  3.6  --      CBC: Recent Labs  Lab 08/22/22 0519 08/24/22 0604  WBC 10.6* 8.7  HGB 7.9* 7.2*  HCT 25.0* 23.8*  MCV 88.7 89.1  PLT 221 217     No  results found for: "HEPBSAG", "HEPBSAB", "HEPBIGM"    Microbiology:  No results found for this or any previous visit (from the past 240 hour(s)).   Coagulation Studies: No results for input(s): "LABPROT", "INR" in the last 72 hours.  Urinalysis: No results for input(s): "COLORURINE", "LABSPEC", "PHURINE", "GLUCOSEU", "HGBUR", "BILIRUBINUR", "KETONESUR", "PROTEINUR", "UROBILINOGEN", "NITRITE", "LEUKOCYTESUR" in the last 72 hours.  Invalid input(s): "APPERANCEUR"     Imaging: No results found.   Medications:    ampicillin (OMNIPEN) IV 2 g (08/25/22 1629)    amLODipine  10 mg Oral Daily   atorvastatin  40 mg Oral QHS   calcitRIOL  0.25 mcg Oral q AM   clopidogrel  75 mg Oral Daily   donepezil  10 mg Oral QHS   ezetimibe  10 mg Oral QPM   ferrous sulfate  325 mg Oral Q2000   guaiFENesin  600 mg Oral BID   heparin injection (subcutaneous)  5,000 Units Subcutaneous Q8H   ipratropium  2-4 spray Each Nare QHS   lidocaine  1 patch Transdermal Q24H   loratadine  10 mg Oral Daily   melatonin  10 mg Oral QHS   methocarbamol  500 mg Oral TID   metoprolol succinate  25 mg Oral Daily   multivitamin with minerals  1 tablet Oral QPC lunch   oxyCODONE-acetaminophen  2 tablet Oral Q6H   pantoprazole  40 mg Oral QAC breakfast   polyethylene glycol  17 g Oral BID   senna-docusate  2 tablet Oral BID   zolpidem  10 mg Oral QHS   acetaminophen, albuterol, chlorproMAZINE, dextromethorphan-guaiFENesin, hydrALAZINE, HYDROmorphone (DILAUDID) injection, ondansetron (ZOFRAN) IV  Assessment/ Plan:  81 y.o. 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, history of stroke   admitted on 08/14/2022 for SOB (shortness of breath) [R06.02] Bronchitis [J40] Elevated troponin [R79.89] COPD exacerbation (HCC)  [J44.1] CAP (community acquired pneumonia) [J18.9] Generalized weakness [R53.1] AKI (acute kidney injury) ([place]) [N17.9] Acute midline low back pain without sciatica [M54.50]   Impression 1-Acute kidney injury Patient had AKI most likely secondary to contrast-induced nephropathy Patient peak creatinine was at 2.7 on October 7 now it has come down to his baseline of around 1.9--2.0  2-CKD stage 3b/ IV Patient has CKD most likely secondary to hypertension With some contribution from age associated  decline as patient is 81 years old Kidney function is at baseline.  Urinalysis shows small proteinuria.     3-Anemia of chronic disease Patient hemoglobin is low No need for transfusion for now   4-E faecalis bacteremia Patient is being followed by ID  5-Diastolic CHF   Patient is well compensated   2D echo from 06/14/2022 shows LVEF 60 to 27%, grade 1 diastolic dysfunction  6-Chronic metabolic acidosis  patient bicarb is at now better  no need for p.o. bicarb for now   Plan: Creatinine stable.    Primary team to continue IV antibiotics.       LOS: [place] 10/18/20237:14 PM  [place], [place]  Note: This note was prepared with Dragon dictation. Any transcription errors are unintentional

## 2022-08-25 NOTE — Progress Notes (Signed)
OT Cancellation Note  Patient Details Name: Rick Mcbride. MRN: [id number] DOB: 01-Nov-1941   Cancelled Treatment:    Reason Eval/Treat Not Completed: Pain limiting ability to participate; spouse requests no tx this morning.  Will attempt later as time allows.  Leta Speller, MS, OTR/L  Darleene Cleaver 08/25/2022, 8:43 AM

## 2022-08-25 NOTE — Progress Notes (Signed)
ID Still in lot of back pain In bed Eating in bed lying flat   O/e Awake and alert Patient Vitals for the past 24 hrs:  BP Temp Temp src Pulse Resp SpO2 Weight  08/25/22 1227 (!) 144/70 98.1 F (36.7 C) -- 77 18 98 % --  08/25/22 0809 (!) 140/70 98.7 F (37.1 C) -- 80 18 97 % --  08/25/22 0452 (!) 153/71 97.7 F (36.5 C) -- 75 18 97 % --  08/25/22 0413 -- -- -- -- -- -- 93 kg  08/25/22 0405 (!) 161/75 97.8 F (36.6 C) -- 76 19 98 % --  08/24/22 2338 (!) 144/62 98.3 F (36.8 C) -- 74 19 100 % --  08/24/22 2047 (!) 163/75 97.7 F (36.5 C) -- 79 18 94 % --  08/24/22 1715 129/72 98.5 F (36.9 C) Oral 86 19 97 % --   Chest b/l air entry Few crepts base [id number] Pacemaker Abd soft CNS moves all extremities  Labs    Latest Ref Rng & Units 08/24/2022    6:04 AM 08/22/2022    5:19 AM 08/18/2022    4:06 AM  CBC  WBC 4.0 - 10.5 K/uL 8.7  10.6  7.2   Hemoglobin 13.0 - 17.0 g/dL 7.2  7.9  9.4   Hematocrit 39.0 - 52.0 % 23.8  25.0  30.0   Platelets 150 - 400 K/uL 217  221  193        Latest Ref Rng & Units 08/24/2022    6:04 AM 08/22/2022    5:19 AM 08/20/2022    6:06 AM  CMP  Glucose 70 - 99 mg/dL 101  117    BUN 8 - 23 mg/dL 35  32    Creatinine 0.61 - 1.24 mg/dL 1.88  1.99  2.05   Sodium 135 - 145 mmol/L 138  138    Potassium 3.5 - 5.1 mmol/L 3.7  3.5    Chloride 98 - 111 mmol/L 108  109    CO2 22 - 32 mmol/L 23  21    Calcium 8.9 - 10.3 mg/dL 8.6  8.4      Micro BC- 08/14/22 Enterococcis UC - enterococcus 08/15/22 BC-NG   Impression/recommendation  Severe Low back pain- has previous L5-s1 fusion  discitis. Epidural abscess or osteo cannot be ruled out as MRI cannot be done due to pacemaker Pt will get tagged WBC scan tomorrow ( PET scan cannot be done as no machine [facility])  Enterococcus bacteremia-  Concern for endocarditis due to pacemaker   TEE neg for endocarditis UC positive for enterococcus  AAA- s/p endovascular stent- worsening aneurysm with endo  leak 2. 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

## 2022-08-25 NOTE — Assessment & Plan Note (Signed)
SOB w/ mucus plugging of bronchi noted on CT PE resolved on CTA (recently diagnosed) Pulmonary was consulted and patient received MetaNeb with albuterol.  They also recommend incentive spirometry and bronchopulmonary hygiene.  Respiratory status improved. -Continue to monitor

## 2022-08-25 NOTE — Assessment & Plan Note (Signed)
Patient was found to have Enterococcus faecalis bacteremia, most likely secondary to UTI. TTE and TEE was negative for endocarditis.  Unable to rule out epidural abscess or discitis as unable to obtain MRI due to pacemaker.  Wife does not want him to go to [facility]. Repeat blood cultures done on 08/15/2022 remain negative. Infectious disease on board. ID ordered WBC scan today to rule out epidural abscess or discitis. -Continue with ampicillin, current plan is to continue for 6 weeks.

## 2022-08-25 NOTE — Assessment & Plan Note (Signed)
PT is recommending SNF. -TOC consult for placement

## 2022-08-25 NOTE — TOC Initial Note (Signed)
Transition of Care (TOC) - Initial/Assessment Note    Patient Details  Name: Rick Mcbride. MRN: [id number] Date of Birth: 1941-04-09  Transition of Care [facility]) CM/SW Contact:    Candie Chroman, LCSW Phone Number: 08/25/2022, 3:37 PM  Clinical Narrative:  CSW met with patient. Wife, daughter, and son-in-law at bedside. CSW introduced role and explained that therapy recommendations would be discussed. They are agreeable to SNF placement. First preference is [facility]. Admissions coordinator is aware and will review. Patient was at [facility] 8/8-9/14 (37 days) but wife said he has met his out-of-pocket max so should hopefully not have a copay. Family asked about long-term care vs personal care services after rehab. They are agreeable to [facility] referral. Left message for liaison. Patient is a English as a second language teacher. Daughter is HCPOA. No further concerns. CSW encouraged patient and his family to contact CSW as needed. CSW will continue to follow patient and his family for support and facilitate discharge to SNF once medically stable.                Expected Discharge Plan: Skilled Nursing Facility Barriers to Discharge: Continued Medical Work up   Patient Goals and CMS Choice   CMS Medicare.gov Compare Post Acute Care list provided to:: Patient Represenative (must comment) (Wife and daughter)    Expected Discharge Plan and Services Expected Discharge Plan: [place] Acute Care Choice: [place] arrangements for the past 2 months: Single Family Home                                      Prior Living Arrangements/Services Living arrangements for the past 2 months: Single Family Home Lives with:: Spouse Patient language and need for interpreter reviewed:: Yes Do you feel safe going back to the place where you live?: Yes      Need for Family Participation in Patient Care: Yes (Comment) Care giver support system in place?: Yes  (comment)   Criminal Activity/Legal Involvement Pertinent to Current Situation/Hospitalization: No - Comment as needed  Activities of Daily Living Home Assistive Devices/Equipment: Environmental consultant (specify type), Wheelchair ADL Screening (condition at time of admission) Patient's cognitive ability adequate to safely complete daily activities?: Yes Is the patient deaf or have difficulty hearing?: Yes Does the patient have difficulty seeing, even when wearing glasses/contacts?: No Does the patient have difficulty concentrating, remembering, or making decisions?: Yes Patient able to express need for assistance with ADLs?: Yes Does the patient have difficulty dressing or bathing?: Yes Independently performs ADLs?: No Communication: Independent Dressing (OT): Needs assistance Is this a change from baseline?: Change from baseline, expected to last >3 days Grooming: Needs assistance Is this a change from baseline?: Change from baseline, expected to last >3 days Feeding: Independent Bathing: Needs assistance Is this a change from baseline?: Change from baseline, expected to last >3 days Toileting: Needs assistance Is this a change from baseline?: Change from baseline, expected to last >3days In/Out Bed: Needs assistance Is this a change from baseline?: Change from baseline, expected to last >3 days Does the patient have difficulty walking or climbing stairs?: Yes Weakness of Legs: Both Weakness of Arms/Hands: None  Permission Sought/Granted Permission sought to share information with : Facility Sport and exercise psychologist, Family Supports    Share Information with NAME: Kaulder Zahner and Henderson Newcomer  Permission granted to share info w AGENCY: SNF's  Permission  granted to share info w Relationship: Wife and daughter  Permission granted to share info w Contact Information: [name]: [phone]: [phone]  Emotional Assessment Appearance:: Appears stated age Attitude/Demeanor/Rapport:  Engaged Affect (typically observed): Accepting, Appropriate, Calm, Pleasant Orientation: : Oriented to Self, Oriented to Place, Oriented to  Time, Oriented to Situation Alcohol / Substance Use: Not Applicable Psych Involvement: No (comment)  Admission diagnosis:  SOB (shortness of breath) [R06.02] Bronchitis [J40] Elevated troponin [R79.89] COPD exacerbation (HCC) [J44.1] CAP (community acquired pneumonia) [J18.9] Generalized weakness [R53.1] AKI (acute kidney injury) ([place]) [N17.9] Acute midline low back pain without sciatica [M54.50] Patient Active Problem List   Diagnosis Date Noted   Mucus plugging of bronchi 08/17/2022   Lumbar degenerative disc disease 08/17/2022   Arthritis of lumbar spine 08/17/2022   Acute pulmonary embolism ([place]) 08/16/2022   Bacteremia due to Enterococcus 08/15/2022   COPD exacerbation ([place]) 08/14/2022   HLD (hyperlipidemia)    TIA (transient ischemic attack)    Iron deficiency anemia    Anxiety    Chronic diastolic CHF (congestive heart failure) ([place]) 06/15/2022   Overweight (BMI 25.0-29.9) 06/10/2022   Presence of permanent cardiac pacemaker    Mild cognitive impairment    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) 11/29/2019   Chronic right shoulder pain 11/12/2019   Thoracic aortic aneurysm without rupture ([place]) 10/09/2019   Weakness 06/07/2017   Olecranon fracture, left, closed, initial encounter 05/27/2017   Spondylolisthesis of lumbosacral region 03/16/2017   Low back pain 02/01/2017   Escherichia coli (E. coli)  infection 01/28/2017   Elevated troponin 01/28/2017   Generalized weakness 01/28/2017   Essential hypertension 01/28/2017   UTI (urinary tract infection) 01/26/2017   Chronic obstructive pulmonary disease ([place]) 10/09/2015   Personal history of diseases of skin or subcutaneous tissue 10/09/2015   Malignant neoplasm of prostate ([place]) 10/09/2015   Pure hypercholesterolemia 10/09/2015   Sinoatrial node dysfunction ([place]) 10/09/2015   History of surgical procedure 08/22/2015   Status post total shoulder replacement 07/10/2015   Arthritis of shoulder region, degenerative 06/10/2015   Benign essential HTN 06/03/2015   Essential (primary) hypertension 04/12/2015   Acid reflux 04/12/2015   Cancer of lung ([place]) 04/12/2015   CA of prostate ([place]) 04/12/2015   H/O adenomatous polyp of colon 01/15/2015   Temporary cerebral vascular dysfunction 11/21/2014   Calcific shoulder tendinitis 08/01/2014   Cervical nerve root disorder 04/07/2012   CAD S/P CABG x 3 05/01/2004   PCP:  Idelle Crouch, MD Pharmacy:   [place], [place] [address] [place] [address] Phone: [phone] Fax: [phone]     Social Determinants of Health (SDOH) Interventions    Readmission Risk Interventions    06/13/2022    4:04 PM  Readmission Risk Prevention Plan  Transportation Screening Complete  PCP or Specialist Appt within 3-5 Days Complete  HRI or Home Care Consult Complete  Social Work Consult for Recovery Care Planning/Counseling Complete  Palliative Care Screening Not Applicable  Medication Review Press photographer) Complete

## 2022-08-26 ENCOUNTER — Encounter: Payer: Self-pay | Admitting: Internal Medicine

## 2022-08-26 ENCOUNTER — Inpatient Hospital Stay: Payer: PPO

## 2022-08-26 DIAGNOSIS — L8990 Pressure ulcer of unspecified site, unspecified stage: Secondary | ICD-10-CM | POA: Insufficient documentation

## 2022-08-26 DIAGNOSIS — R7881 Bacteremia: Secondary | ICD-10-CM | POA: Diagnosis not present

## 2022-08-26 DIAGNOSIS — M545 Low back pain, unspecified: Secondary | ICD-10-CM | POA: Diagnosis not present

## 2022-08-26 DIAGNOSIS — B952 Enterococcus as the cause of diseases classified elsewhere: Secondary | ICD-10-CM | POA: Diagnosis not present

## 2022-08-26 LAB — CBC
HCT: 26.6 % — ABNORMAL LOW (ref 39.0–52.0)
Hemoglobin: 8.2 g/dL — ABNORMAL LOW (ref 13.0–17.0)
MCH: 27.2 pg (ref 26.0–34.0)
MCHC: 30.8 g/dL (ref 30.0–36.0)
MCV: 88.4 fL (ref 80.0–100.0)
Platelets: 275 10*3/uL (ref 150–400)
RBC: 3.01 MIL/uL — ABNORMAL LOW (ref 4.22–5.81)
RDW: 16 % — ABNORMAL HIGH (ref 11.5–15.5)
WBC: 8.2 10*3/uL (ref 4.0–10.5)
nRBC: 0 % (ref 0.0–0.2)

## 2022-08-26 LAB — RENAL FUNCTION PANEL
Albumin: 2.2 g/dL — ABNORMAL LOW (ref 3.5–5.0)
Anion gap: 9 (ref 5–15)
BUN: 28 mg/dL — ABNORMAL HIGH (ref 8–23)
CO2: 22 mmol/L (ref 22–32)
Calcium: 8.4 mg/dL — ABNORMAL LOW (ref 8.9–10.3)
Chloride: 104 mmol/L (ref 98–111)
Creatinine, Ser: 1.85 mg/dL — ABNORMAL HIGH (ref 0.61–1.24)
GFR, Estimated: 36 mL/min — ABNORMAL LOW (ref >60)
Glucose, Bld: 87 mg/dL (ref 70–99)
Phosphorus: 3.3 mg/dL (ref 2.5–4.6)
Potassium: 3.6 mmol/L (ref 3.5–5.1)
Sodium: 135 mmol/L (ref 135–145)

## 2022-08-26 MED ORDER — TECHNETIUM TC 99M EXAMETAZIME IV KIT
6.9500 | PACK | Freq: Once | INTRAVENOUS | Status: AC | PRN
  Administered 2022-08-26: 6.95 via INTRAVENOUS

## 2022-08-26 NOTE — Assessment & Plan Note (Signed)
Lumbar OA, DDD Multiple lobar spine hardware This seems to be a chronic issue, has worsened recently. 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 There is osteopenia degenerative change of the lumbar spine with discogenic mild grade 1 retrolisthesis unchanged at L1-2 and L2-3, slight dextroscoliosis. Unable to obtain MRI due to pacemaker. Neurosurgery is recommending conservative management with brace and outpatient follow-up. -pain control: As needed Percocet, Tylenol -As needed Robaxin -lidoderm

## 2022-08-26 NOTE — Progress Notes (Signed)
Noted 6 small wounds on pt's scrotum when changing male purewick, notified primary RN. We switched purewick to condom cath and applied gauze and xeroform to area

## 2022-08-26 NOTE — Care Management Important Message (Signed)
Important Message  Patient Details  Name: Rick Mcbride. MRN: [id number] Date of Birth: 26-Jun-1941   Medicare Important Message Given:  Yes     Dannette Barbara 08/26/2022, 3:33 PM

## 2022-08-26 NOTE — Progress Notes (Signed)
Progress Note   Patient: Rick Mcbride. EVO:[id number] DOB: 16-Feb-1941 DOA: 08/14/2022     12 DOS: the patient was seen and examined on 08/26/2022   Brief hospital course: Rick Mcbride. is a 81 y.o. 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 Patient had surgery and finished SNF rehab. Currently he is doing PT/OT at home), chronic lower back pain (L5-S1 fusion), who presents with lower back pain and shortness breath. Pt had negative PE by V/Q scan which is ordered by his PCP day PTA, which was done for persistent SOB postoperatively. 10/07: WBC 3.8, negative COVID PCR, lactic acid 0.9, troponin level 31, 30, slightly worsening renal function with creatinine 2.69, BUN 31, GFR 23 (recent baseline creatinine 2.39 06/15/2022), temperature normal, blood pressure 172/84, 129/64, RR 83, heart rate 83, RR 17, oxygen saturation 98% on room air.  Chest x-ray showed possible mild patchy infiltration in left lower lobe.  CT head negative.  CT scan showed enlargement of AAA from previous 6.9 to 7.6 cm now.  Patient is admitted to telemetry bed as inpatient. Dr. Lorenso Courier of [facility] was consulted re: AAA, needs CTA, nephrology consulted and recommended IV fluids prior to CTA of Abd/Pelvis. Cardiology also consulted for surgical optimization/risk stratification - medically optimized at this time, troponin mild elevation likely d/t CKD4. BCx (+) E Faecalis, ID recommended r/o endocarditis/pacer infection w/ TEE and vascular surgery to assess re: graft involvement, changed Abx to ampicillin and repeat Bcx, also recommend MRI L-spine or CT  w/ contrast, pt has pacemaker   10/08: Pt and wife agreeable to TEE and MRI, though MRI would have to be at [facility]. Per vascular: "Noted bacteremia- however, patient with chronic progressive lower back pain; expanding aortic sac may be contributory to pain- not definitive. Therefore, preferable to intervene sooner than later." Per ID, would consider in order of importance to 1) treat bacteremia, 2) address aorto-bifem leak, 3) TEE, 4) MRI/CT L-spine. IF we decide to do CT, this can be done in-house, vs RI would require transfer. Will plan to proceed w/ IV abx, do vascular procedure tomorrow as scheduled, and see how he progresses from there.  10/09: Vascular procedure today to fix graft.  Radiology reached out this morning, concern for small PE noted on CTA abdomen/pelvis.  Holding off on full CTA chest at this time due to concern with contrast/renal function but will plan for heparin. 10/10: Renal function okay. CT w/ contrast lumbar spine - no osteomyelitis noted. CTA chest no PE but Significant mucus within RIGHT mainstem bronchus extending into bronchus intermedius and RIGHT lower lobe. Continue IV fluids and recheck BMP in AM. TEE planned for 10/12.  10/11-17: TEE done on 10/17 was negative for any endocarditis. Pulmonology was also consulted for concern of mucous plugging, has been resolved.6 renal function seems stable. Repeat blood cultures from 08/15/2022 remains negative, Unable to rule out epidural abscess or as still as MRI cannot be done due to pacemaker and wife refused to go to [facility] for MRI.  Will continue ampicillin for 6 weeks. Neurosurgery is recommending conservative management with brace.  Avoid NSAID due to CKD. PT/OT are recommending SNF.  10/18: Infectious disease ordered WBC scan to rule out epidural abscess or discitis.  Continued to have lower back pain.  10/19: Continue to have lower back pain.  Scheduled for WBC scan today.  Pending SNF placement-no bed offer  yet  Consultants:  Vascular surgery - TAA, AAA enlarged, R common iliac aneurysm Nephrology - CKD4 needing contrast study Cardiology - clearance for vascular procedure given CAD/CABG, Chronic HFpEF ID - (+)Bcx  Procedures: 08/16/2022: Repair of aortic aneurysm sac type II endoleak       Assessment and Plan: * Thoracic aortic aneurysm without rupture (HCC) Type II aortic aneurysm endoleak AAA (abdominal aortic aneurysm) without rupture (HCC) Thoracic aortic aneurysm without rupture ([employer]) Aneurysm of right common iliac artery ([place]). S/p repair on 08/16/2022 by vascular surgery. Patient had AAA repair done in 2018 by Dr. Lucky Cowboy. -Continue to monitor  Bacteremia due to Enterococcus Patient was found to have Enterococcus faecalis bacteremia, most likely secondary to UTI. TTE and TEE was negative for endocarditis.  Unable to rule out epidural abscess or discitis as unable to obtain MRI due to pacemaker.  Wife does not want him to go to [facility]. Repeat blood cultures done on 08/15/2022 remain negative. Infectious disease on board.  WBC scan today to rule out epidural abscess or discitis. -Continue with ampicillin, current plan is to continue for 6 weeks.  AAA (abdominal aortic aneurysm) without rupture (HCC)-resolved as of 08/25/2022 S/p of AAA repair 2018 by Dr. Lucky Cowboy. Now the size has enlarged from 6.9 to 7.6 cm -consulted Dr. Lorenso Courier of [facility]  Addendum: Per Dr. Lorenso Courier, patient likely need surgical intervention. Pt will need CTA as soon as possible.  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 Candiss Norse.  He recommended to treat patient with IV fluid for few hours before doing CTA.  Patient is started on NS at 75/h. Will do CTA at three hours (14:00) after staring IVF. Pt will need to continue IVF until tomorrow AM after CTA is done.  -CTA of abd/pelvis is order (will be done at 14:00) -continue NS at 75cc/h -will hold [place]    Low back pain Lumbar OA, DDD Multiple lobar  spine hardware This seems to be a chronic issue, has worsened recently. 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 There is osteopenia degenerative change of the lumbar spine with discogenic mild grade 1 retrolisthesis unchanged at L1-2 and L2-3, slight dextroscoliosis. Unable to obtain MRI due to pacemaker. Neurosurgery is recommending conservative management with brace and outpatient follow-up. -pain control: As needed Percocet, Tylenol -As needed Robaxin -lidoderm  COPD exacerbation ([place]) Concern of some COPD exacerbation on admission with some wheezing which has been resolved.  No infiltrate on imaging but there was a concern of mucous plugging. Procalcitonin was elevated at 0.39. Patient received a course of steroids and antibiotics. Currently stable on room air. -Continue to monitor   Aneurysm of right common iliac artery (HCC)-resolved as of 08/25/2022 -f/u with [facility]  Mucus plugging of bronchi SOB w/ mucus plugging of bronchi noted on CT PE resolved on CTA (recently diagnosed) Pulmonary was consulted and patient received MetaNeb with albuterol.  They also recommend incentive spirometry and bronchopulmonary hygiene.  Respiratory status improved. -Continue to monitor   Chronic kidney disease, stage IV (severe) (HCC) AKI improved.  Creatinine currently at baseline. -Continue to monitor -Avoid nephrotoxins  Myocardial injury-resolved as of 08/25/2022 -see above  Essential hypertension Blood pressure mildly elevated. Home  Cozaar was switched with amlodipine. -Continue with amlodipine and metoprolol -Continue with as needed hydralazine  Chronic diastolic CHF (congestive heart failure) (HCC) 2D echo on 06/14/2022 showed EF of 60 to 65% with grade 1 diastolic dysfunction.  Patient does not have leg edema.  No pulm edema chest x-ray.  CHF seem to be compensated. -Continue to monitor  CAD S/P CABG x  3 Hx of CAD s/p of CABG and myocardial injury. Patient had mildly positive troponin with a flat curve most likely secondary to demand ischemia.  No chest pain. -Continue home Lipitor and Zetia.  HLD (hyperlipidemia) -Continue to Lipitor and zetia  Iron deficiency anemia Hemoglobin slowly trending down.  No active bleeding. 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 -s/p of RRL lobectomy and radiation therapy.  No acute issues.  Anxiety - Xanax 0.25 mg bid PRN  Weakness PT is recommending SNF. -TOC consult for placement  Obesity (BMI 30-39.9)-resolved as of 08/25/2022  BMI= 30.68  and BW= 99.8 -Diet and exercise.   -Encourage to lose weight.    Subjective: Patient was seen and examined today.  Continued to have lower back pain.  No other complaints.  He was getting ready for his scan.  Physical Exam: Vitals:   08/26/22 0347 08/26/22 0602 08/26/22 0806 08/26/22 1225  BP:  (!) 163/90 (!) 169/80 (!) 147/75  Pulse:  89 95 85  Resp:  16 20 16   Temp:   99 F (37.2 C) 98.1 F (36.7 C)  TempSrc:   Oral   SpO2:  97% 96% 98%  Weight: 96.2 kg     Height:       General.  Frail elderly man, in no acute distress. Pulmonary.  Lungs clear bilaterally, normal respiratory effort. CV.  Regular rate and rhythm, no JVD, rub or murmur. Abdomen.  Soft, nontender, nondistended, BS positive. CNS.  Alert and oriented .  No focal neurologic deficit. Extremities.  No edema, no cyanosis, pulses intact and symmetrical. Psychiatry.  Appears to have mild cognitive impairment.  Data Reviewed: Prior data reviewed  Family Communication: Talked with daughter  Disposition: Status is: Inpatient Remains inpatient appropriate because: Severity of illness   Planned  Discharge Destination: Skilled nursing facility  DVT prophylaxis.  Subcu heparin Time spent: 45 minutes  This record has been created using Systems analyst. Errors have been sought and corrected,but may not always be located. Such creation errors do not reflect on the standard of care.  Author: Lorella Nimrod, MD 08/26/2022 4:15 PM  For on call review [email or web address].

## 2022-08-26 NOTE — Progress Notes (Signed)
Occupational Therapy Treatment Patient Details Name: Rick Mcbride. MRN: [id number] DOB: September 11, 1941 Today's Date: 08/26/2022   History of present illness Rick Mcbride. is a 81 y.o. 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     Patient was recently hospitalized from 8/2 - 8/8 due to right femoral neck fracture.  Patient had surgery and finished the rehab.  Currently he is doing PT/OT at home.  He states that in the past several days, his lower back pain has worsened, difficult walking and doing physical therapy.  The lower back pain is constant, 7 out of 10 in severity, aching, nonradiating.  No loss control of bladder or bowel movement.  Denies new injury.  Patient also reports shortness of breath and dry cough.  No chest pain, fever or chills.  Denies nausea, vomiting, diarrhea or abdominal pain. Pt still has some mild right hip pain.  No symptoms of UTI. Pt had negative PE by V/Q scan which is ordered by his PCP yesterday.   OT comments  Pt seen for OT tx this date. Pt premedicated for pain, denies significant pain at rest in bed, but increases to at least 6/10 with any movement during session. Pt tolerated log rolling for male purewick application by nurse tech and as part of getting EOB with OT, requiring VC for hand placement and sequencing to improve technique. Pt tolerated sitting EOB for ~75min but ultimately endorsed feeling too pain limited to attempt standing. Pt agreeable to lateral scoots to improve positioning in bed, requiring  MOD A to complete. MAX A for return to bed using log roll technique 2/2 pain. MAX A for rolling side to side for linens change. Pt/spouse educated in cognitive behavioral pain coping strategies to optimize mgt of pain in addition to medication. Pt/spouse verbalized understanding. Pt continues to be very motivated to improve, however, continues to be limited primarily by significant pain but also weakness and impaired balance and activity tolerance. Continue to recommend SNF.   Recommendations for follow up therapy are one component of a multi-disciplinary discharge planning process, led by the attending physician.  Recommendations may be updated based on patient status, additional functional criteria and insurance authorization.    Follow Up Recommendations  Skilled nursing-short term rehab (<3 hours/day)    Assistance Recommended at Discharge Frequent or constant Supervision/Assistance  Patient can return home with the following  Two people to help with walking and/or transfers;A lot of help with bathing/dressing/bathroom;Assistance with cooking/housework;Direct supervision/assist for financial management;Assist for transportation;Help with stairs or ramp for entrance;Direct supervision/assist for medications management   Equipment Recommendations  Other (comment) (defer to next venue)    Recommendations for Other Services      Precautions / Restrictions Precautions Precautions: Fall;Back Restrictions Weight Bearing Restrictions: No       Mobility Bed Mobility Overal bed mobility: Needs Assistance Bed Mobility: Rolling, Sidelying to Sit, Sit to Sidelying Rolling: Mod assist, Max assist Sidelying to sit: Max assist     Sit to sidelying: Max assist General bed mobility comments: VC for sequencing to improve technique, pain limited  Transfers Overall transfer level: Needs assistance Equipment used: None Transfers: Bed to chair/wheelchair/BSC            Lateral/Scoot  Transfers: Mod assist General transfer comment: Pt declined standing attempt 2/2 pain, agreeable to lateral scoots to improve positioning, requiring MOD A to complete     Balance Overall balance assessment: Needs assistance Sitting-balance support: Bilateral upper extremity supported, Feet supported, Single extremity supported Sitting balance-Leahy Scale: Fair                                     ADL either performed or assessed with clinical judgement   ADL Overall ADL's : Needs assistance/impaired                     Lower Body Dressing: Maximal assistance;Bed level Lower Body Dressing Details (indicate cue type and reason): for socks and brief     Toileting- Clothing Manipulation and Hygiene: Maximal assistance;Bed level Toileting - Clothing Manipulation Details (indicate cue type and reason): log rolling, pain limited            Extremity/Trunk Assessment              Vision       Perception     Praxis      Cognition Arousal/Alertness: Awake/alert Behavior During Therapy: WFL for tasks assessed/performed Overall Cognitive Status: History of cognitive impairments - at baseline                                 General Comments: Pt reports decreased STM        Exercises      Shoulder Instructions       General Comments      Pertinent Vitals/ Pain       Pain Assessment Pain Assessment: 0-10 Pain Score: 6  Pain Location: low to mid back Pain Descriptors / Indicators: Aching, Discomfort, Grimacing, Sharp Pain Intervention(s): Limited activity within patient's tolerance, Monitored during session, Premedicated before session, Repositioned  Home Living                                          Prior Functioning/Environment              Frequency  Min 2X/week        Progress Toward Goals  OT Goals(current goals can now be found in the care plan section)  Progress towards OT goals:  Progressing toward goals  Acute Rehab OT Goals Patient Stated Goal: reduce pain, get stronger OT Goal Formulation: With patient/family Time For Goal Achievement: 09/04/22 Potential to Achieve Goals: [place] Discharge plan remains appropriate;Frequency remains appropriate    Co-evaluation                 AM-PAC OT "6 Clicks" Daily Activity     Outcome Measure   Help from another person eating meals?: A Little Help from another person taking care of personal grooming?: A Little Help from another person toileting, which includes using toliet, bedpan, or urinal?: A Lot Help from another person bathing (including washing, rinsing, drying)?: A Lot Help from another person to put on and taking off regular upper body clothing?: A Little Help from another person to put on and  taking off regular lower body clothing?: A Lot 6 Click Score: 15    End of Session    OT Visit Diagnosis: Other abnormalities of gait and mobility (R26.89);Muscle weakness (generalized) (M62.81);Pain;Other symptoms and signs involving cognitive function Pain - part of body:  (LBP)   Activity Tolerance Patient limited by pain   Patient Left in bed;with call bell/phone within reach;with bed alarm set;with family/visitor present   Nurse Communication          Time: 7639-4320 OT Time Calculation (min): 39 min  Charges: OT General Charges $OT Visit: 1 Visit OT Treatments $Self Care/Home Management : 38-52 mins  Ardeth Perfect., MPH, MS, OTR/L ascom [phone] 08/26/22, 4:11 PM

## 2022-08-26 NOTE — Assessment & Plan Note (Signed)
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 S/p repair on 08/16/2022 by vascular surgery. Patient had AAA repair done in 2018 by Dr. Lucky Cowboy. -Continue to monitor

## 2022-08-26 NOTE — TOC Progression Note (Addendum)
Transition of Care (TOC) - Progression Note    Patient Details  Name: Rick Mcbride. MRN: [id number] Date of Birth: 1940/12/27  Transition of Care [facility]) CM/SW Contact  Candie Chroman, LCSW Phone Number: 08/26/2022, 8:43 AM  Clinical Narrative:  No bed offers so far this morning. [facility] declined referral. Several others have not responded yet.   3:12 pm: Updated wife.  4:19 pm: Asked [facility] to review referral per wife request.  Expected Discharge Plan: Skilled Nursing Facility Barriers to Discharge: Continued Medical Work up  Expected Discharge Plan and Services Expected Discharge Plan: [place] Choice: [place] arrangements for the past 2 months: Single Family Home                                       Social Determinants of Health (SDOH) Interventions    Readmission Risk Interventions    06/13/2022    4:04 PM  Readmission Risk Prevention Plan  Transportation Screening Complete  PCP or Specialist Appt within 3-5 Days Complete  HRI or [place] Complete  Social Work Consult for [place] Planning/Counseling Complete  Palliative Care Screening Not Applicable  Medication Review Press photographer) Complete

## 2022-08-26 NOTE — Progress Notes (Signed)
[place], [place] 08/26/22  Subjective:   Hospital day # 12  Patient known to our practice from outpatient follow-up of CKD, with Dr Candiss Norse.  Patient was seen today on second floor. Patient wife was present in the room. Patient was resting  in the bed      10/18 0701 - 10/19 0700 In: 340 [P.O.:240; IV Piggyback:100] Out: 1350 [Urine:1350] Lab Results  Component Value Date   CREATININE 1.85 (H) 08/26/2022   CREATININE 1.88 (H) 08/24/2022   CREATININE 1.99 (H) 08/22/2022     Objective:  Vital signs in last 24 hours:  Temp:  [98.1 F (36.7 C)-100.2 F (37.9 C)] 98.1 F (36.7 C) (10/19 1225) Pulse Rate:  [80-95] 85 (10/19 1225) Resp:  [16-20] 16 (10/19 1225) BP: (145-169)/(71-90) 147/75 (10/19 1225) SpO2:  [96 %-100 %] 98 % (10/19 1225) Weight:  [96.2 kg] 96.2 kg (10/19 0347)  Weight change: 3.2 kg Filed Weights   08/23/22 0517 08/25/22 0413 08/26/22 0347  Weight: 89.9 kg 93 kg 96.2 kg    Intake/Output:    Intake/Output Summary (Last 24 hours) at 08/26/2022 1805 Last data filed at 08/26/2022 1418 Gross per 24 hour  Intake 620 ml  Output 950 ml  Net -330 ml     Physical Exam: General: elderly gentleman, laying in the bed  HEENT Anicteric, moist oral mucous membranes  Pulm/lungs Normal breathing effort, clear to auscultation  CVS/Heart No rub or gallop  Abdomen:  Soft, nontender  Extremities: No peripheral edema  Neurologic: Alert, oriented, pain with movement of right leg  Skin: No acute rashes          Basic Metabolic Panel:  Recent Labs  Lab 08/20/22 0606 08/22/22 0519 08/24/22 0604 08/26/22 0450  NA  --  138 138 135  K  --  3.5 3.7 3.6  CL  --  109 108 104  CO2  --  21* 23 22  GLUCOSE  --  117* 101* 87  BUN  --  32* 35* 28*  CREATININE 2.05* 1.99* 1.88* 1.85*  CALCIUM  --  8.4* 8.6* 8.4*  PHOS  --  3.6  --  3.3     CBC: Recent Labs  Lab 08/22/22 0519 08/24/22 0604 08/26/22 0450  WBC 10.6* 8.7  8.2  HGB 7.9* 7.2* 8.2*  HCT 25.0* 23.8* 26.6*  MCV 88.7 89.1 88.4  PLT 221 217 275     No results found for: "HEPBSAG", "HEPBSAB", "HEPBIGM"    Microbiology:  No results found for this or any previous visit (from the past 240 hour(s)).   Coagulation Studies: No results for input(s): "LABPROT", "INR" in the last 72 hours.  Urinalysis: No results for input(s): "COLORURINE", "LABSPEC", "PHURINE", "GLUCOSEU", "HGBUR", "BILIRUBINUR", "KETONESUR", "PROTEINUR", "UROBILINOGEN", "NITRITE", "LEUKOCYTESUR" in the last 72 hours.  Invalid input(s): "APPERANCEUR"     Imaging: No results found.   Medications:    ampicillin (OMNIPEN) IV 2 g (08/26/22 1201)    amLODipine  10 mg Oral Daily   atorvastatin  40 mg Oral QHS   calcitRIOL  0.25 mcg Oral q AM   clopidogrel  75 mg Oral Daily   donepezil  10 mg Oral QHS   ezetimibe  10 mg Oral QPM   ferrous sulfate  325 mg Oral Q2000   guaiFENesin  600 mg Oral BID   heparin injection (subcutaneous)  5,000 Units Subcutaneous Q8H   ipratropium  2-4 spray Each Nare QHS   lidocaine  1 patch Transdermal Q24H  loratadine  10 mg Oral Daily   melatonin  10 mg Oral QHS   methocarbamol  500 mg Oral TID   metoprolol succinate  25 mg Oral Daily   multivitamin with minerals  1 tablet Oral QPC lunch   oxyCODONE-acetaminophen  2 tablet Oral Q6H   pantoprazole  40 mg Oral QAC breakfast   polyethylene glycol  17 g Oral BID   senna-docusate  2 tablet Oral BID   zolpidem  10 mg Oral QHS   acetaminophen, albuterol, chlorproMAZINE, dextromethorphan-guaiFENesin, hydrALAZINE, HYDROmorphone (DILAUDID) injection, ondansetron (ZOFRAN) IV  Assessment/ Plan:  81 y.o. 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, history of stroke   admitted on 08/14/2022 for SOB (shortness of  breath) [R06.02] Bronchitis [J40] Elevated troponin [R79.89] COPD exacerbation (HCC) [J44.1] CAP (community acquired pneumonia) [J18.9] Generalized weakness [R53.1] AKI (acute kidney injury) ([place]) [N17.9] Acute midline low back pain without sciatica [M54.50]   Impression 1-Acute kidney injury Patient had AKI most likely secondary to contrast-induced nephropathy Patient peak creatinine was at 2.7 on October 7 now it has come down to his baseline of around 1.9--2.0  2-CKD stage 3b Patient has CKD most likely secondary to hypertension With some contribution from age associated  decline as patient is 81 years old Kidney function is at baseline.  Urinalysis shows small proteinuria.     3-Anemia of chronic disease Patient hemoglobin is stable No need for transfusion for now   4-E faecalis bacteremia Patient is being followed by ID  5-Diastolic CHF   Patient is well compensated   2D echo from 06/14/2022 shows LVEF 60 to 05%, grade 1 diastolic dysfunction  6-Chronic metabolic acidosis  patient bicarb is at now better  no need for p.o. bicarb for now   Plan: Creatinine stable.    Primary team to continue IV antibiotics.       LOS: 88 Jadyn Brasher s [facility] 10/19/20236:05 [facility] [place], Erma  Note: This note was prepared with Dragon dictation. Any transcription errors are unintentional

## 2022-08-26 NOTE — Assessment & Plan Note (Addendum)
Patient was found to have Enterococcus faecalis bacteremia, most likely secondary to UTI. TTE and TEE was negative for endocarditis.  Unable to rule out epidural abscess or discitis as unable to obtain MRI due to pacemaker.  Wife does not want him to go to [facility]. Repeat blood cultures done on 08/15/2022 remain negative. Infectious disease on board.  WBC scan was negative for discitis or osteomyelitis. -Continue with ampicillin, current plan is to continue for 6 weeks, patient will be getting IV ampicillin while in the hospital and p.o. 1 g every 8 hourly on discharge.

## 2022-08-27 DIAGNOSIS — M545 Low back pain, unspecified: Secondary | ICD-10-CM | POA: Diagnosis not present

## 2022-08-27 DIAGNOSIS — B952 Enterococcus as the cause of diseases classified elsewhere: Secondary | ICD-10-CM | POA: Diagnosis not present

## 2022-08-27 DIAGNOSIS — T829XXA Unspecified complication of cardiac and vascular prosthetic device, implant and graft, initial encounter: Secondary | ICD-10-CM | POA: Diagnosis not present

## 2022-08-27 DIAGNOSIS — R7881 Bacteremia: Secondary | ICD-10-CM | POA: Diagnosis not present

## 2022-08-27 DIAGNOSIS — N184 Chronic kidney disease, stage 4 (severe): Secondary | ICD-10-CM | POA: Diagnosis not present

## 2022-08-27 LAB — SEDIMENTATION RATE: Sed Rate: 127 mm/hr — ABNORMAL HIGH (ref 0–20)

## 2022-08-27 LAB — C-REACTIVE PROTEIN: CRP: 22.2 mg/dL — ABNORMAL HIGH (ref <1.0)

## 2022-08-27 MED ORDER — MENTHOL 3 MG MT LOZG
1.0000 | LOZENGE | OROMUCOSAL | Status: DC | PRN
  Administered 2022-08-28: 3 mg via ORAL
  Filled 2022-08-27: qty 9

## 2022-08-27 MED ORDER — AMOXICILLIN 500 MG PO CAPS: 1000.0000 mg | ORAL_CAPSULE | Freq: Three times a day (TID) | ORAL | Status: DC

## 2022-08-27 NOTE — TOC Progression Note (Addendum)
Transition of Care (TOC) - Progression Note    Patient Details  Name: Rick Mcbride. MRN: [id number] Date of Birth: February 19, 1941  Transition of Care [facility]) CM/SW [place], LCSW Phone Number: 08/27/2022, 1:22 PM  Clinical Narrative:  [facility] patient but won't have LTC beds if needed later on. Expanded search to see if any other facilities can consider him.   4:00 pm: Received call from son-in-law. Answered questions.   Expected Discharge Plan: [place] Barriers to Discharge: Continued Medical Work up  Expected Discharge Plan and Services Expected Discharge Plan: [place] Choice: [place] arrangements for the past 2 months: Single Family Home                                       Social Determinants of Health (SDOH) Interventions    Readmission Risk Interventions    06/13/2022    4:04 PM  Readmission Risk Prevention Plan  Transportation Screening Complete  PCP or Specialist Appt within 3-5 Days Complete  HRI or [place] Complete  Social Work Consult for [place] Planning/Counseling Complete  Palliative Care Screening Not Applicable  Medication Review Press photographer) Complete

## 2022-08-27 NOTE — Progress Notes (Signed)
Progress Note   Patient: Rick Mcbride. QVZ:[id number] DOB: 06/17/1941 DOA: 08/14/2022     13 DOS: the patient was seen and examined on 08/27/2022   Brief hospital course: Rick Mcbride. is a 81 y.o. 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 Patient had surgery and finished SNF rehab. Currently he is doing PT/OT at home), chronic lower back pain (L5-S1 fusion), who presents with lower back pain and shortness breath. Pt had negative PE by V/Q scan which is ordered by his PCP day PTA, which was done for persistent SOB postoperatively. 10/07: WBC 3.8, negative COVID PCR, lactic acid 0.9, troponin level 31, 30, slightly worsening renal function with creatinine 2.69, BUN 31, GFR 23 (recent baseline creatinine 2.39 06/15/2022), temperature normal, blood pressure 172/84, 129/64, RR 83, heart rate 83, RR 17, oxygen saturation 98% on room air.  Chest x-ray showed possible mild patchy infiltration in left lower lobe.  CT head negative.  CT scan showed enlargement of AAA from previous 6.9 to 7.6 cm now.  Patient is admitted to telemetry bed as inpatient. Dr. Lorenso Courier of [facility] was consulted re: AAA, needs CTA, nephrology consulted and recommended IV fluids prior to CTA of Abd/Pelvis. Cardiology also consulted for surgical optimization/risk stratification - medically optimized at this time, troponin mild elevation likely d/t CKD4. BCx (+) E Faecalis, ID recommended r/o endocarditis/pacer infection w/ TEE and vascular surgery to assess re: graft involvement, changed Abx to ampicillin and repeat Bcx, also recommend MRI L-spine or CT  w/ contrast, pt has pacemaker   10/08: Pt and wife agreeable to TEE and MRI, though MRI would have to be at [facility]. Per vascular: "Noted bacteremia- however, patient with chronic progressive lower back pain; expanding aortic sac may be contributory to pain- not definitive. Therefore, preferable to intervene sooner than later." Per ID, would consider in order of importance to 1) treat bacteremia, 2) address aorto-bifem leak, 3) TEE, 4) MRI/CT L-spine. IF we decide to do CT, this can be done in-house, vs RI would require transfer. Will plan to proceed w/ IV abx, do vascular procedure tomorrow as scheduled, and see how he progresses from there.  10/09: Vascular procedure today to fix graft.  Radiology reached out this morning, concern for small PE noted on CTA abdomen/pelvis.  Holding off on full CTA chest at this time due to concern with contrast/renal function but will plan for heparin. 10/10: Renal function okay. CT w/ contrast lumbar spine - no osteomyelitis noted. CTA chest no PE but Significant mucus within RIGHT mainstem bronchus extending into bronchus intermedius and RIGHT lower lobe. Continue IV fluids and recheck BMP in AM. TEE planned for 10/12.  10/11-17: TEE done on 10/17 was negative for any endocarditis. Pulmonology was also consulted for concern of mucous plugging, has been resolved.6 renal function seems stable. Repeat blood cultures from 08/15/2022 remains negative, Unable to rule out epidural abscess or as still as MRI cannot be done due to pacemaker and wife refused to go to [facility] for MRI.  Will continue ampicillin for 6 weeks. Neurosurgery is recommending conservative management with brace.  Avoid NSAID due to CKD. PT/OT are recommending SNF.  10/18: Infectious disease ordered WBC scan to rule out epidural abscess or discitis.  Continued to have lower back pain.  10/19: Continue to have lower back pain.  Scheduled for WBC scan today.  Pending SNF placement-no bed offer  yet.  10/20: WBC scan was negative for any concern of discitis or osteomyelitis.  Still no bed offer. Infectious disease to determine the duration of antibiotics now. Patient was complaining of some worsening hoarseness of his voice for the past couple of days, no pain.  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  Advised to follow-up with ENT if he continues to have hoarseness.  Recently had his follow-up.  Next scan is due in December.  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 S/p repair on 08/16/2022 by vascular surgery. Patient had AAA repair done in 2018 by Dr. Lucky Cowboy. -Continue to monitor  Bacteremia due to Enterococcus Patient was found to have Enterococcus faecalis bacteremia, most likely secondary to UTI. TTE and TEE was negative for endocarditis.  Unable to rule out epidural abscess or discitis as unable to obtain MRI due to pacemaker.  Wife does not want him to go to [facility]. Repeat blood cultures done on 08/15/2022 remain negative. Infectious disease on board.  WBC scan today to rule out epidural abscess or discitis. -Continue with ampicillin, current plan is to continue for 6 weeks.  AAA (abdominal aortic aneurysm) without rupture (HCC)-resolved as of 08/25/2022 S/p of AAA repair 2018 by Dr. Lucky Cowboy. Now the size has enlarged from 6.9 to 7.6 cm -consulted Dr. Lorenso Courier of [facility]  Addendum: Per Dr. Lorenso Courier, patient  likely need surgical intervention. Pt will need CTA as soon as possible.  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 Candiss Norse.  He recommended to treat patient with IV fluid for few hours before doing CTA.  Patient is started on NS at 75/h. Will do CTA at three hours (14:00) after staring IVF. Pt will need to continue IVF until tomorrow AM after CTA is done.  -CTA of abd/pelvis is order (will be done at 14:00) -continue NS at 75cc/h -will hold [place]    Low back pain Lumbar OA, DDD Multiple lobar spine hardware This seems to be a chronic issue, has worsened recently. 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 There is osteopenia degenerative change of the lumbar spine with discogenic mild grade 1 retrolisthesis unchanged at L1-2 and L2-3, slight dextroscoliosis. Unable to obtain MRI due to pacemaker. Neurosurgery is recommending conservative management with brace and outpatient follow-up. -pain control: As needed Percocet, Tylenol -As needed Robaxin -lidoderm  COPD exacerbation ([place]) Concern of some COPD exacerbation on admission with some wheezing which has been resolved.  No infiltrate on imaging but there was a concern of mucous plugging. Procalcitonin was elevated at 0.39. Patient received a course of steroids and antibiotics. Currently stable on room air. -Continue to monitor   Aneurysm  of right common iliac artery (HCC)-resolved as of 08/25/2022 -f/u with [facility]  Mucus plugging of bronchi SOB w/ mucus plugging of bronchi noted on CT PE resolved on CTA (recently diagnosed) Pulmonary was consulted and patient received MetaNeb with albuterol.  They also recommend incentive spirometry and bronchopulmonary hygiene.  Respiratory status improved. -Continue to monitor   Chronic kidney disease, stage IV (severe) (HCC) AKI improved.  Creatinine currently at baseline. -Continue to  monitor -Avoid nephrotoxins  Myocardial injury-resolved as of 08/25/2022 -see above  Essential hypertension Blood pressure mildly elevated. Home Cozaar was switched with amlodipine. -Continue with amlodipine and metoprolol -Continue with as needed hydralazine  Chronic diastolic CHF (congestive heart failure) (HCC) 2D echo on 06/14/2022 showed EF of 60 to 65% with grade 1 diastolic dysfunction.  Patient does not have leg edema.  No pulm edema chest x-ray.  CHF seem to be compensated. -Continue to monitor  CAD S/P CABG x 3 Hx of CAD s/p of CABG and myocardial injury. Patient had mildly positive troponin with a flat curve most likely secondary to demand ischemia.  No chest pain. -Continue home Lipitor and Zetia.  HLD (hyperlipidemia) -Continue to Lipitor and zetia  Iron deficiency anemia Hemoglobin slowly trending down.  No active bleeding. 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 -s/p of RRL lobectomy and radiation therapy.  No acute issues.  Anxiety - Xanax 0.25 mg bid PRN  Weakness PT is recommending SNF. -TOC consult for placement  Obesity (BMI 30-39.9)-resolved as of 08/25/2022  BMI= 30.68  and BW= 99.8 -Diet and exercise.   -Encourage to lose weight.    Subjective: Patient with some improvement in lower back pain today.  He was complaining of worsening hoarseness of voice.  History of right upper lobe lung cancer and also history of cancer at the base of the tongue s/p radiation.  Physical Exam: Vitals:   08/27/22 0430 08/27/22 0511 08/27/22 0749 08/27/22 1241  BP:  (!) 155/80 (!) 153/79 (!) 150/77  Pulse:  83 75 80  Resp:  17 18 19   Temp:   98.4 F (36.9 C) 98.6 F (37 C)  TempSrc:    Oral  SpO2:  100% 93% 95%  Weight: 90.4  kg     Height:       General.  Frail elderly man, in no acute distress. Pulmonary.  Lungs clear bilaterally, normal respiratory effort. CV.  Regular rate and rhythm, no JVD, rub or murmur. Abdomen.  Soft, nontender, nondistended, BS positive. CNS.  Alert and oriented .  No focal neurologic deficit. Extremities.  No edema, no cyanosis, pulses intact and symmetrical.  Data Reviewed: Prior data reviewed  Family Communication: Discussed with wife at bedside  Disposition: Status is: Inpatient Remains inpatient appropriate because: Severity of illness   Planned Discharge Destination: Skilled nursing facility  DVT prophylaxis.  Subcu heparin Time spent: 46 minutes  This record has been created using Systems analyst. Errors have been sought and corrected,but may not always be located. Such creation errors do not reflect on the standard of care.  Author: Lorella Nimrod, MD 08/27/2022 3:58 PM  For on call review [email or web address].

## 2022-08-27 NOTE — Progress Notes (Signed)
Physical Therapy Treatment Patient Details Name: Rick Mcbride. MRN: [id number] DOB: June 18, 1941 Today's Date: 08/27/2022   History of Present Illness Rick Mcbride. is a 81 y.o. 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     Patient was recently hospitalized from 8/2 - 8/8 due to right femoral neck fracture.  Patient had surgery and finished the rehab.  Currently he is doing PT/OT at home.  He states that in the past several days, his lower back pain has worsened, difficult walking and doing physical therapy.  The lower back pain is constant, 7 out of 10 in severity, aching, nonradiating.  No loss control of bladder or bowel movement.  Denies new injury.  Patient also reports shortness of breath and dry cough.  No chest pain, fever or chills.  Denies nausea, vomiting, diarrhea or abdominal pain. Pt still has some mild right hip pain.  No symptoms of UTI. Pt had negative PE by V/Q scan which is ordered by his PCP yesterday.    PT Comments    Pt seen for PT tx with pt agreeable to tx. Pt willing/motivated to get OOB to recliner. Pt requires mod<>max assist for rolling L<>R and max assist to come to sitting EOB sidelyin>sitting. Pt also requires cuing throughout session for mobility technique to maintain back precautions. Once sitting EOB pt demonstrates fair sitting balance but does require cuing to scoot forward for STS attempts then back to prevent sliding off EOB. Pt noted to have BM but appears to have further BM upon sitting. Attempted STS  from elevated EOB with RW to allow NT to clean pt but pt unsuccessful with clearing buttocks from EOB. Pt performs lateral scoot to R with min>mod assist but ultimately assisted back to bed with +2 assist 2/2 fatigue. Pt performs rolling L<>R to allow PT & NT to clean pt but then pt eager to sit in chair. Pt required +2 assist to return to sitting EOB but is able to complete lateral scoot to drop arm recliner on R with max multimodal cuing & manual facilitation for head/hips relationship & repositioning feet. Pt follows commands well for lateral scoot & repositioning in recliner. Will continue to follow pt acutely; pt would benefit from further OOB activity as pt can successfully perform lateral scoots to drop arm recliner. Pt may even benefit from STS attempts with STS lift.    Recommendations for follow up therapy are one component of a multi-disciplinary discharge planning process, led by the attending physician.  Recommendations may be updated based on patient status, additional functional criteria and insurance authorization.  Follow Up Recommendations  Skilled nursing-short term rehab (<3 hours/day) Can patient physically be transported by private vehicle: No   Assistance Recommended at Discharge Frequent or constant Supervision/Assistance  Patient can return home with the following Two people to help with walking and/or transfers;Two people to help with bathing/dressing/bathroom;Help with stairs or ramp for entrance;Assist for transportation;Assistance with cooking/housework   Equipment Recommendations  None recommended by PT    Recommendations for Other Services       Precautions / Restrictions Precautions  Precautions: Fall;Back Restrictions Weight Bearing Restrictions: No     Mobility  Bed Mobility Overal bed mobility: Needs Assistance Bed Mobility: Rolling, Sidelying to Sit, Sit to Sidelying Rolling: Mod assist, Max assist (pt rolls L with mod asist, rolls R with max assist,  requires cuing for hand placement, sequencing, BLE positioning) Sidelying to sit: Max assist (max assist +1 increased to +2 by end 2/2 fatigue)     Sit to sidelying: Max assist, +2 for physical assistance      Transfers Overall transfer level: Needs assistance Equipment used: None Transfers: Sit to/from Stand Sit to Stand:  (unable despite 2 attempts from elevated EOB with RW)          Lateral/Scoot Transfers: Mod assist (max fade to min, PT provides manual facilitation, verbal & tactile cuing for head/hips relationship, assistance with repositioning feet, assistance with scooting)      Ambulation/Gait                   Stairs             Wheelchair Mobility    Modified Rankin (Stroke Patients Only)       Balance Overall balance assessment: Needs assistance Sitting-balance support: Bilateral upper extremity supported, Feet supported Sitting balance-Leahy Scale: Fair                                      Cognition Arousal/Alertness: Awake/alert Behavior During Therapy: WFL for tasks assessed/performed Overall Cognitive Status: History of cognitive impairments - at baseline                                 General Comments: Follows simple commands with extra time througout session, willin to participate & get OOB.        Exercises      General Comments        Pertinent Vitals/Pain Pain Assessment Pain Assessment: Faces Faces Pain Scale: Hurts even more Pain Location: back during mobility Pain Descriptors / Indicators: Grimacing, Discomfort Pain Intervention(s): Monitored during session, Repositioned, Premedicated before session    Home Living                          Prior Function            PT Goals (current goals can now be found in the care plan section) Acute Rehab PT Goals Patient Stated Goal: Feel better PT Goal Formulation: With patient/family Time For Goal Achievement:  09/04/22 Potential to Achieve Goals: Fair Progress towards PT goals: Progressing toward goals    Frequency    Min 2X/week      PT Plan Current plan remains appropriate    Co-evaluation              AM-PAC PT "6 Clicks" Mobility   Outcome Measure  Help needed turning from your back to your side while in a flat bed without using bedrails?: Total Help needed moving from lying on your back to sitting on the side of a flat bed without using bedrails?: Total Help needed moving to and from a bed to a chair (including a wheelchair)?: A Lot Help needed standing up from a chair using your arms (e.g., wheelchair or bedside chair)?: Total Help needed to walk in hospital room?: Total Help needed climbing 3-5 steps with a  railing? : Total 6 Click Score: 7    End of Session   Activity Tolerance: Patient tolerated treatment well Patient left: in chair;with chair alarm set;with call bell/phone within reach;with nursing/sitter in room Nurse Communication: Mobility status PT Visit Diagnosis: Unsteadiness on feet (R26.81);Other abnormalities of gait and mobility (R26.89);Muscle weakness (generalized) (M62.81);Difficulty in walking, not elsewhere classified (R26.2);Pain Pain - part of body:  (back)     Time: 7824-2353 PT Time Calculation (min) (ACUTE ONLY): 38 min  Charges:  $Therapeutic Activity: 38-52 mins                     Lavone Nian, PT, DPT 08/27/22, 12:30 PM   Rick Mcbride 08/27/2022, 12:26 PM

## 2022-08-27 NOTE — Progress Notes (Addendum)
[place], [place] 08/27/22  Subjective:   Hospital day # 13  Patient known to our practice from outpatient follow-up of CKD, with Dr Candiss Norse.  Patient was seen today on second floor. Resting comfortably in bed Appetite remains without nausea and vomiting Denies shortness of breath.   I discussed patient's kidney related issues at length with patient's wife.    10/19 0701 - 10/20 0700 In: 620 [P.O.:620] Out: 900 [Urine:900] Lab Results  Component Value Date   CREATININE 1.85 (H) 08/26/2022   CREATININE 1.88 (H) 08/24/2022   CREATININE 1.99 (H) 08/22/2022     Objective:  Vital signs in last 24 hours:  Temp:  [98.1 F (36.7 C)-99.2 F (37.3 C)] 98.6 F (37 C) (10/20 1241) Pulse Rate:  [75-87] 80 (10/20 1241) Resp:  [16-24] 19 (10/20 1241) BP: (131-172)/(72-92) 150/77 (10/20 1241) SpO2:  [93 %-100 %] 95 % (10/20 1241) Weight:  [90.4 kg] 90.4 kg (10/20 0430)  Weight change: -5.8 kg Filed Weights   08/25/22 0413 08/26/22 0347 08/27/22 0430  Weight: 93 kg 96.2 kg 90.4 kg    Intake/Output:    Intake/Output Summary (Last 24 hours) at 08/27/2022 1323 Last data filed at 08/27/2022 1229 Gross per 24 hour  Intake 6320 ml  Output 1250 ml  Net 5070 ml      Physical Exam: General: elderly gentleman, laying in the bed  HEENT Anicteric, moist oral mucous membranes  Pulm/lungs Normal breathing effort, clear to auscultation  CVS/Heart No rub or gallop  Abdomen:  Soft, nontender  Extremities: No peripheral edema  Neurologic: Alert, oriented, pain with movement of right leg  Skin: No acute rashes          Basic Metabolic Panel:  Recent Labs  Lab 08/22/22 0519 08/24/22 0604 08/26/22 0450  NA 138 138 135  K 3.5 3.7 3.6  CL 109 108 104  CO2 21* 23 22  GLUCOSE 117* 101* 87  BUN 32* 35* 28*  CREATININE 1.99* 1.88* 1.85*  CALCIUM 8.4* 8.6* 8.4*  PHOS 3.6  --  3.3      CBC: Recent Labs  Lab 08/22/22 0519 08/24/22 0604  08/26/22 0450  WBC 10.6* 8.7 8.2  HGB 7.9* 7.2* 8.2*  HCT 25.0* 23.8* 26.6*  MCV 88.7 89.1 88.4  PLT 221 217 275      No results found for: "HEPBSAG", "HEPBSAB", "HEPBIGM"    Microbiology:  No results found for this or any previous visit (from the past 240 hour(s)).   Coagulation Studies: No results for input(s): "LABPROT", "INR" in the last 72 hours.  Urinalysis: No results for input(s): "COLORURINE", "LABSPEC", "PHURINE", "GLUCOSEU", "HGBUR", "BILIRUBINUR", "KETONESUR", "PROTEINUR", "UROBILINOGEN", "NITRITE", "LEUKOCYTESUR" in the last 72 hours.  Invalid input(s): "APPERANCEUR"     Imaging: NM WBC SCAN TUMOR LOC LIMITED  Result Date: 08/26/2022 CLINICAL DATA:  Severe low back pain. Evaluate for possible discitis/osteomyelitis. EXAM: NUCLEAR MEDICINE LEUKOCYTE SCAN TECHNIQUE: Following intravenous administration of radiolabeled white blood cells, images of the head, neck, trunk, and extremities were obtained on subsequent days. RADIOPHARMACEUTICALS:  6.95 Tc 99 Ceretec labeled autologous leukocytes IV COMPARISON:  CT scan 08/14/2022 FINDINGS: No areas of abnormal uptake are identified in the spine to suggest discitis or osteomyelitis. Normal uptake in the liver, spleen kidneys with excretion of the bladder. There is also normal excretion into the gallbladder. Stable aortoiliac stent graft for abdominal aortic aneurysm. No obvious complication. Upper left renal calculus is stable. Small layering gallstones are noted the gallbladder. IMPRESSION: No findings  for discitis/osteomyelitis. Electronically Signed   By: Marijo Sanes M.D.   On: 08/26/2022 18:29     Medications:    ampicillin (OMNIPEN) IV 2 g (08/27/22 1221)    amLODipine  10 mg Oral Daily   atorvastatin  40 mg Oral QHS   calcitRIOL  0.25 mcg Oral q AM   clopidogrel  75 mg Oral Daily   donepezil  10 mg Oral QHS   ezetimibe  10 mg Oral QPM   ferrous sulfate  325 mg Oral Q2000   guaiFENesin  600 mg Oral BID    heparin injection (subcutaneous)  5,000 Units Subcutaneous Q8H   ipratropium  2-4 spray Each Nare QHS   lidocaine  1 patch Transdermal Q24H   loratadine  10 mg Oral Daily   melatonin  10 mg Oral QHS   methocarbamol  500 mg Oral TID   metoprolol succinate  25 mg Oral Daily   multivitamin with minerals  1 tablet Oral QPC lunch   oxyCODONE-acetaminophen  2 tablet Oral Q6H   pantoprazole  40 mg Oral QAC breakfast   polyethylene glycol  17 g Oral BID   senna-docusate  2 tablet Oral BID   zolpidem  10 mg Oral QHS   acetaminophen, albuterol, chlorproMAZINE, dextromethorphan-guaiFENesin, hydrALAZINE, HYDROmorphone (DILAUDID) injection, ondansetron (ZOFRAN) IV  Assessment/ Plan:  81 y.o. 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, history of stroke   admitted on 08/14/2022 for SOB (shortness of breath) [R06.02] Bronchitis [J40] Elevated troponin [R79.89] COPD exacerbation (HCC) [J44.1] CAP (community acquired pneumonia) [J18.9] Generalized weakness [R53.1] AKI (acute kidney injury) ([place]) [N17.9] Acute midline low back pain without sciatica [M54.50]   Impression 1-Acute kidney injury Patient had AKI most likely secondary to contrast-induced nephropathy Patient peak creatinine was at 2.7 on October 7 now it has come down to his baseline of around 1.9--2.0  Creatinine remains stable  2-CKD stage 3b Patient has CKD most likely secondary to hypertension With some contribution from age associated  decline as patient is 81 years old. Urinalysis shows small proteinuria.   Stable  3-Anemia of chronic disease Patient hemoglobin is stable Hgb below target. Will monitor   4-E faecalis bacteremia Patient is being followed by ID Ampicillin planned for 6 weeks  5-Diastolic CHF   Patient is well  compensated   2D echo from 06/14/2022 shows LVEF 60 to 40%, grade 1 diastolic dysfunction  6-Chronic metabolic acidosis  patient bicarb is at now better  no need for p.o. bicarb for now  Due to consistent renal stability, we will sign off at this time. Feel free to contact us with any questions or concerns.      LOS: [place] 10/20/20231:23 PM  [place], [place]  I saw and evaluated the patient with Colon Flattery, NP.  I personally formulated the plan of care.  I agree with the findings and plan as documented in the note except as noted .

## 2022-08-27 NOTE — Progress Notes (Addendum)
ID Still in lot of back pain Sat in chair for an hour  Says voice hoarse   O/e Awake and alert Patient Vitals for the past 24 hrs:  BP Temp Pulse Resp SpO2 Weight  08/27/22 0749 (!) 153/79 98.4 F (36.9 C) 75 18 93 % --  08/27/22 0511 (!) 155/80 -- 83 17 100 % --  08/27/22 0430 -- -- -- -- -- 90.4 kg  08/27/22 0403 (!) 161/83 98.1 F (36.7 C) 76 19 97 % --  08/27/22 0034 131/72 99.2 F (37.3 C) 85 16 98 % --  08/26/22 2200 (!) 148/76 -- 87 18 99 % --  08/26/22 2111 (!) 172/92 98.4 F (36.9 C) 83 (!) 24 98 % --  08/26/22 1932 (!) 159/74 98.7 F (37.1 C) 82 16 96 % --  08/26/22 1225 (!) 147/75 98.1 F (36.7 C) 85 16 98 % --   Chest b/l air entry [id number] Pacemaker Abd soft CNS moves all extremities  Labs    Latest Ref Rng & Units 08/26/2022    4:50 AM 08/24/2022    6:04 AM 08/22/2022    5:19 AM  CBC  WBC 4.0 - 10.5 K/uL 8.2  8.7  10.6   Hemoglobin 13.0 - 17.0 g/dL 8.2  7.2  7.9   Hematocrit 39.0 - 52.0 % 26.6  23.8  25.0   Platelets 150 - 400 K/uL 275  217  221        Latest Ref Rng & Units 08/26/2022    4:50 AM 08/24/2022    6:04 AM 08/22/2022    5:19 AM  CMP  Glucose 70 - 99 mg/dL 87  101  117   BUN 8 - 23 mg/dL 28  35  32   Creatinine 0.61 - 1.24 mg/dL 1.85  1.88  1.99   Sodium 135 - 145 mmol/L 135  138  138   Potassium 3.5 - 5.1 mmol/L 3.6  3.7  3.5   Chloride 98 - 111 mmol/L 104  108  109   CO2 22 - 32 mmol/L 22  23  21    Calcium 8.9 - 10.3 mg/dL 8.4  8.6  8.4     Micro BC- 08/14/22 Enterococcis UC - enterococcus 08/15/22 BC-NG   Impression/recommendation  Severe Low back pain- has previous L5-s1 fusion  discitis. Epidural abscess or osteo cannot be ruled out as MRI cannot be done due to pacemaker. CT scan did not show any infection Tagged WBC scan did not show any focus in the lumbar spine HE will get PET scan as OP ESR > 100 With underlying maliganncy it could be high- also high in vertebral infection  Enterococcus bacteremia-  TEE neg for  endocarditis UC positive for enterococcus Repeat blood culture neg He is on ampicillin and has day 13- He is going to need 6 weeks of antibiotic 09/27/22 Continue IV while in hospital- on discharge can be switched to Po amoxicillin 1 gram Q 8   AAA- s/p endovascular stent- worsening aneurysm with endo leak 2. 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

## 2022-08-28 DIAGNOSIS — R7881 Bacteremia: Secondary | ICD-10-CM | POA: Diagnosis not present

## 2022-08-28 DIAGNOSIS — M545 Low back pain, unspecified: Secondary | ICD-10-CM | POA: Diagnosis not present

## 2022-08-28 DIAGNOSIS — B952 Enterococcus as the cause of diseases classified elsewhere: Secondary | ICD-10-CM | POA: Diagnosis not present

## 2022-08-28 NOTE — Progress Notes (Signed)
Progress Note   Patient: Rick Mcbride. TFT:[id number] DOB: 1941/02/26 DOA: 08/14/2022     14 DOS: the patient was seen and examined on 08/28/2022   Brief hospital course: Sten T Fintan Grater. is a 81 y.o. 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 Patient had surgery and finished SNF rehab. Currently he is doing PT/OT at home), chronic lower back pain (L5-S1 fusion), who presents with lower back pain and shortness breath. Pt had negative PE by V/Q scan which is ordered by his PCP day PTA, which was done for persistent SOB postoperatively. 10/07: WBC 3.8, negative COVID PCR, lactic acid 0.9, troponin level 31, 30, slightly worsening renal function with creatinine 2.69, BUN 31, GFR 23 (recent baseline creatinine 2.39 06/15/2022), temperature normal, blood pressure 172/84, 129/64, RR 83, heart rate 83, RR 17, oxygen saturation 98% on room air.  Chest x-ray showed possible mild patchy infiltration in left lower lobe.  CT head negative.  CT scan showed enlargement of AAA from previous 6.9 to 7.6 cm now.  Patient is admitted to telemetry bed as inpatient. Dr. Lorenso Courier of [facility] was consulted re: AAA, needs CTA, nephrology consulted and recommended IV fluids prior to CTA of Abd/Pelvis. Cardiology also consulted for surgical optimization/risk stratification - medically optimized at this time, troponin mild elevation likely d/t CKD4. BCx (+) E Faecalis, ID recommended r/o endocarditis/pacer infection w/ TEE and vascular surgery to assess re: graft involvement, changed Abx to ampicillin and repeat Bcx, also recommend MRI L-spine or CT  w/ contrast, pt has pacemaker   10/08: Pt and wife agreeable to TEE and MRI, though MRI would have to be at [facility]. Per vascular: "Noted bacteremia- however, patient with chronic progressive lower back pain; expanding aortic sac may be contributory to pain- not definitive. Therefore, preferable to intervene sooner than later." Per ID, would consider in order of importance to 1) treat bacteremia, 2) address aorto-bifem leak, 3) TEE, 4) MRI/CT L-spine. IF we decide to do CT, this can be done in-house, vs RI would require transfer. Will plan to proceed w/ IV abx, do vascular procedure tomorrow as scheduled, and see how he progresses from there.  10/09: Vascular procedure today to fix graft.  Radiology reached out this morning, concern for small PE noted on CTA abdomen/pelvis.  Holding off on full CTA chest at this time due to concern with contrast/renal function but will plan for heparin. 10/10: Renal function okay. CT w/ contrast lumbar spine - no osteomyelitis noted. CTA chest no PE but Significant mucus within RIGHT mainstem bronchus extending into bronchus intermedius and RIGHT lower lobe. Continue IV fluids and recheck BMP in AM. TEE planned for 10/12.  10/11-17: TEE done on 10/17 was negative for any endocarditis. Pulmonology was also consulted for concern of mucous plugging, has been resolved.6 renal function seems stable. Repeat blood cultures from 08/15/2022 remains negative, Unable to rule out epidural abscess or as still as MRI cannot be done due to pacemaker and wife refused to go to [facility] for MRI.  Will continue ampicillin for 6 weeks. Neurosurgery is recommending conservative management with brace.  Avoid NSAID due to CKD. PT/OT are recommending SNF.  10/18: Infectious disease ordered WBC scan to rule out epidural abscess or discitis.  Continued to have lower back pain.  10/19: Continue to have lower back pain.  Scheduled for WBC scan today.  Pending SNF placement-no bed offer  yet.  10/20: WBC scan was negative for any concern of discitis or osteomyelitis.  Still no bed offer. Infectious disease to determine the duration of antibiotics now. Patient was complaining of some worsening hoarseness of his voice for the past couple of days, no pain.  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  Advised to follow-up with ENT if he continues to have hoarseness.  Recently had his follow-up.  PET scan is due in December.  10/21: Remained hemodynamically stable.  ID recommendations remained for 6 weeks of ampicillin, until 09/27/2022.  We will continue IV while in the hospital and convert to p.o. amoxicillin 1 g Q8 hourly on discharge.  Consultants:  Vascular surgery - TAA, AAA enlarged, R common iliac aneurysm Nephrology - CKD4 needing contrast study Cardiology - clearance for vascular procedure given CAD/CABG, Chronic HFpEF ID - (+)Bcx  Procedures: 08/16/2022: Repair of aortic aneurysm sac type II endoleak       Assessment and Plan: * Thoracic aortic aneurysm without rupture (HCC) Type II aortic aneurysm endoleak AAA (abdominal aortic aneurysm) without rupture (HCC) Thoracic aortic aneurysm without rupture ([place]) Aneurysm of right common iliac artery ([place]). S/p repair on 08/16/2022 by vascular surgery. Patient had AAA repair done in 2018 by Dr. Lucky Cowboy. -Continue to monitor  Bacteremia due to Enterococcus Patient was found to have Enterococcus faecalis bacteremia, most likely secondary to UTI. TTE and TEE was negative for endocarditis.  Unable to rule out epidural abscess or discitis as unable to obtain MRI due to pacemaker.  Wife does not want him to go to [facility]. Repeat blood cultures done on 08/15/2022 remain negative. Infectious disease on board.  WBC scan was negative for discitis or osteomyelitis. -Continue with ampicillin, current plan is to continue for 6 weeks, patient will be  getting IV ampicillin while in the hospital and p.o. 1 g every 8 hourly on discharge.  AAA (abdominal aortic aneurysm) without rupture (HCC)-resolved as of 08/25/2022 S/p of AAA repair 2018 by Dr. Lucky Cowboy. Now the size has enlarged from 6.9 to 7.6 cm -consulted Dr. Lorenso Courier of [facility]  Addendum: Per Dr. Lorenso Courier, patient likely need surgical intervention. Pt will need CTA as soon as possible.  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 Candiss Norse.  He recommended to treat patient with IV fluid for few hours before doing CTA.  Patient is started on NS at 75/h. Will do CTA at three hours (14:00) after staring IVF. Pt will need to continue IVF until tomorrow AM after CTA is done.  -CTA of abd/pelvis is order (will be done at 14:00) -continue NS at 75cc/h -will hold [place]    Low back pain Lumbar OA, DDD Multiple lobar spine hardware This seems to be a chronic issue, has worsened recently. 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 There is osteopenia degenerative change of the lumbar spine with discogenic mild grade 1 retrolisthesis unchanged at L1-2 and L2-3, slight dextroscoliosis. Unable to obtain MRI due to pacemaker. Neurosurgery is recommending conservative management with brace and outpatient follow-up. -pain control: As needed Percocet, Tylenol -As needed Robaxin -lidoderm  COPD  exacerbation ([place]) Concern of some COPD exacerbation on admission with some wheezing which has been resolved.  No infiltrate on imaging but there was a concern of mucous plugging. Procalcitonin was elevated at 0.39. Patient received a course of steroids and antibiotics. Currently stable on room air. -Continue to monitor   Aneurysm of right common iliac artery (HCC)-resolved as of 08/25/2022 -f/u with [facility]  Mucus plugging of bronchi SOB w/ mucus plugging of bronchi noted on CT PE resolved on CTA (recently diagnosed) Pulmonary  was consulted and patient received MetaNeb with albuterol.  They also recommend incentive spirometry and bronchopulmonary hygiene.  Respiratory status improved. -Continue to monitor   Chronic kidney disease, stage IV (severe) (HCC) AKI improved.  Creatinine currently at baseline. -Continue to monitor -Avoid nephrotoxins  Myocardial injury-resolved as of 08/25/2022 -see above  Essential hypertension Blood pressure mildly elevated. Home Cozaar was switched with amlodipine. -Continue with amlodipine and metoprolol -Continue with as needed hydralazine  Chronic diastolic CHF (congestive heart failure) (HCC) 2D echo on 06/14/2022 showed EF of 60 to 65% with grade 1 diastolic dysfunction.  Patient does not have leg edema.  No pulm edema chest x-ray.  CHF seem to be compensated. -Continue to monitor  CAD S/P CABG x 3 Hx of CAD s/p of CABG and myocardial injury. Patient had mildly positive troponin with a flat curve most likely secondary to demand ischemia.  No chest pain. -Continue home Lipitor and Zetia.  HLD (hyperlipidemia) -Continue to Lipitor and zetia  Iron deficiency anemia Hemoglobin slowly trending down.  No active bleeding. 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 -s/p of RRL lobectomy and radiation therapy.  No acute issues.  Anxiety - Xanax 0.25 mg bid PRN  Weakness PT is recommending SNF. -TOC consult for placement  Obesity (BMI 30-39.9)-resolved as of 08/25/2022  BMI= 30.68  and BW= 99.8 -Diet and exercise.   -Encourage to lose weight.    Subjective: Patient was seen and examined today.  No new complaints.  Physical Exam: Vitals:   08/28/22 0429 08/28/22 0512 08/28/22 0759 08/28/22 1144  BP:  (!) 140/78 136/76 109/63   Pulse:  (!) 52 88 65  Resp:  16 19 18   Temp:   98.4 F (36.9 C) 98.7 F (37.1 C)  TempSrc:   Oral   SpO2:  98% 98% 98%  Weight: 90.1 kg     Height:       General.  Frail elderly gentleman, in no acute distress. Pulmonary.  Lungs clear bilaterally, normal respiratory effort. CV.  Regular rate and rhythm, no JVD, rub or murmur. Abdomen.  Soft, nontender, nondistended, BS positive. CNS.  Alert and oriented .  No focal neurologic deficit. Extremities.  No edema, no cyanosis, pulses intact and symmetrical. Psychiatry.  Judgment and insight appears normal.   Data Reviewed: Prior data reviewed  Family Communication: Discussed with wife at bedside  Disposition: Status is: Inpatient Remains inpatient appropriate because: Severity of illness   Planned Discharge Destination: Skilled nursing facility  DVT prophylaxis.  Subcu heparin Time spent: 43 minutes  This record has been created using Systems analyst. Errors have been sought and corrected,but may not always be located. Such creation errors do not reflect on the standard of care.  Author: Lorella Nimrod, MD 08/28/2022 2:27 PM  For  on call review [email or web address].

## 2022-08-29 DIAGNOSIS — B952 Enterococcus as the cause of diseases classified elsewhere: Secondary | ICD-10-CM | POA: Diagnosis not present

## 2022-08-29 DIAGNOSIS — R7881 Bacteremia: Secondary | ICD-10-CM | POA: Diagnosis not present

## 2022-08-29 DIAGNOSIS — M545 Low back pain, unspecified: Secondary | ICD-10-CM | POA: Diagnosis not present

## 2022-08-29 MED ORDER — POLYETHYLENE GLYCOL 3350 17 G PO PACK: 17.0000 g | PACK | Freq: Every day | ORAL | Status: DC | PRN

## 2022-08-29 MED ORDER — BENZONATATE 100 MG PO CAPS
200.0000 mg | ORAL_CAPSULE | Freq: Two times a day (BID) | ORAL | Status: DC
  Administered 2022-08-29 – 2022-09-01 (×7): 200 mg via ORAL
  Filled 2022-08-29 (×8): qty 2

## 2022-08-29 NOTE — Progress Notes (Signed)
Physical Therapy Treatment Patient Details Name: Rick Mcbride. MRN: [id number] DOB: 05-Oct-1941 Today's Date: 08/29/2022   History of Present Illness Rick Mcbride. is a 81 y.o. 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     Patient was recently hospitalized from 8/2 - 8/8 due to right femoral neck fracture.  Patient had surgery and finished the rehab.  Currently he is doing PT/OT at home.  He states that in the past several days, his lower back pain has worsened, difficult walking and doing physical therapy.  The lower back pain is constant, 7 out of 10 in severity, aching, nonradiating.  No loss control of bladder or bowel movement.  Denies new injury.  Patient also reports shortness of breath and dry cough.  No chest pain, fever or chills.  Denies nausea, vomiting, diarrhea or abdominal pain. Pt still has some mild right hip pain.  No symptoms of UTI. Pt had negative PE by V/Q scan which is ordered by his PCP yesterday.    PT Comments    Pt seen for PT tx with wife present for session; pt eager & willing to participate. Pt requires ongoing cuing re: technique & extremity positioning for rolling L<>R with HOB elevated & Bed rails. Pt is able to complete supine>sidelying>sitting with max assist. Pt with 1 successful STS from significantly elevated EOB (almost high perch position) with PT blocking 1 knee (would benefit from BLE knee blocking), cuing for technique & pt able to achieve upright standing even if <5 seconds before L knee buckling &  fatigue & Pt returned to sitting EOB. Pt completes lateral scoot bed>recliner on R with mod assist with ongoing education re: head/hips relationship & cuing for technique. Pt endorses feeling woozy once in chair but vitals WNL (see below). Will continue to follow pt acutely to address strengthening, activity tolerance, and transfers.    Recommendations for follow up therapy are one component of a multi-disciplinary discharge planning process, led by the attending physician.  Recommendations may be updated based on patient status, additional functional criteria and insurance authorization.  Follow Up Recommendations  Skilled nursing-short term rehab (<3 hours/day)     Assistance Recommended at Discharge Frequent or constant Supervision/Assistance  Patient can return home with the following Two people to help with walking and/or transfers;Two people to help with bathing/dressing/bathroom;Help with stairs or ramp for entrance;Assist for transportation;Assistance with cooking/housework   Equipment Recommendations  None recommended by PT    Recommendations for Other Services       Precautions / Restrictions Precautions Precautions: Fall;Back Restrictions Weight Bearing Restrictions: No     Mobility  Bed Mobility Overal bed mobility: Needs Assistance Bed Mobility: Rolling, Sidelying to Sit Rolling: Mod assist, Max assist (ongoing cuing re: BLE/BUE placement & technique) Sidelying to sit: Max assist (cuing to move BLE off of bed then upright trunk, pt with improved ability to push trunk to upright with bed rails, HOB elevated, assistance)            Transfers Overall transfer level: Needs assistance Equipment used: Rolling walker (2 wheels) Transfers: Sit to/from  Stand Sit to Stand: Mod assist          Lateral/Scoot Transfers: Mod assist General transfer comment: STS from significantly elevated EOB (almost high perch) with PT blocking R knee to prevent buckling (would benefit  from BLE knee blocking), max cuing for BUE hand placement to push to standing then transition UE to RW, cuing for upright posture & hip/glute activation with good return demo. Pt does exeperience L knee buckling & standing <5 seconds. During lateral scoot: Pt with improving ability to scoot L>R with PT providing education & multimodal cuing for head/hips relationship with good return demo from pt. Pt requires multiple scoots 2/2 fatigue, cuing & assistance for BLE foot placement.    Ambulation/Gait                   Stairs             Wheelchair Mobility    Modified Rankin (Stroke Patients Only)       Balance Overall balance assessment: Needs assistance Sitting-balance support: Bilateral upper extremity supported, Feet supported Sitting balance-Leahy Scale: Fair Sitting balance - Comments: min assist static sitting   Standing balance support: Bilateral upper extremity supported, Reliant on assistive device for balance Standing balance-Leahy Scale: Zero                              Cognition Arousal/Alertness: Awake/alert Behavior During Therapy: WFL for tasks assessed/performed Overall Cognitive Status: History of cognitive impairments - at baseline                                 General Comments: Follow simple commands during session, willing to participate.        Exercises      General Comments General comments (skin integrity, edema, etc.): Pt c/o feeling "woozy" once in recliner. SpO2 97% on room air, HR 73 bpm, SpO2 in RUE 142/65 mmHg MAP 90      Pertinent Vitals/Pain Pain Assessment Pain Assessment: Faces Faces Pain Scale: Hurts little more Pain Location: back during mobility Pain Descriptors / Indicators: Grimacing, Guarding Pain Intervention(s): Repositioned, Monitored during session, Limited activity within patient's tolerance    Home Living                          Prior Function            PT  Goals (current goals can now be found in the care plan section) Acute Rehab PT Goals Patient Stated Goal: Feel better PT Goal Formulation: With patient/family Time For Goal Achievement: 09/04/22 Potential to Achieve Goals: Fair Progress towards PT goals: Progressing toward goals    Frequency    Min 2X/week      PT Plan Current plan remains appropriate    Co-evaluation              AM-PAC PT "6 Clicks" Mobility   Outcome Measure  Help needed turning from your back to your side while in a flat bed without using bedrails?: A Lot Help needed moving from lying on your back to sitting on the side of a flat bed without using bedrails?: Total Help needed moving to and from a bed to a chair (including a wheelchair)?: A Lot Help needed standing up from a chair using your arms (e.g., wheelchair or bedside chair)?: Total Help needed to  walk in hospital room?: Total Help needed climbing 3-5 steps with a railing? : Total 6 Click Score: 8    End of Session Equipment Utilized During Treatment: Gait belt Activity Tolerance: Patient tolerated treatment well;Patient limited by fatigue Patient left: in chair;with chair alarm set;with call bell/phone within reach;with family/visitor present Nurse Communication: Mobility status PT Visit Diagnosis: Unsteadiness on feet (R26.81);Other abnormalities of gait and mobility (R26.89);Muscle weakness (generalized) (M62.81);Difficulty in walking, not elsewhere classified (R26.2);Pain Pain - part of body:  (back)     Time: 3437-3578 PT Time Calculation (min) (ACUTE ONLY): 20 min  Charges:  $Therapeutic Activity: 8-22 mins                     Lavone Nian, PT, DPT 08/29/22, 1:19 PM  Waunita Schooner 08/29/2022, 1:16 PM

## 2022-08-29 NOTE — TOC Progression Note (Signed)
Transition of Care (TOC) - Progression Note    Patient Details  Name: Rick Mcbride. MRN: [id number] Date of Birth: 02-24-1941  Transition of Care [facility]) CM/SW Contact  Zigmund Daniel Dorian Pod, RN Phone Number:[phone] 08/29/2022, 1:44 PM  Clinical Narrative:    [facility] RN attempted to provide information on available bed offers however unsuccessful. Pt on the phone via bedside and TOC RN only able to leave a voice message with the spouse contact requesting a call back. Will introduce available bed offers at that time for SNF placement and notify HTA.   TOC will continue to follow up with pt/fx on bed offers.   Expected Discharge Plan: [place] Barriers to Discharge: Continued Medical Work up  Expected Discharge Plan and Services Expected Discharge Plan: [place] Choice: [place] arrangements for the past 2 months: Single Family Home                                       Social Determinants of Health (SDOH) Interventions    Readmission Risk Interventions    06/13/2022    4:04 PM  Readmission Risk Prevention Plan  Transportation Screening Complete  PCP or Specialist Appt within 3-5 Days Complete  HRI or [place] Complete  Social Work Consult for [place] Planning/Counseling Complete  Palliative Care Screening Not Applicable  Medication Review [employer]) Complete

## 2022-08-29 NOTE — Progress Notes (Signed)
Progress Note   Patient: Rick Mcbride. JSE:[id number] DOB: [birth year] DOA: 08/14/2022     15 DOS: the patient was seen and examined on 08/29/2022   Brief hospital course: Rick Mcbride. is a 81 y.o. 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 Patient had surgery and finished SNF rehab. Currently he is doing PT/OT at home), chronic lower back pain (L5-S1 fusion), who presents with lower back pain and shortness breath. Pt had negative PE by V/Q scan which is ordered by his PCP day PTA, which was done for persistent SOB postoperatively. 10/07: WBC 3.8, negative COVID PCR, lactic acid 0.9, troponin level 31, 30, slightly worsening renal function with creatinine 2.69, BUN 31, GFR 23 (recent baseline creatinine 2.39 06/15/2022), temperature normal, blood pressure 172/84, 129/64, RR 83, heart rate 83, RR 17, oxygen saturation 98% on room air.  Chest x-ray showed possible mild patchy infiltration in left lower lobe.  CT head negative.  CT scan showed enlargement of AAA from previous 6.9 to 7.6 cm now.  Patient is admitted to telemetry bed as inpatient. Dr. Lorenso Courier of [facility] was consulted re: AAA, needs CTA, nephrology consulted and recommended IV fluids prior to CTA of Abd/Pelvis. Cardiology also consulted for surgical optimization/risk stratification - medically optimized at this time, troponin mild elevation likely d/t CKD4. BCx (+) E Faecalis, ID recommended r/o endocarditis/pacer infection w/ TEE and vascular surgery to assess re: graft involvement, changed Abx to ampicillin and repeat Bcx, also recommend MRI L-spine or CT  w/ contrast, pt has pacemaker   10/08: Pt and wife agreeable to TEE and MRI, though MRI would have to be at [facility]. Per vascular: "Noted bacteremia- however, patient with chronic progressive lower back pain; expanding aortic sac may be contributory to pain- not definitive. Therefore, preferable to intervene sooner than later." Per ID, would consider in order of importance to 1) treat bacteremia, 2) address aorto-bifem leak, 3) TEE, 4) MRI/CT L-spine. IF we decide to do CT, this can be done in-house, vs RI would require transfer. Will plan to proceed w/ IV abx, do vascular procedure tomorrow as scheduled, and see how he progresses from there.  10/09: Vascular procedure today to fix graft.  Radiology reached out this morning, concern for small PE noted on CTA abdomen/pelvis.  Holding off on full CTA chest at this time due to concern with contrast/renal function but will plan for heparin. 10/10: Renal function okay. CT w/ contrast lumbar spine - no osteomyelitis noted. CTA chest no PE but Significant mucus within RIGHT mainstem bronchus extending into bronchus intermedius and RIGHT lower lobe. Continue IV fluids and recheck BMP in AM. TEE planned for 10/12.  10/11-17: TEE done on 10/17 was negative for any endocarditis. Pulmonology was also consulted for concern of mucous plugging, has been resolved.6 renal function seems stable. Repeat blood cultures from 08/15/2022 remains negative, Unable to rule out epidural abscess or as still as MRI cannot be done due to pacemaker and wife refused to go to [facility] for MRI.  Will continue ampicillin for 6 weeks. Neurosurgery is recommending conservative management with brace.  Avoid NSAID due to CKD. PT/OT are recommending SNF.  10/18: Infectious disease ordered WBC scan to rule out epidural abscess or discitis.  Continued to have lower back pain.  10/19: Continue to have lower back pain.  Scheduled for WBC scan today.  Pending SNF placement-no bed offer  yet.  10/20: WBC scan was negative for any concern of discitis or osteomyelitis.  Still no bed offer. Infectious disease to determine the duration of antibiotics now. Patient was complaining of some worsening hoarseness of his voice for the past couple of days, no pain.  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  Advised to follow-up with ENT if he continues to have hoarseness.  Recently had his follow-up.  PET scan is due in December.  10/21: Remained hemodynamically stable.  ID recommendations remained for 6 weeks of ampicillin, until 09/27/2022.  We will continue IV while in the hospital and convert to p.o. amoxicillin 1 g Q8 hourly on discharge.  10/22: Patient with little worsening of the cough and multiple bowel movements with bowel regimen.  Added Tessalon and made MiraLAX as as needed instead of twice daily.  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 S/p repair on 08/16/2022 by vascular surgery. Patient had AAA repair done in 2018 by Dr. Lucky Cowboy. -Continue to monitor  Bacteremia due to Enterococcus Patient was found to have Enterococcus faecalis bacteremia, most likely secondary to UTI. TTE and TEE was negative for endocarditis.  Unable to rule out epidural abscess or discitis as unable to obtain MRI due to pacemaker.  Wife does not want him to go to [facility]. Repeat blood cultures done on 08/15/2022 remain  negative. Infectious disease on board.  WBC scan was negative for discitis or osteomyelitis. -Continue with ampicillin, current plan is to continue for 6 weeks, patient will be getting IV ampicillin while in the hospital and p.o. 1 g every 8 hourly on discharge.  AAA (abdominal aortic aneurysm) without rupture (HCC)-resolved as of 08/25/2022 S/p of AAA repair 2018 by Dr. Lucky Cowboy. Now the size has enlarged from 6.9 to 7.6 cm -consulted Dr. Lorenso Courier of [facility]  Addendum: Per Dr. Lorenso Courier, patient likely need surgical intervention. Pt will need CTA as soon as possible.  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 Candiss Norse.  He recommended to treat patient with IV fluid for few hours before doing CTA.  Patient is started on NS at 75/h. Will do CTA at three hours (14:00) after staring IVF. Pt will need to continue IVF until tomorrow AM after CTA is done.  -CTA of abd/pelvis is order (will be done at 14:00) -continue NS at 75cc/h -will hold [place]    Low back pain Lumbar OA, DDD Multiple lobar spine hardware This seems to be a chronic issue, has worsened recently. 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 There is osteopenia degenerative change of the lumbar spine with discogenic mild grade 1 retrolisthesis unchanged at L1-2 and L2-3, slight dextroscoliosis.  Unable to obtain MRI due to pacemaker. Neurosurgery is recommending conservative management with brace and outpatient follow-up. -pain control: As needed Percocet, Tylenol -As needed Robaxin -lidoderm  COPD exacerbation ([place]) Concern of some COPD exacerbation on admission with some wheezing which has been resolved.  No infiltrate on imaging but there was a concern of mucous plugging. Procalcitonin was elevated at 0.39. Patient received a course of steroids and antibiotics. Currently stable on room air. -Continue to monitor   Aneurysm of right common iliac  artery (HCC)-resolved as of 08/25/2022 -f/u with [facility]  Mucus plugging of bronchi SOB w/ mucus plugging of bronchi noted on CT PE resolved on CTA (recently diagnosed) Pulmonary was consulted and patient received MetaNeb with albuterol.  They also recommend incentive spirometry and bronchopulmonary hygiene.  Respiratory status improved. -Continue to monitor   Chronic kidney disease, stage IV (severe) (HCC) AKI improved.  Creatinine currently at baseline. -Continue to monitor -Avoid nephrotoxins  Myocardial injury-resolved as of 08/25/2022 -see above  Essential hypertension Blood pressure mildly elevated. Home Cozaar was switched with amlodipine. -Continue with amlodipine and metoprolol -Continue with as needed hydralazine  Chronic diastolic CHF (congestive heart failure) (HCC) 2D echo on 06/14/2022 showed EF of 60 to 65% with grade 1 diastolic dysfunction.  Patient does not have leg edema.  No pulm edema chest x-ray.  CHF seem to be compensated. -Continue to monitor  CAD S/P CABG x 3 Hx of CAD s/p of CABG and myocardial injury. Patient had mildly positive troponin with a flat curve most likely secondary to demand ischemia.  No chest pain. -Continue home Lipitor and Zetia.  HLD (hyperlipidemia) -Continue to Lipitor and zetia  Iron deficiency anemia Hemoglobin slowly trending down.  No active bleeding. 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 -s/p of RRL lobectomy and radiation therapy.  No acute issues.  Anxiety - Xanax 0.25 mg bid PRN  Weakness PT is recommending SNF. -TOC consult for placement  Obesity (BMI 30-39.9)-resolved as of 08/25/2022  BMI= 30.68  and BW= 99.8 -Diet and exercise.   -Encourage to lose weight.    Subjective:  Patient was seen and examined today.  Wife was concerned about worsening cough which is causing more pain in his back.  He also had multiple soft bowel movements, no watery diarrhea or abdominal pain.  Physical Exam: Vitals:   08/28/22 2014 08/29/22 0422 08/29/22 0821 08/29/22 1156  BP: 134/69 132/60 130/63 (!) 113/59  Pulse: 88 96 88 83  Resp: 18 (!) 22 18 18   Temp: 98.2 F (36.8 C) 98.2 F (36.8 C) 98.1 F (36.7 C) 98.1 F (36.7 C)  TempSrc: Oral Oral    SpO2: 97% 100% 98% 97%  Weight:      Height:       General.  Frail elderly man, in no acute distress. Pulmonary.  Lungs clear bilaterally, normal respiratory effort. CV.  Regular rate and rhythm, no JVD, rub or murmur. Abdomen.  Soft, nontender, nondistended, BS positive. CNS.  Alert and oriented .  No focal neurologic deficit. Extremities.  No edema, no cyanosis, pulses intact and symmetrical. Psychiatry.  Judgment and insight appears normal.   Data Reviewed: Prior data reviewed  Family Communication: Discussed with wife at bedside  Disposition: Status is: Inpatient Remains inpatient appropriate because: Severity of illness  Planned Discharge Destination: Skilled nursing facility  DVT prophylaxis.  Subcu heparin Time spent: 44 minutes  This record has been created using Systems analyst. Errors have been sought and corrected,but may not always be located. Such creation errors do not reflect on the standard of care.  Author: Lorella Nimrod, MD 08/29/2022 3:25 PM  For on call review [email or web address].

## 2022-08-30 DIAGNOSIS — T829XXA Unspecified complication of cardiac and vascular prosthetic device, implant and graft, initial encounter: Secondary | ICD-10-CM | POA: Diagnosis not present

## 2022-08-30 DIAGNOSIS — N184 Chronic kidney disease, stage 4 (severe): Secondary | ICD-10-CM | POA: Diagnosis not present

## 2022-08-30 DIAGNOSIS — R7881 Bacteremia: Secondary | ICD-10-CM | POA: Diagnosis not present

## 2022-08-30 DIAGNOSIS — B952 Enterococcus as the cause of diseases classified elsewhere: Secondary | ICD-10-CM | POA: Diagnosis not present

## 2022-08-30 MED ORDER — POLYETHYLENE GLYCOL 3350 17 G PO PACK
17.0000 g | PACK | Freq: Two times a day (BID) | ORAL | Status: DC
  Administered 2022-08-30: 17 g via ORAL
  Filled 2022-08-30 (×5): qty 1

## 2022-08-30 MED ORDER — SENNOSIDES-DOCUSATE SODIUM 8.6-50 MG PO TABS: 2.0000 | ORAL_TABLET | Freq: Every evening | ORAL | Status: DC | PRN

## 2022-08-30 NOTE — Plan of Care (Signed)
  Problem: Education: Goal: Knowledge of disease or condition will improve Outcome: Progressing Goal: Knowledge of the prescribed therapeutic regimen will improve Outcome: Progressing Goal: Individualized Educational Video(s) Outcome: Progressing   Problem: Activity: Goal: Ability to tolerate increased activity will improve Outcome: Progressing Goal: Will verbalize the importance of balancing activity with adequate rest periods Outcome: Progressing   Problem: Respiratory: Goal: Ability to maintain a clear airway will improve Outcome: Progressing Goal: Levels of oxygenation will improve Outcome: Progressing Goal: Ability to maintain adequate ventilation will improve Outcome: Progressing   Problem: Activity: Goal: Ability to tolerate increased activity will improve Outcome: Progressing   Problem: Clinical Measurements: Goal: Ability to maintain a body temperature in the normal range will improve Outcome: Progressing   Problem: Respiratory: Goal: Ability to maintain adequate ventilation will improve Outcome: Progressing Goal: Ability to maintain a clear airway will improve Outcome: Progressing   Problem: Education: Goal: Knowledge of General Education information will improve Description: Including pain rating scale, medication(s)/side effects and non-pharmacologic comfort measures Outcome: Progressing   Problem: Health Behavior/Discharge Planning: Goal: Ability to manage health-related needs will improve Outcome: Progressing   Problem: Clinical Measurements: Goal: Ability to maintain clinical measurements within normal limits will improve Outcome: Progressing Goal: Will remain free from infection Outcome: Progressing Goal: Diagnostic test results will improve Outcome: Progressing Goal: Respiratory complications will improve Outcome: Progressing Goal: Cardiovascular complication will be avoided Outcome: Progressing   Problem: Activity: Goal: Risk for activity  intolerance will decrease Outcome: Progressing   Problem: Nutrition: Goal: Adequate nutrition will be maintained Outcome: Progressing   Problem: Coping: Goal: Level of anxiety will decrease Outcome: Progressing   Problem: Elimination: Goal: Will not experience complications related to bowel motility Outcome: Progressing Goal: Will not experience complications related to urinary retention Outcome: Progressing   Problem: Pain Managment: Goal: General experience of comfort will improve Outcome: Progressing   Problem: Safety: Goal: Ability to remain free from injury will improve Outcome: Progressing   Problem: Skin Integrity: Goal: Risk for impaired skin integrity will decrease Outcome: Progressing   Problem: Education: Goal: Knowledge of General Education information will improve Description: Including pain rating scale, medication(s)/side effects and non-pharmacologic comfort measures Outcome: Progressing   Problem: Health Behavior/Discharge Planning: Goal: Ability to manage health-related needs will improve Outcome: Progressing   Problem: Clinical Measurements: Goal: Ability to maintain clinical measurements within normal limits will improve Outcome: Progressing Goal: Will remain free from infection Outcome: Progressing Goal: Diagnostic test results will improve Outcome: Progressing Goal: Respiratory complications will improve Outcome: Progressing Goal: Cardiovascular complication will be avoided Outcome: Progressing   Problem: Activity: Goal: Risk for activity intolerance will decrease Outcome: Progressing   Problem: Nutrition: Goal: Adequate nutrition will be maintained Outcome: Progressing   Problem: Coping: Goal: Level of anxiety will decrease Outcome: Progressing   Problem: Elimination: Goal: Will not experience complications related to bowel motility Outcome: Progressing Goal: Will not experience complications related to urinary  retention Outcome: Progressing   Problem: Pain Managment: Goal: General experience of comfort will improve Outcome: Progressing   Problem: Safety: Goal: Ability to remain free from injury will improve Outcome: Progressing   Problem: Skin Integrity: Goal: Risk for impaired skin integrity will decrease Outcome: Progressing

## 2022-08-30 NOTE — Progress Notes (Signed)
ID Back pain has slightly improved Hoarse voice and cough better   O/e Awake and alert Patient Vitals for the past 24 hrs:  BP Temp Temp src Pulse Resp SpO2 Weight  08/30/22 1225 138/77 97.8 F (36.6 C) Oral 84 16 98 % --  08/30/22 0758 134/66 98.6 F (37 C) Oral 79 16 98 % --  08/30/22 0500 -- -- -- -- -- -- 88.5 kg  08/30/22 0341 124/67 98.1 F (36.7 C) Oral 85 17 96 % --  08/29/22 1952 (!) 143/64 98.2 F (36.8 C) -- 75 16 100 % --  08/29/22 1535 108/62 97.6 F (36.4 C) -- 76 20 99 % --   Chest b/l air entry Few basal crepts [id number] Pacemaker Abd soft CNS moves all extremities  Labs    Latest Ref Rng & Units 08/26/2022    4:50 AM 08/24/2022    6:04 AM 08/22/2022    5:19 AM  CBC  WBC 4.0 - 10.5 K/uL 8.2  8.7  10.6   Hemoglobin 13.0 - 17.0 g/dL 8.2  7.2  7.9   Hematocrit 39.0 - 52.0 % 26.6  23.8  25.0   Platelets 150 - 400 K/uL 275  217  221        Latest Ref Rng & Units 08/26/2022    4:50 AM 08/24/2022    6:04 AM 08/22/2022    5:19 AM  CMP  Glucose 70 - 99 mg/dL 87  101  117   BUN 8 - 23 mg/dL 28  35  32   Creatinine 0.61 - 1.24 mg/dL 1.85  1.88  1.99   Sodium 135 - 145 mmol/L 135  138  138   Potassium 3.5 - 5.1 mmol/L 3.6  3.7  3.5   Chloride 98 - 111 mmol/L 104  108  109   CO2 22 - 32 mmol/L 22  23  21    Calcium 8.9 - 10.3 mg/dL 8.4  8.6  8.4     Micro BC- 08/14/22 Enterococcis UC - enterococcus 08/15/22 BC-NG   Impression/recommendation  Severe Low back pain- has previous L5-s1 fusion  discitis. Epidural abscess or osteo cannot be ruled out as MRI cannot be done due to pacemaker. CT scan did not show any infection Tagged WBC scan did not show any focus in the lumbar spine HE will get PET scan as OP ESR > 100 With underlying maliganncy it could be high- also high in vertebral infection  Enterococcus bacteremia-  TEE neg for endocarditis UC positive for enterococcus Repeat blood culture neg He is on ampicillin  day 17  Will need total of 6  weeks or more  10/05/22 Continue IV while in hospital- on discharge can be switched to Po amoxicillin 1 gram Q 8   AAA- s/p endovascular stent- worsening aneurysm with endo leak 2. S/p intervention  wth coiling of lumbar arteries   Small left PE   SA nodal disease- s/p pacemaker- lead in the left ventricle after going thru the interatrial septal opening TEE neg for endocarditis   Recent fall and fracture rt femur, s/p bipolar arthroplasty of the rt hip   CKD   Anemia   Multiple malignancies- SCC rt LL lung s/p lobectomy SCC tongue s/p radiation  Ca prostate s/p prostatectomy   CAD s/p CABG      Mild cognitive impairment- on aricept   Discussed the management with patient and wife Will follow him as OP

## 2022-08-30 NOTE — Progress Notes (Addendum)
Attempt to reach patient's wife to discuss discharge plan, no answer. Left a message.   4:25pm Spoke with patient's wife at length regarding current bed offers. She was presented with offers for  [facility], [place] [facility], [place] [facility], [facility], and [facility]. She also provided with location of each facility. She no longer wishes to consider [facility]. She plans to review each facility with her daughter and follow up with [facility] tomorrow.  She has been advised a decision would need to be made regarding the bed offer so the auth could be obtained. She has been made aware per MD note 10/23 the patient is medically stable for discharge.

## 2022-08-30 NOTE — Progress Notes (Signed)
Progress Note   Patient: Rick Mcbride. HFW:[id number] DOB: 08-24-1941 DOA: 08/14/2022     16 DOS: the patient was seen and examined on 08/30/2022   Brief hospital course: Rick T Kendarius Vigen. is a 81 y.o. 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 Patient had surgery and finished SNF rehab. Currently he is doing PT/OT at home), chronic lower back pain (L5-S1 fusion), who presents with lower back pain and shortness breath. Pt had negative PE by V/Q scan which is ordered by his PCP day PTA, which was done for persistent SOB postoperatively. 10/07: WBC 3.8, negative COVID PCR, lactic acid 0.9, troponin level 31, 30, slightly worsening renal function with creatinine 2.69, BUN 31, GFR 23 (recent baseline creatinine 2.39 06/15/2022), temperature normal, blood pressure 172/84, 129/64, RR 83, heart rate 83, RR 17, oxygen saturation 98% on room air.  Chest x-ray showed possible mild patchy infiltration in left lower lobe.  CT head negative.  CT scan showed enlargement of AAA from previous 6.9 to 7.6 cm now.  Patient is admitted to telemetry bed as inpatient. Dr. Lorenso Courier of [facility] was consulted re: AAA, needs CTA, nephrology consulted and recommended IV fluids prior to CTA of Abd/Pelvis. Cardiology also consulted for surgical optimization/risk stratification - medically optimized at this time, troponin mild elevation likely d/t CKD4. BCx (+) E Faecalis, ID recommended r/o endocarditis/pacer infection w/ TEE and vascular surgery to assess re: graft involvement, changed Abx to ampicillin and repeat Bcx, also recommend MRI L-spine or CT  w/ contrast, pt has pacemaker   10/08: Pt and wife agreeable to TEE and MRI, though MRI would have to be at [facility]. Per vascular: "Noted bacteremia- however, patient with chronic progressive lower back pain; expanding aortic sac may be contributory to pain- not definitive. Therefore, preferable to intervene sooner than later." Per ID, would consider in order of importance to 1) treat bacteremia, 2) address aorto-bifem leak, 3) TEE, 4) MRI/CT L-spine. IF we decide to do CT, this can be done in-house, vs RI would require transfer. Will plan to proceed w/ IV abx, do vascular procedure tomorrow as scheduled, and see how he progresses from there.  10/09: Vascular procedure today to fix graft.  Radiology reached out this morning, concern for small PE noted on CTA abdomen/pelvis.  Holding off on full CTA chest at this time due to concern with contrast/renal function but will plan for heparin. 10/10: Renal function okay. CT w/ contrast lumbar spine - no osteomyelitis noted. CTA chest no PE but Significant mucus within RIGHT mainstem bronchus extending into bronchus intermedius and RIGHT lower lobe. Continue IV fluids and recheck BMP in AM. TEE planned for 10/12.  10/11-17: TEE done on 10/17 was negative for any endocarditis. Pulmonology was also consulted for concern of mucous plugging, has been resolved.6 renal function seems stable. Repeat blood cultures from 08/15/2022 remains negative, Unable to rule out epidural abscess or as still as MRI cannot be done due to pacemaker and wife refused to go to [facility] for MRI.  Will continue ampicillin for 6 weeks. Neurosurgery is recommending conservative management with brace.  Avoid NSAID due to CKD. PT/OT are recommending SNF.  10/18: Infectious disease ordered WBC scan to rule out epidural abscess or discitis.  Continued to have lower back pain.  10/19: Continue to have lower back pain.  Scheduled for WBC scan today.  Pending SNF placement-no bed offer  yet.  10/20: WBC scan was negative for any concern of discitis or osteomyelitis.  Still no bed offer. Infectious disease to determine the duration of antibiotics now. Patient was complaining of some worsening hoarseness of his voice for the past couple of days, no pain.  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  Advised to follow-up with ENT if he continues to have hoarseness.  Recently had his follow-up.  PET scan is due in December.  10/21: Remained hemodynamically stable.  ID recommendations remained for 6 weeks of ampicillin, until 09/27/2022.  We will continue IV while in the hospital and convert to p.o. amoxicillin 1 g Q8 hourly on discharge.  10/22: Patient with little worsening of the cough and multiple bowel movements with bowel regimen.  Added Tessalon and made MiraLAX as as needed instead of twice daily.  10/23: Medically stable.  No new concern.  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 S/p repair on 08/16/2022 by vascular surgery. Patient had AAA repair done in 2018 by Dr. Lucky Cowboy. -Continue to monitor  Bacteremia due to Enterococcus Patient was found to have Enterococcus faecalis bacteremia, most likely secondary to UTI. TTE and TEE was negative for endocarditis.  Unable to rule out epidural abscess or discitis as unable to obtain MRI due to pacemaker.  Wife does not want him to go  to [facility]. Repeat blood cultures done on 08/15/2022 remain negative. Infectious disease on board.  WBC scan was negative for discitis or osteomyelitis. -Continue with ampicillin, current plan is to continue for 6 weeks, patient will be getting IV ampicillin while in the hospital and p.o. 1 g every 8 hourly on discharge.  AAA (abdominal aortic aneurysm) without rupture (HCC)-resolved as of 08/25/2022 S/p of AAA repair 2018 by Dr. Lucky Cowboy. Now the size has enlarged from 6.9 to 7.6 cm -consulted Dr. Lorenso Courier of [facility]  Addendum: Per Dr. Lorenso Courier, patient likely need surgical intervention. Pt will need CTA as soon as possible.  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 Candiss Norse.  He recommended to treat patient with IV fluid for few hours before doing CTA.  Patient is started on NS at 75/h. Will do CTA at three hours (14:00) after staring IVF. Pt will need to continue IVF until tomorrow AM after CTA is done.  -CTA of abd/pelvis is order (will be done at 14:00) -continue NS at 75cc/h -will hold [place]    Low back pain Lumbar OA, DDD Multiple lobar spine hardware This seems to be a chronic issue, has worsened recently. 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 There is osteopenia degenerative change of the lumbar spine with  discogenic mild grade 1 retrolisthesis unchanged at L1-2 and L2-3, slight dextroscoliosis. Unable to obtain MRI due to pacemaker. Neurosurgery is recommending conservative management with brace and outpatient follow-up. -pain control: As needed Percocet, Tylenol -As needed Robaxin -lidoderm  COPD exacerbation ([place]) Concern of some COPD exacerbation on admission with some wheezing which has been resolved.  No infiltrate on imaging but there was a concern of mucous plugging. Procalcitonin was elevated at 0.39. Patient received a course of steroids and antibiotics. Currently stable on room  air. -Continue to monitor   Aneurysm of right common iliac artery (HCC)-resolved as of 08/25/2022 -f/u with [facility]  Mucus plugging of bronchi SOB w/ mucus plugging of bronchi noted on CT PE resolved on CTA (recently diagnosed) Pulmonary was consulted and patient received MetaNeb with albuterol.  They also recommend incentive spirometry and bronchopulmonary hygiene.  Respiratory status improved. -Continue to monitor   Chronic kidney disease, stage IV (severe) (HCC) AKI improved.  Creatinine currently at baseline. -Continue to monitor -Avoid nephrotoxins  Myocardial injury-resolved as of 08/25/2022 -see above  Essential hypertension Blood pressure mildly elevated. Home Cozaar was switched with amlodipine. -Continue with amlodipine and metoprolol -Continue with as needed hydralazine  Chronic diastolic CHF (congestive heart failure) (HCC) 2D echo on 06/14/2022 showed EF of 60 to 65% with grade 1 diastolic dysfunction.  Patient does not have leg edema.  No pulm edema chest x-ray.  CHF seem to be compensated. -Continue to monitor  CAD S/P CABG x 3 Hx of CAD s/p of CABG and myocardial injury. Patient had mildly positive troponin with a flat curve most likely secondary to demand ischemia.  No chest pain. -Continue home Lipitor and Zetia.  HLD (hyperlipidemia) -Continue to Lipitor and zetia  Iron deficiency anemia Hemoglobin slowly trending down.  No active bleeding. 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 -s/p of RRL lobectomy and radiation therapy.  No acute issues.  Anxiety - Xanax 0.25 mg bid PRN  Weakness PT is recommending SNF. -TOC consult for placement  Obesity (BMI 30-39.9)-resolved as of 08/25/2022  BMI= 30.68  and BW= 99.8 -Diet  and exercise.   -Encourage to lose weight.    Subjective: Patient was seen and examined today.  No new complaints.  Physical Exam: Vitals:   08/30/22 0341 08/30/22 0500 08/30/22 0758 08/30/22 1225  BP: 124/67  134/66 138/77  Pulse: 85  79 84  Resp: 17  16 16   Temp: 98.1 F (36.7 C)  98.6 F (37 C) 97.8 F (36.6 C)  TempSrc: Oral  Oral Oral  SpO2: 96%  98% 98%  Weight:  88.5 kg    Height:       General.  Frail elderly man, in no acute distress. Pulmonary.  Lungs clear bilaterally, normal respiratory effort. CV.  Regular rate and rhythm, no JVD, rub or murmur. Abdomen.  Soft, nontender, nondistended, BS positive. CNS.  Alert and oriented .  No focal neurologic deficit. Extremities.  No edema, no cyanosis, pulses intact and symmetrical. Psychiatry.  Judgment and insight appears normal.    Data Reviewed: Prior data reviewed  Family Communication: Discussed with wife at bedside  Disposition: Status is: Inpatient Remains inpatient appropriate because: Severity of illness   Planned Discharge Destination: Skilled nursing facility  DVT prophylaxis.  Subcu heparin Time spent: 40 minutes  This record has been created using Systems analyst. Errors have been sought and corrected,but may not always be located. Such creation errors do not reflect on the standard of care.  Author: Lorella Nimrod, MD 08/30/2022 3:09 PM  For on call review [email or web address].

## 2022-08-30 NOTE — Care Management Important Message (Signed)
Important Message  Patient Details  Name: Rick Mcbride. MRN: [id number] Date of Birth: [birth year]   Medicare Important Message Given:  Yes     Dannette Barbara 08/30/2022, 3:44 PM

## 2022-08-31 DIAGNOSIS — N184 Chronic kidney disease, stage 4 (severe): Secondary | ICD-10-CM | POA: Diagnosis not present

## 2022-08-31 DIAGNOSIS — M545 Low back pain, unspecified: Secondary | ICD-10-CM | POA: Diagnosis not present

## 2022-08-31 DIAGNOSIS — B952 Enterococcus as the cause of diseases classified elsewhere: Secondary | ICD-10-CM | POA: Diagnosis not present

## 2022-08-31 DIAGNOSIS — T829XXA Unspecified complication of cardiac and vascular prosthetic device, implant and graft, initial encounter: Secondary | ICD-10-CM | POA: Diagnosis not present

## 2022-08-31 DIAGNOSIS — R7881 Bacteremia: Secondary | ICD-10-CM | POA: Diagnosis not present

## 2022-08-31 MED ORDER — AMOXICILLIN 500 MG PO CAPS: 1000.0000 mg | ORAL_CAPSULE | Freq: Three times a day (TID) | ORAL | Status: DC

## 2022-08-31 MED ORDER — AMOXICILLIN 500 MG PO CAPS
1000.0000 mg | ORAL_CAPSULE | Freq: Three times a day (TID) | ORAL | Status: DC
  Administered 2022-08-31 – 2022-09-01 (×3): 1000 mg via ORAL
  Filled 2022-08-31 (×3): qty 2

## 2022-08-31 NOTE — TOC Progression Note (Signed)
Transition of Care (TOC) - Progression Note    Patient Details  Name: Rick Mcbride. MRN: [id number] Date of Birth: [birth year]  Transition of Care [facility]) CM/SW [place], [place] Phone Number: 08/31/2022, 12:19 PM  Clinical Narrative:     Patient's spouse chose [facility], insurance auth started with [facility]. Pending auth at this time.   Expected Discharge Plan: [place] Barriers to Discharge: Continued Medical Work up  Expected Discharge Plan and Services Expected Discharge Plan: [place] Choice: [place] arrangements for the past 2 months: Single Family Home                                       Social Determinants of Health (SDOH) Interventions    Readmission Risk Interventions    06/13/2022    4:04 PM  Readmission Risk Prevention Plan  Transportation Screening Complete  PCP or Specialist Appt within 3-5 Days Complete  HRI or [place] Complete  Social Work Consult for [place] Planning/Counseling Complete  Palliative Care Screening Not Applicable  Medication Review Press photographer) Complete

## 2022-08-31 NOTE — Progress Notes (Signed)
Occupational Therapy Treatment Patient Details Name: Rick Mcbride. MRN: [id number] DOB: 1941/10/10 Today's Date: 08/31/2022   History of present illness Rick Mcbride. is a 81 y.o. 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     Patient was recently hospitalized from 8/2 - 8/8 due to right femoral neck fracture.  Patient had surgery and finished the rehab.  Currently he is doing PT/OT at home.  He states that in the past several days, his lower back pain has worsened, difficult walking and doing physical therapy.  The lower back pain is constant, 7 out of 10 in severity, aching, nonradiating.  No loss control of bladder or bowel movement.  Denies new injury.  Patient also reports shortness of breath and dry cough.  No chest pain, fever or chills.  Denies nausea, vomiting, diarrhea or abdominal pain. Pt still has some mild right hip pain.  No symptoms of UTI. Pt had negative PE by V/Q scan which is ordered by his PCP yesterday.   OT comments  Pt received semi-reclined in bed; wife in room. Appearing alert; willing to work with OT on transfer to chair; deferred ADLs, as pt had already completed them. X2 today for safe transfer. See flowsheet below for further details of session. Left seated in recliner; BIL LE elevated; pillows positioned for comfort; wife in room; pt with all needs in reach. O2 checked at end of transfer: 93-94%.    Recommendations for follow up therapy are one component of a multi-disciplinary discharge planning process, led  by the attending physician.  Recommendations may be updated based on patient status, additional functional criteria and insurance authorization.    Follow Up Recommendations  Skilled nursing-short term rehab (<3 hours/day)    Assistance Recommended at Discharge Frequent or constant Supervision/Assistance  Patient can return home with the following  Two people to help with walking and/or transfers;A lot of help with bathing/dressing/bathroom;Assistance with cooking/housework;Direct supervision/assist for financial management;Assist for transportation;Help with stairs or ramp for entrance;Direct supervision/assist for medications management   Equipment Recommendations  Other (comment) (defer to next venue of care)    Recommendations for Other Services      Precautions / Restrictions Precautions Precautions: Fall;Back Precaution Booklet Issued: No Restrictions Weight Bearing Restrictions: No       Mobility Bed Mobility Overal bed mobility: Needs Assistance Bed Mobility: Rolling, Sidelying to Sit Rolling: Mod assist Sidelying to sit: Mod assist       General bed mobility comments: VC for sequencing to improve technique, pain limited    Transfers Overall transfer level: Needs assistance Equipment used: Rolling walker (2 wheels) Transfers: Sit to/from Stand Sit to Stand: +2 physical assistance, From elevated surface (MIN x2; very elevated bed) Stand pivot transfers: +2 physical assistance, From elevated surface (using RW, and pt reaching with R hand for the arm rest of the chair)         General transfer comment: Significant low back pain with standing. Pt able to stand total of 3 times this session, including standing to t/f to recliner.     Balance  ADL either performed or assessed with clinical judgement   ADL Overall ADL's : Needs assistance/impaired                                        General ADL Comments: Pt declined ADLs this morning, as pt and wife have already completed them this morning (although wife stating pt needs to shave- no razor available; OT encouraged wife to bring an Copy). Pt using BIL UE functionally today to push through bed for scooting forward, using RW for standing, and reaching during t/f to recliner next to bed. Pt required dependent assist while standing briefly for bowel incontinence hygiene.    Extremity/Trunk Assessment Upper Extremity Assessment Upper Extremity Assessment: Overall WFL for tasks assessed   Lower Extremity Assessment Lower Extremity Assessment: Defer to PT evaluation        Vision       Perception     Praxis      Cognition Arousal/Alertness: Awake/alert Behavior During Therapy: WFL for tasks assessed/performed Overall Cognitive Status: Within Functional Limits for tasks assessed                                 General Comments: Follow simple commands during session, willing to participate.        Exercises      Shoulder Instructions       General Comments      Pertinent Vitals/ Pain       Pain Assessment Pain Assessment: 0-10 Pain Score: 6  Pain Location: back during mobility and sitting up Pain Descriptors / Indicators: Aching Pain Intervention(s): Premedicated before session, Limited activity within patient's tolerance, Monitored during session, Relaxation  Home Living                                          Prior Functioning/Environment              Frequency  Min 2X/week        Progress Toward Goals  OT Goals(current goals can now be found in the care plan section)  Progress towards OT goals: Progressing toward goals  Acute Rehab OT Goals Patient Stated Goal: Get better; decrease pain OT Goal Formulation: With patient/family Time For Goal Achievement: 09/04/22 Potential to Achieve Goals: Fair ADL Goals Pt Will Perform Grooming: with  supervision;sitting Pt Will Perform Upper Body Dressing: with min assist;sitting Pt Will Perform Lower Body Dressing: with min assist;sit to/from stand;with adaptive equipment;with caregiver independent in assisting Pt Will Transfer to Toilet: with min assist;stand pivot transfer;bedside commode Pt Will Perform Toileting - Clothing Manipulation and hygiene: with min assist;with caregiver independent in assisting;with adaptive equipment;sit to/from stand  Plan Discharge plan remains appropriate;Frequency remains appropriate    Co-evaluation    PT/OT/SLP Co-Evaluation/Treatment: Yes Reason for Co-Treatment: Complexity of the patient's impairments (multi-system involvement);For patient/therapist safety;To address functional/ADL transfers   OT goals addressed during session: Strengthening/ROM;Other (comment) (Functional transfers)      AM-PAC OT "6 Clicks" Daily Activity     Outcome Measure   Help from another person eating meals?: A Little Help from another person taking care of personal grooming?: A Little Help from another person toileting, which includes using toliet, bedpan, or urinal?: Total Help from another  person bathing (including washing, rinsing, drying)?: A Lot Help from another person to put on and taking off regular upper body clothing?: A Little Help from another person to put on and taking off regular lower body clothing?: A Lot 6 Click Score: 14    End of Session Equipment Utilized During Treatment: Gait belt;Rolling walker (2 wheels)  OT Visit Diagnosis: Other abnormalities of gait and mobility (R26.89);Muscle weakness (generalized) (M62.81);Pain;Other symptoms and signs involving cognitive function   Activity Tolerance Patient limited by pain   Patient Left in chair;with family/visitor present   Nurse Communication Mobility status        Time: 1275-1700 OT Time Calculation (min): 23 min  Charges: OT General Charges $OT Visit: 1 Visit OT  Treatments $Therapeutic Activity: 8-22 mins  Waymon Amato, MS, OTR/L   Vania Rea 08/31/2022, 12:09 PM

## 2022-08-31 NOTE — Progress Notes (Signed)
ID Pt sat in chair for a few hours   O/e Awake and alert Patient Vitals for the past 24 hrs:  BP Temp Temp src Pulse Resp SpO2 Weight  08/31/22 1617 115/70 98.1 F (36.7 C) Oral 80 20 97 % --  08/31/22 1139 111/67 98 F (36.7 C) Oral 86 18 100 % --  08/31/22 0746 118/74 99.1 F (37.3 C) Oral 92 14 99 % --  08/31/22 0526 -- -- -- -- -- -- 83.9 kg  08/31/22 0342 125/75 97.7 F (36.5 C) Oral 93 16 100 % --  08/30/22 2345 117/79 98.5 F (36.9 C) -- 91 16 97 % --  08/30/22 2013 124/71 98.3 F (36.8 C) -- 77 (!) 21 99 % --   Chest b/l air entry [id number] Pacemaker Abd soft CNS moves all extremities  Labs    Latest Ref Rng & Units 08/26/2022    4:50 AM 08/24/2022    6:04 AM 08/22/2022    5:19 AM  CBC  WBC 4.0 - 10.5 K/uL 8.2  8.7  10.6   Hemoglobin 13.0 - 17.0 g/dL 8.2  7.2  7.9   Hematocrit 39.0 - 52.0 % 26.6  23.8  25.0   Platelets 150 - 400 K/uL 275  217  221        Latest Ref Rng & Units 08/26/2022    4:50 AM 08/24/2022    6:04 AM 08/22/2022    5:19 AM  CMP  Glucose 70 - 99 mg/dL 87  101  117   BUN 8 - 23 mg/dL 28  35  32   Creatinine 0.61 - 1.24 mg/dL 1.85  1.88  1.99   Sodium 135 - 145 mmol/L 135  138  138   Potassium 3.5 - 5.1 mmol/L 3.6  3.7  3.5   Chloride 98 - 111 mmol/L 104  108  109   CO2 22 - 32 mmol/L 22  23  21    Calcium 8.9 - 10.3 mg/dL 8.4  8.6  8.4     Micro BC- 08/14/22 Enterococcis UC - enterococcus 08/15/22 BC-NG   Impression/recommendation  Severe Low back pain- has previous L5-s1 fusion  discitis. Epidural abscess or osteo cannot be ruled out as MRI cannot be done due to pacemaker. CT scan did not show any infection Tagged WBC scan did not show any focus in the lumbar spine HE will get PET scan as OP ESR > 100 With underlying maliganncy it could be high- also high in vertebral infection  Enterococcus bacteremia-  TEE neg for endocarditis UC positive for enterococcus Repeat blood culture neg He is on ampicillin  day 17  Will need  total of 6 weeks or more  10/05/22 On day 17 of ampicillin. Will change to Po amoxicillin 1 gram TID until 10/05/22 Will follow him as OP- appt given for 10/05/22  AAA- s/p endovascular stent- worsening aneurysm with endo leak 2. S/p intervention  wth coiling of lumbar arteries   Small left PE   SA nodal disease- s/p pacemaker- lead in the left ventricle after going thru the interatrial septal opening TEE neg for endocarditis   Recent fall and fracture rt femur, s/p bipolar arthroplasty of the rt hip   CKD   Anemia   Multiple malignancies- SCC rt LL lung s/p lobectomy SCC tongue s/p radiation  Ca prostate s/p prostatectomy   CAD s/p CABG      Mild cognitive impairment- on aricept   Discussed the management with patient and wife

## 2022-08-31 NOTE — Progress Notes (Signed)
Progress Note   Patient: Rick Mcbride. OHY:[id number] DOB: 1941/06/22 DOA: 08/14/2022     17 DOS: the patient was seen and examined on 08/31/2022   Brief hospital course: Rick T Marquize Seib. is a 81 y.o. 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 Patient had surgery and finished SNF rehab. Currently he is doing PT/OT at home), chronic lower back pain (L5-S1 fusion), who presents with lower back pain and shortness breath. Pt had negative PE by V/Q scan which is ordered by his PCP day PTA, which was done for persistent SOB postoperatively. 10/07: WBC 3.8, negative COVID PCR, lactic acid 0.9, troponin level 31, 30, slightly worsening renal function with creatinine 2.69, BUN 31, GFR 23 (recent baseline creatinine 2.39 06/15/2022), temperature normal, blood pressure 172/84, 129/64, RR 83, heart rate 83, RR 17, oxygen saturation 98% on room air.  Chest x-ray showed possible mild patchy infiltration in left lower lobe.  CT head negative.  CT scan showed enlargement of AAA from previous 6.9 to 7.6 cm now.  Patient is admitted to telemetry bed as inpatient. Dr. Lorenso Courier of [facility] was consulted re: AAA, needs CTA, nephrology consulted and recommended IV fluids prior to CTA of Abd/Pelvis. Cardiology also consulted for surgical optimization/risk stratification - medically optimized at this time, troponin mild elevation likely d/t CKD4. BCx (+) E Faecalis, ID recommended r/o endocarditis/pacer infection w/ TEE and vascular surgery to assess re: graft involvement, changed Abx to ampicillin and repeat Bcx, also recommend MRI L-spine or CT  w/ contrast, pt has pacemaker   10/08: Pt and wife agreeable to TEE and MRI, though MRI would have to be at [facility]. Per vascular: "Noted bacteremia- however, patient with chronic progressive lower back pain; expanding aortic sac may be contributory to pain- not definitive. Therefore, preferable to intervene sooner than later." Per ID, would consider in order of importance to 1) treat bacteremia, 2) address aorto-bifem leak, 3) TEE, 4) MRI/CT L-spine. IF we decide to do CT, this can be done in-house, vs RI would require transfer. Will plan to proceed w/ IV abx, do vascular procedure tomorrow as scheduled, and see how he progresses from there.  10/09: Vascular procedure today to fix graft.  Radiology reached out this morning, concern for small PE noted on CTA abdomen/pelvis.  Holding off on full CTA chest at this time due to concern with contrast/renal function but will plan for heparin. 10/10: Renal function okay. CT w/ contrast lumbar spine - no osteomyelitis noted. CTA chest no PE but Significant mucus within RIGHT mainstem bronchus extending into bronchus intermedius and RIGHT lower lobe. Continue IV fluids and recheck BMP in AM. TEE planned for 10/12.  10/11-17: TEE done on 10/17 was negative for any endocarditis. Pulmonology was also consulted for concern of mucous plugging, has been resolved.6 renal function seems stable. Repeat blood cultures from 08/15/2022 remains negative, Unable to rule out epidural abscess or as still as MRI cannot be done due to pacemaker and wife refused to go to [facility] for MRI.  Will continue ampicillin for 6 weeks. Neurosurgery is recommending conservative management with brace.  Avoid NSAID due to CKD. PT/OT are recommending SNF.  10/18: Infectious disease ordered WBC scan to rule out epidural abscess or discitis.  Continued to have lower back pain.  10/19: Continue to have lower back pain.  Scheduled for WBC scan today.  Pending SNF placement-no bed offer  yet.  10/20: WBC scan was negative for any concern of discitis or osteomyelitis.  Still no bed offer. Infectious disease to determine the duration of antibiotics now. Patient was complaining of some worsening hoarseness of his voice for the past couple of days, no pain.  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  Advised to follow-up with ENT if he continues to have hoarseness.  Recently had his follow-up.  PET scan is due in December.  10/21: Remained hemodynamically stable.  ID recommendations remained for 6 weeks of ampicillin, until 09/27/2022.  We will continue IV while in the hospital and convert to p.o. amoxicillin 1 g Q8 hourly on discharge.  10/22: Patient with little worsening of the cough and multiple bowel movements with bowel regimen.  Added Tessalon and made MiraLAX as as needed instead of twice daily.  10/23: Medically stable.  No new concern.  Still awaiting disposition  10/24: [facility] pending [facility] authorization.  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 S/p repair on 08/16/2022 by vascular surgery. Patient had AAA repair done in 2018 by Dr. Lucky Cowboy. -Continue to monitor  Bacteremia due to Enterococcus Patient was found to have Enterococcus faecalis bacteremia, most likely secondary to UTI. TTE and TEE was negative for endocarditis.  Unable to rule out epidural abscess or discitis as unable to  obtain MRI due to pacemaker.  Wife does not want him to go to [facility]. Repeat blood cultures done on 08/15/2022 remain negative. Infectious disease on board.  WBC scan was negative for discitis or osteomyelitis. -Continue with ampicillin, current plan is to continue for 6 weeks, patient will be getting IV ampicillin while in the hospital and p.o. 1 g every 8 hourly on discharge.  AAA (abdominal aortic aneurysm) without rupture (HCC)-resolved as of 08/25/2022 S/p of AAA repair 2018 by Dr. Lucky Cowboy. Now the size has enlarged from 6.9 to 7.6 cm -consulted Dr. Lorenso Courier of [facility]  Low back pain Lumbar OA, DDD Multiple lobar spine hardware This seems to be a chronic issue, has worsened recently. 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 There is osteopenia degenerative change of the lumbar spine with discogenic mild grade 1 retrolisthesis unchanged at L1-2 and L2-3, slight dextroscoliosis. Unable to obtain MRI due to pacemaker. Neurosurgery is recommending conservative management with brace and outpatient follow-up. -pain control: As needed Percocet, Tylenol -As needed Robaxin -lidoderm  COPD exacerbation ([place]) Concern of some COPD exacerbation on admission with some wheezing which has been resolved.  No infiltrate on imaging but there was a concern of mucous plugging. Procalcitonin was elevated at 0.39. Patient received a course of steroids and antibiotics. Currently stable on room air. -Continue to  monitor   Aneurysm of right common iliac artery (HCC)-resolved as of 08/25/2022 -f/u with [facility]  Mucus plugging of bronchi SOB w/ mucus plugging of bronchi noted on CT PE resolved on CTA (recently diagnosed) Pulmonary was consulted and patient received MetaNeb with albuterol.  They also recommend incentive spirometry and bronchopulmonary hygiene.  Respiratory status improved. -Continue to monitor   Chronic kidney  disease, stage IV (severe) (HCC) AKI improved.  Creatinine currently at baseline. -Continue to monitor -Avoid nephrotoxins  Myocardial injury-resolved as of 08/25/2022 -see above  Essential hypertension Blood pressure mildly elevated. Home Cozaar was switched with amlodipine. -Continue with amlodipine and metoprolol -Continue with as needed hydralazine  Chronic diastolic CHF (congestive heart failure) (HCC) 2D echo on 06/14/2022 showed EF of 60 to 65% with grade 1 diastolic dysfunction.  Patient does not have leg edema.  No pulm edema chest x-ray.  CHF seem to be compensated. -Continue to monitor  CAD S/P CABG x 3 Hx of CAD s/p of CABG and myocardial injury. Patient had mildly positive troponin with a flat curve most likely secondary to demand ischemia.  No chest pain. -Continue home Lipitor and Zetia.  HLD (hyperlipidemia) -Continue to Lipitor and zetia  Iron deficiency anemia Hemoglobin slowly trending down.  No active bleeding. 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 -s/p of RRL lobectomy and radiation therapy.  No acute issues.  Anxiety - Xanax 0.25 mg bid PRN  Weakness PT is recommending SNF. -TOC consult for placement  Obesity (BMI 30-39.9)-resolved as of 08/25/2022  BMI= 30.68  and BW= 99.8 -Diet and exercise.   -Encourage to lose weight.  Subjective: Patient was sitting comfortably in chair when seen today.  Wife at bedside.  Patient had little rough night due to lower back pain.  Physical Exam: Vitals:   08/31/22 0342 08/31/22 0526 08/31/22 0746 08/31/22 1139  BP: 125/75  118/74 111/67  Pulse: 93  92 86  Resp: 16  14 18   Temp: 97.7 F (36.5 C)  99.1 F (37.3 C) 98 F (36.7 C)  TempSrc: Oral  Oral Oral  SpO2: 100%  99%  100%  Weight:  83.9 kg    Height:       General.  Frail elderly man, in no acute distress. Pulmonary.  Lungs clear bilaterally, normal respiratory effort. CV.  Regular rate and rhythm, no JVD, rub or murmur. Abdomen.  Soft, nontender, nondistended, BS positive. CNS.  Alert and oriented .  No focal neurologic deficit. Extremities.  No edema, no cyanosis, pulses intact and symmetrical. Psychiatry.  Judgment and insight appears normal.    Data Reviewed: Prior data reviewed  Family Communication: Discussed with wife at bedside  Disposition: Status is: Inpatient Remains inpatient appropriate because: Severity of illness   Planned Discharge Destination: Skilled nursing facility  DVT prophylaxis.  Subcu heparin Time spent: 42 minutes  This record has been created using Systems analyst. Errors have been sought and corrected,but may not always be located. Such creation errors do not reflect on the standard of care.  Author: Lorella Nimrod, MD 08/31/2022 2:52 PM  For on call review [email or web address].

## 2022-08-31 NOTE — Progress Notes (Signed)
Physical Therapy Treatment Patient Details Name: Rick Mcbride. MRN: [id number] DOB: 10-07-1941 Today's Date: 08/31/2022   History of Present Illness Rick Mcbride. is a 81 y.o. 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     Patient was recently hospitalized from 8/2 - 8/8 due to right femoral neck fracture.  Patient had surgery and finished the rehab.  Currently he is doing PT/OT at home.  He states that in the past several days, his lower back pain has worsened, difficult walking and doing physical therapy.  The lower back pain is constant, 7 out of 10 in severity, aching, nonradiating.  No loss control of bladder or bowel movement.  Denies new injury.  Patient also reports shortness of breath and dry cough.  No chest pain, fever or chills.  Denies nausea, vomiting, diarrhea or abdominal pain. Pt still has some mild right hip pain.  No symptoms of UTI. Pt had negative PE by V/Q scan which is ordered by his PCP yesterday.    PT Comments    Pt seen this pm to assist with chair to bed transfer. Pt unable to complete with nursing, hoyer lift not an option at time of need. Pt required MaxA x 2 to complete a lateral scoot from drop arm recliner to edge of bed. Minimal assistance provided from pt due to significant lower back pain and overall weakness. Recommend utilizing hoyer lift for back to bed transfers in future.   Recommendations for follow up therapy are one component of a multi-disciplinary discharge planning process, led by the attending  physician.  Recommendations may be updated based on patient status, additional functional criteria and insurance authorization.  Follow Up Recommendations  Skilled nursing-short term rehab (<3 hours/day) Can patient physically be transported by private vehicle: No   Assistance Recommended at Discharge Frequent or constant Supervision/Assistance  Patient can return home with the following Two people to help with walking and/or transfers;Two people to help with bathing/dressing/bathroom;Help with stairs or ramp for entrance;Assist for transportation;Assistance with cooking/housework   Equipment Recommendations  None recommended by PT    Recommendations for Other Services       Precautions / Restrictions Precautions Precautions: Fall;Back Precaution Booklet Issued: No Required Braces or Orthoses: Spinal Brace (for comfort) Spinal Brace: Lumbar corset Restrictions Weight Bearing Restrictions: No     Mobility  Bed Mobility Overal bed mobility: Needs Assistance Bed Mobility: Sit to Supine Rolling: Mod assist Sidelying to sit: Mod assist Supine to sit: HOB elevated, Mod assist Sit to supine: Max assist, +2 for physical assistance Sit to sidelying: Max assist, +2 for physical assistance General bed mobility comments: Pt unable to assist with lying down due to pain and fatigue    Transfers Overall transfer level: Needs assistance Equipment used: Rolling walker (2 wheels) Transfers: Bed to chair/wheelchair/BSC Sit to Stand: +2 physical assistance, From elevated surface Stand pivot transfers: +2 physical assistance, From elevated surface        Lateral/Scoot Transfers: Max assist, +2 physical assistance General transfer comment: Incereased assist needed after sitting up in chair for 2.5 hours  Ambulation/Gait               General Gait Details:  (Unable to maintain standing posture long enough to attempt)   Stairs             Wheelchair Mobility     Modified Rankin (Stroke Patients Only)       Balance Overall balance assessment: Needs assistance Sitting-balance support: Bilateral upper extremity supported, Feet supported Sitting balance-Leahy Scale: Fair Sitting balance - Comments: min assist static sitting Postural control: Posterior lean Standing balance support: Bilateral upper extremity supported, Reliant on assistive device for balance Standing balance-Leahy Scale: Zero Standing balance comment: Pt stood for 5 trials from EOB, using sink countertop for support.                            Cognition Arousal/Alertness: Awake/alert Behavior During Therapy: WFL for tasks assessed/performed Overall Cognitive Status: Within Functional Limits for tasks assessed                                 General Comments: Follow simple commands during session, willing to participate.        Exercises      General Comments        Pertinent Vitals/Pain Pain Assessment Pain Assessment: 0-10 Pain Score: 8  Pain Location: back during mobility and sitting up Pain Descriptors / Indicators: Discomfort, Aching, Grimacing Pain Intervention(s): Monitored during session, Repositioned    Home Living                          Prior Function            PT Goals (current goals can now be found in the care plan section) Acute Rehab PT Goals Patient Stated Goal: Feel better    Frequency    Min 2X/week      PT Plan Current plan remains appropriate    Co-evaluation PT/OT/SLP Co-Evaluation/Treatment: Yes Reason for Co-Treatment: Complexity of the patient's impairments (multi-system involvement);For patient/therapist safety PT goals addressed during session: Mobility/safety with mobility;Balance OT goals addressed during session: Strengthening/ROM;Other (comment) (Functional transfers)      AM-PAC PT "6 Clicks" Mobility   Outcome Measure  Help needed turning from your back to your side  while in a flat bed without using bedrails?: A Lot Help needed moving from lying on your back to sitting on the side of a flat bed without using bedrails?: A Lot Help needed moving to and from a bed to a chair (including a wheelchair)?: A Lot Help needed standing up from a chair using your arms (e.g., wheelchair or bedside chair)?: A Lot Help needed to walk in hospital room?: Total Help needed climbing 3-5 steps with a railing? : Total 6 Click Score: 10    End of Session Equipment Utilized During Treatment: Gait belt Activity Tolerance: Patient limited by pain;Patient limited by fatigue Patient left: in chair;with call bell/phone within reach;with family/visitor present;with bed alarm set Nurse Communication: Mobility status;Need for lift equipment (? hoyer for back to bed) PT Visit Diagnosis: Unsteadiness on feet (R26.81);Other abnormalities of gait and mobility (R26.89);Muscle weakness (generalized) (M62.81);Difficulty in walking, not elsewhere classified (R26.2);Pain Pain - part of body:  (lower back)     Time: 1275-1700 PT Time Calculation (min) (ACUTE ONLY): 8 min  Charges:  $Therapeutic Activity: 8-22 mins  Mikel Cella, PTA    Josie Dixon 08/31/2022, 2:51 PM

## 2022-09-01 DIAGNOSIS — R400 Somnolence: Secondary | ICD-10-CM | POA: Diagnosis not present

## 2022-09-01 DIAGNOSIS — E559 Vitamin D deficiency, unspecified: Secondary | ICD-10-CM | POA: Diagnosis not present

## 2022-09-01 DIAGNOSIS — Z7401 Bed confinement status: Secondary | ICD-10-CM | POA: Diagnosis not present

## 2022-09-01 DIAGNOSIS — Z8679 Personal history of other diseases of the circulatory system: Secondary | ICD-10-CM | POA: Diagnosis not present

## 2022-09-01 DIAGNOSIS — C3490 Malignant neoplasm of unspecified part of unspecified bronchus or lung: Secondary | ICD-10-CM | POA: Diagnosis not present

## 2022-09-01 DIAGNOSIS — E440 Moderate protein-calorie malnutrition: Secondary | ICD-10-CM | POA: Diagnosis not present

## 2022-09-01 DIAGNOSIS — Z950 Presence of cardiac pacemaker: Secondary | ICD-10-CM | POA: Diagnosis not present

## 2022-09-01 DIAGNOSIS — Z741 Need for assistance with personal care: Secondary | ICD-10-CM | POA: Diagnosis not present

## 2022-09-01 DIAGNOSIS — I10 Essential (primary) hypertension: Secondary | ICD-10-CM | POA: Diagnosis not present

## 2022-09-01 DIAGNOSIS — N390 Urinary tract infection, site not specified: Secondary | ICD-10-CM | POA: Diagnosis not present

## 2022-09-01 DIAGNOSIS — R2681 Unsteadiness on feet: Secondary | ICD-10-CM | POA: Diagnosis not present

## 2022-09-01 DIAGNOSIS — R059 Cough, unspecified: Secondary | ICD-10-CM | POA: Diagnosis not present

## 2022-09-01 DIAGNOSIS — M6259 Muscle wasting and atrophy, not elsewhere classified, multiple sites: Secondary | ICD-10-CM | POA: Diagnosis not present

## 2022-09-01 DIAGNOSIS — J159 Unspecified bacterial pneumonia: Secondary | ICD-10-CM | POA: Diagnosis not present

## 2022-09-01 DIAGNOSIS — M545 Low back pain, unspecified: Secondary | ICD-10-CM | POA: Diagnosis not present

## 2022-09-01 DIAGNOSIS — N179 Acute kidney failure, unspecified: Secondary | ICD-10-CM | POA: Diagnosis not present

## 2022-09-01 DIAGNOSIS — R634 Abnormal weight loss: Secondary | ICD-10-CM | POA: Diagnosis not present

## 2022-09-01 DIAGNOSIS — G3184 Mild cognitive impairment, so stated: Secondary | ICD-10-CM | POA: Diagnosis not present

## 2022-09-01 DIAGNOSIS — I2510 Atherosclerotic heart disease of native coronary artery without angina pectoris: Secondary | ICD-10-CM | POA: Diagnosis not present

## 2022-09-01 DIAGNOSIS — C029 Malignant neoplasm of tongue, unspecified: Secondary | ICD-10-CM | POA: Diagnosis not present

## 2022-09-01 DIAGNOSIS — R52 Pain, unspecified: Secondary | ICD-10-CM | POA: Diagnosis not present

## 2022-09-01 DIAGNOSIS — D509 Iron deficiency anemia, unspecified: Secondary | ICD-10-CM | POA: Diagnosis not present

## 2022-09-01 DIAGNOSIS — Z923 Personal history of irradiation: Secondary | ICD-10-CM | POA: Diagnosis not present

## 2022-09-01 DIAGNOSIS — M6281 Muscle weakness (generalized): Secondary | ICD-10-CM | POA: Diagnosis not present

## 2022-09-01 DIAGNOSIS — R1110 Vomiting, unspecified: Secondary | ICD-10-CM | POA: Diagnosis not present

## 2022-09-01 DIAGNOSIS — G894 Chronic pain syndrome: Secondary | ICD-10-CM | POA: Diagnosis not present

## 2022-09-01 DIAGNOSIS — R531 Weakness: Secondary | ICD-10-CM | POA: Diagnosis not present

## 2022-09-01 DIAGNOSIS — R058 Other specified cough: Secondary | ICD-10-CM | POA: Diagnosis not present

## 2022-09-01 DIAGNOSIS — S72001D Fracture of unspecified part of neck of right femur, subsequent encounter for closed fracture with routine healing: Secondary | ICD-10-CM | POA: Diagnosis not present

## 2022-09-01 DIAGNOSIS — Z951 Presence of aortocoronary bypass graft: Secondary | ICD-10-CM | POA: Diagnosis not present

## 2022-09-01 DIAGNOSIS — I130 Hypertensive heart and chronic kidney disease with heart failure and stage 1 through stage 4 chronic kidney disease, or unspecified chronic kidney disease: Secondary | ICD-10-CM | POA: Diagnosis not present

## 2022-09-01 DIAGNOSIS — N184 Chronic kidney disease, stage 4 (severe): Secondary | ICD-10-CM | POA: Diagnosis not present

## 2022-09-01 DIAGNOSIS — M549 Dorsalgia, unspecified: Secondary | ICD-10-CM | POA: Diagnosis not present

## 2022-09-01 DIAGNOSIS — I129 Hypertensive chronic kidney disease with stage 1 through stage 4 chronic kidney disease, or unspecified chronic kidney disease: Secondary | ICD-10-CM | POA: Diagnosis not present

## 2022-09-01 DIAGNOSIS — C61 Malignant neoplasm of prostate: Secondary | ICD-10-CM | POA: Diagnosis not present

## 2022-09-01 DIAGNOSIS — E785 Hyperlipidemia, unspecified: Secondary | ICD-10-CM | POA: Diagnosis not present

## 2022-09-01 DIAGNOSIS — I700 Atherosclerosis of aorta: Secondary | ICD-10-CM | POA: Diagnosis not present

## 2022-09-01 DIAGNOSIS — R7989 Other specified abnormal findings of blood chemistry: Secondary | ICD-10-CM | POA: Diagnosis not present

## 2022-09-01 DIAGNOSIS — E611 Iron deficiency: Secondary | ICD-10-CM | POA: Diagnosis not present

## 2022-09-01 DIAGNOSIS — U071 COVID-19: Secondary | ICD-10-CM | POA: Diagnosis not present

## 2022-09-01 DIAGNOSIS — M4646 Discitis, unspecified, lumbar region: Secondary | ICD-10-CM | POA: Diagnosis not present

## 2022-09-01 DIAGNOSIS — M47816 Spondylosis without myelopathy or radiculopathy, lumbar region: Secondary | ICD-10-CM | POA: Diagnosis not present

## 2022-09-01 DIAGNOSIS — J449 Chronic obstructive pulmonary disease, unspecified: Secondary | ICD-10-CM | POA: Diagnosis not present

## 2022-09-01 DIAGNOSIS — Z79899 Other long term (current) drug therapy: Secondary | ICD-10-CM | POA: Diagnosis not present

## 2022-09-01 DIAGNOSIS — D72829 Elevated white blood cell count, unspecified: Secondary | ICD-10-CM | POA: Diagnosis not present

## 2022-09-01 DIAGNOSIS — R4789 Other speech disturbances: Secondary | ICD-10-CM | POA: Diagnosis not present

## 2022-09-01 DIAGNOSIS — E782 Mixed hyperlipidemia: Secondary | ICD-10-CM | POA: Diagnosis not present

## 2022-09-01 DIAGNOSIS — R41841 Cognitive communication deficit: Secondary | ICD-10-CM | POA: Diagnosis not present

## 2022-09-01 DIAGNOSIS — Z5189 Encounter for other specified aftercare: Secondary | ICD-10-CM | POA: Diagnosis not present

## 2022-09-01 DIAGNOSIS — R918 Other nonspecific abnormal finding of lung field: Secondary | ICD-10-CM | POA: Diagnosis not present

## 2022-09-01 DIAGNOSIS — R0602 Shortness of breath: Secondary | ICD-10-CM

## 2022-09-01 DIAGNOSIS — R7881 Bacteremia: Secondary | ICD-10-CM | POA: Diagnosis not present

## 2022-09-01 DIAGNOSIS — I714 Abdominal aortic aneurysm, without rupture, unspecified: Secondary | ICD-10-CM | POA: Diagnosis not present

## 2022-09-01 DIAGNOSIS — R402411 Glasgow coma scale score 13-15, in the field [EMT or ambulance]: Secondary | ICD-10-CM | POA: Diagnosis not present

## 2022-09-01 DIAGNOSIS — D62 Acute posthemorrhagic anemia: Secondary | ICD-10-CM | POA: Diagnosis not present

## 2022-09-01 DIAGNOSIS — E119 Type 2 diabetes mellitus without complications: Secondary | ICD-10-CM | POA: Diagnosis not present

## 2022-09-01 DIAGNOSIS — I5A Non-ischemic myocardial injury (non-traumatic): Secondary | ICD-10-CM | POA: Diagnosis not present

## 2022-09-01 DIAGNOSIS — Z96641 Presence of right artificial hip joint: Secondary | ICD-10-CM | POA: Diagnosis not present

## 2022-09-01 DIAGNOSIS — K219 Gastro-esophageal reflux disease without esophagitis: Secondary | ICD-10-CM | POA: Diagnosis not present

## 2022-09-01 DIAGNOSIS — B952 Enterococcus as the cause of diseases classified elsewhere: Secondary | ICD-10-CM | POA: Diagnosis not present

## 2022-09-01 DIAGNOSIS — D649 Anemia, unspecified: Secondary | ICD-10-CM | POA: Diagnosis not present

## 2022-09-01 DIAGNOSIS — R278 Other lack of coordination: Secondary | ICD-10-CM | POA: Diagnosis not present

## 2022-09-01 DIAGNOSIS — I712 Thoracic aortic aneurysm, without rupture, unspecified: Secondary | ICD-10-CM | POA: Diagnosis not present

## 2022-09-01 DIAGNOSIS — I5032 Chronic diastolic (congestive) heart failure: Secondary | ICD-10-CM | POA: Diagnosis not present

## 2022-09-01 MED ORDER — BENZONATATE 200 MG PO CAPS: 200.0000 mg | ORAL_CAPSULE | Freq: Two times a day (BID) | ORAL | 0 refills | Status: DC | PRN

## 2022-09-01 MED ORDER — CHLORPROMAZINE HCL 25 MG PO TABS: 25.0000 mg | ORAL_TABLET | Freq: Three times a day (TID) | ORAL | Status: DC | PRN

## 2022-09-01 MED ORDER — LIDOCAINE 5 % EX PTCH: 1.0000 | MEDICATED_PATCH | CUTANEOUS | 0 refills | Status: DC

## 2022-09-01 MED ORDER — POLYETHYLENE GLYCOL 3350 17 G PO PACK: 17.0000 g | PACK | Freq: Every day | ORAL | 0 refills | Status: DC | PRN

## 2022-09-01 MED ORDER — TRAMADOL HCL 50 MG PO TABS: 50.0000 mg | ORAL_TABLET | Freq: Four times a day (QID) | ORAL | 0 refills | Status: DC | PRN

## 2022-09-01 MED ORDER — AMLODIPINE BESYLATE 10 MG PO TABS: 10.0000 mg | ORAL_TABLET | Freq: Every day | ORAL | Status: DC

## 2022-09-01 MED ORDER — AMOXICILLIN 500 MG PO CAPS: 1000.0000 mg | ORAL_CAPSULE | Freq: Three times a day (TID) | ORAL | 0 refills | Status: AC

## 2022-09-01 MED ORDER — OXYCODONE-ACETAMINOPHEN 5-325 MG PO TABS: 2.0000 | ORAL_TABLET | Freq: Four times a day (QID) | ORAL | 0 refills | Status: DC

## 2022-09-01 NOTE — Discharge Summary (Signed)
Physician Discharge Summary   Patient: Rick Mcbride. MRN: [id number] DOB: 1941/03/05  Admit date:     08/14/2022  Discharge date: 09/01/22  Discharge Physician: Lorella Nimrod   PCP: Idelle Crouch, MD   Recommendations at discharge:  Please obtain CBC and BMP in 1 week Follow-up with primary care provider Follow-up with vascular surgery Follow-up with infectious disease  Discharge Diagnoses: Principal Problem:   Thoracic aortic aneurysm without rupture [facility]) Active Problems:   Bacteremia due to Enterococcus   Low back pain   COPD exacerbation (HCC)   Mucus plugging of bronchi   Chronic kidney disease, stage IV (severe) (HCC)   Essential hypertension   Chronic diastolic CHF (congestive heart failure) (HCC)   CAD S/P CABG x 3   HLD (hyperlipidemia)   Iron deficiency anemia   TIA (transient ischemic attack)   Mild cognitive impairment   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 is a 81 y.o. 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 Patient had surgery and finished SNF rehab. Currently he is doing PT/OT at home), chronic lower back pain (L5-S1  fusion), who presents with lower back pain and shortness breath. Pt had negative PE by V/Q scan which is ordered by his PCP day PTA, which was done for persistent SOB postoperatively. 10/07: WBC 3.8, negative COVID PCR, lactic acid 0.9, troponin level 31, 30, slightly worsening renal function with creatinine 2.69, BUN 31, GFR 23 (recent baseline creatinine 2.39 06/15/2022), temperature normal, blood pressure 172/84, 129/64, RR 83, heart rate 83, RR 17, oxygen saturation 98% on room air.  Chest x-ray showed possible mild patchy infiltration in left lower lobe.  CT head negative.  CT scan showed enlargement of AAA from previous 6.9 to 7.6 cm now.  Patient is admitted to telemetry bed as inpatient. Dr. Lorenso Courier of [facility] was consulted re: AAA, needs CTA, nephrology consulted and recommended IV fluids prior to CTA of Abd/Pelvis. Cardiology also consulted for surgical optimization/risk stratification - medically optimized at this time, troponin mild elevation likely d/t CKD4. BCx (+) E Faecalis, ID recommended r/o endocarditis/pacer infection w/ TEE and vascular surgery to assess re: graft involvement, changed Abx to ampicillin and repeat Bcx, also recommend MRI L-spine or CT w/ contrast, pt has pacemaker   10/08: Pt and wife agreeable to TEE and MRI, though MRI would have to be at [facility]. Per vascular: "Noted bacteremia- however, patient with chronic progressive lower back pain; expanding aortic sac may be contributory to pain- not definitive. Therefore, preferable to intervene sooner than later." Per ID, would consider in order of importance to 1) treat bacteremia, 2) address aorto-bifem leak, 3) TEE, 4) MRI/CT  L-spine. IF we decide to do CT, this can be done in-house, vs RI would require transfer. Will plan to proceed w/ IV abx, do vascular procedure tomorrow as scheduled, and see how he progresses from there.  10/09: Vascular procedure today to fix graft.  Radiology reached out this morning, concern for small PE noted on CTA  abdomen/pelvis.  Holding off on full CTA chest at this time due to concern with contrast/renal function but will plan for heparin. 10/10: Renal function okay. CT w/ contrast lumbar spine - no osteomyelitis noted. CTA chest no PE but Significant mucus within RIGHT mainstem bronchus extending into bronchus intermedius and RIGHT lower lobe. Continue IV fluids and recheck BMP in AM. TEE planned for 10/12.  10/11-17: TEE done on 10/17 was negative for any endocarditis. Pulmonology was also consulted for concern of mucous plugging, has been resolved.6 renal function seems stable. Repeat blood cultures from 08/15/2022 remains negative, Unable to rule out epidural abscess or as still as MRI cannot be done due to pacemaker and wife refused to go to [facility] for MRI.  Will continue ampicillin for 6 weeks. Neurosurgery is recommending conservative management with brace.  Avoid NSAID due to CKD. PT/OT are recommending SNF.  10/18: Infectious disease ordered WBC scan to rule out epidural abscess or discitis.  Continued to have lower back pain.  10/19: Continue to have lower back pain.  Scheduled for WBC scan today.  Pending SNF placement-no bed offer yet.  10/20: WBC scan was negative for any concern of discitis or osteomyelitis.  Still no bed offer. Infectious disease to determine the duration of antibiotics now. Patient was complaining of some worsening hoarseness of his voice for the past couple of days, no pain.  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  Advised to follow-up with ENT if he continues to have hoarseness.  Recently had his follow-up.  PET scan is due in December.  10/21: Remained hemodynamically stable.  ID recommendations remained for 6 weeks of ampicillin, until 09/27/2022.  We will continue IV while in the hospital and convert to p.o. amoxicillin 1 g Q8 hourly on discharge.  10/22: Patient with little  worsening of the cough and multiple bowel movements with bowel regimen.  Added Tessalon and made MiraLAX as as needed instead of twice daily.  10/23: Medically stable.  No new concern.  Still awaiting disposition  10/24: [facility] authorization.  10/25: Remained hemodynamically stable.  Is being discharged to SNF for rehab and pain control. Patient will be taking amoxicillin 1 g every 8 hourly until 10/05/2022 and will follow-up with infectious disease according to his appointment. Patient is being discharged on tramadol and Percocet to be used as needed.  Patient will continue on current medications and need to have a close follow-up with his providers.  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 S/p repair on 08/16/2022 by vascular surgery. Patient had AAA repair done in 2018 by Dr. Lucky Cowboy. -  Continue to monitor  Bacteremia due to Enterococcus Patient was found to have Enterococcus faecalis bacteremia, most likely secondary to UTI. TTE and TEE was negative for endocarditis.  Unable to rule out epidural abscess or discitis as unable to obtain MRI due to pacemaker.  Wife does not want him to go to [facility]. Repeat blood cultures done on 08/15/2022 remain negative. Infectious disease on board.  WBC scan was negative for discitis or osteomyelitis. -Continue with ampicillin, current plan is to continue for 6 weeks, patient will be getting IV ampicillin while in the hospital and  p.o. 1 g every 8 hourly on discharge.  AAA (abdominal aortic aneurysm) without rupture (HCC)-resolved as of 08/25/2022 S/p of AAA repair 2018 by Dr. Lucky Cowboy. Now the size has enlarged from 6.9 to 7.6 cm -consulted Dr. Lorenso Courier of [facility]  Low back pain Lumbar OA, DDD Multiple lobar spine hardware This seems to be a chronic issue, has worsened recently. 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 There is osteopenia degenerative change of the lumbar spine with discogenic mild grade 1 retrolisthesis unchanged at L1-2 and L2-3, slight dextroscoliosis. Unable to obtain MRI due to pacemaker. Neurosurgery is recommending conservative management with brace and outpatient follow-up. -pain control: As needed Percocet, Tylenol -As needed Robaxin -lidoderm  COPD exacerbation ([place]) Concern of some COPD exacerbation on admission with some wheezing which has been resolved.  No infiltrate on imaging but there was a concern of mucous plugging. Procalcitonin was elevated at 0.39. Patient received a course of steroids and antibiotics. Currently stable on room air. -Continue to monitor   Aneurysm of right common iliac artery (HCC)-resolved as of 08/25/2022 -f/u with [facility]  Mucus plugging of bronchi SOB w/ mucus plugging of bronchi noted on CT PE resolved on CTA (recently diagnosed) Pulmonary was consulted and patient received MetaNeb with albuterol.  They also recommend incentive spirometry and bronchopulmonary hygiene.  Respiratory status improved. -Continue to monitor   Chronic kidney disease, stage IV (severe) (HCC) AKI improved.  Creatinine currently at baseline. -Continue to monitor -Avoid nephrotoxins  Myocardial injury-resolved as of 08/25/2022 -see above  Essential hypertension Blood pressure mildly elevated. Home Cozaar was switched with amlodipine. -Continue with amlodipine and metoprolol -Continue with as needed  hydralazine  Chronic diastolic CHF (congestive heart failure) (HCC) 2D echo on 06/14/2022 showed EF of 60 to 65% with grade 1 diastolic dysfunction.  Patient does not have leg edema.  No pulm edema chest x-ray.  CHF seem to be compensated. -Continue to monitor  CAD S/P CABG x 3 Hx of CAD s/p of CABG and myocardial injury. Patient had mildly positive troponin with a flat curve most likely secondary to demand ischemia.  No chest pain. -Continue home Lipitor and Zetia.  HLD (hyperlipidemia) -Continue to Lipitor and zetia  Iron deficiency anemia Hemoglobin slowly trending down.  No active bleeding. 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 -s/p of RRL lobectomy and radiation therapy.  No acute issues.  Anxiety - Xanax 0.25 mg bid PRN  Weakness PT is recommending SNF. -TOC consult for placement  Obesity (BMI 30-39.9)-resolved as of 08/25/2022  BMI= 30.68  and BW= 99.8 -Diet and exercise.   -Encourage to lose weight.  Pain control - [employer] Controlled Substance Reporting System database was reviewed. and patient was instructed, not to drive, operate heavy machinery, perform activities at heights, swimming or participation in water activities or provide baby-sitting services while on Pain, Sleep and Anxiety Medications; until their outpatient Physician has advised to do so again. Also recommended to not to take more than prescribed Pain, Sleep and Anxiety Medications.  Disposition: Skilled nursing facility Diet recommendation:  Discharge Diet Orders (From admission, onward)     Start     Ordered   09/01/22 0000  Diet - low sodium heart healthy        09/01/22 1516           Cardiac diet DISCHARGE MEDICATION: Allergies as of  09/01/2022   No Known Allergies      Medication List     STOP taking these medications    apixaban 2.5 MG Tabs tablet Commonly known as: ELIQUIS   loratadine 10 MG tablet Commonly known as: CLARITIN   losartan 100 MG tablet Commonly known as: COZAAR       TAKE these medications    acetaminophen 500 MG tablet Commonly known as: TYLENOL Take 500 mg by mouth every 4 (four) hours as needed for moderate pain.   amLODipine 10 MG tablet Commonly known as: NORVASC Take 1 tablet (10 mg total) by mouth daily. Start taking on: September 02, 2022   amoxicillin 500 MG capsule Commonly known as: AMOXIL Take 2 capsules (1,000 mg total) by mouth every 8 (eight) hours for 106 doses.   aspirin EC 81 MG tablet Take 81 mg by mouth daily.   atorvastatin 20 MG tablet Commonly known as: LIPITOR Take 40 mg by mouth at bedtime.   benzonatate 200 MG capsule Commonly known as: TESSALON Take 1 capsule (200 mg total) by mouth 2 (two) times daily as needed for cough.   calcitRIOL 0.25 MCG capsule Commonly known as: ROCALTROL Take 0.25 mcg by mouth in the morning.   CALCIUM 600 + D PO Take 1 tablet by mouth daily after lunch.   cetirizine 10 MG tablet Commonly known as: ZYRTEC Take 10 mg by mouth at bedtime.   chlorproMAZINE 25 MG tablet Commonly known as: THORAZINE Take 1 tablet (25 mg total) by mouth 3 (three) times daily as needed (hiccups.).   clopidogrel 75 MG tablet Commonly known as: PLAVIX Take 1 tablet (75 mg total) by mouth daily. Restart Plavix after completion of Eliquis prescription.   docusate sodium 100 MG capsule Commonly known as: COLACE Take 300 mg by mouth daily after lunch.   donepezil 10 MG tablet Commonly known as: ARICEPT Take 10 mg by mouth at bedtime.   ezetimibe 10 MG tablet Commonly known as: ZETIA Take 10 mg by mouth every evening.   feeding supplement Liqd Take 237 mLs by mouth 2 (two) times daily between meals.   ipratropium 0.06 % nasal  spray Commonly known as: ATROVENT Place 2-4 sprays into both nostrils at bedtime.   Iron Slow Release 140 (45 Fe) MG Tbcr Generic drug: Ferrous Sulfate Take 1 tablet by mouth 2 (two) times daily.   lidocaine 5 % Commonly known as: LIDODERM Place 1 patch onto the skin daily. Remove & Discard patch within 12 hours or as directed by MD Start taking on: September 02, 2022   melatonin 5 MG Tabs Take 10 mg by mouth at bedtime.   methocarbamol 500 MG tablet Commonly known as: ROBAXIN Take 1 tablet (500 mg total) by mouth  at bedtime as needed for muscle spasms. What changed: when to take this   metoprolol succinate 25 MG 24 hr tablet Commonly known as: TOPROL-XL Take 25 mg by mouth daily.   multivitamin with minerals Tabs tablet Take 1 tablet by mouth daily after lunch.   oxyCODONE-acetaminophen 5-325 MG tablet Commonly known as: PERCOCET/ROXICET Take 2 tablets by mouth every 6 (six) hours.   pantoprazole 40 MG tablet Commonly known as: PROTONIX Take 40 mg by mouth every evening.   polyethylene glycol 17 g packet Commonly known as: MIRALAX / GLYCOLAX Take 17 g by mouth daily as needed for moderate constipation or mild constipation.   PreserVision AREDS 2 Caps Take 1 tablet by mouth 2 (two) times daily.   PROBIOTIC PO Take 1 capsule by mouth in the morning.   traMADol 50 MG tablet Commonly known as: ULTRAM Take 1 tablet (50 mg total) by mouth every 6 (six) hours as needed for moderate pain. What changed:  when to take this reasons to take this   zolpidem 10 MG tablet Commonly known as: AMBIEN Take 1 tablet (10 mg total) by mouth at bedtime.               Discharge Care Instructions  (From admission, onward)           Start     Ordered   09/01/22 0000  No dressing needed        09/01/22 1516            Follow-up Information     Idelle Crouch, MD. Schedule an appointment as soon as possible for a visit in 1 week(s).   Specialty: Internal  Medicine Contact information: [address] [place] [address] [phone]         Tsosie Billing, MD Follow up.   Specialty: Infectious Diseases Why: According to your appointment on 10/05/2022 Contact information: [place] [address] [phone]                Discharge Exam: Danley Danker Weights   08/30/22 0500 08/31/22 0526 09/01/22 0500  Weight: 88.5 kg 83.9 kg 87 kg   General.  Frail elderly man, in no acute distress. Pulmonary.  Lungs clear bilaterally, normal respiratory effort. CV.  Regular rate and rhythm, no JVD, rub or murmur. Abdomen.  Soft, nontender, nondistended, BS positive. CNS.  Alert and oriented .  No focal neurologic deficit. Extremities.  No edema, no cyanosis, pulses intact and symmetrical. Psychiatry.  Judgment and insight appears normal.   Condition at discharge: stable  The results of significant diagnostics from this hospitalization (including imaging, microbiology, ancillary and laboratory) are listed below for reference.   Imaging Studies: NM WBC SCAN TUMOR LOC LIMITED  Result Date: 08/26/2022 CLINICAL DATA:  Severe low back pain. Evaluate for possible discitis/osteomyelitis. EXAM: NUCLEAR MEDICINE LEUKOCYTE SCAN TECHNIQUE: Following intravenous administration of radiolabeled white blood cells, images of the head, neck, trunk, and extremities were obtained on subsequent days. RADIOPHARMACEUTICALS:  6.95 Tc 99 Ceretec labeled autologous leukocytes IV COMPARISON:  CT scan 08/14/2022 FINDINGS: No areas of abnormal uptake are identified in the spine to suggest discitis or osteomyelitis. Normal uptake in the liver, spleen kidneys with excretion of the bladder. There is also normal excretion into the gallbladder. Stable aortoiliac stent graft for abdominal aortic aneurysm. No obvious complication. Upper left renal calculus is stable. Small layering gallstones are noted the gallbladder.  IMPRESSION: No findings for discitis/osteomyelitis. Electronically Signed  By: Marijo Sanes M.D.   On: 08/26/2022 18:29   ECHO TEE  Result Date: 08/23/2022    TRANSESOPHOGEAL ECHO REPORT   Patient Name:   Glendell Higinio Grow. Date of Exam: 08/19/2022 Medical Rec #:  [id number]             Height:       71.0 in Accession #:    [id number]            Weight:       205.9 lb Date of Birth:  [birth year]             BSA:          2.135 m Patient Age:    75 years              BP:           143/91 mmHg Patient Gender: M                     HR:           90 bpm. Exam Location:  [facility] Procedure: Transesophageal Echo, Cardiac Doppler and Color Doppler Indications:     Not listed on TEE check-in sheet  History:         Patient has prior history of Echocardiogram examinations, most                  recent 08/17/2022. Pacemaker; Risk Factors:Hypertension.                  Anxiety.  Sonographer:     Sherrie Sport Referring Phys:  [id number] Laurel TANG Diagnosing Phys: Serafina Royals MD PROCEDURE: The transesophogeal probe was passed without difficulty through the esophogus of the patient. Sedation performed by performing physician. The patient developed no complications during the procedure. IMPRESSIONS  1. Left ventricular ejection fraction, by estimation, is 55 to 60%. The left ventricle has normal function.  2. Right ventricular systolic function is normal. The right ventricular size is normal.  3. Left atrial size was mildly dilated. No left atrial/left atrial appendage thrombus was detected.  4. Right atrial size was mildly dilated.  5. The mitral valve is normal in structure. Mild mitral valve regurgitation.  6. The aortic valve is normal in structure. Aortic valve regurgitation is trivial.  7. Evidence of atrial level shunting detected by color flow Doppler. Comparison(s):  FINDINGS  Left Ventricle: Right atrial pacer lead appears to have migrated into lv across mitral valve. Left ventricular ejection fraction, by  estimation, is 55 to 60%. The left ventricle has normal function. The left ventricular internal cavity size was normal in size. Right Ventricle: Pacer wires noted and without vegetation. The right ventricular size is normal. No increase in right ventricular wall thickness. Right ventricular systolic function is normal. Left Atrium: Left atrial size was mildly dilated. No left atrial/left atrial appendage thrombus was detected. Right Atrium: Right atrial size was mildly dilated. Pericardium: There is no evidence of pericardial effusion. Mitral Valve: The mitral valve is normal in structure. Mild mitral valve regurgitation. There is no evidence of mitral valve vegetation. Tricuspid Valve: The tricuspid valve is normal in structure. Tricuspid valve regurgitation is mild. There is no evidence of tricuspid valve vegetation. Aortic Valve: The aortic valve is normal in structure. Aortic valve regurgitation is trivial. There is no evidence of aortic valve vegetation. Pulmonic Valve: The pulmonic valve was normal in structure. Pulmonic valve regurgitation is trivial. There is no evidence  of pulmonic valve vegetation. Aorta: The aortic root and ascending aorta are structurally normal, with no evidence of dilitation. IAS/Shunts: Evidence of atrial level shunting detected by color flow Doppler. Serafina Royals MD Electronically signed by Serafina Royals MD Signature Date/Time: 08/23/2022/5:29:37 PM    Final    CT LUMBAR SPINE W CONTRAST  Result Date: 08/17/2022 CLINICAL DATA:  Low back pain EXAM: CT LUMBAR SPINE WITH CONTRAST TECHNIQUE: Multidetector CT imaging of the lumbar spine was performed with intravenous contrast administration. RADIATION DOSE REDUCTION: This exam was performed according to the departmental dose-optimization program which includes automated exposure control, adjustment of the mA and/or kV according to patient size and/or use of iterative reconstruction technique. CONTRAST:  15mL OMNIPAQUE IOHEXOL 350  MG/ML SOLN COMPARISON:  11/16/2016 CT lumbar spine FINDINGS: Segmentation: 5 lumbar-type vertebral bodies. Alignment: 3 mm retrolisthesis of L1 on L2 and L2 on L3, unchanged. 12 mm anterolisthesis L5 on S1 (grade 2), unchanged. Dextrocurvature of the lumbar spine with compensatory levocurvature of the lower lumbar spine. 6 mm left lateral listhesis of L1 on L2 4 mm right lateral listhesis of L2 on L3. 6 mm right lateral listhesis of L3 on L4. Lateral listhesis also appear unchanged compared to prior exam. Vertebrae: No acute fracture or suspicious osseous lesion. Bilateral L5 pars defects. Status post L5-S1 posterior fusion with evidence of osseous fusion across the disc space. Endplate degenerative changes at L2-L3, eccentric to the left, and L4-L5, eccentric to the right. Paraspinal and other soft tissues: Aortic aneurysm, status post EVAR, which is better evaluated on the 08/14/2022 CTA abdomen pelvis. Redemonstrated left renal cyst, for which no follow-up is currently indicated. Nonobstructing left renal calculus. Disc levels: T12-L1: Left eccentric disc bulge. Mild facet arthropathy. No spinal canal stenosis or significant neural foraminal narrowing. L1-L2: Mild retrolisthesis with mild disc bulge. Mild facet arthropathy. No spinal canal stenosis or neural foraminal narrowing. L2-L3: Mild retrolisthesis and mild disc bulge. Mild facet arthropathy. Narrowing of the lateral recesses. No spinal canal stenosis or neural foraminal narrowing. L3-L4: Mild disc bulge. Mild facet arthropathy. No spinal canal stenosis or neural foraminal narrowing. L4-L5: Right-greater-than-left disc height loss and mild disc bulge. Moderate facet arthropathy. No spinal canal stenosis. Mild bilateral neural foraminal narrowing. L5-S1: Redemonstrated bilateral pars defect with grade 2 anterolisthesis. No spinal canal stenosis. Moderate bilateral neural foraminal narrowing. IMPRESSION: 1. Status post L5-S1 posterior fusion, with evidence  of osseous fusion across the disc space. 2. 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 3. Multilevel degenerative disc disease, worst at L4-L5 and L5-S1, with mild bilateral neural foraminal narrowing at L4-L5 and moderate bilateral neural foraminal narrowing at L5-S1. 4. No spinal canal stenosis. 5. No acute fracture or traumatic listhesis. Electronically Signed   By: Merilyn Baba M.D.   On: 08/17/2022 18:25   CT Angio Chest Pulmonary Embolism (PE) W or WO Contrast  Result Date: 08/17/2022 CLINICAL DATA:  Known pulmonary embolism, follow-up EXAM: CT ANGIOGRAPHY CHEST WITH CONTRAST TECHNIQUE: Multidetector CT imaging of the chest was performed using the standard protocol during bolus administration of intravenous contrast. Multiplanar CT image reconstructions and MIPs were obtained to evaluate the vascular anatomy. RADIATION DOSE REDUCTION: This exam was performed according to the departmental dose-optimization program which includes automated exposure control, adjustment of the mA and/or kV according to patient size and/or use of iterative reconstruction technique. CONTRAST:  1mL OMNIPAQUE IOHEXOL 350 MG/ML SOLN COMPARISON:  08/14/2022 FINDINGS: Cardiovascular: Extensive atherosclerotic calcifications aorta, proximal great vessels, and  coronary arteries. 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 Enlargement of cardiac chambers. Pacemaker leads LEFT ventricle and RIGHT atrial appendage. No pericardial effusion. Pulmonary arteries adequately opacified and patent. No pulmonary emboli identified. Specifically, pulmonary embolus in LEFT upper lobe pulmonary arterial branch on previous exam no longer seen. Mediastinum/Nodes: Esophagus unremarkable. Base of cervical region normal appearance. Beam hardening artifacts from shoulder prostheses traverse chest. Significant mucus within RIGHT mainstem bronchus extending into bronchus intermedius and RIGHT lower  lobe. No adenopathy. Lungs/Pleura: BILATERAL pleural effusions. Emphysematous changes. Mild compressive atelectasis RIGHT lower lobe. No infiltrate or pneumothorax. Upper Abdomen: No significant upper abdominal findings. Musculoskeletal: Osseous structures unremarkable. Review of the MIP images confirms the above findings. IMPRESSION: No evidence of pulmonary embolism; resolution of small pulmonary embolus in a LEFT upper lobe pulmonary arterial branch seen on previous study. Significant mucus within RIGHT mainstem bronchus extending into bronchus intermedius and RIGHT lower lobe. BILATERAL pleural effusions and compressive atelectasis RIGHT lower lobe. Extensive atherosclerotic calcifications including coronary arteries. 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 This recommendation follows 2010 ACCF/AHA/AATS/ACR/ASA/SCA/SCAI/SIR/STS/SVM Guidelines for the Diagnosis and Management of Patients with Thoracic Aortic Disease. Circulation. 2010; 121: Y403-K742. Aortic aneurysm NOS (ICD10-I71.9) Aortic Atherosclerosis (ICD10-I70.0). Aortic aneurysm NOS (ICD10-I71.9). Emphysema (ICD10-J43.9). Electronically Signed   By: Lavonia Dana M.D.   On: 08/17/2022 17:18   ECHOCARDIOGRAM COMPLETE  Result Date: 08/17/2022    ECHOCARDIOGRAM REPORT   Patient Name:   Durward Aydn Ferrara. Date of Exam: 08/17/2022 Medical Rec #:  [id number]             Height:       71.0 in Accession #:    [id number]            Weight:       189.8 lb Date of Birth:  [birth year]             BSA:          2.062 m Patient Age:    72 years              BP:           170/93 mmHg Patient Gender: M                     HR:           88 bpm. Exam Location:  [facility] Procedure: 2D Echo, Cardiac Doppler and Color Doppler Indications:     Bacteremia R78.81  History:         Patient has prior history of Echocardiogram examinations, most                  recent 06/14/2022. CHF, Prior CABG and  Pacemaker, COPD; Risk                  Factors:Former Smoker and Hypertension.  Sonographer:     Sherrie Sport Referring Phys:  Van Voorhis DEW Diagnosing Phys: Yolonda Kida MD  Sonographer Comments: Technically difficult study due to poor echo windows, no apical window and no subcostal window. IMPRESSIONS  1. Left ventricular ejection fraction, by estimation, is 60 to 65%. The left ventricle has normal function. The left ventricle has no regional wall motion abnormalities. Left ventricular diastolic parameters were normal.  2. Right ventricular systolic function is normal. The right ventricular size is normal.  3. The mitral valve is normal in structure. No evidence of mitral valve  regurgitation.  4. The aortic valve is normal in structure. Aortic valve regurgitation is not visualized. FINDINGS  Left Ventricle: Left ventricular ejection fraction, by estimation, is 60 to 65%. The left ventricle has normal function. The left ventricle has no regional wall motion abnormalities. The left ventricular internal cavity size was normal in size. There is  no left ventricular hypertrophy. Left ventricular diastolic parameters were normal. Right Ventricle: The right ventricular size is normal. No increase in right ventricular wall thickness. Right ventricular systolic function is normal. Left Atrium: Left atrial size was normal in size. Right Atrium: Right atrial size was normal in size. Pericardium: There is no evidence of pericardial effusion. Mitral Valve: The mitral valve is normal in structure. No evidence of mitral valve regurgitation. Tricuspid Valve: The tricuspid valve is normal in structure. Tricuspid valve regurgitation is trivial. Aortic Valve: The aortic valve is normal in structure. Aortic valve regurgitation is not visualized. Pulmonic Valve: The pulmonic valve was normal in structure. Pulmonic valve regurgitation is not visualized. Aorta: The ascending aorta was not well visualized. IAS/Shunts: No atrial  level shunt detected by color flow Doppler.  LEFT VENTRICLE PLAX 2D LVIDd:         4.90 cm LVIDs:         3.30 cm LV PW:         1.10 cm LV IVS:        1.00 cm LVOT diam:     2.20 cm LVOT Area:     3.80 cm  LEFT ATRIUM         Index LA diam:    4.70 cm 2.28 cm/m   AORTA Ao Root diam: 3.80 cm  SHUNTS Systemic Diam: 2.20 cm Yolonda Kida MD Electronically signed by Yolonda Kida MD Signature Date/Time: 08/17/2022/5:09:42 PM    Final    PERIPHERAL VASCULAR CATHETERIZATION  Result Date: 08/16/2022 See surgical note for result.  CT Angio Abd/Pel w/ and/or w/o  Addendum Date: 08/16/2022   ADDENDUM REPORT: 08/16/2022 08:56 ADDENDUM: Upon re-evaluation of the study, there is a filling defect involving a left upper lobe pulmonary artery subsegmental branch on sequence 6, image 108. This is concerning for a small pulmonary embolism. These results were called by telephone at the time of interpretation on 08/16/2022 at 8:54 am to provider Emeterio Reeve, DO , who verbally acknowledged these results. Electronically Signed   By: Markus Daft M.D.   On: 08/16/2022 08:56   Result Date: 08/16/2022 CLINICAL DATA:  History of endovascular repair of an abdominal aortic aneurysm. Evaluate abdominal aortic aneurysm sac. EXAM: CTA ABDOMEN AND PELVIS WITHOUT AND WITH CONTRAST TECHNIQUE: Multidetector CT imaging of the abdomen and pelvis was performed using the standard protocol during bolus administration of intravenous contrast. Multiplanar reconstructed images and MIPs were obtained and reviewed to evaluate the vascular anatomy. RADIATION DOSE REDUCTION: This exam was performed according to the departmental dose-optimization program which includes automated exposure control, adjustment of the mA and/or kV according to patient size and/or use of iterative reconstruction technique. CONTRAST:  55mL OMNIPAQUE IOHEXOL 350 MG/ML SOLN COMPARISON:  CT chest abdomen pelvis 08/14/2022 and PET-CT 09/09/2021 FINDINGS: VASCULAR  Aorta: Endovascular repair of an abdominal aortic aneurysm with a bifurcated infrarenal aortic stent graft. Aortic stent graft is patent. The aneurysm sac measures up to 7.6 cm and measured 7.0 cm on 09/09/2021. There is contrast within the posterior proximal aspect of the aneurysm sac likely coming from lumbar arteries. Large amount of contrast within the sac on the  delayed images. 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 No evidence for an aortic sac rupture. Celiac: Patent without evidence of aneurysm, dissection, vasculitis or significant stenosis. Incidentally, there is an accessory left hepatic artery coming off the left gastric artery. SMA: Approximately 50% stenosis involving the origin and proximal aspect of the SMA related to mixed plaque. SMA is patent. Renals: Both renal arteries are patent without evidence of aneurysm, dissection, vasculitis, fibromuscular dysplasia or significant stenosis. IMA: Retrograde filling of the inferior mesenteric artery and there is contrast near the origin. 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 Inflow: Left limb terminates in the distal left common iliac artery. Left internal and external iliac arteries are patent. There is focal dilatation in the left internal iliac artery measuring up to 1.2 cm. Right limb extends into the right external iliac artery and there has been endovascular embolization of the right internal iliac artery origin. 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 Right external iliac artery is patent. Proximal Outflow: Proximal femoral arteries are patent bilaterally Veins: IVC and renal veins are patent. No gross abnormality to the iliac veins. Review of the MIP images confirms the above findings. NON-VASCULAR Lower chest: Emphysema. 8 mm nodule in the left upper lobe on sequence 7, image 28 is stable since 09/18/2019. No large pleural effusion. Again noted is  a cardiac pacemaker with a lead extending through an interatrial defect and appears to be terminating in the left ventricle. The other cardiac lead is in the right atrial appendage. Pacemaker leads appear to be chronic. Post CABG changes. Hepatobiliary: Normal appearance of the liver and gallbladder. Pancreas: Unremarkable. No pancreatic ductal dilatation or surrounding inflammatory changes. Spleen: 9 mm hyper hypodensity in the spleen appears chronic no acute abnormality. Adrenals/Urinary Tract: Normal appearance of the adrenal glands. Both kidneys are atrophic without hydronephrosis. Low-density cyst in left kidney again noted and do not require dedicated follow-up. 9 mm stone in the left kidney upper pole. Normal appearance of the urinary bladder. Small calcification in the right kidney upper pole region could be vascular in etiology. Stomach/Bowel: Rectum is distended with gas and stool. No evidence for bowel obstruction or focal bowel inflammation. Normal appearance of the stomach. Lymphatic: No significant lymph node enlargement in the abdomen or pelvis. Reproductive: Evidence for prostatectomy. Other: Negative for free fluid.  Negative for free air. Musculoskeletal: There is a intramedullary nail in the right humerus which is incompletely imaged. Right hip arthroplasty is located. Bilateral pedicle screw and rod fixation at L5-S1. Stable anterolisthesis of L5 on S1 with disc space loss at L5-S1. Disc space narrowing at L4-L5. Chronic mild retrolisthesis of L2 on L3. IMPRESSION: VASCULAR 1. 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 The bifurcated aortic stent graft is patent but the aneurysm sac is enlarging. Aneurysm sac measures up to 7.6 cm and measured 7.0 cm on 09/09/2021. 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 2. 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 3. Unusual location for a cardiac lead that appears to  be extending through in interatrial defect and terminating in the left ventricle. This is a chronic finding. NON-VASCULAR 1. No acute abnormality in the abdomen or pelvis. 2. Atrophy in both kidneys. 3. Aortic Atherosclerosis (ICD10-I70.0) and Emphysema (ICD10-J43.9). 4. Nonobstructive left nephrolithiasis. Electronically Signed: By: Markus Daft M.D. On: 08/14/2022 15:29   CT  CHEST ABDOMEN PELVIS WO CONTRAST  Result Date: 08/14/2022 CLINICAL DATA:  Pneumonia, complications suspected. Low back pain onset 2 days ago worsening with movement. Shortness of breath. EXAM: CT CHEST, ABDOMEN AND PELVIS WITHOUT CONTRAST TECHNIQUE: Multidetector CT imaging of the chest, abdomen and pelvis was performed following the standard protocol without IV contrast. RADIATION DOSE REDUCTION: This exam was performed according to the departmental dose-optimization program which includes automated exposure control, adjustment of the mA and/or kV according to patient size and/or use of iterative reconstruction technique. COMPARISON:  PET-CT skull base to pelvis 09/09/2021, CT abdomen pelvis without contrast 03/31/2021. Most recent chest x-ray was portable chest today, portable chest 08/13/2022. FINDINGS: CT CHEST FINDINGS Cardiovascular: There is mild cardiomegaly. Metal artifact from left chest dual lead pacing system and dual lead wires in the heart. There are CABG changes. Aortic tortuosity and moderate patchy arthrosclerosis with aortic root ectasia up to 3.8 cm and ascending aorta with mild dilatation to 4.2 cm. The remainder is within normal caliber limits with scattered calcific plaque in the great vessels. There is a prominent pulmonary trunk 3.4 cm indicating arterial hypertension, unchanged. There are normal caliber pulmonary veins. Mediastinum/Nodes: No intrathoracic or axillary adenopathy. Thyroid gland obscured by metallic artifact from bilateral shoulder replacements and left chest pacemaker. Small amount of retained  secretions at the right posterolateral tracheal wall, otherwise unremarkable trachea and main bronchi. Lungs/Pleura: Chronic changes in the bases and moderate emphysematous disease with both paraseptal and centrilobular changes. Right lower lobectomy with volume loss and mild asymmetric elevation of the right diaphragm. There is mild bronchial thickening. No pneumonic infiltrate or nodule is seen. No pleural effusion, thickening or pneumothorax. Musculoskeletal: There is osteopenia, degenerative disc disease and spondylosis of the thoracic spine. As above there are bilateral shoulder replacements. CT ABDOMEN PELVIS FINDINGS Hepatobiliary: The liver is unremarkable without contrast. The gallbladder and bile ducts are unremarkable. Pancreas: No focal abnormality. Spleen: Chronic subcentimeter low-attenuation lesion in the central spleen. Probable cyst or hemangioma. Stable. Otherwise unremarkable without contrast. Adrenals/Urinary Tract: There is no adrenal mass. There is bilateral renal cortical thinning. Stable left renal cysts and 1 cm nonobstructive caliceal stone in the superior pole. 2 mm stone or renovascular calcification upper pole right kidney. Perinephric stranding is also similar. There is no ureteral stone or hydronephrosis. The right side of the bladder obscured by interval new right hip replacement. The visualized bladder normal in thickness. Stomach/Bowel: Small hiatal hernia. Contracted stomach with normal caliber unopacified small bowel. Normal appendix which is well visible. Mild-to-moderate stool retention in the colon including the rectum but no rectal wall thickening or inflammation. Scattered sigmoid diverticula without diverticulitis. Vascular/Lymphatic: Aortoiliac heavy calcific plaques are again noted with aorto bi-iliac stent graft. A large excluded aneurysm sac is again noted and is larger than previously. On the 2 prior studies, the aneurysmal sac measured 6.9 x 7.0 cm, today measuring 7.6  x 7.6 cm. 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 There are endovascular coils in the region of the right internal iliac artery which were noted previously. Reproductive: Old prostatectomy. Other: There are tiny umbilical and inguinal fat hernias. There is no incarcerated hernia. There is no free hemorrhage, free fluid or free air, or inflammatory stranding around the AAA. Multiple pelvic phleboliths. Musculoskeletal: Chronic L5-S1 fusion hardware is again noted with chronic L5 spondylolysis and grade 2 L5-S1 spondylolisthesis, unchanged. There is osteopenia degenerative change of the lumbar spine with discogenic mild grade 1  retrolisthesis unchanged at L1-2 and L2-3, slight dextroscoliosis. Interval right hip replacement. No acute hardware complications. No operative site hematoma. Ankylosis left SI joint. IMPRESSION: 1. COPD and mild chronic bronchitis with prior right lower lobectomy. No focal pneumonia is seen. Chronic prominence of the pulmonary trunk. 2. Aortic and coronary artery atherosclerosis, prior CABG, pacemaker, and abdominal aortobi-iliac stent grafting. 3. 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 Annual CTA or MRA follow-up recommended improved 4. 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 CTA would be the preferred imaging workup, MRA if the patient cannot have IV contrast. 5. Constipation and diverticulosis. 6. Nonobstructive nephrolithiasis. 7. Degenerative and postsurgical changes of the spine and osteopenia. No acute spinal compression fracture. Electronically Signed   By: Telford Nab M.D.   On: 08/14/2022 05:23   NM Pulmonary Perfusion  Result Date: 08/14/2022 CLINICAL DATA:  This study identified as missing a report at 4:54 am on 08/14/2022. 81 year old male with shortness of breath for 2 months. EXAM: NUCLEAR MEDICINE PERFUSION LUNG SCAN TECHNIQUE: Perfusion  images were obtained in multiple projections after intravenous injection of radiopharmaceutical. Ventilation scans intentionally deferred if perfusion scan and chest x-ray adequate for interpretation during COVID 19 epidemic. RADIOPHARMACEUTICALS:  4.4 mCi Tc-24m MAA IV COMPARISON:  PA and lateral chest radiographs 08/13/2022. Subsequent CT Chest, Abdomen, and Pelvis 08/14/2022 at 0411 hours. FINDINGS: Fairly homogeneous lung perfusion radiotracer activity when accounting for chronic right lung pleural scarring and costophrenic angle blunting demonstrated radiographically, and centrilobular and paraseptal emphysema demonstrated by CT this morning. No suspicious perfusion defect. IMPRESSION: Chronic lung disease. No scintigraphic evidence of pulmonary embolus. Electronically Signed   By: Genevie Ann M.D.   On: 08/14/2022 04:58   DG Chest 2 View  Result Date: 08/14/2022 CLINICAL DATA:  This study identified as missing a report at 4:54 am on 08/14/2022. 81 year old male with shortness of breath for 2 months. EXAM: CHEST - 2 VIEW COMPARISON:  Portable chest 06/10/2022 and earlier. FINDINGS: Chronic CABG and left chest pacemaker. Chronic bilateral shoulder arthroplasty. Lung volumes and mediastinal contours are stable since 2018. No significant cardiomegaly. Visualized tracheal air column is within normal limits. No pneumothorax or pulmonary edema. Chronic blunting of the right costophrenic angle is not significantly changed since 2018 and appears related to chronic pleural thickening by chest CT in 2021. No acute pulmonary opacity. Stable visualized osseous structures.  Negative visible bowel gas. IMPRESSION: 1. No acute cardiopulmonary abnormality. 2. Chronic right lung pleural thickening, left chest pacemaker, prior CABG. Electronically Signed   By: Genevie Ann M.D.   On: 08/14/2022 04:56   CT Head Wo Contrast  Result Date: 08/14/2022 CLINICAL DATA:  82 year old male with altered mental status. Increasing back pain  with movement. EXAM: CT HEAD WITHOUT CONTRAST TECHNIQUE: Contiguous axial images were obtained from the base of the skull through the vertex without intravenous contrast. RADIATION DOSE REDUCTION: This exam was performed according to the departmental dose-optimization program which includes automated exposure control, adjustment of the mA and/or kV according to patient size and/or use of iterative reconstruction technique. COMPARISON:  Brain MRI 01/14/2010.  Head CT 06/10/2022. 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 Small chronic bilateral cerebellar infarcts. Stable cerebral volume. Patchy encephalomalacia and dystrophic calcification in the right parietal lobe is stable. Small area of chronic encephalomalacia in the right inferior frontal gyrus is stable. No midline shift, ventriculomegaly, mass effect, evidence of  mass lesion, intracranial hemorrhage or evidence of cortically based acute infarction. Vascular: Calcified atherosclerosis at the skull base. No suspicious intracranial vascular hyperdensity. Skull: No acute osseous abnormality identified. Sinuses/Orbits: Mild sinus opacification in the left frontal, frontoethmoidal recess and sphenoid sinus have not significantly changed from last month. And overall paranasal sinuses and mastoids are well aerated. Other: No acute orbit or scalp soft tissue finding. IMPRESSION: 1. No acute intracranial abnormality identified. 2. Advanced chronic ischemic disease appears stable by CT since last month. Electronically Signed   By: Genevie Ann M.D.   On: 08/14/2022 04:54   DG Chest Portable 1 View  Result Date: 08/14/2022 CLINICAL DATA:  Shortness of breath, low back pain EXAM: PORTABLE CHEST 1 VIEW COMPARISON:  08/13/2022 FINDINGS: Mild patchy right lower lobe opacity, new, suspicious for pneumonia. Trace right pleural effusion versus chronic pleural thickening. Left lung is clear. No  pneumothorax. Mild cardiomegaly. Postsurgical changes related to prior CABG. Left subclavian pacemaker. Median sternotomy. IMPRESSION: Mild patchy right lower lobe opacity, new, suspicious for pneumonia. Electronically Signed   By: Julian Hy M.D.   On: 08/14/2022 03:17    Microbiology: Results for orders placed or performed during the hospital encounter of 08/14/22  Culture, blood (routine x 2)     Status: Abnormal   Collection Time: 08/14/22  3:57 AM   Specimen: BLOOD  Result Value Ref Range Status   Specimen Description   Final    BLOOD BLOOD RIGHT ARM Performed at [facility], [address] [place], [place] [address]    Special Requests   Final    BOTTLES DRAWN AEROBIC AND ANAEROBIC Blood Culture adequate volume Performed at [facility], [address] [place], [place] [address]    Culture  Setup Time   Final    Organism ID to follow [place] AND ANAEROBIC BOTTLES CRITICAL RESULT CALLED TO, READ BACK BY AND VERIFIED WITH: PHARMD CHILDS AT 1957 08/14/2022 GAA Performed at [facility], [address] [place], [place] [address]    Culture ENTEROCOCCUS FAECALIS (A)  Final   Report Status 08/17/2022 FINAL  Final   Organism ID, Bacteria ENTEROCOCCUS FAECALIS  Final      Susceptibility   Enterococcus faecalis - MIC*    AMPICILLIN <=2 SENSITIVE Sensitive     VANCOMYCIN 1 SENSITIVE Sensitive     GENTAMICIN SYNERGY SENSITIVE Sensitive     * ENTEROCOCCUS FAECALIS  Culture, blood (routine x 2)     Status: Abnormal   Collection Time: 08/14/22  3:57 AM   Specimen: BLOOD  Result Value Ref Range Status   Specimen Description   Final    BLOOD BLOOD LEFT ARM Performed at [facility], [address] [place], [place] [address]    Special Requests   Final    BOTTLES DRAWN AEROBIC AND ANAEROBIC Blood Culture adequate volume Performed at [facility], [address] [place], [place] [address]     Culture  Setup Time   Final    GRAM POSITIVE COCCI AEROBIC BOTTLE ONLY CRITICAL RESULT CALLED TO, READ BACK BY AND VERIFIED WITH: PHARMD CHILDS AT 1957 08/14/2022 GAA GRAM STAIN REVIEWED-AGREE WITH RESULT Performed at [facility], [address] [place], [place] [address]    Culture (A)  Final    ENTEROCOCCUS FAECALIS SUSCEPTIBILITIES PERFORMED ON PREVIOUS CULTURE WITHIN THE LAST 5 DAYS. Performed at [facility], [place] [address], [place],  [address]    Report Status 08/17/2022  FINAL  Final  Resp Panel by RT-PCR (Flu A&B, Covid) Anterior Nasal Swab     Status: None   Collection Time: 08/14/22  3:57 AM   Specimen: Anterior Nasal Swab  Result Value Ref Range Status   SARS Coronavirus 2 by RT PCR NEGATIVE NEGATIVE Final    Comment: (NOTE) SARS-CoV-2 target nucleic acids are NOT DETECTED.  The SARS-CoV-2 RNA is generally detectable in upper respiratory specimens during the acute phase of infection. The lowest concentration of SARS-CoV-2 viral copies this assay can detect is 138 copies/mL. A negative result does not preclude SARS-Cov-2 infection and should not be used as the sole basis for treatment or other patient management decisions. A negative result may occur with  improper specimen collection/handling, submission of specimen other than nasopharyngeal swab, presence of viral mutation(s) within the areas targeted by this assay, and inadequate number of viral copies(<138 copies/mL). A negative result must be combined with clinical observations, patient history, and epidemiological information. The expected result is Negative.  Fact Sheet for Patients:  EntrepreneurPulse.com.au  Fact Sheet for Healthcare Providers:  IncredibleEmployment.be  This test is no t yet approved or cleared by the [place] FDA and  has been authorized for detection and/or diagnosis of SARS-CoV-2 by FDA under an Emergency Use Authorization  (EUA). This EUA will remain  in effect (meaning this test can be used) for the duration of the COVID-19 declaration under Section 564(b)(1) of the Act, 21 U.S.C.section 360bbb-3(b)(1), unless the authorization is terminated  or revoked sooner.       Influenza A by PCR NEGATIVE NEGATIVE Final   Influenza B by PCR NEGATIVE NEGATIVE Final    Comment: (NOTE) The Xpert Xpress SARS-CoV-2/FLU/RSV plus assay is intended as an aid in the diagnosis of influenza from Nasopharyngeal swab specimens and should not be used as a sole basis for treatment. Nasal washings and aspirates are unacceptable for Xpert Xpress SARS-CoV-2/FLU/RSV testing.  Fact Sheet for Patients: EntrepreneurPulse.com.au  Fact Sheet for Healthcare Providers: IncredibleEmployment.be  This test is not yet approved or cleared by the [place] FDA and has been authorized for detection and/or diagnosis of SARS-CoV-2 by FDA under an Emergency Use Authorization (EUA). This EUA will remain in effect (meaning this test can be used) for the duration of the COVID-19 declaration under Section 564(b)(1) of the Act, 21 U.S.C. section 360bbb-3(b)(1), unless the authorization is terminated or revoked.  Performed at [facility], [address] [place], [place] [address]   Blood Culture ID Panel (Reflexed)     Status: Abnormal   Collection Time: 08/14/22  3:57 AM  Result Value Ref Range Status   Enterococcus faecalis DETECTED (A) NOT DETECTED Final    Comment: CRITICAL RESULT CALLED TO, READ BACK BY AND VERIFIED WITH: PHARMD CHILDS AT 1957 08/14/2022 GAA    Enterococcus Faecium NOT DETECTED NOT DETECTED Final   Listeria monocytogenes NOT DETECTED NOT DETECTED Final   Staphylococcus species NOT DETECTED NOT DETECTED Final   Staphylococcus aureus (BCID) NOT DETECTED NOT DETECTED Final   Staphylococcus epidermidis NOT DETECTED NOT DETECTED Final   Staphylococcus lugdunensis NOT  DETECTED NOT DETECTED Final   Streptococcus species NOT DETECTED NOT DETECTED Final   Streptococcus agalactiae NOT DETECTED NOT DETECTED Final   Streptococcus pneumoniae NOT DETECTED NOT DETECTED Final   Streptococcus pyogenes NOT DETECTED NOT DETECTED Final   A.calcoaceticus-baumannii NOT DETECTED NOT DETECTED Final   Bacteroides fragilis NOT DETECTED NOT DETECTED Final   Enterobacterales NOT DETECTED NOT DETECTED Final   Enterobacter  cloacae complex NOT DETECTED NOT DETECTED Final   Escherichia coli NOT DETECTED NOT DETECTED Final   Klebsiella aerogenes NOT DETECTED NOT DETECTED Final   Klebsiella oxytoca NOT DETECTED NOT DETECTED Final   Klebsiella pneumoniae NOT DETECTED NOT DETECTED Final   Proteus species NOT DETECTED NOT DETECTED Final   Salmonella species NOT DETECTED NOT DETECTED Final   Serratia marcescens NOT DETECTED NOT DETECTED Final   Haemophilus influenzae NOT DETECTED NOT DETECTED Final   Neisseria meningitidis NOT DETECTED NOT DETECTED Final   Pseudomonas aeruginosa NOT DETECTED NOT DETECTED Final   Stenotrophomonas maltophilia NOT DETECTED NOT DETECTED Final   Candida albicans NOT DETECTED NOT DETECTED Final   Candida auris NOT DETECTED NOT DETECTED Final   Candida glabrata NOT DETECTED NOT DETECTED Final   Candida krusei NOT DETECTED NOT DETECTED Final   Candida parapsilosis NOT DETECTED NOT DETECTED Final   Candida tropicalis NOT DETECTED NOT DETECTED Final   Cryptococcus neoformans/gattii NOT DETECTED NOT DETECTED Final   Vancomycin resistance NOT DETECTED NOT DETECTED Final    Comment: Performed at [facility], [address], [place], [place] [address]  Urine Culture     Status: Abnormal   Collection Time: 08/14/22  6:54 AM   Specimen: Urine, Clean Catch  Result Value Ref Range Status   Specimen Description   Final    URINE, CLEAN CATCH Performed at [facility], [address], [place], [place] [address]    Special Requests   Final     NONE Performed at [facility], [address] [place], [place] [address]    Culture >=100,000 COLONIES/mL ENTEROCOCCUS FAECALIS (A)  Final   Report Status 08/16/2022 FINAL  Final   Organism ID, Bacteria ENTEROCOCCUS FAECALIS (A)  Final      Susceptibility   Enterococcus faecalis - MIC*    AMPICILLIN <=2 SENSITIVE Sensitive     NITROFURANTOIN <=16 SENSITIVE Sensitive     VANCOMYCIN 1 SENSITIVE Sensitive     * >=100,000 COLONIES/mL ENTEROCOCCUS FAECALIS  Culture, blood (Routine X 2) w Reflex to ID Panel     Status: None   Collection Time: 08/15/22 12:22 PM   Specimen: Right Antecubital; Blood  Result Value Ref Range Status   Specimen Description RIGHT ANTECUBITAL  Final   Special Requests   Final    BOTTLES DRAWN AEROBIC AND ANAEROBIC Blood Culture adequate volume   Culture   Final    NO GROWTH 5 DAYS Performed at [facility], [address] [place], [place] [address]    Report Status 08/20/2022 FINAL  Final  Culture, blood (Routine X 2) w Reflex to ID Panel     Status: None   Collection Time: 08/15/22 12:22 PM   Specimen: BLOOD RIGHT ARM  Result Value Ref Range Status   Specimen Description BLOOD RIGHT ARM  Final   Special Requests   Final    BOTTLES DRAWN AEROBIC AND ANAEROBIC Blood Culture adequate volume   Culture   Final    NO GROWTH 5 DAYS Performed at [facility], [address] [place], [place] [address]    Report Status 08/20/2022 FINAL  Final    Labs: CBC: Recent Labs  Lab 08/26/22 0450  WBC 8.2  HGB 8.2*  HCT 26.6*  MCV 88.4  PLT 254   Basic Metabolic Panel: Recent Labs  Lab 08/26/22 0450  NA 135  K 3.6  CL 104  CO2 22  GLUCOSE 87  BUN 28*  CREATININE 1.85*  CALCIUM 8.4*  PHOS 3.3   Liver Function Tests: Recent Labs  Lab 08/26/22 0450  ALBUMIN 2.2*   CBG: No results for input(s): "GLUCAP" in the last 168 hours.  Discharge time spent: greater than 30 minutes.  This record has been created using  Systems analyst. Errors have been sought and corrected,but may not always be located. Such creation errors do not reflect on the standard of care.   Signed: Lorella Nimrod, MD [facility] 09/01/2022

## 2022-09-01 NOTE — TOC Progression Note (Addendum)
Transition of Care (TOC) - Progression Note    Patient Details  Name: Rick Mcbride. MRN: [id number] Date of Birth: 1941-01-16  Transition of Care [facility]) CM/SW Contact  Alberteen Sam, [place] Phone Number: 09/01/2022, 1:35 PM  Clinical Narrative:     Update: SNF approved for 7 days Auth ID [id number]  [facility] approved: [id number] SNF not approved yet, Dr. Amalia Hailey want Dr. Reesa Chew to call regarding questions:  Dr. Reesa Chew to call [name] at [phone]   Expected Discharge Plan: [place] Barriers to Discharge: Continued Medical Work up  Expected Discharge Plan and Services Expected Discharge Plan: [place]: [place] arrangements for the past 2 months: Single Family Home                                       Social Determinants of Health (SDOH) Interventions    Readmission Risk Interventions    06/13/2022    4:04 PM  Readmission Risk Prevention Plan  Transportation Screening Complete  PCP or Specialist Appt within 3-5 Days Complete  HRI or Home Care Consult Complete  Social Work Consult for [place] Planning/Counseling Complete  Palliative Care Screening Not Applicable  Medication Review Press photographer) Complete

## 2022-09-01 NOTE — TOC Transition Note (Signed)
Transition of Care [facility]) - CM/SW Discharge Note   Patient Details  Name: Rick Mcbride. MRN: [id number] Date of Birth: May 14, 1941  Transition of Care [facility]) CM/SW Contact:  Alberteen Sam, LCSW Phone Number: 09/01/2022, 3:09 PM   Clinical Narrative:     [facility] Anticipated DC date: 09/01/22 Family notified:spouse [name] Transport by: Johnanna Schneiders  Per MD patient ready for [facility] to [facility]. RN, patient, patient's family, and facility notified of DC. Discharge Summary sent to facility. RN given number for report  [phone] Room 606A. DC packet on chart. Ambulance transport requested for patient.  CSW signing off.  Pricilla Riffle, LCSW    Final next level of care: Skilled Nursing Facility Barriers to Discharge: No Barriers Identified   Patient Goals and CMS Choice Patient states their goals for this hospitalization and ongoing recovery are:: to go home CMS Medicare.gov Compare Post Acute Care list provided to:: Patient Represenative (must comment) (spouse) Choice offered to / list presented to : Spouse  Discharge Placement                Patient to be transferred to facility by: [facility]   Patient and family notified of of transfer: 09/01/22  Discharge Plan and Services     Post Acute Care Choice: [place]                               Social Determinants of Health (SDOH) Interventions     Readmission Risk Interventions    06/13/2022    4:04 PM  Readmission Risk Prevention Plan  Transportation Screening Complete  PCP or Specialist Appt within 3-5 Days Complete  HRI or [place] Complete  Social Work Consult for [place] Jakarie Pember Planning/Counseling Complete  Palliative Care Screening Not Applicable  Medication Review Press photographer) Complete

## 2022-09-01 NOTE — Plan of Care (Signed)
  Problem: Education: Goal: Knowledge of disease or condition will improve Outcome: Progressing Goal: Knowledge of the prescribed therapeutic regimen will improve Outcome: Progressing Goal: Individualized Educational Video(s) Outcome: Progressing   Problem: Activity: Goal: Ability to tolerate increased activity will improve Outcome: Progressing Goal: Will verbalize the importance of balancing activity with adequate rest periods Outcome: Progressing   Problem: Respiratory: Goal: Ability to maintain a clear airway will improve Outcome: Progressing Goal: Levels of oxygenation will improve Outcome: Progressing Goal: Ability to maintain adequate ventilation will improve Outcome: Progressing   Problem: Activity: Goal: Ability to tolerate increased activity will improve Outcome: Progressing   Problem: Clinical Measurements: Goal: Ability to maintain a body temperature in the normal range will improve Outcome: Progressing   Problem: Respiratory: Goal: Ability to maintain adequate ventilation will improve Outcome: Progressing Goal: Ability to maintain a clear airway will improve Outcome: Progressing   Problem: Education: Goal: Knowledge of General Education information will improve Description: Including pain rating scale, medication(s)/side effects and non-pharmacologic comfort measures Outcome: Progressing   Problem: Health Behavior/Discharge Planning: Goal: Ability to manage health-related needs will improve Outcome: Progressing   Problem: Clinical Measurements: Goal: Ability to maintain clinical measurements within normal limits will improve Outcome: Progressing Goal: Will remain free from infection Outcome: Progressing Goal: Diagnostic test results will improve Outcome: Progressing Goal: Respiratory complications will improve Outcome: Progressing Goal: Cardiovascular complication will be avoided Outcome: Progressing   Problem: Activity: Goal: Risk for activity  intolerance will decrease Outcome: Progressing   Problem: Nutrition: Goal: Adequate nutrition will be maintained Outcome: Progressing   Problem: Coping: Goal: Level of anxiety will decrease Outcome: Progressing   Problem: Elimination: Goal: Will not experience complications related to bowel motility Outcome: Progressing Goal: Will not experience complications related to urinary retention Outcome: Progressing   Problem: Pain Managment: Goal: General experience of comfort will improve Outcome: Progressing   Problem: Safety: Goal: Ability to remain free from injury will improve Outcome: Progressing   Problem: Skin Integrity: Goal: Risk for impaired skin integrity will decrease Outcome: Progressing   Problem: Education: Goal: Knowledge of General Education information will improve Description: Including pain rating scale, medication(s)/side effects and non-pharmacologic comfort measures Outcome: Progressing   Problem: Health Behavior/Discharge Planning: Goal: Ability to manage health-related needs will improve Outcome: Progressing   Problem: Clinical Measurements: Goal: Ability to maintain clinical measurements within normal limits will improve Outcome: Progressing Goal: Will remain free from infection Outcome: Progressing Goal: Diagnostic test results will improve Outcome: Progressing Goal: Respiratory complications will improve Outcome: Progressing Goal: Cardiovascular complication will be avoided Outcome: Progressing   Problem: Activity: Goal: Risk for activity intolerance will decrease Outcome: Progressing   Problem: Nutrition: Goal: Adequate nutrition will be maintained Outcome: Progressing   Problem: Coping: Goal: Level of anxiety will decrease Outcome: Progressing   Problem: Elimination: Goal: Will not experience complications related to bowel motility Outcome: Progressing Goal: Will not experience complications related to urinary  retention Outcome: Progressing   Problem: Pain Managment: Goal: General experience of comfort will improve Outcome: Progressing   Problem: Safety: Goal: Ability to remain free from injury will improve Outcome: Progressing   Problem: Skin Integrity: Goal: Risk for impaired skin integrity will decrease Outcome: Progressing

## 2022-09-01 NOTE — Progress Notes (Signed)
Physical Therapy Treatment Patient Details Name: Rick Mcbride. MRN: [id number] DOB: 12-17-1940 Today's Date: 09/01/2022   History of Present Illness Rick T Brain Honeycutt. is a 81 y.o. 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     Patient was recently hospitalized from 8/2 - 8/8 due to right femoral neck fracture.  Patient had surgery and finished the rehab.  Currently he is doing PT/OT at home.  He states that in the past several days, his lower back pain has worsened, difficult walking and doing physical therapy.  The lower back pain is constant, 7 out of 10 in severity, aching, nonradiating.  No loss control of bladder or bowel movement.  Denies new injury.  Patient also reports shortness of breath and dry cough.  No chest pain, fever or chills.  Denies nausea, vomiting, diarrhea or abdominal pain. Pt still has some mild right hip pain.  No symptoms of UTI. Pt had negative PE by V/Q scan which is ordered by his PCP yesterday.    PT Comments    Pt received in bed, agreed to PT session. AROM R LE x 10, AAROM L LE x 5 and limited due to increased back and hip pain. Pt transferred to edge of bed with ModA, HOB raised, and use of side rail for assistance. Once sitting, pt struggled with increased lower back pain and trunk strength to maintain static sitting. Pt was assisted back to bed with +2 to maintain spinal neutral. Positioned to comfort in supine with all needs in reach and bed alarm on. Will continue to progress as tolerated. Continue to  recommend SNF once medically stable.    Recommendations for follow up therapy are one component of a multi-disciplinary discharge planning process, led by the attending physician.  Recommendations may be updated based on patient status, additional functional criteria and insurance authorization.  Follow Up Recommendations  Skilled nursing-short term rehab (<3 hours/day) Can patient physically be transported by private vehicle: No   Assistance Recommended at Discharge Frequent or constant Supervision/Assistance  Patient can return home with the following Two people to help with walking and/or transfers;Two people to help with bathing/dressing/bathroom;Help with stairs or ramp for entrance;Assist for transportation;Assistance with cooking/housework   Equipment Recommendations  None recommended by PT    Recommendations for Other Services       Precautions / Restrictions Precautions Precautions: Fall;Back Precaution Booklet Issued: No Required Braces or Orthoses: Spinal Brace Spinal Brace: Lumbar corset Restrictions Weight Bearing Restrictions: No Other Position/Activity Restrictions:  (Log Roll)     Mobility  Bed Mobility Overal bed mobility: Needs Assistance Bed Mobility: Sit to Supine Rolling: Mod assist Sidelying to sit: Mod assist Supine to sit: HOB elevated, Mod assist Sit to supine: Max assist, +2 for physical assistance Sit to sidelying: Max assist, +2 for physical assistance General bed mobility comments: Pt unable to assist with lying down due to pain and fatigue    Transfers                   General transfer comment: deferred due to increased pain in sitting  this date    Ambulation/Gait                   Stairs             Wheelchair Mobility    Modified Rankin (Stroke Patients Only)       Balance Overall balance assessment: Needs assistance Sitting-balance support: Bilateral upper extremity supported, Feet supported Sitting  balance-Leahy Scale: Fair Sitting balance - Comments:  (Pt tolerated ~8 minutes sitting EOB) Postural control:  (shakey and weak)                                  Cognition Arousal/Alertness: Awake/alert Behavior During Therapy: WFL for tasks assessed/performed Overall Cognitive Status: Within Functional Limits for tasks assessed                                 General Comments: Follow simple commands during session, willing to participate.        Exercises General Exercises - Lower Extremity Ankle Circles/Pumps: AROM, Both, 15 reps Heel Slides: AROM, Right, AAROM, Left, 10 reps Hip ABduction/ADduction: AROM, AAROM, Both, 10 reps Other Exercises Other Exercises: B hip IR x 15 Other Exercises: Cues for pursed lip, slow breaths after each standing trial to promote deep breathing and relaxation.  Pt had a higher level of anxiety in standing which resulted in almost hyperventilation upon sitting, but responded well to cues noted above.    General Comments General comments (skin integrity, edema, etc.):  (Pt and wife educated on POC and benefits of OOB if able.)      Pertinent Vitals/Pain Pain Assessment Pain Assessment: 0-10 Pain Score: 6  Body Language: tense, distressed pacing, fidgeting Pain Location: back during mobility and sitting up Pain Descriptors / Indicators: Discomfort, Aching, Grimacing Pain Intervention(s): Monitored during session, Patient requesting pain meds-RN notified, Repositioned    Home Living                          Prior Function            PT Goals (current goals can now be found in the care plan section) Acute Rehab PT Goals Patient Stated Goal: Feel better    Frequency    Min 2X/week      PT Plan Current plan remains appropriate    Co-evaluation              AM-PAC PT "6 Clicks" Mobility   Outcome Measure  Help needed turning from your back to your side while in a flat bed without using  bedrails?: A Lot Help needed moving from lying on your back to sitting on the side of a flat bed without using bedrails?: A Lot Help needed moving to and from a bed to a chair (including a wheelchair)?: A Lot Help needed standing up from a chair using your arms (e.g., wheelchair or bedside chair)?: A Lot Help needed to walk in hospital room?: Total Help needed climbing 3-5 steps with a railing? : Total 6 Click Score: 10    End of Session         PT Visit Diagnosis: Unsteadiness on feet (R26.81);Other abnormalities of gait and mobility (R26.89);Muscle weakness (generalized) (M62.81);Difficulty in walking, not elsewhere classified (R26.2);Pain Pain - part of body:  (lower back)     Time: 8101-7510 PT  Time Calculation (min) (ACUTE ONLY): 40 min  Charges:  $Therapeutic Exercise: 8-22 mins $Therapeutic Activity: 23-37 mins                    Mikel Cella, PTA    Josie Dixon 09/01/2022, 12:52 PM

## 2022-09-02 DIAGNOSIS — I712 Thoracic aortic aneurysm, without rupture, unspecified: Secondary | ICD-10-CM | POA: Diagnosis not present

## 2022-09-02 DIAGNOSIS — I714 Abdominal aortic aneurysm, without rupture, unspecified: Secondary | ICD-10-CM | POA: Diagnosis not present

## 2022-09-02 DIAGNOSIS — Z5189 Encounter for other specified aftercare: Secondary | ICD-10-CM | POA: Diagnosis not present

## 2022-09-02 DIAGNOSIS — M6281 Muscle weakness (generalized): Secondary | ICD-10-CM | POA: Diagnosis not present

## 2022-09-06 ENCOUNTER — Encounter (INDEPENDENT_AMBULATORY_CARE_PROVIDER_SITE_OTHER): Payer: Self-pay

## 2022-09-06 DIAGNOSIS — R1110 Vomiting, unspecified: Secondary | ICD-10-CM | POA: Diagnosis not present

## 2022-09-06 DIAGNOSIS — R059 Cough, unspecified: Secondary | ICD-10-CM | POA: Diagnosis not present

## 2022-09-06 DIAGNOSIS — Z5189 Encounter for other specified aftercare: Secondary | ICD-10-CM | POA: Diagnosis not present

## 2022-09-07 DIAGNOSIS — M6281 Muscle weakness (generalized): Secondary | ICD-10-CM | POA: Diagnosis not present

## 2022-09-07 DIAGNOSIS — I714 Abdominal aortic aneurysm, without rupture, unspecified: Secondary | ICD-10-CM | POA: Diagnosis not present

## 2022-09-07 DIAGNOSIS — I712 Thoracic aortic aneurysm, without rupture, unspecified: Secondary | ICD-10-CM | POA: Diagnosis not present

## 2022-09-07 DIAGNOSIS — Z5189 Encounter for other specified aftercare: Secondary | ICD-10-CM | POA: Diagnosis not present

## 2022-09-08 DIAGNOSIS — R059 Cough, unspecified: Secondary | ICD-10-CM | POA: Diagnosis not present

## 2022-09-08 DIAGNOSIS — N179 Acute kidney failure, unspecified: Secondary | ICD-10-CM | POA: Diagnosis not present

## 2022-09-08 DIAGNOSIS — E440 Moderate protein-calorie malnutrition: Secondary | ICD-10-CM | POA: Diagnosis not present

## 2022-09-09 DIAGNOSIS — U071 COVID-19: Secondary | ICD-10-CM | POA: Diagnosis not present

## 2022-09-09 DIAGNOSIS — R059 Cough, unspecified: Secondary | ICD-10-CM | POA: Diagnosis not present

## 2022-09-13 DIAGNOSIS — U071 COVID-19: Secondary | ICD-10-CM | POA: Diagnosis not present

## 2022-09-13 DIAGNOSIS — R059 Cough, unspecified: Secondary | ICD-10-CM | POA: Diagnosis not present

## 2022-09-13 DIAGNOSIS — N179 Acute kidney failure, unspecified: Secondary | ICD-10-CM | POA: Diagnosis not present

## 2022-09-14 ENCOUNTER — Other Ambulatory Visit: Payer: Self-pay | Admitting: *Deleted

## 2022-09-14 ENCOUNTER — Telehealth: Payer: Self-pay

## 2022-09-14 DIAGNOSIS — N200 Calculus of kidney: Secondary | ICD-10-CM

## 2022-09-14 NOTE — Telephone Encounter (Signed)
Patient's wife [name] called office to inform provider that patient is currently at [facility] in [place], [place].  Would like office to call rehab facility to confirm they have appointment information [facility] was able to confirm  appointment information.  [facility] [place]  P: [place], RMA

## 2022-09-15 DIAGNOSIS — U071 COVID-19: Secondary | ICD-10-CM | POA: Diagnosis not present

## 2022-09-20 DIAGNOSIS — U071 COVID-19: Secondary | ICD-10-CM | POA: Diagnosis not present

## 2022-09-20 DIAGNOSIS — D509 Iron deficiency anemia, unspecified: Secondary | ICD-10-CM | POA: Diagnosis not present

## 2022-09-20 DIAGNOSIS — I130 Hypertensive heart and chronic kidney disease with heart failure and stage 1 through stage 4 chronic kidney disease, or unspecified chronic kidney disease: Secondary | ICD-10-CM | POA: Diagnosis not present

## 2022-09-23 DIAGNOSIS — R059 Cough, unspecified: Secondary | ICD-10-CM | POA: Diagnosis not present

## 2022-09-23 DIAGNOSIS — R634 Abnormal weight loss: Secondary | ICD-10-CM | POA: Diagnosis not present

## 2022-09-29 ENCOUNTER — Ambulatory Visit: Payer: PPO

## 2022-10-04 DIAGNOSIS — U071 COVID-19: Secondary | ICD-10-CM | POA: Diagnosis not present

## 2022-10-04 DIAGNOSIS — N179 Acute kidney failure, unspecified: Secondary | ICD-10-CM | POA: Diagnosis not present

## 2022-10-04 DIAGNOSIS — D509 Iron deficiency anemia, unspecified: Secondary | ICD-10-CM | POA: Diagnosis not present

## 2022-10-05 ENCOUNTER — Ambulatory Visit: Payer: PPO | Attending: Infectious Diseases | Admitting: Infectious Diseases

## 2022-10-05 ENCOUNTER — Encounter: Payer: Self-pay | Admitting: Infectious Diseases

## 2022-10-05 VITALS — BP 116/77 | HR 87 | Temp 97.1°F

## 2022-10-05 DIAGNOSIS — M4646 Discitis, unspecified, lumbar region: Secondary | ICD-10-CM | POA: Diagnosis not present

## 2022-10-05 DIAGNOSIS — R7881 Bacteremia: Secondary | ICD-10-CM

## 2022-10-05 DIAGNOSIS — Z923 Personal history of irradiation: Secondary | ICD-10-CM | POA: Diagnosis not present

## 2022-10-05 DIAGNOSIS — Z96641 Presence of right artificial hip joint: Secondary | ICD-10-CM | POA: Diagnosis not present

## 2022-10-05 DIAGNOSIS — M549 Dorsalgia, unspecified: Secondary | ICD-10-CM | POA: Diagnosis not present

## 2022-10-05 DIAGNOSIS — I129 Hypertensive chronic kidney disease with stage 1 through stage 4 chronic kidney disease, or unspecified chronic kidney disease: Secondary | ICD-10-CM | POA: Insufficient documentation

## 2022-10-05 DIAGNOSIS — E785 Hyperlipidemia, unspecified: Secondary | ICD-10-CM | POA: Insufficient documentation

## 2022-10-05 DIAGNOSIS — Z951 Presence of aortocoronary bypass graft: Secondary | ICD-10-CM | POA: Insufficient documentation

## 2022-10-05 DIAGNOSIS — C029 Malignant neoplasm of tongue, unspecified: Secondary | ICD-10-CM | POA: Insufficient documentation

## 2022-10-05 DIAGNOSIS — B952 Enterococcus as the cause of diseases classified elsewhere: Secondary | ICD-10-CM | POA: Insufficient documentation

## 2022-10-05 DIAGNOSIS — C61 Malignant neoplasm of prostate: Secondary | ICD-10-CM | POA: Diagnosis not present

## 2022-10-05 DIAGNOSIS — Z8679 Personal history of other diseases of the circulatory system: Secondary | ICD-10-CM | POA: Diagnosis not present

## 2022-10-05 DIAGNOSIS — N390 Urinary tract infection, site not specified: Secondary | ICD-10-CM | POA: Insufficient documentation

## 2022-10-05 DIAGNOSIS — Z950 Presence of cardiac pacemaker: Secondary | ICD-10-CM | POA: Insufficient documentation

## 2022-10-05 DIAGNOSIS — I2510 Atherosclerotic heart disease of native coronary artery without angina pectoris: Secondary | ICD-10-CM | POA: Diagnosis not present

## 2022-10-05 DIAGNOSIS — D649 Anemia, unspecified: Secondary | ICD-10-CM | POA: Diagnosis not present

## 2022-10-05 DIAGNOSIS — G894 Chronic pain syndrome: Secondary | ICD-10-CM | POA: Insufficient documentation

## 2022-10-05 NOTE — Progress Notes (Signed)
NAME: Rick Mcbride.  DOB: 02-Jun-1941  MRN: [id number]  Date/Time: 10/05/2022 9:13 AM   Subjective:   ?pt here with his wife after his recent [facility] hospitlaization [name]. is a 81 y.o. male HT, HLD, rt LL SCC s/p lobectomy, AAA repair , CAD s/p CABG, S/P pacemaker for SA node disease, MCI, L5-S1 fusion, chronic LBP, CKD, RT hip hemiarthroplasty , ca tongue s/p radiation Jan 2023  Was recently in [facility] and now in [facility] Follow up after hospitalization 08/14/22-09/01/22- during that visit he had enterococcus bacteremia, uti due to enterococcus, AAA with some endoleak for which he got coiling of lumbar arteries during that admission, back pain there was concern for epidural abscess, osteo at L5-s1 fusion site. MRI could not be done due to hardware. Has a pacemaker for SA node and TEE was negative for endocarditis' He got 17 days of IV ampicillin and then switched to PO amoxicillin 1 gram TID. He is currently on amoxicillin - 7 th week Pt is in [facility] Had a scrotal tear from rubbing on the chair when he was being moved Back pain better Eating better With help he can get out of bed , stand and pivot to a wheel chair Not able to walk No diarrhea No nausea or vomiting      PMH Pt has been declining since Aug. HE fell on 06/09/22 and came to the ED and noted to have rt femoral fracture- on 06/10/22 he underwent  rt hip bipolar hemiarthroplasty for displaced femoral neck fracture by Dr.Poggi- [facility] and stayed there till 07/23/22   Past Medical History:  Diagnosis Date   AAA (abdominal aortic aneurysm) ([place])    a.) s/p EVAR 01/05/2017. b.) native aneurysm sac 6.6 x 6.9 cm by CT on 03/31/2021   Abnormality of tongue    a.) CT head/neck 07/10/2021 --> asymmetric soft tissue at the RIGHT tongue base with a superficial 8 mm lesion.   Anemia    Aneurysm of right common iliac artery (HCC)    a.) measured 2.7 cm by CT on 03/31/2021    Anxiety    Aortic atherosclerosis (HCC)    Atrophic kidney    B12 deficiency    CAD (coronary artery disease)    Cervical radiculopathy    Chronic airway obstruction (HCC)    Chronic kidney disease (CKD), stage III (moderate) (HCC)    Chronic pain syndrome 06/16/2021   Chronic right shoulder pain 11/12/2019   Chronic tension headaches    Chronic, continuous use of opioids 06/16/2021   Coronary artery disease    DDD (degenerative disc disease), lumbar    Degenerative disc disease, lumbar    with lumbar radiculopathy   Elbow fracture, left    GERD (gastroesophageal reflux disease)    H/O adenomatous polyp of colon    H/O hemorrhoids    HTN (hypertension)    Hyperlipidemia    Meralgia paresthetica    Mild cognitive impairment    Nephrolithiasis    Neuralgia    Numbness of right foot 02/19/2021   Osteoarthritis    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   Presence of permanent cardiac pacemaker    Prostate cancer ([place])    a.) s/p prostatectomy   S/P CABG x 3 05/01/2004   a.) LVEF 40-49%; LIMA-LAD, SVG-OM1, SVG-PDA   Second degree AV block    Sinoatrial node dysfunction (HCC)    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    Past Surgical History:  Procedure Laterality Date   AORTIC INTERVENTION N/A 08/16/2022   Procedure: AORTIC INTERVENTION Type II endoleak;  Surgeon: Algernon Huxley, MD;  Location: [place] [facility];  Service: Cardiovascular;  Laterality: N/A;   CATARACT EXTRACTION Bilateral    COLONOSCOPY     COLONOSCOPY     COLONOSCOPY WITH PROPOFOL N/A 10/20/2015   Procedure: COLONOSCOPY WITH PROPOFOL;  Surgeon: Manya Silvas, MD;  Location: [facility]  ENDOSCOPY;  Service: Endoscopy;  Laterality: N/A;   COLONOSCOPY WITH PROPOFOL N/A 11/28/2020   Procedure: COLONOSCOPY WITH PROPOFOL;  Surgeon: Robert Bellow, MD;  Location: [facility] ENDOSCOPY;  Service: Endoscopy;  Laterality: N/A;   CORONARY ARTERY BYPASS GRAFT N/A 05/01/2004   Procedure: 3v CABG (LIMA-LAD, SVG-OM1, SVG-PDA); Location: [facility]; Surgeon: Ander Gaster, MD   EMBOLIZATION Right 12/27/2016   Procedure: Embolization;  Surgeon: Algernon Huxley, MD;  Location: [place] [facility];  Service: Cardiovascular;  Laterality: Right;   ENDOVASCULAR REPAIR/STENT GRAFT N/A 01/05/2017   Procedure: Endovascular Repair/Stent Graft;  Surgeon: Algernon Huxley, MD;  Location: [place] [facility];  Service: Cardiovascular;  Laterality: N/A;   HIP ARTHROPLASTY Right 06/10/2022   Procedure: ARTHROPLASTY BIPOLAR HIP (HEMIARTHROPLASTY);  Surgeon: [name], MD;  Location: [facility];  Service: Orthopedics;  Laterality: Right;   INSERT / REPLACE / REMOVE PACEMAKER     JOINT REPLACEMENT     shoulder and knees   KNEE ARTHROSCOPY     LUNG LOBECTOMY Right 01/31/2013   Procedure: RIGHT PULMONARY LOBECTOMY; Location: [place]; Surgeon: Nestor Lewandowsky, MD   MICROLARYNGOSCOPY Right 08/10/2021   Procedure: MICRODIRECT LARYNGOSCOPY WITH BIOPSY OF TONGUE BASE;  Surgeon: Beverly Gust, MD;  Location: [facility];  Service: ENT;  Laterality: Right;   PACEMAKER INSERTION  12/2012   Dual chanber pacemaker generator   POLYPECTOMY     POSTERIOR LUMBAR FUSION     Procedure: POSTERIOR LUMBAR INTERBODY FUSION, INTERBODY PROSTHESIS, POSTERIOR LATERAL ARTHRODESIS, POSTERIOR NON-SEGMENTAL INSTRUMENTATION LUMBAR FIVE- SACRAL ONE; Location: [facility]; Surgeon: Newman Pies, MD   PROSTATECTOMY     TEE WITHOUT CARDIOVERSION N/A 08/19/2022   Procedure: TRANSESOPHAGEAL ECHOCARDIOGRAM (TEE);  Surgeon: Corey Skains, MD;  Location: [facility] ORS;  Service: Cardiovascular;  Laterality: N/A;   TONGUE BIOPSY N/A 08/10/2021   Procedure:  TONGUE BIOPSY;  Surgeon: Beverly Gust, MD;  Location: [facility];  Service: ENT;  Laterality: N/A;   TOTAL KNEE ARTHROPLASTY Bilateral    TOTAL SHOULDER ARTHROPLASTY Left 07/10/2015   Procedure: TOTAL SHOULDER ARTHROPLASTY;  Surgeon: [name], MD;  Location: [facility];  Service: Orthopedics;  Laterality: Left;   TOTAL SHOULDER REPLACEMENT      Social History   Socioeconomic History   Marital status: Married    Spouse name: Not on file   Number of children: 2   Years of education: 16   Highest education level: Not on file  Occupational History   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   Patient is right handed.      Live at home with wife   Social Determinants of Health   Financial Resource Strain: Not on file  Food Insecurity: No Food Insecurity (08/14/2022)   Hunger Vital Sign    Worried About Running Out of Food in the Last Year: Never true    Ran Out of Food in the Last Year: Never true  Transportation Needs: No Transportation Needs (08/14/2022)   PRAPARE - Hydrologist (Medical): No    Lack of Transportation (Non-Medical): No  Physical Activity: Not on file  Stress: Not on file  Social Connections: Not on file  Intimate Partner Violence: Not At Risk (08/14/2022)   Humiliation, Afraid, Rape, and Kick questionnaire    Fear of Current or Ex-Partner: No    Emotionally Abused: No    Physically Abused: No    Sexually Abused: No    Family History  Problem Relation Age of Onset   Heart  attack Mother    Heart attack Father    Breast cancer Sister    Asthma Sister    No Known Allergies I? Current Outpatient Medications  Medication Sig Dispense Refill   acetaminophen (TYLENOL) 500 MG tablet Take 500 mg by mouth every 4 (four) hours as needed for moderate pain.     amLODipine (NORVASC) 10 MG tablet Take 1 tablet (10 mg total) by mouth daily.     amoxicillin (AMOXIL) 500 MG capsule Take 2 capsules (1,000 mg total) by mouth every 8 (eight) hours for 106 doses. 212 capsule 0   aspirin EC 81 MG tablet Take 81 mg by mouth daily.      atorvastatin (LIPITOR) 20 MG tablet Take 40 mg by mouth at bedtime.     benzonatate (TESSALON) 200 MG capsule Take 1 capsule (200 mg total) by mouth 2 (two) times daily as needed for cough. 20 capsule 0   calcitRIOL (ROCALTROL) 0.25 MCG capsule Take 0.25 mcg by mouth in the morning.     Calcium Carb-Cholecalciferol (CALCIUM 600 + D PO) Take 1 tablet by mouth daily after lunch.     cetirizine (ZYRTEC) 10 MG tablet Take 10 mg by mouth at bedtime.     chlorproMAZINE (THORAZINE) 25 MG tablet Take 1 tablet (25 mg total) by mouth 3 (three) times daily as needed (hiccups.).     clopidogrel (PLAVIX) 75 MG tablet Take 1 tablet (75 mg total) by mouth daily. Restart Plavix after completion of Eliquis prescription. 30 tablet 0   docusate sodium (COLACE) 100 MG capsule Take 300 mg by mouth daily after lunch.     donepezil (ARICEPT) 10 MG tablet Take 10 mg by mouth at bedtime.     ezetimibe (ZETIA) 10 MG tablet Take 10 mg by mouth every evening.     feeding supplement (ENSURE ENLIVE / ENSURE PLUS) LIQD Take 237 mLs by mouth 2 (two) times daily between meals. 237 mL 12   Ferrous Sulfate (IRON SLOW RELEASE) 140 (45 Fe) MG TBCR Take 1 tablet by  mouth 2 (two) times daily.     ipratropium (ATROVENT) 0.06 % nasal spray Place 2-4 sprays into both nostrils at bedtime.     lidocaine (LIDODERM) 5 % Place 1 patch onto the skin daily. Remove & Discard patch within 12 hours or  as directed by MD 30 patch 0   melatonin 5 MG TABS Take 10 mg by mouth at bedtime.     methocarbamol (ROBAXIN) 500 MG tablet Take 1 tablet (500 mg total) by mouth at bedtime as needed for muscle spasms. (Patient taking differently: Take 500 mg by mouth every evening.)     metoprolol succinate (TOPROL-XL) 25 MG 24 hr tablet Take 25 mg by mouth daily.     Multiple Vitamin (MULTIVITAMIN WITH MINERALS) TABS tablet Take 1 tablet by mouth daily after lunch.     Multiple Vitamins-Minerals (PRESERVISION AREDS 2) CAPS Take 1 tablet by mouth 2 (two) times daily.     oxyCODONE-acetaminophen (PERCOCET/ROXICET) 5-325 MG tablet Take 2 tablets by mouth every 6 (six) hours. 30 tablet 0   pantoprazole (PROTONIX) 40 MG tablet Take 40 mg by mouth every evening.     polyethylene glycol (MIRALAX / GLYCOLAX) 17 g packet Take 17 g by mouth daily as needed for moderate constipation or mild constipation. 14 each 0   Probiotic Product (PROBIOTIC PO) Take 1 capsule by mouth in the morning.     traMADol (ULTRAM) 50 MG tablet Take 1 tablet (50 mg total) by mouth every 6 (six) hours as needed for moderate pain. 30 tablet 0   zolpidem (AMBIEN) 10 MG tablet Take 1 tablet (10 mg total) by mouth at bedtime. 20 tablet 0   No current facility-administered medications for this visit.     Abtx:  Anti-infectives (From admission, onward)    None       REVIEW OF SYSTEMS:  Const: negative fever, negative chills, 40 pound weight loss Eyes: negative diplopia or visual changes, negative eye pain ENT: negative coryza, negative sore throat Resp: negative cough, hemoptysis, dyspnea Cards: negative for chest pain, palpitations, lower extremity edema GU: negative for frequency, dysuria and hematuria GI: Negative for abdominal pain, diarrhea, bleeding, constipation Skin: negative for rash and pruritus Heme: negative for easy bruising and gum/nose bleeding MSK weakness, back pain,   Neurolo:negative for headaches, dizziness,  vertigo, memory problems  Psych: negative for feelings of anxiety, depression  Endocrine: negative for thyroid, diabetes Allergy/Immunology- negative for any medication or food allergies ?  Objective:  VITALS:  BP 116/77   Pulse 87   Temp (!) 97.1 F (36.2 C) (Temporal)   PHYSICAL EXAM:  General: Alert, cooperative, no distress, pale appears stated age. Wheel chair Head: Normocephalic, without obvious abnormality, atraumatic. Eyes: Conjunctivae clear, anicteric sclerae. Pupils are equal ENT Nares normal. No drainage or sinus tenderness. Lips, mucosa, and tongue normal. No Thrush Neck: Supple, symmetrical, no adenopathy, thyroid: non tender no carotid bruit and no JVD. Lungs: Clear to auscultation bilaterally. No Wheezing or Rhonchi. No rales. Heart: Regular rate and rhythm, no murmur, rub or gallop. Abdomen: Soft, Extremities: some ankle edema Skin: not able to examine  Lymph: Cervical, supraclavicular normal. Neurologic: not examined in detail Pertinent Labs N[facility] labs      Microbiology: No results found for this or any previous visit (from the past 240 hour(s)).  IMAGING RESULTS: I have personally reviewed the films ? Impression/Recommendation ?Enterococcus bacteremia with UTI TEE neg Had back pain- hardware present lumbar area- could not do MRI So he is empirically being treate  dlike a vertebral infection After getting IV ampicillin for 17 days he I currently takeing Amoxicillin 1 gram Po Q 8. HE has completed more than 6 weeks- will chage amoxicillin to 561m Po TID for 6 more weeks Spoke to PA faith at the NH She will get labs CBC/CMP/ESR and CRP She says there was only a small tear at the base of the scrotum and it is healing  Anemia   Multiple malignancies- SCC rt LL lung s/p lobectomy SCC tongue s/p radiation   Ca prostate s/p prostatectomy   CAD s/p CABG       Mild cognitive impairment- on aricept   ?follow up 2  months ___________________________________________________ Discussed with patient, wife and provider in the NH Note:  This document was prepared using Dragon voice recognition software and may include unintentional dictation errors.

## 2022-10-05 NOTE — Patient Instructions (Addendum)
You are here for follow up of the recent infection with enterococcus bacteremia and Uti and you also had severe  back pain at the site of the lumbar fusion surgery and there was concern for infection there- as we could not do MRI we decided to treat you for a spine infection- you also have a pacemaker- you are now on week 7 of amoxicillin- will reduce the dose from 1 gram three times a day to 59mg three times a day- this you may need indefinitely because of lumbar hardware and pacemaker. Follow up 3 months I spoke to the PA at [facility] and asked them to do blood test- cbc/cmp/esr/crp

## 2022-10-07 ENCOUNTER — Ambulatory Visit: Payer: PPO | Admitting: Urology

## 2022-10-07 DIAGNOSIS — D509 Iron deficiency anemia, unspecified: Secondary | ICD-10-CM | POA: Diagnosis not present

## 2022-10-07 DIAGNOSIS — R7881 Bacteremia: Secondary | ICD-10-CM | POA: Diagnosis not present

## 2022-10-07 DIAGNOSIS — I130 Hypertensive heart and chronic kidney disease with heart failure and stage 1 through stage 4 chronic kidney disease, or unspecified chronic kidney disease: Secondary | ICD-10-CM | POA: Diagnosis not present

## 2022-10-13 ENCOUNTER — Ambulatory Visit: Payer: PPO | Admitting: Radiation Oncology

## 2022-10-20 DIAGNOSIS — D509 Iron deficiency anemia, unspecified: Secondary | ICD-10-CM | POA: Diagnosis not present

## 2022-10-20 DIAGNOSIS — R059 Cough, unspecified: Secondary | ICD-10-CM | POA: Diagnosis not present

## 2022-10-20 DIAGNOSIS — R7881 Bacteremia: Secondary | ICD-10-CM | POA: Diagnosis not present

## 2022-10-20 DIAGNOSIS — R400 Somnolence: Secondary | ICD-10-CM | POA: Diagnosis not present

## 2022-10-21 ENCOUNTER — Non-Acute Institutional Stay: Payer: PPO | Admitting: Nurse Practitioner

## 2022-10-21 ENCOUNTER — Telehealth: Payer: Self-pay | Admitting: *Deleted

## 2022-10-21 DIAGNOSIS — R0602 Shortness of breath: Secondary | ICD-10-CM

## 2022-10-21 DIAGNOSIS — R634 Abnormal weight loss: Secondary | ICD-10-CM

## 2022-10-21 DIAGNOSIS — R630 Anorexia: Secondary | ICD-10-CM

## 2022-10-21 DIAGNOSIS — Z515 Encounter for palliative care: Secondary | ICD-10-CM

## 2022-10-21 DIAGNOSIS — J168 Pneumonia due to other specified infectious organisms: Secondary | ICD-10-CM

## 2022-10-21 NOTE — Telephone Encounter (Signed)
Patient's wife called to request that we cancel his future appointments with Dr. Baruch Gouty and the PET scan as patient is declining and will soon enter Hospice.

## 2022-10-21 NOTE — Progress Notes (Addendum)
[facility] Consult Note Telephone: [phone]  Fax: [phone]   Date of encounter: 10/21/22 9:47 PM PATIENT NAME: Rick Mcbride. [address] [place] [address]   [phone] (home)  DOB: Dec 22, 1940 MRN: [id number] PRIMARY CARE PROVIDER:   Idelle Crouch, MD,  [address] [place] [address] [phone] Referral: [facility] STR RESPONSIBLE PARTY:    Contact Information     Name Relation Home Work [place] [phone]     [place] Newcomer Daughter [phone]  [phone]      I met face to face with patient and family in facility. Palliative Care was asked to follow this patient by consultation request of  Sparks, Leonie Douglas, MD /[facility] to address advance care planning and complex medical decision making. This is the initial visit.             ASSESSMENT AND PLAN / RECOMMENDATIONS:  Symptom Management/Plan: 1. Advance Care Planning;  ongoing discussions 2. Goals of Care: Goals include to maximize quality of life and symptom management. Our advance care planning conversation included a discussion about:    The value and importance of advance care planning  Exploration of personal, cultural or spiritual beliefs that might influence medical decisions  Exploration of goals of care in the event of a sudden injury or illness  Identification and preparation of a healthcare agent  Review and updating or creation of an advance directive document. 3. Palliative care encounter; Palliative care encounter; Palliative medicine team will continue to support patient, patient's family, and medical team. Visit consisted of counseling and education dealing with the complex and emotionally intense issues of symptom management and palliative care in the setting of serious and potentially life-threatening illness  4. Anorexia with weight loss; ongoing, continue to monitor  weights, discussed nutrition, supplements;   5. SOB secondary to PNA with recent cxr; primary ordered abx. Continue to monitor respiratory status, inhalation therapy, pulmonary toileting. Follow up Palliative Care Visit: Palliative care will continue to follow for complex medical decision making, advance care planning, and clarification of goals. Return 1 to 4 weeks or prn.  I spent 46 minutes providing this consultation. More than 50% of the time in this consultation was spent in counseling and care coordination. PPS: 30% Chief Complaint: Initial palliative consult for complex medical decision making, address goals, manage ongoing symptoms  HISTORY OF PRESENT ILLNESS:  Rick Mcbride. is a 81 y.o. year old male  with multiple medical problems including Lung cancer, primary squamous cell carcinoma of base of tongue, malignant neoplasm of prostate, CKD IV, anemia of chronic disease, CAD s/p CABG, COPD, diastolic heart failure, thoracic aortic aneurysm, HTN, aortic atherosclerosis, h/o pulmonary embolism, acid reflux, arthritis shoulder, lumbar disc disease, cervical nerve root disorder. Rick Mcbride currently resides at [facility] for STR. Rick Mcbride requires assistance with mobility, transfers, turning, positioning, bathing, dressing, toileting uses a urinal. Prior to hospitalization Rick Mcbride was ambulatory able to perform ADL's, PPS at that time 60%, currently 30%. Rick Mcbride requires assistance for feeding, tray set up with decreased appetite with >40 lb weight loss in 6 months. At present Rick Mcbride is lying in bed, appears thin, debilitated, comfortable with daughter, Rick Mcbride and wife at bedside. We talked about past medical history including cancer, treatments. We talked about last time Rick Mcbride was independent, hospitalization, functional abilities prior to hospitalization and currently. We talked about therapy with goal to be  able to ambulate short distances around his home with his  wishes to d/c home with STR completed. We talked about realistic expectations. We talked about his work currently with therapy as he remains bedbound. We talked about home health with d/c home for PT/OT/nursing and CNA. We talked about Rick Mcbride hiring a private caregiver agency to help with home needs. We talked about ros, recent dx of pna, started abx by primary. We talked about ros, appetite, weight loss, nutrition, foods, swallowing. We talked about chronic disease. Medical goals reviewed, poc, medications. We talked about wishes for further exploration of hospice services under medicare benefit once therapy has been completed with focus to minimize suffering. We talked about life review, social and family history. Rick Mcbride has a handicap son residing in facility, bedbound and 1 daughter, Rick Mcbride who is HCPOA with Rick Mcbride. We talked about PC home to follow with d/c from STR to home until transition to hospice. We talked about overall decline in setting of chronic disease progression, acute illness of pna with natural progression of aging. Discussed coping strategies, caregiver fatigue, stress, importance of self care. Discussed role pc in poc. Therapeutic listening, emotional support provided. Questions answered. Updated staff, no new changes to poc.  History obtained from review of EMR, discussion with primary team, and interview with family, facility staff/caregiver and/or Rick. Boule.  I reviewed available labs, medications, imaging, studies and related documents from the EMR.  Records reviewed and summarized above.   ROS 10 point system reviewed all negative except +weight loss, anorexia, generalized weakness, +cough  Physical Exam: Constitutional: NAD General: frail appearing, debilitated male EYES: lids intact ENMT: oral mucous membranes moist CV: S1S2, RRR Pulmonary: +rhonchi, decreased bases Abdomen: normo-active BS + 4 quadrants, soft and non tender MSK: bed-bound Skin: warm and  dry Neuro:  + generalized weakness,  no cognitive impairment Psych: non-anxious affect, A and O x 3 CURRENT PROBLEM LIST:  Patient Active Problem List   Diagnosis Date Noted   SOB (shortness of breath)    Pressure injury of skin 08/26/2022   Mucus plugging of bronchi 08/17/2022   Lumbar degenerative disc disease 08/17/2022   Arthritis of lumbar spine 08/17/2022   Acute pulmonary embolism ([place]) 08/16/2022   Bacteremia due to Enterococcus 08/15/2022   COPD exacerbation ([place]) 08/14/2022   HLD (hyperlipidemia)    TIA (transient ischemic attack)    Iron deficiency anemia    Anxiety    Chronic diastolic CHF (congestive heart failure) ([place]) 06/15/2022   Overweight (BMI 25.0-29.9) 06/10/2022   Presence of permanent cardiac pacemaker    Mild cognitive impairment    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HCC) 08/28/2021   Chronic pain syndrome 06/16/2021   Numbness of right foot 02/19/2021   Aortic atherosclerosis ([place]) 10/15/2020   Status post right shoulder hemiarthroplasty 01/25/2020   Anemia in chronic kidney disease 11/29/2019   Benign hypertensive kidney disease with chronic kidney disease 11/29/2019   Chronic kidney disease, stage IV (severe) ([place]) 11/29/2019   Secondary hyperparathyroidism of renal origin ([place]) 11/29/2019   Chronic right shoulder pain 11/12/2019   Thoracic aortic aneurysm without rupture ([place]) 10/09/2019   Weakness 06/07/2017   Olecranon fracture, left, closed, initial encounter 05/27/2017   Spondylolisthesis of lumbosacral region 03/16/2017   Low back pain 02/01/2017   Escherichia coli (E. coli) infection 01/28/2017   Elevated troponin 01/28/2017   Generalized weakness 01/28/2017   Essential hypertension 01/28/2017   UTI (urinary tract infection) 01/26/2017   Chronic obstructive pulmonary  disease ([place]) 10/09/2015   Personal history of diseases of skin or subcutaneous tissue 10/09/2015   Malignant neoplasm of prostate ([place]) 10/09/2015   Pure  hypercholesterolemia 10/09/2015   Sinoatrial node dysfunction ([place]) 10/09/2015   History of surgical procedure 08/22/2015   Status post total shoulder replacement 07/10/2015   Arthritis of shoulder region, degenerative 06/10/2015   Benign essential HTN 06/03/2015   Essential (primary) hypertension 04/12/2015   Acid reflux 04/12/2015   Cancer of lung ([place]) 04/12/2015   CA of prostate ([place]) 04/12/2015   H/O adenomatous polyp of colon 01/15/2015   Temporary cerebral vascular dysfunction 11/21/2014   Calcific shoulder tendinitis 08/01/2014   Cervical nerve root disorder 04/07/2012   CAD S/P CABG x 3 05/01/2004   PAST MEDICAL HISTORY:  Active Ambulatory Problems    Diagnosis Date Noted   Calcific shoulder tendinitis 08/01/2014   Cervical nerve root disorder 04/07/2012   Essential (primary) hypertension 04/12/2015   Acid reflux 04/12/2015   H/O adenomatous polyp of colon 01/15/2015   Cancer of lung ([place]) 04/12/2015   CA of prostate ([place]) 04/12/2015   Temporary cerebral vascular dysfunction 11/21/2014   Status post total shoulder replacement 07/10/2015   Benign essential HTN 06/03/2015   Chronic obstructive pulmonary disease ([place]) 10/09/2015   Personal history of diseases of skin or subcutaneous tissue 10/09/2015   Arthritis of shoulder region, degenerative 06/10/2015   Malignant neoplasm of prostate ([place]) 10/09/2015   Pure hypercholesterolemia 10/09/2015   Sinoatrial node dysfunction ([place]) 10/09/2015   History of surgical procedure 08/22/2015   UTI (urinary tract infection) 01/26/2017   Escherichia coli (E. coli) infection 01/28/2017   Elevated troponin 01/28/2017   Generalized weakness 01/28/2017   Essential hypertension 01/28/2017   Low back pain 02/01/2017   Spondylolisthesis of lumbosacral region 03/16/2017   Weakness 06/07/2017   Olecranon fracture, left, closed, initial encounter 05/27/2017   Thoracic aortic aneurysm without rupture ([place]) 10/09/2019   Chronic right  shoulder pain 11/12/2019   Anemia in chronic kidney disease 11/29/2019   Aortic atherosclerosis ([place]) 10/15/2020   Benign hypertensive kidney disease with chronic kidney disease 11/29/2019   Chronic kidney disease, stage IV (severe) ([place]) 11/29/2019   Secondary hyperparathyroidism of renal origin ([place]) 11/29/2019   Status post right shoulder hemiarthroplasty 01/25/2020   Numbness of right foot 02/19/2021   Chronic pain syndrome 06/16/2021   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[place]) 08/28/2021   Presence of permanent cardiac pacemaker    CAD S/P CABG x 3 05/01/2004   Mild cognitive impairment    Overweight (BMI 25.0-29.9) 06/10/2022   Chronic diastolic CHF (congestive heart failure) ([place]) 06/15/2022   COPD exacerbation ([place]) 08/14/2022   HLD (hyperlipidemia)    TIA (transient ischemic attack)    Iron deficiency anemia    Anxiety    Bacteremia due to Enterococcus 08/15/2022   Acute pulmonary embolism ([place]) 08/16/2022   Mucus plugging of bronchi 08/17/2022   Lumbar degenerative disc disease 08/17/2022   Arthritis of lumbar spine 08/17/2022   Pressure injury of skin 08/26/2022   SOB (shortness of breath)    Resolved Ambulatory Problems    Diagnosis Date Noted   Absolute anemia 04/12/2015   Atrophic kidney 04/12/2015   AAA (abdominal aortic aneurysm) without rupture ([place]) 11/23/2016   AKI (acute kidney injury) ([place]) 01/28/2017   Hematuria 11/29/2019   Proteinuria 11/29/2019   Rotator cuff tendinitis, right 01/25/2020   Fracture of femoral neck, right, closed ([place]) 06/10/2022   Acute postoperative anemia due to expected  blood loss 09/10/1593   Acute diastolic heart failure ([place]) 06/13/2022   Hiccups 06/14/2022   Aneurysm of right common iliac artery (HCC)    Obesity (BMI 30-39.9)    Back pain    Myocardial injury    Complication of aortic graft 08/16/2022   Past Medical History:  Diagnosis Date   AAA (abdominal aortic aneurysm) (HCC)    Abnormality of tongue     Anemia    B12 deficiency    CAD (coronary artery disease)    Cervical radiculopathy    Chronic airway obstruction (HCC)    Chronic kidney disease (CKD), stage III (moderate) (HCC)    Chronic tension headaches    Chronic, continuous use of opioids 06/16/2021   Coronary artery disease    DDD (degenerative disc disease), lumbar    Degenerative disc disease, lumbar    Elbow fracture, left    GERD (gastroesophageal reflux disease)    H/O hemorrhoids    HTN (hypertension)    Hyperlipidemia    Meralgia paresthetica    Nephrolithiasis    Neuralgia    Osteoarthritis    Pars defect of lumbar spine    Prostate cancer (HCC)    Second degree AV block    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[place]) 01/31/2013   Status post partial lobectomy of lung    Stroke ([place])    Valvular regurgitation    SOCIAL HX:  Social History   Tobacco Use   Smoking status: Former    Packs/day: 1.50    Years: 45.00    Total pack years: 67.50    Types: Cigarettes    Quit date: 04/07/2004    Years since quitting: 18.5   Smokeless tobacco: Never  Substance Use Topics   Alcohol use: No   FAMILY HX:  Family History  Problem Relation Age of Onset   Heart attack Mother    Heart attack Father    Breast cancer Sister    Asthma Sister       ALLERGIES: No Known Allergies   PERTINENT MEDICATIONS:  Outpatient Encounter Medications as of 10/21/2022  Medication Sig   acetaminophen (TYLENOL) 500 MG tablet Take 500 mg by mouth every 4 (four) hours as needed for moderate pain.   amLODipine (NORVASC) 10 MG tablet Take 1 tablet (10 mg total) by mouth daily.   aspirin EC 81 MG tablet Take 81 mg by mouth daily.    atorvastatin (LIPITOR) 20 MG tablet Take 40 mg by mouth at bedtime.   benzonatate (TESSALON) 200 MG capsule Take 1 capsule (200 mg total) by mouth 2 (two) times daily as needed for cough.   calcitRIOL (ROCALTROL) 0.25 MCG capsule Take 0.25 mcg by mouth in the morning.   Calcium Carb-Cholecalciferol  (CALCIUM 600 + D PO) Take 1 tablet by mouth daily after lunch.   cetirizine (ZYRTEC) 10 MG tablet Take 10 mg by mouth at bedtime.   chlorproMAZINE (THORAZINE) 25 MG tablet Take 1 tablet (25 mg total) by mouth 3 (three) times daily as needed (hiccups.).   clopidogrel (PLAVIX) 75 MG tablet Take 1 tablet (75 mg total) by mouth daily. Restart Plavix after completion of Eliquis prescription.   docusate sodium (COLACE) 100 MG capsule Take 300 mg by mouth daily after lunch.   donepezil (ARICEPT) 10 MG tablet Take 10 mg by mouth at bedtime.   ezetimibe (ZETIA) 10 MG tablet Take 10 mg by mouth every evening.   feeding supplement (ENSURE ENLIVE / ENSURE PLUS) LIQD Take 237 mLs  by mouth 2 (two) times daily between meals.   Ferrous Sulfate (IRON SLOW RELEASE) 140 (45 Fe) MG TBCR Take 1 tablet by mouth 2 (two) times daily.   ipratropium (ATROVENT) 0.06 % nasal spray Place 2-4 sprays into both nostrils at bedtime.   lidocaine (LIDODERM) 5 % Place 1 patch onto the skin daily. Remove & Discard patch within 12 hours or as directed by MD   melatonin 5 MG TABS Take 10 mg by mouth at bedtime.   methocarbamol (ROBAXIN) 500 MG tablet Take 1 tablet (500 mg total) by mouth at bedtime as needed for muscle spasms. (Patient taking differently: Take 500 mg by mouth every evening.)   metoprolol succinate (TOPROL-XL) 25 MG 24 hr tablet Take 25 mg by mouth daily.   Multiple Vitamin (MULTIVITAMIN WITH MINERALS) TABS tablet Take 1 tablet by mouth daily after lunch.   Multiple Vitamins-Minerals (PRESERVISION AREDS 2) CAPS Take 1 tablet by mouth 2 (two) times daily.   oxyCODONE-acetaminophen (PERCOCET/ROXICET) 5-325 MG tablet Take 2 tablets by mouth every 6 (six) hours.   pantoprazole (PROTONIX) 40 MG tablet Take 40 mg by mouth every evening.   polyethylene glycol (MIRALAX / GLYCOLAX) 17 g packet Take 17 g by mouth daily as needed for moderate constipation or mild constipation.   Probiotic Product (PROBIOTIC PO) Take 1 capsule by  mouth in the morning.   traMADol (ULTRAM) 50 MG tablet Take 1 tablet (50 mg total) by mouth every 6 (six) hours as needed for moderate pain.   zolpidem (AMBIEN) 10 MG tablet Take 1 tablet (10 mg total) by mouth at bedtime.   No facility-administered encounter medications on file as of 10/21/2022.   Thank you for the opportunity to participate in the care of Rick. Magowan.  The palliative care team will continue to follow. Please call our office at [phone] if we can be of additional assistance.   Yana Schorr Z Lahari Suttles, NP ,

## 2022-10-22 DIAGNOSIS — R7881 Bacteremia: Secondary | ICD-10-CM | POA: Diagnosis not present

## 2022-10-22 DIAGNOSIS — J159 Unspecified bacterial pneumonia: Secondary | ICD-10-CM | POA: Diagnosis not present

## 2022-10-22 DIAGNOSIS — M6281 Muscle weakness (generalized): Secondary | ICD-10-CM | POA: Diagnosis not present

## 2022-10-22 DIAGNOSIS — D509 Iron deficiency anemia, unspecified: Secondary | ICD-10-CM | POA: Diagnosis not present

## 2022-10-26 ENCOUNTER — Ambulatory Visit: Payer: PPO

## 2022-10-26 DIAGNOSIS — I5032 Chronic diastolic (congestive) heart failure: Secondary | ICD-10-CM | POA: Diagnosis not present

## 2022-10-26 DIAGNOSIS — S72001D Fracture of unspecified part of neck of right femur, subsequent encounter for closed fracture with routine healing: Secondary | ICD-10-CM | POA: Diagnosis not present

## 2022-10-26 DIAGNOSIS — N184 Chronic kidney disease, stage 4 (severe): Secondary | ICD-10-CM | POA: Diagnosis not present

## 2022-10-26 DIAGNOSIS — M4317 Spondylolisthesis, lumbosacral region: Secondary | ICD-10-CM | POA: Diagnosis not present

## 2022-10-26 DIAGNOSIS — R2681 Unsteadiness on feet: Secondary | ICD-10-CM | POA: Diagnosis not present

## 2022-10-26 DIAGNOSIS — D509 Iron deficiency anemia, unspecified: Secondary | ICD-10-CM | POA: Diagnosis not present

## 2022-10-26 DIAGNOSIS — I130 Hypertensive heart and chronic kidney disease with heart failure and stage 1 through stage 4 chronic kidney disease, or unspecified chronic kidney disease: Secondary | ICD-10-CM | POA: Diagnosis not present

## 2022-10-26 DIAGNOSIS — M6281 Muscle weakness (generalized): Secondary | ICD-10-CM | POA: Diagnosis not present

## 2022-10-26 DIAGNOSIS — I712 Thoracic aortic aneurysm, without rupture, unspecified: Secondary | ICD-10-CM | POA: Diagnosis not present

## 2022-10-26 DIAGNOSIS — J449 Chronic obstructive pulmonary disease, unspecified: Secondary | ICD-10-CM | POA: Diagnosis not present

## 2022-10-27 ENCOUNTER — Ambulatory Visit: Payer: PPO | Admitting: Urology

## 2022-10-28 ENCOUNTER — Ambulatory Visit: Payer: PPO | Admitting: Radiation Oncology

## 2022-10-28 DIAGNOSIS — M5137 Other intervertebral disc degeneration, lumbosacral region: Secondary | ICD-10-CM | POA: Diagnosis not present

## 2022-10-28 DIAGNOSIS — Z09 Encounter for follow-up examination after completed treatment for conditions other than malignant neoplasm: Secondary | ICD-10-CM | POA: Diagnosis not present

## 2022-10-28 DIAGNOSIS — N184 Chronic kidney disease, stage 4 (severe): Secondary | ICD-10-CM | POA: Diagnosis not present

## 2022-10-28 DIAGNOSIS — D649 Anemia, unspecified: Secondary | ICD-10-CM | POA: Diagnosis not present

## 2022-10-28 DIAGNOSIS — I5032 Chronic diastolic (congestive) heart failure: Secondary | ICD-10-CM | POA: Diagnosis not present

## 2022-10-28 DIAGNOSIS — I10 Essential (primary) hypertension: Secondary | ICD-10-CM | POA: Diagnosis not present

## 2022-10-29 ENCOUNTER — Telehealth: Payer: Self-pay

## 2022-10-29 ENCOUNTER — Other Ambulatory Visit (INDEPENDENT_AMBULATORY_CARE_PROVIDER_SITE_OTHER): Payer: PPO

## 2022-10-29 ENCOUNTER — Ambulatory Visit (INDEPENDENT_AMBULATORY_CARE_PROVIDER_SITE_OTHER): Payer: PPO | Admitting: Vascular Surgery

## 2022-10-29 NOTE — Telephone Encounter (Signed)
421 pm.  Return call made to daughter Sharyn Lull.  They are needing a hospital bed as patient remains weak.  Patient seen today by Mortimer Fries at [facility].  Advised I would contact PCP office to have them order needed DME.  Phone call made to PCP office and message left with need.

## 2022-10-29 NOTE — Telephone Encounter (Signed)
TC made to patients daughter, Sharyn Lull, to schedule in home palliative car visit for 1/4 @10am .  Call unsuccessful. SW LVM. Awaiting return call.

## 2022-11-09 ENCOUNTER — Other Ambulatory Visit: Payer: Self-pay | Admitting: Infectious Diseases

## 2022-11-09 ENCOUNTER — Telehealth: Payer: Self-pay | Admitting: Infectious Diseases

## 2022-11-09 MED ORDER — AMOXICILLIN 500 MG PO TABS: 500.0000 mg | ORAL_TABLET | Freq: Two times a day (BID) | ORAL | 1 refills | Status: DC

## 2022-11-09 NOTE — Telephone Encounter (Signed)
Spoke to wife- pt is back home from SNF- has completed nearly 12 weeks of antibiotic- Does not need any more. She will get back to me PRN

## 2022-11-09 NOTE — Progress Notes (Signed)
Spoke to the wife to clarify continuing antibiotics On further reviewing his chart , decided to continue antibiotics Amoxicillin 61m Po BID  ( not TID) because of the following He had enterococcus bacteremia in Oct 2023  with severe lumbar pain and there was concern for lumbar epidural abscess, and as he had hardware in that area we could not get MRI- also pacemaker in place- His last ESR was 120 and Crp 22- need new labs Will cotinue amoxicillin for now

## 2022-11-10 NOTE — Progress Notes (Signed)
Verbal orders given to Kathlee Nations with [facility] to draw labs at the patient's home tomorrow when the nurse sees the patient. Verbal orders to draw CRP and ESR per Dr. Delaine Lame. Chauntae Hults T Brooks Sailors

## 2022-11-11 ENCOUNTER — Other Ambulatory Visit: Payer: PPO

## 2022-11-11 VITALS — BP 116/62 | HR 68 | Temp 97.7°F

## 2022-11-11 DIAGNOSIS — J449 Chronic obstructive pulmonary disease, unspecified: Secondary | ICD-10-CM | POA: Diagnosis not present

## 2022-11-11 DIAGNOSIS — N184 Chronic kidney disease, stage 4 (severe): Secondary | ICD-10-CM | POA: Diagnosis not present

## 2022-11-11 DIAGNOSIS — D509 Iron deficiency anemia, unspecified: Secondary | ICD-10-CM | POA: Diagnosis not present

## 2022-11-11 DIAGNOSIS — R6520 Severe sepsis without septic shock: Secondary | ICD-10-CM | POA: Diagnosis not present

## 2022-11-11 DIAGNOSIS — R7881 Bacteremia: Secondary | ICD-10-CM | POA: Diagnosis not present

## 2022-11-11 DIAGNOSIS — M6281 Muscle weakness (generalized): Secondary | ICD-10-CM | POA: Diagnosis not present

## 2022-11-11 DIAGNOSIS — M4317 Spondylolisthesis, lumbosacral region: Secondary | ICD-10-CM | POA: Diagnosis not present

## 2022-11-11 DIAGNOSIS — I130 Hypertensive heart and chronic kidney disease with heart failure and stage 1 through stage 4 chronic kidney disease, or unspecified chronic kidney disease: Secondary | ICD-10-CM | POA: Diagnosis not present

## 2022-11-11 DIAGNOSIS — S72001D Fracture of unspecified part of neck of right femur, subsequent encounter for closed fracture with routine healing: Secondary | ICD-10-CM | POA: Diagnosis not present

## 2022-11-11 DIAGNOSIS — I5032 Chronic diastolic (congestive) heart failure: Secondary | ICD-10-CM | POA: Diagnosis not present

## 2022-11-11 DIAGNOSIS — Z515 Encounter for palliative care: Secondary | ICD-10-CM

## 2022-11-11 DIAGNOSIS — A419 Sepsis, unspecified organism: Secondary | ICD-10-CM | POA: Diagnosis not present

## 2022-11-11 DIAGNOSIS — I712 Thoracic aortic aneurysm, without rupture, unspecified: Secondary | ICD-10-CM | POA: Diagnosis not present

## 2022-11-11 DIAGNOSIS — R2681 Unsteadiness on feet: Secondary | ICD-10-CM | POA: Diagnosis not present

## 2022-11-11 NOTE — Progress Notes (Signed)
PATIENT NAME: Rick Mcbride. DOB: Mar 12, 1941 MRN: [id number]  PRIMARY CARE PROVIDER: Idelle Crouch, MD  RESPONSIBLE PARTY:  Acct ID - Guarantor Home Phone Work Phone Relationship Acct Type  [id number] WILEY, [phone]  Self P/F     [place], [place], [place] [address]    Anorexia:  Improvement reported in appetite since returning home.  Eating 3 meals a day.   Also consuming a supplement drink at least daily.   Wife and patient do not feel there has been any more weight loss since returning home.  Right mid-arm circumference 28 cm today.  Constipation:  Better managed at home.  Now having a bowel movement daily.  No issues with constipation reported by wife.  Continues on Miralax am and Senna (1 tab) at hs.   Hospice vs Palliative Care:  Discussed differences between the programs.  Spoke with patient's wife separately and she advised she was not sure patient would be able to progress with [facility].  She is very realistic about patient's condition and aware hospice may need to be ordered soon.  Wife advised they wanted to give patient every opportunity to improve but if he could not they are open to a hospice referral.  Respiratory: scattered rhonchi present.  No coughing or sputum production reported by family or patient.  Denies shortness of breath.  Weakness:  Patient has not started home health therapy since his discharge home from [facility].  He is scheduled to be seen this week by PT.  Patient remains bed-bound requiring extensive assistance with bed-mobility.  Due to this, family has requested a hospital bed.  I contacted PCP office last month and this request was sent through the [place] for [facility].  [facility] is contacted on this visit but they have not received the ordered.  PCP office contacted Olivia Mackie) and requested order be resent.    Phone call made to patient's daughter Sharyn Lull with update on today's visit.    CODE STATUS: DNR-form in the  home. ADVANCED DIRECTIVES: Yes MOST FORM: No PPS: 30%   PHYSICAL EXAM:   VITALS: Today's Vitals   11/11/22 1029  BP: 116/62  Pulse: 68  Temp: 97.7 F (36.5 C)  SpO2: 97%    LUNGS: scattered rhonchi bilaterally CARDIAC: Cor RRR}  EXTREMITIES: - for edema SKIN: Skin color, texture, turgor normal. No rashes or lesions or mobility and turgor normal  NEURO: positive for gait problems and weakness       Lorenza Burton, RN

## 2022-11-16 ENCOUNTER — Other Ambulatory Visit (INDEPENDENT_AMBULATORY_CARE_PROVIDER_SITE_OTHER): Payer: PPO

## 2022-11-16 ENCOUNTER — Ambulatory Visit (INDEPENDENT_AMBULATORY_CARE_PROVIDER_SITE_OTHER): Payer: PPO | Admitting: Vascular Surgery

## 2022-11-22 DIAGNOSIS — M6281 Muscle weakness (generalized): Secondary | ICD-10-CM | POA: Diagnosis not present

## 2022-11-22 DIAGNOSIS — I130 Hypertensive heart and chronic kidney disease with heart failure and stage 1 through stage 4 chronic kidney disease, or unspecified chronic kidney disease: Secondary | ICD-10-CM | POA: Diagnosis not present

## 2022-11-22 DIAGNOSIS — I712 Thoracic aortic aneurysm, without rupture, unspecified: Secondary | ICD-10-CM | POA: Diagnosis not present

## 2022-11-22 DIAGNOSIS — N184 Chronic kidney disease, stage 4 (severe): Secondary | ICD-10-CM | POA: Diagnosis not present

## 2022-11-22 DIAGNOSIS — R2681 Unsteadiness on feet: Secondary | ICD-10-CM | POA: Diagnosis not present

## 2022-11-22 DIAGNOSIS — I5032 Chronic diastolic (congestive) heart failure: Secondary | ICD-10-CM | POA: Diagnosis not present

## 2022-11-22 DIAGNOSIS — S72001D Fracture of unspecified part of neck of right femur, subsequent encounter for closed fracture with routine healing: Secondary | ICD-10-CM | POA: Diagnosis not present

## 2022-11-25 ENCOUNTER — Other Ambulatory Visit: Payer: PPO

## 2022-11-25 VITALS — BP 112/64 | HR 66 | Temp 97.5°F

## 2022-11-25 DIAGNOSIS — Z515 Encounter for palliative care: Secondary | ICD-10-CM

## 2022-11-25 NOTE — Progress Notes (Signed)
PATIENT NAME: Rick Mcbride. DOB: 11/30/1940 MRN: [id number]  PRIMARY CARE PROVIDER: Marguarite Arbour, MD  RESPONSIBLE PARTY:  Acct ID - Guarantor Home Phone Work Phone Relationship Acct Type  [id number] [name], [name] [phone]  Self P/F     [address], [place], [place] [address]   Home visit completed with patient, wife and caregiver Rick Mcbride.  Constipation:  Now resolved.  Continues miralax and senna s.  Last bowel movement this am.   Home Health:  Continues with PT/OT 1-2x weekly.  Mobility:  Patient was able to ambulate with a walker in the home a couple of times this week.  Patient is now able to sit on the edge of the bed with assistance. Family is working with patient outside of therapy.   Private aide has been very supportive with both patient and wife.  They are using the walker with a wheelchair behind him.  No falls reported.   Incontinence:  Remains incontinent of bowel and bladder.  Denies any s/s of UTI.   Pain:  ongoing back pain. Being treated with tramadol at least 2 x daily.   Home visit scheduled for next month.      CODE STATUS: DNR in the home.  ADVANCED DIRECTIVES: Yes MOST FORM: No PPS: 40%   PHYSICAL EXAM:   VITALS: Today's Vitals   11/25/22 1021  BP: 112/64  Pulse: 66  Temp: (!) 97.5 F (36.4 C)  SpO2: 98%    LUNGS:  rhonchi present to left upper lobe-using flutter valve and spirometer CARDIAC: Cor RRR}  EXTREMITIES: - for edema SKIN: Skin color, texture, turgor normal. No rashes or lesions or mobility and turgor normal  NEURO: positive for gait problems and memory problems       Truitt Merle, RN

## 2022-12-10 DIAGNOSIS — Z0000 Encounter for general adult medical examination without abnormal findings: Secondary | ICD-10-CM | POA: Diagnosis not present

## 2022-12-10 DIAGNOSIS — N390 Urinary tract infection, site not specified: Secondary | ICD-10-CM | POA: Diagnosis not present

## 2022-12-10 DIAGNOSIS — N183 Chronic kidney disease, stage 3 unspecified: Secondary | ICD-10-CM | POA: Diagnosis not present

## 2022-12-10 DIAGNOSIS — I712 Thoracic aortic aneurysm, without rupture, unspecified: Secondary | ICD-10-CM | POA: Diagnosis not present

## 2022-12-10 DIAGNOSIS — I130 Hypertensive heart and chronic kidney disease with heart failure and stage 1 through stage 4 chronic kidney disease, or unspecified chronic kidney disease: Secondary | ICD-10-CM | POA: Diagnosis not present

## 2022-12-10 DIAGNOSIS — R7303 Prediabetes: Secondary | ICD-10-CM | POA: Diagnosis not present

## 2022-12-10 DIAGNOSIS — D509 Iron deficiency anemia, unspecified: Secondary | ICD-10-CM | POA: Diagnosis not present

## 2022-12-10 DIAGNOSIS — I5032 Chronic diastolic (congestive) heart failure: Secondary | ICD-10-CM | POA: Diagnosis not present

## 2022-12-10 DIAGNOSIS — I25710 Atherosclerosis of autologous vein coronary artery bypass graft(s) with unstable angina pectoris: Secondary | ICD-10-CM | POA: Diagnosis not present

## 2022-12-10 DIAGNOSIS — R739 Hyperglycemia, unspecified: Secondary | ICD-10-CM | POA: Diagnosis not present

## 2022-12-10 DIAGNOSIS — J449 Chronic obstructive pulmonary disease, unspecified: Secondary | ICD-10-CM | POA: Diagnosis not present

## 2022-12-10 DIAGNOSIS — Z79899 Other long term (current) drug therapy: Secondary | ICD-10-CM | POA: Diagnosis not present

## 2022-12-10 DIAGNOSIS — N2581 Secondary hyperparathyroidism of renal origin: Secondary | ICD-10-CM | POA: Diagnosis not present

## 2022-12-10 DIAGNOSIS — M6281 Muscle weakness (generalized): Secondary | ICD-10-CM | POA: Diagnosis not present

## 2022-12-10 DIAGNOSIS — R8290 Unspecified abnormal findings in urine: Secondary | ICD-10-CM | POA: Diagnosis not present

## 2022-12-10 DIAGNOSIS — S72001D Fracture of unspecified part of neck of right femur, subsequent encounter for closed fracture with routine healing: Secondary | ICD-10-CM | POA: Diagnosis not present

## 2022-12-10 DIAGNOSIS — E782 Mixed hyperlipidemia: Secondary | ICD-10-CM | POA: Diagnosis not present

## 2022-12-10 DIAGNOSIS — I10 Essential (primary) hypertension: Secondary | ICD-10-CM | POA: Diagnosis not present

## 2022-12-10 DIAGNOSIS — N184 Chronic kidney disease, stage 4 (severe): Secondary | ICD-10-CM | POA: Diagnosis not present

## 2022-12-10 DIAGNOSIS — R2681 Unsteadiness on feet: Secondary | ICD-10-CM | POA: Diagnosis not present

## 2022-12-13 DIAGNOSIS — I5032 Chronic diastolic (congestive) heart failure: Secondary | ICD-10-CM | POA: Diagnosis not present

## 2022-12-13 DIAGNOSIS — N2581 Secondary hyperparathyroidism of renal origin: Secondary | ICD-10-CM | POA: Diagnosis not present

## 2022-12-13 DIAGNOSIS — N184 Chronic kidney disease, stage 4 (severe): Secondary | ICD-10-CM | POA: Diagnosis not present

## 2022-12-13 DIAGNOSIS — R8290 Unspecified abnormal findings in urine: Secondary | ICD-10-CM | POA: Diagnosis not present

## 2022-12-13 DIAGNOSIS — R739 Hyperglycemia, unspecified: Secondary | ICD-10-CM | POA: Diagnosis not present

## 2022-12-13 DIAGNOSIS — I10 Essential (primary) hypertension: Secondary | ICD-10-CM | POA: Diagnosis not present

## 2022-12-13 DIAGNOSIS — N183 Chronic kidney disease, stage 3 unspecified: Secondary | ICD-10-CM | POA: Diagnosis not present

## 2022-12-13 DIAGNOSIS — R7303 Prediabetes: Secondary | ICD-10-CM | POA: Diagnosis not present

## 2022-12-13 DIAGNOSIS — Z79899 Other long term (current) drug therapy: Secondary | ICD-10-CM | POA: Diagnosis not present

## 2022-12-13 DIAGNOSIS — I25710 Atherosclerosis of autologous vein coronary artery bypass graft(s) with unstable angina pectoris: Secondary | ICD-10-CM | POA: Diagnosis not present

## 2022-12-13 DIAGNOSIS — Z0000 Encounter for general adult medical examination without abnormal findings: Secondary | ICD-10-CM | POA: Diagnosis not present

## 2022-12-13 DIAGNOSIS — E782 Mixed hyperlipidemia: Secondary | ICD-10-CM | POA: Diagnosis not present

## 2022-12-14 DIAGNOSIS — R8290 Unspecified abnormal findings in urine: Secondary | ICD-10-CM | POA: Diagnosis not present

## 2022-12-20 ENCOUNTER — Other Ambulatory Visit: Payer: PPO

## 2022-12-20 ENCOUNTER — Telehealth: Payer: Self-pay | Admitting: *Deleted

## 2022-12-20 VITALS — BP 128/70 | HR 56 | Temp 97.5°F

## 2022-12-20 DIAGNOSIS — Z515 Encounter for palliative care: Secondary | ICD-10-CM

## 2022-12-20 NOTE — Progress Notes (Unsigned)
PATIENT NAME: Rick Mcbride. DOB: 1941/10/26 MRN: [id number]  PRIMARY CARE PROVIDER: Idelle Crouch, MD  RESPONSIBLE PARTY:  Acct ID - Guarantor Home Phone Work Phone Relationship Acct Type  [id number] Rick Mcbride* [phone]  Self P/F     [place], [place], [place] [address]  Home visit completed with patient and wife.  Connected with daughter Rick Mcbride by phone.   Hospice vs Palliative Care:  Shortness of breath:    HISTORY OF PRESENT ILLNESS:    CODE STATUS:   Code Status: Prior  ADVANCED DIRECTIVES: N MOST FORM: {Responses; yes/no} PPS: {NUMBERS 0%-100%:21292}   PHYSICAL EXAM:   VITALS: Today's Vitals   12/20/22 1318  BP: 128/70  Pulse: (!) 56  Temp: (!) 97.5 F (36.4 C)  SpO2: 95%    LUNGS: {SYSTEM LUNGS ADULT/PED EXAM:21906} CARDIAC: {Mis exam cardio:32073}} *** EXTREMITIES: {Exam; extremity:10330} SKIN: {Findings; skin exam-one line:31329::"Skin color, texture, turgor normal. No rashes or lesions"}  NEURO: {Findings; ROS neuro:30532::"negative"}       Rick Burton, RN

## 2022-12-20 NOTE — Patient Outreach (Signed)
  Care Coordination   12/20/2022 Name: Rick Mcbride. MRN: [id number] DOB: 1941-06-25   Care Coordination Outreach Attempts:  An unsuccessful telephone outreach was attempted today to offer the patient information about available care coordination services as a benefit of their health plan.   Follow Up Plan:  Additional outreach attempts will be made to offer the patient care coordination information and services.   Encounter Outcome:  No Answer   Care Coordination Interventions:  No, not indicated    Valente David, RN, MSN, [facility] Care Management Care Management Coordinator [phone]

## 2022-12-23 ENCOUNTER — Telehealth: Payer: Self-pay

## 2022-12-23 NOTE — Telephone Encounter (Signed)
Palliative care outreach to patients daughter, Sharyn Lull, per [facility] RN, Malachy Chamber, request to discuss MCD and VA benefits.  Call unsuccessful. SW LVM.

## 2022-12-23 NOTE — Telephone Encounter (Signed)
INCOMING CALL: patients daughter, Sharyn Lull, returned SW call to discuss resources for patient.   In home visit scheduled for mon at 1130am.

## 2022-12-27 ENCOUNTER — Other Ambulatory Visit: Payer: PPO

## 2022-12-27 DIAGNOSIS — R2681 Unsteadiness on feet: Secondary | ICD-10-CM | POA: Diagnosis not present

## 2022-12-27 DIAGNOSIS — I5032 Chronic diastolic (congestive) heart failure: Secondary | ICD-10-CM | POA: Diagnosis not present

## 2022-12-27 DIAGNOSIS — N184 Chronic kidney disease, stage 4 (severe): Secondary | ICD-10-CM | POA: Diagnosis not present

## 2022-12-27 DIAGNOSIS — M6281 Muscle weakness (generalized): Secondary | ICD-10-CM | POA: Diagnosis not present

## 2022-12-27 DIAGNOSIS — I130 Hypertensive heart and chronic kidney disease with heart failure and stage 1 through stage 4 chronic kidney disease, or unspecified chronic kidney disease: Secondary | ICD-10-CM | POA: Diagnosis not present

## 2022-12-27 DIAGNOSIS — C61 Malignant neoplasm of prostate: Secondary | ICD-10-CM | POA: Diagnosis not present

## 2022-12-27 DIAGNOSIS — S72001D Fracture of unspecified part of neck of right femur, subsequent encounter for closed fracture with routine healing: Secondary | ICD-10-CM | POA: Diagnosis not present

## 2022-12-27 DIAGNOSIS — Z515 Encounter for palliative care: Secondary | ICD-10-CM

## 2022-12-27 DIAGNOSIS — N390 Urinary tract infection, site not specified: Secondary | ICD-10-CM | POA: Diagnosis not present

## 2022-12-27 NOTE — Progress Notes (Signed)
COMMUNITY PALLIATIVE CARE SW NOTE  PATIENT NAME: Rick Mcbride. DOB: 03/21/1941 MRN: [id number]  PRIMARY CARE PROVIDER: Idelle Crouch, MD  RESPONSIBLE PARTY:  Acct ID - Guarantor Home Phone Work Phone Relationship Acct Type  [id number] BOYCE, [id number] [phone]  Self P/F     [place], [place], [place] [id number]     PLAN OF CARE and INTERVENTIONS:       Encounter: SW completed follow up PC visit with patient, spouse, daughter and SIL. To discuss community support and resources. Patient and family was received at kitchen table, patient was sitting in [place]. Patient actively engaged in visit and conversation. The following resources/support were discussed. Patient currently has a private caregiver with [facility] Crow, Q AM Mon-Sat.   MCD: Patient does not qualify for community MCD. However patient and spouse could qualify for SNF or ALF MCD when family is ready to discuss placement in more detail. Placement, such as SNF vs CCRC's discussed briefly, family to discuss more amongst themselves.   [facility]: Patient had a referral into [facility] prior to SNF placement, however patient did not meet the physical requirements, as he was non ambulatory. At this time patient is more mobile and family is interested in [facility] program again. Daughter has outreached [facility] intake coordinator, Kathreen Cornfield, with no response yet. SW called [facility] to inquire with Butch Penny on patients previous referral - donna is currently out of the office unitl 12/28/22 and Lauris Chroman is covering for her, SW LVM with [facility].  VA Benefits: Patient served in the [employer] during [place] era. SW provided daughter with [facility], Kirke Shaggy [phone], contact information to assist with guidance on VA benefits.  [facility]: currently patient is receiving HH PT and OT. PCP place order for RN and PCA. SW outreached [facility] to f/u on additional orders and was told that [facility] does not offer CNA/PCA services. SW made family  aware.   Hospice: SW reviewed hospice services and eligibility with patient and family. Patient stated understanding of services and states that he is not ready for hospice services at this time but is not opposed to the services when the time comes that he needs it.   Additional support: At this time patient and family state that they could benefit from additional in home support, but do not have the funds to do so. Patient also is in need of assistance with incontinence supplies, I.e briefs, chucks and barrier cream. SW provided family with the following resources and will check PC office for incontinence supplies donations.  [facility]  The [facility] Program is an application-based program that reimburses eligible family caregivers caring for individuals of any age for up to $500 in respite care services in a calendar year. Funding is limited, and applications are accepted only when funds are available.  Application/Apply: [email or web address]     Family caregiver Respite Program  - also inquire about incontinent supplies. offers 15-20 hours for 90 days.   Call Tammy with [facility] [phone]**  [place] manager - [phone] **currently authorizing hours**  [address] [place], [place]      During this visit SW provide active and reflective listening in the form of reciprocity emotional response. Questions and concerns were addressed. The patient/family was encouraged to call with any additional questions and/or concerns. PC Provided general support and encouragement, no other unmet needs identified.       SOCIAL HX:  Social History  Tobacco Use   Smoking status: Former    Packs/day: 1.50    Years: 45.00    Total pack years: 67.50    Types: Cigarettes    Quit date: 04/07/2004    Years since quitting: 18.7   Smokeless tobacco: Never  Substance Use Topics   Alcohol use:  No    CODE STATUS: Full code  ADVANCED DIRECTIVES: Y MOST FORM COMPLETE:  N HOSPICE EDUCATION PROVIDED: Y  PPS: Patient is MIN-MODA with ADL's and transfers.    Time spent: 50 min   [place], [place]

## 2022-12-28 DIAGNOSIS — C61 Malignant neoplasm of prostate: Secondary | ICD-10-CM | POA: Diagnosis not present

## 2022-12-29 DIAGNOSIS — C61 Malignant neoplasm of prostate: Secondary | ICD-10-CM | POA: Diagnosis not present

## 2022-12-31 DIAGNOSIS — C61 Malignant neoplasm of prostate: Secondary | ICD-10-CM | POA: Diagnosis not present

## 2023-01-03 DIAGNOSIS — C61 Malignant neoplasm of prostate: Secondary | ICD-10-CM | POA: Diagnosis not present

## 2023-01-04 DIAGNOSIS — C61 Malignant neoplasm of prostate: Secondary | ICD-10-CM | POA: Diagnosis not present

## 2023-01-06 ENCOUNTER — Encounter: Payer: Self-pay | Admitting: Infectious Diseases

## 2023-01-06 ENCOUNTER — Ambulatory Visit: Payer: PPO | Attending: Infectious Diseases | Admitting: Infectious Diseases

## 2023-01-06 ENCOUNTER — Other Ambulatory Visit
Admission: RE | Admit: 2023-01-06 | Discharge: 2023-01-06 | Disposition: A | Discharge status: Home / Self Care | Payer: PPO | Source: Ambulatory Visit | Attending: Infectious Diseases | Admitting: Infectious Diseases

## 2023-01-06 VITALS — BP 134/66 | HR 59 | Temp 97.5°F | Wt 181.0 lb

## 2023-01-06 DIAGNOSIS — I129 Hypertensive chronic kidney disease with stage 1 through stage 4 chronic kidney disease, or unspecified chronic kidney disease: Secondary | ICD-10-CM | POA: Diagnosis not present

## 2023-01-06 DIAGNOSIS — Z96641 Presence of right artificial hip joint: Secondary | ICD-10-CM | POA: Diagnosis not present

## 2023-01-06 DIAGNOSIS — G894 Chronic pain syndrome: Secondary | ICD-10-CM | POA: Diagnosis not present

## 2023-01-06 DIAGNOSIS — B952 Enterococcus as the cause of diseases classified elsewhere: Secondary | ICD-10-CM | POA: Diagnosis not present

## 2023-01-06 DIAGNOSIS — Z9079 Acquired absence of other genital organ(s): Secondary | ICD-10-CM | POA: Insufficient documentation

## 2023-01-06 DIAGNOSIS — R7881 Bacteremia: Secondary | ICD-10-CM | POA: Diagnosis not present

## 2023-01-06 DIAGNOSIS — D649 Anemia, unspecified: Secondary | ICD-10-CM | POA: Insufficient documentation

## 2023-01-06 DIAGNOSIS — C029 Malignant neoplasm of tongue, unspecified: Secondary | ICD-10-CM | POA: Insufficient documentation

## 2023-01-06 DIAGNOSIS — G3184 Mild cognitive impairment, so stated: Secondary | ICD-10-CM | POA: Diagnosis not present

## 2023-01-06 DIAGNOSIS — E785 Hyperlipidemia, unspecified: Secondary | ICD-10-CM | POA: Diagnosis not present

## 2023-01-06 DIAGNOSIS — Z923 Personal history of irradiation: Secondary | ICD-10-CM | POA: Insufficient documentation

## 2023-01-06 DIAGNOSIS — Z951 Presence of aortocoronary bypass graft: Secondary | ICD-10-CM | POA: Diagnosis not present

## 2023-01-06 DIAGNOSIS — G062 Extradural and subdural abscess, unspecified: Secondary | ICD-10-CM | POA: Insufficient documentation

## 2023-01-06 DIAGNOSIS — Z950 Presence of cardiac pacemaker: Secondary | ICD-10-CM | POA: Insufficient documentation

## 2023-01-06 DIAGNOSIS — N183 Chronic kidney disease, stage 3 unspecified: Secondary | ICD-10-CM | POA: Insufficient documentation

## 2023-01-06 DIAGNOSIS — Z8546 Personal history of malignant neoplasm of prostate: Secondary | ICD-10-CM | POA: Diagnosis not present

## 2023-01-06 DIAGNOSIS — Z8679 Personal history of other diseases of the circulatory system: Secondary | ICD-10-CM | POA: Diagnosis not present

## 2023-01-06 DIAGNOSIS — I2510 Atherosclerotic heart disease of native coronary artery without angina pectoris: Secondary | ICD-10-CM | POA: Diagnosis not present

## 2023-01-06 LAB — C-REACTIVE PROTEIN: CRP: 1.3 mg/dL — ABNORMAL HIGH (ref <1.0)

## 2023-01-06 LAB — SEDIMENTATION RATE: Sed Rate: 63 mm/hr — ABNORMAL HIGH (ref 0–20)

## 2023-01-06 MED ORDER — AMOXICILLIN 500 MG PO TABS: 500.0000 mg | ORAL_TABLET | Freq: Two times a day (BID) | ORAL | 12 refills | Status: DC

## 2023-01-06 NOTE — Progress Notes (Signed)
NAME: Rick Mcbride.  DOB: 1941/11/03  MRN: [id number]  Date/Time: 01/06/2023 9:32 AM   Subjective:   ?pt here with his wife for follow up for enterococcus bacteremia and L5-s1 fusion infection , epidural abscess, pacemaker [name]. is a 82 y.o. male HT, HLD, rt LL SCC s/p lobectomy, AAA repair , CAD s/p CABG, S/P pacemaker for SA node disease, MCI, L5-S1 fusion, chronic LBP, CKD, RT hip hemiarthroplasty , ca tongue s/p radiation Jan 2023  Was recently in [facility] and [facility] Follow up after hospitalization 08/14/22-09/01/22- during that visit he had enterococcus bacteremia, uti due to enterococcus, AAA with some endoleak for which he got coiling of lumbar arteries during that admission, back pain there was concern for epidural abscess, osteo at L5-s1 fusion site. MRI could not be done due to hardware. Has a pacemaker for SA node and TEE was negative for endocarditis' He got 17 days of IV ampicillin and then switched to PO amoxicillin 1 gram TID. He is currently on amoxicillin -      PMH Pt has been declining since Aug. HE fell on 06/09/22 and came to the ED and noted to have rt femoral fracture- on 06/10/22 he underwent  rt hip bipolar hemiarthroplasty for displaced femoral neck fracture by Dr.Poggi- [facility] and stayed there till 07/23/22   Past Medical History:  Diagnosis Date   AAA (abdominal aortic aneurysm) ([place])    a.) s/p EVAR 01/05/2017. b.) native aneurysm sac 6.6 x 6.9 cm by CT on 03/31/2021   Abnormality of tongue    a.) CT head/neck 07/10/2021 --> asymmetric soft tissue at the RIGHT tongue base with a superficial 8 mm lesion.   Anemia    Aneurysm of right common iliac artery (HCC)    a.) measured 2.7 cm by CT on 03/31/2021   Anxiety    Aortic atherosclerosis (HCC)    Atrophic kidney    B12 deficiency    CAD (coronary artery disease)    Cervical radiculopathy    Chronic airway obstruction (HCC)    Chronic kidney disease  (CKD), stage III (moderate) (HCC)    Chronic pain syndrome 06/16/2021   Chronic right shoulder pain 11/12/2019   Chronic tension headaches    Chronic, continuous use of opioids 06/16/2021   Coronary artery disease    DDD (degenerative disc disease), lumbar    Degenerative disc disease, lumbar    with lumbar radiculopathy   Elbow fracture, left    GERD (gastroesophageal reflux disease)    H/O adenomatous polyp of colon    H/O hemorrhoids    HTN (hypertension)    Hyperlipidemia    Meralgia paresthetica    Mild cognitive impairment    Nephrolithiasis    Neuralgia    Numbness of right foot 02/19/2021   Osteoarthritis    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   Presence of permanent cardiac pacemaker    Prostate cancer ([place])    a.) s/p prostatectomy   S/P CABG x 3 05/01/2004   a.) LVEF 40-49%; LIMA-LAD, SVG-OM1, SVG-PDA   Second degree AV block    Sinoatrial node dysfunction (HCC)    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    Past Surgical History:  Procedure Laterality Date   AORTIC INTERVENTION N/A 08/16/2022   Procedure: AORTIC INTERVENTION Type II endoleak;  Surgeon: Algernon Huxley, MD;  Location: [place] CV LAB;  Service: Cardiovascular;  Laterality: N/A;   CATARACT EXTRACTION Bilateral    COLONOSCOPY     COLONOSCOPY     COLONOSCOPY WITH PROPOFOL N/A 10/20/2015   Procedure: COLONOSCOPY WITH PROPOFOL;  Surgeon: Manya Silvas, MD;  [facility];  Service: Endoscopy;  Laterality: N/A;   COLONOSCOPY WITH PROPOFOL N/A 11/28/2020   Procedure: COLONOSCOPY WITH PROPOFOL;  Surgeon: Robert Bellow, MD;  Location: [facility] ENDOSCOPY;  Service:  Endoscopy;  Laterality: N/A;   CORONARY ARTERY BYPASS GRAFT N/A 05/01/2004   Procedure: 3v CABG (LIMA-LAD, SVG-OM1, SVG-PDA); Location: [facility]; Surgeon: Ander Gaster, MD   EMBOLIZATION Right 12/27/2016   Procedure: Embolization;  Surgeon: Algernon Huxley, MD;  Location: [place] CV LAB;  Service: Cardiovascular;  Laterality: Right;   ENDOVASCULAR REPAIR/STENT GRAFT N/A 01/05/2017   Procedure: Endovascular Repair/Stent Graft;  Surgeon: Algernon Huxley, MD;  Location: [place];  Service: Cardiovascular;  Laterality: N/A;   HIP ARTHROPLASTY Right 06/10/2022   Procedure: ARTHROPLASTY BIPOLAR HIP (HEMIARTHROPLASTY);  Surgeon: [name], MD;  Location: [facility] ORS;  Service: Orthopedics;  Laterality: Right;   INSERT / REPLACE / REMOVE PACEMAKER     JOINT REPLACEMENT     shoulder and knees   KNEE ARTHROSCOPY     LUNG LOBECTOMY Right 01/31/2013   Procedure: RIGHT PULMONARY LOBECTOMY; Location: [place]; Surgeon: Nestor Lewandowsky, MD   MICROLARYNGOSCOPY Right 08/10/2021   Procedure: MICRODIRECT LARYNGOSCOPY WITH BIOPSY OF TONGUE BASE;  Surgeon: Beverly Gust, MD;  Location: [facility] ORS;  Service: ENT;  Laterality: Right;   PACEMAKER INSERTION  12/2012   Dual chanber pacemaker generator   POLYPECTOMY     POSTERIOR LUMBAR FUSION     Procedure: POSTERIOR LUMBAR INTERBODY FUSION, INTERBODY PROSTHESIS, POSTERIOR LATERAL ARTHRODESIS, POSTERIOR NON-SEGMENTAL INSTRUMENTATION LUMBAR FIVE- SACRAL ONE; Location: [facility]; Surgeon: Newman Pies, MD   PROSTATECTOMY     TEE WITHOUT CARDIOVERSION N/A 08/19/2022   Procedure: TRANSESOPHAGEAL ECHOCARDIOGRAM (TEE);  Surgeon: Corey Skains, MD;  Location: [facility] ORS;  Service: Cardiovascular;  Laterality: N/A;   TONGUE BIOPSY N/A 08/10/2021   Procedure: TONGUE BIOPSY;  Surgeon: Beverly Gust, MD;  Location: [facility] ORS;  Service: ENT;  Laterality: N/A;   TOTAL KNEE ARTHROPLASTY Bilateral    TOTAL SHOULDER ARTHROPLASTY Left 07/10/2015   Procedure: TOTAL  SHOULDER ARTHROPLASTY;  Surgeon: [name], MD;  Location: [facility] ORS;  Service: Orthopedics;  Laterality: Left;   TOTAL SHOULDER REPLACEMENT      Social History   Socioeconomic History   Marital status: Married    Spouse name: Not on file   Number of children: 2   Years of education: 16   Highest education level: Not on file  Occupational History   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   Patient is right handed.      Live at home with wife   Social Determinants of Health   Financial Resource Strain: Not on file  Food Insecurity: No Food Insecurity (08/14/2022)   Hunger Vital Sign    Worried About Running Out of Food in the Last Year: Never true    Ran Out of Food in the Last Year: Never true  Transportation Needs: No Transportation Needs (08/14/2022)   PRAPARE - Hydrologist (Medical): No    Lack of Transportation (Non-Medical): No  Physical Activity: Not on file  Stress: Not on file  Social Connections: Not on file  Intimate Partner Violence: Not At Risk (08/14/2022)   Humiliation, Afraid, Rape, and Kick questionnaire    Fear of Current or Ex-Partner: No    Emotionally Abused: No    Physically Abused: No    Sexually Abused: No    Family History  Problem Relation Age of Onset   Heart attack Mother    Heart attack Father    Breast cancer Sister    Asthma Sister    No Known Allergies I? Current Outpatient Medications  Medication Sig Dispense Refill   acetaminophen (TYLENOL) 500  MG tablet Take 500 mg by mouth every 4 (four) hours as needed for moderate pain.     amLODipine (NORVASC) 10 MG tablet Take 1 tablet (10 mg total) by mouth daily.     amoxicillin (AMOXIL) 500 MG tablet Take 1 tablet (500 mg total) by mouth 2 (two) times daily. 60 tablet 1   aspirin EC 81 MG tablet Take 81 mg by mouth daily.      atorvastatin (LIPITOR) 20 MG tablet Take 40 mg by mouth at bedtime.     benzonatate (TESSALON) 200 MG capsule Take 1 capsule (200 mg total) by mouth 2 (two) times daily as needed for cough. 20 capsule 0   calcitRIOL (ROCALTROL) 0.25 MCG capsule Take 0.25 mcg by mouth in the morning.     Calcium Carb-Cholecalciferol (CALCIUM 600 + D PO) Take 1 tablet by mouth daily after lunch.     cetirizine (ZYRTEC) 10 MG tablet Take 10 mg by mouth at bedtime.     chlorproMAZINE (THORAZINE) 25 MG tablet Take 1 tablet (25 mg total) by mouth 3 (three) times daily as needed (hiccups.).     clopidogrel (PLAVIX) 75 MG tablet Take 1 tablet (75 mg total) by mouth daily. Restart Plavix after completion of Eliquis prescription. 30 tablet 0   docusate sodium (COLACE) 100 MG capsule Take 300 mg by mouth daily after lunch.     donepezil (ARICEPT) 10 MG tablet Take 10 mg by mouth at bedtime.     DULoxetine (CYMBALTA) 20 MG capsule Take 20 mg by mouth daily.     ezetimibe (ZETIA) 10 MG tablet Take 10 mg by mouth every evening.     feeding supplement (ENSURE ENLIVE / ENSURE PLUS) LIQD Take 237 mLs by mouth 2 (two) times daily between meals. 237 mL 12   Ferrous Sulfate (IRON SLOW RELEASE) 140 (45 Fe) MG TBCR Take 1 tablet by mouth 2 (two) times daily.     FLUoxetine (PROZAC) 10 MG capsule Take 10 mg by mouth daily.     haloperidol (HALDOL) 0.5 MG tablet Take 0.5 mg by  mouth at bedtime as needed.     ipratropium (ATROVENT) 0.06 % nasal spray Place 2-4 sprays into both nostrils at bedtime.     lidocaine (LIDODERM) 5 % Place 1 patch onto the skin daily. Remove & Discard patch within 12 hours or as directed  by MD 30 patch 0   losartan (COZAAR) 100 MG tablet Take 100 mg by mouth daily.     melatonin 5 MG TABS Take 10 mg by mouth at bedtime.     metoprolol succinate (TOPROL-XL) 25 MG 24 hr tablet Take 25 mg by mouth daily.     Multiple Vitamin (MULTIVITAMIN WITH MINERALS) TABS tablet Take 1 tablet by mouth daily after lunch.     Multiple Vitamins-Minerals (PRESERVISION AREDS 2) CAPS Take 1 tablet by mouth 2 (two) times daily.     oxyCODONE-acetaminophen (PERCOCET/ROXICET) 5-325 MG tablet Take 2 tablets by mouth every 6 (six) hours. 30 tablet 0   pantoprazole (PROTONIX) 40 MG tablet Take 40 mg by mouth every evening.     polyethylene glycol (MIRALAX / GLYCOLAX) 17 g packet Take 17 g by mouth daily as needed for moderate constipation or mild constipation. 14 each 0   Probiotic Product (PROBIOTIC PO) Take 1 capsule by mouth in the morning.     tiZANidine (ZANAFLEX) 4 MG tablet Take 4 mg by mouth in the morning and at bedtime.     traMADol (ULTRAM) 50 MG tablet Take 1 tablet (50 mg total) by mouth every 6 (six) hours as needed for moderate pain. 30 tablet 0   No current facility-administered medications for this visit.     Abtx:  Anti-infectives (From admission, onward)    None       REVIEW OF SYSTEMS:  Const: negative fever, negative chills, 40 pound weight loss Eyes: negative diplopia or visual changes, negative eye pain ENT: negative coryza, negative sore throat Resp: negative cough, hemoptysis, dyspnea Cards: negative for chest pain, palpitations, lower extremity edema GU: negative for frequency, dysuria and hematuria GI: Negative for abdominal pain, diarrhea, bleeding, constipation Skin: negative for rash and pruritus Heme: negative for easy bruising and gum/nose bleeding MSK weakness, back pain,   Neurolo:negative for headaches, dizziness, vertigo, memory problems  Psych: negative for feelings of anxiety, depression  Endocrine: negative for thyroid, diabetes Allergy/Immunology-  negative for any medication or food allergies ?  Objective:  VITALS:  BP 134/66   Pulse (!) 59   Temp (!) 97.5 F (36.4 C) (Temporal)   PHYSICAL EXAM:  General: Alert, cooperative, no distress, pale appears stated age. Wheel chair Head: Normocephalic, without obvious abnormality, atraumatic. Eyes: Conjunctivae clear, anicteric sclerae. Pupils are equal ENT Nares normal. No drainage or sinus tenderness. Lips, mucosa, and tongue normal. No Thrush Neck: Supple, symmetrical, no adenopathy, thyroid: non tender no carotid bruit and no JVD. Lungs: Clear to auscultation bilaterally. No Wheezing or Rhonchi. No rales. Heart: Regular rate and rhythm, no murmur, rub or gallop. Abdomen: Soft, Extremities: some ankle edema Skin: not able to examine  Lymph: Cervical, supraclavicular normal. Neurologic: not examined in detail Pertinent Labs [facility] labs      Microbiology: No results found for this or any previous visit (from the past 240 hour(s)).  IMAGING RESULTS: I have personally reviewed the films ? Impression/Recommendation ?Enterococcus bacteremia with UTI TEE neg Had back pain- hardware present lumbar area- could not do MRI, there was a question of epidural abscess So he is empirically being treate dlike a vertebral infection After getting IV ampicillin for 17 days  he I currently takeing Amoxicillin 1 gram Po Q 8. HE has completed more than 6 weeks- will chage amoxicillin to '500mg'$  Po TID for 6 more weeks Spoke to PA faith at the NH She will get labs CBC/CMP/ESR and CRP She says there was only a small tear at the base of the scrotum and it is healing  Anemia   Multiple malignancies- SCC rt LL lung s/p lobectomy SCC tongue s/p radiation   Ca prostate s/p prostatectomy   CAD s/p CABG       Mild cognitive impairment- on aricept   ?follow up 2 months ___________________________________________________ Discussed with patient, wife and provider in the NH Note:  This  document was prepared using Dragon voice recognition software and may include unintentional dictation errors.

## 2023-01-06 NOTE — Patient Instructions (Addendum)
You are here to follow up on the infection you had in OCT 2023- You had enterococcus bacteremia, lumbar spine pain and previous fusion L5-S1- As we could not do MRI we empirically treated for a spine infection. You also have a pacemaker- so we will have to indefinitely keep you on Amoxicillin '500mg'$  Po BID- take a probiotic Will check ESR/CRP Follow up 1 year

## 2023-01-10 DIAGNOSIS — R2681 Unsteadiness on feet: Secondary | ICD-10-CM | POA: Diagnosis not present

## 2023-01-10 DIAGNOSIS — S72001D Fracture of unspecified part of neck of right femur, subsequent encounter for closed fracture with routine healing: Secondary | ICD-10-CM | POA: Diagnosis not present

## 2023-01-10 DIAGNOSIS — N390 Urinary tract infection, site not specified: Secondary | ICD-10-CM | POA: Diagnosis not present

## 2023-01-10 DIAGNOSIS — D509 Iron deficiency anemia, unspecified: Secondary | ICD-10-CM | POA: Diagnosis not present

## 2023-01-10 DIAGNOSIS — I712 Thoracic aortic aneurysm, without rupture, unspecified: Secondary | ICD-10-CM | POA: Diagnosis not present

## 2023-01-10 DIAGNOSIS — I5032 Chronic diastolic (congestive) heart failure: Secondary | ICD-10-CM | POA: Diagnosis not present

## 2023-01-10 DIAGNOSIS — N184 Chronic kidney disease, stage 4 (severe): Secondary | ICD-10-CM | POA: Diagnosis not present

## 2023-01-10 DIAGNOSIS — I130 Hypertensive heart and chronic kidney disease with heart failure and stage 1 through stage 4 chronic kidney disease, or unspecified chronic kidney disease: Secondary | ICD-10-CM | POA: Diagnosis not present

## 2023-01-10 DIAGNOSIS — J449 Chronic obstructive pulmonary disease, unspecified: Secondary | ICD-10-CM | POA: Diagnosis not present

## 2023-01-10 DIAGNOSIS — M6281 Muscle weakness (generalized): Secondary | ICD-10-CM | POA: Diagnosis not present

## 2023-01-11 DIAGNOSIS — I5032 Chronic diastolic (congestive) heart failure: Secondary | ICD-10-CM | POA: Diagnosis not present

## 2023-01-11 DIAGNOSIS — N183 Chronic kidney disease, stage 3 unspecified: Secondary | ICD-10-CM | POA: Diagnosis not present

## 2023-01-11 DIAGNOSIS — R1903 Right lower quadrant abdominal swelling, mass and lump: Secondary | ICD-10-CM | POA: Diagnosis not present

## 2023-01-17 ENCOUNTER — Telehealth: Payer: Self-pay | Admitting: *Deleted

## 2023-01-17 NOTE — Progress Notes (Signed)
  Care Coordination   Note   01/17/2023 Name: Rick Mcbride. MRN: [id number] DOB: 03/20/1941  Min T [name] is a 82 y.o. year old male who sees Sparks, Leonie Douglas, MD for primary care. I reached out to [employer]. by phone today to offer care coordination services.  [name] was given information about Care Coordination services today including:   The Care Coordination services include support from the care team which includes your Nurse Coordinator, Clinical Social Worker, or Pharmacist.  The Care Coordination team is here to help remove barriers to the health concerns and goals most important to you. Care Coordination services are voluntary, and the patient may decline or stop services at any time by request to their care team member.   Care Coordination Consent Status: Patient agreed to services and verbal consent obtained.   Follow up plan:  Telephone appointment with care coordination team member scheduled for:  01/31/2023  Encounter Outcome:  Pt. Scheduled  Julian Hy, [place] Direct Dial: [phone]

## 2023-01-18 DIAGNOSIS — R1031 Right lower quadrant pain: Secondary | ICD-10-CM | POA: Diagnosis not present

## 2023-01-18 DIAGNOSIS — M6200 Separation of muscle (nontraumatic), unspecified site: Secondary | ICD-10-CM | POA: Diagnosis not present

## 2023-01-18 DIAGNOSIS — K429 Umbilical hernia without obstruction or gangrene: Secondary | ICD-10-CM | POA: Diagnosis not present

## 2023-01-18 DIAGNOSIS — I441 Atrioventricular block, second degree: Secondary | ICD-10-CM | POA: Diagnosis not present

## 2023-01-21 DIAGNOSIS — J431 Panlobular emphysema: Secondary | ICD-10-CM | POA: Diagnosis not present

## 2023-01-21 DIAGNOSIS — N183 Chronic kidney disease, stage 3 unspecified: Secondary | ICD-10-CM | POA: Diagnosis not present

## 2023-01-21 DIAGNOSIS — E782 Mixed hyperlipidemia: Secondary | ICD-10-CM | POA: Diagnosis not present

## 2023-01-21 DIAGNOSIS — I10 Essential (primary) hypertension: Secondary | ICD-10-CM | POA: Diagnosis not present

## 2023-01-21 DIAGNOSIS — D631 Anemia in chronic kidney disease: Secondary | ICD-10-CM | POA: Diagnosis not present

## 2023-01-21 DIAGNOSIS — I779 Disorder of arteries and arterioles, unspecified: Secondary | ICD-10-CM | POA: Diagnosis not present

## 2023-01-21 DIAGNOSIS — R739 Hyperglycemia, unspecified: Secondary | ICD-10-CM | POA: Diagnosis not present

## 2023-01-31 ENCOUNTER — Ambulatory Visit: Payer: Self-pay | Admitting: *Deleted

## 2023-01-31 DIAGNOSIS — M19011 Primary osteoarthritis, right shoulder: Secondary | ICD-10-CM | POA: Diagnosis not present

## 2023-01-31 DIAGNOSIS — M47812 Spondylosis without myelopathy or radiculopathy, cervical region: Secondary | ICD-10-CM | POA: Diagnosis not present

## 2023-01-31 DIAGNOSIS — Z96611 Presence of right artificial shoulder joint: Secondary | ICD-10-CM | POA: Diagnosis not present

## 2023-01-31 DIAGNOSIS — M7581 Other shoulder lesions, right shoulder: Secondary | ICD-10-CM | POA: Diagnosis not present

## 2023-01-31 NOTE — Patient Outreach (Signed)
  Care Coordination   Initial Visit Note   01/31/2023 Name: Rick Mcbride. MRN: [id number] DOB: 01-Dec-1940  Rick T Ivor Bloyd. is a 82 y.o. year old male who sees Sparks, Leonie Douglas, MD for primary care. I spoke with [name], wife of Rick Mcbride. by phone today.  What matters to the patients health and wellness today?  Wife report today is not a good day for assessment.   Patient has MD appointment and the aide did not show to the home to assist with preparation.  She request call back tomorrow.    SDOH assessments and interventions completed:  No     Care Coordination Interventions:  No, not indicated   Follow up plan: Follow up call scheduled for 3/26    Encounter Outcome:  Pt. Request to Call [place], RN, MSN, [place] Management Care Management Coordinator [phone]

## 2023-02-01 ENCOUNTER — Ambulatory Visit: Payer: Self-pay | Admitting: *Deleted

## 2023-02-01 ENCOUNTER — Encounter: Payer: Self-pay | Admitting: *Deleted

## 2023-02-01 NOTE — Patient Outreach (Signed)
  Care Coordination   Initial Visit Note   02/01/2023 Name: Rick Mcbride. MRN: [id number] DOB: June 01, 1941  Rick Mcbride. is a 82 y.o. year old male who sees Sparks, Leonie Douglas, MD for primary care. I spoke with Rick Mcbride, wife of Rick Mcbride. by phone today.  What matters to the patients health and wellness today?  Patient has dementia, lives with wife.  She is primary caregiver, has aide from [place] 3 days a week, state this is all they can afford out of pocket.  Active with [facility], provided with resources for VA benefits as well.  Denies any urgent concerns, encouraged to contact this care manager with questions.      Goals Addressed             This Visit's Progress    Consistent in home assistance to help manage chronic medical conditions       Care Coordination Interventions: Evaluation of current treatment plan related to CHF and HTN and patient's adherence to plan as established by provider Advised patient to review paperwork completed for assistance, looking to see if it is VA aide and attendance Provided education to patient re: resources in the community that may offer support for incontinence supplies (Elder Care) Reviewed medications with patient and discussed adherence and affordability Reviewed scheduled/upcoming provider appointments including podiatry on 4/11, PCP on 5/1, nephrology on 5/2, and ortho on 6/28 Discussed plans with patient for ongoing care management follow up and provided patient with direct contact information for care management team Screening for signs and symptoms of depression related to chronic disease state  Assessed social determinant of health barriers         SDOH assessments and interventions completed:  Yes  SDOH Interventions Today    Flowsheet Row Most Recent Value  SDOH Interventions   Food Insecurity Interventions Intervention Not Indicated  Housing Interventions Intervention Not Indicated  Transportation  Interventions Intervention Not Indicated        Care Coordination Interventions:  Yes, provided   Interventions Today    Flowsheet Row Most Recent Value  Chronic Disease   Chronic disease during today's visit Congestive Heart Failure (CHF), Hypertension (HTN)  General Interventions   General Interventions Discussed/Reviewed General Interventions Reviewed, Doctor Visits, Level of Care  Doctor Visits Discussed/Reviewed Doctor Visits Reviewed, PCP, Specialist  PCP/Specialist Visits Compliance with follow-up visit  Level of [place]  Education Interventions   Education Provided Provided Education  Provided Verbal Education On When to see the doctor, [employer], Medication  Pharmacy Interventions   Pharmacy Dicussed/Reviewed Affording Medications, Pharmacy Topics Reviewed        Follow up plan: Follow up call scheduled for 4/16    Encounter Outcome:  Pt. Visit Completed   Valente David, RN, MSN, [place] Care Management Care Management Coordinator [phone]

## 2023-02-01 NOTE — Patient Instructions (Signed)
Visit Information  Thank you for taking time to visit with me today. Please don't hesitate to contact me if I can be of assistance to you before our next scheduled telephone appointment.  Following are the goals we discussed today:  Ask daughter if paperwork submitted was for Aide and Attendance through the [place] system. Call [facility] to follow up on resources for incontinence supplies.  [place] [phone]**   Our next appointment is by telephone on 4/16  Please call the care guide team at [phone] if you need to cancel or reschedule your appointment.   Please call the Suicide and Crisis Lifeline: 988 call the [place] National Suicide Prevention Lifeline: 401-529-7986 or TTY: 725-759-0005 TTY 787 114 7426) to talk to a trained counselor call 1-800-273-TALK (toll free, 24 hour hotline) call 911 if you are experiencing a Mental Health or [place] or need someone to talk to.  Patient verbalizes understanding of instructions and care plan provided today and agrees to view in [place]. Active MyChart status and patient understanding of how to access instructions and care plan via MyChart confirmed with patient.     The patient has been provided with contact information for the care management team and has been advised to call with any health related questions or concerns.   Valente David, RN, MSN, [place] Care Management Care Management Coordinator [phone]

## 2023-02-07 DIAGNOSIS — Z87440 Personal history of urinary (tract) infections: Secondary | ICD-10-CM | POA: Diagnosis not present

## 2023-02-07 DIAGNOSIS — S72001D Fracture of unspecified part of neck of right femur, subsequent encounter for closed fracture with routine healing: Secondary | ICD-10-CM | POA: Diagnosis not present

## 2023-02-07 DIAGNOSIS — I5032 Chronic diastolic (congestive) heart failure: Secondary | ICD-10-CM | POA: Diagnosis not present

## 2023-02-07 DIAGNOSIS — D509 Iron deficiency anemia, unspecified: Secondary | ICD-10-CM | POA: Diagnosis not present

## 2023-02-07 DIAGNOSIS — I712 Thoracic aortic aneurysm, without rupture, unspecified: Secondary | ICD-10-CM | POA: Diagnosis not present

## 2023-02-07 DIAGNOSIS — N184 Chronic kidney disease, stage 4 (severe): Secondary | ICD-10-CM | POA: Diagnosis not present

## 2023-02-07 DIAGNOSIS — J449 Chronic obstructive pulmonary disease, unspecified: Secondary | ICD-10-CM | POA: Diagnosis not present

## 2023-02-07 DIAGNOSIS — R2681 Unsteadiness on feet: Secondary | ICD-10-CM | POA: Diagnosis not present

## 2023-02-07 DIAGNOSIS — I130 Hypertensive heart and chronic kidney disease with heart failure and stage 1 through stage 4 chronic kidney disease, or unspecified chronic kidney disease: Secondary | ICD-10-CM | POA: Diagnosis not present

## 2023-02-07 DIAGNOSIS — M6281 Muscle weakness (generalized): Secondary | ICD-10-CM | POA: Diagnosis not present

## 2023-02-22 ENCOUNTER — Ambulatory Visit: Payer: Self-pay | Admitting: *Deleted

## 2023-02-22 NOTE — Patient Outreach (Signed)
  Care Coordination   Follow Up Visit Note   02/22/2023 Name: Rick Mcbride. MRN: [id number] DOB: Nov 30, 1940  Rick Mcbride. is a 82 y.o. year old male who sees Sparks, Duane Lope, MD for primary care. I spoke with Rick Mcbride, wife of and Rick Mcbride, daughter of  Rick Mcbride. by phone today.  What matters to the patients health and wellness today?  Per daughter, to have patient in appropriate level of care for safe management of dementia    Goals Addressed             This Visit's Progress    Consistent in home assistance to help manage chronic medical conditions   On track    Care Coordination Interventions: Evaluation of current treatment plan related to CHF and HTN and patient's adherence to plan as established by provider Advised patient to review paperwork completed for assistance, looking to see if it is [facility] aide and attendance Provided education to patient re: resources in the community that may offer support for incontinence supplies ([facility]) Reviewed medications with patient and discussed adherence and affordability Reviewed scheduled/upcoming provider appointments including podiatry on 4/11, PCP on 5/1, nephrology on 5/2, and ortho on 6/28 Discussed plans with patient for ongoing care management follow up and provided patient with direct contact information for care management team Screening for signs and symptoms of depression related to chronic disease state  Assessed social determinant of health barriers         SDOH assessments and interventions completed:  No     Care Coordination Interventions:  Yes, provided   Interventions Today    Flowsheet Row Most Recent Value  Chronic Disease   Chronic disease during today's visit Other  [Dementia and resources]  General Interventions   General Interventions Discussed/Reviewed Communication with, [employer], General Interventions Reviewed  Doctor Visits Discussed/Reviewed Doctor Visits  Reviewed, PCP  [facility] appointment on 5/1]  PCP/Specialist Visits Compliance with follow-up visit  Communication with Social Work  [Daughter looking to have patient and wife in ALF]  Level of Care Personal Care Services  Education Interventions   Provided Verbal Education On [employer], When to see the doctor  [CSW referral for ALF/ILF]        Follow up plan: Follow up call scheduled for 5/6    Encounter Outcome:  Pt. Visit Completed   Kemper Durie, RN, MSN, [facility] Care Management Care Management Coordinator [phone]

## 2023-02-22 NOTE — Patient Instructions (Signed)
Visit Information  Thank you for taking time to visit with me today. Please don't hesitate to contact me if I can be of assistance to you before our next scheduled telephone appointment.  Following are the goals we discussed today:  Listen for call from social worker tomorrow.   Remember PCP appointment on 5/1  Our next appointment is by telephone on 5/6  Please call the care guide team at [phone] if you need to cancel or reschedule your appointment.   Please call the Suicide and Crisis Lifeline: 988 call the [place] National Suicide Prevention Lifeline: 504-250-8468 or TTY: (864)864-4594 TTY 410 255 1322) to talk to a trained counselor call 1-800-273-TALK (toll free, 24 hour hotline) call 911 if you are experiencing a Mental Health or Behavioral Health Crisis or need someone to talk to.  Patient verbalizes understanding of instructions and care plan provided today and agrees to view in MyChart. Active MyChart status and patient understanding of how to access instructions and care plan via MyChart confirmed with patient.     The patient has been provided with contact information for the care management team and has been advised to call with any health related questions or concerns.   Kemper Durie [facility] Care Management Care Management Coordinator [phone]

## 2023-02-23 ENCOUNTER — Ambulatory Visit: Payer: Self-pay | Admitting: *Deleted

## 2023-02-23 NOTE — Patient Instructions (Signed)
Visit Information  Thank you for taking time to visit with me today. Please don't hesitate to contact me if I can be of assistance to you.   Following are the goals we discussed today:   Goals Addressed             This Visit's Progress    Family requesting information related to nursing home placement  and the PACE program       Care Coordination Interventions: Patient's family will continue to consider option of the PACE program or long term care placement for patient  Medicaid application process discussed, Fl2 to be requested by provider when decision is made regarding placement option Patient's family to contact this social worker once decision is made for additional support navigating the process          If you are experiencing a Mental Health or Behavioral Health Crisis or need someone to talk to, please call 911   Patient verbalizes understanding of instructions and care plan provided today and agrees to view in MyChart. Active MyChart status and patient understanding of how to access instructions and care plan via MyChart confirmed with patient.     No further follow up required: patient's daughter to contact this social worker once decision in made regarding placement option. This social worker's contact information provided  [employer], LCSW Clinical Social Worker  [facility] Care Management [phone]

## 2023-02-23 NOTE — Patient Outreach (Addendum)
  Care Coordination   Follow Up Visit Note   02/23/2023 Name: Rick Mcbride. MRN: [id number] DOB: [birth year]  [name]. is a 82 y.o. year old male who sees Sparks, Duane Lope, MD for primary care. I spoke with  Heinz T Donzetta Matters. Spouse and daughter by phone today.  What matters to the patients health and wellness today?  Long term care options. Patient's spouse and daughter confirmed that patient's needs are progressing and they are needing additional support to care for him. Patient's daughter confirms that patient currently has a aid 3 days a week in the morning, however patient needing more care. Patient's family considering the PACE program as well as long term care in a nursing home.   Goals Addressed             This Visit's Progress    Family requesting information related to nursing home placement  and the PACE program       Care Coordination Interventions: Patient's family will continue to consider option of the PACE program or long term care placement for patient  Medicaid application process discussed, Fl2 to be requested by provider when decision is made regarding placement option Patient's family to contact this social worker once decision is made for additional support navigating the process          SDOH assessments and interventions completed:  Yes  SDOH Interventions Today    Flowsheet Row Most Recent Value  SDOH Interventions   Food Insecurity Interventions Intervention Not Indicated  Housing Interventions Intervention Not Indicated  Transportation Interventions Intervention Not Indicated        Care Coordination Interventions:  Yes, provided  Interventions Today    Flowsheet Row Most Recent Value  Chronic Disease   Chronic disease during today's visit Other  [Dementia]  General Interventions   General Interventions Discussed/Reviewed General Interventions Discussed, Community Resources, Level of Care  Level of Care Skilled  Nursing Facility, Adult Daycare, Applications  [Patient's spouse and daughter considering the PACE program or nursing home placement-both discussed as options]  Applications Medicaid  [Long term care Medicaid application process discussed]  Education Interventions   Applications Medicaid  [Long term care Medicaid application process discussed]       Follow up plan: No further intervention required.   Encounter Outcome:  Pt. Visit Completed

## 2023-02-24 ENCOUNTER — Emergency Department: Payer: PPO

## 2023-02-24 ENCOUNTER — Emergency Department
Admission: EM | Admit: 2023-02-24 | Discharge: 2023-02-25 | Disposition: A | Discharge status: Home / Self Care | Payer: PPO | Attending: Emergency Medicine | Admitting: Emergency Medicine

## 2023-02-24 DIAGNOSIS — M549 Dorsalgia, unspecified: Secondary | ICD-10-CM | POA: Diagnosis not present

## 2023-02-24 DIAGNOSIS — R1111 Vomiting without nausea: Secondary | ICD-10-CM | POA: Diagnosis not present

## 2023-02-24 DIAGNOSIS — R7989 Other specified abnormal findings of blood chemistry: Secondary | ICD-10-CM | POA: Diagnosis not present

## 2023-02-24 DIAGNOSIS — R112 Nausea with vomiting, unspecified: Secondary | ICD-10-CM | POA: Diagnosis not present

## 2023-02-24 DIAGNOSIS — N1330 Unspecified hydronephrosis: Secondary | ICD-10-CM | POA: Diagnosis not present

## 2023-02-24 DIAGNOSIS — R531 Weakness: Secondary | ICD-10-CM | POA: Diagnosis not present

## 2023-02-24 DIAGNOSIS — I10 Essential (primary) hypertension: Secondary | ICD-10-CM | POA: Diagnosis not present

## 2023-02-24 DIAGNOSIS — N201 Calculus of ureter: Secondary | ICD-10-CM | POA: Diagnosis not present

## 2023-02-24 DIAGNOSIS — N281 Cyst of kidney, acquired: Secondary | ICD-10-CM | POA: Diagnosis not present

## 2023-02-24 LAB — CBC WITH DIFFERENTIAL/PLATELET
Abs Immature Granulocytes: 0.03 10*3/uL (ref 0.00–0.07)
Basophils Absolute: 0 10*3/uL (ref 0.0–0.1)
Basophils Relative: 0 %
Eosinophils Absolute: 0 10*3/uL (ref 0.0–0.5)
Eosinophils Relative: 0 %
HCT: 36.9 % — ABNORMAL LOW (ref 39.0–52.0)
Hemoglobin: 11.3 g/dL — ABNORMAL LOW (ref 13.0–17.0)
Immature Granulocytes: 0 %
Lymphocytes Relative: 6 %
Lymphs Abs: 0.5 10*3/uL — ABNORMAL LOW (ref 0.7–4.0)
MCH: 28.3 pg (ref 26.0–34.0)
MCHC: 30.6 g/dL (ref 30.0–36.0)
MCV: 92.3 fL (ref 80.0–100.0)
Monocytes Absolute: 0.3 10*3/uL (ref 0.1–1.0)
Monocytes Relative: 4 %
Neutro Abs: 8.3 10*3/uL — ABNORMAL HIGH (ref 1.7–7.7)
Neutrophils Relative %: 90 %
Platelets: 187 10*3/uL (ref 150–400)
RBC: 4 MIL/uL — ABNORMAL LOW (ref 4.22–5.81)
RDW: 15.4 % (ref 11.5–15.5)
WBC: 9.2 10*3/uL (ref 4.0–10.5)
nRBC: 0 % (ref 0.0–0.2)

## 2023-02-24 LAB — COMPREHENSIVE METABOLIC PANEL
ALT: 20 U/L (ref 0–44)
AST: 25 U/L (ref 15–41)
Albumin: 4.2 g/dL (ref 3.5–5.0)
Alkaline Phosphatase: 108 U/L (ref 38–126)
Anion gap: 14 (ref 5–15)
BUN: 44 mg/dL — ABNORMAL HIGH (ref 8–23)
CO2: 20 mmol/L — ABNORMAL LOW (ref 22–32)
Calcium: 9.8 mg/dL (ref 8.9–10.3)
Chloride: 103 mmol/L (ref 98–111)
Creatinine, Ser: 2.32 mg/dL — ABNORMAL HIGH (ref 0.61–1.24)
GFR, Estimated: 27 mL/min — ABNORMAL LOW (ref >60)
Glucose, Bld: 143 mg/dL — ABNORMAL HIGH (ref 70–99)
Potassium: 4.4 mmol/L (ref 3.5–5.1)
Sodium: 137 mmol/L (ref 135–145)
Total Bilirubin: 0.6 mg/dL (ref 0.3–1.2)
Total Protein: 7.3 g/dL (ref 6.5–8.1)

## 2023-02-24 LAB — TROPONIN I (HIGH SENSITIVITY)
Troponin I (High Sensitivity): 13 ng/L (ref <18)
Troponin I (High Sensitivity): 12 ng/L (ref <18)

## 2023-02-24 LAB — URINALYSIS, ROUTINE W REFLEX MICROSCOPIC
Bacteria, UA: NONE SEEN
Bilirubin Urine: NEGATIVE
Glucose, UA: NEGATIVE mg/dL
Ketones, ur: NEGATIVE mg/dL
Leukocytes,Ua: NEGATIVE
Nitrite: NEGATIVE
Protein, ur: 30 mg/dL — AB
RBC / HPF: >50 RBC/hpf (ref 0–5)
Specific Gravity, Urine: 1.018 (ref 1.005–1.030)
Squamous Epithelial / HPF: NONE SEEN /HPF (ref 0–5)
pH: 5 (ref 5.0–8.0)

## 2023-02-24 MED ORDER — MORPHINE SULFATE (PF) 2 MG/ML IV SOLN
2.0000 mg | Freq: Once | INTRAVENOUS | Status: AC
  Administered 2023-02-24: 2 mg via INTRAVENOUS
  Filled 2023-02-24: qty 1

## 2023-02-24 MED ORDER — LIDOCAINE VISCOUS HCL 2 % MT SOLN
15.0000 mL | Freq: Once | OROMUCOSAL | Status: AC
  Administered 2023-02-24: 15 mL via OROMUCOSAL
  Filled 2023-02-24: qty 15

## 2023-02-24 MED ORDER — ONDANSETRON HCL 4 MG/2ML IJ SOLN
4.0000 mg | Freq: Once | INTRAMUSCULAR | Status: AC
  Administered 2023-02-24: 4 mg via INTRAVENOUS
  Filled 2023-02-24: qty 2

## 2023-02-24 MED ORDER — ONDANSETRON 4 MG PO TBDP: 4.0000 mg | ORAL_TABLET | Freq: Three times a day (TID) | ORAL | 0 refills | Status: DC | PRN

## 2023-02-24 NOTE — ED Provider Notes (Signed)
[facility] Provider Note    Event Date/Time   First MD Initiated Contact with Patient 02/24/23 1847     (approximate)   History   Emesis   HPI  Rick T Bernd Crom. is a 82 y.o. male who presents to the emergency department today because of concerns for emesis.  Patient himself cannot give great history, most history is obtained from daughter at bedside.  She states that he does have issues with some chronic pain.  Today however did complain of some abdominal pain.  Then had episode of emesis.  No reported fevers.  No diarrhea.     Physical Exam   Triage Vital Signs: ED Triage Vitals [02/24/23 1850]  Enc Vitals Group     BP (!) 146/69     Pulse Rate 63     Resp 12     Temp      Temp src      SpO2 95 %     Weight      Height      Head Circumference      Peak Flow      Pain Score 0     Pain Loc      Pain Edu?      Excl. in GC?     Most recent vital signs: Vitals:   02/24/23 1850  BP: (!) 146/69  Pulse: 63  Resp: 12  SpO2: 95%   General: Awake, alert, not completely oriented. CV:  Good peripheral perfusion. Regular rate and rhythm. Resp:  Normal effort. Lungs clear. Abd:  No distention. Non tender. Other:  Large amount of soft stool in rectum. No firm stool felt.   ED Results / Procedures / Treatments   Labs (all labs ordered are listed, but only abnormal results are displayed) Labs Reviewed  COMPREHENSIVE METABOLIC PANEL - Abnormal; Notable for the following components:      Result Value   CO2 20 (*)    Glucose, Bld 143 (*)    BUN 44 (*)    Creatinine, Ser 2.32 (*)    GFR, Estimated 27 (*)    All other components within normal limits  CBC WITH DIFFERENTIAL/PLATELET - Abnormal; Notable for the following components:   RBC 4.00 (*)    Hemoglobin 11.3 (*)    HCT 36.9 (*)    Neutro Abs 8.3 (*)    Lymphs Abs 0.5 (*)    All other components within normal limits  URINALYSIS, ROUTINE W REFLEX MICROSCOPIC - Abnormal; Notable  for the following components:   Color, Urine YELLOW (*)    APPearance HAZY (*)    Hgb urine dipstick LARGE (*)    Protein, ur 30 (*)    All other components within normal limits  TROPONIN I (HIGH SENSITIVITY)  TROPONIN I (HIGH SENSITIVITY)     EKG  I, Phineas Semen, attending physician, personally viewed and interpreted this EKG  EKG Time: 1853 Rate: 72 Rhythm: sinus rhythm Axis: normal Intervals: qtc 469 QRS: narrow ST changes: no st elevation, t wave II, III, aVF, V3, v4, v5, v6 Impression: abnormal ekg   RADIOLOGY I independently interpreted and visualized the CT abd/pel. My interpretation: large amount of stool. No free air. Radiology interpretation:  IMPRESSION:  1. Markedly distended stool-filled rectum compatible with fecal  impaction. There is potentially some wall thickening of the lower  rectum and stercoral colitis is not excluded.  2. Mild to moderate bilateral hydronephrosis and proximal  hydroureter. This may be due  to displacement of the bladder by the  prominent stool in the rectal vault.  3. 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  4. Nonobstructive left nephrolithiasis.  5. 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 This is been  reported on multiple prior exams including 08/14/2022.  6. Mild to moderate cardiomegaly. Coronary atherosclerosis.  7. 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  8. Emphysema.      PROCEDURES:  Critical Care performed: No    MEDICATIONS ORDERED IN ED: Medications - No data to display   IMPRESSION / MDM /  ASSESSMENT AND PLAN / ED COURSE  I reviewed the triage vital signs and the nursing notes.                              Differential diagnosis includes, but is not limited to, gastroenteritis, gallbladder disease, dehydration.  Patient's presentation is most consistent with acute presentation with potential threat to life or bodily function.   The patient is on the cardiac monitor to evaluate for evidence of arrhythmia and/or significant heart rate changes.  Patient presented to the emergency department today because of concerns for nausea and vomiting.  Patient apparently did complain of some abdominal pain however on my exam patient's abdomen is nontender.  Patient is afebrile here in the emergency department.  Blood work without significant leukocytosis.  Mild elevation of creatinine however when care everywhere blood work checked it appears to be not far off from patient's baseline.  Given reported history of abdominal pain did obtain a CT scan.  No clear etiology of nausea or vomiting identified however it did raise concern for fecal impaction.  On rectal exam there was only soft stool in the rectal vault.  Will plan on giving enema.  Also raise concern for possible endoleak however I have low suspicion for this given lack of pain.  Do think if patient tolerates enema well it would be reasonable to discharge with prescription for nausea medication.   FINAL CLINICAL IMPRESSION(S) / ED DIAGNOSES   Final diagnoses:  Nausea and vomiting, unspecified vomiting type     Note:  This document was prepared using Dragon voice recognition software and may include unintentional dictation errors. Phineas Semen, MD 02/24/23 [phone]

## 2023-02-24 NOTE — ED Triage Notes (Signed)
Pt to ED from home woke up and had PT for hip fracture and starting vomiting bile while laying flat. Pt denies recent illness, diarrhea, and weakness.    CBG 141 97.7 4 mg Zofran IM BP 188/80 62 97% RA

## 2023-02-24 NOTE — ED Notes (Signed)
Administered enema, moderate amount of soft stool passed.

## 2023-02-24 NOTE — Discharge Instructions (Signed)
Please seek medical attention for any high fevers, chest pain, shortness of breath, change in behavior, persistent vomiting, bloody stool or any other new or concerning symptoms.  

## 2023-02-24 NOTE — ED Notes (Signed)
Primofit placed by RN

## 2023-02-25 ENCOUNTER — Telehealth: Payer: Self-pay | Admitting: *Deleted

## 2023-02-25 NOTE — Patient Instructions (Signed)
Visit Information  Thank you for taking time to visit with me today. Please don't hesitate to contact me if I can be of assistance to you.   Following are the goals we discussed today:   Goals Addressed             This Visit's Progress    Family requesting information related to nursing home placement  and the PACE program       Care Coordination Interventions:  Fl2 to be requested by provider for long term care placement Fl2 to be faxed out to local facilities once received          Our next appointment is by telephone on 02/28/23 at 10am  Please call the care guide team at [phone] if you need to cancel or reschedule your appointment.   If you are experiencing a Mental Health or Behavioral Health Crisis or need someone to talk to, please call 911   Patient verbalizes understanding of instructions and care plan provided today and agrees to view in MyChart. Active MyChart status and patient understanding of how to access instructions and care plan via MyChart confirmed with patient.     Telephone follow up appointment with care management team member scheduled for: 02/28/23  Verna Czech, LCSW Clinical Social Worker  [facility] Care Management [phone]

## 2023-02-25 NOTE — Patient Outreach (Signed)
  Care Coordination   02/25/2023 Name: Rick Mcbride. MRN: [id number] DOB: 03-Jun-1941   Care Coordination Outreach Attempts:  An unsuccessful telephone outreach was attempted today to offer the patient information about available care coordination services as a benefit of their health plan.   Follow Up Plan:  Additional outreach attempts will be made to offer the patient care coordination information and services.   Encounter Outcome:  No Answer   Care Coordination Interventions:  No, not indicated    Manpreet Kemmer, LCSW Clinical Social Worker  [facility] Care Management [phone]

## 2023-02-25 NOTE — Patient Outreach (Signed)
  Care Coordination   Follow Up Visit Note   02/25/2023 Name: Rick Mcbride. MRN: [id number] DOB: [birth year]  Rick Mcbride. is a 82 y.o. year old male who sees Sparks, Duane Lope, MD for primary care. I spoke with  Rick Mcbride Matters. 's daughter Marcelino Duster by phone today.  What matters to the patients health and wellness today?  Patient's daughter confirms that patient's spouse is unable to care for patient at home. They are now agreeable to long term care placement at this time    Goals Addressed             This Visit's Progress    Family requesting information related to nursing home placement  and the PACE program       Care Coordination Interventions:  Fl2 to be requested by provider for long term care placement Fl2 to be faxed out to local facilities once received          SDOH assessments and interventions completed:  No     Care Coordination Interventions:  Yes, provided  Interventions Today    Flowsheet Row Most Recent Value  Chronic Disease   Chronic disease during today's visit Other  [dementia]  General Interventions   General Interventions Discussed/Reviewed General Interventions Reviewed, Level of Care  Level of Care Skilled Nursing Facility  [Family has now agreed on long term care placement for patient-]  Applications FL-2  [FL2 to be requested by patient's provider]  Education Interventions   Applications FL-2  [FL2 to be requested by patient's provider]       Follow up plan: Follow up call scheduled for 02/28/23    Encounter Outcome:  Pt. Visit Completed

## 2023-02-28 ENCOUNTER — Encounter: Payer: Self-pay | Admitting: *Deleted

## 2023-02-28 DIAGNOSIS — R2681 Unsteadiness on feet: Secondary | ICD-10-CM | POA: Diagnosis not present

## 2023-02-28 DIAGNOSIS — M6281 Muscle weakness (generalized): Secondary | ICD-10-CM | POA: Diagnosis not present

## 2023-02-28 DIAGNOSIS — S72001D Fracture of unspecified part of neck of right femur, subsequent encounter for closed fracture with routine healing: Secondary | ICD-10-CM | POA: Diagnosis not present

## 2023-02-28 DIAGNOSIS — I130 Hypertensive heart and chronic kidney disease with heart failure and stage 1 through stage 4 chronic kidney disease, or unspecified chronic kidney disease: Secondary | ICD-10-CM | POA: Diagnosis not present

## 2023-02-28 DIAGNOSIS — I712 Thoracic aortic aneurysm, without rupture, unspecified: Secondary | ICD-10-CM | POA: Diagnosis not present

## 2023-02-28 DIAGNOSIS — I5032 Chronic diastolic (congestive) heart failure: Secondary | ICD-10-CM | POA: Diagnosis not present

## 2023-02-28 DIAGNOSIS — N184 Chronic kidney disease, stage 4 (severe): Secondary | ICD-10-CM | POA: Diagnosis not present

## 2023-02-28 NOTE — Patient Outreach (Signed)
  Care Coordination   Documentation   Visit Note   02/28/2023 Name: Rick Mcbride. MRN: [id number] DOB: Oct 14, 1941  Rick Mcbride. is a 82 y.o. year old male who sees Sparks, Duane Lope, MD for primary care. I  sent in basket message to patient's provider requesting the Fl2 for placement  What matters to the patients health and wellness today?  Long term care placement    Goals Addressed             This Visit's Progress    Family requesting information related to nursing home placement  and the PACE program       Care Coordination Interventions:  Fl2 to be requested by provider for long term care placement-faxed Fl2 to be faxed out to local facilities once received          SDOH assessments and interventions completed:  No     Care Coordination Interventions:  Yes, provided  Interventions Today    Flowsheet Row Most Recent Value  Chronic Disease   Chronic disease during today's visit Other  [dementia]  General Interventions   General Interventions Discussed/Reviewed Communication with, Level of Care  Communication with PCP/Specialists  [Fl2 requested for long term care placement]  Level of Care Skilled Nursing Facility  [Fl2 to be faxed out to local facilities once received]       Follow up plan: Follow up call scheduled for 426/24    Encounter Outcome:  Pt. Visit Completed

## 2023-03-01 ENCOUNTER — Other Ambulatory Visit: Payer: PPO

## 2023-03-01 DIAGNOSIS — B351 Tinea unguium: Secondary | ICD-10-CM | POA: Diagnosis not present

## 2023-03-01 DIAGNOSIS — M79675 Pain in left toe(s): Secondary | ICD-10-CM | POA: Diagnosis not present

## 2023-03-01 DIAGNOSIS — M79674 Pain in right toe(s): Secondary | ICD-10-CM | POA: Diagnosis not present

## 2023-03-01 DIAGNOSIS — Z515 Encounter for palliative care: Secondary | ICD-10-CM

## 2023-03-01 DIAGNOSIS — R7303 Prediabetes: Secondary | ICD-10-CM | POA: Diagnosis not present

## 2023-03-01 NOTE — Progress Notes (Signed)
PATIENT NAME: Rick Mcbride. DOB: 1941/09/27 MRN: [id number]  PRIMARY CARE PROVIDER: Marguarite Arbour, MD  RESPONSIBLE PARTY:  Acct ID - Guarantor Home Phone Work Phone Relationship Acct Type  [id number] JOVAUGHN, WOJTASZEK* [id number]  Self P/F     [address], [place], [place] [address]    I connected with [name] wife of  [name]. on 03/01/23 by telephone and verified that I am speaking with the correct person using two identifiers.   I discussed the limitations of evaluation and management by telemedicine. The patient expressed understanding and agreed to proceed.   Connected with wife Carney Bern to check on patient status and schedule a home visit.  Carney Bern advised patient was seen in the ED last week and was found to be constipated.  She advised percocet is to much for him to take so she is only using tramadol. Wife advised they have a podiatry appointment this morning and she would need to cut the call short.  I advised her I would call back to schedule a home visit and follow up on his constipation.  Wife agreeable with plan.   Truitt Merle, RN

## 2023-03-02 ENCOUNTER — Telehealth: Payer: Self-pay

## 2023-03-02 NOTE — Telephone Encounter (Signed)
        [facility]   Telephone encounter attempt : 1st   A HIPAA compliant voice message was left requesting a return call.  Instructed patient to call back at [phone].   Akera Snowberger [facility]  [facility] Population Health Community Resource Care Guide   [email or web address]  ?? [phone]   Website: [email or web address]  [email or web address]

## 2023-03-03 ENCOUNTER — Telehealth: Payer: Self-pay

## 2023-03-03 NOTE — Telephone Encounter (Signed)
     Patient  visit on 02/25/2023  at [facility] was for emesis.  Have you been able to follow up with your primary care physician? Yes  The patient was or was not able to obtain any needed medicine or equipment. Patient was able to obtain medication.  Are there diet recommendations that you are having difficulty following? No  Patient expresses understanding of discharge instructions and education provided has no other needs at this time. Yes   Rick Mcbride [facility]  [facility] Population Health Community Resource Care Guide   [email or web address]  ?? [phone]   Website: [email or web address]  [email or web address]

## 2023-03-04 ENCOUNTER — Ambulatory Visit: Payer: Self-pay | Admitting: *Deleted

## 2023-03-04 NOTE — Patient Outreach (Signed)
  Care Coordination   Follow Up Visit Note   03/04/2023 Name: Rick Mcbride. MRN: [id number] DOB: 24-Jun-1941  Rick Mcbride. is a 82 y.o. year old male who sees Sparks, Duane Lope, MD for primary care. I engaged with Rick Mcbride. 's daughter Rick Mcbride today by phone.  What Mcbride to the patients health and wellness today?  Out of home placement being considered at this time    Goals Addressed             This Visit's Progress    Family requesting information related to nursing home placement  and the PACE program       Care Coordination Interventions: Confirmed that family continues to consider options for out of home placement and in home care support  Conf[facility] with the [facility] to assess for eligible resources Confirmed that patient is also being followed by Palliative Care Confirmed that patient's family have been consulting with an attorney regarding Medicaid and long term care placement, Fl2 received-family informed that Fl2 will need to be updated in 30 days          SDOH assessments and interventions completed:  No     Care Coordination Interventions:  Yes, provided  Interventions Today    Flowsheet Row Most Recent Value  Chronic Disease   Chronic disease during today's visit Other  [dementia]  General Interventions   General Interventions Discussed/Reviewed General Interventions Reviewed, Level of Care, Community Resources  Level of Care Public librarian Medicaid, [place]-2  [Patient's daughter informed that [place] has been completed and available-expires in 30 days,  daughter now has an attorney who will also assist with LTC Medicaid application process]  Education Interventions   Applications Medicaid, [place]-2  [Patient's daughter informed that [place] has been completed and available-expires in 30 days,  daughter now has an attorney who will also assist with LTC Medicaid application process]       Follow up plan: Follow up  call scheduled for 03/18/23    Encounter Outcome:  Pt. Visit Completed

## 2023-03-04 NOTE — Patient Instructions (Signed)
Visit Information  Thank you for taking time to visit with me today. Please don't hesitate to contact me if I can be of assistance to you.   Following are the goals we discussed today:   Goals Addressed             This Visit's Progress    Family requesting information related to nursing home placement  and the PACE program       Care Coordination Interventions: Confirmed that family continues to consider options for out of home placement and in home care support  Confirmed appointment scheduled with the [facility] to assess for eligible resources Confirmed that patient is also being followed by Palliative Care Confirmed that patient's family have been consulting with an attorney regarding Medicaid and long term care placement, Fl2 received-family informed that Fl2 will need to be updated in 30 days          Our next appointment is by telephone on 03/18/23 at 11am  Please call the care guide team at [phone] if you need to cancel or reschedule your appointment.   If you are experiencing a Mental Health or Behavioral Health Crisis or need someone to talk to, please call 911   Patient verbalizes understanding of instructions and care plan provided today and agrees to view in MyChart. Active MyChart status and patient understanding of how to access instructions and care plan via MyChart confirmed with patient.     Telephone follow up appointment with care management team member scheduled for: 03/18/23  Verna Czech, LCSW Clinical Social Worker  [facility] Care Management [phone]

## 2023-03-09 DIAGNOSIS — S72001D Fracture of unspecified part of neck of right femur, subsequent encounter for closed fracture with routine healing: Secondary | ICD-10-CM | POA: Diagnosis not present

## 2023-03-09 DIAGNOSIS — N184 Chronic kidney disease, stage 4 (severe): Secondary | ICD-10-CM | POA: Diagnosis not present

## 2023-03-09 DIAGNOSIS — Z87440 Personal history of urinary (tract) infections: Secondary | ICD-10-CM | POA: Diagnosis not present

## 2023-03-09 DIAGNOSIS — R195 Other fecal abnormalities: Secondary | ICD-10-CM | POA: Diagnosis not present

## 2023-03-09 DIAGNOSIS — R112 Nausea with vomiting, unspecified: Secondary | ICD-10-CM | POA: Diagnosis not present

## 2023-03-09 DIAGNOSIS — D509 Iron deficiency anemia, unspecified: Secondary | ICD-10-CM | POA: Diagnosis not present

## 2023-03-09 DIAGNOSIS — R2681 Unsteadiness on feet: Secondary | ICD-10-CM | POA: Diagnosis not present

## 2023-03-09 DIAGNOSIS — K5900 Constipation, unspecified: Secondary | ICD-10-CM | POA: Diagnosis not present

## 2023-03-09 DIAGNOSIS — M6281 Muscle weakness (generalized): Secondary | ICD-10-CM | POA: Diagnosis not present

## 2023-03-09 DIAGNOSIS — I712 Thoracic aortic aneurysm, without rupture, unspecified: Secondary | ICD-10-CM | POA: Diagnosis not present

## 2023-03-09 DIAGNOSIS — J449 Chronic obstructive pulmonary disease, unspecified: Secondary | ICD-10-CM | POA: Diagnosis not present

## 2023-03-09 DIAGNOSIS — I130 Hypertensive heart and chronic kidney disease with heart failure and stage 1 through stage 4 chronic kidney disease, or unspecified chronic kidney disease: Secondary | ICD-10-CM | POA: Diagnosis not present

## 2023-03-09 DIAGNOSIS — I5032 Chronic diastolic (congestive) heart failure: Secondary | ICD-10-CM | POA: Diagnosis not present

## 2023-03-10 DIAGNOSIS — R809 Proteinuria, unspecified: Secondary | ICD-10-CM | POA: Diagnosis not present

## 2023-03-10 DIAGNOSIS — I129 Hypertensive chronic kidney disease with stage 1 through stage 4 chronic kidney disease, or unspecified chronic kidney disease: Secondary | ICD-10-CM | POA: Diagnosis not present

## 2023-03-10 DIAGNOSIS — D631 Anemia in chronic kidney disease: Secondary | ICD-10-CM | POA: Diagnosis not present

## 2023-03-10 DIAGNOSIS — N184 Chronic kidney disease, stage 4 (severe): Secondary | ICD-10-CM | POA: Diagnosis not present

## 2023-03-10 DIAGNOSIS — N2581 Secondary hyperparathyroidism of renal origin: Secondary | ICD-10-CM | POA: Diagnosis not present

## 2023-03-11 ENCOUNTER — Telehealth: Payer: Self-pay

## 2023-03-11 NOTE — Telephone Encounter (Signed)
0454 Palliative Care Note  02/28/22- volunteer attempted to contact pt for palliative care check in. Call went to voicemail.  Attempt #1  Barbette Merino, RN

## 2023-03-14 ENCOUNTER — Ambulatory Visit: Payer: Self-pay | Admitting: *Deleted

## 2023-03-14 ENCOUNTER — Other Ambulatory Visit: Payer: PPO

## 2023-03-14 DIAGNOSIS — Z515 Encounter for palliative care: Secondary | ICD-10-CM

## 2023-03-14 NOTE — Progress Notes (Signed)
COMMUNITY PALLIATIVE CARE SW NOTE  PATIENT NAME: Rick Mcbride's. DOB: 11-Dec-1940 MRN: [id number]  PRIMARY CARE PROVIDER: Marguarite Arbour, MD  RESPONSIBLE PARTY:  Acct ID - Guarantor Home Phone Work Phone Relationship Acct Type  [id number] FIELDER, GENGLER* [phone]  Self P/F     [address], [place], [place] [address]     PLAN OF CARE and INTERVENTIONS:              Palliative care encounter: PC SW completed home visit with patient, patients spouse, daughter and son in law.  Pain: is constant in feet, legs, arms and shoulders. patient is only taking a hlaf of percocet and tramadol.  constipation: had a recent ED visits due being impacted. Patient is taking miralax and colace. Per family patient is now having regular bowel movements.  Medications: no concerns. Patient is taking percocet and tramadol for pain. Patient is taking an ABT amoxicillin 500mg  BID.  sleeping: sleeps quit a bit during the day.   Mobility: patient is mostly ambulatory with WC. Patient is able to pivot transfer with rollator. Family shared that patient had a fall recently trying to ambulate to the restroom in the middle of the night.   ADL: patient is incontinent of B/B at times and wears briefs. Patient requires assistance with bathing and dressing. Patient is able to feed himself.   long term planning: placement options dicussed in detail as well as finances to asssit with placement. Family advised to outreach an elder law attorney to discuss finances in regard to whether or not patient and/or spouse would qualify for SA or LTC Medicaid. Patient continues to have private caregivers 3x/wk. Patient does not qualify for VA Aid & attendance due to not serving during war time.  Hospice: discussed to include criteria, elegibility, services and support. amily does not feel patient is ready for hospice services at this time but are open to it when ready.    Palliative care to continue to follow and will follow up in  3-4 weeks.    SOCIAL HX:  Social History   Tobacco Use   Smoking status: Former    Packs/day: 1.50    Years: 45.00    Additional pack years: 0.00    Total pack years: 67.50    Types: Cigarettes    Quit date: 04/07/2004    Years since quitting: 18.9   Smokeless tobacco: Never  Substance Use Topics   Alcohol use: No    CODE STATUS: DNR ADVANCED DIRECTIVES: Y MOST FORM COMPLETE:  N HOSPICE EDUCATION PROVIDED: Y  PPS: 30%  Time spent: 35 min    [place], [place]

## 2023-03-14 NOTE — Patient Outreach (Signed)
  Care Coordination   Follow Up Visit Note   03/14/2023 Name: Rick Mcbride. MRN: [id number] DOB: 02/02/1941  Rick Mcbride. is a 82 y.o. year old male who sees Sparks, Duane Lope, MD for primary care. I spoke with Wife and daughter of  Rick Mcbride. by phone today.  What matters to the patients health and wellness today?  Getting adequate help in the home to care for patient.  CSW working with family.     Goals Addressed             This Visit's Progress    Consistent in home assistance to help manage chronic medical conditions       Care Coordination Interventions: Evaluation of current treatment plan related to CHF and HTN and patient's adherence to plan as established by provider Advised patient to review paperwork completed for assistance, looking to see if it is VA aide and attendance Provided education to patient re: resources in the community that may offer support for incontinence supplies (Elder Care) Reviewed medications with patient and discussed adherence and affordability Reviewed scheduled/upcoming provider appointments including ortho on 6/28 Discussed plans with patient for ongoing care management follow up and provided patient with direct contact information for care management team         SDOH assessments and interventions completed:  No     Care Coordination Interventions:  Yes, provided   Interventions Today    Flowsheet Row Most Recent Value  Chronic Disease   Chronic disease during today's visit Other  [dementia]  General Interventions   General Interventions Discussed/Reviewed Doctor Visits, General Interventions Reviewed  Doctor Visits Discussed/Reviewed Doctor Visits Reviewed, PCP, Specialist  PCP/Specialist Visits Compliance with follow-up visit  Education Interventions   Education Provided Provided Education  Provided Verbal Education On When to see the doctor, Community Resources       Follow up plan: Follow up call  scheduled for 6/17    Encounter Outcome:  Pt. Visit Completed   Kemper Durie, RN, MSN, [facility] Care Management Care Management Coordinator [phone]

## 2023-03-18 ENCOUNTER — Ambulatory Visit: Payer: Self-pay | Admitting: *Deleted

## 2023-03-18 NOTE — Patient Instructions (Signed)
Visit Information  Thank you for taking time to visit with me today. Please don't hesitate to contact me if I can be of assistance to you.   Following are the goals we discussed today:   Goals Addressed             This Visit's Progress    Family requesting information related to nursing home placement  and the PACE program       Care Coordination Interventions: Confirmed that family continues to consider options for out of home placement-Fl2 obtained to be faxed out to local facilities once LTC medicaid is applied for Confirmed that patient is not eligible for VA Aid and Attendance Confirmed that patient is also being followed by Palliative Care Confirmed that patient's family continue to consult with an attorney regarding application for long term care Medicaid           Our next appointment is by telephone on 03/23/23 at 11am  Please call the care guide team at [phone] if you need to cancel or reschedule your appointment.   If you are experiencing a Mental Health or Behavioral Health Crisis or need someone to talk to, please call 911   Patient verbalizes understanding of instructions and care plan provided today and agrees to view in MyChart. Active MyChart status and patient understanding of how to access instructions and care plan via MyChart confirmed with patient.     Telephone follow up appointment with care management team member scheduled for: 03/23/23  Verna Czech, LCSW Clinical Social Worker  [facility] Care Management [phone]

## 2023-03-18 NOTE — Patient Outreach (Signed)
  Care Coordination   Follow Up Visit Note   03/18/2023 Name: Rick Mcbride. MRN: [id number] DOB: [birth year]  Rick Mcbride. is a 82 y.o. year old male who sees Sparks, Duane Lope, MD for primary care. I spoke with  Donnie Mesa Jr.'s daughter by phone today.  What matters to the patients health and wellness today?  Patient's family have made decision for memory care placement for patient  .   Goals Addressed             This Visit's Progress    Family requesting information related to nursing home placement  and the PACE program       Care Coordination Interventions: Confirmed that family continues to consider options for out of home placement-Fl2 obtained to be faxed out to local facilities once LTC medicaid is applied for Confirmed that patient is not eligible for VA Aid and Attendance Confirmed that patient is also being followed by Palliative Care Confirmed that patient's family continue to consult with an attorney regarding application for long term care Medicaid           SDOH assessments and interventions completed:  No     Care Coordination Interventions:  Yes, provided   Interventions Today    Flowsheet Row Most Recent Value  Chronic Disease   Chronic disease during today's visit Other  [dementia]  General Interventions   General Interventions Discussed/Reviewed General Interventions Reviewed, Level of Care  Level of Care Skilled Nursing Facility, Applications  [patient's daughter would like to now pursue skilled care for patient-would like placement in [place] 1st choice-discussed need to complete LTC nmedicaid on patient's behalf before placement can occur]  Applications Medicaid  [patient's daughter continues to work with a elder law attorney on LT care medicaid]  Education Interventions   Applications Medicaid  [patient's daughter continues to work with a elder law attorney on LT care medicaid]        Follow up plan:  Follow up call scheduled for 03/23/23    Encounter Outcome:  Pt. Visit Completed

## 2023-03-23 ENCOUNTER — Telehealth: Payer: Self-pay | Admitting: *Deleted

## 2023-03-23 ENCOUNTER — Encounter: Payer: Self-pay | Admitting: *Deleted

## 2023-03-23 NOTE — Patient Instructions (Signed)
Visit Information  Thank you for taking time to visit with me today. Please don't hesitate to contact me if I can be of assistance to you.   Following are the goals we discussed today:   Goals Addressed             This Visit's Progress    Family requesting information related to nursing home placement  and the PACE program       Care Coordination Interventions: Confirmed that family continues to consider options for out of home placement-Fl2 obtained to be faxed out to local facilities once LTC medicaid is in place Confirmed that patient's family continue to consult with an elder law attorney regarding application for long term care Medicaid           Our next appointment is by telephone on 04/06/23 at 10am  Please call the care guide team at [phone] if you need to cancel or reschedule your appointment.   If you are experiencing a Mental Health or Behavioral Health Crisis or need someone to talk to, please call 911   Patient verbalizes understanding of instructions and care plan provided today and agrees to view in MyChart. Active MyChart status and patient understanding of how to access instructions and care plan via MyChart confirmed with patient.     Telephone follow up appointment with care management team member scheduled for: 04/06/23  Verna Czech, LCSW Clinical Social Worker  [facility] Care Management [phone]

## 2023-03-23 NOTE — Patient Outreach (Signed)
  Care Coordination   Follow Up Visit Note   03/23/2023 Name: Rick Mcbride. MRN: [id number] DOB: 06-29-1941  Rick T Meade Edd. is a 82 y.o. year old male who sees Sparks, Duane Lope, MD for primary care. I spoke with  Rick Mcbride Matters. 's daughter by phone today.  What matters to the patients health and wellness today?  Long term care placement    Goals Addressed             This Visit's Progress    Family requesting information related to nursing home placement  and the PACE program       Care Coordination Interventions: Confirmed that family continues to consider options for out of home placement-Fl2 obtained to be faxed out to local facilities once LTC medicaid is in place Confirmed that patient's family continue to consult with an elder law attorney regarding application for long term care Medicaid           SDOH assessments and interventions completed:  No     Care Coordination Interventions:  Yes, provided  Interventions Today    Flowsheet Row Most Recent Value  Chronic Disease   Chronic disease during today's visit Other  [dementia]  General Interventions   General Interventions Discussed/Reviewed General Interventions Reviewed, Level of Care, Communication with  Communication with --  [Admission Director-skilled care to discuss LTC medicaid bed availability]  Level of Care Skilled Nursing Facility  [[facility] contacted-10 year waiting list, [name] and [facility] will need LTC medicaid in place before patient can be considered for long term care stay]  Applications Medicaid  [patient's daughter working with elder law attorney who is assisting with LTC medicaid application process]  Education Interventions   Education Provided Provided Education  Provided Verbal Education On [employer] provided on LTC placement process]  [employer]  [patient's daughter working with elder law attorney who is assisting with  LTC medicaid application process]       Follow up plan: Follow up call scheduled for 04/06/23    Encounter Outcome:  Pt. Visit Completed

## 2023-04-06 ENCOUNTER — Encounter: Payer: Self-pay | Admitting: *Deleted

## 2023-04-06 ENCOUNTER — Telehealth: Payer: Self-pay | Admitting: *Deleted

## 2023-04-06 NOTE — Patient Outreach (Signed)
  Care Coordination   04/06/2023 Name: Rick Mcbride. MRN: [id number] DOB: 1941-05-31   Care Coordination Outreach Attempts:  An unsuccessful telephone outreach was attempted today to offer the patient information about available care coordination services.  Follow Up Plan:  Additional outreach attempts will be made to offer the patient care coordination information and services.   Encounter Outcome:  No Answer   Care Coordination Interventions:  No, not indicated    Maripaz Mullan, LCSW Clinical Social Worker  [facility] Care Management [phone]

## 2023-04-20 ENCOUNTER — Encounter: Payer: Self-pay | Admitting: *Deleted

## 2023-04-20 NOTE — Patient Outreach (Signed)
  Care Coordination   Documentaion  Visit Note   04/20/2023 Name: Irene Mitcham. MRN: [id number] DOB: 06-29-1941  Jace T Marvell Tamer. is a 82 y.o. year old male who sees Sparks, Duane Lope, MD for primary care. I  received communication from [facility], patient has transitioned to Hospice Care  What matters to the patients health and wellness today?  Message received from patient's daughter from the concierge line regarding update and missed last call.  Patient has been transitioned to Hospice and is no longer need nursing or social services    Goals Addressed             This Visit's Progress    COMPLETED: Family requesting information related to nursing home placement  and the PACE program       Interventions Today    Flowsheet Row Most Recent Value  General Interventions   General Interventions Discussed/Reviewed Communication with  Communication with --  [Communication received from Careguide-received message from the concierge line regarding update and missed last call.  Patient now working with Hospice no longer need nursing or social services]              SDOH assessments and interventions completed:  No     Care Coordination Interventions:  Yes, provided   Follow up plan: No further intervention required.   Encounter Outcome:  Pt. Visit Completed

## 2023-04-25 ENCOUNTER — Ambulatory Visit: Payer: Self-pay | Admitting: *Deleted

## 2023-04-25 NOTE — Patient Outreach (Signed)
  Care Coordination   Follow Up Visit Note   04/25/2023 Name: Rick Mcbride. MRN: [id number] DOB: 1940/12/11  Rick Mcbride. is a 82 y.o. year old male who sees Sparks, Duane Lope, MD for primary care.  What matters to the patients health and wellness today?  Case closed, patient now with hospice    Goals Addressed             This Visit's Progress    COMPLETED: Consistent in home assistance to help manage chronic medical conditions       Care Coordination Interventions: Evaluation of current treatment plan related to CHF and HTN and patient's adherence to plan as established by provider Advised patient to review paperwork completed for assistance, looking to see if it is VA aide and attendance Provided education to patient re: resources in the community that may offer support for incontinence supplies (Elder Care) Reviewed medications with patient and discussed adherence and affordability Reviewed scheduled/upcoming provider appointments including ortho on 6/28 Discussed plans with patient for ongoing care management follow up and provided patient with direct contact information for care management team  6/17 Goals met - hospice        SDOH assessments and interventions completed:  No     Care Coordination Interventions:  No, not indicated   Follow up plan: No further intervention required.   Encounter Outcome:  Pt. Visit Completed   Kemper Durie, RN, MSN, [facility] Care Management Care Management Coordinator [phone]

## 2023-05-02 DIAGNOSIS — R627 Adult failure to thrive: Secondary | ICD-10-CM | POA: Diagnosis not present

## 2023-05-02 DIAGNOSIS — G311 Senile degeneration of brain, not elsewhere classified: Secondary | ICD-10-CM | POA: Diagnosis not present

## 2023-05-02 DIAGNOSIS — R1310 Dysphagia, unspecified: Secondary | ICD-10-CM | POA: Diagnosis not present

## 2023-05-02 DIAGNOSIS — Z741 Need for assistance with personal care: Secondary | ICD-10-CM | POA: Diagnosis not present

## 2023-05-02 DIAGNOSIS — I959 Hypotension, unspecified: Secondary | ICD-10-CM | POA: Diagnosis not present

## 2023-05-02 DIAGNOSIS — F0280 Dementia in other diseases classified elsewhere without behavioral disturbance: Secondary | ICD-10-CM | POA: Diagnosis not present

## 2023-05-13 DIAGNOSIS — I119 Hypertensive heart disease without heart failure: Secondary | ICD-10-CM | POA: Diagnosis not present

## 2023-05-13 DIAGNOSIS — J449 Chronic obstructive pulmonary disease, unspecified: Secondary | ICD-10-CM | POA: Diagnosis not present

## 2023-05-13 DIAGNOSIS — G8929 Other chronic pain: Secondary | ICD-10-CM | POA: Diagnosis not present

## 2023-05-13 DIAGNOSIS — R1310 Dysphagia, unspecified: Secondary | ICD-10-CM | POA: Diagnosis not present

## 2023-06-06 DIAGNOSIS — G8929 Other chronic pain: Secondary | ICD-10-CM | POA: Diagnosis not present

## 2023-06-06 DIAGNOSIS — J449 Chronic obstructive pulmonary disease, unspecified: Secondary | ICD-10-CM | POA: Diagnosis not present

## 2023-06-06 DIAGNOSIS — F03B0 Unspecified dementia, moderate, without behavioral disturbance, psychotic disturbance, mood disturbance, and anxiety: Secondary | ICD-10-CM | POA: Diagnosis not present

## 2023-06-06 DIAGNOSIS — I119 Hypertensive heart disease without heart failure: Secondary | ICD-10-CM | POA: Diagnosis not present

## 2023-06-28 DIAGNOSIS — R627 Adult failure to thrive: Secondary | ICD-10-CM | POA: Diagnosis not present

## 2023-06-28 DIAGNOSIS — F0280 Dementia in other diseases classified elsewhere without behavioral disturbance: Secondary | ICD-10-CM | POA: Diagnosis not present

## 2023-06-28 DIAGNOSIS — G311 Senile degeneration of brain, not elsewhere classified: Secondary | ICD-10-CM | POA: Diagnosis not present

## 2023-06-28 DIAGNOSIS — R1310 Dysphagia, unspecified: Secondary | ICD-10-CM | POA: Diagnosis not present

## 2023-08-01 DIAGNOSIS — J449 Chronic obstructive pulmonary disease, unspecified: Secondary | ICD-10-CM | POA: Diagnosis not present

## 2023-08-01 DIAGNOSIS — F0280 Dementia in other diseases classified elsewhere without behavioral disturbance: Secondary | ICD-10-CM | POA: Diagnosis not present

## 2023-08-01 DIAGNOSIS — I119 Hypertensive heart disease without heart failure: Secondary | ICD-10-CM | POA: Diagnosis not present

## 2023-08-01 DIAGNOSIS — G8929 Other chronic pain: Secondary | ICD-10-CM | POA: Diagnosis not present

## 2023-12-10 DEATH — deceased
